# Patient Record
Sex: Female | Born: 1950 | Race: White | Hispanic: No | Marital: Married | Smoking: Never smoker
Health system: Southern US, Community
[De-identification: ages and names within clinical notes are randomized; demographics above are authoritative.]

## PROBLEM LIST (undated history)

## (undated) DIAGNOSIS — N200 Calculus of kidney: Secondary | ICD-10-CM

## (undated) DIAGNOSIS — J45909 Unspecified asthma, uncomplicated: Secondary | ICD-10-CM

## (undated) DIAGNOSIS — E039 Hypothyroidism, unspecified: Secondary | ICD-10-CM

## (undated) DIAGNOSIS — Z8709 Personal history of other diseases of the respiratory system: Secondary | ICD-10-CM

## (undated) DIAGNOSIS — J189 Pneumonia, unspecified organism: Secondary | ICD-10-CM

## (undated) DIAGNOSIS — Z8701 Personal history of pneumonia (recurrent): Secondary | ICD-10-CM

## (undated) DIAGNOSIS — F32A Depression, unspecified: Secondary | ICD-10-CM

## (undated) DIAGNOSIS — H409 Unspecified glaucoma: Secondary | ICD-10-CM

## (undated) DIAGNOSIS — E669 Obesity, unspecified: Secondary | ICD-10-CM

## (undated) DIAGNOSIS — K635 Polyp of colon: Secondary | ICD-10-CM

## (undated) DIAGNOSIS — M1990 Unspecified osteoarthritis, unspecified site: Secondary | ICD-10-CM

## (undated) DIAGNOSIS — Z8489 Family history of other specified conditions: Secondary | ICD-10-CM

## (undated) DIAGNOSIS — G2581 Restless legs syndrome: Secondary | ICD-10-CM

## (undated) DIAGNOSIS — R112 Nausea with vomiting, unspecified: Secondary | ICD-10-CM

## (undated) DIAGNOSIS — Z9989 Dependence on other enabling machines and devices: Secondary | ICD-10-CM

## (undated) DIAGNOSIS — F329 Major depressive disorder, single episode, unspecified: Secondary | ICD-10-CM

## (undated) DIAGNOSIS — E119 Type 2 diabetes mellitus without complications: Secondary | ICD-10-CM

## (undated) DIAGNOSIS — I10 Essential (primary) hypertension: Secondary | ICD-10-CM

## (undated) DIAGNOSIS — R51 Headache: Secondary | ICD-10-CM

## (undated) DIAGNOSIS — K760 Fatty (change of) liver, not elsewhere classified: Secondary | ICD-10-CM

## (undated) DIAGNOSIS — Z9889 Other specified postprocedural states: Secondary | ICD-10-CM

## (undated) DIAGNOSIS — M069 Rheumatoid arthritis, unspecified: Secondary | ICD-10-CM

## (undated) DIAGNOSIS — G4733 Obstructive sleep apnea (adult) (pediatric): Secondary | ICD-10-CM

## (undated) DIAGNOSIS — F419 Anxiety disorder, unspecified: Secondary | ICD-10-CM

## (undated) DIAGNOSIS — M722 Plantar fascial fibromatosis: Secondary | ICD-10-CM

## (undated) DIAGNOSIS — Z9109 Other allergy status, other than to drugs and biological substances: Secondary | ICD-10-CM

## (undated) DIAGNOSIS — Z87442 Personal history of urinary calculi: Secondary | ICD-10-CM

## (undated) DIAGNOSIS — G43909 Migraine, unspecified, not intractable, without status migrainosus: Secondary | ICD-10-CM

## (undated) HISTORY — DX: Unspecified osteoarthritis, unspecified site: M19.90

## (undated) HISTORY — DX: Headache: R51

## (undated) HISTORY — DX: Polyp of colon: K63.5

## (undated) HISTORY — DX: Dependence on other enabling machines and devices: Z99.89

## (undated) HISTORY — PX: BREAST BIOPSY: SHX20

## (undated) HISTORY — DX: Restless legs syndrome: G25.81

## (undated) HISTORY — DX: Unspecified glaucoma: H40.9

## (undated) HISTORY — PX: BUNIONECTOMY: SHX129

## (undated) HISTORY — PX: COLONOSCOPY W/ POLYPECTOMY: SHX1380

## (undated) HISTORY — DX: Plantar fascial fibromatosis: M72.2

## (undated) HISTORY — PX: SHOULDER SURGERY: SHX246

## (undated) HISTORY — PX: APPENDECTOMY: SHX54

## (undated) HISTORY — DX: Calculus of kidney: N20.0

## (undated) HISTORY — DX: Obstructive sleep apnea (adult) (pediatric): G47.33

## (undated) HISTORY — DX: Other allergy status, other than to drugs and biological substances: Z91.09

## (undated) HISTORY — PX: FINGER SURGERY: SHX640

## (undated) HISTORY — PX: LASIK: SHX215

## (undated) HISTORY — DX: Obesity, unspecified: E66.9

## (undated) HISTORY — PX: ABDOMINAL HYSTERECTOMY: SHX81

## (undated) HISTORY — PX: FOOT SURGERY: SHX648

## (undated) HISTORY — PX: KNEE ARTHROSCOPY W/ MENISCAL REPAIR: SHX1877

## (undated) HISTORY — PX: OTHER SURGICAL HISTORY: SHX169

## (undated) HISTORY — PX: TONSILLECTOMY: SUR1361

---

## 2005-01-07 ENCOUNTER — Ambulatory Visit: Payer: Self-pay | Admitting: General Practice

## 2005-02-27 ENCOUNTER — Ambulatory Visit: Payer: Self-pay | Admitting: General Surgery

## 2005-09-17 ENCOUNTER — Ambulatory Visit: Payer: Self-pay

## 2005-11-09 ENCOUNTER — Emergency Department: Payer: Self-pay | Admitting: Emergency Medicine

## 2006-01-14 ENCOUNTER — Ambulatory Visit: Payer: Self-pay | Admitting: Gastroenterology

## 2006-04-02 ENCOUNTER — Ambulatory Visit: Payer: Self-pay | Admitting: Internal Medicine

## 2007-04-06 ENCOUNTER — Ambulatory Visit: Payer: Self-pay | Admitting: General Surgery

## 2008-05-10 ENCOUNTER — Ambulatory Visit: Payer: Self-pay | Admitting: General Surgery

## 2008-05-29 ENCOUNTER — Ambulatory Visit: Payer: Self-pay | Admitting: General Surgery

## 2008-06-13 ENCOUNTER — Ambulatory Visit: Payer: Self-pay | Admitting: Specialist

## 2009-02-28 ENCOUNTER — Ambulatory Visit: Payer: Self-pay

## 2009-05-25 ENCOUNTER — Ambulatory Visit: Payer: Self-pay | Admitting: General Practice

## 2009-05-25 ENCOUNTER — Ambulatory Visit: Payer: Self-pay | Admitting: Cardiovascular Disease

## 2009-06-08 ENCOUNTER — Ambulatory Visit: Payer: Self-pay | Admitting: General Practice

## 2009-06-20 ENCOUNTER — Encounter: Payer: Self-pay | Admitting: General Practice

## 2009-07-03 ENCOUNTER — Encounter: Payer: Self-pay | Admitting: General Practice

## 2009-08-03 ENCOUNTER — Encounter: Payer: Self-pay | Admitting: General Practice

## 2009-08-27 ENCOUNTER — Ambulatory Visit: Payer: Self-pay | Admitting: Internal Medicine

## 2009-09-02 ENCOUNTER — Encounter: Payer: Self-pay | Admitting: General Practice

## 2009-10-17 ENCOUNTER — Ambulatory Visit: Payer: Self-pay | Admitting: General Surgery

## 2010-12-02 ENCOUNTER — Ambulatory Visit: Payer: Self-pay | Admitting: Internal Medicine

## 2010-12-04 ENCOUNTER — Ambulatory Visit: Payer: Self-pay | Admitting: General Surgery

## 2011-01-30 ENCOUNTER — Ambulatory Visit: Payer: Self-pay | Admitting: Internal Medicine

## 2011-02-13 ENCOUNTER — Ambulatory Visit: Payer: Self-pay | Admitting: General Practice

## 2011-02-20 ENCOUNTER — Ambulatory Visit: Payer: Self-pay | Admitting: General Practice

## 2011-02-26 ENCOUNTER — Ambulatory Visit: Payer: Self-pay | Admitting: Physician Assistant

## 2011-08-01 ENCOUNTER — Observation Stay: Payer: Self-pay | Admitting: Internal Medicine

## 2011-08-01 LAB — CBC
HGB: 14.6 g/dL (ref 12.0–16.0)
MCHC: 34 g/dL (ref 32.0–36.0)
Platelet: 175 10*3/uL (ref 150–440)
RBC: 4.67 10*6/uL (ref 3.80–5.20)
WBC: 7.4 10*3/uL (ref 3.6–11.0)

## 2011-08-01 LAB — CK TOTAL AND CKMB (NOT AT ARMC)
CK, Total: 58 U/L (ref 21–215)
CK-MB: 0.6 ng/mL (ref 0.5–3.6)

## 2011-08-01 LAB — COMPREHENSIVE METABOLIC PANEL
BUN: 9 mg/dL (ref 7–18)
Bilirubin,Total: 0.4 mg/dL (ref 0.2–1.0)
Chloride: 107 mmol/L (ref 98–107)
Creatinine: 0.61 mg/dL (ref 0.60–1.30)
EGFR (African American): >60
Glucose: 90 mg/dL (ref 65–99)
Osmolality: 285 (ref 275–301)
SGOT(AST): 47 U/L — ABNORMAL HIGH (ref 15–37)
SGPT (ALT): 69 U/L
Sodium: 144 mmol/L (ref 136–145)

## 2011-08-01 LAB — CK-MB: CK-MB: <0.5 ng/mL — ABNORMAL LOW (ref 0.5–3.6)

## 2011-08-02 LAB — LIPID PANEL
Cholesterol: 225 mg/dL — ABNORMAL HIGH (ref 0–200)
Ldl Cholesterol, Calc: 159 mg/dL — ABNORMAL HIGH (ref 0–100)
Triglycerides: 171 mg/dL (ref 0–200)

## 2011-08-02 LAB — TROPONIN I: Troponin-I: <0.02 ng/mL

## 2011-08-02 LAB — CK-MB: CK-MB: <0.5 ng/mL — ABNORMAL LOW (ref 0.5–3.6)

## 2011-11-18 ENCOUNTER — Ambulatory Visit: Payer: Self-pay | Admitting: General Practice

## 2011-12-09 ENCOUNTER — Ambulatory Visit: Payer: Self-pay | Admitting: General Surgery

## 2012-01-19 ENCOUNTER — Ambulatory Visit: Payer: Self-pay | Admitting: Gastroenterology

## 2012-03-18 ENCOUNTER — Ambulatory Visit: Payer: Self-pay | Admitting: Podiatry

## 2012-03-26 ENCOUNTER — Ambulatory Visit: Payer: Self-pay | Admitting: Gastroenterology

## 2012-08-10 ENCOUNTER — Telehealth: Payer: Self-pay | Admitting: *Deleted

## 2012-08-10 MED ORDER — PREDNISONE 5 MG PO TABS: ORAL_TABLET | ORAL | Status: DC

## 2012-08-10 MED ORDER — HYDROCODONE-IBUPROFEN 7.5-200 MG PO TABS: 1.0000 | ORAL_TABLET | Freq: Four times a day (QID) | ORAL | Status: DC | PRN

## 2012-08-10 NOTE — Telephone Encounter (Signed)
I called patient. The patient has had a headache for about 4 days. I'll give her a prednisone Dosepak, and a refill on her Vicoprofen.

## 2012-08-10 NOTE — Telephone Encounter (Signed)
Patient called stating she has a migraine that has lasted for three days and would like something called into her pharmacy.

## 2012-09-01 ENCOUNTER — Telehealth: Payer: Self-pay

## 2012-09-01 ENCOUNTER — Encounter: Payer: Self-pay | Admitting: Podiatry

## 2012-09-01 MED ORDER — ELETRIPTAN HYDROBROMIDE 40 MG PO TABS: 40.0000 mg | ORAL_TABLET | Freq: Two times a day (BID) | ORAL | Status: DC | PRN

## 2012-09-01 NOTE — Telephone Encounter (Signed)
The patient has been on Relpax in the past. I'll be happy to call in a prescription for her. The patient is being followed for migraine.

## 2012-09-01 NOTE — Telephone Encounter (Signed)
Patient wants to know if provider would like her to continue to take Relpax.  We last prescribed this medication in 2012.  If she should still take it, she would like rx sent to the pharmacy.  Thank you.

## 2012-09-02 ENCOUNTER — Encounter: Payer: Self-pay | Admitting: Podiatry

## 2012-10-03 ENCOUNTER — Encounter: Payer: Self-pay | Admitting: Podiatry

## 2012-10-12 ENCOUNTER — Other Ambulatory Visit: Payer: Self-pay | Admitting: Nurse Practitioner

## 2012-10-12 MED ORDER — BUTORPHANOL TARTRATE 10 MG/ML NA SOLN: 1.0000 | Freq: Four times a day (QID) | NASAL | Status: DC | PRN

## 2012-10-12 NOTE — Telephone Encounter (Signed)
Patient is calling to tell us she's ran out of her medication. She spells it Butorphanol tart rate.  Patient would like a call back asap.  [phone]

## 2012-10-13 ENCOUNTER — Ambulatory Visit: Payer: Self-pay | Admitting: Urology

## 2012-11-02 ENCOUNTER — Encounter: Payer: Self-pay | Admitting: Podiatry

## 2012-12-21 ENCOUNTER — Ambulatory Visit: Payer: Self-pay | Admitting: Internal Medicine

## 2012-12-24 ENCOUNTER — Ambulatory Visit: Payer: Self-pay | Admitting: Internal Medicine

## 2012-12-28 ENCOUNTER — Ambulatory Visit: Payer: Self-pay | Admitting: Internal Medicine

## 2013-02-15 ENCOUNTER — Telehealth: Payer: Self-pay | Admitting: Neurology

## 2013-02-15 ENCOUNTER — Encounter: Payer: Self-pay | Admitting: Nurse Practitioner

## 2013-02-15 MED ORDER — ELETRIPTAN HYDROBROMIDE 40 MG PO TABS: 40.0000 mg | ORAL_TABLET | Freq: Two times a day (BID) | ORAL | Status: DC | PRN

## 2013-02-15 NOTE — Telephone Encounter (Signed)
Rx Sent  

## 2013-02-18 ENCOUNTER — Encounter: Payer: Self-pay | Admitting: Nurse Practitioner

## 2013-02-18 ENCOUNTER — Ambulatory Visit (INDEPENDENT_AMBULATORY_CARE_PROVIDER_SITE_OTHER): Payer: PRIVATE HEALTH INSURANCE | Admitting: Nurse Practitioner

## 2013-02-18 ENCOUNTER — Encounter (INDEPENDENT_AMBULATORY_CARE_PROVIDER_SITE_OTHER): Payer: Self-pay

## 2013-02-18 VITALS — BP 150/88 | HR 74 | Ht 61.0 in | Wt 206.0 lb

## 2013-02-18 DIAGNOSIS — G43019 Migraine without aura, intractable, without status migrainosus: Secondary | ICD-10-CM | POA: Insufficient documentation

## 2013-02-18 MED ORDER — BUTORPHANOL TARTRATE 10 MG/ML NA SOLN: 1.0000 | Freq: Four times a day (QID) | NASAL | Status: DC | PRN

## 2013-02-18 NOTE — Patient Instructions (Signed)
Given informational migraine triggers Gabapentin 4 times daily to continue Stadol renewed, patient does not use Relpax is effective Followup in 6-8 months

## 2013-02-18 NOTE — Progress Notes (Signed)
I have read the note, and I agree with the clinical assessment and plan.  WILLIS,CHARLES KEITH   

## 2013-02-18 NOTE — Progress Notes (Signed)
[facility]  PATIENT: Jessica Cole DOB: 10/14/1950   REASON FOR VISIT:headaches   HISTORY OF PRESENT ILLNESS:Jessica Cole, 62 year old  right-handed white female with a history of obesity, diabetes, and migraine headaches returns for followup.  The patient indicates that she has less stress in her life since she has retired, and her headache frequency and severity has improved. The patient will have 2 or 3 headaches a month, and the headaches may last several hours, but she usually can improve the headache with hydrocodone and Stadol. The patient indicates that Relpax causes generalized achiness, and she generally does not use this medication. Imitrex also caused side effects. The patient indicates that she went off of her allergy medications, and her headaches worsened. She has never had allergy testing . The patient continues to have some issues with sleep, and she takes trazodone at night. The patient returns for an evaluation.  REVIEW OF SYSTEMS: Full 14 system review of systems performed and notable only for:  Constitutional: fatigue Cardiovascular: N/A  Ear/Nose/Throat: N/A  Skin: Easy bruising Eyes: N/A  Respiratory: N/A  Gastroitestinal: N/A  Hematology/Lymphatic: N/A  Endocrine: N/A Musculoskeletal:N/A  Allergy/Immunology: Allergies Neurological: Headache Psychiatric: Anxiety, decreased energy  ALLERGIES: Allergies  Allergen Reactions  . Seroquel [Quetiapine Fumarate]     HOME MEDICATIONS: Outpatient Prescriptions Prior to Visit  Medication Sig Dispense Refill  . aspirin 81 MG tablet Take 81 mg by mouth 2 (two) times daily.      . butorphanol (STADOL) 10 MG/ML nasal spray Place 1 spray into the nose every 6 (six) hours as needed (MUST LAST 28 DAYS).  5 mL  1  . calcium gluconate 500 MG tablet Take 500 mg by mouth daily.      . diclofenac sodium (VOLTAREN) 1 % GEL Apply topically 2 (two) times daily.      Marland Kitchen eletriptan (RELPAX) 40 MG tablet Take 1  tablet (40 mg total) by mouth 2 (two) times daily as needed for migraine.  8 tablet  0  . enalapril (VASOTEC) 5 MG tablet Take 5 mg by mouth daily.      Marland Kitchen estradiol (VIVELLE-DOT) 0.025 MG/24HR Place 1 patch onto the skin 2 (two) times a week.      . gabapentin (NEURONTIN) 300 MG capsule Take 300 mg by mouth 4 (four) times daily.      Marland Kitchen HYDROcodone-ibuprofen (VICOPROFEN) 7.5-200 MG per tablet Take 1 tablet by mouth every 6 (six) hours as needed for pain (Months last 28 days.).  40 tablet  3  . latanoprost (XALATAN) 0.005 % ophthalmic solution 1 drop at bedtime.      Marland Kitchen levothyroxine (SYNTHROID, LEVOTHROID) 125 MCG tablet Take 125 mcg by mouth daily before breakfast.      . Liraglutide (VICTOZA) 18 MG/3ML SOPN Inject 1.8 % into the skin daily.      . meloxicam (MOBIC) 15 MG tablet Take 15 mg by mouth as needed for pain.      Marland Kitchen nystatin (MYCOSTATIN/NYSTOP) 100000 UNIT/GM POWD Apply topically.      . Omega-3 Fatty Acids (FISH OIL ULTRA) 1000 MG CAPS Take 2 each by mouth daily.      . predniSONE (DELTASONE) 5 MG tablet Begin 6 tablets daily, taper by one tablet every day until off  21 tablet  0  . promethazine (PHENERGAN) 25 MG tablet Take 25 mg by mouth every 6 (six) hours as needed for nausea.      . Riboflavin 400 MG TABS 400 mg by  Intrauterine route daily.       . traZODone (DESYREL) 100 MG tablet Take 100 mg by mouth at bedtime.      . Triamcinolone Acetonide 55 MCG/ACT AERO Place 2 sprays into the nose.      . fexofenadine (ALLER-EASE) 180 MG tablet Take 180 mg by mouth at bedtime.      . Multiple Vitamin (MULTIVITAMIN) tablet Take 1 tablet by mouth daily.       No facility-administered medications prior to visit.    PAST MEDICAL HISTORY: Past Medical History  Diagnosis Date  . Headache(784.0)     PAST SURGICAL HISTORY: History reviewed. No pertinent past surgical history.  FAMILY HISTORY: Family History  Problem Relation Age of Onset  . Lung cancer Mother   . Congestive Heart  Failure Father   . Depression Sister   . Headache Maternal Grandfather   . Headache Maternal Uncle   . Diabetes    . Heart disease    . Hypertension      SOCIAL HISTORY: History   Social History  . Marital Status: Married    Spouse Name: N/A    Number of Children: 0  . Years of Education: 12   Occupational History  . Not on file.   Social History Main Topics  . Smoking status: Never Smoker   . Smokeless tobacco: Never Used  . Alcohol Use: No  . Drug Use: No  . Sexual Activity: Not on file   Other Topics Concern  . Not on file   Social History Narrative   Patient is married and lives with her husband.   Patient has a high school education.   Patient has no children.    Patient works for the [employer] Dept     PHYSICAL EXAM  Filed Vitals:   02/18/13 1402  Height: 5\' 1"  (1.549 m)  Weight: 206 lb (93.441 kg)   Body mass index is 38.94 kg/(m^2).  Generalized: Well developed, obese female in no acute distress  Head: normocephalic and atraumatic,. Oropharynx benign  Neck: Supple, no carotid bruits  Cardiac: Regular rate rhythm, no murmur  Neurological examination   Mentation: Alert oriented to time, place, history taking. Follows all commands speech and language fluent  Cranial nerve II-XII: Pupils were equal round reactive to light extraocular movements were full, visual field were full on confrontational test. Facial sensation and strength were normal. hearing was intact to finger rubbing bilaterally. Uvula tongue midline. head turning and shoulder shrug and were normal and symmetric.Tongue protrusion into cheek strength was normal. Motor: normal bulk and tone, full strength in the BUE, BLE, fine finger movements normal, no pronator drift. No focal weakness Coordination: finger-nose-finger, heel-to-shin bilaterally, no dysmetria Reflexes: Brachioradialis 2/2, biceps 2/2, triceps 2/2, patellar 2/2, Achilles 2/2, plantar responses were flexor bilaterally. Gait  and Station: Rising up from seated position without assistance, normal stance,  moderate stride, good arm swing, smooth turning, able to perform tiptoe, and heel walking without difficulty.   DIAGNOSTIC DATA (LABS, IMAGING, TESTING) -None to review   ASSESSMENT AND PLAN  62 y.o. year old female  has a past medical history of Headache(784.0). here for followup. She reports that Relpax was called in by Dr. Anne Hahn for her migraine however it was not effective and she needs a prescription for Stadol  Given information on migraine triggers Gabapentin 4 times daily to continue Stadol renewed, patient does not use Relpax not effective Followup in 6-8 months Nilda Riggs, [facility], [facility], APRN  [facility] [address] [place],  [address] [phone]

## 2013-02-21 ENCOUNTER — Other Ambulatory Visit: Payer: Self-pay | Admitting: Neurology

## 2013-02-21 MED ORDER — HYDROCODONE-IBUPROFEN 7.5-200 MG PO TABS: 1.0000 | ORAL_TABLET | Freq: Four times a day (QID) | ORAL | Status: DC | PRN

## 2013-02-22 ENCOUNTER — Other Ambulatory Visit: Payer: Self-pay

## 2013-02-22 MED ORDER — TRAZODONE HCL 100 MG PO TABS: 100.0000 mg | ORAL_TABLET | Freq: Every day | ORAL | Status: DC

## 2013-02-22 NOTE — Telephone Encounter (Signed)
Rx signed, called patient.  They would like it mailed.  Sent out today.

## 2013-03-23 ENCOUNTER — Ambulatory Visit (INDEPENDENT_AMBULATORY_CARE_PROVIDER_SITE_OTHER): Payer: PRIVATE HEALTH INSURANCE

## 2013-03-23 ENCOUNTER — Encounter: Payer: Self-pay | Admitting: Podiatry

## 2013-03-23 ENCOUNTER — Ambulatory Visit (INDEPENDENT_AMBULATORY_CARE_PROVIDER_SITE_OTHER): Payer: PRIVATE HEALTH INSURANCE | Admitting: Podiatry

## 2013-03-23 VITALS — BP 141/81 | HR 69 | Resp 16 | Ht 61.0 in | Wt 204.0 lb

## 2013-03-23 DIAGNOSIS — M79671 Pain in right foot: Secondary | ICD-10-CM

## 2013-03-23 DIAGNOSIS — M79609 Pain in unspecified limb: Secondary | ICD-10-CM

## 2013-03-23 DIAGNOSIS — M7660 Achilles tendinitis, unspecified leg: Secondary | ICD-10-CM

## 2013-03-23 NOTE — Progress Notes (Signed)
Ms. Milos presents today chief complaint of heel pain right. She states approximately a month ago we were jumping up and down on a board in the house 2 weeks later my foot began to her. She points to the posterior aspect of her right heel. Her right heel has been reconstructed with an Achilles tendon lysis so reviewed ago. She denies any other trauma to the foot.  Objective: Pulses are palpable right lower extremity. She has tenderness on palpation of the talar tendo Achilles at its insertion sites medially and laterally and just above the calcaneus. Graphic evaluation does demonstrate some calcification of a transferred flexor hallucis longus tendon and some thickening of the tendo Achilles. Soft tissue margins are inflamed.  Assessment: Achilles tendinitis right.  Plan: We discussed the etiology pathology conservative versus surgical therapies. At this point we are going to go ahead and per small amount of dexamethasone 2 mg into the anterior fat pad of the right Achilles area. I encouraged her to wear her Cam Walker and night splint to ice this and I will followup with her in 3-4 weeks.

## 2013-03-23 NOTE — Patient Instructions (Signed)
Plantar Fasciitis (Heel Spur Syndrome) with Rehab The plantar fascia is a fibrous, ligament-like, soft-tissue structure that spans the bottom of the foot. Plantar fasciitis is a condition that causes pain in the foot due to inflammation of the tissue. SYMPTOMS   Pain and tenderness on the underneath side of the foot.  Pain that worsens with standing or walking. CAUSES  Plantar fasciitis is caused by irritation and injury to the plantar fascia on the underneath side of the foot. Common mechanisms of injury include:  Direct trauma to bottom of the foot.  Damage to a small nerve that runs under the foot where the main fascia attaches to the heel bone.  Stress placed on the plantar fascia due to bone spurs. RISK INCREASES WITH:   Activities that place stress on the plantar fascia (running, jumping, pivoting, or cutting).  Poor strength and flexibility.  Improperly fitted shoes.  Tight calf muscles.  Flat feet.  Failure to warm-up properly before activity.  Obesity. PREVENTION  Warm up and stretch properly before activity.  Allow for adequate recovery between workouts.  Maintain physical fitness:  Strength, flexibility, and endurance.  Cardiovascular fitness.  Maintain a health body weight.  Avoid stress on the plantar fascia.  Wear properly fitted shoes, including arch supports for individuals who have flat feet. PROGNOSIS  If treated properly, then the symptoms of plantar fasciitis usually resolve without surgery. However, occasionally surgery is necessary. RELATED COMPLICATIONS   Recurrent symptoms that may result in a chronic condition.  Problems of the lower back that are caused by compensating for the injury, such as limping.  Pain or weakness of the foot during push-off following surgery.  Chronic inflammation, scarring, and partial or complete fascia tear, occurring more often from repeated injections. TREATMENT  Treatment initially involves the use of  ice and medication to help reduce pain and inflammation. The use of strengthening and stretching exercises may help reduce pain with activity, especially stretches of the Achilles tendon. These exercises may be performed at home or with a therapist. Your caregiver may recommend that you use heel cups of arch supports to help reduce stress on the plantar fascia. Occasionally, corticosteroid injections are given to reduce inflammation. If symptoms persist for greater than 6 months despite non-surgical (conservative), then surgery may be recommended.  MEDICATION   If pain medication is necessary, then nonsteroidal anti-inflammatory medications, such as aspirin and ibuprofen, or other minor pain relievers, such as acetaminophen, are often recommended.  Do not take pain medication within 7 days before surgery.  Prescription pain relievers may be given if deemed necessary by your caregiver. Use only as directed and only as much as you need.  Corticosteroid injections may be given by your caregiver. These injections should be reserved for the most serious cases, because they may only be given a certain number of times. HEAT AND COLD  Cold treatment (icing) relieves pain and reduces inflammation. Cold treatment should be applied for 10 to 15 minutes every 2 to 3 hours for inflammation and pain and immediately after any activity that aggravates your symptoms. Use ice packs or massage the area with a piece of ice (ice massage).  Heat treatment may be used prior to performing the stretching and strengthening activities prescribed by your caregiver, physical therapist, or athletic trainer. Use a heat pack or soak the injury in warm water. SEEK IMMEDIATE MEDICAL CARE IF:  Treatment seems to offer no benefit, or the condition worsens.  Any medications produce adverse side effects. EXERCISES RANGE   OF MOTION (ROM) AND STRETCHING EXERCISES - Plantar Fasciitis (Heel Spur Syndrome) These exercises may help you  when beginning to rehabilitate your injury. Your symptoms may resolve with or without further involvement from your physician, physical therapist or athletic trainer. While completing these exercises, remember:   Restoring tissue flexibility helps normal motion to return to the joints. This allows healthier, less painful movement and activity.  An effective stretch should be held for at least 30 seconds.  A stretch should never be painful. You should only feel a gentle lengthening or release in the stretched tissue. RANGE OF MOTION - Toe Extension, Flexion  Sit with your right / left leg crossed over your opposite knee.  Grasp your toes and gently pull them back toward the top of your foot. You should feel a stretch on the bottom of your toes and/or foot.  Hold this stretch for __________ seconds.  Now, gently pull your toes toward the bottom of your foot. You should feel a stretch on the top of your toes and or foot.  Hold this stretch for __________ seconds. Repeat __________ times. Complete this stretch __________ times per day.  RANGE OF MOTION - Ankle Dorsiflexion, Active Assisted  Remove shoes and sit on a chair that is preferably not on a carpeted surface.  Place right / left foot under knee. Extend your opposite leg for support.  Keeping your heel down, slide your right / left foot back toward the chair until you feel a stretch at your ankle or calf. If you do not feel a stretch, slide your bottom forward to the edge of the chair, while still keeping your heel down.  Hold this stretch for __________ seconds. Repeat __________ times. Complete this stretch __________ times per day.  STRETCH  Gastroc, Standing  Place hands on wall.  Extend right / left leg, keeping the front knee somewhat bent.  Slightly point your toes inward on your back foot.  Keeping your right / left heel on the floor and your knee straight, shift your weight toward the wall, not allowing your back to  arch.  You should feel a gentle stretch in the right / left calf. Hold this position for __________ seconds. Repeat __________ times. Complete this stretch __________ times per day. STRETCH  Soleus, Standing  Place hands on wall.  Extend right / left leg, keeping the other knee somewhat bent.  Slightly point your toes inward on your back foot.  Keep your right / left heel on the floor, bend your back knee, and slightly shift your weight over the back leg so that you feel a gentle stretch deep in your back calf.  Hold this position for __________ seconds. Repeat __________ times. Complete this stretch __________ times per day. STRETCH  Gastrocsoleus, Standing  Note: This exercise can place a lot of stress on your foot and ankle. Please complete this exercise only if specifically instructed by your caregiver.   Place the ball of your right / left foot on a step, keeping your other foot firmly on the same step.  Hold on to the wall or a rail for balance.  Slowly lift your other foot, allowing your body weight to press your heel down over the edge of the step.  You should feel a stretch in your right / left calf.  Hold this position for __________ seconds.  Repeat this exercise with a slight bend in your right / left knee. Repeat __________ times. Complete this stretch __________ times per day.    STRENGTHENING EXERCISES - Plantar Fasciitis (Heel Spur Syndrome)  These exercises may help you when beginning to rehabilitate your injury. They may resolve your symptoms with or without further involvement from your physician, physical therapist or athletic trainer. While completing these exercises, remember:   Muscles can gain both the endurance and the strength needed for everyday activities through controlled exercises.  Complete these exercises as instructed by your physician, physical therapist or athletic trainer. Progress the resistance and repetitions only as guided. STRENGTH - Towel  Curls  Sit in a chair positioned on a non-carpeted surface.  Place your foot on a towel, keeping your heel on the floor.  Pull the towel toward your heel by only curling your toes. Keep your heel on the floor.  If instructed by your physician, physical therapist or athletic trainer, add ____________________ at the end of the towel. Repeat __________ times. Complete this exercise __________ times per day. STRENGTH - Ankle Inversion  Secure one end of a rubber exercise band/tubing to a fixed object (table, pole). Loop the other end around your foot just before your toes.  Place your fists between your knees. This will focus your strengthening at your ankle.  Slowly, pull your big toe up and in, making sure the band/tubing is positioned to resist the entire motion.  Hold this position for __________ seconds.  Have your muscles resist the band/tubing as it slowly pulls your foot back to the starting position. Repeat __________ times. Complete this exercises __________ times per day.  Document Released: 04/21/2005 Document Revised: 07/14/2011 Document Reviewed: 08/03/2008 [employer] Patient Information 2014 [employer]. Plantar Fasciitis Plantar fasciitis is a common condition that causes foot pain. It is soreness (inflammation) of the band of tough fibrous tissue on the bottom of the foot that runs from the heel bone (calcaneus) to the ball of the foot. The cause of this soreness may be from excessive standing, poor fitting shoes, running on hard surfaces, being overweight, having an abnormal walk, or overuse (this is common in runners) of the painful foot or feet. It is also common in aerobic exercise dancers and ballet dancers. SYMPTOMS  Most people with plantar fasciitis complain of:  Severe pain in the morning on the bottom of their foot especially when taking the first steps out of bed. This pain recedes after a few minutes of walking.  Severe pain is experienced also during walking  following a long period of inactivity.  Pain is worse when walking barefoot or up stairs DIAGNOSIS   Your caregiver will diagnose this condition by examining and feeling your foot.  Special tests such as X-rays of your foot, are usually not needed. PREVENTION   Consult a sports medicine professional before beginning a new exercise program.  Walking programs offer a good workout. With walking there is a lower chance of overuse injuries common to runners. There is less impact and less jarring of the joints.  Begin all new exercise programs slowly. If problems or pain develop, decrease the amount of time or distance until you are at a comfortable level.  Wear good shoes and replace them regularly.  Stretch your foot and the heel cords at the back of the ankle (Achilles tendon) both before and after exercise.  Run or exercise on even surfaces that are not hard. For example, asphalt is better than pavement.  Do not run barefoot on hard surfaces.  If using a treadmill, vary the incline.  Do not continue to workout if you have foot or joint   problems. Seek professional help if they do not improve. HOME CARE INSTRUCTIONS   Avoid activities that cause you pain until you recover.  Use ice or cold packs on the problem or painful areas after working out.  Only take over-the-counter or prescription medicines for pain, discomfort, or fever as directed by your caregiver.  Soft shoe inserts or athletic shoes with air or gel sole cushions may be helpful.  If problems continue or become more severe, consult a sports medicine caregiver or your own health care provider. Cortisone is a potent anti-inflammatory medication that may be injected into the painful area. You can discuss this treatment with your caregiver. MAKE SURE YOU:   Understand these instructions.  Will watch your condition.  Will get help right away if you are not doing well or get worse. Document Released: 01/14/2001 Document  Revised: 07/14/2011 Document Reviewed: 03/15/2008 [employer] Patient Information 2014 [employer].  

## 2013-04-20 ENCOUNTER — Ambulatory Visit: Payer: Self-pay | Admitting: General Practice

## 2013-05-02 ENCOUNTER — Other Ambulatory Visit: Payer: Self-pay | Admitting: Neurology

## 2013-05-02 MED ORDER — HYDROCODONE-IBUPROFEN 7.5-200 MG PO TABS: 1.0000 | ORAL_TABLET | Freq: Four times a day (QID) | ORAL | Status: DC | PRN

## 2013-05-02 NOTE — Telephone Encounter (Signed)
Needs RX for Hydrocodone 

## 2013-05-02 NOTE — Telephone Encounter (Signed)
Patient requests Rx be mailed. Rx will be put in mail today.

## 2013-06-06 ENCOUNTER — Telehealth: Payer: Self-pay | Admitting: Neurology

## 2013-06-06 MED ORDER — PREDNISONE 5 MG PO TABS: ORAL_TABLET | ORAL | Status: DC

## 2013-06-06 NOTE — Telephone Encounter (Signed)
NEEDS RX CALLED IN FOR MIGRAINE--STADOL HELPS AS LONG AS SLEEPS BUT WHEN WAKES UP STILL HAS HEADACHE--[facility] [address] [place]

## 2013-06-06 NOTE — Telephone Encounter (Signed)
I tried to call the patient, was unable to contact her, could not leave a message. I will call back later.

## 2013-06-06 NOTE — Telephone Encounter (Signed)
I called patient. The patient has had cycles of headaches lasting 3 or 4 days for the last 2 weeks. I will call in a prednisone Dosepak for the patient to use if she gets into another severe cycle of headache.

## 2013-06-08 ENCOUNTER — Ambulatory Visit: Payer: Self-pay | Admitting: General Practice

## 2013-06-08 LAB — BASIC METABOLIC PANEL
ANION GAP: 3 — AB (ref 7–16)
BUN: 11 mg/dL (ref 7–18)
CALCIUM: 9.6 mg/dL (ref 8.5–10.1)
CO2: 29 mmol/L (ref 21–32)
CREATININE: 0.64 mg/dL (ref 0.60–1.30)
Chloride: 106 mmol/L (ref 98–107)
EGFR (African American): >60
EGFR (Non-African Amer.): >60
Glucose: 93 mg/dL (ref 65–99)
Osmolality: 275 (ref 275–301)
Potassium: 4.1 mmol/L (ref 3.5–5.1)
SODIUM: 138 mmol/L (ref 136–145)

## 2013-06-24 ENCOUNTER — Ambulatory Visit: Payer: Self-pay | Admitting: General Practice

## 2013-08-03 ENCOUNTER — Ambulatory Visit: Payer: Self-pay | Admitting: Internal Medicine

## 2013-08-15 ENCOUNTER — Other Ambulatory Visit: Payer: Self-pay

## 2013-08-15 MED ORDER — TRAZODONE HCL 100 MG PO TABS: 100.0000 mg | ORAL_TABLET | Freq: Every day | ORAL | Status: DC

## 2013-08-19 ENCOUNTER — Ambulatory Visit: Payer: PRIVATE HEALTH INSURANCE | Admitting: Nurse Practitioner

## 2013-08-29 DIAGNOSIS — N200 Calculus of kidney: Secondary | ICD-10-CM

## 2013-08-29 DIAGNOSIS — I10 Essential (primary) hypertension: Secondary | ICD-10-CM | POA: Insufficient documentation

## 2013-08-29 DIAGNOSIS — Z87442 Personal history of urinary calculi: Secondary | ICD-10-CM | POA: Insufficient documentation

## 2013-08-29 DIAGNOSIS — J309 Allergic rhinitis, unspecified: Secondary | ICD-10-CM | POA: Insufficient documentation

## 2013-08-29 DIAGNOSIS — G4733 Obstructive sleep apnea (adult) (pediatric): Secondary | ICD-10-CM | POA: Insufficient documentation

## 2013-08-29 DIAGNOSIS — G43909 Migraine, unspecified, not intractable, without status migrainosus: Secondary | ICD-10-CM | POA: Insufficient documentation

## 2013-08-29 DIAGNOSIS — R809 Proteinuria, unspecified: Secondary | ICD-10-CM | POA: Insufficient documentation

## 2013-08-29 DIAGNOSIS — G4730 Sleep apnea, unspecified: Secondary | ICD-10-CM | POA: Insufficient documentation

## 2013-08-29 DIAGNOSIS — E785 Hyperlipidemia, unspecified: Secondary | ICD-10-CM | POA: Insufficient documentation

## 2013-09-01 ENCOUNTER — Other Ambulatory Visit: Payer: Self-pay

## 2013-09-01 MED ORDER — BUTORPHANOL TARTRATE 10 MG/ML NA SOLN: 1.0000 | Freq: Four times a day (QID) | NASAL | Status: DC | PRN

## 2013-09-01 NOTE — Telephone Encounter (Signed)
Rx signed and faxed.

## 2013-09-29 ENCOUNTER — Ambulatory Visit (INDEPENDENT_AMBULATORY_CARE_PROVIDER_SITE_OTHER): Payer: PRIVATE HEALTH INSURANCE | Admitting: Nurse Practitioner

## 2013-09-29 ENCOUNTER — Encounter: Payer: Self-pay | Admitting: Nurse Practitioner

## 2013-09-29 ENCOUNTER — Encounter (INDEPENDENT_AMBULATORY_CARE_PROVIDER_SITE_OTHER): Payer: Self-pay

## 2013-09-29 VITALS — BP 151/72 | HR 70 | Ht 60.0 in | Wt 203.0 lb

## 2013-09-29 DIAGNOSIS — G4700 Insomnia, unspecified: Secondary | ICD-10-CM | POA: Insufficient documentation

## 2013-09-29 DIAGNOSIS — F32A Depression, unspecified: Secondary | ICD-10-CM | POA: Insufficient documentation

## 2013-09-29 DIAGNOSIS — G43019 Migraine without aura, intractable, without status migrainosus: Secondary | ICD-10-CM

## 2013-09-29 DIAGNOSIS — F329 Major depressive disorder, single episode, unspecified: Secondary | ICD-10-CM | POA: Insufficient documentation

## 2013-09-29 MED ORDER — TRAZODONE HCL 150 MG PO TABS: 150.0000 mg | ORAL_TABLET | Freq: Every day | ORAL | Status: DC
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
## 2013-09-29 NOTE — Patient Instructions (Signed)
Increase trazodone to 150 daily at hs Continue gabapentin, try 2 tabs twice daily Continue Relpax Followup in 6-8 months

## 2013-09-29 NOTE — Progress Notes (Signed)
I have read the note, and I agree with the clinical assessment and plan.  Charles K Willis   

## 2013-11-10 ENCOUNTER — Other Ambulatory Visit: Payer: Self-pay

## 2013-11-10 MED ORDER — BUTORPHANOL TARTRATE 10 MG/ML NA SOLN: 1.0000 | Freq: Four times a day (QID) | NASAL | Status: DC | PRN

## 2013-11-10 NOTE — Telephone Encounter (Signed)
Rx signed and faxed.

## 2013-11-15 DIAGNOSIS — M179 Osteoarthritis of knee, unspecified: Secondary | ICD-10-CM | POA: Insufficient documentation

## 2013-11-15 DIAGNOSIS — M1710 Unilateral primary osteoarthritis, unspecified knee: Secondary | ICD-10-CM | POA: Insufficient documentation

## 2013-11-27 ENCOUNTER — Telehealth: Payer: Self-pay

## 2013-11-27 NOTE — Telephone Encounter (Signed)
[employer] notified us they have approved our request for coverage on Stadol effective until 05/04/2014 Ref # [id number]

## 2014-01-03 ENCOUNTER — Ambulatory Visit: Payer: Self-pay | Admitting: Internal Medicine

## 2014-01-23 DIAGNOSIS — M1990 Unspecified osteoarthritis, unspecified site: Secondary | ICD-10-CM | POA: Insufficient documentation

## 2014-02-08 ENCOUNTER — Telehealth: Payer: Self-pay | Admitting: *Deleted

## 2014-02-08 NOTE — Telephone Encounter (Signed)
Calling patient to r/s appointment time on 04/03/14, patient was r/s to 11:30 am with NP LL.

## 2014-02-17 ENCOUNTER — Ambulatory Visit: Payer: Self-pay | Admitting: Internal Medicine

## 2014-03-06 ENCOUNTER — Other Ambulatory Visit: Payer: Self-pay | Admitting: Neurology

## 2014-03-06 MED ORDER — HYDROCODONE-IBUPROFEN 7.5-200 MG PO TABS: 1.0000 | ORAL_TABLET | Freq: Four times a day (QID) | ORAL | Status: DC | PRN

## 2014-03-06 NOTE — Telephone Encounter (Addendum)
I called the patient to let them know their Rx for Hydrocodone was ready for pickup. Patient was instructed to bring Photo ID.  Patient states that she will pick it up at her appointment on 04/03/14.

## 2014-03-06 NOTE — Telephone Encounter (Signed)
Patient requesting refill of hydrocodone script, please call when ready for pick up. Patient states that she usually has this script mailed to her since she lives far away, advised patient that we do not mail these types of prescriptions. Please call her back and let her know.

## 2014-03-06 NOTE — Telephone Encounter (Signed)
Request entered, forwarded to provider for review.  

## 2014-03-15 ENCOUNTER — Telehealth: Payer: Self-pay | Admitting: Neurology

## 2014-03-15 NOTE — Telephone Encounter (Signed)
Left message for patient regarding rescheduling 04/03/14 appointment per Larita FifeLynn leaving, scheduled patient for first available with Dr. Anne HahnWillis on 04/14/14.

## 2014-03-22 ENCOUNTER — Encounter: Payer: Self-pay | Admitting: Neurology

## 2014-03-28 ENCOUNTER — Encounter: Payer: Self-pay | Admitting: Neurology

## 2014-04-03 ENCOUNTER — Ambulatory Visit: Payer: Self-pay | Admitting: Nurse Practitioner

## 2014-04-14 ENCOUNTER — Encounter: Payer: Self-pay | Admitting: Neurology

## 2014-04-14 ENCOUNTER — Ambulatory Visit (INDEPENDENT_AMBULATORY_CARE_PROVIDER_SITE_OTHER): Payer: PRIVATE HEALTH INSURANCE | Admitting: Neurology

## 2014-04-14 VITALS — BP 133/80 | HR 64 | Ht 60.0 in | Wt 201.0 lb

## 2014-04-14 DIAGNOSIS — G43019 Migraine without aura, intractable, without status migrainosus: Secondary | ICD-10-CM

## 2014-04-14 MED ORDER — QUETIAPINE FUMARATE 100 MG PO TABS: 100.0000 mg | ORAL_TABLET | Freq: Every day | ORAL | Status: DC

## 2014-04-14 MED ORDER — HYDROCODONE-IBUPROFEN 7.5-200 MG PO TABS: 1.0000 | ORAL_TABLET | Freq: Four times a day (QID) | ORAL | Status: DC | PRN

## 2014-04-14 NOTE — Patient Instructions (Addendum)
With the gabapentin 300 mg, taper by one capsule every week until discontinued. Stop the trazodone and we will go on Seroquel at night for sleep and for headache. Call if the dose of the trazodone is not adequate.  Migraine Headache A migraine headache is an intense, throbbing pain on one or both sides of your head. A migraine can last for 30 minutes to several hours. CAUSES  The exact cause of a migraine headache is not always known. However, a migraine may be caused when nerves in the brain become irritated and release chemicals that cause inflammation. This causes pain. Certain things may also trigger migraines, such as:  Alcohol.  Smoking.  Stress.  Menstruation.  Aged cheeses.  Foods or drinks that contain nitrates, glutamate, aspartame, or tyramine.  Lack of sleep.  Chocolate.  Caffeine.  Hunger.  Physical exertion.  Fatigue.  Medicines used to treat chest pain (nitroglycerine), birth control pills, estrogen, and some blood pressure medicines. SIGNS AND SYMPTOMS  Pain on one or both sides of your head.  Pulsating or throbbing pain.  Severe pain that prevents daily activities.  Pain that is aggravated by any physical activity.  Nausea, vomiting, or both.  Dizziness.  Pain with exposure to bright lights, loud noises, or activity.  General sensitivity to bright lights, loud noises, or smells. Before you get a migraine, you may get warning signs that a migraine is coming (aura). An aura may include:  Seeing flashing lights.  Seeing bright spots, halos, or zigzag lines.  Having tunnel vision or blurred vision.  Having feelings of numbness or tingling.  Having trouble talking.  Having muscle weakness. DIAGNOSIS  A migraine headache is often diagnosed based on:  Symptoms.  Physical exam.  A CT scan or MRI of your head. These imaging tests cannot diagnose migraines, but they can help rule out other causes of headaches. TREATMENT Medicines may be  given for pain and nausea. Medicines can also be given to help prevent recurrent migraines.  HOME CARE INSTRUCTIONS  Only take over-the-counter or prescription medicines for pain or discomfort as directed by your health care provider. The use of long-term narcotics is not recommended.  Lie down in a dark, quiet room when you have a migraine.  Keep a journal to find out what may trigger your migraine headaches. For example, write down:  What you eat and drink.  How much sleep you get.  Any change to your diet or medicines.  Limit alcohol consumption.  Quit smoking if you smoke.  Get 7-9 hours of sleep, or as recommended by your health care provider.  Limit stress.  Keep lights dim if bright lights bother you and make your migraines worse. SEEK IMMEDIATE MEDICAL CARE IF:   Your migraine becomes severe.  You have a fever.  You have a stiff neck.  You have vision loss.  You have muscular weakness or loss of muscle control.  You start losing your balance or have trouble walking.  You feel faint or pass out.  You have severe symptoms that are different from your first symptoms. MAKE SURE YOU:   Understand these instructions.  Will watch your condition.  Will get help right away if you are not doing well or get worse. Document Released: 04/21/2005 Document Revised: 09/05/2013 Document Reviewed: 12/27/2012 [facility] Patient Information 2015 [place]. This information is not intended to replace advice given to you by your health care provider. Make sure you discuss any questions you have with your health care provider.

## 2014-04-14 NOTE — Progress Notes (Signed)
Reason for visit: Migraine headache  Jessica Cole is an 63 y.o. female  History of present illness:  Jessica Cole is a 63 year old right-handed white female with a history of migraine headaches. The patient is now retired, and she indicates that this has helped the intensity of her headaches, not the frequency. She may have 10 or 12 headaches a month, some of her headaches last up to 3 days. The patient is on gabapentin taking 600 mg twice daily without much benefit. She is on trazodone at night to help her sleep, but she still does not rest well. She has sleep apnea, and she is on CPAP. The patient has had increasing headaches when she went off of her allergy medications, but she is now back on her allergy drugs. She takes hydrocodone if needed when the pain is severe. She has prednisone to take if the headache becomes prolonged. She returns to this office for an evaluation.  Past Medical History  Diagnosis Date  . Headache(784.0)   . Obesity   . OSA on CPAP   . Environmental allergies   . Renal calculi   . Plantar fasciitis     right  . Glaucoma   . Renal calculi   . Colon polyps   . Degenerative arthritis     Past Surgical History  Procedure Laterality Date  . Abdominal hysterectomy    . Tonsillectomy    . Appendectomy    . Breast biopsy    . Arthroscopic surgery, knee Left     Family History  Problem Relation Age of Onset  . Lung cancer Mother   . Congestive Heart Failure Father   . Depression Sister   . Rheum arthritis Sister   . Headache Maternal Grandfather   . Migraines Maternal Grandfather   . Headache Maternal Uncle   . Diabetes    . Heart disease    . Hypertension      Social history:  reports that she has never smoked. She has never used smokeless tobacco. She reports that she does not drink alcohol or use illicit drugs.    Allergies  Allergen Reactions  . Dexamethasone   . Seroquel [Quetiapine Fumarate]     Medications:  Current Outpatient  Prescriptions on File Prior to Visit  Medication Sig Dispense Refill  . aspirin 81 MG tablet Take 81 mg by mouth 2 (two) times daily.    . Azelastine-Fluticasone (DYMISTA) 137-50 MCG/ACT SUSP Place into the nose 2 (two) times daily.    . B Complex Vitamins (VITAMIN B COMPLEX PO) Take by mouth daily.    . butorphanol (STADOL) 10 MG/ML nasal spray Place 1 spray into the nose every 6 (six) hours as needed (MUST LAST 28 DAYS). 5 mL 3  . calcium gluconate 500 MG tablet Take 500 mg by mouth daily.    . diclofenac sodium (VOLTAREN) 1 % GEL Apply topically 2 (two) times daily.    . DULoxetine (CYMBALTA) 30 MG capsule Take 30 mg by mouth daily.    . enalapril (VASOTEC) 5 MG tablet Take 5 mg by mouth daily.    Marland Kitchen. estradiol (VIVELLE-DOT) 0.025 MG/24HR Place 1 patch onto the skin 2 (two) times a week.    . gabapentin (NEURONTIN) 300 MG capsule Take 600 mg by mouth 2 (two) times daily.     Marland Kitchen. latanoprost (XALATAN) 0.005 % ophthalmic solution 1 drop at bedtime.    Marland Kitchen. levothyroxine (SYNTHROID, LEVOTHROID) 125 MCG tablet Take 125 mcg by mouth daily  before breakfast.    . nystatin (MYCOSTATIN/NYSTOP) 100000 UNIT/GM POWD Apply topically.    . Omega-3 Fatty Acids (FISH OIL ULTRA) 1000 MG CAPS Take 2 each by mouth daily.    . promethazine (PHENERGAN) 25 MG tablet Take 25 mg by mouth every 6 (six) hours as needed for nausea.    Marland Kitchen. eletriptan (RELPAX) 40 MG tablet Take 40 mg by mouth as needed for migraine or headache. One tablet by mouth at onset of headache. May repeat in 2 hours if headache persists or recurs.    . meloxicam (MOBIC) 15 MG tablet Take 15 mg by mouth as needed for pain.    . predniSONE (DELTASONE) 5 MG tablet Take 5 mg by mouth as needed. Began taking 6 tablets daily, taper by one tablet daily until off the medication.     No current facility-administered medications on file prior to visit.    ROS:  Out of a complete 14 system review of symptoms, the patient complains only of the following  symptoms, and all other reviewed systems are negative.  Ringing in the ears, drooling Eye itching, eye redness, light sensitivity Cough, shortness of breath Constipation Restless legs, apnea, snoring Environmental allergies Joint pain, joint swelling, back pain, neck stiffness Bruising easily Headache, numbness Anxiety  Blood pressure 133/80, pulse 64, height 5' (1.524 m), weight 201 lb (91.173 kg).  Physical Exam  General: The patient is alert and cooperative at the time of the examination. The patient is markedly obese.  Skin: No significant peripheral edema is noted.   Neurologic Exam  Mental status: The patient is oriented x 3.  Cranial nerves: Facial symmetry is present. Speech is normal, no aphasia or dysarthria is noted. Extraocular movements are full. Visual fields are full.  Motor: The patient has good strength in all 4 extremities.  Sensory examination: Soft touch sensation is symmetric on the face, arms, and legs.  Coordination: The patient has good finger-nose-finger and heel-to-shin bilaterally.  Gait and station: The patient has a normal gait. Tandem gait is normal. Romberg is negative. No drift is seen.  Reflexes: Deep tendon reflexes are symmetric.   Assessment/Plan:  1. Migraine headache  2. Sleep apnea on CPAP  The patient continues to have relatively frequent headaches. The patient be tapered down off of gabapentin by 300 mg a week until she is off the drug. The patient will come off of the trazodone, and go on Seroquel for her sleep and for her migraine. She was given a prescription for hydrocodone. She will follow-up in 4-5 months. She will contact me if the Seroquel dosing is not adequate.  Marlan Palau. Keith Jessica Heldman MD 04/14/2014 8:24 AM  [facility] [address] [place], [place] [id number]  Phone [phone] Fax [phone]

## 2014-05-30 ENCOUNTER — Telehealth: Payer: Self-pay | Admitting: *Deleted

## 2014-05-30 MED ORDER — QUETIAPINE FUMARATE 100 MG PO TABS: 150.0000 mg | ORAL_TABLET | Freq: Every day | ORAL | Status: DC

## 2014-05-30 MED ORDER — BUTORPHANOL TARTRATE 10 MG/ML NA SOLN: 1.0000 | Freq: Four times a day (QID) | NASAL | Status: DC | PRN

## 2014-05-30 NOTE — Telephone Encounter (Signed)
Patient requesting something called in for her headache and also would like to see if the Seroquel 100 mg can be taken up states it is not working for her, patient states that she has bad migraine and leg cramps. Patient would also like to know if she can have one more bottle of butorphanol (STADOL) 10 MG/ML nasal spray [[id number]

## 2014-05-30 NOTE — Telephone Encounter (Signed)
I called the patient. She is having a recent increase in the headache frequency and severity. She took a prednisone Dosepak with some benefit. She is having increasing problems with restless leg syndrome at night, she is taking clonazepam for this. I will go up on the Seroquel taking 150 mg at night. The patient will be given another prescription for the Stadol nasal spray.

## 2014-05-31 ENCOUNTER — Telehealth: Payer: Self-pay | Admitting: Neurology

## 2014-05-31 NOTE — Telephone Encounter (Signed)
Patient is calling because [facility] [place] states there is no Rx there for generic Stadol. Please resend. Thank you.

## 2014-05-31 NOTE — Telephone Encounter (Signed)
Patient called: Apparently Stadol was sent into pharmacy 05/30/14 but pharmacy never received.  I spoke with Pharmacy -- [facility] [place],  [phone]) --- she filled script for 2 bottles 04/29/14 prescribed by Arvilla Marketarol Martin.   On 05/22/14 filled script prescribed by Dr. Annamarie Majoroomey.  Pharmacy thinks 05/30/14  stadol did not send through as controlled substance and was never received   Given multiple practices prescribing and escalating use, I did not feel comfortable refilling.

## 2014-05-31 NOTE — Telephone Encounter (Signed)
Rx has been resent 

## 2014-06-06 ENCOUNTER — Other Ambulatory Visit: Payer: Self-pay | Admitting: *Deleted

## 2014-06-06 MED ORDER — HYDROCODONE-IBUPROFEN 7.5-200 MG PO TABS: 1.0000 | ORAL_TABLET | Freq: Four times a day (QID) | ORAL | Status: DC | PRN

## 2014-06-06 NOTE — Telephone Encounter (Signed)
Patient wants Hydrocodone 7.5-200 written out and mailed to her. Patient is still having migraine headaches please advise.

## 2014-06-07 ENCOUNTER — Telehealth: Payer: Self-pay

## 2014-06-07 NOTE — Telephone Encounter (Signed)
Called patient and informed Rx ready for pick up at front desk. Patient verbalized understanding.  

## 2014-06-13 DIAGNOSIS — M5416 Radiculopathy, lumbar region: Secondary | ICD-10-CM | POA: Insufficient documentation

## 2014-06-13 DIAGNOSIS — M5136 Other intervertebral disc degeneration, lumbar region: Secondary | ICD-10-CM | POA: Insufficient documentation

## 2014-06-13 DIAGNOSIS — M5116 Intervertebral disc disorders with radiculopathy, lumbar region: Secondary | ICD-10-CM | POA: Insufficient documentation

## 2014-06-20 ENCOUNTER — Ambulatory Visit: Payer: Self-pay | Admitting: Physical Medicine and Rehabilitation

## 2014-07-12 ENCOUNTER — Encounter
Admit: 2014-07-12 | Disposition: A | Discharge status: Home / Self Care | Payer: Self-pay | Attending: Physical Medicine and Rehabilitation | Admitting: Physical Medicine and Rehabilitation

## 2014-08-04 ENCOUNTER — Telehealth: Payer: Self-pay | Admitting: Neurology

## 2014-08-04 ENCOUNTER — Encounter
Admit: 2014-08-04 | Disposition: A | Discharge status: Home / Self Care | Payer: Self-pay | Attending: Physical Medicine and Rehabilitation | Admitting: Physical Medicine and Rehabilitation

## 2014-08-04 NOTE — Telephone Encounter (Signed)
Patient requesting refill for Rx butorphanol (STADOL) 10 MG/ML [facility] spray [facility]  Has had a migraine for the last 2 days.  Please call and advise.

## 2014-08-04 NOTE — Telephone Encounter (Signed)
I called the pharmacy.  Spoke with Marcelino DusterMichelle.  She said the patient has refills on file, and is likely looking at an old prescription bottle.  They will fill Rx today.  I called the patient back.  She is aware.

## 2014-08-14 ENCOUNTER — Ambulatory Visit: Payer: Medicare Other | Admitting: Podiatry

## 2014-08-14 ENCOUNTER — Other Ambulatory Visit: Payer: Self-pay | Admitting: Neurology

## 2014-08-14 MED ORDER — HYDROCODONE-IBUPROFEN 7.5-200 MG PO TABS: 1.0000 | ORAL_TABLET | Freq: Four times a day (QID) | ORAL | Status: DC | PRN

## 2014-08-14 NOTE — Telephone Encounter (Signed)
Patient requesting refill of the Vicoprofen. She is totally out of the medication. Her best call back is [phone].

## 2014-08-22 ENCOUNTER — Ambulatory Visit: Payer: PRIVATE HEALTH INSURANCE | Admitting: Neurology

## 2014-08-23 ENCOUNTER — Encounter: Payer: Self-pay | Admitting: Neurology

## 2014-08-23 ENCOUNTER — Ambulatory Visit (INDEPENDENT_AMBULATORY_CARE_PROVIDER_SITE_OTHER): Payer: Medicare Other | Admitting: Neurology

## 2014-08-23 VITALS — BP 150/81 | HR 74 | Ht 61.0 in | Wt 203.6 lb

## 2014-08-23 DIAGNOSIS — G2581 Restless legs syndrome: Secondary | ICD-10-CM | POA: Diagnosis not present

## 2014-08-23 DIAGNOSIS — G43019 Migraine without aura, intractable, without status migrainosus: Secondary | ICD-10-CM | POA: Diagnosis not present

## 2014-08-23 DIAGNOSIS — G4700 Insomnia, unspecified: Secondary | ICD-10-CM | POA: Diagnosis not present

## 2014-08-23 HISTORY — DX: Restless legs syndrome: G25.81

## 2014-08-23 MED ORDER — PRAMIPEXOLE DIHYDROCHLORIDE 0.25 MG PO TABS: 0.2500 mg | ORAL_TABLET | Freq: Every day | ORAL | Status: DC

## 2014-08-23 NOTE — Patient Instructions (Signed)
Restless Legs Syndrome Restless legs syndrome is a movement disorder. It may also be called a sensorimotor disorder.  CAUSES  No one knows what specifically causes restless legs syndrome, but it tends to run in families. It is also more common in people with low iron, in pregnancy, in people who need dialysis, and those with nerve damage (neuropathy).Some medications may make restless legs syndrome worse.Those medications include drugs to treat high blood pressure, some heart conditions, nausea, colds, allergies, and depression. SYMPTOMS Symptoms include uncomfortable sensations in the legs. These leg sensations are worse during periods of inactivity or rest. They are also worse while sitting or lying down. Individuals that have the disorder describe sensations in the legs that feel like:  Pulling.  Drawing.  Crawling.  Worming.  Boring.  Tingling.  Pins and needles.  Prickling.  Pain. The sensations are usually accompanied by an overwhelming urge to move the legs. Sudden muscle jerks may also occur. Movement provides temporary relief from the discomfort. In rare cases, the arms may also be affected. Symptoms may interfere with going to sleep (sleep onset insomnia). Restless legs syndrome may also be related to periodic limb movement disorder (PLMD). PLMD is another more common motor disorder. It also causes interrupted sleep. The symptoms from PLMD usually occur most often when you are awake. TREATMENT  Treatment for restless legs syndrome is symptomatic. This means that the symptoms are treated.   Massage and cold compresses may provide temporary relief.  Walk, stretch, or take a cold or hot bath.  Get regular exercise and a good night's sleep.  Avoid caffeine, alcohol, nicotine, and medications that can make it worse.  Do activities that provide mental stimulation like discussions, needlework, and video games. These may be helpful if you are not able to walk or stretch. Some  medications are effective in relieving the symptoms. However, many of these medications have side effects. Ask your caregiver about medications that may help your symptoms. Correcting iron deficiency may improve symptoms for some patients. Document Released: 04/11/2002 Document Revised: 09/05/2013 Document Reviewed: 07/18/2010 Exit[employer] Patient Information 2015 [employer], [employer]. This information is not intended to replace advice given to you by your health care provider. Make sure you discuss any questions you have with your health care provider.  

## 2014-08-23 NOTE — Progress Notes (Signed)
Reason for visit: Headache  Jessica Cole is an 64 y.o. female  History of present illness:  Jessica Cole is a 64 year old right-handed white female with a history of frequent headaches. The patient was having 10 or 12 headache days a month, but she indicates that she has been placed on Percocet 7.5/325 tablets taking 2 at night. The patient indicates that she sleeps well, and she does not have headache. The patient has developed some problems with restless leg syndrome, she has been placed back on her clonazepam taking 1 mg at night. The patient indicates this is not completely effective. She has been getting multiple opiate medications from multiple doctors. She has been getting the Percocet from her primary care physician, and she was getting Stadol prescriptions through this office and through Dr. Annamarie Major. The patient was also getting hydrocodone through this office. The patient indicates that her primary care doctor will be stopping her Percocet prescription in the near future. She returns this office for further evaluation.  Past Medical History  Diagnosis Date  . Headache(784.0)   . Obesity   . OSA on CPAP   . Environmental allergies   . Renal calculi   . Plantar fasciitis     right  . Glaucoma   . Renal calculi   . Colon polyps   . Degenerative arthritis   . RLS (restless legs syndrome) 08/23/2014    Past Surgical History  Procedure Laterality Date  . Abdominal hysterectomy    . Tonsillectomy    . Appendectomy    . Breast biopsy    . Arthroscopic surgery, knee Left     Family History  Problem Relation Age of Onset  . Lung cancer Mother   . Congestive Heart Failure Father   . Depression Sister   . Rheum arthritis Sister   . Headache Maternal Grandfather   . Migraines Maternal Grandfather   . Headache Maternal Uncle   . Diabetes    . Heart disease    . Hypertension      Social history:  reports that she has never smoked. She has never used smokeless  tobacco. She reports that she does not drink alcohol or use illicit drugs.    Allergies  Allergen Reactions  . Dexamethasone     Medications:  Prior to Admission medications   Medication Sig Start Date End Date Taking? Authorizing Provider  aspirin 81 MG tablet Take 81 mg by mouth 2 (two) times daily.   Yes Historical Provider, MD  Azelastine-Fluticasone (DYMISTA) 137-50 MCG/ACT SUSP Place into the nose 2 (two) times daily.   Yes Historical Provider, MD  B Complex Vitamins (VITAMIN B COMPLEX PO) Take by mouth daily.   Yes Historical Provider, MD  butorphanol (STADOL) 10 MG/ML nasal spray Place 1 spray into the nose every 6 (six) hours as needed (MUST LAST 28 DAYS). 05/30/14  Yes York Spaniel, MD  calcium gluconate 500 MG tablet Take 500 mg by mouth daily.   Yes Historical Provider, MD  clonazePAM (KLONOPIN) 1 MG tablet Take 1 mg by mouth at bedtime. 10/17/10  Yes Historical Provider, MD  diclofenac sodium (VOLTAREN) 1 % GEL Apply topically 2 (two) times daily.   Yes Historical Provider, MD  DULoxetine (CYMBALTA) 30 MG capsule Take 30 mg by mouth daily.   Yes Historical Provider, MD  eletriptan (RELPAX) 40 MG tablet Take 40 mg by mouth as needed for migraine or headache. One tablet by mouth at onset of headache. May repeat in  2 hours if headache persists or recurs.   Yes Historical Provider, MD  enalapril (VASOTEC) 5 MG tablet Take 5 mg by mouth daily.   Yes Historical Provider, MD  estradiol (VIVELLE-DOT) 0.025 MG/24HR Place 1 patch onto the skin 2 (two) times a week.   Yes Historical Provider, MD  etodolac (LODINE) 400 MG tablet Take 400 mg by mouth 2 (two) times daily. 02/21/14 02/21/15 Yes Historical Provider, MD  fluticasone Aleda Grana(FLONASE) 50 MCG/ACT nasal spray  04/12/14  Yes Historical Provider, MD  gabapentin (NEURONTIN) 300 MG capsule Take 1,200 mg by mouth at bedtime.    Yes Historical Provider, MD  HYDROcodone-ibuprofen (VICOPROFEN) 7.5-200 MG per tablet Take 1 tablet by mouth every 6  (six) hours as needed. 08/14/14  Yes York Spanielharles K Willis, MD  latanoprost (XALATAN) 0.005 % ophthalmic solution 1 drop at bedtime.   Yes Historical Provider, MD  levothyroxine (SYNTHROID, LEVOTHROID) 125 MCG tablet Take 125 mcg by mouth daily before breakfast.   Yes Historical Provider, MD  nystatin (MYCOSTATIN/NYSTOP) 100000 UNIT/GM POWD Apply topically.   Yes Historical Provider, MD  Omega-3 Fatty Acids (FISH OIL ULTRA) 1000 MG CAPS Take 2 each by mouth daily.   Yes Historical Provider, MD  oxyCODONE-acetaminophen (PERCOCET) 7.5-325 MG per tablet Take 2 tablets by mouth daily as needed for severe pain.   Yes Historical Provider, MD  promethazine (PHENERGAN) 25 MG tablet Take 25 mg by mouth every 6 (six) hours as needed for nausea.   Yes Historical Provider, MD  QUEtiapine (SEROQUEL) 100 MG tablet Take 1.5 tablets (150 mg total) by mouth at bedtime. 05/30/14  Yes York Spanielharles K Willis, MD    ROS:  Out of a complete 14 system review of symptoms, the patient complains only of the following symptoms, and all other reviewed systems are negative.  Runny nose Drooling Eye redness Cough Flushing Constipation Restless legs, insomnia, sleep apnea, snoring Joint pain, back pain, achy muscles, neck stiffness Moles, itching Bruising easily Headache, numbness  Blood pressure 150/81, pulse 74, height 5\' 1"  (1.549 m), weight 203 lb 9.6 oz (92.352 kg).  Physical Exam  General: The patient is alert and cooperative at the time of the examination. The patient is markedly obese.  Skin: No significant peripheral edema is noted.   Neurologic Exam  Mental status: The patient is alert and oriented x 3 at the time of the examination. The patient has apparent normal recent and remote memory, with an apparently normal attention span and concentration ability.   Cranial nerves: Facial symmetry is present. Speech is normal, no aphasia or dysarthria is noted. Extraocular movements are full. Visual fields are  full.  Motor: The patient has good strength in all 4 extremities.  Sensory examination: Soft touch sensation is symmetric on the face, arms, and legs.  Coordination: The patient has good finger-nose-finger and heel-to-shin bilaterally.  Gait and station: The patient has a normal gait. Tandem gait is normal. Romberg is negative. No drift is seen.  Reflexes: Deep tendon reflexes are symmetric.   Assessment/Plan:  1. History of headache  2. Restless leg syndrome  3. Obesity  The patient is getting multiple opiate medications from multiple doctors. At this time, we will stop prescribing Stadol and hydrocodone for her. The patient is getting clonazepam through her primary care physician. She continues to report problems with restless leg syndrome. We will check iron and ferritin levels, and place her on Mirapex at night taking 0.25 mg. The patient may have increased headaches coming off of the Percocet.  The patient has signed a narcotic agreement today. She will follow-up in 6 months, or sooner if needed. She will contact our office if the Mirapex dose needs to be adjusted.  Marlan Palau MD 08/23/2014 7:11 PM  [facility] [address] [place], [place] [address]  Phone [phone] Fax [phone]

## 2014-08-24 ENCOUNTER — Telehealth: Payer: Self-pay

## 2014-08-24 LAB — IRON: Iron: 106 ug/dL (ref 27–139)

## 2014-08-24 LAB — FERRITIN: FERRITIN: 263 ng/mL — AB (ref 15–150)

## 2014-08-24 NOTE — Telephone Encounter (Signed)
error 

## 2014-08-25 ENCOUNTER — Telehealth: Payer: Self-pay

## 2014-08-25 NOTE — Telephone Encounter (Signed)
-----   Message from York Spanielharles K Willis, MD sent at 08/24/2014 11:59 AM EDT -----  The blood work results are unremarkable. Please call the patient.  ----- Message -----    From: [facility] Lab Results In Interface    Sent: 08/24/2014   7:50 AM      To: York Spanielharles K Willis, MD

## 2014-08-25 NOTE — Telephone Encounter (Signed)
Spoke to patient. Relayed results. 

## 2014-08-26 NOTE — Op Note (Signed)
PATIENT NAME:  Jessica Cole, Jessica Cole MR#:  [id number] DATE OF BIRTH:  Oct 20, 1950  DATE OF PROCEDURE:  06/24/2013  PREOPERATIVE DIAGNOSIS: Internal derangement of the left knee.   POSTOPERATIVE DIAGNOSES:  1.  Tear of the medial meniscus, left knee.  2.  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   PROCEDURE PERFORMED: Left knee arthroscopy, partial medial meniscectomy and chondroplasty.   SURGEON: Illene Labrador. Hooten.   ANESTHESIA: General.   ESTIMATED BLOOD LOSS: Minimal.   FLUIDS REPLACED: 1000 mL of crystalloid.   DRAINS: None.   TOURNIQUET TIME: Not used.   INDICATIONS FOR SURGERY: The patient is a 64 year old female who has been seen for complaints of persistent left knee pain. She localized most of the pain along the medial aspect of the knee. MRI was consistent with meniscal pathology. After discussion of the risks and benefits of surgical intervention, the patient expressed understanding of the risks, benefits, and agreed with plans for surgical intervention.   PROCEDURE IN DETAIL: The patient was brought to the operating room and, after adequate general anesthesia was achieved, a tourniquet was placed on the patient's left thigh and leg was placed in a leg holder. All bony prominences were well padded. The patient's left knee and leg were cleaned and prepped with alcohol and DuraPrep draped in the usual sterile fashion. A "timeout" was performed as per usual protocol. The anticipated portal sites were injected with 0.25% Marcaine with epinephrine. An anterolateral portal was created and a cannula was inserted. The scope was inserted and the knee was distended with fluid using the Stryker pump. The scope was advanced down the medial gutter and into the medial compartment of the knee. Under visualization with the scope, an anteromedial portal was created and hook probe was inserted. Inspection of the medial compartment demonstrated a complex tear of the medial meniscus  primarily along the medial aspect of the meniscus. The tear was debrided using meniscal punches and a 4.5 mm shaver. Transition zone both along the anterior and posterior margins of the tear site were contoured using a combination of 4.5 mm shaver and the 50 degrees ArthroCare wand. The remaining rim of meniscus was visualized and probed and felt to be stable. Inspection of the articular surface of the medial compartment showed the articular surface to be in good condition. The scope was then advanced into the intracondylar region. The anterior cruciate ligament was visualized and probed and felt to be stable. The scope was removed from the anterolateral portal and reinserted via the anteromedial portal so as to better visualize the lateral compartment. The lateral meniscus was visualized and probed and felt to be stable. The articular surface of the lateral compartment was in good condition. Finally, the scope was positioned so as to visualize the patellofemoral compartment. Good patellar tracking was noted. There was an area of grade III chondromalacia involving the intercondylar groove and this was debrided and contoured using the ArthroCare wand.   The knee was irrigated with copious amounts of fluid and then suctioned dry. The anterolateral portal was reapproximated using 3-0 nylon. A combination of 0.25% Marcaine with epinephrine and 4 mg morphine was injected via the scope. The scope was removed and the anteromedial portal was reapproximated using 3-0 nylon. A sterile dressing was applied followed by application of an ice wrap. The patient tolerated the procedure well. She was transported to the recovery room in stable condition.    ____________________________ Illene Labrador. Angie Fava., MD jph:dp D: 06/25/2013 11:43:30 ET T: 06/25/2013 12:00:46  ET JOB#: [id number]  cc: Illene LabradorJames P. Angie FavaHooten Jr., MD, <Dictator> Illene LabradorJAMES P Angie FavaHOOTEN JR MD ELECTRONICALLY SIGNED 06/26/2013 21:59

## 2014-08-27 NOTE — Discharge Summary (Signed)
PATIENT NAME:  Jessica Cole, Jessica Cole MR#:  [id number] DATE OF BIRTH:  [birth year]  DATE OF ADMISSION:  08/01/2011 DATE OF DISCHARGE:  08/03/2011  DIAGNOSES AT TIME OF DISCHARGE: 1. Chest pain, noncardiac in origin.  2. Type II diabetes.  3. Hypothyroidism.  4. Migraine headaches.  5. Anxiety.  6. Hyperlipidemia.   CHIEF COMPLAINT: Chest pain.   HISTORY OF PRESENT ILLNESS: Steward RosGail Klees is a 64 year old female with a history of type II diabetes, hypertension, hyperlipidemia, and hypothyroidism who presented to the Emergency Room complaining of chest pain after she came back from the grocery store. She stated the pain was across her chest and also under the breast. She felt nauseous, did not have any shortness of breath. The patient reportedly had a stress test a few years back that was normal.   PAST MEDICAL HISTORY:  1. Hypertension. 2. Type II diabetes. 3. Hyperlipidemia. 4. Hypothyroidism. 5. Migraine headaches.   PHYSICAL EXAMINATION: She was afebrile. Temperature 97.4, pulse 76, respirations 20, blood pressure 168/94, repeat blood pressure 140/86, oxygen sat 98% on room air. She was not in distress. HEENT normocephalic, atraumatic. NECK no JVD. No carotid bruits. LUNGS were clear to auscultation. HEART S1, S2. ABDOMEN soft, nontender. EXTREMITIES no edema. NEUROLOGIC nonfocal.   LABORATORY, DIAGNOSTIC, AND RADIOLOGICAL DATA: Glucose 66. Total CK 58. MB 0.6. Sodium 144, potassium 3.7, chloride 107, bicarb 27, BUN 9, creatinine 0.61, glucose 90. LFTs with AST 47, ALT 69. WBC count 7.4. Troponin less than 0.02. EKG shows sinus rhythm. No significant ST-Cole changes.   HOSPITAL COURSE: The patient was admitted to the Obs Unit. She underwent a Myoview stress test that was essentially negative. She was also seen by cardiologist, Dr. Gwen PoundsKowalski. The patient did not have any further episodes of chest pain and was ambulated. She was started on low dose beta-blocker, i.e., metoprolol 25 mg p.o. b.i.d. She was  continued on her home meds, except for Verapamil and discharged in stable condition on the following medications.   DISCHARGE MEDICATIONS:  1. Relpax 40 mg as needed for migraine. 2. Levothyroxine 112 mcg a day.  3. Glipizide 10 mg once a day.  4. Gabapentin 300 mg 4 capsules at bedtime.  5. Enalapril 5 mg a day.  6. Clonazepam 1 mg at bedtime p.r.n.  7. Vitamin B complex.  8. Xanax 0.25 mg p.o. daily p.r.n.  9. Aspirin 81 mg a day. 10. Victoza 18 mg subcutaneous daily.  11. Metoprolol 25 mg p.o. b.i.d.   FOLLOW-UP: She will be followed up by her primary care doctor, Dr. Graciela HusbandsKlein, in 1 to 2 weeks' time. The patient has been advised to call us with any questions or concerns.   ____________________________ Barbette ReichmannVishwanath Husayn Reim, MD vh:drc D: 08/03/2011 12:25:57 ET Cole: 08/04/2011 14:42:45 ET JOB#: [id number]  cc: Barbette ReichmannVishwanath Casimira Sutphin, MD, <Dictator> Barbette ReichmannVISHWANATH Toula Miyasaki MD ELECTRONICALLY SIGNED 08/12/2011 13:10

## 2014-08-27 NOTE — H&P (Signed)
PATIENT NAME:  Jessica Cole, Jessica Cole MR#:  [id number] DATE OF BIRTH:  [birth year]  DATE OF ADMISSION:  08/01/2011  PRIMARY DOCTOR:  Daniel Nones, III, MD ER PHYSICIAN:  Dr Darnelle Catalan.    CHIEF COMPLAINT: Chest pain.   HISTORY OF PRESENT ILLNESS: This is a 64 year old female with hypertension, diabetes, hyperlipidemia, hypothyroidism, severe menopausal symptoms, came in because of chest pain started this afternoon after she came back from grocery store.  Chest pain mainly in the medial and also across the chest and also under the breast. The pain around 3 to 4/10 in severity.  She felt nauseous and diaphoretic but did not have any cough, no trouble breathing, and no aggravating or relieving factors. The patient came to the ER because of chest pain. The patient right now says that she does not have chest pain, given aspirin in the ER.  She had a stress test a few years ago, and according to the patient it was not normal; and she had a cardiac catheterization but medical management was advised. The patient saw Dr. Daniel Nones last Tuesday.  PAST MEDICAL HISTORY:  Significant for:  1. Hypertension.  2. Diabetes.  3. Hyperlipidemia.  4. Hypothyroidism.  5. History of migraines.  6. Severe menopausal symptoms.   ALLERGIES: No known allergies.   SOCIAL HISTORY: No smoking, no drinking, no drugs.   FAMILY HISTORY: Significant for hypertension and also coronary artery disease for father at the age of 4s and also died at 67 because of congestive heart failure. The patient has strong history of  heart disease on paternal side.   PAST SURGICAL HISTORY: Significant for bunion surgery and also right rotator cuff surgery.  Significant for partial hysterectomy and appendectomy.   MEDICATIONS: The patient takes:  1. WelChol 625 mg 2 tablets 3 times a day.   2. Voltaren 1% topical as needed.   3. Vivelle Dot patch 0.025 every 24, placed twice a week on Tuesday and Friday.   4. Relpax 40 mg with indomethacin as needed  for migraines.  5. Promethazine as needed.  6. Progesterone 2% cream between legs a.m. and p.m.  7. Patanol eye drops.  8. Nasal mist.  9. Lexapro 10 mg at bedtime.  10. Levothyroxine.  11. Glipizide 5 mg takes 2 tablets in the morning and 2 in the evening.   12. Patient also takes gabapentin 300 mg 2 tablets at night.  13. Fish oil 1 gram at night.   1 15. The patient is also on Klonopin 1 mg at night as needed.  16. Aspirin 81 mg daily.   17. B complex as needed.  18. Patient says that she is also on Victoza but does not remember the dose, and the husband went to get the medicine dosages.  We need to update it as soon as we get it.   REVIEW OF SYSTEMS:  CONSTITUTIONAL: Denies any fever or fatigue. EYES: No blurred vision. ENT: No tinnitus. No epistaxis. No difficulty swallowing. RESPIRATORY: Has no trouble breathing, no chronic obstructive pulmonary disease. CARDIOVASCULAR: Has chest pain today. No orthopnea. No PND. The patient has no palpitations. GASTROINTESTINAL: No nausea. No vomiting. No abdominal pain. GENITOURINARY: No dysuria. ENDOCRINE: Has diabetes and hypothyroidism, but denies any nocturia or heat or cold intolerance. INTEGUMENT: No skin rashes. MUSCULOSKELETAL: Has joint pains.  NEUROLOGIC: Has migraine history and takes medications.  PSYCHIATRIC: Has history of anxiety.   PHYSICAL EXAMINATION:  VITAL SIGNS: Temperature 97.4, pulse 76, respirations 20, blood pressure 168/94. Repeat blood pressure  is 140/86, saturations are 98% on room air.   GENERAL: Alert, awake, oriented female, not in distress, answering questions appropriately.   HEENT: Head atraumatic, normocephalic. Pupils are equally, reacting to light. Extraocular movements are intact. ENT: No tympanic membrane congestion. No turbinate hypertrophy. No oropharyngeal erythema.   NECK: Normal range of motion. No JVD. No carotid bruit. The patient has no lymphadenopathy.   RESPIRATORY: Clear to auscultation. No wheeze.  No rales.   ABDOMEN: Soft, nontender, nondistended. Bowel sounds present.   CARDIOVASCULAR: S1, S2 regular. No murmurs. PMI nondisplaced. Good pedal pulses and femoral pulses. No extremity edema.   MUSCULOSKELETAL: Power 5/5 in upper and lower extremities.   SKIN: No skin rashes.   LYMPH NODES: No lymphadenopathy in cervical or axillary region.   NEUROLOGIC: The patient is oriented to time, place, and person. No focal neurological deficits. Sensory and motor systems are intact.   PSYCHIATRIC: Oriented to time, place, person.  Judgment is good.   LABORATORY TESTS:  Blood glucose 66.  At home when she checked blood glucose it was 109. Chest x-ray showed no acute cardiopulmonary abnormality. CK total is 58, CPK-MB 0.6.  Electrolytes: Sodium is 144, potassium 3.7, chloride 107, bicarbonate 27, BUN is 9, creatinine 0.61, glucose 90. Liver functions: Slight elevation of AST to 47 and ALT 69. WBC 7.4, hemoglobin 14.6, hematocrit 42.9, platelets 175,000. Troponin less than 0.02. EKG showed normal sinus rhythm with no ST-T changes and 70 beats per minute.   ASSESSMENT AND PLAN:  681. A 64 year old female with chest pain with multiple risk factors of hypertension, diabetes, hyperlipidemia, hypothyroidism, and also the age and strong family history is that high risk for having acute coronary syndrome. The patient is going to be placed on observation. Continue to monitor CK and troponins 2 more times along with a stress test in the morning. The patient will be on aspirin, beta blockers, and also nitrates along with ACE inhibitors. The patient says that she cannot take statins, her LFTs we will go up, so we will leave that up to the primary doctor.  2. The patient has diabetes. She is on glipizide and Victoza.  Continue glipizide and get the dose of Victoza restarted.    3. Check sliding scale with coverage and also ADA diet.  4. Hot flashes. She is on Vivelle Dot patch.  Continue that.   5. Hypothyroidism.  Continue Synthroid.   6. Migraines. She is on Relpax as needed.    TOTAL TIME SPENT ON HISTORY AND PHYSICAL: About 60 minutes.  [facility].    ____________________________ Katha HammingSnehalatha Faythe Heitzenrater, MD sk:vtd D: 08/01/2011 16:39:14 ET T: 08/02/2011 07:54:31 ET JOB#: [id number]  cc: Katha HammingSnehalatha Kreed Kauffman, MD, <Dictator> Lynnea FerrierBert J. Klein III, MD Katha HammingSNEHALATHA Bowen Goyal MD ELECTRONICALLY SIGNED 08/03/2011 16:32

## 2014-08-28 ENCOUNTER — Ambulatory Visit (INDEPENDENT_AMBULATORY_CARE_PROVIDER_SITE_OTHER): Payer: Medicare Other | Admitting: Podiatry

## 2014-08-28 ENCOUNTER — Encounter: Payer: Self-pay | Admitting: Podiatry

## 2014-08-28 ENCOUNTER — Ambulatory Visit (INDEPENDENT_AMBULATORY_CARE_PROVIDER_SITE_OTHER): Payer: Medicare Other

## 2014-08-28 VITALS — BP 150/83 | HR 73 | Resp 16

## 2014-08-28 DIAGNOSIS — M722 Plantar fascial fibromatosis: Secondary | ICD-10-CM

## 2014-08-28 DIAGNOSIS — M7662 Achilles tendinitis, left leg: Secondary | ICD-10-CM | POA: Diagnosis not present

## 2014-08-28 DIAGNOSIS — M7661 Achilles tendinitis, right leg: Secondary | ICD-10-CM | POA: Diagnosis not present

## 2014-08-28 MED ORDER — DICLOFENAC SODIUM 1 % TD GEL: 4.0000 g | Freq: Two times a day (BID) | TRANSDERMAL | Status: DC

## 2014-08-28 NOTE — Patient Instructions (Signed)

## 2014-08-28 NOTE — Progress Notes (Signed)
She presents today for follow-up of her Achilles tendinitis and plantar fasciitis. She states that is hurting a little bit more on the medial side. She denies any trauma.  Objective: Vital signs are stable alert and oriented 3. Pulses are palpable bilateral. Neurologic sensorium is intact bilateral. She has minimal tenderness on palpation of the Achilles tendon reconstructed right heel. No pain on palpation medial calcaneal tubercle of the right heel. Left heel does demonstrate mild tenderness on palpation of the medial calcaneal tubercle.  Assessment: Well-healing Achilles tendinitis right plantar fasciitis left.  Plan: Suggested that she continue use of the night splint. Also suggested Voltaren gel.

## 2014-09-04 ENCOUNTER — Encounter: Payer: Medicare Other | Admitting: Physical Therapy

## 2014-09-13 ENCOUNTER — Encounter: Payer: Medicare Other | Admitting: Physical Therapy

## 2014-09-14 ENCOUNTER — Ambulatory Visit: Payer: Medicare Other | Attending: Physical Medicine and Rehabilitation | Admitting: Physical Therapy

## 2014-09-14 DIAGNOSIS — M25562 Pain in left knee: Secondary | ICD-10-CM

## 2014-09-14 DIAGNOSIS — M5416 Radiculopathy, lumbar region: Secondary | ICD-10-CM | POA: Diagnosis present

## 2014-09-14 DIAGNOSIS — M79661 Pain in right lower leg: Secondary | ICD-10-CM | POA: Insufficient documentation

## 2014-09-14 DIAGNOSIS — M79662 Pain in left lower leg: Secondary | ICD-10-CM | POA: Insufficient documentation

## 2014-09-14 DIAGNOSIS — M25561 Pain in right knee: Secondary | ICD-10-CM

## 2014-09-15 ENCOUNTER — Encounter: Payer: Self-pay | Admitting: Physical Therapy

## 2014-09-15 NOTE — Therapy (Signed)
[facility] [facility] [address] [place], [place], [address] Phone: [phone]   Fax:  [phone]  Physical Therapy Treatment  Patient Details  Name: Jessica HaggisGail Thacker Cole MRN: [id number] Date of Birth: [birth year] Referring Provider:  Lynnea FerrierKlein, Bert J III, MD  Encounter Date: 09/14/2014      PT End of Session - 09/14/14 1615    Visit Number 5   Number of Visits 16   Date for PT Re-Evaluation 09/28/14   Authorization Type 5   Authorization Time Period 10   PT Start Time 1530   PT Stop Time 1615   PT Time Calculation (min) 45 min   Activity Tolerance Patient tolerated treatment well   Behavior [facility] assessed/performed      Past Medical History  Diagnosis Date  . Headache(784.0)   . Obesity   . OSA on CPAP   . Environmental allergies   . Renal calculi   . Plantar fasciitis     right  . Glaucoma   . Renal calculi   . Colon polyps   . Degenerative arthritis   . RLS (restless legs syndrome) 08/23/2014    Past Surgical History  Procedure Laterality Date  . Abdominal hysterectomy    . Tonsillectomy    . Appendectomy    . Breast biopsy    . Arthroscopic surgery, knee Left     There were no vitals filed for this visit.  Visit Diagnosis:  Pain in joint, lower leg, left  Pain in joint involving right lower leg      Subjective Assessment - 09/14/14 1540    Subjective Patient reports sheis still having pain in lower legs in calves primarily and the symptoms seem to be improving at times. Her concern is that the tingling, restless feelings are now during the day and not only at night. She denies back pain. When she is moving around the sensation is better and worse with static positions such as sitting or lying down.   Limitations Sitting;Standing   Patient Stated Goals patient would lik to decreased leg pain  and be able to perform normal activity without leg symptoms bothering her   Currently in Pain? Yes   Pain Score 2    Pain Location Leg   Pain Orientation Other (Comment)  both LE's below knees   Pain Descriptors / Indicators Aching;Tingling;Restless;Spasm   Pain Type Chronic pain   Pain Onset More than a month ago   Pain Frequency Intermittent   Pain Relieving Factors unsure, uses TENS for symptom control   Effect of Pain on Daily Activities difficulty with all activities and sitting            [facility] PT Assessment - 09/15/14 0001    Assessment   Medical Diagnosis radiculopatht   Onset Date 05/05/14   Next MD Visit 09/28/2014   Observation/Other Assessments   Other Surveys  --  Modified Oswestry low back pain questionaire: 50% (severe)     Objective: Gait: ambulating without assistive device WNL's Strength: both LE's WFL's for all major muscle groups Palpation: no point tenderness noted in calf muscles  Treatment: Highvolt estim. Applied by therapist (4 electrodes) 2 to each LE calf region with patient in supine lying with both LE's supported by pillow and with moist heat applied to same during treatment with results of decreased sensations in both LE's and able to stand and walk with mild symptoms as compared to pre treatment.  Verbally  reviewed exercises that patient is performing at home with good results of patient knowing her exercise program        PT Education - 09/14/14 1600    Education provided Yes   Education Details insstructed in pain control, re assessed exercises given previously   Person(s) Educated Patient   Methods Explanation   Comprehension Verbalized understanding             PT Long Term Goals - 09/14/14 1620    PT LONG TERM GOAL #1   Title Patient will be independent with home program for self management exercises, posture awareness in 4 weeks. Ongoing (08/31/2014)   Time 4   Period Weeks   Status On-going   PT LONG TERM GOAL #2   Title Patient will demonstrate improved perceived disability on Modified Oswestry  to 30% or less in 4 weeks. deferred, ongoing  (08/31/2014)   Time 4   Period Weeks   Status On-going               Plan - 09/14/14 1615    Clinical Impression Statement Patient presents with continued pain and restless feeling in both LE's that is intermittent and is now worse during the days and not only at night. She responded well to pain control modalities and should continue to progress with decreasing pain and progressive exercises with additional physical therapy intervention to address pain and limited funciton with daily chores/tasks.    Pt will benefit from skilled therapeutic intervention in order to improve on the following deficits Pain;Increased muscle spasms   Rehab Potential Fair   Clinical Impairments Affecting Rehab Potential chronic condition with LE symptoms   PT Frequency 2x / week   PT Duration 4 weeks   PT Treatment/Interventions Manual techniques;Electrical Stimulation;Passive range of motion;Moist Heat;Patient/family education;Cryotherapy;Therapeutic exercise   PT Next Visit Plan Pain control, progressive exercise as tolerated   Consulted and Agree with Plan of Care Patient        Problem List Patient Active Problem List   Diagnosis Date Noted  . RLS (restless legs syndrome) 08/23/2014  . Depression 09/29/2013  . Insomnia 09/29/2013  . Intractable migraine without aura 02/18/2013   Beacher MayMarie Brooks, PT  09/15/2014, 1:20 PM  [place] [facility] [address] [place], [place], [address] Phone: [phone]   Fax:  [phone]

## 2014-09-19 ENCOUNTER — Telehealth: Payer: Self-pay | Admitting: Neurology

## 2014-09-19 ENCOUNTER — Encounter: Payer: Medicare Other | Admitting: Physical Therapy

## 2014-09-19 MED ORDER — PREDNISONE 5 MG PO TABS: ORAL_TABLET | ORAL | Status: DC

## 2014-09-19 NOTE — Telephone Encounter (Signed)
I called the patient. She has had a headache for the last 3 days. I will call in a prednisone dosepack for her.

## 2014-09-19 NOTE — Telephone Encounter (Signed)
Patient called wanting to know if Dr. Anne HahnWillis can Rx her something for her migraine. She has had a migraine for 3 days now. She also mentioned that the HYDROCODONE is not helping. [place] [facility] [place] [phone] Please call and advise. Patient can be reached @ [phone]

## 2014-09-22 ENCOUNTER — Encounter: Payer: Self-pay | Admitting: Physical Therapy

## 2014-09-22 ENCOUNTER — Ambulatory Visit: Payer: Medicare Other | Attending: Physical Medicine and Rehabilitation | Admitting: Physical Therapy

## 2014-09-22 DIAGNOSIS — M25561 Pain in right knee: Secondary | ICD-10-CM

## 2014-09-22 DIAGNOSIS — M79662 Pain in left lower leg: Secondary | ICD-10-CM | POA: Diagnosis not present

## 2014-09-22 DIAGNOSIS — M25562 Pain in left knee: Secondary | ICD-10-CM

## 2014-09-22 DIAGNOSIS — M5416 Radiculopathy, lumbar region: Secondary | ICD-10-CM | POA: Diagnosis present

## 2014-09-22 DIAGNOSIS — M79661 Pain in right lower leg: Secondary | ICD-10-CM | POA: Diagnosis not present

## 2014-09-22 NOTE — Therapy (Signed)
[facility] [address] , [place], [address] Phone: [phone]   Fax:  [phone]  Physical Therapy Treatment  Patient Details  Name: Jessica HaggisGail Thacker Apgar MRN: [id number] Date of Birth: May 07, 1950 Referring Provider:  Lynnea FerrierKlein, Bert J III, MD  Encounter Date: 09/22/2014      PT End of Session - 09/22/14 1130    Visit Number 6   Number of Visits 16   Date for PT Re-Evaluation 09/28/14   Authorization Type 6   Authorization Time Period 10   PT Start Time 0955   PT Stop Time 1050   PT Time Calculation (min) 55 min   Activity Tolerance Patient tolerated treatment well   Behavior During Therapy [facility] for tasks assessed/performed      Past Medical History  Diagnosis Date  . Headache(784.0)   . Obesity   . OSA on CPAP   . Environmental allergies   . Renal calculi   . Plantar fasciitis     right  . Glaucoma   . Renal calculi   . Colon polyps   . Degenerative arthritis   . RLS (restless legs syndrome) 08/23/2014    Past Surgical History  Procedure Laterality Date  . Abdominal hysterectomy    . Tonsillectomy    . Appendectomy    . Breast biopsy    . Arthroscopic surgery, knee Left     There were no vitals filed for this visit.  Visit Diagnosis:  Pain in joint, lower leg, left  Pain in joint involving right lower leg      Subjective Assessment - 09/22/14 0957    Subjective Patient reports sheis still having pain in lower legs in calves primarily and the symptoms seem to be improving at times. Her concern is that the tingling, restless feelings are now during the day and not only at night. She denies back pain. When she is moving around the sensation is better and worse with static positions such as sitting or lying down.   Limitations Sitting;Standing   Patient Stated Goals patient would lik to decreased leg pain  and be able to perform normal activity without leg symptoms bothering her   Currently in Pain? Yes   Pain Score 1    Pain Location Leg   Pain Orientation Other (Comment)  both LE's. lower leg, left leg with crawling feeling today   Pain Descriptors / Indicators Aching;Tingling;Restless;Spasm   Pain Type Chronic pain   Pain Onset More than a month ago   Pain Frequency Intermittent   Pain Relieving Factors still unsure, still using TENS as able, heat   Effect of Pain on Daily Activities Affects her most with being still, if bad it affects all activities throughout the day     Objective:     Treatment: Therapeutic exercise: sitting on treatment table: hip adduction with ball between knees, hip abduction with resistive band, roll ball under foot with 3# weight on ankle, resistive band knee flexion all 15 reps with verbal cues and assistance to perform with correct position and technique, hook lying with ball between knee: lower trunk rotation x 2 min., bridging with ball between knees x 10 reps  Highvolt estim. Applied by therapist (4 electrodes) 2 to each LE calf region with patient in supine lying with both LE's supported by pillow and with moist heat applied to same during treatment with results of decreased sensations in both LE's and able to stand and walk with mild symptoms  as compared to pre treatment. Patient response to treatment : Patient requires verbal cuing and assistance to perform exercises with correct technique, she verbalized understanding of home program. She reported increased pain/cramping in back of thighs with resistive knee flexion, no worse following exercises overall      PT Education - 09/22/14 1025    Education provided Yes   Education Details Instructed in home exercises as outlined in exercises  with written instructions given   Person(s) Educated Patient   Methods Explanation;Demonstration;Verbal cues   Comprehension Verbalized understanding;Returned demonstration;Verbal cues required             PT Long Term Goals - 09/14/14  1620    PT LONG TERM GOAL #1   Title Patient will be independent with home program for self management exercises, posture awareness in 4 weeks. Ongoing (08/31/2014)   Time 4   Period Weeks   Status On-going   PT LONG TERM GOAL #2   Title Patient will demonstrate improved perceived disability on Modified Oswestry to 30% or less in 4 weeks. deferred, ongoing  (08/31/2014)   Time 4   Period Weeks   Status On-going               Plan - 09/22/14 1133    Clinical Impression Statement Patient is imporving with decreasing pain in both LE's with current treatment. She will benfit from additional physical therapy intervention to progress exercises and for pain control in order to be able to sit still with less pain in lower legs.    Pt will benefit from skilled therapeutic intervention in order to improve on the following deficits Pain;Increased muscle spasms   Clinical Impairments Affecting Rehab Potential chronic condition with LE symptoms   PT Frequency 2x / week   PT Duration 4 weeks   PT Treatment/Interventions Manual techniques;Electrical Stimulation;Passive range of motion;Moist Heat;Patient/family education;Cryotherapy;Therapeutic exercise   PT Next Visit Plan Pain control, progressive exercise as tolerated        Problem List Patient Active Problem List   Diagnosis Date Noted  . RLS (restless legs syndrome) 08/23/2014  . Depression 09/29/2013  . Insomnia 09/29/2013  . Intractable migraine without aura 02/18/2013   Beacher MayMarie Imri Lor, PT  09/22/2014, 9:35 PM  [facility] [address] [place], [place], [address] Phone: [phone]   Fax:  [phone]

## 2014-09-25 ENCOUNTER — Ambulatory Visit: Payer: Medicare Other | Admitting: Physical Therapy

## 2014-09-25 ENCOUNTER — Encounter: Payer: Medicare Other | Admitting: Physical Therapy

## 2014-09-27 ENCOUNTER — Encounter: Payer: Medicare Other | Admitting: Physical Therapy

## 2014-09-29 ENCOUNTER — Ambulatory Visit: Payer: Medicare Other | Admitting: Physical Therapy

## 2014-09-29 ENCOUNTER — Encounter: Payer: Self-pay | Admitting: Physical Therapy

## 2014-09-29 DIAGNOSIS — M25561 Pain in right knee: Secondary | ICD-10-CM

## 2014-09-29 DIAGNOSIS — M5416 Radiculopathy, lumbar region: Secondary | ICD-10-CM | POA: Diagnosis not present

## 2014-09-29 DIAGNOSIS — M25562 Pain in left knee: Secondary | ICD-10-CM

## 2014-09-30 NOTE — Therapy (Signed)
[place] [facility] [address] [place], [place], [address] Phone: [phone]   Fax:  [phone]  Physical Therapy Treatment  Patient Details  Name: Jessica HaggisGail Thacker Som MRN: [id number] Date of Birth: Sep 26, 1950 Referring Provider:  Merri Rayhasnis, Benjamin, MD  Encounter Date: 09/29/2014      PT End of Session - 09/29/14 1110    Visit Number 7   Number of Visits 16   Date for PT Re-Evaluation 09/28/14   Authorization Type 7   Authorization Time Period 10   PT Start Time 1017   PT Stop Time 1100   PT Time Calculation (min) 43 min   Activity Tolerance Patient tolerated treatment well   Behavior During Therapy [facility] for tasks assessed/performed      Past Medical History  Diagnosis Date  . Headache(784.0)   . Obesity   . OSA on CPAP   . Environmental allergies   . Renal calculi   . Plantar fasciitis     right  . Glaucoma   . Renal calculi   . Colon polyps   . Degenerative arthritis   . RLS (restless legs syndrome) 08/23/2014    Past Surgical History  Procedure Laterality Date  . Abdominal hysterectomy    . Tonsillectomy    . Appendectomy    . Breast biopsy    . Arthroscopic surgery, knee Left     There were no vitals filed for this visit.  Visit Diagnosis:  Pain in joint, lower leg, left - Plan: PT plan of care cert/re-cert  Pain in joint involving right lower leg - Plan: PT plan of care cert/re-cert      Subjective Assessment - 09/29/14 1025    Subjective Patient reports she is still having pain in lower legs in calves primarily and the symptoms seem to be improving at times and are mild at the moment. She did do a lot in the yard yesterday with picking up limbs and tossing then and is feeling the effects today. She continues with symptoms in her LE's with sitting and lying down and is better with moving.  She feels therapy is still helping at this time and would like to continue.    Limitations  Sitting;Standing   Patient Stated Goals patient would like to decreased leg pain  and be able to perform normal activity without leg symptoms bothering her   Currently in Pain? Yes   Pain Score 3    Pain Location Leg   Pain Orientation Other (Comment)  both lower legs   Pain Descriptors / Indicators Aching;Tingling   Pain Onset More than a month ago   Pain Frequency Intermittent                     Objective:     Treatment: Therapeutic exercise: sitting on treatment table: hip adduction with ball between knees with glute sets x 10, hip abduction with resistive band 2 x 15 reps, with verbal cues and assistance to perform with correct position and technique, rhythmic stabilization for flexion/extension of trunk x 10 reps,  hook lying with ball between knee: lower trunk rotation x 2 min., bridging with ball between knees x 10 reps, standing side stepping along foam balance beam x 5 sets, instructed in rotation as a primary exercise to decrease stress/tone in muscles and may assist with decreased pain in LE's and stiffness overall, patient LE symptoms were monitored throughout session  Highvolt estim. Applied by therapist (4 electrodes)  2 to each LE calf region with patient in supine lying with both LE's supported by pillow and with moist heat applied to same x 20 min. during treatment with results of decreased sensations in both LE's and able to stand and walk with mild symptoms as compared to pre treatment.  Patient response to treatment : Patient requires verbal cuing and assistance to perform exercises with correct technique, she verbalized understanding of home program and adding in more rotation movements. No adverse reaction to heat noted and reported no increased symptoms in LE's with treatment.         PT Education - 09/29/14 1100    Education provided Yes   Education Details Reassessed home exercise program with specific instructions for relaxation with rotation to be a  primary component of her program in order to decreased tension in muscles   Person(s) Educated Patient   Methods Explanation;Demonstration;Verbal cues   Comprehension Verbalized understanding;Returned demonstration;Verbal cues required             PT Long Term Goals - 09/29/14 1115    PT LONG TERM GOAL #1   Title Patient will be independent with home program for self management exercises, posture awareness by 10/27/2014   Status Revised   PT LONG TERM GOAL #2   Title Patient will demonstrate improved perceived disability on Modified Oswestry to 30% or less by 10/27/2014   Status Revised   PT LONG TERM GOAL #3   Title Patient will reports being able to sit for 30 min. without increased symptoms into LE's consistently on a daily basis by 10/27/2014   Status New               Plan - 09/29/14 1105    Clinical Impression Statement Patient is improving with decreasing pain in both LE's with current treatment. She continues with pain and symptoms into both LE's below her knees and has limited knowledge of appropriate pain cotnrol strategies to decrease her symptoms and imrpove function with sitting and lying down. She is also under a lot of stress with family situation with sick parent and has not been able to atten physical therapy consistently over the past few weeks. This has had a significant influence on her lack of progress towards her goals.   Pt will benefit from skilled therapeutic intervention in order to improve on the following deficits Pain;Increased muscle spasms   Rehab Potential Fair   Clinical Impairments Affecting Rehab Potential chronic condition with LE symptoms   PT Frequency 2x / week   PT Duration 4 weeks   PT Treatment/Interventions Manual techniques;Electrical Stimulation;Passive range of motion;Moist Heat;Patient/family education;Cryotherapy;Therapeutic exercise   PT Next Visit Plan Pain control, progressive exercise as tolerated        Problem  List Patient Active Problem List   Diagnosis Date Noted  . RLS (restless legs syndrome) 08/23/2014  . Depression 09/29/2013  . Insomnia 09/29/2013  . Intractable migraine without aura 02/18/2013    Beacher May PT 09/30/2014, 12:13 PM  [place] [facility] PHYSICAL AND SPORTS MEDICINE [address] [place], [address] Phone: [phone]   Fax:  [phone]

## 2014-10-03 ENCOUNTER — Ambulatory Visit: Payer: Medicare Other | Admitting: Physical Therapy

## 2014-10-03 ENCOUNTER — Encounter: Payer: Self-pay | Admitting: Physical Therapy

## 2014-10-03 DIAGNOSIS — M5416 Radiculopathy, lumbar region: Secondary | ICD-10-CM | POA: Diagnosis not present

## 2014-10-03 DIAGNOSIS — M25561 Pain in right knee: Secondary | ICD-10-CM

## 2014-10-03 DIAGNOSIS — M25562 Pain in left knee: Secondary | ICD-10-CM

## 2014-10-03 NOTE — Therapy (Signed)
[place] [facility] [address] [place], [address] Phone: [phone]   Fax:  [phone]  Physical Therapy Treatment  Patient Details  Name: Jessica Cole MRN: [id number] Date of Birth: 09-Nov-1950 Referring Provider:  Merri Ray, MD  Encounter Date: 10/03/2014      PT End of Session - 10/03/14 0909    Visit Number 8   Number of Visits 16   Date for PT Re-Evaluation 10/27/14   Authorization Type 8   Authorization Time Period 10   PT Start Time 0803   PT Stop Time 0847   PT Time Calculation (min) 44 min   Activity Tolerance Patient tolerated treatment well;No increased pain   Behavior During Therapy [facility] for tasks assessed/performed      Past Medical History  Diagnosis Date  . Headache(784.0)   . Obesity   . OSA on CPAP   . Environmental allergies   . Renal calculi   . Plantar fasciitis     right  . Glaucoma   . Renal calculi   . Colon polyps   . Degenerative arthritis   . RLS (restless legs syndrome) 08/23/2014    Past Surgical History  Procedure Laterality Date  . Abdominal hysterectomy    . Tonsillectomy    . Appendectomy    . Breast biopsy    . Arthroscopic surgery, knee Left     There were no vitals filed for this visit.  Visit Diagnosis:  Pain in joint, lower leg, left  Pain in joint involving right lower leg      Subjective Assessment - 10/03/14 0804    Subjective Patient reports she is sore today al over. She is still having symptoms into both LE's at night and during the day. She has not been able to perform exercises since previous visit.    Limitations Sitting;Standing   Patient Stated Goals patient would lik to decreased leg pain  and be able to perform normal activity without leg symptoms bothering her   Currently in Pain? Yes   Pain Score 3    Pain Location Leg   Pain Orientation Other (Comment)  Both LE's   Pain Descriptors / Indicators  Aching;Throbbing;Tingling   Pain Type Chronic pain   Pain Onset More than a month ago   Pain Frequency Intermittent   Effect of Pain on Daily Activities Affects her mots with beign still, if bad it afects all activities throughtou the day         [facility] Adult PT Treatment/Exercise - 10/03/14 0808    Exercises   Exercises Other Exercises   Other Exercises: for strength and endurance/pain control sitting on treatment table: rhythmic stabilization for trunk flexion/extension x 10 reps, scapular adduction x 10 reps with tactile cuing, trunk rotation x 5 reps right/left with tactile cues, guidance, sitting scapular rows and lat pull downs with resistive band 2 x 15 each with verbal cues, knee flexion with resistive band x 15 reps each with verbal cuing for controlling eccentric component, standing side stepping along foam balance beam x 5 reps with UE support for balance, hook lying on treatmnet table, ball between knees 2 x 10 reps bridging and lower trunk rotation with assistance, verbal cuing for correct technique  Patient was educated in home exercise: added calf raises 3-5 x /day x 5-10 reps, and continue with previous exercises/instructions    Modalities   Modalities Electrical Stimulation;Moist Heat   Moist Heat Therapy   Number Minutes  Moist Heat 20 Minutes   Moist Heat Location Other (comment)  both LE's, calves, in conjunction with high volt estim.   Programme researcher, broadcasting/film/videolectrical Stimulation   Electrical Stimulation Location lower legs, calves with patient supine lying   Electrical Stimulation Action high volt estim. applied (2) electrodes to each calf   Electrical Stimulation Parameters for muscle spasms relief, pain control, continuous mode, intensity to tolerance   Electrical Stimulation Goals Pain;Other (comment)  reduction of muscle spasms     Patient response to treatment: Patient required verbal and tactile cues to perform all exercises with correct technique and posture. She reported fatigue and  soreness with most exercises and no increased pain in LE's. No adverse reaction to heat noted. Decreased tone/spasms in calf muscles noted with estim/heat.  She verbalized understanding of home exercises and the need to perform with more consistency.           PT Long Term Goals - 09/29/14 1115    PT LONG TERM GOAL #1   Title Patient will be independent with home program for self management exercises, posture awareness by 10/27/2014   Status Revised   PT LONG TERM GOAL #2   Title Patient will demonstrate improved perceived disability on Modified Oswestry to 30% or less by 10/27/2014   Status Revised   PT LONG TERM GOAL #3   Title Patient will reports being able to sit for 30 min. without increased symptoms into LE's consistently on a daily basis by 10/27/2014   Status New               Plan - 10/03/14 0911    Clinical Impression Statement Patient was able to tolerate treatment without increased symptoms into LE's. She requires constanct verbal cuing to perform exercises with good posture/technique and fatigued with exercises. 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 She will benefit from additional physical therapy intervention to progress exercises in order to be able to transition to independent home program.    Pt will benefit from skilled therapeutic intervention in order to improve on the following deficits Pain;Increased muscle spasms   Rehab Potential Fair   PT Frequency 2x / week   PT Duration 4 weeks   PT Treatment/Interventions Manual techniques;Electrical Stimulation;Passive range of motion;Moist Heat;Patient/family education;Cryotherapy;Therapeutic exercise   PT Next Visit Plan Pain control, progressive exercise as tolerated        Problem List Patient Active Problem List   Diagnosis Date Noted  . RLS (restless legs syndrome) 08/23/2014  . Depression 09/29/2013  .  Insomnia 09/29/2013  . Intractable migraine without aura 02/18/2013    Beacher MayBrooks, Marie PT 10/03/2014, 9:24 AM  [place] [facility] [address] [place], [place], [id number] Phone: [phone]   Fax:  [phone]

## 2014-10-05 ENCOUNTER — Telehealth: Payer: Self-pay | Admitting: Neurology

## 2014-10-05 ENCOUNTER — Other Ambulatory Visit: Payer: Self-pay

## 2014-10-05 MED ORDER — PRAMIPEXOLE DIHYDROCHLORIDE 0.25 MG PO TABS: 0.2500 mg | ORAL_TABLET | Freq: Two times a day (BID) | ORAL | Status: DC

## 2014-10-05 MED ORDER — QUETIAPINE FUMARATE 100 MG PO TABS: 150.0000 mg | ORAL_TABLET | Freq: Every day | ORAL | Status: DC

## 2014-10-05 NOTE — Telephone Encounter (Signed)
I called the patient. The restless leg syndrome has increased to the fall where she is now having symptoms beginning at midday. I'll increase the Mirapex taking 1 at noon, and one in the evening. The patient wants me to call in a prescription for hydrocodone, I indicated that I do not feel comfortable doing this as she was getting narcotic prescriptions from multiple doctors previously.

## 2014-10-05 NOTE — Telephone Encounter (Signed)
Patient called inquiring if scripts would be ready to for pick up tomorrow. I asked her if she had talked with nurse and she stated yes that there was some confusion regarding seeing another Dr. but that was taken care of. I told her it did not show it was ready and she could call back in the am to check. Please call and advise. Patient can be reached at [phone].

## 2014-10-05 NOTE — Telephone Encounter (Signed)
Per OV on 12/11

## 2014-10-05 NOTE — Telephone Encounter (Signed)
I called the patient. Per Dr. Anne HahnWillis' note, he would not prescribe anymore hydrocodone for the patient. She was receiving several prescriptions for narcotics from different doctors. She states she is no longer taking the percocet or seeing the doctor who prescribed the percocet and would like to know if Dr. Anne HahnWillis will continue writing the hydrocodone for her?

## 2014-10-05 NOTE — Telephone Encounter (Signed)
Patient called requesting refill for hydrocodone. She is not out yet but is under stress due to Aunt being in hospital.  She also states during the day her legs are very restless and she states she needs something during the day. She has taken hydrocodone for restless legs also. Please call and advise. Patient can be reached at [phone].

## 2014-10-06 ENCOUNTER — Encounter: Payer: Medicare Other | Admitting: Physical Therapy

## 2014-10-10 ENCOUNTER — Ambulatory Visit: Payer: Medicare Other | Admitting: Physical Therapy

## 2014-10-11 ENCOUNTER — Ambulatory Visit: Payer: Medicare Other | Admitting: Physical Therapy

## 2014-10-12 ENCOUNTER — Other Ambulatory Visit: Payer: Self-pay | Admitting: Internal Medicine

## 2014-10-12 DIAGNOSIS — R7989 Other specified abnormal findings of blood chemistry: Secondary | ICD-10-CM

## 2014-10-12 DIAGNOSIS — R945 Abnormal results of liver function studies: Principal | ICD-10-CM

## 2014-10-12 NOTE — Telephone Encounter (Signed)
Error

## 2014-10-16 ENCOUNTER — Telehealth: Payer: Self-pay | Admitting: Neurology

## 2014-10-16 ENCOUNTER — Ambulatory Visit
Admission: RE | Admit: 2014-10-16 | Discharge: 2014-10-16 | Disposition: A | Discharge status: Home / Self Care | Payer: Medicare Other | Source: Ambulatory Visit | Attending: Internal Medicine | Admitting: Internal Medicine

## 2014-10-16 DIAGNOSIS — R7989 Other specified abnormal findings of blood chemistry: Secondary | ICD-10-CM | POA: Insufficient documentation

## 2014-10-16 DIAGNOSIS — R945 Abnormal results of liver function studies: Secondary | ICD-10-CM

## 2014-10-16 MED ORDER — HYDROCODONE-ACETAMINOPHEN 5-325 MG PO TABS: 1.0000 | ORAL_TABLET | Freq: Four times a day (QID) | ORAL | Status: DC | PRN

## 2014-10-16 MED ORDER — PRAMIPEXOLE DIHYDROCHLORIDE 0.25 MG PO TABS: 0.7500 mg | ORAL_TABLET | Freq: Every day | ORAL | Status: DC

## 2014-10-16 NOTE — Telephone Encounter (Signed)
I called the patient. Restless leg syndrome is still a problem, she may go up to 0.75 mg of Mirapex at night. I will call in a prescription for this. The patient was also given a prescription for hydrocodone, she has signed a narcotic agreement. She will get 30 tablets a month. She indicates that she is not getting any other opiate medications from other doctors.

## 2014-10-16 NOTE — Telephone Encounter (Signed)
Patient called and stated that she is experiencing a lot of problems with RLS and migraines. She would like to know if Dr. Anne Hahn will write her a prescription to help with these issues. Please call and advise.

## 2014-10-17 ENCOUNTER — Ambulatory Visit: Payer: Medicare Other | Attending: Physical Medicine and Rehabilitation | Admitting: Physical Therapy

## 2014-10-17 ENCOUNTER — Telehealth: Payer: Self-pay

## 2014-10-17 ENCOUNTER — Encounter: Payer: Self-pay | Admitting: Physical Therapy

## 2014-10-17 DIAGNOSIS — R531 Weakness: Secondary | ICD-10-CM | POA: Insufficient documentation

## 2014-10-17 DIAGNOSIS — M25569 Pain in unspecified knee: Secondary | ICD-10-CM | POA: Diagnosis present

## 2014-10-17 DIAGNOSIS — M25562 Pain in left knee: Secondary | ICD-10-CM | POA: Diagnosis not present

## 2014-10-17 DIAGNOSIS — M25561 Pain in right knee: Secondary | ICD-10-CM | POA: Insufficient documentation

## 2014-10-17 NOTE — Therapy (Signed)
[place] [facility] [address] [place], [address] Phone: [phone]   Fax:  [phone]  Physical Therapy Treatment  Patient Details  Name: Jessica Cole MRN: [id number] Date of Birth: [birth year] Referring Provider:  Merri Ray, MD  Encounter Date: 10/17/2014      PT End of Session - 10/17/14 1658    Visit Number 9   Number of Visits 16   Date for PT Re-Evaluation 10/27/14   Authorization Type 9   Authorization Time Period 10   PT Start Time 1018   PT Stop Time 1052   PT Time Calculation (min) 34 min   Activity Tolerance Patient tolerated treatment well;No increased pain   Behavior During Therapy [facility] for tasks assessed/performed      Past Medical History  Diagnosis Date  . Headache(784.0)   . Obesity   . OSA on CPAP   . Environmental allergies   . Renal calculi   . Plantar fasciitis     right  . Glaucoma   . Renal calculi   . Colon polyps   . Degenerative arthritis   . RLS (restless legs syndrome) 08/23/2014    Past Surgical History  Procedure Laterality Date  . Abdominal hysterectomy    . Tonsillectomy    . Appendectomy    . Breast biopsy    . Arthroscopic surgery, knee Left     There were no vitals filed for this visit.  Visit Diagnosis:  Pain in joint, lower leg, left  Pain in joint involving right lower leg      Subjective Assessment - 10/17/14 1021    Subjective Patient reports she woke up with a headache today (allergies). Her legs are still bothering her however not as bad with added medication at night. She has not been able to perform exercises for home due to migraines last week.    Limitations Sitting;Standing   Patient Stated Goals patient would lik to decreased leg pain  and be able to perform normal activity without leg symptoms bothering her   Currently in Pain? Yes   Pain Score 4    Pain Location Leg   Pain Orientation --  both LE's, knee down   Pain  Descriptors / Indicators Aching;Throbbing   Pain Type Chronic pain   Pain Onset More than a month ago   Pain Frequency Intermittent   Multiple Pain Sites No           [facility] Adult PT Treatment/Exercise - 10/17/14 1025    Exercises   Exercises Other Exercises   Other Exercises  sitting on treatment table: hip adduction with ball between knees with glute sets x 10, hip abduction with resistive band 2 x 15 reps, with verbal cues and assistance to perform with correct position and technique, ankle DF/PF/inversion and eversion x 20 reps each with demonstration/verbal cuing,  rhythmic stabilization for trunk flexion/extension x 10 reps, scapula adduction with verbal and tactile cues x 10, scapular rows and lat pull downs with resistive band 2 x 15 reps with verbal cuing, knee flexion with resistive band 2 x 15 reps each with verbal cuing for controlling eccentric component, knee extension with 2# weights on ankles (alternated with knee flexion), standing side stepping along foam balance beam x 5 reps with UE support for balance, wide stance walking forward and backwards along either side of balance beam x 3 reps   Modalities   Modalities --   Moist Heat Therapy  Moist Heat Location --   Copy Goals --      Patient response to treatment: Patient required verbal cuing to perform most exercises and demonstrated fatigue and cramping sensation in back of LE's hamstring and calf muscles with walking activities and had to rest 30 seconds to recover before resuming exercises       PT Education - 10/17/14 1100    Education provided Yes   Education Details Instructed in home exercises for LE's ankles added: DF/PF/Eversion/Inversion in sitting   Person(s) Educated Patient   Methods Explanation;Demonstration;Verbal cues   Comprehension Verbalized understanding;Returned demonstration;Verbal cues required              PT Long Term Goals - 09/29/14 1115    PT LONG TERM GOAL #1   Title Patient will be independent with home program for self management exercises, posture awareness by 10/27/2014   Status Revised   PT LONG TERM GOAL #2   Title Patient will demonstrate improved perceived disability on Modified Oswestry to 30% or less by 10/27/2014   Status Revised   PT LONG TERM GOAL #3   Title Patient will reports being able to sit for 30 min. without increased symptoms into LE's consistently on a daily basis by 10/27/2014   Status New               Plan - 10/17/14 1100    Clinical Impression Statement Patient demonstrated improved technique with all exercises with verbal cues and demonstration. She continues with pain in LE's below the knees and is self managing pain at home with medication. She is aware of the need to exercise more to improve endurance and strength to see if this helps with the hypersensitivity in her LE's.    Rehab Potential Fair   Clinical Impairments Affecting Rehab Potential chronic condition with LE symptoms   PT Frequency 2x / week   PT Duration 4 weeks   PT Treatment/Interventions Manual techniques;Electrical Stimulation;Passive range of motion;Moist Heat;Patient/family education;Cryotherapy;Therapeutic exercise   PT Next Visit Plan Pain control, progressive exercise as tolerated        Problem List Patient Active Problem List   Diagnosis Date Noted  . RLS (restless legs syndrome) 08/23/2014  . Depression 09/29/2013  . Insomnia 09/29/2013  . Intractable migraine without aura 02/18/2013    Beacher May PT 10/17/2014, 5:02 PM  [place] [facility] [address], [place], [address] Phone: [phone]   Fax:  [phone]

## 2014-10-17 NOTE — Telephone Encounter (Signed)
Rx ready for pick up. 

## 2014-10-19 ENCOUNTER — Ambulatory Visit: Payer: Medicare Other | Admitting: Physical Therapy

## 2014-10-19 ENCOUNTER — Encounter: Payer: Self-pay | Admitting: Physical Therapy

## 2014-10-19 DIAGNOSIS — M25562 Pain in left knee: Secondary | ICD-10-CM

## 2014-10-19 DIAGNOSIS — M25561 Pain in right knee: Secondary | ICD-10-CM

## 2014-10-19 NOTE — Therapy (Signed)
[place] [facility]TS [facility] [address] [place], [address] Phone: [phone]   Fax:  [phone]  Physical Therapy Treatment  Patient Details  Name: Leatta Alewine MRN: [id number] Date of Birth: 1950/08/12 Referring Provider:  Merri Ray, MD  Encounter Date: 10/19/2014      PT End of Session - 10/19/14 0853    Visit Number 10   Number of Visits 16   Date for PT Re-Evaluation 10/27/14   Authorization Type 10   Authorization Time Period 10   PT Start Time 0800   PT Stop Time 0843   PT Time Calculation (min) 43 min   Activity Tolerance Patient tolerated treatment well;No increased pain   Behavior During Therapy [facility] for tasks assessed/performed      Past Medical History  Diagnosis Date  . Headache(784.0)   . Obesity   . OSA on CPAP   . Environmental allergies   . Renal calculi   . Plantar fasciitis     right  . Glaucoma   . Renal calculi   . Colon polyps   . Degenerative arthritis   . RLS (restless legs syndrome) 08/23/2014    Past Surgical History  Procedure Laterality Date  . Abdominal hysterectomy    . Tonsillectomy    . Appendectomy    . Breast biopsy    . Arthroscopic surgery, knee Left     There were no vitals filed for this visit.  Visit Diagnosis:  Pain in joint, lower leg, left  Pain in joint involving right lower leg      Subjective Assessment - 10/19/14 0804    Subjective Patient reports being a little sore after last session, no reproduction of symptoms in legs. Today she reports she is still having symptoms in lower legs but is not sure how much is from exercise vs normal pain. She feels that the change in medication to 3x/day that she began after seeing her MD is helping with leg symptoms.    Limitations Sitting;Standing   Patient Stated Goals patient would lik to decreased leg pain  and be able to perform normal activity without leg symptoms bothering her   Currently in  Pain? Yes   Pain Score 3    Pain Location Leg   Pain Orientation --  both lower legs   Pain Descriptors / Indicators Aching   Pain Type Chronic pain   Pain Onset More than a month ago     Objective: Modified Oswestry: 40%       [facility] Adult PT Treatment/Exercise - 10/19/14 0805    Exercises   Exercises Other Exercises   Other Exercises  sitting on treatment table: hip adduction with ball between knees with glute sets x 10, hip abduction with double resistive band 1 x 20 reps, with verbal cues and assistance to perform with correct position and technique, rhythmic stabilization for trunk flexion/extension x 15 reps, scapular rows and lat pull downs with resistive band 2 x 15 reps with verbal cuing,  knee flexion with resistive band 2 x 15 reps each with verbal cuing for controlling eccentric component, knee extension with 2# weights on ankles (alternated with knee flexion), standing side stepping along foam balance beam x 5 reps with UE support for balance,ankle DF/PF and inversion /eversion in sitting x 20 reps each (2# weights held on knees for PF exercise (NuStep x 10 min. at end of session level #4 workload: unbilled time)      Patient  response to treatment: Patient reported increased cramping/spasms in back of thighs/calfves with hamstring exercises and walking side stepping on foam beam, required minimal rest periods during exercise transitions and was able to complete all exercises with minimal cuing for correct technique and to control motions during exercises          PT Education - 10/29/14 0830    Education provided Yes   Education Details Home exercise instruction with written exercises given, verbal cuing required for all exercises and assistance with resistive band exercises   Person(s) Educated Patient   Methods Explanation;Verbal cues   Comprehension Verbalized understanding;Returned demonstration;Verbal cues required             PT Long Term Goals - 2014/10/29  0856    PT LONG TERM GOAL #1   Title Patient will be independent with home program for self management exercises, posture awareness by 10/27/2014   Status On-going   PT LONG TERM GOAL #2   Title Patient will demonstrate improved perceived disability on Modified Oswestry to 30% or less by 10/27/2014   Status On-going   PT LONG TERM GOAL #3   Title Patient will reports being able to sit for 30 min. without increased symptoms into LE's consistently on a daily basis by 10/27/2014   Status On-going               Plan - 10/29/14 0853    Clinical Impression Statement Patient is progressing well with exercises and improved understanding of the need to incorporate exercise into daily routine. She conitnues with intermittent symptoms of pain in both lower legs and this seems to be improving with change in medication per patient report. She requires guidance and verbal cuing to perform exercises correctly at this time and will benefit from additional sessions to be able to transition to independent home exercise program.    Pt will benefit from skilled therapeutic intervention in order to improve on the following deficits Pain;Increased muscle spasms   Rehab Potential Fair   PT Frequency 2x / week   PT Duration 4 weeks   PT Treatment/Interventions Manual techniques;Electrical Stimulation;Passive range of motion;Moist Heat;Patient/family education;Cryotherapy;Therapeutic exercise   PT Next Visit Plan Pain control, progressive exercise as tolerated          G-Codes - 10-29-2014 0857    Functional Assessment Tool Used pain scale, clinical judgment, modified oswestry   Functional Limitation Mobility: Walking and moving around   Mobility: Walking and Moving Around Current Status ([id number]) At least 40 percent but less than 60 percent impaired, limited or restricted   Mobility: Walking and Moving Around Goal Status [phone]) At least 20 percent but less than 40 percent impaired, limited or restricted       Problem List Patient Active Problem List   Diagnosis Date Noted  . RLS (restless legs syndrome) 08/23/2014  . Depression 09/29/2013  . Insomnia 09/29/2013  . Intractable migraine without aura 02/18/2013    Beacher May PT 29-Oct-2014, 9:05 AM  [place] [facility] SPORTS MEDICINE [address] [place], [address] Phone: [phone]   Fax:  [phone]

## 2014-10-24 ENCOUNTER — Encounter: Payer: Self-pay | Admitting: Physical Therapy

## 2014-10-24 ENCOUNTER — Ambulatory Visit: Payer: Medicare Other | Admitting: Physical Therapy

## 2014-10-24 DIAGNOSIS — M25561 Pain in right knee: Secondary | ICD-10-CM

## 2014-10-24 DIAGNOSIS — M25562 Pain in left knee: Secondary | ICD-10-CM

## 2014-10-24 NOTE — Therapy (Signed)
[place] [facility] PHYS[facility]TS MEDICINE [address] [place], [address] Phone: [phone]   Fax:  [phone]  Physical Therapy Treatment  Patient Details  Name: Jessica Cole MRN: [id number] Date of Birth: 05-20-1950 Referring Provider:  Merri Ray, MD  Encounter Date: 10/24/2014      PT End of Session - 10/24/14 0844    Visit Number 11   Number of Visits 16   Date for PT Re-Evaluation 10/27/14   Authorization Type 11   Authorization Time Period 20   PT Start Time 0800   PT Stop Time 0843   PT Time Calculation (min) 43 min   Activity Tolerance Patient tolerated treatment well   Behavior During Therapy [facility] for tasks assessed/performed      Past Medical History  Diagnosis Date  . Headache(784.0)   . Obesity   . OSA on CPAP   . Environmental allergies   . Renal calculi   . Plantar fasciitis     right  . Glaucoma   . Renal calculi   . Colon polyps   . Degenerative arthritis   . RLS (restless legs syndrome) 08/23/2014    Past Surgical History  Procedure Laterality Date  . Abdominal hysterectomy    . Tonsillectomy    . Appendectomy    . Breast biopsy    . Arthroscopic surgery, knee Left     There were no vitals filed for this visit.  Visit Diagnosis:  Pain in joint, lower leg, left  Pain in joint involving right lower leg      Subjective Assessment - 10/24/14 0808    Subjective Patient reports being sore following previous session and has intermittent exacerbations of LE symptoms with differing intensities.    Limitations Other (comment)  at night mostly now   Patient Stated Goals patient would lik to decreased leg pain  and be able to perform normal activity without leg symptoms bothering her, especially at night   Currently in Pain? Yes   Pain Score 1    Pain Location Leg   Pain Orientation Other (Comment)  both lower legs   Pain Descriptors / Indicators Aching   Pain Type Chronic pain   Pain Onset More than a month ago   Pain Frequency Intermittent   Multiple Pain Sites No           [facility] Adult PT Treatment/Exercise - 10/24/14 0811    Exercises   Exercises Other Exercises   Other Exercises  sitting on treatment table: hip adduction with ball between knees with glute sets x 10, hip abduction with resistive band 1 x 10 reps, with verbal cues and assistance to perform with correct position and technique, rhythmic stabilization for trunk flexion/extension x 15 reps, lumbar extension with resistive band in sitting x 10 reps, knee flexion with resistive band 1 x 15 reps each with verbal cuing for controlling eccentric component, knee extension with 3# weights on ankles (alternated with knee flexion), sit to stand x 10 reps with ball between knees, standing side stepping along foam balance beam x 5 reps with UE support for balance, supine hook lying hip abduction with resistive band x 15 reps, lower trunk rotation with ball under LE's, hip and knee flexion with ball x 15 reps, side lying clam with red resistive band x 10 reps each side.  Extended instruction in home exercise program and selected exercises to perform at home      Patient response to treatment: Patient  reported increased soreness in side of hips and muscles during exercises, no worse at end of session and not as sore as previous session. She required verbal cuing and constant guidance to perform exercises correctly          PT Education - 10/24/14 0844    Education provided yes   Education Details reinforced exercises to be done at home: using ball for hip exercises in sitting and supine lying   Person(s) Educated Patient   Methods Explanation;Demonstration;Verbal cues   Comprehension Verbalized understanding;Returned demonstration;Verbal cues required             PT Long Term Goals - 10/19/14 0856    PT LONG TERM GOAL #1   Title Patient will be independent with home program for self management  exercises, posture awareness by 10/27/2014   Status On-going   PT LONG TERM GOAL #2   Title Patient will demonstrate improved perceived disability on Modified Oswestry to 30% or less by 10/27/2014   Status On-going   PT LONG TERM GOAL #3   Title Patient will reports being able to sit for 30 min. without increased symptoms into LE's consistently on a daily basis by 10/27/2014   Status On-going               Plan - 10/24/14 0845    Clinical Impression Statement progressing with exercises slowly due to continued pain /symptoms in lower legs and patient not being compliant with home exercises. She verbalized good understanding of home program and the necessity of exercising at home in order to see results and monitor progress.    Rehab Potential Fair   PT Frequency 2x / week   PT Duration 4 weeks   PT Treatment/Interventions Manual techniques;Electrical Stimulation;Passive range of motion;Moist Heat;Patient/family education;Cryotherapy;Therapeutic exercise   PT Next Visit Plan Pain control, progressive exercise as tolerated        Problem List Patient Active Problem List   Diagnosis Date Noted  . RLS (restless legs syndrome) 08/23/2014  . Depression 09/29/2013  . Insomnia 09/29/2013  . Intractable migraine without aura 02/18/2013    Beacher May PT 10/24/2014, 8:47 AM  [place] [facility] PHYSICAL AND SPORTS MEDICINE [address] [place], [address] Phone: [phone]   Fax:  [phone]

## 2014-10-26 ENCOUNTER — Encounter: Payer: Self-pay | Admitting: Physical Therapy

## 2014-10-26 ENCOUNTER — Ambulatory Visit: Payer: Medicare Other | Admitting: Physical Therapy

## 2014-10-26 DIAGNOSIS — M25569 Pain in unspecified knee: Secondary | ICD-10-CM

## 2014-10-26 DIAGNOSIS — R531 Weakness: Secondary | ICD-10-CM

## 2014-10-26 DIAGNOSIS — M25562 Pain in left knee: Secondary | ICD-10-CM | POA: Diagnosis not present

## 2014-10-26 NOTE — Therapy (Signed)
[place] [facility] PHYS[facility]TS MEDICINE [address] [place], [address] Phone: [phone]   Fax:  [phone]  Physical Therapy Treatment/Discharge Summary  Patient Details  Name: Jessica Cole MRN: [id number] Date of Birth: [birth year] Referring Provider:  Merri Ray, MD  Encounter Date: 10/26/2014   Patient has attended physical therapy from 08/2014 until 10/27/2014 and attended 12 of 16 scheduled visits. She has achieved goals set. She is recommended for discharge from physical therapy at this time. See treatment note for last visit assessment and treatment details:      PT End of Session - 10/26/14 0845    Visit Number 12   Number of Visits 16   Date for PT Re-Evaluation 10/27/14   Authorization Type 12   Authorization Time Period 20   PT Start Time 0803   PT Stop Time 0840   PT Time Calculation (min) 37 min   Activity Tolerance Patient tolerated treatment well   Behavior During Therapy [facility] for tasks assessed/performed      Past Medical History  Diagnosis Date  . Headache(784.0)   . Obesity   . OSA on CPAP   . Environmental allergies   . Renal calculi   . Plantar fasciitis     right  . Glaucoma   . Renal calculi   . Colon polyps   . Degenerative arthritis   . RLS (restless legs syndrome) 08/23/2014    Past Surgical History  Procedure Laterality Date  . Abdominal hysterectomy    . Tonsillectomy    . Appendectomy    . Breast biopsy    . Arthroscopic surgery, knee Left     There were no vitals filed for this visit.  Visit Diagnosis:  Pain in joint, lower leg, unspecified laterality  Weakness generalized      Subjective Assessment - 10/26/14 0810    Subjective Patient reports she is better and able to do all activities with intermittent symptoms into LE's. Her back is fine and she is feeling stiff at times but is able to perform all activites at home. She agrees to discharge from physical therapy  at this time.    Currently in Pain? No/denies   Multiple Pain Sites No     Objective:   Outcome measures: LEFS: 76/80, modified oswestry 4% (no significant self perceived disability)       [facility] Adult PT Treatment/Exercise - 10/26/14 0859    Exercises   Exercises Other Exercises   Other Exercises   Reassessed home exercises with written review and performance with minimal cuing required: sitting on treatment table: hip adduction with ball between knees with glute sets x 10, hip abduction with resistive band 1 x 10 reps, with verbal cues and assistance to perform with correct position and technique, scapular rows and lat pull downs with resistive band 2 x 15 reps with verbal cuing,  knee flexion with resistive band 1 x 15 reps each, knee extension with 3# weights on ankles (alternated with knee lfexin), standing side stepping along counter, hip extension and abduction 2 x 5 reps each with stabilizing on opposite LE. ankle DF/PF and inversion /eversion in sitting x 20 reps each      Patient response to treatment: improved control, technique with minimal cuing and verbalized understanding of home exercises           PT Education - 10/26/14 0825    Education provided Yes   Education Details Reassessed home exercises for LE's and core  stability/control with verbal cues, demonstration   Person(s) Educated Patient   Methods Explanation;Demonstration;Verbal cues   Comprehension Verbalized understanding;Returned demonstration;Verbal cues required             PT Long Term Goals - 11/06/14 0906    PT LONG TERM GOAL #1   Title Patient will be independent with home program for self management exercises, posture awareness by 10/27/2014   Status Achieved   PT LONG TERM GOAL #2   Title Patient will demonstrate improved perceived disability on Modified Oswestry to 30% or less by 10/27/2014   Status Achieved   PT LONG TERM GOAL #3   Title Patient will reports being able to sit for 30 min.  without increased symptoms into LE's consistently on a daily basis by 10/27/2014   Status Achieved               Plan - 06-Nov-2014 0845    Clinical Impression Statement Patient has achieved independence with home exercise program and self management of symptoms. She has improved significantly and has less than 25% impairment based on LEFS, clinical judgement and modified oswestry scores. She continues with intermittent symptoms into LE's and is able to self manage these at home. She is ready for discharge from physical therapy at this time.    Rehab Potential Fair   Clinical Impairments Affecting Rehab Potential chronic condition with LE symptoms   PT Frequency 2x / week   PT Duration 4 weeks   PT Treatment/Interventions Manual techniques;Electrical Stimulation;Passive range of motion;Moist Heat;Patient/family education;Cryotherapy;Therapeutic exercise          G-Codes - 06-Nov-2014 0907    Functional Assessment Tool Used pain scale, clinical judgment, modified oswestry, LEFS   Functional Limitation Mobility: Walking and moving around   Mobility: Walking and Moving Around Current Status [phone]) At least 20 percent but less than 40 percent impaired, limited or restricted   Mobility: Walking and Moving Around Goal Status [phone]) At least 20 percent but less than 40 percent impaired, limited or restricted   Mobility: Walking and Moving Around Discharge Status [phone]) At least 20 percent but less than 40 percent impaired, limited or restricted      Problem List Patient Active Problem List   Diagnosis Date Noted  . RLS (restless legs syndrome) 08/23/2014  . Depression 09/29/2013  . Insomnia 09/29/2013  . Intractable migraine without aura 02/18/2013    Beacher May PT 2014-11-06, 9:11 AM  [place] [facility] PHYSICAL AND SPORTS MEDICINE [address], [place], [address] Phone: [phone]   Fax:  [phone]

## 2014-11-10 DIAGNOSIS — E119 Type 2 diabetes mellitus without complications: Secondary | ICD-10-CM | POA: Insufficient documentation

## 2014-11-20 ENCOUNTER — Other Ambulatory Visit: Payer: Self-pay | Admitting: Neurology

## 2014-11-20 ENCOUNTER — Telehealth: Payer: Self-pay

## 2014-11-20 MED ORDER — HYDROCODONE-ACETAMINOPHEN 5-325 MG PO TABS: 1.0000 | ORAL_TABLET | Freq: Four times a day (QID) | ORAL | Status: DC | PRN

## 2014-11-20 NOTE — Telephone Encounter (Signed)
Rx ready for pick up. 

## 2014-11-20 NOTE — Telephone Encounter (Signed)
Request entered, forwarded to provider for approval.  

## 2014-11-20 NOTE — Telephone Encounter (Signed)
Patient called and requested a written Rx for HYDROcodone-acetaminophen (NORCO/VICODIN) 5-325 MG per tablet.

## 2014-11-22 ENCOUNTER — Telehealth: Payer: Self-pay | Admitting: *Deleted

## 2014-11-22 NOTE — Telephone Encounter (Signed)
I called the patient. She wanted to let us know that her psychiatrist, Dr. Judith BlonderKupur, d/c her Seroquel and Mirapex. She started her on Temazepam 30 mg at bedtime and Requip 1 mg three times daily. I have updated the med list to reflect this.

## 2014-12-22 ENCOUNTER — Ambulatory Visit: Payer: Medicare Other | Attending: Physical Medicine and Rehabilitation | Admitting: Occupational Therapy

## 2014-12-22 DIAGNOSIS — M79641 Pain in right hand: Secondary | ICD-10-CM | POA: Insufficient documentation

## 2014-12-22 NOTE — Patient Instructions (Signed)
Contrast on palmar side of hand  Tendon gliding  Opposition AROM   MC block splint for 3rd ad 4th during gripping activities and sleeping And provided info on AE and joint protection principles

## 2014-12-22 NOTE — Therapy (Signed)
[place] [facility] PHYS[facility]ORTS MEDICINE [address] [place], [address] Phone: [phone]   Fax:  [phone]  Occupational Therapy Treatment  Patient Details  Name: Jessica Cole MRN: [id number] Date of Birth: [birth year] Referring Provider:  Kandyce Rud.,*  Encounter Date: 12/22/2014      OT End of Session - 12/22/14 1419    Visit Number 1   Number of Visits 3   Date for OT Re-Evaluation 01/12/15   OT Start Time 1101   OT Stop Time 1200   OT Time Calculation (min) 59 min   Activity Tolerance Patient tolerated treatment well   Behavior During Therapy [facility] for tasks assessed/performed      Past Medical History  Diagnosis Date  . Headache(784.0)   . Obesity   . OSA on CPAP   . Environmental allergies   . Renal calculi   . Plantar fasciitis     right  . Glaucoma   . Renal calculi   . Colon polyps   . Degenerative arthritis   . RLS (restless legs syndrome) 08/23/2014    Past Surgical History  Procedure Laterality Date  . Abdominal hysterectomy    . Tonsillectomy    . Appendectomy    . Breast biopsy    . Arthroscopic surgery, knee Left     There were no vitals filed for this visit.  Visit Diagnosis:  Pain of right hand - Plan: Ot plan of care cert/re-cert      Subjective Assessment - 12/22/14 1410    Subjective  Pain and triggering started about May but was getting bad 2 months before that   Patient Stated Goals My sister has RA and she started out like this - I don't want to follow the same path- want the pain and triggering to stop   Currently in Pain? Yes   Pain Score 2    Pain Location Hand   Pain Orientation Right   Pain Descriptors / Indicators Aching   Pain Type Chronic pain   Pain Onset More than a month ago   Pain Frequency Intermittent   Aggravating Factors  Pain increase at times to 9-10/10 ; and here and there 0/10            [facility] OT Assessment - 12/22/14 0001     Assessment   Diagnosis Trigger finger R 3rd and 4th , hand pain    Onset Date 07/04/14   Assessment Pt report having pain and triggering since March and was  getting worse - Dr Graciela Husbands put her on predisone but did not get better - seen Dr Gavin Potters 15th and got shot for 3rd digit - and splint - refer to hand therapy/OT    Home  Environment   Lives With Spouse   Prior Function   Level of Independence Independent   Leisure Likes to do baking cakes, sewing ( hand and machine ,crafts, reading and some gardening   Edema   Edema minimal over 2nd and 3rd MC's R hand   Right Hand AROM   R Index  MCP 0-90 80 Degrees   R Long  MCP 0-90 80 Degrees                  OT Treatments/Exercises (OP) - 12/22/14 0001    Splinting   Splinting MC block splint for 3rd and 4th digit to use at night time and during day time with composite gripping  OT Education - 30-Dec-2014 1419    Education provided Yes   Education Details HEP see pt instruction   Person(s) Educated Patient   Methods Explanation;Demonstration;Tactile cues;Handout   Comprehension Verbalized understanding;Returned demonstration;Verbal cues required          OT Short Term Goals - 12-30-14 1425    OT SHORT TERM GOAL #1   Title Pain on PRHWE improve by at least 10 points   Baseline PRWHE pain 32/50   Time 2   Period Weeks   Status New   OT SHORT TERM GOAL #2   Title Pt to be ind in use of splints and HEP to decrease pain    Baseline no knowledge of splint use    Time 1   Period Weeks   Status New           OT Long Term Goals - 2014-12-30 1427    OT LONG TERM GOAL #1   Title Pt to verbalize 3 joint protection and AE use to decrease pain and increase use of R dominant hand    Baseline no knowledge of joint protection or AE    Time 3   Period Weeks   Status New               Plan - 2014/12/30 1420    Clinical Impression Statement Pt present with history of trigger fingers of R 3rd and  4th - but report no triggering last few months , only pain - but after shot on 15th - no tenderness this date - did show some signs of CT on the R with positive Phalens - AROM appear WNL except 2nd and 3rd MC - decrease grip and prehension compare to L but WFL - Fabircated 2 MC block splints for 3rd and 4th to be wore during gripping and night time to decrease pain  on A1pullyes -  and education initiated on AE and joint protectoin prinicples    Pt will benefit from skilled therapeutic intervention in order to improve on the following deficits (Retired) Pain;Decreased strength;Decreased knowledge of use of DME;Impaired UE functional use   Rehab Potential Fair   OT Frequency 1x / week   OT Duration 4 weeks   OT Treatment/Interventions Self-care/ADL training;Contrast Bath;Ultrasound;Therapeutic exercise;Manual Therapy;Splinting;Patient/family education   Plan Assess splint use, HEP for ROM and joint protection /AE questions    OT Home Exercise Plan see pt instruction   Consulted and Agree with Plan of Care Patient          G-Codes - 30-Dec-2014 1428    Functional Assessment Tool Used PRWHE pain and function, ROM , grip and prehension and clinical judgement    Functional Limitation Self care   Self Care Current Status (U0454) At least 40 percent but less than 60 percent impaired, limited or restricted   Self Care Goal Status (U9811) At least 1 percent but less than 20 percent impaired, limited or restricted      Problem List Patient Active Problem List   Diagnosis Date Noted  . RLS (restless legs syndrome) 08/23/2014  . Depression 09/29/2013  . Insomnia 09/29/2013  . Intractable migraine without aura 02/18/2013    Oletta Cohn OTR/L,CLT 30-Dec-2014, 2:34 PM  [place] [facility] PHYSICAL [facility] SPORTS MEDICINE [address] [place], [address] Phone: [phone]   Fax:  [phone]

## 2014-12-25 ENCOUNTER — Ambulatory Visit: Payer: Medicare Other | Admitting: Occupational Therapy

## 2014-12-25 ENCOUNTER — Telehealth: Payer: Self-pay | Admitting: Neurology

## 2014-12-25 MED ORDER — TRAMADOL HCL 50 MG PO TABS: 50.0000 mg | ORAL_TABLET | Freq: Four times a day (QID) | ORAL | Status: DC | PRN

## 2014-12-25 NOTE — Telephone Encounter (Signed)
Pt called and would like to know if she can have something for migraines. States that they have increased and is under more stress right now. If so would the Rx be able to be sent to a different pharmacy. [facility] [place], [place] phone [phone]. Pt is out of town right now.   Please call and advise [phone] Thank you

## 2014-12-25 NOTE — Telephone Encounter (Signed)
I called patient. The patient is under stress taking care of her mother, plans to come back this next Sunday. I will call in a small prescription for Ultram, one time prescription. I called it into the [place] [facility].

## 2014-12-28 ENCOUNTER — Encounter: Payer: Medicare Other | Admitting: Occupational Therapy

## 2014-12-29 ENCOUNTER — Encounter: Payer: Medicare Other | Admitting: Occupational Therapy

## 2015-01-04 ENCOUNTER — Telehealth: Payer: Self-pay | Admitting: Neurology

## 2015-01-04 NOTE — Telephone Encounter (Signed)
Patient called to request earlier appointment with Dr. Anne Hahn. States she talked to Dr. Anne Hahn last week and he wanted to get her in sooner for an appointment since she was having more frequent headaches. Also having problems with Restless Leg Syndrome. Has aggravated nerve per Chiropractor.

## 2015-01-04 NOTE — Telephone Encounter (Signed)
I called the patient. Appointment scheduled 9/6.

## 2015-01-09 ENCOUNTER — Ambulatory Visit: Payer: Medicare Other | Attending: Physical Medicine and Rehabilitation | Admitting: Occupational Therapy

## 2015-01-09 ENCOUNTER — Ambulatory Visit (INDEPENDENT_AMBULATORY_CARE_PROVIDER_SITE_OTHER): Payer: Medicare Other | Admitting: Neurology

## 2015-01-09 ENCOUNTER — Encounter: Payer: Self-pay | Admitting: Neurology

## 2015-01-09 VITALS — BP 160/92 | HR 72 | Ht 60.0 in | Wt 197.0 lb

## 2015-01-09 DIAGNOSIS — R531 Weakness: Secondary | ICD-10-CM | POA: Diagnosis present

## 2015-01-09 DIAGNOSIS — G43019 Migraine without aura, intractable, without status migrainosus: Secondary | ICD-10-CM

## 2015-01-09 DIAGNOSIS — Z5181 Encounter for therapeutic drug level monitoring: Secondary | ICD-10-CM

## 2015-01-09 DIAGNOSIS — M79641 Pain in right hand: Secondary | ICD-10-CM | POA: Diagnosis not present

## 2015-01-09 DIAGNOSIS — G2581 Restless legs syndrome: Secondary | ICD-10-CM

## 2015-01-09 DIAGNOSIS — M5431 Sciatica, right side: Secondary | ICD-10-CM

## 2015-01-09 MED ORDER — BUTORPHANOL TARTRATE 10 MG/ML NA SOLN: 1.0000 | Freq: Four times a day (QID) | NASAL | Status: DC | PRN

## 2015-01-09 MED ORDER — METOPROLOL SUCCINATE ER 25 MG PO TB24: 25.0000 mg | ORAL_TABLET | Freq: Every day | ORAL | Status: DC

## 2015-01-09 MED ORDER — PREDNISONE 5 MG PO TABS: ORAL_TABLET | ORAL | Status: DC

## 2015-01-09 NOTE — Patient Instructions (Signed)
Tendon glides, MEd N glides and opposition   Joint protection principles  AE trng

## 2015-01-09 NOTE — Progress Notes (Signed)
Reason for visit: Headache  Jessica Cole is an 64 y.o. female  History of present illness:  Jessica Cole is a 64 year old right-handed white female with a history of migraine headaches and diabetes. The patient has had some increased stress during the month of August, she had 18 headache days a month. Normally, she may have 5 or 6 days a month with headache. She will often times do quite well throughout the month, and then she will have 5 days straight with the headache. She was placed on Ultram, this was not very helpful. She continues to have restless leg syndrome, she is on relatively low dose Requip taking 1 mg 3 times daily. The patient began having sciatica type pain on the right in June 2016. The patient indicates the pain begins in the right buttocks, and goes down the leg to the ankle. The patient denies any numbness or weakness in the leg. The pain is worse with sitting, but still is present with walking. It will be painful to stoop over. The patient returns for an evaluation. Today, she reports some left-sided chest pain, but she indicates that the pain is a sharp quality that comes on with taking a deep breath. She has not contacted her primary care physician concerning this.  Past Medical History  Diagnosis Date  . Headache(784.0)   . Obesity   . OSA on CPAP   . Environmental allergies   . Renal calculi   . Plantar fasciitis     right  . Glaucoma   . Renal calculi   . Colon polyps   . Degenerative arthritis   . RLS (restless legs syndrome) 08/23/2014    Past Surgical History  Procedure Laterality Date  . Abdominal hysterectomy    . Tonsillectomy    . Appendectomy    . Breast biopsy    . Arthroscopic surgery, knee Left     Family History  Problem Relation Age of Onset  . Lung cancer Mother   . Congestive Heart Failure Father   . Depression Sister   . Rheum arthritis Sister   . Headache Maternal Grandfather   . Migraines Maternal Grandfather   .  Headache Maternal Uncle   . Diabetes    . Heart disease    . Hypertension      Social history:  reports that she has never smoked. She has never used smokeless tobacco. She reports that she does not drink alcohol or use illicit drugs.    Allergies  Allergen Reactions  . Dexamethasone     Medications:  Prior to Admission medications   Medication Sig Start Date End Date Taking? Authorizing Provider  aspirin 81 MG tablet Take 81 mg by mouth 2 (two) times daily.   Yes Historical Provider, MD  Azelastine-Fluticasone (DYMISTA) 137-50 MCG/ACT SUSP Place into the nose 2 (two) times daily.   Yes Historical Provider, MD  B Complex Vitamins (VITAMIN B COMPLEX PO) Take by mouth daily.   Yes Historical Provider, MD  busPIRone (BUSPAR) 15 MG tablet Take 15 mg by mouth 2 (two) times daily.   Yes Historical Provider, MD  diclofenac sodium (VOLTAREN) 1 % GEL Apply 4 g topically 2 (two) times daily. 08/28/14  Yes Max T Hyatt, DPM  DULoxetine (CYMBALTA) 30 MG capsule Take 60 mg by mouth daily.    Yes Historical Provider, MD  enalapril (VASOTEC) 5 MG tablet Take 5 mg by mouth daily.   Yes Historical Provider, MD  estradiol (VIVELLE-DOT) 0.025 MG/24HR Place  1 patch onto the skin 2 (two) times a week.   Yes Historical Provider, MD  etodolac (LODINE) 400 MG tablet Take 400 mg by mouth 2 (two) times daily. 02/21/14 02/21/15 Yes Historical Provider, MD  fluticasone Aleda Grana) 50 MCG/ACT nasal spray  04/12/14  Yes Historical Provider, MD  gabapentin (NEURONTIN) 300 MG capsule Take 1,200 mg by mouth 4 (four) times daily.    Yes Historical Provider, MD  latanoprost (XALATAN) 0.005 % ophthalmic solution 1 drop at bedtime.   Yes Historical Provider, MD  levothyroxine (SYNTHROID, LEVOTHROID) 125 MCG tablet Take 125 mcg by mouth daily before breakfast.   Yes Historical Provider, MD  metFORMIN (GLUCOPHAGE) 500 MG tablet Take 500 mg by mouth 2 (two) times daily with a meal.   Yes Historical Provider, MD  nystatin  (MYCOSTATIN/NYSTOP) 100000 UNIT/GM POWD Apply topically.   Yes Historical Provider, MD  Omega-3 Fatty Acids (FISH OIL ULTRA) 1000 MG CAPS Take 2 each by mouth daily.   Yes Historical Provider, MD  rOPINIRole (REQUIP) 1 MG tablet Take 1 mg by mouth 3 (three) times daily.   Yes Historical Provider, MD  temazepam (RESTORIL) 30 MG capsule Take 30 mg by mouth at bedtime as needed for sleep.   Yes Historical Provider, MD    ROS:  Out of a complete 14 system review of symptoms, the patient complains only of the following symptoms, and all other reviewed systems are negative.  Drooling Eye itching, eye redness Cough, chest tightness Chest pain Constipation Restless legs, sleep apnea, frequent waking, daytime sleepiness, snoring Environmental allergies Joint pain, joint swelling, muscle cramps Bruising easily Headache, numbness Anxiety  Blood pressure 160/92, pulse 72, height 5' (1.524 m), weight 197 lb (89.359 kg).  Physical Exam  General: The patient is alert and cooperative at the time of the examination. The patient is moderately to markedly obese.  Neuromuscular: The patient does not have significant discomfort with internal or external rotation of the hips.  Skin: No significant peripheral edema is noted.   Neurologic Exam  Mental status: The patient is alert and oriented x 3 at the time of the examination. The patient has apparent normal recent and remote memory, with an apparently normal attention span and concentration ability.   Cranial nerves: Facial symmetry is present. Speech is normal, no aphasia or dysarthria is noted. Extraocular movements are full. Visual fields are full.  Motor: The patient has good strength in all 4 extremities.  Sensory examination: Soft touch sensation is symmetric on the face, arms, and legs.  Coordination: The patient has good finger-nose-finger and heel-to-shin bilaterally.  Gait and station: The patient has a normal gait. Tandem gait is  normal. Romberg is negative. No drift is seen.  Reflexes: Deep tendon reflexes are symmetric, but are depressed.   Assessment/Plan:  1. Intractable headache  2. Right-sided sciatica  3. History of diabetes  The patient is having a new symptom of right-sided leg pain. The patient will be given a brief prednisone Dosepak, 5 mg 6 day pack. She will be set up for nerve conduction studies on both legs, EMG on the right leg. The patient was given a prescription for Stadol nasal spray, she is not to get other opiate medications from other doctors. A urine drug screen will be done today. The patient is reporting some left-sided chest pain, this worsens with inspiration, and likely is a chest wall pain. I have asked her to contact her primary care physician, however, regarding this pain. If the right sided leg  pain continues, MRI evaluation of the lumbar spine may be done in the future. A prescription was given for Toprol 25 mg tablet, taking 1 daily for the headache.  Marlan Palau MD 01/09/2015 7:41 PM  [facility] [address] [place], [place] [address]  Phone [phone] Fax [phone]

## 2015-01-09 NOTE — Therapy (Signed)
[facility] [address] [place], [address] Phone: [phone]   Fax:  [phone]  Occupational Therapy Treatment and discharge  Patient Details  Name: Jessica Cole MRN: [id number] Date of Birth: 03/23/1951 Referring Provider:  Emmaline Kluver.,*  Encounter Date: 01/09/2015      OT End of Session - 01/09/15 1320    Visit Number 2   Number of Visits 2   Date for OT Re-Evaluation 01/09/15   OT Start Time 1250   OT Stop Time 1315   OT Time Calculation (min) 25 min   Activity Tolerance Patient tolerated treatment well   Behavior During Therapy [facility] for tasks assessed/performed      Past Medical History  Diagnosis Date  . Headache(784.0)   . Obesity   . OSA on CPAP   . Environmental allergies   . Renal calculi   . Plantar fasciitis     right  . Glaucoma   . Renal calculi   . Colon polyps   . Degenerative arthritis   . RLS (restless legs syndrome) 08/23/2014    Past Surgical History  Procedure Laterality Date  . Abdominal hysterectomy    . Tonsillectomy    . Appendectomy    . Breast biopsy    . Arthroscopic surgery, knee Left     There were no vitals filed for this visit.  Visit Diagnosis:  Pain of right hand  Weakness generalized      Subjective Assessment - 01/09/15 1314    Subjective  Pain at the worse the last 2 wks was about 1-2/10 - no triggering - did see DR Jefm Bryant and do not need to go back until in 6 wks - I forgot my exericises when I went to the beach  with my sister but did it when I come back - did  not like those little splint you made for my fingers - wearing the long one the MD gave me at night time  and my CT splint    Patient Stated Goals My sister has RA and she started out like this - I don't want to follow the same path- want the pain and triggering to stop   Currently in Pain? Yes   Pain Score 1    Pain Location Hand   Pain Orientation Right   Pain  Descriptors / Indicators Tender   Pain Type Chronic pain            [facility] OT Assessment - 01/09/15 0001    Strength   Right Hand Grip (lbs) 50   Right Hand Lateral Pinch 9 lbs   Right Hand 3 Point Pinch 9 lbs   Left Hand Grip (lbs) 48   Left Hand Lateral Pinch 12 lbs   Left Hand 3 Point Pinch 11 lbs   Right Hand AROM   R Index  MCP 0-90 85 Degrees   R Long  MCP 0-90 90 Degrees                  OT Treatments/Exercises (OP) - 01/09/15 0001    ADLs   ADL Comments Reviewed again with pt joint protection principles and AE - for  decreasing pain and increase ease - as well as prevent future flare ups - larger joints and avoid sustained tight grip - builtup handles    Hand Exercises   Other Hand Exercises Tendon glides , oppositon    Other Hand Exercises Med N  glides - pt needed with HEP reviewed min A - forgot about some and did not understand Nerve glide                OT Education - 18-Jan-2015 1319    Education provided Yes   Education Details HEP and joint protrection princiles    Person(s) Educated Patient   Methods Explanation;Demonstration;Tactile cues;Verbal cues   Comprehension Returned demonstration;Verbalized understanding;Verbal cues required          OT Short Term Goals - January 18, 2015 1324    OT SHORT TERM GOAL #1   Title Pain on PRHWE improve by at least 10 points   Baseline PRWHE  16/50   Status Achieved   OT SHORT TERM GOAL #2   Title Pt to be ind in use of splints and HEP to decrease pain    Status Achieved           OT Long Term Goals - 18-Jan-2015 1325    OT LONG TERM GOAL #1   Title Pt to verbalize 3 joint protection and AE use to decrease pain and increase use of R dominant hand    Baseline joint protection but not AE    Status Partially Met               Plan - 2015-01-18 1320    Clinical Impression Statement Pt report pain decrease - still little tender but not much - no triggering for while - AROM of R 2nd MC to 85 and  3rd to WNL - from 80's . Grip strength this date R 50 , L 48 - pt report still sleeping with CT splint and using MD  finger splint for trigger fingers to prevent  sleeping in fist.  Reviewed with pt again HEP for tendon glides , opposition and Med N glide -  as well as joint protrection principles to prevent flare up again - pt verbalize and demo understanding and discharge at this time with home program    Rehab Potential Fair   OT Treatment/Interventions Self-care/ADL training;Contrast Bath;Ultrasound;Therapeutic exercise;Manual Therapy;Splinting;Patient/family education   Plan discharge with home program    OT Home Exercise Plan see pt instruction   Consulted and Agree with Plan of Care Patient          G-Codes - 2015-01-18 1326    Functional Assessment Tool Used PRWHE pain and function, ROM , grip and prehension and clinical judgement    Functional Limitation Self care   Self Care Current Status (Q3009) At least 1 percent but less than 20 percent impaired, limited or restricted   Self Care Goal Status (Q3300) At least 1 percent but less than 20 percent impaired, limited or restricted   Self Care Discharge Status [phone]) At least 1 percent but less than 20 percent impaired, limited or restricted      Problem List Patient Active Problem List   Diagnosis Date Noted  . RLS (restless legs syndrome) 08/23/2014  . Depression 09/29/2013  . Insomnia 09/29/2013  . Intractable migraine without aura 02/18/2013    Rosalyn Gess OTR/L,CLT 2015/01/18, 1:28 PM  [facility] [address], [place], [address] Phone: [phone]   Fax:  [phone]

## 2015-01-09 NOTE — Patient Instructions (Addendum)
We will place you on a prednisone Dosepak over the next 6 days, said she will for EMG and nerve conduction study evaluation to evaluate the right leg pain. I have written a prescription for Stadol.  Sciatica Sciatica is pain, weakness, numbness, or tingling along the path of the sciatic nerve. The nerve starts in the lower back and runs down the back of each leg. The nerve controls the muscles in the lower leg and in the back of the knee, while also providing sensation to the back of the thigh, lower leg, and the sole of your foot. Sciatica is a symptom of another medical condition. For instance, nerve damage or certain conditions, such as a herniated disk or bone spur on the spine, pinch or put pressure on the sciatic nerve. This causes the pain, weakness, or other sensations normally associated with sciatica. Generally, sciatica only affects one side of the body. CAUSES   Herniated or slipped disc.  Degenerative disk disease.  A pain disorder involving the narrow muscle in the buttocks (piriformis syndrome).  Pelvic injury or fracture.  Pregnancy.  Tumor (rare). SYMPTOMS  Symptoms can vary from mild to very severe. The symptoms usually travel from the low back to the buttocks and down the back of the leg. Symptoms can include:  Mild tingling or dull aches in the lower back, leg, or hip.  Numbness in the back of the calf or sole of the foot.  Burning sensations in the lower back, leg, or hip.  Sharp pains in the lower back, leg, or hip.  Leg weakness.  Severe back pain inhibiting movement. These symptoms may get worse with coughing, sneezing, laughing, or prolonged sitting or standing. Also, being overweight may worsen symptoms. DIAGNOSIS  Your caregiver will perform a physical exam to look for common symptoms of sciatica. He or she may ask you to do certain movements or activities that would trigger sciatic nerve pain. Other tests may be performed to find the cause of the  sciatica. These may include:  Blood tests.  X-rays.  Imaging tests, such as an MRI or CT scan. TREATMENT  Treatment is directed at the cause of the sciatic pain. Sometimes, treatment is not necessary and the pain and discomfort goes away on its own. If treatment is needed, your caregiver may suggest:  Over-the-counter medicines to relieve pain.  Prescription medicines, such as anti-inflammatory medicine, muscle relaxants, or narcotics.  Applying heat or ice to the painful area.  Steroid injections to lessen pain, irritation, and inflammation around the nerve.  Reducing activity during periods of pain.  Exercising and stretching to strengthen your abdomen and improve flexibility of your spine. Your caregiver may suggest losing weight if the extra weight makes the back pain worse.  Physical therapy.  Surgery to eliminate what is pressing or pinching the nerve, such as a bone spur or part of a herniated disk. HOME CARE INSTRUCTIONS   Only take over-the-counter or prescription medicines for pain or discomfort as directed by your caregiver.  Apply ice to the affected area for 20 minutes, 3-4 times a day for the first 48-72 hours. Then try heat in the same way.  Exercise, stretch, or perform your usual activities if these do not aggravate your pain.  Attend physical therapy sessions as directed by your caregiver.  Keep all follow-up appointments as directed by your caregiver.  Do not wear high heels or shoes that do not provide proper support.  Check your mattress to see if it is  too soft. A firm mattress may lessen your pain and discomfort. SEEK IMMEDIATE MEDICAL CARE IF:   You lose control of your bowel or bladder (incontinence).  You have increasing weakness in the lower back, pelvis, buttocks, or legs.  You have redness or swelling of your back.  You have a burning sensation when you urinate.  You have pain that gets worse when you lie down or awakens you at  night.  Your pain is worse than you have experienced in the past.  Your pain is lasting longer than 4 weeks.  You are suddenly losing weight without reason. MAKE SURE YOU:  Understand these instructions.  Will watch your condition.  Will get help right away if you are not doing well or get worse. Document Released: 04/15/2001 Document Revised: 10/21/2011 Document Reviewed: 08/31/2011 [facility] Patient Information 2015 [place], [place]. This information is not intended to replace advice given to you by your health care provider. Make sure you discuss any questions you have with your health care provider.

## 2015-01-10 LAB — 733690 12+OXYCODONE+CRT-SCR
Amphetamine Screen, Ur: NEGATIVE ng/mL
BARBITURATE SCRN UR: NEGATIVE ng/mL
BENZODIAZEPINE SCREEN, URINE: NEGATIVE ng/mL
CANNABINOIDS UR QL SCN: NEGATIVE ng/mL
Cocaine(Metab.)Screen, Urine: NEGATIVE ng/mL
Creatinine(Crt), U: 91.3 mg/dL (ref 20.0–300.0)
Fentanyl, Urine: NEGATIVE pg/mL
Meperidine Screen, Urine: NEGATIVE ng/mL
Methadone Scn, Ur: NEGATIVE ng/mL
Opiate Scrn, Ur: NEGATIVE ng/mL
Oxycodone+Oxymorphone Ur Ql Scn: NEGATIVE ng/mL
PCP Scrn, Ur: NEGATIVE ng/mL
PH UR, DRUG SCRN: 7 (ref 4.5–8.9)
Propoxyphene, Screen: NEGATIVE ng/mL
Tramadol Ur Ql Scn: POSITIVE ng/mL

## 2015-01-11 ENCOUNTER — Encounter: Payer: Medicare Other | Admitting: Occupational Therapy

## 2015-01-18 DIAGNOSIS — I350 Nonrheumatic aortic (valve) stenosis: Secondary | ICD-10-CM | POA: Insufficient documentation

## 2015-01-22 ENCOUNTER — Telehealth: Payer: Self-pay | Admitting: Neurology

## 2015-01-22 MED ORDER — PREDNISONE 10 MG PO TABS: ORAL_TABLET | ORAL | Status: DC

## 2015-01-22 NOTE — Telephone Encounter (Signed)
I called the patient. She is having right leg pain. It is the same as the pain she had when she was last in the office. She is also out of her stadol and tramadol. I explained that she cannot get a refill for stadol until 10/4 and that Dr. Anne Hahn' office note states Tramadol did not work for her. He did not write for anymore Tramadol. She is not currently having a migraine. She would like to know what else she can have for pain. I explained that Dr. Anne Hahn is out of the office but would back tomorrow. She verbalized understanding.

## 2015-01-22 NOTE — Telephone Encounter (Signed)
I called patient. The patient is having ongoing headaches, her back and right leg pain continue. The prednisone Dosepak 5 mg 6 day pack helped for a couple days, I may repeat this as a 10 mg 12 day pack. The patient will be coming back in for EMG evaluation. This will be done on September 29.

## 2015-01-22 NOTE — Telephone Encounter (Signed)
Patient is calling and states she need something for pain for her sciatic nerve pain.  She states she is out  butorthanol and tremodol.  Please call.

## 2015-02-01 ENCOUNTER — Ambulatory Visit (INDEPENDENT_AMBULATORY_CARE_PROVIDER_SITE_OTHER): Payer: Medicare Other | Admitting: Neurology

## 2015-02-01 ENCOUNTER — Encounter: Payer: Self-pay | Admitting: Neurology

## 2015-02-01 ENCOUNTER — Ambulatory Visit (INDEPENDENT_AMBULATORY_CARE_PROVIDER_SITE_OTHER): Payer: Self-pay | Admitting: Neurology

## 2015-02-01 DIAGNOSIS — M5431 Sciatica, right side: Secondary | ICD-10-CM

## 2015-02-01 MED ORDER — TRAMADOL HCL 50 MG PO TABS: 50.0000 mg | ORAL_TABLET | Freq: Four times a day (QID) | ORAL | Status: DC | PRN

## 2015-02-01 NOTE — Progress Notes (Signed)
Jessica Cole comes in today for EMG nerve conduction study evaluation, she continues to have back and right leg pain. This was transiently responsive to prednisone.  EMG and nerve conduction study done today was unremarkable, no evidence of a lumbar radiculopathy is seen.  The patient will be given a small prescription for Ultram for pain, she will be sent for MRI of the lumbar spine. We will consider the possibility of an epidural steroid injection depending upon the results of the above study.

## 2015-02-01 NOTE — Procedures (Signed)
     HISTORY:  Jessica Cole is a 64 year old patient with a history of low back pain with right-sided sciatica. The pain has continued, transiently responsive to use of prednisone. The patient is being evaluated for a possible lumbar radiculopathy.  NERVE CONDUCTION STUDIES:  Nerve conduction studies were performed on both lower extremities. The distal motor latencies and motor amplitudes for the peroneal and posterior tibial nerves were within normal limits. The nerve conduction velocities for these nerves were also normal. The H reflex latencies were normal. The sensory latencies for the peroneal nerves were within normal limits.   EMG STUDIES:  EMG study was performed on the right lower extremity:  The tibialis anterior muscle reveals 2 to 4K motor units with full recruitment. No fibrillations or positive waves were seen. The peroneus tertius muscle reveals 2 to 4K motor units with full recruitment. No fibrillations or positive waves were seen. The medial gastrocnemius muscle reveals 1 to 3K motor units with full recruitment. No fibrillations or positive waves were seen. The vastus lateralis muscle reveals 2 to 4K motor units with full recruitment. No fibrillations or positive waves were seen. The iliopsoas muscle reveals 2 to 4K motor units with full recruitment. No fibrillations or positive waves were seen. The biceps femoris muscle (long head) reveals 2 to 4K motor units with full recruitment. No fibrillations or positive waves were seen. The lumbosacral paraspinal muscles were tested at 3 levels, and revealed no abnormalities of insertional activity at all 3 levels tested. There was good relaxation.   IMPRESSION:  Nerve conduction studies done on both lower extremities was unremarkable, no evidence of a peripheral neuropathy is seen. EMG evaluation of the right lower extremity was unremarkable, without evidence of an overlying lumbosacral radiculopathy.  Marlan Palau  MD 02/01/2015 1:22 PM  [facility] [address] [place], [place] [address]  Phone [phone] Fax [phone]

## 2015-02-01 NOTE — Progress Notes (Signed)
Please refer to EMG and nerve conduction study procedure note. 

## 2015-02-14 ENCOUNTER — Other Ambulatory Visit: Payer: Self-pay | Admitting: Internal Medicine

## 2015-02-14 DIAGNOSIS — Z1231 Encounter for screening mammogram for malignant neoplasm of breast: Secondary | ICD-10-CM

## 2015-02-16 ENCOUNTER — Ambulatory Visit
Admission: RE | Admit: 2015-02-16 | Discharge: 2015-02-16 | Disposition: A | Discharge status: Home / Self Care | Payer: Medicare Other | Source: Ambulatory Visit | Attending: Neurology | Admitting: Neurology

## 2015-02-16 DIAGNOSIS — M5431 Sciatica, right side: Secondary | ICD-10-CM

## 2015-02-18 ENCOUNTER — Telehealth: Payer: Self-pay | Admitting: Neurology

## 2015-02-18 DIAGNOSIS — M5441 Lumbago with sciatica, right side: Secondary | ICD-10-CM

## 2015-02-18 NOTE — Telephone Encounter (Signed)
  I called the patient. The MRI shows slight compression of the right L3 nerve root. We will get her set up for an epidural steroid injection.   MRI lumbar 02/16/15:  IMPRESSION: This is an abnormal MRI of the lumbar spine without contrast showing: 1. 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  2. Milder degenerative changes at other lumbar levels as described above does not lead to any nerve root impingement at other levels.

## 2015-02-20 ENCOUNTER — Other Ambulatory Visit: Payer: Self-pay | Admitting: Neurology

## 2015-02-20 DIAGNOSIS — M5441 Lumbago with sciatica, right side: Secondary | ICD-10-CM

## 2015-02-23 ENCOUNTER — Ambulatory Visit: Payer: Medicare Other | Admitting: Neurology

## 2015-02-27 ENCOUNTER — Telehealth: Payer: Self-pay | Admitting: Neurology

## 2015-02-27 ENCOUNTER — Other Ambulatory Visit: Payer: Self-pay | Admitting: Neurology

## 2015-02-27 ENCOUNTER — Ambulatory Visit
Admission: RE | Admit: 2015-02-27 | Discharge: 2015-02-27 | Disposition: A | Discharge status: Home / Self Care | Payer: Medicare Other | Source: Ambulatory Visit | Attending: Neurology | Admitting: Neurology

## 2015-02-27 DIAGNOSIS — M5441 Lumbago with sciatica, right side: Secondary | ICD-10-CM

## 2015-02-27 MED ORDER — IOHEXOL 180 MG/ML  SOLN
1.0000 mL | Freq: Once | INTRAMUSCULAR | Status: DC | PRN
  Administered 2015-02-27: 1 mL via EPIDURAL

## 2015-02-27 MED ORDER — OXYCODONE HCL 10 MG PO TABS: 10.0000 mg | ORAL_TABLET | Freq: Four times a day (QID) | ORAL | Status: DC | PRN

## 2015-02-27 MED ORDER — METHYLPREDNISOLONE ACETATE 40 MG/ML INJ SUSP (RADIOLOG
120.0000 mg | Freq: Once | INTRAMUSCULAR | Status: AC
  Administered 2015-02-27: 120 mg via EPIDURAL

## 2015-02-27 NOTE — Telephone Encounter (Signed)
Pt called returning Kelby's call

## 2015-02-27 NOTE — Telephone Encounter (Signed)
Patient is calling as she had shot of Cortizone is her back and needs something.  She states that tramadol does not work for her.  Please call.

## 2015-02-27 NOTE — Telephone Encounter (Signed)
The patient has had an epidural, complains of increased pain following this. Ultram offers no benefit. She does not have anymore Stadol. I will discontinue the Ultram and Stadol, we will use oxycodone, 10 mg tablets, 40 tablets a month. If the pain continues, a referral to a surgeon may be in order.

## 2015-02-27 NOTE — Telephone Encounter (Signed)
I called the patient and left a voicemail asking her to call me back.  

## 2015-02-27 NOTE — Telephone Encounter (Signed)
I called the patient. She had an epidural steroid injection this morning. She states that the nurse told her that her legs would probably hurt a little worse for about 4-5 days. She denies any numbness in her legs and is able to walk. She states they are just painful. She states the Tramadol does not work for her and would like for Dr. Anne HahnWillis to give her something for the pain.

## 2015-02-27 NOTE — Telephone Encounter (Signed)
Patient is returning your call.  Please call back [phone].  Thanks!

## 2015-02-27 NOTE — Discharge Instructions (Signed)

## 2015-02-28 ENCOUNTER — Telehealth: Payer: Self-pay

## 2015-02-28 NOTE — Telephone Encounter (Signed)
Rx ready for pick up. 

## 2015-03-01 ENCOUNTER — Ambulatory Visit: Payer: Medicare Other | Attending: Internal Medicine

## 2015-03-05 ENCOUNTER — Other Ambulatory Visit: Payer: Self-pay | Admitting: Neurology

## 2015-03-05 ENCOUNTER — Telehealth: Payer: Self-pay | Admitting: Neurology

## 2015-03-05 DIAGNOSIS — M5441 Lumbago with sciatica, right side: Secondary | ICD-10-CM

## 2015-03-05 NOTE — Telephone Encounter (Signed)
Pt called and states she had an injection last week and was told that she would hurt for 4-5 days but it has gone over that and she is still hurting. She would like to talk to someone about it. Walking , standing , laying in bed all hurt her. Please call and advise [phone]

## 2015-03-05 NOTE — Telephone Encounter (Signed)
I called the patient. She is still having pain. She states the pain is the same type of pain she had prior to the injection. It does not seem like the injection has helped. She states she has been taking the Oxycodone and it helps for a little while. She is only taking about 1 Oxycodone daily. I advised she can take it up to every 6 hours.

## 2015-03-05 NOTE — Telephone Encounter (Signed)
I called patient. The epidural steroid injection was done last week, the patient is not gaining any benefit from it, we will try another injection, if this is not helpful, we may consider a surgical referral.

## 2015-03-15 ENCOUNTER — Ambulatory Visit
Admission: RE | Admit: 2015-03-15 | Discharge: 2015-03-15 | Disposition: A | Discharge status: Home / Self Care | Payer: Medicare Other | Source: Ambulatory Visit | Attending: Neurology | Admitting: Neurology

## 2015-03-15 ENCOUNTER — Encounter: Payer: Self-pay | Admitting: Radiology

## 2015-03-15 DIAGNOSIS — M5441 Lumbago with sciatica, right side: Secondary | ICD-10-CM

## 2015-03-15 DIAGNOSIS — K760 Fatty (change of) liver, not elsewhere classified: Secondary | ICD-10-CM | POA: Insufficient documentation

## 2015-03-15 MED ORDER — METHYLPREDNISOLONE ACETATE 40 MG/ML INJ SUSP (RADIOLOG
120.0000 mg | Freq: Once | INTRAMUSCULAR | Status: AC
  Administered 2015-03-15: 120 mg via EPIDURAL

## 2015-03-15 MED ORDER — IOHEXOL 180 MG/ML  SOLN
1.0000 mL | Freq: Once | INTRAMUSCULAR | Status: DC | PRN
  Administered 2015-03-15: 1 mL via EPIDURAL

## 2015-03-15 NOTE — Discharge Instructions (Signed)

## 2015-03-21 NOTE — Addendum Note (Signed)
Addended by: Stephanie AcreWILLIS, CHARLES on: 03/21/2015 04:56 PM   Modules accepted: Orders

## 2015-03-21 NOTE — Telephone Encounter (Signed)
Patient also states that she is having pain Left lower back.

## 2015-03-21 NOTE — Telephone Encounter (Signed)
Patient called to advise that the last injection worked for 2 days then on end of 2nd day, twinges in back and each day since it's gotten a little worse, not as bad as it was but has a feeling it's going to get there.

## 2015-03-21 NOTE — Telephone Encounter (Signed)
I called the patient. The patient needs to have pain after the injection. I will send her for a surgical referral.

## 2015-04-03 ENCOUNTER — Telehealth: Payer: Self-pay | Admitting: Neurology

## 2015-04-03 NOTE — Telephone Encounter (Signed)
I will write another Rx for the oxycodone. She will be sent for physical therapy. The oral prednisone and the epidural injections did not help previously.

## 2015-04-03 NOTE — Telephone Encounter (Signed)
Patient is calling as she need a pain medication for her back, legs, buttocks.  Dr. Anne HahnWillis gave her Oxycodone HCI 10 mg before and she stated that it helped a lot but not completely, She saw Dr. Marikay Alaravid Jones yesterday and he told her her problem was not her disk but doesn't know as yet what the problem is. Please call.

## 2015-04-04 MED ORDER — OXYCODONE HCL 10 MG PO TABS: 10.0000 mg | ORAL_TABLET | Freq: Four times a day (QID) | ORAL | Status: DC | PRN

## 2015-04-10 ENCOUNTER — Encounter: Payer: Self-pay | Admitting: Physical Therapy

## 2015-04-10 ENCOUNTER — Ambulatory Visit: Payer: Medicare Other | Attending: Physical Medicine and Rehabilitation | Admitting: Physical Therapy

## 2015-04-10 DIAGNOSIS — M25551 Pain in right hip: Secondary | ICD-10-CM | POA: Diagnosis not present

## 2015-04-10 DIAGNOSIS — M25552 Pain in left hip: Secondary | ICD-10-CM | POA: Diagnosis present

## 2015-04-10 DIAGNOSIS — R531 Weakness: Secondary | ICD-10-CM | POA: Diagnosis present

## 2015-04-10 DIAGNOSIS — M62838 Other muscle spasm: Secondary | ICD-10-CM | POA: Insufficient documentation

## 2015-04-10 NOTE — Therapy (Signed)
[facility] PH[facility] MEDICINE [address] [place], [place], [address] Phone: [phone]   Fax:  [phone]  Physical Therapy Evaluation  Patient Details  Name: Jessica HaggisGail Thacker Noah MRN: [id number] Date of Birth: Jan 29, 1951 Referring Provider: Tia AlertJones, David S MD  Encounter Date: 04/10/2015      PT End of Session - 04/10/15 0900    Visit Number 1   Number of Visits 12   Date for PT Re-Evaluation 05/22/15   Authorization Type G code 1   Authorization Time Period 10   PT Start Time 0800   PT Stop Time 0900   PT Time Calculation (min) 60 min   Activity Tolerance Patient tolerated treatment well   Behavior During Therapy [facility] for tasks assessed/performed      Past Medical History  Diagnosis Date  . Headache(784.0)   . Obesity   . OSA on CPAP   . Environmental allergies   . Renal calculi   . Plantar fasciitis     right  . Glaucoma   . Renal calculi   . Colon polyps   . Degenerative arthritis   . RLS (restless legs syndrome) 08/23/2014    Past Surgical History  Procedure Laterality Date  . Abdominal hysterectomy    . Tonsillectomy    . Appendectomy    . Breast biopsy    . Arthroscopic surgery, knee Left     There were no vitals filed for this visit.  Visit Diagnosis:  Bilateral hip pain - Plan: PT plan of care cert/re-cert  Spasm of muscle - Plan: PT plan of care cert/re-cert      Subjective Assessment - 04/10/15 0806    Subjective Patient reports she is having symptoms in her lower back and right hip/buttock into right LE thigh, no pain at the moment in her left LE.    Pertinent History Patient reports she has had hip and leg pain for the past 7 months, insidious onset. She went to the chiropractor and did not resolve her pain. The pain began as pain in right buttock and then gradually began to radiate into right LE and then to left LE. She has had 2 injections in her lower back with minimal results. ( November  2016). Since  then her symptoms have continued worsen to prior level of pain.   Limitations Sitting;Standing;Walking;House hold activities;Other (comment)  personal care, putting on socks, pants   How long can you sit comfortably? 15 min.   How long can you stand comfortably? with shifitng weight 10 min.   How long can you walk comfortably? 30 min.    Diagnostic tests MRI and X rays   Patient Stated Goals patient would like to decreased leg pain  and be able to perform normal activity without leg symptoms bothering her, especially at night   Currently in Pain? Yes   Pain Score 6    Pain Location Back   Pain Orientation Right   Pain Descriptors / Indicators Aching;Nagging   Pain Type Chronic pain   Pain Onset More than a month ago   Pain Frequency Intermittent   Aggravating Factors  prolonged sitting, standing, walking   Pain Relieving Factors heat, ice, moving   Effect of Pain on Daily Activities Patient is limited in daily activities             [facility] PT Assessment - 04/10/15 0825    Assessment   Medical Diagnosis bilateral hip pain M25.551   Referring Provider Yetta BarreJones,  Kermit Balo MD   Onset Date/Surgical Date 05/05/14   Hand Dominance Right   Next MD Visit unknown   Prior Therapy yes, 09/2014 for leg pain   Precautions   Precautions None   Restrictions   Weight Bearing Restrictions No   Balance Screen   Has the patient fallen in the past 6 months Yes   How many times? 1  lost balance while stooping down in a store and fell over   Has the patient had a decrease in activity level because of a fear of falling?  No   Is the patient reluctant to leave their home because of a fear of falling?  No   Home [employer] information centre manager residence   Living Arrangements Spouse/significant other   Home Access Stairs to enter   Entrance Stairs-Number of Steps 6  back entrance   Entrance Stairs-Rails --  both   Home Layout One level   Prior Function   Level of  Independence Independent   Vocation Retired   Leisure likes to bake, sew, read, Print production planner Status Within Functional Limits for tasks assessed      Objective: Gait: ambulating without AD without loss of balance noted, good cadence and dynamic balance  AROM: lumbar spine limited forward flexion 50% with reaching and climbing back up LE's, extension 25% decrease without increased pain in back/hips reported, lateral flexion right increased right sided hip/back symptoms, left lateral flexion without increased symptoms, LE's hip ROM decreased ER on right 25% as compared to left with reported increased hip pain, SLR bilateral to 90 without reproduction of symptoms Strength: LE's grossly major muscle groups WNL's without pain reproduced Palpation: right hip/piriformis and gluteal muscles with spasms and reproduction of symptoms Special tests: negative SLR and crossed SLR, FABERS with increased pain in lateral hip right, no pain with left hip, piriformis + tenderness and pain with IR, + pain in right hip with hip flexed to 90 with resisted hip abduction   Treatment: Instructed in positioning, posture for sitting and lying with lumbar support/pillows, instructed in use of heat/ice for pain control, side lying clam x 10 reps 2x/day and standing back extension throughout the day to decreased back and hip pain Patient response to treatment: verbalized understanding of home instruction and demonstrated good technique with exercise following demonstration and with verbal cuing        PT Education - 04/10/15 0900    Education provided Yes   Education Details HEP for positioning for sitting/sleeping to decrease strain on hip/back   Person(s) Educated Patient   Methods Explanation;Demonstration;Verbal cues;Handout   Comprehension Verbalized understanding;Returned demonstration;Verbal cues required             PT Long Term Goals - 04/10/15 1220    PT LONG TERM GOAL  #1   Title Patient will be independent with home program without cuing for self management exercises and pain control, posture awareness by 05/22/2015   Baseline Patient has limited knowledge of appropriate pain control strategies and progressin of exercises to allow return to full function without difficulty/pain   Status New   PT LONG TERM GOAL #2   Title Patient will demonstrate improved self perceived disability on Modified Oswestry to 40% or less indicatiing improved function with daily activities and sleeping by 05/04/2015   Baseline Modified Oswestry score = 52% (severe self perceived disablity)   Status New   PT LONG TERM GOAL #3   Title Patient will  demonstrate improved self perceived disability on Modified Oswestry to 30% or less indicatiing improved function with daily activities and sleeping by 05/22/2015   Baseline Modified Oswestry score = 52%   Status New   PT LONG TERM GOAL #4   Title Patient will iimproved self perceived disability with LE's by improved function with daily activities with with less difficulty as demonstrated by LEFS score of 40/64 by 05/22/2015   Baseline LEFS = 12/64 (severe self perceived disability)   Status New               Plan - 05-10-2015 0900    Clinical Impression Statement Patient is a 64 year old female who presents with chroninc hip and low back pain that is worsening over the last 6 months. She has had limited results with previous interventions including injections for pain control. She has limited AROM in lumbar spine due to pain and bilateral hip pain/piriformis pain and spasms that limit her function with sitting, standing and walking activities. Her current impairment level is 55% based on LEFS and modified oswestry scores, clinical judgment, ROM deficits and pain scale. She has limited knoweldge of appropriate pain control strategies, progression of exercises in order to improve function with less difficulty and pain.    Pt will benefit  from skilled therapeutic intervention in order to improve on the following deficits Pain;Impaired flexibility;Difficulty walking;Increased muscle spasms;Decreased strength   Rehab Potential Fair   Clinical Impairments Affecting Rehab Potential (+) motivated (-) chronic condition of pain in hip/low back   PT Frequency 2x / week   PT Duration 6 weeks   PT Treatment/Interventions Manual techniques;Electrical Stimulation;Moist Heat;Patient/family education;Cryotherapy;Therapeutic exercise;Dry needling   PT Next Visit Plan Pain control, progressive exercise as tolerated   PT Home Exercise Plan positioning for sleep, sitting, exercises: clam in sidelying   Consulted and Agree with Plan of Care Patient          G-Codes - 2015-05-10 0900    Functional Assessment Tool Used pain scale, clinical judgment, modified oswestry, LEFS   Functional Limitation Mobility: Walking and moving around   Mobility: Walking and Moving Around Current Status (W0981) At least 40 percent but less than 60 percent impaired, limited or restricted   Mobility: Walking and Moving Around Goal Status [phone]) At least 1 percent but less than 20 percent impaired, limited or restricted       Problem List Patient Active Problem List   Diagnosis Date Noted  . Fatty infiltration of liver 03/15/2015  . Aortic valve stenosis, nonrheumatic 01/18/2015  . Sciatica of right side 01/09/2015  . Type 2 diabetes mellitus (HCC) 11/10/2014  . RLS (restless legs syndrome) 08/23/2014  . Neuritis or radiculitis due to rupture of lumbar intervertebral disc 06/13/2014  . Degeneration of intervertebral disc of lumbar region 06/13/2014  . Arthritis 01/23/2014  . Arthritis of knee, degenerative 11/15/2013  . Depression 09/29/2013  . Insomnia 09/29/2013  . Apnea, sleep 08/29/2013  . Calculus of kidney 08/29/2013  . Abnormal presence of protein in urine 08/29/2013  . Headache, migraine 08/29/2013  . BP (high blood pressure) 08/29/2013  . HLD  (hyperlipidemia) 08/29/2013  . Allergic rhinitis 08/29/2013  . Intractable migraine without aura 02/18/2013    Beacher May PT 05/10/2015, 12:42 PM  [facility] PHYSICAL AND SPORTS MEDICINE [address] [place], [address] Phone: [phone]   Fax:  [phone]  Name: Maryanna Stuber MRN: [id number] Date of Birth: 06/09/1950

## 2015-04-12 ENCOUNTER — Ambulatory Visit: Payer: Medicare Other | Admitting: Physical Therapy

## 2015-04-12 ENCOUNTER — Encounter: Payer: Self-pay | Admitting: Physical Therapy

## 2015-04-12 DIAGNOSIS — M62838 Other muscle spasm: Secondary | ICD-10-CM

## 2015-04-12 DIAGNOSIS — M25551 Pain in right hip: Secondary | ICD-10-CM

## 2015-04-12 DIAGNOSIS — M25552 Pain in left hip: Principal | ICD-10-CM

## 2015-04-12 NOTE — Therapy (Signed)
[place] [facility] [address] , [place], [address] Phone: [phone]   Fax:  [phone]  Physical Therapy Treatment  Patient Details  Name: Jessica Cole MRN: [id number] Date of Birth: 04/10/1951 Referring Provider: Tia AlertJones, David S MD  Encounter Date: 04/12/2015      PT End of Session - 04/12/15 0933    Visit Number 2   Number of Visits 12   Date for PT Re-Evaluation 05/22/15   Authorization Type G code 2   Authorization Time Period 10   PT Start Time 0845   PT Stop Time 0930   PT Time Calculation (min) 45 min   Activity Tolerance Patient tolerated treatment well   Behavior During Therapy [facility] for tasks assessed/performed      Past Medical History  Diagnosis Date  . Headache(784.0)   . Obesity   . OSA on CPAP   . Environmental allergies   . Renal calculi   . Plantar fasciitis     right  . Glaucoma   . Renal calculi   . Colon polyps   . Degenerative arthritis   . RLS (restless legs syndrome) 08/23/2014    Past Surgical History  Procedure Laterality Date  . Abdominal hysterectomy    . Tonsillectomy    . Appendectomy    . Breast biopsy    . Arthroscopic surgery, knee Left     There were no vitals filed for this visit.  Visit Diagnosis:  Bilateral hip pain  Spasm of muscle      Subjective Assessment - 04/12/15 0851    Subjective Patient reports she is doing exercise for hip in side lying. She reports she is having pain in having pain in right hip/lower back and into left side.   Limitations Sitting;Standing;Walking;House hold activities;Other (comment)   Patient Stated Goals patient would like to decreased leg pain  and be able to perform normal activity without leg symptoms bothering her, especially at night   Currently in Pain? Yes   Pain Score 6    Pain Location Other (Comment)  right buttock into right lower back   Pain Orientation Right   Pain Descriptors / Indicators Aching    Pain Type Chronic pain   Pain Onset More than a month ago   Pain Frequency Intermittent      Objective:  Posture standing guarded Sitting: unable to put full weight on right hip/buttock due to pain Palpation; + point tender over lateral aspect right hip and buttock even with slight pressure        [facility] Adult PT Treatment/Exercise - 04/12/15 0854    Exercises   Exercises Other Exercises   Other Exercises  Patient performed all exercises with guidance, instruction and verbal cuing of PT; bilateral hip bridging with ball between with controlled motion x 10 reps, hip abduction in supine hook lying with resistive band x 10 reps standing at counter for hip abduction and extension 2 sets of 5 reps with verbal cues and demonstration for correct alignment and technique, re assessed clam exercise in side lying   Modalities   Modalities Ultrasound   Ultrasound   Ultrasound Location right hip lateral asepc   Ultrasound Parameters 1MHz 50% pulsed @ 1.4 w/cm2 x 10 min. near greater trochanter region and medial border of sacrum   Ultrasound Goals Pain      patient response to treatment: decreased tenderness to mild/none to palpation following US treatment, continued with pain on weight bearing right  LE although decreased to soreness and not pain at end of session, Patient performed all exercises with guidance and intermittent pain in right hip lateral aspect near trochanter          PT Education - 04/12/15 0934    Education provided Yes   Education Details HEP added bridging with ball between knees, standing abduction and hip extension     Person(s) Educated Patient   Methods Explanation;Demonstration;Verbal cues;Handout   Comprehension Verbalized understanding;Returned demonstration;Verbal cues required             PT Long Term Goals - 04/10/15 1220    PT LONG TERM GOAL #1   Title Patient will be independent with home program without cuing for self management exercises and  pain control, posture awareness by 05/22/2015   Baseline Patient has limited knowledge of appropriate pain control strategies and progressin of exercises to allow return to full function without difficulty/pain   Status New   PT LONG TERM GOAL #2   Title Patient will demonstrate improved self perceived disability on Modified Oswestry to 40% or less indicatiing improved function with daily activities and sleeping by 05/04/2015   Baseline Modified Oswestry score = 52% (severe self perceived disablity)   Status New   PT LONG TERM GOAL #3   Title Patient will demonstrate improved self perceived disability on Modified Oswestry to 30% or less indicatiing improved function with daily activities and sleeping by 05/22/2015   Baseline Modified Oswestry score = 52%   Status New   PT LONG TERM GOAL #4   Title Patient will iimproved self perceived disability with LE's by improved function with daily activities with with less difficulty as demonstrated by LEFS score of 40/64 by 05/22/2015   Baseline LEFS = 12/64 (severe self perceived disability)   Status New               Plan - 04/12/15 0933    Clinical Impression Statement Patient demonstrated decreased tenderness and pain in right hip following session today. She continues with limitations with walking and sitting dur to right and left hip/buttock pain and will continue to benefit from physical therapy intervention to address pain and weakness in order to improve ablity to walk and sit with less difficulty    Pt will benefit from skilled therapeutic intervention in order to improve on the following deficits Pain;Impaired flexibility;Difficulty walking;Increased muscle spasms;Decreased strength   Rehab Potential Fair   PT Frequency 2x / week   PT Duration 6 weeks   PT Treatment/Interventions Manual techniques;Electrical Stimulation;Moist Heat;Patient/family education;Cryotherapy;Therapeutic exercise;Dry needling;Ultrasound   PT Next Visit Plan Pain  control, progressive exercise as tolerated   PT Home Exercise Plan added bridging, standing hip abduction and extension        Problem List Patient Active Problem List   Diagnosis Date Noted  . Fatty infiltration of liver 03/15/2015  . Aortic valve stenosis, nonrheumatic 01/18/2015  . Sciatica of right side 01/09/2015  . Type 2 diabetes mellitus (HCC) 11/10/2014  . RLS (restless legs syndrome) 08/23/2014  . Neuritis or radiculitis due to rupture of lumbar intervertebral disc 06/13/2014  . Degeneration of intervertebral disc of lumbar region 06/13/2014  . Arthritis 01/23/2014  . Arthritis of knee, degenerative 11/15/2013  . Depression 09/29/2013  . Insomnia 09/29/2013  . Apnea, sleep 08/29/2013  . Calculus of kidney 08/29/2013  . Abnormal presence of protein in urine 08/29/2013  . Headache, migraine 08/29/2013  . BP (high blood pressure) 08/29/2013  . HLD (hyperlipidemia) 08/29/2013  .  Allergic rhinitis 08/29/2013  . Intractable migraine without aura 02/18/2013    Beacher May PT 04/12/2015, 4:41 PM  [facility] PHYSICAL AND SPORTS MEDICINE [address] [place], [address] Phone: [phone]   Fax:  [phone]  Name: Jessica Cole MRN: [id number] Date of Birth: 10/23/1950

## 2015-04-16 ENCOUNTER — Ambulatory Visit: Payer: Medicare Other | Admitting: Physical Therapy

## 2015-04-17 ENCOUNTER — Ambulatory Visit: Payer: Medicare Other | Admitting: Physical Therapy

## 2015-04-18 ENCOUNTER — Encounter: Payer: Medicare Other | Admitting: Physical Therapy

## 2015-04-18 ENCOUNTER — Other Ambulatory Visit: Payer: Self-pay

## 2015-04-18 MED ORDER — METOPROLOL SUCCINATE ER 25 MG PO TB24: 25.0000 mg | ORAL_TABLET | Freq: Every day | ORAL | Status: DC

## 2015-04-19 ENCOUNTER — Encounter: Payer: Self-pay | Admitting: Physical Therapy

## 2015-04-19 ENCOUNTER — Ambulatory Visit: Payer: Medicare Other | Admitting: Physical Therapy

## 2015-04-19 DIAGNOSIS — M25551 Pain in right hip: Secondary | ICD-10-CM | POA: Diagnosis not present

## 2015-04-19 DIAGNOSIS — M62838 Other muscle spasm: Secondary | ICD-10-CM

## 2015-04-19 DIAGNOSIS — M25552 Pain in left hip: Principal | ICD-10-CM

## 2015-04-19 NOTE — Therapy (Signed)
[facility] PHYS[facility] AND SPORTS MEDICINE [address] [place], [place], [address] Phone: [phone]   Fax:  [phone]  Physical Therapy Treatment  Patient Details  Name: Phillis HaggisGail Thacker Wirtanen MRN: [id number] Date of Birth: 01/09/1951 Referring Provider: Tia AlertJones, David S MD  Encounter Date: 04/19/2015      PT End of Session - 04/19/15 1016    Visit Number 3   Number of Visits 12   Date for PT Re-Evaluation 05/22/15   Authorization Type 3   Authorization Time Period Gcode 10   PT Start Time [phone]   PT Stop Time 1014   PT Time Calculation (min) 40 min   Activity Tolerance Patient tolerated treatment well   Behavior During Therapy [facility] for tasks assessed/performed      Past Medical History  Diagnosis Date  . Headache(784.0)   . Obesity   . OSA on CPAP   . Environmental allergies   . Renal calculi   . Plantar fasciitis     right  . Glaucoma   . Renal calculi   . Colon polyps   . Degenerative arthritis   . RLS (restless legs syndrome) 08/23/2014    Past Surgical History  Procedure Laterality Date  . Abdominal hysterectomy    . Tonsillectomy    . Appendectomy    . Breast biopsy    . Arthroscopic surgery, knee Left     There were no vitals filed for this visit.  Visit Diagnosis:  Bilateral hip pain  Spasm of muscle      Subjective Assessment - 04/19/15 0935    Subjective Patient reports she has not been exercising as she should due to lots going on with the holiday coming and she has had migraines for the past few days.  She reports she is having pain in right hip/lower back and radiation of pain into left side.   Limitations Sitting;Standing;Walking;House hold activities;Other (comment)   Patient Stated Goals patient would like to decreased leg pain  and be able to perform normal activity without leg symptoms bothering her, especially at night   Currently in Pain? Yes   Pain Score 7    Pain Location Other (Comment)  right  side buttock and into right LE posterior thigh and some into anterior thigh   Pain Orientation Right   Pain Descriptors / Indicators Aching   Pain Type Chronic pain   Pain Onset More than a month ago   Aggravating Factors  prolonged sitting, standing and walking    Pain Relieving Factors medication and lying down to rest (supine)   Multiple Pain Sites No     Objective;  Sitting: unable to put full weight on right hip/buttock due to pain Palpation; + point tender over lateral aspect right hip and buttock with moderate pressure (improved from previous session)       [facility] Adult PT Treatment/Exercise - 04/19/15 0940    Exercises   Exercises Other Exercises   Other Exercises  Patient performed exercises with instruction, guidance, verbal and tactile suing of PT; demonstration of exercises: supine bridging bilateral and single leg, side lying hip and knee flexion/extension, clam and then performed with assistance of PT with therapist un weighting right LE for side lying hip flexion and extension x 10 reps through partial range as tolerated by patient   Modalities   Modalities Ultrasound with electrical stimulation combination:   Ultrasound   Ultrasound Goals With patient in left side lying with pillow between knees: PT performed  US/E stim. Combo. With1MHz pulsed [place] 50%, 1.4w/cm2 to lateral aspect of right hip and into gluteal region/piriformis x 10 min.   Pain     Patient response to treatment: decreased tenderness to point areas in right piriformis/gluteal region with [place], demonstrated good understanding of home exercises and performed with assistance of therapist due to intensity of pain if LE was not un weighted           PT Education - 04/19/15 1000    Education provided Yes   Education Details reassessed home program and exercises to continue   Person(s) Educated Patient   Methods Explanation;Verbal cues   Comprehension Verbalized understanding             PT Long  Term Goals - 04/10/15 1220    PT LONG TERM GOAL #1   Title Patient will be independent with home program without cuing for self management exercises and pain control, posture awareness by 05/22/2015   Baseline Patient has limited knowledge of appropriate pain control strategies and progressin of exercises to allow return to full function without difficulty/pain   Status New   PT LONG TERM GOAL #2   Title Patient will demonstrate improved self perceived disability on Modified Oswestry to 40% or less indicatiing improved function with daily activities and sleeping by 05/04/2015   Baseline Modified Oswestry score = 52% (severe self perceived disablity)   Status New   PT LONG TERM GOAL #3   Title Patient will demonstrate improved self perceived disability on Modified Oswestry to 30% or less indicatiing improved function with daily activities and sleeping by 05/22/2015   Baseline Modified Oswestry score = 52%   Status New   PT LONG TERM GOAL #4   Title Patient will iimproved self perceived disability with LE's by improved function with daily activities with with less difficulty as demonstrated by LEFS score of 40/64 by 05/22/2015   Baseline LEFS = 12/64 (severe self perceived disability)   Status New               Plan - 04/19/15 1014    Clinical Impression Statement Patient demosntrates decreasing tenderness in right hip with treatment and continues with pain limiting her ablity to sit and walk without difficulty. She will continue to require physical therapy intervention for pain control and progressive exercises to improve function with decreased pain in right hip.    Pt will benefit from skilled therapeutic intervention in order to improve on the following deficits Pain;Impaired flexibility;Difficulty walking;Increased muscle spasms;Decreased strength   Rehab Potential Fair   PT Frequency 2x / week   PT Duration 6 weeks   PT Treatment/Interventions Manual techniques;Electrical  Stimulation;Moist Heat;Patient/family education;Cryotherapy;Therapeutic exercise;Dry needling;Ultrasound   PT Next Visit Plan Pain control, progressive exercise as tolerated        Problem List Patient Active Problem List   Diagnosis Date Noted  . Fatty infiltration of liver 03/15/2015  . Aortic valve stenosis, nonrheumatic 01/18/2015  . Sciatica of right side 01/09/2015  . Type 2 diabetes mellitus (HCC) 11/10/2014  . RLS (restless legs syndrome) 08/23/2014  . Neuritis or radiculitis due to rupture of lumbar intervertebral disc 06/13/2014  . Degeneration of intervertebral disc of lumbar region 06/13/2014  . Arthritis 01/23/2014  . Arthritis of knee, degenerative 11/15/2013  . Depression 09/29/2013  . Insomnia 09/29/2013  . Apnea, sleep 08/29/2013  . Calculus of kidney 08/29/2013  . Abnormal presence of protein in urine 08/29/2013  . Headache, migraine 08/29/2013  . BP (high blood  pressure) 08/29/2013  . HLD (hyperlipidemia) 08/29/2013  . Allergic rhinitis 08/29/2013  . Intractable migraine without aura 02/18/2013    Beacher May PT 04/20/2015, 1:15 PM  [place] [facility] PHYSICAL AND SPORTS MEDICINE [address] [place], [address] Phone: [phone]   Fax:  [phone]  Name: Ketina Mars MRN: [id number] Date of Birth: November 05, 1950

## 2015-04-23 ENCOUNTER — Ambulatory Visit: Payer: Medicare Other | Admitting: Physical Therapy

## 2015-04-23 ENCOUNTER — Encounter: Payer: Self-pay | Admitting: Physical Therapy

## 2015-04-23 DIAGNOSIS — M25551 Pain in right hip: Secondary | ICD-10-CM | POA: Diagnosis not present

## 2015-04-23 DIAGNOSIS — M25552 Pain in left hip: Principal | ICD-10-CM

## 2015-04-23 DIAGNOSIS — M62838 Other muscle spasm: Secondary | ICD-10-CM

## 2015-04-24 ENCOUNTER — Encounter: Payer: Medicare Other | Admitting: Physical Therapy

## 2015-04-24 ENCOUNTER — Telehealth: Payer: Self-pay | Admitting: Neurology

## 2015-04-24 MED ORDER — BUTORPHANOL TARTRATE 10 MG/ML NA SOLN: 1.0000 | NASAL | Status: DC | PRN

## 2015-04-24 NOTE — Telephone Encounter (Signed)
I called the patient. She is not having pain at the moment. She states she has used the Butorphanol nasal spray in the past and it helps with her migraines. [place] [facility] [place] [facility] like she does the [place]. I advised that I would ask Dr. Anne HahnWillis and we would call her back.

## 2015-04-24 NOTE — Therapy (Signed)
[place]ICINE [address], [place], [address] Phone: [phone]   Fax:  [phone]  Physical Therapy Treatment  Patient Details  Name: Jessica Cole MRN: [id number] Date of Birth: 12/25/1950 Referring Provider: Tia Alert MD  Encounter Date: 04/23/2015      PT End of Session - 04/23/15 1534    Visit Number 4   Number of Visits 12   Date for PT Re-Evaluation 05/22/15   Authorization Type 4   Authorization Time Period Gcode 10   PT Start Time 1445   PT Stop Time 1530   PT Time Calculation (min) 45 min   Activity Tolerance Patient limited by pain   Behavior During Therapy [facility] for tasks assessed/performed      Past Medical History  Diagnosis Date  . Headache(784.0)   . Obesity   . OSA on CPAP   . Environmental allergies   . Renal calculi   . Plantar fasciitis     right  . Glaucoma   . Renal calculi   . Colon polyps   . Degenerative arthritis   . RLS (restless legs syndrome) 08/23/2014    Past Surgical History  Procedure Laterality Date  . Abdominal hysterectomy    . Tonsillectomy    . Appendectomy    . Breast biopsy    . Arthroscopic surgery, knee Left     There were no vitals filed for this visit.  Visit Diagnosis:  Bilateral hip pain  Spasm of muscle      Subjective Assessment - 04/23/15 1452    Subjective Patient reports she has been out and running errands and has been doing her exercises as instructed. She has increased pain today for no apparent reason.    Limitations Sitting;Standing;Walking;House hold activities;Other (comment)   Patient Stated Goals patient would like to decreased leg pain  and be able to perform normal activity without leg symptoms bothering her, especially at night   Currently in Pain? Yes   Pain Score 8    Pain Location --  right sided buttock pain into right knee anterior and posterior thigh   Pain Orientation Right   Pain Descriptors /  Indicators Aching   Pain Type Chronic pain   Pain Onset More than a month ago   Pain Frequency Intermittent       Objective: AROM re assessed lumbar spine: forward flexion <30 degrees with bilateral lower back/buttock pain, repeated no change in symptoms 7/10 pain level, lumbar extension limited 75% with pain reported across lower back right side>left, side bend to left hand to knee with mild to no pain on right, side bend to right with increased pain on right and ROM with hand to knee Palpation; very tender to all palpation (less than initial evaluation) right hip, gluteal and right side lower back/lumbar paraspinals        [facility] Adult PT Treatment/Exercise - 04/23/15 1454    Exercises   Exercises Other Exercises   Other Exercises  Reassessed home exercises for clam and standing hip abduction/extension as tolerated. Discussed progression of exercises and the importance of consistent exercises within pain free or mild pain ranges in order not to exacerbate symptoms.    Modalities   Modalities Electrical stimulation   Programme researcher, broadcasting/film/video Location right hip/LE    Electrical Stimulation Parameters high volt estim. for muscle spasms continuous mode: PT applied (4) electrodes to right hip and gluteal muscles/piriformis region and along lower  lumbar spine right side with patient in side lying left with pillow between knees x 15 min.   Electrical Stimulation Goals Pain  reduction of spasms                     Patient response to treatment: not able to tolerate side lying >15 min. Due to increased pain into right LE even with repositioning of LE and hip, patient verbalized understanding of exercises to be performed at home and to perform within ROM of least pain and reported some relief with electrical stimulation today          PT Education - 04/23/15 1523    Education provided Yes   Education Details HEP reassessed with verbal cues and verbal  understanding   Person(s) Educated Patient   Methods Explanation;Verbal cues   Comprehension Verbalized understanding             PT Long Term Goals - 04/10/15 1220    PT LONG TERM GOAL #1   Title Patient will be independent with home program without cuing for self management exercises and pain control, posture awareness by 05/22/2015   Baseline Patient has limited knowledge of appropriate pain control strategies and progressin of exercises to allow return to full function without difficulty/pain   Status New   PT LONG TERM GOAL #2   Title Patient will demonstrate improved self perceived disability on Modified Oswestry to 40% or less indicatiing improved function with daily activities and sleeping by 05/04/2015   Baseline Modified Oswestry score = 52% (severe self perceived disablity)   Status New   PT LONG TERM GOAL #3   Title Patient will demonstrate improved self perceived disability on Modified Oswestry to 30% or less indicatiing improved function with daily activities and sleeping by 05/22/2015   Baseline Modified Oswestry score = 52%   Status New   PT LONG TERM GOAL #4   Title Patient will iimproved self perceived disability with LE's by improved function with daily activities with with less difficulty as demonstrated by LEFS score of 40/64 by 05/22/2015   Baseline LEFS = 12/64 (severe self perceived disability)   Status New               Plan - 04/23/15 1535    Clinical Impression Statement Patient demonstrates decreased pain in right hip overall with treatment. She continues with pain as primary limiting factor to improving function wtih standing and walking activities. She will continue to require physical therapy intervention for pain control and progressive exercises to imrpove function with decreased pain in right hip.   Pt will benefit from skilled therapeutic intervention in order to improve on the following deficits Pain;Impaired flexibility;Difficulty  walking;Increased muscle spasms;Decreased strength   Rehab Potential Fair   PT Frequency 2x / week   PT Duration 6 weeks   PT Treatment/Interventions Manual techniques;Electrical Stimulation;Moist Heat;Patient/family education;Cryotherapy;Therapeutic exercise;Dry needling;Ultrasound   PT Next Visit Plan Pain control, progressive exercise as tolerated        Problem List Patient Active Problem List   Diagnosis Date Noted  . Fatty infiltration of liver 03/15/2015  . Aortic valve stenosis, nonrheumatic 01/18/2015  . Sciatica of right side 01/09/2015  . Type 2 diabetes mellitus (HCC) 11/10/2014  . RLS (restless legs syndrome) 08/23/2014  . Neuritis or radiculitis due to rupture of lumbar intervertebral disc 06/13/2014  . Degeneration of intervertebral disc of lumbar region 06/13/2014  . Arthritis 01/23/2014  . Arthritis of knee, degenerative 11/15/2013  .  Depression 09/29/2013  . Insomnia 09/29/2013  . Apnea, sleep 08/29/2013  . Calculus of kidney 08/29/2013  . Abnormal presence of protein in urine 08/29/2013  . Headache, migraine 08/29/2013  . BP (high blood pressure) 08/29/2013  . HLD (hyperlipidemia) 08/29/2013  . Allergic rhinitis 08/29/2013  . Intractable migraine without aura 02/18/2013    Beacher MayBrooks, Marie PT 04/24/2015, 2:28 PM  [place] [facility] PH[facility]ICINE [address] [place], [place], [address] Phone: [phone]   Fax:  [phone]  Name: Jessica Cole MRN: [id number] Date of Birth: Mar 04, 1951

## 2015-04-24 NOTE — Telephone Encounter (Signed)
Patient is calling as she needs pain medication for a bulging disk and migraine headaches.  She states she is currently taking Oxycodone 10 mg but wonders if Dr. Anne HahnWillis would prescribe Butorphanol nasal spray.  Please call.

## 2015-04-24 NOTE — Telephone Encounter (Signed)
I called the patient. She wishes to switch from the oxycodone to Stadol nasal spray. I am okay with this, I will discontinue the oxycodone.

## 2015-04-25 ENCOUNTER — Encounter: Payer: Self-pay | Admitting: Physical Therapy

## 2015-04-25 ENCOUNTER — Ambulatory Visit: Payer: Medicare Other | Admitting: Physical Therapy

## 2015-04-25 DIAGNOSIS — M62838 Other muscle spasm: Secondary | ICD-10-CM

## 2015-04-25 DIAGNOSIS — M25551 Pain in right hip: Secondary | ICD-10-CM

## 2015-04-25 DIAGNOSIS — M25552 Pain in left hip: Principal | ICD-10-CM

## 2015-04-26 NOTE — Therapy (Signed)
[facility] PHYS[facility]ORTS MEDICINE [address], [place], [address] Phone: [phone]   Fax:  [phone]  Physical Therapy Treatment  Patient Details  Name: Jessica Cole MRN: [id number] Date of Birth: 28-Jul-1950 Referring Provider: Tia Alert MD  Encounter Date: 04/25/2015      PT End of Session - 04/25/15 1134    Visit Number 5   Number of Visits 12   Date for PT Re-Evaluation 05/22/15   Authorization Type 5   Authorization Time Period Gcode 10   PT Start Time 1040   PT Stop Time 1131   PT Time Calculation (min) 51 min   Activity Tolerance Patient limited by pain;Patient tolerated treatment well   Behavior During Therapy [facility] for tasks assessed/performed      Past Medical History  Diagnosis Date  . Headache(784.0)   . Obesity   . OSA on CPAP   . Environmental allergies   . Renal calculi   . Plantar fasciitis     right  . Glaucoma   . Renal calculi   . Colon polyps   . Degenerative arthritis   . RLS (restless legs syndrome) 08/23/2014    Past Surgical History  Procedure Laterality Date  . Abdominal hysterectomy    . Tonsillectomy    . Appendectomy    . Breast biopsy    . Arthroscopic surgery, knee Left     There were no vitals filed for this visit.  Visit Diagnosis:  Bilateral hip pain  Spasm of muscle      Subjective Assessment - 04/25/15 1041    Subjective Patient reports she feels better than she did the other day. Today she is having lower back and right buttock pain and LE sympotms (slight sensation, not normal).   Limitations Sitting;Standing;Walking;House hold activities;Other (comment)   Patient Stated Goals patient would like to decreased leg pain  and be able to perform normal activity without leg symptoms bothering her, especially at night   Currently in Pain? Yes   Pain Score 3    Pain Location --  right lower back into buttock   Pain Orientation Right   Pain Descriptors /  Indicators Aching   Pain Type Chronic pain   Pain Onset More than a month ago   Pain Frequency Intermittent       Objective: Palpation: lumbar spine mobility decreased PA mobility with increased tenderness throughout         [facility] Adult PT Treatment/Exercise - 04/25/15 1049    Exercises   Exercises Other Exercises   Other Exercises  patient performed exercises with guidance, verbal and tactile cues and demonstration of PT: Standing walk along balance beam x 1 min.,  (increased pain to 4/10), walk against resistive band x 5 reps forward and backwards, diagonal wedding march x 2 reps 20 feet, sitting hip adduction with ball and glute sets x 15 reps, hip abduction with resistive band x 15 reps      Manual therapy Joint mobilization thoracic and lumbar spine with patient seated in massage chair, gentle PA mobilization PA glides grade 1-2 x 3-5 reps,    Electrical Stimulation   Electrical Stimulation Location Upper and lower back paraspinal muscles     Electrical Stimulation Goals High volt estim. Applied by PT (4) electrodes to either side of spine thoracic and lumbar paraspinal muslces with patient in massage chair x 15 min. Pain  reduction of spasms     Patient response to treatment:  continued with tenderness along spine with mobilization, demonstrated improved technique with exercises with verbal cuing and guidance, pain limited exercises, minimal decrease in tenderness and pain following estim. and exercise. Reported mild soreness at end of session           PT Education - 04/25/15 1100    Education provided Yes   Education Details HEP: diagonal wedding march, clam, hip adductin and abduction in sitting, resistive band walking   Person(s) Educated Patient   Methods Explanation;Demonstration;Verbal cues;Handout   Comprehension Verbalized understanding;Returned demonstration;Verbal cues required             PT Long Term Goals - 04/10/15 1220    PT LONG TERM GOAL #1    Title Patient will be independent with home program without cuing for self management exercises and pain control, posture awareness by 05/22/2015   Baseline Patient has limited knowledge of appropriate pain control strategies and progressin of exercises to allow return to full function without difficulty/pain   Status New   PT LONG TERM GOAL #2   Title Patient will demonstrate improved self perceived disability on Modified Oswestry to 40% or less indicatiing improved function with daily activities and sleeping by 05/04/2015   Baseline Modified Oswestry score = 52% (severe self perceived disablity)   Status New   PT LONG TERM GOAL #3   Title Patient will demonstrate improved self perceived disability on Modified Oswestry to 30% or less indicatiing improved function with daily activities and sleeping by 05/22/2015   Baseline Modified Oswestry score = 52%   Status New   PT LONG TERM GOAL #4   Title Patient will iimproved self perceived disability with LE's by improved function with daily activities with with less difficulty as demonstrated by LEFS score of 40/64 by 05/22/2015   Baseline LEFS = 12/64 (severe self perceived disability)   Status New               Plan - 04/25/15 1130    Clinical Impression Statement Patient required guidance and cuing to perform exercises with good technique and demonstrated increased soreness in right hip with exercises. Pain continues to be primary limiting factor to being able to progress with exercises.    Pt will benefit from skilled therapeutic intervention in order to improve on the following deficits Pain;Impaired flexibility;Difficulty walking;Increased muscle spasms;Decreased strength   Rehab Potential Fair   PT Frequency 2x / week   PT Duration 6 weeks   PT Treatment/Interventions Manual techniques;Electrical Stimulation;Moist Heat;Patient/family education;Cryotherapy;Therapeutic exercise;Dry needling;Ultrasound   PT Next Visit Plan Pain control,  progressive exercise as tolerated   PT Home Exercise Plan continue with home exercises: include diagonal wedding march and resistive band walking        Problem List Patient Active Problem List   Diagnosis Date Noted  . Fatty infiltration of liver 03/15/2015  . Aortic valve stenosis, nonrheumatic 01/18/2015  . Sciatica of right side 01/09/2015  . Type 2 diabetes mellitus (HCC) 11/10/2014  . RLS (restless legs syndrome) 08/23/2014  . Neuritis or radiculitis due to rupture of lumbar intervertebral disc 06/13/2014  . Degeneration of intervertebral disc of lumbar region 06/13/2014  . Arthritis 01/23/2014  . Arthritis of knee, degenerative 11/15/2013  . Depression 09/29/2013  . Insomnia 09/29/2013  . Apnea, sleep 08/29/2013  . Calculus of kidney 08/29/2013  . Abnormal presence of protein in urine 08/29/2013  . Headache, migraine 08/29/2013  . BP (high blood pressure) 08/29/2013  . HLD (hyperlipidemia) 08/29/2013  . Allergic rhinitis 08/29/2013  .  Intractable migraine without aura 02/18/2013    Beacher MayBrooks, Antero Derosia PT 04/26/2015, 9:20 AM  [facility]TS MEDICINE [address] [place], [place], [address] Phone: [phone]   Fax:  [phone]  Name: Phillis HaggisGail Thacker Tolson MRN: [id number] Date of Birth: [birth year]

## 2015-05-01 ENCOUNTER — Ambulatory Visit: Payer: Medicare Other | Admitting: Physical Therapy

## 2015-05-01 ENCOUNTER — Encounter: Payer: Self-pay | Admitting: Physical Therapy

## 2015-05-01 DIAGNOSIS — M25551 Pain in right hip: Secondary | ICD-10-CM

## 2015-05-01 DIAGNOSIS — M62838 Other muscle spasm: Secondary | ICD-10-CM

## 2015-05-01 DIAGNOSIS — R531 Weakness: Secondary | ICD-10-CM

## 2015-05-01 DIAGNOSIS — M25552 Pain in left hip: Principal | ICD-10-CM

## 2015-05-01 NOTE — Therapy (Signed)
[facility] [facility] PH[facility] MEDICINE [address] , [place], [address] Phone: [phone]   Fax:  [phone]  Physical Therapy Treatment  Patient Details  Name: Jessica Cole MRN: [id number] Date of Birth: 1951-03-03 Referring Provider: Tia AlertJones, David S MD  Encounter Date: 05/01/2015      PT End of Session - 05/01/15 1050    Visit Number 6   Number of Visits 12   Date for PT Re-Evaluation 05/22/15   Authorization Type 6   Authorization Time Period Gcode 10   PT Start Time 0903   PT Stop Time 0950   PT Time Calculation (min) 47 min   Activity Tolerance Patient tolerated treatment well;Patient limited by pain   Behavior During Therapy [facility] for tasks assessed/performed      Past Medical History  Diagnosis Date  . Headache(784.0)   . Obesity   . OSA on CPAP   . Environmental allergies   . Renal calculi   . Plantar fasciitis     right  . Glaucoma   . Renal calculi   . Colon polyps   . Degenerative arthritis   . RLS (restless legs syndrome) 08/23/2014    Past Surgical History  Procedure Laterality Date  . Abdominal hysterectomy    . Tonsillectomy    . Appendectomy    . Breast biopsy    . Arthroscopic surgery, knee Left     There were no vitals filed for this visit.  Visit Diagnosis:  Bilateral hip pain  Spasm of muscle  Weakness generalized      Subjective Assessment - 05/01/15 0905    Subjective Patient reports she has had good and bad days in right and left side of her lower back and upper gluteal regions. She has not had time to exercise with being busy over the holiday weekend. she did rest most of the day yesterday..   Limitations Sitting;Standing;Walking;House hold activities;Other (comment)   Patient Stated Goals patient would like to decreased leg pain  and be able to perform normal activity without leg symptoms bothering her, especially at night   Currently in Pain? Yes   Pain Score 3    Pain  Location Other (Comment)  right and lower back/buttocks region   Pain Orientation Right;Left   Pain Descriptors / Indicators Aching   Pain Type Chronic pain   Pain Onset More than a month ago   Pain Frequency Intermittent       Observation: Gait; slow, guarded posture on arrival to clinic with decreased trunk rotation Palpation; hypersensitive along thoracic to lower lumbar region over spinal processes and bilateral paraspinal muscles        [facility] Adult PT Treatment/Exercise - 05/01/15 0909    Exercises   Exercises Other Exercises   Other Exercises  patient performed exercises with guidance, verbal and tactile cues and demonstration of PT: Sitting stabilization: ball between knees for hip adduction with glute squeeze x 15 reps, hip abduction/ER with green resistive band x 15 reps, side step along foam balance beam x 5 reps, walk against resistive band with assistance x 5 reps forward and backwards with verbal cues, wedding march on diagonal with guided verbal cuing x 1 min.    Manual therapy   Joint mobilization Joint mobilization performed by PT:  thoracic and lumbar spine with patient seated in massage chair, gentle PA mobilization PA glides grade 1-2 x 3-5 reps    Electrical Stimulation   Electrical Stimulation Location  High volt  estim. Applied by PT (4) electrodes to either side of spine thoracic and lumbar paraspinal muslces with patient in massage chair x 15 min.   Electrical Stimulation Goals Pain  reduction of spasms        Patient response to treatment: continued with tenderness along spine with mobilization, demonstrated improved technique with exercises with verbal cuing and guidance, pain limited exercises, minimal decrease in tenderness and pain following estim. and exercise. Reported mild soreness and stiffness in both sides of hips at end of session         PT Education - 05/01/15 0932    Education provided Yes   Education Details reassessed home program with  verbal cuing/demonstration   Person(s) Educated Patient   Methods Explanation;Demonstration;Verbal cues   Comprehension Verbalized understanding;Returned demonstration;Verbal cues required             PT Long Term Goals - 04/10/15 1220    PT LONG TERM GOAL #1   Title Patient will be independent with home program without cuing for self management exercises and pain control, posture awareness by 05/22/2015   Baseline Patient has limited knowledge of appropriate pain control strategies and progressin of exercises to allow return to full function without difficulty/pain   Status New   PT LONG TERM GOAL #2   Title Patient will demonstrate improved self perceived disability on Modified Oswestry to 40% or less indicatiing improved function with daily activities and sleeping by 05/04/2015   Baseline Modified Oswestry score = 52% (severe self perceived disablity)   Status New   PT LONG TERM GOAL #3   Title Patient will demonstrate improved self perceived disability on Modified Oswestry to 30% or less indicatiing improved function with daily activities and sleeping by 05/22/2015   Baseline Modified Oswestry score = 52%   Status New   PT LONG TERM GOAL #4   Title Patient will iimproved self perceived disability with LE's by improved function with daily activities with with less difficulty as demonstrated by LEFS score of 40/64 by 05/22/2015   Baseline LEFS = 12/64 (severe self perceived disability)   Status New               Plan - 05/01/15 1050    Clinical Impression Statement Patient required guidance and verbal cues to perform exercises with appropriate intensity, ROM, technique for core stability/control during all exercises. She was limited by bilateral hip pain for all exercises. She demonstrated improved technqiue and understanding of home program following treatment session.    Pt will benefit from skilled therapeutic intervention in order to improve on the following deficits  Pain;Impaired flexibility;Difficulty walking;Increased muscle spasms;Decreased strength   Rehab Potential Fair   PT Frequency 2x / week   PT Duration 6 weeks   PT Treatment/Interventions Manual techniques;Electrical Stimulation;Moist Heat;Patient/family education;Cryotherapy;Therapeutic exercise;Dry needling;Ultrasound   PT Next Visit Plan Pain control, progressive exercise as tolerated        Problem List Patient Active Problem List   Diagnosis Date Noted  . Fatty infiltration of liver 03/15/2015  . Aortic valve stenosis, nonrheumatic 01/18/2015  . Sciatica of right side 01/09/2015  . Type 2 diabetes mellitus (HCC) 11/10/2014  . RLS (restless legs syndrome) 08/23/2014  . Neuritis or radiculitis due to rupture of lumbar intervertebral disc 06/13/2014  . Degeneration of intervertebral disc of lumbar region 06/13/2014  . Arthritis 01/23/2014  . Arthritis of knee, degenerative 11/15/2013  . Depression 09/29/2013  . Insomnia 09/29/2013  . Apnea, sleep 08/29/2013  . Calculus of kidney  08/29/2013  . Abnormal presence of protein in urine 08/29/2013  . Headache, migraine 08/29/2013  . BP (high blood pressure) 08/29/2013  . HLD (hyperlipidemia) 08/29/2013  . Allergic rhinitis 08/29/2013  . Intractable migraine without aura 02/18/2013    Beacher May PT 05/01/2015, 3:12 PM  [place] [facility] PHYSICAL AND SPORTS MEDICINE [address] [place], [address] Phone: [phone]   Fax:  [phone]  Name: Jessica Cole MRN: [id number] Date of Birth: Sep 05, 1950

## 2015-05-03 ENCOUNTER — Encounter: Payer: Self-pay | Admitting: Physical Therapy

## 2015-05-03 ENCOUNTER — Ambulatory Visit: Payer: Medicare Other | Admitting: Physical Therapy

## 2015-05-03 DIAGNOSIS — M25551 Pain in right hip: Secondary | ICD-10-CM

## 2015-05-03 DIAGNOSIS — M62838 Other muscle spasm: Secondary | ICD-10-CM

## 2015-05-03 DIAGNOSIS — R531 Weakness: Secondary | ICD-10-CM

## 2015-05-03 DIAGNOSIS — M25552 Pain in left hip: Principal | ICD-10-CM

## 2015-05-03 NOTE — Therapy (Signed)
[place] [facility] PH[facility]IC[facility] [address] [place], [place], [address] Phone: [phone]   Fax:  [phone]  Physical Therapy Treatment  Patient Details  Name: Jessica Cole MRN: [id number] Date of Birth: April 09, 1951 Referring Provider: Tia AlertJones, David S MD  Encounter Date: 05/03/2015      PT End of Session - 05/03/15 0945    Visit Number 7   Number of Visits 12   Date for PT Re-Evaluation 05/22/15   Authorization Type 7   Authorization Time Period Gcode 10   PT Start Time [phone]   PT Stop Time 0940   PT Time Calculation (min) 46 min   Activity Tolerance Patient tolerated treatment well;Patient limited by pain   Behavior During Therapy [facility] for tasks assessed/performed      Past Medical History  Diagnosis Date  . Headache(784.0)   . Obesity   . OSA on CPAP   . Environmental allergies   . Renal calculi   . Plantar fasciitis     right  . Glaucoma   . Renal calculi   . Colon polyps   . Degenerative arthritis   . RLS (restless legs syndrome) 08/23/2014    Past Surgical History  Procedure Laterality Date  . Abdominal hysterectomy    . Tonsillectomy    . Appendectomy    . Breast biopsy    . Arthroscopic surgery, knee Left     There were no vitals filed for this visit.  Visit Diagnosis:  Bilateral hip pain  Spasm of muscle  Weakness generalized      Subjective Assessment - 05/03/15 0856    Subjective Patient reports she is having more pain on right lower back with radiating symptoms into her right LE this morning. She reports she has a fitbit that monitors her sleep and last evening she was more restless during the night (18x). She reports temporary relief of pain with current treatment and is not exercising as consistently as she should but is walking quite a bit.    Limitations Sitting;Standing;Walking;House hold activities;Other (comment)   Patient Stated Goals patient would like to decrease leg pain  and  be able to perform normal activity without leg symptoms bothering her, especially at night   Currently in Pain? Yes   Pain Onset More than a month ago   Pain Frequency Intermittent       Objective; Gait: guarded, slow cadence, short step length Palpation:  Hypersensitive, spasms palpable along both sides of thoracic and lumbar spine       [facility] Adult PT Treatment/Exercise - 05/03/15 1452    Exercises   Exercises Other Exercises   Other Exercises   Re assessed home program to be done 2x/week and walk daily. She requests no exercise today due to having a lot of things to do at home this afternoon with taking decorations down and putting them away.       Manual therapy Soft tissue mobilization to back musculature from cervical spine to lumbar spine with concentration on superficial techniques to improve elasticity and decreased hyper sensitivity and reduce spasms with patient seated in massage chair   Electrical Stimulation   Electrical Stimulation Location With patient seated in massage chair PT applied (4) electrodes to patient lower thoracic and lumbar spine paraspinal muscles over areas of pain, spasms    Electrical Stimulation Goals Pain  reduction of spasms       Patient response to treatment: improved soft tissue mobility by 25% and decreased  tenderness which allowed increased ability to perform STM as compared to previous session, able to tolerate increased time for estim and STM, patient verbalized understanding of the need to move and exercise to see improvement in pain and mobility that lasts for longer periods of time, not just temporarily         PT Education - 05/03/15 0945    Education provided Yes   Education Details educated in the need to exercise and walk daily, do not sit or be n static position for >20 min. before moving, perform HEP 2x/week    Person(s) Educated Patient   Methods Explanation;Demonstration;Verbal cues   Comprehension Verbalized understanding              PT Long Term Goals - 04/10/15 1220    PT LONG TERM GOAL #1   Title Patient will be independent with home program without cuing for self management exercises and pain control, posture awareness by 05/22/2015   Baseline Patient has limited knowledge of appropriate pain control strategies and progressin of exercises to allow return to full function without difficulty/pain   Status New   PT LONG TERM GOAL #2   Title Patient will demonstrate improved self perceived disability on Modified Oswestry to 40% or less indicatiing improved function with daily activities and sleeping by 05/04/2015   Baseline Modified Oswestry score = 52% (severe self perceived disablity)   Status New   PT LONG TERM GOAL #3   Title Patient will demonstrate improved self perceived disability on Modified Oswestry to 30% or less indicatiing improved function with daily activities and sleeping by 05/22/2015   Baseline Modified Oswestry score = 52%   Status New   PT LONG TERM GOAL #4   Title Patient will iimproved self perceived disability with LE's by improved function with daily activities with with less difficulty as demonstrated by LEFS score of 40/64 by 05/22/2015   Baseline LEFS = 12/64 (severe self perceived disability)   Status New               Plan - 05/03/15 0845    Clinical Impression Statement Patient was able to tolerate more soft tissue mobilization today and verbalized good understanding of home program and to be exercising daily with walkng and at least 2x/week for srenthening/control exercises. No exercises performed today due to increased soreness from previous session and patient having a busy day with household chores later today.    Pt will benefit from skilled therapeutic intervention in order to improve on the following deficits Pain;Impaired flexibility;Difficulty walking;Increased muscle spasms;Decreased strength   Rehab Potential Fair   PT Frequency 2x / week   PT Duration 6  weeks   PT Treatment/Interventions Manual techniques;Electrical Stimulation;Moist Heat;Patient/family education;Cryotherapy;Therapeutic exercise;Dry needling;Ultrasound   PT Next Visit Plan Pain control, progressive exercise as tolerated, manual soft tissue mobilzation   PT Home Exercise Plan continue with home exercises: include diagonal wedding march and resistive band walking every 2 days        Problem List Patient Active Problem List   Diagnosis Date Noted  . Fatty infiltration of liver 03/15/2015  . Aortic valve stenosis, nonrheumatic 01/18/2015  . Sciatica of right side 01/09/2015  . Type 2 diabetes mellitus (HCC) 11/10/2014  . RLS (restless legs syndrome) 08/23/2014  . Neuritis or radiculitis due to rupture of lumbar intervertebral disc 06/13/2014  . Degeneration of intervertebral disc of lumbar region 06/13/2014  . Arthritis 01/23/2014  . Arthritis of knee, degenerative 11/15/2013  . Depression 09/29/2013  .  Insomnia 09/29/2013  . Apnea, sleep 08/29/2013  . Calculus of kidney 08/29/2013  . Abnormal presence of protein in urine 08/29/2013  . Headache, migraine 08/29/2013  . BP (high blood pressure) 08/29/2013  . HLD (hyperlipidemia) 08/29/2013  . Allergic rhinitis 08/29/2013  . Intractable migraine without aura 02/18/2013    Beacher May PT 05/03/2015, 3:08 PM  [place] [facility] PHYSICAL AND SPORTS MEDICINE [address], [place], [address] Phone: [phone]   Fax:  [phone]  Name: Jessica Cole MRN: [id number] Date of Birth: [birth year]

## 2015-05-08 ENCOUNTER — Encounter: Payer: Self-pay | Admitting: Neurology

## 2015-05-08 ENCOUNTER — Ambulatory Visit (INDEPENDENT_AMBULATORY_CARE_PROVIDER_SITE_OTHER): Payer: Medicare HMO | Admitting: Neurology

## 2015-05-08 ENCOUNTER — Encounter: Payer: Medicare Other | Admitting: Physical Therapy

## 2015-05-08 VITALS — BP 160/90 | HR 64 | Ht 60.0 in | Wt 199.5 lb

## 2015-05-08 DIAGNOSIS — M5431 Sciatica, right side: Secondary | ICD-10-CM

## 2015-05-08 DIAGNOSIS — G43919 Migraine, unspecified, intractable, without status migrainosus: Secondary | ICD-10-CM | POA: Diagnosis not present

## 2015-05-08 DIAGNOSIS — G2581 Restless legs syndrome: Secondary | ICD-10-CM | POA: Diagnosis not present

## 2015-05-08 MED ORDER — NORTRIPTYLINE HCL 25 MG PO CAPS: 50.0000 mg | ORAL_CAPSULE | Freq: Every day | ORAL | Status: DC

## 2015-05-08 MED ORDER — ROPINIROLE HCL 3 MG PO TABS: 3.0000 mg | ORAL_TABLET | Freq: Two times a day (BID) | ORAL | Status: DC

## 2015-05-08 NOTE — Patient Instructions (Signed)
We will stop the Trazodone and use nortriptyline at night. We will go up on the requip to 3 mg twice a day.   Sciatica Sciatica is pain, weakness, numbness, or tingling along the path of the sciatic nerve. The nerve starts in the lower back and runs down the back of each leg. The nerve controls the muscles in the lower leg and in the back of the knee, while also providing sensation to the back of the thigh, lower leg, and the sole of your foot. Sciatica is a symptom of another medical condition. For instance, nerve damage or certain conditions, such as a herniated disk or bone spur on the spine, pinch or put pressure on the sciatic nerve. This causes the pain, weakness, or other sensations normally associated with sciatica. Generally, sciatica only affects one side of the body. CAUSES   Herniated or slipped disc.  Degenerative disk disease.  A pain disorder involving the narrow muscle in the buttocks (piriformis syndrome).  Pelvic injury or fracture.  Pregnancy.  Tumor (rare). SYMPTOMS  Symptoms can vary from mild to very severe. The symptoms usually travel from the low back to the buttocks and down the back of the leg. Symptoms can include:  Mild tingling or dull aches in the lower back, leg, or hip.  Numbness in the back of the calf or sole of the foot.  Burning sensations in the lower back, leg, or hip.  Sharp pains in the lower back, leg, or hip.  Leg weakness.  Severe back pain inhibiting movement. These symptoms may get worse with coughing, sneezing, laughing, or prolonged sitting or standing. Also, being overweight may worsen symptoms. DIAGNOSIS  Your caregiver will perform a physical exam to look for common symptoms of sciatica. He or she may ask you to do certain movements or activities that would trigger sciatic nerve pain. Other tests may be performed to find the cause of the sciatica. These may include:  Blood tests.  X-rays.  Imaging tests, such as an MRI or CT  scan. TREATMENT  Treatment is directed at the cause of the sciatic pain. Sometimes, treatment is not necessary and the pain and discomfort goes away on its own. If treatment is needed, your caregiver may suggest:  Over-the-counter medicines to relieve pain.  Prescription medicines, such as anti-inflammatory medicine, muscle relaxants, or narcotics.  Applying heat or ice to the painful area.  Steroid injections to lessen pain, irritation, and inflammation around the nerve.  Reducing activity during periods of pain.  Exercising and stretching to strengthen your abdomen and improve flexibility of your spine. Your caregiver may suggest losing weight if the extra weight makes the back pain worse.  Physical therapy.  Surgery to eliminate what is pressing or pinching the nerve, such as a bone spur or part of a herniated disk. HOME CARE INSTRUCTIONS   Only take over-the-counter or prescription medicines for pain or discomfort as directed by your caregiver.  Apply ice to the affected area for 20 minutes, 3-4 times a day for the first 48-72 hours. Then try heat in the same way.  Exercise, stretch, or perform your usual activities if these do not aggravate your pain.  Attend physical therapy sessions as directed by your caregiver.  Keep all follow-up appointments as directed by your caregiver.  Do not wear high heels or shoes that do not provide proper support.  Check your mattress to see if it is too soft. A firm mattress may lessen your pain and discomfort. SEEK IMMEDIATE  MEDICAL CARE IF:   You lose control of your bowel or bladder (incontinence).  You have increasing weakness in the lower back, pelvis, buttocks, or legs.  You have redness or swelling of your back.  You have a burning sensation when you urinate.  You have pain that gets worse when you lie down or awakens you at night.  Your pain is worse than you have experienced in the past.  Your pain is lasting longer than 4  weeks.  You are suddenly losing weight without reason. MAKE SURE YOU:  Understand these instructions.  Will watch your condition.  Will get help right away if you are not doing well or get worse.   This information is not intended to replace advice given to you by your health care provider. Make sure you discuss any questions you have with your health care provider.   Document Released: 04/15/2001 Document Revised: 01/10/2015 Document Reviewed: 08/31/2011 Elsevier Interactive Patient Education [employer]

## 2015-05-08 NOTE — Progress Notes (Signed)
Reason for visit: Back pain  Jessica Cole is an 65 y.o. female  History of present illness:  Ms. Fulmore is a 65 year old right-handed white female with a history of obesity, low back pain with right-sided sciatica, and migraine headache. The patient also reports difficulty with restless leg syndrome. The patient has undergone MRI evaluation of the low back showing possible right L3 nerve root compression, the patient has been seen by neurosurgery. The patient has not been considered for surgery at this time, she was sent for physical therapy. The patient is engaged in physical therapy at this time. The patient reports some variability with her headaches, in October and November, she had 3 headaches each month, but she had 11 headaches in December. The patient indicates that weather may be a factor in her headache. The patient is on gabapentin and Cymbalta currently. She takes trazodone at night 200 mg for sleep. She has restless leg syndrome and she takes Requip taking 2 mg twice daily. The patient is having some issues with restless legs at this time. The patient continues to have back pain that wakes her up at night, she has pain in the low back radiating down the leg to the ankle. No weakness has been noted. The patient reports that the pain is now starting some on the left side.  Past Medical History  Diagnosis Date  . Headache(784.0)   . Obesity   . OSA on CPAP   . Environmental allergies   . Renal calculi   . Plantar fasciitis     right  . Glaucoma   . Renal calculi   . Colon polyps   . Degenerative arthritis   . RLS (restless legs syndrome) 08/23/2014    Past Surgical History  Procedure Laterality Date  . Abdominal hysterectomy    . Tonsillectomy    . Appendectomy    . Breast biopsy    . Arthroscopic surgery, knee Left     Family History  Problem Relation Age of Onset  . Lung cancer Mother   . Congestive Heart Failure Father   . Depression Sister   . Rheum  arthritis Sister   . Headache Maternal Grandfather   . Migraines Maternal Grandfather   . Headache Maternal Uncle   . Diabetes    . Heart disease    . Hypertension      Social history:  reports that she has never smoked. She has never used smokeless tobacco. She reports that she does not drink alcohol or use illicit drugs.    Allergies  Allergen Reactions  . Dexamethasone Other (See Comments)    During a tapered dose once.    Medications:  Prior to Admission medications   Medication Sig Start Date End Date Taking? Authorizing Provider  aspirin 81 MG tablet Take 81 mg by mouth 2 (two) times daily.   Yes Historical Provider, MD  Azelastine-Fluticasone (DYMISTA) 137-50 MCG/ACT SUSP Place into the nose 2 (two) times daily.   Yes Historical Provider, MD  B Complex Vitamins (VITAMIN B COMPLEX PO) Take by mouth daily.   Yes Historical Provider, MD  busPIRone (BUSPAR) 15 MG tablet Take 15 mg by mouth 2 (two) times daily.   Yes Historical Provider, MD  butorphanol (STADOL) 10 MG/ML nasal spray Place 1 spray into the nose every 4 (four) hours as needed for headache. 04/24/15  Yes York Spaniel, MD  diclofenac sodium (VOLTAREN) 1 % GEL Apply 4 g topically 2 (two) times daily. 08/28/14  Yes Max T Hyatt, DPM  DULoxetine (CYMBALTA) 30 MG capsule Take 60 mg by mouth daily.    Yes Historical Provider, MD  enalapril (VASOTEC) 5 MG tablet Take 5 mg by mouth daily.   Yes Historical Provider, MD  estradiol (VIVELLE-DOT) 0.025 MG/24HR Place 1 patch onto the skin 2 (two) times a week.   Yes Historical Provider, MD  fluticasone Aleda Grana(FLONASE) 50 MCG/ACT nasal spray  04/12/14  Yes Historical Provider, MD  gabapentin (NEURONTIN) 300 MG capsule Take 1,200 mg by mouth 4 (four) times daily.    Yes Historical Provider, MD  latanoprost (XALATAN) 0.005 % ophthalmic solution 1 drop at bedtime.   Yes Historical Provider, MD  levothyroxine (SYNTHROID, LEVOTHROID) 125 MCG tablet Take 125 mcg by mouth daily before  breakfast.   Yes Historical Provider, MD  metFORMIN (GLUCOPHAGE) 500 MG tablet Take 1,000 mg by mouth 2 (two) times daily with a meal.    Yes Historical Provider, MD  metoprolol succinate (TOPROL XL) 25 MG 24 hr tablet Take 1 tablet (25 mg total) by mouth daily. 04/18/15  Yes York Spanielharles K Willis, MD  nystatin (MYCOSTATIN/NYSTOP) 100000 UNIT/GM POWD Apply topically.   Yes Historical Provider, MD  Omega-3 Fatty Acids (FISH OIL ULTRA) 1000 MG CAPS Take 2 each by mouth daily.   Yes Historical Provider, MD  nortriptyline (PAMELOR) 25 MG capsule Take 2 capsules (50 mg total) by mouth at bedtime. 05/08/15   York Spanielharles K Willis, MD  rOPINIRole (REQUIP) 3 MG tablet Take 1 tablet (3 mg total) by mouth 2 (two) times daily. 05/08/15   York Spanielharles K Willis, MD    ROS:  Out of a complete 14 system review of symptoms, the patient complains only of the following symptoms, and all other reviewed systems are negative.  Decreased activity, fatigue Ringing in the ears, runny nose Light sensitivity Cough, chest tightness Chest pain Flushing Constipation, nausea Restless legs, sleep apnea, daytime sleepiness, snoring Environmental allergies Joint pain, back pain, neck stiffness Moles, itching Bruising easily Headache, numbness Depression, anxiety  Blood pressure 160/90, pulse 64, height 5' (1.524 m), weight 199 lb 8 oz (90.493 kg).  Physical Exam  General: The patient is alert and cooperative at the time of the examination. The patient is markedly obese.  Neuromuscular: Range of movement of the low back is relatively full, the patient reports discomfort with flexion. She indicates pain with light touch of the right arm and at the ankles.  Skin: No significant peripheral edema is noted.   Neurologic Exam  Mental status: The patient is alert and oriented x 3 at the time of the examination. The patient has apparent normal recent and remote memory, with an apparently normal attention span and concentration  ability.   Cranial nerves: Facial symmetry is present. Speech is normal, no aphasia or dysarthria is noted. Extraocular movements are full. Visual fields are full.  Motor: The patient has good strength in all 4 extremities.  Sensory examination: Soft touch sensation is symmetric on the face, arms, and legs.  Coordination: The patient has good finger-nose-finger and heel-to-shin bilaterally.  Gait and station: The patient has a normal gait. Tandem gait is normal. Romberg is negative. No drift is seen. The patient is able to walk on heels and the toes.  Reflexes: Deep tendon reflexes are symmetric.   Assessment/Plan:  1. Migraine headache  2. Restless leg syndrome  3. Diabetes  4. Back pain, right leg pain  The patient could have some L3 impingement on the right. The patient did  not respond adequately to epidural steroid injections on 2 occasions. The patient has been given Stadol, she has already run out of the medication that was given to her on December 20. The patient will be taken off of trazodone, switched to nortriptyline at 50 mg night to help the back pain and sleep. The Requip will be increased to 3 mg twice daily. She will follow-up in 3 months. She will continue the physical therapy. The patient has significant pain displays on clinical examination, it is possible she could be developing a fibromyalgia syndrome.  Marlan Palau MD 05/08/2015 8:41 AM  [facility] [address] [place], [place] [address]  Phone [phone] Fax [phone]

## 2015-05-09 ENCOUNTER — Encounter: Payer: Self-pay | Admitting: Physical Therapy

## 2015-05-09 ENCOUNTER — Ambulatory Visit: Payer: Medicare HMO | Attending: Physical Medicine and Rehabilitation | Admitting: Physical Therapy

## 2015-05-09 ENCOUNTER — Telehealth: Payer: Self-pay | Admitting: Neurology

## 2015-05-09 DIAGNOSIS — M25552 Pain in left hip: Secondary | ICD-10-CM | POA: Diagnosis present

## 2015-05-09 DIAGNOSIS — M62838 Other muscle spasm: Secondary | ICD-10-CM | POA: Insufficient documentation

## 2015-05-09 DIAGNOSIS — R531 Weakness: Secondary | ICD-10-CM

## 2015-05-09 DIAGNOSIS — M25551 Pain in right hip: Secondary | ICD-10-CM | POA: Diagnosis not present

## 2015-05-09 MED ORDER — OXYCODONE HCL 10 MG PO TABS: 10.0000 mg | ORAL_TABLET | Freq: Four times a day (QID) | ORAL | Status: DC | PRN

## 2015-05-09 NOTE — Telephone Encounter (Signed)
I called patient. The patient wants to switch back to the oxycodone that she was on. I indicated that this is okay, but she cannot take Stadol and oxycodone. I will discontinue the Stadol.

## 2015-05-09 NOTE — Telephone Encounter (Signed)
Pt called and says she is in sever back pain. She also says her restless legs was so bad last night that she did not sleep. She says the medication that was prescribed to her the last office visit is not helping at. Please call and advise [phone]

## 2015-05-09 NOTE — Therapy (Signed)
[facility] PHYSICAL AND SPORTS MEDICINE [address] [place], [place], [address] Phone: [phone]   Fax:  [phone]  Physical Therapy Treatment  Patient Details  Name: Jessica HaggisGail Jessica Cole Jessica Cole MRN: [id number] Date of Birth: [birth year] Referring Provider: Tia AlertJones, David S MD  Encounter Date: 05/09/2015      PT End of Session - 05/09/15 1115    Visit Number 8   Number of Visits 12   Date for PT Re-Evaluation 05/22/15   Authorization Type 8   Authorization Time Period Gcode 10   PT Start Time 1010   PT Stop Time 1115   PT Time Calculation (min) 65 min   Activity Tolerance Patient tolerated treatment well;Patient limited by pain   Behavior During Therapy [facility] for tasks assessed/performed      Past Medical History  Diagnosis Date  . Headache(784.0)   . Obesity   . OSA on CPAP   . Environmental allergies   . Renal calculi   . Plantar fasciitis     right  . Glaucoma   . Renal calculi   . Colon polyps   . Degenerative arthritis   . RLS (restless legs syndrome) 08/23/2014    Past Surgical History  Procedure Laterality Date  . Abdominal hysterectomy    . Tonsillectomy    . Appendectomy    . Breast biopsy    . Arthroscopic surgery, knee Left     There were no vitals filed for this visit.  Visit Diagnosis:  Bilateral hip pain  Weakness generalized      Subjective Assessment - 05/09/15 1012    Subjective Patient reports she is still only feeling temporary relief of symptoms with therapy and using different remedies at home. She reports she is feeling better today in general with less hip/back pain. She is still not sleeping well at night and has only gotten up to 4 hours of sleep in the past 2 days per fitbit. She has not been exercising regularly.    Limitations Sitting;Standing;Walking;House hold activities;Other (comment)   Patient Stated Goals patient would like to decreased leg pain  and be able to perform normal activity without  leg symptoms bothering her, especially at night   Currently in Pain? Yes   Pain Score 4    Pain Location Other (Comment)  right lower back and buttock   Pain Orientation Right   Pain Descriptors / Indicators Aching   Pain Type Chronic pain   Pain Onset More than a month ago   Pain Frequency Intermittent   Aggravating Factors  prolonged sitting, standing and walking   Pain Relieving Factors medication and lying down to rest (supine) or change her position   Effect of Pain on Daily Activities limited with daily activities involving standing such as washing dishes     Objective;  Palpation; able to tolerate deeper palpation along cervical spine to lumbar spine with tenderness less than previous session Gait; guarded, decreased trunk rotation       [facility] Adult PT Treatment/Exercise - 05/09/15 1020    Exercises   Exercises Other Exercises   Other Exercises  patient performed exercises with guidance, verbal and tactile cues and demonstration of PT: Sitting stabilization: ball between knees for hip adduction with glute squeeze x 15 reps, hip abduction/ER with green resistive band x 15 reps, side step  With resistive band and assistance of PT x 5 reps, walk against resistive band with assistance x 10 reps forward and backwards with verbal cues, wedding  march on diagonal with guided verbal cuing x 1 min. , step ups forward x 10 with each LE leading and lateral step up/overs x 10 reps with verbal cuing and following demonstration   Modalities   Modalities Electrical stimulation; moist heat   Electrical Stimulation   Electrical Stimulation Location With patient seated in massage chair PT applied (4) electrodes to patient lower thoracic and lumbar spine paraspinal muscles over areas of pain, spasms with moist heat pack applied to same x 15 min.   Electrical Stimulation Goals Pain  reduction of spasms      Soft tissue mobilization to back musculature from cervical spine to lumbar spine with  concentration on superficial techniques to improve elasticity and decreased hyper sensitivity and reduce spasms with patient seated in massage chair followed by  Estim. With moist heat   Patient response to treatment: improved soft tissue mobility by 25% and decreased tenderness, stiff with increased right lower back pain following 25 min. Of sitting for treatment, patient verbalized understanding of the need to move and exercise to see improvement in pain and mobility that lasts for longer periods of time, she was able to complete all exercises with some increased lower back pain/discomfort to 6/10        PT Education - 05/09/15 1115    Education provided Yes   Education Details HEP; perform walking exercises daily and continue to move and not stay in one position for any prolonged period   Person(s) Educated Patient   Methods Explanation;Demonstration;Verbal cues   Comprehension Verbalized understanding;Returned demonstration;Verbal cues required             PT Long Term Goals - 04/10/15 1220    PT LONG TERM GOAL #1   Title Patient will be independent with home program without cuing for self management exercises and pain control, posture awareness by 05/22/2015   Baseline Patient has limited knowledge of appropriate pain control strategies and progressin of exercises to allow return to full function without difficulty/pain   Status New   PT LONG TERM GOAL #2   Title Patient will demonstrate improved self perceived disability on Modified Oswestry to 40% or less indicatiing improved function with daily activities and sleeping by 05/04/2015   Baseline Modified Oswestry score = 52% (severe self perceived disablity)   Status New   PT LONG TERM GOAL #3   Title Patient will demonstrate improved self perceived disability on Modified Oswestry to 30% or less indicatiing improved function with daily activities and sleeping by 05/22/2015   Baseline Modified Oswestry score = 52%   Status New    PT LONG TERM GOAL #4   Title Patient will iimproved self perceived disability with LE's by improved function with daily activities with with less difficulty as demonstrated by LEFS score of 40/64 by 05/22/2015   Baseline LEFS = 12/64 (severe self perceived disability)   Status New               Plan - 05/09/15 1115    Clinical Impression Statement Patient demonstrated good technique with exercises with verbal cuing and was able to complete all exercises with mild to moderate increased pain in lower back and right hip region. she continues with incresaed right hip/lower back pain with static positions. She requires assistance and guidance to perform all exercises with guidance for appropriate intensity and technique.   Pt will benefit from skilled therapeutic intervention in order to improve on the following deficits Pain;Impaired flexibility;Difficulty walking;Increased muscle spasms;Decreased strength  Rehab Potential Fair   PT Frequency 2x / week   PT Duration 6 weeks   PT Treatment/Interventions Manual techniques;Electrical Stimulation;Moist Heat;Patient/family education;Cryotherapy;Therapeutic exercise;Dry needling;Ultrasound   PT Next Visit Plan Pain control, progressive exercise as tolerated, manual soft tissue mobilzation   PT Home Exercise Plan continue with home exercises: include diagonal wedding march and resistive band walking every 2 days        Problem List Patient Active Problem List   Diagnosis Date Noted  . Fatty infiltration of liver 03/15/2015  . Aortic valve stenosis, nonrheumatic 01/18/2015  . Sciatica of right side 01/09/2015  . Type 2 diabetes mellitus (HCC) 11/10/2014  . RLS (restless legs syndrome) 08/23/2014  . Neuritis or radiculitis due to rupture of lumbar intervertebral disc 06/13/2014  . Degeneration of intervertebral disc of lumbar region 06/13/2014  . Arthritis 01/23/2014  . Arthritis of knee, degenerative 11/15/2013  . Depression 09/29/2013   . Insomnia 09/29/2013  . Apnea, sleep 08/29/2013  . Calculus of kidney 08/29/2013  . Abnormal presence of protein in urine 08/29/2013  . Headache, migraine 08/29/2013  . BP (high blood pressure) 08/29/2013  . HLD (hyperlipidemia) 08/29/2013  . Allergic rhinitis 08/29/2013  . Intractable migraine without aura 02/18/2013    Carl Best 05/10/2015, 9:30 AM  [facility] PHYSICAL AND SPORTS MEDICINE [address] [place], [address] Phone: [phone]   Fax:  [phone]  Name: Jessica Jessica Cole MRN: [id number] Date of Birth: 13-May-1950

## 2015-05-09 NOTE — Telephone Encounter (Signed)
I called the patient. She complained that the nortriptyline is not helping at all. I advised that she only started it yesterday and it needs more time to work. She states that she is pretty sure she tried it in the past and it didn't help. She states she needs something strong and fast acting. I advised that I would let Dr. Anne HahnWillis know.

## 2015-05-10 ENCOUNTER — Telehealth: Payer: Self-pay | Admitting: *Deleted

## 2015-05-10 ENCOUNTER — Telehealth: Payer: Self-pay | Admitting: Neurology

## 2015-05-10 NOTE — Telephone Encounter (Signed)
No need to change the trazodone if she is not taking the nortriptyline.

## 2015-05-10 NOTE — Telephone Encounter (Signed)
Patient came to pick, up her Rx today. She wanted to make Dr. Anne HahnWillis aware that she is still taking trazodone 1 tablet only.  Please call her if this needs to change. Best call back [phone]

## 2015-05-10 NOTE — Telephone Encounter (Signed)
Spoke with patient and informed her Oxycodone script ready for pick up at front desk. She verbalized understanding, stated she would come today. She is aware of office hours today, tomorrow.

## 2015-05-11 ENCOUNTER — Ambulatory Visit: Payer: Medicare HMO | Admitting: Physical Therapy

## 2015-05-11 ENCOUNTER — Encounter: Payer: Self-pay | Admitting: Physical Therapy

## 2015-05-11 DIAGNOSIS — M62838 Other muscle spasm: Secondary | ICD-10-CM

## 2015-05-11 DIAGNOSIS — R531 Weakness: Secondary | ICD-10-CM

## 2015-05-11 DIAGNOSIS — M25551 Pain in right hip: Secondary | ICD-10-CM | POA: Diagnosis not present

## 2015-05-11 DIAGNOSIS — M25552 Pain in left hip: Principal | ICD-10-CM

## 2015-05-11 NOTE — Therapy (Signed)
[place] [facility] PHYSICAL AND SPORTS MEDICINE [address] [place], [address] Phone: [phone]   Fax:  [phone]  Physical Therapy Treatment  Patient Details  Name: Jessica Cole MRN: [id number] Date of Birth: 1951-01-30 Referring Shirell Struthers: Tia Alert MD  Encounter Date: 05/11/2015      PT End of Session - 05/11/15 1145    Visit Number 9   Number of Visits 12   Date for PT Re-Evaluation 05/22/15   Authorization Type 9   Authorization Time Period Gcode 10   PT Start Time 1052   PT Stop Time 1135   PT Time Calculation (min) 43 min   Activity Tolerance Patient tolerated treatment well;Patient limited by pain   Behavior During Therapy [facility] for tasks assessed/performed      Past Medical History  Diagnosis Date  . Headache(784.0)   . Obesity   . OSA on CPAP   . Environmental allergies   . Renal calculi   . Plantar fasciitis     right  . Glaucoma   . Renal calculi   . Colon polyps   . Degenerative arthritis   . RLS (restless legs syndrome) 08/23/2014    Past Surgical History  Procedure Laterality Date  . Abdominal hysterectomy    . Tonsillectomy    . Appendectomy    . Breast biopsy    . Arthroscopic surgery, knee Left     There were no vitals filed for this visit.  Visit Diagnosis:  Bilateral hip pain  Weakness generalized  Spasm of muscle      Subjective Assessment - 05/11/15 1057    Subjective Patient reports she has changed medication to help with sleep because she did not feel the other was working. She is now able to sleep better with medication change.    Limitations Sitting;Standing;Walking;House hold activities;Other (comment)   Patient Stated Goals patient would like to decrease leg pain  and be able to perform normal activity without leg symptoms bothering her, especially at night   Currently in Pain? Yes   Pain Score 5    Pain Location --  right lower back and buttock into LE   Pain  Orientation Right   Pain Descriptors / Indicators Aching   Pain Type Chronic pain   Pain Onset More than a month ago   Pain Frequency Intermittent      Objective: Gait: antalgic gait pattern, limp on right LE Palpation: point tender right hip gluteal medius and piriformis       [facility] Adult PT Treatment/Exercise - 05/11/15 1104    Exercises   Exercises Other Exercises   Other Exercises  Exercises: patient performed exercises with guidance, verbal and tactile cues and demonstration of PT: NuStep with guidance, verbal cues and monitoring pain constantly x 5 min., with repositioning back and hip to assist with reduction of spasms and pain in right hip region, side lying clam, stretch into ER right hip, palpation right hip/piriformis point tender and gluteus medius with point tenderness/spasms, hip flexion/extension in side lying 2 x 5 reps with assistance to un weight LE, piriformis stretch in side lying with IR and ER with assistance 3 x 10 seconds with instructions to perform at home                       Manual therapy: STM performed to lateral aspect right hip with patient in side lying left   Patient response to treatment: all exercises increased  pain slightly and no worse at end of session, sidelying exercises less than standing or sitting, point tenderness right hip with STM superficial and gentle pressure used to relax muscles        PT Long Term Goals - 04/10/15 1220    PT LONG TERM GOAL #1   Title Patient will be independent with home program without cuing for self management exercises and pain control, posture awareness by 05/22/2015   Baseline Patient has limited knowledge of appropriate pain control strategies and progressin of exercises to allow return to full function without difficulty/pain   Status New   PT LONG TERM GOAL #2   Title Patient will demonstrate improved self perceived disability on Modified Oswestry to 40% or less indicatiing improved function with  daily activities and sleeping by 05/04/2015   Baseline Modified Oswestry score = 52% (severe self perceived disablity)   Status New   PT LONG TERM GOAL #3   Title Patient will demonstrate improved self perceived disability on Modified Oswestry to 30% or less indicatiing improved function with daily activities and sleeping by 05/22/2015   Baseline Modified Oswestry score = 52%   Status New   PT LONG TERM GOAL #4   Title Patient will iimproved self perceived disability with LE's by improved function with daily activities with with less difficulty as demonstrated by LEFS score of 40/64 by 05/22/2015   Baseline LEFS = 12/64 (severe self perceived disability)   Status New               Plan - 05/11/15 1342    Clinical Impression Statement Modified exercises and patient was able to perform some through limited pain. She continues with pain in right hip and lower back that is responding slowly and with no consistent results. She is trying to exercise at home within paiin tolerance.    Pt will benefit from skilled therapeutic intervention in order to improve on the following deficits Pain;Impaired flexibility;Difficulty walking;Increased muscle spasms;Decreased strength   Rehab Potential Fair   PT Frequency 2x / week   PT Duration 6 weeks   PT Treatment/Interventions Manual techniques;Electrical Stimulation;Moist Heat;Patient/family education;Cryotherapy;Therapeutic exercise;Dry needling;Ultrasound   PT Next Visit Plan Pain control, progressive exercise as tolerated, manual soft tissue mobilzation   PT Home Exercise Plan continue with home exercises: include diagonal wedding march and resistive band walking every 2 days        Problem List Patient Active Problem List   Diagnosis Date Noted  . Fatty infiltration of liver 03/15/2015  . Aortic valve stenosis, nonrheumatic 01/18/2015  . Sciatica of right side 01/09/2015  . Type 2 diabetes mellitus (HCC) 11/10/2014  . RLS (restless legs  syndrome) 08/23/2014  . Neuritis or radiculitis due to rupture of lumbar intervertebral disc 06/13/2014  . Degeneration of intervertebral disc of lumbar region 06/13/2014  . Arthritis 01/23/2014  . Arthritis of knee, degenerative 11/15/2013  . Depression 09/29/2013  . Insomnia 09/29/2013  . Apnea, sleep 08/29/2013  . Calculus of kidney 08/29/2013  . Abnormal presence of protein in urine 08/29/2013  . Headache, migraine 08/29/2013  . BP (high blood pressure) 08/29/2013  . HLD (hyperlipidemia) 08/29/2013  . Allergic rhinitis 08/29/2013  . Intractable migraine without aura 02/18/2013    Beacher MayBrooks, Marie PT 05/11/2015, 7:07 PM  [place] [facility] [address] , [place], [address] Phone: [phone]   Fax:  [phone]  Name: Phillis HaggisGail Thacker Rothe MRN: [id number] Date of Birth: 01-14-1951

## 2015-05-15 ENCOUNTER — Ambulatory Visit: Payer: Medicare HMO | Admitting: Physical Therapy

## 2015-05-15 ENCOUNTER — Encounter: Payer: Self-pay | Admitting: Physical Therapy

## 2015-05-15 DIAGNOSIS — M62838 Other muscle spasm: Secondary | ICD-10-CM

## 2015-05-15 DIAGNOSIS — M25552 Pain in left hip: Principal | ICD-10-CM

## 2015-05-15 DIAGNOSIS — M25551 Pain in right hip: Secondary | ICD-10-CM | POA: Diagnosis not present

## 2015-05-15 DIAGNOSIS — R531 Weakness: Secondary | ICD-10-CM

## 2015-05-15 NOTE — Therapy (Signed)
[name] PH[facility]ICAL AND SPORTS MEDICINE [address] [place], [address] Phone: [phone]   Fax:  [phone]  Physical Therapy Treatment/Discharge Summary  Patient Details  Name: Jessica Cole MRN: [id number] Date of Birth: [birth year] Referring Provider: Eustace Moore MD  Encounter Date: 05/15/2015   Patient began physical therapy on 04/10/2015 and attended 10 session through 05/15/2015 with goals not met for improving pain/modified oswestry score. She is independent with home program. No significant changes have been noted with physical therapy intervention and recommend discharge at this time with re assessment by MD to address further reasons for continued pain.       PT End of Session - 05/15/15 1402    Visit Number 10   Number of Visits 12   Date for PT Re-Evaluation 05/22/15   Authorization Type 10   Authorization Time Period Gcode 10   PT Start Time 1355   PT Stop Time 1443   PT Time Calculation (min) 48 min   Activity Tolerance Patient limited by pain      Past Medical History  Diagnosis Date  . Headache(784.0)   . Obesity   . OSA on CPAP   . Environmental allergies   . Renal calculi   . Plantar fasciitis     right  . Glaucoma   . Renal calculi   . Colon polyps   . Degenerative arthritis   . RLS (restless legs syndrome) 08/23/2014    Past Surgical History  Procedure Laterality Date  . Abdominal hysterectomy    . Tonsillectomy    . Appendectomy    . Breast biopsy    . Arthroscopic surgery, knee Left     There were no vitals filed for this visit.  Visit Diagnosis:  Bilateral hip pain  Spasm of muscle  Weakness generalized      Subjective Assessment - 05/15/15 1356    Subjective Paitent reports she is "hurting" on right side lower back across into left side lower back and was hurting into both LE's last night. She continues to monitor sleep habits with fitbit and is getting 6-8 hours a night  with 2-3 wakeful moments and up to 18 restless moments. She arrived today without pain mediation to see how much pain she was in. She reports no real change in pain intensity or location and only temporary relief of symptoms with exercises and pain control strategies at home .    Limitations Sitting;Standing;Walking;House hold activities;Other (comment)   Patient Stated Goals patient would like to decrease leg pain  and be able to perform normal activity without leg symptoms bothering her, especially at night   Currently in Pain? Yes   Pain Score 8    Pain Location Other (Comment)  lower back both sides into right hip and into both legs (did not take pain pill this morning)   Pain Orientation Right;Left   Pain Descriptors / Indicators Spasm;Aching   Pain Type Chronic pain   Pain Onset More than a month ago   Pain Frequency Intermittent         Objective: Gait: ambulating without AD without loss of balance noted, slow cadence and guarded posture with decreased trunk rotation AROM: lumbar spine limited forward flexion 50% with reaching and climbing back up LE's, extension 25% decrease without increased pain in back/hips reported, lateral flexion right increased right sided hip/back symptoms, left lateral flexion without increased symptoms, LE's hip ROM decreased ER on right 25% as compared to left  with reported increased hip pain Strength: LE's grossly major muscle groups WNL's without pain with exception of hip flexion in sitting with  Pain limiting amount of resistance taken Palpation: right hip/piriformis and gluteal muscles with spasms and reproduction of symptoms, unable to palpate fully due to extreme tenderness Special tests: right hip;  piriformis + tenderness and pain with IR, + pain in right hip with hip flexed to 90 with resisted hip abduction Both hips increased pain today with all exercise and movements        [facility] Adult PT Treatment/Exercise - 05/15/15 1402    Exercises    Exercises Other Exercises   Other Exercises  Moist heat applied to back with patient seated while filling out Modified oswestry questionnaires (15 min.)   re assessed exercises, strength and ROM with guidance and assistance of PT and verbal /tactile cues as needed: standing lumbar flexion through partial range with increased lower back pain, extension through partial ROM with increased back pain, lateral flexion right with increased right back/hip pain, side lying clam with assistance for positioning and un weighting of LE performed x 10 reps with same pain in right hip each repetition, supine bridging x 5 reps same pain no change, supine TrA contraction/stabilization with marching each LE x 5 reps same pain, no change, verbally reviewed other exercises for home with patient verbalizing good understanding                        Manual therapy: STM gluteal and piriformis muscles with patient in side lying, superficial for relaxation/improve soft tissue mobility to allow improved ability to perform exercises   Patient response to treatment;  No change in pain today with any assessment, exercise, stretching. She demonstrated good understanding of exercises for home and the need to work with her pain level to not allow it to increase above 3-5/10, she did exercise with assistance of therapist through all exercises with continued reports of same pain in both hips/LE's and backMOist heat         PT Education - 05/15/15 1410    Education provided Yes   Education Details HEP re assessed   Person(s) Educated Patient   Methods Explanation   Comprehension Verbalized understanding             PT Long Term Goals - 05/15/15 1618    PT LONG TERM GOAL #1   Title Patient will be independent with home program without cuing for self management exercises and pain control, posture awareness by 05/22/2015   Baseline Patient has limited knowledge of appropriate pain control strategies and progressin of  exercises to allow return to full function without difficulty/pain   Status Achieved   PT LONG TERM GOAL #2   Title Patient will demonstrate improved self perceived disability on Modified Oswestry to 40% or less indicatiing improved function with daily activities and sleeping by 05/04/2015   Baseline Modified Oswestry score = 52% (severe self perceived disablity) (current: 54%)   Status Not Met   PT LONG TERM GOAL #4   Title Patient will iimproved self perceived disability with LE's by improved function with daily activities with with less difficulty as demonstrated by LEFS score of 40/64 by 05/22/2015   Baseline LEFS = 12/64 (severe self perceived disability)   Status Not Met               Plan - 05/15/15 1445    Clinical Impression Statement Patient continues with pain as  primary limitation to being able to perform exercises and has only achieved temporary relief of symptoms with any modality including [place], manual techniques, estim.., exercise and self treatment with different pain relieving creams at home. She has good understanding of exercises for flexiblity, core control/strength and walking activties and it is recommented to discharge from physical therapy at this time due to minimal improvement noted. Her current impairment level remains ~50% based on Modified Oswestry low back pain questionairre score of 54% (initially 52%), and clinical judgment and pain scale.    Pt will benefit from skilled therapeutic intervention in order to improve on the following deficits Pain;Impaired flexibility;Difficulty walking;Increased muscle spasms;Decreased strength   Rehab Potential Fair   PT Frequency 2x / week   PT Duration 6 weeks   PT Treatment/Interventions Manual techniques;Electrical Stimulation;Moist Heat;Patient/family education;Cryotherapy;Therapeutic exercise;Dry needling;Ultrasound   PT Next Visit Plan Discharge from physical therapy to self management of exercise/pain           G-Codes - 06-02-2015 1623    Functional Assessment Tool Used pain scale, clinical judgment, modified oswestry, LEFS   Functional Limitation Mobility: Walking and moving around   Mobility: Walking and Moving Around Goal Status [phone]) At least 1 percent but less than 20 percent impaired, limited or restricted   Mobility: Walking and Moving Around Discharge Status [phone]) At least 40 percent but less than 60 percent impaired, limited or restricted      Problem List Patient Active Problem List   Diagnosis Date Noted  . Fatty infiltration of liver 03/15/2015  . Aortic valve stenosis, nonrheumatic 01/18/2015  . Sciatica of right side 01/09/2015  . Type 2 diabetes mellitus ([place]) 11/10/2014  . RLS (restless legs syndrome) 08/23/2014  . Neuritis or radiculitis due to rupture of lumbar intervertebral disc 06/13/2014  . Degeneration of intervertebral disc of lumbar region 06/13/2014  . Arthritis 01/23/2014  . Arthritis of knee, degenerative 11/15/2013  . Depression 09/29/2013  . Insomnia 09/29/2013  . Apnea, sleep 08/29/2013  . Calculus of kidney 08/29/2013  . Abnormal presence of protein in urine 08/29/2013  . Headache, migraine 08/29/2013  . BP (high blood pressure) 08/29/2013  . HLD (hyperlipidemia) 08/29/2013  . Allergic rhinitis 08/29/2013  . Intractable migraine without aura 02/18/2013    Aldona Lento 05/16/2015, 8:48 AM  [place] PHYSICAL AND SPORTS MEDICINE [address] [place], [address] Phone: [phone]   Fax:  [phone]  Name: Jessica Cole MRN: [id number] Date of Birth: 1951/05/05

## 2015-05-16 DIAGNOSIS — I10 Essential (primary) hypertension: Secondary | ICD-10-CM | POA: Insufficient documentation

## 2015-05-16 DIAGNOSIS — D696 Thrombocytopenia, unspecified: Secondary | ICD-10-CM | POA: Insufficient documentation

## 2015-05-16 DIAGNOSIS — F3342 Major depressive disorder, recurrent, in full remission: Secondary | ICD-10-CM | POA: Insufficient documentation

## 2015-05-16 DIAGNOSIS — E119 Type 2 diabetes mellitus without complications: Secondary | ICD-10-CM | POA: Insufficient documentation

## 2015-05-17 ENCOUNTER — Ambulatory Visit: Payer: Medicare HMO | Admitting: Physical Therapy

## 2015-05-18 ENCOUNTER — Other Ambulatory Visit: Payer: Self-pay

## 2015-05-18 MED ORDER — METOPROLOL SUCCINATE ER 25 MG PO TB24: 25.0000 mg | ORAL_TABLET | Freq: Every day | ORAL | Status: DC

## 2015-05-18 NOTE — Telephone Encounter (Signed)
Originally written at [facility] on 09/06

## 2015-05-28 ENCOUNTER — Telehealth: Payer: Self-pay | Admitting: Neurology

## 2015-05-28 MED ORDER — OXYCODONE HCL 10 MG PO TABS: 10.0000 mg | ORAL_TABLET | Freq: Four times a day (QID) | ORAL | Status: DC | PRN

## 2015-05-28 NOTE — Telephone Encounter (Signed)
I will write a prescription, must last 21 days.

## 2015-05-28 NOTE — Telephone Encounter (Signed)
Pt called requesting refill for Oxycodone HCl 10 MG TABS the patient said the problem with her back has gotten worse and she had to increase medication. She is taking 1/2 tab during the day and 1 tab at night. If she has a real bad day she will take 1/2 tab around lunch and then 1/2 tab within 4-5 hrs and then at night she takes 1 tab. She has 4-1/2 tabs left.

## 2015-05-29 ENCOUNTER — Telehealth: Payer: Self-pay

## 2015-05-29 NOTE — Telephone Encounter (Signed)
Rx ready for pick up. 

## 2015-06-01 ENCOUNTER — Telehealth: Payer: Self-pay | Admitting: Neurology

## 2015-06-01 NOTE — Telephone Encounter (Signed)
I called patient. The patient indicates that the headache is actually improving at this point. She does have diabetes, but her hemoglobin A1c is relatively low, could use a short course of prednisone if needed. She will call our office back if the headache worsens.

## 2015-06-01 NOTE — Telephone Encounter (Signed)
Patient is calling. She has had a migraine for over 3 days. The patient has taken Oxycodone HCl 10 MG TABS but it has not helped. Please call to discuss. The [facility] uses [facility] [place].

## 2015-06-19 ENCOUNTER — Telehealth: Payer: Self-pay | Admitting: Neurology

## 2015-06-19 ENCOUNTER — Telehealth: Payer: Self-pay

## 2015-06-19 MED ORDER — OXYCODONE HCL 10 MG PO TABS: 10.0000 mg | ORAL_TABLET | Freq: Four times a day (QID) | ORAL | Status: DC | PRN

## 2015-06-19 NOTE — Telephone Encounter (Signed)
Pt called and is requesting a refill on Oxycodone HCl 10 MG TABS, just enough to last till her surgery consult, Feb 21. May call pt [phone]

## 2015-06-19 NOTE — Telephone Encounter (Signed)
Rx ready for pick up. 

## 2015-06-19 NOTE — Telephone Encounter (Signed)
The prescription is for 40 tablets, must last 21 days, okay to refill.

## 2015-06-27 ENCOUNTER — Other Ambulatory Visit: Payer: Self-pay | Admitting: Neurological Surgery

## 2015-06-27 DIAGNOSIS — M545 Low back pain: Principal | ICD-10-CM

## 2015-06-27 DIAGNOSIS — G8929 Other chronic pain: Secondary | ICD-10-CM

## 2015-07-05 ENCOUNTER — Ambulatory Visit
Admission: RE | Admit: 2015-07-05 | Discharge: 2015-07-05 | Disposition: A | Discharge status: Home / Self Care | Payer: Medicare HMO | Source: Ambulatory Visit | Attending: Neurological Surgery | Admitting: Neurological Surgery

## 2015-07-05 ENCOUNTER — Telehealth: Payer: Self-pay | Admitting: Diagnostic Radiology

## 2015-07-05 VITALS — BP 138/64 | HR 61

## 2015-07-05 DIAGNOSIS — M5136 Other intervertebral disc degeneration, lumbar region: Secondary | ICD-10-CM

## 2015-07-05 DIAGNOSIS — M5116 Intervertebral disc disorders with radiculopathy, lumbar region: Secondary | ICD-10-CM

## 2015-07-05 DIAGNOSIS — M5431 Sciatica, right side: Secondary | ICD-10-CM

## 2015-07-05 DIAGNOSIS — G8929 Other chronic pain: Secondary | ICD-10-CM

## 2015-07-05 DIAGNOSIS — M545 Low back pain, unspecified: Secondary | ICD-10-CM

## 2015-07-05 DIAGNOSIS — M51369 Other intervertebral disc degeneration, lumbar region without mention of lumbar back pain or lower extremity pain: Secondary | ICD-10-CM

## 2015-07-05 MED ORDER — IOHEXOL 180 MG/ML  SOLN
17.0000 mL | Freq: Once | INTRAMUSCULAR | Status: AC | PRN
Start: 2015-07-05 — End: 2015-07-05
  Administered 2015-07-05: 17 mL via INTRATHECAL

## 2015-07-05 MED ORDER — ONDANSETRON HCL 4 MG/2ML IJ SOLN
4.0000 mg | Freq: Once | INTRAMUSCULAR | Status: AC
  Administered 2015-07-05: 4 mg via INTRAMUSCULAR

## 2015-07-05 MED ORDER — DIAZEPAM 5 MG PO TABS
10.0000 mg | ORAL_TABLET | Freq: Once | ORAL | Status: AC
  Administered 2015-07-05: 5 mg via ORAL

## 2015-07-05 MED ORDER — MEPERIDINE HCL 100 MG/ML IJ SOLN
75.0000 mg | Freq: Once | INTRAMUSCULAR | Status: AC
  Administered 2015-07-05: 75 mg via INTRAMUSCULAR

## 2015-07-05 MED ORDER — OXYCODONE-ACETAMINOPHEN 5-325 MG PO TABS
2.0000 | ORAL_TABLET | Freq: Once | ORAL | Status: AC
  Administered 2015-07-05: 2 via ORAL

## 2015-07-05 NOTE — Progress Notes (Signed)
Patient states she has been off Buspar, Cymbalta and Trazodone for at least the past two days.   

## 2015-07-05 NOTE — Telephone Encounter (Signed)
S/p myelogram at [facility] radiology facility today. Called radiology dept req to speak to a MD. States she had worsening of patient' sbaseline pain after walking around her house tonight.  States that it is similar to her baseline pain (LBP radiating to feet without incontinence) but worse and that she took more than her standard dose of oxycontin without relief.  Pt advised to limit PO oxycodone to her standard rx, to take OTC ibuprofen or similar that she has and to try ice packs.  She was advised to call her PCP for pain management or go to ER if symptoms were beyond her management, and instructed to call the imaging facility in the a.m.  Pt confirmed plan.

## 2015-07-05 NOTE — Discharge Instructions (Signed)
Myelogram Discharge Instructions  1. Go home and rest quietly for the next 24 hours.  It is important to lie flat for the next 24 hours.  Get up only to go to the restroom.  You may lie in the bed or on a couch on your back, your stomach, your left side or your right side.  You may have one pillow under your head.  You may have pillows between your knees while you are on your side or under your knees while you are on your back.  2. DO NOT drive today.  Recline the seat as far back as it will go, while still wearing your seat belt, on the way home.  3. You may get up to go to the bathroom as needed.  You may sit up for 10 minutes to eat.  You may resume your normal diet and medications unless otherwise indicated.  Drink lots of extra fluids today and tomorrow.  4. The incidence of headache, nausea, or vomiting is about 5% (one in 20 patients).  If you develop a headache, lie flat and drink plenty of fluids until the headache goes away.  Caffeinated beverages may be helpful.  If you develop severe nausea and vomiting or a headache that does not go away with flat bed rest, call [phone].  5. You may resume normal activities after your 24 hours of bed rest is over; however, do not exert yourself strongly or do any heavy lifting tomorrow. If when you get up you have a headache when standing, go back to bed and force fluids for another 24 hours.  6. Call your physician for a follow-up appointment.  The results of your myelogram will be sent directly to your physician by the following day.  7. If you have any questions or if complications develop after you arrive home, please call [phone].  Discharge instructions have been explained to the patient.  The patient, or the person responsible for the patient, fully understands these instructions.       May resume Cymbalta, Buspar and Trazodone on July 06, 2015, after 8:00 am.

## 2015-07-18 DIAGNOSIS — I700 Atherosclerosis of aorta: Secondary | ICD-10-CM | POA: Insufficient documentation

## 2015-08-06 ENCOUNTER — Encounter: Payer: Self-pay | Admitting: Adult Health

## 2015-08-06 ENCOUNTER — Ambulatory Visit (INDEPENDENT_AMBULATORY_CARE_PROVIDER_SITE_OTHER): Payer: Medicare HMO | Admitting: Adult Health

## 2015-08-06 VITALS — BP 162/102 | HR 84 | Resp 20 | Ht 60.0 in | Wt 192.0 lb

## 2015-08-06 DIAGNOSIS — M5431 Sciatica, right side: Secondary | ICD-10-CM | POA: Diagnosis not present

## 2015-08-06 DIAGNOSIS — G2581 Restless legs syndrome: Secondary | ICD-10-CM

## 2015-08-06 NOTE — Progress Notes (Signed)
PATIENT: Jessica Cole DOB: 1950/10/16  REASON FOR VISIT: follow up HISTORY FROM: patient  HISTORY OF PRESENT ILLNESS: Jessica Cole is a 65 year old female with a history of obesity, low back pain with right-sided sciatica, restless legs and headaches. She returns today for follow-up. She reports that her primary complaint is her ongoing back pain. She states that she had a myelogram with Dr. Yetta Barre and has a follow-up appointment tomorrow. She is hoping that surgery will be an option for her. She reports that her back pain exacerbates her restless legs. She states that she feels that the Requip would work well for her if she did not have back pain as well. The patient is currently taking trazodone to help with sleep however she states that the back pain usually prohibits this. She continues to take oxycodone for her discomfort. She denies any new neurological symptoms. She returns today for an evaluation.  HISTORY 05/08/15 (WILLIS): Jessica Cole is a 65 year old right-handed white female with a history of obesity, low back pain with right-sided sciatica, and migraine headache. The patient also reports difficulty with restless leg syndrome. The patient has undergone MRI evaluation of the low back showing possible right L3 nerve root compression, the patient has been seen by neurosurgery. The patient has not been considered for surgery at this time, she was sent for physical therapy. The patient is engaged in physical therapy at this time. The patient reports some variability with her headaches, in October and November, she had 3 headaches each month, but she had 11 headaches in December. The patient indicates that weather may be a factor in her headache. The patient is on gabapentin and Cymbalta currently. She takes trazodone at night 200 mg for sleep. She has restless leg syndrome and she takes Requip taking 2 mg twice daily. The patient is having some issues with restless legs at this time. The  patient continues to have back pain that wakes her up at night, she has pain in the low back radiating down the leg to the ankle. No weakness has been noted. The patient reports that the pain is now starting some on the left side.  REVIEW OF SYSTEMS: Out of a complete 14 system review of symptoms, the patient complains only of the following symptoms, and all other reviewed systems are negative.  See history of present illness  ALLERGIES: Allergies  Allergen Reactions  . Dexamethasone Other (See Comments)    During a tapered dose, once.  Was shaky (side effect, not allergy).    HOME MEDICATIONS: Outpatient Prescriptions Prior to Visit  Medication Sig Dispense Refill  . aspirin 81 MG tablet Take 81 mg by mouth daily.     . Azelastine-Fluticasone (DYMISTA) 137-50 MCG/ACT SUSP Place into the nose 2 (two) times daily.    . B Complex Vitamins (VITAMIN B COMPLEX PO) Take by mouth daily.    . busPIRone (BUSPAR) 15 MG tablet Take 15 mg by mouth daily.     . diclofenac sodium (VOLTAREN) 1 % GEL Apply 4 g topically 2 (two) times daily. (Patient taking differently: Apply 4 g topically daily as needed. ) 100 g 1  . DULoxetine (CYMBALTA) 30 MG capsule Take 60 mg by mouth daily.     . enalapril (VASOTEC) 5 MG tablet Take 5 mg by mouth daily.    Marland Kitchen estradiol (VIVELLE-DOT) 0.025 MG/24HR Place 1 patch onto the skin 2 (two) times a week.    . fluticasone (FLONASE) 50 MCG/ACT nasal spray 2 (two)  times daily.     Marland Kitchen. gabapentin (NEURONTIN) 300 MG capsule 600 mg 2 (two) times daily.     Marland Kitchen. latanoprost (XALATAN) 0.005 % ophthalmic solution 1 drop at bedtime.    Marland Kitchen. levothyroxine (SYNTHROID, LEVOTHROID) 125 MCG tablet Take 125 mcg by mouth daily before breakfast.    . metFORMIN (GLUCOPHAGE) 500 MG tablet Take 500 mg by mouth 2 (two) times daily with a meal.     . metoprolol succinate (TOPROL XL) 25 MG 24 hr tablet Take 1 tablet (25 mg total) by mouth daily. 90 tablet 0  . nystatin (MYCOSTATIN/NYSTOP) 100000 UNIT/GM  POWD Apply topically daily as needed.     . Omega-3 Fatty Acids (FISH OIL ULTRA) 1000 MG CAPS Take 1 each by mouth 2 (two) times daily.     . Oxycodone HCl 10 MG TABS Take 1 tablet (10 mg total) by mouth every 6 (six) hours as needed. (Patient taking differently: Take 10 mg by mouth every 6 (six) hours as needed. 1-2 tablets every 6 hours prn) 40 tablet 0  . rOPINIRole (REQUIP) 3 MG tablet Take 1 tablet (3 mg total) by mouth 2 (two) times daily. 60 tablet 3  . traZODone (DESYREL) 100 MG tablet Take 200 mg by mouth at bedtime.     No facility-administered medications prior to visit.    PAST MEDICAL HISTORY: Past Medical History  Diagnosis Date  . Headache(784.0)   . Obesity   . OSA on CPAP   . Environmental allergies   . Renal calculi   . Plantar fasciitis     right  . Glaucoma   . Renal calculi   . Colon polyps   . Degenerative arthritis   . RLS (restless legs syndrome) 08/23/2014    PAST SURGICAL HISTORY: Past Surgical History  Procedure Laterality Date  . Abdominal hysterectomy    . Tonsillectomy    . Appendectomy    . Breast biopsy    . Arthroscopic surgery, knee Left     FAMILY HISTORY: Family History  Problem Relation Age of Onset  . Lung cancer Mother   . Congestive Heart Failure Father   . Depression Sister   . Rheum arthritis Sister   . Headache Maternal Grandfather   . Migraines Maternal Grandfather   . Headache Maternal Uncle   . Diabetes    . Heart disease    . Hypertension      SOCIAL HISTORY: Social History   Social History  . Marital Status: Married    Spouse Name: Milinda CaveDickey   . Number of Children: 0  . Years of Education: 12   Occupational History  . retired    Social History Main Topics  . Smoking status: Never Smoker   . Smokeless tobacco: Never Used  . Alcohol Use: No  . Drug Use: No  . Sexual Activity: Not on file   Other Topics Concern  . Not on file   Social History Narrative   Patient is married and lives with her husband.    Patient has a high school education.   Patient has no children.    Patient works for the [employer]   Patient is right handed.   Patient drinks occasionally drinks caffeine.      PHYSICAL EXAM  Filed Vitals:   08/06/15 0920  BP: 162/102  Pulse: 84  Resp: 20  Height: 5' (1.524 m)  Weight: 192 lb (87.091 kg)   Body mass index is 37.5 kg/(m^2).  Generalized: Well developed, in  no acute distress   Neurological examination  Mentation: Alert oriented to time, place, history taking. Follows all commands speech and language fluent Cranial nerve II-XII: Pupils were equal round reactive to light. Extraocular movements were full, visual field were full on confrontational test. Facial sensation and strength were normal. Uvula tongue midline. Head turning and shoulder shrug  were normal and symmetric. Motor: The motor testing reveals 5 over 5 strength of all 4 extremities. Good symmetric motor tone is noted throughout.  Sensory: Sensory testing is intact to soft touch on all 4 extremities. No evidence of extinction is noted.  Coordination: Cerebellar testing reveals good finger-nose-finger and heel-to-shin bilaterally.  Gait and station: Gait is normal. Tandem gait is normal. Romberg is negative. No drift is seen.  Reflexes: Deep tendon reflexes are symmetric and normal bilaterally.   DIAGNOSTIC DATA (LABS, IMAGING, TESTING) - I reviewed patient records, labs, notes, testing and imaging myself where available.      ASSESSMENT AND PLAN 65 y.o. year old female  has a past medical history of Headache(784.0); Obesity; OSA on CPAP; Environmental allergies; Renal calculi; Plantar fasciitis; Glaucoma; Renal calculi; Colon polyps; Degenerative arthritis; and RLS (restless legs syndrome) (08/23/2014). here with:  1. Chronic back pain   2. Restless legs  The patient continues to have significant back pain. She has a follow-up tomorrow with Dr. Yetta Barre. She will continue on the oxycodone.  She states that Dr. Yetta Barre office will prescribe this. We will not make any adjustments to her medication at this time. She will continue on Requip and trazodone. Patient advised that if her symptoms worsen or she develops any new symptoms she should  let us know. She will follow-up in 6 months or sooner if needed.   Butch Penny, MSN, NP-C 08/06/2015, 3:49 PM [facility] [address], [address] [place], [place] [address] [phone]

## 2015-08-06 NOTE — Progress Notes (Signed)
I have read the note, and I agree with the clinical assessment and plan.  Jessica Cole,Jessica Cole   

## 2015-08-06 NOTE — Patient Instructions (Signed)
Continue Trazodone, requip Follow-up with Dr. Yetta BarreJones If your symptoms worsen or you develop new symptoms please let us know.

## 2015-08-07 ENCOUNTER — Other Ambulatory Visit: Payer: Self-pay | Admitting: Neurology

## 2015-08-30 ENCOUNTER — Other Ambulatory Visit: Payer: Self-pay | Admitting: Neurology

## 2015-10-08 ENCOUNTER — Other Ambulatory Visit: Payer: Self-pay | Admitting: Neurology

## 2015-10-24 ENCOUNTER — Other Ambulatory Visit: Payer: Self-pay | Admitting: Physical Medicine and Rehabilitation

## 2015-10-24 DIAGNOSIS — M5136 Other intervertebral disc degeneration, lumbar region: Secondary | ICD-10-CM

## 2015-10-30 NOTE — Discharge Instructions (Signed)
Discogram Post Procedure Discharge Instructions ° °1. May resume a regular diet and any medications that you routinely take (including pain medications). °2. No driving day of procedure. °3. Upon discharge go home and rest for at least 4 hours.  May use an ice pack as needed to injection sites on back.  Ice to back 30 minutes on and 30 minutes off, all day. °4. May remove bandades later, today. °5. It is not unusual to be sore for several days after this procedure. ° ° ° °Please contact our office at [phone] for the following symptoms: ° °· Fever greater than 100 degrees °· Increased swelling, pain, or redness at injection site. ° ° °Thank you for visiting [facility]. ° ° °

## 2015-10-31 ENCOUNTER — Ambulatory Visit
Admission: RE | Admit: 2015-10-31 | Discharge: 2015-10-31 | Disposition: A | Discharge status: Home / Self Care | Payer: Medicare HMO | Source: Ambulatory Visit | Attending: Physical Medicine and Rehabilitation | Admitting: Physical Medicine and Rehabilitation

## 2015-10-31 ENCOUNTER — Other Ambulatory Visit: Payer: Self-pay | Admitting: Physical Medicine and Rehabilitation

## 2015-10-31 DIAGNOSIS — M5136 Other intervertebral disc degeneration, lumbar region: Secondary | ICD-10-CM

## 2015-10-31 MED ORDER — SODIUM CHLORIDE 0.9 % IV SOLN
Freq: Once | INTRAVENOUS | Status: AC
  Administered 2015-10-31: 08:00:00 via INTRAVENOUS

## 2015-10-31 MED ORDER — MIDAZOLAM HCL 2 MG/2ML IJ SOLN
1.0000 mg | INTRAMUSCULAR | Status: DC | PRN
  Administered 2015-10-31 (×3): 1 mg via INTRAVENOUS

## 2015-10-31 MED ORDER — ONDANSETRON HCL 4 MG/2ML IJ SOLN
4.0000 mg | Freq: Once | INTRAMUSCULAR | Status: AC
Start: 2015-10-31 — End: 2015-10-31
  Administered 2015-10-31: 4 mg via INTRAMUSCULAR

## 2015-10-31 MED ORDER — KETOROLAC TROMETHAMINE 30 MG/ML IJ SOLN
30.0000 mg | Freq: Once | INTRAMUSCULAR | Status: AC
  Administered 2015-10-31: 30 mg via INTRAVENOUS

## 2015-10-31 MED ORDER — MEPERIDINE HCL 100 MG/ML IJ SOLN
75.0000 mg | Freq: Once | INTRAMUSCULAR | Status: AC
  Administered 2015-10-31: 75 mg via INTRAMUSCULAR

## 2015-10-31 MED ORDER — CEFAZOLIN SODIUM-DEXTROSE 2-4 GM/100ML-% IV SOLN
2.0000 g | Freq: Once | INTRAVENOUS | Status: AC
  Administered 2015-10-31: 2 g via INTRAVENOUS

## 2015-10-31 MED ORDER — FENTANYL CITRATE (PF) 100 MCG/2ML IJ SOLN
25.0000 ug | INTRAMUSCULAR | Status: DC | PRN
  Administered 2015-10-31: 50 ug via INTRAVENOUS

## 2015-10-31 NOTE — Progress Notes (Signed)
Dr.Curnes in to visit and explain procedure to pt.

## 2015-11-01 ENCOUNTER — Telehealth: Payer: Self-pay | Admitting: Radiology

## 2015-11-01 NOTE — Telephone Encounter (Signed)
Pt had a discogram yesterday. Called to day c/o more pain than usual and wanting pain meds. Explained that any pain meds had to come from her attending physician and that this was not unusual for her to have more pain.

## 2015-11-26 ENCOUNTER — Other Ambulatory Visit: Payer: Self-pay | Admitting: Neurological Surgery

## 2015-11-27 ENCOUNTER — Telehealth: Payer: Self-pay | Admitting: Neurology

## 2015-11-27 NOTE — Telephone Encounter (Signed)
Events noted, I did not call the patient. 

## 2015-11-27 NOTE — Telephone Encounter (Signed)
Patient called to let Dr. Anne Hahn know that she is having lower back surgery on August 10th, Dr. Yetta Barre will be doing the surgery, Dr. Anne Hahn referred her to Dr. Yetta Barre.

## 2015-12-05 ENCOUNTER — Encounter (HOSPITAL_COMMUNITY)
Admission: RE | Admit: 2015-12-05 | Discharge: 2015-12-05 | Disposition: A | Discharge status: Home / Self Care | Payer: Medicare HMO | Source: Ambulatory Visit | Attending: Neurological Surgery | Admitting: Neurological Surgery

## 2015-12-05 ENCOUNTER — Encounter (HOSPITAL_COMMUNITY): Payer: Self-pay

## 2015-12-05 ENCOUNTER — Ambulatory Visit (HOSPITAL_COMMUNITY)
Admission: RE | Admit: 2015-12-05 | Discharge: 2015-12-05 | Disposition: A | Discharge status: Home / Self Care | Payer: Medicare HMO | Source: Ambulatory Visit | Attending: Neurological Surgery | Admitting: Neurological Surgery

## 2015-12-05 DIAGNOSIS — Z01818 Encounter for other preprocedural examination: Secondary | ICD-10-CM | POA: Insufficient documentation

## 2015-12-05 DIAGNOSIS — M5136 Other intervertebral disc degeneration, lumbar region: Secondary | ICD-10-CM

## 2015-12-05 DIAGNOSIS — Z01810 Encounter for preprocedural cardiovascular examination: Secondary | ICD-10-CM | POA: Insufficient documentation

## 2015-12-05 DIAGNOSIS — Z01812 Encounter for preprocedural laboratory examination: Secondary | ICD-10-CM | POA: Diagnosis not present

## 2015-12-05 HISTORY — DX: Type 2 diabetes mellitus without complications: E11.9

## 2015-12-05 HISTORY — DX: Essential (primary) hypertension: I10

## 2015-12-05 HISTORY — DX: Other specified postprocedural states: Z98.890

## 2015-12-05 HISTORY — DX: Hypothyroidism, unspecified: E03.9

## 2015-12-05 HISTORY — DX: Nausea with vomiting, unspecified: R11.2

## 2015-12-05 HISTORY — DX: Fatty (change of) liver, not elsewhere classified: K76.0

## 2015-12-05 LAB — CBC WITH DIFFERENTIAL/PLATELET
BASOS ABS: 0.1 10*3/uL (ref 0.0–0.1)
Basophils Relative: 1 %
EOS PCT: 4 %
Eosinophils Absolute: 0.3 10*3/uL (ref 0.0–0.7)
HEMATOCRIT: 43.4 % (ref 36.0–46.0)
Hemoglobin: 15 g/dL (ref 12.0–15.0)
LYMPHS PCT: 32 %
Lymphs Abs: 2.1 10*3/uL (ref 0.7–4.0)
MCH: 30.8 pg (ref 26.0–34.0)
MCHC: 34.6 g/dL (ref 30.0–36.0)
MCV: 89.1 fL (ref 78.0–100.0)
Monocytes Absolute: 0.7 10*3/uL (ref 0.1–1.0)
Monocytes Relative: 10 %
NEUTROS ABS: 3.6 10*3/uL (ref 1.7–7.7)
NEUTROS PCT: 53 %
Platelets: 171 10*3/uL (ref 150–400)
RBC: 4.87 MIL/uL (ref 3.87–5.11)
RDW: 12.9 % (ref 11.5–15.5)
WBC: 6.7 10*3/uL (ref 4.0–10.5)

## 2015-12-05 LAB — COMPREHENSIVE METABOLIC PANEL
ALT: 49 U/L (ref 14–54)
AST: 34 U/L (ref 15–41)
Albumin: 4.2 g/dL (ref 3.5–5.0)
Alkaline Phosphatase: 53 U/L (ref 38–126)
Anion gap: 8 (ref 5–15)
BILIRUBIN TOTAL: 0.4 mg/dL (ref 0.3–1.2)
BUN: 12 mg/dL (ref 6–20)
CHLORIDE: 105 mmol/L (ref 101–111)
CO2: 27 mmol/L (ref 22–32)
CREATININE: 0.65 mg/dL (ref 0.44–1.00)
Calcium: 10.1 mg/dL (ref 8.9–10.3)
Glucose, Bld: 162 mg/dL — ABNORMAL HIGH (ref 65–99)
Potassium: 3.8 mmol/L (ref 3.5–5.1)
Sodium: 140 mmol/L (ref 135–145)
TOTAL PROTEIN: 6.6 g/dL (ref 6.5–8.1)

## 2015-12-05 LAB — ABO/RH: ABO/RH(D): B POS

## 2015-12-05 LAB — TYPE AND SCREEN
ABO/RH(D): B POS
Antibody Screen: NEGATIVE

## 2015-12-05 LAB — HEMOGLOBIN A1C
HEMOGLOBIN A1C: 7 % — AB (ref 4.8–5.6)
MEAN PLASMA GLUCOSE: 154 mg/dL

## 2015-12-05 LAB — SURGICAL PCR SCREEN
MRSA, PCR: NEGATIVE
STAPHYLOCOCCUS AUREUS: NEGATIVE

## 2015-12-05 LAB — GLUCOSE, CAPILLARY: GLUCOSE-CAPILLARY: 195 mg/dL — AB (ref 65–99)

## 2015-12-05 LAB — PROTIME-INR
INR: 0.99
PROTHROMBIN TIME: 13.1 s (ref 11.4–15.2)

## 2015-12-05 NOTE — Pre-Procedure Instructions (Signed)
Jessica Cole  12/05/2015      [facility] [phone] [place], [place] - [address] [place] [address] Phone: [phone] Fax: [phone]  [facility] [phone] [place], [place] - [address] [place] [address] Phone: [phone] Fax: [phone]    Your procedure is scheduled on  Thursday  12/13/15  Report to [facility] Admitting at 730 A.M.  Call this number if you have problems the morning of surgery:  [phone]   Remember:  Do not eat food or drink liquids after midnight.  Take these medicines the morning of surgery with A SIP OF WATER   DULOXETINE, METOPROLOL/ TOPROL  , GABAPENTIN, LEVOTHYROXINE, OXYCODONE (STOP ASPIRIN, COQ10, DICLOFENAC/ VOLTAREN, FISH OIL,  RED YEAST RICE)    How to Manage Your Diabetes Before and After Surgery  Why is it important to control my blood sugar before and after surgery? . Improving blood sugar levels before and after surgery helps healing and can limit problems. . A way of improving blood sugar control is eating a healthy diet by: o  Eating less sugar and carbohydrates o  Increasing activity/exercise o  Talking with your doctor about reaching your blood sugar goals . High blood sugars (greater than 180 mg/dL) can raise your risk of infections and slow your recovery, so you will need to focus on controlling your diabetes during the weeks before surgery. . Make sure that the doctor who takes care of your diabetes knows about your planned surgery including the date and location.  How do I manage my blood sugar before surgery? . Check your blood sugar at least 4 times a day, starting 2 days before surgery, to make sure that the level is not too high or low. o Check your blood sugar the morning of your surgery when you wake up and every 2 hours until you get to the Short Stay unit. . If your blood sugar is less than 70 mg/dL, you will need to treat for low blood  sugar: o Do not take insulin. o Treat a low blood sugar (less than 70 mg/dL) with  cup of clear juice (cranberry or apple), 4 glucose tablets, OR glucose gel. o Recheck blood sugar in 15 minutes after treatment (to make sure it is greater than 70 mg/dL). If your blood sugar is not greater than 70 mg/dL on recheck, call [phone] for further instructions. . Report your blood sugar to the short stay nurse when you get to Short Stay.  . If you are admitted to the hospital after surgery: o Your blood sugar will be checked by the staff and you will probably be given insulin after surgery (instead of oral diabetes medicines) to make sure you have good blood sugar levels. o The goal for blood sugar control after surgery is 80-180 mg/dL.              WHAT DO I DO ABOUT MY DIABETES MEDICATION?   Marland Kitchen Do not take oral diabetes medicines (pills) the morning of surgery.       Other Instructions:          Patient Signature:  Date:   Nurse Signature:  Date:   Reviewed and Endorsed by [facility] Patient Education Committee, August 2015  Do not wear jewelry, make-up or nail polish.  Do not wear lotions, powders, or perfumes.  You may wear deoderant.  Do not shave 48 hours prior to  surgery.  Men may shave face and neck.  Do not bring valuables to the hospital.  [facility] is not responsible for any belongings or valuables.  Contacts, dentures or bridgework may not be worn into surgery.  Leave your suitcase in the car.  After surgery it may be brought to your room.  For patients admitted to the hospital, discharge time will be determined by your treatment team.  Patients discharged the day of surgery will not be allowed to drive home.   Name and phone number of your driver:   Special instructions:  [facility] - Preparing for Surgery  Before surgery, you can play an important role.  Because skin is not sterile, your skin needs to be as free of germs as possible.  You can  reduce the number of germs on you skin by washing with CHG (chlorahexidine gluconate) soap before surgery.  CHG is an antiseptic cleaner which kills germs and bonds with the skin to continue killing germs even after washing.  Please DO NOT use if you have an allergy to CHG or antibacterial soaps.  If your skin becomes reddened/irritated stop using the CHG and inform your nurse when you arrive at Short Stay.  Do not shave (including legs and underarms) for at least 48 hours prior to the first CHG shower.  You may shave your face.  Please follow these instructions carefully:   1.  Shower with CHG Soap the night before surgery and the                                morning of Surgery.  2.  If you choose to wash your hair, wash your hair first as usual with your       normal shampoo.  3.  After you shampoo, rinse your hair and body thoroughly to remove the                      Shampoo.  4.  Use CHG as you would any other liquid soap.  You can apply chg directly       to the skin and wash gently with scrungie or a clean washcloth.  5.  Apply the CHG Soap to your body ONLY FROM THE NECK DOWN.        Do not use on open wounds or open sores.  Avoid contact with your eyes,       ears, mouth and genitals (private parts).  Wash genitals (private parts)       with your normal soap.  6.  Wash thoroughly, paying special attention to the area where your surgery        will be performed.  7.  Thoroughly rinse your body with warm water from the neck down.  8.  DO NOT shower/wash with your normal soap after using and rinsing off       the CHG Soap.  9.  Pat yourself dry with a clean towel.            10.  Wear clean pajamas.            11.  Place clean sheets on your bed the night of your first shower and do not        sleep with pets.  Day of Surgery  Do not apply any lotions/deoderants the morning of surgery.  Please wear clean clothes to the hospital/surgery center.  Please read over the following  fact sheets that you were given. Pain Booklet, Blood Transfusion Information, MRSA Information and Surgical Site Infection Prevention

## 2015-12-05 NOTE — Progress Notes (Signed)
REQUESTED STRESS TEST, EKG, ? ECHO, OV FROM DR. CLINE  [facility] IN [place].

## 2015-12-10 NOTE — Telephone Encounter (Signed)
Close encounter 

## 2015-12-12 MED ORDER — CEFAZOLIN SODIUM-DEXTROSE 2-4 GM/100ML-% IV SOLN
2.0000 g | INTRAVENOUS | Status: AC
  Administered 2015-12-13: 2 g via INTRAVENOUS
  Filled 2015-12-12: qty 100

## 2015-12-13 ENCOUNTER — Inpatient Hospital Stay (HOSPITAL_COMMUNITY): Payer: Medicare HMO | Admitting: Critical Care Medicine

## 2015-12-13 ENCOUNTER — Inpatient Hospital Stay (HOSPITAL_COMMUNITY): Payer: Medicare HMO

## 2015-12-13 ENCOUNTER — Inpatient Hospital Stay (HOSPITAL_COMMUNITY)
Admission: RE | Admit: 2015-12-13 | Discharge: 2015-12-16 | DRG: 460 | Disposition: A | Discharge status: Home Health | Payer: Medicare HMO | Source: Ambulatory Visit | Attending: Neurological Surgery | Admitting: Neurological Surgery

## 2015-12-13 ENCOUNTER — Ambulatory Visit: Payer: Medicare HMO | Admitting: Adult Health

## 2015-12-13 ENCOUNTER — Encounter (HOSPITAL_COMMUNITY): Payer: Self-pay | Admitting: *Deleted

## 2015-12-13 ENCOUNTER — Encounter (HOSPITAL_COMMUNITY)
Admission: RE | Disposition: A | Discharge status: Home Health | Payer: Self-pay | Source: Ambulatory Visit | Attending: Neurological Surgery

## 2015-12-13 DIAGNOSIS — Z7982 Long term (current) use of aspirin: Secondary | ICD-10-CM

## 2015-12-13 DIAGNOSIS — E669 Obesity, unspecified: Secondary | ICD-10-CM | POA: Diagnosis present

## 2015-12-13 DIAGNOSIS — Z6837 Body mass index (BMI) 37.0-37.9, adult: Secondary | ICD-10-CM | POA: Diagnosis not present

## 2015-12-13 DIAGNOSIS — H409 Unspecified glaucoma: Secondary | ICD-10-CM | POA: Diagnosis present

## 2015-12-13 DIAGNOSIS — Z7951 Long term (current) use of inhaled steroids: Secondary | ICD-10-CM | POA: Diagnosis not present

## 2015-12-13 DIAGNOSIS — Z7984 Long term (current) use of oral hypoglycemic drugs: Secondary | ICD-10-CM

## 2015-12-13 DIAGNOSIS — M4800 Spinal stenosis, site unspecified: Secondary | ICD-10-CM | POA: Diagnosis present

## 2015-12-13 DIAGNOSIS — Z79899 Other long term (current) drug therapy: Secondary | ICD-10-CM

## 2015-12-13 DIAGNOSIS — Z419 Encounter for procedure for purposes other than remedying health state, unspecified: Secondary | ICD-10-CM

## 2015-12-13 DIAGNOSIS — G2581 Restless legs syndrome: Secondary | ICD-10-CM | POA: Diagnosis present

## 2015-12-13 DIAGNOSIS — E119 Type 2 diabetes mellitus without complications: Secondary | ICD-10-CM | POA: Diagnosis present

## 2015-12-13 DIAGNOSIS — M549 Dorsalgia, unspecified: Secondary | ICD-10-CM | POA: Diagnosis present

## 2015-12-13 DIAGNOSIS — I10 Essential (primary) hypertension: Secondary | ICD-10-CM | POA: Diagnosis present

## 2015-12-13 DIAGNOSIS — G4733 Obstructive sleep apnea (adult) (pediatric): Secondary | ICD-10-CM | POA: Diagnosis present

## 2015-12-13 DIAGNOSIS — M47816 Spondylosis without myelopathy or radiculopathy, lumbar region: Principal | ICD-10-CM | POA: Diagnosis present

## 2015-12-13 DIAGNOSIS — E039 Hypothyroidism, unspecified: Secondary | ICD-10-CM | POA: Diagnosis present

## 2015-12-13 DIAGNOSIS — Z981 Arthrodesis status: Secondary | ICD-10-CM

## 2015-12-13 DIAGNOSIS — M5136 Other intervertebral disc degeneration, lumbar region: Secondary | ICD-10-CM | POA: Diagnosis present

## 2015-12-13 HISTORY — PX: MAXIMUM ACCESS (MAS)POSTERIOR LUMBAR INTERBODY FUSION (PLIF) 2 LEVEL: SHX6369

## 2015-12-13 LAB — GLUCOSE, CAPILLARY
GLUCOSE-CAPILLARY: 166 mg/dL — AB (ref 65–99)
Glucose-Capillary: 189 mg/dL — ABNORMAL HIGH (ref 65–99)
Glucose-Capillary: 194 mg/dL — ABNORMAL HIGH (ref 65–99)

## 2015-12-13 SURGERY — FOR MAXIMUM ACCESS (MAS) POSTERIOR LUMBAR INTERBODY FUSION (PLIF) 2 LEVEL
Anesthesia: General | Site: Back

## 2015-12-13 MED ORDER — MENTHOL 3 MG MT LOZG: 1.0000 | LOZENGE | OROMUCOSAL | Status: DC | PRN

## 2015-12-13 MED ORDER — THROMBIN 20000 UNITS EX KIT: PACK | CUTANEOUS | Status: DC

## 2015-12-13 MED ORDER — PHENOL 1.4 % MT LIQD
1.0000 | OROMUCOSAL | Status: DC | PRN
  Administered 2015-12-16: 1 via OROMUCOSAL
  Filled 2015-12-13: qty 177

## 2015-12-13 MED ORDER — ONDANSETRON HCL 4 MG/2ML IJ SOLN
INTRAMUSCULAR | Status: DC | PRN
  Administered 2015-12-13: 4 mg via INTRAVENOUS

## 2015-12-13 MED ORDER — METFORMIN HCL 500 MG PO TABS
500.0000 mg | ORAL_TABLET | Freq: Two times a day (BID) | ORAL | Status: DC
  Administered 2015-12-14 – 2015-12-16 (×5): 500 mg via ORAL
  Filled 2015-12-13 (×5): qty 1

## 2015-12-13 MED ORDER — KETOROLAC TROMETHAMINE 30 MG/ML IJ SOLN: 30.0000 mg | Freq: Once | INTRAMUSCULAR | Status: DC

## 2015-12-13 MED ORDER — LACTATED RINGERS IV SOLN
INTRAVENOUS | Status: DC
  Administered 2015-12-13 (×2): via INTRAVENOUS

## 2015-12-13 MED ORDER — GABAPENTIN 300 MG PO CAPS
600.0000 mg | ORAL_CAPSULE | Freq: Two times a day (BID) | ORAL | Status: DC
  Administered 2015-12-13 – 2015-12-16 (×6): 600 mg via ORAL
  Filled 2015-12-13 (×6): qty 2

## 2015-12-13 MED ORDER — ONDANSETRON HCL 4 MG/2ML IJ SOLN
INTRAMUSCULAR | Status: AC
  Filled 2015-12-13: qty 2

## 2015-12-13 MED ORDER — PHENYLEPHRINE 40 MCG/ML (10ML) SYRINGE FOR IV PUSH (FOR BLOOD PRESSURE SUPPORT)
PREFILLED_SYRINGE | INTRAVENOUS | Status: AC
  Filled 2015-12-13: qty 10

## 2015-12-13 MED ORDER — HYDROMORPHONE HCL 1 MG/ML IJ SOLN
0.2500 mg | INTRAMUSCULAR | Status: DC | PRN
  Administered 2015-12-13 (×4): 0.5 mg via INTRAVENOUS

## 2015-12-13 MED ORDER — ESTRADIOL 0.05 MG/24HR TD PTWK: 0.0500 mg | MEDICATED_PATCH | TRANSDERMAL | Status: DC

## 2015-12-13 MED ORDER — DOCUSATE SODIUM 100 MG PO CAPS
100.0000 mg | ORAL_CAPSULE | Freq: Two times a day (BID) | ORAL | Status: DC
  Administered 2015-12-13 – 2015-12-16 (×6): 100 mg via ORAL
  Filled 2015-12-13 (×7): qty 1

## 2015-12-13 MED ORDER — SUCCINYLCHOLINE CHLORIDE 200 MG/10ML IV SOSY
PREFILLED_SYRINGE | INTRAVENOUS | Status: DC | PRN
  Administered 2015-12-13: 120 mg via INTRAVENOUS

## 2015-12-13 MED ORDER — FENTANYL CITRATE (PF) 250 MCG/5ML IJ SOLN
INTRAMUSCULAR | Status: AC
  Filled 2015-12-13: qty 5

## 2015-12-13 MED ORDER — ACETAMINOPHEN 10 MG/ML IV SOLN
INTRAVENOUS | Status: DC | PRN
  Administered 2015-12-13: 1000 mg via INTRAVENOUS

## 2015-12-13 MED ORDER — THROMBIN 20000 UNITS EX SOLR
CUTANEOUS | Status: DC | PRN
  Administered 2015-12-13: 20 mL via TOPICAL

## 2015-12-13 MED ORDER — ACETAMINOPHEN 10 MG/ML IV SOLN
INTRAVENOUS | Status: AC
  Filled 2015-12-13: qty 100

## 2015-12-13 MED ORDER — DEXAMETHASONE SODIUM PHOSPHATE 10 MG/ML IJ SOLN
INTRAMUSCULAR | Status: AC
  Filled 2015-12-13: qty 1

## 2015-12-13 MED ORDER — ACETAMINOPHEN 650 MG RE SUPP: 650.0000 mg | RECTAL | Status: DC | PRN

## 2015-12-13 MED ORDER — METHOCARBAMOL 1000 MG/10ML IJ SOLN
500.0000 mg | Freq: Four times a day (QID) | INTRAVENOUS | Status: DC | PRN
  Administered 2015-12-13: 500 mg via INTRAVENOUS
  Filled 2015-12-13 (×3): qty 5

## 2015-12-13 MED ORDER — MIDAZOLAM HCL 2 MG/2ML IJ SOLN
INTRAMUSCULAR | Status: AC
  Filled 2015-12-13: qty 2

## 2015-12-13 MED ORDER — THROMBIN 5000 UNITS EX SOLR
OROMUCOSAL | Status: DC | PRN
  Administered 2015-12-13: 5 mL via TOPICAL

## 2015-12-13 MED ORDER — LEVOTHYROXINE SODIUM 112 MCG PO TABS
112.0000 ug | ORAL_TABLET | Freq: Every day | ORAL | Status: DC
  Administered 2015-12-14 – 2015-12-16 (×3): 112 ug via ORAL
  Filled 2015-12-13 (×3): qty 1

## 2015-12-13 MED ORDER — MORPHINE SULFATE (PF) 2 MG/ML IV SOLN
1.0000 mg | INTRAVENOUS | Status: DC | PRN
  Administered 2015-12-13 – 2015-12-14 (×5): 2 mg via INTRAVENOUS
  Administered 2015-12-15: 4 mg via INTRAVENOUS
  Administered 2015-12-15: 2 mg via INTRAVENOUS
  Administered 2015-12-15: 4 mg via INTRAVENOUS
  Administered 2015-12-15: 2 mg via INTRAVENOUS
  Administered 2015-12-15: 4 mg via INTRAVENOUS
  Filled 2015-12-13: qty 1
  Filled 2015-12-13 (×2): qty 2
  Filled 2015-12-13 (×3): qty 1
  Filled 2015-12-13: qty 2
  Filled 2015-12-13 (×4): qty 1

## 2015-12-13 MED ORDER — LACTATED RINGERS IV SOLN: INTRAVENOUS | Status: DC

## 2015-12-13 MED ORDER — CELECOXIB 200 MG PO CAPS
200.0000 mg | ORAL_CAPSULE | Freq: Two times a day (BID) | ORAL | Status: DC
  Administered 2015-12-13 – 2015-12-16 (×6): 200 mg via ORAL
  Filled 2015-12-13 (×6): qty 1

## 2015-12-13 MED ORDER — LATANOPROST 0.005 % OP SOLN
1.0000 [drp] | Freq: Every day | OPHTHALMIC | Status: DC
  Administered 2015-12-13 – 2015-12-15 (×3): 1 [drp] via OPHTHALMIC
  Filled 2015-12-13: qty 2.5

## 2015-12-13 MED ORDER — SUCCINYLCHOLINE CHLORIDE 200 MG/10ML IV SOSY
PREFILLED_SYRINGE | INTRAVENOUS | Status: AC
  Filled 2015-12-13: qty 10

## 2015-12-13 MED ORDER — ACETAMINOPHEN 10 MG/ML IV SOLN
1000.0000 mg | Freq: Once | INTRAVENOUS | Status: AC
  Filled 2015-12-13: qty 100

## 2015-12-13 MED ORDER — METOPROLOL TARTRATE 12.5 MG HALF TABLET
ORAL_TABLET | ORAL | Status: AC
  Filled 2015-12-13: qty 2

## 2015-12-13 MED ORDER — PHENYLEPHRINE HCL 10 MG/ML IJ SOLN
INTRAMUSCULAR | Status: DC | PRN
  Administered 2015-12-13: 25 ug/min via INTRAVENOUS

## 2015-12-13 MED ORDER — BUPIVACAINE HCL (PF) 0.25 % IJ SOLN
INTRAMUSCULAR | Status: DC | PRN
  Administered 2015-12-13: 5 mL

## 2015-12-13 MED ORDER — SODIUM CHLORIDE 0.9% FLUSH: 3.0000 mL | INTRAVENOUS | Status: DC | PRN

## 2015-12-13 MED ORDER — PROPOFOL 10 MG/ML IV BOLUS
INTRAVENOUS | Status: AC
  Filled 2015-12-13: qty 20

## 2015-12-13 MED ORDER — POTASSIUM CHLORIDE IN NACL 20-0.9 MEQ/L-% IV SOLN
INTRAVENOUS | Status: DC
  Administered 2015-12-13 – 2015-12-14 (×2): via INTRAVENOUS
  Filled 2015-12-13 (×3): qty 1000

## 2015-12-13 MED ORDER — ACETAMINOPHEN 325 MG PO TABS
650.0000 mg | ORAL_TABLET | ORAL | Status: DC | PRN
  Administered 2015-12-14 – 2015-12-16 (×3): 650 mg via ORAL
  Filled 2015-12-13 (×4): qty 2

## 2015-12-13 MED ORDER — SCOPOLAMINE 1 MG/3DAYS TD PT72
MEDICATED_PATCH | TRANSDERMAL | Status: DC | PRN
  Administered 2015-12-13: 1 via TRANSDERMAL

## 2015-12-13 MED ORDER — BUSPIRONE HCL 10 MG PO TABS: 15.0000 mg | ORAL_TABLET | Freq: Two times a day (BID) | ORAL | Status: DC | PRN

## 2015-12-13 MED ORDER — KETOROLAC TROMETHAMINE 30 MG/ML IJ SOLN
INTRAMUSCULAR | Status: AC
  Administered 2015-12-13: 30 mg
  Filled 2015-12-13: qty 1

## 2015-12-13 MED ORDER — PROMETHAZINE HCL 25 MG/ML IJ SOLN: 6.2500 mg | INTRAMUSCULAR | Status: DC | PRN

## 2015-12-13 MED ORDER — METOPROLOL TARTRATE 25 MG PO TABS
25.0000 mg | ORAL_TABLET | Freq: Once | ORAL | Status: DC
Start: 2015-12-13 — End: 2015-12-13
  Filled 2015-12-13: qty 1

## 2015-12-13 MED ORDER — OXYCODONE HCL 5 MG PO TABS
ORAL_TABLET | ORAL | Status: AC
  Filled 2015-12-13: qty 2

## 2015-12-13 MED ORDER — ONDANSETRON HCL 4 MG/2ML IJ SOLN: 4.0000 mg | INTRAMUSCULAR | Status: DC | PRN

## 2015-12-13 MED ORDER — LIDOCAINE HCL (CARDIAC) 20 MG/ML IV SOLN
INTRAVENOUS | Status: DC | PRN
  Administered 2015-12-13: 80 mg via INTRAVENOUS

## 2015-12-13 MED ORDER — PROPOFOL 1000 MG/100ML IV EMUL
INTRAVENOUS | Status: AC
  Filled 2015-12-13: qty 200

## 2015-12-13 MED ORDER — PROPOFOL 10 MG/ML IV BOLUS
INTRAVENOUS | Status: DC | PRN
  Administered 2015-12-13: 120 mg via INTRAVENOUS
  Administered 2015-12-13 (×2): 40 mg via INTRAVENOUS

## 2015-12-13 MED ORDER — ROCURONIUM BROMIDE 10 MG/ML (PF) SYRINGE
PREFILLED_SYRINGE | INTRAVENOUS | Status: AC
  Filled 2015-12-13: qty 10

## 2015-12-13 MED ORDER — CHLORHEXIDINE GLUCONATE CLOTH 2 % EX PADS: 6.0000 | MEDICATED_PAD | Freq: Once | CUTANEOUS | Status: DC

## 2015-12-13 MED ORDER — ARTIFICIAL TEARS OP OINT
TOPICAL_OINTMENT | OPHTHALMIC | Status: DC | PRN
Start: 2015-12-13 — End: 2015-12-13
  Administered 2015-12-13: 1 via OPHTHALMIC

## 2015-12-13 MED ORDER — MEPERIDINE HCL 25 MG/ML IJ SOLN: 6.2500 mg | INTRAMUSCULAR | Status: DC | PRN

## 2015-12-13 MED ORDER — METHOCARBAMOL 500 MG PO TABS
500.0000 mg | ORAL_TABLET | Freq: Four times a day (QID) | ORAL | Status: DC | PRN
  Administered 2015-12-13 – 2015-12-16 (×7): 500 mg via ORAL
  Filled 2015-12-13 (×8): qty 1

## 2015-12-13 MED ORDER — GLYCOPYRROLATE 0.2 MG/ML IJ SOLN
INTRAMUSCULAR | Status: DC | PRN
  Administered 2015-12-13: .2 mg via INTRAVENOUS

## 2015-12-13 MED ORDER — SODIUM CHLORIDE 0.9% FLUSH
3.0000 mL | Freq: Two times a day (BID) | INTRAVENOUS | Status: DC
  Administered 2015-12-13 – 2015-12-14 (×2): 3 mL via INTRAVENOUS
  Administered 2015-12-15: 23:00:00 via INTRAVENOUS
  Administered 2015-12-15 – 2015-12-16 (×2): 3 mL via INTRAVENOUS

## 2015-12-13 MED ORDER — HYDROMORPHONE HCL 1 MG/ML IJ SOLN
INTRAMUSCULAR | Status: AC
  Filled 2015-12-13: qty 1

## 2015-12-13 MED ORDER — LIDOCAINE 2% (20 MG/ML) 5 ML SYRINGE
INTRAMUSCULAR | Status: AC
  Filled 2015-12-13: qty 5

## 2015-12-13 MED ORDER — VANCOMYCIN HCL 1000 MG IV SOLR
INTRAVENOUS | Status: AC
  Filled 2015-12-13: qty 1000

## 2015-12-13 MED ORDER — PHENYLEPHRINE HCL 10 MG/ML IJ SOLN
INTRAMUSCULAR | Status: DC | PRN
  Administered 2015-12-13: 120 ug via INTRAVENOUS
  Administered 2015-12-13: 80 ug via INTRAVENOUS
  Administered 2015-12-13: 40 ug via INTRAVENOUS
  Administered 2015-12-13: 80 ug via INTRAVENOUS

## 2015-12-13 MED ORDER — VANCOMYCIN HCL 1000 MG IV SOLR
INTRAVENOUS | Status: DC | PRN
  Administered 2015-12-13: 1000 mg via TOPICAL

## 2015-12-13 MED ORDER — SODIUM CHLORIDE 0.9 % IR SOLN
Status: DC | PRN
  Administered 2015-12-13: 500 mL

## 2015-12-13 MED ORDER — FENTANYL CITRATE (PF) 100 MCG/2ML IJ SOLN
INTRAMUSCULAR | Status: DC | PRN
  Administered 2015-12-13: 50 ug via INTRAVENOUS
  Administered 2015-12-13: 75 ug via INTRAVENOUS
  Administered 2015-12-13 (×2): 50 ug via INTRAVENOUS
  Administered 2015-12-13 (×2): 25 ug via INTRAVENOUS
  Administered 2015-12-13: 75 ug via INTRAVENOUS

## 2015-12-13 MED ORDER — CEFAZOLIN SODIUM-DEXTROSE 2-4 GM/100ML-% IV SOLN
2.0000 g | Freq: Three times a day (TID) | INTRAVENOUS | Status: AC
  Administered 2015-12-13 – 2015-12-14 (×2): 2 g via INTRAVENOUS
  Filled 2015-12-13 (×2): qty 100

## 2015-12-13 MED ORDER — MIDAZOLAM HCL 5 MG/5ML IJ SOLN
INTRAMUSCULAR | Status: DC | PRN
  Administered 2015-12-13: 1 mg via INTRAVENOUS

## 2015-12-13 MED ORDER — DULOXETINE HCL 60 MG PO CPEP
60.0000 mg | ORAL_CAPSULE | Freq: Every day | ORAL | Status: DC
  Administered 2015-12-14 – 2015-12-16 (×3): 60 mg via ORAL
  Filled 2015-12-13 (×3): qty 1

## 2015-12-13 MED ORDER — SODIUM CHLORIDE 0.9 % IV SOLN: 250.0000 mL | INTRAVENOUS | Status: DC

## 2015-12-13 MED ORDER — DEXAMETHASONE SODIUM PHOSPHATE 10 MG/ML IJ SOLN
INTRAMUSCULAR | Status: DC | PRN
Start: 2015-12-13 — End: 2015-12-13
  Administered 2015-12-13: 4 mg via INTRAVENOUS

## 2015-12-13 MED ORDER — ENALAPRIL MALEATE 10 MG PO TABS
10.0000 mg | ORAL_TABLET | Freq: Every day | ORAL | Status: DC
  Administered 2015-12-14 – 2015-12-16 (×3): 10 mg via ORAL
  Filled 2015-12-13 (×3): qty 1

## 2015-12-13 MED ORDER — METOPROLOL SUCCINATE ER 25 MG PO TB24
25.0000 mg | ORAL_TABLET | Freq: Every day | ORAL | Status: DC
  Administered 2015-12-13 – 2015-12-16 (×3): 25 mg via ORAL
  Filled 2015-12-13 (×4): qty 1

## 2015-12-13 MED ORDER — AZELASTINE HCL 0.1 % NA SOLN
2.0000 | Freq: Two times a day (BID) | NASAL | Status: DC
  Administered 2015-12-13 – 2015-12-16 (×6): 2 via NASAL
  Filled 2015-12-13: qty 30

## 2015-12-13 MED ORDER — ASPIRIN EC 81 MG PO TBEC
81.0000 mg | DELAYED_RELEASE_TABLET | ORAL | Status: DC
  Administered 2015-12-15: 81 mg via ORAL
  Filled 2015-12-13: qty 1

## 2015-12-13 MED ORDER — OXYCODONE HCL 5 MG PO TABS
10.0000 mg | ORAL_TABLET | ORAL | Status: DC | PRN
  Administered 2015-12-13 – 2015-12-16 (×13): 10 mg via ORAL
  Filled 2015-12-13 (×12): qty 2

## 2015-12-13 MED ORDER — 0.9 % SODIUM CHLORIDE (POUR BTL) OPTIME
TOPICAL | Status: DC | PRN
Start: 2015-12-13 — End: 2015-12-13
  Administered 2015-12-13: 1000 mL

## 2015-12-13 SURGICAL SUPPLY — 64 items
BAG DECANTER FOR FLEXI CONT (MISCELLANEOUS) ×3 IMPLANT
BENZOIN TINCTURE PRP APPL 2/3 (GAUZE/BANDAGES/DRESSINGS) ×3 IMPLANT
BIT DRILL PLIF MAS 5.0MM DISP (DRILL) ×1 IMPLANT
BLADE CLIPPER SURG (BLADE) IMPLANT
BONE MATRIX OSTEOCEL PRO MED (Bone Implant) ×3 IMPLANT
BUR MATCHSTICK NEURO 3.0 LAGG (BURR) ×3 IMPLANT
CAGE COROENT 9X9X23-4 (Cage) ×6 IMPLANT
CAGE COROENT MP 8X23 (Cage) ×6 IMPLANT
CANISTER SUCT 3000ML PPV (MISCELLANEOUS) ×3 IMPLANT
CAP RELINE MOD TULIP RMM (Cap) ×6 IMPLANT
CLIP NEUROVISION LG (CLIP) ×3 IMPLANT
CLOSURE WOUND 1/2 X4 (GAUZE/BANDAGES/DRESSINGS) ×2
CONT SPEC 4OZ CLIKSEAL STRL BL (MISCELLANEOUS) ×3 IMPLANT
COVER BACK TABLE 24X17X13 BIG (DRAPES) IMPLANT
COVER BACK TABLE 60X90IN (DRAPES) ×3 IMPLANT
DERMABOND ADVANCED (GAUZE/BANDAGES/DRESSINGS) ×2
DERMABOND ADVANCED .7 DNX12 (GAUZE/BANDAGES/DRESSINGS) ×1 IMPLANT
DRAPE C-ARM 42X72 X-RAY (DRAPES) ×3 IMPLANT
DRAPE C-ARMOR (DRAPES) ×3 IMPLANT
DRAPE LAPAROTOMY 100X72X124 (DRAPES) ×3 IMPLANT
DRAPE POUCH INSTRU U-SHP 10X18 (DRAPES) ×3 IMPLANT
DRAPE SURG 17X23 STRL (DRAPES) ×3 IMPLANT
DRILL PLIF MAS 5.0MM DISP (DRILL) ×3
DRSG OPSITE POSTOP 4X6 (GAUZE/BANDAGES/DRESSINGS) ×3 IMPLANT
DURAPREP 26ML APPLICATOR (WOUND CARE) ×3 IMPLANT
ELECT REM PT RETURN 9FT ADLT (ELECTROSURGICAL) ×3
ELECTRODE REM PT RTRN 9FT ADLT (ELECTROSURGICAL) ×1 IMPLANT
EVACUATOR 1/8 PVC DRAIN (DRAIN) ×3 IMPLANT
GAUZE SPONGE 4X4 16PLY XRAY LF (GAUZE/BANDAGES/DRESSINGS) IMPLANT
GLOVE BIO SURGEON STRL SZ8 (GLOVE) ×6 IMPLANT
GOWN STRL REUS W/ TWL LRG LVL3 (GOWN DISPOSABLE) IMPLANT
GOWN STRL REUS W/ TWL XL LVL3 (GOWN DISPOSABLE) ×2 IMPLANT
GOWN STRL REUS W/TWL 2XL LVL3 (GOWN DISPOSABLE) IMPLANT
GOWN STRL REUS W/TWL LRG LVL3 (GOWN DISPOSABLE)
GOWN STRL REUS W/TWL XL LVL3 (GOWN DISPOSABLE) ×4
HEMOSTAT POWDER KIT SURGIFOAM (HEMOSTASIS) IMPLANT
KIT BASIN OR (CUSTOM PROCEDURE TRAY) ×3 IMPLANT
KIT ROOM TURNOVER OR (KITS) ×3 IMPLANT
MILL MEDIUM DISP (BLADE) ×3 IMPLANT
MODULE NVM5 NEXT GEN EMG (NEEDLE) ×3 IMPLANT
NEEDLE HYPO 25X1 1.5 SAFETY (NEEDLE) ×3 IMPLANT
NS IRRIG 1000ML POUR BTL (IV SOLUTION) ×3 IMPLANT
PACK LAMINECTOMY NEURO (CUSTOM PROCEDURE TRAY) ×3 IMPLANT
PAD ARMBOARD 7.5X6 YLW CONV (MISCELLANEOUS) ×9 IMPLANT
PATTIES SURGICAL 1X1 (DISPOSABLE) ×3 IMPLANT
ROD RELINE COCR LORD 5.0X65 (Rod) ×6 IMPLANT
SCREW LOCK RSS 4.5/5.0MM (Screw) ×18 IMPLANT
SCREW POLY RMM 5.5X40 4S (Screw) ×6 IMPLANT
SCREW RELINE RMM 5.5X35 4S (Screw) ×4 IMPLANT
SCREW SHANK RELINE MOD 5.0X35 (Screw) ×6 IMPLANT
SPONGE LAP 4X18 X RAY DECT (DISPOSABLE) IMPLANT
SPONGE SURGIFOAM ABS GEL 100 (HEMOSTASIS) ×3 IMPLANT
STRIP CLOSURE SKIN 1/2X4 (GAUZE/BANDAGES/DRESSINGS) ×4 IMPLANT
SUT VIC AB 0 CT1 18XCR BRD8 (SUTURE) ×1 IMPLANT
SUT VIC AB 0 CT1 8-18 (SUTURE) ×2
SUT VIC AB 2-0 CP2 18 (SUTURE) ×3 IMPLANT
SUT VIC AB 3-0 SH 8-18 (SUTURE) ×6 IMPLANT
SYR 3ML LL SCALE MARK (SYRINGE) IMPLANT
TAPE STRIPS DRAPE STRL (GAUZE/BANDAGES/DRESSINGS) ×3 IMPLANT
TOWEL OR 17X24 6PK STRL BLUE (TOWEL DISPOSABLE) ×3 IMPLANT
TOWEL OR 17X26 10 PK STRL BLUE (TOWEL DISPOSABLE) ×3 IMPLANT
TRAP SPECIMEN MUCOUS 40CC (MISCELLANEOUS) ×3 IMPLANT
TRAY FOLEY W/METER SILVER 16FR (SET/KITS/TRAYS/PACK) ×3 IMPLANT
WATER STERILE IRR 1000ML POUR (IV SOLUTION) ×3 IMPLANT

## 2015-12-13 NOTE — H&P (Signed)
Subjective: Patient is a 65 y.o. female admitted for severe back pain. Onset of symptoms was several months ago, gradually worsening since that time.  The pain is rated intense, and is located at the across the lower back and radiates to her legs. The pain is described as aching and occurs all day. The symptoms have been progressive. Symptoms are exacerbated by exercise. MRI or CT showed degenerative disc disease L2-L3 for spondylosis   Past Medical History:  Diagnosis Date  . Colon polyps   . Degenerative arthritis   . Diabetes mellitus without complication (HCC)   . Environmental allergies   . Fatty liver   . Glaucoma   . Headache(784.0)    HX  MIGRAINES  . Hypertension   . Hypothyroidism   . Obesity   . OSA on CPAP   . Plantar fasciitis    right  . PONV (postoperative nausea and vomiting)   . Renal calculi   . Renal calculi   . RLS (restless legs syndrome) 08/23/2014    Past Surgical History:  Procedure Laterality Date  . ABDOMINAL HYSTERECTOMY    . APPENDECTOMY    . Arthroscopic surgery, knee Left   . BREAST BIOPSY    . BUNIONECTOMY     LEFT   . COLONOSCOPY W/ POLYPECTOMY    . FOOT SURGERY     RIGHT    . KNEE ARTHROSCOPY W/ MENISCAL REPAIR     RIGHT  . LASIK    . SHOULDER SURGERY     RIGHT   . TONSILLECTOMY      Prior to Admission medications   Medication Sig Start Date End Date Taking? Authorizing Provider  aspirin (ASPIRIN EC) 81 MG EC tablet Take 81 mg by mouth every other day. Swallow whole. In the evening   Yes Historical Provider, MD  azelastine (ASTELIN) 0.1 % nasal spray Place 2 sprays into both nostrils 2 (two) times daily.  11/23/15  Yes Historical Provider, MD  busPIRone (BUSPAR) 15 MG tablet Take 15 mg by mouth 2 (two) times daily as needed (for anxiety).    Yes Historical Provider, MD  Coenzyme Q10 (CO Q 10) 100 MG CAPS Take 100 mg by mouth daily.   Yes Historical Provider, MD  DULoxetine (CYMBALTA) 30 MG capsule Take 60 mg by mouth daily.    Yes  Historical Provider, MD  enalapril (VASOTEC) 10 MG tablet Take 10 mg by mouth daily. 11/23/15  Yes Historical Provider, MD  estradiol (VIVELLE-DOT) 0.05 MG/24HR patch Place 1 patch onto the skin 2 (two) times a week.  11/07/15  Yes Historical Provider, MD  fexofenadine (ALLER-EASE) 180 MG tablet Take 180 mg by mouth daily.   Yes Historical Provider, MD  fluticasone (FLONASE) 50 MCG/ACT nasal spray Place 2 sprays into both nostrils 2 (two) times daily.  04/12/14  Yes Historical Provider, MD  gabapentin (NEURONTIN) 300 MG capsule Take 600 mg by mouth 2 (two) times daily.  06/06/15  Yes Historical Provider, MD  Genistein (I-COOL FOR MENOPAUSE) 30 MG TABS Take 30 mg by mouth every morning.   Yes Historical Provider, MD  latanoprost (XALATAN) 0.005 % ophthalmic solution Place 1 drop into both eyes at bedtime.    Yes Historical Provider, MD  levothyroxine (SYNTHROID, LEVOTHROID) 112 MCG tablet Take 112 mcg by mouth daily before breakfast.  11/07/15  Yes Historical Provider, MD  metFORMIN (GLUCOPHAGE) 500 MG tablet Take 500 mg by mouth 2 (two) times daily with a meal.    Yes Historical Provider, MD  metoprolol succinate (TOPROL-XL) 25 MG 24 hr tablet TAKE ONE (1) TABLET BY MOUTH EVERY DAY Patient taking differently: TAKE ONE (1) TABLET (25 mg) BY MOUTH EVERY DAY 08/30/15  Yes York Spanielharles K Willis, MD  Omega-3 Fatty Acids (FISH OIL ULTRA) 1000 MG CAPS Take 1,000 mg by mouth 2 (two) times daily.    Yes Historical Provider, MD  Oxycodone HCl 10 MG TABS Take 1 tablet (10 mg total) by mouth every 6 (six) hours as needed. Patient taking differently: Take 10 mg by mouth every 4 (four) hours as needed (for pain).  06/19/15  Yes York Spanielharles K Willis, MD  Red Yeast Rice 600 MG CAPS Take 1,200 mg by mouth 2 (two) times daily.   Yes Historical Provider, MD  rOPINIRole (REQUIP) 3 MG tablet TAKE ONE (1) TABLET BY MOUTH TWO (2) TIMES DAILY Patient taking differently: TAKE ONE (1) TABLET (3 mg) BY MOUTH TWO (2) TIMES DAILY 10/09/15  Yes  York Spanielharles K Willis, MD  B Complex Vitamins (VITAMIN B COMPLEX PO) Take 1 tablet by mouth daily.     Historical Provider, MD  diclofenac sodium (VOLTAREN) 1 % GEL Apply 4 g topically 2 (two) times daily. Patient taking differently: Apply 4 g topically daily as needed (for pain).  08/28/14   Max T Hyatt, DPM  traZODone (DESYREL) 100 MG tablet TAKE ONE TO TWO TABLETS BY MOUTH AT BEDTIME FOR SLEEP Patient taking differently: TAKE ONE TO TWO (100 - 200 mg) TABLETS BY MOUTH AT BEDTIME as needed FOR SLEEP 08/07/15   York Spanielharles K Willis, MD   Allergies  Allergen Reactions  . Dexamethasone Other (See Comments)    During a tapered dose, once.  Was shaky (side effect, not allergy).    Social History  Substance Use Topics  . Smoking status: Never Smoker  . Smokeless tobacco: Never Used  . Alcohol use No    Family History  Problem Relation Age of Onset  . Lung cancer Mother   . Congestive Heart Failure Father   . Depression Sister   . Rheum arthritis Sister   . Headache Maternal Grandfather   . Migraines Maternal Grandfather   . Headache Maternal Uncle   . Diabetes    . Heart disease    . Hypertension       Review of Systems  Positive ROS: Negative  All other systems have been reviewed and were otherwise negative with the exception of those mentioned in the HPI and as above.  Objective: Vital signs in last 24 hours: Temp:  [98.2 F (36.8 C)] 98.2 F (36.8 C) (08/10 0810) Pulse Rate:  [63] 63 (08/10 0810) Resp:  [18] 18 (08/10 0810) BP: (130)/(68) 130/68 (08/10 0810) SpO2:  [94 %] 94 % (08/10 0810) Weight:  [87.1 kg (192 lb)] 87.1 kg (192 lb) (08/10 0810)  General Appearance: Alert, cooperative, no distress, appears stated age Head: Normocephalic, without obvious abnormality, atraumatic Eyes: PERRL, conjunctiva/corneas clear, EOM's intact    Neck: Supple, symmetrical, trachea midline Back: Symmetric, no curvature, ROM normal, no CVA tenderness Lungs:  respirations unlabored Heart:  Regular rate and rhythm Abdomen: Soft, non-tender Extremities: Extremities normal, atraumatic, no cyanosis or edema Pulses: 2+ and symmetric all extremities Skin: Skin color, texture, turgor normal, no rashes or lesions  NEUROLOGIC:   Mental status: Alert and oriented x4,  no aphasia, good attention span, fund of knowledge, and memory Motor Exam - grossly normal Sensory Exam - grossly normal Reflexes: 1+ Coordination - grossly normal Gait - grossly normal Balance -  grossly normal Cranial Nerves: I: smell Not tested  II: visual acuity  OS: nl    OD: nl  II: visual fields Full to confrontation  II: pupils Equal, round, reactive to light  III,VII: ptosis None  III,IV,VI: extraocular muscles  Full ROM  V: mastication Normal  V: facial light touch sensation  Normal  V,VII: corneal reflex  Present  VII: facial muscle function - upper  Normal  VII: facial muscle function - lower Normal  VIII: hearing Not tested  IX: soft palate elevation  Normal  IX,X: gag reflex Present  XI: trapezius strength  5/5  XI: sternocleidomastoid strength 5/5  XI: neck flexion strength  5/5  XII: tongue strength  Normal    Data Review Lab Results  Component Value Date   WBC 6.7 12/05/2015   HGB 15.0 12/05/2015   HCT 43.4 12/05/2015   MCV 89.1 12/05/2015   PLT 171 12/05/2015   Lab Results  Component Value Date   NA 140 12/05/2015   K 3.8 12/05/2015   CL 105 12/05/2015   CO2 27 12/05/2015   BUN 12 12/05/2015   CREATININE 0.65 12/05/2015   GLUCOSE 162 (H) 12/05/2015   Lab Results  Component Value Date   INR 0.99 12/05/2015    Assessment/Plan: Patient admitted for PLIF L2-3 L3-4. Patient has failed a reasonable attempt at conservative therapy.  I explained the condition and procedure to the patient and answered any questions.  Patient wishes to proceed with procedure as planned. Understands risks/ benefits and typical outcomes of procedure.   Jag Lenz S 12/13/2015 9:28 AM

## 2015-12-13 NOTE — Op Note (Signed)
12/13/2015  1:32 PM  PATIENT:  Jessica Cole  65 y.o. 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 Decompressive lumbar laminectomy L2-3 and L3-4 requiring more work than would be required for a simple exposure of the disk for PLIF in order to adequately decompress the neural elements and address the spinal stenosis 2. Posterior lumbar interbody fusion L2-3 and L3-4 using PEEK interbody cages packed with morcellized allograft and autograft 3. Posterior fixation L2-L4 inclusive using cortical pedicle screws.  4. Intertransverse arthrodesis L2-L4 using morcellized autograft and allograft.  SURGEON:  Marikay Alaravid Yvonne Stopher, MD  ASSISTANTS: Dr. Lovell SheehanJenkins  ANESTHESIA:  General  EBL: 150 ml  Total I/O In: 1600 [I.V.:1600] Out: 340 [Urine:190; Blood:150]  BLOOD ADMINISTERED:none  DRAINS: Hemovac   INDICATION FOR PROCEDURE: This patient presented with a 14 month history of severe progressive unrelenting back pain. She had spondylosis on MRI and had a positive discogram at L2-3 and L3-4. She tried medical management without relief. I recommended instrument effusion L23 L3-4. Patient understood the risks, benefits, and alternatives and potential outcomes and wished to proceed.  PROCEDURE DETAILS:  The patient was brought to the operating room. After induction of generalized endotracheal anesthesia the patient was rolled into the prone position on chest rolls and all pressure points were padded. The patient's lumbar region was cleaned and then prepped with DuraPrep and draped in the usual sterile fashion. Anesthesia was injected and then a dorsal midline incision was made and carried down to the lumbosacral fascia. The fascia was opened and the paraspinous musculature was taken down in a subperiosteal fashion to expose L2-3 and L3-4. A self-retaining retractor was placed.  Intraoperative fluoroscopy confirmed my level, and I started with placement of the L2 cortical pedicle screws. The pedicle screw entry zones were identified utilizing surface landmarks and  AP and lateral fluoroscopy. I scored the cortex with the high-speed drill and then used the hand drill and EMG monitoring to drill an upward and outward direction into the pedicle. I then tapped line to line, and the tap was also monitored. I then placed a 5-0 x 35 mm cortical pedicle screw into the pedicles of L2 bilaterally. I then turned my attention to the decompression and lumbar laminectomies, hemi- facetectomies, and foraminotomies were performed at L2-3 and L3-4. The patient had significant spinal stenosis and this required more work than would be required for a simple exposure of the disc for posterior lumbar interbody fusion. Much more generous decompression was undertaken in order to adequately decompress the neural elements and address the patient's leg pain. The yellow ligament was removed to expose the underlying dura and nerve roots, and generous foraminotomies were performed to adequately decompress the neural elements. Both the exiting and traversing nerve roots were decompressed on both sides until a coronary dilator passed easily along the nerve roots. The exact same decompression was performed at both levels. Once the decompression was complete, I turned my attention to the posterior lower lumbar interbody fusion. The epidural venous vasculature was coagulated and cut sharply. Disc space was incised at both levels and the initial discectomy was performed with pituitary rongeurs. The disc space was distracted with sequential distractors to a height of 9 mm. We then used a series of scrapers and shavers to prepare the endplates for fusion. The midline was prepared with Epstein curettes. Once the complete discectomy was finished, we  packed an appropriate sized peek interbody cage with local autograft and  morcellized allograft, gently retracted the nerve root, and tapped the cage into position at L2-3 and L3-4 bilaterally.  The midline between the cages was packed with morselized autograft and allograft. We then turned our attention to the placement of the lower pedicle screws. The pedicle screw entry zones were identified utilizing surface landmarks and fluoroscopy. I drilled into each pedicle utilizing the hand drill and EMG monitoring, and tapped each pedicle with the appropriate tap. We palpated with a ball probe to assure no break in the cortex. We then placed 5-0 x 35 mm cortical pedicle screw was into the pedicles bilaterally at L3 and L4 bilaterally. We then decorticated the transverse processes and laid a mixture of morcellized autograft and allograft out over these to perform intertransverse arthrodesis at L2-L4. We then placed lordotic rods into the multiaxial screw heads of the pedicle screws and locked these in position with the locking caps and anti-torque device. We then checked our construct with AP and lateral fluoroscopy. Irrigated with copious amounts of bacitracin-containing saline solution. Placed a medium Hemovac drain through separate stab incision. Inspected the nerve roots once again to assure adequate decompression, lined to the dura with Gelfoam, and closed the muscle and the fascia with 0 Vicryl. Closed the subcutaneous tissues with 2-0 Vicryl and subcuticular tissues with 3-0 Vicryl. The skin was closed with benzoin and Steri-Strips. Dressing was then applied, the patient was awakened from general anesthesia and transported to the recovery room in stable condition. At the end of the procedure all sponge, needle and instrument counts were correct.   PLAN OF CARE: Admit to inpatient   PATIENT DISPOSITION:  PACU - hemodynamically stable.   Delay start of Pharmacological VTE agent (>24hrs) due to surgical blood loss or risk of bleeding:  yes

## 2015-12-13 NOTE — Progress Notes (Signed)
Patient arrived on unit from PACU. Pt alert and oriented to unit and complaining of pain.

## 2015-12-13 NOTE — Anesthesia Preprocedure Evaluation (Signed)
Anesthesia Evaluation  Patient identified by MRN, date of birth, ID band Patient awake    Reviewed: Allergy & Precautions, NPO status , Patient's Chart, lab work & pertinent test results, reviewed documented beta blocker date and time   History of Anesthesia Complications (+) PONV and history of anesthetic complications  Airway Mallampati: II  TM Distance: >3 FB Neck ROM: Full    Dental  (+) Teeth Intact, Dental Advisory Given   Pulmonary sleep apnea and Continuous Positive Airway Pressure Ventilation ,    breath sounds clear to auscultation       Cardiovascular hypertension, Pt. on medications and Pt. on home beta blockers  Rhythm:Regular Rate:Normal     Neuro/Psych  Headaches, PSYCHIATRIC DISORDERS Depression  Neuromuscular disease    GI/Hepatic negative GI ROS, Neg liver ROS,   Endo/Other  diabetes, Type 2, Oral Hypoglycemic AgentsHypothyroidism   Renal/GU   negative genitourinary   Musculoskeletal  (+) Arthritis ,   Abdominal (+) + obese,   Peds negative pediatric ROS (+)  Hematology negative hematology ROS (+)   Anesthesia Other Findings   Reproductive/Obstetrics negative OB ROS                             Anesthesia Physical Anesthesia Plan  ASA: III  Anesthesia Plan: General   Post-op Pain Management:    Induction: Intravenous, Rapid sequence and Cricoid pressure planned  Airway Management Planned: Oral ETT  Additional Equipment:   Intra-op Plan:   Post-operative Plan: Extubation in OR  Informed Consent: I have reviewed the patients History and Physical, chart, labs and discussed the procedure including the risks, benefits and alternatives for the proposed anesthesia with the patient or authorized representative who has indicated his/her understanding and acceptance.     Plan Discussed with: CRNA  Anesthesia Plan Comments: (Tripped over speed bump in the parking  lot, denies dizziness, LOC and HA  Nauseated all night after eating.)        Anesthesia Quick Evaluation

## 2015-12-13 NOTE — Transfer of Care (Signed)
Immediate Anesthesia Transfer of Care Note  Patient: Nobie PutnamGail T Chambers  Procedure(s) Performed: Procedure(s): Lumbar two-three - Lumbar three-four MAXIMUM ACCESS (MAS) POSTERIOR LUMBAR INTERBODY FUSION (PLIF)   (N/A)  Patient Location: PACU  Anesthesia Type:General  Level of Consciousness: awake, alert  and oriented  Airway & Oxygen Therapy: Patient Spontanous Breathing and Patient connected to nasal cannula oxygen  Post-op Assessment: Report given to RN and Post -op Vital signs reviewed and stable  Post vital signs: Reviewed and stable  Last Vitals:  Vitals:   12/13/15 0810  BP: 130/68  Pulse: 63  Resp: 18  Temp: 36.8 C    Last Pain:  Vitals:   12/13/15 0810  TempSrc: Oral  PainSc:          Complications: No apparent anesthesia complications

## 2015-12-13 NOTE — Progress Notes (Signed)
Patient did not take beta blocker this morning.  Spoke with Dr Hart RochesterHollis about nausea patient is having this morning and asked about some nausea medicaion.  Dr Hart Rochesterhollis stated that he would take care of that in the OR and will give metoprolol iv when patient goes up for surgery

## 2015-12-13 NOTE — Progress Notes (Signed)
Patient was able to stand up next to her bed and take a few steps this evening. Dressing is clean, dry, intact. Not able to walk in hallway because pt does not have her brace at this time. Was told by previous RN it would arrive in the am.  Pain medications given. Continuing to monitor. Javoni Lucken, [place] ScrapeSarah E, RN

## 2015-12-13 NOTE — Anesthesia Postprocedure Evaluation (Signed)
Anesthesia Post Note  Patient: Jessica Cole  Procedure(s) Performed: Procedure(s) (LRB): Lumbar two-three - Lumbar three-four MAXIMUM ACCESS (MAS) POSTERIOR LUMBAR INTERBODY FUSION (PLIF)   (N/A)  Patient location during evaluation: PACU Anesthesia Type: General Level of consciousness: awake and alert Pain management: pain level controlled Vital Signs Assessment: post-procedure vital signs reviewed and stable Respiratory status: spontaneous breathing, nonlabored ventilation, respiratory function stable and patient connected to nasal cannula oxygen Cardiovascular status: blood pressure returned to baseline and stable Postop Assessment: no signs of nausea or vomiting Anesthetic complications: no    Last Vitals:  Vitals:   12/13/15 1420 12/13/15 1435  BP: (!) 145/80 130/82  Pulse: 64 61  Resp: (!) 32 (!) 22  Temp:      Last Pain:  Vitals:   12/13/15 1435  TempSrc:   PainSc: 10-Worst pain ever                 Shelton SilvasKevin D Hollis

## 2015-12-13 NOTE — Anesthesia Procedure Notes (Signed)
Procedure Name: Intubation Date/Time: 12/13/2015 9:40 AM Performed by: Glo HerringLEE, Armonie Staten B Pre-anesthesia Checklist: Patient identified, Emergency Drugs available, Suction available, Patient being monitored and Timeout performed Patient Re-evaluated:Patient Re-evaluated prior to inductionOxygen Delivery Method: Circle system utilized Preoxygenation: Pre-oxygenation with 100% oxygen Intubation Type: IV induction and Rapid sequence Laryngoscope Size: Mac and 3 Grade View: Grade I Tube type: Oral Tube size: 7.0 mm Number of attempts: 1 Airway Equipment and Method: Stylet Placement Confirmation: breath sounds checked- equal and bilateral,  CO2 detector,  positive ETCO2 and ETT inserted through vocal cords under direct vision Secured at: 21 cm Tube secured with: Tape Dental Injury: Teeth and Oropharynx as per pre-operative assessment

## 2015-12-14 ENCOUNTER — Encounter (HOSPITAL_COMMUNITY): Payer: Self-pay | Admitting: Neurological Surgery

## 2015-12-14 LAB — GLUCOSE, CAPILLARY
GLUCOSE-CAPILLARY: 165 mg/dL — AB (ref 65–99)
GLUCOSE-CAPILLARY: 130 mg/dL — AB (ref 65–99)
GLUCOSE-CAPILLARY: 178 mg/dL — AB (ref 65–99)
Glucose-Capillary: 132 mg/dL — ABNORMAL HIGH (ref 65–99)

## 2015-12-14 MED FILL — Heparin Sodium (Porcine) Inj 1000 Unit/ML: INTRAMUSCULAR | Qty: 30 | Status: AC

## 2015-12-14 MED FILL — Sodium Chloride IV Soln 0.9%: INTRAVENOUS | Qty: 1000 | Status: AC

## 2015-12-14 NOTE — Progress Notes (Signed)
Patient ID: Jessica Cole, female   DOB: 05-01-1951, 65 y.o.   MRN: [id number] .[id number]. Subjective: Patient reports mild back soreness, no leg pain or NTW  Objective: Vital signs in last 24 hours: Temp:  [97.8 F (36.6 C)-98.8 F (37.1 C)] 98.8 F (37.1 C) (08/11 1013) Pulse Rate:  [58-96] 58 (08/11 1013) Resp:  [16-40] 16 (08/11 1013) BP: (97-145)/(43-85) 108/60 (08/11 1013) SpO2:  [93 %-100 %] 95 % (08/11 1013)  Intake/Output from previous day: 08/10 0701 - 08/11 0700 In: 2783.8 [P.O.:60; I.V.:2658.8; IV Piggyback:55] Out: 1280 [Urine:1090; Drains:40; Blood:150] Intake/Output this shift: Total I/O In: 120 [P.O.:120] Out: -   Neurologic: Grossly normal  Lab Results: Lab Results  Component Value Date   WBC 6.7 12/05/2015   HGB 15.0 12/05/2015   HCT 43.4 12/05/2015   MCV 89.1 12/05/2015   PLT 171 12/05/2015   Lab Results  Component Value Date   INR 0.99 12/05/2015   BMET Lab Results  Component Value Date   NA 140 12/05/2015   K 3.8 12/05/2015   CL 105 12/05/2015   CO2 27 12/05/2015   GLUCOSE 162 (H) 12/05/2015   BUN 12 12/05/2015   CREATININE 0.65 12/05/2015   CALCIUM 10.1 12/05/2015    Studies/Results: Dg Lumbar Spine 2-3 Views  Result Date: 12/13/2015 CLINICAL DATA:  PLIF L2-L4 EXAM: DG C-ARM 61-120 MIN; LUMBAR SPINE - 2-3 VIEW COMPARISON:  10/31/2015 lumbar spine CT FINDINGS: Fluoroscopy time 1 minutes 49 seconds. Three spot fluoroscopic nondiagnostic intraoperative radiographs demonstrate postsurgical changes from bilateral posterior lumbar spine fusion from L2-L4. IMPRESSION: Intraoperative fluoroscopic guidance for PLIF L2-L4. Electronically Signed   By: Delbert PhenixJason A Poff M.D.   On: 12/13/2015 13:40   Dg C-arm 61-120 Min  Result Date: 12/13/2015 CLINICAL DATA:  PLIF L2-L4 EXAM: DG C-ARM 61-120 MIN; LUMBAR SPINE - 2-3 VIEW COMPARISON:  10/31/2015 lumbar spine CT FINDINGS: Fluoroscopy time 1 minutes 49 seconds. Three spot fluoroscopic nondiagnostic  intraoperative radiographs demonstrate postsurgical changes from bilateral posterior lumbar spine fusion from L2-L4. IMPRESSION: Intraoperative fluoroscopic guidance for PLIF L2-L4. Electronically Signed   By: Delbert PhenixJason A Poff M.D.   On: 12/13/2015 13:40    Assessment/Plan: Doing well, maybe home tomorrow, continue to mobilize   LOS: 1 day    Kamarrion Stfort S 12/14/2015, 11:42 AM

## 2015-12-14 NOTE — Progress Notes (Signed)
Occupational Therapy Evaluation Patient Details Name: Nobie PutnamGail T Semel MRN: [id number] DOB: Aug 31, 1950 Today's Date: 12/14/2015    History of Present Illness s/p L2-4 Max PLIF   Clinical Impression   PTA, pt independent with ADL and mobility. Began education regarding ADL and compensatory techniques and use of DME and AE. Pt anxious about moving and feels more comfortable with D/C tomorrow. Will need to complete education regarding compensatory techniques and use of AE and DME for ADL and functional mobility for ADL  With pt and husband. Would benefit from continued HHOT to facilitate return to PLOF. Pt is anxious about how much her husband will be able to assist her after D/C.     Follow Up Recommendations  Home health OT;Supervision/Assistance - 24 hour (initially)    Equipment Recommendations  3 in 1 bedside comode    Recommendations for Other Services       Precautions / Restrictions Precautions Precautions: Back Precaution Booklet Issued: Yes (comment) Restrictions Weight Bearing Restrictions: No      Mobility Bed Mobility Overal bed mobility: Needs Assistance Bed Mobility: Sidelying to Sit   Sidelying to sit: Supervision       General bed mobility comments: use of bed rails  Transfers Overall transfer level: Needs assistance Equipment used: Rolling walker (2 wheeled) Transfers: Sit to/from Stand Sit to Stand: Min guard         General transfer comment: vc for technique and hand placement on RW    Balance Overall balance assessment: Needs assistance   Sitting balance-Leahy Scale: Good       Standing balance-Leahy Scale: Fair                              ADL Overall ADL's : Needs assistance/impaired     Grooming: Set up;Supervision/safety;Standing   Upper Body Bathing: Set up;Supervision/ safety;Sitting   Lower Body Bathing: Moderate assistance;Sit to/from stand   Upper Body Dressing : Minimal assistance;Sitting   Lower Body  Dressing: Moderate assistance;Sit to/from stand   Toilet Transfer: Min guard;RW;Ambulation;Comfort height toilet   Toileting- ArchitectClothing Manipulation and Hygiene: Moderate assistance       Functional mobility during ADLs: Min guard;Supervision/safety;Rolling walker;Cueing for safety;Cueing for sequencing General ADL Comments: Pt asking about hygiene after toileting. Began educating pt on use of AE for LB ADL and hygiene. Educated on back precuaitons during ADL. Pt requries mod vc to follow. Appears anxious. Expressing concerns over how her husband will be able to help her.      Vision     Perception     Praxis      Pertinent Vitals/Pain Pain Assessment: 0-10 Pain Score: 4  Pain Location: back Pain Descriptors / Indicators: Aching Pain Intervention(s): Limited activity within patient's tolerance;Repositioned     Hand Dominance Right   Extremity/Trunk Assessment Upper Extremity Assessment Upper Extremity Assessment: Overall WFL for tasks assessed   Lower Extremity Assessment Lower Extremity Assessment: Generalized weakness (LLE weaker than R per pt)   Cervical / Trunk Assessment Cervical / Trunk Assessment: Other exceptions (back surgery)   Communication Communication Communication: No difficulties   Cognition Arousal/Alertness: Awake/alert Behavior During Therapy: Anxious Overall Cognitive Status: Within Functional Limits for tasks assessed                     General Comments       Exercises       Shoulder Instructions      Home Living Family/patient  expects to be discharged to:: Private residence Living Arrangements: Spouse/significant other Available Help at Discharge: Family;Available 24 hours/day Type of Home: House Home Access: Stairs to enter [employer] of Steps: 1 Entrance Stairs-Rails: Right;Left Home Layout: One level     Bathroom Shower/Tub: Tub/shower unit;Curtain Shower/tub characteristics: Engineer, building services:  Standard Bathroom Accessibility: Yes How Accessible: Accessible via walker Home Equipment: Cane - single point          Prior Functioning/Environment Level of Independence: Independent             OT Diagnosis: Generalized weakness;Acute pain   OT Problem List: Decreased strength;Decreased range of motion;Decreased activity tolerance;Impaired balance (sitting and/or standing);Decreased safety awareness;Decreased knowledge of use of DME or AE;Decreased knowledge of precautions;Obesity;Pain   OT Treatment/Interventions: Self-care/ADL training;DME and/or AE instruction;Therapeutic activities;Patient/family education    OT Goals(Current goals can be found in the care plan section) Acute Rehab OT Goals Patient Stated Goal: to stay tonight and go home tomorrow OT Goal Formulation: With patient Time For Goal Achievement: 12/21/15 Potential to Achieve Goals: Good ADL Goals Pt Will Perform Lower Body Bathing: with min assist;with caregiver independent in assisting;sit to/from stand;with min guard assist Pt Will Perform Lower Body Dressing: with min guard assist;with adaptive equipment;sit to/from stand;with caregiver independent in assisting Pt Will Transfer to Toilet: with modified independence;ambulating;bedside commode Pt Will Perform Toileting - Clothing Manipulation and hygiene: with caregiver independent in assisting;with adaptive equipment;sitting/lateral leans;with min guard assist Pt Will Perform Tub/Shower Transfer: with min assist;with caregiver independent in assisting;3 in 1;ambulating;rolling walker;Tub transfer (with care giver assisting) Additional ADL Goal #1: Pt will independently verbalize 3/3 back precautions  OT Frequency: Min 2X/week   Barriers to D/C:            Co-evaluation              End of Session Equipment Utilized During Treatment: Gait belt;Rolling walker Nurse Communication: Mobility status  Activity Tolerance: Patient tolerated treatment  well Patient left: in chair;with call bell/phone within reach   Time: 0900-0925 OT Time Calculation (min): 25 min Charges:  OT General Charges $OT Visit: 1 Procedure OT Evaluation $OT Eval Moderate Complexity: 1 Procedure OT Treatments $Self Care/Home Management : 8-22 mins G-Codes:    Jakub Debold,HILLARY 01/05/16, 10:33 AM  Luisa Dago, OTR/L  [phone] 05-Jan-2016

## 2015-12-14 NOTE — Evaluation (Signed)
Physical Therapy Evaluation Patient Details Name: Jessica Cole MRN: [id number] DOB: 28-Apr-1951 Today's Date: 12/14/2015   History of Present Illness  s/p L2-4 Max PLIF  Clinical Impression  Patient presents with decreased independence with mobility due to deficits listed in PT problem list below.  She will benefit from skilled PT in the acute setting to allow return home with family support.  Not recommending any follow up PT at this time.     Follow Up Recommendations No PT follow up    Equipment Recommendations  None recommended by PT    Recommendations for Other Services       Precautions / Restrictions Precautions Precautions: Back Precaution Booklet Issued:  (already issued by OT) Required Braces or Orthoses: Spinal Brace Spinal Brace: Lumbar corset;Applied in sitting position;Applied in standing position Restrictions Weight Bearing Restrictions: No      Mobility  Bed Mobility Overal bed mobility: Needs Assistance Bed Mobility: Sidelying to Sit;Sit to Sidelying   Sidelying to sit: Min guard     Sit to sidelying: Min assist General bed mobility comments: use of rail to sit with assist for technique, trunk due to pain; assist for legs to sidelying and for positioning with pillows  Transfers Overall transfer level: Needs assistance Equipment used: Rolling walker (2 wheeled) Transfers: Sit to/from Stand Sit to Stand: Min guard         General transfer comment: assist for safety with walker and for technique esp on/off toilet  Ambulation/Gait Ambulation/Gait assistance: Min guard;Supervision Ambulation Distance (Feet): 125 Feet Assistive device: Rolling walker (2 wheeled) Gait Pattern/deviations: Step-through pattern;Decreased stance time - left;Decreased stride length     General Gait Details: slow pace and antalgic at times due to pain bilateral anterior thighs  Stairs            Wheelchair Mobility    Modified Rankin (Stroke Patients Only)        Balance Overall balance assessment: Needs assistance Sitting-balance support: Single extremity supported Sitting balance-Leahy Scale: Good       Standing balance-Leahy Scale: Fair Standing balance comment: standing to put on brace with S/minguard for safety                             Pertinent Vitals/Pain Pain Assessment: 0-10 Pain Score: 7  Pain Location: back with ambulation Pain Descriptors / Indicators: Aching Pain Intervention(s): Limited activity within patient's tolerance;Monitored during session;Repositioned    Home Living Family/patient expects to be discharged to:: Private residence Living Arrangements: Spouse/significant other Available Help at Discharge: Family;Available 24 hours/day Type of Home: House Home Access: Stairs to enter Entrance Stairs-Rails: Doctor, general practiceight;Left Entrance Stairs-Number of Steps: 1 Home Layout: One level Home Equipment: Cane - single point (can borrow RW from brother [place])      Prior Function Level of Independence: Independent               Hand Dominance   Dominant Hand: Right    Extremity/Trunk Assessment   Upper Extremity Assessment: Defer to OT evaluation           Lower Extremity Assessment: Generalized weakness (L weaker than R per pt)      Cervical / Trunk Assessment: Other exceptions (back surgery)  Communication   Communication: No difficulties  Cognition Arousal/Alertness: Awake/alert Behavior During Therapy: WFL for tasks assessed/performed Overall Cognitive Status: Within Functional Limits for tasks assessed  General Comments General comments (skin integrity, edema, etc.): Educated in safety with car transfers and to request assist to walk in hallway twice more today.    Exercises        Assessment/Plan    PT Assessment Patient needs continued PT services  PT Diagnosis Acute pain;Generalized weakness   PT Problem List Decreased mobility;Decreased  knowledge of precautions;Pain;Decreased knowledge of use of DME;Decreased activity tolerance;Decreased strength  PT Treatment Interventions DME instruction;Gait training;Stair training;Functional mobility training;Balance training;Therapeutic exercise;Therapeutic activities;Patient/family education   PT Goals (Current goals can be found in the Care Plan section) Acute Rehab PT Goals Patient Stated Goal: To return home PT Goal Formulation: With patient Time For Goal Achievement: 12/17/15 Potential to Achieve Goals: Good    Frequency Min 5X/week   Barriers to discharge        Co-evaluation               End of Session Equipment Utilized During Treatment: Back brace Activity Tolerance: Patient limited by pain Patient left: in bed;with call bell/phone within reach;with family/visitor present;with bed alarm set           Time: 1610-9604 PT Time Calculation (min) (ACUTE ONLY): 24 min   Charges:   PT Evaluation $PT Eval Moderate Complexity: 1 Procedure PT Treatments $Gait Training: 8-22 mins   PT G CodesElray Mcgregor 2015-12-20, 11:38 AM  Sheran Lawless, PT [phone] 20-Dec-2015

## 2015-12-14 NOTE — Progress Notes (Signed)
RT set up patient CPAP. Patient needs not assistance in putting on mask. No O2 bleed in needed.

## 2015-12-14 NOTE — Care Management Note (Signed)
Case Management Note  Patient Details  Name: Jessica Cole MRN: [id number] Date of Birth: 05-23-1950  Subjective/Objective:                    Action/Plan: PT with no f/u [facility] with orders for 3 in 1 and walker. [facility]. He will deliver the DME to the room. CM following for further d/c needs.   Expected Discharge Date:                  Expected Discharge Plan:  Home/Self Care  In-House Referral:     Discharge planning Services  CM Consult  Post Acute Care Choice:  Durable Medical Equipment Choice offered to:  Patient  DME Arranged:  3-N-1, Walker rolling DME Agency:  [facility]  HH Arranged:    HH Agency:     Status of Service:  In process, will continue to follow  If discussed at Long Length of Stay Meetings, dates discussed:    Additional Comments:  Kermit BaloKelli F Labrandon Knoch, RN 12/14/2015, 2:17 PM

## 2015-12-15 LAB — GLUCOSE, CAPILLARY
GLUCOSE-CAPILLARY: 138 mg/dL — AB (ref 65–99)
GLUCOSE-CAPILLARY: 139 mg/dL — AB (ref 65–99)
GLUCOSE-CAPILLARY: 131 mg/dL — AB (ref 65–99)
Glucose-Capillary: 123 mg/dL — ABNORMAL HIGH (ref 65–99)

## 2015-12-15 NOTE — Progress Notes (Signed)
Pt. seen for h/s CPAP, was not ready @ this time, made aware by pt. that she is able to place on independantly, is not using oxygen currently, made aware to notify if help needed, plan to check on rounds.

## 2015-12-15 NOTE — Progress Notes (Signed)
Patient placed on CPAP herself with home nasal mask. Tolerates well and will call if any further assistance needed.

## 2015-12-15 NOTE — Progress Notes (Signed)
Patient ID: Jessica Cole, female   DOB: Oct 23, 1950, 65 y.o.   MRN: [id number] Vital signs are stable Motor function is intact Patient moving about slowly Drain still in place We'll remove dressing today as drainage has been decreasing Continue to encourage mobility

## 2015-12-15 NOTE — Progress Notes (Signed)
Occupational Therapy Treatment Patient Details Name: Jessica Cole MRN: [id number] DOB: Nov 19, 1950 Today's Date: 12/15/2015    History of present illness s/p L2-4 Max PLIF   OT comments  Pt making good progress toward OT goals this session but continues to c/o of significant pain. Educated pt on use of AE for increased independence with ADL; pt able to return demo use of AE. Pt able to perform toilet transfer, peri care, and grooming activities standing at the sink with supervision. Pt able to recall 3/3 back precautions, maintain throughout functional activities, and don/doff back brace with set up. [facility] to maximize independence and safety with ADL and functional mobility upon return home. Will continue to follow acutely.   Follow Up Recommendations  Home health OT;Supervision/Assistance - 24 hour    Equipment Recommendations  3 in 1 bedside comode    Recommendations for Other Services      Precautions / Restrictions Precautions Precautions: Back Precaution Comments: Pt able to verbally recall 3/3 back precautions. Required Braces or Orthoses: Spinal Brace Spinal Brace: Lumbar corset;Applied in sitting position;Applied in standing position Restrictions Weight Bearing Restrictions: No       Mobility Bed Mobility Overal bed mobility: Needs Assistance Bed Mobility: Rolling;Sit to Sidelying Rolling: Supervision       Sit to sidelying: Min guard General bed mobility comments: Pt able to lift LEs into bed; no physical assist required. HOB flat without use of bed rails.  Transfers Overall transfer level: Needs assistance Equipment used: Rolling walker (2 wheeled) Transfers: Sit to/from Stand Sit to Stand: Supervision         General transfer comment: Supervision for safety with sit to stand from chair x1, toilet x1, EOB x1.    Balance Overall balance assessment: Needs assistance Sitting-balance support: Feet  supported;No upper extremity supported Sitting balance-Leahy Scale: Good     Standing balance support: No upper extremity supported;During functional activity Standing balance-Leahy Scale: Good                     ADL Overall ADL's : Needs assistance/impaired     Grooming: Supervision/safety;Standing;Oral care;Wash/dry hands Grooming Details (indicate cue type and reason): Pt demonstrated use of cup for oral care       Lower Body Bathing Details (indicate cue type and reason): Educated pt on use of long handled sponge for increased independence with LB bathing. Upper Body Dressing : Set up;Sitting Upper Body Dressing Details (indicate cue type and reason): to don/doff brace Lower Body Dressing: Min guard;Sit to/from stand;With adaptive equipment Lower Body Dressing Details (indicate cue type and reason): Educated pt on use of long handled shoe horn, reacher, and sock aide; pt able to return demo use.  Toilet Transfer: Supervision/safety;Ambulation;BSC;RW Toilet Transfer Details (indicate cue type and reason): Educated on use of 3 in 1 over toilet. Toileting- Clothing Manipulation and Hygiene: Supervision/safety;Sit to/from stand Toileting - Clothing Manipulation Details (indicate cue type and reason): for peri care and clothing manipulation     Functional mobility during ADLs: Supervision/safety;Rolling walker General ADL Comments: Reviewed maintaining back precautions during functional activities, log roll technique for bed mobility, brace wear schedule. Educated on what pt can use for toilet aide.      Vision                     Perception     Praxis      Cognition   Behavior During Therapy:  WFL for tasks assessed/performed Overall Cognitive Status: Within Functional Limits for tasks assessed                       Extremity/Trunk Assessment               Exercises     Shoulder Instructions       General Comments      Pertinent  Vitals/ Pain       Pain Assessment: Faces Faces Pain Scale: Hurts even more Pain Location: back, head Pain Descriptors / Indicators: Aching;Grimacing;Guarding;Operative site guarding Pain Intervention(s): Monitored during session;Repositioned;Patient requesting pain meds-RN notified  Home Living                                          Prior Functioning/Environment              Frequency Min 2X/week     Progress Toward Goals  OT Goals(current goals can now be found in the care plan section)  Progress towards OT goals: Progressing toward goals  Acute Rehab OT Goals Patient Stated Goal: decrease pain OT Goal Formulation: With patient  Plan Discharge plan remains appropriate    Co-evaluation                 End of Session Equipment Utilized During Treatment: Rolling walker;Back brace   Activity Tolerance Patient tolerated treatment well   Patient Left in bed;with call bell/phone within reach;with family/visitor present   Nurse Communication Mobility status;Patient requests pain meds;Other (comment) (pt requesting ice pack)        Time: 1610-9604 OT Time Calculation (min): 27 min  Charges: OT General Charges $OT Visit: 1 Procedure OT Treatments $Self Care/Home Management : 23-37 mins  Gaye Alken M.S., OTR/L Pager: [phone]  12/15/2015, 10:53 AM

## 2015-12-15 NOTE — Progress Notes (Signed)
Physical Therapy Treatment Patient Details Name: Jessica Cole MRN: [id number] DOB: 08-05-1950 Today's Date: 12/15/2015    History of Present Illness s/p L2-4 Max PLIF    PT Comments    Pt performed increased gait training and progressed to stair training.  Pt remains focused on pain and required cues throughout to breathe during mobility.  Pt remains on track to d/c home.    Follow Up Recommendations  No PT follow up     Equipment Recommendations  None recommended by PT    Recommendations for Other Services       Precautions / Restrictions Precautions Precautions: Back Precaution Comments: Pt able to verbally recall 3/3 back precautions. Required Braces or Orthoses: Spinal Brace Spinal Brace: Lumbar corset;Applied in sitting position;Applied in standing position (educated patient on correct fit.  ) Restrictions Weight Bearing Restrictions: No    Mobility  Bed Mobility Overal bed mobility: Needs Assistance Bed Mobility: Rolling;Sidelying to Sit;Sit to Sidelying Rolling: Supervision Sidelying to sit: Supervision     Sit to sidelying: Supervision General bed mobility comments: Cues to avoid twisting when pushing from bed to elevate trunk into sitting.    Transfers Overall transfer level: Needs assistance Equipment used: Rolling walker (2 wheeled) Transfers: Sit to/from Stand Sit to Stand: Supervision         General transfer comment: Cues for safety and hand placement to and from seated surface.    Ambulation/Gait Ambulation/Gait assistance: Supervision Ambulation Distance (Feet): 180 Feet Assistive device: Rolling walker (2 wheeled) Gait Pattern/deviations: Step-through pattern;Decreased stride length   Gait velocity interpretation: Below normal speed for age/gender General Gait Details: Pt performed increased mobility, required cues for breathing as pt has a tendency to hold her breath.  Pt required cues for scapular retraction to improve posture.      Stairs Stairs: Yes   Stair Management: One rail Right;Sideways;Forwards Number of Stairs: 2 General stair comments: Cues for sequencing, body position and hand placement.  Pt required cues for safety to avoid twisting motion.    Wheelchair Mobility    Modified Rankin (Stroke Patients Only)       Balance Overall balance assessment: Needs assistance Sitting-balance support: Feet supported;No upper extremity supported Sitting balance-Leahy Scale: Good     Standing balance support: No upper extremity supported;During functional activity Standing balance-Leahy Scale: Fair                      Cognition Arousal/Alertness: Awake/alert Behavior During Therapy: WFL for tasks assessed/performed Overall Cognitive Status: Within Functional Limits for tasks assessed                      Exercises      General Comments        Pertinent Vitals/Pain Pain Assessment: 0-10 Pain Score: 7  Faces Pain Scale: Hurts even more Pain Location: Back and head.   Pain Descriptors / Indicators: Grimacing;Operative site guarding Pain Intervention(s): Monitored during session;Repositioned;Ice applied    Home Living                      Prior Function            PT Goals (current goals can now be found in the care plan section) Acute Rehab PT Goals Patient Stated Goal: decrease pain Potential to Achieve Goals: Good Progress towards PT goals: Progressing toward goals    Frequency  Min 5X/week    PT Plan Current plan remains appropriate  Co-evaluation             End of Session Equipment Utilized During Treatment: Back brace Activity Tolerance: Patient limited by pain Patient left: in bed;with call bell/phone within reach;with family/visitor present     Time: 1545-1601 PT Time Calculation (min) (ACUTE ONLY): 16 min  Charges:  $Gait Training: 8-22 mins                    G Codes:      Florestine Aversimee J Martrice Apt 12/15/2015, 2:09 PM  Joycelyn RuaAimee Jesslyn Viglione,  PTA pager [phone]

## 2015-12-16 LAB — GLUCOSE, CAPILLARY
GLUCOSE-CAPILLARY: 104 mg/dL — AB (ref 65–99)
GLUCOSE-CAPILLARY: 152 mg/dL — AB (ref 65–99)

## 2015-12-16 MED ORDER — METHOCARBAMOL 500 MG PO TABS: 500.0000 mg | ORAL_TABLET | Freq: Four times a day (QID) | ORAL | 0 refills | Status: DC | PRN

## 2015-12-16 MED ORDER — OXYCODONE HCL 10 MG PO TABS: 10.0000 mg | ORAL_TABLET | ORAL | 0 refills | Status: DC | PRN

## 2015-12-16 NOTE — Discharge Summary (Signed)
Physician Discharge Summary  Patient ID: Jessica Cole MRN: [id number] DOB/AGE: 10/04/1950 65 y.o.  Admit date: 12/13/2015 Discharge date: 12/16/2015  Admission Diagnoses: Lumbar spondylosis  Discharge Diagnoses: Same Active Problems:   S/P lumbar spinal fusion   Discharged Condition: Stable  Hospital Course:  Mrs. Jessica Cole is a 65 y.o. female electively admitted after uncomplicated lumbar fusion. She had an uneventful hospital course, with progressive ambulation. She was tolerating diet, voiding normally. Pain was controlled.  Treatments: Surgery - L2-4 PLIF  Discharge Exam: Blood pressure (!) 126/56, pulse 75, temperature 98.7 F (37.1 C), temperature source Oral, resp. rate 20, height 5' (1.524 m), weight 87.1 kg (192 lb), SpO2 98 %. Awake, alert, oriented Speech fluent, appropriate CN grossly intact 5/5 BUE/BLE Wound c/d/i  Disposition: Home  Discharge Instructions    Ambulatory referral to Home Health    Complete by:  As directed   [facility]  Disciplines requested: Physical Therapy and Occupational Therapy  Services to provide: Strengthening Exercises and Evaluate  Physician to follow patient's care (the person listed here will be responsible for signing ongoing orders): Other: Dr. Marikay Alaravid Jones, MD  Requested Start of Care Date: Tomorrow  I certify that this patient is under my care and that I, or a Nurse Practitioner or Physician's Assistant working with me, had a face-to-face encounter that meets the physician face-to-face requirements with patient on 12/16/15. The encounter with the patient was in whole, or in part for the following medical condition(s) which is the primary reason for home health care (List medical condition). Lumbar spondylosis/stenosis   Does the patient have Medicare or Medicaid?:  Yes   The encounter with the patient was in whole, or in part, for the following medical condition, which is  the primary reason for home health care:  lumbar stenosis, spondylosis   Reason for Medically Necessary Home Health Services:  Therapy- Therapeutic Exercises to Increase Strength and Endurance   My clinical findings support the need for the above services:  Pain interferes with ambulation/mobility   I certify that, based on my findings, the following services are medically necessary home health services:  Physical therapy   Further, I certify that my clinical findings support that this patient is homebound due to:  Pain interferes with ambulation/mobility       Medication List    TAKE these medications   ALLER-EASE 180 MG tablet Generic drug:  fexofenadine Take 180 mg by mouth daily.   aspirin EC 81 MG EC tablet Generic drug:  aspirin Take 81 mg by mouth every other day. Swallow whole. In the evening   azelastine 0.1 % nasal spray Commonly known as:  ASTELIN Place 2 sprays into both nostrils 2 (two) times daily.   busPIRone 15 MG tablet Commonly known as:  BUSPAR Take 15 mg by mouth 2 (two) times daily as needed (for anxiety).   Co Q 10 100 MG Caps Take 100 mg by mouth daily.   diclofenac sodium 1 % Gel Commonly known as:  VOLTAREN Apply 4 g topically 2 (two) times daily. What changed:  when to take this  reasons to take this   DULoxetine 30 MG capsule Commonly known as:  CYMBALTA Take 60 mg by mouth daily.   enalapril 10 MG tablet Commonly known as:  VASOTEC Take 10 mg by mouth daily.   estradiol 0.05 MG/24HR patch Commonly known as:  VIVELLE-DOT Place 1 patch onto the skin 2 (two) times  a week.   FISH OIL ULTRA 1000 MG Caps Take 1,000 mg by mouth 2 (two) times daily.   fluticasone 50 MCG/ACT nasal spray Commonly known as:  FLONASE Place 2 sprays into both nostrils 2 (two) times daily.   gabapentin 300 MG capsule Commonly known as:  NEURONTIN Take 600 mg by mouth 2 (two) times daily.   I-COOL FOR MENOPAUSE 30 MG Tabs Generic drug:  Genistein Take 30 mg  by mouth every morning.   latanoprost 0.005 % ophthalmic solution Commonly known as:  XALATAN Place 1 drop into both eyes at bedtime.   levothyroxine 112 MCG tablet Commonly known as:  SYNTHROID, LEVOTHROID Take 112 mcg by mouth daily before breakfast.   metFORMIN 500 MG tablet Commonly known as:  GLUCOPHAGE Take 500 mg by mouth 2 (two) times daily with a meal.   methocarbamol 500 MG tablet Commonly known as:  ROBAXIN Take 1 tablet (500 mg total) by mouth every 6 (six) hours as needed for muscle spasms.   metoprolol succinate 25 MG 24 hr tablet Commonly known as:  TOPROL-XL TAKE ONE (1) TABLET BY MOUTH EVERY DAY What changed:  See the new instructions.   Oxycodone HCl 10 MG Tabs Take 1 tablet (10 mg total) by mouth every 4 (four) hours as needed (for pain).   Red Yeast Rice 600 MG Caps Take 1,200 mg by mouth 2 (two) times daily.   rOPINIRole 3 MG tablet Commonly known as:  REQUIP TAKE ONE (1) TABLET BY MOUTH TWO (2) TIMES DAILY What changed:  See the new instructions.   traZODone 100 MG tablet Commonly known as:  DESYREL TAKE ONE TO TWO TABLETS BY MOUTH AT BEDTIME FOR SLEEP What changed:  See the new instructions.   VITAMIN B COMPLEX PO Take 1 tablet by mouth daily.      Follow-up Information    JONES,DAVID S, MD Follow up in 3 week(s).   Specialty:  Neurosurgery Contact information: [address] [place] [address] [phone]           Signed: Lisbeth Renshaw, Salena Saner 12/16/2015, 10:41 AM

## 2015-12-16 NOTE — Progress Notes (Signed)
Occupational Therapy Treatment Patient Details Name: Jessica Cole MRN: [id number] DOB: 04/26/1951 Today's Date: 12/16/2015    History of present illness s/p L2-4 Max PLIF   OT comments  Pt. Making gains with skilled OT and is clear for d/c from OT.  Able to complete bed mobility, simulated toilet, and tub transfer with s/min guard a.  Reports husband available to assist as needed.  No further questions.  Will alert OTR/l to sign off.    Follow Up Recommendations  Home health OT;Supervision/Assistance - 24 hour    Equipment Recommendations  3 in 1 bedside comode    Recommendations for Other Services      Precautions / Restrictions Precautions Precautions: Back Precaution Comments: Pt able to verbally recall 3/3 back precautions. Required Braces or Orthoses: Spinal Brace Spinal Brace: Lumbar corset;Applied in sitting position       Mobility Bed Mobility Overal bed mobility: Needs Assistance Bed Mobility: Rolling;Sidelying to Sit Rolling: Supervision Sidelying to sit: Supervision       General bed mobility comments: hob flat, no rails, will exit on L side at home-no physical assistance required  Transfers Overall transfer level: Needs assistance Equipment used: Rolling walker (2 wheeled) Transfers: Sit to/from [employer] Transfers Sit to Stand: Supervision Stand pivot transfers: Supervision            Balance                                   ADL Overall ADL's : Needs assistance/impaired               Lower Body Bathing Details (indicate cue type and reason): reports husaband available to assist as needed, also states he will purchase LH Sponge from gift shop prior to home Upper Body Dressing : Set up;Sitting Upper Body Dressing Details (indicate cue type and reason): to don/doff brace   Lower Body Dressing Details (indicate cue type and reason): pt. reports husband will assist as needed and that they already have some of the A/E at  home, only need to buy the [facility]  sponge Toilet Transfer: Supervision/safety;Ambulation;RW Toilet Transfer Details (indicate cue type and reason): simulated, reviewed all of the uses for 3n1 Toileting- Clothing Manipulation and Hygiene: Supervision/safety;Sit to/from stand Toileting - Clothing Manipulation Details (indicate cue type and reason): simulated Tub/ Shower Transfer: Tub transfer;Min guard;Ambulation;Rolling walker;3 in 1 Tub/Shower Transfer Details (indicate cue type and reason): educated on necessary hand placement for husband on rw during transfer in/out of tub to prevent RW from tipping sideways Functional mobility during ADLs: Supervision/safety;Rolling walker        Vision                     Perception     Praxis      Cognition   Behavior During Therapy: WFL for tasks assessed/performed Overall Cognitive Status: Within Functional Limits for tasks assessed                       Extremity/Trunk Assessment               Exercises     Shoulder Instructions       General Comments      Pertinent Vitals/ Pain       Pain Assessment:  (c/o spasms throughout session) Pain Location: B LES and lower back Pain Descriptors / Indicators: Spasm Pain Intervention(s): Limited activity  within patient's tolerance;Monitored during session;Repositioned;Heat applied  Home Living                                          Prior Functioning/Environment              Frequency Min 2X/week     Progress Toward Goals  OT Goals(current goals can now be found in the care plan section)  Progress towards OT goals: Goals met/education completed, patient discharged from [facility] Discharge plan remains appropriate    Co-evaluation                 End of Session Equipment Utilized During Treatment: Gait belt;Rolling walker;Back brace   Activity Tolerance Patient tolerated treatment well   Patient Left in chair;with call  bell/phone within reach   Nurse Communication          Time: 9584-4171 OT Time Calculation (min): 23 min  Charges: OT General Charges $OT Visit: 1 Procedure OT Treatments $Self Care/Home Management : 23-37 mins  Janice Coffin, COTA/L 12/16/2015, 9:35 AM

## 2015-12-16 NOTE — Progress Notes (Signed)
Physical Therapy Treatment Patient Details Name: Jessica Cole MRN: [id number] DOB: 1951-04-25 Today's Date: 12/16/2015    History of Present Illness s/p L2-4 Max PLIF    PT Comments    Pt performed increased mobility and will d/c this afternoon.  Pt feels comfortable with stair negotiation and able to verbalize technique.  Pt ready to d/c from a mobility stand point.    Follow Up Recommendations  No PT follow up     Equipment Recommendations  None recommended by PT    Recommendations for Other Services       Precautions / Restrictions Precautions Precautions: Back Precaution Comments: Pt able to verbally recall 3/3 back precautions. Required Braces or Orthoses: Spinal Brace Spinal Brace: Lumbar corset;Applied in sitting position Restrictions Weight Bearing Restrictions: No    Mobility  Bed Mobility Overal bed mobility: Needs Assistance Bed Mobility: Rolling;Sidelying to Sit Rolling: Supervision Sidelying to sit: Supervision       General bed mobility comments: hob flat, no rails, will exit on L side at home-no physical assistance required  Transfers Overall transfer level: Needs assistance Equipment used: Rolling walker (2 wheeled) Transfers: Sit to/from Stand Sit to Stand: Modified independent (Device/Increase time) Stand pivot transfers: Modified independent (Device/Increase time)       General transfer comment: Good technique.    Ambulation/Gait Ambulation/Gait assistance: Supervision Ambulation Distance (Feet): 350 Feet Assistive device: Rolling walker (2 wheeled) Gait Pattern/deviations: Step-through pattern Gait velocity: slower, guarded Gait velocity interpretation: Below normal speed for age/gender General Gait Details: Cues for safety, turns and backing.  Cues for pursed lip breathing and upper trunk control.     Stairs            Wheelchair Mobility    Modified Rankin (Stroke Patients Only)       Balance Overall balance  assessment: Needs assistance   Sitting balance-Leahy Scale: Good       Standing balance-Leahy Scale: Fair                      Cognition Arousal/Alertness: Awake/alert Behavior During Therapy: WFL for tasks assessed/performed Overall Cognitive Status: Within Functional Limits for tasks assessed                      Exercises      General Comments        Pertinent Vitals/Pain Pain Assessment: 0-10 Pain Score: 8  Pain Location: L low back.hip Pain Descriptors / Indicators: Spasm;Tightness Pain Intervention(s): Monitored during session    Home Living                      Prior Function            PT Goals (current goals can now be found in the care plan section) Acute Rehab PT Goals Patient Stated Goal: decrease pain Potential to Achieve Goals: Good Progress towards PT goals: Progressing toward goals    Frequency  Min 5X/week    PT Plan Current plan remains appropriate    Co-evaluation             End of Session Equipment Utilized During Treatment: Back brace;Gait belt Activity Tolerance: Patient limited by pain Patient left:  (up in room with husband packing belongings.  )     Time: 4098-1191 PT Time Calculation (min) (ACUTE ONLY): 12 min  Charges:  $Gait Training: 8-22 mins  G Codes:      Jessica Cole 12/16/2015, 10:59 AM Jessica Cole, PTA pager [phone]

## 2015-12-16 NOTE — Progress Notes (Signed)
No issues overnight. Cont to have appropriate back soreness, also c/o "restless legs."  EXAM:  BP (!) 126/56 (BP Location: Left Arm)   Pulse 75   Temp 98.7 F (37.1 C) (Oral)   Resp 20   Ht 5' (1.524 m)   Wt 87.1 kg (192 lb)   SpO2 98%   BMI 37.50 kg/m   Awake, alert, oriented  Speech fluent, appropriate  CN grossly intact  5/5 BUE/BLE  Wound c/d/i  IMPRESSION:  65 y.o. female POD# 3 s/p L2-4 PLIF, doing well.  PLAN: - Will d/c today with HHOT

## 2015-12-26 NOTE — Progress Notes (Signed)
12/26/2015-received phone call that [facility]. Pt discharged 12/16/2015 and no [facility] about [facility]. CM spoke to Mrs Casimiro NeedleMichael and she is interested in using [facility]. Lup[facility]Donna [facility] notified and accepted the [facility].

## 2016-01-18 ENCOUNTER — Telehealth: Payer: Self-pay | Admitting: Neurology

## 2016-01-18 NOTE — Telephone Encounter (Signed)
The patient has a three-day headache, she wanted something to break the headache, but she indicates that the headache is now getting better, she does not need any medication.

## 2016-01-18 NOTE — Telephone Encounter (Signed)
Pt called said she needs something to break the 3 day migraine. pls call [facility]. She said August she had back surgery and has been taking oxycodone(prn) and methocarbamol (prn) which has not helped with the HA.  Please call

## 2016-01-22 ENCOUNTER — Ambulatory Visit (INDEPENDENT_AMBULATORY_CARE_PROVIDER_SITE_OTHER): Payer: Medicare HMO | Admitting: Adult Health

## 2016-01-22 ENCOUNTER — Encounter: Payer: Self-pay | Admitting: Adult Health

## 2016-01-22 VITALS — BP 117/74 | HR 59 | Ht 60.0 in | Wt 198.4 lb

## 2016-01-22 DIAGNOSIS — G2581 Restless legs syndrome: Secondary | ICD-10-CM

## 2016-01-22 DIAGNOSIS — M545 Low back pain: Secondary | ICD-10-CM

## 2016-01-22 DIAGNOSIS — G43009 Migraine without aura, not intractable, without status migrainosus: Secondary | ICD-10-CM

## 2016-01-22 MED ORDER — GABAPENTIN 800 MG PO TABS: 800.0000 mg | ORAL_TABLET | Freq: Two times a day (BID) | ORAL | 5 refills | Status: DC

## 2016-01-22 NOTE — Patient Instructions (Addendum)
Increase gabapentin to 800 mg twice a day If your symptoms worsen or you develop new symptoms please let us know.

## 2016-01-22 NOTE — Progress Notes (Signed)
I have read the note, and I agree with the clinical assessment and plan.  Haydan Mansouri KEITH   

## 2016-01-22 NOTE — Progress Notes (Signed)
PATIENT: Jessica PutnamGail T Pinho DOB: [birth year]  REASON FOR VISIT: follow up- back pain, restless legs, headaches HISTORY FROM: patient  HISTORY OF PRESENT ILLNESS: Jessica Cole is a 65 year old female with a history of back pain with right-sided sciatica, restless legs and headaches. She returns today for follow-up. She reports in August she had back surgery with Dr. Yetta BarreJones. She states  recently her pain is slightly worse. She is currently taking hydrocodone. She also reports that her headache frequency in September has increased. Looking at her calendar she had 2 headaches in August. She reports that her headache location varies. She does have light and noise sensitivity. Does report mild nausea. She also reports that since surgery her restless legs has gotten slightly worse. She is currently on gabapentin and Requip. She returns today for an evaluation.  HISTORY 08/06/15: Jessica Cole is a 65 year old female with a history of obesity, low back pain with right-sided sciatica, restless legs and headaches. She returns today for follow-up. She reports that her primary complaint is her ongoing back pain. She states that she had a myelogram with Dr. Yetta BarreJones and has a follow-up appointment tomorrow. She is hoping that surgery will be an option for her. She reports that her back pain exacerbates her restless legs. She states that she feels that the Requip would work well for her if she did not have back pain as well. The patient is currently taking trazodone to help with sleep however she states that the back pain usually prohibits this. She continues to take oxycodone for her discomfort. She denies any new neurological symptoms. She returns today for an evaluation.  HISTORY 05/08/15 (WILLIS): Jessica Cole is a 65 year old right-handed white female with a history of obesity, low back pain with right-sided sciatica, and migraine headache. The patient also reports difficulty with restless leg syndrome. The patient has  undergone MRI evaluation of the low back showing possible right L3 nerve root compression, the patient has been seen by neurosurgery. The patient has not been considered for surgery at this time, she was sent for physical therapy. The patient is engaged in physical therapy at this time. The patient reports some variability with her headaches, in October and November, she had 3 headaches each month, but she had 11 headaches in December. The patient indicates that weather may be a factor in her headache. The patient is on gabapentin and Cymbalta currently. She takes trazodone at night 200 mg for sleep. She has restless leg syndrome and she takes Requip taking 2 mg twice daily. The patient is having some issues with restless legs at this time. The patient continues to have back pain that wakes her up at night, she has pain in the low back radiating down the leg to the ankle. No weakness has been noted. The patient reports that the pain is now starting some on the left side.   REVIEW OF SYSTEMS: Out of a complete 14 system review of symptoms, the patient complains only of the following symptoms, and all other reviewed systems are negative.  Nervous/anxious, headache, numbness, bruise/bleed easily, environmental allergies, frequency of urination, joint pain, back pain, muscle cramps, moles, itching, snoring, sleep talking, apnea, insomnia, restless leg, constipation, excessive eating, eye itching, cough, ringing in ears, runny nose, activity change  ALLERGIES: Allergies  Allergen Reactions  . Dexamethasone Other (See Comments)    During a tapered dose, once.  Was shaky (side effect, not allergy).    HOME MEDICATIONS: Outpatient Medications Prior to Visit  Medication  Sig Dispense Refill  . aspirin (ASPIRIN EC) 81 MG EC tablet Take 81 mg by mouth every other day. Swallow whole. In the evening    . azelastine (ASTELIN) 0.1 % nasal spray Place 2 sprays into both nostrils 2 (two) times daily.     . B  Complex Vitamins (VITAMIN B COMPLEX PO) Take 1 tablet by mouth daily.     . busPIRone (BUSPAR) 15 MG tablet Take 15 mg by mouth 2 (two) times daily as needed (for anxiety).     . Coenzyme Q10 (CO Q 10) 100 MG CAPS Take 100 mg by mouth daily.    . diclofenac sodium (VOLTAREN) 1 % GEL Apply 4 g topically 2 (two) times daily. (Patient taking differently: Apply 4 g topically daily as needed (for pain). ) 100 g 1  . DULoxetine (CYMBALTA) 30 MG capsule Take 60 mg by mouth daily.     . enalapril (VASOTEC) 10 MG tablet Take 10 mg by mouth daily.    Marland Kitchen estradiol (VIVELLE-DOT) 0.05 MG/24HR patch Place 1 patch onto the skin 2 (two) times a week.     . fexofenadine (ALLER-EASE) 180 MG tablet Take 180 mg by mouth daily.    . fluticasone (FLONASE) 50 MCG/ACT nasal spray Place 2 sprays into both nostrils 2 (two) times daily.     . Genistein (I-COOL FOR MENOPAUSE) 30 MG TABS Take 30 mg by mouth every morning.    . latanoprost (XALATAN) 0.005 % ophthalmic solution Place 1 drop into both eyes at bedtime.     Marland Kitchen levothyroxine (SYNTHROID, LEVOTHROID) 112 MCG tablet Take 112 mcg by mouth daily before breakfast.     . metFORMIN (GLUCOPHAGE) 500 MG tablet Take 500 mg by mouth 2 (two) times daily with a meal.     . methocarbamol (ROBAXIN) 500 MG tablet Take 1 tablet (500 mg total) by mouth every 6 (six) hours as needed for muscle spasms. 50 tablet 0  . metoprolol succinate (TOPROL-XL) 25 MG 24 hr tablet TAKE ONE (1) TABLET BY MOUTH EVERY DAY (Patient taking differently: TAKE ONE (1) TABLET (25 mg) BY MOUTH EVERY DAY) 90 tablet 3  . Omega-3 Fatty Acids (FISH OIL ULTRA) 1000 MG CAPS Take 1,000 mg by mouth 2 (two) times daily.     . Oxycodone HCl 10 MG TABS Take 1 tablet (10 mg total) by mouth every 4 (four) hours as needed (for pain). 60 tablet 0  . Red Yeast Rice 600 MG CAPS Take 1,200 mg by mouth 2 (two) times daily.    Marland Kitchen rOPINIRole (REQUIP) 3 MG tablet TAKE ONE (1) TABLET BY MOUTH TWO (2) TIMES DAILY (Patient taking  differently: TAKE ONE (1) TABLET (3 mg) BY MOUTH TWO (2) TIMES DAILY) 60 tablet 2  . traZODone (DESYREL) 100 MG tablet TAKE ONE TO TWO TABLETS BY MOUTH AT BEDTIME FOR SLEEP (Patient taking differently: TAKE ONE TO TWO (100 - 200 mg) TABLETS BY MOUTH AT BEDTIME as needed FOR SLEEP) 60 tablet 6  . gabapentin (NEURONTIN) 300 MG capsule Take 600 mg by mouth 2 (two) times daily.      No facility-administered medications prior to visit.     PAST MEDICAL HISTORY: Past Medical History:  Diagnosis Date  . Colon polyps   . Degenerative arthritis   . Diabetes mellitus without complication (HCC)   . Environmental allergies   . Fatty liver   . Glaucoma   . Headache(784.0)    HX  MIGRAINES  . Hypertension   . Hypothyroidism   .  Obesity   . OSA on CPAP   . Plantar fasciitis    right  . PONV (postoperative nausea and vomiting)   . Renal calculi   . Renal calculi   . RLS (restless legs syndrome) 08/23/2014    PAST SURGICAL HISTORY: Past Surgical History:  Procedure Laterality Date  . ABDOMINAL HYSTERECTOMY    . APPENDECTOMY    . Arthroscopic surgery, knee Left   . BREAST BIOPSY    . BUNIONECTOMY     LEFT   . COLONOSCOPY W/ POLYPECTOMY    . FOOT SURGERY     RIGHT    . KNEE ARTHROSCOPY W/ MENISCAL REPAIR     RIGHT  . LASIK    . MAXIMUM ACCESS (MAS)POSTERIOR LUMBAR INTERBODY FUSION (PLIF) 2 LEVEL N/A 12/13/2015   Procedure: Lumbar two-three - Lumbar three-four MAXIMUM ACCESS (MAS) POSTERIOR LUMBAR INTERBODY FUSION (PLIF)  ;  Surgeon: Tia Alert, MD;  Location: [facility]URO [facility]S;  Service: Neurosurgery;  Laterality: N/A;  . SHOULDER SURGERY     RIGHT   . TONSILLECTOMY      FAMILY HISTORY: Family History  Problem Relation Age of Onset  . Lung cancer Mother   . Congestive Heart Failure Father   . Depression Sister   . Rheum arthritis Sister   . Headache Maternal Grandfather   . Migraines Maternal Grandfather   . Headache Maternal Uncle   . Diabetes    . Heart disease    .  Hypertension      SOCIAL HISTORY: Social History   Social History  . Marital status: Married    Spouse name: Milinda Cave   . Number of children: 0  . Years of education: 12   Occupational History  . retired    Social History Main Topics  . Smoking status: Never Smoker  . Smokeless tobacco: Never Used  . Alcohol use No  . Drug use: No  . Sexual activity: Not on file   Other Topics Concern  . Not on file   Social History Narrative   Patient is married and lives with her husband.   Patient has a high school education.   Patient has no children.    Patient works for the [employer]   Patient is right handed.   Patient drinks occasionally drinks caffeine.      PHYSICAL EXAM  Vitals:   01/22/16 0921  BP: 117/74  Pulse: (!) 59  Weight: 198 lb 6.4 oz (90 kg)  Height: 5' (1.524 m)   Body mass index is 38.75 kg/m.  Generalized: Well developed, in no acute distress   Neurological examination  Mentation: Alert oriented to time, place, history taking. Follows all commands speech and language fluent Cranial nerve II-XII: Pupils were equal round reactive to light. Extraocular movements were full, visual field were full on confrontational test. Facial sensation and strength were normal. Uvula tongue midline. Head turning and shoulder shrug  were normal and symmetric. Motor: The motor testing reveals 5 over 5 strength of all 4 extremities. Good symmetric motor tone is noted throughout.  Sensory: Sensory testing is intact to soft touch on all 4 extremities. No evidence of extinction is noted.  Coordination: Cerebellar testing reveals good finger-nose-finger and heel-to-shin bilaterally.  Gait and station: Gait is normal. Tandem gait not attempted. Romberg is negative. No drift is seen.  Reflexes: Deep tendon reflexes are symmetric and normal bilaterally.   DIAGNOSTIC DATA (LABS, IMAGING, TESTING) - I reviewed patient records, labs, notes, testing and imaging  myself where  available.  Lab Results  Component Value Date   WBC 6.7 12/05/2015   HGB 15.0 12/05/2015   HCT 43.4 12/05/2015   MCV 89.1 12/05/2015   PLT 171 12/05/2015      Component Value Date/Time   NA 140 12/05/2015 0911   NA 138 06/08/2013 1558   K 3.8 12/05/2015 0911   K 4.1 06/08/2013 1558   CL 105 12/05/2015 0911   CL 106 06/08/2013 1558   CO2 27 12/05/2015 0911   CO2 29 06/08/2013 1558   GLUCOSE 162 (H) 12/05/2015 0911   GLUCOSE 93 06/08/2013 1558   BUN 12 12/05/2015 0911   BUN 11 06/08/2013 1558   CREATININE 0.65 12/05/2015 0911   CREATININE 0.64 06/08/2013 1558   CALCIUM 10.1 12/05/2015 0911   CALCIUM 9.6 06/08/2013 1558   PROT 6.6 12/05/2015 0911   PROT 7.5 08/01/2011 1307   ALBUMIN 4.2 12/05/2015 0911   ALBUMIN 4.3 08/01/2011 1307   AST 34 12/05/2015 0911   AST 47 (H) 08/01/2011 1307   ALT 49 12/05/2015 0911   ALT 69 08/01/2011 1307   ALKPHOS 53 12/05/2015 0911   ALKPHOS 69 08/01/2011 1307   BILITOT 0.4 12/05/2015 0911   BILITOT 0.4 08/01/2011 1307   GFRNONAA >60 12/05/2015 0911   GFRNONAA >60 06/08/2013 1558   GFRAA >60 12/05/2015 0911   GFRAA >60 06/08/2013 1558       ASSESSMENT AND PLAN 65 y.o. year old female  has a past medical history of Colon polyps; Degenerative arthritis; Diabetes mellitus without complication (HCC); Environmental allergies; Fatty liver; Glaucoma; Headache(784.0); Hypertension; Hypothyroidism; Obesity; OSA on CPAP; Plantar fasciitis; PONV (postoperative nausea and vomiting); Renal calculi; Renal calculi; and RLS (restless legs syndrome) (08/23/2014). here with:  1. Migraine headaches 2. Restless leg syndrome 3. Back pain  We will increase gabapentin to 800 mg twice a day to help with headaches and restless legs. I did explain that once her back pain is under better control this may also improve her restless leg symptoms as well. She voiced understanding. She has a follow-up with Dr. Yetta Barre next month. She is advised that if her symptoms  worsen or she develops any new symptoms she will let us know.   Butch Penny, MSN, NP-C 01/22/2016, 10:04 AM [facility] [address], [address] [place], [place] [address] [phone]

## 2016-01-28 ENCOUNTER — Encounter: Payer: Self-pay | Admitting: Physical Therapy

## 2016-01-28 ENCOUNTER — Ambulatory Visit: Payer: Medicare HMO | Attending: Neurological Surgery | Admitting: Physical Therapy

## 2016-01-28 DIAGNOSIS — M6281 Muscle weakness (generalized): Secondary | ICD-10-CM | POA: Insufficient documentation

## 2016-01-28 DIAGNOSIS — R262 Difficulty in walking, not elsewhere classified: Secondary | ICD-10-CM | POA: Diagnosis present

## 2016-01-28 DIAGNOSIS — M545 Low back pain, unspecified: Secondary | ICD-10-CM

## 2016-01-29 NOTE — Therapy (Signed)
[place] [facility] [address] [place], [address] Phone: [phone]   Fax:  [phone]  Physical Therapy Evaluation  Patient Details  Name: Jessica Cole MRN: [id number] Date of Birth: August 20, 1950 Referring Provider: Tia Alert MD  Encounter Date: 01/28/2016      PT End of Session - 01/28/16 0902    Visit Number 1   Number of Visits 12   Date for PT Re-Evaluation 03/10/16   Authorization Type 1   Authorization Time Period Gcode 10   PT Start Time [phone]   PT Stop Time 0945   PT Time Calculation (min) 59 min   Activity Tolerance Patient limited by pain;Patient tolerated treatment well   Behavior During Therapy [facility] for tasks assessed/performed      Past Medical History:  Diagnosis Date  . Colon polyps   . Degenerative arthritis   . Diabetes mellitus without complication (HCC)   . Environmental allergies   . Fatty liver   . Glaucoma   . Headache(784.0)    HX  MIGRAINES  . Hypertension   . Hypothyroidism   . Obesity   . OSA on CPAP   . Plantar fasciitis    right  . PONV (postoperative nausea and vomiting)   . Renal calculi   . Renal calculi   . RLS (restless legs syndrome) 08/23/2014    Past Surgical History:  Procedure Laterality Date  . ABDOMINAL HYSTERECTOMY    . APPENDECTOMY    . Arthroscopic surgery, knee Left   . BREAST BIOPSY    . BUNIONECTOMY     LEFT   . COLONOSCOPY W/ POLYPECTOMY    . FOOT SURGERY     RIGHT    . KNEE ARTHROSCOPY W/ MENISCAL REPAIR     RIGHT  . LASIK    . MAXIMUM ACCESS (MAS)POSTERIOR LUMBAR INTERBODY FUSION (PLIF) 2 LEVEL N/A 12/13/2015   Procedure: Lumbar two-three - Lumbar three-four MAXIMUM ACCESS (MAS) POSTERIOR LUMBAR INTERBODY FUSION (PLIF)  ;  Surgeon: Tia Alert, MD;  Location: [facility] NEURO ORS;  Service: Neurosurgery;  Laterality: N/A;  . SHOULDER SURGERY     RIGHT   . TONSILLECTOMY      There were no vitals filed for this visit.        Subjective Assessment - 01/28/16 0910    Subjective Patient reports pain in back and stiffness that is limiting her from performing a lot of household chores, activities. Pain is in back and into both hips/gluteal region   Pertinent History Patient reports progressive worsening back and leg pain over the past year and a half. She had surgery 12/13/2015 for fusion L2-3, L3-4 and has been walking for exercise since surgery. she is trying to be more active.    Limitations Sitting;Standing;Walking;House hold activities;Other (comment)   Patient Stated Goals decrease back pain and be able to perform daily tasks without pain/difficulty   Currently in Pain? 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 (s/p fusion 12/13/15)   Referring Provider Tia AlertJones, David S MD   Onset Date/Surgical Date 12/13/14   Hand Dominance Right   Next MD Visit unknown   Prior Therapy yes, 09/2014 for leg pain, back and leg pain 05/2015, none since surgery     Precautions   Precautions Back   Precaution Booklet Issued No   Precaution Comments s/p lumbar fusion   Required Braces or Orthoses Spinal Brace  wear as needed for support/pain     Restrictions   Weight Bearing Restrictions No     Balance Screen   Has the patient fallen in the past 6 months No   Has the patient had a decrease in activity level because of a fear of falling?  Yes  due to recent surgery   Is the patient reluctant to leave their home because of a fear of falling?  No     Home [employer] information [employer] managernvironment   Living Environment Private  residence   Living Arrangements Spouse/significant other   Home Access Stairs to enter   Entrance Stairs-Number of Steps 6  back entrance   Entrance Stairs-Rails --  both   Home Layout One level     Prior Function   Level of Independence Independent   Vocation Retired   Leisure likes to bake, sew, read, Biomedical engineergardening     Cognition   Overall Cognitive Status Within Functional Limits for tasks assessed     Objective: Observation:  Gait: antalgic, independent without AD, back brace in place, slow cadence, decreased trunk rotation, short step length Transfers; cautious, painful sit to side lying to supine Palpation; lower back with + spasms and tenderness along incision AROM; lumbar spine not tested due to recent surgery LE's hip flexion, knee flexion/extension WFL Sensation; grossly intact to light touch throughout both LE's Outcome measures: 10MW; 14.3 seconds (.8145m/s) in need of intervention Modified oswestry: 68% (severe self perceived disability)   Treatment: Reviewed precautions for lumbar spine fusion: limit bending, twisting, lifting and prolonged sitting Therapeutic exercises: patient performed exercises with guidance, verbal and tactile cues and demonstration of PT: Side ling clamshells with tactile, VC for correct alignment of hip, trunk x 10 Supine lying: hip fall outs with controlled motion through partial ROM x 5 reps each LE; SAQ Sitting: Hip adduction with ball and glute sets x 10 reps Hip abduction with resistive band x 15 reps Ankle pumps Quad setting  Patient response to treatment: demonstrated good understanding and technique with exercises following demonstration and with VC's for proper posture          PT Education - 01/28/16 1026    Education provided Yes   Education Details HEP: back precautions for sitting, lifting, squatting s/p lumbar fusion x 6 weeks post op; exercises: quad sets, ankle pumple, hip adduction with ball and glute sets and TrA  contractions, supine hook lying knee fall outs, SAQ supine lying   Person(s) Educated Patient   Methods Explanation;Demonstration;Verbal cues;Handout  [id number]   Comprehension Verbalized understanding;Returned demonstration;Verbal cues required             PT Long Term Goals - 01/28/16 0945      PT LONG TERM GOAL #1   Title patient will demonstrate improved function with decreased back pain with MODI score of 50% or less by 03/10/2016   Baseline MODI 68% (severe self perceived disability)   Status New     PT LONG TERM GOAL #2   Title Patient  will demonstrate good knowledge of precautions for back and be able to stand and walk > 500' by 02/17/2016 demonstrating improved endurance for community activities   Baseline unable to walk any distance    Status New     PT LONG TERM GOAL #3   Title iimprove to 10 seconds or less indicating improved functional community ambulation   Baseline = 14.3 seconds (.21m/s) decreased for functional community ambulation   Status New     PT LONG TERM GOAL #4   Title Patient will be independent with home exercises without cuing for core control, strengthening to allow self management once discharged from physical therapy 03/10/16   Baseline no knowledge and maximal VC required for guided exercises and progression   Status New               Plan - 02/27/2016 1028    Clinical Impression Statement Patient is a 65 year old right hand dominant female who presents s/p lumbar fusion 12/13/2015. She has significant pain and weakness that limit functional mobility for daily tasks including dressing, walking, sitting, and sleeping. She has 65% impairment baase on modified oswestry and scores, pain levels. She requires guidance for appropriate pain control strategies and exercises to allow her to progress towards improved function with personal care, household chores and community activities.    Rehab Potential Good   Clinical Impairments  Affecting Rehab Potential (+) motivated (-) chronic condition of pain in hip/low back   PT Frequency 2x / week   PT Duration 6 weeks   PT Treatment/Interventions Manual techniques;Electrical Stimulation;Moist Heat;Patient/family education;Cryotherapy;Therapeutic exercise;Dry needling;Ultrasound   PT Next Visit Plan pain control, progress exercises as tolerated   PT Home Exercise Plan home program for stabilization   Consulted and Agree with Plan of Care Patient      Patient will benefit from skilled therapeutic intervention in order to improve the following deficits and impairments:  Pain, Impaired flexibility, Difficulty walking, Increased muscle spasms, Decreased strength, Impaired perceived functional ability, Decreased activity tolerance, Decreased range of motion, Decreased knowledge of precautions  Visit Diagnosis: Bilateral low back pain without sciatica - Plan: PT plan of care cert/re-cert  Muscle weakness (generalized) - Plan: PT plan of care cert/re-cert  Difficulty in walking, not elsewhere classified - Plan: PT plan of care cert/re-cert      G-Codes - February 27, 2016 1000    Functional Assessment Tool Used pain scale, clinical judgment, modified oswestry, strength deficits, ROM   Functional Limitation Mobility: Walking and moving around   Mobility: Walking and Moving Around Current Status ([id number]) At least 60 percent but less than 80 percent impaired, limited or restricted   Mobility: Walking and Moving Around Goal Status [phone]) At least 20 percent but less than 40 percent impaired, limited or restricted       Problem List Patient Active Problem List   Diagnosis Date Noted  . S/P lumbar spinal fusion 12/13/2015  . Controlled type 2 diabetes mellitus without complication (HCC) 05/16/2015  . Thrombocytopenia (HCC) 05/16/2015  . Recurrent major depressive disorder, in full remission (HCC) 05/16/2015  . Essential (primary) hypertension 05/16/2015  . Fatty infiltration of liver  03/15/2015  . Aortic valve stenosis, nonrheumatic 01/18/2015  . Sciatica of right side 01/09/2015  . Type 2 diabetes mellitus (HCC) 11/10/2014  . RLS (restless legs syndrome) 08/23/2014  . Neuritis or radiculitis due to rupture of lumbar intervertebral disc 06/13/2014  . Degeneration of intervertebral disc of lumbar region 06/13/2014  . Arthritis 01/23/2014  .  Arthritis of knee, degenerative 11/15/2013  . Depression 09/29/2013  . Insomnia 09/29/2013  . Apnea, sleep 08/29/2013  . Calculus of kidney 08/29/2013  . Abnormal presence of protein in urine 08/29/2013  . Headache, migraine 08/29/2013  . BP (high blood pressure) 08/29/2013  . HLD (hyperlipidemia) 08/29/2013  . Allergic rhinitis 08/29/2013  . Intractable migraine without aura 02/18/2013    Beacher May PT 01/29/2016, 2:11 PM  [facility] PH[facility] SPORTS MEDICINE [address] [place], [address] Phone: [phone]   Fax:  [phone]  Name: EMIKA TIANO MRN: [id number] Date of Birth: 1951-03-23

## 2016-02-01 ENCOUNTER — Ambulatory Visit: Payer: Medicare HMO | Admitting: Physical Therapy

## 2016-02-04 ENCOUNTER — Ambulatory Visit: Payer: Medicare HMO | Attending: Neurological Surgery | Admitting: Physical Therapy

## 2016-02-04 ENCOUNTER — Encounter: Payer: Self-pay | Admitting: Physical Therapy

## 2016-02-04 DIAGNOSIS — M6281 Muscle weakness (generalized): Secondary | ICD-10-CM | POA: Insufficient documentation

## 2016-02-04 DIAGNOSIS — M545 Low back pain, unspecified: Secondary | ICD-10-CM

## 2016-02-04 DIAGNOSIS — R262 Difficulty in walking, not elsewhere classified: Secondary | ICD-10-CM | POA: Insufficient documentation

## 2016-02-04 NOTE — Therapy (Signed)
[place] [place] [facility] [address], [place], [address] Phone: [phone]   Fax:  [phone]  Physical Therapy Treatment  Patient Details  Name: Jessica Cole MRN: [id number] Date of Birth: 10/28/1950 Referring Provider: Tia Alert MD  Encounter Date: 02/04/2016      PT End of Session - 02/04/16 0957    Visit Number 2   Number of Visits 12   Date for PT Re-Evaluation 03/10/16   Authorization Type 2   Authorization Time Period Gcode 10   PT Start Time [phone]   PT Stop Time 1023   PT Time Calculation (min) 40 min   Activity Tolerance Patient limited by pain;Patient tolerated treatment well   Behavior During Therapy [facility] for tasks assessed/performed      Past Medical History:  Diagnosis Date  . Colon polyps   . Degenerative arthritis   . Diabetes mellitus without complication (HCC)   . Environmental allergies   . Fatty liver   . Glaucoma   . Headache(784.0)    HX  MIGRAINES  . Hypertension   . Hypothyroidism   . Obesity   . OSA on CPAP   . Plantar fasciitis    right  . PONV (postoperative nausea and vomiting)   . Renal calculi   . Renal calculi   . RLS (restless legs syndrome) 08/23/2014    Past Surgical History:  Procedure Laterality Date  . ABDOMINAL HYSTERECTOMY    . APPENDECTOMY    . Arthroscopic surgery, knee Left   . BREAST BIOPSY    . BUNIONECTOMY     LEFT   . COLONOSCOPY W/ POLYPECTOMY    . FOOT SURGERY     RIGHT    . KNEE ARTHROSCOPY W/ MENISCAL REPAIR     RIGHT  . LASIK    . MAXIMUM ACCESS (MAS)POSTERIOR LUMBAR INTERBODY FUSION (PLIF) 2 LEVEL N/A 12/13/2015   Procedure: Lumbar two-three - Lumbar three-four MAXIMUM ACCESS (MAS) POSTERIOR LUMBAR INTERBODY FUSION (PLIF)  ;  Surgeon: Tia Alert, MD;  Location: [facility] NEURO ORS;  Service: Neurosurgery;  Laterality: N/A;  . SHOULDER SURGERY     RIGHT   . TONSILLECTOMY      There were no vitals filed for this visit.       Subjective Assessment - 02/04/16 0944    Subjective patient reports having increased soreness the day after her last session. She is having a good day today. Her restless legs have gotten worse since the surgery and she is having to change her pad more often, having difficulty getting to the bathroom. She is doing a lot at home, stooping and bending too much.   Limitations Sitting;Standing;Walking;House hold activities;Other (comment)   Patient Stated Goals decrease back pain and be able to perform daily tasks without pain/difficulty   Currently in Pain? Yes   Pain Score 2    Pain Location Back   Pain Orientation Lower   Pain Descriptors / Indicators Aching   Pain Type Acute pain;Surgical pain   Pain Onset More than a month ago  12/13/15   Pain Frequency Constant      Objective Gait: guarded posture, wearing back brace on arrival  Treatment: Therapeutic exercise: patient performed exercises with guidance, verbal and tactile cues and demonstration of PT: Patient wearing back brace throughout session sitting exercises: Hip adduction with glute sets x 10 Hip abduction with manual resistance with 5 second holds mild resistance x 10 Standing exercises: On airex pad:  standing sway forward and back  and side to side x 1 min. Each resistive band rows and lat pull downs using red resistive tubing  2 x 15 reps each with demonstration and VC for correct alignment and technique  Patient response to treatment: Patient required repeated verbal and tactile cues for all exercises for core control, TrA contraction with improved technique with repetition. Patient reported soreness in back with most exercises standing and sitting          PT Education - 02/04/16 0948    Education provided Yes   Education Details HEP: re assessed exercises, sitting exercises, standing sway forward and back and side to side, supine marching, resistive band rows and lat pull downs, re addressed no bending and  twisting activities   Person(s) Educated Patient   Methods Explanation;Demonstration;Verbal cues;Handout   Comprehension Verbalized understanding;Returned demonstration;Verbal cues required             PT Long Term Goals - 01/28/16 0945      PT LONG TERM GOAL #1   Title patient will demonstrate improved function with decreased back pain with MODI score of 50% or less by 03/10/2016   Baseline MODI 68% (severe self perceived disability)   Status New     PT LONG TERM GOAL #2   Title Patient  will demonstrate good knowledge of precautions for back and be able to stand and walk > 500' by 02/17/2016 demonstrating improved endurance for community activities   Baseline unable to walk any distance    Status New     PT LONG TERM GOAL #3   Title iimprove to 10 seconds or less indicating improved functional community ambulation   Baseline = 14.3 seconds (.27m/s) decreased for functional community ambulation   Status New     PT LONG TERM GOAL #4   Title Patient will be independent with home exercises without cuing for core control, strengthening to allow self management once discharged from physical therapy 03/10/16   Baseline no knowledge and maximal VC required for guided exercises and progression   Status New               Plan - 02/04/16 2440    Clinical Impression Statement Patient progressing slowly due to pain.  She required repeated cuing for correction of posture and to perform TrA contraction during exercises. She should continue to progress with additional physical therapy intervention to address pain and weakness.   Rehab Potential Good   PT Frequency 2x / week   PT Duration 6 weeks   PT Treatment/Interventions Manual techniques;Electrical Stimulation;Moist Heat;Patient/family education;Cryotherapy;Therapeutic exercise;Dry needling;Ultrasound   PT Next Visit Plan pain control, progress exercises as tolerated, manual therapy techniques to control pain, spasms in  back   PT Home Exercise Plan home program for stabilization      Patient will benefit from skilled therapeutic intervention in order to improve the following deficits and impairments:  Pain, Impaired flexibility, Difficulty walking, Increased muscle spasms, Decreased strength, Impaired perceived functional ability, Decreased activity tolerance, Decreased range of motion, Decreased knowledge of precautions  Visit Diagnosis: Bilateral low back pain without sciatica, unspecified chronicity  Muscle weakness (generalized)  Difficulty in walking, not elsewhere classified     Problem List Patient Active Problem List   Diagnosis Date Noted  . S/P lumbar spinal fusion 12/13/2015  . Controlled type 2 diabetes mellitus without complication (HCC) 05/16/2015  . Thrombocytopenia (HCC) 05/16/2015  . Recurrent major depressive disorder, in full remission (HCC) 05/16/2015  .  Essential (primary) hypertension 05/16/2015  . Fatty infiltration of liver 03/15/2015  . Aortic valve stenosis, nonrheumatic 01/18/2015  . Sciatica of right side 01/09/2015  . Type 2 diabetes mellitus (HCC) 11/10/2014  . RLS (restless legs syndrome) 08/23/2014  . Neuritis or radiculitis due to rupture of lumbar intervertebral disc 06/13/2014  . Degeneration of intervertebral disc of lumbar region 06/13/2014  . Arthritis 01/23/2014  . Arthritis of knee, degenerative 11/15/2013  . Depression 09/29/2013  . Insomnia 09/29/2013  . Apnea, sleep 08/29/2013  . Calculus of kidney 08/29/2013  . Abnormal presence of protein in urine 08/29/2013  . Headache, migraine 08/29/2013  . BP (high blood pressure) 08/29/2013  . HLD (hyperlipidemia) 08/29/2013  . Allergic rhinitis 08/29/2013  . Intractable migraine without aura 02/18/2013    Beacher MayBrooks, Silva Aamodt PT 02/04/2016, 10:30 AM  [facility] [address] [place], [place], [address] Phone: [phone]   Fax:   [phone]  Name: Nobie PutnamGail T Howerton MRN: [id number] Date of Birth: 1950/06/10

## 2016-02-05 ENCOUNTER — Other Ambulatory Visit: Payer: Self-pay | Admitting: Adult Health

## 2016-02-05 NOTE — Telephone Encounter (Addendum)
After speaking with pt again.  She will renew for 800mg  po bid (will take 400mg  po am/ 800mg  pm). She has refills.   RLS not any better.   I told her to give it a month.  She will call back if intolerable.

## 2016-02-05 NOTE — Telephone Encounter (Signed)
To clarify- she is wanting to reduce her medication to 400 mg BID?

## 2016-02-05 NOTE — Telephone Encounter (Signed)
I called and spoke to Dr. Guillermina [place]'s office.  She was able to fax me last ofv note 02-04-16.  Pt relayed that she was taking gabapentin 400mg  po AM and 800mg  po PM.  (taking 800mg  po in am made her too sleepy).  The notes received form Dr. Guillermina [place]'s office, stated the pt reporting taking 400mg  po bid.  Not anything he recommended changing.  Placed note in your in box.   Did you want to change anything?

## 2016-02-05 NOTE — Telephone Encounter (Signed)
Spoke to pt and asked her about note on prescription refill request stating to decrease to 400mg  po bid.  She see's rheumatologist for her arthritis In her hand and Saverio DankerWallace Kernodle, MD recommended decreasing med?  Pt was not sure.  Offered to call them and see what I could find out.

## 2016-02-08 ENCOUNTER — Ambulatory Visit: Payer: Medicare HMO | Admitting: Physical Therapy

## 2016-02-08 ENCOUNTER — Encounter: Payer: Self-pay | Admitting: Physical Therapy

## 2016-02-08 DIAGNOSIS — M6281 Muscle weakness (generalized): Secondary | ICD-10-CM

## 2016-02-08 DIAGNOSIS — M545 Low back pain, unspecified: Secondary | ICD-10-CM

## 2016-02-08 DIAGNOSIS — R262 Difficulty in walking, not elsewhere classified: Secondary | ICD-10-CM

## 2016-02-08 NOTE — Therapy (Signed)
[place] [facility] [address] [place], [address] Phone: [phone]   Fax:  [phone]  Physical Therapy Treatment  Patient Details  Name: Jessica Cole MRN: [id number] Date of Birth: [birth year] Referring Provider: Tia Alert MD  Encounter Date: 02/08/2016      PT End of Session - 02/08/16 1110    Visit Number 3   Number of Visits 12   Date for PT Re-Evaluation 03/10/16   Authorization Type 3   Authorization Time Period Gcode 10   PT Start Time 1026   PT Stop Time 1100   PT Time Calculation (min) 34 min   Activity Tolerance Patient limited by pain;Patient tolerated treatment well   Behavior During Therapy [facility] for tasks assessed/performed      Past Medical History:  Diagnosis Date  . Colon polyps   . Degenerative arthritis   . Diabetes mellitus without complication (HCC)   . Environmental allergies   . Fatty liver   . Glaucoma   . Headache(784.0)    HX  MIGRAINES  . Hypertension   . Hypothyroidism   . Obesity   . OSA on CPAP   . Plantar fasciitis    right  . PONV (postoperative nausea and vomiting)   . Renal calculi   . Renal calculi   . RLS (restless legs syndrome) 08/23/2014    Past Surgical History:  Procedure Laterality Date  . ABDOMINAL HYSTERECTOMY    . APPENDECTOMY    . Arthroscopic surgery, knee Left   . BREAST BIOPSY    . BUNIONECTOMY     LEFT   . COLONOSCOPY W/ POLYPECTOMY    . FOOT SURGERY     RIGHT    . KNEE ARTHROSCOPY W/ MENISCAL REPAIR     RIGHT  . LASIK    . MAXIMUM ACCESS (MAS)POSTERIOR LUMBAR INTERBODY FUSION (PLIF) 2 LEVEL N/A 12/13/2015   Procedure: Lumbar two-three - Lumbar three-four MAXIMUM ACCESS (MAS) POSTERIOR LUMBAR INTERBODY FUSION (PLIF)  ;  Surgeon: Tia Alert, MD;  Location: [facility] NEURO ORS;  Service: Neurosurgery;  Laterality: N/A;  . SHOULDER SURGERY     RIGHT   . TONSILLECTOMY      There were no vitals filed for this visit.       Subjective Assessment - 02/08/16 1030    Subjective Patient reports pain in lower back, soreness and spasms. She reports she may be bending and sitting a little too much still and this may be aggravating her symptoms. She also reports having migraines more recently with increased frequency and has one on arrival to therapy today.   Limitations Sitting;Standing;Walking;House hold activities;Other (comment)   Patient Stated Goals decrease back pain and be able to perform daily tasks without pain/difficulty   Currently in Pain? Yes   Pain Score 3    Pain Location Back   Pain Orientation Lower   Pain Descriptors / Indicators Aching  intermittent sharp pains in back   Pain Type Acute pain;Surgical pain   Pain Onset More than a month ago  12/13/15   Pain Frequency Constant      Objective Gait:  Decreased trunk rotation, guarded posture Palpation: + spasms along right side lumbar paraspinals with tenderness  Treatment: Therapeutic exercise: patient performed exercises with guidance, verbal and tactile cues and demonstration of PT: sitting exercises: Hip adduction with ball and  glute sets x 10 Hip abduction with manual resistance with 5 second holds mild resistance x 10 Ankle  DF x 15 reps each Ankle PF x 15 reps each 2# weights on ankles roll ball under each foot for knee flexion and extension with core control 25 reps each LE resistive band rows and lat pull downs using green resistive band  2 x 15 reps each with demonstration and VC for correct alignment and technique Seated bilateral flexion with ball up overhead x 10 reps with effort and reported pulling in back (stretching feeling)  Modalities: moist heat applied to bilateral upper trapezius muscles and to back with patient seated in chair while performing LE exercises x 20 min., no adverse reaction noted goal: pain control  Patient response to treatment: Patient demonstrated good technique, posture with all exercises with VC and  guidance of therapist for proper positioning of UE's, LE's during exercises. Patient required repeated VC during all exercises to maintain core control.         PT Education - 02/08/16 1115    Education provided Yes   Education Details HEP: core control exercises   Person(s) Educated Patient   Methods Explanation;Demonstration;Verbal cues   Comprehension Verbalized understanding;Returned demonstration;Verbal cues required             PT Long Term Goals - 01/28/16 0945      PT LONG TERM GOAL #1   Title patient will demonstrate improved function with decreased back pain with MODI score of 50% or less by 03/10/2016   Baseline MODI 68% (severe self perceived disability)   Status New     PT LONG TERM GOAL #2   Title Patient  will demonstrate good knowledge of precautions for back and be able to stand and walk > 500' by 02/17/2016 demonstrating improved endurance for community activities   Baseline unable to walk any distance    Status New     PT LONG TERM GOAL #3   Title iimprove 10MW to 10 seconds or less indicating improved functional community ambulation   Baseline 10MW = 14.3 seconds (.3542m/s) decreased for functional community ambulation   Status New     PT LONG TERM GOAL #4   Title Patient will be independent with home exercises without cuing for core control, strengthening to allow self management once discharged from physical therapy 03/10/16   Baseline no knowledge and maximal VC required for guided exercises and progression   Status New               Plan - 02/08/16 1120    Clinical Impression Statement Patient with minimal carry over between sessions due to continued pain. Limited session today due to migraine pain. Patient requires cuing for core control and to maintain proper positioning, postures during exercises due to decreased strength and motor control     Rehab Potential Good   PT Frequency 2x / week   PT Duration 6 weeks   PT  Treatment/Interventions Manual techniques;Electrical Stimulation;Moist Heat;Patient/family education;Cryotherapy;Therapeutic exercise;Dry needling;Ultrasound   PT Next Visit Plan pain control, progress exercises as tolerated   PT Home Exercise Plan home program for stabilization      Patient will benefit from skilled therapeutic intervention in order to improve the following deficits and impairments:  Pain, Impaired flexibility, Difficulty walking, Increased muscle spasms, Decreased strength, Impaired perceived functional ability, Decreased activity tolerance, Decreased range of motion, Decreased knowledge of precautions  Visit Diagnosis: Bilateral low back pain without sciatica, unspecified chronicity  Muscle weakness (generalized)  Difficulty in walking, not elsewhere classified     Problem List Patient Active Problem List  Diagnosis Date Noted  . S/P lumbar spinal fusion 12/13/2015  . Controlled type 2 diabetes mellitus without complication (HCC) 05/16/2015  . Thrombocytopenia (HCC) 05/16/2015  . Recurrent major depressive disorder, in full remission (HCC) 05/16/2015  . Essential (primary) hypertension 05/16/2015  . Fatty infiltration of liver 03/15/2015  . Aortic valve stenosis, nonrheumatic 01/18/2015  . Sciatica of right side 01/09/2015  . Type 2 diabetes mellitus (HCC) 11/10/2014  . RLS (restless legs syndrome) 08/23/2014  . Neuritis or radiculitis due to rupture of lumbar intervertebral disc 06/13/2014  . Degeneration of intervertebral disc of lumbar region 06/13/2014  . Arthritis 01/23/2014  . Arthritis of knee, degenerative 11/15/2013  . Depression 09/29/2013  . Insomnia 09/29/2013  . Apnea, sleep 08/29/2013  . Calculus of kidney 08/29/2013  . Abnormal presence of protein in urine 08/29/2013  . Headache, migraine 08/29/2013  . BP (high blood pressure) 08/29/2013  . HLD (hyperlipidemia) 08/29/2013  . Allergic rhinitis 08/29/2013  . Intractable migraine without  aura 02/18/2013    Beacher May PT 02/08/2016, 11:14 PM  [place] [facility] PH[facility] SPORTS MEDICINE [address], [place], [address] Phone: [phone]   Fax:  [phone]  Name: Jessica Cole MRN: [id number] Date of Birth: 1951-03-15

## 2016-02-09 ENCOUNTER — Other Ambulatory Visit: Payer: Self-pay | Admitting: Neurology

## 2016-02-13 ENCOUNTER — Ambulatory Visit: Payer: Medicare HMO | Admitting: Physical Therapy

## 2016-02-13 ENCOUNTER — Encounter: Payer: Self-pay | Admitting: Physical Therapy

## 2016-02-13 DIAGNOSIS — M545 Low back pain, unspecified: Secondary | ICD-10-CM

## 2016-02-13 DIAGNOSIS — M6281 Muscle weakness (generalized): Secondary | ICD-10-CM

## 2016-02-13 DIAGNOSIS — R262 Difficulty in walking, not elsewhere classified: Secondary | ICD-10-CM

## 2016-02-14 ENCOUNTER — Telehealth: Payer: Self-pay | Admitting: Neurology

## 2016-02-14 NOTE — Telephone Encounter (Addendum)
Patient called to advise, since back surgery and off pain medication, "can he send something in for migraine and RLS"? Also adds that she is on a new medication for arthritis (Sulindac 200 MG takes twice a day).

## 2016-02-14 NOTE — Therapy (Signed)
[place] [facility] [address], [place], [address] Phone: [phone]   Fax:  [phone]  Physical Therapy Treatment  Patient Details  Name: Jessica Cole MRN: [id number] Date of Birth: May 11, 1950 Referring Provider: Tia Alert MD  Encounter Date: 02/13/2016      PT End of Session - 02/13/16 1907    Visit Number 4   Number of Visits 12   Date for PT Re-Evaluation 03/10/16   Authorization Type 4   Authorization Time Period Gcode 10   PT Start Time 1858   PT Stop Time 1932   PT Time Calculation (min) 34 min   Activity Tolerance Patient limited by pain;Patient tolerated treatment well   Behavior During Therapy [facility] for tasks assessed/performed      Past Medical History:  Diagnosis Date  . Colon polyps   . Degenerative arthritis   . Diabetes mellitus without complication (HCC)   . Environmental allergies   . Fatty liver   . Glaucoma   . Headache(784.0)    HX  MIGRAINES  . Hypertension   . Hypothyroidism   . Obesity   . OSA on CPAP   . Plantar fasciitis    right  . PONV (postoperative nausea and vomiting)   . Renal calculi   . Renal calculi   . RLS (restless legs syndrome) 08/23/2014    Past Surgical History:  Procedure Laterality Date  . ABDOMINAL HYSTERECTOMY    . APPENDECTOMY    . Arthroscopic surgery, knee Left   . BREAST BIOPSY    . BUNIONECTOMY     LEFT   . COLONOSCOPY W/ POLYPECTOMY    . FOOT SURGERY     RIGHT    . KNEE ARTHROSCOPY W/ MENISCAL REPAIR     RIGHT  . LASIK    . MAXIMUM ACCESS (MAS)POSTERIOR LUMBAR INTERBODY FUSION (PLIF) 2 LEVEL N/A 12/13/2015   Procedure: Lumbar two-three - Lumbar three-four MAXIMUM ACCESS (MAS) POSTERIOR LUMBAR INTERBODY FUSION (PLIF)  ;  Surgeon: Tia Alert, MD;  Location: [facility] NEURO ORS;  Service: Neurosurgery;  Laterality: N/A;  . SHOULDER SURGERY     RIGHT   . TONSILLECTOMY      There were no vitals filed for this visit.       Subjective Assessment - 02/13/16 1901    Subjective Patient reports she got a good report from MD from surgery and can now vacuum, sweep and lift up to 15#.    Limitations Sitting;Standing;Walking;House hold activities;Other (comment)   Patient Stated Goals decrease back pain and be able to perform daily tasks without pain/difficulty   Currently in Pain? No/denies        Objective Gait: improved gait pattern, increased trunk rotation  Treatment: Therapeutic exercise: patient performed exercises with guidance, verbal and tactile cues and demonstration of PT: sitting exercises: Hip adduction with ball and  glute sets x 10, required repeated instruction to decreased intensity to avoid increased lower back pain Hip abduction with manual resistance with 5 second holds mild resistance x 10 Ankle DF x 15 reps each Ankle PF x 15 reps each 2# weights on ankles roll ball under each foot for knee flexion and extension with core control 25 reps each LE At OMEGA: Seated in chair: bilateral scapular rows 10# x 15 reps Lat straight arm pull downs 10# x 15 reps Reverse chin ups 10# x 10 reps  Patient response to treatment: Patient performed exercises with verbal cues, demonstration with  good technique Patient requires guidance and VC with all exercises to maintain core control.        PT Education - 02/13/16 1905    Education provided Yes   Education Details HEP: work on activities to encourage muscle activation without a lot of intensity   Person(s) Educated Patient   Methods Explanation;Demonstration;Verbal cues   Comprehension Verbalized understanding;Returned demonstration;Verbal cues required             PT Long Term Goals - 01/28/16 0945      PT LONG TERM GOAL #1   Title patient will demonstrate improved function with decreased back pain with MODI score of 50% or less by 03/10/2016   Baseline MODI 68% (severe self perceived disability)   Status New     PT LONG TERM GOAL  #2   Title Patient  will demonstrate good knowledge of precautions for back and be able to stand and walk > 500' by 02/17/2016 demonstrating improved endurance for community activities   Baseline unable to walk any distance    Status New     PT LONG TERM GOAL #3   Title iimprove 10MW to 10 seconds or less indicating improved functional community ambulation   Baseline 10MW = 14.3 seconds (.4325m/s) decreased for functional community ambulation   Status New     PT LONG TERM GOAL #4   Title Patient will be independent with home exercises without cuing for core control, strengthening to allow self management once discharged from physical therapy 03/10/16   Baseline no knowledge and maximal VC required for guided exercises and progression   Status New               Plan - 02/13/16 1945    Clinical Impression Statement Patient able to perform advanced exercise for strengthening with mild pain and fatigue. she conitnues with weakness and back pain that limit full function with daily tasks and will require additional physical therapy intervention to achieve goals.   Rehab Potential Good   PT Frequency 2x / week   PT Duration 6 weeks   PT Treatment/Interventions Manual techniques;Electrical Stimulation;Moist Heat;Patient/family education;Cryotherapy;Therapeutic exercise;Dry needling;Ultrasound   PT Next Visit Plan pain control, progress exercises as tolerated   PT Home Exercise Plan home program for stabilization      Patient will benefit from skilled therapeutic intervention in order to improve the following deficits and impairments:  Pain, Impaired flexibility, Difficulty walking, Increased muscle spasms, Decreased strength, Impaired perceived functional ability, Decreased activity tolerance, Decreased range of motion, Decreased knowledge of precautions  Visit Diagnosis: Bilateral low back pain without sciatica, unspecified chronicity  Muscle weakness (generalized)  Difficulty in  walking, not elsewhere classified     Problem List Patient Active Problem List   Diagnosis Date Noted  . S/P lumbar spinal fusion 12/13/2015  . Controlled type 2 diabetes mellitus without complication (HCC) 05/16/2015  . Thrombocytopenia (HCC) 05/16/2015  . Recurrent major depressive disorder, in full remission (HCC) 05/16/2015  . Essential (primary) hypertension 05/16/2015  . Fatty infiltration of liver 03/15/2015  . Aortic valve stenosis, nonrheumatic 01/18/2015  . Sciatica of right side 01/09/2015  . Type 2 diabetes mellitus (HCC) 11/10/2014  . RLS (restless legs syndrome) 08/23/2014  . Neuritis or radiculitis due to rupture of lumbar intervertebral disc 06/13/2014  . Degeneration of intervertebral disc of lumbar region 06/13/2014  . Arthritis 01/23/2014  . Arthritis of knee, degenerative 11/15/2013  . Depression 09/29/2013  . Insomnia 09/29/2013  . Apnea, sleep 08/29/2013  . Calculus  of kidney 08/29/2013  . Abnormal presence of protein in urine 08/29/2013  . Headache, migraine 08/29/2013  . BP (high blood pressure) 08/29/2013  . HLD (hyperlipidemia) 08/29/2013  . Allergic rhinitis 08/29/2013  . Intractable migraine without aura 02/18/2013    Beacher May PT 02/14/2016, 10:52 PM  [place] [facility] PHYSICAL AND SPORTS MEDICINE [address] [place], [address] Phone: [phone]   Fax:  [phone]  Name: VARIE MACHAMER MRN: [id number] Date of Birth: 09-29-1950

## 2016-02-15 ENCOUNTER — Encounter: Payer: Self-pay | Admitting: Physical Therapy

## 2016-02-15 ENCOUNTER — Ambulatory Visit: Payer: Medicare HMO | Admitting: Physical Therapy

## 2016-02-15 DIAGNOSIS — M545 Low back pain, unspecified: Secondary | ICD-10-CM

## 2016-02-15 DIAGNOSIS — M6281 Muscle weakness (generalized): Secondary | ICD-10-CM

## 2016-02-15 MED ORDER — ROPINIROLE HCL 4 MG PO TABS: 4.0000 mg | ORAL_TABLET | Freq: Two times a day (BID) | ORAL | 3 refills | Status: DC

## 2016-02-15 NOTE — Therapy (Signed)
[place] [facility] [address] [place], [address] Phone: [phone]   Fax:  [phone]  Physical Therapy Treatment  Patient Details  Name: Jessica Cole MRN: [id number] Date of Birth: Jul 31, 1950 Referring Provider: Tia Alert MD  Encounter Date: 02/15/2016      PT End of Session - 02/15/16 1000    Visit Number 5   Number of Visits 12   Date for PT Re-Evaluation 03/10/16   Authorization Type 5   Authorization Time Period Gcode 10   PT Start Time [phone]   PT Stop Time 0930   PT Time Calculation (min) 35 min   Activity Tolerance Patient tolerated treatment well   Behavior During Therapy [facility] for tasks assessed/performed      Past Medical History:  Diagnosis Date  . Colon polyps   . Degenerative arthritis   . Diabetes mellitus without complication (HCC)   . Environmental allergies   . Fatty liver   . Glaucoma   . Headache(784.0)    HX  MIGRAINES  . Hypertension   . Hypothyroidism   . Obesity   . OSA on CPAP   . Plantar fasciitis    right  . PONV (postoperative nausea and vomiting)   . Renal calculi   . Renal calculi   . RLS (restless legs syndrome) 08/23/2014    Past Surgical History:  Procedure Laterality Date  . ABDOMINAL HYSTERECTOMY    . APPENDECTOMY    . Arthroscopic surgery, knee Left   . BREAST BIOPSY    . BUNIONECTOMY     LEFT   . COLONOSCOPY W/ POLYPECTOMY    . FOOT SURGERY     RIGHT    . KNEE ARTHROSCOPY W/ MENISCAL REPAIR     RIGHT  . LASIK    . MAXIMUM ACCESS (MAS)POSTERIOR LUMBAR INTERBODY FUSION (PLIF) 2 LEVEL N/A 12/13/2015   Procedure: Lumbar two-three - Lumbar three-four MAXIMUM ACCESS (MAS) POSTERIOR LUMBAR INTERBODY FUSION (PLIF)  ;  Surgeon: Tia Alert, MD;  Location: [facility] NEURO ORS;  Service: Neurosurgery;  Laterality: N/A;  . SHOULDER SURGERY     RIGHT   . TONSILLECTOMY      There were no vitals filed for this visit.      Subjective Assessment - 02/15/16  0856    Subjective sore, right side lower back like sciatica. Overall patient feels she is getting stronger and able to do more with less pain and dificulty   Limitations Sitting;Standing;Walking;House hold activities;Other (comment)   Patient Stated Goals decrease back pain and be able to perform daily tasks without pain/difficulty   Currently in Pain? No/denies      Objective Gait: improved trunk rotation, increased cadence  Treatment: Therapeutic exercise: patient performed exercises with guidance, verbal and tactile cues and demonstration of PT: sitting exercises: Hip adduction with ball and glute sets x 10 Hip abduction with manual resistance with 5 second holds mild resistance x 10 Sitting on stability ball with base for support: bilateral scapular rows with green resistive band x 15 reps Lat straight arm pull downs with green resistive band  x 15 reps Reverse chin ups with green resistive band x 15 reps Hip flexion with core control x 10 reps each LE Weight shifting with core control forward and back and side to side x 15 reps each  Patient response to treatment: Patient demonstrated improved technique with exercises with repetition and VC, improved ability to maintain core control with less VC  than previous session, mild increased pain reported with exercises, no worse at end of session         PT Long Term Goals - 01/28/16 0945      PT LONG TERM GOAL #1   Title patient will demonstrate improved function with decreased back pain with MODI score of 50% or less by 03/10/2016   Baseline MODI 68% (severe self perceived disability)   Status New     PT LONG TERM GOAL #2   Title Patient  will demonstrate good knowledge of precautions for back and be able to stand and walk > 500' by 02/17/2016 demonstrating improved endurance for community activities   Baseline unable to walk any distance    Status New     PT LONG TERM GOAL #3   Title iimprove to 10 seconds or less  indicating improved functional community ambulation   Baseline = 14.3 seconds (.34m/s) decreased for functional community ambulation   Status New     PT LONG TERM GOAL #4   Title Patient will be independent with home exercises without cuing for core control, strengthening to allow self management once discharged from physical therapy 03/10/16   Baseline no knowledge and maximal VC required for guided exercises and progression   Status New               Plan - 02/15/16 0946    Clinical Impression Statement Patient able to perform exercises with decreased back pain today. She is advancing exercises with guided instruction.    Rehab Potential Good   PT Frequency 2x / week   PT Duration 6 weeks   PT Treatment/Interventions Manual techniques;Electrical Stimulation;Moist Heat;Patient/family education;Cryotherapy;Therapeutic exercise;Dry needling;Ultrasound   PT Next Visit Plan pain control, progress exercises as tolerated   PT Home Exercise Plan home program for stabilization      Patient will benefit from skilled therapeutic intervention in order to improve the following deficits and impairments:  Pain, Impaired flexibility, Difficulty walking, Increased muscle spasms, Decreased strength, Impaired perceived functional ability, Decreased activity tolerance, Decreased range of motion, Decreased knowledge of precautions  Visit Diagnosis: Muscle weakness (generalized)  Bilateral low back pain without sciatica, unspecified chronicity     Problem List Patient Active Problem List   Diagnosis Date Noted  . S/P lumbar spinal fusion 12/13/2015  . Controlled type 2 diabetes mellitus without complication (HCC) 05/16/2015  . Thrombocytopenia (HCC) 05/16/2015  . Recurrent major depressive disorder, in full remission (HCC) 05/16/2015  . Essential (primary) hypertension 05/16/2015  . Fatty infiltration of liver 03/15/2015  . Aortic valve stenosis, nonrheumatic 01/18/2015  . Sciatica  of right side 01/09/2015  . Type 2 diabetes mellitus (HCC) 11/10/2014  . RLS (restless legs syndrome) 08/23/2014  . Neuritis or radiculitis due to rupture of lumbar intervertebral disc 06/13/2014  . Degeneration of intervertebral disc of lumbar region 06/13/2014  . Arthritis 01/23/2014  . Arthritis of knee, degenerative 11/15/2013  . Depression 09/29/2013  . Insomnia 09/29/2013  . Apnea, sleep 08/29/2013  . Calculus of kidney 08/29/2013  . Abnormal presence of protein in urine 08/29/2013  . Headache, migraine 08/29/2013  . BP (high blood pressure) 08/29/2013  . HLD (hyperlipidemia) 08/29/2013  . Allergic rhinitis 08/29/2013  . Intractable migraine without aura 02/18/2013    Beacher May PT 02/15/2016, 9:56 PM  [place] [facility] PHYS[facility] AND SPORTS MEDICINE [address] [place], [address] Phone: [phone]   Fax:  [phone]  Name: VILMA WILL MRN: [id number]  Date of Birth: 1951/02/02

## 2016-02-15 NOTE — Addendum Note (Signed)
Addended by: Stephanie AcreWILLIS, Khanh Cordner on: 02/15/2016 01:29 PM   Modules accepted: Orders

## 2016-02-15 NOTE — Telephone Encounter (Signed)
I called patient. She is having increased restless leg symptoms following her low back surgery. She is on gabapentin taking 800 mg twice daily, Requip 3 mg twice daily, and she also has oxycodone today. I will increase her Requip taking 4 mg twice daily, if the symptoms persist, we will consider getting her in for IV iron therapy.

## 2016-02-19 ENCOUNTER — Ambulatory Visit: Payer: Medicare HMO | Admitting: Physical Therapy

## 2016-02-19 ENCOUNTER — Other Ambulatory Visit
Admission: RE | Admit: 2016-02-19 | Discharge: 2016-02-19 | Disposition: A | Discharge status: Home / Self Care | Payer: Medicare HMO | Source: Ambulatory Visit | Attending: Internal Medicine | Admitting: Internal Medicine

## 2016-02-19 ENCOUNTER — Other Ambulatory Visit: Payer: Self-pay | Admitting: Internal Medicine

## 2016-02-19 ENCOUNTER — Encounter: Payer: Self-pay | Admitting: Physical Therapy

## 2016-02-19 DIAGNOSIS — M545 Low back pain, unspecified: Secondary | ICD-10-CM

## 2016-02-19 DIAGNOSIS — M6281 Muscle weakness (generalized): Secondary | ICD-10-CM

## 2016-02-19 DIAGNOSIS — R0600 Dyspnea, unspecified: Secondary | ICD-10-CM | POA: Diagnosis present

## 2016-02-19 DIAGNOSIS — N6310 Unspecified lump in the right breast, unspecified quadrant: Secondary | ICD-10-CM

## 2016-02-19 LAB — FIBRIN DERIVATIVES D-DIMER (ARMC ONLY): Fibrin derivatives D-dimer (ARMC): 786 — ABNORMAL HIGH (ref 0–499)

## 2016-02-19 NOTE — Therapy (Signed)
[place] [facility] [address] [place], [address] Phone: [phone]   Fax:  [phone]  Physical Therapy Treatment  Patient Details  Name: Jessica Cole MRN: [id number] Date of Birth: 1950/08/10 Referring Provider: Tia Alert MD  Encounter Date: 02/19/2016      PT End of Session - 02/19/16 1110    Visit Number 6   Number of Visits 12   Date for PT Re-Evaluation 03/10/16   Authorization Type 6   Authorization Time Period Gcode 10   PT Start Time 1030   PT Stop Time 1100   PT Time Calculation (min) 30 min   Activity Tolerance Patient tolerated treatment well;Patient limited by pain   Behavior During Therapy [facility] for tasks assessed/performed      Past Medical History:  Diagnosis Date  . Colon polyps   . Degenerative arthritis   . Diabetes mellitus without complication (HCC)   . Environmental allergies   . Fatty liver   . Glaucoma   . Headache(784.0)    HX  MIGRAINES  . Hypertension   . Hypothyroidism   . Obesity   . OSA on CPAP   . Plantar fasciitis    right  . PONV (postoperative nausea and vomiting)   . Renal calculi   . Renal calculi   . RLS (restless legs syndrome) 08/23/2014    Past Surgical History:  Procedure Laterality Date  . ABDOMINAL HYSTERECTOMY    . APPENDECTOMY    . Arthroscopic surgery, knee Left   . BREAST BIOPSY    . BUNIONECTOMY     LEFT   . COLONOSCOPY W/ POLYPECTOMY    . FOOT SURGERY     RIGHT    . KNEE ARTHROSCOPY W/ MENISCAL REPAIR     RIGHT  . LASIK    . MAXIMUM ACCESS (MAS)POSTERIOR LUMBAR INTERBODY FUSION (PLIF) 2 LEVEL N/A 12/13/2015   Procedure: Lumbar two-three - Lumbar three-four MAXIMUM ACCESS (MAS) POSTERIOR LUMBAR INTERBODY FUSION (PLIF)  ;  Surgeon: Tia Alert, MD;  Location: [facility] NEURO ORS;  Service: Neurosurgery;  Laterality: N/A;  . SHOULDER SURGERY     RIGHT   . TONSILLECTOMY      There were no vitals filed for this visit.       Subjective Assessment - 02/19/16 1041    Subjective Patient reports she has been dealing with restless leg syndrome exacerbation Sunday into Monday and had to call MD who changed her medication doses and added anti nausea medication for a couple of days as needed.  She has not bee able to exercise because of this exacerbation of symptoms. Today she is feeling a little better and feels weak (was going to call out sick but changed her mind)   Limitations Sitting;Standing;Walking;House hold activities;Other (comment)   Patient Stated Goals decrease back pain and be able to perform daily tasks without pain/difficulty   Currently in Pain? No/denies  No pain, a different sensation in right lower back into hip region and sore in both thighs      Objective Gait: slow cadence, decreased trunk rotation Observation: intermittent spasms in back with patient voicing catching in back symptoms  Treatment: Therapeutic exercise: patient performed exercises with guidance, verbal and tactile cues and demonstration of PT: sitting exercises: Hip adduction with ball and glute sets x 10 Sitting on stability ball with base for support: bilateral scapular rows with green resistive band x 15 reps Lat straight arm pull downs with green  resistive band  x 15 reps Reverse chin ups with green resistive band 2 x 15 reps Hip flexion with core control x 10 reps each LE Weight shifting with core control forward and back and side to side x 1 min. each  Patient response to treatment: Patient with intermittent spasms in back during session, limiting exercises performed. No worse at end of session. Patient demonstrated improved ability to stabilize on ball today as compared to previous session. Required VC and demonstration for all exercises.        PT Education - 02/19/16 1110    Education provided Yes   Education Details HEP: using physioball for exercises at home    Person(s) Educated Patient   Methods  Explanation;Demonstration   Comprehension Verbalized understanding;Returned demonstration;Verbal cues required             PT Long Term Goals - 01/28/16 0945      PT LONG TERM GOAL #1   Title patient will demonstrate improved function with decreased back pain with MODI score of 50% or less by 03/10/2016   Baseline MODI 68% (severe self perceived disability)   Status New     PT LONG TERM GOAL #2   Title Patient  will demonstrate good knowledge of precautions for back and be able to stand and walk > 500' by 02/17/2016 demonstrating improved endurance for community activities   Baseline unable to walk any distance    Status New     PT LONG TERM GOAL #3   Title iimprove 10MW to 10 seconds or less indicating improved functional community ambulation   Baseline 10MW = 14.3 seconds (.3133m/s) decreased for functional community ambulation   Status New     PT LONG TERM GOAL #4   Title Patient will be independent with home exercises without cuing for core control, strengthening to allow self management once discharged from physical therapy 03/10/16   Baseline no knowledge and maximal VC required for guided exercises and progression   Status New               Plan - 02/19/16 1101    Clinical Impression Statement Paitent able to perform exercises with improved control with all exercises. She has intermittent spasms in back which limited exercises/reps performed. She will benefit from additional physical therapy intervention to further strengthen and achieve goals.    Rehab Potential Good   PT Frequency 2x / week   PT Duration 6 weeks   PT Treatment/Interventions Manual techniques;Electrical Stimulation;Moist Heat;Patient/family education;Cryotherapy;Therapeutic exercise;Dry needling;Ultrasound   PT Next Visit Plan pain control, progress exercises as tolerated   PT Home Exercise Plan home program for stabilization      Patient will benefit from skilled therapeutic intervention in  order to improve the following deficits and impairments:  Pain, Impaired flexibility, Difficulty walking, Increased muscle spasms, Decreased strength, Impaired perceived functional ability, Decreased activity tolerance, Decreased range of motion, Decreased knowledge of precautions  Visit Diagnosis: Muscle weakness (generalized)  Bilateral low back pain without sciatica, unspecified chronicity     Problem List Patient Active Problem List   Diagnosis Date Noted  . S/P lumbar spinal fusion 12/13/2015  . Controlled type 2 diabetes mellitus without complication (HCC) 05/16/2015  . Thrombocytopenia (HCC) 05/16/2015  . Recurrent major depressive disorder, in full remission (HCC) 05/16/2015  . Essential (primary) hypertension 05/16/2015  . Fatty infiltration of liver 03/15/2015  . Aortic valve stenosis, nonrheumatic 01/18/2015  . Sciatica of right side 01/09/2015  . Type 2 diabetes mellitus (HCC)  11/10/2014  . RLS (restless legs syndrome) 08/23/2014  . Neuritis or radiculitis due to rupture of lumbar intervertebral disc 06/13/2014  . Degeneration of intervertebral disc of lumbar region 06/13/2014  . Arthritis 01/23/2014  . Arthritis of knee, degenerative 11/15/2013  . Depression 09/29/2013  . Insomnia 09/29/2013  . Apnea, sleep 08/29/2013  . Calculus of kidney 08/29/2013  . Abnormal presence of protein in urine 08/29/2013  . Headache, migraine 08/29/2013  . BP (high blood pressure) 08/29/2013  . HLD (hyperlipidemia) 08/29/2013  . Allergic rhinitis 08/29/2013  . Intractable migraine without aura 02/18/2013    Beacher May PT 02/19/2016, 11:04 PM  [place] [facility] PHYSICAL AND SPORTS MEDICINE [address], [place], [address] Phone: [phone]   Fax:  [phone]  Name: Jessica Cole MRN: [id number] Date of Birth: November 09, 1950

## 2016-02-20 ENCOUNTER — Ambulatory Visit
Admission: RE | Admit: 2016-02-20 | Discharge: 2016-02-20 | Disposition: A | Discharge status: Home / Self Care | Payer: Medicare HMO | Source: Ambulatory Visit | Attending: Internal Medicine | Admitting: Internal Medicine

## 2016-02-20 ENCOUNTER — Ambulatory Visit: Admission: RE | Admit: 2016-02-20 | Payer: Medicare HMO | Source: Ambulatory Visit

## 2016-02-20 ENCOUNTER — Other Ambulatory Visit: Payer: Self-pay | Admitting: Internal Medicine

## 2016-02-20 DIAGNOSIS — R0600 Dyspnea, unspecified: Secondary | ICD-10-CM

## 2016-02-20 DIAGNOSIS — J90 Pleural effusion, not elsewhere classified: Secondary | ICD-10-CM | POA: Insufficient documentation

## 2016-02-20 DIAGNOSIS — R7989 Other specified abnormal findings of blood chemistry: Secondary | ICD-10-CM | POA: Diagnosis not present

## 2016-02-20 DIAGNOSIS — I700 Atherosclerosis of aorta: Secondary | ICD-10-CM | POA: Insufficient documentation

## 2016-02-20 DIAGNOSIS — I517 Cardiomegaly: Secondary | ICD-10-CM | POA: Insufficient documentation

## 2016-02-20 MED ORDER — IOPAMIDOL (ISOVUE-370) INJECTION 76%
75.0000 mL | Freq: Once | INTRAVENOUS | Status: AC | PRN
  Administered 2016-02-20: 75 mL via INTRAVENOUS

## 2016-02-22 ENCOUNTER — Encounter: Payer: Self-pay | Admitting: Physical Therapy

## 2016-02-22 ENCOUNTER — Encounter: Payer: Medicare HMO | Admitting: Physical Therapy

## 2016-02-22 ENCOUNTER — Ambulatory Visit: Payer: Medicare HMO | Admitting: Physical Therapy

## 2016-02-22 DIAGNOSIS — R262 Difficulty in walking, not elsewhere classified: Secondary | ICD-10-CM

## 2016-02-22 DIAGNOSIS — M545 Low back pain, unspecified: Secondary | ICD-10-CM

## 2016-02-22 DIAGNOSIS — M6281 Muscle weakness (generalized): Secondary | ICD-10-CM

## 2016-02-22 NOTE — Therapy (Signed)
[place] [facility] [address], [place], [address] Phone: [phone]   Fax:  [phone]  Physical Therapy Treatment  Patient Details  Name: Jessica Cole MRN: [id number] Date of Birth: [birth year] Referring Provider: Tia Alert MD  Encounter Date: 02/22/2016      PT End of Session - 02/22/16 1155    Visit Number 7   Number of Visits 12   Date for PT Re-Evaluation 03/10/16   Authorization Type 7   Authorization Time Period Gcode 10   PT Start Time 1122   PT Stop Time 1150   PT Time Calculation (min) 28 min   Activity Tolerance Patient tolerated treatment well;Patient limited by pain   Behavior During Therapy [facility] for tasks assessed/performed      Past Medical History:  Diagnosis Date  . Colon polyps   . Degenerative arthritis   . Diabetes mellitus without complication (HCC)   . Environmental allergies   . Fatty liver   . Glaucoma   . Headache(784.0)    HX  MIGRAINES  . Hypertension   . Hypothyroidism   . Obesity   . OSA on CPAP   . Plantar fasciitis    right  . PONV (postoperative nausea and vomiting)   . Renal calculi   . Renal calculi   . RLS (restless legs syndrome) 08/23/2014    Past Surgical History:  Procedure Laterality Date  . ABDOMINAL HYSTERECTOMY    . APPENDECTOMY    . Arthroscopic surgery, knee Left   . BREAST BIOPSY    . BUNIONECTOMY     LEFT   . COLONOSCOPY W/ POLYPECTOMY    . FOOT SURGERY     RIGHT    . KNEE ARTHROSCOPY W/ MENISCAL REPAIR     RIGHT  . LASIK    . MAXIMUM ACCESS (MAS)POSTERIOR LUMBAR INTERBODY FUSION (PLIF) 2 LEVEL N/A 12/13/2015   Procedure: Lumbar two-three - Lumbar three-four MAXIMUM ACCESS (MAS) POSTERIOR LUMBAR INTERBODY FUSION (PLIF)  ;  Surgeon: Tia Alert, MD;  Location: [facility] NEURO ORS;  Service: Neurosurgery;  Laterality: N/A;  . SHOULDER SURGERY     RIGHT   . TONSILLECTOMY      There were no vitals filed for this visit.       Subjective Assessment - 02/22/16 1123    Subjective Patient reports she has sinus issues today and she is feeling sore today.    Limitations Sitting;Standing;Walking;House hold activities;Other (comment)   Patient Stated Goals decrease back pain and be able to perform daily tasks without pain/difficulty   Currently in Pain? No/denies  right hip sensation of feeling different, sore in both hips with exercisees      Objective Posture: more erect and able to correct with minimal cuing  Treatment: Therapeutic exercise: patient performed exercises with guidance, verbal and tactile cues and demonstration of PT: sitting exercises: Sitting on stability ball with base for support: At [facility] cable machine: bilateral scapular rows 10# x 15 reps Single arm rows with 7# each 2 x 12-15 reps Lat straight arm pull downs with bar 12# 2 x 15 reps Reverse chin ups with 12# 2 x 15reps Hip flexion with core control x 10 reps each LE Weight shifting with core control forward and back and side to side x 1 min. each  Patient response to treatment: Patient required minimal VC and demonstration to perform exercises with correct posture, technique. Improved motor control with repetition.  PT Education - 02/22/16 1155    Education provided Yes   Education Details HEP: re assessed exercises on physioball   Person(s) Educated Patient   Methods Explanation;Demonstration;Verbal cues   Comprehension Verbalized understanding;Returned demonstration;Verbal cues required             PT Long Term Goals - 01/28/16 0945      PT LONG TERM GOAL #1   Title patient will demonstrate improved function with decreased back pain with MODI score of 50% or less by 03/10/2016   Baseline MODI 68% (severe self perceived disability)   Status New     PT LONG TERM GOAL #2   Title Patient  will demonstrate good knowledge of precautions for back and be able to stand and walk > 500' by 02/17/2016 demonstrating improved  endurance for community activities   Baseline unable to walk any distance    Status New     PT LONG TERM GOAL #3   Title iimprove to 10 seconds or less indicating improved functional community ambulation   Baseline = 14.3 seconds (.78m/s) decreased for functional community ambulation   Status New     PT LONG TERM GOAL #4   Title Patient will be independent with home exercises without cuing for core control, strengthening to allow self management once discharged from physical therapy 03/10/16   Baseline no knowledge and maximal VC required for guided exercises and progression   Status New               Plan - 02/22/16 1155    Clinical Impression Statement Patient progressing steadily, slowly due to continued pain s/p surgery. She requires moderate cuing to perform exercises correctly with proper positioning, posture and technique.    Rehab Potential Good   PT Frequency 2x / week   PT Duration 6 weeks   PT Treatment/Interventions Manual techniques;Electrical Stimulation;Moist Heat;Patient/family education;Cryotherapy;Therapeutic exercise;Dry needling;Ultrasound   PT Next Visit Plan pain control, progress exercises as tolerated   PT Home Exercise Plan home program for stabilization      Patient will benefit from skilled therapeutic intervention in order to improve the following deficits and impairments:  Pain, Impaired flexibility, Difficulty walking, Increased muscle spasms, Decreased strength, Impaired perceived functional ability, Decreased activity tolerance, Decreased range of motion, Decreased knowledge of precautions  Visit Diagnosis: Muscle weakness (generalized)  Bilateral low back pain without sciatica, unspecified chronicity  Difficulty in walking, not elsewhere classified     Problem List Patient Active Problem List   Diagnosis Date Noted  . S/P lumbar spinal fusion 12/13/2015  . Controlled type 2 diabetes mellitus without complication (HCC)  05/16/2015  . Thrombocytopenia (HCC) 05/16/2015  . Recurrent major depressive disorder, in full remission (HCC) 05/16/2015  . Essential (primary) hypertension 05/16/2015  . Fatty infiltration of liver 03/15/2015  . Aortic valve stenosis, nonrheumatic 01/18/2015  . Sciatica of right side 01/09/2015  . Type 2 diabetes mellitus (HCC) 11/10/2014  . RLS (restless legs syndrome) 08/23/2014  . Neuritis or radiculitis due to rupture of lumbar intervertebral disc 06/13/2014  . Degeneration of intervertebral disc of lumbar region 06/13/2014  . Arthritis 01/23/2014  . Arthritis of knee, degenerative 11/15/2013  . Depression 09/29/2013  . Insomnia 09/29/2013  . Apnea, sleep 08/29/2013  . Calculus of kidney 08/29/2013  . Abnormal presence of protein in urine 08/29/2013  . Headache, migraine 08/29/2013  . BP (high blood pressure) 08/29/2013  . HLD (hyperlipidemia) 08/29/2013  . Allergic rhinitis 08/29/2013  . Intractable migraine without aura  02/18/2013    Beacher MayBrooks, Carmencita Cusic PT 02/23/2016, 4:36 PM  [place] [facility] [address] [place], [place], [address] Phone: [phone]   Fax:  [phone]  Name: Nobie PutnamGail T Aguillard MRN: [id number] Date of Birth: [birth year]

## 2016-02-26 ENCOUNTER — Ambulatory Visit: Payer: Medicare HMO | Admitting: Physical Therapy

## 2016-02-26 ENCOUNTER — Encounter: Payer: Self-pay | Admitting: Physical Therapy

## 2016-02-26 ENCOUNTER — Encounter: Payer: Medicare HMO | Admitting: Physical Therapy

## 2016-02-26 DIAGNOSIS — M545 Low back pain, unspecified: Secondary | ICD-10-CM

## 2016-02-26 DIAGNOSIS — M6281 Muscle weakness (generalized): Secondary | ICD-10-CM

## 2016-02-26 DIAGNOSIS — R262 Difficulty in walking, not elsewhere classified: Secondary | ICD-10-CM

## 2016-02-27 NOTE — Therapy (Signed)
[place] [facility]TS [facility]IC[facility] [address] [place], [address] Phone: [phone]   Fax:  [phone]  Physical Therapy Treatment  Patient Details  Name: Jessica Cole MRN: [id number] Date of Birth: 1951-03-17 Referring Provider: Tia Alert MD  Encounter Date: 02/26/2016      PT End of Session - 02/26/16 1029    Visit Number 8   Number of Visits 12   Date for PT Re-Evaluation 03/10/16   Authorization Type 8   Authorization Time Period Gcode 10   PT Start Time 1020   PT Stop Time 1100   PT Time Calculation (min) 40 min   Activity Tolerance Patient tolerated treatment well;Patient limited by pain   Behavior During Therapy [facility] for tasks assessed/performed      Past Medical History:  Diagnosis Date  . Colon polyps   . Degenerative arthritis   . Diabetes mellitus without complication (HCC)   . Environmental allergies   . Fatty liver   . Glaucoma   . Headache(784.0)    HX  MIGRAINES  . Hypertension   . Hypothyroidism   . Obesity   . OSA on CPAP   . Plantar fasciitis    right  . PONV (postoperative nausea and vomiting)   . Renal calculi   . Renal calculi   . RLS (restless legs syndrome) 08/23/2014    Past Surgical History:  Procedure Laterality Date  . ABDOMINAL HYSTERECTOMY    . APPENDECTOMY    . Arthroscopic surgery, knee Left   . BREAST BIOPSY    . BUNIONECTOMY     LEFT   . COLONOSCOPY W/ POLYPECTOMY    . FOOT SURGERY     RIGHT    . KNEE ARTHROSCOPY W/ MENISCAL REPAIR     RIGHT  . LASIK    . MAXIMUM ACCESS (MAS)POSTERIOR LUMBAR INTERBODY FUSION (PLIF) 2 LEVEL N/A 12/13/2015   Procedure: Lumbar two-three - Lumbar three-four MAXIMUM ACCESS (MAS) POSTERIOR LUMBAR INTERBODY FUSION (PLIF)  ;  Surgeon: Tia Alert, MD;  Location: [facility] NEURO ORS;  Service: Neurosurgery;  Laterality: N/A;  . SHOULDER SURGERY     RIGHT   . TONSILLECTOMY      There were no vitals filed for this visit.       Subjective Assessment - 02/26/16 1026    Subjective Patient with soreness in back, hips and legs today. She reports she did climbing over the weekend and did a little more with exercises and household activities (baking).    Limitations Sitting;Standing;Walking;House hold activities;Other (comment)   Patient Stated Goals decrease back pain and be able to perform daily tasks without pain/difficulty   Currently in Pain? Yes   Pain Score 3    Pain Location Back   Pain Orientation Lower   Pain Descriptors / Indicators Aching;Sore   Pain Type Acute pain;Surgical pain   Pain Onset More than a month ago  12/13/2015      Objective: Gait: decreased trunk rotation, guarded posture  Treatment: Therapeutic exercise: patient performed exercises with guidance, verbal and tactile cues and demonstration of PT: sitting exercises: Sitting in chair (moist heat applied x 10 min. To back while patient exercised: goal: pain control, no adverse reactions noted) Hip adduction with ball and glute sets x 15 Hip abduction with green resistive band x 20 reps Knee flexion x 20 reps with green resistive band Sitting on stability ball with base for support: At [facility] cable machine: bilateral scapular rows 10#  x 15 reps Single arm rows with 7# each 2 x 15 reps Lat straight arm pull downs with bar 15# 1 x 15 reps Reverse chin ups with 15# 2 x 15reps Hip flexion with core control x 10 reps each LE Weight shifting with core control forward and back and side to side x 1 min.each  Patient response to treatment: patient demonstrated improved technique with exercises with minimal VC for correct alignment. Patient with no increase in back pain, continued with soreness 3/10.  Improved motor control with repetition and cuing       PT Education - 02/26/16 1028    Education provided Yes   Education Details HEP: re assessed LE exercise sitting in chair and physioball exercises with resistive band   Person(s)  Educated Patient   Methods Explanation;Demonstration;Verbal cues   Comprehension Verbalized understanding;Returned demonstration;Verbal cues required             PT Long Term Goals - 01/28/16 0945      PT LONG TERM GOAL #1   Title patient will demonstrate improved function with decreased back pain with MODI score of 50% or less by 03/10/2016   Baseline MODI 68% (severe self perceived disability)   Status New     PT LONG TERM GOAL #2   Title Patient  will demonstrate good knowledge of precautions for back and be able to stand and walk > 500' by 02/17/2016 demonstrating improved endurance for community activities   Baseline unable to walk any distance    Status New     PT LONG TERM GOAL #3   Title iimprove 10MW to 10 seconds or less indicating improved functional community ambulation   Baseline 10MW = 14.3 seconds (.2647m/s) decreased for functional community ambulation   Status New     PT LONG TERM GOAL #4   Title Patient will be independent with home exercises without cuing for core control, strengthening to allow self management once discharged from physical therapy 03/10/16   Baseline no knowledge and maximal VC required for guided exercises and progression   Status New               Plan - 02/26/16 1102    Clinical Impression Statement Patient progressing slowly, steadily with strength and core control. She demonstrates good carry over between sessions and is able to perform functional activities at home with less pain, difficulty.  She continues with weakness and back soreness pain that requires physical therapy in order to return to maximal function.    Rehab Potential Good   PT Frequency 2x / week   PT Duration 6 weeks   PT Treatment/Interventions Manual techniques;Electrical Stimulation;Moist Heat;Patient/family education;Cryotherapy;Therapeutic exercise;Dry needling;Ultrasound   PT Next Visit Plan pain control, progress exercises as tolerated   PT Home Exercise  Plan home program for stabilization      Patient will benefit from skilled therapeutic intervention in order to improve the following deficits and impairments:  Pain, Impaired flexibility, Difficulty walking, Increased muscle spasms, Decreased strength, Impaired perceived functional ability, Decreased activity tolerance, Decreased range of motion, Decreased knowledge of precautions  Visit Diagnosis: Muscle weakness (generalized)  Bilateral low back pain without sciatica, unspecified chronicity  Difficulty in walking, not elsewhere classified     Problem List Patient Active Problem List   Diagnosis Date Noted  . S/P lumbar spinal fusion 12/13/2015  . Controlled type 2 diabetes mellitus without complication (HCC) 05/16/2015  . Thrombocytopenia (HCC) 05/16/2015  . Recurrent major depressive disorder, in full remission (HCC)  05/16/2015  . Essential (primary) hypertension 05/16/2015  . Fatty infiltration of liver 03/15/2015  . Aortic valve stenosis, nonrheumatic 01/18/2015  . Sciatica of right side 01/09/2015  . Type 2 diabetes mellitus (HCC) 11/10/2014  . RLS (restless legs syndrome) 08/23/2014  . Neuritis or radiculitis due to rupture of lumbar intervertebral disc 06/13/2014  . Degeneration of intervertebral disc of lumbar region 06/13/2014  . Arthritis 01/23/2014  . Arthritis of knee, degenerative 11/15/2013  . Depression 09/29/2013  . Insomnia 09/29/2013  . Apnea, sleep 08/29/2013  . Calculus of kidney 08/29/2013  . Abnormal presence of protein in urine 08/29/2013  . Headache, migraine 08/29/2013  . BP (high blood pressure) 08/29/2013  . HLD (hyperlipidemia) 08/29/2013  . Allergic rhinitis 08/29/2013  . Intractable migraine without aura 02/18/2013    Beacher May PT 02/27/2016, 2:47 PM  [place] [facility] PHYSICAL AND SPORTS MEDICINE [address], [place], [address] Phone: [phone]   Fax:  [phone]  Name: Jessica Cole MRN: [id number] Date of Birth: 01-21-1951

## 2016-02-29 ENCOUNTER — Encounter: Payer: Medicare HMO | Admitting: Physical Therapy

## 2016-02-29 ENCOUNTER — Ambulatory Visit: Payer: Medicare HMO | Admitting: Physical Therapy

## 2016-03-04 ENCOUNTER — Ambulatory Visit: Payer: Medicare HMO | Admitting: Physical Therapy

## 2016-03-04 ENCOUNTER — Encounter: Payer: Self-pay | Admitting: Physical Therapy

## 2016-03-04 DIAGNOSIS — R262 Difficulty in walking, not elsewhere classified: Secondary | ICD-10-CM

## 2016-03-04 DIAGNOSIS — M6281 Muscle weakness (generalized): Secondary | ICD-10-CM

## 2016-03-04 DIAGNOSIS — M545 Low back pain, unspecified: Secondary | ICD-10-CM

## 2016-03-04 NOTE — Therapy (Signed)
[place] [facility] PH[facility] AND SPORTS MEDICINE [address] [place], [place], [address] Phone: [phone]   Fax:  [phone]  Physical Therapy Treatment  Patient Details  Name: Jessica PutnamGail T Givler MRN: [id number] Date of Birth: 10-Feb-1951 Referring Provider: Tia AlertJones, David S MD  Encounter Date: 03/04/2016      PT End of Session - 03/04/16 0859    Visit Number 9   Number of Visits 12   Date for PT Re-Evaluation 03/10/16   Authorization Type 9   Authorization Time Period Gcode 10   PT Start Time 0848   PT Stop Time 0930   PT Time Calculation (min) 42 min   Activity Tolerance Patient tolerated treatment well;Patient limited by pain   Behavior [facility] tasks assessed/performed      Past Medical History:  Diagnosis Date  . Colon polyps   . Degenerative arthritis   . Diabetes mellitus without complication (HCC)   . Environmental allergies   . Fatty liver   . Glaucoma   . Headache(784.0)    HX  MIGRAINES  . Hypertension   . Hypothyroidism   . Obesity   . OSA on CPAP   . Plantar fasciitis    right  . PONV (postoperative nausea and vomiting)   . Renal calculi   . Renal calculi   . RLS (restless legs syndrome) 08/23/2014    Past Surgical History:  Procedure Laterality Date  . ABDOMINAL HYSTERECTOMY    . APPENDECTOMY    . Arthroscopic surgery, knee Left   . BREAST BIOPSY    . BUNIONECTOMY     LEFT   . COLONOSCOPY W/ POLYPECTOMY    . FOOT SURGERY     RIGHT    . KNEE ARTHROSCOPY W/ MENISCAL REPAIR     RIGHT  . LASIK    . MAXIMUM ACCESS (MAS)POSTERIOR LUMBAR INTERBODY FUSION (PLIF) 2 LEVEL N/A 12/13/2015   Procedure: Lumbar two-three - Lumbar three-four MAXIMUM ACCESS (MAS) POSTERIOR LUMBAR INTERBODY FUSION (PLIF)  ;  Surgeon: Tia Alertavid S Jones, MD;  [facility];  Service: Neurosurgery;  Laterality: N/A;  . SHOULDER SURGERY     RIGHT   . TONSILLECTOMY      There were no vitals filed for this visit.       Subjective Assessment - 03/04/16 0855    Subjective Patient reports both LE's with pain today. "sciatica". She has noticed her leg occasionally "giving way" but no real weakness and denies bowel or bladder problems.   Limitations Sitting;Standing;Walking;House hold activities;Other (comment)   Patient Stated Goals decrease back pain and be able to perform daily tasks without pain/difficulty   Currently in Pain? Yes   Pain Score 4    Pain Location Back   Pain Orientation Lower   Pain Descriptors / Indicators Sharp   Pain Type Acute pain;Surgical pain   Pain Radiating Towards both hips, to just below the knee   Pain Onset More than a month ago  12/13/15   Pain Frequency Intermittent      Objective: Gait: antalgic gait pattern Strength: grossly both LE's major muscle groups WFL, limited ability to assess due to hyper sensitivity to touch and any movement today  Treatment: Therapeutic exercise: patient performed exercises with guidance, verbal and tactile cues and demonstration of PT: sitting exercises: Sitting in chair (ice pack (unbilled) applied pre exercise with back supported x 10 min. goal: pain control, no adverse reactions noted) Hip adduction with ball and glute sets x  15 Sitting on stability ball with base for support: [facility]: bilateral scapular rows 10# x 15 reps Single arm rows with 5# each 1x 15 reps Lat straight arm pull downs with bar 15# 1 x 15 reps Reverse chin ups with 15#1 x 15reps Bilateral flexion with 3# weight overhead x 10 Weight shifting with core control forward and back and side to side x 1 min.each  Patient response to treatment: Patient required modification of exercises to decrease pain in back/LE's patient demonstrated improved technique with exercises with minimal VC for correct alignment. Patient with intermittent sharp pains reported into right hip and left hip/gluteal region, Improved motor control with repetition and cuing for  core control exercises        PT Education - 03/04/16 0950    Education provided Yes   Education Details HEP: continue with core control, walk as much as tolerated, sit with lumbar support   Person(s) Educated Patient   Methods Explanation;Demonstration   Comprehension Verbalized understanding;Returned demonstration             PT Long Term Goals - 01/28/16 0945      PT LONG TERM GOAL #1   Title patient will demonstrate improved function with decreased back pain with MODI score of 50% or less by 03/10/2016   Baseline MODI 68% (severe self perceived disability)   Status New     PT LONG TERM GOAL #2   Title Patient  will demonstrate good knowledge of precautions for back and be able to stand and walk > 500' by 02/17/2016 demonstrating improved endurance for community activities   Baseline unable to walk any distance    Status New     PT LONG TERM GOAL #3   Title iimprove 10MW to 10 seconds or less indicating improved functional community ambulation   Baseline 10MW = 14.3 seconds (.5931m/s) decreased for functional community ambulation   Status New     PT LONG TERM GOAL #4   Title Patient will be independent with home exercises without cuing for core control, strengthening to allow self management once discharged from physical therapy 03/10/16   Baseline no knowledge and maximal VC required for guided exercises and progression   Status New               Plan - 03/04/16 0946    Clinical Impression Statement Patient with increased pain into right hip/posterior gluteal region that did not improve with exercise or ice. She noticed the increase in symptoms las week following going up/down a lot of steps. Recommended that patient call surgeon and discuss new symptoms to see if she needs to be seen. Patient was able to perform all core control/strengthening with guidance and without increased pain in back and LE's.    Rehab Potential Good   PT Frequency 2x / week   PT  Duration 6 weeks   PT Treatment/Interventions Manual techniques;Electrical Stimulation;Moist Heat;Patient/family education;Cryotherapy;Therapeutic exercise;Dry needling;Ultrasound   PT Next Visit Plan pain control, progress exercises as tolerated   PT Home Exercise Plan home program for stabilization      Patient will benefit from skilled therapeutic intervention in order to improve the following deficits and impairments:  Pain, Impaired flexibility, Difficulty walking, Increased muscle spasms, Decreased strength, Impaired perceived functional ability, Decreased activity tolerance, Decreased range of motion, Decreased knowledge of precautions  Visit Diagnosis: Muscle weakness (generalized)  Bilateral low back pain without sciatica, unspecified chronicity  Difficulty in walking, not elsewhere classified  Problem List Patient Active Problem List   Diagnosis Date Noted  . S/P lumbar spinal fusion 12/13/2015  . Controlled type 2 diabetes mellitus without complication (HCC) 05/16/2015  . Thrombocytopenia (HCC) 05/16/2015  . Recurrent major depressive disorder, in full remission (HCC) 05/16/2015  . Essential (primary) hypertension 05/16/2015  . Fatty infiltration of liver 03/15/2015  . Aortic valve stenosis, nonrheumatic 01/18/2015  . Sciatica of right side 01/09/2015  . Type 2 diabetes mellitus (HCC) 11/10/2014  . RLS (restless legs syndrome) 08/23/2014  . Neuritis or radiculitis due to rupture of lumbar intervertebral disc 06/13/2014  . Degeneration of intervertebral disc of lumbar region 06/13/2014  . Arthritis 01/23/2014  . Arthritis of knee, degenerative 11/15/2013  . Depression 09/29/2013  . Insomnia 09/29/2013  . Apnea, sleep 08/29/2013  . Calculus of kidney 08/29/2013  . Abnormal presence of protein in urine 08/29/2013  . Headache, migraine 08/29/2013  . BP (high blood pressure) 08/29/2013  . HLD (hyperlipidemia) 08/29/2013  . Allergic rhinitis 08/29/2013  .  Intractable migraine without aura 02/18/2013    Beacher May PT 03/04/2016, 9:50 AM  [facility] PHYSICAL AND SPORTS MEDICINE [address] [place], [address] Phone: [phone]   Fax:  [phone]  Name: AHTZIRI JEFFRIES MRN: [id number] Date of Birth: 1950/11/25

## 2016-03-06 ENCOUNTER — Ambulatory Visit
Admission: RE | Admit: 2016-03-06 | Discharge: 2016-03-06 | Disposition: A | Discharge status: Home / Self Care | Payer: Medicare HMO | Source: Ambulatory Visit | Attending: Internal Medicine | Admitting: Internal Medicine

## 2016-03-06 DIAGNOSIS — N6310 Unspecified lump in the right breast, unspecified quadrant: Secondary | ICD-10-CM | POA: Diagnosis present

## 2016-03-06 DIAGNOSIS — N6001 Solitary cyst of right breast: Secondary | ICD-10-CM | POA: Diagnosis not present

## 2016-03-06 DIAGNOSIS — Z1231 Encounter for screening mammogram for malignant neoplasm of breast: Secondary | ICD-10-CM | POA: Diagnosis present

## 2016-03-06 DIAGNOSIS — N6314 Unspecified lump in the right breast, lower inner quadrant: Secondary | ICD-10-CM | POA: Diagnosis not present

## 2016-03-07 ENCOUNTER — Encounter: Payer: Self-pay | Admitting: Physical Therapy

## 2016-03-07 ENCOUNTER — Ambulatory Visit: Payer: Medicare HMO | Attending: Neurological Surgery | Admitting: Physical Therapy

## 2016-03-07 DIAGNOSIS — M6281 Muscle weakness (generalized): Secondary | ICD-10-CM | POA: Insufficient documentation

## 2016-03-07 DIAGNOSIS — M545 Low back pain, unspecified: Secondary | ICD-10-CM

## 2016-03-07 DIAGNOSIS — R262 Difficulty in walking, not elsewhere classified: Secondary | ICD-10-CM | POA: Diagnosis present

## 2016-03-07 NOTE — Therapy (Signed)
[place] [facility] [address] [place], [place], [address] Phone: [phone]   Fax:  [phone]  Physical Therapy Treatment  Patient Details  Name: Jessica Cole MRN: [id number] Date of Birth: [birth year] Referring Provider: Tia AlertJones, David S MD  Encounter Date: 03/07/2016      PT End of Session - 03/07/16 1000    Visit Number 10   Number of Visits 12   Date for PT Re-Evaluation 03/10/16   Authorization Type 10   Authorization Time Period Gcode 10   PT Start Time [id number]   PT Stop Time 0955   PT Time Calculation (min) 30 min   Activity Tolerance Patient tolerated treatment well;Patient limited by pain   Behavior [facility] assessed/performed      Past Medical History:  Diagnosis Date  . Colon polyps   . Degenerative arthritis   . Diabetes mellitus without complication (HCC)   . Environmental allergies   . Fatty liver   . Glaucoma   . Headache(784.0)    HX  MIGRAINES  . Hypertension   . Hypothyroidism   . Obesity   . OSA on CPAP   . Plantar fasciitis    right  . PONV (postoperative nausea and vomiting)   . Renal calculi   . Renal calculi   . RLS (restless legs syndrome) 08/23/2014    Past Surgical History:  Procedure Laterality Date  . ABDOMINAL HYSTERECTOMY    . APPENDECTOMY    . Arthroscopic surgery, knee Left   . BREAST BIOPSY Right   . BUNIONECTOMY     LEFT   . COLONOSCOPY W/ POLYPECTOMY    . FOOT SURGERY     RIGHT    . KNEE ARTHROSCOPY W/ MENISCAL REPAIR     RIGHT  . LASIK    . MAXIMUM ACCESS (MAS)POSTERIOR LUMBAR INTERBODY FUSION (PLIF) 2 LEVEL N/A 12/13/2015   Procedure: Lumbar two-three - Lumbar three-four MAXIMUM ACCESS (MAS) POSTERIOR LUMBAR INTERBODY FUSION (PLIF)  ;  Surgeon: Tia Alertavid S Jones, MD;  [facility];  Service: Neurosurgery;  Laterality: N/A;  . SHOULDER SURGERY     RIGHT   . TONSILLECTOMY      There were no vitals filed for this visit.       Subjective Assessment - 03/07/16 0927    Subjective Patient reports she is now taking anti inflammatory medication, a steroid, not sure of the name. She  began taking medication yesterday and it seems to bee helping.    Limitations Sitting;Standing;Walking;House hold activities;Other (comment)   Patient Stated Goals decrease back pain and be able to perform daily tasks without pain/difficulty   Currently in Pain? Yes   Pain Score 2    Pain Location Back   Pain Orientation Lower   Pain Descriptors / Indicators Aching;Dull   Pain Type Acute pain;Surgical pain   Pain Onset More than a month ago  12/13/2015   Pain Frequency Intermittent        Objective: Gait: antalgic gait pattern   Treatment: Therapeutic exercise: patient performed exercises with guidance, verbal and tactile cues and demonstration of PT: sitting exercises: Sitting on stability ball with base for support:  bilateral scapular rows green tubing 2 x 15 reps Single arm rows with green resistive tubing  each 1x 15 reps Lat straight arm pull downs with green tubing 2x 15 reps Bilateral flexion with ball overhead x 10 Weight shifting with core control forward and back and side  to side x 1 min.each Walking x 2 min. Following exercises to decreased LE/back pain  Patient response to treatment: Patient required modification today to avoid exacerbation of symptoms. She was able to complete exercises with moderate assistance and VC. Increased pain in lower back/LE's with most exercises, no worse at end of session.         PT Education - 03/07/16 0932    Education provided Yes   Education Details HEP: core exercises with ball and resistive bands   Person(s) Educated Patient   Methods Explanation;Demonstration;Verbal cues   Comprehension Verbalized understanding;Returned demonstration;Verbal cues required             PT Long Term Goals - 01/28/16 0945      PT LONG TERM GOAL #1   Title patient will  demonstrate improved function with decreased back pain with MODI score of 50% or less by 03/10/2016   Baseline MODI 68% (severe self perceived disability)   Status New     PT LONG TERM GOAL #2   Title Patient  will demonstrate good knowledge of precautions for back and be able to stand and walk > 500' by 02/17/2016 demonstrating improved endurance for community activities   Baseline unable to walk any distance    Status New     PT LONG TERM GOAL #3   Title iimprove to 10 seconds or less indicating improved functional community ambulation   Baseline = 14.3 seconds (.34m/s) decreased for functional community ambulation   Status New     PT LONG TERM GOAL #4   Title Patient will be independent with home exercises without cuing for core control, strengthening to allow self management once discharged from physical therapy 03/10/16   Baseline no knowledge and maximal VC required for guided exercises and progression   Status New               Plan - 03/07/16 1000    Clinical Impression Statement Patient arrived late to therapy and had incresaed pain which limited session of exercises today. She was able to perform exercises with modifications with noted increased back symptoms.  No worse at end of session.   Rehab Potential Good   PT Frequency 2x / week   PT Duration 6 weeks   PT Treatment/Interventions Manual techniques;Electrical Stimulation;Moist Heat;Patient/family education;Cryotherapy;Therapeutic exercise;Dry needling;Ultrasound   PT Next Visit Plan pain control, progress exercises as tolerated   PT Home Exercise Plan home program for stabilization      Patient will benefit from skilled therapeutic intervention in order to improve the following deficits and impairments:  Pain, Impaired flexibility, Difficulty walking, Increased muscle spasms, Decreased strength, Impaired perceived functional ability, Decreased activity tolerance, Decreased range of motion, Decreased  knowledge of precautions  Visit Diagnosis: Muscle weakness (generalized)  Bilateral low back pain without sciatica, unspecified chronicity  Difficulty in walking, not elsewhere classified     Problem List Patient Active Problem List   Diagnosis Date Noted  . S/P lumbar spinal fusion 12/13/2015  . Controlled type 2 diabetes mellitus without complication (HCC) 05/16/2015  . Thrombocytopenia (HCC) 05/16/2015  . Recurrent major depressive disorder, in full remission (HCC) 05/16/2015  . Essential (primary) hypertension 05/16/2015  . Fatty infiltration of liver 03/15/2015  . Aortic valve stenosis, nonrheumatic 01/18/2015  . Sciatica of right side 01/09/2015  . Type 2 diabetes mellitus (HCC) 11/10/2014  . RLS (restless legs syndrome) 08/23/2014  . Neuritis or radiculitis due to rupture of lumbar intervertebral disc 06/13/2014  . Degeneration  of intervertebral disc of lumbar region 06/13/2014  . Arthritis 01/23/2014  . Arthritis of knee, degenerative 11/15/2013  . Depression 09/29/2013  . Insomnia 09/29/2013  . Apnea, sleep 08/29/2013  . Calculus of kidney 08/29/2013  . Abnormal presence of protein in urine 08/29/2013  . Headache, migraine 08/29/2013  . BP (high blood pressure) 08/29/2013  . HLD (hyperlipidemia) 08/29/2013  . Allergic rhinitis 08/29/2013  . Intractable migraine without aura 02/18/2013    Beacher MayBrooks, Granville Whitefield PT 03/08/2016, 4:25 PM  [place] [facility] [address] [place], [place], [address] Phone: [phone]   Fax:  [phone]  Name: Jessica Cole MRN: [id number] Date of Birth: [birth year]

## 2016-03-10 ENCOUNTER — Other Ambulatory Visit: Payer: Self-pay | Admitting: Internal Medicine

## 2016-03-10 ENCOUNTER — Ambulatory Visit: Payer: Medicare HMO | Admitting: Physical Therapy

## 2016-03-10 DIAGNOSIS — M545 Low back pain, unspecified: Secondary | ICD-10-CM

## 2016-03-10 DIAGNOSIS — R262 Difficulty in walking, not elsewhere classified: Secondary | ICD-10-CM

## 2016-03-10 DIAGNOSIS — N6310 Unspecified lump in the right breast, unspecified quadrant: Secondary | ICD-10-CM

## 2016-03-10 DIAGNOSIS — M6281 Muscle weakness (generalized): Secondary | ICD-10-CM | POA: Diagnosis not present

## 2016-03-10 NOTE — Therapy (Signed)
[facility] [address] [place], [address] Phone: [phone]   Fax:  [phone]  Physical Therapy Treatment  Patient Details  Name: Jessica Cole MRN: [id number] Date of Birth: 1951-01-14 Referring Provider: Eustace Moore MD  Encounter Date: 03/10/2016      PT End of Session - 03/10/16 1005    Visit Number 11   Number of Visits 12   Date for PT Re-Evaluation 03/10/16   Authorization Type 11   Authorization Time Period G-code 20   PT Start Time 1001   PT Stop Time 1038   PT Time Calculation (min) 37 min   Activity Tolerance Patient tolerated treatment well;Patient limited by pain   Behavior During Therapy [facility] for tasks assessed/performed      Past Medical History:  Diagnosis Date  . Colon polyps   . Degenerative arthritis   . Diabetes mellitus without complication ([place])   . Environmental allergies   . Fatty liver   . Glaucoma   . Headache(784.0)    HX  MIGRAINES  . Hypertension   . Hypothyroidism   . Obesity   . OSA on CPAP   . Plantar fasciitis    right  . PONV (postoperative nausea and vomiting)   . Renal calculi   . Renal calculi   . RLS (restless legs syndrome) 08/23/2014    Past Surgical History:  Procedure Laterality Date  . ABDOMINAL HYSTERECTOMY    . APPENDECTOMY    . Arthroscopic surgery, knee Left   . BREAST BIOPSY Right   . BUNIONECTOMY     LEFT   . COLONOSCOPY W/ POLYPECTOMY    . FOOT SURGERY     RIGHT    . KNEE ARTHROSCOPY W/ MENISCAL REPAIR     RIGHT  . LASIK    . MAXIMUM ACCESS (MAS)POSTERIOR LUMBAR INTERBODY FUSION (PLIF) 2 LEVEL N/A 12/13/2015   Procedure: Lumbar two-three - Lumbar three-four MAXIMUM ACCESS (MAS) POSTERIOR LUMBAR INTERBODY FUSION (PLIF)  ;  Surgeon: Eustace Moore, MD;  [facility]: [facility] NEURO ORS;  Service: Neurosurgery;  Laterality: N/A;  . SHOULDER SURGERY     RIGHT   . TONSILLECTOMY      There were no vitals filed for this visit.       Subjective Assessment - 03/10/16 1000    Subjective Patient reports she continues with pain in lower back into left LE. She is still on anit inflammatory medication (will finish today)  and feels there is little to no imrpovement at this time. She has a call into the doctors office and is waiting for a reply.    Limitations Sitting;Standing;Walking;House hold activities;Other (comment)   Patient Stated Goals decrease back pain and be able to perform daily tasks without pain/difficulty   Currently in Pain? Yes   Pain Score 5    Pain Location Back   Pain Orientation Lower   Pain Descriptors / Indicators Aching;Sharp  sharp intermittent pains into left LE   Pain Type Acute pain;Surgical pain   Pain Radiating Towards both hips, to just below the knee left>right   Pain Onset More than a month ago  12/13/2015   Pain Frequency Constant      Objective: Gait: antalgic gait pattern: walking without AD, spasms reported in left LE and hip/gluteal region with sit to stand from chair and walking Strength: left LE at least 4/5 major muscle groups with increased pain with hip flexion, right LE 5/5 major muscle groups  without pain Reflexes: decreased left knee patellar tendon reflex as compared to right   Treatment: Therapeutic exercise: patient performed exercises with guidance, verbal and tactile cues and demonstration of PT: sitting exercises: Sitting on treatment table: bilateral scapular rows green tubing 2 x 15 reps Single arm rows with green resistive tubing  each 1x 15 reps Lat straight arm pull downs with green tubing 2x 15 reps Bilateral flexion with ball overhead x 10 On stability ball: Weight shifting with core control forward and back and side to side x 1 min.each Marching alternating LE's x 10 reps Walking x 2 min. Following exercises to decreased LE/back pain  Patient response to treatment: Patient unable to perform any exercise without exacerbation of left sided or right  sided back pain. Tried to modify position/posture without result.  She was able to complete exercises with moderate assistance and VC. Increased pain in lower back/LE's with most exercises, no worse at end of session. Continued with spasms in left gluteal region into left LE on standing and walking at end of session          PT Education - 03/10/16 1039    Education provided Yes   Education Details HEP: continue with exercises as able with resistive bands and exercise ball   Person(s) Educated Patient   Methods Explanation;Handout;Demonstration;Verbal cues   Comprehension Verbalized understanding;Returned demonstration;Verbal cues required             PT Long Term Goals - 03/10/16 1822      PT LONG TERM GOAL #1   Title patient will demonstrate improved function with decreased back pain with MODI score of 50% or less by 03/10/2016   Baseline MODI 68% (severe self perceived disability)   Status Not Met     PT LONG TERM GOAL #2   Title Patient  will demonstrate good knowledge of precautions for back and be able to stand and walk > 500' by 02/17/2016 demonstrating improved endurance for community activities   Baseline unable to walk any distance without increased back and left LE pain/spasms   Status Not Met     PT LONG TERM GOAL #3   Title iimprove to 10 seconds or less indicating improved functional community ambulation   Baseline = 14.3 seconds (.72m/s) decreased for functional community ambulation (deferred 03/10/16 due to pain level /spasms with walking any distance)   Status Deferred     PT LONG TERM GOAL #4   Title Patient will be independent with home exercises without cuing for core control, strengthening to allow self management once discharged from physical therapy 03/10/16   Baseline no knowledge and maximal VC required for guided exercises and progression (understands HEP as given for core/strengthening at level tolerated)   Status Partially Met                Plan - 03/10/16 1009    Clinical Impression Statement Pain is primary limiting factor to progressing with exercises. She has constant pain that is not relieved with medication or position preference. Plan to contact MD regarding further treatment or further evaluation to determine source of continued pain in back/LLE.    Rehab Potential Good   PT Frequency 2x / week   PT Duration 6 weeks   PT Treatment/Interventions Manual techniques;Electrical Stimulation;Moist Heat;Patient/family education;Cryotherapy;Therapeutic exercise;Dry needling;Ultrasound   PT Next Visit Plan pain control, progress exercises as tolerated   PT Home Exercise Plan home program for stabilization      Patient will benefit from  skilled therapeutic intervention in order to improve the following deficits and impairments:  Pain, Impaired flexibility, Difficulty walking, Increased muscle spasms, Decreased strength, Impaired perceived functional ability, Decreased activity tolerance, Decreased range of motion, Decreased knowledge of precautions  Visit Diagnosis: Muscle weakness (generalized)  Bilateral low back pain without sciatica, unspecified chronicity  Difficulty in walking, not elsewhere classified     Problem List Patient Active Problem List   Diagnosis Date Noted  . S/P lumbar spinal fusion 12/13/2015  . Controlled type 2 diabetes mellitus without complication ([place]) 59/74/1638  . Thrombocytopenia ([place]) 05/16/2015  . Recurrent major depressive disorder, in full remission ([place]) 05/16/2015  . Essential (primary) hypertension 05/16/2015  . Fatty infiltration of liver 03/15/2015  . Aortic valve stenosis, nonrheumatic 01/18/2015  . Sciatica of right side 01/09/2015  . Type 2 diabetes mellitus ([place]) 11/10/2014  . RLS (restless legs syndrome) 08/23/2014  . Neuritis or radiculitis due to rupture of lumbar intervertebral disc 06/13/2014  . Degeneration of intervertebral disc of lumbar region  06/13/2014  . Arthritis 01/23/2014  . Arthritis of knee, degenerative 11/15/2013  . Depression 09/29/2013  . Insomnia 09/29/2013  . Apnea, sleep 08/29/2013  . Calculus of kidney 08/29/2013  . Abnormal presence of protein in urine 08/29/2013  . Headache, migraine 08/29/2013  . BP (high blood pressure) 08/29/2013  . HLD (hyperlipidemia) 08/29/2013  . Allergic rhinitis 08/29/2013  . Intractable migraine without aura 02/18/2013    Jomarie Longs PT 03/10/2016, 6:24 PM  [facility] [address], [place], [address] Phone: [phone]   Fax:  [phone]  Name: Jessica Cole MRN: [id number] Date of Birth: Dec 04, 1950

## 2016-03-11 ENCOUNTER — Encounter: Payer: Medicare HMO | Admitting: Physical Therapy

## 2016-03-14 ENCOUNTER — Ambulatory Visit: Payer: Medicare HMO | Admitting: Physical Therapy

## 2016-03-18 ENCOUNTER — Encounter: Payer: Medicare HMO | Admitting: Physical Therapy

## 2016-03-19 ENCOUNTER — Other Ambulatory Visit: Payer: Self-pay | Admitting: Neurological Surgery

## 2016-03-19 DIAGNOSIS — M5416 Radiculopathy, lumbar region: Secondary | ICD-10-CM

## 2016-03-21 ENCOUNTER — Ambulatory Visit
Admission: RE | Admit: 2016-03-21 | Discharge: 2016-03-21 | Disposition: A | Discharge status: Home / Self Care | Payer: Medicare HMO | Source: Ambulatory Visit | Attending: Neurological Surgery | Admitting: Neurological Surgery

## 2016-03-21 DIAGNOSIS — M5416 Radiculopathy, lumbar region: Secondary | ICD-10-CM

## 2016-03-24 ENCOUNTER — Ambulatory Visit
Admission: RE | Admit: 2016-03-24 | Discharge: 2016-03-24 | Disposition: A | Discharge status: Home / Self Care | Payer: Medicare HMO | Source: Ambulatory Visit | Attending: Internal Medicine | Admitting: Internal Medicine

## 2016-03-24 ENCOUNTER — Other Ambulatory Visit: Payer: Self-pay | Admitting: Internal Medicine

## 2016-03-24 DIAGNOSIS — N6310 Unspecified lump in the right breast, unspecified quadrant: Secondary | ICD-10-CM

## 2016-03-25 ENCOUNTER — Encounter: Payer: Medicare HMO | Admitting: Physical Therapy

## 2016-03-25 LAB — SURGICAL PATHOLOGY

## 2016-04-01 ENCOUNTER — Other Ambulatory Visit: Payer: Self-pay | Admitting: Neurological Surgery

## 2016-04-01 ENCOUNTER — Encounter: Payer: Medicare HMO | Admitting: Physical Therapy

## 2016-04-11 ENCOUNTER — Other Ambulatory Visit: Payer: Self-pay | Admitting: Physician Assistant

## 2016-04-11 ENCOUNTER — Ambulatory Visit
Admission: RE | Admit: 2016-04-11 | Discharge: 2016-04-11 | Disposition: A | Discharge status: Home / Self Care | Payer: Medicare HMO | Source: Ambulatory Visit | Attending: Physician Assistant | Admitting: Physician Assistant

## 2016-04-11 DIAGNOSIS — R310 Gross hematuria: Secondary | ICD-10-CM | POA: Diagnosis not present

## 2016-04-11 DIAGNOSIS — R109 Unspecified abdominal pain: Secondary | ICD-10-CM | POA: Diagnosis present

## 2016-04-11 DIAGNOSIS — K5730 Diverticulosis of large intestine without perforation or abscess without bleeding: Secondary | ICD-10-CM | POA: Diagnosis not present

## 2016-04-11 DIAGNOSIS — I700 Atherosclerosis of aorta: Secondary | ICD-10-CM | POA: Diagnosis not present

## 2016-04-11 DIAGNOSIS — N132 Hydronephrosis with renal and ureteral calculous obstruction: Secondary | ICD-10-CM | POA: Insufficient documentation

## 2016-04-11 DIAGNOSIS — R932 Abnormal findings on diagnostic imaging of liver and biliary tract: Secondary | ICD-10-CM | POA: Insufficient documentation

## 2016-04-11 DIAGNOSIS — I708 Atherosclerosis of other arteries: Secondary | ICD-10-CM | POA: Diagnosis not present

## 2016-04-14 ENCOUNTER — Other Ambulatory Visit: Payer: Self-pay | Admitting: Physician Assistant

## 2016-04-14 DIAGNOSIS — N83202 Unspecified ovarian cyst, left side: Secondary | ICD-10-CM

## 2016-04-17 ENCOUNTER — Ambulatory Visit
Admission: RE | Admit: 2016-04-17 | Discharge: 2016-04-17 | Disposition: A | Discharge status: Home / Self Care | Payer: Medicare HMO | Source: Ambulatory Visit | Attending: Physician Assistant | Admitting: Physician Assistant

## 2016-04-17 ENCOUNTER — Encounter (HOSPITAL_COMMUNITY): Payer: Self-pay

## 2016-04-17 DIAGNOSIS — Z90710 Acquired absence of both cervix and uterus: Secondary | ICD-10-CM | POA: Diagnosis not present

## 2016-04-17 DIAGNOSIS — N83202 Unspecified ovarian cyst, left side: Secondary | ICD-10-CM | POA: Diagnosis present

## 2016-04-17 NOTE — Pre-Procedure Instructions (Signed)
Jessica PutnamGail T Cole  04/17/2016      [facility] [phone]#[id number] [place], [place] - [address] [place] [address] Phone: [phone] Fax: [phone]  [facility] [phone]#[id number] [place]- [place], [place] - [address] [place] [id number] Phone: [phone] Fax: [phone]    Your procedure is scheduled on Friday, December 22nd, 2017.  [facility] 8:45 A.M.   Call this number if you have problems the morning of Cole:  [phone]   Remember:  Do not eat food or drink liquids after midnight.   Take these medicines the morning of Cole with A SIP OF WATER: Duloxetine (Cymbalta), Fexofenadine (Aller-ease), Flonase, Gabapentin (Neurontin), Levothyroxine (Synthroid), Metoprolol Succinate (Toprol-XL), Oxycodone HCL if needed, Ropinirole (Requip) if needed, nasal spray and eye drops as needed.   Stop taking: Diclofenac Sodium (Voltaren) gel, Naproxen Sodium (Anaprox), Aleve, Sulindac (Clinoril), Aspirin, NSAIDS, Ibuprofen, Advil, Motrin, BC's, Goody's, Fish oil, all herbal medications, and all vitamins.    WHAT DO I DO ABOUT MY DIABETES MEDICATION?  Marland Kitchen. Do not take oral diabetes medicines (pills) the morning of Cole.  Do NOT take Metformin the morning of Cole.    How to Manage Your Diabetes Before and After Cole  Why is it important to control my blood sugar before and after Cole? . Improving blood sugar levels before and after Cole helps healing and can limit problems. . A way of improving blood sugar control is eating a healthy diet by: o  Eating less sugar and carbohydrates o  Increasing activity/exercise o  Talking with your doctor about reaching your blood sugar goals . High blood sugars (greater than 180 mg/dL) can raise your risk of infections and slow your recovery, so you will need to focus on controlling your diabetes during the weeks before Cole. . Make sure that the doctor who  takes care of your diabetes knows about your planned Cole including the date and location.  How do I manage my blood sugar before Cole? . Check your blood sugar at least 4 times a day, starting 2 days before Cole, to make sure that the level is not too high or low. o Check your blood sugar the morning of your Cole when you wake up and every 2 hours until you get to the Short Stay unit. . If your blood sugar is less than 70 mg/dL, you will need to treat for low blood sugar: o Do not take insulin. o Treat a low blood sugar (less than 70 mg/dL) with  cup of clear juice (cranberry or apple), 4 glucose tablets, OR glucose gel. o Recheck blood sugar in 15 minutes after treatment (to make sure it is greater than 70 mg/dL). If your blood sugar is not greater than 70 mg/dL on recheck, call [phone] for further instructions. . Report your blood sugar to the short stay nurse when you get to Short Stay.  . If you are admitted to the hospital after Cole: o Your blood sugar will be checked by the staff and you will probably be given insulin after Cole (instead of oral diabetes medicines) to make sure you have good blood sugar levels. o The goal for blood sugar control after Cole is 80-180 mg/dL.    Do not wear jewelry, make-up or nail polish.  Do not wear lotions, powders, or perfumes, or deoderant.  Do not shave 48 hours prior to Cole.    Do not bring  valuables to the hospital.  [facility] is [facility] any belongings or valuables.  Contacts, dentures or bridgework may not be worn into Cole.  Leave your suitcase in the car.  After Cole it may be brought to your room.  For patients admitted to the hospital, discharge time will be determined by your treatment team.  Patients discharged the day of Cole will not be allowed to Cole home.   Special instructions:  Preparing for Cole.   Jessica Cole  Before Cole, you can play an  important role. Because skin is not sterile, your skin needs to be as free of germs as possible. You can reduce the number of germs on your skin by washing with Jessica (chlorahexidine gluconate) Soap before Cole.  Jessica is an antiseptic cleaner which kills germs and bonds with the skin to continue killing germs even after washing.  Please do not use if you have an allergy to Jessica or antibacterial soaps. If your skin becomes reddened/irritated stop using the Jessica.  Do not shave (including legs and underarms) for at least 48 hours prior to first Jessica shower. It is OK to shave your face.  Please follow these instructions carefully.   1. Shower the NIGHT BEFORE Cole and the MORNING OF Cole with Jessica.   2. If you chose to wash your hair, wash your hair first as usual with your normal shampoo.  3. After you shampoo, rinse your hair and body thoroughly to remove the shampoo.  4. Use Jessica as you would any other liquid soap. You can apply Jessica directly to the skin and wash gently with a scrungie or a clean washcloth.   5. Apply the Jessica Soap to your body ONLY FROM THE NECK DOWN.  Do not use on open wounds or open sores. Avoid contact with your eyes, ears, mouth and genitals (private parts). Wash genitals (private parts) with your normal soap.  6. Wash thoroughly, paying special attention to the area where your Cole will be performed.  7. Thoroughly rinse your body with warm water from the neck down.  8. DO NOT shower/wash with your normal soap after using and rinsing off the Jessica Soap.  9. Pat yourself dry with a CLEAN TOWEL.   10. Wear CLEAN PAJAMAS   11. Place CLEAN SHEETS on your bed the night of your first shower and DO NOT SLEEP WITH PETS.  Day of Cole: Do not apply any deodorants/lotions. Please wear clean clothes to the hospital/Cole center.     Please read over the following fact sheets that you were given. MRSA Information

## 2016-04-18 ENCOUNTER — Encounter (HOSPITAL_COMMUNITY)
Admission: RE | Admit: 2016-04-18 | Discharge: 2016-04-18 | Disposition: A | Discharge status: Home / Self Care | Payer: Medicare HMO | Source: Ambulatory Visit | Attending: Neurological Surgery | Admitting: Neurological Surgery

## 2016-04-18 ENCOUNTER — Encounter (HOSPITAL_COMMUNITY): Payer: Self-pay

## 2016-04-18 DIAGNOSIS — Z01812 Encounter for preprocedural laboratory examination: Secondary | ICD-10-CM | POA: Insufficient documentation

## 2016-04-18 DIAGNOSIS — Z0183 Encounter for blood typing: Secondary | ICD-10-CM | POA: Insufficient documentation

## 2016-04-18 HISTORY — DX: Personal history of pneumonia (recurrent): Z87.01

## 2016-04-18 HISTORY — DX: Family history of other specified conditions: Z84.89

## 2016-04-18 HISTORY — DX: Depression, unspecified: F32.A

## 2016-04-18 HISTORY — DX: Personal history of other diseases of the respiratory system: Z87.09

## 2016-04-18 HISTORY — DX: Personal history of urinary calculi: Z87.442

## 2016-04-18 HISTORY — DX: Major depressive disorder, single episode, unspecified: F32.9

## 2016-04-18 HISTORY — DX: Anxiety disorder, unspecified: F41.9

## 2016-04-18 LAB — COMPREHENSIVE METABOLIC PANEL
ALT: 29 U/L (ref 14–54)
ANION GAP: 10 (ref 5–15)
AST: 26 U/L (ref 15–41)
Albumin: 3.9 g/dL (ref 3.5–5.0)
Alkaline Phosphatase: 56 U/L (ref 38–126)
BUN: 15 mg/dL (ref 6–20)
CALCIUM: 9.9 mg/dL (ref 8.9–10.3)
CHLORIDE: 106 mmol/L (ref 101–111)
CO2: 24 mmol/L (ref 22–32)
Creatinine, Ser: 0.78 mg/dL (ref 0.44–1.00)
Glucose, Bld: 159 mg/dL — ABNORMAL HIGH (ref 65–99)
Potassium: 3.8 mmol/L (ref 3.5–5.1)
SODIUM: 140 mmol/L (ref 135–145)
Total Bilirubin: 0.6 mg/dL (ref 0.3–1.2)
Total Protein: 6.5 g/dL (ref 6.5–8.1)

## 2016-04-18 LAB — CBC WITH DIFFERENTIAL/PLATELET
BASOS ABS: 0.1 10*3/uL (ref 0.0–0.1)
BASOS PCT: 1 %
EOS ABS: 0.3 10*3/uL (ref 0.0–0.7)
EOS PCT: 4 %
HCT: 40.5 % (ref 36.0–46.0)
Hemoglobin: 13.3 g/dL (ref 12.0–15.0)
LYMPHS ABS: 1.8 10*3/uL (ref 0.7–4.0)
LYMPHS PCT: 30 %
MCH: 29.4 pg (ref 26.0–34.0)
MCHC: 32.8 g/dL (ref 30.0–36.0)
MCV: 89.6 fL (ref 78.0–100.0)
Monocytes Absolute: 0.5 10*3/uL (ref 0.1–1.0)
Monocytes Relative: 8 %
NEUTROS PCT: 57 %
Neutro Abs: 3.3 10*3/uL (ref 1.7–7.7)
PLATELETS: 186 10*3/uL (ref 150–400)
RBC: 4.52 MIL/uL (ref 3.87–5.11)
RDW: 14.1 % (ref 11.5–15.5)
WBC: 5.9 10*3/uL (ref 4.0–10.5)

## 2016-04-18 LAB — SURGICAL PCR SCREEN
MRSA, PCR: NEGATIVE
Staphylococcus aureus: POSITIVE — AB

## 2016-04-18 LAB — TYPE AND SCREEN
ABO/RH(D): B POS
Antibody Screen: NEGATIVE

## 2016-04-18 LAB — PROTIME-INR
INR: 0.99
Prothrombin Time: 13.1 seconds (ref 11.4–15.2)

## 2016-04-18 LAB — GLUCOSE, CAPILLARY: Glucose-Capillary: 148 mg/dL — ABNORMAL HIGH (ref 65–99)

## 2016-04-18 NOTE — Progress Notes (Signed)
PCP - Dr. Daniel NonesBert Klein Cardiologist - denies  EKG - 12/05/15 EKG - 02/19/16 - Care everwyere CXR - 12/05/15  Echo- 02/27/16 - Care Everywhere Stress test- 01/2015 - care everywhere Cardiac cath - pt. States she had a cardiac cath over 10 years ago by Dr. Welton FlakesKhan but does not know where it was completed  Patient denies chest pain and shortness of breath at PAT appointment.    Patient informed nurse that she has not checked her blood sugar in over a month and does not know what her fasting glucose is.  Patient encouraged to check blood sugar 4 times a day, 2 days prior to surgery and instructed not to take Metformin the morning of surgery.

## 2016-04-18 NOTE — Progress Notes (Signed)
PCR negative MRSA, positive MSSA. Pt notified, prescription called to pharmacy

## 2016-04-19 LAB — HEMOGLOBIN A1C
Hgb A1c MFr Bld: 6.6 % — ABNORMAL HIGH (ref 4.8–5.6)
Mean Plasma Glucose: 143 mg/dL

## 2016-04-24 NOTE — Anesthesia Preprocedure Evaluation (Addendum)
Anesthesia Evaluation  Patient identified by MRN, date of birth, ID band Patient awake    Reviewed: Allergy & Precautions, NPO status , Patient's Chart, lab work & pertinent test results, reviewed documented beta blocker date and time   History of Anesthesia Complications (+) PONV and history of anesthetic complications  Airway Mallampati: III  TM Distance: >3 FB Neck ROM: Full    Dental  (+) Teeth Intact, Dental Advisory Given   Pulmonary sleep apnea and Continuous Positive Airway Pressure Ventilation ,    breath sounds clear to auscultation       Cardiovascular hypertension, Pt. on medications and Pt. on home beta blockers  Rhythm:Regular Rate:Normal     Neuro/Psych  Headaches, PSYCHIATRIC DISORDERS Depression  Neuromuscular disease    GI/Hepatic negative GI ROS, Neg liver ROS,   Endo/Other  diabetes, Type 2, Oral Hypoglycemic AgentsHypothyroidism   Renal/GU Renal disease  negative genitourinary   Musculoskeletal  (+) Arthritis ,   Abdominal (+) + obese,   Peds  Hematology negative hematology ROS (+)   Anesthesia Other Findings   Reproductive/Obstetrics negative OB ROS                            Anesthesia Physical  Anesthesia Plan  ASA: III  Anesthesia Plan: General   Post-op Pain Management:    Induction: Intravenous  Airway Management Planned: Oral ETT  Additional Equipment:   Intra-op Plan:   Post-operative Plan: Extubation in OR  Informed Consent: I have reviewed the patients History and Physical, chart, labs and discussed the procedure including the risks, benefits and alternatives for the proposed anesthesia with the patient or authorized representative who has indicated his/her understanding and acceptance.   Dental advisory given  Plan Discussed with: CRNA  Anesthesia Plan Comments:         Anesthesia Quick Evaluation

## 2016-04-25 ENCOUNTER — Inpatient Hospital Stay (HOSPITAL_COMMUNITY)
Admission: RE | Admit: 2016-04-25 | Discharge: 2016-04-27 | DRG: 460 | Disposition: A | Discharge status: Home / Self Care | Payer: Medicare HMO | Source: Ambulatory Visit | Attending: Neurological Surgery | Admitting: Neurological Surgery

## 2016-04-25 ENCOUNTER — Encounter (HOSPITAL_COMMUNITY)
Admission: RE | Disposition: A | Discharge status: Home / Self Care | Payer: Self-pay | Source: Ambulatory Visit | Attending: Neurological Surgery

## 2016-04-25 ENCOUNTER — Inpatient Hospital Stay (HOSPITAL_COMMUNITY): Payer: Medicare HMO | Admitting: Anesthesiology

## 2016-04-25 ENCOUNTER — Encounter (HOSPITAL_COMMUNITY): Payer: Self-pay | Admitting: Neurological Surgery

## 2016-04-25 ENCOUNTER — Inpatient Hospital Stay (HOSPITAL_COMMUNITY): Payer: Medicare HMO

## 2016-04-25 DIAGNOSIS — M48061 Spinal stenosis, lumbar region without neurogenic claudication: Principal | ICD-10-CM | POA: Diagnosis present

## 2016-04-25 DIAGNOSIS — K760 Fatty (change of) liver, not elsewhere classified: Secondary | ICD-10-CM | POA: Diagnosis present

## 2016-04-25 DIAGNOSIS — G4733 Obstructive sleep apnea (adult) (pediatric): Secondary | ICD-10-CM | POA: Diagnosis present

## 2016-04-25 DIAGNOSIS — E669 Obesity, unspecified: Secondary | ICD-10-CM | POA: Diagnosis present

## 2016-04-25 DIAGNOSIS — G2581 Restless legs syndrome: Secondary | ICD-10-CM | POA: Diagnosis present

## 2016-04-25 DIAGNOSIS — Z888 Allergy status to other drugs, medicaments and biological substances status: Secondary | ICD-10-CM

## 2016-04-25 DIAGNOSIS — R2689 Other abnormalities of gait and mobility: Secondary | ICD-10-CM

## 2016-04-25 DIAGNOSIS — Z7984 Long term (current) use of oral hypoglycemic drugs: Secondary | ICD-10-CM | POA: Diagnosis not present

## 2016-04-25 DIAGNOSIS — H409 Unspecified glaucoma: Secondary | ICD-10-CM | POA: Diagnosis present

## 2016-04-25 DIAGNOSIS — F329 Major depressive disorder, single episode, unspecified: Secondary | ICD-10-CM | POA: Diagnosis present

## 2016-04-25 DIAGNOSIS — F419 Anxiety disorder, unspecified: Secondary | ICD-10-CM | POA: Diagnosis present

## 2016-04-25 DIAGNOSIS — Z79891 Long term (current) use of opiate analgesic: Secondary | ICD-10-CM | POA: Diagnosis not present

## 2016-04-25 DIAGNOSIS — M1990 Unspecified osteoarthritis, unspecified site: Secondary | ICD-10-CM | POA: Diagnosis present

## 2016-04-25 DIAGNOSIS — I10 Essential (primary) hypertension: Secondary | ICD-10-CM | POA: Diagnosis present

## 2016-04-25 DIAGNOSIS — E039 Hypothyroidism, unspecified: Secondary | ICD-10-CM | POA: Diagnosis present

## 2016-04-25 DIAGNOSIS — Z419 Encounter for procedure for purposes other than remedying health state, unspecified: Secondary | ICD-10-CM

## 2016-04-25 DIAGNOSIS — E119 Type 2 diabetes mellitus without complications: Secondary | ICD-10-CM | POA: Diagnosis present

## 2016-04-25 DIAGNOSIS — Z79899 Other long term (current) drug therapy: Secondary | ICD-10-CM

## 2016-04-25 DIAGNOSIS — Z981 Arthrodesis status: Secondary | ICD-10-CM

## 2016-04-25 DIAGNOSIS — Z86010 Personal history of colonic polyps: Secondary | ICD-10-CM | POA: Diagnosis not present

## 2016-04-25 DIAGNOSIS — M5126 Other intervertebral disc displacement, lumbar region: Secondary | ICD-10-CM | POA: Diagnosis present

## 2016-04-25 HISTORY — PX: MAXIMUM ACCESS (MAS)POSTERIOR LUMBAR INTERBODY FUSION (PLIF) 1 LEVEL: SHX6368

## 2016-04-25 LAB — GLUCOSE, CAPILLARY
GLUCOSE-CAPILLARY: 131 mg/dL — AB (ref 65–99)
GLUCOSE-CAPILLARY: 135 mg/dL — AB (ref 65–99)
GLUCOSE-CAPILLARY: 236 mg/dL — AB (ref 65–99)

## 2016-04-25 SURGERY — FOR MAXIMUM ACCESS (MAS) POSTERIOR LUMBAR INTERBODY FUSION (PLIF) 1 LEVEL
Anesthesia: General | Site: Back

## 2016-04-25 MED ORDER — HYDROMORPHONE HCL 1 MG/ML IJ SOLN
INTRAMUSCULAR | Status: AC
  Filled 2016-04-25: qty 0.5

## 2016-04-25 MED ORDER — METOPROLOL SUCCINATE ER 25 MG PO TB24
25.0000 mg | ORAL_TABLET | Freq: Every day | ORAL | Status: DC
  Administered 2016-04-26 – 2016-04-27 (×2): 25 mg via ORAL
  Filled 2016-04-25 (×2): qty 1

## 2016-04-25 MED ORDER — CEFAZOLIN SODIUM-DEXTROSE 2-4 GM/100ML-% IV SOLN
2.0000 g | INTRAVENOUS | Status: AC
  Administered 2016-04-25: 2 g via INTRAVENOUS

## 2016-04-25 MED ORDER — THROMBIN 20000 UNITS EX SOLR
CUTANEOUS | Status: DC | PRN
  Administered 2016-04-25: 20 mL via TOPICAL

## 2016-04-25 MED ORDER — FENTANYL CITRATE (PF) 100 MCG/2ML IJ SOLN
INTRAMUSCULAR | Status: DC | PRN
  Administered 2016-04-25 (×2): 50 ug via INTRAVENOUS
  Administered 2016-04-25: 100 ug via INTRAVENOUS

## 2016-04-25 MED ORDER — LIDOCAINE 2% (20 MG/ML) 5 ML SYRINGE
INTRAMUSCULAR | Status: AC
  Filled 2016-04-25: qty 5

## 2016-04-25 MED ORDER — PHENYLEPHRINE HCL 10 MG/ML IJ SOLN
INTRAMUSCULAR | Status: DC | PRN
  Administered 2016-04-25 (×3): 120 ug via INTRAVENOUS

## 2016-04-25 MED ORDER — ACETAMINOPHEN 325 MG PO TABS: 650.0000 mg | ORAL_TABLET | ORAL | Status: DC | PRN

## 2016-04-25 MED ORDER — FENTANYL CITRATE (PF) 100 MCG/2ML IJ SOLN
INTRAMUSCULAR | Status: AC
  Filled 2016-04-25: qty 4

## 2016-04-25 MED ORDER — OXYCODONE HCL 5 MG PO TABS
10.0000 mg | ORAL_TABLET | ORAL | Status: DC | PRN
  Administered 2016-04-25 – 2016-04-27 (×7): 10 mg via ORAL
  Filled 2016-04-25 (×6): qty 2

## 2016-04-25 MED ORDER — CELECOXIB 200 MG PO CAPS
200.0000 mg | ORAL_CAPSULE | Freq: Two times a day (BID) | ORAL | Status: DC
  Administered 2016-04-26 – 2016-04-27 (×3): 200 mg via ORAL
  Filled 2016-04-25 (×4): qty 1

## 2016-04-25 MED ORDER — MENTHOL 3 MG MT LOZG: 1.0000 | LOZENGE | OROMUCOSAL | Status: DC | PRN

## 2016-04-25 MED ORDER — AZELASTINE HCL 0.1 % NA SOLN
1.0000 | Freq: Two times a day (BID) | NASAL | Status: DC
  Administered 2016-04-25 – 2016-04-27 (×3): 1 via NASAL
  Filled 2016-04-25: qty 30

## 2016-04-25 MED ORDER — PROMETHAZINE HCL 25 MG/ML IJ SOLN: 6.2500 mg | INTRAMUSCULAR | Status: DC | PRN

## 2016-04-25 MED ORDER — GLYCOPYRROLATE 0.2 MG/ML IJ SOLN
INTRAMUSCULAR | Status: DC | PRN
  Administered 2016-04-25: 0.4 mg via INTRAVENOUS
  Administered 2016-04-25: 0.2 mg via INTRAVENOUS

## 2016-04-25 MED ORDER — VANCOMYCIN HCL 1000 MG IV SOLR
INTRAVENOUS | Status: DC | PRN
  Administered 2016-04-25: 1000 mg via TOPICAL

## 2016-04-25 MED ORDER — GABAPENTIN 400 MG PO CAPS
800.0000 mg | ORAL_CAPSULE | Freq: Two times a day (BID) | ORAL | Status: DC
  Administered 2016-04-25 – 2016-04-27 (×4): 800 mg via ORAL
  Filled 2016-04-25 (×4): qty 2

## 2016-04-25 MED ORDER — HYDROMORPHONE HCL 1 MG/ML IJ SOLN
0.2500 mg | INTRAMUSCULAR | Status: DC | PRN
  Administered 2016-04-25 (×4): 0.5 mg via INTRAVENOUS

## 2016-04-25 MED ORDER — BUPIVACAINE HCL (PF) 0.25 % IJ SOLN
INTRAMUSCULAR | Status: AC
  Filled 2016-04-25: qty 30

## 2016-04-25 MED ORDER — BUPIVACAINE HCL (PF) 0.25 % IJ SOLN
INTRAMUSCULAR | Status: DC | PRN
  Administered 2016-04-25: 5 mL

## 2016-04-25 MED ORDER — NEOSTIGMINE METHYLSULFATE 10 MG/10ML IV SOLN
INTRAVENOUS | Status: DC | PRN
  Administered 2016-04-25: 3 mg via INTRAVENOUS

## 2016-04-25 MED ORDER — SODIUM CHLORIDE 0.9 % IR SOLN
Status: DC | PRN
  Administered 2016-04-25: 500 mL

## 2016-04-25 MED ORDER — GABAPENTIN 800 MG PO TABS
800.0000 mg | ORAL_TABLET | Freq: Two times a day (BID) | ORAL | Status: DC
  Filled 2016-04-25: qty 1

## 2016-04-25 MED ORDER — CHLORHEXIDINE GLUCONATE CLOTH 2 % EX PADS: 6.0000 | MEDICATED_PAD | Freq: Once | CUTANEOUS | Status: DC

## 2016-04-25 MED ORDER — THROMBIN 5000 UNITS EX SOLR
CUTANEOUS | Status: AC
  Filled 2016-04-25: qty 5000

## 2016-04-25 MED ORDER — PROPOFOL 10 MG/ML IV BOLUS
INTRAVENOUS | Status: AC
  Filled 2016-04-25: qty 20

## 2016-04-25 MED ORDER — METHOCARBAMOL 500 MG PO TABS
ORAL_TABLET | ORAL | Status: AC
  Filled 2016-04-25: qty 1

## 2016-04-25 MED ORDER — PHENYLEPHRINE HCL 10 MG/ML IJ SOLN
INTRAVENOUS | Status: DC | PRN
  Administered 2016-04-25: 40 ug/min via INTRAVENOUS

## 2016-04-25 MED ORDER — EPHEDRINE SULFATE 50 MG/ML IJ SOLN
INTRAMUSCULAR | Status: DC | PRN
  Administered 2016-04-25 (×2): 5 mg via INTRAVENOUS

## 2016-04-25 MED ORDER — MEPERIDINE HCL 25 MG/ML IJ SOLN: 6.2500 mg | INTRAMUSCULAR | Status: DC | PRN

## 2016-04-25 MED ORDER — THROMBIN 5000 UNITS EX SOLR
OROMUCOSAL | Status: DC | PRN
  Administered 2016-04-25: 5 mL via TOPICAL

## 2016-04-25 MED ORDER — LOSARTAN POTASSIUM 50 MG PO TABS
50.0000 mg | ORAL_TABLET | Freq: Every evening | ORAL | Status: DC
  Administered 2016-04-25 – 2016-04-26 (×2): 50 mg via ORAL
  Filled 2016-04-25 (×2): qty 1

## 2016-04-25 MED ORDER — MIDAZOLAM HCL 2 MG/2ML IJ SOLN
INTRAMUSCULAR | Status: AC
  Filled 2016-04-25: qty 2

## 2016-04-25 MED ORDER — SODIUM CHLORIDE 0.9% FLUSH
3.0000 mL | Freq: Two times a day (BID) | INTRAVENOUS | Status: DC
  Administered 2016-04-25: 23:00:00 via INTRAVENOUS
  Administered 2016-04-27: 3 mL via INTRAVENOUS

## 2016-04-25 MED ORDER — METHOCARBAMOL 1000 MG/10ML IJ SOLN
500.0000 mg | Freq: Four times a day (QID) | INTRAMUSCULAR | Status: DC | PRN
  Filled 2016-04-25: qty 5

## 2016-04-25 MED ORDER — THROMBIN 20000 UNITS EX SOLR
CUTANEOUS | Status: AC
  Filled 2016-04-25: qty 20000

## 2016-04-25 MED ORDER — SODIUM CHLORIDE 0.9% FLUSH: 3.0000 mL | INTRAVENOUS | Status: DC | PRN

## 2016-04-25 MED ORDER — SULINDAC 200 MG PO TABS
200.0000 mg | ORAL_TABLET | Freq: Two times a day (BID) | ORAL | Status: DC
  Filled 2016-04-25: qty 1

## 2016-04-25 MED ORDER — METHOCARBAMOL 500 MG PO TABS
500.0000 mg | ORAL_TABLET | Freq: Four times a day (QID) | ORAL | Status: DC | PRN
  Administered 2016-04-25 (×2): 500 mg via ORAL
  Filled 2016-04-25: qty 1

## 2016-04-25 MED ORDER — ROCURONIUM BROMIDE 100 MG/10ML IV SOLN
INTRAVENOUS | Status: DC | PRN
  Administered 2016-04-25: 20 mg via INTRAVENOUS
  Administered 2016-04-25: 50 mg via INTRAVENOUS

## 2016-04-25 MED ORDER — MORPHINE SULFATE (PF) 2 MG/ML IV SOLN
1.0000 mg | INTRAVENOUS | Status: DC | PRN
  Administered 2016-04-25 – 2016-04-26 (×2): 2 mg via INTRAVENOUS
  Administered 2016-04-26: 4 mg via INTRAVENOUS
  Filled 2016-04-25 (×2): qty 1
  Filled 2016-04-25 (×2): qty 2

## 2016-04-25 MED ORDER — ONDANSETRON HCL 4 MG/2ML IJ SOLN
4.0000 mg | INTRAMUSCULAR | Status: DC | PRN
  Administered 2016-04-26: 4 mg via INTRAVENOUS
  Filled 2016-04-25: qty 2

## 2016-04-25 MED ORDER — POTASSIUM CHLORIDE IN NACL 20-0.9 MEQ/L-% IV SOLN
INTRAVENOUS | Status: DC
  Administered 2016-04-25: 20:00:00 via INTRAVENOUS
  Filled 2016-04-25 (×2): qty 1000

## 2016-04-25 MED ORDER — EPHEDRINE 5 MG/ML INJ
INTRAVENOUS | Status: AC
  Filled 2016-04-25: qty 30

## 2016-04-25 MED ORDER — LIDOCAINE HCL (CARDIAC) 20 MG/ML IV SOLN
INTRAVENOUS | Status: DC | PRN
  Administered 2016-04-25: 100 mg via INTRAVENOUS

## 2016-04-25 MED ORDER — LEVOTHYROXINE SODIUM 112 MCG PO TABS
112.0000 ug | ORAL_TABLET | Freq: Every day | ORAL | Status: DC
  Administered 2016-04-26 – 2016-04-27 (×2): 112 ug via ORAL
  Filled 2016-04-25 (×2): qty 1

## 2016-04-25 MED ORDER — ONDANSETRON HCL 4 MG/2ML IJ SOLN
INTRAMUSCULAR | Status: DC | PRN
  Administered 2016-04-25: 4 mg via INTRAVENOUS

## 2016-04-25 MED ORDER — PHENYLEPHRINE 40 MCG/ML (10ML) SYRINGE FOR IV PUSH (FOR BLOOD PRESSURE SUPPORT)
PREFILLED_SYRINGE | INTRAVENOUS | Status: AC
  Filled 2016-04-25: qty 30

## 2016-04-25 MED ORDER — INSULIN ASPART 100 UNIT/ML ~~LOC~~ SOLN
0.0000 [IU] | Freq: Three times a day (TID) | SUBCUTANEOUS | Status: DC
  Administered 2016-04-26 (×3): 3 [IU] via SUBCUTANEOUS
  Administered 2016-04-27: 2 [IU] via SUBCUTANEOUS

## 2016-04-25 MED ORDER — LACTATED RINGERS IV SOLN
INTRAVENOUS | Status: DC
  Administered 2016-04-25: 09:00:00 via INTRAVENOUS

## 2016-04-25 MED ORDER — VANCOMYCIN HCL 1000 MG IV SOLR
INTRAVENOUS | Status: AC
  Filled 2016-04-25: qty 1000

## 2016-04-25 MED ORDER — HYDROMORPHONE HCL 1 MG/ML IJ SOLN
INTRAMUSCULAR | Status: AC
  Filled 2016-04-25: qty 1

## 2016-04-25 MED ORDER — METFORMIN HCL 500 MG PO TABS
500.0000 mg | ORAL_TABLET | Freq: Two times a day (BID) | ORAL | Status: DC
  Administered 2016-04-26 – 2016-04-27 (×3): 500 mg via ORAL
  Filled 2016-04-25 (×3): qty 1

## 2016-04-25 MED ORDER — SODIUM CHLORIDE 0.9 % IV SOLN: 250.0000 mL | INTRAVENOUS | Status: DC

## 2016-04-25 MED ORDER — CEFAZOLIN IN D5W 1 GM/50ML IV SOLN
1.0000 g | Freq: Three times a day (TID) | INTRAVENOUS | Status: AC
  Administered 2016-04-25 – 2016-04-26 (×2): 1 g via INTRAVENOUS
  Filled 2016-04-25 (×2): qty 50

## 2016-04-25 MED ORDER — DULOXETINE HCL 60 MG PO CPEP
90.0000 mg | ORAL_CAPSULE | Freq: Every day | ORAL | Status: DC
  Administered 2016-04-26 – 2016-04-27 (×2): 90 mg via ORAL
  Filled 2016-04-25 (×2): qty 1

## 2016-04-25 MED ORDER — LATANOPROST 0.005 % OP SOLN
1.0000 [drp] | Freq: Every day | OPHTHALMIC | Status: DC
  Administered 2016-04-25 – 2016-04-26 (×2): 1 [drp] via OPHTHALMIC
  Filled 2016-04-25: qty 2.5

## 2016-04-25 MED ORDER — ALBUMIN HUMAN 5 % IV SOLN
INTRAVENOUS | Status: DC | PRN
  Administered 2016-04-25: 11:00:00 via INTRAVENOUS

## 2016-04-25 MED ORDER — ROPINIROLE HCL 1 MG PO TABS
4.0000 mg | ORAL_TABLET | Freq: Two times a day (BID) | ORAL | Status: DC
  Administered 2016-04-25 – 2016-04-27 (×4): 4 mg via ORAL
  Filled 2016-04-25 (×4): qty 4

## 2016-04-25 MED ORDER — ACETAMINOPHEN 650 MG RE SUPP: 650.0000 mg | RECTAL | Status: DC | PRN

## 2016-04-25 MED ORDER — CEFAZOLIN SODIUM-DEXTROSE 2-4 GM/100ML-% IV SOLN
INTRAVENOUS | Status: AC
  Filled 2016-04-25: qty 100

## 2016-04-25 MED ORDER — OXYCODONE HCL 5 MG PO TABS
ORAL_TABLET | ORAL | Status: AC
  Filled 2016-04-25: qty 2

## 2016-04-25 MED ORDER — PHENOL 1.4 % MT LIQD: 1.0000 | OROMUCOSAL | Status: DC | PRN

## 2016-04-25 MED ORDER — PROPOFOL 10 MG/ML IV BOLUS
INTRAVENOUS | Status: DC | PRN
  Administered 2016-04-25: 160 mg via INTRAVENOUS

## 2016-04-25 SURGICAL SUPPLY — 64 items
BAG DECANTER FOR FLEXI CONT (MISCELLANEOUS) ×3 IMPLANT
BASKET BONE COLLECTION (BASKET) ×3 IMPLANT
BENZOIN TINCTURE PRP APPL 2/3 (GAUZE/BANDAGES/DRESSINGS) ×3 IMPLANT
BIT DRILL PLIF MAS DISP 5.5MM (DRILL) ×1 IMPLANT
BLADE CLIPPER SURG (BLADE) ×3 IMPLANT
BONE MATRIX OSTEOCEL PRO SM (Bone Implant) ×6 IMPLANT
BUR MATCHSTICK NEURO 3.0 LAGG (BURR) ×3 IMPLANT
CAGE MAS PLIF 9X9X23-8 LUMBAR (Cage) ×6 IMPLANT
CANISTER SUCT 3000ML PPV (MISCELLANEOUS) ×3 IMPLANT
CAP RELINE MOD TULIP RMM (Cap) ×6 IMPLANT
CARTRIDGE OIL MAESTRO DRILL (MISCELLANEOUS) ×1 IMPLANT
CLOSURE WOUND 1/2 X4 (GAUZE/BANDAGES/DRESSINGS) ×1
CONT SPEC 4OZ CLIKSEAL STRL BL (MISCELLANEOUS) ×3 IMPLANT
COVER BACK TABLE 24X17X13 BIG (DRAPES) IMPLANT
COVER BACK TABLE 60X90IN (DRAPES) ×3 IMPLANT
DIFFUSER DRILL AIR PNEUMATIC (MISCELLANEOUS) ×3 IMPLANT
DRAPE C-ARM 42X72 X-RAY (DRAPES) ×3 IMPLANT
DRAPE C-ARMOR (DRAPES) ×3 IMPLANT
DRAPE LAPAROTOMY 100X72X124 (DRAPES) ×3 IMPLANT
DRAPE POUCH INSTRU U-SHP 10X18 (DRAPES) ×3 IMPLANT
DRAPE SURG 17X23 STRL (DRAPES) ×3 IMPLANT
DRILL PLIF MAS DISP 5.5MM (DRILL) ×3
DRSG OPSITE POSTOP 4X8 (GAUZE/BANDAGES/DRESSINGS) ×3 IMPLANT
DURAPREP 26ML APPLICATOR (WOUND CARE) ×3 IMPLANT
ELECT REM PT RETURN 9FT ADLT (ELECTROSURGICAL) ×3
ELECTRODE REM PT RTRN 9FT ADLT (ELECTROSURGICAL) ×1 IMPLANT
EVACUATOR 1/8 PVC DRAIN (DRAIN) ×3 IMPLANT
GAUZE SPONGE 4X4 16PLY XRAY LF (GAUZE/BANDAGES/DRESSINGS) IMPLANT
GLOVE BIO SURGEON STRL SZ8 (GLOVE) ×12 IMPLANT
GLOVE BIOGEL PI IND STRL 6.5 (GLOVE) ×1 IMPLANT
GLOVE BIOGEL PI IND STRL 7.5 (GLOVE) ×3 IMPLANT
GLOVE BIOGEL PI INDICATOR 6.5 (GLOVE) ×2
GLOVE BIOGEL PI INDICATOR 7.5 (GLOVE) ×6
GLOVE INDICATOR 8.5 STRL (GLOVE) ×3 IMPLANT
GLOVE SURG SS PI 6.5 STRL IVOR (GLOVE) ×6 IMPLANT
GLOVE SURG SS PI 7.0 STRL IVOR (GLOVE) ×15 IMPLANT
GOWN STRL REUS W/ TWL LRG LVL3 (GOWN DISPOSABLE) ×3 IMPLANT
GOWN STRL REUS W/ TWL XL LVL3 (GOWN DISPOSABLE) ×2 IMPLANT
GOWN STRL REUS W/TWL 2XL LVL3 (GOWN DISPOSABLE) IMPLANT
GOWN STRL REUS W/TWL LRG LVL3 (GOWN DISPOSABLE) ×6
GOWN STRL REUS W/TWL XL LVL3 (GOWN DISPOSABLE) ×4
HEMOSTAT POWDER KIT SURGIFOAM (HEMOSTASIS) IMPLANT
HEMOSTAT POWDER SURGIFOAM 1G (HEMOSTASIS) ×3 IMPLANT
KIT BASIN OR (CUSTOM PROCEDURE TRAY) ×3 IMPLANT
KIT ROOM TURNOVER OR (KITS) ×3 IMPLANT
NEEDLE HYPO 25X1 1.5 SAFETY (NEEDLE) ×3 IMPLANT
NS IRRIG 1000ML POUR BTL (IV SOLUTION) ×3 IMPLANT
OIL CARTRIDGE MAESTRO DRILL (MISCELLANEOUS) ×3
PACK LAMINECTOMY NEURO (CUSTOM PROCEDURE TRAY) ×3 IMPLANT
PAD ARMBOARD 7.5X6 YLW CONV (MISCELLANEOUS) ×9 IMPLANT
ROD RELINE O COCR 5.0X90MM (Rod) ×6 IMPLANT
SCREW SHANK RELINE MOD 5.5X35 (Screw) ×4 IMPLANT
SPONGE LAP 4X18 X RAY DECT (DISPOSABLE) IMPLANT
SPONGE SURGIFOAM ABS GEL 100 (HEMOSTASIS) ×3 IMPLANT
STRIP CLOSURE SKIN 1/2X4 (GAUZE/BANDAGES/DRESSINGS) ×2 IMPLANT
SUT VIC AB 0 CT1 18XCR BRD8 (SUTURE) ×1 IMPLANT
SUT VIC AB 0 CT1 8-18 (SUTURE) ×2
SUT VIC AB 2-0 CP2 18 (SUTURE) ×3 IMPLANT
SUT VIC AB 3-0 SH 8-18 (SUTURE) ×6 IMPLANT
SWABSTICK BENZOIN STERILE (MISCELLANEOUS) ×3 IMPLANT
TOWEL OR 17X24 6PK STRL BLUE (TOWEL DISPOSABLE) ×3 IMPLANT
TOWEL OR 17X26 10 PK STRL BLUE (TOWEL DISPOSABLE) ×3 IMPLANT
TRAY FOLEY W/METER SILVER 16FR (SET/KITS/TRAYS/PACK) ×3 IMPLANT
WATER STERILE IRR 1000ML POUR (IV SOLUTION) ×3 IMPLANT

## 2016-04-25 NOTE — Anesthesia Postprocedure Evaluation (Signed)
Anesthesia Post Note  Patient: Jessica Cole  Procedure(s) Performed: Procedure(s) (LRB): LUMBAR FOUR-FIVE  MAXIMUM ACCESS (MAS) POSTERIOR LUMBAR INTERBODY FUSION (PLIF) with extension of instrumentation LUMBAR TWO-FIVE (N/A)  Patient location during evaluation: PACU Anesthesia Type: General Level of consciousness: sedated and patient cooperative Pain management: pain level controlled Vital Signs Assessment: post-procedure vital signs reviewed and stable Respiratory status: spontaneous breathing Cardiovascular status: stable Anesthetic complications: no       Last Vitals:  Vitals:   04/25/16 1515 04/25/16 1525  BP:  (!) 152/78  Pulse: (!) 57 (!) 58  Resp: 14 13  Temp:      Last Pain:  Vitals:   04/25/16 1555  TempSrc:   PainSc: Asleep                 Lewie LoronJohn Rosalina Dingwall

## 2016-04-25 NOTE — Anesthesia Procedure Notes (Signed)
Procedure Name: Intubation Date/Time: 04/25/2016 11:05 AM Performed by: Adonis HousekeeperNGELL, Karisma Meiser M Pre-anesthesia Checklist: Patient identified, Emergency Drugs available, Suction available and Patient being monitored Patient Re-evaluated:Patient Re-evaluated prior to inductionOxygen Delivery Method: Circle system utilized Preoxygenation: Pre-oxygenation with 100% oxygen Intubation Type: IV induction Ventilation: Mask ventilation without difficulty Laryngoscope Size: Mac and 3 Grade View: Grade III Tube type: Oral Tube size: 7.0 mm Number of attempts: 2 Airway Equipment and Method: Stylet Placement Confirmation: ETT inserted through vocal cords under direct vision,  positive ETCO2 and breath sounds checked- equal and bilateral Secured at: 23 cm Tube secured with: Tape Dental Injury: Teeth and Oropharynx as per pre-operative assessment

## 2016-04-25 NOTE — Op Note (Signed)
04/25/2016  1:49 PM  PATIENT:  Jessica PutnamGail T Salaam  65 y.o. female  PRE-OPERATIVE DIAGNOSIS:  Adjacent level disc protrusion L4-5 left with spinal stenosis and left leg pain  POST-OPERATIVE DIAGNOSIS:  Same  PROCEDURE:   1. Decompressive lumbar laminectomy L4-5 requiring more work than would be required for a simple exposure of the disk for PLIF in order to adequately decompress the neural elements and address the spinal stenosis 2. Posterior lumbar interbody fusion L4-5 using PEEK interbody cages packed with morcellized allograft and autograft 3. Posterior fixation L2-L5 inclusive using cortical pedicle screws.    SURGEON:  Marikay Alaravid Shekera Beavers, MD  ASSISTANTS: Dr. Wynetta Emeryram  ANESTHESIA:  General  EBL: 100 ml  Total I/O In: 1450 [I.V.:1200; IV Piggyback:250] Out: 275 [Urine:175; Blood:100]  BLOOD ADMINISTERED:none  DRAINS: None  INDICATION FOR PROCEDURE: This patient underwent a previous L2-L4 instrumented fusion. She presented with severe left leg pain in an L4 distribution. CT scan showed a large extra foraminal disc herniation at L4-5 the left compressing the left L4 nerve root. She tried medical management. Her pain was debilitating. I recommended decompression and instrumented fusion at L4-5 with extension of hardware from L2-L5. Patient understood the risks, benefits, and alternatives and potential outcomes and wished to proceed.  PROCEDURE DETAILS:  The patient was brought to the operating room. After induction of generalized endotracheal anesthesia the patient was rolled into the prone position on chest rolls and all pressure points were padded. The patient's lumbar region was cleaned and then prepped with DuraPrep and draped in the usual sterile fashion. Anesthesia was injected and then a dorsal midline incision was made and carried down to the lumbosacral fascia. The fascia was opened and the paraspinous musculature was taken down in a subperiosteal fashion to expose the previously placed  hardware from L2-L4. The locking caps were removed and the rods were removed. The screws had good purchase. A self-retaining retractor was placed. Intraoperative fluoroscopy confirmed my level after I exposed the L4-5 interspace.  I then turned my attention to the decompression and complete lumbar laminectomies, hemi- facetectomies, and foraminotomies were performed at L4-5 bilaterally. The patient had significant spinal stenosis and this required more work than would be required for a simple exposure of the disc for posterior lumbar interbody fusion. It was a large extra foraminal disc herniation at L4-5 on the left that I spent considerable time removing from underneath the L4 nerve root. Much more generous decompression was undertaken in order to adequately decompress the neural elements and address the patient's leg pain. The yellow ligament was removed to expose the underlying dura and nerve roots, and generous foraminotomies were performed to adequately decompress the neural elements. Both the exiting and traversing nerve roots were decompressed on both sides until a coronary dilator passed easily along the nerve roots. Once the decompression was complete, I turned my attention to the posterior lower lumbar interbody fusion. The epidural venous vasculature was coagulated and cut sharply. Disc space was incised and the initial discectomy was performed with pituitary rongeurs. The disc space was distracted with sequential distractors to a height of 10 mm. We then used a series of scrapers and shavers to prepare the endplates for fusion. The midline was prepared with Epstein curettes. Once the complete discectomy was finished, we packed an appropriate sized peek interbody cage with local autograft and morcellized allograft, gently retracted the nerve root, and tapped the cage into position at L4-5.  The midline between the cages was packed with morselized autograft and  allograft. We then turned our attention to  the placement of the lower pedicle screws. The pedicle screw entry zones were identified utilizing surface landmarks and fluoroscopy. I drilled into each pedicle utilizing the hand drill and EMG monitoring, and tapped each pedicle with the appropriate tap. We palpated with a ball probe to assure no break in the cortex. We then placed 5.5 x 35 mm cortical pedicle screws into the pedicles bilaterally at L5.  We then placed lordotic rods into the multiaxial screw heads of the pedicle screws from L2-L5 bilaterally and locked these in position with the locking caps and anti-torque device. We then checked our construct with AP and lateral fluoroscopy. Irrigated with copious amounts of bacitracin-containing saline solution.  Inspected the nerve roots once again to assure adequate decompression, lined to the dura with Gelfoam, and closed the muscle and the fascia with 0 Vicryl. Closed the subcutaneous tissues with 2-0 Vicryl and subcuticular tissues with 3-0 Vicryl. The skin was closed with benzoin and Steri-Strips. Dressing was then applied, the patient was awakened from general anesthesia and transported to the recovery room in stable condition. At the end of the procedure all sponge, needle and instrument counts were correct.   PLAN OF CARE: Admit to inpatient   PATIENT DISPOSITION:  PACU - hemodynamically stable.   Delay start of Pharmacological VTE agent (>24hrs) due to surgical blood loss or risk of bleeding:  yes

## 2016-04-25 NOTE — Progress Notes (Signed)
Pt received from PACU drowsy with no noted distress. She is on 2L of 02 via n/c. Denies shortness of breath or dyspnea.  Pt easily aroused  during assessment.  Able to follow commands and move all extremities. Pt oriented to room. Safety measures in place. Turned and repositioned for comfort. Family at bedside. Call bell within reach.

## 2016-04-25 NOTE — Transfer of Care (Signed)
Immediate Anesthesia Transfer of Care Note  Patient: Jessica Cole  Procedure(s) Performed: Procedure(s): LUMBAR FOUR-FIVE  MAXIMUM ACCESS (MAS) POSTERIOR LUMBAR INTERBODY FUSION (PLIF) with extension of instrumentation LUMBAR TWO-FIVE (N/A)  Patient Location: PACU  Anesthesia Type:General  Level of Consciousness: awake, alert , oriented and patient cooperative  Airway & Oxygen Therapy: Patient Spontanous Breathing and Patient connected to nasal cannula oxygen  Post-op Assessment: Report given to RN and Post -op Vital signs reviewed and stable  Post vital signs: Reviewed and stable  Last Vitals:  Vitals:   04/25/16 0830  BP: (!) 152/65  Pulse: 60  Resp: 18  Temp: 36.7 C    Last Pain:  Vitals:   04/25/16 0830  TempSrc: Oral  PainSc: 1       Patients Stated Pain Goal: 2 (04/25/16 0830)  Complications: No apparent anesthesia complications

## 2016-04-25 NOTE — H&P (Signed)
Subjective: Patient is a 65 y.o. female admitted for HNP. Onset of symptoms was several weeks ago, rapidly worsening since that time.  The pain is rated intense, unremitting, and is located at the across the lower back and radiates to LLE. The pain is described as aching and occurs all day. The symptoms have been progressive. Symptoms are exacerbated by exercise. 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 Anxiety   . Colon polyps   . Degenerative arthritis   . Depression   . Diabetes mellitus without complication (HCC)    Type II  . Environmental allergies   . Family history of adverse reaction to anesthesia    sister- PONV  . Fatty liver   . Glaucoma   . Headache(784.0)    HX  MIGRAINES  . History of bronchitis   . History of kidney stones   . History of pneumonia   . Hypertension   . Hypothyroidism   . Obesity   . OSA on CPAP   . Plantar fasciitis    right  . PONV (postoperative nausea and vomiting)   . Renal calculi   . Renal calculi   . RLS (restless legs syndrome) 08/23/2014    Past Surgical History:  Procedure Laterality Date  . ABDOMINAL HYSTERECTOMY     partial  . APPENDECTOMY    . Arthroscopic surgery, knee Left   . BREAST BIOPSY Right    several  . BUNIONECTOMY     LEFT   . COLONOSCOPY W/ POLYPECTOMY    . FOOT SURGERY     RIGHT    . KNEE ARTHROSCOPY W/ MENISCAL REPAIR Left   . LASIK    . MAXIMUM ACCESS (MAS)POSTERIOR LUMBAR INTERBODY FUSION (PLIF) 2 LEVEL N/A 12/13/2015   Procedure: Lumbar two-three - Lumbar three-four MAXIMUM ACCESS (MAS) POSTERIOR LUMBAR INTERBODY FUSION (PLIF)  ;  Surgeon: Tia Alertavid S Izza Bickle, MD;  [facility];  Service: Neurosurgery;  Laterality: N/A;  . SHOULDER SURGERY     RIGHT   . TONSILLECTOMY      Prior to Admission medications   Medication Sig Start Date End Date Taking? Authorizing Provider  azelastine (ASTELIN) 0.1 % nasal spray Place 1 spray into both nostrils 2 (two) times daily.  11/23/15   Yes Historical Provider, MD  B Complex Vitamins (VITAMIN B COMPLEX PO) Take 1 tablet by mouth daily.    Yes Historical Provider, MD  Coenzyme Q10 (CO Q 10) 100 MG CAPS Take 200 mg by mouth daily.    Yes Historical Provider, MD  diclofenac sodium (VOLTAREN) 1 % GEL Apply 4 g topically 2 (two) times daily. Patient taking differently: Apply 4 g topically daily as needed (for pain).  08/28/14  Yes Max T Hyatt, DPM  DULoxetine (CYMBALTA) 30 MG capsule Take 90 mg by mouth daily.    Yes Historical Provider, MD  estradiol (VIVELLE-DOT) 0.05 MG/24HR patch Place 1 patch onto the skin 2 (two) times a week.  11/07/15  Yes Historical Provider, MD  fexofenadine (ALLER-EASE) 180 MG tablet Take 180 mg by mouth daily.   Yes Historical Provider, MD  fluticasone (FLONASE) 50 MCG/ACT nasal spray Place 2 sprays into both nostrils 2 (two) times daily.  04/12/14  Yes Historical Provider, MD  gabapentin (NEURONTIN) 800 MG tablet Take 1 tablet (800 mg total) by mouth 2 (two) times daily. 01/22/16  Yes Butch PennyMegan Millikan, NP  Genistein (I-COOL FOR MENOPAUSE) 30 MG TABS Take 30 mg by mouth  every morning.   Yes Historical Provider, MD  latanoprost (XALATAN) 0.005 % ophthalmic solution Place 1 drop into both eyes at bedtime.    Yes Historical Provider, MD  levothyroxine (SYNTHROID, LEVOTHROID) 112 MCG tablet Take 112 mcg by mouth daily before breakfast.  11/07/15  Yes Historical Provider, MD  losartan (COZAAR) 50 MG tablet Take 50 mg by mouth every evening.    Yes Historical Provider, MD  metFORMIN (GLUCOPHAGE) 500 MG tablet Take 500 mg by mouth 2 (two) times daily with a meal.    Yes Historical Provider, MD  metoprolol succinate (TOPROL-XL) 25 MG 24 hr tablet TAKE ONE (1) TABLET BY MOUTH EVERY DAY Patient taking differently: TAKE ONE (1) TABLET (25 mg) BY MOUTH EVERY DAY 08/30/15  Yes York Spaniel, MD  naproxen sodium (ANAPROX) 220 MG tablet Take 440 mg by mouth daily as needed (pain).   Yes Historical Provider, MD  Omega-3 Fatty  Acids (FISH OIL ULTRA) 1000 MG CAPS Take 1,000 mg by mouth 2 (two) times daily.    Yes Historical Provider, MD  Oxycodone HCl 10 MG TABS Take 1 tablet (10 mg total) by mouth every 4 (four) hours as needed (for pain). Patient taking differently: Take 10-20 mg by mouth every 6 (six) hours as needed (for pain). Depends on pain level if patient takes 10-20 mg 12/16/15  Yes Lisbeth Renshaw, MD  Red Yeast Rice 600 MG CAPS Take 1,200 mg by mouth 2 (two) times daily.   Yes Historical Provider, MD  rOPINIRole (REQUIP) 4 MG tablet Take 1 tablet (4 mg total) by mouth 2 (two) times daily. 02/15/16  Yes York Spaniel, MD  sulindac (CLINORIL) 200 MG tablet Take 200 mg by mouth 2 (two) times daily. 02/04/16 02/03/17 Yes Historical Provider, MD  methocarbamol (ROBAXIN) 500 MG tablet Take 1 tablet (500 mg total) by mouth every 6 (six) hours as needed for muscle spasms. Patient not taking: Reported on 02/19/2016 12/16/15   Lisbeth Renshaw, MD  traZODone (DESYREL) 100 MG tablet TAKE ONE TO TWO TABLETS BY MOUTH AT BEDTIME FOR SLEEP Patient not taking: Reported on 04/15/2016 08/07/15   York Spaniel, MD   Allergies  Allergen Reactions  . Dexamethasone Other (See Comments)    During a tapered dose, once.  Was shaky (side effect, not allergy).    Social History  Substance Use Topics  . Smoking status: Never Smoker  . Smokeless tobacco: Never Used  . Alcohol use No    Family History  Problem Relation Age of Onset  . Lung cancer Mother   . Congestive Heart Failure Father   . Depression Sister   . Rheum arthritis Sister   . Headache Maternal Grandfather   . Migraines Maternal Grandfather   . Headache Maternal Uncle   . Diabetes    . Heart disease    . Hypertension    . Breast cancer Neg Hx      Review of Systems  Positive ROS: neg  All other systems have been reviewed and were otherwise negative with the exception of those mentioned in the HPI and as above.  Objective: Vital signs in last 24  hours:    General Appearance: Alert, cooperative, no distress, appears stated age Head: Normocephalic, without obvious abnormality, atraumatic Eyes: PERRL, conjunctiva/corneas clear, EOM's intact    Neck: Supple, symmetrical, trachea midline Back: Symmetric, no curvature, ROM normal, no CVA tenderness Lungs:  respirations unlabored Heart: Regular rate and rhythm Abdomen: Soft, non-tender Extremities: Extremities normal, atraumatic, no cyanosis  or edema Pulses: 2+ and symmetric all extremities Skin: Skin color, texture, turgor normal, no rashes or lesions  NEUROLOGIC:   Mental status: Alert and oriented x4,  no aphasia, good attention span, fund of knowledge, and memory Motor Exam - grossly normal Sensory Exam - grossly normal Reflexes: 1+ Coordination - grossly normal Gait - grossly normal Balance - grossly normal Cranial Nerves: I: smell Not tested  II: visual acuity  OS: nl    OD: nl  II: visual fields Full to confrontation  II: pupils Equal, round, reactive to light  III,VII: ptosis None  III,IV,VI: extraocular muscles  Full ROM  V: mastication Normal  V: facial light touch sensation  Normal  V,VII: corneal reflex  Present  VII: facial muscle function - upper  Normal  VII: facial muscle function - lower Normal  VIII: hearing Not tested  IX: soft palate elevation  Normal  IX,X: gag reflex Present  XI: trapezius strength  5/5  XI: sternocleidomastoid strength 5/5  XI: neck flexion strength  5/5  XII: tongue strength  Normal    Data Review Lab Results  Component Value Date   WBC 5.9 04/18/2016   HGB 13.3 04/18/2016   HCT 40.5 04/18/2016   MCV 89.6 04/18/2016   PLT 186 04/18/2016   Lab Results  Component Value Date   NA 140 04/18/2016   K 3.8 04/18/2016   CL 106 04/18/2016   CO2 24 04/18/2016   BUN 15 04/18/2016   CREATININE 0.78 04/18/2016   GLUCOSE 159 (H) 04/18/2016   Lab Results  Component Value Date   INR 0.99 04/18/2016     Assessment/Plan: Patient admitted for PLIF L4-5. Patient has failed a reasonable attempt at conservative therapy.  I explained the condition and procedure to the patient and answered any questions.  Patient wishes to proceed with procedure as planned. Understands risks/ benefits and typical outcomes of procedure.   Joas Motton S 04/25/2016 7:28 AM

## 2016-04-26 LAB — GLUCOSE, CAPILLARY
GLUCOSE-CAPILLARY: 166 mg/dL — AB (ref 65–99)
GLUCOSE-CAPILLARY: 182 mg/dL — AB (ref 65–99)
Glucose-Capillary: 152 mg/dL — ABNORMAL HIGH (ref 65–99)
Glucose-Capillary: 143 mg/dL — ABNORMAL HIGH (ref 65–99)

## 2016-04-26 NOTE — Evaluation (Signed)
Occupational Therapy Evaluation Patient Details Name: Jessica PutnamGail T Cole MRN: [id number] DOB: 1950/09/29 Today's Date: 04/26/2016    History of Present Illness 65 y.o. 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 She underwent PLIF 04-25-16. Pt underwent PLIF L2-07 December 2015.  Past Medical History:  Diagnosis Date  . Anxiety   . Colon polyps   . Degenerative arthritis   . Depression   . Diabetes mellitus without complication (HCC)    Type II  . Environmental allergies   . Family history of adverse reaction to anesthesia    sister- PONV  . Fatty liver   . Glaucoma   . Headache(784.0)    HX  MIGRAINES  . History of bronchitis   . History of kidney stones   . History of pneumonia   . Hypertension   . Hypothyroidism   . Obesity   . OSA on CPAP   . Plantar fasciitis    right  . PONV (postoperative nausea and vomiting)   . Renal calculi   . Renal calculi   . RLS (restless legs syndrome) 08/23/2014      Clinical Impression   Patient evaluated by Occupational Therapy with no further acute OT needs identified. All education has been completed and the patient has no further questions. See below for any follow-up Occupational Therapy or equipment needs. OT to sign off. Thank you for referral.      Follow Up Recommendations  No OT follow up    Equipment Recommendations  None recommended by OT    Recommendations for Other Services       Precautions / Restrictions Precautions Precautions: Back Precaution Comments: Reviewed 3/3 back precautions. Handout provided. Required Braces or Orthoses: Spinal Brace Spinal Brace: Lumbar corset;Applied in sitting position      Mobility Bed Mobility Overal bed mobility: Needs Assistance Bed Mobility: Rolling;Supine to Sit Rolling: Min guard Sidelying to sit: Min assist       General bed mobility comments: use of rail but correct sequence to exit bed  Transfers Overall transfer level: Needs assistance Equipment used: Rolling walker (2  wheeled) Transfers: Sit to/from Stand Sit to Stand: Min guard         General transfer comment: verbal cues for sequencing    Balance                                            ADL Overall ADL's : Needs assistance/impaired Eating/Feeding: Set up;Sitting   Grooming: Wash/dry hands;Min guard   Upper Body Bathing: Supervision/ safety;Sitting   Lower Body Bathing: Maximal assistance Lower Body Bathing Details (indicate cue type and reason): spouse helps at baseline adn declines AE education Upper Body Dressing : Supervision/safety Upper Body Dressing Details (indicate cue type and reason): don brace      Toilet Transfer: Supervision/safety   Toileting- Clothing Manipulation and Hygiene: Supervision/safety       Functional mobility during ADLs: Supervision/safety General ADL Comments: pt and spouse feel at adequate level for d/c   Back handout provided and reviewed adls in detail. Pt educated on: clothing between brace, never sleep in brace, set an alarm at night for medication, avoid sitting for long periods of time, correct bed positioning for sleeping, correct sequence for bed mobility, avoiding lifting more than 5 pounds and never wash directly over incision. All education is complete and patient indicates understanding.    Vision  Perception     Praxis      Pertinent Vitals/Pain Pain Assessment: Faces Pain Score: 8  Faces Pain Scale: Hurts little more Pain Location: back Pain Descriptors / Indicators: Aching;Sore Pain Intervention(s): Monitored during session;Premedicated before session;Repositioned     Hand Dominance Right   Extremity/Trunk Assessment Upper Extremity Assessment Upper Extremity Assessment: Overall WFL for tasks assessed   Lower Extremity Assessment Lower Extremity Assessment: Defer to PT evaluation   Cervical / Trunk Assessment Cervical / Trunk Assessment: Other exceptions (s/p surg)   Communication  Communication Communication: No difficulties   Cognition Arousal/Alertness: Awake/alert Behavior During Therapy: WFL for tasks assessed/performed Overall Cognitive Status: Within Functional Limits for tasks assessed                     General Comments       Exercises       Shoulder Instructions      Home Living Family/patient expects to be discharged to:: Private residence Living Arrangements: Spouse/significant other Available Help at Discharge: Family;Available 24 hours/day Type of Home: House Home Access: Stairs to enter Entergy CorporationEntrance Stairs-Number of Steps: 1 Entrance Stairs-Rails: Right;Left Home Layout: One level     Bathroom Shower/Tub: Tub/shower unit;Curtain Shower/tub characteristics: Engineer, building servicesCurtain Bathroom Toilet: Standard Bathroom Accessibility: Yes How Accessible: Accessible via walker Home Equipment: Cane - single point;Walker - 2 wheels;Bedside commode          Prior Functioning/Environment Level of Independence: Needs assistance    ADL's / Homemaking Assistance Needed: spouse dresses LB for patient and agrees to complete this admission.             OT Problem List:     OT Treatment/Interventions:      OT Goals(Current goals can be found in the care plan section) Acute Rehab OT Goals Patient Stated Goal: home, independent  OT Frequency:     Barriers to D/C:            Co-evaluation              End of Session Equipment Utilized During Treatment: Gait belt;Rolling walker;Back brace Nurse Communication: Mobility status;Precautions  Activity Tolerance: Patient tolerated treatment well Patient left: in bed;with call bell/phone within reach;with family/visitor present   Time: [id number] OT Time Calculation (min): 21 min Charges:  OT General Charges $OT Visit: 1 Procedure OT Evaluation $OT Eval Moderate Complexity: 1 Procedure G-Codes:    Jessica Cole, Jessica Cole 04/26/2016, 3:11 PM  Jessica Cole, Jessica Cole   OTR/L Pager: [phone] Office:  [phone] .

## 2016-04-26 NOTE — Progress Notes (Signed)
OT Cancellation Note  Patient Details Name: Jessica PutnamGail T Cole MRN: [id number] DOB: 1951-03-04   Cancelled Treatment:    Reason Eval/Treat Not Completed: Patient declined, no reason specified (fatigued ) Sleeping on second attempt after lunch arrival  Felecia ShellingJones, Selestino Nila B   Tommie Bohlken, [name]   OTR/L Pager: [phone] Office: [phone] .  04/26/2016, 1:23 PM

## 2016-04-26 NOTE — Evaluation (Signed)
Physical Therapy Evaluation Patient Details Name: Jessica Cole MRN: [id number] DOB: 1951-03-01 Today's Date: 04/26/2016   History of Present Illness  65 y.o. 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 She underwent PLIF 04-25-16. Pt underwent PLIF L2-07 December 2015.  Clinical Impression  Patient is s/p above surgery resulting in the deficits listed below (see PT Problem List). On eval, pt required min assist bed mobility, min guard assist transfers and min guard assist gait with RW 40. Mobility limited by pain and nausea. Patient will benefit from skilled PT to increase their independence and safety with mobility (while adhering to their precautions) to allow discharge to the venue listed below. Recommend pt return to OPPT when cleared by MD.     Follow Up Recommendations No PT follow up    Equipment Recommendations  None recommended by PT    Recommendations for Other Services       Precautions / Restrictions Precautions Precautions: Back Precaution Comments: Reviewed 3/3 back precautions. Handout provided. Required Braces or Orthoses: Spinal Brace Spinal Brace: Lumbar corset;Applied in sitting position      Mobility  Bed Mobility Overal bed mobility: Needs Assistance Bed Mobility: Rolling;Sidelying to Sit Rolling: Min guard Sidelying to sit: Min assist       General bed mobility comments: +rail, verbal cues for sequencing and precautions  Transfers Overall transfer level: Needs assistance Equipment used: Rolling walker (2 wheeled) Transfers: Sit to/from Stand Sit to Stand: Min guard         General transfer comment: verbal cues for sequencing  Ambulation/Gait Ambulation/Gait assistance: Min guard Ambulation Distance (Feet): 40 Feet Assistive device: Rolling walker (2 wheeled) Gait Pattern/deviations: Step-through pattern;Decreased stride length Gait velocity: decreased Gait velocity interpretation: Below normal speed for age/gender General Gait Details: Knee buckling x  2 but pt able to self correct with only min guard assist  Stairs            Wheelchair Mobility    Modified Rankin (Stroke Patients Only)       Balance                                             Pertinent Vitals/Pain Pain Assessment: 0-10 Pain Score: 8  Pain Location: back Pain Descriptors / Indicators: Aching;Sore Pain Intervention(s): Monitored during session;Repositioned    Home Living Family/patient expects to be discharged to:: Private residence Living Arrangements: Spouse/significant other Available Help at Discharge: Family;Available 24 hours/day Type of Home: House Home Access: Stairs to enter Entrance Stairs-Rails: Doctor, general practiceight;Left Entrance Stairs-Number of Steps: 1 Home Layout: One level Home Equipment: Cane - single point;Walker - 2 wheels;Bedside commode      Prior Function Level of Independence: Independent               Hand Dominance   Dominant Hand: Right    Extremity/Trunk Assessment                Communication   Communication: No difficulties  Cognition Arousal/Alertness: Awake/alert Behavior During Therapy: WFL for tasks assessed/performed Overall Cognitive Status: Within Functional Limits for tasks assessed                      General Comments      Exercises     Assessment/Plan    PT Assessment Patient needs continued PT services  PT Problem List Decreased strength;Decreased activity tolerance;Decreased balance;Decreased mobility;Pain  PT Treatment Interventions DME instruction;Gait training;Stair training;Functional mobility training;Balance training;Therapeutic activities;Patient/family education    PT Goals (Current goals can be found in the Care Plan section)  Acute Rehab PT Goals Patient Stated Goal: home, independent PT Goal Formulation: With patient Time For Goal Achievement: 05/03/16 Potential to Achieve Goals: Good    Frequency Min 5X/week   Barriers to discharge         Co-evaluation               End of Session Equipment Utilized During Treatment: Gait belt;Back brace Activity Tolerance: Treatment limited secondary to medical complications (Comment);Patient limited by pain (nausea) Patient left: in chair;with call bell/phone within reach;with family/visitor present Nurse Communication: Mobility status         Time: [id number] PT Time Calculation (min) (ACUTE ONLY): 18 min   Charges:   PT Evaluation $PT Eval Moderate Complexity: 1 Procedure     PT G Codes:        Ilda FoilGarrow, Channah Godeaux Rene 04/26/2016, 12:07 PM

## 2016-04-26 NOTE — Progress Notes (Signed)
Patient ID: Jessica PutnamGail T Schlachter, female   DOB: [birth year], 65 y.o.   MRN: [id number] Subjective: Patient reports back soreness, no leg pain or NTW  Objective: Vital signs in last 24 hours: Temp:  [97.6 F (36.4 C)-98.8 F (37.1 C)] 98.3 F (36.8 C) (12/23 0523) Pulse Rate:  [57-86] 80 (12/23 0523) Resp:  [12-22] 18 (12/23 0523) BP: (114-173)/(57-111) 125/57 (12/23 0523) SpO2:  [94 %-100 %] 94 % (12/23 0523)  Intake/Output from previous day: 12/22 0701 - 12/23 0700 In: 1450 [I.V.:1200; IV Piggyback:250] Out: 3175 [Urine:3075; Blood:100] Intake/Output this shift: No intake/output data recorded.  Neurologic: Grossly normal  Lab Results: Lab Results  Component Value Date   WBC 5.9 04/18/2016   HGB 13.3 04/18/2016   HCT 40.5 04/18/2016   MCV 89.6 04/18/2016   PLT 186 04/18/2016   Lab Results  Component Value Date   INR 0.99 04/18/2016   BMET Lab Results  Component Value Date   NA 140 04/18/2016   K 3.8 04/18/2016   CL 106 04/18/2016   CO2 24 04/18/2016   GLUCOSE 159 (H) 04/18/2016   BUN 15 04/18/2016   CREATININE 0.78 04/18/2016   CALCIUM 9.9 04/18/2016    Studies/Results: Dg Lumbar Spine 2-3 Views  Result Date: 04/25/2016 CLINICAL DATA:  Lumbar spine surgery.  Adjacent segment disease. EXAM: DG C-ARM 61-120 MIN; LUMBAR SPINE - 2-3 VIEW COMPARISON:  None. FINDINGS: Intraoperative spine radiographs demonstrate extension of L2 through L4 instrumented fusion caudally to involve the L4-5 interspace. Satisfactory position and alignment of the L4-5 cage, L5 pedicle screws and rods. IMPRESSION: As above. Electronically Signed   By: Elsie StainJohn T Curnes M.D.   On: 04/25/2016 13:56   Dg C-arm 1-60 Min  Result Date: 04/25/2016 CLINICAL DATA:  Lumbar spine surgery.  Adjacent segment disease. EXAM: DG C-ARM 61-120 MIN; LUMBAR SPINE - 2-3 VIEW COMPARISON:  None. FINDINGS: Intraoperative spine radiographs demonstrate extension of L2 through L4 instrumented fusion caudally to involve the  L4-5 interspace. Satisfactory position and alignment of the L4-5 cage, L5 pedicle screws and rods. IMPRESSION: As above. Electronically Signed   By: Elsie StainJohn T Curnes M.D.   On: 04/25/2016 13:56    Assessment/Plan: Pain control, mobilize   LOS: 1 day    Brantleigh Mifflin S 04/26/2016, 9:08 AM

## 2016-04-27 LAB — GLUCOSE, CAPILLARY: GLUCOSE-CAPILLARY: 135 mg/dL — AB (ref 65–99)

## 2016-04-27 MED ORDER — METHOCARBAMOL 500 MG PO TABS: 500.0000 mg | ORAL_TABLET | Freq: Four times a day (QID) | ORAL | 0 refills | Status: DC | PRN

## 2016-04-27 MED ORDER — OXYCODONE HCL 10 MG PO TABS: 10.0000 mg | ORAL_TABLET | ORAL | 0 refills | Status: DC | PRN

## 2016-04-27 NOTE — Discharge Summary (Signed)
Physician Discharge Summary  Patient ID: GRACEN RINGWALD MRN: [id number] DOB/AGE: 1951-02-26 65 y.o.  Admit date: 04/25/2016 Discharge date: 04/27/2016  Admission Diagnoses: adjacent level HNP    Discharge Diagnoses: same   Discharged Condition: good  Hospital Course: The patient was admitted on 04/25/2016 and taken to the operating room where the patient underwent PLIF L4-5. The patient tolerated the procedure well and was taken to the recovery room and then to the floor in stable condition. The hospital course was routine. There were no complications. The wound remained clean dry and intact. Pt had appropriate bac soreness. No complaints of leg pain or new N/T/W. The patient remained afebrile with stable vital signs, and tolerated a regular diet. The patient continued to increase activities, and pain was well controlled with oral pain medications.   Consults: None  Significant Diagnostic Studies:  Results for orders placed or performed during the hospital encounter of 04/25/16  Glucose, capillary  Result Value Ref Range   Glucose-Capillary 131 (H) 65 - 99 mg/dL  Glucose, capillary  Result Value Ref Range   Glucose-Capillary 135 (H) 65 - 99 mg/dL  Glucose, capillary  Result Value Ref Range   Glucose-Capillary 236 (H) 65 - 99 mg/dL  Glucose, capillary  Result Value Ref Range   Glucose-Capillary 166 (H) 65 - 99 mg/dL   Comment 1 Notify RN    Comment 2 Document in Chart   Glucose, capillary  Result Value Ref Range   Glucose-Capillary 182 (H) 65 - 99 mg/dL   Comment 1 Notify RN    Comment 2 Document in Chart   Glucose, capillary  Result Value Ref Range   Glucose-Capillary 152 (H) 65 - 99 mg/dL   Comment 1 Notify RN    Comment 2 Document in Chart   Glucose, capillary  Result Value Ref Range   Glucose-Capillary 143 (H) 65 - 99 mg/dL   Comment 1 Notify RN    Comment 2 Document in Chart   Glucose, capillary  Result Value Ref Range   Glucose-Capillary 135 (H) 65 - 99 mg/dL    Comment 1 Notify RN    Comment 2 Document in Chart     Dg Lumbar Spine 2-3 Views  Result Date: 04/25/2016 CLINICAL DATA:  Lumbar spine surgery.  Adjacent segment disease. EXAM: DG C-ARM 61-120 MIN; LUMBAR SPINE - 2-3 VIEW COMPARISON:  None. FINDINGS: Intraoperative spine radiographs demonstrate extension of L2 through L4 instrumented fusion caudally to involve the L4-5 interspace. Satisfactory position and alignment of the L4-5 cage, L5 pedicle screws and rods. IMPRESSION: As above. Electronically Signed   By: Elsie Stain M.D.   On: 04/25/2016 13:56   US Transvaginal Non-ob  Result Date: 04/17/2016 CLINICAL DATA:  Cyst on CT EXAM: TRANSABDOMINAL AND TRANSVAGINAL ULTRASOUND OF PELVIS TECHNIQUE: Both transabdominal and transvaginal ultrasound examinations of the pelvis were performed. Transabdominal technique was performed for global imaging of the pelvis including uterus, ovaries, adnexal regions, and pelvic cul-de-sac. It was necessary to proceed with endovaginal exam following the transabdominal exam to visualize the adnexal structures. COMPARISON:  CT 04/11/2016 FINDINGS: Uterus Surgically absent Endometrium Not visualized Right ovary Not visualized Left ovary Not visualized. Anechoic left adnexal cyst is visualized measuring 4.3 x 2.9 x 3.8 cm. No significant septations or mural nodule. Other findings No abnormal free fluid. IMPRESSION: 1. Patient is status post hysterectomy 2. The ovaries are nonvisualized 3. 4.3 cm left adnexal cyst. This is almost certainly benign, but follow up ultrasound is recommended in 1 year according  to the Society of Radiologists in Ultrasound2010 Consensus Conference Statement (D Lavonda JumboLevine et al. Management of Asymptomatic Ovarian and Other Adnexal Cysts Imaged at US: Society of Radiologists in Ultrasound Consensus Conference Statement 2010. Radiology 256 (Sept 2010): 943-954.). Electronically Signed   By: Jasmine PangKim  Fujinaga M.D.   On: 04/17/2016 15:30   [place] Pelvis  Complete  Result Date: 04/17/2016 CLINICAL DATA:  Cyst on CT EXAM: TRANSABDOMINAL AND TRANSVAGINAL ULTRASOUND OF PELVIS TECHNIQUE: Both transabdominal and transvaginal ultrasound examinations of the pelvis were performed. Transabdominal technique was performed for global imaging of the pelvis including uterus, ovaries, adnexal regions, and pelvic cul-de-sac. It was necessary to proceed with endovaginal exam following the transabdominal exam to visualize the adnexal structures. COMPARISON:  CT 04/11/2016 FINDINGS: Uterus Surgically absent Endometrium Not visualized Right ovary Not visualized Left ovary Not visualized. Anechoic left adnexal cyst is visualized measuring 4.3 x 2.9 x 3.8 cm. No significant septations or mural nodule. Other findings No abnormal free fluid. IMPRESSION: 1. Patient is status post hysterectomy 2. The ovaries are nonvisualized 3. 4.3 cm left adnexal cyst. This is almost certainly benign, but follow up ultrasound is recommended in 1 year according to the Society of Radiologists in Ultrasound2010 Consensus Conference Statement (D Lenis NoonLevine et al. Management of Asymptomatic Ovarian and Other Adnexal Cysts Imaged at US: Society of Radiologists in Ultrasound Consensus Conference Statement 2010. Radiology 256 (Sept 2010): 943-954.). Electronically Signed   By: Jasmine PangKim  Fujinaga M.D.   On: 04/17/2016 15:30   Dg C-arm 1-60 Min  Result Date: 04/25/2016 CLINICAL DATA:  Lumbar spine surgery.  Adjacent segment disease. EXAM: DG C-ARM 61-120 MIN; LUMBAR SPINE - 2-3 VIEW COMPARISON:  None. FINDINGS: Intraoperative spine radiographs demonstrate extension of L2 through L4 instrumented fusion caudally to involve the L4-5 interspace. Satisfactory position and alignment of the L4-5 cage, L5 pedicle screws and rods. IMPRESSION: As above. Electronically Signed   By: Elsie StainJohn T Curnes M.D.   On: 04/25/2016 13:56   Ct Renal Stone Study  Result Date: 04/11/2016 CLINICAL DATA:  Right flank pain and hematuria for 2  days. History of urinary tract stones. EXAM: CT ABDOMEN AND PELVIS WITHOUT CONTRAST TECHNIQUE: Multidetector CT imaging of the abdomen and pelvis was performed following the standard protocol without IV contrast. COMPARISON:  CT abdomen and pelvis 10/13/2012. FINDINGS: Lower chest: Mild dependent atelectasis in the lung bases is noted. Heart size is upper normal. No pleural or pericardial effusion. Hepatobiliary: There is some nodularity of the liver border best seen along the left hepatic lobe. No focal lesion is identified. The gallbladder and biliary tree appear normal. Pancreas: Negative. Spleen: Negative. Adrenals/Urinary Tract: Moderate right hydronephrosis and dilatation of the right ureter are identified due to a 0.3 cm stone at the right UVJ. A punctate nonobstructing stone is identified in the lower pole of the left kidney. There is no left hydronephrosis or left ureteral stone. The adrenal glands appear normal. Stomach/Bowel: Diverticulosis is seen with stranding about the colon the junction of the descending and sigmoid. No abscess or perforation. The colon is otherwise unremarkable. The appendix has been removed. The stomach and small bowel appear normal. Vascular/Lymphatic: Aortoiliac atherosclerosis without aneurysm is identified. No lymphadenopathy. Reproductive: The patient is status post hysterectomy. A cystic left ovarian lesion measures 3.1 cm AP x 3.0 cm transverse compared to 2.3 cm AP x 2.3 cm transverse on the prior examination. The lesion is 3.7 cm craniocaudal. The right ovary is unremarkable. Other: No hernia or fluid collection. Musculoskeletal: No acute abnormality. The patient  is status post L2-4 fusion. IMPRESSION: Moderate right hydronephrosis due to a 0.3 cm stone at the right UVJ. Punctate nonobstructing stone lower pole left kidney noted. Extensive diverticular disease with stranding about the colon at the junction of the descending and sigmoid worrisome for diverticulitis. No  abscess or perforation. **An incidental finding of potential clinical significance has been found. Cystic lesion in the left ovary is slightly enlarged since since the prior CT scan. Nonemergent ultrasound is recommended for further evaluation. This recommendation follows ACR consensus guidelines: White Paper of the ACR Incidental Findings Committee II on Adnexal Findings. J Am Coll Radiol [id number]:10:675-681.** Mildly nodular appearance of the left liver border suggestive of cirrhosis. The appearance is unchanged. Atherosclerosis. Electronically Signed   By: Drusilla Kannerhomas  Dalessio M.D.   On: 04/11/2016 12:51    Antibiotics:  Anti-infectives    Start     Dose/Rate Route Frequency Ordered Stop   04/25/16 1845  ceFAZolin (ANCEF) IVPB 1 g/50 mL premix     1 g 100 mL/hr over 30 Minutes Intravenous Every 8 hours 04/25/16 1833 04/26/16 0330   04/25/16 1144  bacitracin 50,000 Units in sodium chloride irrigation 0.9 % 500 mL irrigation  Status:  Discontinued       As needed 04/25/16 1144 04/25/16 1415   04/25/16 1144  vancomycin (VANCOCIN) powder  Status:  Discontinued       As needed 04/25/16 1144 04/25/16 1415   04/25/16 0808  ceFAZolin (ANCEF) 2-4 GM/100ML-% IVPB    Comments:  Ray ChurchBowman, Jennifer   : cabinet override      04/25/16 0808 04/25/16 1107   04/25/16 0804  ceFAZolin (ANCEF) IVPB 2g/100 mL premix     2 g 200 mL/hr over 30 Minutes Intravenous On call to O.R. 04/25/16 0804 04/25/16 1107      Discharge Exam: Blood pressure 138/90, pulse 79, temperature 98.3 F (36.8 C), temperature source Oral, resp. rate 18, SpO2 96 %. Neurologic: Grossly normal Dressing dry  Discharge Medications:   Allergies as of 04/27/2016      Reactions   Dexamethasone Other (See Comments)   During a tapered dose, once.  Was shaky (side effect, not allergy).      Medication List    TAKE these medications   ALLER-EASE 180 MG tablet Generic drug:  fexofenadine Take 180 mg by mouth daily.   azelastine 0.1 % nasal  spray Commonly known as:  ASTELIN Place 1 spray into both nostrils 2 (two) times daily.   Co Q 10 100 MG Caps Take 200 mg by mouth daily.   diclofenac sodium 1 % Gel Commonly known as:  VOLTAREN Apply 4 g topically 2 (two) times daily. What changed:  when to take this  reasons to take this   DULoxetine 30 MG capsule Commonly known as:  CYMBALTA Take 90 mg by mouth daily.   estradiol 0.05 MG/24HR patch Commonly known as:  VIVELLE-DOT Place 1 patch onto the skin 2 (two) times a week.   FISH OIL ULTRA 1000 MG Caps Take 1,000 mg by mouth 2 (two) times daily.   fluticasone 50 MCG/ACT nasal spray Commonly known as:  FLONASE Place 2 sprays into both nostrils 2 (two) times daily.   gabapentin 800 MG tablet Commonly known as:  NEURONTIN Take 1 tablet (800 mg total) by mouth 2 (two) times daily.   I-COOL FOR MENOPAUSE 30 MG Tabs Generic drug:  Genistein Take 30 mg by mouth every morning.   latanoprost 0.005 % ophthalmic solution Commonly known as:  XALATAN Place 1 drop into both eyes at bedtime.   levothyroxine 112 MCG tablet Commonly known as:  SYNTHROID, LEVOTHROID Take 112 mcg by mouth daily before breakfast.   losartan 50 MG tablet Commonly known as:  COZAAR Take 50 mg by mouth every evening.   metFORMIN 500 MG tablet Commonly known as:  GLUCOPHAGE Take 500 mg by mouth 2 (two) times daily with a meal.   methocarbamol 500 MG tablet Commonly known as:  ROBAXIN Take 1 tablet (500 mg total) by mouth every 6 (six) hours as needed for muscle spasms.   metoprolol succinate 25 MG 24 hr tablet Commonly known as:  TOPROL-XL TAKE ONE (1) TABLET BY MOUTH EVERY DAY What changed:  See the new instructions.   naproxen sodium 220 MG tablet Commonly known as:  ANAPROX Take 440 mg by mouth daily as needed (pain).   Oxycodone HCl 10 MG Tabs Take 1 tablet (10 mg total) by mouth every 4 (four) hours as needed (for pain). What changed:  how much to take  when to take  this  additional instructions   Red Yeast Rice 600 MG Caps Take 1,200 mg by mouth 2 (two) times daily.   rOPINIRole 4 MG tablet Commonly known as:  REQUIP Take 1 tablet (4 mg total) by mouth 2 (two) times daily.   sulindac 200 MG tablet Commonly known as:  CLINORIL Take 200 mg by mouth 2 (two) times daily.   traZODone 100 MG tablet Commonly known as:  DESYREL TAKE ONE TO TWO TABLETS BY MOUTH AT BEDTIME FOR SLEEP   VITAMIN B COMPLEX PO Take 1 tablet by mouth daily.            Durable Medical Equipment        Start     Ordered   04/25/16 1834  DME Walker rolling  Once    Question:  Patient needs a walker to treat with the following condition  Answer:  S/P lumbar spinal fusion   04/25/16 1833      Disposition: home   Final Dx: PLIF L4-5  Discharge Instructions     Remove dressing in 72 hours    Complete by:  As directed    Call MD for:  difficulty breathing, headache or visual disturbances    Complete by:  As directed    Call MD for:  persistant nausea and vomiting    Complete by:  As directed    Call MD for:  redness, tenderness, or signs of infection (pain, swelling, redness, odor or green/yellow discharge around incision site)    Complete by:  As directed    Call MD for:  severe uncontrolled pain    Complete by:  As directed    Call MD for:  temperature >100.4    Complete by:  As directed    Diet - low sodium heart healthy    Complete by:  As directed    Discharge instructions    Complete by:  As directed    No driving, no heavy lifting, may shower, no bending   Increase activity slowly    Complete by:  As directed          Signed: Cordale Manera S 04/27/2016, 6:46 AM

## 2016-04-27 NOTE — Progress Notes (Signed)
Discharge orders received.  Discharge instructions and follow-up appointments reviewed with the patient.  VSS upon discharge.  IV removed and education complete.  Transported out via wheelchair.   Mirayah Wren M, RN 

## 2016-04-27 NOTE — Progress Notes (Signed)
Physical Therapy Treatment Patient Details Name: Jessica Cole MRN: [id number] DOB: Sep 20, 1950 Today's Date: 04/27/2016    History of Present Illness 65 y.o. 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 She underwent PLIF 04-25-16. Pt underwent PLIF L2-07 December 2015.    PT Comments    Pt progressing well.  Performed gait training without RW and reviewed stair training in prep for d/c home.  PTA re-educated patient and spouse on spinal precautions and continued walking at d/c.  Pt is ready to d/c from a mobility standpoint.   Follow Up Recommendations  No PT follow up     Equipment Recommendations  None recommended by PT    Recommendations for Other Services       Precautions / Restrictions Precautions Precautions: Back Precaution Comments: Reviewed 3/3 back precautions. Handout provided. Required Braces or Orthoses: Spinal Brace Spinal Brace: Lumbar corset;Applied in sitting position Restrictions Weight Bearing Restrictions: No    Mobility  Bed Mobility               General bed mobility comments: Pt standing in room on arrival without brace.  Educated patient to have brace in place when sitting or OOB.    Transfers Overall transfer level: Needs assistance Equipment used: None Transfers: Sit to/from Stand Sit to Stand: Modified independent (Device/Increase time)         General transfer comment: Good technique to and from seated surface.    Ambulation/Gait Ambulation/Gait assistance: Supervision Ambulation Distance (Feet): 125 Feet Assistive device: None Gait Pattern/deviations: Step-through pattern;Decreased stride length Gait velocity: decreased   General Gait Details: No buckling observed with good posture and stride.  Cues to avoid twisting when turning.     Stairs Stairs: Yes   Stair Management: One rail Right Number of Stairs: 1 General stair comments: Cues for sequencing to negotiate stair.    Wheelchair Mobility    Modified Rankin (Stroke Patients  Only)       Balance                                    Cognition Arousal/Alertness: Awake/alert Behavior During Therapy: WFL for tasks assessed/performed Overall Cognitive Status: Within Functional Limits for tasks assessed                      Exercises      General Comments        Pertinent Vitals/Pain Pain Assessment: 0-10 Pain Score: 1  Pain Location: back Pain Descriptors / Indicators: Aching;Sore Pain Intervention(s): Monitored during session;Relaxation;Repositioned    Home Living Family/patient expects to be discharged to:: Private residence Living Arrangements: Spouse/significant other                  Prior Function            PT Goals (current goals can now be found in the care plan section) Acute Rehab PT Goals Patient Stated Goal: home, independent Potential to Achieve Goals: Good Progress towards PT goals: Progressing toward goals    Frequency    Min 5X/week      PT Plan Current plan remains appropriate    Co-evaluation             End of Session Equipment Utilized During Treatment: Back brace Activity Tolerance: Patient tolerated treatment well Patient left: in chair;with call bell/phone within reach;with family/visitor present     Time: [id number] PT Time Calculation (min) (ACUTE ONLY):  14 min  Charges:  $Gait Training: 8-22 mins                    G Codes:      Florestine Aversimee J Mataeo Ingwersen 04/27/2016, 9:58 AM  Joycelyn RuaAimee Zenita Kister, PTA pager [phone]

## 2016-04-29 ENCOUNTER — Encounter (HOSPITAL_COMMUNITY): Payer: Self-pay | Admitting: Neurological Surgery

## 2016-05-30 DIAGNOSIS — N83202 Unspecified ovarian cyst, left side: Secondary | ICD-10-CM | POA: Insufficient documentation

## 2016-06-18 ENCOUNTER — Telehealth: Payer: Self-pay | Admitting: Neurology

## 2016-06-18 MED ORDER — DIAZEPAM 5 MG PO TABS: 5.0000 mg | ORAL_TABLET | Freq: Every day | ORAL | 3 refills | Status: DC

## 2016-06-18 NOTE — Telephone Encounter (Signed)
I called the patient. The patient is on Requip 4 mg twice daily and 800 mg of gabapentin twice daily without benefit with her restless legs. Restless legs begins in the late afternoon and early evening. She is not sleeping well at night because of the sensations. The patient will be given a trial of diazepam 5 mg at night. If this is not effective, we may consider IV iron therapy.

## 2016-06-18 NOTE — Telephone Encounter (Signed)
Dr Willis- please advise 

## 2016-06-18 NOTE — Telephone Encounter (Signed)
Patient is calling in reference to leg pain.  Patient states the medication she is taking has not worked and she had to take a pain medication she had left over from surgery late last year.  Please call

## 2016-06-18 NOTE — Telephone Encounter (Signed)
Faxed printed/signed rx  diazepam by CW,MD to pt pharmacy. Fax: [phone]. Received confirmation.

## 2016-06-18 NOTE — Addendum Note (Signed)
Addended by: Stephanie AcreWILLIS, Zayden Hahne on: 06/18/2016 03:46 PM   Modules accepted: Orders

## 2016-06-23 ENCOUNTER — Other Ambulatory Visit: Payer: Self-pay | Admitting: Neurology

## 2016-06-25 ENCOUNTER — Ambulatory Visit: Payer: Medicare HMO | Attending: Neurological Surgery | Admitting: Physical Therapy

## 2016-06-25 ENCOUNTER — Encounter: Payer: Self-pay | Admitting: Physical Therapy

## 2016-06-25 DIAGNOSIS — M545 Low back pain, unspecified: Secondary | ICD-10-CM

## 2016-06-25 DIAGNOSIS — R262 Difficulty in walking, not elsewhere classified: Secondary | ICD-10-CM | POA: Insufficient documentation

## 2016-06-25 DIAGNOSIS — M6281 Muscle weakness (generalized): Secondary | ICD-10-CM | POA: Insufficient documentation

## 2016-06-26 NOTE — Therapy (Signed)
[place] [facility] [address], [place], [address] Phone: [phone]   Fax:  [phone]  Physical Therapy Evaluation  Patient Details  Name: Jessica Cole MRN: [id number] Date of Birth: June 10, 1950 Referring Provider: Tia Alert MD  Encounter Date: 06/25/2016      PT End of Session - 06/25/16 0926    Visit Number 1   Number of Visits 12   Date for PT Re-Evaluation 08/06/16   Authorization Type 1   Authorization Time Period 10 G code   PT Start Time 0901   PT Stop Time 1010   PT Time Calculation (min) 69 min   Activity Tolerance Patient tolerated treatment well;Patient limited by pain   Behavior During Therapy [facility] for tasks assessed/performed      Past Medical History:  Diagnosis Date  . Anxiety   . Colon polyps   . Degenerative arthritis   . Depression   . Diabetes mellitus without complication (HCC)    Type II  . Environmental allergies   . Family history of adverse reaction to anesthesia    sister- PONV  . Fatty liver   . Glaucoma   . Headache(784.0)    HX  MIGRAINES  . History of bronchitis   . History of kidney stones   . History of pneumonia   . Hypertension   . Hypothyroidism   . Obesity   . OSA on CPAP   . Plantar fasciitis    right  . PONV (postoperative nausea and vomiting)   . Renal calculi   . Renal calculi   . RLS (restless legs syndrome) 08/23/2014    Past Surgical History:  Procedure Laterality Date  . ABDOMINAL HYSTERECTOMY     partial  . APPENDECTOMY    . Arthroscopic surgery, knee Left   . BREAST BIOPSY Right    several  . BUNIONECTOMY     LEFT   . COLONOSCOPY W/ POLYPECTOMY    . FOOT SURGERY     RIGHT    . KNEE ARTHROSCOPY W/ MENISCAL REPAIR Left   . LASIK    . MAXIMUM ACCESS (MAS)POSTERIOR LUMBAR INTERBODY FUSION (PLIF) 1 LEVEL N/A 04/25/2016   Procedure: LUMBAR FOUR-FIVE  MAXIMUM ACCESS (MAS) POSTERIOR LUMBAR INTERBODY FUSION (PLIF) with extension of  instrumentation LUMBAR TWO-FIVE;  Surgeon: Tia Alert, MD;  [facility];  Service: Neurosurgery;  Laterality: N/A;  . MAXIMUM ACCESS (MAS)POSTERIOR LUMBAR INTERBODY FUSION (PLIF) 2 LEVEL N/A 12/13/2015   Procedure: Lumbar two-three - Lumbar three-four MAXIMUM ACCESS (MAS) POSTERIOR LUMBAR INTERBODY FUSION (PLIF)  ;  Surgeon: Tia Alert, MD;  Location: [facility] NEURO ORS;  Service: Neurosurgery;  Laterality: N/A;  . SHOULDER SURGERY     RIGHT   . TONSILLECTOMY      There were no vitals filed for this visit.       Subjective Assessment - 06/25/16 0929    Subjective Patient reports she is doing well s/p surgery for back 04/25/2016. She is now cleared for being able to do whatever she can that doesn't hurt. she currently reports her back and legs feel weak.    Pertinent History Patient reports progressive worsening back and leg pain over the past year and a half. She had surgery 12/13/2015 for fusion L2-3, L3-4 and then increased pain with radiculopathy in left LE with subsequent surgery PLIF L4-5 04/25/2016. Since surgery she has been gradually increasing activity.    Limitations Sitting;Standing;Walking;House hold activities   How  long can you sit comfortably? 30 min.   How long can you stand comfortably? 10 min.   How long can you walk comfortably? 30 min.   Patient Stated Goals to return to full activity without difficulty   Currently in Pain? No/denies            [facility] PT Assessment - 06/26/16 0001      Assessment   Medical Diagnosis Lumbar radiculopathy: surgery PLIF 1 level 04/25/2016   Referring Provider Tia Alert MD   Onset Date/Surgical Date 04/25/16   Hand Dominance Right   Next MD Visit unknown   Prior Therapy yes, 09/2014 for leg pain, back and leg pain 05/2015, s/p PLIF 12/2015, none since surgery     Precautions   Precautions Back;None   Precaution Booklet Issued    Precaution Comments    Required Braces or Orthoses      Restrictions   Weight Bearing  Restrictions No     Balance Screen   Has the patient fallen in the past 6 months No   Has the patient had a decrease in activity level because of a fear of falling?  No   Is the patient reluctant to leave their home because of a fear of falling?  No     Home [employer] information centre manager residence   Living Arrangements Spouse/significant other   Home Access Stairs to enter   Entrance Stairs-Number of Steps 6  back entrance   Entrance Stairs-Rails --  both   Home Layout One level     Prior Function   Level of Independence Independent   Vocation Retired   Leisure likes to bake, sew, read, gardening     Cognition   Overall Cognitive Status Within Functional Limits for tasks assessed      Objective: Observation:  Gait: mild antalgic gait pattern, short step length bilaterally, decreased trunk rotation Palpation; lumbar spine along incision point tender with spasms palpable AROM; Lumbar spine NT due to pain; (2) recent surgeries LE's hip flexion, knee flexion/extension WFL bilateral Sensation; grossly intact to light touch throughout both LE's Reflexes: right knee patella tendon hyper reflexive, left knee 1+ Strength:  Right LE: hip flexion 4/5, knee extension 4/5, knee flexion 4/5, ankle DF 4/5, hip abduction/ER 4-/5 Left LE: hip flexion 4-/5, knee extension 4-/5, knee flexion 4/5, ankle DF 4/5, hip abduction/ER 4-/5 Outcome measures: Modified oswestry: 34% (moderate self perceived disability)  LEFS 29/80 (80 = no self perceived disability) 6 min. Walk to be assessed next session  Treatment: Instructed in proper posture, positioning for sitting, transfers, body mechanics for daily activities to decreased strain on lower back Therapeutic exercises: patient performed exercises with guidance, verbal and tactile cues and demonstration of PT: Sitting: Hip adduction with ball and glute sets x 10 reps Hip abduction with resistive band x 15 reps Ankle pumps Quad  setting  Modalities: Electrical stimulation: high volt for muscle spasms: (2) electrodes applied to lower back lumbar paravertebral muscles with patient in sitting with back supported and LE's supported x 15 min.  Patient response to treatment: Patient demonstrated good technique with exercises following demonstration and with VC. She continued with stiffness and difficulty with sit to stand following end of session. No adverse reaction to estim. noted.         PT Education - 06/25/16 1010    Education provided Yes   Education Details HEP: stabilization in sitting with ball, hip adduction with glute sets, hip abduction with resistive  band, proper posture for sitting, standing   Person(s) Educated Patient   Methods Explanation;Demonstration;Tactile cues;Verbal cues;Handout   Comprehension Verbalized understanding;Returned demonstration;Verbal cues required;Need further instruction             PT Long Term Goals - 25-Jul-2016 1012      PT LONG TERM GOAL #1   Title patient will demonstrate improved function with decreased back pain with MODI score of 20% or less by 08/06/2016   Baseline MODI 34% (moderate self perceived disability)   Status New     PT LONG TERM GOAL #2   Title Patient  will demonstrate good knowledge of posture and body mechanics for back and be able to stand and walk > 1200' by 08/06/2016 demonstrating improved endurance for community activities   Baseline unable to walk even short distances without increased back and left LE pain/spasms   Status New     PT LONG TERM GOAL #3   Title Improve  LEFS score to 40/80 or better  indicating improved function with daily tasks, improved LE strength by 08/06/2016   Baseline LEFS 29/80   Status New     PT LONG TERM GOAL #4   Title Patient will be independent with home exercises without cuing for core control, strengthening to allow self management once discharged from physical therapy 08/06/16   Baseline limited knowledge and  maximal VC required for guided exercises and progression    Status New               Plan - 07/25/16 1000    Clinical Impression Statement Patient is a 66 year old female who presents s/p PLIF 1 level 04/25/2016 and previous PLIF 2 levels 12/2015.  She has limitations with dressing, walking, household chores, community ambulation due to back pain, weakness in LE's. She has limited knowledge  of appropriate exercises and progression in order to return to prior level of function. Her modified oswestry score is 34% and LEFS 29/80 indicating moderate self perceived disability with functional activities and  community activities. She has limited knowledge of appropriate pain control strategies and progression of exercises and will benefit from physical therapy intervention to achieve 20% or less impairment.   Rehab Potential Good   Clinical Impairments Affecting Rehab Potential (+) motivated (-) chronic condition of pain in hip/low back; multiple surgeries on spine within 6 months, multiple co morbidities: arthritis, depression, diabetes, obesity, HTN   PT Frequency 2x / week   PT Duration 6 weeks   PT Treatment/Interventions Manual techniques;Electrical Stimulation;Moist Heat;Patient/family education;Cryotherapy;Therapeutic exercise;Dry needling;Ultrasound   PT Next Visit Plan pain control, progress exercises as tolerated   PT Home Exercise Plan home program for stabilization      Patient will benefit from skilled therapeutic intervention in order to improve the following deficits and impairments:  Pain, Impaired flexibility, Difficulty walking, Increased muscle spasms, Decreased strength, Impaired perceived functional ability, Decreased activity tolerance, Decreased range of motion, Decreased endurance  Visit Diagnosis: Muscle weakness (generalized) - Plan: PT plan of care cert/re-cert  Bilateral low back pain without sciatica, unspecified chronicity - Plan: PT plan of care  cert/re-cert  Difficulty in walking, not elsewhere classified - Plan: PT plan of care cert/re-cert      G-Codes - 2016/07/25 1015    Functional Assessment Tool Used (Outpatient Only) pain scale, clinical judgment, modified oswestry, strength deficits, ROM, LEFS   Functional Limitation Mobility: Walking and moving around   Mobility: Walking and Moving Around Current Status ([id number]) At least 40 percent but  less than 60 percent impaired, limited or restricted   Mobility: Walking and Moving Around Goal Status [phone]([id number]) At least 20 percent but less than 40 percent impaired, limited or restricted       Problem List Patient Active Problem List   Diagnosis Date Noted  . S/P lumbar spinal fusion 12/13/2015  . Controlled type 2 diabetes mellitus without complication (HCC) 05/16/2015  . Thrombocytopenia (HCC) 05/16/2015  . Recurrent major depressive disorder, in full remission (HCC) 05/16/2015  . Essential (primary) hypertension 05/16/2015  . Fatty infiltration of liver 03/15/2015  . Aortic valve stenosis, nonrheumatic 01/18/2015  . Sciatica of right side 01/09/2015  . Type 2 diabetes mellitus (HCC) 11/10/2014  . RLS (restless legs syndrome) 08/23/2014  . Neuritis or radiculitis due to rupture of lumbar intervertebral disc 06/13/2014  . Degeneration of intervertebral disc of lumbar region 06/13/2014  . Arthritis 01/23/2014  . Arthritis of knee, degenerative 11/15/2013  . Depression 09/29/2013  . Insomnia 09/29/2013  . Apnea, sleep 08/29/2013  . Calculus of kidney 08/29/2013  . Abnormal presence of protein in urine 08/29/2013  . Headache, migraine 08/29/2013  . BP (high blood pressure) 08/29/2013  . HLD (hyperlipidemia) 08/29/2013  . Allergic rhinitis 08/29/2013  . Intractable migraine without aura 02/18/2013    Beacher MayBrooks, Annya Lizana PT 06/26/2016, 3:51 PM  [facility] [address] [place], [place], [address] Phone: [phone]    Fax:  [phone]  Name: Jessica Cole MRN: [id number] Date of Birth: 1950-12-25

## 2016-07-02 ENCOUNTER — Ambulatory Visit: Payer: Medicare HMO | Admitting: Physical Therapy

## 2016-07-08 ENCOUNTER — Encounter: Payer: Medicare HMO | Admitting: Physical Therapy

## 2016-07-09 ENCOUNTER — Ambulatory Visit: Payer: Medicare HMO | Attending: Neurological Surgery | Admitting: Physical Therapy

## 2016-07-09 DIAGNOSIS — R262 Difficulty in walking, not elsewhere classified: Secondary | ICD-10-CM | POA: Diagnosis present

## 2016-07-09 DIAGNOSIS — M545 Low back pain, unspecified: Secondary | ICD-10-CM

## 2016-07-09 DIAGNOSIS — M6281 Muscle weakness (generalized): Secondary | ICD-10-CM | POA: Insufficient documentation

## 2016-07-10 NOTE — Therapy (Signed)
[facility] [address] [place], [place], [address] Phone: [phone]   Fax:  [phone]  Physical Therapy Treatment  Patient Details  Name: Jessica Cole MRN: [id number] Date of Birth: Mar 07, 1951 Referring Provider: Tia AlertJones, David S MD  Encounter Date: 07/09/2016      PT End of Session - 07/09/16 0935    Visit Number 2   Number of Visits 12   Date for PT Re-Evaluation 08/06/16   Authorization Type 2   Authorization Time Period 10 G code   PT Start Time [phone] [id number]   PT Stop Time 0930   PT Time Calculation (min) 41 min   Activity Tolerance Patient tolerated treatment well;Patient limited by pain   Behavior [facility] Therapy [facility] for tasks assessed/performed      Past Medical History:  Diagnosis Date  . Anxiety   . Colon polyps   . Degenerative arthritis   . Depression   . Diabetes mellitus without complication (HCC)    Type II  . Environmental allergies   . Family history of adverse reaction to anesthesia    sister- PONV  . Fatty liver   . Glaucoma   . Headache(784.0)    HX  MIGRAINES  . History of bronchitis   . History of kidney stones   . History of pneumonia   . Hypertension   . Hypothyroidism   . Obesity   . OSA on CPAP   . Plantar fasciitis    right  . PONV (postoperative nausea and vomiting)   . Renal calculi   . Renal calculi   . RLS (restless legs syndrome) 08/23/2014    Past Surgical History:  Procedure Laterality Date  . ABDOMINAL HYSTERECTOMY     partial  . APPENDECTOMY    . Arthroscopic surgery, knee Left   . BREAST BIOPSY Right    several  . BUNIONECTOMY     LEFT   . COLONOSCOPY W/ POLYPECTOMY    . FOOT SURGERY     RIGHT    . KNEE ARTHROSCOPY W/ MENISCAL REPAIR Left   . LASIK    . MAXIMUM ACCESS (MAS)POSTERIOR LUMBAR INTERBODY FUSION (PLIF) 1 LEVEL N/A 04/25/2016   Procedure: LUMBAR FOUR-FIVE  MAXIMUM ACCESS (MAS) POSTERIOR LUMBAR INTERBODY FUSION (PLIF) with extension of  instrumentation LUMBAR TWO-FIVE;  Surgeon: Tia Alertavid S Jones, MD;  [facility];  Service: Neurosurgery;  Laterality: N/A;  . MAXIMUM ACCESS (MAS)POSTERIOR LUMBAR INTERBODY FUSION (PLIF) 2 LEVEL N/A 12/13/2015   Procedure: Lumbar two-three - Lumbar three-four MAXIMUM ACCESS (MAS) POSTERIOR LUMBAR INTERBODY FUSION (PLIF)  ;  Surgeon: Tia Alertavid S Jones, MD;  [facility];  Service: Neurosurgery;  Laterality: N/A;  . SHOULDER SURGERY     RIGHT   . TONSILLECTOMY      There were no vitals filed for this visit.      Subjective Assessment - 07/09/16 0851    Subjective Patient reports she continues with pain in back and neck and is working on exercising. She is not sleeping, resting well and is tired today.   Pertinent History Patient reports progressive worsening back and leg pain over the past year and a half. She had surgery 12/13/2015 for fusion L2-3, L3-4 and then increased pain with radiculopathy in left LE with subsequent surgery PLIF L4-5 04/25/2016. Since surgery she has been gradually increasing activity.    Limitations Sitting;Standing;Walking;House hold activities   How long can you sit comfortably? 30 min.   How long can  you stand comfortably? 10 min.   How long can you walk comfortably? 30 min.   Patient Stated Goals to return to full activity without difficulty   Currently in Pain? Yes   Pain Score 5    Pain Location Back   Pain Orientation Right;Left;Mid;Lower   Pain Descriptors / Indicators Aching   Pain Type Acute pain   Pain Onset More than a month ago     Objective: Observation: Gait; slow cadence, forward head posture, decreased step length bilaterally, decreased trunk rotation Palpation: hypersensitive to touch along both sides of incision; spasms in paraspinal muscles  Treatment:  Therapeutic exercises: patient performed exercises with guidance, verbal and tactile cues and demonstration of PT: in conjunction while receiving estim. To back Sitting: Hip  adduction with ball and glute sets x 10 reps Hip abduction with resistive band x 15 reps Knee flexion with resistive band x 15 reps each with assist of PT Rocker board for DF/PF x 5 min. Knee extension with tapping balance stones x 15 reps with VC  Modalities: Electrical stimulation: high volt for muscle spasms: (2) electrodes applied to lower back lumbar paravertebral muscles with patient in sitting with back supported and LE's supported x 25 min.goal: pain, spasms  Patient response to treatment: Patient demonstrated improved posture, alertness and gait pattern following treatment. She reported 30% decreased pain following estim. No adverse reactions to estim. Noted following treatment.          PT Education - 07/09/16 0932    Education provided Yes   Education Details HEP: stabilization in sitting with ball, hip adduction with glute sets, hip abduction with resistive band, knee flexion with resistive band   Person(s) Educated Patient   Methods Explanation;Demonstration;Verbal cues   Comprehension Verbalized understanding;Returned demonstration;Verbal cues required             PT Long Term Goals - 06/25/16 1012      PT LONG TERM GOAL #1   Title patient will demonstrate improved function with decreased back pain with MODI score of 20% or less by 08/06/2016   Baseline MODI 34% (moderate self perceived disability)   Status New     PT LONG TERM GOAL #2   Title Patient  will demonstrate good knowledge of posture and body mechanics for back and be able to stand and walk > 1200' by 08/06/2016 demonstrating improved endurance for community activities   Baseline unable to walk even short distances without increased back and left LE pain/spasms   Status New     PT LONG TERM GOAL #3   Title Improve  LEFS score to 40/80 or better  indicating improved function with daily tasks, improved LE strength by 08/06/2016   Baseline LEFS 29/80   Status New     PT LONG TERM GOAL #4   Title  Patient will be independent with home exercises without cuing for core control, strengthening to allow self management once discharged from physical therapy 08/06/16   Baseline limited knowledge and maximal VC required for guided exercises and progression    Status New               Plan - 07/09/16 0932    Clinical Impression Statement Patient demonstrated improved posture, increased cadence and alertness following treatment indicating good response with decreased pain, spasms.     Rehab Potential Good   Clinical Impairments Affecting Rehab Potential (+) motivated (-) chronic condition of pain in hip/low back; multiple surgeries on spine within 6 months  PT Frequency 2x / week   PT Duration 6 weeks   PT Treatment/Interventions Manual techniques;Electrical Stimulation;Moist Heat;Patient/family education;Cryotherapy;Therapeutic exercise;Dry needling;Ultrasound   PT Next Visit Plan pain control, progress exercises as tolerated   PT Home Exercise Plan home program for stabilization      Patient will benefit from skilled therapeutic intervention in order to improve the following deficits and impairments:  Pain, Impaired flexibility, Difficulty walking, Increased muscle spasms, Decreased strength, Impaired perceived functional ability, Decreased activity tolerance, Decreased range of motion, Decreased endurance  Visit Diagnosis: Muscle weakness (generalized)  Bilateral low back pain without sciatica, unspecified chronicity  Difficulty in walking, not elsewhere classified     Problem List Patient Active Problem List   Diagnosis Date Noted  . S/P lumbar spinal fusion 12/13/2015  . Controlled type 2 diabetes mellitus without complication (HCC) 05/16/2015  . Thrombocytopenia (HCC) 05/16/2015  . Recurrent major depressive disorder, in full remission (HCC) 05/16/2015  . Essential (primary) hypertension 05/16/2015  . Fatty infiltration of liver 03/15/2015  . Aortic valve stenosis,  nonrheumatic 01/18/2015  . Sciatica of right side 01/09/2015  . Type 2 diabetes mellitus (HCC) 11/10/2014  . RLS (restless legs syndrome) 08/23/2014  . Neuritis or radiculitis due to rupture of lumbar intervertebral disc 06/13/2014  . Degeneration of intervertebral disc of lumbar region 06/13/2014  . Arthritis 01/23/2014  . Arthritis of knee, degenerative 11/15/2013  . Depression 09/29/2013  . Insomnia 09/29/2013  . Apnea, sleep 08/29/2013  . Calculus of kidney 08/29/2013  . Abnormal presence of protein in urine 08/29/2013  . Headache, migraine 08/29/2013  . BP (high blood pressure) 08/29/2013  . HLD (hyperlipidemia) 08/29/2013  . Allergic rhinitis 08/29/2013  . Intractable migraine without aura 02/18/2013    Beacher May PT 07/10/2016, 3:49 PM  [place] [facility]TS [facility]IC[facility] [address], [place], [address] Phone: [phone]   Fax:  [phone]  Name: Jessica Cole MRN: [id number] Date of Birth: 04-Oct-1950

## 2016-07-14 ENCOUNTER — Encounter: Payer: Medicare HMO | Admitting: Physical Therapy

## 2016-07-17 ENCOUNTER — Encounter: Payer: Self-pay | Admitting: Physical Therapy

## 2016-07-17 ENCOUNTER — Ambulatory Visit: Payer: Medicare HMO | Admitting: Physical Therapy

## 2016-07-17 DIAGNOSIS — M545 Low back pain, unspecified: Secondary | ICD-10-CM

## 2016-07-17 DIAGNOSIS — M6281 Muscle weakness (generalized): Secondary | ICD-10-CM | POA: Diagnosis not present

## 2016-07-17 DIAGNOSIS — R262 Difficulty in walking, not elsewhere classified: Secondary | ICD-10-CM

## 2016-07-18 NOTE — Therapy (Signed)
[facility] PHYSICAL AND SPORTS MEDICINE [address] [place], [place], [address] Phone: [phone]   Fax:  [phone]  Physical Therapy Treatment  Patient Details  Name: Jessica Cole MRN: [id number] Date of Birth: 10-07-1950 Referring Provider: Tia AlertJones, David S MD  Encounter Date: 07/17/2016      PT End of Session - 07/17/16 0829    Visit Number 3   Number of Visits 12   Date for PT Re-Evaluation 08/06/16   Authorization Type 3   Authorization Time Period 10 G code   PT Start Time 0823   PT Stop Time 0911   PT Time Calculation (min) 48 min   Activity Tolerance Patient tolerated treatment well;Patient limited by pain   Behavior [facility] for tasks assessed/performed      Past Medical History:  Diagnosis Date  . Anxiety   . Colon polyps   . Degenerative arthritis   . Depression   . Diabetes mellitus without complication (HCC)    Type II  . Environmental allergies   . Family history of adverse reaction to anesthesia    sister- PONV  . Fatty liver   . Glaucoma   . Headache(784.0)    HX  MIGRAINES  . History of bronchitis   . History of kidney stones   . History of pneumonia   . Hypertension   . Hypothyroidism   . Obesity   . OSA on CPAP   . Plantar fasciitis    right  . PONV (postoperative nausea and vomiting)   . Renal calculi   . Renal calculi   . RLS (restless legs syndrome) 08/23/2014    Past Surgical History:  Procedure Laterality Date  . ABDOMINAL HYSTERECTOMY     partial  . APPENDECTOMY    . Arthroscopic surgery, knee Left   . BREAST BIOPSY Right    several  . BUNIONECTOMY     LEFT   . COLONOSCOPY W/ POLYPECTOMY    . FOOT SURGERY     RIGHT    . KNEE ARTHROSCOPY W/ MENISCAL REPAIR Left   . LASIK    . MAXIMUM ACCESS (MAS)POSTERIOR LUMBAR INTERBODY FUSION (PLIF) 1 LEVEL N/A 04/25/2016   Procedure: LUMBAR FOUR-FIVE  MAXIMUM ACCESS (MAS) POSTERIOR LUMBAR INTERBODY FUSION (PLIF) with extension of  instrumentation LUMBAR TWO-FIVE;  Surgeon: Tia Alertavid S Jones, MD;  [facility];  Service: Neurosurgery;  Laterality: N/A;  . MAXIMUM ACCESS (MAS)POSTERIOR LUMBAR INTERBODY FUSION (PLIF) 2 LEVEL N/A 12/13/2015   Procedure: Lumbar two-three - Lumbar three-four MAXIMUM ACCESS (MAS) POSTERIOR LUMBAR INTERBODY FUSION (PLIF)  ;  Surgeon: Tia Alertavid S Jones, MD;  [facility];  Service: Neurosurgery;  Laterality: N/A;  . SHOULDER SURGERY     RIGHT   . TONSILLECTOMY      There were no vitals filed for this visit.      Subjective Assessment - 07/17/16 0825    Subjective Patient reports she is still not resting as well as she would like. She is dealing with family issues and this is stressful.    Pertinent History Patient reports progressive worsening back and leg pain over the past year and a half. She had surgery 12/13/2015 for fusion L2-3, L3-4 and then increased pain with radiculopathy in left LE with subsequent surgery PLIF L4-5 04/25/2016. Since surgery she has been gradually increasing activity.    Limitations Sitting;Standing;Walking;House hold activities   How long can you sit comfortably? 30 min.   How long can you  stand comfortably? 10 min.   How long can you walk comfortably? 30 min.   Patient Stated Goals to return to full activity without difficulty   Currently in Pain? Yes   Pain Score 1    Pain Location Back   Pain Orientation Right;Lower   Pain Descriptors / Indicators Aching   Pain Onset More than a month ago   Pain Frequency Intermittent      Objective:  Posture: guarded  Treatment: Therapeutic exercise: patient performed with tactile, verbal cues and demonstration of therapist Sitting: Hip adduction with ball with glute sets x 15 reps Hip abduction with manual resistance x 10 reps Knee flexion with resistive band 2 x 15 reps Knee extension with 2# weights 2 x 15 reps Standing side stepping along airex balance beam x 1-2 min. Walking forward and backwards x 2 min.  With VC to separate feet to increase BOS for safety Step ups onto airex beam x 10 reps each LE Pallof press facing forward and sideways at cable 5# x 10 reps with VC and demonstration for correct alignment and technique   Patient response to treatment: Patient demonstrated improved technique with exercises with minimal VC for correct alignment. Patient reported soreness in back and LE's following exercises. No worse at end of session.            PT Education - 07/17/16 [phone]    Education provided Yes   Education Details HEP: re assessed exercises for stabilization; add walking forward and backward and side stepping and Pallof press with resistive bands   Person(s) Educated Patient   Methods Explanation;Demonstration;Verbal cues   Comprehension Verbalized understanding;Returned demonstration;Verbal cues required             PT Long Term Goals - 07/17/16 0831      PT LONG TERM GOAL #1   Title patient will demonstrate improved function with decreased back pain with MODI score of 20% or less by 08/06/2016   Baseline MODI 34% (moderate self perceived disability) MODI 45% impaired 07/17/2016   Status On-going     PT LONG TERM GOAL #2   Title Patient  will demonstrate good knowledge of posture and body mechanics for back and be able to stand and walk > 1200' by 08/06/2016 demonstrating improved endurance for community activities   Baseline unable to walk even short distances without increased back and left LE pain/spasms; improving 07/17/2016   Status On-going     PT LONG TERM GOAL #3   Title Improve  LEFS score to 40/80 or better  indicating improved function with daily tasks, improved LE strength by 08/06/2016   Baseline LEFS 29/80:  07/17/16 32/80   Status New     PT LONG TERM GOAL #4   Title Patient will be independent with home exercises without cuing for core control, strengthening to allow self management once discharged from physical therapy 08/06/16   Baseline limited knowledge  and maximal VC required for guided exercises and progression    Status On-going               Plan - 07/17/16 0925    Clinical Impression Statement Patient demonstrated improved pain level, decresaed spasms and more erect posture following treatment. She demonstrates steady progress towards all goals and should continue to progress with additional physical therpay intervention.    Rehab Potential Good   Clinical Impairments Affecting Rehab Potential (+) motivated (-) chronic condition of pain in hip/low back; multiple surgeries on spine within 6 months  PT Frequency 2x / week   PT Duration 6 weeks   PT Treatment/Interventions Manual techniques;Electrical Stimulation;Moist Heat;Patient/family education;Cryotherapy;Therapeutic exercise;Dry needling;Ultrasound   PT Next Visit Plan pain control, progress exercises as tolerated   PT Home Exercise Plan home program for stabilization      Patient will benefit from skilled therapeutic intervention in order to improve the following deficits and impairments:  Pain, Impaired flexibility, Difficulty walking, Increased muscle spasms, Decreased strength, Impaired perceived functional ability, Decreased activity tolerance, Decreased range of motion, Decreased endurance  Visit Diagnosis: Muscle weakness (generalized)  Bilateral low back pain without sciatica, unspecified chronicity  Difficulty in walking, not elsewhere classified     Problem List Patient Active Problem List   Diagnosis Date Noted  . S/P lumbar spinal fusion 12/13/2015  . Controlled type 2 diabetes mellitus without complication (HCC) 05/16/2015  . Thrombocytopenia (HCC) 05/16/2015  . Recurrent major depressive disorder, in full remission (HCC) 05/16/2015  . Essential (primary) hypertension 05/16/2015  . Fatty infiltration of liver 03/15/2015  . Aortic valve stenosis, nonrheumatic 01/18/2015  . Sciatica of right side 01/09/2015  . Type 2 diabetes mellitus (HCC)  11/10/2014  . RLS (restless legs syndrome) 08/23/2014  . Neuritis or radiculitis due to rupture of lumbar intervertebral disc 06/13/2014  . Degeneration of intervertebral disc of lumbar region 06/13/2014  . Arthritis 01/23/2014  . Arthritis of knee, degenerative 11/15/2013  . Depression 09/29/2013  . Insomnia 09/29/2013  . Apnea, sleep 08/29/2013  . Calculus of kidney 08/29/2013  . Abnormal presence of protein in urine 08/29/2013  . Headache, migraine 08/29/2013  . BP (high blood pressure) 08/29/2013  . HLD (hyperlipidemia) 08/29/2013  . Allergic rhinitis 08/29/2013  . Intractable migraine without aura 02/18/2013    Beacher May PT 07/18/2016, 9:29 AM  [facility] PHYSICAL AND SPORTS MEDICINE [address], [place], [address] Phone: [phone]   Fax:  [phone]  Name: Jessica Cole MRN: [id number] Date of Birth: 02-06-1951

## 2016-07-21 ENCOUNTER — Encounter: Payer: Self-pay | Admitting: Adult Health

## 2016-07-21 ENCOUNTER — Ambulatory Visit (INDEPENDENT_AMBULATORY_CARE_PROVIDER_SITE_OTHER): Payer: Medicare HMO | Admitting: Adult Health

## 2016-07-21 VITALS — BP 152/88 | HR 66 | Ht 60.0 in | Wt 200.2 lb

## 2016-07-21 DIAGNOSIS — R519 Headache, unspecified: Secondary | ICD-10-CM

## 2016-07-21 DIAGNOSIS — R51 Headache: Secondary | ICD-10-CM | POA: Diagnosis not present

## 2016-07-21 DIAGNOSIS — G8929 Other chronic pain: Secondary | ICD-10-CM | POA: Diagnosis not present

## 2016-07-21 DIAGNOSIS — G2581 Restless legs syndrome: Secondary | ICD-10-CM

## 2016-07-21 DIAGNOSIS — M5441 Lumbago with sciatica, right side: Secondary | ICD-10-CM | POA: Diagnosis not present

## 2016-07-21 NOTE — Progress Notes (Signed)
I have read the note, and I agree with the clinical assessment and plan.  Wei Poplaski KEITH   

## 2016-07-21 NOTE — Progress Notes (Signed)
PATIENT: Jessica Cole DOB: [birth year]  REASON FOR VISIT: follow up- back pain, restless leg, headache HISTORY FROM: patient  HISTORY OF PRESENT ILLNESS: Today 07/21/2016: [name] is a 66 year old female with a history of right-sided back pain with sciatica, restless legs and headaches. She returns today for follow-up. Diazepam was recently added for restless leg symptoms. She states that taking Requip, gabapentin and Valium have been beneficial for her restless leg symptoms. She states that this combination she is able to sleep. She states that her back pain has continued to improve. She believes that all of her additional symptoms including restless legs has also improved since her back pain has also improved. She states for the month of March she has been headache free. She returns today for an evaluation.   HISTORY 01/22/16: Jessica Cole is a 66 year old female with a history of back pain with right-sided sciatica, restless legs and headaches. She returns today for follow-up. She reports in August she had back surgery with Dr. Yetta Barre. She states  recently her pain is slightly worse. She is currently taking hydrocodone. She also reports that her headache frequency in September has increased. Looking at her calendar she had 2 headaches in August. She reports that her headache location varies. She does have light and noise sensitivity. Does report mild nausea. She also reports that since surgery her restless legs has gotten slightly worse. She is currently on gabapentin and Requip. She returns today for an evaluation.  HISTORY 08/06/15: Jessica Cole is a 66 year old female with a history of obesity, low back pain with right-sided sciatica, restless legs and headaches. She returns today for follow-up. She reports that her primary complaint is her ongoing back pain. She states that she had a myelogram with Dr. Yetta Barre and has a follow-up appointment tomorrow. She is hoping that surgery will be an option  for her. She reports that her back pain exacerbates her restless legs. She states that she feels that the Requip would work well for her if she did not have back pain as well. The patient is currently taking trazodone to help with sleep however she states that the back pain usually prohibits this. She continues to take oxycodone for her discomfort. She denies any new neurological symptoms. She returns today for an evaluation.  HISTORY 05/08/15 (WILLIS): Jessica Cole is a 66 year old right-handed white female with a history of obesity, low back pain with right-sided sciatica, and migraine headache. The patient also reports difficulty with restless leg syndrome. The patient has undergone MRI evaluation of the low back showing possible right L3 nerve root compression, the patient has been seen by neurosurgery. The patient has not been considered for surgery at this time, she was sent for physical therapy. The patient is engaged in physical therapy at this time. The patient reports some variability with her headaches, in October and November, she had 3 headaches each month, but she had 11 headaches in December. The patient indicates that weather may be a factor in her headache. The patient is on gabapentin and Cymbalta currently. She takes trazodone at night 200 mg for sleep. She has restless leg syndrome and she takes Requip taking 2 mg twice daily. The patient is having some issues with restless legs at this time. The patient continues to have back pain that wakes her up at night, she has pain in the low back radiating down the leg to the ankle. No weakness has been noted. The patient reports that the pain is now  starting some on the left side.    REVIEW OF SYSTEMS: Out of a complete 14 system review of symptoms, the patient complains only of the following symptoms, and all other reviewed systems are negative.  Cough, shortness of breath, constipation, nausea, restless leg, daytime sleepiness, snoring, moles,  itching, aching muscles, joint pain, joint swelling, frequency of urination, incontinence of bladder, environmental allergies, bruise/bleed easily, dizziness, headache, weakness, behavior problem, depression, nervous/hallucinations, appetite change, fatigue  ALLERGIES: Allergies  Allergen Reactions  . Dexamethasone Other (See Comments)    During a tapered dose, once.  Was shaky (side effect, not allergy).    HOME MEDICATIONS: Outpatient Medications Prior to Visit  Medication Sig Dispense Refill  . azelastine (ASTELIN) 0.1 % nasal spray Place 1 spray into both nostrils 2 (two) times daily.     . B Complex Vitamins (VITAMIN B COMPLEX PO) Take 1 tablet by mouth daily.     . Coenzyme Q10 (CO Q 10) 100 MG CAPS Take 200 mg by mouth daily.     . diazepam (VALIUM) 5 MG tablet Take 1 tablet (5 mg total) by mouth at bedtime. 30 tablet 3  . diclofenac sodium (VOLTAREN) 1 % GEL Apply 4 g topically 2 (two) times daily. (Patient taking differently: Apply 4 g topically daily as needed (for pain). ) 100 g 1  . enalapril (VASOTEC) 10 MG tablet Take 10 mg by mouth daily.    Marland Kitchen estradiol (VIVELLE-DOT) 0.05 MG/24HR patch Place 1 patch onto the skin 2 (two) times a week.     . fexofenadine (ALLER-EASE) 180 MG tablet Take 180 mg by mouth daily.    . fluticasone (FLONASE) 50 MCG/ACT nasal spray Place 2 sprays into both nostrils 2 (two) times daily.     Marland Kitchen gabapentin (NEURONTIN) 800 MG tablet Take 1 tablet (800 mg total) by mouth 2 (two) times daily. 60 tablet 5  . Genistein (I-COOL FOR MENOPAUSE) 30 MG TABS Take 30 mg by mouth every morning.    . latanoprost (XALATAN) 0.005 % ophthalmic solution Place 1 drop into both eyes at bedtime.     Marland Kitchen levothyroxine (SYNTHROID, LEVOTHROID) 112 MCG tablet Take 112 mcg by mouth daily before breakfast.     . losartan (COZAAR) 50 MG tablet Take 50 mg by mouth every evening.     . metFORMIN (GLUCOPHAGE) 500 MG tablet Take 500 mg by mouth 2 (two) times daily with a meal.     .  methocarbamol (ROBAXIN) 500 MG tablet Take 1 tablet (500 mg total) by mouth every 6 (six) hours as needed for muscle spasms. 50 tablet 0  . nabumetone (RELAFEN) 500 MG tablet Take 500 mg by mouth 2 (two) times daily.    . naproxen sodium (ANAPROX) 220 MG tablet Take 440 mg by mouth daily as needed (pain).    . Omega-3 Fatty Acids (FISH OIL ULTRA) 1000 MG CAPS Take 1,000 mg by mouth 2 (two) times daily.     . Red Yeast Rice 600 MG CAPS Take 1,200 mg by mouth 2 (two) times daily.    Marland Kitchen rOPINIRole (REQUIP) 4 MG tablet TAKE ONE (1) TABLET BY MOUTH TWO (2) TIMES DAILY 60 tablet 5  . traZODone (DESYREL) 100 MG tablet TAKE ONE TO TWO TABLETS BY MOUTH AT BEDTIME FOR SLEEP 60 tablet 6  . DULoxetine (CYMBALTA) 30 MG capsule Take 90 mg by mouth daily.     . metoprolol succinate (TOPROL-XL) 25 MG 24 hr tablet TAKE ONE (1) TABLET BY MOUTH EVERY DAY (  Patient not taking: Reported on 07/21/2016) 90 tablet 3  . Oxycodone HCl 10 MG TABS Take 1 tablet (10 mg total) by mouth every 4 (four) hours as needed (for pain). (Patient not taking: Reported on 07/21/2016) 60 tablet 0  . sulindac (CLINORIL) 200 MG tablet Take 200 mg by mouth 2 (two) times daily.     No facility-administered medications prior to visit.     PAST MEDICAL HISTORY: Past Medical History:  Diagnosis Date  . Anxiety   . Colon polyps   . Degenerative arthritis   . Depression   . Diabetes mellitus without complication (HCC)    Type II  . Environmental allergies   . Family history of adverse reaction to anesthesia    sister- PONV  . Fatty liver   . Glaucoma   . Headache(784.0)    HX  MIGRAINES  . History of bronchitis   . History of kidney stones   . History of pneumonia   . Hypertension   . Hypothyroidism   . Obesity   . OSA on CPAP   . Plantar fasciitis    right  . PONV (postoperative nausea and vomiting)   . Renal calculi   . Renal calculi   . RLS (restless legs syndrome) 08/23/2014    PAST SURGICAL HISTORY: Past Surgical  History:  Procedure Laterality Date  . ABDOMINAL HYSTERECTOMY     partial  . APPENDECTOMY    . Arthroscopic surgery, knee Left   . BREAST BIOPSY Right    several  . BUNIONECTOMY     LEFT   . COLONOSCOPY W/ POLYPECTOMY    . FOOT SURGERY     RIGHT    . KNEE ARTHROSCOPY W/ MENISCAL REPAIR Left   . LASIK    . MAXIMUM ACCESS (MAS)POSTERIOR LUMBAR INTERBODY FUSION (PLIF) 1 LEVEL N/A 04/25/2016   Procedure: LUMBAR FOUR-FIVE  MAXIMUM ACCESS (MAS) POSTERIOR LUMBAR INTERBODY FUSION (PLIF) with extension of instrumentation LUMBAR TWO-FIVE;  Surgeon: Tia Alert, MD;  Location: [facility];  Service: Neurosurgery;  Laterality: N/A;  . MAXIMUM ACCESS (MAS)POSTERIOR LUMBAR INTERBODY FUSION (PLIF) 2 LEVEL N/A 12/13/2015   Procedure: Lumbar two-three - Lumbar three-four MAXIMUM ACCESS (MAS) POSTERIOR LUMBAR INTERBODY FUSION (PLIF)  ;  Surgeon: Tia Alert, MD;  [facility]UR[facility];  Service: Neurosurgery;  Laterality: N/A;  . SHOULDER SURGERY     RIGHT   . TONSILLECTOMY      FAMILY HISTORY: Family History  Problem Relation Age of Onset  . Lung cancer Mother   . Congestive Heart Failure Father   . Depression Sister   . Rheum arthritis Sister   . Headache Maternal Grandfather   . Migraines Maternal Grandfather   . Headache Maternal Uncle   . Diabetes    . Heart disease    . Hypertension    . Breast cancer Neg Hx     SOCIAL HISTORY: Social History   Social History  . Marital status: Married    Spouse name: Milinda Cave   . Number of children: 0  . Years of education: 12   Occupational History  . retired    Social History Main Topics  . Smoking status: Never Smoker  . Smokeless tobacco: Never Used  . Alcohol use No  . Drug use: No  . Sexual activity: Not on file   Other Topics Concern  . Not on file   Social History Narrative   Patient is married and lives with her husband.   Patient has a  high school education.   Patient has no children.    Patient works for the [employer]  Dept   Patient is right handed.   Patient drinks occasionally drinks caffeine.      PHYSICAL EXAM  Vitals:   07/21/16 0909  BP: (!) 152/88  Pulse: 66  Weight: 200 lb 3.2 oz (90.8 kg)  Height: 5' (1.524 m)   Body mass index is 39.1 kg/m.  Generalized: Well developed, in no acute distress   Neurological examination  Mentation: Alert oriented to time, place, history taking. Follows all commands speech and language fluent Cranial nerve II-XII: Pupils were equal round reactive to light. Extraocular movements were full, visual field were full on confrontational test. Facial sensation and strength were normal. Uvula tongue midline. Head turning and shoulder shrug  were normal and symmetric. Motor: The motor testing reveals 5 over 5 strength of all 4 extremities. Good symmetric motor tone is noted throughout.  Sensory: Sensory testing is intact to soft touch on all 4 extremities. No evidence of extinction is noted.  Coordination: Cerebellar testing reveals good finger-nose-finger and heel-to-shin bilaterally.  Gait and station: Gait is normal. Tandem gait is Slightly unsteady. Romberg is negative. No drift is seen.  Reflexes: Deep tendon reflexes are symmetric and normal bilaterally.   DIAGNOSTIC DATA (LABS, IMAGING, TESTING) - I reviewed patient records, labs, notes, testing and imaging myself where available.  Lab Results  Component Value Date   WBC 5.9 04/18/2016   HGB 13.3 04/18/2016   HCT 40.5 04/18/2016   MCV 89.6 04/18/2016   PLT 186 04/18/2016      Component Value Date/Time   NA 140 04/18/2016 1008   NA 138 06/08/2013 1558   K 3.8 04/18/2016 1008   K 4.1 06/08/2013 1558   CL 106 04/18/2016 1008   CL 106 06/08/2013 1558   CO2 24 04/18/2016 1008   CO2 29 06/08/2013 1558   GLUCOSE 159 (H) 04/18/2016 1008   GLUCOSE 93 06/08/2013 1558   BUN 15 04/18/2016 1008   BUN 11 06/08/2013 1558   CREATININE 0.78 04/18/2016 1008   CREATININE 0.64 06/08/2013 1558   CALCIUM 9.9  04/18/2016 1008   CALCIUM 9.6 06/08/2013 1558   PROT 6.5 04/18/2016 1008   PROT 7.5 08/01/2011 1307   ALBUMIN 3.9 04/18/2016 1008   ALBUMIN 4.3 08/01/2011 1307   AST 26 04/18/2016 1008   AST 47 (H) 08/01/2011 1307   ALT 29 04/18/2016 1008   ALT 69 08/01/2011 1307   ALKPHOS 56 04/18/2016 1008   ALKPHOS 69 08/01/2011 1307   BILITOT 0.6 04/18/2016 1008   BILITOT 0.4 08/01/2011 1307   GFRNONAA >60 04/18/2016 1008   GFRNONAA >60 06/08/2013 1558   GFRAA >60 04/18/2016 1008   GFRAA >60 06/08/2013 1558   Lab Results  Component Value Date   CHOL 225 (H) 08/02/2011   HDL 32 (L) 08/02/2011   LDLCALC 159 (H) 08/02/2011   TRIG 171 08/02/2011   Lab Results  Component Value Date   HGBA1C 6.6 (H) 04/18/2016   No results found for: VITAMINB12 No results found for: TSH    ASSESSMENT AND PLAN 66 y.o. year old female  has a past medical history of Anxiety; Colon polyps; Degenerative arthritis; Depression; Diabetes mellitus without complication (HCC); Environmental allergies; Family history of adverse reaction to anesthesia; Fatty liver; Glaucoma; Headache(784.0); History of bronchitis; History of kidney stones; History of pneumonia; Hypertension; Hypothyroidism; Obesity; OSA on CPAP; Plantar fasciitis; PONV (postoperative nausea and vomiting); Renal calculi; Renal  calculi; and RLS (restless legs syndrome) (08/23/2014). here with:  1. Back pain 2. Restless leg syndrome 3. Headaches  Overall the patient has remained stable. She will continue on Requip, gabapentin and diazepam. Advised the patient in the future we may be able to wean her off one of these medications. Fortunately the patient has not had any headaches this month. We will continue to monitor. Advised that if her symptoms worsen or she develops new symptoms she should let us know. She will follow-up in 6 months or sooner if needed.     Butch PennyMegan Cammi Consalvo, MSN, NP-C 07/21/2016, 9:38 AM [facility] [address],  [address] [place], [place] [address] [phone]

## 2016-07-21 NOTE — Patient Instructions (Signed)
Continue requip and Gabapentin Continue Valium If your symptoms worsen or you develop new symptoms please let us know.

## 2016-07-22 ENCOUNTER — Ambulatory Visit: Payer: Medicare HMO | Admitting: Physical Therapy

## 2016-07-22 ENCOUNTER — Encounter: Payer: Self-pay | Admitting: Physical Therapy

## 2016-07-22 DIAGNOSIS — M6281 Muscle weakness (generalized): Secondary | ICD-10-CM | POA: Diagnosis not present

## 2016-07-22 DIAGNOSIS — R262 Difficulty in walking, not elsewhere classified: Secondary | ICD-10-CM

## 2016-07-22 DIAGNOSIS — M545 Low back pain, unspecified: Secondary | ICD-10-CM

## 2016-07-22 NOTE — Therapy (Signed)
[place] [facility]ICINE [address] [place], [place], [address] Phone: [phone]   Fax:  [phone]  Physical Therapy Treatment  Patient Details  Name: Jessica Cole MRN: [id number] Date of Birth: [birth year] Referring Provider: Tia AlertJones, David S MD  Encounter Date: 07/22/2016      PT End of Session - 07/22/16 0916    Visit Number 4   Number of Visits 12   Date for PT Re-Evaluation 08/06/16   Authorization Type 4   Authorization Time Period 10 G code   PT Start Time 0910   PT Stop Time 0944   PT Time Calculation (min) 34 min   Activity Tolerance Patient tolerated treatment well;Patient limited by pain   Behavior During Therapy [facility] for tasks assessed/performed      Past Medical History:  Diagnosis Date  . Anxiety   . Colon polyps   . Degenerative arthritis   . Depression   . Diabetes mellitus without complication (HCC)    Type II  . Environmental allergies   . Family history of adverse reaction to anesthesia    sister- PONV  . Fatty liver   . Glaucoma   . Headache(784.0)    HX  MIGRAINES  . History of bronchitis   . History of kidney stones   . History of pneumonia   . Hypertension   . Hypothyroidism   . Obesity   . OSA on CPAP   . Plantar fasciitis    right  . PONV (postoperative nausea and vomiting)   . Renal calculi   . Renal calculi   . RLS (restless legs syndrome) 08/23/2014    Past Surgical History:  Procedure Laterality Date  . ABDOMINAL HYSTERECTOMY     partial  . APPENDECTOMY    . Arthroscopic surgery, knee Left   . BREAST BIOPSY Right    several  . BUNIONECTOMY     LEFT   . COLONOSCOPY W/ POLYPECTOMY    . FOOT SURGERY     RIGHT    . KNEE ARTHROSCOPY W/ MENISCAL REPAIR Left   . LASIK    . MAXIMUM ACCESS (MAS)POSTERIOR LUMBAR INTERBODY FUSION (PLIF) 1 LEVEL N/A 04/25/2016   Procedure: LUMBAR FOUR-FIVE  MAXIMUM ACCESS (MAS) POSTERIOR LUMBAR INTERBODY FUSION (PLIF) with extension of  instrumentation LUMBAR TWO-FIVE;  Surgeon: Tia Alertavid S Jones, MD;  [facility];  Service: Neurosurgery;  Laterality: N/A;  . MAXIMUM ACCESS (MAS)POSTERIOR LUMBAR INTERBODY FUSION (PLIF) 2 LEVEL N/A 12/13/2015   Procedure: Lumbar two-three - Lumbar three-four MAXIMUM ACCESS (MAS) POSTERIOR LUMBAR INTERBODY FUSION (PLIF)  ;  Surgeon: Tia Alertavid S Jones, MD;  [facility];  Service: Neurosurgery;  Laterality: N/A;  . SHOULDER SURGERY     RIGHT   . TONSILLECTOMY      There were no vitals filed for this visit.      Subjective Assessment - 07/22/16 0912    Subjective Patient reports she woke up late and is feeling stressed on arrival. 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    Pertinent History Patient reports progressive worsening back and leg pain over the past year and a half. She had surgery 12/13/2015 for fusion L2-3, L3-4 and then increased pain with radiculopathy in left LE with subsequent surgery PLIF L4-5 04/25/2016. Since surgery she has been gradually increasing activity.    Limitations Sitting;Standing;Walking;House hold activities   How long can you sit comfortably? 30 min.   How long can  you stand comfortably? 10 min.   How long can you walk comfortably? 30 min.   Patient Stated Goals to return to full activity without difficulty   Currently in Pain? Yes   Pain Score 2    Pain Location Back   Pain Orientation Right;Lower   Pain Descriptors / Indicators Aching   Pain Type Acute pain   Pain Onset More than a month ago   Pain Frequency Intermittent      Objective:  Gait and Posture: independent without AD with guarded posture, slow cadence  Treatment: Therapeutic exercise: patient performed with tactile, verbal cues and demonstration of therapist Sitting: Hip adduction with ball with glute sets x 15 reps Rhythmic stabilization sitting with manual resistance forward and back and side/side x 10 reps each 3# weight overhead bilateral flexion x 10  reps Standing side stepping along airex balance beam x 1-2 min. Walking forward and backwards with UE on counter for safety and close supervision x 2 min. With VC to separate feet to increase BOS for safety Step ups onto airex beam x 10 reps each LE, using UE for support/balance Pallof press facing forward, performed in sitting position, with resistive band (green) 2 x 15 reps Diagonal walk with 2# weights in hands x 1 min.with close supervision for safety; patient demonstrated occasional additional step to right or left with good recovery  Patient response to treatment: Patient demonstrated improved technique and motor control with VC and repetition. She reported soreness in back throughout exercises and required close supervision for all standing and walking exercises for safety. No worse at end of session.            PT Education - 07/22/16 0915    Education provided Yes   Education Details HEP: re assessed sitting stabilization   Person(s) Educated Patient   Methods Explanation;Verbal cues   Comprehension Verbalized understanding;Verbal cues required             PT Long Term Goals - 07/17/16 0831      PT LONG TERM GOAL #1   Title patient will demonstrate improved function with decreased back pain with MODI score of 20% or less by 08/06/2016   Baseline MODI 34% (moderate self perceived disability) MODI 45% impaired 07/17/2016   Status On-going     PT LONG TERM GOAL #2   Title Patient  will demonstrate good knowledge of posture and body mechanics for back and be able to stand and walk > 1200' by 08/06/2016 demonstrating improved endurance for community activities   Baseline unable to walk even short distances without increased back and left LE pain/spasms; improving 07/17/2016   Status On-going     PT LONG TERM GOAL #3   Title Improve  LEFS score to 40/80 or better  indicating improved function with daily tasks, improved LE strength by 08/06/2016   Baseline LEFS 29/80:  07/17/16  32/80   Status New     PT LONG TERM GOAL #4   Title Patient will be independent with home exercises without cuing for core control, strengthening to allow self management once discharged from physical therapy 08/06/16   Baseline limited knowledge and maximal VC required for guided exercises and progression    Status On-going               Plan - 07/22/16 0916    Clinical Impression Statement Patient demonstrated decreased spasms, improved posture and ability to correct. She is compliant with standing, walking exercise. She has primary limiting factor  of pain and decreased endurance and strength in core/LE's and will benefit from continued physical therapy intervention to achieve goals.    Rehab Potential Good   Clinical Impairments Affecting Rehab Potential (+) motivated (-) chronic condition of pain in hip/low back; multiple surgeries on spine within 6 months   PT Frequency 2x / week   PT Duration 6 weeks   PT Treatment/Interventions Manual techniques;Electrical Stimulation;Moist Heat;Patient/family education;Cryotherapy;Therapeutic exercise;Dry needling;Ultrasound   PT Next Visit Plan pain control, progress exercises as tolerated; stabilization exercises   PT Home Exercise Plan home program for stabilization and walking exercises      Patient will benefit from skilled therapeutic intervention in order to improve the following deficits and impairments:  Pain, Impaired flexibility, Difficulty walking, Increased muscle spasms, Decreased strength, Impaired perceived functional ability, Decreased activity tolerance, Decreased range of motion, Decreased endurance  Visit Diagnosis: Muscle weakness (generalized)  Bilateral low back pain without sciatica, unspecified chronicity  Difficulty in walking, not elsewhere classified     Problem List Patient Active Problem List   Diagnosis Date Noted  . S/P lumbar spinal fusion 12/13/2015  . Controlled type 2 diabetes mellitus without  complication (HCC) 05/16/2015  . Thrombocytopenia (HCC) 05/16/2015  . Recurrent major depressive disorder, in full remission (HCC) 05/16/2015  . Essential (primary) hypertension 05/16/2015  . Fatty infiltration of liver 03/15/2015  . Aortic valve stenosis, nonrheumatic 01/18/2015  . Sciatica of right side 01/09/2015  . Type 2 diabetes mellitus (HCC) 11/10/2014  . RLS (restless legs syndrome) 08/23/2014  . Neuritis or radiculitis due to rupture of lumbar intervertebral disc 06/13/2014  . Degeneration of intervertebral disc of lumbar region 06/13/2014  . Arthritis 01/23/2014  . Arthritis of knee, degenerative 11/15/2013  . Depression 09/29/2013  . Insomnia 09/29/2013  . Apnea, sleep 08/29/2013  . Calculus of kidney 08/29/2013  . Abnormal presence of protein in urine 08/29/2013  . Headache, migraine 08/29/2013  . BP (high blood pressure) 08/29/2013  . HLD (hyperlipidemia) 08/29/2013  . Allergic rhinitis 08/29/2013  . Intractable migraine without aura 02/18/2013    Beacher May PT 07/22/2016, 9:50 AM  [place] [facility] PHYSICAL AND SPORTS MEDICINE [address], [place], [address] Phone: [phone]   Fax:  [phone]  Name: Jessica Cole MRN: [id number] Date of Birth: [birth year]

## 2016-07-24 ENCOUNTER — Encounter: Payer: Self-pay | Admitting: Physical Therapy

## 2016-07-24 ENCOUNTER — Ambulatory Visit: Payer: Medicare HMO | Admitting: Physical Therapy

## 2016-07-24 DIAGNOSIS — M545 Low back pain, unspecified: Secondary | ICD-10-CM

## 2016-07-24 DIAGNOSIS — M6281 Muscle weakness (generalized): Secondary | ICD-10-CM

## 2016-07-24 DIAGNOSIS — R262 Difficulty in walking, not elsewhere classified: Secondary | ICD-10-CM

## 2016-07-24 NOTE — Therapy (Signed)
[place] [facility] [address] [place], [address] Phone: [phone]   Fax:  [phone]  Physical Therapy Treatment  Patient Details  Name: Jessica Cole MRN: [id number] Date of Birth: 07-13-1950 Referring Provider: Tia Alert MD  Encounter Date: 07/24/2016      PT End of Session - 07/24/16 0950    Visit Number 5   Number of Visits 12   Date for PT Re-Evaluation 08/06/16   Authorization Type 5   Authorization Time Period 10 G code   PT Start Time 0905   PT Stop Time 0942   PT Time Calculation (min) 37 min   Activity Tolerance Patient tolerated treatment well;Patient limited by pain   Behavior During Therapy [facility] for tasks assessed/performed      Past Medical History:  Diagnosis Date  . Anxiety   . Colon polyps   . Degenerative arthritis   . Depression   . Diabetes mellitus without complication (HCC)    Type II  . Environmental allergies   . Family history of adverse reaction to anesthesia    sister- PONV  . Fatty liver   . Glaucoma   . Headache(784.0)    HX  MIGRAINES  . History of bronchitis   . History of kidney stones   . History of pneumonia   . Hypertension   . Hypothyroidism   . Obesity   . OSA on CPAP   . Plantar fasciitis    right  . PONV (postoperative nausea and vomiting)   . Renal calculi   . Renal calculi   . RLS (restless legs syndrome) 08/23/2014    Past Surgical History:  Procedure Laterality Date  . ABDOMINAL HYSTERECTOMY     partial  . APPENDECTOMY    . Arthroscopic surgery, knee Left   . BREAST BIOPSY Right    several  . BUNIONECTOMY     LEFT   . COLONOSCOPY W/ POLYPECTOMY    . FOOT SURGERY     RIGHT    . KNEE ARTHROSCOPY W/ MENISCAL REPAIR Left   . LASIK    . MAXIMUM ACCESS (MAS)POSTERIOR LUMBAR INTERBODY FUSION (PLIF) 1 LEVEL N/A 04/25/2016   Procedure: LUMBAR FOUR-FIVE  MAXIMUM ACCESS (MAS) POSTERIOR LUMBAR INTERBODY FUSION (PLIF) with extension of  instrumentation LUMBAR TWO-FIVE;  Surgeon: Tia Alert, MD;  Location: [facility] OR;  Service: Neurosurgery;  Laterality: N/A;  . MAXIMUM ACCESS (MAS)POSTERIOR LUMBAR INTERBODY FUSION (PLIF) 2 LEVEL N/A 12/13/2015   Procedure: Lumbar two-three - Lumbar three-four MAXIMUM ACCESS (MAS) POSTERIOR LUMBAR INTERBODY FUSION (PLIF)  ;  Surgeon: Tia Alert, MD;  Location: [facility] NEURO ORS;  Service: Neurosurgery;  Laterality: N/A;  . SHOULDER SURGERY     RIGHT   . TONSILLECTOMY      There were no vitals filed for this visit.      Subjective Assessment - 07/24/16 0905    Subjective Patient rpeorts sheis really sore today from exercising.    Pertinent History Patient reports progressive worsening back and leg pain over the past year and a half. She had surgery 12/13/2015 for fusion L2-3, L3-4 and then increased pain with radiculopathy in left LE with subsequent surgery PLIF L4-5 04/25/2016. Since surgery she has been gradually increasing activity.    Limitations Sitting;Standing;Walking;House hold activities   How long can you sit comfortably? 30 min.   How long can you stand comfortably? 10 min.   How long can you walk comfortably? 30 min.  Patient Stated Goals to return to full activity without difficulty   Currently in Pain? Yes   Pain Score 4    Pain Location Back   Pain Orientation Right;Lower   Pain Descriptors / Indicators Aching;Sore   Pain Type Acute pain;Chronic pain   Pain Onset More than a month ago   Pain Frequency Intermittent      Objective:  Gait and Posture: independent without AD with guarded posture, slow cadence  Treatment: Therapeutic exercise: patient performed with tactile, verbal cues and demonstration of therapist Sitting: (moist heat applied to bilateral hips/lower back during sitting LE exercises x 15 min. Unbilled time for heat: goal: pain; no adverse reactions noted) Hip adduction with ball with glute sets x 15 reps Rhythmic stabilization sitting with manual  resistance forward and back and side/side x 10 reps each Seated knee extension 2 x 15 with 3# ankle weights Knee flexion with red resistive band with assist of therapist 2 x 15 reps Hip abduction with resistive band and assist of therapist x 15 reps 3# weight overhead bilateral flexion x 10 reps Side to side weight shift in sitting with feet on balance stones 10 reps to each side Pallof press facing forward, performed in sitting position, with resistive band (green) 2 x 15 reps  Standing:  Standing side stepping along airex balance beam x 1-2 min. Step ups onto balance stones x 15 reps each LE, using UE for support/balance   Patient response to treatment: Patient demonstrated improved technique and motor control with VC and repetition. She reported soreness in back throughout exercises and required close supervision for all standing and walking exercises for safety. No worse at end of session.           PT Education - 07/24/16 0945    Education provided Yes   Education Details HEP: re assessed sitting and standing exercises for stabilization   Person(s) Educated Patient   Methods Explanation;Demonstration;Verbal cues   Comprehension Verbalized understanding;Returned demonstration;Verbal cues required             PT Long Term Goals - 07/17/16 0831      PT LONG TERM GOAL #1   Title patient will demonstrate improved function with decreased back pain with MODI score of 20% or less by 08/06/2016   Baseline MODI 34% (moderate self perceived disability) MODI 45% impaired 07/17/2016   Status On-going     PT LONG TERM GOAL #2   Title Patient  will demonstrate good knowledge of posture and body mechanics for back and be able to stand and walk > 1200' by 08/06/2016 demonstrating improved endurance for community activities   Baseline unable to walk even short distances without increased back and left LE pain/spasms; improving 07/17/2016   Status On-going     PT LONG TERM GOAL #3    Title Improve  LEFS score to 40/80 or better  indicating improved function with daily tasks, improved LE strength by 08/06/2016   Baseline LEFS 29/80:  07/17/16 32/80   Status New     PT LONG TERM GOAL #4   Title Patient will be independent with home exercises without cuing for core control, strengthening to allow self management once discharged from physical therapy 08/06/16   Baseline limited knowledge and maximal VC required for guided exercises and progression    Status On-going               Plan - 07/24/16 0946    Clinical Impression Statement Patient is limited in abiltiy  to perform exercise due to pain with all movement and fatigue. She is compliant with home program and is motivated to continue with therapy.    Rehab Potential Good   Clinical Impairments Affecting Rehab Potential (+) motivated (-) chronic condition of pain in hip/low back; multiple surgeries on spine within 6 months   PT Frequency 2x / week   PT Duration 6 weeks   PT Treatment/Interventions Manual techniques;Electrical Stimulation;Moist Heat;Patient/family education;Cryotherapy;Therapeutic exercise;Dry needling;Ultrasound   PT Next Visit Plan pain control, progress exercises as tolerated; stabilization exercises   PT Home Exercise Plan home program for stabilization and walking exercises      Patient will benefit from skilled therapeutic intervention in order to improve the following deficits and impairments:  Pain, Impaired flexibility, Difficulty walking, Increased muscle spasms, Decreased strength, Impaired perceived functional ability, Decreased activity tolerance, Decreased range of motion, Decreased endurance  Visit Diagnosis: Muscle weakness (generalized)  Bilateral low back pain without sciatica, unspecified chronicity  Difficulty in walking, not elsewhere classified     Problem List Patient Active Problem List   Diagnosis Date Noted  . S/P lumbar spinal fusion 12/13/2015  . Controlled type 2  diabetes mellitus without complication (HCC) 05/16/2015  . Thrombocytopenia (HCC) 05/16/2015  . Recurrent major depressive disorder, in full remission (HCC) 05/16/2015  . Essential (primary) hypertension 05/16/2015  . Fatty infiltration of liver 03/15/2015  . Aortic valve stenosis, nonrheumatic 01/18/2015  . Sciatica of right side 01/09/2015  . Type 2 diabetes mellitus (HCC) 11/10/2014  . RLS (restless legs syndrome) 08/23/2014  . Neuritis or radiculitis due to rupture of lumbar intervertebral disc 06/13/2014  . Degeneration of intervertebral disc of lumbar region 06/13/2014  . Arthritis 01/23/2014  . Arthritis of knee, degenerative 11/15/2013  . Depression 09/29/2013  . Insomnia 09/29/2013  . Apnea, sleep 08/29/2013  . Calculus of kidney 08/29/2013  . Abnormal presence of protein in urine 08/29/2013  . Headache, migraine 08/29/2013  . BP (high blood pressure) 08/29/2013  . HLD (hyperlipidemia) 08/29/2013  . Allergic rhinitis 08/29/2013  . Intractable migraine without aura 02/18/2013    Beacher MayBrooks, Marie PT 07/24/2016, 10:36 PM  [place] [facility] [address] [place], [place], [address] Phone: [phone]   Fax:  [phone]  Name: Nobie PutnamGail T Shiley MRN: [id number] Date of Birth: 1950/11/19

## 2016-07-29 ENCOUNTER — Ambulatory Visit: Payer: Medicare HMO | Admitting: Physical Therapy

## 2016-07-29 ENCOUNTER — Encounter: Payer: Self-pay | Admitting: Physical Therapy

## 2016-07-29 DIAGNOSIS — R262 Difficulty in walking, not elsewhere classified: Secondary | ICD-10-CM

## 2016-07-29 DIAGNOSIS — M6281 Muscle weakness (generalized): Secondary | ICD-10-CM

## 2016-07-29 DIAGNOSIS — M545 Low back pain, unspecified: Secondary | ICD-10-CM

## 2016-07-29 NOTE — Therapy (Signed)
[facility] [address] [place], [address] Phone: [phone]   Fax:  [phone]  Physical Therapy Treatment  Patient Details  Name: NYOMIE EHRLICH MRN: [id number] Date of Birth: Jan 30, 1951 Referring Provider: Tia Alert MD  Encounter Date: 07/29/2016      PT End of Session - 07/29/16 1129    Visit Number 6   Number of Visits 12   Date for PT Re-Evaluation 08/06/16   Authorization Type 6   Authorization Time Period 10 G code   PT Start Time 1122   PT Stop Time 1210   PT Time Calculation (min) 48 min   Activity Tolerance Patient tolerated treatment well;Patient limited by pain   Behavior During Therapy [facility] for tasks assessed/performed      Past Medical History:  Diagnosis Date  . Anxiety   . Colon polyps   . Degenerative arthritis   . Depression   . Diabetes mellitus without complication (HCC)    Type II  . Environmental allergies   . Family history of adverse reaction to anesthesia    sister- PONV  . Fatty liver   . Glaucoma   . Headache(784.0)    HX  MIGRAINES  . History of bronchitis   . History of kidney stones   . History of pneumonia   . Hypertension   . Hypothyroidism   . Obesity   . OSA on CPAP   . Plantar fasciitis    right  . PONV (postoperative nausea and vomiting)   . Renal calculi   . Renal calculi   . RLS (restless legs syndrome) 08/23/2014    Past Surgical History:  Procedure Laterality Date  . ABDOMINAL HYSTERECTOMY     partial  . APPENDECTOMY    . Arthroscopic surgery, knee Left   . BREAST BIOPSY Right    several  . BUNIONECTOMY     LEFT   . COLONOSCOPY W/ POLYPECTOMY    . FOOT SURGERY     RIGHT    . KNEE ARTHROSCOPY W/ MENISCAL REPAIR Left   . LASIK    . MAXIMUM ACCESS (MAS)POSTERIOR LUMBAR INTERBODY FUSION (PLIF) 1 LEVEL N/A 04/25/2016   Procedure: LUMBAR FOUR-FIVE  MAXIMUM ACCESS (MAS) POSTERIOR LUMBAR INTERBODY FUSION (PLIF) with extension of  instrumentation LUMBAR TWO-FIVE;  Surgeon: Tia Alert, MD;  Location: [facility] OR;  Service: Neurosurgery;  Laterality: N/A;  . MAXIMUM ACCESS (MAS)POSTERIOR LUMBAR INTERBODY FUSION (PLIF) 2 LEVEL N/A 12/13/2015   Procedure: Lumbar two-three - Lumbar three-four MAXIMUM ACCESS (MAS) POSTERIOR LUMBAR INTERBODY FUSION (PLIF)  ;  Surgeon: Tia Alert, MD;  Location: [facility] NEURO ORS;  Service: Neurosurgery;  Laterality: N/A;  . SHOULDER SURGERY     RIGHT   . TONSILLECTOMY      There were no vitals filed for this visit.      Subjective Assessment - 07/29/16 1127    Subjective Patient reports she is "hurting all over" today with cold rany weather. She reports she is seeing improvement with strength and endurance with standing and walking.    Pertinent History Patient reports progressive worsening back and leg pain over the past year and a half. She had surgery 12/13/2015 for fusion L2-3, L3-4 and then increased pain with radiculopathy in left LE with subsequent surgery PLIF L4-5 04/25/2016. Since surgery she has been gradually increasing activity.    Limitations Sitting;Standing;Walking;House hold activities   How long can you sit comfortably? 30 min.   How  long can you stand comfortably? 10 min.   How long can you walk comfortably? 30 min.   Patient Stated Goals to return to full activity without difficulty   Currently in Pain? Yes   Pain Score 6    Pain Location Back   Pain Orientation Mid;Lower   Pain Descriptors / Indicators Aching;Sore   Pain Type Chronic pain   Pain Onset More than a month ago   Pain Frequency Intermittent      Objective:  Gait and Posture: Independent without AD, improved cadence from previous session Vital signs: BP 138/80; heart rate 63 bpm  Treatment: Therapeutic exercise: patient performed with tactile, verbal cues and demonstration of therapist Sitting:  Hip adduction with ball with glute sets x 15 reps Seated knee extension 2 x 15 with 3# ankle weights Knee  flexion with red resistive band with assist of therapist 2 x 25 reps 3# weight overhead bilateral flexion x 10 reps Pallof press facing forward, performed in sitting position, with resistive band (red) 2 x 15 reps Scapular rows bilateral with stabilization in siting x 15 reps Straight arm pull downs with red resistive band  X 15 reps  Standing:  Standing side stepping along airex balance beam x 1-2 min. Step ups onto balance beam x 15 reps each LE, using UE for support/balance   Moist heat applied to upper back following exercise to decrease pain: 10 min. With patient seated in chair with UE's supported; no adverse reactions noted  Patient response to treatment: patient demonstrated improved technique with exercises with minimal VC for correct alignment and performance. No increased pain reported with exercises. She required close supervision with all standing and walking exercises for safety, no loss of balance noted. Mild to moderate fatigue at end of session.              PT Education - 07/29/16 1215    Education provided Yes   Education Details HEP: continue with exercises, Pallof press forward and lumbar extension with resistive band in sitting   Person(s) Educated Patient   Methods Explanation;Demonstration;Verbal cues   Comprehension Verbalized understanding;Returned demonstration;Verbal cues required             PT Long Term Goals - 07/17/16 0831      PT LONG TERM GOAL #1   Title patient will demonstrate improved function with decreased back pain with MODI score of 20% or less by 08/06/2016   Baseline MODI 34% (moderate self perceived disability) MODI 45% impaired 07/17/2016   Status On-going     PT LONG TERM GOAL #2   Title Patient  will demonstrate good knowledge of posture and body mechanics for back and be able to stand and walk > 1200' by 08/06/2016 demonstrating improved endurance for community activities   Baseline unable to walk even short distances without  increased back and left LE pain/spasms; improving 07/17/2016   Status On-going     PT LONG TERM GOAL #3   Title Improve  LEFS score to 40/80 or better  indicating improved function with daily tasks, improved LE strength by 08/06/2016   Baseline LEFS 29/80:  07/17/16 32/80   Status New     PT LONG TERM GOAL #4   Title Patient will be independent with home exercises without cuing for core control, strengthening to allow self management once discharged from physical therapy 08/06/16   Baseline limited knowledge and maximal VC required for guided exercises and progression    Status On-going  Plan - 07/29/16 1130    Clinical Impression Statement Patient demonstrates reported increased stress which is bothering her and limits her ability to work at home. She is bending and carrying things more than she should, per her report. She is progressing slowly and steadily with strength and endurance as demonstrated with exercises.    Rehab Potential Good   Clinical Impairments Affecting Rehab Potential (+) motivated (-) chronic condition of pain in hip/low back; multiple surgeries on spine within 6 months   PT Frequency 2x / week   PT Duration 6 weeks   PT Treatment/Interventions Manual techniques;Electrical Stimulation;Moist Heat;Patient/family education;Cryotherapy;Therapeutic exercise;Dry needling;Ultrasound   PT Next Visit Plan pain control, progress exercises as tolerated; stabilization exercises   PT Home Exercise Plan home program for stabilization and walking exercises      Patient will benefit from skilled therapeutic intervention in order to improve the following deficits and impairments:  Pain, Impaired flexibility, Difficulty walking, Increased muscle spasms, Decreased strength, Impaired perceived functional ability, Decreased activity tolerance, Decreased range of motion, Decreased endurance  Visit Diagnosis: Muscle weakness (generalized)  Bilateral low back pain without  sciatica, unspecified chronicity  Difficulty in walking, not elsewhere classified     Problem List Patient Active Problem List   Diagnosis Date Noted  . S/P lumbar spinal fusion 12/13/2015  . Controlled type 2 diabetes mellitus without complication (HCC) 05/16/2015  . Thrombocytopenia (HCC) 05/16/2015  . Recurrent major depressive disorder, in full remission (HCC) 05/16/2015  . Essential (primary) hypertension 05/16/2015  . Fatty infiltration of liver 03/15/2015  . Aortic valve stenosis, nonrheumatic 01/18/2015  . Sciatica of right side 01/09/2015  . Type 2 diabetes mellitus (HCC) 11/10/2014  . RLS (restless legs syndrome) 08/23/2014  . Neuritis or radiculitis due to rupture of lumbar intervertebral disc 06/13/2014  . Degeneration of intervertebral disc of lumbar region 06/13/2014  . Arthritis 01/23/2014  . Arthritis of knee, degenerative 11/15/2013  . Depression 09/29/2013  . Insomnia 09/29/2013  . Apnea, sleep 08/29/2013  . Calculus of kidney 08/29/2013  . Abnormal presence of protein in urine 08/29/2013  . Headache, migraine 08/29/2013  . BP (high blood pressure) 08/29/2013  . HLD (hyperlipidemia) 08/29/2013  . Allergic rhinitis 08/29/2013  . Intractable migraine without aura 02/18/2013    Beacher MayBrooks, Marie PT 07/30/2016, 4:33 PM  [place] [facility] [address] [place], [place], [address] Phone: [phone]   Fax:  [phone]  Name: Nobie PutnamGail T Swoveland MRN: [id number] Date of Birth: 1950-08-19

## 2016-07-31 ENCOUNTER — Encounter: Payer: Self-pay | Admitting: Physical Therapy

## 2016-07-31 ENCOUNTER — Ambulatory Visit: Payer: Medicare HMO | Admitting: Physical Therapy

## 2016-07-31 DIAGNOSIS — M545 Low back pain, unspecified: Secondary | ICD-10-CM

## 2016-07-31 DIAGNOSIS — M6281 Muscle weakness (generalized): Secondary | ICD-10-CM | POA: Diagnosis not present

## 2016-07-31 DIAGNOSIS — R262 Difficulty in walking, not elsewhere classified: Secondary | ICD-10-CM

## 2016-07-31 NOTE — Therapy (Signed)
[facility] [address], [place], [address] Phone: [phone]   Fax:  [phone]  Physical Therapy Treatment  Patient Details  Name: Jessica Cole MRN: [id number] Date of Birth: 03-27-1951 Referring Provider: Tia Alert MD  Encounter Date: 07/31/2016      PT End of Session - 07/31/16 0906    Visit Number 7   Number of Visits 12   Date for PT Re-Evaluation 08/06/16   Authorization Type 7   Authorization Time Period 10 G code   PT Start Time 0902   PT Stop Time 0945   PT Time Calculation (min) 43 min   Activity Tolerance Patient tolerated treatment well;Patient limited by pain   Behavior During Therapy [facility] for tasks assessed/performed      Past Medical History:  Diagnosis Date  . Anxiety   . Colon polyps   . Degenerative arthritis   . Depression   . Diabetes mellitus without complication (HCC)    Type II  . Environmental allergies   . Family history of adverse reaction to anesthesia    sister- PONV  . Fatty liver   . Glaucoma   . Headache(784.0)    HX  MIGRAINES  . History of bronchitis   . History of kidney stones   . History of pneumonia   . Hypertension   . Hypothyroidism   . Obesity   . OSA on CPAP   . Plantar fasciitis    right  . PONV (postoperative nausea and vomiting)   . Renal calculi   . Renal calculi   . RLS (restless legs syndrome) 08/23/2014    Past Surgical History:  Procedure Laterality Date  . ABDOMINAL HYSTERECTOMY     partial  . APPENDECTOMY    . Arthroscopic surgery, knee Left   . BREAST BIOPSY Right    several  . BUNIONECTOMY     LEFT   . COLONOSCOPY W/ POLYPECTOMY    . FOOT SURGERY     RIGHT    . KNEE ARTHROSCOPY W/ MENISCAL REPAIR Left   . LASIK    . MAXIMUM ACCESS (MAS)POSTERIOR LUMBAR INTERBODY FUSION (PLIF) 1 LEVEL N/A 04/25/2016   Procedure: LUMBAR FOUR-FIVE  MAXIMUM ACCESS (MAS) POSTERIOR LUMBAR INTERBODY FUSION (PLIF) with extension of  instrumentation LUMBAR TWO-FIVE;  Surgeon: Tia Alert, MD;  Location: [facility] OR;  Service: Neurosurgery;  Laterality: N/A;  . MAXIMUM ACCESS (MAS)POSTERIOR LUMBAR INTERBODY FUSION (PLIF) 2 LEVEL N/A 12/13/2015   Procedure: Lumbar two-three - Lumbar three-four MAXIMUM ACCESS (MAS) POSTERIOR LUMBAR INTERBODY FUSION (PLIF)  ;  Surgeon: Tia Alert, MD;  Location: [facility] NEURO ORS;  Service: Neurosurgery;  Laterality: N/A;  . SHOULDER SURGERY     RIGHT   . TONSILLECTOMY      There were no vitals filed for this visit.      Subjective Assessment - 07/31/16 0903    Subjective Patient reports she is very sore today. She has been baking cakes for the past 2 days.    Pertinent History Patient reports progressive worsening back and leg pain over the past year and a half. She had surgery 12/13/2015 for fusion L2-3, L3-4 and then increased pain with radiculopathy in left LE with subsequent surgery PLIF L4-5 04/25/2016. Since surgery she has been gradually increasing activity.    Limitations Sitting;Standing;Walking;House hold activities   How long can you sit comfortably? 30 min.   How long can you stand comfortably? 10 min.  How long can you walk comfortably? 30 min.   Patient Stated Goals to return to full activity without difficulty   Currently in Pain? Yes   Pain Score 6    Pain Location Back   Pain Orientation Mid;Lower   Pain Descriptors / Indicators Aching;Sore   Pain Type Chronic pain   Pain Onset More than a month ago   Pain Frequency Intermittent     Objective: Vitals:  BP 125/75 left arm sitting following sitting exercise treatment  Treatment: Therapeutic exercise: patient performed with tactile, verbal cues and demonstration of therapist Sitting:  Hip adduction with ball with glute sets x 15 reps Seated knee extension 2 x 15 with 3# ankle weights Knee flexion with red resistive band with assist of therapist 2 x 25 reps 3# weight overhead bilateral flexion x 10 reps Pallof press  facing forward, performed in sitting position, with resistive band (red) 2 x 15 reps Scapular rows bilateral with stabilization in siting x 15 reps Straight arm pull downs with red resistive band  X 15 reps  Standing:  Standing side stepping along airex balance beam x 1-2 min. Step ups onto balance beamx 15reps each LE, using UE for support/balance   Patient response to treatment: patient demonstrated improved technique with exercises with minimal VC for correct alignment and performance. Mild pain/discomfort reported with standing and walking exercises and sitting core exercises. She required close supervision with all standing and walking exercises for safety, no loss of balance noted. Mild to moderate fatigue at end of session. Reported 4/10 pain level at end of session.            PT Education - 07/31/16 0905    Education provided Yes   Education Details HEP; continue with exercises, re assessed exercises for core and LE's   Person(s) Educated Patient   Methods Explanation;Demonstration;Verbal cues   Comprehension Verbalized understanding;Returned demonstration;Verbal cues required             PT Long Term Goals - 07/17/16 0831      PT LONG TERM GOAL #1   Title patient will demonstrate improved function with decreased back pain with MODI score of 20% or less by 08/06/2016   Baseline MODI 34% (moderate self perceived disability) MODI 45% impaired 07/17/2016   Status On-going     PT LONG TERM GOAL #2   Title Patient  will demonstrate good knowledge of posture and body mechanics for back and be able to stand and walk > 1200' by 08/06/2016 demonstrating improved endurance for community activities   Baseline unable to walk even short distances without increased back and left LE pain/spasms; improving 07/17/2016   Status On-going     PT LONG TERM GOAL #3   Title Improve  LEFS score to 40/80 or better  indicating improved function with daily tasks, improved LE strength by  08/06/2016   Baseline LEFS 29/80:  07/17/16 32/80   Status New     PT LONG TERM GOAL #4   Title Patient will be independent with home exercises without cuing for core control, strengthening to allow self management once discharged from physical therapy 08/06/16   Baseline limited knowledge and maximal VC required for guided exercises and progression    Status On-going               Plan - 07/31/16 0906    Clinical Impression Statement Patient demonstrates improvement with strength and endurance and continues with pain as primary limiting factor and is described as  soreness. She is able to perform exercise with less difficulty and improving motor control and demonstrates good carry over between sessions.    Rehab Potential Good   Clinical Impairments Affecting Rehab Potential (+) motivated (-) chronic condition of pain in hip/low back; multiple surgeries on spine within 6 months   PT Frequency 2x / week   PT Duration 6 weeks   PT Treatment/Interventions Manual techniques;Electrical Stimulation;Moist Heat;Patient/family education;Cryotherapy;Therapeutic exercise;Dry needling;Ultrasound   PT Next Visit Plan pain control, progress exercises as tolerated; stabilization exercises   PT Home Exercise Plan home program for stabilization and walking exercises      Patient will benefit from skilled therapeutic intervention in order to improve the following deficits and impairments:  Pain, Impaired flexibility, Difficulty walking, Increased muscle spasms, Decreased strength, Impaired perceived functional ability, Decreased activity tolerance, Decreased range of motion, Decreased endurance  Visit Diagnosis: Muscle weakness (generalized)  Bilateral low back pain without sciatica, unspecified chronicity  Difficulty in walking, not elsewhere classified     Problem List Patient Active Problem List   Diagnosis Date Noted  . S/P lumbar spinal fusion 12/13/2015  . Controlled type 2 diabetes  mellitus without complication (HCC) 05/16/2015  . Thrombocytopenia (HCC) 05/16/2015  . Recurrent major depressive disorder, in full remission (HCC) 05/16/2015  . Essential (primary) hypertension 05/16/2015  . Fatty infiltration of liver 03/15/2015  . Aortic valve stenosis, nonrheumatic 01/18/2015  . Sciatica of right side 01/09/2015  . Type 2 diabetes mellitus (HCC) 11/10/2014  . RLS (restless legs syndrome) 08/23/2014  . Neuritis or radiculitis due to rupture of lumbar intervertebral disc 06/13/2014  . Degeneration of intervertebral disc of lumbar region 06/13/2014  . Arthritis 01/23/2014  . Arthritis of knee, degenerative 11/15/2013  . Depression 09/29/2013  . Insomnia 09/29/2013  . Apnea, sleep 08/29/2013  . Calculus of kidney 08/29/2013  . Abnormal presence of protein in urine 08/29/2013  . Headache, migraine 08/29/2013  . BP (high blood pressure) 08/29/2013  . HLD (hyperlipidemia) 08/29/2013  . Allergic rhinitis 08/29/2013  . Intractable migraine without aura 02/18/2013    Beacher MayBrooks, Izayah Miner PT 08/01/2016, 1:30 PM  [place] [facility] [address] [place], [place], [address] Phone: [phone]   Fax:  [phone]  Name: Jessica Cole MRN: [id number] Date of Birth: 26-Aug-1950

## 2016-08-05 ENCOUNTER — Encounter: Payer: Self-pay | Admitting: Physical Therapy

## 2016-08-05 ENCOUNTER — Ambulatory Visit: Payer: Medicare HMO | Attending: Neurological Surgery | Admitting: Physical Therapy

## 2016-08-05 DIAGNOSIS — R262 Difficulty in walking, not elsewhere classified: Secondary | ICD-10-CM | POA: Diagnosis present

## 2016-08-05 DIAGNOSIS — M545 Low back pain, unspecified: Secondary | ICD-10-CM

## 2016-08-05 DIAGNOSIS — M6281 Muscle weakness (generalized): Secondary | ICD-10-CM | POA: Diagnosis present

## 2016-08-05 NOTE — Therapy (Signed)
[facility] PHYSICAL AND SPORTS MEDICINE [address], [place], [address] Phone: [phone]   Fax:  [phone]  Physical Therapy Treatment  Patient Details  Name: Jessica Cole MRN: [id number] Date of Birth: 08/13/1950 Referring Provider: Tia Alert MD  Encounter Date: 08/05/2016      PT End of Session - 08/05/16 0911    Visit Number 8   Number of Visits 12   Date for PT Re-Evaluation 08/06/16   Authorization Type 8   Authorization Time Period 10 G code   PT Start Time 0906   PT Stop Time 0946   PT Time Calculation (min) 40 min   Activity Tolerance Patient tolerated treatment well;Patient limited by pain   Behavior During Therapy [facility] for tasks assessed/performed      Past Medical History:  Diagnosis Date  . Anxiety   . Colon polyps   . Degenerative arthritis   . Depression   . Diabetes mellitus without complication (HCC)    Type II  . Environmental allergies   . Family history of adverse reaction to anesthesia    sister- PONV  . Fatty liver   . Glaucoma   . Headache(784.0)    HX  MIGRAINES  . History of bronchitis   . History of kidney stones   . History of pneumonia   . Hypertension   . Hypothyroidism   . Obesity   . OSA on CPAP   . Plantar fasciitis    right  . PONV (postoperative nausea and vomiting)   . Renal calculi   . Renal calculi   . RLS (restless legs syndrome) 08/23/2014    Past Surgical History:  Procedure Laterality Date  . ABDOMINAL HYSTERECTOMY     partial  . APPENDECTOMY    . Arthroscopic surgery, knee Left   . BREAST BIOPSY Right    several  . BUNIONECTOMY     LEFT   . COLONOSCOPY W/ POLYPECTOMY    . FOOT SURGERY     RIGHT    . KNEE ARTHROSCOPY W/ MENISCAL REPAIR Left   . LASIK    . MAXIMUM ACCESS (MAS)POSTERIOR LUMBAR INTERBODY FUSION (PLIF) 1 LEVEL N/A 04/25/2016   Procedure: LUMBAR FOUR-FIVE  MAXIMUM ACCESS (MAS) POSTERIOR LUMBAR INTERBODY FUSION (PLIF) with extension of  instrumentation LUMBAR TWO-FIVE;  Surgeon: Tia Alert, MD;  Location: [facility] OR;  Service: Neurosurgery;  Laterality: N/A;  . MAXIMUM ACCESS (MAS)POSTERIOR LUMBAR INTERBODY FUSION (PLIF) 2 LEVEL N/A 12/13/2015   Procedure: Lumbar two-three - Lumbar three-four MAXIMUM ACCESS (MAS) POSTERIOR LUMBAR INTERBODY FUSION (PLIF)  ;  Surgeon: Tia Alert, MD;  Location: [facility] NEURO ORS;  Service: Neurosurgery;  Laterality: N/A;  . SHOULDER SURGERY     RIGHT   . TONSILLECTOMY      There were no vitals filed for this visit.      Subjective Assessment - 08/05/16 0907    Subjective Patient reports she is sore today and this may be due to cleaning and doing laundry yesterday. She is sore "all over" today and her left hand is hurting today.   Pertinent History Patient reports progressive worsening back and leg pain over the past year and a half. She had surgery 12/13/2015 for fusion L2-3, L3-4 and then increased pain with radiculopathy in left LE with subsequent surgery PLIF L4-5 04/25/2016. Since surgery she has been gradually increasing activity.    Limitations Sitting;Standing;Walking;House hold activities   How long can you sit comfortably? 30 min.  How long can you stand comfortably? 10 min.   How long can you walk comfortably? 30 min.   Patient Stated Goals to return to full activity without difficulty   Currently in Pain? Yes   Pain Score 6    Pain Location Back   Pain Orientation Mid;Lower   Pain Descriptors / Indicators Aching;Sore   Pain Type Chronic pain   Pain Onset More than a month ago   Pain Frequency Intermittent      Objective: Vitals:  BP 130/90  left arm sitting pre treatment;  HR 73bpm  Treatment: Therapeutic exercise: patient performed with tactile, verbal cues and demonstration of therapist Walk in gym x 6 min.: 950 feet with reported increased soreness in lower back Sitting:  Hip adduction with ball with glute sets x 15 reps Seated knee extension 2 x 15 with 3# ankle  weights Knee flexion with red resistive band with assist of therapist 2 x 25 reps Pallof press facing forward, performed in sitting position, with resistive band (red) 2 x 15 reps Scapular rows bilateral with stabilization in siting x 15 reps Straight arm pull downs with red resistive band X 15 reps  Standing:  Standing side stepping along airex balance beam x 2 min. Step ups onto balance beamx 15reps each LE, using UE for support/balance Standing step up march, tap balance stone and return to starting position x 5 reps each LE with demonstration and repetition to perform correctly  Patient response to treatment: Patient demonstrated improved technique with exercises with minimal VC and demonstration for correct technique. Mild pain in lower back with exercises, no worse at end of session. Required close supervision and support of UE's for standing and walking exercise for safety. Mild fatigue noted at end of session.        PT Education - 08/05/16 0909    Education provided Yes   Education Details HEP: continue with exercises as able and modify as needed   Person(s) Educated Patient   Methods Explanation   Comprehension Verbalized understanding             PT Long Term Goals - 07/17/16 0831      PT LONG TERM GOAL #1   Title patient will demonstrate improved function with decreased back pain with MODI score of 20% or less by 08/06/2016   Baseline MODI 34% (moderate self perceived disability) MODI 45% impaired 07/17/2016   Status On-going     PT LONG TERM GOAL #2   Title Patient  will demonstrate good knowledge of posture and body mechanics for back and be able to stand and walk > 1200' by 08/06/2016 demonstrating improved endurance for community activities   Baseline unable to walk even short distances without increased back and left LE pain/spasms; improving 07/17/2016   Status On-going     PT LONG TERM GOAL #3   Title Improve  LEFS score to 40/80 or better  indicating  improved function with daily tasks, improved LE strength by 08/06/2016   Baseline LEFS 29/80:  07/17/16 32/80   Status New     PT LONG TERM GOAL #4   Title Patient will be independent with home exercises without cuing for core control, strengthening to allow self management once discharged from physical therapy 08/06/16   Baseline limited knowledge and maximal VC required for guided exercises and progression    Status On-going               Plan - 08/05/16 1610  Clinical Impression Statement Patient demonstrates improvement in ability to perform exercises with less difficulty and pain today. She requires cuing and guidance to perform with good technique and will benefit form conitnued physical therapy intervention to achieve goals.    Rehab Potential Good   Clinical Impairments Affecting Rehab Potential (+) motivated (-) chronic condition of pain in hip/low back; multiple surgeries on spine within 6 months   PT Frequency 2x / week   PT Duration 6 weeks   PT Treatment/Interventions Manual techniques;Electrical Stimulation;Moist Heat;Patient/family education;Cryotherapy;Therapeutic exercise;Dry needling;Ultrasound   PT Next Visit Plan pain control, progress exercises as tolerated; stabilization exercises; re assess MODI, walking, recertification   PT Home Exercise Plan home program for stabilization and walking exercises      Patient will benefit from skilled therapeutic intervention in order to improve the following deficits and impairments:  Pain, Impaired flexibility, Difficulty walking, Increased muscle spasms, Decreased strength, Impaired perceived functional ability, Decreased activity tolerance, Decreased range of motion, Decreased endurance  Visit Diagnosis: Muscle weakness (generalized)  Bilateral low back pain without sciatica, unspecified chronicity  Difficulty in walking, not elsewhere classified     Problem List Patient Active Problem List   Diagnosis Date Noted   . S/P lumbar spinal fusion 12/13/2015  . Controlled type 2 diabetes mellitus without complication (HCC) 05/16/2015  . Thrombocytopenia (HCC) 05/16/2015  . Recurrent major depressive disorder, in full remission (HCC) 05/16/2015  . Essential (primary) hypertension 05/16/2015  . Fatty infiltration of liver 03/15/2015  . Aortic valve stenosis, nonrheumatic 01/18/2015  . Sciatica of right side 01/09/2015  . Type 2 diabetes mellitus (HCC) 11/10/2014  . RLS (restless legs syndrome) 08/23/2014  . Neuritis or radiculitis due to rupture of lumbar intervertebral disc 06/13/2014  . Degeneration of intervertebral disc of lumbar region 06/13/2014  . Arthritis 01/23/2014  . Arthritis of knee, degenerative 11/15/2013  . Depression 09/29/2013  . Insomnia 09/29/2013  . Apnea, sleep 08/29/2013  . Calculus of kidney 08/29/2013  . Abnormal presence of protein in urine 08/29/2013  . Headache, migraine 08/29/2013  . BP (high blood pressure) 08/29/2013  . HLD (hyperlipidemia) 08/29/2013  . Allergic rhinitis 08/29/2013  . Intractable migraine without aura 02/18/2013    Beacher May PT 08/06/2016, 9:06 AM  [facility] PHYSICAL AND SPORTS MEDICINE [address], [place], [address] Phone: [phone]   Fax:  [phone]  Name: Jessica Cole MRN: [id number] Date of Birth: 1950/06/23

## 2016-08-07 ENCOUNTER — Encounter: Payer: Medicare HMO | Admitting: Physical Therapy

## 2016-08-08 ENCOUNTER — Ambulatory Visit: Payer: Medicare HMO | Admitting: Physical Therapy

## 2016-08-08 ENCOUNTER — Encounter: Payer: Self-pay | Admitting: Physical Therapy

## 2016-08-08 DIAGNOSIS — M6281 Muscle weakness (generalized): Secondary | ICD-10-CM

## 2016-08-08 DIAGNOSIS — M545 Low back pain, unspecified: Secondary | ICD-10-CM

## 2016-08-08 DIAGNOSIS — R262 Difficulty in walking, not elsewhere classified: Secondary | ICD-10-CM

## 2016-08-09 NOTE — Therapy (Signed)
[place] [facility] PHYSICAL [facility] SPORTS MEDICINE [address], [place], [address] Phone: [phone]   Fax:  [phone]  Physical Therapy Treatment  Patient Details  Name: Jessica Cole MRN: [id number] Date of Birth: August 03, 1950 Referring Provider: Tia Alert MD  Encounter Date: 08/08/2016      PT End of Session - 08/08/16 1100    Visit Number 9   Number of Visits 20   Date for PT Re-Evaluation 09/05/16   Authorization Type 9   Authorization Time Period 10 G code   PT Start Time 1055   PT Stop Time 1140   PT Time Calculation (min) 45 min   Activity Tolerance Patient tolerated treatment well;Patient limited by pain   Behavior During Therapy [facility] for tasks assessed/performed      Past Medical History:  Diagnosis Date  . Anxiety   . Colon polyps   . Degenerative arthritis   . Depression   . Diabetes mellitus without complication (HCC)    Type II  . Environmental allergies   . Family history of adverse reaction to anesthesia    sister- PONV  . Fatty liver   . Glaucoma   . Headache(784.0)    HX  MIGRAINES  . History of bronchitis   . History of kidney stones   . History of pneumonia   . Hypertension   . Hypothyroidism   . Obesity   . OSA on CPAP   . Plantar fasciitis    right  . PONV (postoperative nausea and vomiting)   . Renal calculi   . Renal calculi   . RLS (restless legs syndrome) 08/23/2014    Past Surgical History:  Procedure Laterality Date  . ABDOMINAL HYSTERECTOMY     partial  . APPENDECTOMY    . Arthroscopic surgery, knee Left   . BREAST BIOPSY Right    several  . BUNIONECTOMY     LEFT   . COLONOSCOPY W/ POLYPECTOMY    . FOOT SURGERY     RIGHT    . KNEE ARTHROSCOPY W/ MENISCAL REPAIR Left   . LASIK    . MAXIMUM ACCESS (MAS)POSTERIOR LUMBAR INTERBODY FUSION (PLIF) 1 LEVEL N/A 04/25/2016   Procedure: LUMBAR FOUR-FIVE  MAXIMUM ACCESS (MAS) POSTERIOR LUMBAR INTERBODY FUSION (PLIF) with extension of  instrumentation LUMBAR TWO-FIVE;  Surgeon: Tia Alert, MD;  Location: [facility] OR;  Service: Neurosurgery;  Laterality: N/A;  . MAXIMUM ACCESS (MAS)POSTERIOR LUMBAR INTERBODY FUSION (PLIF) 2 LEVEL N/A 12/13/2015   Procedure: Lumbar two-three - Lumbar three-four MAXIMUM ACCESS (MAS) POSTERIOR LUMBAR INTERBODY FUSION (PLIF)  ;  Surgeon: Tia Alert, MD;  Location: [facility] NEURO ORS;  Service: Neurosurgery;  Laterality: N/A;  . SHOULDER SURGERY     RIGHT   . TONSILLECTOMY      There were no vitals filed for this visit.      Subjective Assessment - 08/08/16 1058    Subjective Patient reports she has a new pillow for sleeping and is noticing a difference in her neck and upper back pain which is now decreased. She reports she feels she needs to continue with physical therapy for further guidance, instruction in exercises due to her not feeling confident to continue on own.    Pertinent History Patient reports progressive worsening back and leg pain over the past year and a half. She had surgery 12/13/2015 for fusion L2-3, L3-4 and then increased pain with radiculopathy in left LE with subsequent surgery PLIF L4-5 04/25/2016. Since surgery  she has been gradually increasing activity.    Limitations Sitting;Standing;Walking;House hold activities   How long can you sit comfortably? 30 min.   How long can you stand comfortably? 10 min.   How long can you walk comfortably? 30 min.   Patient Stated Goals to return to full activity without difficulty   Currently in Pain? Other (Comment)  less than a 1/10 today      Objective: Outcome measures; LEFS; 30.5/80 (80 = no self perceived disability) MODI: 38% (0 = no self perceived disability) 6 min. Walk test: 08/05/16: 950 feet with reported increased soreness in lower back  Strength: bilateral LE's with decreased hip flexion 4-/5, extension 4/5, abduction 4-/5, knee extension 4/5, flexion 4/5  Treatment:  Therapeutic exercise: patient performed with tactile,  verbal cues and demonstration of therapist Sitting:  hip adduction with ball with glute sets x 15 reps Seated knee extension 1 x 15 with 3# ankle weights Knee flexion with red resistive band with assist of therapist 1 x 15 reps At [employer]ME[employer] cable machine: Pallof press facing forward, performed in standing position, 10# x 15 reps Scapular rows bilateral with stabilization in siting 10# 2 x 15 reps Reverse chin ups 15# x 15 reps Standing Straight arm pull downs with 15# X 15 reps Standing:  Standing side stepping along airex balance beam x 2 min. Step ups onto balance beamx 15reps each LE, using UE for support/balance Standing step up march, tap balance stone and return to starting position x 5 reps each LE with demonstration and repetition to perform correctly  Patient response to treatment: Patient demonstrated improved technique with exercises with minimal VC and demonstration for correct technique. patient reported mild pain in lower back with exercises. Patient required close supervision and support of UE's for standing, walking exercises for safety. Mild fatigue noted at end of session.         PT Education - 08/08/16 1100    Education provided Yes   Education Details HEP; concentrated on standing and walking exercises today   Person(s) Educated Patient   Methods Explanation   Comprehension Verbalized understanding             PT Long Term Goals - 08/08/16 1200      PT LONG TERM GOAL #1   Title patient will demonstrate improved function with decreased back pain with MODI score of 20% or less by 09/05/2016   Baseline MODI 34% (moderate self perceived disability) MODI 45% impaired 07/17/2016; 38% 08/08/2016   Status Revised     PT LONG TERM GOAL #2   Title Patient  will demonstrate good knowledge of posture and body mechanics for back and be able to stand and walk > 1200' with 6 min. walk test by 09/05/2016 demonstrating improved endurance for community activities   Baseline  08/05/2016 950' with back pain    Status Revised     PT LONG TERM GOAL #3   Title Improve  LEFS score to 50/80 or better  indicating improved function with daily tasks, improved LE strength by 09/05/2016   Baseline LEFs 30.5/80 08/08/2016   Status Revised     PT LONG TERM GOAL #4   Title Patient will be independent with home exercises without cuing for core control, strengthening to allow self management once discharged from physical therapy 09/05/16   Baseline limited knowledge and moderate VC required for guided exercises and progression    Status Revised  Plan - 08/08/16 1102    Clinical Impression Statement Patient demonstrated improvement with endurance and strength with current treatment. She continues with pain in back and decreased strength and will benefit from additional physical therapy intervention to achieve goals. Her current impairment level is 35% based on MODI , LEFS 30.5/80. she requires guidance to perform exercises and for appropriate progression in order to achieve maximal function with decreased back symptoms and be able to transition to home program.    Rehab Potential Good   Clinical Impairments Affecting Rehab Potential (+) motivated (-) chronic condition of pain in hip/low back; multiple surgeries on spine within 6 months   PT Frequency 2x / week   PT Duration 6 weeks   PT Treatment/Interventions Manual techniques;Electrical Stimulation;Moist Heat;Patient/family education;Cryotherapy;Therapeutic exercise;Dry needling;Ultrasound   PT Next Visit Plan pain control, progress exercises as tolerated; stabilization exercises   PT Home Exercise Plan home program for stabilization and walking exercises   Consulted and Agree with Plan of Care Patient      Patient will benefit from skilled therapeutic intervention in order to improve the following deficits and impairments:  Pain, Impaired flexibility, Difficulty walking, Increased muscle spasms, Decreased  strength, Impaired perceived functional ability, Decreased activity tolerance, Decreased range of motion, Decreased endurance  Visit Diagnosis: Muscle weakness (generalized) - Plan: PT plan of care cert/re-cert  Bilateral low back pain without sciatica, unspecified chronicity - Plan: PT plan of care cert/re-cert  Difficulty in walking, not elsewhere classified - Plan: PT plan of care cert/re-cert     Problem List Patient Active Problem List   Diagnosis Date Noted  . S/P lumbar spinal fusion 12/13/2015  . Controlled type 2 diabetes mellitus without complication (HCC) 05/16/2015  . Thrombocytopenia (HCC) 05/16/2015  . Recurrent major depressive disorder, in full remission (HCC) 05/16/2015  . Essential (primary) hypertension 05/16/2015  . Fatty infiltration of liver 03/15/2015  . Aortic valve stenosis, nonrheumatic 01/18/2015  . Sciatica of right side 01/09/2015  . Type 2 diabetes mellitus (HCC) 11/10/2014  . RLS (restless legs syndrome) 08/23/2014  . Neuritis or radiculitis due to rupture of lumbar intervertebral disc 06/13/2014  . Degeneration of intervertebral disc of lumbar region 06/13/2014  . Arthritis 01/23/2014  . Arthritis of knee, degenerative 11/15/2013  . Depression 09/29/2013  . Insomnia 09/29/2013  . Apnea, sleep 08/29/2013  . Calculus of kidney 08/29/2013  . Abnormal presence of protein in urine 08/29/2013  . Headache, migraine 08/29/2013  . BP (high blood pressure) 08/29/2013  . HLD (hyperlipidemia) 08/29/2013  . Allergic rhinitis 08/29/2013  . Intractable migraine without aura 02/18/2013    Beacher May PT 08/09/2016, 2:51 PM  [place] [facility] PHYSICAL AND SPORTS MEDICINE [address], [place], [address] Phone: [phone]   Fax:  [phone]  Name: Jessica Cole MRN: [id number] Date of Birth: 11/28/1950

## 2016-08-12 ENCOUNTER — Encounter: Payer: Self-pay | Admitting: Physical Therapy

## 2016-08-12 ENCOUNTER — Ambulatory Visit: Payer: Medicare HMO | Admitting: Physical Therapy

## 2016-08-12 DIAGNOSIS — M6281 Muscle weakness (generalized): Secondary | ICD-10-CM

## 2016-08-12 DIAGNOSIS — R262 Difficulty in walking, not elsewhere classified: Secondary | ICD-10-CM

## 2016-08-12 DIAGNOSIS — M545 Low back pain, unspecified: Secondary | ICD-10-CM

## 2016-08-12 NOTE — Therapy (Signed)
[place] [facility] [address], [place], [address] Phone: [phone]   Fax:  [phone]  Physical Therapy Treatment/Progress report  Patient Details  Name: Jessica Cole MRN: [id number] Date of Birth: Sep 21, 1950 Referring Provider: Tia Alert MD  Encounter Date: 08/12/2016      PT End of Session - 08/12/16 1039    Visit Number 10   Number of Visits 12   Date for PT Re-Evaluation 09/05/16   Authorization Type 10   Authorization Time Period 10 G code   PT Start Time 1031   PT Stop Time 1115   PT Time Calculation (min) 44 min   Activity Tolerance Patient tolerated treatment well;Patient limited by pain   Behavior During Therapy [facility] for tasks assessed/performed      Past Medical History:  Diagnosis Date  . Anxiety   . Colon polyps   . Degenerative arthritis   . Depression   . Diabetes mellitus without complication (HCC)    Type II  . Environmental allergies   . Family history of adverse reaction to anesthesia    sister- PONV  . Fatty liver   . Glaucoma   . Headache(784.0)    HX  MIGRAINES  . History of bronchitis   . History of kidney stones   . History of pneumonia   . Hypertension   . Hypothyroidism   . Obesity   . OSA on CPAP   . Plantar fasciitis    right  . PONV (postoperative nausea and vomiting)   . Renal calculi   . Renal calculi   . RLS (restless legs syndrome) 08/23/2014    Past Surgical History:  Procedure Laterality Date  . ABDOMINAL HYSTERECTOMY     partial  . APPENDECTOMY    . Arthroscopic surgery, knee Left   . BREAST BIOPSY Right    several  . BUNIONECTOMY     LEFT   . COLONOSCOPY W/ POLYPECTOMY    . FOOT SURGERY     RIGHT    . KNEE ARTHROSCOPY W/ MENISCAL REPAIR Left   . LASIK    . MAXIMUM ACCESS (MAS)POSTERIOR LUMBAR INTERBODY FUSION (PLIF) 1 LEVEL N/A 04/25/2016   Procedure: LUMBAR FOUR-FIVE  MAXIMUM ACCESS (MAS) POSTERIOR LUMBAR INTERBODY FUSION (PLIF)  with extension of instrumentation LUMBAR TWO-FIVE;  Surgeon: Tia Alert, MD;  Location: [facility] OR;  Service: Neurosurgery;  Laterality: N/A;  . MAXIMUM ACCESS (MAS)POSTERIOR LUMBAR INTERBODY FUSION (PLIF) 2 LEVEL N/A 12/13/2015   Procedure: Lumbar two-three - Lumbar three-four MAXIMUM ACCESS (MAS) POSTERIOR LUMBAR INTERBODY FUSION (PLIF)  ;  Surgeon: Tia Alert, MD;  Location: [facility] NEURO ORS;  Service: Neurosurgery;  Laterality: N/A;  . SHOULDER SURGERY     RIGHT   . TONSILLECTOMY      There were no vitals filed for this visit.      Subjective Assessment - 08/12/16 1035    Subjective Patient reports she is still doing well with new pillow for sleeping. Today she is reporting increased stressors in personal life. She is feeling stiffness in her back following prolonged sitting.    Pertinent History Patient reports progressive worsening back and leg pain over the past year and a half. She had surgery 12/13/2015 for fusion L2-3, L3-4 and then increased pain with radiculopathy in left LE with subsequent surgery PLIF L4-5 04/25/2016. Since surgery she has been gradually increasing activity.    Limitations Sitting;Standing;Walking;House hold activities   How long can you sit  comfortably? 30 min.   How long can you stand comfortably? 10 min.   How long can you walk comfortably? 30 min.   Patient Stated Goals to return to full activity without difficulty   Currently in Pain? Other (Comment)  1/10 and described as stiffness     Objective: Outcome measures; taken on 08/08/2016 LEFS; 30.5/80 (80 = no self perceived disability) MODI: 38% (0 = no self perceived disability) 6 min. Walk test: 08/05/16: 950 feet with reported increased soreness in lower back  Strength: bilateral LE's with decreased hip flexion 4-/5, extension 4/5, abduction 4-/5, knee extension 4/5, flexion 4/5  Treatment:  Therapeutic exercise: patient performed with tactile, verbal cues and demonstration of therapist Sitting:  hip  adduction with ball with glute sets x 15 reps Seated knee extension 1 x 15 with 4# ankle weights Knee flexion with red resistive band with assist of therapist 1 x 15 reps At OMEGA cable machine: Pallof press facing forward, performed in standing position, 10# x 15 reps Scapular rows bilateral with stabilization in siting 10# 2 x 15 reps Reverse chin ups 20#  2 x 15 reps Standing Straight arm pull downs with 15# X 15 reps Standing:  Standing side stepping along airex balance beam x 2 min. Step ups onto balance beamx 15reps each LE, using UE for support/balance Standing step up march, tap balance stone and return to starting position x 5 reps each LE with demonstration and repetition to perform correctly  Patient response to treatment: Patient improved technique and alignment for core exercises with minimal VC and demonstration of therapist. Mild pain reported in lower back with exercises. Patient required close supervision and used support of UE's for standing and walking exercises for safety. Mild fatigue noted at end of session.          PT Education - 08/12/16 1038    Education provided Yes   Education Details HEP: re assessed standing, walking and strengthening for core, discussed energy conservation for performing activities at home   Person(s) Educated Patient   Methods Explanation   Comprehension Verbalized understanding             PT Long Term Goals - 08/08/16 1200      PT LONG TERM GOAL #1   Title patient will demonstrate improved function with decreased back pain with MODI score of 20% or less by 09/05/2016   Baseline MODI 34% (moderate self perceived disability) MODI 45% impaired 07/17/2016; 38% 08/08/2016   Status Revised     PT LONG TERM GOAL #2   Title Patient  will demonstrate good knowledge of posture and body mechanics for back and be able to stand and walk > 1200' with 6 min. walk test by 09/05/2016 demonstrating improved endurance for community activities    Baseline 08/05/2016 950' with back pain    Status Revised     PT LONG TERM GOAL #3   Title Improve  LEFS score to 50/80 or better  indicating improved function with daily tasks, improved LE strength by 09/05/2016   Baseline LEFs 30.5/80 08/08/2016   Status Revised     PT LONG TERM GOAL #4   Title Patient will be independent with home exercises without cuing for core control, strengthening to allow self management once discharged from physical therapy 09/05/16   Baseline limited knowledge and moderate VC required for guided exercises and progression    Status Revised               Plan -  Aug 14, 2016 1040    Clinical Impression Statement Patient demonstrates improvment with strength and endurance. She demonstrates steady progression with goals and is improving endurance as demonstrated by 6 min. walk test and abiltiy to perform exercises with minimal rest periods. She should continue to progress towards goals with continued physical therapy intervention.    Rehab Potential Good   Clinical Impairments Affecting Rehab Potential (+) motivated (-) chronic condition of pain in hip/low back; multiple surgeries on spine within 6 months   PT Frequency 2x / week   PT Duration 6 weeks   PT Treatment/Interventions Manual techniques;Electrical Stimulation;Moist Heat;Patient/family education;Cryotherapy;Therapeutic exercise;Dry needling;Ultrasound   PT Next Visit Plan pain control, progress exercises as tolerated; stabilization exercises   PT Home Exercise Plan home program for stabilization and walking exercises      Patient will benefit from skilled therapeutic intervention in order to improve the following deficits and impairments:  Pain, Impaired flexibility, Difficulty walking, Increased muscle spasms, Decreased strength, Impaired perceived functional ability, Decreased activity tolerance, Decreased range of motion, Decreased endurance  Visit Diagnosis: Muscle weakness (generalized)  Bilateral low  back pain without sciatica, unspecified chronicity  Difficulty in walking, not elsewhere classified       G-Codes - 14-Aug-2016 1100    Functional Assessment Tool Used (Outpatient Only) pain scale, clinical judgment, modified oswestry, strength deficits, ROM, LEFS   Functional Limitation Mobility: Walking and moving around   Mobility: Walking and Moving Around Current Status ([id number]) At least 40 percent but less than 60 percent impaired, limited or restricted   Mobility: Walking and Moving Around Goal Status [phone]) At least 20 percent but less than 40 percent impaired, limited or restricted      Problem List Patient Active Problem List   Diagnosis Date Noted  . S/P lumbar spinal fusion 12/13/2015  . Controlled type 2 diabetes mellitus without complication (HCC) 05/16/2015  . Thrombocytopenia (HCC) 05/16/2015  . Recurrent major depressive disorder, in full remission (HCC) 05/16/2015  . Essential (primary) hypertension 05/16/2015  . Fatty infiltration of liver 03/15/2015  . Aortic valve stenosis, nonrheumatic 01/18/2015  . Sciatica of right side 01/09/2015  . Type 2 diabetes mellitus (HCC) 11/10/2014  . RLS (restless legs syndrome) 08/23/2014  . Neuritis or radiculitis due to rupture of lumbar intervertebral disc 06/13/2014  . Degeneration of intervertebral disc of lumbar region 06/13/2014  . Arthritis 01/23/2014  . Arthritis of knee, degenerative 11/15/2013  . Depression 09/29/2013  . Insomnia 09/29/2013  . Apnea, sleep 08/29/2013  . Calculus of kidney 08/29/2013  . Abnormal presence of protein in urine 08/29/2013  . Headache, migraine 08/29/2013  . BP (high blood pressure) 08/29/2013  . HLD (hyperlipidemia) 08/29/2013  . Allergic rhinitis 08/29/2013  . Intractable migraine without aura 02/18/2013    Beacher May PT 08/13/2016, 10:52 AM  [place] [facility] PH[facility] SPORTS MEDICINE [address] [place], [address] Phone: [phone]    Fax:  [phone]  Name: Jessica Cole MRN: [id number] Date of Birth: [birth year]

## 2016-08-14 ENCOUNTER — Encounter: Payer: Self-pay | Admitting: Physical Therapy

## 2016-08-14 ENCOUNTER — Ambulatory Visit: Payer: Medicare HMO | Admitting: Physical Therapy

## 2016-08-14 DIAGNOSIS — R262 Difficulty in walking, not elsewhere classified: Secondary | ICD-10-CM

## 2016-08-14 DIAGNOSIS — M6281 Muscle weakness (generalized): Secondary | ICD-10-CM

## 2016-08-14 DIAGNOSIS — M545 Low back pain, unspecified: Secondary | ICD-10-CM

## 2016-08-14 NOTE — Therapy (Signed)
[place] [facility] [address], [place], [address] Phone: [phone]   Fax:  [phone]  Physical Therapy Treatment  Patient Details  Name: Jessica Cole MRN: [id number] Date of Birth: May 13, 1950 Referring Provider: Tia Alert MD  Encounter Date: 08/14/2016      PT End of Session - 08/14/16 0957    Visit Number 11   Number of Visits 20   Date for PT Re-Evaluation 09/05/16   Authorization Type 11   Authorization Time Period 20 G code   PT Start Time [phone]   PT Stop Time 1026   PT Time Calculation (min) 39 min   Activity Tolerance Patient tolerated treatment well;Patient limited by pain   Behavior During Therapy [facility] for tasks assessed/performed      Past Medical History:  Diagnosis Date  . Anxiety   . Colon polyps   . Degenerative arthritis   . Depression   . Diabetes mellitus without complication (HCC)    Type II  . Environmental allergies   . Family history of adverse reaction to anesthesia    sister- PONV  . Fatty liver   . Glaucoma   . Headache(784.0)    HX  MIGRAINES  . History of bronchitis   . History of kidney stones   . History of pneumonia   . Hypertension   . Hypothyroidism   . Obesity   . OSA on CPAP   . Plantar fasciitis    right  . PONV (postoperative nausea and vomiting)   . Renal calculi   . Renal calculi   . RLS (restless legs syndrome) 08/23/2014    Past Surgical History:  Procedure Laterality Date  . ABDOMINAL HYSTERECTOMY     partial  . APPENDECTOMY    . Arthroscopic surgery, knee Left   . BREAST BIOPSY Right    several  . BUNIONECTOMY     LEFT   . COLONOSCOPY W/ POLYPECTOMY    . FOOT SURGERY     RIGHT    . KNEE ARTHROSCOPY W/ MENISCAL REPAIR Left   . LASIK    . MAXIMUM ACCESS (MAS)POSTERIOR LUMBAR INTERBODY FUSION (PLIF) 1 LEVEL N/A 04/25/2016   Procedure: LUMBAR FOUR-FIVE  MAXIMUM ACCESS (MAS) POSTERIOR LUMBAR INTERBODY FUSION (PLIF) with extension of  instrumentation LUMBAR TWO-FIVE;  Surgeon: Tia Alert, MD;  Location: [facility] OR;  Service: Neurosurgery;  Laterality: N/A;  . MAXIMUM ACCESS (MAS)POSTERIOR LUMBAR INTERBODY FUSION (PLIF) 2 LEVEL N/A 12/13/2015   Procedure: Lumbar two-three - Lumbar three-four MAXIMUM ACCESS (MAS) POSTERIOR LUMBAR INTERBODY FUSION (PLIF)  ;  Surgeon: Tia Alert, MD;  Location: [facility] NEURO ORS;  Service: Neurosurgery;  Laterality: N/A;  . SHOULDER SURGERY     RIGHT   . TONSILLECTOMY      There were no vitals filed for this visit.      Subjective Assessment - 08/14/16 0953    Subjective Patient reports she is stressed with personal life and spending time with sick friends. She reports she is feeling increased stiffness in her back and is not sleeping well for the past couple of nights.    Pertinent History Patient reports progressive worsening back and leg pain over the past year and a half. She had surgery 12/13/2015 for fusion L2-3, L3-4 and then increased pain with radiculopathy in left LE with subsequent surgery PLIF L4-5 04/25/2016. Since surgery she has been gradually increasing activity.    Limitations Sitting;Standing;Walking;House hold activities   How long  can you sit comfortably? 30 min.   How long can you stand comfortably? 10 min.   How long can you walk comfortably? 30 min.   Patient Stated Goals to return to full activity without difficulty   Currently in Pain? Yes   Pain Score 3    Pain Location Back   Pain Orientation Mid;Lower   Pain Descriptors / Indicators Tightness   Pain Type Chronic pain   Pain Onset More than a month ago   Pain Frequency Intermittent       Objective Gait: guarded posture, short step length, decrease hip/knee flexion  Treatment:  Therapeutic exercise: patient performed with tactile, verbal cues and demonstration of therapist Sitting: (with moist heat (unbilled) applied to back during sitting exercises; no adverse reaction noted) Seated knee extension 1x 15 with  3# ankle weights Knee flexion with red resistive band with assist of therapist 1x 20 reps At [employer]ME[employer] cable machine: with close supervision and spotting of weights throughout Pallof press facing forward, performed in standingposition, 10# x 15 reps Scapular rows bilateral with stabilization in siting 10# x 15 reps Reverse chin ups 20#  2 x 15 reps Standing Straight arm pull downs with 15#X 15 reps Standing:  Standing side stepping along airex balance beam x 2 min. Walk along balance stones (6) x 5 sets with using bilateral UE support for safety and balance Standing step up march, tap balance stone and return to starting position x 15 reps each LE with demonstration and repetition to perform correctly  Patient response to treatment: Patient demonstrated improved technique and alignment with all exercises with minimal verbal cuing/demonstration and required close supervision for safety with all standing exercises due to patient intermittently reporting back and LE pain. no worse at end of session.         PT Education - 08/14/16 0956    Education provided Yes   Education Details HEP: continue with exercises and modifiying activities to decrease strain on back/LE's as able   Person(s) Educated Patient   Methods Explanation   Comprehension Verbalized understanding             PT Long Term Goals - 08/08/16 1200      PT LONG TERM GOAL #1   Title patient will demonstrate improved function with decreased back pain with MODI score of 20% or less by 09/05/2016   Baseline MODI 34% (moderate self perceived disability) MODI 45% impaired 07/17/2016; 38% 08/08/2016   Status Revised     PT LONG TERM GOAL #2   Title Patient  will demonstrate good knowledge of posture and body mechanics for back and be able to stand and walk > 1200' with 6 min. walk test by 09/05/2016 demonstrating improved endurance for community activities   Baseline 08/05/2016 950' with back pain    Status Revised     PT  LONG TERM GOAL #3   Title Improve  LEFS score to 50/80 or better  indicating improved function with daily tasks, improved LE strength by 09/05/2016   Baseline LEFs 30.5/80 08/08/2016   Status Revised     PT LONG TERM GOAL #4   Title Patient will be independent with home exercises without cuing for core control, strengthening to allow self management once discharged from physical therapy 09/05/16   Baseline limited knowledge and moderate VC required for guided exercises and progression    Status Revised               Plan - 08/14/16 1610  Clinical Impression Statement Patient demonstrates increased stiffness and pain in back today therefore focused on pain control and monitored exercises closely with close supervision and assistance as needed. She continues with weakness and pain that requires additional physical therapy intervention to improve function and achieve goals.    Rehab Potential Good   Clinical Impairments Affecting Rehab Potential (+) motivated (-) chronic condition of pain in hip/low back; multiple surgeries on spine within 6 months   PT Frequency 2x / week   PT Duration 6 weeks   PT Treatment/Interventions Manual techniques;Electrical Stimulation;Moist Heat;Patient/family education;Cryotherapy;Therapeutic exercise;Dry needling;Ultrasound   PT Next Visit Plan pain control, progress exercises as tolerated; stabilization exercises   PT Home Exercise Plan home program for stabilization and walking exercises      Patient will benefit from skilled therapeutic intervention in order to improve the following deficits and impairments:  Pain, Impaired flexibility, Difficulty walking, Increased muscle spasms, Decreased strength, Impaired perceived functional ability, Decreased activity tolerance, Decreased range of motion, Decreased endurance  Visit Diagnosis: Muscle weakness (generalized)  Bilateral low back pain without sciatica, unspecified chronicity  Difficulty in walking,  not elsewhere classified     Problem List Patient Active Problem List   Diagnosis Date Noted  . S/P lumbar spinal fusion 12/13/2015  . Controlled type 2 diabetes mellitus without complication (HCC) 05/16/2015  . Thrombocytopenia (HCC) 05/16/2015  . Recurrent major depressive disorder, in full remission (HCC) 05/16/2015  . Essential (primary) hypertension 05/16/2015  . Fatty infiltration of liver 03/15/2015  . Aortic valve stenosis, nonrheumatic 01/18/2015  . Sciatica of right side 01/09/2015  . Type 2 diabetes mellitus (HCC) 11/10/2014  . RLS (restless legs syndrome) 08/23/2014  . Neuritis or radiculitis due to rupture of lumbar intervertebral disc 06/13/2014  . Degeneration of intervertebral disc of lumbar region 06/13/2014  . Arthritis 01/23/2014  . Arthritis of knee, degenerative 11/15/2013  . Depression 09/29/2013  . Insomnia 09/29/2013  . Apnea, sleep 08/29/2013  . Calculus of kidney 08/29/2013  . Abnormal presence of protein in urine 08/29/2013  . Headache, migraine 08/29/2013  . BP (high blood pressure) 08/29/2013  . HLD (hyperlipidemia) 08/29/2013  . Allergic rhinitis 08/29/2013  . Intractable migraine without aura 02/18/2013    Beacher May PT 08/15/2016, 3:05 PM  [place] [facility] PHYS[facility] AND SPORTS MEDICINE [address], [place], [address] Phone: [phone]   Fax:  [phone]  Name: Jessica Cole MRN: [id number] Date of Birth: January 22, 1951

## 2016-08-18 ENCOUNTER — Encounter: Payer: Self-pay | Admitting: Physical Therapy

## 2016-08-18 ENCOUNTER — Ambulatory Visit: Payer: Medicare HMO | Admitting: Physical Therapy

## 2016-08-18 ENCOUNTER — Emergency Department: Payer: Medicare HMO

## 2016-08-18 ENCOUNTER — Emergency Department
Admission: EM | Admit: 2016-08-18 | Discharge: 2016-08-18 | Disposition: A | Discharge status: Home / Self Care | Payer: Medicare HMO | Attending: Emergency Medicine | Admitting: Emergency Medicine

## 2016-08-18 ENCOUNTER — Encounter: Payer: Self-pay | Admitting: *Deleted

## 2016-08-18 DIAGNOSIS — M6281 Muscle weakness (generalized): Secondary | ICD-10-CM

## 2016-08-18 DIAGNOSIS — I10 Essential (primary) hypertension: Secondary | ICD-10-CM | POA: Diagnosis not present

## 2016-08-18 DIAGNOSIS — Z7984 Long term (current) use of oral hypoglycemic drugs: Secondary | ICD-10-CM | POA: Insufficient documentation

## 2016-08-18 DIAGNOSIS — R0789 Other chest pain: Secondary | ICD-10-CM | POA: Insufficient documentation

## 2016-08-18 DIAGNOSIS — E119 Type 2 diabetes mellitus without complications: Secondary | ICD-10-CM | POA: Insufficient documentation

## 2016-08-18 DIAGNOSIS — Z79899 Other long term (current) drug therapy: Secondary | ICD-10-CM | POA: Insufficient documentation

## 2016-08-18 DIAGNOSIS — M545 Low back pain, unspecified: Secondary | ICD-10-CM

## 2016-08-18 DIAGNOSIS — R262 Difficulty in walking, not elsewhere classified: Secondary | ICD-10-CM

## 2016-08-18 DIAGNOSIS — E039 Hypothyroidism, unspecified: Secondary | ICD-10-CM | POA: Insufficient documentation

## 2016-08-18 LAB — CBC
HCT: 42.4 % (ref 35.0–47.0)
HEMOGLOBIN: 14.2 g/dL (ref 12.0–16.0)
MCH: 30 pg (ref 26.0–34.0)
MCHC: 33.6 g/dL (ref 32.0–36.0)
MCV: 89.4 fL (ref 80.0–100.0)
PLATELETS: 151 10*3/uL (ref 150–440)
RBC: 4.74 MIL/uL (ref 3.80–5.20)
RDW: 14 % (ref 11.5–14.5)
WBC: 4.6 10*3/uL (ref 3.6–11.0)

## 2016-08-18 LAB — TROPONIN I: Troponin I: <0.03 ng/mL (ref <0.03)

## 2016-08-18 LAB — BASIC METABOLIC PANEL
ANION GAP: 8 (ref 5–15)
BUN: 16 mg/dL (ref 6–20)
CHLORIDE: 105 mmol/L (ref 101–111)
CO2: 26 mmol/L (ref 22–32)
CREATININE: 0.58 mg/dL (ref 0.44–1.00)
Calcium: 9.7 mg/dL (ref 8.9–10.3)
GFR calc non Af Amer: >60 mL/min (ref >60)
Glucose, Bld: 149 mg/dL — ABNORMAL HIGH (ref 65–99)
Potassium: 3.9 mmol/L (ref 3.5–5.1)
SODIUM: 139 mmol/L (ref 135–145)

## 2016-08-18 MED ORDER — IOPAMIDOL (ISOVUE-370) INJECTION 76%
75.0000 mL | Freq: Once | INTRAVENOUS | Status: AC | PRN
  Administered 2016-08-18: 75 mL via INTRAVENOUS

## 2016-08-18 MED ORDER — ASPIRIN 81 MG PO CHEW
324.0000 mg | CHEWABLE_TABLET | Freq: Once | ORAL | Status: AC
  Administered 2016-08-18: 324 mg via ORAL
  Filled 2016-08-18: qty 4

## 2016-08-18 MED ORDER — NITROGLYCERIN 0.4 MG SL SUBL
0.4000 mg | SUBLINGUAL_TABLET | SUBLINGUAL | Status: DC | PRN
  Administered 2016-08-18: 0.4 mg via SUBLINGUAL
  Filled 2016-08-18: qty 1

## 2016-08-18 NOTE — ED Notes (Signed)
Pt c/o HA from nitro, given ice pack and pillow. Lights dimmed for comfort.

## 2016-08-18 NOTE — ED Triage Notes (Signed)
States chest pain, upper right chest that began Saturday morning, states tightness, also states a "red spot" on her right abd that she believes is MRSA, husband states he had trouble waking her up this AM, states he had to move her around to get her awake, pt states "I was reaching out saying help me but I really wasn't"

## 2016-08-18 NOTE — ED Notes (Signed)
Pt taken to CT via stretcher.

## 2016-08-18 NOTE — ED Provider Notes (Signed)
[facility] Emergency Department Provider Note  ____________________________________________  Time seen: Approximately 4:00 PM  I have reviewed the triage vital signs and the nursing notes.   HISTORY  Chief Complaint Chest Pain    HPI Jessica Cole is a 66 y.o. female who complains of intermittent chest pain that started 2 days ago. Last for a few minutes at a time. No aggravating or alleviating factors. Not exertional, not pleuritic. May be slightly improved by walking and moving around. Not associated with shortness of breath diaphoresis vomiting. Nonradiating. She describes it as a vague pain, denies pressure or heaviness. No fevers chills or cough.  She comes to the ED today because this morning around 9:00 AM, her husband was trying to wake her up and found that she was difficult to arouse. She describes the episode as both being aware of where her husband was saying and that he looked afraid, but feeling unable to interact with him. He reports that this is never happened before.  She does report that she's been under a lot of stress recently with multiple sick family members and friends, and this may be related to her chest pain.  She's had 2 back surgeries in the last 4 months.     Past Medical History:  Diagnosis Date  . Anxiety   . Colon polyps   . Degenerative arthritis   . Depression   . Diabetes mellitus without complication (HCC)    Type II  . Environmental allergies   . Family history of adverse reaction to anesthesia    sister- PONV  . Fatty liver   . Glaucoma   . Headache(784.0)    HX  MIGRAINES  . History of bronchitis   . History of kidney stones   . History of pneumonia   . Hypertension   . Hypothyroidism   . Obesity   . OSA on CPAP   . Plantar fasciitis    right  . PONV (postoperative nausea and vomiting)   . Renal calculi   . Renal calculi   . RLS (restless legs syndrome) 08/23/2014     Patient Active Problem List    Diagnosis Date Noted  . S/P lumbar spinal fusion 12/13/2015  . Controlled type 2 diabetes mellitus without complication (HCC) 05/16/2015  . Thrombocytopenia (HCC) 05/16/2015  . Recurrent major depressive disorder, in full remission (HCC) 05/16/2015  . Essential (primary) hypertension 05/16/2015  . Fatty infiltration of liver 03/15/2015  . Aortic valve stenosis, nonrheumatic 01/18/2015  . Sciatica of right side 01/09/2015  . Type 2 diabetes mellitus (HCC) 11/10/2014  . RLS (restless legs syndrome) 08/23/2014  . Neuritis or radiculitis due to rupture of lumbar intervertebral disc 06/13/2014  . Degeneration of intervertebral disc of lumbar region 06/13/2014  . Arthritis 01/23/2014  . Arthritis of knee, degenerative 11/15/2013  . Depression 09/29/2013  . Insomnia 09/29/2013  . Apnea, sleep 08/29/2013  . Calculus of kidney 08/29/2013  . Abnormal presence of protein in urine 08/29/2013  . Headache, migraine 08/29/2013  . BP (high blood pressure) 08/29/2013  . HLD (hyperlipidemia) 08/29/2013  . Allergic rhinitis 08/29/2013  . Intractable migraine without aura 02/18/2013     Past Surgical History:  Procedure Laterality Date  . ABDOMINAL HYSTERECTOMY     partial  . APPENDECTOMY    . Arthroscopic surgery, knee Left   . BREAST BIOPSY Right    several  . BUNIONECTOMY     LEFT   . COLONOSCOPY W/ POLYPECTOMY    .  FOOT SURGERY     RIGHT    . KNEE ARTHROSCOPY W/ MENISCAL REPAIR Left   . LASIK    . MAXIMUM ACCESS (MAS)POSTERIOR LUMBAR INTERBODY FUSION (PLIF) 1 LEVEL N/A 04/25/2016   Procedure: LUMBAR FOUR-FIVE  MAXIMUM ACCESS (MAS) POSTERIOR LUMBAR INTERBODY FUSION (PLIF) with extension of instrumentation LUMBAR TWO-FIVE;  Surgeon: Tia Alert, MD;  Location: [facility] OR;  Service: Neurosurgery;  Laterality: N/A;  . MAXIMUM ACCESS (MAS)POSTERIOR LUMBAR INTERBODY FUSION (PLIF) 2 LEVEL N/A 12/13/2015   Procedure: Lumbar two-three - Lumbar three-four MAXIMUM ACCESS (MAS) POSTERIOR LUMBAR  INTERBODY FUSION (PLIF)  ;  Surgeon: Tia Alert, MD;  Location: [facility] NEURO ORS;  Service: Neurosurgery;  Laterality: N/A;  . SHOULDER SURGERY     RIGHT   . TONSILLECTOMY       Prior to Admission medications   Medication Sig Start Date End Date Taking? Authorizing Provider  azelastine (ASTELIN) 0.1 % nasal spray Place 1 spray into both nostrils 2 (two) times daily.  11/23/15   Historical Provider, MD  B Complex Vitamins (VITAMIN B COMPLEX PO) Take 1 tablet by mouth daily.     Historical Provider, MD  Coenzyme Q10 (CO Q 10) 100 MG CAPS Take 200 mg by mouth daily.     Historical Provider, MD  diazepam (VALIUM) 5 MG tablet Take 1 tablet (5 mg total) by mouth at bedtime. 06/18/16   York Spaniel, MD  diclofenac sodium (VOLTAREN) 1 % GEL Apply 4 g topically 2 (two) times daily. Patient taking differently: Apply 4 g topically daily as needed (for pain).  08/28/14   Max T Hyatt, DPM  DULoxetine (CYMBALTA) 30 MG capsule Take 90 mg by mouth daily.     Historical Provider, MD  enalapril (VASOTEC) 10 MG tablet Take 10 mg by mouth daily.    Historical Provider, MD  estradiol (VIVELLE-DOT) 0.05 MG/24HR patch Place 1 patch onto the skin 2 (two) times a week.  11/07/15   Historical Provider, MD  fexofenadine (ALLER-EASE) 180 MG tablet Take 180 mg by mouth daily.    Historical Provider, MD  fluticasone (FLONASE) 50 MCG/ACT nasal spray Place 2 sprays into both nostrils 2 (two) times daily.  04/12/14   Historical Provider, MD  gabapentin (NEURONTIN) 800 MG tablet Take 1 tablet (800 mg total) by mouth 2 (two) times daily. 01/22/16   Butch Penny, NP  Genistein (I-COOL FOR MENOPAUSE) 30 MG TABS Take 30 mg by mouth every morning.    Historical Provider, MD  latanoprost (XALATAN) 0.005 % ophthalmic solution Place 1 drop into both eyes at bedtime.     Historical Provider, MD  levothyroxine (SYNTHROID, LEVOTHROID) 112 MCG tablet Take 112 mcg by mouth daily before breakfast.  11/07/15   Historical Provider, MD  losartan  (COZAAR) 50 MG tablet Take 50 mg by mouth every evening.     Historical Provider, MD  metFORMIN (GLUCOPHAGE) 500 MG tablet Take 500 mg by mouth 2 (two) times daily with a meal.     Historical Provider, MD  methocarbamol (ROBAXIN) 500 MG tablet Take 1 tablet (500 mg total) by mouth every 6 (six) hours as needed for muscle spasms. 04/27/16   Tia Alert, MD  metoprolol succinate (TOPROL-XL) 25 MG 24 hr tablet TAKE ONE (1) TABLET BY MOUTH EVERY DAY Patient not taking: Reported on 07/21/2016 08/30/15   York Spaniel, MD  nabumetone (RELAFEN) 500 MG tablet Take 500 mg by mouth 2 (two) times daily.    Historical Provider, MD  nabumetone (RELAFEN) 500 MG tablet Take 500 mg by mouth 2 (two) times daily.    Historical Provider, MD  Omega-3 Fatty Acids (FISH OIL ULTRA) 1000 MG CAPS Take 1,000 mg by mouth 2 (two) times daily.     Historical Provider, MD  oxybutynin (DITROPAN) 5 MG tablet Take 5 mg by mouth 2 (two) times daily.    Historical Provider, MD  Oxycodone HCl 10 MG TABS Take 1 tablet (10 mg total) by mouth every 4 (four) hours as needed (for pain). Patient not taking: Reported on 07/21/2016 04/27/16   Tia Alert, MD  Red Yeast Rice 600 MG CAPS Take 1,200 mg by mouth 2 (two) times daily.    Historical Provider, MD  rOPINIRole (REQUIP) 4 MG tablet TAKE ONE (1) TABLET BY MOUTH TWO (2) TIMES DAILY 06/23/16   York Spaniel, MD  sulindac (CLINORIL) 200 MG tablet Take 200 mg by mouth 2 (two) times daily. 02/04/16 02/03/17  Historical Provider, MD  traZODone (DESYREL) 100 MG tablet TAKE ONE TO TWO TABLETS BY MOUTH AT BEDTIME FOR SLEEP 08/07/15   York Spaniel, MD     Allergies Dexamethasone   Family History  Problem Relation Age of Onset  . Lung cancer Mother   . Congestive Heart Failure Father   . Depression Sister   . Rheum arthritis Sister   . Headache Maternal Grandfather   . Migraines Maternal Grandfather   . Headache Maternal Uncle   . Diabetes    . Heart disease    . Hypertension     . Breast cancer Neg Hx     Social History Social History  Substance Use Topics  . Smoking status: Never Smoker  . Smokeless tobacco: Never Used  . Alcohol use No    Review of Systems  Constitutional:   No fever or chills.  ENT:   No sore throat. No rhinorrhea. Cardiovascular:   Positive as above chest pain. Respiratory:   No dyspnea or cough. Gastrointestinal:   Negative for abdominal pain, vomiting and diarrhea.  Genitourinary:   Negative for dysuria or difficulty urinating. Musculoskeletal:   Negative for focal pain or swelling Neurological:   Negative for headaches Skin: Patient reports a small lesion on her right lateral chest that was oozing earlier. 10-point ROS otherwise negative.  ____________________________________________   PHYSICAL EXAM:  VITAL SIGNS: ED Triage Vitals  Enc Vitals Group     BP 08/18/16 1224 (!) 162/85     Pulse Rate 08/18/16 1224 65     Resp 08/18/16 1224 18     Temp 08/18/16 1224 98.2 F (36.8 C)     Temp Source 08/18/16 1224 Oral     SpO2 08/18/16 1224 98 %     Weight 08/18/16 1224 200 lb (90.7 kg)     Height 08/18/16 1224  (1.549 m)     Head Circumference --      Peak Flow --      Pain Score 08/18/16 1223 2     Pain Loc --      Pain Edu? --      Excl. in GC? --     Vital signs reviewed, nursing assessments reviewed.   Constitutional:   Alert and oriented. Well appearing and in no distress. Eyes:   No scleral icterus. No conjunctival pallor. PERRL. EOMI.  No nystagmus. ENT   Head:   Normocephalic and atraumatic.   Nose:   No congestion/rhinnorhea. No septal hematoma   Mouth/Throat:   MMM, no  pharyngeal erythema. No peritonsillar mass.    Neck:   No stridor. No SubQ emphysema. No meningismus. Hematological/Lymphatic/Immunilogical:   No cervical lymphadenopathy. Cardiovascular:   RRR. Symmetric bilateral radial and DP pulses.  No murmurs.  Respiratory:   Normal respiratory effort without tachypnea nor  retractions. Breath sounds are clear and equal bilaterally. No wheezes/rales/rhonchi. Chest wall nontender Gastrointestinal:   Soft and nontender. Non distended. There is no CVA tenderness.  No rebound, rigidity, or guarding. Genitourinary:   deferred Musculoskeletal:   Normal range of motion in all extremities. No joint effusions.  No lower extremity tenderness.  No edema. Neurologic:   Normal speech and language.  CN 2-10 normal. Motor grossly intact. No gross focal neurologic deficits are appreciated.  Skin:    Skin is warm, dry . There is a 1 cm linear superficial narrow abrasion over the right lateral chest in the indicated area of concern for her. There are no inflammatory changes. No warmth induration or drainage. No fluctuance. Appears to be a superficial scratching or rubbing abrasion or possibly from a small pustule that ruptured.. No rash noted.  No petechiae, purpura, or bullae.  ____________________________________________    LABS (pertinent positives/negatives) (all labs ordered are listed, but only abnormal results are displayed) Labs Reviewed  BASIC METABOLIC PANEL - Abnormal; Notable for the following:       Result Value   Glucose, Bld 149 (*)    All other components within normal limits  CBC  TROPONIN I  TROPONIN I   ____________________________________________   EKG  Interpreted by me  Date: 08/18/2016  Rate: 66  Rhythm: normal sinus rhythm  QRS Axis: normal  Intervals: normal  ST/T Wave abnormalities: normal  Conduction Disutrbances: none  Narrative Interpretation: unremarkable      ____________________________________________    RADIOLOGY  Dg Chest 2 View  Result Date: 08/18/2016 CLINICAL DATA:  Chest pain. EXAM: CHEST  2 VIEW COMPARISON:  CT 02/20/2016.  Chest x-ray report 02/19/2016. FINDINGS: Mediastinum and hilar structures are normal. Mild cardiomegaly. No evidence of overt congestive heart failure. Low lung volumes. No pleural effusion or  pneumothorax. Prior lumbar spine fusion IMPRESSION: 1. Mild cardiomegaly.  No CHF. 2. Low lung volumes. Electronically Signed   By: Maisie Fus  Register   On: 08/18/2016 13:14   Ct Angio Chest Pe W And/or Wo Contrast  Result Date: 08/18/2016 CLINICAL DATA:  Chest pain, shortness of breath. EXAM: CT ANGIOGRAPHY CHEST WITH CONTRAST TECHNIQUE: Multidetector CT imaging of the chest was performed using the standard protocol during bolus administration of intravenous contrast. Multiplanar CT image reconstructions and MIPs were obtained to evaluate the vascular anatomy. CONTRAST:  75 mL of Isovue 370 intravenously. COMPARISON:  CT scan of February 20, 2016. FINDINGS: Cardiovascular: Satisfactory opacification of the pulmonary arteries to the segmental level. No evidence of pulmonary embolism. Normal heart size. No pericardial effusion. Atherosclerosis of thoracic aorta is noted without aneurysm or dissection. Mediastinum/Nodes: No enlarged mediastinal, hilar, or axillary lymph nodes. Thyroid gland, trachea, and esophagus demonstrate no significant findings. Lungs/Pleura: No pneumothorax or pleural effusion is noted. Stable 5 mm subpleural nodule is seen in superior segment of left lower lobe best seen on image number 44 series 6. Stable 5 mm nodule seen in right middle lobe best seen on image number 49 of series 6. Upper Abdomen: No acute abnormality. Musculoskeletal: No chest wall abnormality. No acute or significant osseous findings. Review of the MIP images confirms the above findings. IMPRESSION: No definite evidence of pulmonary embolus. Aortic atherosclerosis. Stable  5 mm pulmonary nodules are noted bilaterally. No follow-up needed if patient is low-risk (and has no known or suspected primary neoplasm). Non-contrast chest CT can be considered in 12 months if patient is high-risk. This recommendation follows the consensus statement: Guidelines for Management of Incidental Pulmonary Nodules Detected on CT Images: From  the Fleischner Society 2017; Radiology 2017; 284:228-243. Electronically Signed   By: Lupita Raider, M.D.   On: 08/18/2016 16:29    ____________________________________________   PROCEDURES Procedures  ____________________________________________   INITIAL IMPRESSION / ASSESSMENT AND PLAN / ED COURSE  Pertinent labs & imaging results that were available during my care of the patient were reviewed by me and considered in my medical decision making (see chart for details).       Clinical Course as of Aug 18 1724  Mon Aug 18, 2016  1551 Pt presents with atypical chest pain which is not anginal and not cardiac. Has cardiac risk factors, so will check second trop. 2 recent surgeries raises risk of PE so we will get CTA chest. Asa, nitro. If workup negative, pt is suitable for outpt f/u with cardiology.  [PS]  1655 CTA neg. Will f/u repeat trop  [PS]    Clinical Course User Index [PS] Sharman Cheek, MD      ----------------------------------------- 5:26 PM on 08/18/2016 -----------------------------------------  Second troponin negative. Pain resolved. Vital signs stable. We'll discharge home. Patient has seen Dr. Alger Memos for cardiology, but she wishes to see a different person. Recommended she follow up with the cardiologist of her choice in one or 2 days. Her family member sees Dr. Mariah Milling.  Return precautions given.Considering the patient's symptoms, medical history, and physical examination today, I have low suspicion for ACS, PE, TAD, pneumothorax, carditis, mediastinitis, pneumonia, CHF, or sepsis.   ____________________________________________   FINAL CLINICAL IMPRESSION(S) / ED DIAGNOSES  Final diagnoses:  Atypical chest pain      New Prescriptions   No medications on file     Portions of this note were generated with dragon dictation software. Dictation errors may occur despite best attempts at proofreading.    Sharman Cheek, MD 08/18/16  [phone]

## 2016-08-18 NOTE — Discharge Instructions (Signed)
Your EKG, lab tests, and CT scan of the chest today were all unremarkable.  Please follow up with cardiology as soon as possible for further evaluation of your symptoms.

## 2016-08-18 NOTE — ED Notes (Signed)
Pt given [place] sandwich tray and ginger ale, ok per Dr. Scotty Court

## 2016-08-18 NOTE — ED Notes (Signed)
E-signature page would not work on computer so page was printed out and pt signed on paper.

## 2016-08-18 NOTE — ED Notes (Signed)
Called pt relations to see if husband is in lobby.

## 2016-08-18 NOTE — ED Notes (Signed)
Dr. Stafford at bedside.  

## 2016-08-18 NOTE — Therapy (Signed)
[facility] PHYSICAL AND SPORTS MEDICINE [address] [place], [address] Phone: [phone]   Fax:  [phone]  Physical Therapy Treatment  Patient Details  Name: Jessica Cole MRN: [id number] Date of Birth: [birth year] Referring Provider: Tia Alert MD  Encounter Date: 08/18/2016      PT End of Session - 08/18/16 1037    Visit Number 12   Number of Visits 20   Date for PT Re-Evaluation 09/05/16   Authorization Type 12   Authorization Time Period 20 G code   PT Start Time 1032   PT Stop Time 1114   PT Time Calculation (min) 42 min   Activity Tolerance Patient tolerated treatment well;Patient limited by pain   Behavior During Therapy [facility] for tasks assessed/performed      Past Medical History:  Diagnosis Date  . Anxiety   . Colon polyps   . Degenerative arthritis   . Depression   . Diabetes mellitus without complication (HCC)    Type II  . Environmental allergies   . Family history of adverse reaction to anesthesia    sister- PONV  . Fatty liver   . Glaucoma   . Headache(784.0)    HX  MIGRAINES  . History of bronchitis   . History of kidney stones   . History of pneumonia   . Hypertension   . Hypothyroidism   . Obesity   . OSA on CPAP   . Plantar fasciitis    right  . PONV (postoperative nausea and vomiting)   . Renal calculi   . Renal calculi   . RLS (restless legs syndrome) 08/23/2014    Past Surgical History:  Procedure Laterality Date  . ABDOMINAL HYSTERECTOMY     partial  . APPENDECTOMY    . Arthroscopic surgery, knee Left   . BREAST BIOPSY Right    several  . BUNIONECTOMY     LEFT   . COLONOSCOPY W/ POLYPECTOMY    . FOOT SURGERY     RIGHT    . KNEE ARTHROSCOPY W/ MENISCAL REPAIR Left   . LASIK    . MAXIMUM ACCESS (MAS)POSTERIOR LUMBAR INTERBODY FUSION (PLIF) 1 LEVEL N/A 04/25/2016   Procedure: LUMBAR FOUR-FIVE  MAXIMUM ACCESS (MAS) POSTERIOR LUMBAR INTERBODY FUSION (PLIF) with extension of  instrumentation LUMBAR TWO-FIVE;  Surgeon: Tia Alert, MD;  Location: [facility] OR;  Service: Neurosurgery;  Laterality: N/A;  . MAXIMUM ACCESS (MAS)POSTERIOR LUMBAR INTERBODY FUSION (PLIF) 2 LEVEL N/A 12/13/2015   Procedure: Lumbar two-three - Lumbar three-four MAXIMUM ACCESS (MAS) POSTERIOR LUMBAR INTERBODY FUSION (PLIF)  ;  Surgeon: Tia Alert, MD;  Location: [facility] NEURO ORS;  Service: Neurosurgery;  Laterality: N/A;  . SHOULDER SURGERY     RIGHT   . TONSILLECTOMY      There were no vitals filed for this visit.      Subjective Assessment - 08/18/16 1033    Subjective Patient reports she has been having strange feelings in chest, sharp intermittent pains that resolved on own (currently denies chest pain) and then had an occurrence of being unable to wake up clearly this morning and she reports she took (2) pills to assist with sleeping and these may have contributed to this strange feeling . She is also noticing having sores under her bra  and is going to the physician following therapy today to be checked out. She continues to report increased stress in personal life including visiting a sick friend.   Pertinent History  Patient reports progressive worsening back and leg pain over the past year and a half. She had surgery 12/13/2015 for fusion L2-3, L3-4 and then increased pain with radiculopathy in left LE with subsequent surgery PLIF L4-5 04/25/2016. Since surgery she has been gradually increasing activity.    Limitations Sitting;Standing;Walking;House hold activities   How long can you sit comfortably? 30 min.   How long can you stand comfortably? 10 min.   How long can you walk comfortably? 30 min.   Patient Stated Goals to return to full activity without difficulty   Currently in Pain? Yes   Pain Score 2    Pain Location Back   Pain Orientation Mid;Lower   Pain Descriptors / Indicators Sore;Tightness   Pain Type Chronic pain   Pain Onset More than a month ago   Pain Frequency Intermittent       Objective Vitals; BP pre treatment: 117/80 HR 60bpm Gait: guarded posture, short step length, decrease hip/knee flexion  Treatment:  Therapeutic exercise:patient performed with tactile, verbal cues and demonstration of therapist Seated knee extension 1x 15 with 3# ankle weights Knee flexion with red resistive band with assist of therapist 1x 20 reps Hip flexion 2 x 10 reps each LE Hip abduction with red resistive band x 10 reps with guided ROM  Standing:  Standing side stepping along airex balance beam x 2 min Standing step ups onto airex balance beam 10 reps each LE  Patient response to treatment: Modified exercises to include only LE exercise. Patient without reports of increased back pain during session. She was able to perform all exercises with good technique with minimal VC.        PT Education - 08/18/16 1035    Education provided Yes   Education Details HEP; Continue as tolerated   Person(s) Educated Patient   Methods Explanation   Comprehension Verbalized understanding             PT Long Term Goals - 08/08/16 1200      PT LONG TERM GOAL #1   Title patient will demonstrate improved function with decreased back pain with MODI score of 20% or less by 09/05/2016   Baseline MODI 34% (moderate self perceived disability) MODI 45% impaired 07/17/2016; 38% 08/08/2016   Status Revised     PT LONG TERM GOAL #2   Title Patient  will demonstrate good knowledge of posture and body mechanics for back and be able to stand and walk > 1200' with 6 min. walk test by 09/05/2016 demonstrating improved endurance for community activities   Baseline 08/05/2016 950' with back pain    Status Revised     PT LONG TERM GOAL #3   Title Improve  LEFS score to 50/80 or better  indicating improved function with daily tasks, improved LE strength by 09/05/2016   Baseline LEFs 30.5/80 08/08/2016   Status Revised     PT LONG TERM GOAL #4   Title Patient will be independent with home  exercises without cuing for core control, strengthening to allow self management once discharged from physical therapy 09/05/16   Baseline limited knowledge and moderate VC required for guided exercises and progression    Status Revised               Plan - 08/18/16 1037    Clinical Impression Statement Patient demonstrates increased stiffness in back, no worse during treatment session. She was able to perform all exercises with minimal VC and with good technique. She continues with weakness  and back pain and will benefit from additional physical therapy intervention to achieve goals and be able to transition to independent home program.   Rehab Potential Good   Clinical Impairments Affecting Rehab Potential (+) motivated (-) chronic condition of pain in hip/low back; multiple surgeries on spine within 6 months   PT Frequency 2x / week   PT Duration 6 weeks   PT Treatment/Interventions Manual techniques;Electrical Stimulation;Moist Heat;Patient/family education;Cryotherapy;Therapeutic exercise;Dry needling;Ultrasound   PT Next Visit Plan pain control, progress exercises as tolerated; stabilization exercises   PT Home Exercise Plan home program for stabilization and walking exercises      Patient will benefit from skilled therapeutic intervention in order to improve the following deficits and impairments:  Pain, Impaired flexibility, Difficulty walking, Increased muscle spasms, Decreased strength, Impaired perceived functional ability, Decreased activity tolerance, Decreased range of motion, Decreased endurance  Visit Diagnosis: Muscle weakness (generalized)  Bilateral low back pain without sciatica, unspecified chronicity  Difficulty in walking, not elsewhere classified     Problem List Patient Active Problem List   Diagnosis Date Noted  . S/P lumbar spinal fusion 12/13/2015  . Controlled type 2 diabetes mellitus without complication (HCC) 05/16/2015  . Thrombocytopenia (HCC)  05/16/2015  . Recurrent major depressive disorder, in full remission (HCC) 05/16/2015  . Essential (primary) hypertension 05/16/2015  . Fatty infiltration of liver 03/15/2015  . Aortic valve stenosis, nonrheumatic 01/18/2015  . Sciatica of right side 01/09/2015  . Type 2 diabetes mellitus (HCC) 11/10/2014  . RLS (restless legs syndrome) 08/23/2014  . Neuritis or radiculitis due to rupture of lumbar intervertebral disc 06/13/2014  . Degeneration of intervertebral disc of lumbar region 06/13/2014  . Arthritis 01/23/2014  . Arthritis of knee, degenerative 11/15/2013  . Depression 09/29/2013  . Insomnia 09/29/2013  . Apnea, sleep 08/29/2013  . Calculus of kidney 08/29/2013  . Abnormal presence of protein in urine 08/29/2013  . Headache, migraine 08/29/2013  . BP (high blood pressure) 08/29/2013  . HLD (hyperlipidemia) 08/29/2013  . Allergic rhinitis 08/29/2013  . Intractable migraine without aura 02/18/2013    Beacher May PT 08/19/2016, 11:46 AM  [place] [facility] PHYSICAL AND SPORTS MEDICINE [address], [place], [address] Phone: [phone]   Fax:  [phone]  Name: JONI COLEGROVE MRN: [id number] Date of Birth: October 09, 1950

## 2016-08-20 ENCOUNTER — Ambulatory Visit: Payer: Medicare HMO | Admitting: Physical Therapy

## 2016-08-25 ENCOUNTER — Encounter: Payer: Medicare HMO | Admitting: Physical Therapy

## 2016-08-27 ENCOUNTER — Ambulatory Visit: Payer: Medicare HMO | Admitting: Physical Therapy

## 2016-08-28 ENCOUNTER — Ambulatory Visit: Payer: Medicare HMO | Admitting: Cardiology

## 2016-08-28 ENCOUNTER — Ambulatory Visit: Payer: Medicare HMO | Admitting: Physical Therapy

## 2016-08-29 ENCOUNTER — Encounter: Payer: Self-pay | Admitting: Physical Therapy

## 2016-08-29 ENCOUNTER — Ambulatory Visit: Payer: Medicare HMO | Admitting: Physical Therapy

## 2016-08-29 DIAGNOSIS — R262 Difficulty in walking, not elsewhere classified: Secondary | ICD-10-CM

## 2016-08-29 DIAGNOSIS — M545 Low back pain, unspecified: Secondary | ICD-10-CM

## 2016-08-29 DIAGNOSIS — M6281 Muscle weakness (generalized): Secondary | ICD-10-CM

## 2016-08-30 NOTE — Therapy (Signed)
[place] [facility] [address], [place], [address] Phone: [phone]   Fax:  [phone]  Physical Therapy Treatment  Patient Details  Name: Jessica Cole MRN: [id number] Date of Birth: Sep 15, 1950 Referring Provider: Tia Alert MD  Encounter Date: 08/29/2016      PT End of Session - 08/29/16 0910    Visit Number 13   Number of Visits 20   Date for PT Re-Evaluation 09/05/16   Authorization Type 13   Authorization Time Period 20 G code   PT Start Time 0903   PT Stop Time 0942   PT Time Calculation (min) 39 min   Activity Tolerance Patient tolerated treatment well;Patient limited by pain   Behavior During Therapy [facility] for tasks assessed/performed      Past Medical History:  Diagnosis Date  . Anxiety   . Colon polyps   . Degenerative arthritis   . Depression   . Diabetes mellitus without complication (HCC)    Type II  . Environmental allergies   . Family history of adverse reaction to anesthesia    sister- PONV  . Fatty liver   . Glaucoma   . Headache(784.0)    HX  MIGRAINES  . History of bronchitis   . History of kidney stones   . History of pneumonia   . Hypertension   . Hypothyroidism   . Obesity   . OSA on CPAP   . Plantar fasciitis    right  . PONV (postoperative nausea and vomiting)   . Renal calculi   . Renal calculi   . RLS (restless legs syndrome) 08/23/2014    Past Surgical History:  Procedure Laterality Date  . ABDOMINAL HYSTERECTOMY     partial  . APPENDECTOMY    . Arthroscopic surgery, knee Left   . BREAST BIOPSY Right    several  . BUNIONECTOMY     LEFT   . COLONOSCOPY W/ POLYPECTOMY    . FOOT SURGERY     RIGHT    . KNEE ARTHROSCOPY W/ MENISCAL REPAIR Left   . LASIK    . MAXIMUM ACCESS (MAS)POSTERIOR LUMBAR INTERBODY FUSION (PLIF) 1 LEVEL N/A 04/25/2016   Procedure: LUMBAR FOUR-FIVE  MAXIMUM ACCESS (MAS) POSTERIOR LUMBAR INTERBODY FUSION (PLIF) with extension of  instrumentation LUMBAR TWO-FIVE;  Surgeon: Tia Alert, MD;  Location: [facility] OR;  Service: Neurosurgery;  Laterality: N/A;  . MAXIMUM ACCESS (MAS)POSTERIOR LUMBAR INTERBODY FUSION (PLIF) 2 LEVEL N/A 12/13/2015   Procedure: Lumbar two-three - Lumbar three-four MAXIMUM ACCESS (MAS) POSTERIOR LUMBAR INTERBODY FUSION (PLIF)  ;  Surgeon: Tia Alert, MD;  Location: [facility] NEURO ORS;  Service: Neurosurgery;  Laterality: N/A;  . SHOULDER SURGERY     RIGHT   . TONSILLECTOMY      There were no vitals filed for this visit.      Subjective Assessment - 08/29/16 0903    Subjective Patienit reports she is having increased spasms in back and missed her last appointment due to taking muscle relaxants and sleeping through the day. She does not know for sure what she did and thinks it may be related to reaching overhead standing    Pertinent History Patient reports progressive worsening back and leg pain over the past year and a half. She had surgery 12/13/2015 for fusion L2-3, L3-4 and then increased pain with radiculopathy in left LE with subsequent surgery PLIF L4-5 04/25/2016. Since surgery she has been gradually increasing activity.  Limitations Sitting;Standing;Walking;House hold activities   How long can you sit comfortably? 30 min.   How long can you stand comfortably? 10 min.   How long can you walk comfortably? 30 min.   Patient Stated Goals to return to full activity without difficulty   Currently in Pain? Yes   Pain Score 1    Pain Location Back   Pain Orientation Lower   Pain Descriptors / Indicators Aching;Tightness   Pain Type Chronic pain   Pain Onset More than a month ago   Pain Frequency Constant  since 08/27/16 and improving       Objective Gait: antalgic, decreased trunk rotation, guarded Palpation: increased tenderness and spasms along bilateral lumbar paraspinal muscles  Treatment:  Therapeutic exercise:patient performed with tactile, verbal cues and demonstration of  therapist Seated knee extension 1x 15 with 3# ankle weights Knee flexion with red resistive band with assist of therapist 1x 20reps Hip flexion 2 x 10 reps each LE Hip abduction with red resistive band x 10 reps with guided ROM  Standing:  Standing side stepping along airex balance beam x 2 min Walk along balance stones x 2 min. For balance, strengthening  Modalities: Electrical stimulation: High volt estim.clincial program for muscle spasms  (4) electrodes applied to lumbar spine paraspinal muscles intensity to tolerance with patient seated (in conjunction with exercises) goal: pain, spasms  Patient response to treatment: Patient demonstrated improved technique with exercises with minimal VC for correct alignment. Patient with decreased pain/spasms to mild following estim. Improved motor control with repetition and cuing, following estim.          PT Education - 08/29/16 0916    Education provided Yes   Education Details exercise instruction and home exercises with balance stones   Person(s) Educated Patient   Methods Explanation;Demonstration;Verbal cues   Comprehension Verbalized understanding;Returned demonstration;Verbal cues required             PT Long Term Goals - 08/08/16 1200      PT LONG TERM GOAL #1   Title patient will demonstrate improved function with decreased back pain with MODI score of 20% or less by 09/05/2016   Baseline MODI 34% (moderate self perceived disability) MODI 45% impaired 07/17/2016; 38% 08/08/2016   Status Revised     PT LONG TERM GOAL #2   Title Patient  will demonstrate good knowledge of posture and body mechanics for back and be able to stand and walk > 1200' with 6 min. walk test by 09/05/2016 demonstrating improved endurance for community activities   Baseline 08/05/2016 950' with back pain    Status Revised     PT LONG TERM GOAL #3   Title Improve  LEFS score to 50/80 or better  indicating improved function with daily tasks, improved  LE strength by 09/05/2016   Baseline LEFs 30.5/80 08/08/2016   Status Revised     PT LONG TERM GOAL #4   Title Patient will be independent with home exercises without cuing for core control, strengthening to allow self management once discharged from physical therapy 09/05/16   Baseline limited knowledge and moderate VC required for guided exercises and progression    Status Revised               Plan - 08/29/16 0912    Clinical Impression Statement Patient with increased spasms and able to perform standing exercises following modalities for pain control. She is progressing with exercises as tolerated due to continued pain.    Rehab Potential  Good   Clinical Impairments Affecting Rehab Potential (+) motivated (-) chronic condition of pain in hip/low back; multiple surgeries on spine within 6 months   PT Frequency 2x / week   PT Duration 6 weeks   PT Treatment/Interventions Manual techniques;Electrical Stimulation;Moist Heat;Patient/family education;Cryotherapy;Therapeutic exercise;Dry needling;Ultrasound   PT Next Visit Plan pain control, progress exercises as tolerated; stabilization exercises   PT Home Exercise Plan home program for stabilization and walking exercises      Patient will benefit from skilled therapeutic intervention in order to improve the following deficits and impairments:  Pain, Impaired flexibility, Difficulty walking, Increased muscle spasms, Decreased strength, Impaired perceived functional ability, Decreased activity tolerance, Decreased range of motion, Decreased endurance  Visit Diagnosis: Muscle weakness (generalized)  Bilateral low back pain without sciatica, unspecified chronicity  Difficulty in walking, not elsewhere classified     Problem List Patient Active Problem List   Diagnosis Date Noted  . S/P lumbar spinal fusion 12/13/2015  . Controlled type 2 diabetes mellitus without complication (HCC) 05/16/2015  . Thrombocytopenia (HCC) 05/16/2015   . Recurrent major depressive disorder, in full remission (HCC) 05/16/2015  . Essential (primary) hypertension 05/16/2015  . Fatty infiltration of liver 03/15/2015  . Aortic valve stenosis, nonrheumatic 01/18/2015  . Sciatica of right side 01/09/2015  . Type 2 diabetes mellitus (HCC) 11/10/2014  . RLS (restless legs syndrome) 08/23/2014  . Neuritis or radiculitis due to rupture of lumbar intervertebral disc 06/13/2014  . Degeneration of intervertebral disc of lumbar region 06/13/2014  . Arthritis 01/23/2014  . Arthritis of knee, degenerative 11/15/2013  . Depression 09/29/2013  . Insomnia 09/29/2013  . Apnea, sleep 08/29/2013  . Calculus of kidney 08/29/2013  . Abnormal presence of protein in urine 08/29/2013  . Headache, migraine 08/29/2013  . BP (high blood pressure) 08/29/2013  . HLD (hyperlipidemia) 08/29/2013  . Allergic rhinitis 08/29/2013  . Intractable migraine without aura 02/18/2013    Beacher May PT 08/30/2016, 8:32 AM  [place] [facility] PHYSICAL AND SPORTS MEDICINE [address], [place], [address] Phone: [phone]   Fax:  [phone]  Name: ARIELIS LEONHART MRN: [id number] Date of Birth: 01/05/1951

## 2016-09-03 ENCOUNTER — Encounter: Payer: Self-pay | Admitting: Podiatry

## 2016-09-03 ENCOUNTER — Other Ambulatory Visit: Payer: Self-pay | Admitting: Adult Health

## 2016-09-03 ENCOUNTER — Ambulatory Visit (INDEPENDENT_AMBULATORY_CARE_PROVIDER_SITE_OTHER): Payer: Medicare HMO

## 2016-09-03 ENCOUNTER — Ambulatory Visit (INDEPENDENT_AMBULATORY_CARE_PROVIDER_SITE_OTHER): Payer: Medicare HMO | Admitting: Podiatry

## 2016-09-03 DIAGNOSIS — S93692A Other sprain of left foot, initial encounter: Secondary | ICD-10-CM

## 2016-09-03 DIAGNOSIS — M722 Plantar fascial fibromatosis: Secondary | ICD-10-CM

## 2016-09-04 DIAGNOSIS — M722 Plantar fascial fibromatosis: Secondary | ICD-10-CM

## 2016-09-04 NOTE — Progress Notes (Signed)
She presents today and states that she was just walking on Monday and noticed pain in the anterior shin she relates no injury she noticed significant bruising in the arch and the forefoot. In the lateral ankle swelling. She states that the bruising of the foot does not appear to be normal.  Objective: Vital signs are stable she is alert and oriented 3 I have reviewed her past medical history medications allergies surgery social history and review. Pulses are strongly palpable. Neurologic sensorium is intact. She does have considerable ecchymosis of the plantar aspect of the foot all muscles and tendons appear to be intact and full range of motion. That she does have severe pain on palpation of the plantar fascia just distal to its insertion on the calcaneus. Regular saline today demonstrate no osseous abnormalities. Soft tissue edema is noted on the plantar aspect of the foot.  Assessment: Appears to be a rupture of the plantar fascia with a patient who has a history of plantar fasciitis.  Plan: I placed her in a Cam Walker and I will follow-up with her in about a month.

## 2016-09-05 ENCOUNTER — Encounter: Payer: Self-pay | Admitting: Physical Therapy

## 2016-09-05 ENCOUNTER — Ambulatory Visit: Payer: Medicare HMO | Attending: Neurological Surgery | Admitting: Physical Therapy

## 2016-09-05 DIAGNOSIS — M545 Low back pain, unspecified: Secondary | ICD-10-CM

## 2016-09-05 DIAGNOSIS — R262 Difficulty in walking, not elsewhere classified: Secondary | ICD-10-CM | POA: Insufficient documentation

## 2016-09-05 DIAGNOSIS — M6281 Muscle weakness (generalized): Secondary | ICD-10-CM | POA: Insufficient documentation

## 2016-09-06 NOTE — Therapy (Signed)
[facility] [address] [place], [address] Phone: [phone]   Fax:  [phone]  Physical Therapy Treatment  Patient Details  Name: Jessica Cole MRN: [id number] Date of Birth: 12-08-1950 Referring Provider: Eustace Moore MD  Encounter Date: 09/05/2016      PT End of Session - 09/05/16 0836    Visit Number 14   Number of Visits 20   Date for PT Re-Evaluation 09/05/16   Authorization Type 14   Authorization Time Period 20 G code   PT Start Time 0833   PT Stop Time 0910   PT Time Calculation (min) 37 min   Activity Tolerance Patient tolerated treatment well;Patient limited by pain   Behavior During Therapy [facility] for tasks assessed/performed      Past Medical History:  Diagnosis Date  . Anxiety   . Colon polyps   . Degenerative arthritis   . Depression   . Diabetes mellitus without complication ([place])    Type II  . Environmental allergies   . Family history of adverse reaction to anesthesia    sister- PONV  . Fatty liver   . Glaucoma   . Headache(784.0)    HX  MIGRAINES  . History of bronchitis   . History of kidney stones   . History of pneumonia   . Hypertension   . Hypothyroidism   . Obesity   . OSA on CPAP   . Plantar fasciitis    right  . PONV (postoperative nausea and vomiting)   . Renal calculi   . Renal calculi   . RLS (restless legs syndrome) 08/23/2014    Past Surgical History:  Procedure Laterality Date  . ABDOMINAL HYSTERECTOMY     partial  . APPENDECTOMY    . Arthroscopic surgery, knee Left   . BREAST BIOPSY Right    several  . BUNIONECTOMY     LEFT   . COLONOSCOPY W/ POLYPECTOMY    . FOOT SURGERY     RIGHT    . KNEE ARTHROSCOPY W/ MENISCAL REPAIR Left   . LASIK    . MAXIMUM ACCESS (MAS)POSTERIOR LUMBAR INTERBODY FUSION (PLIF) 1 LEVEL N/A 04/25/2016   Procedure: LUMBAR FOUR-FIVE  MAXIMUM ACCESS (MAS) POSTERIOR LUMBAR INTERBODY FUSION (PLIF) with extension of  instrumentation LUMBAR TWO-FIVE;  Surgeon: Eustace Moore, MD;  Location: [place];  Service: Neurosurgery;  Laterality: N/A;  . MAXIMUM ACCESS (MAS)POSTERIOR LUMBAR INTERBODY FUSION (PLIF) 2 LEVEL N/A 12/13/2015   Procedure: Lumbar two-three - Lumbar three-four MAXIMUM ACCESS (MAS) POSTERIOR LUMBAR INTERBODY FUSION (PLIF)  ;  Surgeon: Eustace Moore, MD;  Location: [facility] NEURO [facility]S;  Service: Neurosurgery;  Laterality: N/A;  . SHOULDER SURGERY     RIGHT   . TONSILLECTOMY      There were no vitals filed for this visit.      Subjective Assessment - 09/05/16 0835    Subjective Patient reports injuring left foot this week and has a tear in her plantar fascia. She is now wearing a  boot. She reports she is having pain and spasms in mid to lower back today intermittently. She states she is returning to surgeon on Monday 5/7 and will discuss further need for therapy at that time.    Pertinent History Patient reports progressive worsening back and leg pain over the past year and a half. She had surgery 12/13/2015 for fusion L2-3, L3-4 and then increased pain with radiculopathy in left LE with subsequent surgery  PLIF L4-5 04/25/2016. Since surgery she has been gradually increasing activity.    Limitations Sitting;Standing;Walking;House hold activities   How long can you sit comfortably? 30 min.   How long can you stand comfortably? 10 min.   How long can you walk comfortably? 30 min.   Patient Stated Goals to return to full activity without difficulty   Currently in Pain? Yes 7/10   Pain Location Back   Pain Orientation Lower   Pain Type Chronic pain   Pain Onset More than a month ago   Pain Frequency Constant      Objective Gait: antalgic, Cam walker on left LE  Palpation: increased tenderness and spasms along bilateral lumbar paraspinal muscles Outcome measures not re assessed; deferred due to patient having new injury to left foot  Treatment:  Therapeutic exercise:patient performed with tactile,  verbal cues and demonstration of therapist Sitting: Core strengthening with UE's: bilateral forward flexion overhead with 4# weight x 15 reps Bilateral scapular retraction with green resistive tubing x 15 reps with assist of therapist and VC for correct technique and alignment Straight arm pull down with green resistive band with assist of therapist x 15 reps  Modalities: Electrical stimulation: High volt estim.clincial program for muscle spasms  (4) electrodes applied to lumbar spine paraspinal muscles intensity to tolerance with patient seated (in conjunction with exercises) goal: pain, spasms  Patient response to treatment: patient demonstrated improved technique with exercises with minimal VC for correct alignment. Patient with decreased pain from  7/10 to 4/10. She continued with intermittent spasms in back throughout exercises.          PT Education - 09/05/16 0900    Education provided Yes   Education Details pain control, exercises to perform while healing from left foot injury   Person(s) Educated Patient   Methods Explanation;Demonstration   Comprehension Verbalized understanding             PT Long Term Goals - 09/05/16 0910      PT LONG TERM GOAL #1   Title patient will demonstrate improved function with decreased back pain with MODI score of 20% or less by 09/05/2016   Baseline MODI 34% (moderate self perceived disability) MODI 45% impaired 07/17/2016; 38% 08/08/2016   Status Not Met     PT LONG TERM GOAL #2   Title Patient  will demonstrate good knowledge of posture and body mechanics for back and be able to stand and walk > 1200' with 6 min. walk test by 09/05/2016 demonstrating improved endurance for community activities   Baseline 08/05/2016 950' with back pain : new injury to left foot prevents re assessment   Status Deferred     PT LONG TERM GOAL #3   Title Improve  LEFS score to 50/80 or better  indicating improved function with daily tasks, improved LE  strength by 09/05/2016   Baseline LEFs 30.5/80 08/08/2016; new injury to left foot prevents re accurate re assessment   Status Not Met     PT LONG TERM GOAL #4   Title Patient will be independent with home exercises without cuing for core control, strengthening to allow self management once discharged from physical therapy 09/05/16   Baseline limited knowledge and moderate VC required for guided exercises and progression    Status Partially Met               Plan - 09/05/16 0920    Clinical Impression Statement Patient demonstrated good technique with exercises with minimal cuing. she  arrived with additional injury to left foot and is to see surgeon next week. She is not sure if she will require additional physical therapy intervention. She reports she is able to perform exercises as instruced. Plan: hold physical therapy until patient is seen by surgeon.    Rehab Potential Good   Clinical Impairments Affecting Rehab Potential (+) motivated (-) chronic condition of pain in hip/low back; multiple surgeries on spine within 6 months   PT Frequency 2x / week   PT Duration 6 weeks   PT Treatment/Interventions Manual techniques;Electrical Stimulation;Moist Heat;Patient/family education;Cryotherapy;Therapeutic exercise;Dry needling;Ultrasound   PT Next Visit Plan pain control, progress exercises as tolerated; stabilization exercises   PT Home Exercise Plan home program for stabilization and walking exercises      Patient will benefit from skilled therapeutic intervention in order to improve the following deficits and impairments:  Pain, Impaired flexibility, Difficulty walking, Increased muscle spasms, Decreased strength, Impaired perceived functional ability, Decreased activity tolerance, Decreased range of motion, Decreased endurance  Visit Diagnosis: Bilateral low back pain without sciatica, unspecified chronicity  Difficulty in walking, not elsewhere classified  Muscle weakness  (generalized)     Problem List Patient Active Problem List   Diagnosis Date Noted  . S/P lumbar spinal fusion 12/13/2015  . Controlled type 2 diabetes mellitus without complication ([place]) 37/54/3606  . Thrombocytopenia ([place]) 05/16/2015  . Recurrent major depressive disorder, in full remission ([place]) 05/16/2015  . Essential (primary) hypertension 05/16/2015  . Fatty infiltration of liver 03/15/2015  . Aortic valve stenosis, nonrheumatic 01/18/2015  . Sciatica of right side 01/09/2015  . Type 2 diabetes mellitus ([place]) 11/10/2014  . RLS (restless legs syndrome) 08/23/2014  . Neuritis or radiculitis due to rupture of lumbar intervertebral disc 06/13/2014  . Degeneration of intervertebral disc of lumbar region 06/13/2014  . Arthritis 01/23/2014  . Arthritis of knee, degenerative 11/15/2013  . Depression 09/29/2013  . Insomnia 09/29/2013  . Apnea, sleep 08/29/2013  . Calculus of kidney 08/29/2013  . Abnormal presence of protein in urine 08/29/2013  . Headache, migraine 08/29/2013  . BP (high blood pressure) 08/29/2013  . HLD (hyperlipidemia) 08/29/2013  . Allergic rhinitis 08/29/2013  . Intractable migraine without aura 02/18/2013    Jomarie Longs PT 09/06/2016, 3:13 PM  [facility] [address] [place], [address] Phone: [phone]   Fax:  [phone]  Name: Jessica Cole MRN: [id number] Date of Birth: February 19, 1951

## 2016-09-09 ENCOUNTER — Other Ambulatory Visit: Payer: Self-pay | Admitting: Neurology

## 2016-09-19 ENCOUNTER — Ambulatory Visit: Payer: Medicare HMO | Admitting: Physical Therapy

## 2016-09-22 ENCOUNTER — Ambulatory Visit: Payer: Medicare HMO | Admitting: Physical Therapy

## 2016-09-22 ENCOUNTER — Encounter: Payer: Self-pay | Admitting: Physical Therapy

## 2016-09-22 DIAGNOSIS — M6281 Muscle weakness (generalized): Secondary | ICD-10-CM

## 2016-09-22 DIAGNOSIS — R262 Difficulty in walking, not elsewhere classified: Secondary | ICD-10-CM

## 2016-09-22 DIAGNOSIS — M545 Low back pain, unspecified: Secondary | ICD-10-CM

## 2016-09-23 NOTE — Therapy (Signed)
[facility] [address], [place], [address] Phone: [phone]   Fax:  [phone]  Physical Therapy Treatment  Patient Details  Name: Jessica Cole MRN: [id number] Date of Birth: 1951/01/15 Referring Provider: Tia Alert MD  Encounter Date: 09/22/2016      PT End of Session - 09/22/16 1350    Visit Number 14   Number of Visits 20   Date for PT Re-Evaluation 11/03/16   Authorization Type 15   Authorization Time Period 20 G code   PT Start Time 1345   PT Stop Time 1425   PT Time Calculation (min) 40 min   Activity Tolerance Patient tolerated treatment well;Patient limited by pain   Behavior During Therapy [facility] for tasks assessed/performed      Past Medical History:  Diagnosis Date  . Anxiety   . Colon polyps   . Degenerative arthritis   . Depression   . Diabetes mellitus without complication ([facility])    Type II  . Environmental allergies   . Family history of adverse reaction to anesthesia    sister- PONV  . Fatty liver   . Glaucoma   . Headache(784.0)    HX  MIGRAINES  . History of bronchitis   . History of kidney stones   . History of pneumonia   . Hypertension   . Hypothyroidism   . Obesity   . OSA on CPAP   . Plantar fasciitis    right  . PONV (postoperative nausea and vomiting)   . Renal calculi   . Renal calculi   . RLS (restless legs syndrome) 08/23/2014    Past Surgical History:  Procedure Laterality Date  . ABDOMINAL HYSTERECTOMY     partial  . APPENDECTOMY    . Arthroscopic surgery, knee Left   . BREAST BIOPSY Right    several  . BUNIONECTOMY     LEFT   . COLONOSCOPY W/ POLYPECTOMY    . FOOT SURGERY     RIGHT    . KNEE ARTHROSCOPY W/ MENISCAL REPAIR Left   . LASIK    . MAXIMUM ACCESS (MAS)POSTERIOR LUMBAR INTERBODY FUSION (PLIF) 1 LEVEL N/A 04/25/2016   Procedure: LUMBAR FOUR-FIVE  MAXIMUM ACCESS (MAS) POSTERIOR LUMBAR INTERBODY FUSION (PLIF) with extension of  instrumentation LUMBAR TWO-FIVE;  Surgeon: Tia Alert, MD;  Location: [facility];  Service: Neurosurgery;  Laterality: N/A;  . MAXIMUM ACCESS (MAS)POSTERIOR LUMBAR INTERBODY FUSION (PLIF) 2 LEVEL N/A 12/13/2015   Procedure: Lumbar two-three - Lumbar three-four MAXIMUM ACCESS (MAS) POSTERIOR LUMBAR INTERBODY FUSION (PLIF)  ;  Surgeon: Tia Alert, MD;  Location: [facility] NEURO [facility]S;  Service: Neurosurgery;  Laterality: N/A;  . SHOULDER SURGERY     RIGHT   . TONSILLECTOMY      There were no vitals filed for this visit.      Subjective Assessment - 09/22/16 1347    Subjective patient reports "hurting all over" and did a lot of walking for 4 days.    Pertinent History Patient reports progressive worsening back and leg pain over the past year and a half. She had surgery 12/13/2015 for fusion L2-3, L3-4 and then increased pain with radiculopathy in left LE with subsequent surgery PLIF L4-5 04/25/2016. Since surgery she has been gradually increasing activity.    Limitations Sitting;Standing;Walking;House hold activities   How long can you sit comfortably? 30 min.   How long can you stand comfortably? 10 min.   How long can  you walk comfortably? 30 min.   Patient Stated Goals to return to full activity without difficulty   Currently in Pain? Yes   Pain Score 8    Pain Location Back   Pain Orientation Lower   Pain Descriptors / Indicators Aching;Tiring   Pain Type Chronic pain   Pain Onset More than a month ago   Pain Frequency Intermittent  with resting in bed no pain until she moves      Objective Gait: antalgic, Cam walker on left LE  Outcome measures MODI 30% impairment, LEFS and 6 min. Walk tests deferred due to patient having recent injury to left foot  Treatment:  Modalities: Moist heat applied to lower back during sitting exercises for pain control; no adverse reaction noted Therapeutic exercise:patient performed with tactile, verbal cues and demonstration of  therapist Sitting: scapular retraction with green resistive tubing single arm x 15 reps with assist of therapist and VC for correct technique and alignment Straight arm pull down with green resistive band with assist of therapist x 15 reps Hip adduction with ball and glute sets x 15 reps Hip abduction with green resistive band x 15 reps Hip flexion with green resistive band x 15 reps Knee extension to balance stone each LE x 15 reps Knee flexion with green resistive band 2 x 15 reps each LE  Patient response to treatment: patient demonstrated improved technique with exercises with minimal VC for correct alignment. Patient with decreased pain from  8/10 to 4/10. She continued with intermittent mild spasms in back throughout exercises.          PT Education - 09/22/16 1420    Education provided Yes   Education Details HEP re assesssed for core, LE's   Person(s) Educated Patient   Methods Explanation;Demonstration;Verbal cues   Comprehension Verbalized understanding;Returned demonstration;Verbal cues required             PT Long Term Goals - 09/22/16 1430      PT LONG TERM GOAL #1   Title patient will demonstrate improved function with decreased back pain with MODI score of 20% or less by 11/03/2016   Baseline MODI 34% (moderate self perceived disability) MODI 45% impaired 07/17/2016; 38% 08/08/2016; 30% 09/22/2016    Status Revised     PT LONG TERM GOAL #2   Title Patient  will demonstrate good knowledge of posture and body mechanics for back and be able to stand and walk > 1200' with 6 min. walk test by 11/03/2016 demonstrating improved endurance for community activities   Baseline 08/05/2016 950' with back pain : new injury to left foot prevents re assessment   Status Revised     PT LONG TERM GOAL #3   Title Improve  LEFS score to 50/80 or better  indicating improved function with daily tasks, improved LE strength by 11/03/2016   Baseline LEFs 30.5/80 08/08/2016; new injury to left  foot prevents re accurate re assessment   Status Revised     PT LONG TERM GOAL #4   Title Patient will be independent with home exercises without cuing for core control, strengthening to allow self management once discharged from physical therapy 11/03/16   Baseline limited knowledge and moderate VC required for guided exercises and progression    Status Revised               Plan - 09/22/16 1351    Clinical Impression Statement Patient is progressing steadily with strength and endurance with decreasing spasms and pain in  back. She recently had injury to left foot and is walking with CAM boot which is limiting standing and walking activties and contributing to lower back pain. She had MODI of 30% impairment and she will benefit from continued physical therapy to address strength deficits and pain in order to improve function with daily tasks and be able to transition to independent self managemnt.    Rehab Potential Good   Clinical Impairments Affecting Rehab Potential (+) motivated (-) chronic condition of pain in hip/low back; multiple surgeries on spine within 6 months   PT Frequency 2x / week   PT Duration 6 weeks   PT Treatment/Interventions Manual techniques;Electrical Stimulation;Moist Heat;Patient/family education;Cryotherapy;Therapeutic exercise;Dry needling;Ultrasound   PT Next Visit Plan pain control, progress exercises as tolerated; stabilization exercises   PT Home Exercise Plan home program for stabilization and strengthening exercises    Consulted and Agree with Plan of Care Patient      Patient will benefit from skilled therapeutic intervention in order to improve the following deficits and impairments:  Pain, Impaired flexibility, Difficulty walking, Increased muscle spasms, Decreased strength, Impaired perceived functional ability, Decreased activity tolerance, Decreased range of motion  Visit Diagnosis: Bilateral low back pain without sciatica, unspecified chronicity  - Plan: PT plan of care cert/re-cert  Muscle weakness (generalized) - Plan: PT plan of care cert/re-cert  Difficulty in walking, not elsewhere classified - Plan: PT plan of care cert/re-cert     Problem List Patient Active Problem List   Diagnosis Date Noted  . S/P lumbar spinal fusion 12/13/2015  . Controlled type 2 diabetes mellitus without complication ([facility]) 05/16/2015  . Thrombocytopenia ([facility]) 05/16/2015  . Recurrent major depressive disorder, in full remission ([facility]) 05/16/2015  . Essential (primary) hypertension 05/16/2015  . Fatty infiltration of liver 03/15/2015  . Aortic valve stenosis, nonrheumatic 01/18/2015  . Sciatica of right side 01/09/2015  . Type 2 diabetes mellitus ([facility]) 11/10/2014  . RLS (restless legs syndrome) 08/23/2014  . Neuritis or radiculitis due to rupture of lumbar intervertebral disc 06/13/2014  . Degeneration of intervertebral disc of lumbar region 06/13/2014  . Arthritis 01/23/2014  . Arthritis of knee, degenerative 11/15/2013  . Depression 09/29/2013  . Insomnia 09/29/2013  . Apnea, sleep 08/29/2013  . Calculus of kidney 08/29/2013  . Abnormal presence of protein in urine 08/29/2013  . Headache, migraine 08/29/2013  . BP (high blood pressure) 08/29/2013  . HLD (hyperlipidemia) 08/29/2013  . Allergic rhinitis 08/29/2013  . Intractable migraine without aura 02/18/2013    Beacher MayBrooks, Lang Zingg PT 09/23/2016, 12:06 PM  [facility] [address] [place], [place], [id number] Phone: [phone]   Fax:  [phone]  Name: Jessica Cole MRN: [id number] Date of Birth: [birth year]

## 2016-09-25 ENCOUNTER — Ambulatory Visit: Payer: Medicare HMO | Admitting: Physical Therapy

## 2016-09-25 ENCOUNTER — Encounter: Payer: Self-pay | Admitting: Physical Therapy

## 2016-09-25 DIAGNOSIS — M545 Low back pain, unspecified: Secondary | ICD-10-CM

## 2016-09-25 DIAGNOSIS — R262 Difficulty in walking, not elsewhere classified: Secondary | ICD-10-CM

## 2016-09-25 DIAGNOSIS — M6281 Muscle weakness (generalized): Secondary | ICD-10-CM

## 2016-09-25 NOTE — Therapy (Signed)
[place] [facility] [address] [place], [address] Phone: [phone]   Fax:  [phone]  Physical Therapy Treatment  Patient Details  Name: Jessica Cole MRN: [id number] Date of Birth: 1951-01-27 Referring Provider: Tia Alert MD  Encounter Date: 09/25/2016      PT End of Session - 09/25/16 1310    Visit Number 16   Number of Visits 20   Date for PT Re-Evaluation 11/03/16   Authorization Type 16   Authorization Time Period 20 G code   PT Start Time 1301   PT Stop Time 1336   PT Time Calculation (min) 35 min   Activity Tolerance Patient tolerated treatment well;Patient limited by pain   Behavior During Therapy [facility] for tasks assessed/performed      Past Medical History:  Diagnosis Date  . Anxiety   . Colon polyps   . Degenerative arthritis   . Depression   . Diabetes mellitus without complication (HCC)    Type II  . Environmental allergies   . Family history of adverse reaction to anesthesia    sister- PONV  . Fatty liver   . Glaucoma   . Headache(784.0)    HX  MIGRAINES  . History of bronchitis   . History of kidney stones   . History of pneumonia   . Hypertension   . Hypothyroidism   . Obesity   . OSA on CPAP   . Plantar fasciitis    right  . PONV (postoperative nausea and vomiting)   . Renal calculi   . Renal calculi   . RLS (restless legs syndrome) 08/23/2014    Past Surgical History:  Procedure Laterality Date  . ABDOMINAL HYSTERECTOMY     partial  . APPENDECTOMY    . Arthroscopic surgery, knee Left   . BREAST BIOPSY Right    several  . BUNIONECTOMY     LEFT   . COLONOSCOPY W/ POLYPECTOMY    . FOOT SURGERY     RIGHT    . KNEE ARTHROSCOPY W/ MENISCAL REPAIR Left   . LASIK    . MAXIMUM ACCESS (MAS)POSTERIOR LUMBAR INTERBODY FUSION (PLIF) 1 LEVEL N/A 04/25/2016   Procedure: LUMBAR FOUR-FIVE  MAXIMUM ACCESS (MAS) POSTERIOR LUMBAR INTERBODY FUSION (PLIF) with extension of  instrumentation LUMBAR TWO-FIVE;  Surgeon: Tia Alert, MD;  Location: [facility] OR;  Service: Neurosurgery;  Laterality: N/A;  . MAXIMUM ACCESS (MAS)POSTERIOR LUMBAR INTERBODY FUSION (PLIF) 2 LEVEL N/A 12/13/2015   Procedure: Lumbar two-three - Lumbar three-four MAXIMUM ACCESS (MAS) POSTERIOR LUMBAR INTERBODY FUSION (PLIF)  ;  Surgeon: Tia Alert, MD;  Location: [facility] NEURO ORS;  Service: Neurosurgery;  Laterality: N/A;  . SHOULDER SURGERY     RIGHT   . TONSILLECTOMY      There were no vitals filed for this visit.      Subjective Assessment - 09/25/16 1306    Subjective Patient reports being busy for the past few days with home activities and baking. Today 1-2/10 in back with stiffness and she is tired.    Pertinent History Patient reports progressive worsening back and leg pain over the past year and a half. She had surgery 12/13/2015 for fusion L2-3, L3-4 and then increased pain with radiculopathy in left LE with subsequent surgery PLIF L4-5 04/25/2016. Since surgery she has been gradually increasing activity.    Limitations Sitting;Standing;Walking;House hold activities   How long can you sit comfortably? 30 min.   How long can  you stand comfortably? 10 min.   How long can you walk comfortably? 30 min.   Patient Stated Goals to return to full activity without difficulty   Currently in Pain? Yes   Pain Score 2    Pain Location Back   Pain Orientation Lower   Pain Descriptors / Indicators Aching;Sore   Pain Type Chronic pain   Pain Onset More than a month ago   Pain Frequency Intermittent      Objective Gait: antalgic, Cam walker on left LE   Treatment:  Modalities: Moist heat applied to lower back during sitting exercises for pain control; no adverse reaction noted Therapeutic exercise:patient performed with tactile, verbal cues and demonstration of therapist Sitting: scapular retraction with green resistive tubing single arm x 15 reps with assist of therapist and VC for correct  technique and alignment Straight arm pull down with green resistive band with assist of therapist x 15 reps Palloff press with green resistive band x 15 reps Hip adduction with ball and glute sets x 15 reps Hip abduction with green resistive band x 15 reps Hip flexion with green resistive band x 15 reps Knee extension right LE x 15 reps Knee flexion with green resistive band  x 15 reps each LE  Patient response to treatment: Patient with increased pain and intermittent spasms in back with UE exercises, no worse at end of session. She was able to complete exercises with guidance and VC.          PT Education - 09/25/16 1340    Education provided Yes   Education Details conitnue with home program as instructed; discussed safety awareness with walking ith CAM boot   Person(s) Educated Patient   Methods Explanation   Comprehension Verbalized understanding             PT Long Term Goals - 09/22/16 1430      PT LONG TERM GOAL #1   Title patient will demonstrate improved function with decreased back pain with MODI score of 20% or less by 11/03/2016   Baseline MODI 34% (moderate self perceived disability) MODI 45% impaired 07/17/2016; 38% 08/08/2016; 30% 09/22/2016    Status Revised     PT LONG TERM GOAL #2   Title Patient  will demonstrate good knowledge of posture and body mechanics for back and be able to stand and walk > 1200' with 6 min. walk test by 11/03/2016 demonstrating improved endurance for community activities   Baseline 08/05/2016 950' with back pain : new injury to left foot prevents re assessment   Status Revised     PT LONG TERM GOAL #3   Title Improve  LEFS score to 50/80 or better  indicating improved function with daily tasks, improved LE strength by 11/03/2016   Baseline LEFs 30.5/80 08/08/2016; new injury to left foot prevents re accurate re assessment   Status Revised     PT LONG TERM GOAL #4   Title Patient will be independent with home exercises without cuing for  core control, strengthening to allow self management once discharged from physical therapy 11/03/16   Baseline limited knowledge and moderate VC required for guided exercises and progression    Status Revised               Plan - 09/25/16 1310    Clinical Impression Statement Patient continues to improve with strength however she is off balance with walking with CAM boot and needs to be more aware of safety with standing and walking.  Rehab Potential Good   Clinical Impairments Affecting Rehab Potential (+) motivated (-) chronic condition of pain in hip/low back; multiple surgeries on spine within 6 months   PT Frequency 2x / week   PT Duration 6 weeks   PT Treatment/Interventions Manual techniques;Electrical Stimulation;Moist Heat;Patient/family education;Cryotherapy;Therapeutic exercise;Dry needling;Ultrasound   PT Next Visit Plan pain control, progress exercises as tolerated; stabilization exercises   PT Home Exercise Plan home program for stabilization and strengthening exercises       Patient will benefit from skilled therapeutic intervention in order to improve the following deficits and impairments:  Pain, Impaired flexibility, Difficulty walking, Increased muscle spasms, Decreased strength, Impaired perceived functional ability, Decreased activity tolerance, Decreased range of motion  Visit Diagnosis: Bilateral low back pain without sciatica, unspecified chronicity  Muscle weakness (generalized)  Difficulty in walking, not elsewhere classified     Problem List Patient Active Problem List   Diagnosis Date Noted  . S/P lumbar spinal fusion 12/13/2015  . Controlled type 2 diabetes mellitus without complication (HCC) 05/16/2015  . Thrombocytopenia (HCC) 05/16/2015  . Recurrent major depressive disorder, in full remission (HCC) 05/16/2015  . Essential (primary) hypertension 05/16/2015  . Fatty infiltration of liver 03/15/2015  . Aortic valve stenosis, nonrheumatic  01/18/2015  . Sciatica of right side 01/09/2015  . Type 2 diabetes mellitus (HCC) 11/10/2014  . RLS (restless legs syndrome) 08/23/2014  . Neuritis or radiculitis due to rupture of lumbar intervertebral disc 06/13/2014  . Degeneration of intervertebral disc of lumbar region 06/13/2014  . Arthritis 01/23/2014  . Arthritis of knee, degenerative 11/15/2013  . Depression 09/29/2013  . Insomnia 09/29/2013  . Apnea, sleep 08/29/2013  . Calculus of kidney 08/29/2013  . Abnormal presence of protein in urine 08/29/2013  . Headache, migraine 08/29/2013  . BP (high blood pressure) 08/29/2013  . HLD (hyperlipidemia) 08/29/2013  . Allergic rhinitis 08/29/2013  . Intractable migraine without aura 02/18/2013    Beacher MayBrooks, Gerritt Galentine PT 09/26/2016, 7:08 PM  [place] [facility] [address] [place], [place], [address] Phone: [phone]   Fax:  [phone]  Name: Nobie PutnamGail T Arntson MRN: [id number] Date of Birth: [birth year]

## 2016-09-30 ENCOUNTER — Encounter: Payer: Self-pay | Admitting: Physical Therapy

## 2016-09-30 ENCOUNTER — Ambulatory Visit: Payer: Medicare HMO | Admitting: Physical Therapy

## 2016-09-30 DIAGNOSIS — M6281 Muscle weakness (generalized): Secondary | ICD-10-CM

## 2016-09-30 DIAGNOSIS — R262 Difficulty in walking, not elsewhere classified: Secondary | ICD-10-CM

## 2016-09-30 DIAGNOSIS — M545 Low back pain, unspecified: Secondary | ICD-10-CM

## 2016-09-30 NOTE — Therapy (Signed)
[place] [facility] [address] [place], [place], [address] Phone: [phone]   Fax:  [phone]  Physical Therapy Treatment  Patient Details  Name: Jessica PutnamGail T Cole MRN: [id number] Date of Birth: 1950/07/13 Referring Provider: Tia AlertJones, David S MD  Encounter Date: 09/30/2016      PT End of Session - 09/30/16 1355    Visit Number 17   Number of Visits 20   Date for PT Re-Evaluation 11/03/16   Authorization Type 17   Authorization Time Period 20 G code   PT Start Time 1345   PT Stop Time 1415   PT Time Calculation (min) 30 min   Activity Tolerance Patient tolerated treatment well;Patient limited by pain   Behavior [facility] Therapy [facility] for tasks assessed/performed      Past Medical History:  Diagnosis Date  . Anxiety   . Colon polyps   . Degenerative arthritis   . Depression   . Diabetes mellitus without complication (HCC)    Type II  . Environmental allergies   . Family history of adverse reaction to anesthesia    sister- PONV  . Fatty liver   . Glaucoma   . Headache(784.0)    HX  MIGRAINES  . History of bronchitis   . History of kidney stones   . History of pneumonia   . Hypertension   . Hypothyroidism   . Obesity   . OSA on CPAP   . Plantar fasciitis    right  . PONV (postoperative nausea and vomiting)   . Renal calculi   . Renal calculi   . RLS (restless legs syndrome) 08/23/2014    Past Surgical History:  Procedure Laterality Date  . ABDOMINAL HYSTERECTOMY     partial  . APPENDECTOMY    . Arthroscopic surgery, knee Left   . BREAST BIOPSY Right    several  . BUNIONECTOMY     LEFT   . COLONOSCOPY W/ POLYPECTOMY    . FOOT SURGERY     RIGHT    . KNEE ARTHROSCOPY W/ MENISCAL REPAIR Left   . LASIK    . MAXIMUM ACCESS (MAS)POSTERIOR LUMBAR INTERBODY FUSION (PLIF) 1 LEVEL N/A 04/25/2016   Procedure: LUMBAR FOUR-FIVE  MAXIMUM ACCESS (MAS) POSTERIOR LUMBAR INTERBODY FUSION (PLIF) with extension of  instrumentation LUMBAR TWO-FIVE;  Surgeon: Tia Alertavid S Jones, MD;  [facility];  Service: Neurosurgery;  Laterality: N/A;  . MAXIMUM ACCESS (MAS)POSTERIOR LUMBAR INTERBODY FUSION (PLIF) 2 LEVEL N/A 12/13/2015   Procedure: Lumbar two-three - Lumbar three-four MAXIMUM ACCESS (MAS) POSTERIOR LUMBAR INTERBODY FUSION (PLIF)  ;  Surgeon: Tia Alertavid S Jones, MD;  [facility];  Service: Neurosurgery;  Laterality: N/A;  . SHOULDER SURGERY     RIGHT   . TONSILLECTOMY      There were no vitals filed for this visit.      Subjective Assessment - 09/30/16 1346    Subjective Patient reports getting a new mattress and is feeling increased pain into bilateral hips, buttocks today with soreness noted today.    Pertinent History Patient reports progressive worsening back and leg pain over the past year and a half. She had surgery 12/13/2015 for fusion L2-3, L3-4 and then increased pain with radiculopathy in left LE with subsequent surgery PLIF L4-5 04/25/2016. Since surgery she has been gradually increasing activity.    Limitations Sitting;Standing;Walking;House hold activities   How long can you sit comfortably? 30 min.   How long can you stand comfortably? 10  min.   How long can you walk comfortably? 30 min.   Patient Stated Goals to return to full activity without difficulty   Currently in Pain? Yes   Pain Score 3    Pain Location Back   Pain Orientation Lower   Pain Descriptors / Indicators Aching;Sore   Pain Type Chronic pain  with more acute soreness with change in mattresses   Pain Onset More than a month ago   Pain Frequency Intermittent      Objective Gait: antalgic, Cam walker on left LE  Treatment:  Modalities: Moist heat applied to lower back during sitting exercises for pain control; no adverse reaction noted Therapeutic exercise:patient performed with tactile, verbal cues and demonstration of therapist Sitting: scapular retraction with green resistive tubing single armx 15  reps with assist of therapist and VC for correct technique and alignment Straight arm pull down with green resistive band with assist of therapist x 15 reps Palloff press with green resistive band x 15 reps Hip adduction with ball and glute sets x 15 reps Hip abduction with green resistive band x 15 reps, then single leg x 15 reps each Hip flexion with green resistive band x 15 reps Knee flexion with green resistive band  x 15 reps each LE  Patient response to treatment: Patient able to complete all exercises with minimal VC for correct technique and no increased pain reported during exercises.         PT Education - 09/30/16 1348    Education provided Yes   Education Details posture, mattress change and effect on soreness in lower back   Person(s) Educated Patient   Methods Explanation   Comprehension Verbalized understanding             PT Long Term Goals - 09/22/16 1430      PT LONG TERM GOAL #1   Title patient will demonstrate improved function with decreased back pain with MODI score of 20% or less by 11/03/2016   Baseline MODI 34% (moderate self perceived disability) MODI 45% impaired 07/17/2016; 38% 08/08/2016; 30% 09/22/2016    Status Revised     PT LONG TERM GOAL #2   Title Patient  will demonstrate good knowledge of posture and body mechanics for back and be able to stand and walk > 1200' with 6 min. walk test by 11/03/2016 demonstrating improved endurance for community activities   Baseline 08/05/2016 950' with back pain : new injury to left foot prevents re assessment   Status Revised     PT LONG TERM GOAL #3   Title Improve  LEFS score to 50/80 or better  indicating improved function with daily tasks, improved LE strength by 11/03/2016   Baseline LEFs 30.5/80 08/08/2016; new injury to left foot prevents re accurate re assessment   Status Revised     PT LONG TERM GOAL #4   Title Patient will be independent with home exercises without cuing for core control,  strengthening to allow self management once discharged from physical therapy 11/03/16   Baseline limited knowledge and moderate VC required for guided exercises and progression    Status Revised               Plan - 09/30/16 1415    Clinical Impression Statement Patient with decreased strength and endurance, improved spasms and pain level with exercises and should continue with additional physiccal therapy intervention.    Rehab Potential Good   Clinical Impairments Affecting Rehab Potential (+) motivated (-) chronic condition  of pain in hip/low back; multiple surgeries on spine within 6 months   PT Frequency 2x / week   PT Duration 6 weeks   PT Treatment/Interventions Manual techniques;Electrical Stimulation;Moist Heat;Patient/family education;Cryotherapy;Therapeutic exercise;Dry needling;Ultrasound   PT Next Visit Plan pain control, progress exercises as tolerated; stabilization exercises   PT Home Exercise Plan home program for stabilization and strengthening exercises       Patient will benefit from skilled therapeutic intervention in order to improve the following deficits and impairments:  Pain, Impaired flexibility, Difficulty walking, Increased muscle spasms, Decreased strength, Impaired perceived functional ability, Decreased activity tolerance, Decreased range of motion  Visit Diagnosis: Bilateral low back pain without sciatica, unspecified chronicity  Muscle weakness (generalized)  Difficulty in walking, not elsewhere classified     Problem List Patient Active Problem List   Diagnosis Date Noted  . S/P lumbar spinal fusion 12/13/2015  . Controlled type 2 diabetes mellitus without complication (HCC) 05/16/2015  . Thrombocytopenia (HCC) 05/16/2015  . Recurrent major depressive disorder, in full remission (HCC) 05/16/2015  . Essential (primary) hypertension 05/16/2015  . Fatty infiltration of liver 03/15/2015  . Aortic valve stenosis, nonrheumatic 01/18/2015  .  Sciatica of right side 01/09/2015  . Type 2 diabetes mellitus (HCC) 11/10/2014  . RLS (restless legs syndrome) 08/23/2014  . Neuritis or radiculitis due to rupture of lumbar intervertebral disc 06/13/2014  . Degeneration of intervertebral disc of lumbar region 06/13/2014  . Arthritis 01/23/2014  . Arthritis of knee, degenerative 11/15/2013  . Depression 09/29/2013  . Insomnia 09/29/2013  . Apnea, sleep 08/29/2013  . Calculus of kidney 08/29/2013  . Abnormal presence of protein in urine 08/29/2013  . Headache, migraine 08/29/2013  . BP (high blood pressure) 08/29/2013  . HLD (hyperlipidemia) 08/29/2013  . Allergic rhinitis 08/29/2013  . Intractable migraine without aura 02/18/2013    Beacher May PT 09/30/2016, 7:17 PM  [place] [facility] PHYSICAL AND SPORTS MEDICINE [address], [place], [address] Phone: [phone]   Fax:  [phone]  Name: Jessica Cole MRN: [id number] Date of Birth: [birth year]

## 2016-10-02 ENCOUNTER — Encounter: Payer: Self-pay | Admitting: Physical Therapy

## 2016-10-02 ENCOUNTER — Ambulatory Visit: Payer: Medicare HMO | Admitting: Physical Therapy

## 2016-10-02 DIAGNOSIS — M545 Low back pain, unspecified: Secondary | ICD-10-CM

## 2016-10-02 DIAGNOSIS — R262 Difficulty in walking, not elsewhere classified: Secondary | ICD-10-CM

## 2016-10-02 DIAGNOSIS — M6281 Muscle weakness (generalized): Secondary | ICD-10-CM

## 2016-10-02 NOTE — Therapy (Signed)
[facility] PHYSICAL AND SPORTS MEDICINE [address] [place], [address] Phone: [phone]   Fax:  [phone]  Physical Therapy Treatment  Patient Details  Name: Jessica Cole MRN: [id number] Date of Birth: 12/07/1950 Referring Provider: Tia Alert MD  Encounter Date: 10/02/2016      PT End of Session - 10/02/16 1352    Visit Number 18   Number of Visits 20   Date for PT Re-Evaluation 11/03/16   Authorization Type 18   Authorization Time Period 20 G code   PT Start Time 1348   PT Stop Time 1422   PT Time Calculation (min) 34 min   Activity Tolerance Patient tolerated treatment well;Patient limited by pain   Behavior During Therapy [facility] for tasks assessed/performed      Past Medical History:  Diagnosis Date  . Anxiety   . Colon polyps   . Degenerative arthritis   . Depression   . Diabetes mellitus without complication (HCC)    Type II  . Environmental allergies   . Family history of adverse reaction to anesthesia    sister- PONV  . Fatty liver   . Glaucoma   . Headache(784.0)    HX  MIGRAINES  . History of bronchitis   . History of kidney stones   . History of pneumonia   . Hypertension   . Hypothyroidism   . Obesity   . OSA on CPAP   . Plantar fasciitis    right  . PONV (postoperative nausea and vomiting)   . Renal calculi   . Renal calculi   . RLS (restless legs syndrome) 08/23/2014    Past Surgical History:  Procedure Laterality Date  . ABDOMINAL HYSTERECTOMY     partial  . APPENDECTOMY    . Arthroscopic surgery, knee Left   . BREAST BIOPSY Right    several  . BUNIONECTOMY     LEFT   . COLONOSCOPY W/ POLYPECTOMY    . FOOT SURGERY     RIGHT    . KNEE ARTHROSCOPY W/ MENISCAL REPAIR Left   . LASIK    . MAXIMUM ACCESS (MAS)POSTERIOR LUMBAR INTERBODY FUSION (PLIF) 1 LEVEL N/A 04/25/2016   Procedure: LUMBAR FOUR-FIVE  MAXIMUM ACCESS (MAS) POSTERIOR LUMBAR INTERBODY FUSION (PLIF) with extension of  instrumentation LUMBAR TWO-FIVE;  Surgeon: Tia Alert, MD;  Location: [facility] OR;  Service: Neurosurgery;  Laterality: N/A;  . MAXIMUM ACCESS (MAS)POSTERIOR LUMBAR INTERBODY FUSION (PLIF) 2 LEVEL N/A 12/13/2015   Procedure: Lumbar two-three - Lumbar three-four MAXIMUM ACCESS (MAS) POSTERIOR LUMBAR INTERBODY FUSION (PLIF)  ;  Surgeon: Tia Alert, MD;  Location: [facility] NEURO ORS;  Service: Neurosurgery;  Laterality: N/A;  . SHOULDER SURGERY     RIGHT   . TONSILLECTOMY      There were no vitals filed for this visit.      Subjective Assessment - 10/02/16 1349    Subjective Patient reports she has been busy at home doing household tasks putting things in attic etc. and she is sore all over.    Pertinent History Patient reports progressive worsening back and leg pain over the past year and a half. She had surgery 12/13/2015 for fusion L2-3, L3-4 and then increased pain with radiculopathy in left LE with subsequent surgery PLIF L4-5 04/25/2016. Since surgery she has been gradually increasing activity.    Limitations Sitting;Standing;Walking;House hold activities   How long can you sit comfortably? 30 min.   How long can you stand  comfortably? 10 min.   How long can you walk comfortably? 30 min.   Patient Stated Goals to return to full activity without difficulty   Currently in Pain? Yes   Pain Score 1    Pain Location Back   Pain Orientation Lower   Pain Descriptors / Indicators Aching;Sore   Pain Type Chronic pain   Pain Onset More than a month ago   Pain Frequency Intermittent      Objective Gait: antalgic, Cam walker on left LE  Treatment:  Therapeutic exercise:patient performed with tactile, verbal cues and demonstration of therapist Sitting: Bilateral scapular retraction with blue resistive band x 20 reps with assistance of therapist scapular retraction with doubled blue resistive tubing single armx 15 reps with assist of therapist and VC for correct technique and  alignment Straight arm pull down with blue resistive band with assist of therapist x 20 reps Palloff press with blue resistive band x 25 reps Ball under right LE with 3# weight on ankle, roll back and forth x 30 Knee extension x 15 reps right LE only Hip adduction with ball and glute sets x 15 reps Hip abduction with red resistive band x 20 reps, then single leg x 20 reps each Hip flexion with red resistive band x 20 reps Knee flexion with red resistive band 2 x 15 reps right LE  Patient response to treatment: Patient able to increase repetitions and resistance to blue tubing with most exercises. She required VC to perform exercises with good alignment and technique. No increased pain reported during session.             PT Education - 10/02/16 1351    Education provided Yes   Education Details safety with walking with CAM boot and using cane for balance   Person(s) Educated Patient   Methods Explanation   Comprehension Verbalized understanding             PT Long Term Goals - 09/22/16 1430      PT LONG TERM GOAL #1   Title patient will demonstrate improved function with decreased back pain with MODI score of 20% or less by 11/03/2016   Baseline MODI 34% (moderate self perceived disability) MODI 45% impaired 07/17/2016; 38% 08/08/2016; 30% 09/22/2016    Status Revised     PT LONG TERM GOAL #2   Title Patient  will demonstrate good knowledge of posture and body mechanics for back and be able to stand and walk > 1200' with 6 min. walk test by 11/03/2016 demonstrating improved endurance for community activities   Baseline 08/05/2016 950' with back pain : new injury to left foot prevents re assessment   Status Revised     PT LONG TERM GOAL #3   Title Improve  LEFS score to 50/80 or better  indicating improved function with daily tasks, improved LE strength by 11/03/2016   Baseline LEFs 30.5/80 08/08/2016; new injury to left foot prevents re accurate re assessment   Status Revised      PT LONG TERM GOAL #4   Title Patient will be independent with home exercises without cuing for core control, strengthening to allow self management once discharged from physical therapy 11/03/16   Baseline limited knowledge and moderate VC required for guided exercises and progression    Status Revised               Plan - 10/02/16 1352    Clinical Impression Statement Patient with decreased strength and endurance, improved spasms and  pain level with exercises and should continue to improve with additional physical therapy intervention.    Rehab Potential Good   Clinical Impairments Affecting Rehab Potential (+) motivated (-) chronic condition of pain in hip/low back; multiple surgeries on spine within 6 months   PT Frequency 2x / week   PT Duration 6 weeks   PT Treatment/Interventions Manual techniques;Electrical Stimulation;Moist Heat;Patient/family education;Cryotherapy;Therapeutic exercise;Dry needling;Ultrasound   PT Next Visit Plan pain control, progress exercises as tolerated; stabilization exercises   PT Home Exercise Plan home program for stabilization and strengthening exercises       Patient will benefit from skilled therapeutic intervention in order to improve the following deficits and impairments:  Pain, Impaired flexibility, Difficulty walking, Increased muscle spasms, Decreased strength, Impaired perceived functional ability, Decreased activity tolerance, Decreased range of motion  Visit Diagnosis: Bilateral low back pain without sciatica, unspecified chronicity  Muscle weakness (generalized)  Difficulty in walking, not elsewhere classified     Problem List Patient Active Problem List   Diagnosis Date Noted  . S/P lumbar spinal fusion 12/13/2015  . Controlled type 2 diabetes mellitus without complication (HCC) 05/16/2015  . Thrombocytopenia (HCC) 05/16/2015  . Recurrent major depressive disorder, in full remission (HCC) 05/16/2015  . Essential (primary)  hypertension 05/16/2015  . Fatty infiltration of liver 03/15/2015  . Aortic valve stenosis, nonrheumatic 01/18/2015  . Sciatica of right side 01/09/2015  . Type 2 diabetes mellitus (HCC) 11/10/2014  . RLS (restless legs syndrome) 08/23/2014  . Neuritis or radiculitis due to rupture of lumbar intervertebral disc 06/13/2014  . Degeneration of intervertebral disc of lumbar region 06/13/2014  . Arthritis 01/23/2014  . Arthritis of knee, degenerative 11/15/2013  . Depression 09/29/2013  . Insomnia 09/29/2013  . Apnea, sleep 08/29/2013  . Calculus of kidney 08/29/2013  . Abnormal presence of protein in urine 08/29/2013  . Headache, migraine 08/29/2013  . BP (high blood pressure) 08/29/2013  . HLD (hyperlipidemia) 08/29/2013  . Allergic rhinitis 08/29/2013  . Intractable migraine without aura 02/18/2013    Beacher May PT 10/02/2016, 10:45 PM  [facility] PHYSICAL AND SPORTS MEDICINE [address], [place], [address] Phone: [phone]   Fax:  [phone]  Name: Jessica Cole MRN: [id number] Date of Birth: 22-Sep-1950

## 2016-10-06 ENCOUNTER — Encounter: Payer: Self-pay | Admitting: Physical Therapy

## 2016-10-06 ENCOUNTER — Ambulatory Visit: Payer: Medicare HMO | Attending: Neurological Surgery | Admitting: Physical Therapy

## 2016-10-06 DIAGNOSIS — R262 Difficulty in walking, not elsewhere classified: Secondary | ICD-10-CM | POA: Diagnosis present

## 2016-10-06 DIAGNOSIS — M545 Low back pain, unspecified: Secondary | ICD-10-CM

## 2016-10-06 DIAGNOSIS — M6281 Muscle weakness (generalized): Secondary | ICD-10-CM | POA: Insufficient documentation

## 2016-10-06 NOTE — Therapy (Signed)
[facility]ORTS [facility]ICINE [address] [place], [address] Phone: [phone]   Fax:  [phone]  Physical Therapy Treatment  Patient Details  Name: ELLSIE VIOLETTE MRN: [id number] Date of Birth: 1950-09-08 Referring Provider: Tia Alert MD  Encounter Date: 10/06/2016      PT End of Session - 10/06/16 1313    Visit Number 19   Number of Visits 20   Date for PT Re-Evaluation 11/03/16   Authorization Type 19   Authorization Time Period 20 G code   PT Start Time 1307   PT Stop Time 1340   PT Time Calculation (min) 33 min   Activity Tolerance Patient tolerated treatment well;Patient limited by pain   Behavior During Therapy [facility] for tasks assessed/performed      Past Medical History:  Diagnosis Date  . Anxiety   . Colon polyps   . Degenerative arthritis   . Depression   . Diabetes mellitus without complication (HCC)    Type II  . Environmental allergies   . Family history of adverse reaction to anesthesia    sister- PONV  . Fatty liver   . Glaucoma   . Headache(784.0)    HX  MIGRAINES  . History of bronchitis   . History of kidney stones   . History of pneumonia   . Hypertension   . Hypothyroidism   . Obesity   . OSA on CPAP   . Plantar fasciitis    right  . PONV (postoperative nausea and vomiting)   . Renal calculi   . Renal calculi   . RLS (restless legs syndrome) 08/23/2014    Past Surgical History:  Procedure Laterality Date  . ABDOMINAL HYSTERECTOMY     partial  . APPENDECTOMY    . Arthroscopic surgery, knee Left   . BREAST BIOPSY Right    several  . BUNIONECTOMY     LEFT   . COLONOSCOPY W/ POLYPECTOMY    . FOOT SURGERY     RIGHT    . KNEE ARTHROSCOPY W/ MENISCAL REPAIR Left   . LASIK    . MAXIMUM ACCESS (MAS)POSTERIOR LUMBAR INTERBODY FUSION (PLIF) 1 LEVEL N/A 04/25/2016   Procedure: LUMBAR FOUR-FIVE  MAXIMUM ACCESS (MAS) POSTERIOR LUMBAR INTERBODY FUSION (PLIF) with extension of  instrumentation LUMBAR TWO-FIVE;  Surgeon: Tia Alert, MD;  Location: [facility] OR;  Service: Neurosurgery;  Laterality: N/A;  . MAXIMUM ACCESS (MAS)POSTERIOR LUMBAR INTERBODY FUSION (PLIF) 2 LEVEL N/A 12/13/2015   Procedure: Lumbar two-three - Lumbar three-four MAXIMUM ACCESS (MAS) POSTERIOR LUMBAR INTERBODY FUSION (PLIF)  ;  Surgeon: Tia Alert, MD;  Location: [facility] NEURO ORS;  Service: Neurosurgery;  Laterality: N/A;  . SHOULDER SURGERY     RIGHT   . TONSILLECTOMY      There were no vitals filed for this visit.      Subjective Assessment - 10/06/16 1311    Subjective Patient reports she has been arranging her shed work space and is feeling soreness in her back today, mild.   Pertinent History Patient reports progressive worsening back and leg pain over the past year and a half. She had surgery 12/13/2015 for fusion L2-3, L3-4 and then increased pain with radiculopathy in left LE with subsequent surgery PLIF L4-5 04/25/2016. Since surgery she has been gradually increasing activity.    Limitations Sitting;Standing;Walking;House hold activities   How long can you sit comfortably? 30 min.   How long can you stand comfortably? 10 min.  How long can you walk comfortably? 30 min.   Patient Stated Goals to return to full activity without difficulty   Currently in Pain? Yes   Pain Score 1    Pain Location Back   Pain Orientation Lower   Pain Descriptors / Indicators Aching;Sore   Pain Type Chronic pain   Pain Onset More than a month ago   Pain Frequency Intermittent      Objective Gait: WNL using Cam walker on left LE  Treatment:  Therapeutic exercise:patient performed with tactile, verbal cues and demonstration of therapist Sitting: Bilateral scapular retraction with blue resistive band x 20 reps with assistance of therapist scapular retraction with doubled blue resistive tubing single armx 15 reps with assist of therapist and VC for correct technique and alignment Straight arm pull  down with blue resistive band with assist of therapist 2 x 20 reps Palloff press with blue resistive band  2 x 20 reps right LE with 3# weight on ankle, tap balance stone in front with heel tap x 25 reps Hip abduction with blue resistive band x 20 reps Hip flexion with blue resistive band 2 x  reps Knee flexion with red resistive band 2 x 15 reps right LE, 1 x 15 reps left LE  Patient response to treatment: Patient response to treatment: patient demonstrated improved technique with exercises with minimal VC for correct alignment. Improved motor control with repetition and cuing. Mild fatigue reported with each exercise near end of repetitions, no increased pain reported throughout session.          PT Education - 10/06/16 1355    Education provided Yes   Education Details using blue resistive band for most exercises; correct technique and alignment of trunk, UE's and LE's during exercises   Person(s) Educated Patient   Methods Explanation;Demonstration;Verbal cues   Comprehension Verbalized understanding;Returned demonstration;Verbal cues required             PT Long Term Goals - 09/22/16 1430      PT LONG TERM GOAL #1   Title patient will demonstrate improved function with decreased back pain with MODI score of 20% or less by 11/03/2016   Baseline MODI 34% (moderate self perceived disability) MODI 45% impaired 07/17/2016; 38% 08/08/2016; 30% 09/22/2016    Status Revised     PT LONG TERM GOAL #2   Title Patient  will demonstrate good knowledge of posture and body mechanics for back and be able to stand and walk > 1200' with 6 min. walk test by 11/03/2016 demonstrating improved endurance for community activities   Baseline 08/05/2016 950' with back pain : new injury to left foot prevents re assessment   Status Revised     PT LONG TERM GOAL #3   Title Improve  LEFS score to 50/80 or better  indicating improved function with daily tasks, improved LE strength by 11/03/2016   Baseline  LEFs 30.5/80 08/08/2016; new injury to left foot prevents re accurate re assessment   Status Revised     PT LONG TERM GOAL #4   Title Patient will be independent with home exercises without cuing for core control, strengthening to allow self management once discharged from physical therapy 11/03/16   Baseline limited knowledge and moderate VC required for guided exercises and progression    Status Revised               Plan - 10/06/16 1314    Clinical Impression Statement Patient demonstrated improvement with increased activity at home  with mild increase in lower back soreness which indicates improvement with strength and endurance with good carry over. She continues to require verbal cues and demonstration to perform exercises with correct technique and will require additional physical therapy interveniotn to achieve goals. Slow progress due to new, recent injury to left foot which limits standing exercises.   Rehab Potential Good   Clinical Impairments Affecting Rehab Potential (+) motivated (-) chronic condition of pain in hip/low back; multiple surgeries on spine within 6 months   PT Frequency 2x / week   PT Duration 6 weeks   PT Treatment/Interventions Manual techniques;Electrical Stimulation;Moist Heat;Patient/family education;Cryotherapy;Therapeutic exercise;Dry needling;Ultrasound   PT Next Visit Plan pain control, progress exercises as tolerated; stabilization exercises   PT Home Exercise Plan home program for stabilization and strengthening exercises       Patient will benefit from skilled therapeutic intervention in order to improve the following deficits and impairments:  Pain, Impaired flexibility, Difficulty walking, Increased muscle spasms, Decreased strength, Impaired perceived functional ability, Decreased activity tolerance, Decreased range of motion  Visit Diagnosis: Bilateral low back pain without sciatica, unspecified chronicity  Muscle weakness  (generalized)     Problem List Patient Active Problem List   Diagnosis Date Noted  . S/P lumbar spinal fusion 12/13/2015  . Controlled type 2 diabetes mellitus without complication (HCC) 05/16/2015  . Thrombocytopenia (HCC) 05/16/2015  . Recurrent major depressive disorder, in full remission (HCC) 05/16/2015  . Essential (primary) hypertension 05/16/2015  . Fatty infiltration of liver 03/15/2015  . Aortic valve stenosis, nonrheumatic 01/18/2015  . Sciatica of right side 01/09/2015  . Type 2 diabetes mellitus (HCC) 11/10/2014  . RLS (restless legs syndrome) 08/23/2014  . Neuritis or radiculitis due to rupture of lumbar intervertebral disc 06/13/2014  . Degeneration of intervertebral disc of lumbar region 06/13/2014  . Arthritis 01/23/2014  . Arthritis of knee, degenerative 11/15/2013  . Depression 09/29/2013  . Insomnia 09/29/2013  . Apnea, sleep 08/29/2013  . Calculus of kidney 08/29/2013  . Abnormal presence of protein in urine 08/29/2013  . Headache, migraine 08/29/2013  . BP (high blood pressure) 08/29/2013  . HLD (hyperlipidemia) 08/29/2013  . Allergic rhinitis 08/29/2013  . Intractable migraine without aura 02/18/2013    Beacher May PT 10/06/2016, 1:56 PM  [facility] [place] PHYSICAL AND SPORTS MEDICINE [address], [place], [address] Phone: [phone]   Fax:  [phone]  Name: HETAL PROANO MRN: [id number] Date of Birth: 05-10-1950

## 2016-10-09 ENCOUNTER — Encounter: Payer: Self-pay | Admitting: Physical Therapy

## 2016-10-09 ENCOUNTER — Telehealth: Payer: Self-pay | Admitting: Neurology

## 2016-10-09 ENCOUNTER — Ambulatory Visit: Payer: Medicare HMO | Admitting: Physical Therapy

## 2016-10-09 DIAGNOSIS — M545 Low back pain, unspecified: Secondary | ICD-10-CM

## 2016-10-09 DIAGNOSIS — M6281 Muscle weakness (generalized): Secondary | ICD-10-CM

## 2016-10-09 DIAGNOSIS — R262 Difficulty in walking, not elsewhere classified: Secondary | ICD-10-CM

## 2016-10-09 NOTE — Telephone Encounter (Signed)
Per MM,NP since she does not have a current headache we will hold off on calling anything in. She can call back if she has a headache that comes back and last greater than 24 hr. At that time, we can call in prednisone dosepak for her. She has option to call after hours if over the weekend.

## 2016-10-09 NOTE — Telephone Encounter (Signed)
Called patient back. Relayed message per MM,NP. She verbalized understanding and will call back if she has further questions or concerns.

## 2016-10-09 NOTE — Telephone Encounter (Signed)
Called patient back. She has tried taking excedrin migraine. 2 tablets at time. It has been ineffective. She stated oxycodone she took for back surgery. But had hallucinations. Does not want this again.  She would like something a little bit stronger than OTC medications for her headaches.    She does not have pain currently. Went to PT and they massaged her head and neck. She if concerned it will come back tomorrow and she will have nothing to help with HA.  She has tried Rizatriptan, sumatriptan, (triptans) in the past but does not want these. She stated they were ineffective.   Advised I will speak with MM,NP to advise. We will call her back to advise

## 2016-10-09 NOTE — Telephone Encounter (Signed)
Pt is requesting pain medication for HA's. She said she was given pain medication for back surgery and when she was weaned off the HA's returned within a month. Said she's had a mild HA all this week. She doesn't remember the name of the medication. Said she needs something stronger than OTC. Please call

## 2016-10-10 MED ORDER — TRAZODONE HCL 100 MG PO TABS: ORAL_TABLET | ORAL | 5 refills | Status: DC

## 2016-10-10 MED ORDER — PREDNISONE 5 MG PO TABS: ORAL_TABLET | ORAL | 0 refills | Status: DC

## 2016-10-10 NOTE — Telephone Encounter (Signed)
Pt said she thinks prednisone will be ok as long as it is not dexamethasone. So she is ok with RX being sent to [facility]. She is wanting to know if trazodone will be called in? I advised if there are any questions reg that medication NP will call otherwise it should be at the pharmacy.

## 2016-10-10 NOTE — Telephone Encounter (Signed)
Pt called scheduled appt for 6/28 with NP. Pt is requesting refill for traZODone (DESYREL) 100 MG tablet and call a dose pack sent to [facility]. Please call

## 2016-10-10 NOTE — Therapy (Signed)
[place] [facility] PHYS[facility] AND SPORTS MEDICINE [address] [place], [address] Phone: [phone]   Fax:  [phone]  Physical Therapy Treatment  Patient Details  Name: Jessica Cole MRN: [id number] Date of Birth: May 13, 1950 Referring Provider: Tia Alert MD  Encounter Date: 10/09/2016      PT End of Session - 10/09/16 1526    Visit Number 20   Number of Visits 27   Date for PT Re-Evaluation 11/03/16   Authorization Type 20   Authorization Time Period 20 G code   PT Start Time 1520   PT Stop Time 1550   PT Time Calculation (min) 30 min   Activity Tolerance Patient tolerated treatment well;Patient limited by pain   Behavior During Therapy [facility] for tasks assessed/performed      Past Medical History:  Diagnosis Date  . Anxiety   . Colon polyps   . Degenerative arthritis   . Depression   . Diabetes mellitus without complication (HCC)    Type II  . Environmental allergies   . Family history of adverse reaction to anesthesia    sister- PONV  . Fatty liver   . Glaucoma   . Headache(784.0)    HX  MIGRAINES  . History of bronchitis   . History of kidney stones   . History of pneumonia   . Hypertension   . Hypothyroidism   . Obesity   . OSA on CPAP   . Plantar fasciitis    right  . PONV (postoperative nausea and vomiting)   . Renal calculi   . Renal calculi   . RLS (restless legs syndrome) 08/23/2014    Past Surgical History:  Procedure Laterality Date  . ABDOMINAL HYSTERECTOMY     partial  . APPENDECTOMY    . Arthroscopic surgery, knee Left   . BREAST BIOPSY Right    several  . BUNIONECTOMY     LEFT   . COLONOSCOPY W/ POLYPECTOMY    . FOOT SURGERY     RIGHT    . KNEE ARTHROSCOPY W/ MENISCAL REPAIR Left   . LASIK    . MAXIMUM ACCESS (MAS)POSTERIOR LUMBAR INTERBODY FUSION (PLIF) 1 LEVEL N/A 04/25/2016   Procedure: LUMBAR FOUR-FIVE  MAXIMUM ACCESS (MAS) POSTERIOR LUMBAR INTERBODY FUSION (PLIF) with extension of  instrumentation LUMBAR TWO-FIVE;  Surgeon: Tia Alert, MD;  Location: [facility] OR;  Service: Neurosurgery;  Laterality: N/A;  . MAXIMUM ACCESS (MAS)POSTERIOR LUMBAR INTERBODY FUSION (PLIF) 2 LEVEL N/A 12/13/2015   Procedure: Lumbar two-three - Lumbar three-four MAXIMUM ACCESS (MAS) POSTERIOR LUMBAR INTERBODY FUSION (PLIF)  ;  Surgeon: Tia Alert, MD;  Location: [facility] NEURO ORS;  Service: Neurosurgery;  Laterality: N/A;  . SHOULDER SURGERY     RIGHT   . TONSILLECTOMY      There were no vitals filed for this visit.      Subjective Assessment - 10/09/16 1523    Subjective Patient reports she is sore in right side lower back and is having more migraine symptoms and has been baking for the past few days.    Pertinent History Patient reports progressive worsening back and leg pain over the past year and a half. She had surgery 12/13/2015 for fusion L2-3, L3-4 and then increased pain with radiculopathy in left LE with subsequent surgery PLIF L4-5 04/25/2016. Since surgery she has been gradually increasing activity.    Limitations Sitting;Standing;Walking;House hold activities   How long can you sit comfortably? 30 min.   How long  can you stand comfortably? 10 min.   How long can you walk comfortably? 30 min.   Patient Stated Goals to return to full activity without difficulty   Currently in Pain? Yes   Pain Score 2    Pain Location Back   Pain Orientation Lower   Pain Descriptors / Indicators Aching;Sore   Pain Type Chronic pain   Pain Onset More than a month ago   Pain Frequency Intermittent      Objective Gait: slow cadence, antalgic gait pattern with left LE Cam walker in place.   Treatment:  Therapeutic exercise:patient performed with tactile, verbal cues and demonstration of therapist; goal: improve strength, independent with home program Sitting: Bilateral scapular retraction with blue resistive band x 20 reps with assistance of therapist scapular retraction with doubled blue  resistive tubing single armx 15 reps with assist of therapist and VC for correct technique and alignment Straight arm pull down with blue resistive band with assist of therapist x 15 reps Palloff press with blue resistive band x 15 reps Ball under right LE with 3# weight on ankle, roll back and forth x 25 Hip adduction with ball and glute sets x 15 reps Hip abduction with blue resistive band x 20 reps Hip flexion with green resistive band x 10  reps right LE, no resistance left LE Knee flexion with green resistive band x 15 reps each LE  Patient response to treatment: patient required minimal VC for good technique and proper alignment of each LE for most exercises. Limited exercises due to patient not feeling as well today and having headache. No increased pain reported throughout session.        PT Education - 10/09/16 1525    Education provided Yes   Education Details continue with home program 1-2x/week outside of therapy   Person(s) Educated Patient   Methods Explanation   Comprehension Verbalized understanding             PT Long Term Goals - 09/22/16 1430      PT LONG TERM GOAL #1   Title patient will demonstrate improved function with decreased back pain with MODI score of 20% or less by 11/03/2016   Baseline MODI 34% (moderate self perceived disability) MODI 45% impaired 07/17/2016; 38% 08/08/2016; 30% 09/22/2016    Status Revised     PT LONG TERM GOAL #2   Title Patient  will demonstrate good knowledge of posture and body mechanics for back and be able to stand and walk > 1200' with 6 min. walk test by 11/03/2016 demonstrating improved endurance for community activities   Baseline 08/05/2016 950' with back pain : new injury to left foot prevents re assessment   Status Revised     PT LONG TERM GOAL #3   Title Improve  LEFS score to 50/80 or better  indicating improved function with daily tasks, improved LE strength by 11/03/2016   Baseline LEFs 30.5/80 08/08/2016; new injury  to left foot prevents re accurate re assessment   Status Revised     PT LONG TERM GOAL #4   Title Patient will be independent with home exercises without cuing for core control, strengthening to allow self management once discharged from physical therapy 11/03/16   Baseline limited knowledge and moderate VC required for guided exercises and progression    Status Revised               Plan - 10/09/16 1527    Clinical Impression Statement Patient is progressing slowly  and continues with decreased strength and endurance. She will benefit from additional physical therapy intervention to achieve goals and improve function with daily tasks as she heals from left foot injury and back surgery.     Rehab Potential Good   Clinical Impairments Affecting Rehab Potential (+) motivated (-) chronic condition of pain in hip/low back; multiple surgeries on spine within 6 months   PT Frequency 2x / week   PT Duration 6 weeks   PT Treatment/Interventions Manual techniques;Electrical Stimulation;Moist Heat;Patient/family education;Cryotherapy;Therapeutic exercise;Dry needling;Ultrasound   PT Next Visit Plan pain control, progress exercises as tolerated; stabilization exercises   PT Home Exercise Plan home program for stabilization and strengthening exercises       Patient will benefit from skilled therapeutic intervention in order to improve the following deficits and impairments:  Pain, Impaired flexibility, Difficulty walking, Increased muscle spasms, Decreased strength, Impaired perceived functional ability, Decreased activity tolerance, Decreased range of motion  Visit Diagnosis: Bilateral low back pain without sciatica, unspecified chronicity  Muscle weakness (generalized)  Difficulty in walking, not elsewhere classified       G-Codes - 10-18-16 1640    Functional Assessment Tool Used (Outpatient Only) pain scale, clinical judgment, modified oswestry, strength deficits, ROM, LEFS    Functional Limitation Mobility: Walking and moving around   Mobility: Walking and Moving Around Current Status ([id number]) At least 40 percent but less than 60 percent impaired, limited or restricted   Mobility: Walking and Moving Around Goal Status [phone]) At least 20 percent but less than 40 percent impaired, limited or restricted      Problem List Patient Active Problem List   Diagnosis Date Noted  . S/P lumbar spinal fusion 12/13/2015  . Controlled type 2 diabetes mellitus without complication (HCC) 05/16/2015  . Thrombocytopenia (HCC) 05/16/2015  . Recurrent major depressive disorder, in full remission (HCC) 05/16/2015  . Essential (primary) hypertension 05/16/2015  . Fatty infiltration of liver 03/15/2015  . Aortic valve stenosis, nonrheumatic 01/18/2015  . Sciatica of right side 01/09/2015  . Type 2 diabetes mellitus (HCC) 11/10/2014  . RLS (restless legs syndrome) 08/23/2014  . Neuritis or radiculitis due to rupture of lumbar intervertebral disc 06/13/2014  . Degeneration of intervertebral disc of lumbar region 06/13/2014  . Arthritis 01/23/2014  . Arthritis of knee, degenerative 11/15/2013  . Depression 09/29/2013  . Insomnia 09/29/2013  . Apnea, sleep 08/29/2013  . Calculus of kidney 08/29/2013  . Abnormal presence of protein in urine 08/29/2013  . Headache, migraine 08/29/2013  . BP (high blood pressure) 08/29/2013  . HLD (hyperlipidemia) 08/29/2013  . Allergic rhinitis 08/29/2013  . Intractable migraine without aura 02/18/2013    Beacher May PT 10/10/2016, 4:42 PM  [place] [facility] PHYSICAL AND SPORTS MEDICINE [address], [place], [address] Phone: [phone]   Fax:  [phone]  Name: Jessica Cole MRN: [id number] Date of Birth: 18-Nov-1950

## 2016-10-10 NOTE — Telephone Encounter (Signed)
I called the patient. No answer. I'm amendable to prescribing prednisone Dosepak however and in herallergies its listed that she allergic to dexamethasone. I wanted to ensure that the patient is able to tolerate prednisone. Please ask the patient is if she calls back. If she is able to tolerate prednisone I will send in a prescription.

## 2016-10-10 NOTE — Telephone Encounter (Signed)
Prednisone dosepak sent in. (patient has had this before). Trazodone refilled.

## 2016-10-10 NOTE — Addendum Note (Signed)
Addended by: Butch PennyMILLIKAN, Eily Louvier on: 10/10/2016 11:04 AM   Modules accepted: Orders

## 2016-10-13 ENCOUNTER — Ambulatory Visit: Payer: Medicare HMO | Admitting: Physical Therapy

## 2016-10-13 ENCOUNTER — Encounter: Payer: Self-pay | Admitting: Physical Therapy

## 2016-10-13 DIAGNOSIS — M545 Low back pain, unspecified: Secondary | ICD-10-CM

## 2016-10-13 DIAGNOSIS — M6281 Muscle weakness (generalized): Secondary | ICD-10-CM

## 2016-10-13 NOTE — Therapy (Signed)
[place] [facility] PHYSICAL [facility] SPORTS MEDICINE [address] , [place], [id number] Phone: [phone]   Fax:  [phone]  Physical Therapy Treatment  Patient Details  Name: Jessica Cole MRN: [id number] Date of Birth: [birth year] Referring Provider: Tia AlertJones, David S MD  Encounter Date: 10/13/2016      PT End of Session - 10/13/16 1347    Visit Number 21   Number of Visits 27   Date for PT Re-Evaluation 11/03/16   Authorization Type 21   Authorization Time Period 20 G code   PT Start Time 1300   PT Stop Time 1343   PT Time Calculation (min) 43 min   Activity Tolerance Patient tolerated treatment well;Patient limited by pain   [facility] for tasks assessed/performed      Past Medical History:  Diagnosis Date  . Anxiety   . Colon polyps   . Degenerative arthritis   . Depression   . Diabetes mellitus without complication (HCC)    Type II  . Environmental allergies   . Family history of adverse reaction to anesthesia    sister- PONV  . Fatty liver   . Glaucoma   . Headache(784.0)    HX  MIGRAINES  . History of bronchitis   . History of kidney stones   . History of pneumonia   . Hypertension   . Hypothyroidism   . Obesity   . OSA on CPAP   . Plantar fasciitis    right  . PONV (postoperative nausea and vomiting)   . Renal calculi   . Renal calculi   . RLS (restless legs syndrome) 08/23/2014    Past Surgical History:  Procedure Laterality Date  . ABDOMINAL HYSTERECTOMY     partial  . APPENDECTOMY    . Arthroscopic surgery, knee Left   . BREAST BIOPSY Right    several  . BUNIONECTOMY     LEFT   . COLONOSCOPY W/ POLYPECTOMY    . FOOT SURGERY     RIGHT    . KNEE ARTHROSCOPY W/ MENISCAL REPAIR Left   . LASIK    . MAXIMUM ACCESS (MAS)POSTERIOR LUMBAR INTERBODY FUSION (PLIF) 1 LEVEL N/A 04/25/2016   Procedure: LUMBAR FOUR-FIVE  MAXIMUM ACCESS (MAS) POSTERIOR LUMBAR INTERBODY FUSION (PLIF) with extension of  instrumentation LUMBAR TWO-FIVE;  Surgeon: Tia Alertavid S Jones, MD;  [facility];  Service: Neurosurgery;  Laterality: N/A;  . MAXIMUM ACCESS (MAS)POSTERIOR LUMBAR INTERBODY FUSION (PLIF) 2 LEVEL N/A 12/13/2015   Procedure: Lumbar two-three - Lumbar three-four MAXIMUM ACCESS (MAS) POSTERIOR LUMBAR INTERBODY FUSION (PLIF)  ;  Surgeon: Tia Alertavid S Jones, MD;  [facility];  Service: Neurosurgery;  Laterality: N/A;  . SHOULDER SURGERY     RIGHT   . TONSILLECTOMY      There were no vitals filed for this visit.      Subjective Assessment - 10/13/16 1304    Subjective Patient reports she is sore in right side lower back today and is still walking in Cam boot left LE.    Pertinent History Patient reports progressive worsening back and leg pain over the past year and a half. She had surgery 12/13/2015 for fusion L2-3, L3-4 and then increased pain with radiculopathy in left LE with subsequent surgery PLIF L4-5 04/25/2016. Since surgery she has been gradually increasing activity.    Limitations Sitting;Standing;Walking;House hold activities   How long can you sit comfortably? 30 min.   How long can you stand comfortably? 10  min.   How long can you walk comfortably? 30 min.   Patient Stated Goals to return to full activity without difficulty   Currently in Pain? Yes   Pain Score < 1    Pain Location Back   Pain Orientation Right;Lower   Pain Descriptors / Indicators Aching;Sore   Pain Type Chronic pain   Pain Onset More than a month ago   Pain Frequency Intermittent      Objective Gait: slow cadence, antalgic gait pattern with left LE Cam walker in place.   Treatment:  Therapeutic exercise:patient performed with tactile, verbal cues and demonstration of therapist; goal: improve strength, independent with home program Sitting: Bilateral scapular retraction with blue resistive band x 20 reps with assistance of therapist scapular retraction with doubled blueresistive tubing single armx  15 reps with assist of therapist and VC for correct technique and alignment Straight arm pull down with blueresistive band with assist of therapist x 15reps Palloff press with blue and greenresistive band x 20 reps Ball under right LE with 3# weight on ankle, roll back and forth x 25 Knee extension 3# cuff weight on ankle right LE 2 x 15 Hip adduction with ball and glute sets x 20 reps Hip abduction with blueresistive band x 20reps Hip flexion with greenresistive band x 15 reps right LE, no resistance left LE Knee flexion with greenresistive band 2 x 15 reps rightLE  Patient response to treatment: patient demonstrated improved technique and improved endurance with exercises with minimal VC for correct alignment and no increased pain in back or LE's throughout session.          PT Education - 10/13/16 1346    Education provided Yes   Education Details posture and body mechanics for standing activities to avoid increased back strain. height of work table    [employer]) Educated Patient   Methods Explanation;Demonstration   Comprehension Verbalized understanding             PT Long Term Goals - 09/22/16 1430      PT LONG TERM GOAL #1   Title patient will demonstrate improved function with decreased back pain with MODI score of 20% or less by 11/03/2016   Baseline MODI 34% (moderate self perceived disability) MODI 45% impaired 07/17/2016; 38% 08/08/2016; 30% 09/22/2016    Status Revised     PT LONG TERM GOAL #2   Title Patient  will demonstrate good knowledge of posture and body mechanics for back and be able to stand and walk > 1200' with 6 min. walk test by 11/03/2016 demonstrating improved endurance for community activities   Baseline 08/05/2016 950' with back pain : new injury to left foot prevents re assessment   Status Revised     PT LONG TERM GOAL #3   Title Improve  LEFS score to 50/80 or better  indicating improved function with daily tasks, improved LE strength by  11/03/2016   Baseline LEFs 30.5/80 08/08/2016; new injury to left foot prevents re accurate re assessment   Status Revised     PT LONG TERM GOAL #4   Title Patient will be independent with home exercises without cuing for core control, strengthening to allow self management once discharged from physical therapy 11/03/16   Baseline limited knowledge and moderate VC required for guided exercises and progression    Status Revised               Plan - 10/13/16 1347    Clinical Impression Statement Patient demonstrates  improving strength and endurance with exercises as indicated by increased repetitions without increased pain reported and mild fatigue noted. Walking in Cam walker on left LE contributes to abnormal gait pattern and may be contributing to continued right lower back symptoms. She will continue to benefit from physical therapy intervention as she heals from surgery and to improve strength and endurance in order to achieve goals.    Rehab Potential Good   Clinical Impairments Affecting Rehab Potential (+) motivated (-) chronic condition of pain in hip/low back; multiple surgeries on spine within 6 months   PT Frequency 2x / week   PT Duration 6 weeks   PT Treatment/Interventions Manual techniques;Electrical Stimulation;Moist Heat;Patient/family education;Cryotherapy;Therapeutic exercise;Dry needling;Ultrasound   PT Next Visit Plan pain control, progress exercises as tolerated; stabilization exercises; cable weighted exercises next session   PT Home Exercise Plan home program for stabilization and strengthening exercises       Patient will benefit from skilled therapeutic intervention in order to improve the following deficits and impairments:  Pain, Impaired flexibility, Difficulty walking, Increased muscle spasms, Decreased strength, Impaired perceived functional ability, Decreased activity tolerance, Decreased range of motion  Visit Diagnosis: Bilateral low back pain without  sciatica, unspecified chronicity  Muscle weakness (generalized)     Problem List Patient Active Problem List   Diagnosis Date Noted  . S/P lumbar spinal fusion 12/13/2015  . Controlled type 2 diabetes mellitus without complication (HCC) 05/16/2015  . Thrombocytopenia (HCC) 05/16/2015  . Recurrent major depressive disorder, in full remission (HCC) 05/16/2015  . Essential (primary) hypertension 05/16/2015  . Fatty infiltration of liver 03/15/2015  . Aortic valve stenosis, nonrheumatic 01/18/2015  . Sciatica of right side 01/09/2015  . Type 2 diabetes mellitus (HCC) 11/10/2014  . RLS (restless legs syndrome) 08/23/2014  . Neuritis or radiculitis due to rupture of lumbar intervertebral disc 06/13/2014  . Degeneration of intervertebral disc of lumbar region 06/13/2014  . Arthritis 01/23/2014  . Arthritis of knee, degenerative 11/15/2013  . Depression 09/29/2013  . Insomnia 09/29/2013  . Apnea, sleep 08/29/2013  . Calculus of kidney 08/29/2013  . Abnormal presence of protein in urine 08/29/2013  . Headache, migraine 08/29/2013  . BP (high blood pressure) 08/29/2013  . HLD (hyperlipidemia) 08/29/2013  . Allergic rhinitis 08/29/2013  . Intractable migraine without aura 02/18/2013    Beacher May PT 10/13/2016, 1:50 PM  [facility] PHYSICAL AND SPORTS MEDICINE [address] [place], [address] Phone: [phone]   Fax:  [phone]  Name: Jessica Cole MRN: [id number] Date of Birth: 1951/04/26

## 2016-10-15 ENCOUNTER — Encounter: Payer: Self-pay | Admitting: Podiatry

## 2016-10-15 ENCOUNTER — Ambulatory Visit: Payer: Medicare HMO | Admitting: Physical Therapy

## 2016-10-15 ENCOUNTER — Encounter: Payer: Self-pay | Admitting: Physical Therapy

## 2016-10-15 ENCOUNTER — Ambulatory Visit (INDEPENDENT_AMBULATORY_CARE_PROVIDER_SITE_OTHER): Payer: Medicare HMO | Admitting: Podiatry

## 2016-10-15 DIAGNOSIS — S93692A Other sprain of left foot, initial encounter: Secondary | ICD-10-CM

## 2016-10-15 DIAGNOSIS — M545 Low back pain, unspecified: Secondary | ICD-10-CM

## 2016-10-15 DIAGNOSIS — S93692D Other sprain of left foot, subsequent encounter: Secondary | ICD-10-CM

## 2016-10-15 DIAGNOSIS — M6281 Muscle weakness (generalized): Secondary | ICD-10-CM

## 2016-10-15 DIAGNOSIS — R262 Difficulty in walking, not elsewhere classified: Secondary | ICD-10-CM

## 2016-10-15 NOTE — Progress Notes (Signed)
She presents for follow-up of her plantar fascia tear to her left foot. She states this proximally 80% well.  Objective: Vital signs that was alert and oriented 3 minimal pain on palpation at the tear site of the plantar fascia medial band proximal portion left foot.  Assessment: Well-healing plantar fascia tear left foot.  Plan: Follow up with me as needed.

## 2016-10-15 NOTE — Therapy (Signed)
[place] [facility] PHYSICAL AND SPORTS MEDICINE [address] [place], [place], [address] Phone: [phone]   Fax:  [phone]  Physical Therapy Treatment  Patient Details  Name: Jessica PutnamGail T Cole MRN: [id number] Date of Birth: [birth year] Referring Provider: Tia AlertJones, David S MD  Encounter Date: 10/15/2016      PT End of Session - 10/15/16 1523    Visit Number 22   Number of Visits 27   Date for PT Re-Evaluation 11/03/16   Authorization Type 22   Authorization Time Period 20 G code   PT Start Time 1444   PT Stop Time 1516   PT Time Calculation (min) 32 min   Activity Tolerance Patient tolerated treatment well;Patient limited by fatigue   Behavior [facility] Therapy [facility] for tasks assessed/performed      Past Medical History:  Diagnosis Date  . Anxiety   . Colon polyps   . Degenerative arthritis   . Depression   . Diabetes mellitus without complication (HCC)    Type II  . Environmental allergies   . Family history of adverse reaction to anesthesia    sister- PONV  . Fatty liver   . Glaucoma   . Headache(784.0)    HX  MIGRAINES  . History of bronchitis   . History of kidney stones   . History of pneumonia   . Hypertension   . Hypothyroidism   . Obesity   . OSA on CPAP   . Plantar fasciitis    right  . PONV (postoperative nausea and vomiting)   . Renal calculi   . Renal calculi   . RLS (restless legs syndrome) 08/23/2014    Past Surgical History:  Procedure Laterality Date  . ABDOMINAL HYSTERECTOMY     partial  . APPENDECTOMY    . Arthroscopic surgery, knee Left   . BREAST BIOPSY Right    several  . BUNIONECTOMY     LEFT   . COLONOSCOPY W/ POLYPECTOMY    . FOOT SURGERY     RIGHT    . KNEE ARTHROSCOPY W/ MENISCAL REPAIR Left   . LASIK    . MAXIMUM ACCESS (MAS)POSTERIOR LUMBAR INTERBODY FUSION (PLIF) 1 LEVEL N/A 04/25/2016   Procedure: LUMBAR FOUR-FIVE  MAXIMUM ACCESS (MAS) POSTERIOR LUMBAR INTERBODY FUSION (PLIF) with extension  of instrumentation LUMBAR TWO-FIVE;  Surgeon: Tia Alertavid S Jones, MD;  [facility];  Service: Neurosurgery;  Laterality: N/A;  . MAXIMUM ACCESS (MAS)POSTERIOR LUMBAR INTERBODY FUSION (PLIF) 2 LEVEL N/A 12/13/2015   Procedure: Lumbar two-three - Lumbar three-four MAXIMUM ACCESS (MAS) POSTERIOR LUMBAR INTERBODY FUSION (PLIF)  ;  Surgeon: Tia Alertavid S Jones, MD;  [facility];  Service: Neurosurgery;  Laterality: N/A;  . SHOULDER SURGERY     RIGHT   . TONSILLECTOMY      There were no vitals filed for this visit.      Subjective Assessment - 10/15/16 1445    Subjective Patient reports she is now not having to wear Cam walker unless she feels she needs to. She reports twinges in back intermittently. She is arriving late to therapy    Pertinent History Patient reports progressive worsening back and leg pain over the past year and a half. She had surgery 12/13/2015 for fusion L2-3, L3-4 and then increased pain with radiculopathy in left LE with subsequent surgery PLIF L4-5 04/25/2016. Since surgery she has been gradually increasing activity.    Limitations Sitting;Standing;Walking;House hold activities   How long can you sit comfortably? 30 min.  How long can you stand comfortably? 10 min.   How long can you walk comfortably? 30 min.   Patient Stated Goals to return to full activity without difficulty   Currently in Pain? Yes   Pain Score 1    Pain Location Back   Pain Orientation Right;Lower   Pain Descriptors / Indicators Aching   Pain Type Chronic pain   Pain Onset More than a month ago   Pain Frequency Intermittent           Objective:      Gait: ambulating independently without Cam walker  Treatment:  Therapeutic exercise:patient performed with tactile, verbal cues and demonstration of therapist; goal: improve strength, independent with home program Standing: On balance pad: weight shift side to side and forward and back with VC and controlled motion x 2 min. Each direction On  airex balance beam:  Side step to right 3-4 steps and to the left x 3-4 reps x 2 min. Lateral step ups with tapping heel and then back to floor x 10 reps leading with each LE Forward step ups with increased speed x 10 reps leading with each LE  Patient response to treatment: Patient demonstrated mild fatigue with exercises and was able to complete exercises with moderate VC and demonstration.       PT Education - 10/15/16 1520    Education provided Yes   Education Details instruction for exercises on balance beam and balance pad   Person(s) Educated Patient   Methods Explanation;Demonstration;Verbal cues   Comprehension Verbalized understanding;Returned demonstration;Verbal cues required             PT Long Term Goals - 09/22/16 1430      PT LONG TERM GOAL #1   Title patient will demonstrate improved function with decreased back pain with MODI score of 20% or less by 11/03/2016   Baseline MODI 34% (moderate self perceived disability) MODI 45% impaired 07/17/2016; 38% 08/08/2016; 30% 09/22/2016    Status Revised     PT LONG TERM GOAL #2   Title Patient  will demonstrate good knowledge of posture and body mechanics for back and be able to stand and walk > 1200' with 6 min. walk test by 11/03/2016 demonstrating improved endurance for community activities   Baseline 08/05/2016 950' with back pain : new injury to left foot prevents re assessment   Status Revised     PT LONG TERM GOAL #3   Title Improve  LEFS score to 50/80 or better  indicating improved function with daily tasks, improved LE strength by 11/03/2016   Baseline LEFs 30.5/80 08/08/2016; new injury to left foot prevents re accurate re assessment   Status Revised     PT LONG TERM GOAL #4   Title Patient will be independent with home exercises without cuing for core control, strengthening to allow self management once discharged from physical therapy 11/03/16   Baseline limited knowledge and moderate VC required for guided exercises  and progression    Status Revised               Plan - 10/15/16 1523    Clinical Impression Statement Patient is now able to progress with standing, balancing exercises following discontinuing brace on left ankle. She demonstrates decreased endurance with walking and balancing exercises and should continue to progress with additional physical therapy intervention.    Rehab Potential Good   Clinical Impairments Affecting Rehab Potential (+) motivated (-) chronic condition of pain in hip/low back; multiple surgeries on  spine within 6 months   PT Frequency 2x / week   PT Duration 6 weeks   PT Treatment/Interventions Manual techniques;Electrical Stimulation;Moist Heat;Patient/family education;Cryotherapy;Therapeutic exercise;Dry needling;Ultrasound   PT Next Visit Plan pain control, progress exercises as tolerated; stabilization exercises   PT Home Exercise Plan home program for stabilization and strengthening exercises       Patient will benefit from skilled therapeutic intervention in order to improve the following deficits and impairments:  Pain, Impaired flexibility, Difficulty walking, Increased muscle spasms, Decreased strength, Impaired perceived functional ability, Decreased activity tolerance, Decreased range of motion  Visit Diagnosis: Bilateral low back pain without sciatica, unspecified chronicity  Muscle weakness (generalized)  Difficulty in walking, not elsewhere classified     Problem List Patient Active Problem List   Diagnosis Date Noted  . S/P lumbar spinal fusion 12/13/2015  . Controlled type 2 diabetes mellitus without complication (HCC) 05/16/2015  . Thrombocytopenia (HCC) 05/16/2015  . Recurrent major depressive disorder, in full remission (HCC) 05/16/2015  . Essential (primary) hypertension 05/16/2015  . Fatty infiltration of liver 03/15/2015  . Aortic valve stenosis, nonrheumatic 01/18/2015  . Sciatica of right side 01/09/2015  . Type 2 diabetes  mellitus (HCC) 11/10/2014  . RLS (restless legs syndrome) 08/23/2014  . Neuritis or radiculitis due to rupture of lumbar intervertebral disc 06/13/2014  . Degeneration of intervertebral disc of lumbar region 06/13/2014  . Arthritis 01/23/2014  . Arthritis of knee, degenerative 11/15/2013  . Depression 09/29/2013  . Insomnia 09/29/2013  . Apnea, sleep 08/29/2013  . Calculus of kidney 08/29/2013  . Abnormal presence of protein in urine 08/29/2013  . Headache, migraine 08/29/2013  . BP (high blood pressure) 08/29/2013  . HLD (hyperlipidemia) 08/29/2013  . Allergic rhinitis 08/29/2013  . Intractable migraine without aura 02/18/2013    Beacher May PT 10/15/2016, 3:26 PM  [facility] PHYSICAL AND SPORTS MEDICINE [address], [place], [address] Phone: [phone]   Fax:  [phone]  Name: JOSAPHINE SHIMAMOTO MRN: [id number] Date of Birth: [birth year]

## 2016-10-20 ENCOUNTER — Encounter: Payer: Self-pay | Admitting: Physical Therapy

## 2016-10-20 ENCOUNTER — Ambulatory Visit: Payer: Medicare HMO | Admitting: Physical Therapy

## 2016-10-20 DIAGNOSIS — M545 Low back pain, unspecified: Secondary | ICD-10-CM

## 2016-10-20 DIAGNOSIS — M6281 Muscle weakness (generalized): Secondary | ICD-10-CM

## 2016-10-20 NOTE — Therapy (Signed)
[place] [facility]ORTS MEDICINE [address] [place], [address] Phone: [phone]   Fax:  [phone]  Physical Therapy Treatment  Patient Details  Name: Jessica Cole MRN: [id number] Date of Birth: February 08, 1951 Referring Provider: Tia Alert MD  Encounter Date: 10/20/2016      PT End of Session - 10/20/16 1359    Visit Number 23   Number of Visits 27   Date for PT Re-Evaluation 11/03/16   Authorization Type 23   Authorization Time Period 20 G code   PT Start Time 1258   PT Stop Time 1345   PT Time Calculation (min) 47 min   Activity Tolerance Patient tolerated treatment well;Patient limited by pain   Behavior During Therapy [facility] for tasks assessed/performed      Past Medical History:  Diagnosis Date  . Anxiety   . Colon polyps   . Degenerative arthritis   . Depression   . Diabetes mellitus without complication (HCC)    Type II  . Environmental allergies   . Family history of adverse reaction to anesthesia    sister- PONV  . Fatty liver   . Glaucoma   . Headache(784.0)    HX  MIGRAINES  . History of bronchitis   . History of kidney stones   . History of pneumonia   . Hypertension   . Hypothyroidism   . Obesity   . OSA on CPAP   . Plantar fasciitis    right  . PONV (postoperative nausea and vomiting)   . Renal calculi   . Renal calculi   . RLS (restless legs syndrome) 08/23/2014    Past Surgical History:  Procedure Laterality Date  . ABDOMINAL HYSTERECTOMY     partial  . APPENDECTOMY    . Arthroscopic surgery, knee Left   . BREAST BIOPSY Right    several  . BUNIONECTOMY     LEFT   . COLONOSCOPY W/ POLYPECTOMY    . FOOT SURGERY     RIGHT    . KNEE ARTHROSCOPY W/ MENISCAL REPAIR Left   . LASIK    . MAXIMUM ACCESS (MAS)POSTERIOR LUMBAR INTERBODY FUSION (PLIF) 1 LEVEL N/A 04/25/2016   Procedure: LUMBAR FOUR-FIVE  MAXIMUM ACCESS (MAS) POSTERIOR LUMBAR INTERBODY FUSION (PLIF) with extension of  instrumentation LUMBAR TWO-FIVE;  Surgeon: Tia Alert, MD;  Location: [facility] OR;  Service: Neurosurgery;  Laterality: N/A;  . MAXIMUM ACCESS (MAS)POSTERIOR LUMBAR INTERBODY FUSION (PLIF) 2 LEVEL N/A 12/13/2015   Procedure: Lumbar two-three - Lumbar three-four MAXIMUM ACCESS (MAS) POSTERIOR LUMBAR INTERBODY FUSION (PLIF)  ;  Surgeon: Tia Alert, MD;  Location: [facility] NEURO ORS;  Service: Neurosurgery;  Laterality: N/A;  . SHOULDER SURGERY     RIGHT   . TONSILLECTOMY      There were no vitals filed for this visit.      Subjective Assessment - 10/20/16 1300    Subjective Patient reports having increased stress and is having headaches and anxiety symptoms today. she has her brace off of her left LE and is doing well with mobility and strength. She reports she is seeing people again when tired and getting ready to sleep, not while awake during the day, and will discuss this with her MD.  Currently reports mild tightness across chest, improved from yesterday and denies pain, nausea, headache and shortness of breath. .    Pertinent History Patient reports progressive worsening back and leg pain over the past year and a half.  She had surgery 12/13/2015 for fusion L2-3, L3-4 and then increased pain with radiculopathy in left LE with subsequent surgery PLIF L4-5 04/25/2016. Since surgery she has been gradually increasing activity.    Limitations Sitting;Standing;Walking;House hold activities   How long can you sit comfortably? 30 min.   How long can you stand comfortably? 10 min.   How long can you walk comfortably? 30 min.   Patient Stated Goals to return to full activity without difficulty   Currently in Pain? Other (Comment)  no real pain, mostly sorness in back with intermittent spasms           Objective:      Gait: ambulating independently with no obvious deviations, good cadence and weight shifting   Treatment:  Therapeutic exercise:patient performed with tactile, verbal cues and  demonstration of therapist; goal: improve strength, independent with home program Standing: BP prior to exercise: 162/78 (taken in seated position left UE) HR 73 On airex balance beam:  Side step to right 3-4 steps and to the left x 3-4 reps x 2 min. Lateral step ups with tapping heel and then back to floor x 5 reps leading with each LE Forward step ups with increased speed x 10 reps leading with each LE BP decreased to 138/80 following standing exercises(taken in seated position left UE)  Seated: Cable exercises at [employer]; sitting on stability ball with base in place for balance, safety 15# paloff press x 15 15# bilateral scapular retraction x 20 15# straight arm pull downs with guided ROM, short arc x 15 reps 20# reverse pull ups x 15 reps BP following UE exercises: 158/80 (taken in seated position left UE)  Standing running man x 5 reps each side  Forward and backward walk along counter x 2 min. BP; 158/80 (taken in seated position left UE)  Patient response to treatment: Patient demonstrated decreased BP and tightness in chest following initial exercises, BP remained stable throughout exercise session. Mild fatigue noted with exercises. Minimal VC and demonstration required to complete exercises with good technique and alignment.          PT Education - 10/20/16 1305    Education provided Yes   Education Details re assessed HEP   Person(s) Educated Patient   Methods Explanation   Comprehension Verbalized understanding             PT Long Term Goals - 09/22/16 1430      PT LONG TERM GOAL #1   Title patient will demonstrate improved function with decreased back pain with MODI score of 20% or less by 11/03/2016   Baseline MODI 34% (moderate self perceived disability) MODI 45% impaired 07/17/2016; 38% 08/08/2016; 30% 09/22/2016    Status Revised     PT LONG TERM GOAL #2   Title Patient  will demonstrate good knowledge of posture and body mechanics for back and be able to  stand and walk > 1200' with 6 min. walk test by 11/03/2016 demonstrating improved endurance for community activities   Baseline 08/05/2016 950' with back pain : new injury to left foot prevents re assessment   Status Revised     PT LONG TERM GOAL #3   Title Improve  LEFS score to 50/80 or better  indicating improved function with daily tasks, improved LE strength by 11/03/2016   Baseline LEFs 30.5/80 08/08/2016; new injury to left foot prevents re accurate re assessment   Status Revised     PT LONG TERM GOAL #4   Title Patient  will be independent with home exercises without cuing for core control, strengthening to allow self management once discharged from physical therapy 11/03/16   Baseline limited knowledge and moderate VC required for guided exercises and progression    Status Revised               Plan - 10/20/16 1322    Clinical Impression Statement Patient is progressing steadily with improved technique and endurance. She is limited in abiltiy to exercise due to reports of stress and feeling tightness across chest, decreased symptoms following exercises.    Rehab Potential Good   Clinical Impairments Affecting Rehab Potential (+) motivated (-) chronic condition of pain in hip/low back; multiple surgeries on spine within 6 months   PT Frequency 2x / week   PT Duration 6 weeks   PT Treatment/Interventions Manual techniques;Electrical Stimulation;Moist Heat;Patient/family education;Cryotherapy;Therapeutic exercise;Dry needling;Ultrasound   PT Next Visit Plan pain control, progress exercises as tolerated; stabilization exercises   PT Home Exercise Plan home program for stabilization and strengthening exercises       Patient will benefit from skilled therapeutic intervention in order to improve the following deficits and impairments:  Pain, Impaired flexibility, Difficulty walking, Increased muscle spasms, Decreased strength, Impaired perceived functional ability, Decreased activity  tolerance, Decreased range of motion  Visit Diagnosis: Bilateral low back pain without sciatica, unspecified chronicity  Muscle weakness (generalized)     Problem List Patient Active Problem List   Diagnosis Date Noted  . S/P lumbar spinal fusion 12/13/2015  . Controlled type 2 diabetes mellitus without complication (HCC) 05/16/2015  . Thrombocytopenia (HCC) 05/16/2015  . Recurrent major depressive disorder, in full remission (HCC) 05/16/2015  . Essential (primary) hypertension 05/16/2015  . Fatty infiltration of liver 03/15/2015  . Aortic valve stenosis, nonrheumatic 01/18/2015  . Sciatica of right side 01/09/2015  . Type 2 diabetes mellitus (HCC) 11/10/2014  . RLS (restless legs syndrome) 08/23/2014  . Neuritis or radiculitis due to rupture of lumbar intervertebral disc 06/13/2014  . Degeneration of intervertebral disc of lumbar region 06/13/2014  . Arthritis 01/23/2014  . Arthritis of knee, degenerative 11/15/2013  . Depression 09/29/2013  . Insomnia 09/29/2013  . Apnea, sleep 08/29/2013  . Calculus of kidney 08/29/2013  . Abnormal presence of protein in urine 08/29/2013  . Headache, migraine 08/29/2013  . BP (high blood pressure) 08/29/2013  . HLD (hyperlipidemia) 08/29/2013  . Allergic rhinitis 08/29/2013  . Intractable migraine without aura 02/18/2013    Beacher May PT 10/20/2016, 2:02 PM  [place] [facility] PHYSICAL AND SPORTS MEDICINE [address] [place], [address] Phone: [phone]   Fax:  [phone]  Name: Jessica Cole MRN: [id number] Date of Birth: 1950/07/05

## 2016-10-23 ENCOUNTER — Encounter: Payer: Self-pay | Admitting: Physical Therapy

## 2016-10-23 ENCOUNTER — Ambulatory Visit: Payer: Medicare HMO | Admitting: Physical Therapy

## 2016-10-23 DIAGNOSIS — M545 Low back pain, unspecified: Secondary | ICD-10-CM

## 2016-10-23 DIAGNOSIS — M6281 Muscle weakness (generalized): Secondary | ICD-10-CM

## 2016-10-23 NOTE — Therapy (Signed)
[facility] [address] [place], [address] Phone: [phone]   Fax:  [phone]  Physical Therapy Treatment  Patient Details  Name: Jessica Cole MRN: [id number] Date of Birth: 16-Nov-1950 Referring Provider: Tia Alert MD  Encounter Date: 10/23/2016      PT End of Session - 10/23/16 1803    Visit Number 24   Number of Visits 27   Date for PT Re-Evaluation 11/03/16   Authorization Type 24   Authorization Time Period 20 G code   PT Start Time 1258   PT Stop Time 1330   PT Time Calculation (min) 32 min   Activity Tolerance Patient tolerated treatment well;Patient limited by pain   Behavior During Therapy [facility] for tasks assessed/performed      Past Medical History:  Diagnosis Date  . Anxiety   . Colon polyps   . Degenerative arthritis   . Depression   . Diabetes mellitus without complication (HCC)    Type II  . Environmental allergies   . Family history of adverse reaction to anesthesia    sister- PONV  . Fatty liver   . Glaucoma   . Headache(784.0)    HX  MIGRAINES  . History of bronchitis   . History of kidney stones   . History of pneumonia   . Hypertension   . Hypothyroidism   . Obesity   . OSA on CPAP   . Plantar fasciitis    right  . PONV (postoperative nausea and vomiting)   . Renal calculi   . Renal calculi   . RLS (restless legs syndrome) 08/23/2014    Past Surgical History:  Procedure Laterality Date  . ABDOMINAL HYSTERECTOMY     partial  . APPENDECTOMY    . Arthroscopic surgery, knee Left   . BREAST BIOPSY Right    several  . BUNIONECTOMY     LEFT   . COLONOSCOPY W/ POLYPECTOMY    . FOOT SURGERY     RIGHT    . KNEE ARTHROSCOPY W/ MENISCAL REPAIR Left   . LASIK    . MAXIMUM ACCESS (MAS)POSTERIOR LUMBAR INTERBODY FUSION (PLIF) 1 LEVEL N/A 04/25/2016   Procedure: LUMBAR FOUR-FIVE  MAXIMUM ACCESS (MAS) POSTERIOR LUMBAR INTERBODY FUSION (PLIF) with extension of  instrumentation LUMBAR TWO-FIVE;  Surgeon: Tia Alert, MD;  Location: [facility] OR;  Service: Neurosurgery;  Laterality: N/A;  . MAXIMUM ACCESS (MAS)POSTERIOR LUMBAR INTERBODY FUSION (PLIF) 2 LEVEL N/A 12/13/2015   Procedure: Lumbar two-three - Lumbar three-four MAXIMUM ACCESS (MAS) POSTERIOR LUMBAR INTERBODY FUSION (PLIF)  ;  Surgeon: Tia Alert, MD;  Location: [facility] NEURO ORS;  Service: Neurosurgery;  Laterality: N/A;  . SHOULDER SURGERY     RIGHT   . TONSILLECTOMY      There were no vitals filed for this visit.      Subjective Assessment - 10/23/16 1301    Subjective Patient reports she is feeling stressed today due to working at home with husband. She reports having right and left hip catching 3/10 intermittently today.   Pertinent History Patient reports progressive worsening back and leg pain over the past year and a half. She had surgery 12/13/2015 for fusion L2-3, L3-4 and then increased pain with radiculopathy in left LE with subsequent surgery PLIF L4-5 04/25/2016. Since surgery she has been gradually increasing activity.    Limitations Sitting;Standing;Walking;House hold activities   How long can you sit comfortably? 30 min.   How long can  you stand comfortably? 10 min.   How long can you walk comfortably? 30 min.   Patient Stated Goals to return to full activity without difficulty   Currently in Pain? Other (Comment)  soreness in lower back with intermittent spasms in either hip          Objective: Gait: independent, guarded posture, decreased trunk rotation  Treatment:  Therapeutic exercise:patient performed with tactile, verbal cues and demonstration of therapist; goal: improve strength, independent with home program Standing: On airex balance beam:  Side step to right 3-4 steps and to the left x 3-4 reps x 2 min. Step ups onto balance stones leading with each LE x 10 Lateral step up, tap stone and then back down 5x each LE with close supervision and UE support as  needed for balance Seated: Rocker board 1 min. Forward and back and 1 min. Side to side 2# weights for knee extension 2 x 15 Knee flexion with green resistive band 2 x 15-20 reps Rhythmic stabilization with manual resistance given flexion/extension of trunk with patient seated on elevated treatment table 10 reps Cable exercises at [facility]; sitting on stability ball with base in place for balance, safety 15# paloff press x 15 15# bilateral scapular retraction x 20 15# straight arm pull downs with guided ROM, short arc x 15 reps 20# reverse pull ups x 15 reps  Patient response to treatment: Patient demonstrated improved motor control with standing and balance stone exercises with moderate VC and close supervision. Patient with mild fatigue with all exercises. Back pain/hip catching decreased throughout session          PT Education - 10/23/16 1322    Education provided Yes   Education Details exercise technique and proper alignment and posture during exercises   Person(s) Educated Patient   Methods Explanation;Demonstration;Verbal cues   Comprehension Verbalized understanding;Returned demonstration;Verbal cues required             PT Long Term Goals - 09/22/16 1430      PT LONG TERM GOAL #1   Title patient will demonstrate improved function with decreased back pain with MODI score of 20% or less by 11/03/2016   Baseline MODI 34% (moderate self perceived disability) MODI 45% impaired 07/17/2016; 38% 08/08/2016; 30% 09/22/2016    Status Revised     PT LONG TERM GOAL #2   Title Patient  will demonstrate good knowledge of posture and body mechanics for back and be able to stand and walk > 1200' with 6 min. walk test by 11/03/2016 demonstrating improved endurance for community activities   Baseline 08/05/2016 950' with back pain : new injury to left foot prevents re assessment   Status Revised     PT LONG TERM GOAL #3   Title Improve  LEFS score to 50/80 or better  indicating improved  function with daily tasks, improved LE strength by 11/03/2016   Baseline LEFs 30.5/80 08/08/2016; new injury to left foot prevents re accurate re assessment   Status Revised     PT LONG TERM GOAL #4   Title Patient will be independent with home exercises without cuing for core control, strengthening to allow self management once discharged from physical therapy 11/03/16   Baseline limited knowledge and moderate VC required for guided exercises and progression    Status Revised               Plan - 10/23/16 1804    Clinical Impression Statement Patient is progressing slowly and steadily towards goals  due to chronic back pain and continued reports of feeling stressed with personal life. She should continue to progress towards independent home program with additional physical therapy intervention.     Rehab Potential Good   Clinical Impairments Affecting Rehab Potential (+) motivated (-) chronic condition of pain in hip/low back; multiple surgeries on spine within 6 months   PT Frequency 2x / week   PT Duration 6 weeks   PT Treatment/Interventions Manual techniques;Electrical Stimulation;Moist Heat;Patient/family education;Cryotherapy;Therapeutic exercise;Dry needling;Ultrasound   PT Next Visit Plan pain control, progress exercises as tolerated; stabilization exercises   PT Home Exercise Plan home program for stabilization and strengthening exercises       Patient will benefit from skilled therapeutic intervention in order to improve the following deficits and impairments:  Pain, Impaired flexibility, Difficulty walking, Increased muscle spasms, Decreased strength, Impaired perceived functional ability, Decreased activity tolerance, Decreased range of motion  Visit Diagnosis: Muscle weakness (generalized)  Bilateral low back pain without sciatica, unspecified chronicity     Problem List Patient Active Problem List   Diagnosis Date Noted  . S/P lumbar spinal fusion 12/13/2015  .  Controlled type 2 diabetes mellitus without complication (HCC) 05/16/2015  . Thrombocytopenia (HCC) 05/16/2015  . Recurrent major depressive disorder, in full remission (HCC) 05/16/2015  . Essential (primary) hypertension 05/16/2015  . Fatty infiltration of liver 03/15/2015  . Aortic valve stenosis, nonrheumatic 01/18/2015  . Sciatica of right side 01/09/2015  . Type 2 diabetes mellitus (HCC) 11/10/2014  . RLS (restless legs syndrome) 08/23/2014  . Neuritis or radiculitis due to rupture of lumbar intervertebral disc 06/13/2014  . Degeneration of intervertebral disc of lumbar region 06/13/2014  . Arthritis 01/23/2014  . Arthritis of knee, degenerative 11/15/2013  . Depression 09/29/2013  . Insomnia 09/29/2013  . Apnea, sleep 08/29/2013  . Calculus of kidney 08/29/2013  . Abnormal presence of protein in urine 08/29/2013  . Headache, migraine 08/29/2013  . BP (high blood pressure) 08/29/2013  . HLD (hyperlipidemia) 08/29/2013  . Allergic rhinitis 08/29/2013  . Intractable migraine without aura 02/18/2013    Beacher MayBrooks, Aldea Avis PT 10/23/2016, 6:09 PM   [facility] [address] [place], [place], [id number] Phone: [phone]   Fax:  [phone]  Name: Jessica Cole MRN: [id number] Date of Birth: [birth year]

## 2016-10-27 ENCOUNTER — Ambulatory Visit: Payer: Medicare HMO | Admitting: Physical Therapy

## 2016-10-27 DIAGNOSIS — M545 Low back pain, unspecified: Secondary | ICD-10-CM

## 2016-10-27 DIAGNOSIS — M6281 Muscle weakness (generalized): Secondary | ICD-10-CM

## 2016-10-27 NOTE — Therapy (Signed)
[facility]ICINE [address] [place], [address] Phone: [phone]   Fax:  [phone]  Physical Therapy Treatment  Patient Details  Name: Jessica Cole MRN: [id number] Date of Birth: [birth year] Referring Provider: Tia Alert MD  Encounter Date: 10/27/2016      PT End of Session - 10/27/16 1302    Visit Number 25   Number of Visits 27   Date for PT Re-Evaluation 11/03/16   Authorization Type 25   Authorization Time Period 20 G code   PT Start Time 1259   PT Stop Time 1345   PT Time Calculation (min) 46 min   Activity Tolerance Patient tolerated treatment well;Patient limited by pain   Behavior During Therapy [facility] for tasks assessed/performed      Past Medical History:  Diagnosis Date  . Anxiety   . Colon polyps   . Degenerative arthritis   . Depression   . Diabetes mellitus without complication (HCC)    Type II  . Environmental allergies   . Family history of adverse reaction to anesthesia    sister- PONV  . Fatty liver   . Glaucoma   . Headache(784.0)    HX  MIGRAINES  . History of bronchitis   . History of kidney stones   . History of pneumonia   . Hypertension   . Hypothyroidism   . Obesity   . OSA on CPAP   . Plantar fasciitis    right  . PONV (postoperative nausea and vomiting)   . Renal calculi   . Renal calculi   . RLS (restless legs syndrome) 08/23/2014    Past Surgical History:  Procedure Laterality Date  . ABDOMINAL HYSTERECTOMY     partial  . APPENDECTOMY    . Arthroscopic surgery, knee Left   . BREAST BIOPSY Right    several  . BUNIONECTOMY     LEFT   . COLONOSCOPY W/ POLYPECTOMY    . FOOT SURGERY     RIGHT    . KNEE ARTHROSCOPY W/ MENISCAL REPAIR Left   . LASIK    . MAXIMUM ACCESS (MAS)POSTERIOR LUMBAR INTERBODY FUSION (PLIF) 1 LEVEL N/A 04/25/2016   Procedure: LUMBAR FOUR-FIVE  MAXIMUM ACCESS (MAS) POSTERIOR LUMBAR INTERBODY FUSION (PLIF) with extension of  instrumentation LUMBAR TWO-FIVE;  Surgeon: Tia Alert, MD;  Location: [facility] OR;  Service: Neurosurgery;  Laterality: N/A;  . MAXIMUM ACCESS (MAS)POSTERIOR LUMBAR INTERBODY FUSION (PLIF) 2 LEVEL N/A 12/13/2015   Procedure: Lumbar two-three - Lumbar three-four MAXIMUM ACCESS (MAS) POSTERIOR LUMBAR INTERBODY FUSION (PLIF)  ;  Surgeon: Tia Alert, MD;  Location: [facility] NEURO ORS;  Service: Neurosurgery;  Laterality: N/A;  . SHOULDER SURGERY     RIGHT   . TONSILLECTOMY      There were no vitals filed for this visit.      Subjective Assessment - 10/27/16 1300    Subjective Patient reports she is tired today and having allergy problems with sinus congestion and not as stressed today.    Pertinent History Patient reports progressive worsening back and leg pain over the past year and a half. She had surgery 12/13/2015 for fusion L2-3, L3-4 and then increased pain with radiculopathy in left LE with subsequent surgery PLIF L4-5 04/25/2016. Since surgery she has been gradually increasing activity.    Limitations Sitting;Standing;Walking;House hold activities   How long can you sit comfortably? 30 min.   How long can you stand comfortably? 10 min.  How long can you walk comfortably? 30 min.   Patient Stated Goals to return to full activity without difficulty   Currently in Pain? Other (Comment)  discomfort in lower back            Objective: Gait: guarded posture, decreased trunk rotation, intermittent spasms in lower back reported  Treatment:  Therapeutic exercise:patient performed with tactile, verbal cues and demonstration of therapist; goal: improve strength, independent with home program Standing: On airex balance beam:  Side step to right 3-4 steps and to the left x 3-4 reps x 2 min. Step ups onto balance stones leading with each LE x 10 Lateral step up, tap balance beam and then back down 8x each LE with close supervision and UE support as needed for balance Seated: Rocker board 1  min. Forward and back and 1 min. Side to side 3# weights for knee extension 2 x 15 Knee flexion with green resistive band 2 x 15-20 reps Rhythmic stabilization with manual resistance given flexion/extension of trunk with patient seated on stability ball 10 reps Cable exercises at [employer]; sitting on stability ball with base in place for balance, safety 20# paloff press x 15 20# bilateral scapular retraction x 20 20# straight arm pull downs with guided ROM, short arc x 15 reps 25# reverse pull ups x 15 reps  Patient response to treatment: Patient demonstrated catching pain in back and right hip region intermittently throughout session. She required minimal VC and assistance to complete all exercises. Improved strength with increased intensity on Cable exercises by 5# from previous session.         PT Education - 10/27/16 1301    Education provided Yes   Education Details exercise instruction/proper alignment, technique    Person(s) Educated Patient   Methods Explanation;Demonstration;Verbal cues   Comprehension Verbalized understanding;Returned demonstration;Verbal cues required             PT Long Term Goals - 09/22/16 1430      PT LONG TERM GOAL #1   Title patient will demonstrate improved function with decreased back pain with MODI score of 20% or less by 11/03/2016   Baseline MODI 34% (moderate self perceived disability) MODI 45% impaired 07/17/2016; 38% 08/08/2016; 30% 09/22/2016    Status Revised     PT LONG TERM GOAL #2   Title Patient  will demonstrate good knowledge of posture and body mechanics for back and be able to stand and walk > 1200' with 6 min. walk test by 11/03/2016 demonstrating improved endurance for community activities   Baseline 08/05/2016 950' with back pain : new injury to left foot prevents re assessment   Status Revised     PT LONG TERM GOAL #3   Title Improve  LEFS score to 50/80 or better  indicating improved function with daily tasks, improved LE  strength by 11/03/2016   Baseline LEFs 30.5/80 08/08/2016; new injury to left foot prevents re accurate re assessment   Status Revised     PT LONG TERM GOAL #4   Title Patient will be independent with home exercises without cuing for core control, strengthening to allow self management once discharged from physical therapy 11/03/16   Baseline limited knowledge and moderate VC required for guided exercises and progression    Status Revised               Plan - 10/27/16 1302    Clinical Impression Statement Patient is progressing with exercises and continues with intermittent pain in lower back  and right hip region. She demonstrates good carry over between sessions as indicated by ability to increase intensity of exercises by 5# on cable today. She should continue to improve with additional physical therapy intervention.    Rehab Potential Good   Clinical Impairments Affecting Rehab Potential (+) motivated (-) chronic condition of pain in hip/low back; multiple surgeries on spine within 6 months   PT Frequency 2x / week   PT Duration 6 weeks   PT Treatment/Interventions Manual techniques;Electrical Stimulation;Moist Heat;Patient/family education;Cryotherapy;Therapeutic exercise;Dry needling;Ultrasound   PT Next Visit Plan pain control, progress exercises as tolerated; stabilization exercises   PT Home Exercise Plan home program for stabilization and strengthening exercises       Patient will benefit from skilled therapeutic intervention in order to improve the following deficits and impairments:  Pain, Impaired flexibility, Difficulty walking, Increased muscle spasms, Decreased strength, Impaired perceived functional ability, Decreased activity tolerance, Decreased range of motion  Visit Diagnosis: Muscle weakness (generalized)  Bilateral low back pain without sciatica, unspecified chronicity     Problem List Patient Active Problem List   Diagnosis Date Noted  . S/P lumbar spinal  fusion 12/13/2015  . Controlled type 2 diabetes mellitus without complication (HCC) 05/16/2015  . Thrombocytopenia (HCC) 05/16/2015  . Recurrent major depressive disorder, in full remission (HCC) 05/16/2015  . Essential (primary) hypertension 05/16/2015  . Fatty infiltration of liver 03/15/2015  . Aortic valve stenosis, nonrheumatic 01/18/2015  . Sciatica of right side 01/09/2015  . Type 2 diabetes mellitus (HCC) 11/10/2014  . RLS (restless legs syndrome) 08/23/2014  . Neuritis or radiculitis due to rupture of lumbar intervertebral disc 06/13/2014  . Degeneration of intervertebral disc of lumbar region 06/13/2014  . Arthritis 01/23/2014  . Arthritis of knee, degenerative 11/15/2013  . Depression 09/29/2013  . Insomnia 09/29/2013  . Apnea, sleep 08/29/2013  . Calculus of kidney 08/29/2013  . Abnormal presence of protein in urine 08/29/2013  . Headache, migraine 08/29/2013  . BP (high blood pressure) 08/29/2013  . HLD (hyperlipidemia) 08/29/2013  . Allergic rhinitis 08/29/2013  . Intractable migraine without aura 02/18/2013    Beacher May PT 10/27/2016, 5:39 PM  [facility] PHYSICAL AND SPORTS MEDICINE [address] [place], [address] Phone: [phone]   Fax:  [phone]  Name: Jessica Cole MRN: [id number] Date of Birth: Sep 29, 1950

## 2016-10-29 ENCOUNTER — Ambulatory Visit: Payer: Medicare HMO | Admitting: Physical Therapy

## 2016-10-30 ENCOUNTER — Ambulatory Visit: Payer: Medicare HMO | Admitting: Adult Health

## 2016-10-31 ENCOUNTER — Encounter: Payer: Self-pay | Admitting: Adult Health

## 2016-11-03 ENCOUNTER — Ambulatory Visit: Payer: Medicare HMO | Admitting: Physical Therapy

## 2016-11-04 ENCOUNTER — Ambulatory Visit: Payer: Medicare HMO | Admitting: Physical Therapy

## 2016-11-06 ENCOUNTER — Ambulatory Visit: Payer: Medicare HMO | Attending: Neurological Surgery | Admitting: Physical Therapy

## 2016-11-06 DIAGNOSIS — M6281 Muscle weakness (generalized): Secondary | ICD-10-CM | POA: Insufficient documentation

## 2016-11-06 NOTE — Therapy (Signed)
[facility] PHYSICAL AND SPORTS MEDICINE [address] [place], [place], [address] Phone: [phone]   Fax:  [phone]  Physical Therapy Discharge Summary  Patient Details  Name: Jessica Cole MRN: [id number] Date of Birth: 09-16-1950 Referring Provider: Tia AlertJones, David S MD  Encounter Date: 11/06/2016   Patient began physical therapy treatment 06/25/2016 and has attended 25 sessions of therapy through 11/03/2016. She reports feeling at least 80% improvement since beginning physical therapy and is independent with self management of symptoms and home exercise program. Goals have been achieved and patient is ready for discharge from physical therapy at this time.     Past Medical History:  Diagnosis Date  . Anxiety   . Colon polyps   . Degenerative arthritis   . Depression   . Diabetes mellitus without complication (HCC)    Type II  . Environmental allergies   . Family history of adverse reaction to anesthesia    sister- PONV  . Fatty liver   . Glaucoma   . Headache(784.0)    HX  MIGRAINES  . History of bronchitis   . History of kidney stones   . History of pneumonia   . Hypertension   . Hypothyroidism   . Obesity   . OSA on CPAP   . Plantar fasciitis    right  . PONV (postoperative nausea and vomiting)   . Renal calculi   . Renal calculi   . RLS (restless legs syndrome) 08/23/2014    Past Surgical History:  Procedure Laterality Date  . ABDOMINAL HYSTERECTOMY     partial  . APPENDECTOMY    . Arthroscopic surgery, knee Left   . BREAST BIOPSY Right    several  . BUNIONECTOMY     LEFT   . COLONOSCOPY W/ POLYPECTOMY    . FOOT SURGERY     RIGHT    . KNEE ARTHROSCOPY W/ MENISCAL REPAIR Left   . LASIK    . MAXIMUM ACCESS (MAS)POSTERIOR LUMBAR INTERBODY FUSION (PLIF) 1 LEVEL N/A 04/25/2016   Procedure: LUMBAR FOUR-FIVE  MAXIMUM ACCESS (MAS) POSTERIOR LUMBAR INTERBODY FUSION (PLIF) with extension of instrumentation LUMBAR TWO-FIVE;   Surgeon: Tia Alertavid S Jones, MD;  [facility];  Service: Neurosurgery;  Laterality: N/A;  . MAXIMUM ACCESS (MAS)POSTERIOR LUMBAR INTERBODY FUSION (PLIF) 2 LEVEL N/A 12/13/2015   Procedure: Lumbar two-three - Lumbar three-four MAXIMUM ACCESS (MAS) POSTERIOR LUMBAR INTERBODY FUSION (PLIF)  ;  Surgeon: Tia Alertavid S Jones, MD;  [facility];  Service: Neurosurgery;  Laterality: N/A;  . SHOULDER SURGERY     RIGHT   . TONSILLECTOMY      There were no vitals filed for this visit.      Subjective Assessment - 11/06/16 1040    Subjective Patient reports she was out of town and had to climb a lot of stairs and is still sore in right side lower back. Overall she reports feeling at least 80% improvement in symptoms and pain since beginning physical therapy.    Pertinent History Patient reports progressive worsening back and leg pain over the past year and a half. She had surgery 12/13/2015 for fusion L2-3, L3-4 and then increased pain with radiculopathy in left LE with subsequent surgery PLIF L4-5 04/25/2016. Since surgery she has been gradually increasing activity.    Limitations Sitting;Standing;Walking;House hold activities   How long can you sit comfortably? 30 min.   How long can you stand comfortably? 10 min.   How long can you walk  comfortably? 30 min.   Patient Stated Goals to return to full activity without difficulty   Currently in Pain? Other (Comment)  discomfort in lower back, right side>left            PT Education - 11/06/16 1042    Education provided Yes   Education Details re assessed home exercises verbally   Person(s) Educated Patient   Methods Explanation   Comprehension Verbalized understanding             PT Long Term Goals - 11/06/16 10:45     PT LONG TERM GOAL #1   Title patient will demonstrate improved function with decreased back pain with MODI score of 20% or less by 11/03/2016   Baseline MODI 34% (moderate self perceived disability) MODI 45% impaired  07/17/2016; 38% 08/08/2016; 30% 09/22/2016    Status Achieved     PT LONG TERM GOAL #2   Title Patient  will demonstrate good knowledge of posture and body mechanics for back and be able to stand and walk > 1200' with 6 min. walk test by 11/03/2016 demonstrating improved endurance for community activities   Baseline 08/05/2016 950' with back pain : new injury to left foot prevents re assessment   Status Not re assessed     PT LONG TERM GOAL #3   Title Improve  LEFS score to 50/80 or better  indicating improved function with daily tasks, improved LE strength by 11/03/2016   Baseline LEFs 30.5/80 08/08/2016; new injury to left foot prevents re accurate re assessment   Status Achieved     PT LONG TERM GOAL #4   Title Patient will be independent with home exercises without cuing for core control, strengthening to allow self management once discharged from physical therapy 11/03/16   Baseline limited knowledge and moderate VC required for guided exercises and progression    Status Achieved               Plan - 11/06/16 1043    Clinical Impression Statement Patient demonstrates good understanding of home program and has achieve goals for improved function, decreased pain and independent with home program. She is ready for discharge from physical therapy at this time.    Rehab Potential Good   Clinical Impairments Affecting Rehab Potential (+) motivated (-) chronic condition of pain in hip/low back; multiple surgeries on spine within 6 months   PT Frequency 2x / week   PT Duration 6 weeks   PT Treatment/Interventions Manual techniques;Electrical Stimulation;Moist Heat;Patient/family education;Cryotherapy;Therapeutic exercise;Dry needling;Ultrasound   PT Next Visit Plan pain control, progress exercises as tolerated; stabilization exercises   PT Home Exercise Plan home program for stabilization and strengthening exercises       Patient will benefit from skilled therapeutic intervention in order to  improve the following deficits and impairments:  Pain, Impaired flexibility, Difficulty walking, Increased muscle spasms, Decreased strength, Impaired perceived functional ability, Decreased activity tolerance, Decreased range of motion  Visit Diagnosis: Muscle weakness (generalized)     Problem List Patient Active Problem List   Diagnosis Date Noted  . S/P lumbar spinal fusion 12/13/2015  . Controlled type 2 diabetes mellitus without complication (HCC) 05/16/2015  . Thrombocytopenia (HCC) 05/16/2015  . Recurrent major depressive disorder, in full remission (HCC) 05/16/2015  . Essential (primary) hypertension 05/16/2015  . Fatty infiltration of liver 03/15/2015  . Aortic valve stenosis, nonrheumatic 01/18/2015  . Sciatica of right side 01/09/2015  . Type 2 diabetes mellitus (HCC) 11/10/2014  . RLS (restless legs syndrome)  08/23/2014  . Neuritis or radiculitis due to rupture of lumbar intervertebral disc 06/13/2014  . Degeneration of intervertebral disc of lumbar region 06/13/2014  . Arthritis 01/23/2014  . Arthritis of knee, degenerative 11/15/2013  . Depression 09/29/2013  . Insomnia 09/29/2013  . Apnea, sleep 08/29/2013  . Calculus of kidney 08/29/2013  . Abnormal presence of protein in urine 08/29/2013  . Headache, migraine 08/29/2013  . BP (high blood pressure) 08/29/2013  . HLD (hyperlipidemia) 08/29/2013  . Allergic rhinitis 08/29/2013  . Intractable migraine without aura 02/18/2013    Beacher May PT 11/06/2016, 10:47 AM  [place] [facility] PHYSICAL AND SPORTS MEDICINE [address] [place], [address] Phone: [phone]   Fax:  [phone]  Name: Jessica Cole MRN: [id number] Date of Birth: 11/03/1950

## 2016-11-13 ENCOUNTER — Other Ambulatory Visit: Payer: Self-pay | Admitting: Internal Medicine

## 2016-11-13 DIAGNOSIS — H539 Unspecified visual disturbance: Secondary | ICD-10-CM

## 2016-11-19 DIAGNOSIS — R05 Cough: Secondary | ICD-10-CM | POA: Insufficient documentation

## 2016-11-19 DIAGNOSIS — R053 Chronic cough: Secondary | ICD-10-CM | POA: Insufficient documentation

## 2016-11-24 ENCOUNTER — Ambulatory Visit
Admission: RE | Admit: 2016-11-24 | Discharge: 2016-11-24 | Disposition: A | Discharge status: Home / Self Care | Payer: Medicare HMO | Source: Ambulatory Visit | Attending: Internal Medicine | Admitting: Internal Medicine

## 2016-11-24 DIAGNOSIS — H539 Unspecified visual disturbance: Secondary | ICD-10-CM

## 2016-12-18 ENCOUNTER — Encounter: Payer: Self-pay | Admitting: Adult Health

## 2016-12-18 ENCOUNTER — Ambulatory Visit (INDEPENDENT_AMBULATORY_CARE_PROVIDER_SITE_OTHER): Payer: Medicare HMO | Admitting: Adult Health

## 2016-12-18 ENCOUNTER — Encounter (INDEPENDENT_AMBULATORY_CARE_PROVIDER_SITE_OTHER): Payer: Self-pay

## 2016-12-18 VITALS — BP 175/84 | HR 56 | Ht 61.0 in | Wt 195.6 lb

## 2016-12-18 DIAGNOSIS — G43009 Migraine without aura, not intractable, without status migrainosus: Secondary | ICD-10-CM | POA: Diagnosis not present

## 2016-12-18 DIAGNOSIS — R443 Hallucinations, unspecified: Secondary | ICD-10-CM | POA: Diagnosis not present

## 2016-12-18 DIAGNOSIS — G2581 Restless legs syndrome: Secondary | ICD-10-CM

## 2016-12-18 NOTE — Patient Instructions (Addendum)
Your Plan:  Continue Requip and Gabapentin for Restless legs Tizanidine can also help with headaches.  Consider pychiatrist   Thank you for coming to see [facility] at [facility]. I hope we have been able to provide you high quality care today.  You may receive a patient satisfaction survey over the next few weeks. We would appreciate your feedback and comments so that we may continue to improve ourselves and the health of our patients.

## 2016-12-18 NOTE — Progress Notes (Signed)
I have read the note, and I agree with the clinical assessment and plan.  WILLIS,CHARLES KEITH   

## 2016-12-18 NOTE — Progress Notes (Signed)
PATIENT: Jessica PutnamGail T Cole DOB: [birth year]  REASON FOR VISIT: follow up- restless legs, headache, back pain HISTORY FROM: patient  HISTORY OF PRESENT ILLNESS: Today 12/18/16 [name] is a 66 year old female with a history of right-sided back pain with sciatica, restless legs and headaches. She returns today for follow-up. She states that she is currently taking gabapentin and Requip for restless legs. She reports that this is controlling her symptoms. She states that her headache frequency has increased in the last several months. According to her headache journal she had 3 headaches in April, 2 headaches in May, 9 headaches in June and 6 headaches in July. The patient is on CPAP therapy however no one is managing this. She reports that there is been no compliance download to check for adequate treatment of her sleep apnea. The patient was recently placed on tizanidine for muscle spasms. The patient states that her most concerning symptom is hallucinations. She reports that she has discussed this with her primary care provider. Reports that the hallucinations started February. They always occur at bedtime. She states that she will see people standing by her bed. She reports that some of the hallucinations are concerning but not fearful. She reports that she has stopped trazodone as her pharmacist reported that he can cause hallucinations. She told the nurse that she stopped Valium because it was causing hallucinations. However she reports to me that the hallucinations have continued despite stopping these medications. The patient also states that she is having marital problems that is adding to her stress level. She also states that she's been getting messages on her phone from a psychic that "knows things about her." She states that she has tried to stop the messages but they keep coming. She reports that she is also concerned about this. She returns today for an evaluation.  HISTORY 07/21/2016: Ms.  Casimiro NeedleMichael is a 66 year old female with a history of right-sided back pain with sciatica, restless legs and headaches. She returns today for follow-up. Diazepam was recently added for restless leg symptoms. She states that taking Requip, gabapentin and Valium have been beneficial for her restless leg symptoms. She states that this combination she is able to sleep. She states that her back pain has continued to improve. She believes that all of her additional symptoms including restless legs has also improved since her back pain has also improved. She states for the month of March she has been headache free. She returns today for an evaluation.   HISTORY 01/22/16: Ms. Theodis AguasMichaelis a 66 year old female with a history of back pain with right-sided sciatica, restless legs and headaches. She returns today for follow-up. She reports in August she had back surgery with Dr. Yetta BarreJones. She states recently her pain is slightly worse. She is currently taking hydrocodone. She also reports that her headache frequency in September has increased. Looking at her calendar she had 2 headaches in August. She reports that her headache location varies. She does have light and noise sensitivity. Does report mild nausea. She also reports that since surgery her restless legs has gotten slightly worse. She is currently on gabapentin and Requip. She returns today for an evaluation.  HISTORY 08/06/15: [name] is a 66 year old female with a history of obesity, low back pain with right-sided sciatica, restless legs and headaches. She returns today for follow-up. She reports that her primary complaint is her ongoing back pain. She states that she had a myelogram with Dr. Yetta BarreJones and has a follow-up appointment tomorrow. She is  hoping that surgery will be an option for her. She reports that her back pain exacerbates her restless legs. She states that she feels that the Requip would work well for her if she did not have back pain as well. The patient  is currently taking trazodone to help with sleep however she states that the back pain usually prohibits this. She continues to take oxycodone for her discomfort. She denies any new neurological symptoms. She returns today for an evaluation.  HISTORY 05/08/15 (WILLIS): Ms. Barrasso is a 66 year old right-handed white female with a history of obesity, low back pain with right-sided sciatica, and migraine headache. The patient also reports difficulty with restless leg syndrome. The patient has undergone MRI evaluation of the low back showing possible right L3 nerve root compression, the patient has been seen by neurosurgery. The patient has not been considered for surgery at this time, she was sent for physical therapy. The patient is engaged in physical therapy at this time. The patient reports some variability with her headaches, in October and November, she had 3 headaches each month, but she had 11 headaches in December. The patient indicates that weather may be a factor in her headache. The patient is on gabapentin and Cymbalta currently. She takes trazodone at night 200 mg for sleep. She has restless leg syndrome and she takes Requip taking 2 mg twice daily. The patient is having some issues with restless legs at this time. The patient continues to have back pain that wakes her up at night, she has pain in the low back radiating down the leg to the ankle. No weakness has been noted. The patient reports that the pain is now starting some on the left side.   REVIEW OF SYSTEMS: Out of a complete 14 system review of symptoms, the patient complains only of the following symptoms, and all other reviewed systems are negative.  Activity change, appetite change, fatigue, excessive sweating, ringing in ears, runny nose, eye discharge, eye itching, eye redness, light sensitivity, cough, shortness of breath, chest tightness, chest pain, restless leg, insomnia, apnea, snoring, constipation, diarrhea, nausea, cold  intolerance, heat intolerance, excessive thirst, excessive eating, flushing, environmental allergies, frequency of urination, joint pain, joint swelling, back pain, muscle cramps, walking difficulty, neck pain, neck stiffness, moles, depression, nervous/anxious, hallucinations, headache, numbness, bruise and bleed easily.  ALLERGIES: Allergies  Allergen Reactions  . Diazepam     hallucinations  . Dexamethasone Other (See Comments)    During a tapered dose, once.  Was shaky (side effect, not allergy).    HOME MEDICATIONS: Outpatient Medications Prior to Visit  Medication Sig Dispense Refill  . azelastine (ASTELIN) 0.1 % nasal spray Place 1 spray into both nostrils 2 (two) times daily.     . B Complex Vitamins (VITAMIN B COMPLEX PO) Take 1 tablet by mouth daily.     . Coenzyme Q10 (CO Q 10) 100 MG CAPS Take 200 mg by mouth daily.     . diazepam (VALIUM) 5 MG tablet Take 1 tablet (5 mg total) by mouth at bedtime. 30 tablet 3  . diclofenac sodium (VOLTAREN) 1 % GEL Apply 4 g topically 2 (two) times daily. (Patient taking differently: Apply 4 g topically daily as needed (for pain). ) 100 g 1  . DULoxetine (CYMBALTA) 30 MG capsule Take 90 mg by mouth daily.     . enalapril (VASOTEC) 10 MG tablet Take 10 mg by mouth daily.    Marland Kitchen estradiol (VIVELLE-DOT) 0.05 MG/24HR patch Place 1 patch  onto the skin 2 (two) times a week.     . fexofenadine (ALLER-EASE) 180 MG tablet Take 180 mg by mouth daily.    . fluticasone (FLONASE) 50 MCG/ACT nasal spray Place 2 sprays into both nostrils 2 (two) times daily.     Marland Kitchen gabapentin (NEURONTIN) 800 MG tablet TAKE ONE (1) TABLET BY MOUTH TWO (2) TIMES DAILY 180 tablet 1  . Genistein (I-COOL FOR MENOPAUSE) 30 MG TABS Take 30 mg by mouth every morning.    . latanoprost (XALATAN) 0.005 % ophthalmic solution Place 1 drop into both eyes at bedtime.     Marland Kitchen levothyroxine (SYNTHROID, LEVOTHROID) 112 MCG tablet Take 112 mcg by mouth daily before breakfast.     . losartan  (COZAAR) 50 MG tablet Take 50 mg by mouth every evening.     . metFORMIN (GLUCOPHAGE) 500 MG tablet Take 500 mg by mouth 2 (two) times daily with a meal.     . methocarbamol (ROBAXIN) 500 MG tablet Take 1 tablet (500 mg total) by mouth every 6 (six) hours as needed for muscle spasms. 50 tablet 0  . metoprolol succinate (TOPROL-XL) 25 MG 24 hr tablet TAKE ONE TABLET BY MOUTH EVERY DAY 90 tablet 1  . nabumetone (RELAFEN) 500 MG tablet Take 500 mg by mouth 2 (two) times daily.    . nabumetone (RELAFEN) 500 MG tablet Take 500 mg by mouth 2 (two) times daily.    . Omega-3 Fatty Acids (FISH OIL ULTRA) 1000 MG CAPS Take 1,000 mg by mouth 2 (two) times daily.     Marland Kitchen oxybutynin (DITROPAN) 5 MG tablet Take 5 mg by mouth 2 (two) times daily.    . Oxycodone HCl 10 MG TABS Take 1 tablet (10 mg total) by mouth every 4 (four) hours as needed (for pain). (Patient not taking: Reported on 07/21/2016) 60 tablet 0  . predniSONE (DELTASONE) 5 MG tablet Begin taking 6 tablets daily, taper by one tablet daily until off the medication. 21 tablet 0  . Red Yeast Rice 600 MG CAPS Take 1,200 mg by mouth 2 (two) times daily.    Marland Kitchen rOPINIRole (REQUIP) 4 MG tablet TAKE ONE (1) TABLET BY MOUTH TWO (2) TIMES DAILY 60 tablet 5  . sulindac (CLINORIL) 200 MG tablet Take 200 mg by mouth 2 (two) times daily.    . traZODone (DESYREL) 100 MG tablet TAKE ONE TO TWO TABLETS BY MOUTH AT BEDTIME FOR SLEEP 60 tablet 5   No facility-administered medications prior to visit.     PAST MEDICAL HISTORY: Past Medical History:  Diagnosis Date  . Anxiety   . Colon polyps   . Degenerative arthritis   . Depression   . Diabetes mellitus without complication (HCC)    Type II  . Environmental allergies   . Family history of adverse reaction to anesthesia    sister- PONV  . Fatty liver   . Glaucoma   . Headache(784.0)    HX  MIGRAINES  . History of bronchitis   . History of kidney stones   . History of pneumonia   . Hypertension   .  Hypothyroidism   . Obesity   . OSA on CPAP   . Plantar fasciitis    right  . PONV (postoperative nausea and vomiting)   . Renal calculi   . Renal calculi   . RLS (restless legs syndrome) 08/23/2014    PAST SURGICAL HISTORY: Past Surgical History:  Procedure Laterality Date  . ABDOMINAL HYSTERECTOMY  partial  . APPENDECTOMY    . Arthroscopic surgery, knee Left   . BREAST BIOPSY Right    several  . BUNIONECTOMY     LEFT   . COLONOSCOPY W/ POLYPECTOMY    . FOOT SURGERY     RIGHT    . KNEE ARTHROSCOPY W/ MENISCAL REPAIR Left   . LASIK    . MAXIMUM ACCESS (MAS)POSTERIOR LUMBAR INTERBODY FUSION (PLIF) 1 LEVEL N/A 04/25/2016   Procedure: LUMBAR FOUR-FIVE  MAXIMUM ACCESS (MAS) POSTERIOR LUMBAR INTERBODY FUSION (PLIF) with extension of instrumentation LUMBAR TWO-FIVE;  Surgeon: Tia Alert, MD;  Location: [facility] OR;  Service: Neurosurgery;  Laterality: N/A;  . MAXIMUM ACCESS (MAS)POSTERIOR LUMBAR INTERBODY FUSION (PLIF) 2 LEVEL N/A 12/13/2015   Procedure: Lumbar two-three - Lumbar three-four MAXIMUM ACCESS (MAS) POSTERIOR LUMBAR INTERBODY FUSION (PLIF)  ;  Surgeon: Tia Alert, MD;  Location[facility]S;  Service: Neurosurgery;  Laterality: N/A;  . SHOULDER SURGERY     RIGHT   . TONSILLECTOMY      FAMILY HISTORY: Family History  Problem Relation Age of Onset  . Lung cancer Mother   . Congestive Heart Failure Father   . Depression Sister   . Rheum arthritis Sister   . Headache Maternal Grandfather   . Migraines Maternal Grandfather   . Headache Maternal Uncle   . Diabetes Unknown   . Heart disease Unknown   . Hypertension Unknown   . Breast cancer Neg Hx     SOCIAL HISTORY: Social History   Social History  . Marital status: Married    Spouse name: Milinda Cave   . Number of children: 0  . Years of education: 12   Occupational History  . retired    Social History Main Topics  . Smoking status: Never Smoker  . Smokeless tobacco: Never Used  . Alcohol use No  .  Drug use: No  . Sexual activity: Not on file   Other Topics Concern  . Not on file   Social History Narrative   Patient is married and lives with her husband.   Patient has a high school education.   Patient has no children.    Patient works for the [employer]   Patient is right handed.   Patient drinks occasionally drinks caffeine.      PHYSICAL EXAM  Vitals:   12/18/16 0846  BP: (!) 175/84  Pulse: (!) 56  Weight: 195 lb 9.6 oz (88.7 kg)  Height: 5\' 1"  (1.549 m)   Body mass index is 36.96 kg/m.  Generalized: Well developed, in no acute distress   Neurological examination  Mentation: Alert oriented to time, place, history taking. Follows all commands speech and language fluent Cranial nerve II-XII: Pupils were equal round reactive to light. Extraocular movements were full, visual field were full on confrontational test. Facial sensation and strength were normal. Uvula tongue midline. Head turning and shoulder shrug  were normal and symmetric. Motor: The motor testing reveals 5 over 5 strength of all 4 extremities. Good symmetric motor tone is noted throughout.  Sensory: Sensory testing is intact to soft touch on all 4 extremities. No evidence of extinction is noted.  Coordination: Cerebellar testing reveals good finger-nose-finger and heel-to-shin bilaterally.  Gait and station: Gait is normal.  Reflexes: Deep tendon reflexes are symmetric and normal bilaterally.   DIAGNOSTIC DATA (LABS, IMAGING, TESTING) - I reviewed patient records, labs, notes, testing and imaging myself where available.  Lab Results  Component Value Date  WBC 4.6 08/18/2016   HGB 14.2 08/18/2016   HCT 42.4 08/18/2016   MCV 89.4 08/18/2016   PLT 151 08/18/2016      Component Value Date/Time   NA 139 08/18/2016 1223   NA 138 06/08/2013 1558   K 3.9 08/18/2016 1223   K 4.1 06/08/2013 1558   CL 105 08/18/2016 1223   CL 106 06/08/2013 1558   CO2 26 08/18/2016 1223   CO2 29  06/08/2013 1558   GLUCOSE 149 (H) 08/18/2016 1223   GLUCOSE 93 06/08/2013 1558   BUN 16 08/18/2016 1223   BUN 11 06/08/2013 1558   CREATININE 0.58 08/18/2016 1223   CREATININE 0.64 06/08/2013 1558   CALCIUM 9.7 08/18/2016 1223   CALCIUM 9.6 06/08/2013 1558   PROT 6.5 04/18/2016 1008   PROT 7.5 08/01/2011 1307   ALBUMIN 3.9 04/18/2016 1008   ALBUMIN 4.3 08/01/2011 1307   AST 26 04/18/2016 1008   AST 47 (H) 08/01/2011 1307   ALT 29 04/18/2016 1008   ALT 69 08/01/2011 1307   ALKPHOS 56 04/18/2016 1008   ALKPHOS 69 08/01/2011 1307   BILITOT 0.6 04/18/2016 1008   BILITOT 0.4 08/01/2011 1307   GFRNONAA >60 08/18/2016 1223   GFRNONAA >60 06/08/2013 1558   GFRAA >60 08/18/2016 1223   GFRAA >60 06/08/2013 1558   Lab Results  Component Value Date   CHOL 225 (H) 08/02/2011   HDL 32 (L) 08/02/2011   LDLCALC 159 (H) 08/02/2011   TRIG 171 08/02/2011   Lab Results  Component Value Date   HGBA1C 6.6 (H) 04/18/2016      ASSESSMENT AND PLAN 66 y.o. year old female  has a past medical history of Anxiety; Colon polyps; Degenerative arthritis; Depression; Diabetes mellitus without complication (HCC); Environmental allergies; Family history of adverse reaction to anesthesia; Fatty liver; Glaucoma; Headache(784.0); History of bronchitis; History of kidney stones; History of pneumonia; Hypertension; Hypothyroidism; Obesity; OSA on CPAP; Plantar fasciitis; PONV (postoperative nausea and vomiting); Renal calculi; Renal calculi; and RLS (restless legs syndrome) (08/23/2014). here with:  1. Restless leg syndrome 2. Headaches 3. Chronic back pain with sciatica 4. Hallucinations  The patient will continue on gabapentin and Requip for restless legs. The patient's headache frequency has increased slightly. I advised the patient that tizanidine which was recently added can also be beneficial for her headaches. The patient is having hallucinations and possible delusional thinking. I did look at the  messages on the patient's phone from the "psychic." This is simply an email subscription that sends her vague generic messages and predictions. None of the messages give any explicit detail about her or her life. I helped her unsubscribe from these emails as well as deleted them from her phone. I recommended that she possibly see a psychiatrist for delusional thinking and ongoing hallucinations. However she deferred at this time. Advised that if her symptoms worsen or she develops new symptoms she should let us know. She will follow-up in 3 months or sooner if needed.     Butch Penny, MSN, NP-C 12/18/2016, 8:55 AM [facility] [address], [address] [place], [place] [address] [phone]

## 2017-01-08 ENCOUNTER — Telehealth: Payer: Self-pay | Admitting: Adult Health

## 2017-01-08 NOTE — Telephone Encounter (Addendum)
Spoke with patient who stated she sometimes but not often will wake up with headache, but she stated they are  "getting worse". She stated last weekend she had a headache for two days, denies headache today. She is taking  Tizanidine regularly for back pain. She cannot remember what headache medications she has taken as needed in the past, and she stated she does not want to take a medication regularly. She takes [facility] OTC Migraine Relief with fair relief. This RN discussed with NP who stated if her headaches are worsening she may come in for FU with NP or Dr Anne HahnWillis. Patient stated she has FU in Nov. This RN placed her on wait list as there were no sooner openings. Advised her that if she develops a headache/migraine that lasts several days without relief, to call this office. Patient verbalized understanding, appreciation of call.

## 2017-01-08 NOTE — Telephone Encounter (Signed)
Pt calling to inform that her headaches have gradually worsen and are like 3 day migraines.  Pt is asking that something be called that does not have the side effect of hallucinations. Pt is asking for a call back

## 2017-01-14 ENCOUNTER — Telehealth: Payer: Self-pay | Admitting: *Deleted

## 2017-01-14 MED ORDER — TOPIRAMATE 25 MG PO TABS
ORAL_TABLET | ORAL | 3 refills | Status: DC
Start: 2017-01-14 — End: 2017-03-24

## 2017-01-14 NOTE — Addendum Note (Signed)
Addended by: York SpanielWILLIS, CHARLES K on: 01/14/2017 05:39 PM   Modules accepted: Orders

## 2017-01-14 NOTE — Telephone Encounter (Signed)
Called pt for clarification on fax from pcp regarding pain. Patient stated it is in regards to her headaches which have increased in frequency but not daily. She stated she doesn't want to take a medication daily but have one on hand to take when her headaches are bad. She stated Stadol worked very well, and she would like to have it if NP agreed plus another headache medicine to take when headaches are not "really really bad". She stated she would only take Stadol for "really really bad headaches". This RN advise would discuss with NP and call her later this afternoon. Patient verbalized understanding, appreciation.

## 2017-01-14 NOTE — Telephone Encounter (Signed)
I called patient. The patient is having frequent headaches, she is having more than 50% of the days with at least a mild headache. I would recommend starting on Topamax, she has been on this in the past.  I will give Ultram if needed for headache. The patient has been on Stadol previously, but she was using the medication rapidly and the medication was not lasting her one month.

## 2017-01-15 MED ORDER — TRAMADOL HCL 50 MG PO TABS: 50.0000 mg | ORAL_TABLET | Freq: Four times a day (QID) | ORAL | 1 refills | Status: DC | PRN

## 2017-01-15 NOTE — Telephone Encounter (Signed)
Faxed printed/signed rx tramadol to [facility] pharmacy at [phone]. Received confirmation.

## 2017-01-15 NOTE — Addendum Note (Signed)
Addended by: Garlan Drewes K on: 01/15/2017 07:53 AM   Modules accepted: Orders  

## 2017-01-21 ENCOUNTER — Ambulatory Visit: Payer: Medicare HMO | Admitting: Adult Health

## 2017-01-21 ENCOUNTER — Telehealth: Payer: Self-pay | Admitting: Neurology

## 2017-01-21 MED ORDER — PREDNISONE 5 MG PO TABS: ORAL_TABLET | ORAL | 0 refills | Status: DC

## 2017-01-21 NOTE — Telephone Encounter (Signed)
Patient has been having headaches for 2 weeks and has been taking traMADol (ULTRAM) 50 MG tablet but since that is for pain she is not sure she should take every day. Is there another medication she can take? Patient would like to discuss with Dr. Anne Hahn. She uses [facility] on [address] in [place].

## 2017-01-21 NOTE — Addendum Note (Signed)
Addended by: York Spaniel on: 01/21/2017 10:59 AM   Modules accepted: Orders

## 2017-01-21 NOTE — Telephone Encounter (Signed)
I called patient. The patient is having daily headaches, she will be getting on Topamax, she takes Ultram if needed. We will try a 5 mg 6 day pack of prednisone, she has had troubles with dexamethasone in the past with dizziness, she will stop the medication if the prednisone causes dizziness.

## 2017-01-27 ENCOUNTER — Other Ambulatory Visit: Payer: Self-pay | Admitting: Neurology

## 2017-02-03 ENCOUNTER — Encounter: Payer: Self-pay | Admitting: Physical Therapy

## 2017-02-03 ENCOUNTER — Ambulatory Visit: Payer: Medicare HMO | Attending: Neurological Surgery | Admitting: Physical Therapy

## 2017-02-03 DIAGNOSIS — M25612 Stiffness of left shoulder, not elsewhere classified: Secondary | ICD-10-CM | POA: Insufficient documentation

## 2017-02-03 DIAGNOSIS — M6281 Muscle weakness (generalized): Secondary | ICD-10-CM | POA: Insufficient documentation

## 2017-02-03 DIAGNOSIS — M25512 Pain in left shoulder: Secondary | ICD-10-CM | POA: Insufficient documentation

## 2017-02-03 DIAGNOSIS — M62838 Other muscle spasm: Secondary | ICD-10-CM | POA: Insufficient documentation

## 2017-02-04 NOTE — Therapy (Signed)
[place] [facility] PH[facility] MEDICINE [address] [place], [address] Phone: [phone]   Fax:  [phone]  Physical Therapy Evaluation  Patient Details  Name: Jessica Cole MRN: [id number] Date of Birth: 10/04/1950 Referring Provider: Jena Gauss., MD  Encounter Date: 02/03/2017      PT End of Session - 02/03/17 1340    Visit Number 1   Number of Visits 12   Date for PT Re-Evaluation 03/17/17   Authorization Type 1   Authorization Time Period 10 G code   PT Start Time 1300   PT Stop Time 1420   PT Time Calculation (min) 80 min   Activity Tolerance Patient tolerated treatment well;Patient limited by pain   Behavior During Therapy [facility] for tasks assessed/performed      Past Medical History:  Diagnosis Date  . Anxiety   . Colon polyps   . Degenerative arthritis   . Depression   . Diabetes mellitus without complication (HCC)    Type II  . Environmental allergies   . Family history of adverse reaction to anesthesia    sister- PONV  . Fatty liver   . Glaucoma   . Headache(784.0)    HX  MIGRAINES  . History of bronchitis   . History of kidney stones   . History of pneumonia   . Hypertension   . Hypothyroidism   . Obesity   . OSA on CPAP   . Plantar fasciitis    right  . PONV (postoperative nausea and vomiting)   . Renal calculi   . Renal calculi   . RLS (restless legs syndrome) 08/23/2014    Past Surgical History:  Procedure Laterality Date  . ABDOMINAL HYSTERECTOMY     partial  . APPENDECTOMY    . Arthroscopic surgery, knee Left   . BREAST BIOPSY Right    several  . BUNIONECTOMY     LEFT   . COLONOSCOPY W/ POLYPECTOMY    . FOOT SURGERY     RIGHT    . KNEE ARTHROSCOPY W/ MENISCAL REPAIR Left   . LASIK    . MAXIMUM ACCESS (MAS)POSTERIOR LUMBAR INTERBODY FUSION (PLIF) 1 LEVEL N/A 04/25/2016   Procedure: LUMBAR FOUR-FIVE  MAXIMUM ACCESS (MAS) POSTERIOR LUMBAR INTERBODY FUSION (PLIF) with extension  of instrumentation LUMBAR TWO-FIVE;  Surgeon: Tia Alert, MD;  Location: [facility] OR;  Service: Neurosurgery;  Laterality: N/A;  . MAXIMUM ACCESS (MAS)POSTERIOR LUMBAR INTERBODY FUSION (PLIF) 2 LEVEL N/A 12/13/2015   Procedure: Lumbar two-three - Lumbar three-four MAXIMUM ACCESS (MAS) POSTERIOR LUMBAR INTERBODY FUSION (PLIF)  ;  Surgeon: Tia Alert, MD;  Location: [facility] NEURO ORS;  Service: Neurosurgery;  Laterality: N/A;  . SHOULDER SURGERY     RIGHT   . TONSILLECTOMY      There were no vitals filed for this visit.       Subjective Assessment - 02/03/17 1400    Subjective Patient reports left shoulder pain and stiffness   Pertinent History insidious onset of pain over the past couple of months that is limiting her movement and personal care with raising her arm above shoulder level   Limitations Lifting;House hold activities;Other (comment)  raising arm above shoulder   Patient Stated Goals to be able to use left arm for daily tasks without stiffness or catching feeling   Currently in Pain? Other (Comment)  current pain 0/10, worst 5/10            [facility] PT Assessment -  02/03/17 1348      Assessment   Medical Diagnosis rotator cuff tendinitis, left   Referring Provider Jena Gauss., MD   Onset Date/Surgical Date 12/03/16   Hand Dominance Right   Next MD Visit unknown   Prior Therapy none for left shoulder     Precautions   Precautions --   Required Braces or Orthoses --     Restrictions   Weight Bearing Restrictions No     Balance Screen   Has the patient fallen in the past 6 months No     Home Environment   Living Environment Private residence   Living Arrangements Spouse/significant other   Home Access Stairs to enter   Entrance Stairs-Number of Steps 6  back entrance   Entrance Stairs-Rails --  both   Home Layout One level     Prior Function   Level of Independence Independent   Vocation Retired   Leisure likes to bake, sew, read, gardening      Cognition   Overall Cognitive Status Within Functional Limits for tasks assessed     Observation/Other Assessments   Quick DASH  30%     Posture/Postural Control   Posture Comments mild forward head with rounded shoulders     ROM / Strength   AROM / PROM / Strength AROM;Strength     AROM   Overall AROM Comments bilateral shoulders WFL with stiffness and pain left shoulder with elevation above shoulder level     Strength   Overall Strength Comments major muscle groups bilateral shoulders grossly WNL without reproduction of pain in left shoulder; decresaed periscpular control bilaterally     Palpation   Palpation comment mild tenderness anterior aspect left shoulder as compared to right     Neer Impingement test    Comments mildly positive left as compared to right     Hawkins-Kennedy test   Comments mildy positive left as compared to right     Empty Can test   Comment negative bilaterally     Full Can test   Comment negative bilaterally      Objective measurements completed on examination: See above findings.          PT Education - 02/03/17 1434    Education provided Yes   Education Details POC, scapular retraction, posture awareness,    Person(s) Educated Patient   Methods Explanation   Comprehension Verbalized understanding             PT Long Term Goals - 02/03/17 1434      PT LONG TERM GOAL #1   Title Patient will improve function wiht left shoulder as indicated by QuickDash score of 20% or less, demonstratrating improved personal care and abiltiy to perform activties above shoulder level   Baseline quickDash 30%   Status New   Target Date 03/17/17     PT LONG TERM GOAL #2   Title  Patient will be independent with home program for pain control, exercises for flexibility and strength to allow transition to self management once discharged from physical therapy   Baseline limited knowledge of appropriate pain control, exercises and progression    Status New   Target Date 03/17/17                Plan - 02/03/17 1341    Clinical Impression Statement Patient is a 66 year old right hand dominant female who presents with left shoulder pain that began for no apparent reason. She is limited in  using left UE for daily tasks including dressing and personal care and lifting activities. Her QuickDash score of 30% indicates moderate self perceived disability. She has limited knowledge of appropriate pain control strategies, exercises or progression and will benefit from physical therapy intervention to improve function.    History and Personal Factors relevant to plan of care: diabetes, progressive pain over the past 2 months with symptoms increased and now having radiation of symtoms to hand. rotator cuff surgery right shoulder within the past 10 years.    Clinical Presentation Evolving   Clinical Presentation due to: worseing and radiating symptoms   Clinical Decision Making Moderate   Rehab Potential Good   Clinical Impairments Affecting Rehab Potential (+) motivated, acute condition (-) carpal tunnel symtpoms bilaterally, diabetes   PT Frequency 2x / week   PT Duration 6 weeks   PT Treatment/Interventions Manual techniques;Electrical Stimulation;Moist Heat;Patient/family education;Cryotherapy;Therapeutic exercise;Dry needling;Ultrasound   PT Next Visit Plan pain control, therapeutic exercise   PT Home Exercise Plan posture awareness, scapular control exercises, ROM   Consulted and Agree with Plan of Care Patient      Patient will benefit from skilled therapeutic intervention in order to improve the following deficits and impairments:  Pain, Impaired flexibility, Difficulty walking, Increased muscle spasms, Decreased strength, Impaired perceived functional ability, Decreased activity tolerance, Decreased range of motion  Visit Diagnosis: Muscle weakness (generalized) - Plan: PT plan of care cert/re-cert  Left shoulder pain,  unspecified chronicity - Plan: PT plan of care cert/re-cert  Stiffness of left shoulder, not elsewhere classified - Plan: PT plan of care cert/re-cert      G-Codes - 03/03/2017 1433    Functional Assessment Tool Used (Outpatient Only) QuickDash, pain, strength, ROM clinical judgment   Functional Limitation Carrying, moving and handling objects   Mobility: Walking and Moving Around Current Status ([id number]) At least 20 percent but less than 40 percent impaired, limited or restricted   Mobility: Walking and Moving Around Goal Status [phone]) At least 1 percent but less than 20 percent impaired, limited or restricted       Problem List Patient Active Problem List   Diagnosis Date Noted  . S/P lumbar spinal fusion 12/13/2015  . Controlled type 2 diabetes mellitus without complication (HCC) 05/16/2015  . Thrombocytopenia (HCC) 05/16/2015  . Recurrent major depressive disorder, in full remission (HCC) 05/16/2015  . Essential (primary) hypertension 05/16/2015  . Fatty infiltration of liver 03/15/2015  . Aortic valve stenosis, nonrheumatic 01/18/2015  . Sciatica of right side 01/09/2015  . Type 2 diabetes mellitus (HCC) 11/10/2014  . RLS (restless legs syndrome) 08/23/2014  . Neuritis or radiculitis due to rupture of lumbar intervertebral disc 06/13/2014  . Degeneration of intervertebral disc of lumbar region 06/13/2014  . Arthritis 01/23/2014  . Arthritis of knee, degenerative 11/15/2013  . Depression 09/29/2013  . Insomnia 09/29/2013  . Apnea, sleep 08/29/2013  . Calculus of kidney 08/29/2013  . Abnormal presence of protein in urine 08/29/2013  . Headache, migraine 08/29/2013  . BP (high blood pressure) 08/29/2013  . HLD (hyperlipidemia) 08/29/2013  . Allergic rhinitis 08/29/2013  . Intractable migraine without aura 02/18/2013    Beacher May PT 02/04/2017, 9:01 PM  [place] [facility] PHYS[facility] AND SPORTS MEDICINE [address], [place],  [address] Phone: [phone]   Fax:  [phone]  Name: Jessica Cole MRN: [id number] Date of Birth: [birth year]

## 2017-02-05 ENCOUNTER — Ambulatory Visit: Payer: Medicare HMO | Admitting: Physical Therapy

## 2017-02-05 DIAGNOSIS — M6281 Muscle weakness (generalized): Secondary | ICD-10-CM | POA: Diagnosis not present

## 2017-02-05 DIAGNOSIS — M25512 Pain in left shoulder: Secondary | ICD-10-CM

## 2017-02-05 DIAGNOSIS — M25612 Stiffness of left shoulder, not elsewhere classified: Secondary | ICD-10-CM

## 2017-02-05 NOTE — Therapy (Signed)
[place] [facility]TS [facility]IC[facility] [address] [place], [address] Phone: [phone]   Fax:  [phone]  Physical Therapy Treatment  Patient Details  Name: Jessica Cole MRN: [id number] Date of Birth: 1951-02-26 Referring Provider: Jena Gauss., MD  Encounter Date: 02/05/2017      PT End of Session - 02/05/17 1313    Visit Number 2   Number of Visits 12   Date for PT Re-Evaluation 03/17/17   Authorization Type 2   Authorization Time Period 10 G code   PT Start Time 1303   PT Stop Time 1348   PT Time Calculation (min) 45 min   Activity Tolerance Patient tolerated treatment well;Patient limited by pain   Behavior During Therapy [facility] for tasks assessed/performed      Past Medical History:  Diagnosis Date  . Anxiety   . Colon polyps   . Degenerative arthritis   . Depression   . Diabetes mellitus without complication (HCC)    Type II  . Environmental allergies   . Family history of adverse reaction to anesthesia    sister- PONV  . Fatty liver   . Glaucoma   . Headache(784.0)    HX  MIGRAINES  . History of bronchitis   . History of kidney stones   . History of pneumonia   . Hypertension   . Hypothyroidism   . Obesity   . OSA on CPAP   . Plantar fasciitis    right  . PONV (postoperative nausea and vomiting)   . Renal calculi   . Renal calculi   . RLS (restless legs syndrome) 08/23/2014    Past Surgical History:  Procedure Laterality Date  . ABDOMINAL HYSTERECTOMY     partial  . APPENDECTOMY    . Arthroscopic surgery, knee Left   . BREAST BIOPSY Right    several  . BUNIONECTOMY     LEFT   . COLONOSCOPY W/ POLYPECTOMY    . FOOT SURGERY     RIGHT    . KNEE ARTHROSCOPY W/ MENISCAL REPAIR Left   . LASIK    . MAXIMUM ACCESS (MAS)POSTERIOR LUMBAR INTERBODY FUSION (PLIF) 1 LEVEL N/A 04/25/2016   Procedure: LUMBAR FOUR-FIVE  MAXIMUM ACCESS (MAS) POSTERIOR LUMBAR INTERBODY FUSION (PLIF) with extension  of instrumentation LUMBAR TWO-FIVE;  Surgeon: Tia Alert, MD;  Location: [facility] OR;  Service: Neurosurgery;  Laterality: N/A;  . MAXIMUM ACCESS (MAS)POSTERIOR LUMBAR INTERBODY FUSION (PLIF) 2 LEVEL N/A 12/13/2015   Procedure: Lumbar two-three - Lumbar three-four MAXIMUM ACCESS (MAS) POSTERIOR LUMBAR INTERBODY FUSION (PLIF)  ;  Surgeon: Tia Alert, MD;  Location: [facility] NEURO ORS;  Service: Neurosurgery;  Laterality: N/A;  . SHOULDER SURGERY     RIGHT   . TONSILLECTOMY      There were no vitals filed for this visit.      Subjective Assessment - 02/05/17 1311    Subjective Patient reports her left shoulder is feeling better    Pertinent History insidious onset of pain over the past couple of months that is limiting her movement and personal care with raising her arm above shoulder level   Limitations Lifting;House hold activities;Other (comment)  raising arm above shoulder   Patient Stated Goals to be able to use left arm for daily tasks without stiffness or catching feeling   Currently in Pain? No/denies     Objective: Observation: ecchymosis with differing colors, for different stages of healing, (patient reports this was worse  the day following exam at MD office 01/13/2017) along left upper arm 4-41/2 " height and 7" horizontal around arm  Treatment:  Therapeutic exercise: patient performed with demonstration, verbal and tactile cues of therapist: Supine lying:  scapular retraction with manual resistance and cuing for full ROM, 10 reps  Side lying right:  Scapular control exercises elevation, depression 2 x 10 Scapular retraction/protraction 2 x 10  Standing at wall: Scapular control retraction x 1, with ER of shoulders Shoulder ER x 10  Manual therapy: 10 min.: goal: improve soft tissue elasticity, ROM STM performed to cervical spine, upper trapezius muscles with patient supine lying, upper trapezius strength 3 x 20 seconds each   Patient response to treatment: patient  demonstrated improved technique with exercises with minimal VC and tactile cuing for correct alignment and technique. Improved motor control with repetition and cuing          PT Education - 02/05/17 1313    Education provided Yes   Education Details exercise instruction   Person(s) Educated Patient   Methods Explanation;Demonstration;Verbal cues   Comprehension Verbalized understanding;Returned demonstration;Verbal cues required             PT Long Term Goals - 02/03/17 1434      PT LONG TERM GOAL #1   Title Patient will improve function wiht left shoulder as indicated by QuickDash score of 20% or less, demonstratrating improved personal care and abiltiy to perform activties above shoulder level   Baseline quickDash 30%   Status New   Target Date 03/17/17     PT LONG TERM GOAL #2   Title  Patient will be independent with home program for pain control, exercises for flexibility and strength to allow transition to self management once discharged from physical therapy   Baseline limited knowledge of appropriate pain control, exercises and progression   Status New   Target Date 03/17/17               Plan - 02/05/17 1400    Clinical Impression Statement Patient demonstrates improvement with less pain and stiffness in left shoulder and is improving posture, motor control of periscapular muscles which will allow her to improve functional use left shoulder/UE with less difficulty.    Rehab Potential Good   Clinical Impairments Affecting Rehab Potential (+) motivated, acute condition (-) carpal tunnel symtpoms bilaterally, diabetes   PT Frequency 2x / week   PT Duration 6 weeks   PT Treatment/Interventions Manual techniques;Electrical Stimulation;Moist Heat;Patient/family education;Cryotherapy;Therapeutic exercise;Dry needling;Ultrasound   PT Next Visit Plan pain control, therapeutic exercise   PT Home Exercise Plan posture awareness, scapular control exercises, ROM       Patient will benefit from skilled therapeutic intervention in order to improve the following deficits and impairments:  Pain, Impaired flexibility, Difficulty walking, Increased muscle spasms, Decreased strength, Impaired perceived functional ability, Decreased activity tolerance, Decreased range of motion  Visit Diagnosis: Left shoulder pain, unspecified chronicity  Muscle weakness (generalized)  Stiffness of left shoulder, not elsewhere classified     Problem List Patient Active Problem List   Diagnosis Date Noted  . S/P lumbar spinal fusion 12/13/2015  . Controlled type 2 diabetes mellitus without complication (HCC) 05/16/2015  . Thrombocytopenia (HCC) 05/16/2015  . Recurrent major depressive disorder, in full remission (HCC) 05/16/2015  . Essential (primary) hypertension 05/16/2015  . Fatty infiltration of liver 03/15/2015  . Aortic valve stenosis, nonrheumatic 01/18/2015  . Sciatica of right side 01/09/2015  . Type 2 diabetes mellitus (HCC) 11/10/2014  .  RLS (restless legs syndrome) 08/23/2014  . Neuritis or radiculitis due to rupture of lumbar intervertebral disc 06/13/2014  . Degeneration of intervertebral disc of lumbar region 06/13/2014  . Arthritis 01/23/2014  . Arthritis of knee, degenerative 11/15/2013  . Depression 09/29/2013  . Insomnia 09/29/2013  . Apnea, sleep 08/29/2013  . Calculus of kidney 08/29/2013  . Abnormal presence of protein in urine 08/29/2013  . Headache, migraine 08/29/2013  . BP (high blood pressure) 08/29/2013  . HLD (hyperlipidemia) 08/29/2013  . Allergic rhinitis 08/29/2013  . Intractable migraine without aura 02/18/2013    Beacher May PT 02/06/2017, 5:55 PM  [place] [facility] PHYSICAL AND SPORTS MEDICINE [address] [place], [address] Phone: [phone]   Fax:  [phone]  Name: Jessica Cole MRN: [id number] Date of Birth: 06/03/1950

## 2017-02-09 ENCOUNTER — Encounter: Payer: Self-pay | Admitting: Physical Therapy

## 2017-02-09 ENCOUNTER — Ambulatory Visit: Payer: Medicare HMO | Admitting: Physical Therapy

## 2017-02-09 DIAGNOSIS — M25512 Pain in left shoulder: Secondary | ICD-10-CM

## 2017-02-09 DIAGNOSIS — M6281 Muscle weakness (generalized): Secondary | ICD-10-CM

## 2017-02-09 NOTE — Therapy (Signed)
[place] [facility] PH[facility] MEDICINE [address] [place], [address] Phone: [phone]   Fax:  [phone]  Physical Therapy Treatment  Patient Details  Name: Jessica Cole MRN: [id number] Date of Birth: 1951-03-17 Referring Provider: Jena Gauss., MD  Encounter Date: 02/09/2017      PT End of Session - 02/09/17 1200    Visit Number 3   Number of Visits 12   Date for PT Re-Evaluation 03/17/17   Authorization Type 3   Authorization Time Period 10 G code   PT Start Time 1155   PT Stop Time 1230   PT Time Calculation (min) 35 min   Activity Tolerance Patient tolerated treatment well;Patient limited by pain   Behavior During Therapy [facility] for tasks assessed/performed      Past Medical History:  Diagnosis Date  . Anxiety   . Colon polyps   . Degenerative arthritis   . Depression   . Diabetes mellitus without complication (HCC)    Type II  . Environmental allergies   . Family history of adverse reaction to anesthesia    sister- PONV  . Fatty liver   . Glaucoma   . Headache(784.0)    HX  MIGRAINES  . History of bronchitis   . History of kidney stones   . History of pneumonia   . Hypertension   . Hypothyroidism   . Obesity   . OSA on CPAP   . Plantar fasciitis    right  . PONV (postoperative nausea and vomiting)   . Renal calculi   . Renal calculi   . RLS (restless legs syndrome) 08/23/2014    Past Surgical History:  Procedure Laterality Date  . ABDOMINAL HYSTERECTOMY     partial  . APPENDECTOMY    . Arthroscopic surgery, knee Left   . BREAST BIOPSY Right    several  . BUNIONECTOMY     LEFT   . COLONOSCOPY W/ POLYPECTOMY    . FOOT SURGERY     RIGHT    . KNEE ARTHROSCOPY W/ MENISCAL REPAIR Left   . LASIK    . MAXIMUM ACCESS (MAS)POSTERIOR LUMBAR INTERBODY FUSION (PLIF) 1 LEVEL N/A 04/25/2016   Procedure: LUMBAR FOUR-FIVE  MAXIMUM ACCESS (MAS) POSTERIOR LUMBAR INTERBODY FUSION (PLIF) with extension  of instrumentation LUMBAR TWO-FIVE;  Surgeon: Tia Alert, MD;  Location: [facility] OR;  Service: Neurosurgery;  Laterality: N/A;  . MAXIMUM ACCESS (MAS)POSTERIOR LUMBAR INTERBODY FUSION (PLIF) 2 LEVEL N/A 12/13/2015   Procedure: Lumbar two-three - Lumbar three-four MAXIMUM ACCESS (MAS) POSTERIOR LUMBAR INTERBODY FUSION (PLIF)  ;  Surgeon: Tia Alert, MD;  Location: [facility] NEURO ORS;  Service: Neurosurgery;  Laterality: N/A;  . SHOULDER SURGERY     RIGHT   . TONSILLECTOMY      There were no vitals filed for this visit.      Subjective Assessment - 02/09/17 1158    Subjective Patient reports her left shoulder is feeling better and her bruising is improving along upper arm. She is exercising as instructed and feels stiffness in upper back and neck today.    Pertinent History insidious onset of pain over the past couple of months that is limiting her movement and personal care with raising her arm above shoulder level   Limitations Lifting;House hold activities;Other (comment)  raising arm above shoulder   Patient Stated Goals to be able to use left arm for daily tasks without stiffness or catching feeling   Currently in  Pain? No/denies         Objective: Observation: ecchymosis with differing colors, for different stages of healing, (patient reports this was worse the day following exam at MD office 01/13/2017) along left upper arm 4-41/2 " height and 7" horizontal around arm, decreased intensity by 50% from previous visit. Palpation: + spasms bilateral upper trapezius and cervical spine paraspinal muscles  AROM: bilateral shoulders WNL with mild stiffness reported end range elevation left UE Posture; forward head with forward rounded shoulders, left > right  Treatment:  Modalities:  Electrical stimulation:15 min. Russian stim. 10/10 cycle applied (2) electrodes to each lower trapezius and rhomboids with patient seated with UE's supported on pillows.goal muscle re education   Manual  therapy: 10 min.: goal: improve soft tissue elasticity, ROM STM performed to cervical spine, upper trapezius muscles with patient seated in chair followed by russian stim  Patient response to treatment: Patient demonstrated improved motor control with peri scapular muscles following estim., decreased spasm and tenderness by >50% following STM            PT Education - 02/09/17 1233    Education provided Yes   Education Details re assessed home exercises   Person(s) Educated Patient   Methods Explanation;Verbal cues   Comprehension Verbalized understanding;Verbal cues required             PT Long Term Goals - 02/03/17 1434      PT LONG TERM GOAL #1   Title Patient will improve function wiht left shoulder as indicated by QuickDash score of 20% or less, demonstratrating improved personal care and abiltiy to perform activties above shoulder level   Baseline quickDash 30%   Status New   Target Date 03/17/17     PT LONG TERM GOAL #2   Title  Patient will be independent with home program for pain control, exercises for flexibility and strength to allow transition to self management once discharged from physical therapy   Baseline limited knowledge of appropriate pain control, exercises and progression   Status New   Target Date 03/17/17               Plan - 02/09/17 1201    Clinical Impression Statement Patient demonstrated improved motor control following electrical stimulation. She is improving with scapular control and functional use left UE. She requires guidance and cuing to improve posture and scapular control and will benefit from continued physical therapy intervention to achieve goals.   Rehab Potential Good   Clinical Impairments Affecting Rehab Potential (+) motivated, acute condition (-) carpal tunnel symtpoms bilaterally, diabetes   PT Frequency 2x / week   PT Duration 6 weeks   PT Treatment/Interventions Manual techniques;Electrical Stimulation;Moist  Heat;Patient/family education;Cryotherapy;Therapeutic exercise;Dry needling;Ultrasound   PT Next Visit Plan pain control, therapeutic exercise   PT Home Exercise Plan posture awareness, scapular control exercises, ROM      Patient will benefit from skilled therapeutic intervention in order to improve the following deficits and impairments:  Pain, Impaired flexibility, Difficulty walking, Increased muscle spasms, Decreased strength, Impaired perceived functional ability, Decreased activity tolerance, Decreased range of motion  Visit Diagnosis: Left shoulder pain, unspecified chronicity  Muscle weakness (generalized)     Problem List Patient Active Problem List   Diagnosis Date Noted  . S/P lumbar spinal fusion 12/13/2015  . Controlled type 2 diabetes mellitus without complication (HCC) 05/16/2015  . Thrombocytopenia (HCC) 05/16/2015  . Recurrent major depressive disorder, in full remission (HCC) 05/16/2015  . Essential (primary) hypertension 05/16/2015  .  Fatty infiltration of liver 03/15/2015  . Aortic valve stenosis, nonrheumatic 01/18/2015  . Sciatica of right side 01/09/2015  . Type 2 diabetes mellitus (HCC) 11/10/2014  . RLS (restless legs syndrome) 08/23/2014  . Neuritis or radiculitis due to rupture of lumbar intervertebral disc 06/13/2014  . Degeneration of intervertebral disc of lumbar region 06/13/2014  . Arthritis 01/23/2014  . Arthritis of knee, degenerative 11/15/2013  . Depression 09/29/2013  . Insomnia 09/29/2013  . Apnea, sleep 08/29/2013  . Calculus of kidney 08/29/2013  . Abnormal presence of protein in urine 08/29/2013  . Headache, migraine 08/29/2013  . BP (high blood pressure) 08/29/2013  . HLD (hyperlipidemia) 08/29/2013  . Allergic rhinitis 08/29/2013  . Intractable migraine without aura 02/18/2013    Beacher May PT 02/10/2017, 12:58 PM  [place] [facility] PHYSICAL AND SPORTS MEDICINE [address]  [place], [address] Phone: [phone]   Fax:  [phone]  Name: Jessica Cole MRN: [id number] Date of Birth: 02/28/1951

## 2017-02-12 ENCOUNTER — Encounter: Payer: Self-pay | Admitting: Physical Therapy

## 2017-02-12 ENCOUNTER — Ambulatory Visit: Payer: Medicare HMO | Admitting: Physical Therapy

## 2017-02-12 DIAGNOSIS — M25612 Stiffness of left shoulder, not elsewhere classified: Secondary | ICD-10-CM

## 2017-02-12 DIAGNOSIS — M25512 Pain in left shoulder: Secondary | ICD-10-CM

## 2017-02-12 DIAGNOSIS — M6281 Muscle weakness (generalized): Secondary | ICD-10-CM

## 2017-02-12 NOTE — Therapy (Signed)
[place] [facility]ICINE [address] [place], [address] Phone: [phone]   Fax:  [phone]  Physical Therapy Treatment  Patient Details  Name: Jessica Cole MRN: [id number] Date of Birth: 06/04/1950 Referring Provider: Jena Gauss., MD  Encounter Date: 02/12/2017      PT End of Session - 02/12/17 1352    Visit Number 4   Number of Visits 12   Date for PT Re-Evaluation 03/17/17   Authorization Type 4   Authorization Time Period 10 G code   PT Start Time 1345   PT Stop Time 1430   PT Time Calculation (min) 45 min   Activity Tolerance Patient tolerated treatment well;Patient limited by pain   Behavior During Therapy [facility] for tasks assessed/performed      Past Medical History:  Diagnosis Date  . Anxiety   . Colon polyps   . Degenerative arthritis   . Depression   . Diabetes mellitus without complication (HCC)    Type II  . Environmental allergies   . Family history of adverse reaction to anesthesia    sister- PONV  . Fatty liver   . Glaucoma   . Headache(784.0)    HX  MIGRAINES  . History of bronchitis   . History of kidney stones   . History of pneumonia   . Hypertension   . Hypothyroidism   . Obesity   . OSA on CPAP   . Plantar fasciitis    right  . PONV (postoperative nausea and vomiting)   . Renal calculi   . Renal calculi   . RLS (restless legs syndrome) 08/23/2014    Past Surgical History:  Procedure Laterality Date  . ABDOMINAL HYSTERECTOMY     partial  . APPENDECTOMY    . Arthroscopic surgery, knee Left   . BREAST BIOPSY Right    several  . BUNIONECTOMY     LEFT   . COLONOSCOPY W/ POLYPECTOMY    . FOOT SURGERY     RIGHT    . KNEE ARTHROSCOPY W/ MENISCAL REPAIR Left   . LASIK    . MAXIMUM ACCESS (MAS)POSTERIOR LUMBAR INTERBODY FUSION (PLIF) 1 LEVEL N/A 04/25/2016   Procedure: LUMBAR FOUR-FIVE  MAXIMUM ACCESS (MAS) POSTERIOR LUMBAR INTERBODY FUSION (PLIF) with extension  of instrumentation LUMBAR TWO-FIVE;  Surgeon: Tia Alert, MD;  Location: [facility] OR;  Service: Neurosurgery;  Laterality: N/A;  . MAXIMUM ACCESS (MAS)POSTERIOR LUMBAR INTERBODY FUSION (PLIF) 2 LEVEL N/A 12/13/2015   Procedure: Lumbar two-three - Lumbar three-four MAXIMUM ACCESS (MAS) POSTERIOR LUMBAR INTERBODY FUSION (PLIF)  ;  Surgeon: Tia Alert, MD;  Location: [facility] NEURO ORS;  Service: Neurosurgery;  Laterality: N/A;  . SHOULDER SURGERY     RIGHT   . TONSILLECTOMY      There were no vitals filed for this visit.      Subjective Assessment - 02/12/17 1348    Subjective Patient reports her left shoulder is feeling better and her bruising continues to improve.    Pertinent History insidious onset of pain over the past couple of months that is limiting her movement and personal care with raising her arm above shoulder level   Limitations Lifting;House hold activities;Other (comment)  raising arm above shoulder   Patient Stated Goals to be able to use left arm for daily tasks without stiffness or catching feeling   Currently in Pain? No/denies  lower back right side only        Objective:  Observation: ecchymosis with differing colors, for different stages of healing,  along left upper arm 41/2 " height and 41/2" horizontal around arm, decreased intensity by 50% from previous visit. Palpation: + spasms bilateral upper trapezius and cervical spine paraspinal muscles; tender bilateral AC joints both shoulders   Treatment:  Modalities:  Electrical stimulation:15 min. Russian stim. 10/10 cycle applied (2) electrodes to each lower trapezius and rhomboids with patient seated with UE's supported on pillows.goal muscle re education  Manual therapy: 10 min.: goal: improve soft tissue elasticity, ROM STM performed to cervical spine, upper trapezius muscles and suboccipital release with patient supine lying followed by exercise and russian stim  Therapeutic exercise; patient performed  exercises with instruction, verbal and tactile cues of therapist; goal; independent with home program, improve quickDash  Supine lying;  Rhythmic stabilization with each UE at 90 degrees flexion and neutral rotations 10 reps 2 sets each Side lying: Scapular stabilization/motor control exercises elevation/depresison and protraction/retraction x 10 reps each  Patient response to treatment: Patient demonstrated improved motor control and technique with tactile cues and minimal VC. Improved soft tissue elasticity and decreased spasms and tenderness by 50% following STM and with electrical stimulation.       PT Education - 02/12/17 1349    Education provided Yes   Education Details re assessed exercises   Person(s) Educated Patient   Methods Explanation;Verbal cues   Comprehension Verbalized understanding;Verbal cues required             PT Long Term Goals - 02/03/17 1434      PT LONG TERM GOAL #1   Title Patient will improve function wiht left shoulder as indicated by QuickDash score of 20% or less, demonstratrating improved personal care and abiltiy to perform activties above shoulder level   Baseline quickDash 30%   Status New   Target Date 03/17/17     PT LONG TERM GOAL #2   Title  Patient will be independent with home program for pain control, exercises for flexibility and strength to allow transition to self management once discharged from physical therapy   Baseline limited knowledge of appropriate pain control, exercises and progression   Status New   Target Date 03/17/17               Plan - 02/12/17 1353    Clinical Impression Statement Patient demonstrates improving motor control following electrical stimulation. She continues with spasms and decreased strength in upper back/periscapularmuscles and will benefit from continued physical therapy intervention to achieve goals .   Rehab Potential Good   Clinical Impairments Affecting Rehab Potential (+) motivated,  acute condition (-) carpal tunnel symtpoms bilaterally, diabetes   PT Frequency 2x / week   PT Duration 6 weeks   PT Treatment/Interventions Manual techniques;Electrical Stimulation;Moist Heat;Patient/family education;Cryotherapy;Therapeutic exercise;Dry needling;Ultrasound   PT Next Visit Plan pain control, therapeutic exercise   PT Home Exercise Plan posture awareness, scapular control exercises, ROM      Patient will benefit from skilled therapeutic intervention in order to improve the following deficits and impairments:  Pain, Impaired flexibility, Difficulty walking, Increased muscle spasms, Decreased strength, Impaired perceived functional ability, Decreased activity tolerance, Decreased range of motion  Visit Diagnosis: Left shoulder pain, unspecified chronicity  Muscle weakness (generalized)  Stiffness of left shoulder, not elsewhere classified     Problem List Patient Active Problem List   Diagnosis Date Noted  . S/P lumbar spinal fusion 12/13/2015  . Controlled type 2 diabetes mellitus without complication (HCC) 05/16/2015  . Thrombocytopenia (  HCC) 05/16/2015  . Recurrent major depressive disorder, in full remission (HCC) 05/16/2015  . Essential (primary) hypertension 05/16/2015  . Fatty infiltration of liver 03/15/2015  . Aortic valve stenosis, nonrheumatic 01/18/2015  . Sciatica of right side 01/09/2015  . Type 2 diabetes mellitus (HCC) 11/10/2014  . RLS (restless legs syndrome) 08/23/2014  . Neuritis or radiculitis due to rupture of lumbar intervertebral disc 06/13/2014  . Degeneration of intervertebral disc of lumbar region 06/13/2014  . Arthritis 01/23/2014  . Arthritis of knee, degenerative 11/15/2013  . Depression 09/29/2013  . Insomnia 09/29/2013  . Apnea, sleep 08/29/2013  . Calculus of kidney 08/29/2013  . Abnormal presence of protein in urine 08/29/2013  . Headache, migraine 08/29/2013  . BP (high blood pressure) 08/29/2013  . HLD (hyperlipidemia)  08/29/2013  . Allergic rhinitis 08/29/2013  . Intractable migraine without aura 02/18/2013    Beacher May PT 02/13/2017, 12:15 PM  [place] [facility] PHYSICAL AND SPORTS MEDICINE [address] [place], [address] Phone: [phone]   Fax:  [phone]  Name: CHANEKA TREFZ MRN: [id number] Date of Birth: 1950/09/30

## 2017-02-16 ENCOUNTER — Other Ambulatory Visit: Payer: Self-pay | Admitting: Internal Medicine

## 2017-02-16 ENCOUNTER — Encounter: Payer: Self-pay | Admitting: Physical Therapy

## 2017-02-16 ENCOUNTER — Ambulatory Visit: Payer: Medicare HMO | Admitting: Physical Therapy

## 2017-02-16 DIAGNOSIS — Z1231 Encounter for screening mammogram for malignant neoplasm of breast: Secondary | ICD-10-CM

## 2017-02-16 DIAGNOSIS — M62838 Other muscle spasm: Secondary | ICD-10-CM

## 2017-02-16 DIAGNOSIS — M6281 Muscle weakness (generalized): Secondary | ICD-10-CM | POA: Diagnosis not present

## 2017-02-16 DIAGNOSIS — M25512 Pain in left shoulder: Secondary | ICD-10-CM

## 2017-02-16 NOTE — Therapy (Signed)
[facility] PHYSICAL AND SPORTS MEDICINE [address], [place], [address] Phone: [phone]   Fax:  [phone]  Physical Therapy Treatment  Patient Details  Name: Jessica Cole MRN: [id number] Date of Birth: 1950-09-13 Referring Provider: Jena Gauss., MD  Encounter Date: 02/16/2017      PT End of Session - 02/16/17 1356    Visit Number 5   Number of Visits 12   Date for PT Re-Evaluation 03/17/17   Authorization Type 5   Authorization Time Period 10 G code   PT Start Time 1352   PT Stop Time 1432   PT Time Calculation (min) 40 min   Activity Tolerance Patient tolerated treatment well;Patient limited by pain   Behavior During Therapy [facility] for tasks assessed/performed      Past Medical History:  Diagnosis Date  . Anxiety   . Colon polyps   . Degenerative arthritis   . Depression   . Diabetes mellitus without complication (HCC)    Type II  . Environmental allergies   . Family history of adverse reaction to anesthesia    sister- PONV  . Fatty liver   . Glaucoma   . Headache(784.0)    HX  MIGRAINES  . History of bronchitis   . History of kidney stones   . History of pneumonia   . Hypertension   . Hypothyroidism   . Obesity   . OSA on CPAP   . Plantar fasciitis    right  . PONV (postoperative nausea and vomiting)   . Renal calculi   . Renal calculi   . RLS (restless legs syndrome) 08/23/2014    Past Surgical History:  Procedure Laterality Date  . ABDOMINAL HYSTERECTOMY     partial  . APPENDECTOMY    . Arthroscopic surgery, knee Left   . BREAST BIOPSY Right    several  . BUNIONECTOMY     LEFT   . COLONOSCOPY W/ POLYPECTOMY    . FOOT SURGERY     RIGHT    . KNEE ARTHROSCOPY W/ MENISCAL REPAIR Left   . LASIK    . MAXIMUM ACCESS (MAS)POSTERIOR LUMBAR INTERBODY FUSION (PLIF) 1 LEVEL N/A 04/25/2016   Procedure: LUMBAR FOUR-FIVE  MAXIMUM ACCESS (MAS) POSTERIOR LUMBAR INTERBODY FUSION (PLIF) with extension  of instrumentation LUMBAR TWO-FIVE;  Surgeon: Tia Alert, MD;  Location: [facility] OR;  Service: Neurosurgery;  Laterality: N/A;  . MAXIMUM ACCESS (MAS)POSTERIOR LUMBAR INTERBODY FUSION (PLIF) 2 LEVEL N/A 12/13/2015   Procedure: Lumbar two-three - Lumbar three-four MAXIMUM ACCESS (MAS) POSTERIOR LUMBAR INTERBODY FUSION (PLIF)  ;  Surgeon: Tia Alert, MD;  Location: [facility] NEURO ORS;  Service: Neurosurgery;  Laterality: N/A;  . SHOULDER SURGERY     RIGHT   . TONSILLECTOMY      There were no vitals filed for this visit.      Subjective Assessment - 02/16/17 1353    Subjective Patient reports she has been having migraine headaches and therfore has not done exercises. She is still having some residual eye issues today.   Pertinent History insidious onset of pain over the past couple of months that is limiting her movement and personal care with raising her arm above shoulder level   Limitations Lifting;House hold activities;Other (comment)  raising arm above shoulder   Patient Stated Goals to be able to use left arm for daily tasks without stiffness or catching feeling   Currently in Pain? Other (Comment)  not in left shoulder,  she is having more lower back and right sided pain      Objective: Observation: ecchymosis along left upper arm, improving Palpation: + spasms bilateral upper trapezius and cervical spine paraspinal muscles  Treatment:  Modalities: Electrical stimulation:15 min.Russian stim. 10/10 cycle applied (2) electrodes to each lower trapezius and rhomboids with patient seated with UE's supported on pillows.goal muscle re education; mild contraction with estim. Moist heat applied to back during electrical stimulation: goal pain, spasms; no adverse reactions noted; unbilled time  Manual therapy: 8 min.: goal: improve soft tissue elasticity, ROM STM performed to cervical spine, upper trapezius muscles and suboccipital release with patient supine lying followed by exercise and  russian stim  Therapeutic exercise; patient performed exercises with instruction, verbal and tactile cues of therapist; goal; independent with home program, improve quickDash  Supine lying;  Rhythmic stabilization with left UE at 90 degrees flexion and neutral rotations 10 reps 2 sets each Side lying right: Scapular stabilization/motor control exercises left shoulder with manual cues and resistance given for protraction/retraction x 10 reps each  Patient response to treatment: Patient demonstrated improved scapular control with tactile and VC and improved soft tissue elasticity with decreased palpable spasms to mild following STM.         PT Education - 02/16/17 1355    Education provided Yes   Education Details re assessed home exercises for stretching, scapular retraction   Person(s) Educated Patient   Methods Explanation   Comprehension Verbalized understanding;Returned demonstration;Tactile cues required             PT Long Term Goals - 02/03/17 1434      PT LONG TERM GOAL #1   Title Patient will improve function wiht left shoulder as indicated by QuickDash score of 20% or less, demonstratrating improved personal care and abiltiy to perform activties above shoulder level   Baseline quickDash 30%   Status New   Target Date 03/17/17     PT LONG TERM GOAL #2   Title  Patient will be independent with home program for pain control, exercises for flexibility and strength to allow transition to self management once discharged from physical therapy   Baseline limited knowledge of appropriate pain control, exercises and progression   Status New   Target Date 03/17/17               Plan - 02/16/17 1641    Clinical Impression Statement Patient demonstrates improvement with using left UE for functional tasks and is responding well to electreical stimulation and exercises. She is limited in ability to perform exercises today due to headache.    Rehab Potential Good    Clinical Impairments Affecting Rehab Potential (+) motivated, acute condition (-) carpal tunnel symtpoms bilaterally, diabetes   PT Frequency 2x / week   PT Duration 6 weeks   PT Treatment/Interventions Manual techniques;Electrical Stimulation;Moist Heat;Patient/family education;Cryotherapy;Therapeutic exercise;Dry needling;Ultrasound   PT Next Visit Plan pain control, therapeutic exercise; electrical stimulation for muscle re education   PT Home Exercise Plan posture awareness, scapular control exercises, ROM      Patient will benefit from skilled therapeutic intervention in order to improve the following deficits and impairments:  Pain, Impaired flexibility, Difficulty walking, Increased muscle spasms, Decreased strength, Impaired perceived functional ability, Decreased activity tolerance, Decreased range of motion  Visit Diagnosis: Left shoulder pain, unspecified chronicity  Muscle weakness (generalized)  Other muscle spasm     Problem List Patient Active Problem List   Diagnosis Date Noted  . S/P lumbar spinal  fusion 12/13/2015  . Controlled type 2 diabetes mellitus without complication (HCC) 05/16/2015  . Thrombocytopenia (HCC) 05/16/2015  . Recurrent major depressive disorder, in full remission (HCC) 05/16/2015  . Essential (primary) hypertension 05/16/2015  . Fatty infiltration of liver 03/15/2015  . Aortic valve stenosis, nonrheumatic 01/18/2015  . Sciatica of right side 01/09/2015  . Type 2 diabetes mellitus (HCC) 11/10/2014  . RLS (restless legs syndrome) 08/23/2014  . Neuritis or radiculitis due to rupture of lumbar intervertebral disc 06/13/2014  . Degeneration of intervertebral disc of lumbar region 06/13/2014  . Arthritis 01/23/2014  . Arthritis of knee, degenerative 11/15/2013  . Depression 09/29/2013  . Insomnia 09/29/2013  . Apnea, sleep 08/29/2013  . Calculus of kidney 08/29/2013  . Abnormal presence of protein in urine 08/29/2013  . Headache, migraine  08/29/2013  . BP (high blood pressure) 08/29/2013  . HLD (hyperlipidemia) 08/29/2013  . Allergic rhinitis 08/29/2013  . Intractable migraine without aura 02/18/2013    Beacher May PT 02/16/2017, 4:44 PM  [facility] PHYSICAL AND SPORTS MEDICINE [address], [place], [address] Phone: [phone]   Fax:  [phone]  Name: Jessica Cole MRN: [id number] Date of Birth: 1950/08/23

## 2017-02-19 ENCOUNTER — Ambulatory Visit: Payer: Medicare HMO | Admitting: Physical Therapy

## 2017-02-19 ENCOUNTER — Encounter: Payer: Self-pay | Admitting: Physical Therapy

## 2017-02-19 DIAGNOSIS — M25612 Stiffness of left shoulder, not elsewhere classified: Secondary | ICD-10-CM

## 2017-02-19 DIAGNOSIS — M6281 Muscle weakness (generalized): Secondary | ICD-10-CM

## 2017-02-19 DIAGNOSIS — M62838 Other muscle spasm: Secondary | ICD-10-CM

## 2017-02-19 DIAGNOSIS — M25512 Pain in left shoulder: Secondary | ICD-10-CM

## 2017-02-19 NOTE — Therapy (Signed)
[facility] PHYSICAL AND SPORTS MEDICINE [address] [place], [place], [address] Phone: [phone]   Fax:  [phone]  Physical Therapy Treatment  Patient Details  Name: Nobie PutnamGail T Elmore MRN: [id number] Date of Birth: [birth year] Referring Provider: Jena GaussHooten, James P Jr., MD  Encounter Date: 02/19/2017      PT End of Session - 02/19/17 1306    Visit Number 6   Number of Visits 12   Date for PT Re-Evaluation 03/17/17   Authorization Type 6   Authorization Time Period 10 G code   PT Start Time 1303   PT Stop Time 1345   PT Time Calculation (min) 42 min   Activity Tolerance Patient tolerated treatment well;Patient limited by pain   Behavior [facility] for tasks assessed/performed      Past Medical History:  Diagnosis Date  . Anxiety   . Colon polyps   . Degenerative arthritis   . Depression   . Diabetes mellitus without complication (HCC)    Type II  . Environmental allergies   . Family history of adverse reaction to anesthesia    sister- PONV  . Fatty liver   . Glaucoma   . Headache(784.0)    HX  MIGRAINES  . History of bronchitis   . History of kidney stones   . History of pneumonia   . Hypertension   . Hypothyroidism   . Obesity   . OSA on CPAP   . Plantar fasciitis    right  . PONV (postoperative nausea and vomiting)   . Renal calculi   . Renal calculi   . RLS (restless legs syndrome) 08/23/2014    Past Surgical History:  Procedure Laterality Date  . ABDOMINAL HYSTERECTOMY     partial  . APPENDECTOMY    . Arthroscopic surgery, knee Left   . BREAST BIOPSY Right    several  . BUNIONECTOMY     LEFT   . COLONOSCOPY W/ POLYPECTOMY    . FOOT SURGERY     RIGHT    . KNEE ARTHROSCOPY W/ MENISCAL REPAIR Left   . LASIK    . MAXIMUM ACCESS (MAS)POSTERIOR LUMBAR INTERBODY FUSION (PLIF) 1 LEVEL N/A 04/25/2016   Procedure: LUMBAR FOUR-FIVE  MAXIMUM ACCESS (MAS) POSTERIOR LUMBAR INTERBODY FUSION (PLIF) with extension  of instrumentation LUMBAR TWO-FIVE;  Surgeon: Tia Alertavid S Jones, MD;  [facility];  Service: Neurosurgery;  Laterality: N/A;  . MAXIMUM ACCESS (MAS)POSTERIOR LUMBAR INTERBODY FUSION (PLIF) 2 LEVEL N/A 12/13/2015   Procedure: Lumbar two-three - Lumbar three-four MAXIMUM ACCESS (MAS) POSTERIOR LUMBAR INTERBODY FUSION (PLIF)  ;  Surgeon: Tia Alertavid S Jones, MD;  [facility];  Service: Neurosurgery;  Laterality: N/A;  . SHOULDER SURGERY     RIGHT   . TONSILLECTOMY      There were no vitals filed for this visit.      Subjective Assessment - 02/19/17 1304    Subjective Patient reports she is tired and has had a death in the family this week. She is having decreased intensity of headache and stiffness in her neck. Left shoulder is feeling stiff today which may be due to the onset of cold weather.    Pertinent History insidious onset of pain over the past couple of months that is limiting her movement and personal care with raising her arm above shoulder level   Limitations Lifting;House hold activities;Other (comment)  raising arm above shoulder   Patient Stated Goals to be able to use left  arm for daily tasks without stiffness or catching feeling        Objective: Observation: mild ecchymosis along left upper arm, improving Palpation: + spasms bilateral upper trapezius and cervical spine paraspinal muscles  Treatment:  Modalities: Electrical stimulation:15 min.Russian stim. 10/10 cycle applied (2) electrodes to each lower trapezius and rhomboids with patient seated with UE's supported on pillows.goal muscle re education; mild contraction with estim. Moist heat applied to back during electrical stimulation: goal pain, spasms; no adverse reactions noted; unbilled time  Manual therapy: 10 min.: goal: improve soft tissue elasticity, ROM STM performed to cervical spine, upper trapezius muscles and suboccipital releasewith patient supine lyingfollowed by exercise andrussian  stim  Therapeutic exercise;patient performed exercises with instruction, verbal and tactile cues of therapist; goal; independent with home program, improve quickDash  Supine lying;  Rhythmic stabilization with left UE at 90 degrees flexion and neutral rotations 10 reps 2 sets each Side lying right: Scapular stabilization/motor control exercises left shoulder with manual cues and resistance given for protraction/retraction x 10 reps each  Patient response to treatment: Patient demonstrated improved motor control left shoulder with exercises with minimal cuing and assistance for correct alignment. improved activation of scapular retraction with electrical stimulation. improved soft tissue elasticity by 50% following STM.           PT Education - 02/19/17 1340    Education provided Yes   Education Details exercise instruction   Person(s) Educated Patient   Methods Explanation;Verbal cues   Comprehension Verbalized understanding;Verbal cues required             PT Long Term Goals - 02/03/17 1434      PT LONG TERM GOAL #1   Title Patient will improve function wiht left shoulder as indicated by QuickDash score of 20% or less, demonstratrating improved personal care and abiltiy to perform activties above shoulder level   Baseline quickDash 30%   Status New   Target Date 03/17/17     PT LONG TERM GOAL #2   Title  Patient will be independent with home program for pain control, exercises for flexibility and strength to allow transition to self management once discharged from physical therapy   Baseline limited knowledge of appropriate pain control, exercises and progression   Status New   Target Date 03/17/17               Plan - 02/19/17 1337    Clinical Impression Statement Patient is progressing with improvement noted in strength, ROM and is responding well to electrical stimulation and exercises. She is progressing slowly due to having migraines, stress in family  and now a death in her family. she is motivated and should continue to benefit from physical therapy intervention to maximize funstion with minimal to no left shoulder pain/stiffness.   Rehab Potential Good   Clinical Impairments Affecting Rehab Potential (+) motivated, acute condition (-) carpal tunnel symtpoms bilaterally, diabetes   PT Frequency 2x / week   PT Duration 6 weeks   PT Treatment/Interventions Manual techniques;Electrical Stimulation;Moist Heat;Patient/family education;Cryotherapy;Therapeutic exercise;Dry needling;Ultrasound   PT Next Visit Plan pain control, therapeutic exercise; electrical stimulation for muscle re education   PT Home Exercise Plan posture awareness, scapular control exercises, ROM      Patient will benefit from skilled therapeutic intervention in order to improve the following deficits and impairments:  Pain, Impaired flexibility, Difficulty walking, Increased muscle spasms, Decreased strength, Impaired perceived functional ability, Decreased activity tolerance, Decreased range of motion  Visit Diagnosis: Left shoulder  pain, unspecified chronicity  Muscle weakness (generalized)  Other muscle spasm  Stiffness of left shoulder, not elsewhere classified     Problem List Patient Active Problem List   Diagnosis Date Noted  . S/P lumbar spinal fusion 12/13/2015  . Controlled type 2 diabetes mellitus without complication (HCC) 05/16/2015  . Thrombocytopenia (HCC) 05/16/2015  . Recurrent major depressive disorder, in full remission (HCC) 05/16/2015  . Essential (primary) hypertension 05/16/2015  . Fatty infiltration of liver 03/15/2015  . Aortic valve stenosis, nonrheumatic 01/18/2015  . Sciatica of right side 01/09/2015  . Type 2 diabetes mellitus (HCC) 11/10/2014  . RLS (restless legs syndrome) 08/23/2014  . Neuritis or radiculitis due to rupture of lumbar intervertebral disc 06/13/2014  . Degeneration of intervertebral disc of lumbar region  06/13/2014  . Arthritis 01/23/2014  . Arthritis of knee, degenerative 11/15/2013  . Depression 09/29/2013  . Insomnia 09/29/2013  . Apnea, sleep 08/29/2013  . Calculus of kidney 08/29/2013  . Abnormal presence of protein in urine 08/29/2013  . Headache, migraine 08/29/2013  . BP (high blood pressure) 08/29/2013  . HLD (hyperlipidemia) 08/29/2013  . Allergic rhinitis 08/29/2013  . Intractable migraine without aura 02/18/2013    Beacher May PT 02/20/2017, 12:13 PM  [place] [facility] PHYSICAL AND SPORTS MEDICINE [address] [place], [address] Phone: [phone]   Fax:  [phone]  Name: ULLA MCKIERNAN MRN: [id number] Date of Birth: [birth year]

## 2017-02-23 ENCOUNTER — Encounter: Payer: Self-pay | Admitting: Physical Therapy

## 2017-02-23 ENCOUNTER — Ambulatory Visit: Payer: Medicare HMO | Admitting: Physical Therapy

## 2017-02-23 DIAGNOSIS — M6281 Muscle weakness (generalized): Secondary | ICD-10-CM

## 2017-02-23 DIAGNOSIS — M62838 Other muscle spasm: Secondary | ICD-10-CM

## 2017-02-23 DIAGNOSIS — M25612 Stiffness of left shoulder, not elsewhere classified: Secondary | ICD-10-CM

## 2017-02-23 DIAGNOSIS — M25512 Pain in left shoulder: Secondary | ICD-10-CM

## 2017-02-23 NOTE — Therapy (Signed)
[place] [facility] PHYSICAL AND SPORTS MEDICINE [address] [place], [place], [address] Phone: [phone]   Fax:  [phone]  Physical Therapy Treatment  Patient Details  Name: Jessica Cole MRN: [id number] Date of Birth: January 24, 1951 Referring Provider: Jena GaussHooten, James P Jr., MD  Encounter Date: 02/23/2017      PT End of Session - 02/23/17 1413    Visit Number 7   Number of Visits 12   Date for PT Re-Evaluation 03/17/17   Authorization Type 7   Authorization Time Period 10 G code   PT Start Time 1405   PT Stop Time 1444   PT Time Calculation (min) 39 min   Activity Tolerance Patient tolerated treatment well;Patient limited by pain   Behavior During Therapy [facility] for tasks assessed/performed      Past Medical History:  Diagnosis Date  . Anxiety   . Colon polyps   . Degenerative arthritis   . Depression   . Diabetes mellitus without complication (HCC)    Type II  . Environmental allergies   . Family history of adverse reaction to anesthesia    sister- PONV  . Fatty liver   . Glaucoma   . Headache(784.0)    HX  MIGRAINES  . History of bronchitis   . History of kidney stones   . History of pneumonia   . Hypertension   . Hypothyroidism   . Obesity   . OSA on CPAP   . Plantar fasciitis    right  . PONV (postoperative nausea and vomiting)   . Renal calculi   . Renal calculi   . RLS (restless legs syndrome) 08/23/2014    Past Surgical History:  Procedure Laterality Date  . ABDOMINAL HYSTERECTOMY     partial  . APPENDECTOMY    . Arthroscopic surgery, knee Left   . BREAST BIOPSY Right    several  . BUNIONECTOMY     LEFT   . COLONOSCOPY W/ POLYPECTOMY    . FOOT SURGERY     RIGHT    . KNEE ARTHROSCOPY W/ MENISCAL REPAIR Left   . LASIK    . MAXIMUM ACCESS (MAS)POSTERIOR LUMBAR INTERBODY FUSION (PLIF) 1 LEVEL N/A 04/25/2016   Procedure: LUMBAR FOUR-FIVE  MAXIMUM ACCESS (MAS) POSTERIOR LUMBAR INTERBODY FUSION (PLIF) with extension  of instrumentation LUMBAR TWO-FIVE;  Surgeon: Tia Alertavid S Jones, MD;  [facility];  Service: Neurosurgery;  Laterality: N/A;  . MAXIMUM ACCESS (MAS)POSTERIOR LUMBAR INTERBODY FUSION (PLIF) 2 LEVEL N/A 12/13/2015   Procedure: Lumbar two-three - Lumbar three-four MAXIMUM ACCESS (MAS) POSTERIOR LUMBAR INTERBODY FUSION (PLIF)  ;  Surgeon: Tia Alertavid S Jones, MD;  [facility];  Service: Neurosurgery;  Laterality: N/A;  . SHOULDER SURGERY     RIGHT   . TONSILLECTOMY      There were no vitals filed for this visit.      Subjective Assessment - 02/23/17 1410    Subjective Patient reports she has been dealing with sickness and funeral for family member since last session.   Pertinent History insidious onset of pain over the past couple of months that is limiting her movement and personal care with raising her arm above shoulder level   Limitations Lifting;House hold activities;Other (comment)  raising arm above shoulder   Patient Stated Goals to be able to use left arm for daily tasks without stiffness or catching feeling   Currently in Pain? Other (Comment)  general achiness and soreness "all over"  Objective: Observation: improving ecchymosis to very mild and localized left upper arm close to  Palpation: + spasms bilateral upper trapezius and cervical spine paraspinal muscles  Treatment:  Modalities: Electrical stimulation:15 min.Russian stim. 10/10 cycle applied (2) electrodes to each lower trapezius and rhomboids with patient seated with UE's supported on pillows.goal muscle re education; mild contraction with estim. Moist heat applied to back during electrical stimulation: goal pain, spasms; no adverse reactions noted; unbilled time  Manual therapy: .: goal: improve soft tissue elasticity, ROM STM performed to cervical spine, upper trapezius muscles and suboccipital releasewith patient supine lyingfollowed by exercise andrussian stim  Therapeutic  exercise;patient performed exercises with instruction, verbal and tactile cues of therapist; goal; independent with home program, improve quickDash  Supine lying;  Rhythmic stabilization with leftUE at 90 degrees flexion and neutral rotations 10 reps 2 sets each Side lying right: Scapular stabilization/motor control exercises left shoulder with manual cues and resistance given for protraction/retraction x 10 reps each  Patient response to treatment: Patient demonstrated improved motor control left shoulder with exercises with minimal cuing and assistance for correct alignment. improved activation of scapular retraction with electrical stimulation. improved soft tissue elasticity by 50% following STM; patient with hypersensitivity to touch/palpation throughout session.                 PT Education - 02/23/17 1445    Education provided Yes   Education Details exercise instruction   Person(s) Educated Patient   Methods Explanation;Verbal cues   Comprehension Verbalized understanding;Verbal cues required             PT Long Term Goals - 02/03/17 1434      PT LONG TERM GOAL #1   Title Patient will improve function wiht left shoulder as indicated by QuickDash score of 20% or less, demonstratrating improved personal care and abiltiy to perform activties above shoulder level   Baseline quickDash 30%   Status New   Target Date 03/17/17     PT LONG TERM GOAL #2   Title  Patient will be independent with home program for pain control, exercises for flexibility and strength to allow transition to self management once discharged from physical therapy   Baseline limited knowledge of appropriate pain control, exercises and progression   Status New   Target Date 03/17/17               Plan - 02/23/17 1452    Clinical Impression Statement Patient is progressing with decreased pain in left shoulder, improvement noted in abiltiy to contract periscapular muscles with  electrical stimulation.    Rehab Potential Good   Clinical Impairments Affecting Rehab Potential (+) motivated, acute condition (-) carpal tunnel symtpoms bilaterally, diabetes   PT Frequency 2x / week   PT Duration 6 weeks   PT Treatment/Interventions Manual techniques;Electrical Stimulation;Moist Heat;Patient/family education;Cryotherapy;Therapeutic exercise;Dry needling;Ultrasound   PT Next Visit Plan pain control, therapeutic exercise; electrical stimulation for muscle re education   PT Home Exercise Plan posture awareness, scapular control exercises, ROM      Patient will benefit from skilled therapeutic intervention in order to improve the following deficits and impairments:  Pain, Impaired flexibility, Difficulty walking, Increased muscle spasms, Decreased strength, Impaired perceived functional ability, Decreased activity tolerance, Decreased range of motion  Visit Diagnosis: Left shoulder pain, unspecified chronicity  Muscle weakness (generalized)  Other muscle spasm  Stiffness of left shoulder, not elsewhere classified     Problem List Patient Active Problem List   Diagnosis Date Noted  .  S/P lumbar spinal fusion 12/13/2015  . Controlled type 2 diabetes mellitus without complication (HCC) 05/16/2015  . Thrombocytopenia (HCC) 05/16/2015  . Recurrent major depressive disorder, in full remission (HCC) 05/16/2015  . Essential (primary) hypertension 05/16/2015  . Fatty infiltration of liver 03/15/2015  . Aortic valve stenosis, nonrheumatic 01/18/2015  . Sciatica of right side 01/09/2015  . Type 2 diabetes mellitus (HCC) 11/10/2014  . RLS (restless legs syndrome) 08/23/2014  . Neuritis or radiculitis due to rupture of lumbar intervertebral disc 06/13/2014  . Degeneration of intervertebral disc of lumbar region 06/13/2014  . Arthritis 01/23/2014  . Arthritis of knee, degenerative 11/15/2013  . Depression 09/29/2013  . Insomnia 09/29/2013  . Apnea, sleep 08/29/2013  .  Calculus of kidney 08/29/2013  . Abnormal presence of protein in urine 08/29/2013  . Headache, migraine 08/29/2013  . BP (high blood pressure) 08/29/2013  . HLD (hyperlipidemia) 08/29/2013  . Allergic rhinitis 08/29/2013  . Intractable migraine without aura 02/18/2013    Beacher May PT 02/24/2017, 2:36 PM  [facility] PHYSICAL AND SPORTS MEDICINE [address] [place], [address] Phone: [phone]   Fax:  [phone]  Name: Jessica Cole MRN: [id number] Date of Birth: 12/02/1950

## 2017-02-26 ENCOUNTER — Ambulatory Visit: Payer: Medicare HMO | Admitting: Physical Therapy

## 2017-02-26 ENCOUNTER — Encounter: Payer: Self-pay | Admitting: Physical Therapy

## 2017-02-26 DIAGNOSIS — M25512 Pain in left shoulder: Secondary | ICD-10-CM

## 2017-02-26 DIAGNOSIS — M6281 Muscle weakness (generalized): Secondary | ICD-10-CM | POA: Diagnosis not present

## 2017-02-26 NOTE — Therapy (Signed)
[place] [facility] PHYSICAL AND SPORTS MEDICINE [address] [place], [place], [address] Phone: [phone]   Fax:  [phone]  Physical Therapy Treatment  Patient Details  Name: Jessica Cole MRN: [id number] Date of Birth: 1951-04-07 Referring Provider: Jena GaussHooten, James P Jr., MD  Encounter Date: 02/26/2017      PT End of Session - 02/26/17 1357    Visit Number 8   Number of Visits 12   Date for PT Re-Evaluation 03/17/17   Authorization Type 8   Authorization Time Period 10 G code   PT Start Time 1351   PT Stop Time 1430   PT Time Calculation (min) 39 min   Activity Tolerance Patient tolerated treatment well;Patient limited by pain   Behavior [facility] tasks assessed/performed      Past Medical History:  Diagnosis Date  . Anxiety   . Colon polyps   . Degenerative arthritis   . Depression   . Diabetes mellitus without complication (HCC)    Type II  . Environmental allergies   . Family history of adverse reaction to anesthesia    sister- PONV  . Fatty liver   . Glaucoma   . Headache(784.0)    HX  MIGRAINES  . History of bronchitis   . History of kidney stones   . History of pneumonia   . Hypertension   . Hypothyroidism   . Obesity   . OSA on CPAP   . Plantar fasciitis    right  . PONV (postoperative nausea and vomiting)   . Renal calculi   . Renal calculi   . RLS (restless legs syndrome) 08/23/2014    Past Surgical History:  Procedure Laterality Date  . ABDOMINAL HYSTERECTOMY     partial  . APPENDECTOMY    . Arthroscopic surgery, knee Left   . BREAST BIOPSY Right    several  . BUNIONECTOMY     LEFT   . COLONOSCOPY W/ POLYPECTOMY    . FOOT SURGERY     RIGHT    . KNEE ARTHROSCOPY W/ MENISCAL REPAIR Left   . LASIK    . MAXIMUM ACCESS (MAS)POSTERIOR LUMBAR INTERBODY FUSION (PLIF) 1 LEVEL N/A 04/25/2016   Procedure: LUMBAR FOUR-FIVE  MAXIMUM ACCESS (MAS) POSTERIOR LUMBAR INTERBODY FUSION (PLIF) with extension  of instrumentation LUMBAR TWO-FIVE;  Surgeon: Tia Alertavid S Jones, MD;  [facility];  Service: Neurosurgery;  Laterality: N/A;  . MAXIMUM ACCESS (MAS)POSTERIOR LUMBAR INTERBODY FUSION (PLIF) 2 LEVEL N/A 12/13/2015   Procedure: Lumbar two-three - Lumbar three-four MAXIMUM ACCESS (MAS) POSTERIOR LUMBAR INTERBODY FUSION (PLIF)  ;  Surgeon: Tia Alertavid S Jones, MD;  [facility];  Service: Neurosurgery;  Laterality: N/A;  . SHOULDER SURGERY     RIGHT   . TONSILLECTOMY      There were no vitals filed for this visit.      Subjective Assessment - 02/26/17 1355    Subjective Patient reports soreness and feels stiff due to arthritis.   Pertinent History insidious onset of pain over the past couple of months that is limiting her movement and personal care with raising her arm above shoulder level   Limitations Lifting;House hold activities;Other (comment)  raising arm above shoulder   Patient Stated Goals to be able to use left arm for daily tasks without stiffness or catching feeling   Currently in Pain? Other (Comment)  general soreness       Objective: Observation: improving ecchymosis to very mild and localized left  upper arm   Treatment:  Modalities: Electrical stimulation:15 min.Russian stim. 10/10 cycle applied (2) electrodes to each lower trapezius and rhomboids with patient seated with UE's supported on pillows.goal muscle re education; mild contraction with estim. Moist heat applied to back during electrical stimulation: goal pain, spasms; no adverse reactions noted; unbilled time  Therapeutic exercise;patient performed exercises with instruction, verbal and tactile cues of therapist; goal; independent with home program, improve quickDash  Supine lying;  Rhythmic stabilization with leftUE at 90 degrees flexion and neutral rotations 10 reps 2 sets each Side lying right: Scapular stabilization/motor control exercises left shoulder with manual cues and resistance given for  protraction/retraction x 10 reps each Left shoulder ER with 3# weight x 15 reps Standing scaption at wall x 10 Standing ER at wall with supination x 10 reps  Patient response to treatment: Patient demonstrated improved technique and motor control with minimal cuing. Improved activation of scapular retraction with electrical stimulation.       PT Education - 02/26/17 1356    Education provided Yes   Education Details exercise instruction   Person(s) Educated Patient   Methods Explanation   Comprehension Verbalized understanding             PT Long Term Goals - 02/03/17 1434      PT LONG TERM GOAL #1   Title Patient will improve function wiht left shoulder as indicated by QuickDash score of 20% or less, demonstratrating improved personal care and abiltiy to perform activties above shoulder level   Baseline quickDash 30%   Status New   Target Date 03/17/17     PT LONG TERM GOAL #2   Title  Patient will be independent with home program for pain control, exercises for flexibility and strength to allow transition to self management once discharged from physical therapy   Baseline limited knowledge of appropriate pain control, exercises and progression   Status New   Target Date 03/17/17               Plan - 02/26/17 1428    Clinical Impression Statement Patient is progressing with strength and motor control left shoulder. She is limited in her function due to arthritis, back pain and has been sick over the past week.    Rehab Potential Good   Clinical Impairments Affecting Rehab Potential (+) motivated, acute condition (-) carpal tunnel symtpoms bilaterally, diabetes   PT Frequency 2x / week   PT Duration 6 weeks   PT Treatment/Interventions Manual techniques;Electrical Stimulation;Moist Heat;Patient/family education;Cryotherapy;Therapeutic exercise;Dry needling;Ultrasound   PT Next Visit Plan pain control, therapeutic exercise; electrical stimulation for muscle re  education   PT Home Exercise Plan posture awareness, scapular control exercises, ROM      Patient will benefit from skilled therapeutic intervention in order to improve the following deficits and impairments:  Pain, Impaired flexibility, Difficulty walking, Increased muscle spasms, Decreased strength, Impaired perceived functional ability, Decreased activity tolerance, Decreased range of motion  Visit Diagnosis: Left shoulder pain, unspecified chronicity  Muscle weakness (generalized)     Problem List Patient Active Problem List   Diagnosis Date Noted  . S/P lumbar spinal fusion 12/13/2015  . Controlled type 2 diabetes mellitus without complication (HCC) 05/16/2015  . Thrombocytopenia (HCC) 05/16/2015  . Recurrent major depressive disorder, in full remission (HCC) 05/16/2015  . Essential (primary) hypertension 05/16/2015  . Fatty infiltration of liver 03/15/2015  . Aortic valve stenosis, nonrheumatic 01/18/2015  . Sciatica of right side 01/09/2015  . Type 2 diabetes mellitus (  HCC) 11/10/2014  . RLS (restless legs syndrome) 08/23/2014  . Neuritis or radiculitis due to rupture of lumbar intervertebral disc 06/13/2014  . Degeneration of intervertebral disc of lumbar region 06/13/2014  . Arthritis 01/23/2014  . Arthritis of knee, degenerative 11/15/2013  . Depression 09/29/2013  . Insomnia 09/29/2013  . Apnea, sleep 08/29/2013  . Calculus of kidney 08/29/2013  . Abnormal presence of protein in urine 08/29/2013  . Headache, migraine 08/29/2013  . BP (high blood pressure) 08/29/2013  . HLD (hyperlipidemia) 08/29/2013  . Allergic rhinitis 08/29/2013  . Intractable migraine without aura 02/18/2013    Beacher May PT 02/27/2017, 6:55 PM  [place] [facility] PHYSICAL AND SPORTS MEDICINE [address] [place], [address] Phone: [phone]   Fax:  [phone]  Name: Jessica Cole MRN: [id number] Date of Birth: 1951/03/13

## 2017-03-02 ENCOUNTER — Ambulatory Visit: Payer: Medicare HMO | Admitting: Physical Therapy

## 2017-03-02 ENCOUNTER — Encounter: Payer: Self-pay | Admitting: Physical Therapy

## 2017-03-02 DIAGNOSIS — M25612 Stiffness of left shoulder, not elsewhere classified: Secondary | ICD-10-CM

## 2017-03-02 DIAGNOSIS — M6281 Muscle weakness (generalized): Secondary | ICD-10-CM | POA: Diagnosis not present

## 2017-03-02 DIAGNOSIS — M25512 Pain in left shoulder: Secondary | ICD-10-CM

## 2017-03-02 DIAGNOSIS — M62838 Other muscle spasm: Secondary | ICD-10-CM

## 2017-03-02 NOTE — Therapy (Signed)
[facility] [address] [place], [address] Phone: [phone]   Fax:  [phone]  Physical Therapy Treatment  Patient Details  Name: Jessica Cole MRN: [id number] Date of Birth: Feb 25, 1951 Referring Provider: Jena Gauss., MD  Encounter Date: 03/02/2017      PT End of Session - 03/02/17 1359    Visit Number 9   Number of Visits 12   Date for PT Re-Evaluation 03/17/17   Authorization Type 9   Authorization Time Period 10 G code   PT Start Time 1355   PT Stop Time 1438   PT Time Calculation (min) 43 min   Activity Tolerance Patient tolerated treatment well;Patient limited by pain   Behavior During Therapy [facility] for tasks assessed/performed      Past Medical History:  Diagnosis Date  . Anxiety   . Colon polyps   . Degenerative arthritis   . Depression   . Diabetes mellitus without complication (HCC)    Type II  . Environmental allergies   . Family history of adverse reaction to anesthesia    sister- PONV  . Fatty liver   . Glaucoma   . Headache(784.0)    HX  MIGRAINES  . History of bronchitis   . History of kidney stones   . History of pneumonia   . Hypertension   . Hypothyroidism   . Obesity   . OSA on CPAP   . Plantar fasciitis    right  . PONV (postoperative nausea and vomiting)   . Renal calculi   . Renal calculi   . RLS (restless legs syndrome) 08/23/2014    Past Surgical History:  Procedure Laterality Date  . ABDOMINAL HYSTERECTOMY     partial  . APPENDECTOMY    . Arthroscopic surgery, knee Left   . BREAST BIOPSY Right    several  . BUNIONECTOMY     LEFT   . COLONOSCOPY W/ POLYPECTOMY    . FOOT SURGERY     RIGHT    . KNEE ARTHROSCOPY W/ MENISCAL REPAIR Left   . LASIK    . MAXIMUM ACCESS (MAS)POSTERIOR LUMBAR INTERBODY FUSION (PLIF) 1 LEVEL N/A 04/25/2016   Procedure: LUMBAR FOUR-FIVE  MAXIMUM ACCESS (MAS) POSTERIOR LUMBAR INTERBODY FUSION (PLIF) with extension  of instrumentation LUMBAR TWO-FIVE;  Surgeon: Tia Alert, MD;  Location: [facility] OR;  Service: Neurosurgery;  Laterality: N/A;  . MAXIMUM ACCESS (MAS)POSTERIOR LUMBAR INTERBODY FUSION (PLIF) 2 LEVEL N/A 12/13/2015   Procedure: Lumbar two-three - Lumbar three-four MAXIMUM ACCESS (MAS) POSTERIOR LUMBAR INTERBODY FUSION (PLIF)  ;  Surgeon: Tia Alert, MD;  Location: [facility] NEURO ORS;  Service: Neurosurgery;  Laterality: N/A;  . SHOULDER SURGERY     RIGHT   . TONSILLECTOMY      There were no vitals filed for this visit.      Subjective Assessment - 03/02/17 1356    Subjective Patient reports she helped with [place] stew over the weekend and had to peel potatoes and wash large pans and felt increased pain/soreness in her left UE. She also had migraines over the weekend as well. Today she reports soreness in both shoulders, headache today.   Pertinent History insidious onset of pain over the past couple of months that is limiting her movement and personal care with raising her arm above shoulder level   Limitations Lifting;House hold activities;Other (comment)  raising arm above shoulder   Patient Stated Goals to be able to use  left arm for daily tasks without stiffness or catching feeling   Currently in Pain? Other (Comment)  bilateral shoulder soreness      Objective: Posture: increased forward head with rounded shoulders Palpation; point tender over left shoulder anterior aspect and pectoral region  Treatment:  Modalities: Electrical stimulation:15 min.high volt; applied (2) electrodes to each upper trapezius and rhomboids with patient seated with UE's supported on pillows.goal: spasms, pain Moist heat applied to back during electrical stimulation: goal pain, spasms; no adverse reactions noted; unbilled time for moist heat  Manual therapy: .: goal: improve soft tissue elasticity, ROM STM performed to left shoulder anterior aspect, posterior aspectwith patient supine  lyingfollowed by exercise and electrical stimulation  Therapeutic exercise;patient performed exercises with instruction, verbal and tactile cues of therapist; goal; independent with home program, improve quickDash  Supine lying;  Rhythmic stabilization with leftUE at 90 degrees flexion and neutral rotations 10 reps 2 sets each IR with red resistive band x 10 ER bilaterally with supination of forearms x 10  ER bilaterally with neutral forearm position x 10 Bilateral forward elevation with red resistive band stretched shoulder width apart x 10 reps Side lying right: Left shoulder ER with 3# weight x 15 reps  Patient response to treatment: Patient demonstrated improved technique and motor control with exercises with soreness reported with all exercises. Improved soreness to mild at end of session following estim./moist heat.            PT Education - 03/02/17 1359    Education provided Yes   Education Details exercise instruction   Person(s) Educated Patient   Methods Explanation   Comprehension Verbalized understanding             PT Long Term Goals - 02/03/17 1434      PT LONG TERM GOAL #1   Title Patient will improve function wiht left shoulder as indicated by QuickDash score of 20% or less, demonstratrating improved personal care and abiltiy to perform activties above shoulder level   Baseline quickDash 30%   Status New   Target Date 03/17/17     PT LONG TERM GOAL #2   Title  Patient will be independent with home program for pain control, exercises for flexibility and strength to allow transition to self management once discharged from physical therapy   Baseline limited knowledge of appropriate pain control, exercises and progression   Status New   Target Date 03/17/17               Plan - 03/02/17 1400    Clinical Impression Statement Patient demonstrated  improvement with ROM, decreased pain and improved motor control with treatment. Patient  has progressed slowly due to sickness, death in family and having chronic condition.  She is progressing with physical therapy intervention and will benefit from additional physical therapy intervention to progress towards independent self management.    Rehab Potential Good   Clinical Impairments Affecting Rehab Potential (+) motivated, acute condition (-) carpal tunnel symtpoms bilaterally, diabetes   PT Frequency 2x / week   PT Duration 6 weeks   PT Treatment/Interventions Manual techniques;Electrical Stimulation;Moist Heat;Patient/family education;Cryotherapy;Therapeutic exercise;Dry needling;Ultrasound   PT Next Visit Plan pain control, therapeutic exercise; electrical stimulation for muscle re education   PT Home Exercise Plan posture awareness, scapular control exercises, ROM      Patient will benefit from skilled therapeutic intervention in order to improve the following deficits and impairments:  Pain, Impaired flexibility, Difficulty walking, Increased muscle spasms, Decreased strength, Impaired  perceived functional ability, Decreased activity tolerance, Decreased range of motion  Visit Diagnosis: Left shoulder pain, unspecified chronicity  Muscle weakness (generalized)  Other muscle spasm  Stiffness of left shoulder, not elsewhere classified     Problem List Patient Active Problem List   Diagnosis Date Noted  . S/P lumbar spinal fusion 12/13/2015  . Controlled type 2 diabetes mellitus without complication (HCC) 05/16/2015  . Thrombocytopenia (HCC) 05/16/2015  . Recurrent major depressive disorder, in full remission (HCC) 05/16/2015  . Essential (primary) hypertension 05/16/2015  . Fatty infiltration of liver 03/15/2015  . Aortic valve stenosis, nonrheumatic 01/18/2015  . Sciatica of right side 01/09/2015  . Type 2 diabetes mellitus (HCC) 11/10/2014  . RLS (restless legs syndrome) 08/23/2014  . Neuritis or radiculitis due to rupture of lumbar intervertebral disc  06/13/2014  . Degeneration of intervertebral disc of lumbar region 06/13/2014  . Arthritis 01/23/2014  . Arthritis of knee, degenerative 11/15/2013  . Depression 09/29/2013  . Insomnia 09/29/2013  . Apnea, sleep 08/29/2013  . Calculus of kidney 08/29/2013  . Abnormal presence of protein in urine 08/29/2013  . Headache, migraine 08/29/2013  . BP (high blood pressure) 08/29/2013  . HLD (hyperlipidemia) 08/29/2013  . Allergic rhinitis 08/29/2013  . Intractable migraine without aura 02/18/2013    Beacher MayBrooks, Rhya Shan PT 03/02/2017, 4:31 PM  [facility] [address] [place], [place], [address] Phone: [phone]   Fax:  [phone]  Name: Jessica Cole MRN: [id number] Date of Birth: 11/11/1950

## 2017-03-05 ENCOUNTER — Ambulatory Visit: Payer: Medicare HMO | Attending: Neurological Surgery | Admitting: Physical Therapy

## 2017-03-05 ENCOUNTER — Encounter: Payer: Self-pay | Admitting: Physical Therapy

## 2017-03-05 DIAGNOSIS — M62838 Other muscle spasm: Secondary | ICD-10-CM | POA: Diagnosis present

## 2017-03-05 DIAGNOSIS — M25612 Stiffness of left shoulder, not elsewhere classified: Secondary | ICD-10-CM | POA: Diagnosis present

## 2017-03-05 DIAGNOSIS — M6281 Muscle weakness (generalized): Secondary | ICD-10-CM | POA: Insufficient documentation

## 2017-03-05 DIAGNOSIS — M25512 Pain in left shoulder: Secondary | ICD-10-CM | POA: Diagnosis present

## 2017-03-05 NOTE — Therapy (Signed)
[facility] [address] [place], [place], [address] Phone: [phone]   Fax:  [phone]  Physical Therapy Treatment  Patient Details  Name: Jessica Cole MRN: [id number] Date of Birth: 1950-08-18 Referring Provider: Jena GaussHooten, James P Jr., MD  Encounter Date: 03/05/2017      PT End of Session - 03/05/17 1528    Visit Number 10   Number of Visits 12   Date for PT Re-Evaluation 03/17/17   Authorization Type 10   Authorization Time Period 10 G code   PT Start Time 1515   PT Stop Time 1614   PT Time Calculation (min) 59 min   Activity Tolerance Patient tolerated treatment well;Patient limited by pain   Behavior [facility] Therapy [facility] for tasks assessed/performed      Past Medical History:  Diagnosis Date  . Anxiety   . Colon polyps   . Degenerative arthritis   . Depression   . Diabetes mellitus without complication (HCC)    Type II  . Environmental allergies   . Family history of adverse reaction to anesthesia    sister- PONV  . Fatty liver   . Glaucoma   . Headache(784.0)    HX  MIGRAINES  . History of bronchitis   . History of kidney stones   . History of pneumonia   . Hypertension   . Hypothyroidism   . Obesity   . OSA on CPAP   . Plantar fasciitis    right  . PONV (postoperative nausea and vomiting)   . Renal calculi   . Renal calculi   . RLS (restless legs syndrome) 08/23/2014    Past Surgical History:  Procedure Laterality Date  . ABDOMINAL HYSTERECTOMY     partial  . APPENDECTOMY    . Arthroscopic surgery, knee Left   . BREAST BIOPSY Right    several  . BUNIONECTOMY     LEFT   . COLONOSCOPY W/ POLYPECTOMY    . FOOT SURGERY     RIGHT    . KNEE ARTHROSCOPY W/ MENISCAL REPAIR Left   . LASIK    . MAXIMUM ACCESS (MAS)POSTERIOR LUMBAR INTERBODY FUSION (PLIF) 1 LEVEL N/A 04/25/2016   Procedure: LUMBAR FOUR-FIVE  MAXIMUM ACCESS (MAS) POSTERIOR LUMBAR INTERBODY FUSION (PLIF) with  extension of instrumentation LUMBAR TWO-FIVE;  Surgeon: Tia Alertavid S Jones, MD;  [facility];  Service: Neurosurgery;  Laterality: N/A;  . MAXIMUM ACCESS (MAS)POSTERIOR LUMBAR INTERBODY FUSION (PLIF) 2 LEVEL N/A 12/13/2015   Procedure: Lumbar two-three - Lumbar three-four MAXIMUM ACCESS (MAS) POSTERIOR LUMBAR INTERBODY FUSION (PLIF)  ;  Surgeon: Tia Alertavid S Jones, MD;  [facility];  Service: Neurosurgery;  Laterality: N/A;  . SHOULDER SURGERY     RIGHT   . TONSILLECTOMY      There were no vitals filed for this visit.      Subjective Assessment - 03/05/17 1523    Subjective Patient reports her left shoulder is improving with current treatment. She would like to continue with therapy to advance exercises and be guided so she does appropriate exercises.    Pertinent History insidious onset of pain over the past couple of months that is limiting her movement and personal care with raising her arm above shoulder level   Limitations Lifting;House hold activities;Other (comment)  raising arm above shoulder   Patient Stated Goals to be able to use left arm for daily tasks without stiffness or catching feeling   Currently in Pain? No/denies  Objective: Posture: increased forward head with rounded shoulders Palpation; + spasms in upper trapezius, cervical spine paraspinal muscles QuickDash: 30%  Treatment:  Modalities: Electrical stimulation:20 min.[place] Stimulation 10/10 cycle; applied (2) electrodes to each lower trapezius and rhomboids with patient seated with UE's supported on chair: Goal: muscle re education;  no adverse reactions noted  Manual therapy: .: goal: improve soft tissue elasticity, ROM STM performed to left shoulder anterior aspect, posterior aspectwith patient supine lyingfollowed by exercise and electrical stimulation  Therapeutic exercise;patient performed exercises with instruction, verbal and tactile cues of therapist; goal; independent  with home program, improve quickDash  Supine lying;  Rhythmic stabilization with leftUE at 90 degrees flexion and neutral rotations 10 reps 2 sets each IR with doubled red resistive band x 15  ER bilaterally with neutral forearm position single resistive band  x 15 Bilateral forward elevation with red resistive band stretched shoulder width apart x 10 reps Seated single arm row with doubled resistive red band x 15 reps  Patient response to treatment: Patient demonstrated improved motor control and technique with repeated demonstration and VC. Improved activation of periscapular muscles with electrical stimulation.          PT Education - 03/22/2017 1527    Education provided Yes   Education Details exercise instruction   Person(s) Educated Patient   Comprehension Verbalized understanding             PT Long Term Goals - Mar 22, 2017 1625      PT LONG TERM GOAL #1   Title Patient will improve function wiht left shoulder as indicated by QuickDash score of 20% or less, demonstratrating improved personal care and abiltiy to perform activties above shoulder level   Baseline quickDash 30%   Status On-going   Target Date 03/17/17     PT LONG TERM GOAL #2   Title  Patient will be independent with home program for pain control, exercises for flexibility and strength to allow transition to self management once discharged from physical therapy   Baseline limited knowledge of appropriate pain control, exercises and progression   Status On-going   Target Date 03/17/17               Plan - Mar 22, 2017 1528    Clinical Impression Statement Patient demonstrates improvement with decreasing pain and able to exercise with improved  motor control with treatment. She continues to require moderate cuing for performing exercises with good technique and proper alignment.    Rehab Potential Good   Clinical Impairments Affecting Rehab Potential (+) motivated, acute condition (-) carpal tunnel  symtpoms bilaterally, diabetes   PT Frequency 2x / week   PT Duration 6 weeks   PT Treatment/Interventions Manual techniques;Electrical Stimulation;Moist Heat;Patient/family education;Cryotherapy;Therapeutic exercise;Dry needling;Ultrasound   PT Next Visit Plan pain control, therapeutic exercise; electrical stimulation for muscle re education   PT Home Exercise Plan posture awareness, scapular control exercises, ROM      Patient will benefit from skilled therapeutic intervention in order to improve the following deficits and impairments:  Pain, Impaired flexibility, Difficulty walking, Increased muscle spasms, Decreased strength, Impaired perceived functional ability, Decreased activity tolerance, Decreased range of motion  Visit Diagnosis: Left shoulder pain, unspecified chronicity  Muscle weakness (generalized)  Other muscle spasm       G-Codes - 03/22/2017 1530    Functional Assessment Tool Used (Outpatient Only) QuickDash, pain, strength, ROM clinical judgment   Functional Limitation Carrying, moving and handling objects   Carrying, Moving and Handling Objects Current Status (  Z6109) At least 20 percent but less than 40 percent impaired, limited or restricted   Carrying, Moving and Handling Objects Goal Status (U0454) At least 1 percent but less than 20 percent impaired, limited or restricted      Problem List Patient Active Problem List   Diagnosis Date Noted  . S/P lumbar spinal fusion 12/13/2015  . Controlled type 2 diabetes mellitus without complication (HCC) 05/16/2015  . Thrombocytopenia (HCC) 05/16/2015  . Recurrent major depressive disorder, in full remission (HCC) 05/16/2015  . Essential (primary) hypertension 05/16/2015  . Fatty infiltration of liver 03/15/2015  . Aortic valve stenosis, nonrheumatic 01/18/2015  . Sciatica of right side 01/09/2015  . Type 2 diabetes mellitus (HCC) 11/10/2014  . RLS (restless legs syndrome) 08/23/2014  . Neuritis or radiculitis due  to rupture of lumbar intervertebral disc 06/13/2014  . Degeneration of intervertebral disc of lumbar region 06/13/2014  . Arthritis 01/23/2014  . Arthritis of knee, degenerative 11/15/2013  . Depression 09/29/2013  . Insomnia 09/29/2013  . Apnea, sleep 08/29/2013  . Calculus of kidney 08/29/2013  . Abnormal presence of protein in urine 08/29/2013  . Headache, migraine 08/29/2013  . BP (high blood pressure) 08/29/2013  . HLD (hyperlipidemia) 08/29/2013  . Allergic rhinitis 08/29/2013  . Intractable migraine without aura 02/18/2013    Beacher May PT 03/05/2017, 4:28 PM  [facility] PH[facility] AND SPORTS MEDICINE [address], [place], [address] Phone: [phone]   Fax:  [phone]  Name: Jessica Cole MRN: [id number] Date of Birth: 07-18-1950

## 2017-03-10 ENCOUNTER — Ambulatory Visit: Payer: Medicare HMO | Admitting: Physical Therapy

## 2017-03-11 ENCOUNTER — Ambulatory Visit
Admission: RE | Admit: 2017-03-11 | Discharge: 2017-03-11 | Disposition: A | Discharge status: Home / Self Care | Payer: Medicare HMO | Source: Ambulatory Visit | Attending: Internal Medicine | Admitting: Internal Medicine

## 2017-03-11 DIAGNOSIS — Z1231 Encounter for screening mammogram for malignant neoplasm of breast: Secondary | ICD-10-CM

## 2017-03-11 HISTORY — PX: BREAST BIOPSY: SHX20

## 2017-03-12 ENCOUNTER — Ambulatory Visit: Payer: Medicare HMO | Admitting: Physical Therapy

## 2017-03-12 ENCOUNTER — Encounter: Payer: Self-pay | Admitting: Physical Therapy

## 2017-03-12 DIAGNOSIS — M62838 Other muscle spasm: Secondary | ICD-10-CM

## 2017-03-12 DIAGNOSIS — M25512 Pain in left shoulder: Secondary | ICD-10-CM

## 2017-03-12 DIAGNOSIS — M6281 Muscle weakness (generalized): Secondary | ICD-10-CM

## 2017-03-12 NOTE — Therapy (Signed)
[facility] [address] [place], [place], [address] Phone: [phone]   Fax:  [phone]  Physical Therapy Treatment  Patient Details  Name: Jessica Cole MRN: [id number] Date of Birth: [birth year] Referring Provider: Jena GaussHooten, James P Jr., MD   Encounter Date: 03/12/2017  PT End of Session - 03/12/17 1630    Visit Number  11    Number of Visits  12    Date for PT Re-Evaluation  03/17/17    Authorization Type  11    Authorization Time Period  20 G code    PT Start Time  1505    PT Stop Time  1546    PT Time Calculation (min)  41 min    Activity Tolerance  Patient tolerated treatment well;Patient limited by pain    Behavior During Therapy  [facility] for tasks assessed/performed       Past Medical History:  Diagnosis Date  . Anxiety   . Colon polyps   . Degenerative arthritis   . Depression   . Diabetes mellitus without complication (HCC)    Type II  . Environmental allergies   . Family history of adverse reaction to anesthesia    sister- PONV  . Fatty liver   . Glaucoma   . Headache(784.0)    HX  MIGRAINES  . History of bronchitis   . History of kidney stones   . History of pneumonia   . Hypertension   . Hypothyroidism   . Obesity   . OSA on CPAP   . Plantar fasciitis    right  . PONV (postoperative nausea and vomiting)   . Renal calculi   . Renal calculi   . RLS (restless legs syndrome) 08/23/2014    Past Surgical History:  Procedure Laterality Date  . ABDOMINAL HYSTERECTOMY     partial  . APPENDECTOMY    . Arthroscopic surgery, knee Left   . BREAST BIOPSY Right    several  . BREAST BIOPSY Right 03/11/2017   u/s bx neg  . BUNIONECTOMY     LEFT   . COLONOSCOPY W/ POLYPECTOMY    . FOOT SURGERY     RIGHT    . KNEE ARTHROSCOPY W/ MENISCAL REPAIR Left   . LASIK    . SHOULDER SURGERY     RIGHT   . TONSILLECTOMY      There were no vitals filed for this visit.  Subjective Assessment -  03/12/17 1509    Subjective  Patient reports she is sore in her lower back and shoulder today. She has been moving things in her building at home.    Pertinent History  insidious onset of pain over the past couple of months that is limiting her movement and personal care with raising her arm above shoulder level    Limitations  Lifting;House hold activities;Other (comment) raising arm above shoulder    Patient Stated Goals  to be able to use left arm for daily tasks without stiffness or catching feeling    Currently in Pain?  Other (Comment) sorenes left shoulder          Objective: Posture: increased forward head with rounded shoulders Palpation; + spasms in upper trapezius, cervical spine paraspinal muscles   Treatment:  Modalities: Electrical stimulation:15 min.[place] Stimulation 10/10 cycle; applied (2) electrodes to each lower trapezius and rhomboids with patient seated with UE's supported on chair: Goal: muscle re education;  no adverse reactions noted  Manual therapy: 8min.:  goal: improve soft tissue elasticity, ROM STM performed to left shoulder anterior aspect, posterior aspectwith patient supine and right side lying  Therapeutic exercise;patient performed exercises with instruction, verbal and tactile cues of therapist; goal; independent with home program, improve quickDash  Supine lying;  Rhythmic stabilization with leftUE at 90 degrees flexion and neutral rotations 10 reps 2 sets each Side lying right : performed with left UE/shoulder  Elevation/depression x 10 Protraction and retraction x 10 Upper trapezius stretch 3 x 10 seconds  Patient response to treatment: Improved motor control and strength with repetition and tactile and moderate VC.  Improved activation of periscapular muscles with electrical stimulation.        PT Education - 03/12/17 1600    Education provided  Yes    Education Details  exercise instruction    Person(s) Educated  Patient     Methods  Explanation;Demonstration;Verbal cues    Comprehension  Verbalized understanding;Returned demonstration;Verbal cues required          PT Long Term Goals - 03/05/17 1625      PT LONG TERM GOAL #1   Title  Patient will improve function wiht left shoulder as indicated by QuickDash score of 20% or less, demonstratrating improved personal care and abiltiy to perform activties above shoulder level    Baseline  quickDash 30%    Status  On-going    Target Date  03/17/17      PT LONG TERM GOAL #2   Title   Patient will be independent with home program for pain control, exercises for flexibility and strength to allow transition to self management once discharged from physical therapy    Baseline  limited knowledge of appropriate pain control, exercises and progression    Status  On-going    Target Date  03/17/17            Plan - 03/12/17 1547    Clinical Impression Statement  Patient demonstrates steady progress with current treatment with improving strength and ability ot perform exercises with less assistance and cuing. She continues with intermittent exacerbation of pain in left shoulder with increased activity.     Rehab Potential  Good    Clinical Impairments Affecting Rehab Potential  (+) motivated, acute condition (-) carpal tunnel symtpoms bilaterally, diabetes    PT Frequency  2x / week    PT Duration  6 weeks    PT Treatment/Interventions  Manual techniques;Electrical Stimulation;Moist Heat;Patient/family education;Cryotherapy;Therapeutic exercise;Dry needling;Ultrasound    PT Next Visit Plan  pain control, therapeutic exercise; electrical stimulation for muscle re education    PT Home Exercise Plan  posture awareness, scapular control exercises, ROM       Patient will benefit from skilled therapeutic intervention in order to improve the following deficits and impairments:  Pain, Impaired flexibility, Difficulty walking, Increased muscle spasms, Decreased  strength, Impaired perceived functional ability, Decreased activity tolerance, Decreased range of motion  Visit Diagnosis: Left shoulder pain, unspecified chronicity  Muscle weakness (generalized)  Other muscle spasm     Problem List Patient Active Problem List   Diagnosis Date Noted  . S/P lumbar spinal fusion 12/13/2015  . Controlled type 2 diabetes mellitus without complication (HCC) 05/16/2015  . Thrombocytopenia (HCC) 05/16/2015  . Recurrent major depressive disorder, in full remission (HCC) 05/16/2015  . Essential (primary) hypertension 05/16/2015  . Fatty infiltration of liver 03/15/2015  . Aortic valve stenosis, nonrheumatic 01/18/2015  . Sciatica of right side 01/09/2015  . Type 2 diabetes mellitus (HCC) 11/10/2014  .  RLS (restless legs syndrome) 08/23/2014  . Neuritis or radiculitis due to rupture of lumbar intervertebral disc 06/13/2014  . Degeneration of intervertebral disc of lumbar region 06/13/2014  . Arthritis 01/23/2014  . Arthritis of knee, degenerative 11/15/2013  . Depression 09/29/2013  . Insomnia 09/29/2013  . Apnea, sleep 08/29/2013  . Calculus of kidney 08/29/2013  . Abnormal presence of protein in urine 08/29/2013  . Headache, migraine 08/29/2013  . BP (high blood pressure) 08/29/2013  . HLD (hyperlipidemia) 08/29/2013  . Allergic rhinitis 08/29/2013  . Intractable migraine without aura 02/18/2013    Beacher MayBrooks, Oluwatobi Ruppe PT 03/13/2017, 11:46 AM  [place] [facility] [address] [place], [place], [address] Phone: [phone]   Fax:  [phone]  Name: Jessica Cole MRN: [id number] Date of Birth: 03-May-1951

## 2017-03-17 ENCOUNTER — Encounter: Payer: Self-pay | Admitting: Physical Therapy

## 2017-03-17 ENCOUNTER — Other Ambulatory Visit: Payer: Self-pay

## 2017-03-17 ENCOUNTER — Ambulatory Visit: Payer: Medicare HMO | Admitting: Physical Therapy

## 2017-03-17 DIAGNOSIS — M25512 Pain in left shoulder: Secondary | ICD-10-CM

## 2017-03-17 DIAGNOSIS — M62838 Other muscle spasm: Secondary | ICD-10-CM

## 2017-03-17 DIAGNOSIS — M25612 Stiffness of left shoulder, not elsewhere classified: Secondary | ICD-10-CM

## 2017-03-17 DIAGNOSIS — M6281 Muscle weakness (generalized): Secondary | ICD-10-CM

## 2017-03-18 NOTE — Therapy (Signed)
[place] [facility] [address] , [place], [address] Phone: [phone]   Fax:  [phone]  Physical Therapy Treatment/Discharge Summary  Patient Details  Name: Jessica Cole MRN: [id number] Date of Birth: Dec 26, 1950 Referring Provider: Jena GaussHooten, James P Jr., MD   Encounter Date: 03/17/2017   Patient began physical therapy on 02/03/2017 and has attended 12 sessions through 03/17/2017. She has achieved all goals and is independent in home program for continued self management of pain/symptoms and exercises as instructed. Plan discharge from physical therapy at this time.    PT End of Session - 03/17/17 1309    Visit Number  12    Number of Visits  12    Date for PT Re-Evaluation  03/17/17    Authorization Type  12    Authorization Time Period  20 G Cole    PT Start Time  1304    PT Stop Time  1400    PT Time Calculation (min)  56 min    Activity Tolerance  Patient tolerated treatment well;Patient limited by pain    Behavior During Therapy  WFL for tasks assessed/performed       Past Medical History:  Diagnosis Date  . Anxiety   . Colon polyps   . Degenerative arthritis   . Depression   . Diabetes mellitus without complication (HCC)    Type II  . Environmental allergies   . Family history of adverse reaction to anesthesia    sister- PONV  . Fatty liver   . Glaucoma   . Headache(784.0)    HX  MIGRAINES  . History of bronchitis   . History of kidney stones   . History of pneumonia   . Hypertension   . Hypothyroidism   . Obesity   . OSA on CPAP   . Plantar fasciitis    right  . PONV (postoperative nausea and vomiting)   . Renal calculi   . Renal calculi   . RLS (restless legs syndrome) 08/23/2014    Past Surgical History:  Procedure Laterality Date  . ABDOMINAL HYSTERECTOMY     partial  . APPENDECTOMY    . Arthroscopic surgery, knee Left   . BREAST BIOPSY Right    several  . BREAST BIOPSY  Right 03/11/2017   u/s bx neg  . BUNIONECTOMY     LEFT   . COLONOSCOPY W/ POLYPECTOMY    . FOOT SURGERY     RIGHT    . KNEE ARTHROSCOPY W/ MENISCAL REPAIR Left   . LASIK    . SHOULDER SURGERY     RIGHT   . TONSILLECTOMY      There were no vitals filed for this visit.  Subjective Assessment - 03/17/17 1307    Subjective  Patient reports having a few stressful days with headaches and increased soreness all over. Her left shoulder is doing well and she is able to exercise independently and is compliant with home exercises. patient agrees to discharge from physical therapy at this time.    Pertinent History  insidious onset of pain over the past couple of months that is limiting her movement and personal care with raising her arm above shoulder level    Limitations  Lifting;House hold activities;Other (comment) raising arm above shoulder    Patient Stated Goals  to be able to use left arm for daily tasks without stiffness or catching feeling    Currently in Pain?  Other (Comment) "soreness all over"  Objective: Posture: forward head with rounded shoulders Palpation;+ spasms in upper trapezius, cervical spine paraspinal muscles AROM: left shoulder WNL all planes Strength: left UE at least 4+/5 all major muscle groups Outcome measure: QuickDash 18% (initially was 30%)   Treatment:  Modalities: Electrical stimulation:73min.[place] Stimulation 10/10 cycle; applied (2) electrodes to eachlower trapeziusand rhomboids with patient seated with UE's supportedon chair:Goal: muscle re education;no adverse reactions noted  Manual therapy:49min.: goal: improve soft tissue elasticity, ROM STM performed to left shoulder anterior aspect, posterior aspectwith patient supine and right side lying  Therapeutic exercise;patient performed exercises with instruction, verbal and tactile cues of therapist; goal; independent with home program, improve quickDash  Supine lying;   Rhythmic stabilization with leftUE at 90 degrees flexion and neutral rotations 10 reps 2 sets each Side lying right : performed with left UE/shoulder  Protraction and retraction x 10 Upper trapezius stretch 3 x 10 seconds Re assessed home program to include scapular stabilization, strength with resistive bands, ER and posture correction  Patient response to treatment:Improved motor control and strength with repetition and minimal VC.  Improved activation of periscapular muscles with electrical stimulation.      PT Education - 03/17/17 1308    Education provided  Yes    Education Details  exercise instruction for HEP to continue for strengthening and motor control     Person(s) Educated  Patient    Methods  Explanation;Demonstration;Verbal cues    Comprehension  Verbalized understanding;Verbal cues required;Returned demonstration          PT Long Term Goals - 03/17/17 1400      PT LONG TERM GOAL #1   Title  Patient will improve function wiht left shoulder as indicated by QuickDash score of 20% or less, demonstratrating improved personal care and abiltiy to perform activties above shoulder level    Baseline  quickDash 30%; 03/17/17 18%    Status  Achieved      PT LONG TERM GOAL #2   Title   Patient will be independent with home program for pain control, exercises for flexibility and strength to allow transition to self management once discharged from physical therapy    Baseline  limited knowledge of appropriate pain control, exercises and progression    Status  Achieved            Plan - 03/17/17 1310    Clinical Impression Statement  Patient demonstrates good understanding of exercises to continue at home and should continue to improve strength and functional use of left UE without exacerbation of symptoms. She has achieved all goals and is ready for discharge from physical therapy at this time.     Rehab Potential  Good    Clinical Impairments Affecting Rehab  Potential  (+) motivated, acute condition (-) carpal tunnel symtpoms bilaterally, diabetes    PT Frequency  2x / week    PT Duration  6 weeks    PT Treatment/Interventions  Manual techniques;Electrical Stimulation;Moist Heat;Patient/family education;Cryotherapy;Therapeutic exercise;Dry needling;Ultrasound    PT Next Visit Plan  dicaharge     PT Home Exercise Plan  posture awareness, scapular control exercises, ROM    Consulted and Agree with Plan of Care  Patient       Patient will benefit from skilled therapeutic intervention in order to improve the following deficits and impairments:  Pain, Impaired flexibility, Difficulty walking, Increased muscle spasms, Decreased strength, Impaired perceived functional ability, Decreased activity tolerance, Decreased range of motion  Visit Diagnosis: Left shoulder pain, unspecified chronicity  Muscle weakness (  generalized)  Other muscle spasm  Stiffness of left shoulder, not elsewhere classified   G-Codes - 03/17/17 1458    Functional Assessment Tool Used (Outpatient Only)  QuickDash, pain, strength, ROM clinical judgment    Functional Limitation  Carrying, moving and handling objects    Carrying, Moving and Handling Objects Goal Status ([id number])  At least 1 percent but less than 20 percent impaired, limited or restricted    Carrying, Moving and Handling Objects Discharge Status [phone]([id number])  At least 1 percent but less than 20 percent impaired, limited or restricted       Problem List Patient Active Problem List   Diagnosis Date Noted  . S/P lumbar spinal fusion 12/13/2015  . Controlled type 2 diabetes mellitus without complication (HCC) 05/16/2015  . Thrombocytopenia (HCC) 05/16/2015  . Recurrent major depressive disorder, in full remission (HCC) 05/16/2015  . Essential (primary) hypertension 05/16/2015  . Fatty infiltration of liver 03/15/2015  . Aortic valve stenosis, nonrheumatic 01/18/2015  . Sciatica of right side 01/09/2015  . Type 2  diabetes mellitus (HCC) 11/10/2014  . RLS (restless legs syndrome) 08/23/2014  . Neuritis or radiculitis due to rupture of lumbar intervertebral disc 06/13/2014  . Degeneration of intervertebral disc of lumbar region 06/13/2014  . Arthritis 01/23/2014  . Arthritis of knee, degenerative 11/15/2013  . Depression 09/29/2013  . Insomnia 09/29/2013  . Apnea, sleep 08/29/2013  . Calculus of kidney 08/29/2013  . Abnormal presence of protein in urine 08/29/2013  . Headache, migraine 08/29/2013  . BP (high blood pressure) 08/29/2013  . HLD (hyperlipidemia) 08/29/2013  . Allergic rhinitis 08/29/2013  . Intractable migraine without aura 02/18/2013    Beacher MayBrooks, Marie PT 03/18/2017, 5:00 PM  [place] [facility] [address] [place], [place], [address] Phone: [phone]   Fax:  [phone]  Name: Jessica Cole MRN: [id number] Date of Birth: 1950/07/27

## 2017-03-19 ENCOUNTER — Ambulatory Visit: Payer: Medicare HMO | Admitting: Physical Therapy

## 2017-03-23 ENCOUNTER — Ambulatory Visit: Payer: Medicare HMO | Admitting: Physical Therapy

## 2017-03-24 ENCOUNTER — Ambulatory Visit: Payer: Medicare HMO | Admitting: Adult Health

## 2017-03-24 ENCOUNTER — Encounter: Payer: Self-pay | Admitting: Adult Health

## 2017-03-24 VITALS — BP 147/83 | HR 70 | Ht 61.0 in | Wt 196.6 lb

## 2017-03-24 DIAGNOSIS — G2581 Restless legs syndrome: Secondary | ICD-10-CM | POA: Diagnosis not present

## 2017-03-24 DIAGNOSIS — G43019 Migraine without aura, intractable, without status migrainosus: Secondary | ICD-10-CM

## 2017-03-24 MED ORDER — ROPINIROLE HCL 4 MG PO TABS: 4.0000 mg | ORAL_TABLET | Freq: Two times a day (BID) | ORAL | 5 refills | Status: DC

## 2017-03-24 MED ORDER — TOPIRAMATE 100 MG PO TABS: 100.0000 mg | ORAL_TABLET | Freq: Every day | ORAL | 5 refills | Status: DC

## 2017-03-24 MED ORDER — GABAPENTIN 800 MG PO TABS: ORAL_TABLET | ORAL | 3 refills | Status: DC

## 2017-03-24 NOTE — Progress Notes (Signed)
I have read the note, and I agree with the clinical assessment and plan.  Charles K Willis   

## 2017-03-24 NOTE — Progress Notes (Signed)
PATIENT: Jessica Cole DOB: 08-16-1950  REASON FOR VISIT: follow up- HISTORY FROM: patient  HISTORY OF PRESENT ILLNESS: Jessica Cole is a 66 year old female with a history of restless leg syndrome and migraine headaches.  She returns today for follow-up.  She reports that if she takes her gabapentin and Requip this typically controls her restless leg symptoms.  She has noted that if she forgets a dose her discomfort returns.  She states that her headache frequency has also increased.  Occasionally she will have a more severe headache she remains on Topamax 75 mg at bedtime.  She states the location of her headache varies.  She does report photophobia, phonophobia.  Reports nausea but no vomiting.  She states that the hallucinations she was experiencing has improved.  She states on occasion she may see as shadow in her peripheral vision.  She has never followed with a psychiatrist she returns today for an evaluation.  REVIEW OF SYSTEMS: Out of a complete 14 system review of symptoms, the patient complains only of the following symptoms, and all other reviewed systems are negative.  Activity change, appetite change, fatigue, eye itching, eye redness, blurred vision, cough, wheezing, chest pain, constipation, diarrhea, restless leg, apnea, snoring, moles, itching, neck stiffness, aching muscles, joint swelling, joint pain, headache, bruise/bleed easily  ALLERGIES: Allergies  Allergen Reactions  . Diazepam     hallucinations  . Dexamethasone Other (See Comments)    During a tapered dose, once.  Was shaky (side effect, not allergy).    HOME MEDICATIONS: Outpatient Medications Prior to Visit  Medication Sig Dispense Refill  . azelastine (ASTELIN) 0.1 % nasal spray Place 1 spray into both nostrils 2 (two) times daily.     Marland Kitchen BREO ELLIPTA 200-25 MCG/INH AEPB Take 1 puff daily by mouth.    . busPIRone (BUSPAR) 7.5 MG tablet Take 7.5 mg 2 (two) times daily as needed by mouth (chest pain).    .  Coenzyme Q10 (CO Q 10) 100 MG CAPS Take 200 mg by mouth daily.     . diclofenac sodium (VOLTAREN) 1 % GEL Apply 4 g topically 2 (two) times daily. (Patient taking differently: Apply 4 g topically daily as needed (for pain). ) 100 g 1  . enalapril (VASOTEC) 10 MG tablet Take 10 mg by mouth daily.    Marland Kitchen estradiol (VIVELLE-DOT) 0.05 MG/24HR patch Place 1 patch onto the skin 2 (two) times a week.     . fexofenadine (ALLER-EASE) 180 MG tablet Take 180 mg by mouth daily.    . fluticasone (FLONASE) 50 MCG/ACT nasal spray Place 2 sprays into both nostrils 2 (two) times daily.     . furosemide (LASIX) 20 MG tablet Take 20 mg by mouth daily as needed.    . gabapentin (NEURONTIN) 800 MG tablet TAKE ONE (1) TABLET BY MOUTH TWO (2) TIMES DAILY 180 tablet 1  . HYDROcodone-homatropine (HYCODAN) 5-1.5 MG/5ML syrup Take 5 mLs every 6 (six) hours as needed by mouth for cough.    . latanoprost (XALATAN) 0.005 % ophthalmic solution Place 1 drop into both eyes at bedtime.     Marland Kitchen levothyroxine (SYNTHROID, LEVOTHROID) 112 MCG tablet Take 112 mcg by mouth daily before breakfast.     . losartan (COZAAR) 50 MG tablet Take 50 mg by mouth every evening.     . metFORMIN (GLUCOPHAGE) 500 MG tablet Take 500 mg by mouth 2 (two) times daily with a meal.     . metoprolol succinate (TOPROL-XL) 25 MG  24 hr tablet TAKE ONE TABLET BY MOUTH EVERY DAY 90 tablet 1  . montelukast (SINGULAIR) 10 MG tablet Take 10 mg at bedtime by mouth.    . Multiple Vitamins-Minerals (MULTIVITAMIN ADULT PO) Take by mouth daily.    . Omega-3 Fatty Acids (FISH OIL ULTRA) 1000 MG CAPS Take 1,000 mg by mouth 2 (two) times daily.     Marland Kitchen. oxybutynin (DITROPAN) 5 MG tablet Take 5 mg by mouth 2 (two) times daily.    . potassium chloride (K-DUR,KLOR-CON) 10 MEQ tablet Take 10 mEq by mouth daily as needed.    . predniSONE (DELTASONE) 5 MG tablet Begin taking 6 tablets daily, taper by one tablet daily until off the medication. 21 tablet 0  . Red Yeast Rice 600 MG CAPS  Take 1,200 mg by mouth 2 (two) times daily.    Marland Kitchen. rOPINIRole (REQUIP) 4 MG tablet TAKE ONE TABLET TWICE DAILY 60 tablet 5  . tiZANidine (ZANAFLEX) 4 MG tablet Take 4 mg by mouth 3 (three) times daily as needed for muscle spasms.    Marland Kitchen. topiramate (TOPAMAX) 25 MG tablet Take one tablet at night for one week, then take 2 tablets at night for one week, then take 3 tablets at night. 90 tablet 3  . traMADol (ULTRAM) 50 MG tablet Take 1 tablet (50 mg total) by mouth every 6 (six) hours as needed. Must last 28 days 50 tablet 1  . DULoxetine (CYMBALTA) 30 MG capsule Take 90 mg by mouth daily.      No facility-administered medications prior to visit.     PAST MEDICAL HISTORY: Past Medical History:  Diagnosis Date  . Anxiety   . Colon polyps   . Degenerative arthritis   . Depression   . Diabetes mellitus without complication (HCC)    Type II  . Environmental allergies   . Family history of adverse reaction to anesthesia    sister- PONV  . Fatty liver   . Glaucoma   . Headache(784.0)    HX  MIGRAINES  . History of bronchitis   . History of kidney stones   . History of pneumonia   . Hypertension   . Hypothyroidism   . Obesity   . OSA on CPAP   . Plantar fasciitis    right  . PONV (postoperative nausea and vomiting)   . Renal calculi   . Renal calculi   . RLS (restless legs syndrome) 08/23/2014    PAST SURGICAL HISTORY: Past Surgical History:  Procedure Laterality Date  . ABDOMINAL HYSTERECTOMY     partial  . APPENDECTOMY    . Arthroscopic surgery, knee Left   . BREAST BIOPSY Right    several  . BREAST BIOPSY Right 03/11/2017   u/Cole bx neg  . BUNIONECTOMY     LEFT   . COLONOSCOPY W/ POLYPECTOMY    . FOOT SURGERY     RIGHT    . KNEE ARTHROSCOPY W/ MENISCAL REPAIR Left   . LASIK    . LUMBAR FOUR-FIVE  MAXIMUM ACCESS (MAS) POSTERIOR LUMBAR INTERBODY FUSION (PLIF) with extension of instrumentation LUMBAR TWO-FIVE N/A 04/25/2016   [facility]  .  Lumbar two-three - Lumbar three-four MAXIMUM ACCESS (MAS) POSTERIOR LUMBAR INTERBODY FUSION (PLIF)   N/A 12/13/2015   Per[facility] by Jessica AlertJones, David S[facility]  . SHOULDER SURGERY     RIGHT   . TONSILLECTOMY      FAMILY HISTORY: Family History  Problem Relation Age of Onset  . Lung cancer Mother   . Congestive Heart Failure Father   . Depression Sister   . Rheum arthritis Sister   . Headache Maternal Grandfather   . Migraines Maternal Grandfather   . Headache Maternal Uncle   . Diabetes Unknown   . Heart disease Unknown   . Hypertension Unknown   . Breast cancer Neg Hx     SOCIAL HISTORY: Social History   Socioeconomic History  . Marital status: Married    Spouse name: Milinda CaveDickey   . Number of children: 0  . Years of education: 1712  . Highest education level: Not on file  Social Needs  . Financial resource strain: Not on file  . Food insecurity - worry: Not on file  . Food insecurity - inability: Not on file  . Transportation needs - medical: Not on file  . Transportation needs - non-medical: Not on file  Occupational History  . Occupation: retired  Tobacco Use  . Smoking status: Never Smoker  . Smokeless tobacco: Never Used  Substance and Sexual Activity  . Alcohol use: No  . Drug use: No  . Sexual activity: Not on file  Other Topics Concern  . Not on file  Social History Narrative   Patient is married and lives with her husband.   Patient has a high school education.   Patient has no children.    Patient works for the [employer]   Patient is right handed.   Patient drinks occasionally drinks caffeine.      PHYSICAL EXAM  Vitals:   03/24/17 0741  BP: (!) 147/83  Pulse: 70  Weight: 196 lb 9.6 oz (89.2 kg)  Height: 5\' 1"  (1.549 m)   Body mass index is 37.15 kg/m.  Generalized: Well developed, in no acute distress   Neurological examination  Mentation: Alert oriented to time, place, history taking. Follows all commands speech and  language fluent Cranial nerve II-XII: Pupils were equal round reactive to light. Extraocular movements were full, visual field were full on confrontational test. Facial sensation and strength were normal. Uvula tongue midline. Head turning and shoulder shrug  were normal and symmetric. Motor: The motor testing reveals 5 over 5 strength of all 4 extremities. Good symmetric motor tone is noted throughout.  Sensory: Sensory testing is intact to soft touch on all 4 extremities. No evidence of extinction is noted.  Coordination: Cerebellar testing reveals good finger-nose-finger and heel-to-shin bilaterally.  Gait and station: Gait is normal.  Reflexes: Deep tendon reflexes are symmetric and normal bilaterally.   DIAGNOSTIC DATA (LABS, IMAGING, TESTING) - I reviewed patient records, labs, notes, testing and imaging myself where available.  Lab Results  Component Value Date   WBC 4.6 08/18/2016   HGB 14.2 08/18/2016   HCT 42.4 08/18/2016   MCV 89.4 08/18/2016   PLT 151 08/18/2016      Component Value Date/Time   NA 139 08/18/2016 1223   NA 138 06/08/2013 1558   K 3.9 08/18/2016 1223   K 4.1 06/08/2013 1558   CL 105 08/18/2016 1223   CL 106 06/08/2013 1558   CO2 26 08/18/2016 1223   CO2 29 06/08/2013 1558   GLUCOSE 149 (H) 08/18/2016 1223   GLUCOSE 93 06/08/2013 1558   BUN 16 08/18/2016 1223   BUN 11 06/08/2013 1558   CREATININE 0.58 08/18/2016 1223   CREATININE 0.64 06/08/2013 1558   CALCIUM 9.7 08/18/2016 1223   CALCIUM 9.6 06/08/2013 1558  PROT 6.5 04/18/2016 1008   PROT 7.5 08/01/2011 1307   ALBUMIN 3.9 04/18/2016 1008   ALBUMIN 4.3 08/01/2011 1307   AST 26 04/18/2016 1008   AST 47 (H) 08/01/2011 1307   ALT 29 04/18/2016 1008   ALT 69 08/01/2011 1307   ALKPHOS 56 04/18/2016 1008   ALKPHOS 69 08/01/2011 1307   BILITOT 0.6 04/18/2016 1008   BILITOT 0.4 08/01/2011 1307   GFRNONAA >60 08/18/2016 1223   GFRNONAA >60 06/08/2013 1558   GFRAA >60 08/18/2016 1223   GFRAA >60  06/08/2013 1558    Lab Results  Component Value Date   HGBA1C 6.6 (H) 04/18/2016      ASSESSMENT AND PLAN 66 y.o. year old female  has a past medical history of Anxiety, Colon polyps, Degenerative arthritis, Depression, Diabetes mellitus without complication (HCC), Environmental allergies, Family history of adverse reaction to anesthesia, Fatty liver, Glaucoma, Headache(784.0), History of bronchitis, History of kidney stones, History of pneumonia, Hypertension, Hypothyroidism, Obesity, OSA on CPAP, Plantar fasciitis, PONV (postoperative nausea and vomiting), Renal calculi, Renal calculi, and RLS (restless legs syndrome) (08/23/2014). here with:  1.  Restless leg syndrome 2.  Migraine headache  The patient will continue on gabapentin and Requip for restless leg.  The patient'Cole headaches have increased in frequency.  I will increase Topamax to 100 mg at bedtime.  If this is not beneficial she should let us know.  She will follow-up in 6 months or sooner if needed   Butch Penny, MSN, NP-C 03/24/2017, 8:04 AM [facility] [address] [place], [place] [address] [phone]

## 2017-03-24 NOTE — Patient Instructions (Signed)
Your Plan:  Continue gabapentin and requip Increase Topamax to 100 mg at bedtime for headaches If your symptoms worsen or you develop new symptoms please let us know.       Thank you for coming to see us at [facility]. I hope we have been able to provide you high quality care today.  You may receive a patient satisfaction survey over the next few weeks. We would appreciate your feedback and comments so that we may continue to improve ourselves and the health of our patients.

## 2017-03-25 ENCOUNTER — Encounter: Payer: Medicare HMO | Admitting: Physical Therapy

## 2017-03-31 ENCOUNTER — Encounter: Payer: Medicare HMO | Admitting: Physical Therapy

## 2017-04-02 ENCOUNTER — Encounter: Payer: Medicare HMO | Admitting: Physical Therapy

## 2017-05-01 ENCOUNTER — Encounter: Payer: Self-pay | Admitting: Urology

## 2017-05-01 ENCOUNTER — Ambulatory Visit
Admission: RE | Admit: 2017-05-01 | Discharge: 2017-05-01 | Disposition: A | Discharge status: Home / Self Care | Payer: Medicare HMO | Source: Ambulatory Visit | Attending: Urology | Admitting: Urology

## 2017-05-01 ENCOUNTER — Ambulatory Visit: Payer: Medicare HMO | Admitting: Urology

## 2017-05-01 VITALS — BP 152/84 | HR 73 | Ht 61.0 in | Wt 198.3 lb

## 2017-05-01 DIAGNOSIS — R109 Unspecified abdominal pain: Secondary | ICD-10-CM

## 2017-05-01 DIAGNOSIS — Z09 Encounter for follow-up examination after completed treatment for conditions other than malignant neoplasm: Secondary | ICD-10-CM | POA: Diagnosis not present

## 2017-05-01 DIAGNOSIS — N200 Calculus of kidney: Secondary | ICD-10-CM

## 2017-05-01 DIAGNOSIS — Z87442 Personal history of urinary calculi: Secondary | ICD-10-CM | POA: Insufficient documentation

## 2017-05-01 LAB — URINALYSIS, COMPLETE
Bilirubin, UA: NEGATIVE
Glucose, UA: NEGATIVE
Ketones, UA: NEGATIVE
NITRITE UA: NEGATIVE
PH UA: 6.5 (ref 5.0–7.5)
Protein, UA: NEGATIVE
Specific Gravity, UA: 1.02 (ref 1.005–1.030)
UUROB: 0.2 mg/dL (ref 0.2–1.0)

## 2017-05-01 LAB — MICROSCOPIC EXAMINATION

## 2017-05-01 NOTE — Progress Notes (Signed)
05/01/2017 11:57 AM   Jessica Cole 05-07-1950 [id number]  Referring provider: Lynnea Ferrier, MD [address] [facility] [place], [place] [address]  Chief Complaint  Patient presents with  . Nephrolithiasis    HPI: The patient is a 66 year old female with a past medical history of nephrolithiasis presents today with chief complaint of a kidney stone.  The patient states for approximately 1 week she has had bilateral flank pain that feels like previous obstructive stones.  She does endorse that it is in fact in both sides and does often switch from one side to the other.  She also has nausea and vomiting.  She states this is how she typically feels when she has nephrolithiasis.  She has had over 20 stone episodes in her lifetime.  She did have a metabolic workup many years ago which per patient she thinks was negative.  She has required lithotripsy in the past.  She has been seen by Dr. Evelene Croon in the past for this.  Her last stone composition report from December 2017 revealed 95% calcium oxalate and 5% calcium phosphate.    Her last CT was in December 2017 which showed a 3 mm stone in the right UVJ and punctate nonobstructing lower pole stones on the left side. She did pass the 3 mm stone.   PMH: Past Medical History:  Diagnosis Date  . Anxiety   . Colon polyps   . Degenerative arthritis   . Depression   . Diabetes mellitus without complication (HCC)    Type II  . Environmental allergies   . Family history of adverse reaction to anesthesia    sister- PONV  . Fatty liver   . Glaucoma   . Headache(784.0)    HX  MIGRAINES  . History of bronchitis   . History of kidney stones   . History of pneumonia   . Hypertension   . Hypothyroidism   . Obesity   . OSA on CPAP   . Plantar fasciitis    right  . PONV (postoperative nausea and vomiting)   . Renal calculi   . Renal calculi   . RLS (restless legs syndrome) 08/23/2014    Surgical History: Past  Surgical History:  Procedure Laterality Date  . ABDOMINAL HYSTERECTOMY     partial  . APPENDECTOMY    . Arthroscopic surgery, knee Left   . BREAST BIOPSY Right    several  . BREAST BIOPSY Right 03/11/2017   u/s bx neg  . BUNIONECTOMY     LEFT   . COLONOSCOPY W/ POLYPECTOMY    . FOOT SURGERY     RIGHT    . KNEE ARTHROSCOPY W/ MENISCAL REPAIR Left   . LASIK    . MAXIMUM ACCESS (MAS)POSTERIOR LUMBAR INTERBODY FUSION (PLIF) 1 LEVEL N/A 04/25/2016   Procedure: LUMBAR FOUR-FIVE  MAXIMUM ACCESS (MAS) POSTERIOR LUMBAR INTERBODY FUSION (PLIF) with extension of instrumentation LUMBAR TWO-FIVE;  Surgeon: Tia Alert, MD;  Location: [facility] OR;  Service: Neurosurgery;  Laterality: N/A;  . MAXIMUM ACCESS (MAS)POSTERIOR LUMBAR INTERBODY FUSION (PLIF) 2 LEVEL N/A 12/13/2015   Procedure: Lumbar two-three - Lumbar three-four MAXIMUM ACCESS (MAS) POSTERIOR LUMBAR INTERBODY FUSION (PLIF)  ;  Surgeon: Tia Alert, MD;  Location: [facility] NEURO ORS;  Service: Neurosurgery;  Laterality: N/A;  . SHOULDER SURGERY     RIGHT   . TONSILLECTOMY      Home Medications:  Allergies as of 05/01/2017      Reactions  Diazepam    hallucinations   Dexamethasone Other (See Comments)   During a tapered dose, once.  Was shaky (side effect, not allergy).      Medication List        Accurate as of 05/01/17 11:57 AM. Always use your most recent med list.          ALLER-EASE 180 MG tablet Generic drug:  fexofenadine Take 180 mg by mouth daily.   azelastine 0.1 % nasal spray Commonly known as:  ASTELIN Place 1 spray into both nostrils 2 (two) times daily.   BREO ELLIPTA 200-25 MCG/INH Aepb Generic drug:  fluticasone furoate-vilanterol Take 1 puff daily by mouth.   busPIRone 7.5 MG tablet Commonly known as:  BUSPAR Take 7.5 mg 2 (two) times daily as needed by mouth (chest pain).   Co Q 10 100 MG Caps Take 200 mg by mouth daily.   diclofenac sodium 1 % Gel Commonly known as:  VOLTAREN Apply 4 g topically 2  (two) times daily.   enalapril 10 MG tablet Commonly known as:  VASOTEC Take 10 mg by mouth daily.   estradiol 0.05 MG/24HR patch Commonly known as:  VIVELLE-DOT Place 1 patch onto the skin 2 (two) times a week.   FISH OIL ULTRA 1000 MG Caps Take 1,000 mg by mouth 2 (two) times daily.   fluticasone 50 MCG/ACT nasal spray Commonly known as:  FLONASE Place 2 sprays into both nostrils 2 (two) times daily.   furosemide 20 MG tablet Commonly known as:  LASIX Take 20 mg by mouth daily as needed.   gabapentin 800 MG tablet Commonly known as:  NEURONTIN TAKE ONE (1) TABLET BY MOUTH TWO (2) TIMES DAILY   HYDROcodone-homatropine 5-1.5 MG/5ML syrup Commonly known as:  HYCODAN Take 5 mLs every 6 (six) hours as needed by mouth for cough.   latanoprost 0.005 % ophthalmic solution Commonly known as:  XALATAN Place 1 drop into both eyes at bedtime.   levothyroxine 112 MCG tablet Commonly known as:  SYNTHROID, LEVOTHROID Take 112 mcg by mouth daily before breakfast.   losartan 50 MG tablet Commonly known as:  COZAAR Take 50 mg by mouth every evening.   metFORMIN 500 MG tablet Commonly known as:  GLUCOPHAGE Take 500 mg by mouth 2 (two) times daily with a meal.   metoprolol succinate 25 MG 24 hr tablet Commonly known as:  TOPROL-XL TAKE ONE TABLET BY MOUTH EVERY DAY   montelukast 10 MG tablet Commonly known as:  SINGULAIR Take 10 mg at bedtime by mouth.   MULTIVITAMIN ADULT PO Take by mouth daily.   oxybutynin 5 MG tablet Commonly known as:  DITROPAN Take 5 mg by mouth 2 (two) times daily.   potassium chloride 10 MEQ tablet Commonly known as:  K-DUR,KLOR-CON Take 10 mEq by mouth daily as needed.   Red Yeast Rice 600 MG Caps Take 1,200 mg by mouth 2 (two) times daily.   rOPINIRole 4 MG tablet Commonly known as:  REQUIP Take 1 tablet (4 mg total) 2 (two) times daily by mouth.   tiZANidine 4 MG tablet Commonly known as:  ZANAFLEX Take 4 mg by mouth 3 (three) times  daily as needed for muscle spasms.   topiramate 100 MG tablet Commonly known as:  TOPAMAX Take 1 tablet (100 mg total) at bedtime by mouth.   traMADol 50 MG tablet Commonly known as:  ULTRAM Take 1 tablet (50 mg total) by mouth every 6 (six) hours as needed. Must last 28  days       Allergies:  Allergies  Allergen Reactions  . Diazepam     hallucinations  . Dexamethasone Other (See Comments)    During a tapered dose, once.  Was shaky (side effect, not allergy).    Family History: Family History  Problem Relation Age of Onset  . Lung cancer Mother   . Congestive Heart Failure Father   . Depression Sister   . Rheum arthritis Sister   . Headache Maternal Grandfather   . Migraines Maternal Grandfather   . Headache Maternal Uncle   . Diabetes Unknown   . Heart disease Unknown   . Hypertension Unknown   . Breast cancer Neg Hx   . Bladder Cancer Neg Hx   . Kidney cancer Neg Hx     Social History:  reports that  has never smoked. she has never used smokeless tobacco. She reports that she does not drink alcohol or use drugs.  ROS: UROLOGY Frequent Urination?: Yes Hard to postpone urination?: No Burning/pain with urination?: No Get up at night to urinate?: Yes Leakage of urine?: Yes Urine stream starts and stops?: No Trouble starting stream?: No Do you have to strain to urinate?: No Blood in urine?: Yes Urinary tract infection?: No Sexually transmitted disease?: No Injury to kidneys or bladder?: No Painful intercourse?: No Weak stream?: No Currently pregnant?: No Vaginal bleeding?: No Last menstrual period?: n  Gastrointestinal Nausea?: Yes Vomiting?: No Indigestion/heartburn?: No Diarrhea?: No Constipation?: Yes  Constitutional Fever: No Night sweats?: No Weight loss?: No Fatigue?: Yes  Skin Skin rash/lesions?: No Itching?: No  Eyes Blurred vision?: No Double vision?: No  Ears/Nose/Throat Sore throat?: No Sinus problems?:  No  Hematologic/Lymphatic Swollen glands?: No Easy bruising?: Yes  Cardiovascular Leg swelling?: No Chest pain?: No  Respiratory Cough?: Yes Shortness of breath?: No  Endocrine Excessive thirst?: No  Musculoskeletal Back pain?: Yes Joint pain?: Yes  Neurological Headaches?: Yes Dizziness?: No  Psychologic Depression?: No Anxiety?: No  Physical Exam: BP (!) 152/84 (BP Location: Right Arm, Patient Position: Sitting, Cuff Size: Large)   Pulse 73   Ht 5\' 1"  (1.549 m)   Wt 198 lb 4.8 oz (89.9 kg)   BMI 37.47 kg/m   Constitutional:  Alert and oriented, No acute distress. HEENT: [place] AT, moist mucus membranes.  Trachea midline, no masses. Cardiovascular: No clubbing, cyanosis, or edema. Respiratory: Normal respiratory effort, no increased work of breathing. GI: Abdomen is soft, nontender, nondistended, no abdominal masses GU: Mild bilateral CVA tenderness.  Skin: No rashes, bruises or suspicious lesions. Lymph: No cervical or inguinal adenopathy. Neurologic: Grossly intact, no focal deficits, moving all 4 extremities. Psychiatric: Normal mood and affect.  Laboratory Data: Lab Results  Component Value Date   WBC 4.6 08/18/2016   HGB 14.2 08/18/2016   HCT 42.4 08/18/2016   MCV 89.4 08/18/2016   PLT 151 08/18/2016    Lab Results  Component Value Date   CREATININE 0.58 08/18/2016    No results found for: PSA  No results found for: TESTOSTERONE  Lab Results  Component Value Date   HGBA1C 6.6 (H) 04/18/2016    Urinalysis    Component Value Date/Time   APPEARANCEUR Clear 05/01/2017 1038   GLUCOSEU Negative 05/01/2017 1038   BILIRUBINUR Negative 05/01/2017 1038   PROTEINUR Negative 05/01/2017 1038   NITRITE Negative 05/01/2017 1038   LEUKOCYTESUR 1+ (A) 05/01/2017 1038    Pertinent Imaging: KUB reviewed from today.  No convincing ureteral or renal calculi.  This is complicated  however by phleboliths which were also visible on a previous scout image  from 1 year ago.  Significant constipation noted.  Assessment & Plan:   1. Bilateral flank pain Due to the fact the patient has been bilateral flank pain that alternates sides as well as the fact she is still making urine makes me less suspicious that nephrolithiasis is the source of her discomfort.  Her KUB does not show an obvious stone today.  Will obtain a CT stone protocol to ensure no radial lucent stones are present.  Patient will follow-up for results.  2. Recurrent nephrolithiasis -Patient may benefit from repeat 24-hour urine study due to the significant frequency of her stone formation.  3.  Constipation I advised the patient to start a stool softening regimen with medications such as MiraLAX due to her significant constipation.  This may be also part of an explanation for her symptoms.  Return for after CT.  Hildred LaserBrian James Kobey Sides, MD  [facility] [address], [address] [place], [place] [id number] [phone]

## 2017-05-08 ENCOUNTER — Telehealth: Payer: Self-pay

## 2017-05-08 NOTE — Telephone Encounter (Signed)
Pt called requesting pain medication for kidney stone. Please advise.

## 2017-05-09 ENCOUNTER — Emergency Department: Payer: Medicare HMO

## 2017-05-09 ENCOUNTER — Emergency Department
Admission: EM | Admit: 2017-05-09 | Discharge: 2017-05-09 | Disposition: A | Discharge status: Home / Self Care | Payer: Medicare HMO | Attending: Emergency Medicine | Admitting: Emergency Medicine

## 2017-05-09 ENCOUNTER — Encounter: Payer: Self-pay | Admitting: Emergency Medicine

## 2017-05-09 DIAGNOSIS — N201 Calculus of ureter: Secondary | ICD-10-CM | POA: Insufficient documentation

## 2017-05-09 DIAGNOSIS — Z79899 Other long term (current) drug therapy: Secondary | ICD-10-CM | POA: Diagnosis not present

## 2017-05-09 DIAGNOSIS — E119 Type 2 diabetes mellitus without complications: Secondary | ICD-10-CM | POA: Insufficient documentation

## 2017-05-09 DIAGNOSIS — Z7984 Long term (current) use of oral hypoglycemic drugs: Secondary | ICD-10-CM | POA: Insufficient documentation

## 2017-05-09 DIAGNOSIS — E039 Hypothyroidism, unspecified: Secondary | ICD-10-CM | POA: Diagnosis not present

## 2017-05-09 DIAGNOSIS — R109 Unspecified abdominal pain: Secondary | ICD-10-CM | POA: Diagnosis present

## 2017-05-09 DIAGNOSIS — I10 Essential (primary) hypertension: Secondary | ICD-10-CM | POA: Diagnosis not present

## 2017-05-09 LAB — COMPREHENSIVE METABOLIC PANEL
ALBUMIN: 4.4 g/dL (ref 3.5–5.0)
ALK PHOS: 55 U/L (ref 38–126)
ALT: 35 U/L (ref 14–54)
AST: 31 U/L (ref 15–41)
Anion gap: 6 (ref 5–15)
BILIRUBIN TOTAL: 0.6 mg/dL (ref 0.3–1.2)
BUN: 15 mg/dL (ref 6–20)
CALCIUM: 9.5 mg/dL (ref 8.9–10.3)
CO2: 23 mmol/L (ref 22–32)
Chloride: 108 mmol/L (ref 101–111)
Creatinine, Ser: 1.14 mg/dL — ABNORMAL HIGH (ref 0.44–1.00)
GFR calc Af Amer: 56 mL/min — ABNORMAL LOW (ref >60)
GFR calc non Af Amer: 48 mL/min — ABNORMAL LOW (ref >60)
GLUCOSE: 199 mg/dL — AB (ref 65–99)
Potassium: 4 mmol/L (ref 3.5–5.1)
SODIUM: 137 mmol/L (ref 135–145)
TOTAL PROTEIN: 7.4 g/dL (ref 6.5–8.1)

## 2017-05-09 LAB — CBC
HCT: 41.2 % (ref 35.0–47.0)
Hemoglobin: 14 g/dL (ref 12.0–16.0)
MCH: 31.3 pg (ref 26.0–34.0)
MCHC: 34 g/dL (ref 32.0–36.0)
MCV: 92.1 fL (ref 80.0–100.0)
PLATELETS: 176 10*3/uL (ref 150–440)
RBC: 4.48 MIL/uL (ref 3.80–5.20)
RDW: 13.4 % (ref 11.5–14.5)
WBC: 9.6 10*3/uL (ref 3.6–11.0)

## 2017-05-09 LAB — URINALYSIS, COMPLETE (UACMP) WITH MICROSCOPIC
Bacteria, UA: NONE SEEN
Bilirubin Urine: NEGATIVE
GLUCOSE, UA: NEGATIVE mg/dL
KETONES UR: NEGATIVE mg/dL
Nitrite: NEGATIVE
PH: 5 (ref 5.0–8.0)
Protein, ur: 30 mg/dL — AB
SPECIFIC GRAVITY, URINE: 1.02 (ref 1.005–1.030)

## 2017-05-09 LAB — GLUCOSE, CAPILLARY: GLUCOSE-CAPILLARY: 80 mg/dL (ref 65–99)

## 2017-05-09 LAB — LIPASE, BLOOD: Lipase: 35 U/L (ref 11–51)

## 2017-05-09 MED ORDER — ACETAMINOPHEN 325 MG PO TABS
ORAL_TABLET | ORAL | Status: AC
  Filled 2017-05-09: qty 2

## 2017-05-09 MED ORDER — ONDANSETRON 4 MG PO TBDP: 4.0000 mg | ORAL_TABLET | Freq: Three times a day (TID) | ORAL | 0 refills | Status: DC | PRN

## 2017-05-09 MED ORDER — KETOROLAC TROMETHAMINE 30 MG/ML IJ SOLN
INTRAMUSCULAR | Status: AC
  Filled 2017-05-09: qty 1

## 2017-05-09 MED ORDER — ACETAMINOPHEN 325 MG PO TABS
650.0000 mg | ORAL_TABLET | Freq: Once | ORAL | Status: AC
  Administered 2017-05-09: 650 mg via ORAL

## 2017-05-09 MED ORDER — KETOROLAC TROMETHAMINE 30 MG/ML IJ SOLN
30.0000 mg | Freq: Once | INTRAMUSCULAR | Status: AC
Start: 2017-05-09 — End: 2017-05-09
  Administered 2017-05-09: 30 mg via INTRAMUSCULAR

## 2017-05-09 MED ORDER — HYDROCODONE-ACETAMINOPHEN 5-325 MG PO TABS: 1.0000 | ORAL_TABLET | Freq: Four times a day (QID) | ORAL | 0 refills | Status: DC | PRN

## 2017-05-09 MED ORDER — CEPHALEXIN 500 MG PO CAPS: 500.0000 mg | ORAL_CAPSULE | Freq: Two times a day (BID) | ORAL | 0 refills | Status: DC

## 2017-05-09 NOTE — ED Provider Notes (Signed)
[facility] Emergency Department Provider Note   ____________________________________________    I have reviewed the triage vital signs and the nursing notes.   HISTORY  Chief Complaint Flank Pain     HPI Jessica Cole is a 67 y.o. female with a history of diabetes and a long history of kidney stones who presents with left lower flank pain.  She reports it is been intermittent pain over the last month but has become acutely worse the last several days.  She reports severe cramping pain in her left flank which feels like kidney stone pain to her.  Denies fevers or chills.  No dysuria.  No frequency but has had hematuria.  Saw urologist last week who scheduled CT scan for the 14th but pain has become too severe.  He has not taken anything for this today, tramadol last night   Past Medical History:  Diagnosis Date  . Anxiety   . Colon polyps   . Degenerative arthritis   . Depression   . Diabetes mellitus without complication (HCC)    Type II  . Environmental allergies   . Family history of adverse reaction to anesthesia    sister- PONV  . Fatty liver   . Glaucoma   . Headache(784.0)    HX  MIGRAINES  . History of bronchitis   . History of kidney stones   . History of pneumonia   . Hypertension   . Hypothyroidism   . Obesity   . OSA on CPAP   . Plantar fasciitis    right  . PONV (postoperative nausea and vomiting)   . Renal calculi   . Renal calculi   . RLS (restless legs syndrome) 08/23/2014    Patient Active Problem List   Diagnosis Date Noted  . S/P lumbar spinal fusion 12/13/2015  . Controlled type 2 diabetes mellitus without complication (HCC) 05/16/2015  . Thrombocytopenia (HCC) 05/16/2015  . Recurrent major depressive disorder, in full remission (HCC) 05/16/2015  . Essential (primary) hypertension 05/16/2015  . Fatty infiltration of liver 03/15/2015  . Aortic valve stenosis, nonrheumatic 01/18/2015  . Sciatica of right side  01/09/2015  . Type 2 diabetes mellitus (HCC) 11/10/2014  . RLS (restless legs syndrome) 08/23/2014  . Neuritis or radiculitis due to rupture of lumbar intervertebral disc 06/13/2014  . Degeneration of intervertebral disc of lumbar region 06/13/2014  . Arthritis 01/23/2014  . Arthritis of knee, degenerative 11/15/2013  . Depression 09/29/2013  . Insomnia 09/29/2013  . Apnea, sleep 08/29/2013  . Calculus of kidney 08/29/2013  . Abnormal presence of protein in urine 08/29/2013  . Headache, migraine 08/29/2013  . BP (high blood pressure) 08/29/2013  . HLD (hyperlipidemia) 08/29/2013  . Allergic rhinitis 08/29/2013  . Intractable migraine without aura 02/18/2013    Past Surgical History:  Procedure Laterality Date  . ABDOMINAL HYSTERECTOMY     partial  . APPENDECTOMY    . Arthroscopic surgery, knee Left   . BREAST BIOPSY Right    several  . BREAST BIOPSY Right 03/11/2017   u/s bx neg  . BUNIONECTOMY     LEFT   . COLONOSCOPY W/ POLYPECTOMY    . FOOT SURGERY     RIGHT    . KNEE ARTHROSCOPY W/ MENISCAL REPAIR Left   . LASIK    . MAXIMUM ACCESS (MAS)POSTERIOR LUMBAR INTERBODY FUSION (PLIF) 1 LEVEL N/A 04/25/2016   Procedure: LUMBAR FOUR-FIVE  MAXIMUM ACCESS (MAS) POSTERIOR LUMBAR INTERBODY FUSION (PLIF) with extension of instrumentation LUMBAR  TWO-FIVE;  Surgeon: Tia Alert, MD;  Location: [facility] OR;  Service: Neurosurgery;  Laterality: N/A;  . MAXIMUM ACCESS (MAS)POSTERIOR LUMBAR INTERBODY FUSION (PLIF) 2 LEVEL N/A 12/13/2015   Procedure: Lumbar two-three - Lumbar three-four MAXIMUM ACCESS (MAS) POSTERIOR LUMBAR INTERBODY FUSION (PLIF)  ;  Surgeon: Tia Alert, MD;  Location: [facility] NEURO ORS;  Service: Neurosurgery;  Laterality: N/A;  . SHOULDER SURGERY     RIGHT   . TONSILLECTOMY      Prior to Admission medications   Medication Sig Start Date End Date Taking? Authorizing Provider  azelastine (ASTELIN) 0.1 % nasal spray Place 1 spray into both nostrils 2 (two) times daily.   11/23/15   [provider]  BREO ELLIPTA 200-25 MCG/INH AEPB Take 1 puff daily by mouth. 03/05/17   [provider]  busPIRone (BUSPAR) 7.5 MG tablet Take 7.5 mg 2 (two) times daily as needed by mouth (chest pain).    [provider]  cephALEXin (KEFLEX) 500 MG capsule Take 1 capsule (500 mg total) by mouth 2 (two) times daily. 05/09/17   Jene Every, MD  Coenzyme Q10 (CO Q 10) 100 MG CAPS Take 200 mg by mouth daily.     [provider]  diclofenac sodium (VOLTAREN) 1 % GEL Apply 4 g topically 2 (two) times daily. Patient taking differently: Apply 4 g topically daily as needed (for pain).  08/28/14   Hyatt, Max T, DPM  enalapril (VASOTEC) 10 MG tablet Take 10 mg by mouth daily.    [provider]  estradiol (VIVELLE-DOT) 0.05 MG/24HR patch Place 1 patch onto the skin 2 (two) times a week.  11/07/15   [provider]  fexofenadine (ALLER-EASE) 180 MG tablet Take 180 mg by mouth daily.    [provider]  fluticasone (FLONASE) 50 MCG/ACT nasal spray Place 2 sprays into both nostrils 2 (two) times daily.  04/12/14   [provider]  furosemide (LASIX) 20 MG tablet Take 20 mg by mouth daily as needed.    [provider]  gabapentin (NEURONTIN) 800 MG tablet TAKE ONE (1) TABLET BY MOUTH TWO (2) TIMES DAILY 03/24/17   Butch Penny, NP  HYDROcodone-acetaminophen (NORCO/VICODIN) 5-325 MG tablet Take 1 tablet by mouth every 6 (six) hours as needed for moderate pain. 05/09/17   Jene Every, MD  HYDROcodone-homatropine [facility]) 5-1.5 MG/5ML syrup Take 5 mLs every 6 (six) hours as needed by mouth for cough.    [provider]  latanoprost (XALATAN) 0.005 % ophthalmic solution Place 1 drop into both eyes at bedtime.     [provider]  levothyroxine (SYNTHROID, LEVOTHROID) 112 MCG tablet Take 112 mcg by mouth daily before breakfast.  11/07/15   [provider]  losartan (COZAAR) 50 MG tablet Take 50 mg by  mouth every evening.     [provider]  metFORMIN (GLUCOPHAGE) 500 MG tablet Take 500 mg by mouth 2 (two) times daily with a meal.     [provider]  metoprolol succinate (TOPROL-XL) 25 MG 24 hr tablet TAKE ONE TABLET BY MOUTH EVERY DAY 09/10/16   York Spaniel, MD  montelukast (SINGULAIR) 10 MG tablet Take 10 mg at bedtime by mouth. 01/15/17 01/15/18  [provider]  Multiple Vitamins-Minerals (MULTIVITAMIN ADULT PO) Take by mouth daily.    [provider]  Omega-3 Fatty Acids (FISH OIL ULTRA) 1000 MG CAPS Take 1,000 mg by mouth 2 (two) times daily.     [provider]  ondansetron (  ZOFRAN ODT) 4 MG disintegrating tablet Take 1 tablet (4 mg total) by mouth every 8 (eight) hours as needed for nausea or vomiting. 05/09/17   Jene Every, MD  oxybutynin (DITROPAN) 5 MG tablet Take 5 mg by mouth 2 (two) times daily.    [provider]  potassium chloride (K-DUR,KLOR-CON) 10 MEQ tablet Take 10 mEq by mouth daily as needed.    [provider]  Red Yeast Rice 600 MG CAPS Take 1,200 mg by mouth 2 (two) times daily.    [provider]  rOPINIRole (REQUIP) 4 MG tablet Take 1 tablet (4 mg total) 2 (two) times daily by mouth. 03/24/17   Butch Penny, NP  tiZANidine (ZANAFLEX) 4 MG tablet Take 4 mg by mouth 3 (three) times daily as needed for muscle spasms.    [provider]  topiramate (TOPAMAX) 100 MG tablet Take 1 tablet (100 mg total) at bedtime by mouth. 03/24/17   Butch Penny, NP  traMADol (ULTRAM) 50 MG tablet Take 1 tablet (50 mg total) by mouth every 6 (six) hours as needed. Must last 28 days 01/15/17   York Spaniel, MD     Allergies Diazepam and Dexamethasone  Family History  Problem Relation Age of Onset  . Lung cancer Mother   . Congestive Heart Failure Father   . Depression Sister   . Rheum arthritis Sister   . Headache Maternal Grandfather   . Migraines Maternal Grandfather   . Headache  Maternal Uncle   . Diabetes Unknown   . Heart disease Unknown   . Hypertension Unknown   . Breast cancer Neg Hx   . Bladder Cancer Neg Hx   . Kidney cancer Neg Hx     Social History Social History   Tobacco Use  . Smoking status: Never Smoker  . Smokeless tobacco: Never Used  Substance Use Topics  . Alcohol use: No  . Drug use: No    Review of Systems  Constitutional: No fever/chills Eyes: No visual changes.  ENT: No sore throat. Cardiovascular: Denies chest pain. Respiratory: Denies shortness of breath. Gastrointestinal: As above Genitourinary: Negative for dysuria.  Hematuria as above Musculoskeletal: Negative for back pain. Skin: Negative for rash. Neurological: Negative for headaches    ____________________________________________   PHYSICAL EXAM:  VITAL SIGNS: ED Triage Vitals  Enc Vitals Group     BP 05/09/17 1146 133/67     Pulse Rate 05/09/17 1146 83     Resp 05/09/17 1146 16     Temp 05/09/17 1146 98.3 F (36.8 C)     Temp Source 05/09/17 1146 Oral     SpO2 05/09/17 1146 96 %     Weight 05/09/17 1147 88 kg (194 lb)     Height 05/09/17 1147 1.549 m (5\' 1" )     Head Circumference --      Peak Flow --      Pain Score 05/09/17 1149 8     Pain Loc --      Pain Edu? --      Excl. in GC? --     Constitutional: Alert and oriented. No acute distress.  Eyes: Conjunctivae are normal.   Nose: No congestion/rhinnorhea. Mouth/Throat: Mucous membranes are moist.   Neck:  Painless ROM Cardiovascular: Normal rate, regular rhythm.  Good peripheral circulation. Respiratory: Normal respiratory effort.  No retractions. Gastrointestinal: Soft and nontender. No distention.  No CVA tenderness. Genitourinary: deferred Musculoskeletal:   Warm and well perfused Neurologic:  Normal speech and language. No  gross focal neurologic deficits are appreciated.  Skin:  Skin is warm, dry and intact. No rash noted. Psychiatric: Mood and affect are normal. Speech and  behavior are normal.  ____________________________________________   LABS (all labs ordered are listed, but only abnormal results are displayed)  Labs Reviewed  URINALYSIS, COMPLETE (UACMP) WITH MICROSCOPIC - Abnormal; Notable for the following components:      Result Value   Color, Urine YELLOW (*)    APPearance HAZY (*)    Hgb urine dipstick LARGE (*)    Protein, ur 30 (*)    Leukocytes, UA TRACE (*)    Squamous Epithelial / LPF 0-5 (*)    All other components within normal limits  COMPREHENSIVE METABOLIC PANEL - Abnormal; Notable for the following components:   Glucose, Bld 199 (*)    Creatinine, Ser 1.14 (*)    GFR calc non Af Amer 48 (*)    GFR calc Af Amer 56 (*)    All other components within normal limits  LIPASE, BLOOD  CBC  GLUCOSE, CAPILLARY   ____________________________________________  EKG  None ____________________________________________  RADIOLOGY  CT demonstrates 6 mm UVJ stone ____________________________________________   PROCEDURES  Procedure(s) performed: No  Procedures   Critical Care performed: No ____________________________________________   INITIAL IMPRESSION / ASSESSMENT AND PLAN / ED COURSE  Pertinent labs & imaging results that were available during my care of the patient were reviewed by me and considered in my medical decision making (see chart for details).  Patient presents with left-sided flank pain, long history of ureterolithiasis.  Given her hematuria suspect kidney stone.  Will treat with IM Toradol at her request (not IV) and obtain CT today.  Some leukocytes in urine  CT demonstrates 6 mm UVJ stone, discussed with Dr. Apolinar Junes of urology.  Will start the patient on Cipro, analgesics, Dr. Apolinar Junes will follow up early this week for likely lithotripsy  Patient approves of this plan.  She will return if any fevers or chills or worsening pain      ____________________________________________   FINAL CLINICAL  IMPRESSION(S) / ED DIAGNOSES  Final diagnoses:  Ureterolithiasis        Note:  This document was prepared using Dragon voice recognition software and may include unintentional dictation errors.    Jene Every, MD 05/09/17 [phone]

## 2017-05-09 NOTE — ED Triage Notes (Signed)
Pt reports left flank pain since 04/22/17, reports increase in pain since last night. Hx of kidney stones.

## 2017-05-09 NOTE — ED Triage Notes (Signed)
FIRST NURSE NOTE-no obvious distress.  Ambulatory.

## 2017-05-11 ENCOUNTER — Telehealth: Payer: Self-pay | Admitting: Urology

## 2017-05-11 ENCOUNTER — Other Ambulatory Visit: Payer: Self-pay | Admitting: Neurology

## 2017-05-11 NOTE — Progress Notes (Signed)
05/12/2017 3:43 PM   Marlan Palau 1951/04/19 [id number]  Referring provider: Adin Hector, MD [facility] [place], [place] [address]  Chief Complaint  Patient presents with  . Follow-up    CT results    HPI: 67 yo WF with a left ureteral stone who presents today to discuss definitive treatment for her stone.  On 05/01/2017, she presented to the office with chief complaint of a kidney stone.  The patient stated for approximately 1 week she has had bilateral flank pain that feels like previous obstructive stones.  She endorsed that it is in fact in both sides and does often switch from one side to the other.  She also had nausea and vomiting.  She stated this is how she typically feels when she has nephrolithiasis.  She has had over 20 stone episodes in her lifetime.  She did have a metabolic workup many years ago which per patient she thinks was negative.  She has required lithotripsy in the past.  She has been seen by Dr. Yves Dill in the past for this.  Her last stone composition report from December 2017 revealed 95% calcium oxalate and 5% calcium phosphate.   Her last CT was in December 2017 which showed a 3 mm stone in the right UVJ and punctate nonobstructing lower pole stones on the left side. She did pass the 3 mm stone.  She was scheduled for a CT Renal stone study, but she was having intense flank pain and was seen in the ED.  In the ED on 05/09/2017, she was found to have a 6 mm distal left ureteral calculus at the UVJ. Associated mild left hydroureteronephrosis.  Stable 3.2 cm left ovarian cystic lesion. In a postmenopausal patient, consider follow-up pelvic ultrasound in 1 year.  She was given Tylenol, Vicodin and Keflex.  Her UA was positive for TNTC RBC and 6-30 WBC, serum creatinine was 1.14 (previous creatinine was 0.58) and WBC count was 9.6.    Today, she is having 10/10 left flank pain that started after she gave [place] an urine sample.  She has  Vicodin on hand and that has been controlling her pain at home.  She has been having frequency and nocturia.  She is also having nausea and constipation.  She has not had fevers, chills or vomiting.    PMH: Past Medical History:  Diagnosis Date  . Anxiety   . Colon polyps   . Degenerative arthritis   . Depression   . Diabetes mellitus without complication ([place])    Type II  . Environmental allergies   . Family history of adverse reaction to anesthesia    sister- PONV  . Fatty liver   . Glaucoma   . Headache(784.0)    HX  MIGRAINES  . History of bronchitis   . History of kidney stones   . History of pneumonia   . Hypertension   . Hypothyroidism   . Obesity   . OSA on CPAP   . Plantar fasciitis    right  . PONV (postoperative nausea and vomiting)   . Renal calculi   . Renal calculi   . RLS (restless legs syndrome) 08/23/2014    Surgical History: Past Surgical History:  Procedure Laterality Date  . ABDOMINAL HYSTERECTOMY     partial  . APPENDECTOMY    . Arthroscopic surgery, knee Left   . BREAST BIOPSY Right    several  . BREAST BIOPSY Right 03/11/2017  u/s bx neg  . BUNIONECTOMY     LEFT   . COLONOSCOPY W/ POLYPECTOMY    . FOOT SURGERY     RIGHT    . KNEE ARTHROSCOPY W/ MENISCAL REPAIR Left   . LASIK    . MAXIMUM ACCESS (MAS)POSTERIOR LUMBAR INTERBODY FUSION (PLIF) 1 LEVEL N/A 04/25/2016   Procedure: LUMBAR FOUR-FIVE  MAXIMUM ACCESS (MAS) POSTERIOR LUMBAR INTERBODY FUSION (PLIF) with extension of instrumentation LUMBAR TWO-FIVE;  Surgeon: Eustace Moore, MD;  Location: [facility];  Service: Neurosurgery;  Laterality: N/A;  . MAXIMUM ACCESS (MAS)POSTERIOR LUMBAR INTERBODY FUSION (PLIF) 2 LEVEL N/A 12/13/2015   Procedure: Lumbar two-three - Lumbar three-four MAXIMUM ACCESS (MAS) POSTERIOR LUMBAR INTERBODY FUSION (PLIF)  ;  Surgeon: Eustace Moore, MD;  Location: [facility]URO [facility]S;  Service: Neurosurgery;  Laterality: N/A;  . SHOULDER SURGERY     RIGHT   . TONSILLECTOMY       Home Medications:  Allergies as of 05/12/2017      Reactions   Diazepam    hallucinations   Dexamethasone Other (See Comments)   During a tapered dose, once.  Was shaky (side effect, not allergy).      Medication List        Accurate as of 05/12/17  3:43 PM. Always use your most recent med list.          ALLER-EASE 180 MG tablet Generic drug:  fexofenadine Take 180 mg by mouth daily.   azelastine 0.1 % nasal spray Commonly known as:  ASTELIN Place 1 spray into both nostrils 2 (two) times daily.   BREO ELLIPTA 200-25 MCG/INH Aepb Generic drug:  fluticasone furoate-vilanterol Take 1 puff daily by mouth.   busPIRone 7.5 MG tablet Commonly known as:  BUSPAR Take 7.5 mg 2 (two) times daily as needed by mouth (chest pain).   cephALEXin 500 MG capsule Commonly known as:  KEFLEX Take 1 capsule (500 mg total) by mouth 2 (two) times daily.   Co Q 10 100 MG Caps Take 200 mg by mouth daily.   diclofenac sodium 1 % Gel Commonly known as:  VOLTAREN Apply 4 g topically 2 (two) times daily.   enalapril 10 MG tablet Commonly known as:  VASOTEC Take 10 mg by mouth daily.   estradiol 0.05 MG/24HR patch Commonly known as:  VIVELLE-DOT Place 1 patch onto the skin 2 (two) times a week.   FISH OIL ULTRA 1000 MG Caps Take 1,000 mg by mouth 2 (two) times daily.   fluticasone 50 MCG/ACT nasal spray Commonly known as:  FLONASE Place 2 sprays into both nostrils 2 (two) times daily.   furosemide 20 MG tablet Commonly known as:  LASIX Take 20 mg by mouth daily as needed.   gabapentin 800 MG tablet Commonly known as:  NEURONTIN TAKE ONE (1) TABLET BY MOUTH TWO (2) TIMES DAILY   HYDROcodone-acetaminophen 5-325 MG tablet Commonly known as:  NORCO/VICODIN Take 1 tablet by mouth every 6 (six) hours as needed for moderate pain.   HYDROcodone-homatropine 5-1.5 MG/5ML syrup Commonly known as:  HYCODAN Take 5 mLs every 6 (six) hours as needed by mouth for cough.   latanoprost  0.005 % ophthalmic solution Commonly known as:  XALATAN Place 1 drop into both eyes at bedtime.   levothyroxine 112 MCG tablet Commonly known as:  SYNTHROID, LEVOTHROID Take 112 mcg by mouth daily before breakfast.   losartan 50 MG tablet Commonly known as:  COZAAR Take 50 mg by mouth every evening.  metFORMIN 500 MG tablet Commonly known as:  GLUCOPHAGE Take 500 mg by mouth 2 (two) times daily with a meal.   metoprolol succinate 25 MG 24 hr tablet Commonly known as:  TOPROL-XL TAKE ONE TABLET BY MOUTH EVERY DAY   montelukast 10 MG tablet Commonly known as:  SINGULAIR Take 10 mg at bedtime by mouth.   MULTIVITAMIN ADULT PO Take by mouth daily.   ondansetron 4 MG disintegrating tablet Commonly known as:  ZOFRAN ODT Take 1 tablet (4 mg total) by mouth every 8 (eight) hours as needed for nausea or vomiting.   oxybutynin 5 MG tablet Commonly known as:  DITROPAN Take 5 mg by mouth 2 (two) times daily.   potassium chloride 10 MEQ tablet Commonly known as:  K-DUR,KLOR-CON Take 10 mEq by mouth daily as needed.   Red Yeast Rice 600 MG Caps Take 1,200 mg by mouth 2 (two) times daily.   rOPINIRole 4 MG tablet Commonly known as:  REQUIP Take 1 tablet (4 mg total) 2 (two) times daily by mouth.   tiZANidine 4 MG tablet Commonly known as:  ZANAFLEX Take 4 mg by mouth 3 (three) times daily as needed for muscle spasms.   topiramate 100 MG tablet Commonly known as:  TOPAMAX Take 1 tablet (100 mg total) at bedtime by mouth.   traMADol 50 MG tablet Commonly known as:  ULTRAM Take 1 tablet (50 mg total) by mouth every 6 (six) hours as needed. Must last 28 days       Allergies:  Allergies  Allergen Reactions  . Diazepam     hallucinations  . Dexamethasone Other (See Comments)    During a tapered dose, once.  Was shaky (side effect, not allergy).    Family History: Family History  Problem Relation Age of Onset  . Lung cancer Mother   . Congestive Heart Failure  Father   . Depression Sister   . Rheum arthritis Sister   . Headache Maternal Grandfather   . Migraines Maternal Grandfather   . Headache Maternal Uncle   . Diabetes Unknown   . Heart disease Unknown   . Hypertension Unknown   . Breast cancer Neg Hx   . Bladder Cancer Neg Hx   . Kidney cancer Neg Hx     Social History:  reports that  has never smoked. she has never used smokeless tobacco. She reports that she does not drink alcohol or use drugs.  ROS: UROLOGY Frequent Urination?: Yes Hard to postpone urination?: No Burning/pain with urination?: No Get up at night to urinate?: Yes Leakage of urine?: No Urine stream starts and stops?: No Trouble starting stream?: No Do you have to strain to urinate?: No Blood in urine?: No Urinary tract infection?: No Sexually transmitted disease?: No Injury to kidneys or bladder?: No Painful intercourse?: No Weak stream?: No Currently pregnant?: No Vaginal bleeding?: No Last menstrual period?: n  Gastrointestinal Nausea?: Yes Vomiting?: No Indigestion/heartburn?: No Diarrhea?: No Constipation?: Yes  Constitutional Fever: No Night sweats?: No Weight loss?: No Fatigue?: No  Skin Skin rash/lesions?: No Itching?: No  Eyes Blurred vision?: No Double vision?: No  Ears/Nose/Throat Sore throat?: No Sinus problems?: No  Hematologic/Lymphatic Swollen glands?: No Easy bruising?: Yes  Cardiovascular Leg swelling?: No Chest pain?: No  Respiratory Cough?: No Shortness of breath?: No  Endocrine Excessive thirst?: No  Musculoskeletal Back pain?: No Joint pain?: Yes  Neurological Headaches?: Yes Dizziness?: No  Psychologic Depression?: No Anxiety?: No  Physical Exam: BP (!) 147/79 (BP Location:  Right Arm, Patient Position: Sitting, Cuff Size: Large)   Pulse 72   Ht '5\' 1"'$  (1.549 m)   Wt 199 lb 9.6 oz (90.5 kg)   BMI 37.71 kg/m   Constitutional: Well nourished. Alert and oriented, Acute distress.  Pacing in  the room.  HEENT: [place] AT, moist mucus membranes. Trachea midline, no masses. Cardiovascular: No clubbing, cyanosis, or edema. Respiratory: Normal respiratory effort, no increased work of breathing. GI: Abdomen is soft, non tender, non distended, no abdominal masses. Liver and spleen not palpable.  No hernias appreciated.  Stool sample for occult testing is not indicated.   GU: No CVA tenderness.  No bladder fullness or masses.   Skin: No rashes, bruises or suspicious lesions. Lymph: No cervical or inguinal adenopathy. Neurologic: Grossly intact, no focal deficits, moving all 4 extremities. Psychiatric: Normal mood and affect.  Laboratory Data: Lab Results  Component Value Date   WBC 9.6 05/09/2017   HGB 14.0 05/09/2017   HCT 41.2 05/09/2017   MCV 92.1 05/09/2017   PLT 176 05/09/2017    Lab Results  Component Value Date   CREATININE 1.14 (H) 05/09/2017   I have reviewed labs.    Pertinent Imaging: CLINICAL DATA:  Left flank pain, history of kidney stones  EXAM: CT ABDOMEN AND PELVIS WITHOUT CONTRAST  TECHNIQUE: Multidetector CT imaging of the abdomen and pelvis was performed following the standard protocol without IV contrast.  COMPARISON:  04/11/2016  FINDINGS: Lower chest: Lung bases are essentially clear.  Hepatobiliary: Liver is notable for a mildly nodular hepatic contour but is otherwise within normal limits.  Gallbladder is unremarkable. No intrahepatic or extrahepatic duct dilatation.  Pancreas: Within normal limits.  Spleen: Within normal limits.  Adrenals/Urinary Tract: Adrenal glands are within normal limits.  Right kidney is within normal limits.  Left kidney is notable for nonspecific perinephric stranding and mild left hydroureteronephrosis. Associated 6 mm distal left ureteral calculus at the UVJ (coronal image 104).  Bladder is within normal limits.  Stomach/Bowel: Stomach is within normal limits.  No evidence of bowel  obstruction.  Appendix is not discretely visualized.  Left colonic diverticulosis, without evidence of diverticulitis.  Vascular/Lymphatic: No evidence of abdominal aortic aneurysm.  Atherosclerotic calcifications of the abdominal aorta and branch vessels.  No suspicious abdominopelvic lymphadenopathy.  Reproductive: Status post hysterectomy.  Right ovary is within normal limits.  3.2 cm left ovarian cystic lesion (series 2/image 66), previously 3.1 cm.  Other: No abdominopelvic ascites.  Musculoskeletal: Mild degenerative changes of the visualized thoracolumbar spine.  Status post PLIF at L2-5.  IMPRESSION: 6 mm distal left ureteral calculus at the UVJ. Associated mild left hydroureteronephrosis.  Stable 3.2 cm left ovarian cystic lesion. In a postmenopausal patient, consider follow-up pelvic ultrasound in 1 year.   Electronically Signed   By: Julian Hy M.D.   On: 05/09/2017 16:31  I have independently reviewed the films.    Assessment & Plan:   1. Left ureteral stone  - explained to the patient that AUA Guidelines for patients with uncomplicated ureteral stones ?10 mm should be offered observation, and those with distal stones of similar size should be offered MET with ?-blockers  - if after 4 to 6 weeks observation with or without MET is not successful we would pursue URS or ESWL, if the patient/clinician decide to intervene sooner based on a shared decision making approach, the clinicians should offer definitive stone treatment  -URS would be the first lined therapy if stone(s) do not pass, but ESWL  is the procedure with the least morbidity and lowest complication rate, but URS has a greater stone-free rate in a single procedure  - she would like to have ESWL as she has had it in the past and has the earliest availability  - she will be schedule for left ESWL  - I explained that ESWL is a means of pulverizing urinary stones without surgery  using shockwave therapy  - I discussed the risks involved with ESWL consist of bruising to the skin and kidney region as a result of the shockwave, possibility of long-term kidney damage, development of high blood pressure and damage to the bowel or long, hematuria, urinary bleeding serious enough to require transfusion or surgical repair or removal of the kidney is rare, rare chance of hematoma formation in the kidney and injuries to the spleen, liver or pancreas.  There is also the risk of urinary tract infection or any infection of the blood system or tissue.   There is also a possibility of resultant damage to female organs.  - I explained that sometimes the stone fragments can stack up in the ureter like coins, a phenomenon described as "Steinstrasse", that would result in a stent placement and/or URS for further treatment  - I informed the patient that IV sedation is typically used on the truck, but it some rare instances we need to use general anesthesia - the risks being infection, irregular heart beat, irregular BP, stroke, MI, CVA, paralysis, coma and/or death.  - Advised to contact our office or seek treatment in the ED if becomes febrile or pain/ vomiting are difficult control in order to arrange for emergent/urgent intervention   2. Left hydronephrosis  - obtain RUS to ensure the hydronephrosis has resolved once she has recovered from ESWL  3. Microscopic hematuria  - continue to monitor the patient's UA after the treatment/passage of the stone to ensure the hematuria has resolved  - if hematuria persists, we will pursue a hematuria workup with CT Urogram and cystoscopy if appropriate.  4. History of nephrolithiasis  - will offer repeat metabolic workup once she has recovered from ESWL  5. Left ovarian lesion  - given copy of report to patient and advised her to follow up with PCP - advised that the lesion may represent an ovarian cancer and she needs to have follow up imaging in one  year   Return for left ESWL.  Zara Council, [facility] [address], [place], [place] [address] [phone]

## 2017-05-11 NOTE — H&P (View-Only) (Signed)
05/12/2017 3:43 PM   Jessica Cole 06/02/1950 [id number]  Referring provider: Adin Hector, MD [facility] [place], [place] [address]  Chief Complaint  Patient presents with  . Follow-up    CT results    HPI: 67 yo WF with a left ureteral stone who presents today to discuss definitive treatment for her stone.  On 05/01/2017, she presented to the office with chief complaint of a kidney stone.  The patient stated for approximately 1 week she has had bilateral flank pain that feels like previous obstructive stones.  She endorsed that it is in fact in both sides and does often switch from one side to the other.  She also had nausea and vomiting.  She stated this is how she typically feels when she has nephrolithiasis.  She has had over 20 stone episodes in her lifetime.  She did have a metabolic workup many years ago which per patient she thinks was negative.  She has required lithotripsy in the past.  She has been seen by Dr. Yves Dill in the past for this.  Her last stone composition report from December 2017 revealed 95% calcium oxalate and 5% calcium phosphate.   Her last CT was in December 2017 which showed a 3 mm stone in the right UVJ and punctate nonobstructing lower pole stones on the left side. She did pass the 3 mm stone.  She was scheduled for a CT Renal stone study, but she was having intense flank pain and was seen in the ED.  In the ED on 05/09/2017, she was found to have a 6 mm distal left ureteral calculus at the UVJ. Associated mild left hydroureteronephrosis.  Stable 3.2 cm left ovarian cystic lesion. In a postmenopausal patient, consider follow-up pelvic ultrasound in 1 year.  She was given Tylenol, Vicodin and Keflex.  Her UA was positive for TNTC RBC and 6-30 WBC, serum creatinine was 1.14 (previous creatinine was 0.58) and WBC count was 9.6.    Today, she is having 10/10 left flank pain that started after she gave [place] an urine sample.  She has  Vicodin on hand and that has been controlling her pain at home.  She has been having frequency and nocturia.  She is also having nausea and constipation.  She has not had fevers, chills or vomiting.    PMH: Past Medical History:  Diagnosis Date  . Anxiety   . Colon polyps   . Degenerative arthritis   . Depression   . Diabetes mellitus without complication ([name])    Type II  . Environmental allergies   . Family history of adverse reaction to anesthesia    sister- PONV  . Fatty liver   . Glaucoma   . Headache(784.0)    HX  MIGRAINES  . History of bronchitis   . History of kidney stones   . History of pneumonia   . Hypertension   . Hypothyroidism   . Obesity   . OSA on CPAP   . Plantar fasciitis    right  . PONV (postoperative nausea and vomiting)   . Renal calculi   . Renal calculi   . RLS (restless legs syndrome) 08/23/2014    Surgical History: Past Surgical History:  Procedure Laterality Date  . ABDOMINAL HYSTERECTOMY     partial  . APPENDECTOMY    . Arthroscopic surgery, knee Left   . BREAST BIOPSY Right    several  . BREAST BIOPSY Right 03/11/2017  u/s bx neg  . BUNIONECTOMY     LEFT   . COLONOSCOPY W/ POLYPECTOMY    . FOOT SURGERY     RIGHT    . KNEE ARTHROSCOPY W/ MENISCAL REPAIR Left   . LASIK    . MAXIMUM ACCESS (MAS)POSTERIOR LUMBAR INTERBODY FUSION (PLIF) 1 LEVEL N/A 04/25/2016   Procedure: LUMBAR FOUR-FIVE  MAXIMUM ACCESS (MAS) POSTERIOR LUMBAR INTERBODY FUSION (PLIF) with extension of instrumentation LUMBAR TWO-FIVE;  Surgeon: Eustace Moore, MD;  Location: [facility];  Service: Neurosurgery;  Laterality: N/A;  . MAXIMUM ACCESS (MAS)POSTERIOR LUMBAR INTERBODY FUSION (PLIF) 2 LEVEL N/A 12/13/2015   Procedure: Lumbar two-three - Lumbar three-four MAXIMUM ACCESS (MAS) POSTERIOR LUMBAR INTERBODY FUSION (PLIF)  ;  Surgeon: Eustace Moore, MD;  Location[facility];  Service: Neurosurgery;  Laterality: N/A;  . SHOULDER SURGERY     RIGHT   . TONSILLECTOMY       Home Medications:  Allergies as of 05/12/2017      Reactions   Diazepam    hallucinations   Dexamethasone Other (See Comments)   During a tapered dose, once.  Was shaky (side effect, not allergy).      Medication List        Accurate as of 05/12/17  3:43 PM. Always use your most recent med list.          ALLER-EASE 180 MG tablet Generic drug:  fexofenadine Take 180 mg by mouth daily.   azelastine 0.1 % nasal spray Commonly known as:  ASTELIN Place 1 spray into both nostrils 2 (two) times daily.   BREO ELLIPTA 200-25 MCG/INH Aepb Generic drug:  fluticasone furoate-vilanterol Take 1 puff daily by mouth.   busPIRone 7.5 MG tablet Commonly known as:  BUSPAR Take 7.5 mg 2 (two) times daily as needed by mouth (chest pain).   cephALEXin 500 MG capsule Commonly known as:  KEFLEX Take 1 capsule (500 mg total) by mouth 2 (two) times daily.   Co Q 10 100 MG Caps Take 200 mg by mouth daily.   diclofenac sodium 1 % Gel Commonly known as:  VOLTAREN Apply 4 g topically 2 (two) times daily.   enalapril 10 MG tablet Commonly known as:  VASOTEC Take 10 mg by mouth daily.   estradiol 0.05 MG/24HR patch Commonly known as:  VIVELLE-DOT Place 1 patch onto the skin 2 (two) times a week.   FISH OIL ULTRA 1000 MG Caps Take 1,000 mg by mouth 2 (two) times daily.   fluticasone 50 MCG/ACT nasal spray Commonly known as:  FLONASE Place 2 sprays into both nostrils 2 (two) times daily.   furosemide 20 MG tablet Commonly known as:  LASIX Take 20 mg by mouth daily as needed.   gabapentin 800 MG tablet Commonly known as:  NEURONTIN TAKE ONE (1) TABLET BY MOUTH TWO (2) TIMES DAILY   HYDROcodone-acetaminophen 5-325 MG tablet Commonly known as:  NORCO/VICODIN Take 1 tablet by mouth every 6 (six) hours as needed for moderate pain.   HYDROcodone-homatropine 5-1.5 MG/5ML syrup Commonly known as:  HYCODAN Take 5 mLs every 6 (six) hours as needed by mouth for cough.   latanoprost  0.005 % ophthalmic solution Commonly known as:  XALATAN Place 1 drop into both eyes at bedtime.   levothyroxine 112 MCG tablet Commonly known as:  SYNTHROID, LEVOTHROID Take 112 mcg by mouth daily before breakfast.   losartan 50 MG tablet Commonly known as:  COZAAR Take 50 mg by mouth every evening.  metFORMIN 500 MG tablet Commonly known as:  GLUCOPHAGE Take 500 mg by mouth 2 (two) times daily with a meal.   metoprolol succinate 25 MG 24 hr tablet Commonly known as:  TOPROL-XL TAKE ONE TABLET BY MOUTH EVERY DAY   montelukast 10 MG tablet Commonly known as:  SINGULAIR Take 10 mg at bedtime by mouth.   MULTIVITAMIN ADULT PO Take by mouth daily.   ondansetron 4 MG disintegrating tablet Commonly known as:  ZOFRAN ODT Take 1 tablet (4 mg total) by mouth every 8 (eight) hours as needed for nausea or vomiting.   oxybutynin 5 MG tablet Commonly known as:  DITROPAN Take 5 mg by mouth 2 (two) times daily.   potassium chloride 10 MEQ tablet Commonly known as:  K-DUR,KLOR-CON Take 10 mEq by mouth daily as needed.   Red Yeast Rice 600 MG Caps Take 1,200 mg by mouth 2 (two) times daily.   rOPINIRole 4 MG tablet Commonly known as:  REQUIP Take 1 tablet (4 mg total) 2 (two) times daily by mouth.   tiZANidine 4 MG tablet Commonly known as:  ZANAFLEX Take 4 mg by mouth 3 (three) times daily as needed for muscle spasms.   topiramate 100 MG tablet Commonly known as:  TOPAMAX Take 1 tablet (100 mg total) at bedtime by mouth.   traMADol 50 MG tablet Commonly known as:  ULTRAM Take 1 tablet (50 mg total) by mouth every 6 (six) hours as needed. Must last 28 days       Allergies:  Allergies  Allergen Reactions  . Diazepam     hallucinations  . Dexamethasone Other (See Comments)    During a tapered dose, once.  Was shaky (side effect, not allergy).    Family History: Family History  Problem Relation Age of Onset  . Lung cancer Mother   . Congestive Heart Failure  Father   . Depression Sister   . Rheum arthritis Sister   . Headache Maternal Grandfather   . Migraines Maternal Grandfather   . Headache Maternal Uncle   . Diabetes Unknown   . Heart disease Unknown   . Hypertension Unknown   . Breast cancer Neg Hx   . Bladder Cancer Neg Hx   . Kidney cancer Neg Hx     Social History:  reports that  has never smoked. she has never used smokeless tobacco. She reports that she does not drink alcohol or use drugs.  ROS: UROLOGY Frequent Urination?: Yes Hard to postpone urination?: No Burning/pain with urination?: No Get up at night to urinate?: Yes Leakage of urine?: No Urine stream starts and stops?: No Trouble starting stream?: No Do you have to strain to urinate?: No Blood in urine?: No Urinary tract infection?: No Sexually transmitted disease?: No Injury to kidneys or bladder?: No Painful intercourse?: No Weak stream?: No Currently pregnant?: No Vaginal bleeding?: No Last menstrual period?: n  Gastrointestinal Nausea?: Yes Vomiting?: No Indigestion/heartburn?: No Diarrhea?: No Constipation?: Yes  Constitutional Fever: No Night sweats?: No Weight loss?: No Fatigue?: No  Skin Skin rash/lesions?: No Itching?: No  Eyes Blurred vision?: No Double vision?: No  Ears/Nose/Throat Sore throat?: No Sinus problems?: No  Hematologic/Lymphatic Swollen glands?: No Easy bruising?: Yes  Cardiovascular Leg swelling?: No Chest pain?: No  Respiratory Cough?: No Shortness of breath?: No  Endocrine Excessive thirst?: No  Musculoskeletal Back pain?: No Joint pain?: Yes  Neurological Headaches?: Yes Dizziness?: No  Psychologic Depression?: No Anxiety?: No  Physical Exam: BP (!) 147/79 (BP Location:  Right Arm, Patient Position: Sitting, Cuff Size: Large)   Pulse 72   Ht '5\' 1"'$  (1.549 m)   Wt 199 lb 9.6 oz (90.5 kg)   BMI 37.71 kg/m   Constitutional: Well nourished. Alert and oriented, Acute distress.  Pacing in  the room.  HEENT: [place] AT, moist mucus membranes. Trachea midline, no masses. Cardiovascular: No clubbing, cyanosis, or edema. Respiratory: Normal respiratory effort, no increased work of breathing. GI: Abdomen is soft, non tender, non distended, no abdominal masses. Liver and spleen not palpable.  No hernias appreciated.  Stool sample for occult testing is not indicated.   GU: No CVA tenderness.  No bladder fullness or masses.   Skin: No rashes, bruises or suspicious lesions. Lymph: No cervical or inguinal adenopathy. Neurologic: Grossly intact, no focal deficits, moving all 4 extremities. Psychiatric: Normal mood and affect.  Laboratory Data: Lab Results  Component Value Date   WBC 9.6 05/09/2017   HGB 14.0 05/09/2017   HCT 41.2 05/09/2017   MCV 92.1 05/09/2017   PLT 176 05/09/2017    Lab Results  Component Value Date   CREATININE 1.14 (H) 05/09/2017   I have reviewed labs.    Pertinent Imaging: CLINICAL DATA:  Left flank pain, history of kidney stones  EXAM: CT ABDOMEN AND PELVIS WITHOUT CONTRAST  TECHNIQUE: Multidetector CT imaging of the abdomen and pelvis was performed following the standard protocol without IV contrast.  COMPARISON:  04/11/2016  FINDINGS: Lower chest: Lung bases are essentially clear.  Hepatobiliary: Liver is notable for a mildly nodular hepatic contour but is otherwise within normal limits.  Gallbladder is unremarkable. No intrahepatic or extrahepatic duct dilatation.  Pancreas: Within normal limits.  Spleen: Within normal limits.  Adrenals/Urinary Tract: Adrenal glands are within normal limits.  Right kidney is within normal limits.  Left kidney is notable for nonspecific perinephric stranding and mild left hydroureteronephrosis. Associated 6 mm distal left ureteral calculus at the UVJ (coronal image 104).  Bladder is within normal limits.  Stomach/Bowel: Stomach is within normal limits.  No evidence of bowel  obstruction.  Appendix is not discretely visualized.  Left colonic diverticulosis, without evidence of diverticulitis.  Vascular/Lymphatic: No evidence of abdominal aortic aneurysm.  Atherosclerotic calcifications of the abdominal aorta and branch vessels.  No suspicious abdominopelvic lymphadenopathy.  Reproductive: Status post hysterectomy.  Right ovary is within normal limits.  3.2 cm left ovarian cystic lesion (series 2/image 66), previously 3.1 cm.  Other: No abdominopelvic ascites.  Musculoskeletal: Mild degenerative changes of the visualized thoracolumbar spine.  Status post PLIF at L2-5.  IMPRESSION: 6 mm distal left ureteral calculus at the UVJ. Associated mild left hydroureteronephrosis.  Stable 3.2 cm left ovarian cystic lesion. In a postmenopausal patient, consider follow-up pelvic ultrasound in 1 year.   Electronically Signed   By: Julian Hy M.D.   On: 05/09/2017 16:31  I have independently reviewed the films.    Assessment & Plan:   1. Left ureteral stone  - explained to the patient that AUA Guidelines for patients with uncomplicated ureteral stones ?10 mm should be offered observation, and those with distal stones of similar size should be offered MET with ?-blockers  - if after 4 to 6 weeks observation with or without MET is not successful we would pursue URS or ESWL, if the patient/clinician decide to intervene sooner based on a shared decision making approach, the clinicians should offer definitive stone treatment  -URS would be the first lined therapy if stone(s) do not pass, but ESWL  is the procedure with the least morbidity and lowest complication rate, but URS has a greater stone-free rate in a single procedure  - she would like to have ESWL as she has had it in the past and has the earliest availability  - she will be schedule for left ESWL  - I explained that ESWL is a means of pulverizing urinary stones without surgery  using shockwave therapy  - I discussed the risks involved with ESWL consist of bruising to the skin and kidney region as a result of the shockwave, possibility of long-term kidney damage, development of high blood pressure and damage to the bowel or long, hematuria, urinary bleeding serious enough to require transfusion or surgical repair or removal of the kidney is rare, rare chance of hematoma formation in the kidney and injuries to the spleen, liver or pancreas.  There is also the risk of urinary tract infection or any infection of the blood system or tissue.   There is also a possibility of resultant damage to female organs.  - I explained that sometimes the stone fragments can stack up in the ureter like coins, a phenomenon described as "Steinstrasse", that would result in a stent placement and/or URS for further treatment  - I informed the patient that IV sedation is typically used on the truck, but it some rare instances we need to use general anesthesia - the risks being infection, irregular heart beat, irregular BP, stroke, MI, CVA, paralysis, coma and/or death.  - Advised to contact our office or seek treatment in the ED if becomes febrile or pain/ vomiting are difficult control in order to arrange for emergent/urgent intervention   2. Left hydronephrosis  - obtain RUS to ensure the hydronephrosis has resolved once she has recovered from ESWL  3. Microscopic hematuria  - continue to monitor the patient's UA after the treatment/passage of the stone to ensure the hematuria has resolved  - if hematuria persists, we will pursue a hematuria workup with CT Urogram and cystoscopy if appropriate.  4. History of nephrolithiasis  - will offer repeat metabolic workup once she has recovered from ESWL  5. Left ovarian lesion  - given copy of report to patient and advised her to follow up with PCP - advised that the lesion may represent an ovarian cancer and she needs to have follow up imaging in one  year   Return for left ESWL.  Zara Council, [facility] [address], [address], [place] [address] [phone]

## 2017-05-11 NOTE — Telephone Encounter (Signed)
-----   Message from Vanna ScotlandAshley Brandon, MD sent at 05/10/2017  3:58 PM EST ----- Regarding: Please arrange f/u early this week with any provider to schedule surgery   ----- Message ----- From: Jene EveryKinner, Robert, MD Sent: 05/09/2017   4:42 PM To: Vanna ScotlandAshley Brandon, MD  6 mm L UVJ stone x ~1 month pain intermittent

## 2017-05-11 NOTE — Telephone Encounter (Signed)
App scheduled ° °Jessica Cole °

## 2017-05-12 ENCOUNTER — Other Ambulatory Visit: Payer: Self-pay | Admitting: Radiology

## 2017-05-12 ENCOUNTER — Ambulatory Visit: Payer: Medicare HMO | Admitting: Urology

## 2017-05-12 ENCOUNTER — Encounter: Payer: Self-pay | Admitting: Urology

## 2017-05-12 VITALS — BP 147/79 | HR 72 | Ht 61.0 in | Wt 199.6 lb

## 2017-05-12 DIAGNOSIS — N839 Noninflammatory disorder of ovary, fallopian tube and broad ligament, unspecified: Secondary | ICD-10-CM

## 2017-05-12 DIAGNOSIS — N132 Hydronephrosis with renal and ureteral calculous obstruction: Secondary | ICD-10-CM | POA: Diagnosis not present

## 2017-05-12 DIAGNOSIS — N201 Calculus of ureter: Secondary | ICD-10-CM | POA: Diagnosis not present

## 2017-05-12 DIAGNOSIS — Z87442 Personal history of urinary calculi: Secondary | ICD-10-CM

## 2017-05-12 DIAGNOSIS — R3129 Other microscopic hematuria: Secondary | ICD-10-CM

## 2017-05-12 DIAGNOSIS — N838 Other noninflammatory disorders of ovary, fallopian tube and broad ligament: Secondary | ICD-10-CM

## 2017-05-13 ENCOUNTER — Other Ambulatory Visit: Payer: Self-pay | Admitting: Radiology

## 2017-05-13 DIAGNOSIS — N201 Calculus of ureter: Secondary | ICD-10-CM

## 2017-05-13 MED ORDER — CIPROFLOXACIN HCL 500 MG PO TABS
500.0000 mg | ORAL_TABLET | ORAL | Status: AC
  Administered 2017-05-14: 500 mg via ORAL

## 2017-05-14 ENCOUNTER — Ambulatory Visit
Admission: RE | Admit: 2017-05-14 | Discharge: 2017-05-14 | Disposition: A | Discharge status: Home / Self Care | Payer: Medicare HMO | Source: Ambulatory Visit | Attending: Urology | Admitting: Urology

## 2017-05-14 ENCOUNTER — Ambulatory Visit: Payer: Medicare HMO

## 2017-05-14 ENCOUNTER — Encounter
Admission: RE | Disposition: A | Discharge status: Home / Self Care | Payer: Self-pay | Source: Ambulatory Visit | Attending: Urology

## 2017-05-14 DIAGNOSIS — N132 Hydronephrosis with renal and ureteral calculous obstruction: Secondary | ICD-10-CM | POA: Diagnosis not present

## 2017-05-14 DIAGNOSIS — G4733 Obstructive sleep apnea (adult) (pediatric): Secondary | ICD-10-CM | POA: Diagnosis not present

## 2017-05-14 DIAGNOSIS — E119 Type 2 diabetes mellitus without complications: Secondary | ICD-10-CM | POA: Diagnosis not present

## 2017-05-14 DIAGNOSIS — F329 Major depressive disorder, single episode, unspecified: Secondary | ICD-10-CM | POA: Diagnosis not present

## 2017-05-14 DIAGNOSIS — N201 Calculus of ureter: Secondary | ICD-10-CM

## 2017-05-14 DIAGNOSIS — Z6837 Body mass index (BMI) 37.0-37.9, adult: Secondary | ICD-10-CM | POA: Insufficient documentation

## 2017-05-14 DIAGNOSIS — M1990 Unspecified osteoarthritis, unspecified site: Secondary | ICD-10-CM | POA: Insufficient documentation

## 2017-05-14 DIAGNOSIS — K760 Fatty (change of) liver, not elsewhere classified: Secondary | ICD-10-CM | POA: Diagnosis not present

## 2017-05-14 DIAGNOSIS — I10 Essential (primary) hypertension: Secondary | ICD-10-CM | POA: Insufficient documentation

## 2017-05-14 DIAGNOSIS — Z9989 Dependence on other enabling machines and devices: Secondary | ICD-10-CM | POA: Diagnosis not present

## 2017-05-14 DIAGNOSIS — G2581 Restless legs syndrome: Secondary | ICD-10-CM | POA: Diagnosis not present

## 2017-05-14 DIAGNOSIS — Z79899 Other long term (current) drug therapy: Secondary | ICD-10-CM | POA: Diagnosis not present

## 2017-05-14 DIAGNOSIS — Z87442 Personal history of urinary calculi: Secondary | ICD-10-CM | POA: Insufficient documentation

## 2017-05-14 DIAGNOSIS — E039 Hypothyroidism, unspecified: Secondary | ICD-10-CM | POA: Diagnosis not present

## 2017-05-14 DIAGNOSIS — E669 Obesity, unspecified: Secondary | ICD-10-CM | POA: Diagnosis not present

## 2017-05-14 DIAGNOSIS — F419 Anxiety disorder, unspecified: Secondary | ICD-10-CM | POA: Insufficient documentation

## 2017-05-14 DIAGNOSIS — N83202 Unspecified ovarian cyst, left side: Secondary | ICD-10-CM | POA: Diagnosis not present

## 2017-05-14 DIAGNOSIS — Z7984 Long term (current) use of oral hypoglycemic drugs: Secondary | ICD-10-CM | POA: Insufficient documentation

## 2017-05-14 DIAGNOSIS — Z888 Allergy status to other drugs, medicaments and biological substances status: Secondary | ICD-10-CM | POA: Insufficient documentation

## 2017-05-14 HISTORY — PX: EXTRACORPOREAL SHOCK WAVE LITHOTRIPSY: SHX1557

## 2017-05-14 LAB — GLUCOSE, CAPILLARY: GLUCOSE-CAPILLARY: 129 mg/dL — AB (ref 65–99)

## 2017-05-14 SURGERY — LITHOTRIPSY, ESWL
Anesthesia: Moderate Sedation | Laterality: Left

## 2017-05-14 MED ORDER — ONDANSETRON HCL 4 MG/2ML IJ SOLN
INTRAMUSCULAR | Status: AC
  Administered 2017-05-14: 4 mg via INTRAVENOUS
  Filled 2017-05-14: qty 2

## 2017-05-14 MED ORDER — DIPHENHYDRAMINE HCL 25 MG PO CAPS
ORAL_CAPSULE | ORAL | Status: AC
  Administered 2017-05-14: 25 mg via ORAL
  Filled 2017-05-14: qty 1

## 2017-05-14 MED ORDER — DIPHENHYDRAMINE HCL 25 MG PO CAPS
25.0000 mg | ORAL_CAPSULE | ORAL | Status: AC
  Administered 2017-05-14: 25 mg via ORAL

## 2017-05-14 MED ORDER — CIPROFLOXACIN HCL 500 MG PO TABS
ORAL_TABLET | ORAL | Status: AC
  Administered 2017-05-14: 500 mg via ORAL
  Filled 2017-05-14: qty 1

## 2017-05-14 MED ORDER — ONDANSETRON HCL 4 MG/2ML IJ SOLN
4.0000 mg | Freq: Once | INTRAMUSCULAR | Status: AC
  Administered 2017-05-14: 4 mg via INTRAVENOUS

## 2017-05-14 MED ORDER — SODIUM CHLORIDE 0.9 % IV SOLN
INTRAVENOUS | Status: DC
  Administered 2017-05-14 (×2): via INTRAVENOUS

## 2017-05-14 MED ORDER — TAMSULOSIN HCL 0.4 MG PO CAPS: 0.4000 mg | ORAL_CAPSULE | Freq: Every day | ORAL | 0 refills | Status: DC

## 2017-05-14 NOTE — Interval H&P Note (Signed)
History and Physical Interval Note:  05/14/2017 8:14 AM  Jessica Cole  has presented today for surgery, with the diagnosis of Kidney stone  The various methods of treatment have been discussed with the patient and family. After consideration of risks, benefits and other options for treatment, the patient has consented to  Procedure(s): EXTRACORPOREAL SHOCK WAVE LITHOTRIPSY (ESWL) (Left) as a surgical intervention .  The patient's history has been reviewed, patient examined, no change in status, stable for surgery.  I have reviewed the patient's chart and labs.  Questions were answered to the patient's satisfaction.     Scott C Stoioff

## 2017-05-14 NOTE — Discharge Instructions (Signed)
Follow [facility] Discharge Instruction Sheet as reviewed.  AMBULATORY SURGERY  DISCHARGE INSTRUCTIONS   1) The drugs that you were given will stay in your system until tomorrow so for the next 24 hours you should not:  A) Drive an automobile B) Make any legal decisions C) Drink any alcoholic beverage   2) You may resume regular meals tomorrow.  Today it is better to start with liquids and gradually work up to solid foods.  You may eat anything you prefer, but it is better to start with liquids, then soup and crackers, and gradually work up to solid foods.   3) Please notify your doctor immediately if you have any unusual bleeding, trouble breathing, redness and pain at the surgery site, drainage, fever, or pain not relieved by medication.    4) Additional Instructions:        Please contact your physician with any problems or Same Day Surgery at [phone], Monday through Friday 6 am to 4 pm, or [place] at [facility] Main number at [phone].

## 2017-05-15 ENCOUNTER — Encounter: Payer: Self-pay | Admitting: Urology

## 2017-05-15 LAB — CULTURE, URINE COMPREHENSIVE

## 2017-05-18 ENCOUNTER — Ambulatory Visit: Payer: Medicare HMO

## 2017-05-22 ENCOUNTER — Telehealth: Payer: Self-pay | Admitting: Radiology

## 2017-05-22 ENCOUNTER — Ambulatory Visit: Payer: Medicare HMO

## 2017-05-22 NOTE — Telephone Encounter (Signed)
Pt states she hasn't passed any stone fragments s/p ESWL on 05/14/2017 & is having intermittent pain requiring pain medication. Per Dr Lonna CobbStoioff, moved pt's f/u appt to 05/26/2017 with [facility] prior. Pt voices understanding.

## 2017-05-26 ENCOUNTER — Encounter: Payer: Self-pay | Admitting: Urology

## 2017-05-26 ENCOUNTER — Ambulatory Visit
Admission: RE | Admit: 2017-05-26 | Discharge: 2017-05-26 | Disposition: A | Discharge status: Home / Self Care | Payer: Medicare HMO | Source: Ambulatory Visit | Attending: Urology | Admitting: Urology

## 2017-05-26 ENCOUNTER — Ambulatory Visit (INDEPENDENT_AMBULATORY_CARE_PROVIDER_SITE_OTHER): Payer: Medicare HMO | Admitting: Urology

## 2017-05-26 VITALS — BP 144/84 | HR 71 | Ht 61.0 in | Wt 193.0 lb

## 2017-05-26 DIAGNOSIS — N201 Calculus of ureter: Secondary | ICD-10-CM | POA: Diagnosis not present

## 2017-05-26 DIAGNOSIS — R1032 Left lower quadrant pain: Secondary | ICD-10-CM

## 2017-05-26 LAB — MICROSCOPIC EXAMINATION: RBC, UA: NONE SEEN /hpf (ref 0–?)

## 2017-05-26 LAB — URINALYSIS, COMPLETE
BILIRUBIN UA: NEGATIVE
Glucose, UA: NEGATIVE
Ketones, UA: NEGATIVE
Leukocytes, UA: NEGATIVE
Nitrite, UA: NEGATIVE
PH UA: 5.5 (ref 5.0–7.5)
RBC UA: NEGATIVE
Specific Gravity, UA: 1.02 (ref 1.005–1.030)
UUROB: 0.2 mg/dL (ref 0.2–1.0)

## 2017-05-26 NOTE — Progress Notes (Signed)
05/26/2017 1:42 PM   Jessica PutnamGail T Cole 12-15-1950 [id number]  Referring provider: Lynnea FerrierKlein, Bert J III, MD [address] [facility] [place], [place] [id number]  Chief Complaint  Patient presents with  . Follow-up    KUB results    HPI: 67 year old female status post shockwave lithotripsy for a 6 mm left distal ureteral calculus on 05/14/2017.  She passed one small fragment and continues to have intermittent left lower quadrant abdominal pain.  She has no voiding complaints.  A KUB performed today was reviewed and the previously seen calculus is not identified.   PMH: Past Medical History:  Diagnosis Date  . Anxiety   . Colon polyps   . Degenerative arthritis   . Depression   . Diabetes mellitus without complication (HCC)    Type II  . Environmental allergies   . Family history of adverse reaction to anesthesia    sister- PONV  . Fatty liver   . Glaucoma   . Headache(784.0)    HX  MIGRAINES  . History of bronchitis   . History of kidney stones   . History of pneumonia   . Hypertension   . Hypothyroidism   . Obesity   . OSA on CPAP   . Plantar fasciitis    right  . PONV (postoperative nausea and vomiting)   . Renal calculi   . Renal calculi   . RLS (restless legs syndrome) 08/23/2014    Surgical History: Past Surgical History:  Procedure Laterality Date  . ABDOMINAL HYSTERECTOMY     partial  . APPENDECTOMY    . Arthroscopic surgery, knee Left   . BREAST BIOPSY Right    several  . BREAST BIOPSY Right 03/11/2017   u/s bx neg  . BUNIONECTOMY     LEFT   . COLONOSCOPY W/ POLYPECTOMY    . EXTRACORPOREAL SHOCK WAVE LITHOTRIPSY Left 05/14/2017   Procedure: EXTRACORPOREAL SHOCK WAVE LITHOTRIPSY (ESWL);  Surgeon: Riki AltesStoioff, Deliyah Muckle C, MD;  Location: [facility] ORS;  Service: Urology;  Laterality: Left;  . FOOT SURGERY     RIGHT    . KNEE ARTHROSCOPY W/ MENISCAL REPAIR Left   . LASIK    . MAXIMUM ACCESS (MAS)POSTERIOR LUMBAR INTERBODY FUSION (PLIF) 1 LEVEL N/A  04/25/2016   Procedure: LUMBAR FOUR-FIVE  MAXIMUM ACCESS (MAS) POSTERIOR LUMBAR INTERBODY FUSION (PLIF) with extension of instrumentation LUMBAR TWO-FIVE;  Surgeon: Tia Alertavid S Jones, MD;  [facility];  Service: Neurosurgery;  Laterality: N/A;  . MAXIMUM ACCESS (MAS)POSTERIOR LUMBAR INTERBODY FUSION (PLIF) 2 LEVEL N/A 12/13/2015   Procedure: Lumbar two-three - Lumbar three-four MAXIMUM ACCESS (MAS) POSTERIOR LUMBAR INTERBODY FUSION (PLIF)  ;  Surgeon: Tia Alertavid S Jones, MD;  [facility];  Service: Neurosurgery;  Laterality: N/A;  . SHOULDER SURGERY     RIGHT   . TONSILLECTOMY      Home Medications:  Allergies as of 05/26/2017      Reactions   Diazepam    hallucinations   Dexamethasone Other (See Comments)   During a tapered dose, once.  Was shaky (side effect, not allergy).      Medication List        Accurate as of 05/26/17  1:42 PM. Always use your most recent med list.          ALLER-EASE 180 MG tablet Generic drug:  fexofenadine Take 180 mg by mouth daily.   azelastine 0.1 % nasal spray Commonly known as:  ASTELIN Place 1 spray into both nostrils 2 (two)  times daily.   BREO ELLIPTA 200-25 MCG/INH Aepb Generic drug:  fluticasone furoate-vilanterol Take 1 puff daily by mouth.   busPIRone 7.5 MG tablet Commonly known as:  BUSPAR Take 7.5 mg 2 (two) times daily as needed by mouth (chest pain).   Co Q 10 100 MG Caps Take 200 mg by mouth daily.   diclofenac sodium 1 % Gel Commonly known as:  VOLTAREN Apply 4 g topically 2 (two) times daily.   enalapril 10 MG tablet Commonly known as:  VASOTEC Take 10 mg by mouth daily.   estradiol 0.05 MG/24HR patch Commonly known as:  VIVELLE-DOT Place 1 patch onto the skin 2 (two) times a week.   FISH OIL ULTRA 1000 MG Caps Take 1,000 mg by mouth 2 (two) times daily.   fluticasone 50 MCG/ACT nasal spray Commonly known as:  FLONASE Place 2 sprays into both nostrils 2 (two) times daily.   furosemide 20 MG  tablet Commonly known as:  LASIX Take 20 mg by mouth daily as needed.   gabapentin 800 MG tablet Commonly known as:  NEURONTIN TAKE ONE (1) TABLET BY MOUTH TWO (2) TIMES DAILY   HYDROcodone-acetaminophen 5-325 MG tablet Commonly known as:  NORCO/VICODIN Take 1 tablet by mouth every 6 (six) hours as needed for moderate pain.   latanoprost 0.005 % ophthalmic solution Commonly known as:  XALATAN Place 1 drop into both eyes at bedtime.   levothyroxine 112 MCG tablet Commonly known as:  SYNTHROID, LEVOTHROID Take 112 mcg by mouth daily before breakfast.   losartan 50 MG tablet Commonly known as:  COZAAR Take 50 mg by mouth every evening.   metFORMIN 500 MG tablet Commonly known as:  GLUCOPHAGE Take 500 mg by mouth 2 (two) times daily with a meal.   metoprolol succinate 25 MG 24 hr tablet Commonly known as:  TOPROL-XL TAKE ONE TABLET BY MOUTH EVERY DAY   montelukast 10 MG tablet Commonly known as:  SINGULAIR Take 10 mg at bedtime by mouth.   MULTIVITAMIN ADULT PO Take by mouth daily.   ondansetron 4 MG disintegrating tablet Commonly known as:  ZOFRAN ODT Take 1 tablet (4 mg total) by mouth every 8 (eight) hours as needed for nausea or vomiting.   oxybutynin 5 MG tablet Commonly known as:  DITROPAN Take 5 mg by mouth 2 (two) times daily.   potassium chloride 10 MEQ tablet Commonly known as:  K-DUR,KLOR-CON Take 10 mEq by mouth daily as needed.   Red Yeast Rice 600 MG Caps Take 1,200 mg by mouth 2 (two) times daily.   rOPINIRole 4 MG tablet Commonly known as:  REQUIP Take 1 tablet (4 mg total) 2 (two) times daily by mouth.   tamsulosin 0.4 MG Caps capsule Commonly known as:  FLOMAX Take 1 capsule (0.4 mg total) by mouth daily after breakfast.   tiZANidine 4 MG tablet Commonly known as:  ZANAFLEX Take 4 mg by mouth 3 (three) times daily as needed for muscle spasms.   topiramate 100 MG tablet Commonly known as:  TOPAMAX Take 1 tablet (100 mg total) at bedtime  by mouth.   traMADol 50 MG tablet Commonly known as:  ULTRAM Take 1 tablet (50 mg total) by mouth every 6 (six) hours as needed. Must last 28 days       Allergies:  Allergies  Allergen Reactions  . Diazepam     hallucinations  . Dexamethasone Other (See Comments)    During a tapered dose, once.  Was shaky (side effect,  not allergy).    Family History: Family History  Problem Relation Age of Onset  . Lung cancer Mother   . Congestive Heart Failure Father   . Depression Sister   . Rheum arthritis Sister   . Headache Maternal Grandfather   . Migraines Maternal Grandfather   . Headache Maternal Uncle   . Diabetes Unknown   . Heart disease Unknown   . Hypertension Unknown   . Breast cancer Neg Hx   . Bladder Cancer Neg Hx   . Kidney cancer Neg Hx     Social History:  reports that  has never smoked. she has never used smokeless tobacco. She reports that she does not drink alcohol or use drugs.  ROS: UROLOGY Frequent Urination?: No Hard to postpone urination?: No Burning/pain with urination?: No Get up at night to urinate?: No Leakage of urine?: No Urine stream starts and stops?: No Trouble starting stream?: No Do you have to strain to urinate?: No Blood in urine?: No Urinary tract infection?: No Sexually transmitted disease?: No Injury to kidneys or bladder?: No Painful intercourse?: No Weak stream?: No Currently pregnant?: No Vaginal bleeding?: No Last menstrual period?: n  Gastrointestinal Nausea?: No Vomiting?: No Indigestion/heartburn?: No Diarrhea?: No Constipation?: Yes  Constitutional Fever: No Night sweats?: No Weight loss?: No Fatigue?: No  Skin Skin rash/lesions?: No Itching?: No  Eyes Blurred vision?: No Double vision?: No  Ears/Nose/Throat Sore throat?: No Sinus problems?: No  Hematologic/Lymphatic Swollen glands?: No Easy bruising?: Yes  Cardiovascular Leg swelling?: No Chest pain?: Yes  Respiratory Cough?:  Yes Shortness of breath?: No  Endocrine Excessive thirst?: No  Musculoskeletal Back pain?: No Joint pain?: Yes  Neurological Headaches?: Yes Dizziness?: No  Psychologic Depression?: No Anxiety?: Yes  Physical Exam: BP (!) 144/84 (BP Location: Left Arm, Patient Position: Sitting, Cuff Size: Normal)   Pulse 71   Ht 5\' 1"  (1.549 m)   Wt 193 lb (87.5 kg)   BMI 36.47 kg/m   Constitutional:  Alert and oriented, No acute distress. HEENT: [place] AT, moist mucus membranes.  Trachea midline, no masses. Cardiovascular: No clubbing, cyanosis, or edema. Respiratory: Normal respiratory effort, no increased work of breathing. GI: Abdomen is soft, nontender, nondistended, no abdominal masses GU: No CVA tenderness. Skin: No rashes, bruises or suspicious lesions. Lymph: No cervical or inguinal adenopathy. Neurologic: Grossly intact, no focal deficits, moving all 4 extremities. Psychiatric: Normal mood and affect.  Laboratory Data: Lab Results  Component Value Date   WBC 9.6 05/09/2017   HGB 14.0 05/09/2017   HCT 41.2 05/09/2017   MCV 92.1 05/09/2017   PLT 176 05/09/2017    Lab Results  Component Value Date   CREATININE 1.14 (H) 05/09/2017    Lab Results  Component Value Date   HGBA1C 6.6 (H) 04/18/2016    Urinalysis Lab Results  Component Value Date   SPECGRAV 1.020 05/26/2017   PHUR 5.5 05/26/2017   COLORU Yellow 05/26/2017   APPEARANCEUR Clear 05/26/2017   LEUKOCYTESUR Negative 05/26/2017   PROTEINUR Trace (A) 05/26/2017   GLUCOSEU Negative 05/26/2017   KETONESU Negative 05/26/2017   RBCU Negative 05/26/2017   BILIRUBINUR Negative 05/26/2017   UUROB 0.2 05/26/2017   NITRITE Negative 05/26/2017    Lab Results  Component Value Date   LABMICR See below: 05/26/2017   WBCUA 0-5 05/26/2017   RBCUA None seen 05/26/2017   LABEPIT 0-10 05/26/2017   MUCUS Present (A) 05/26/2017   BACTERIA Few (A) 05/26/2017    Pertinent Imaging:  Results for  orders placed during  the hospital encounter of 05/26/17  Abdomen 1 view (KUB)   Narrative CLINICAL DATA:  Lithotripsy on the left, no stones have passed according to the patient, still some left flank pain  EXAM: ABDOMEN - 1 VIEW  COMPARISON:  KUB of 05/14/2017 and CT abdomen pelvis of 05/09/2016  FINDINGS: By plain film, no definite renal or ureteral calculi are seen. The calculus questioned previously in the low left pelvis is no longer seen. The bowel gas pattern is nonspecific. Hardware for fusion of the lower lumbar spine is noted.  IMPRESSION: No definite renal or ureteral calculi are identified.   Electronically Signed   By: Dwyane Dee M.D.   On: 05/26/2017 11:58      Assessment & Plan:   Status post shockwave lithotripsy with persistent pain.  The calculus is not definitely seen on KUB today.  Recommend scheduling a follow-up CT for further evaluation.  1. Left ureteral stone   - Urinalysis, Complete  2. Left lower quadrant pain   - CT RENAL STONE STUDY; Future     Riki Altes, MD  [facility] [address], [address] [place], [place] [address] [phone]

## 2017-05-27 ENCOUNTER — Encounter: Payer: Self-pay | Admitting: Urology

## 2017-05-29 ENCOUNTER — Ambulatory Visit: Payer: Medicare HMO

## 2017-05-29 ENCOUNTER — Ambulatory Visit: Payer: Medicare HMO | Admitting: Urology

## 2017-05-29 ENCOUNTER — Telehealth: Payer: Self-pay | Admitting: Adult Health

## 2017-05-29 MED ORDER — ERENUMAB-AOOE 70 MG/ML ~~LOC~~ SOAJ: 140.0000 mg | SUBCUTANEOUS | 5 refills | Status: DC

## 2017-05-29 NOTE — Telephone Encounter (Signed)
Pt is wanting to Dr. Anne HahnWillis preferably about her medications  traMADol (ULTRAM) 50 MG tablet and  topiramate (TOPAMAX) 100 MG tablet. Pt said they can cause kidney stones and she is just getting over passing one. Please call to discuss

## 2017-05-29 NOTE — Telephone Encounter (Signed)
I called the patient.  The patient has had recurring renal calculi for several decades.  She just recently had lithotripsy for a kidney stone.  It is probably unlikely that the Topamax is the source of her kidney stones, but it could potentially be exacerbating this.  We will taper her down off the Topamax taking 50 mg at night for 2 weeks and then get off the medication.  Patient continues to have headaches virtually daily.  We will start Aimovig to see if this helps her.

## 2017-06-02 ENCOUNTER — Telehealth: Payer: Self-pay | Admitting: *Deleted

## 2017-06-02 NOTE — Telephone Encounter (Signed)
Per cover my meds, Aimovig approved.

## 2017-06-02 NOTE — Telephone Encounter (Signed)
[facility] [place], re: [place] Aimovig. Aimovig PA started on CMM.

## 2017-06-02 NOTE — Telephone Encounter (Addendum)
[facility], [place], pharmacist and advised her Aimovig was approved. She ran med through and stated it went through. Patient will have $60 co pay. She stated she would get it ready for patient.

## 2017-06-05 ENCOUNTER — Ambulatory Visit
Admission: RE | Admit: 2017-06-05 | Discharge: 2017-06-05 | Disposition: A | Discharge status: Home / Self Care | Payer: Medicare HMO | Source: Ambulatory Visit | Attending: Urology | Admitting: Urology

## 2017-06-05 ENCOUNTER — Telehealth: Payer: Self-pay

## 2017-06-05 DIAGNOSIS — R1032 Left lower quadrant pain: Secondary | ICD-10-CM | POA: Insufficient documentation

## 2017-06-05 DIAGNOSIS — Z87442 Personal history of urinary calculi: Secondary | ICD-10-CM | POA: Insufficient documentation

## 2017-06-05 NOTE — Telephone Encounter (Signed)
Spoke with pt in reference to CT results and pain. Pt voiced understanding.

## 2017-06-05 NOTE — Telephone Encounter (Signed)
-----   Message from Riki AltesScott C Stoioff, MD sent at 06/05/2017  1:11 PM EST ----- CT scan showed no residual stone fragments or evidence of kidney blockage.  If she is still having pain would recommend she see her primary provider for evaluation of other organ systems.

## 2017-06-10 ENCOUNTER — Other Ambulatory Visit: Payer: Self-pay | Admitting: Urology

## 2017-06-12 ENCOUNTER — Telehealth: Payer: Self-pay | Admitting: Adult Health

## 2017-06-12 NOTE — Telephone Encounter (Signed)
I called the patient.  The patient apparently was taking the Topamax as needed for the headache, indicated that this is a daily medication to be taken as a preventative.  It is okay to take the Topamax while on Aimovig, no need to taper off of the Topamax.  Apparently the way she was taking the Topamax she would be getting no benefit from the medication.

## 2017-06-12 NOTE — Telephone Encounter (Signed)
Pt is asking for a call back,she states she can not recall if Dr Anne HahnWillis advised her to wait a few days after finishing topiramate (TOPAMAX) 100 MG tablet before she is to start Amovig, please call

## 2017-06-15 ENCOUNTER — Telehealth: Payer: Self-pay

## 2017-06-15 NOTE — Telephone Encounter (Signed)
Patient notified and [facility] order was faxed, instructions discussed and reminder mailed

## 2017-06-15 NOTE — Telephone Encounter (Signed)
-----   Message from Riki AltesScott C Stoioff, MD sent at 06/12/2017  1:19 PM EST ----- Jessica BrasStone analysis was predominantly calcium oxalate monohydrate.  If she has not had a prior metabolic evaluation would recommend proceeding with one through [facility]

## 2017-06-24 ENCOUNTER — Other Ambulatory Visit: Payer: Medicare HMO

## 2017-06-26 IMAGING — RF DG LUMBAR SPINE 2-3V
1 series · 3 of 3 positions shown · non-contrast
Comparison: 10/31/2015 lumbar spine CT

CLINICAL DATA: PLIF L2-L4

EXAM:
DG C-ARM 61-120 MIN; LUMBAR SPINE - 2-3 VIEW

[Series 1: run · 3 of 3 slices shown]
[im 1/3]
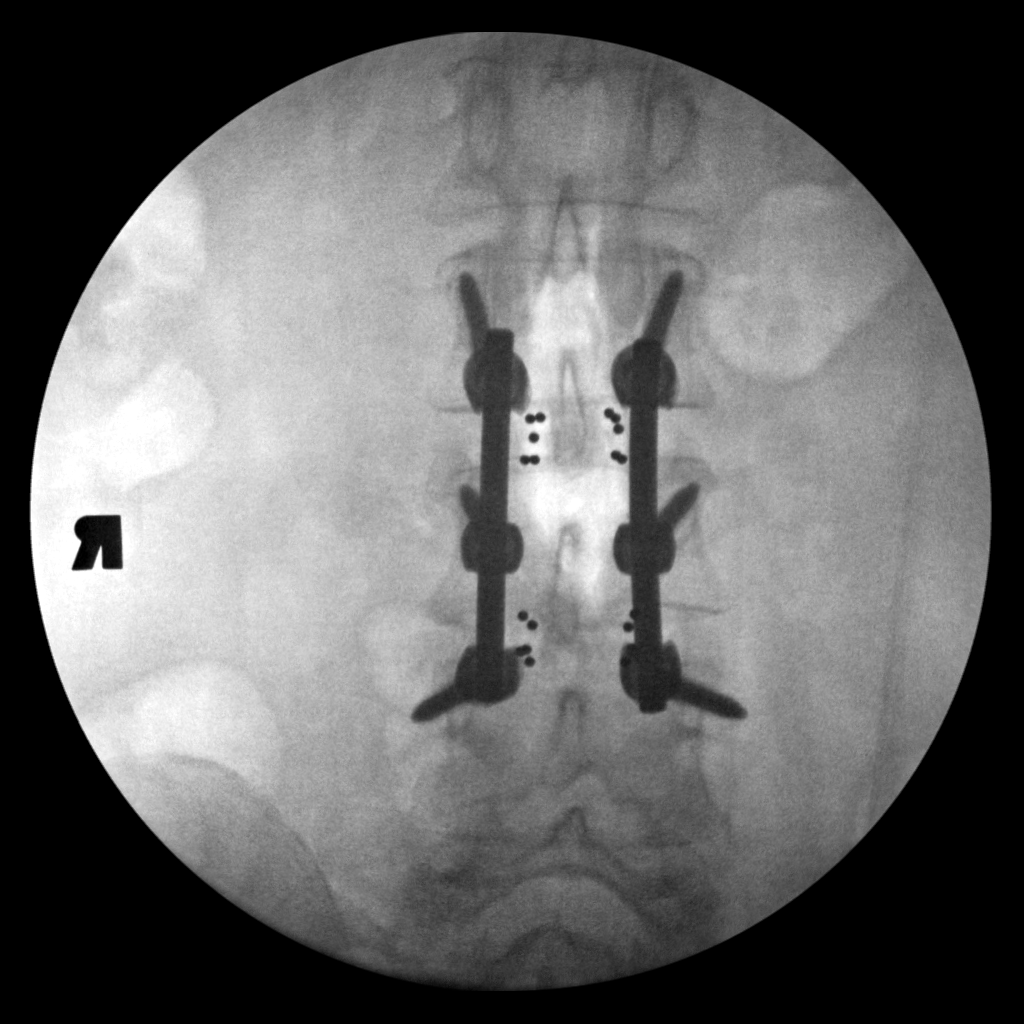
[im 2/3]
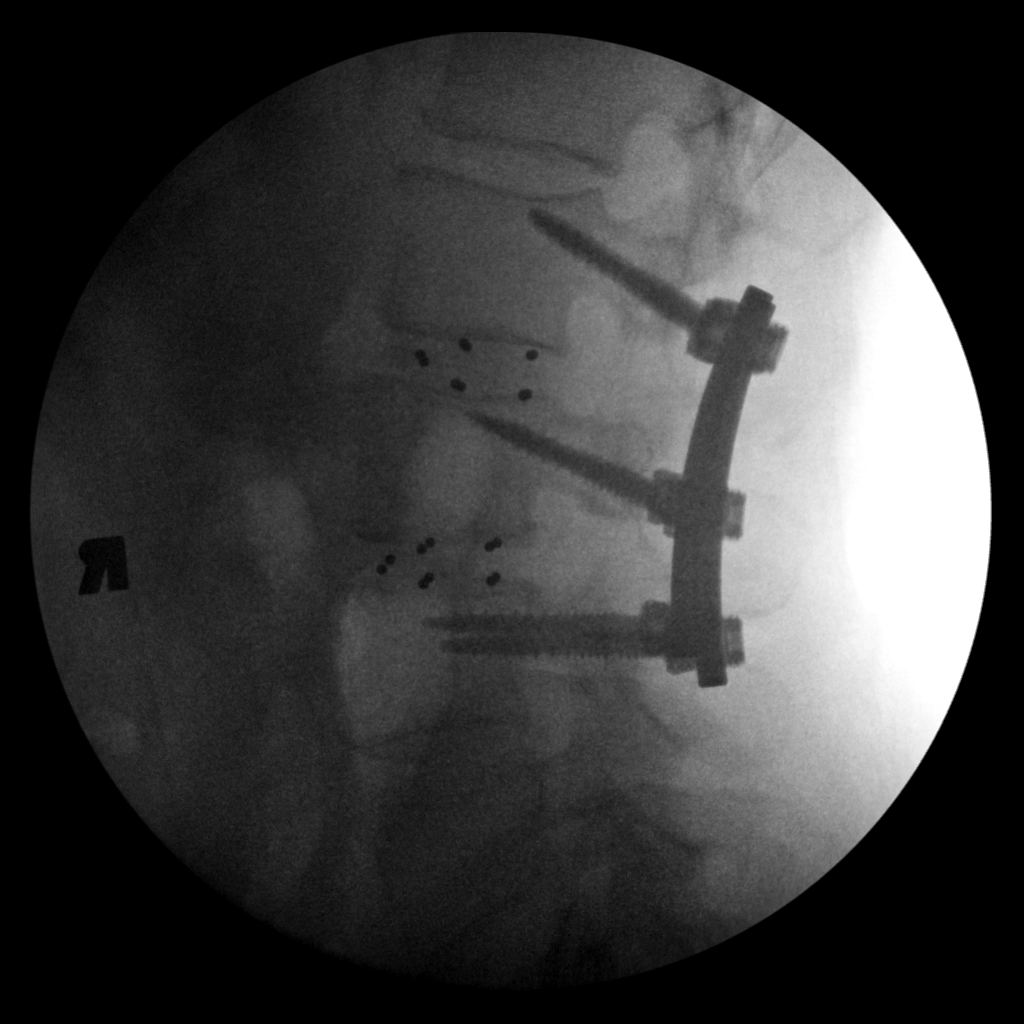
[im 3/3]
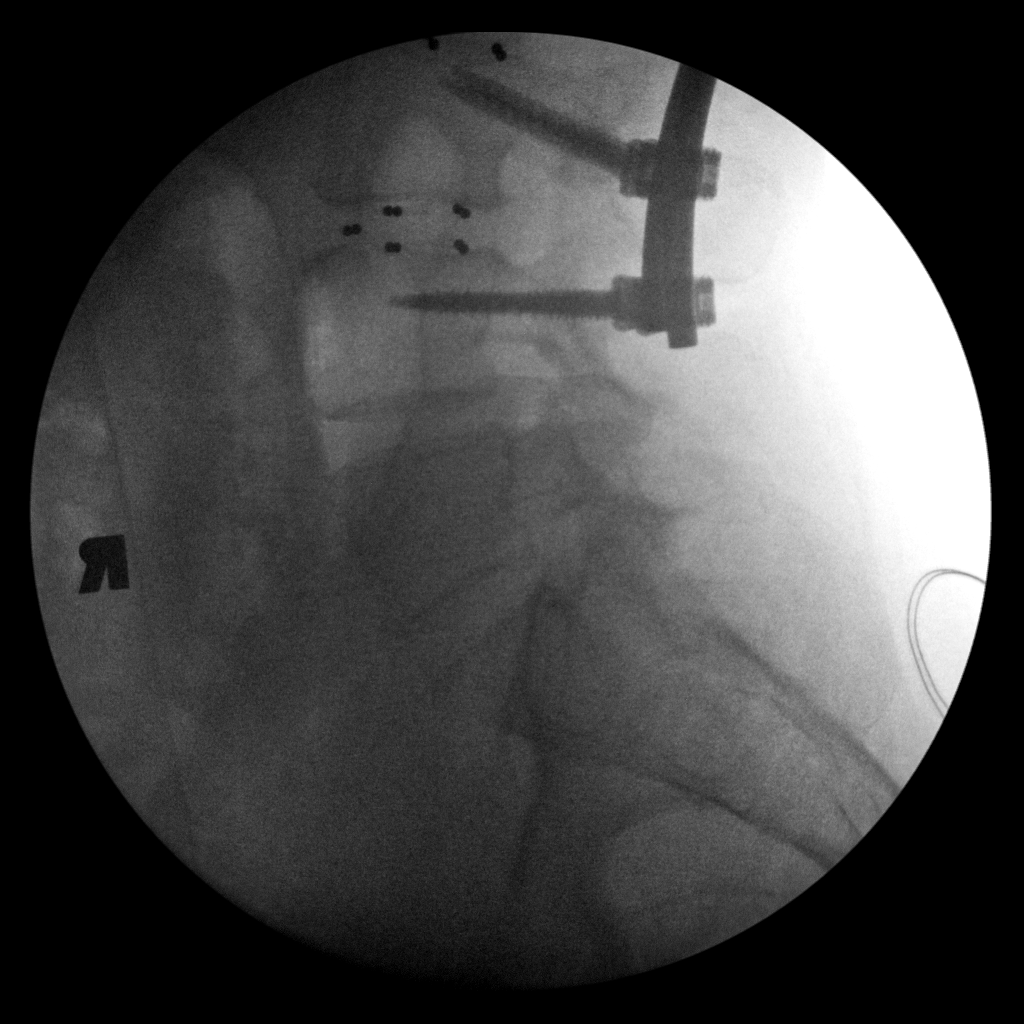

[3 of 3 positions shown; findings below may reference images not displayed]

FINDINGS: Fluoroscopy time 1 minutes 49 seconds. Three spot fluoroscopic
nondiagnostic intraoperative radiographs demonstrate postsurgical
changes from bilateral posterior lumbar spine fusion from L2-L4.
IMPRESSION: Intraoperative fluoroscopic guidance for PLIF L2-L4.

## 2017-06-30 ENCOUNTER — Telehealth: Payer: Self-pay | Admitting: Adult Health

## 2017-06-30 MED ORDER — TOPIRAMATE 100 MG PO TABS: 100.0000 mg | ORAL_TABLET | Freq: Every day | ORAL | 3 refills | Status: DC

## 2017-06-30 MED ORDER — ROPINIROLE HCL 4 MG PO TABS: 4.0000 mg | ORAL_TABLET | Freq: Two times a day (BID) | ORAL | 3 refills | Status: DC

## 2017-06-30 NOTE — Telephone Encounter (Signed)
Refill requip and topamax for 90 day supply and the Aimovig (she has refills already on this drug).

## 2017-06-30 NOTE — Telephone Encounter (Signed)
Patient requesting new Rx's for rOPINIRole (REQUIP) 4 MG tablet #90 and topiramate (TOPAMAX) 100 MG tablet #90 called to [facility] [address] [place]. Patient has changed pharmacies.

## 2017-06-30 NOTE — Telephone Encounter (Signed)
Pt said Aimovig is working well. She would like RX sent to [facility]

## 2017-06-30 NOTE — Addendum Note (Signed)
Addended by: Guy BeginYOUNG, SANDRA S on: 06/30/2017 03:13 PM   Modules accepted: Orders

## 2017-07-08 ENCOUNTER — Other Ambulatory Visit: Payer: Self-pay | Admitting: Neurology

## 2017-07-09 NOTE — Telephone Encounter (Signed)
Spoke to pt and relayed that she can use once injection monthly.  She was happy with this and will let us know how she does.

## 2017-07-09 NOTE — Telephone Encounter (Signed)
Pt called stating that she thought  her 1st month would be two injections of Aimovig and then every month after she would have 1 injection, pt is a bit confused when she received two more injections please call to discuss.

## 2017-07-09 NOTE — Telephone Encounter (Signed)
Ok to do 1 injection

## 2017-07-09 NOTE — Telephone Encounter (Signed)
Spoke with pt and she cannot do 2 injections each month.  She stated her husband gave her the injection in her thigh, and she said is hurst really bad.  I told her that using her stomach in the fatty tissue may be a better sight, but she still cannot do 2 injections.   She would like to do just one right now.  It seemed to help her migraines.  Please advise for ok for one injection a month.

## 2017-07-29 ENCOUNTER — Other Ambulatory Visit: Payer: Self-pay | Admitting: Urology

## 2017-07-29 ENCOUNTER — Encounter: Payer: Self-pay | Admitting: Urology

## 2017-07-29 ENCOUNTER — Ambulatory Visit: Payer: Medicare HMO | Admitting: Urology

## 2017-07-29 VITALS — BP 130/75 | HR 64 | Resp 16 | Ht 61.0 in | Wt 192.8 lb

## 2017-07-29 DIAGNOSIS — Z87442 Personal history of urinary calculi: Secondary | ICD-10-CM

## 2017-07-29 NOTE — Progress Notes (Signed)
07/29/2017 8:34 AM   Jessica PutnamGail T Cole 16-Apr-1951 [id number]  Referring provider: Lynnea FerrierKlein, Bert J III, MD [address] [facility] [place], [place] [id number]  Chief complaint: Follow-up  HPI: 67 year old female who underwent shockwave lithotripsy of a left distal ureteral calculus in January 2019.  Her CT showed no other calculi.  She has a recurrent stone former and presents today for review of her 24-hour urine results.  Her urine volume was suboptimal at 1.77 L.  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  Her medication list does include potassium chloride however she does not know if she is taking this medication.  She has no complaints today.  Denies flank, abdominal or pelvic pain.   PMH: Past Medical History:  Diagnosis Date  . Anxiety   . Colon polyps   . Degenerative arthritis   . Depression   . Diabetes mellitus without complication (HCC)    Type II  . Environmental allergies   . Family history of adverse reaction to anesthesia    sister- PONV  . Fatty liver   . Glaucoma   . Headache(784.0)    HX  MIGRAINES  . History of bronchitis   . History of kidney stones   . History of pneumonia   . Hypertension   . Hypothyroidism   . Obesity   . OSA on CPAP   . Plantar fasciitis    right  . PONV (postoperative nausea and vomiting)   . Renal calculi   . Renal calculi   . RLS (restless legs syndrome) 08/23/2014    Surgical History: Past Surgical History:  Procedure Laterality Date  . ABDOMINAL HYSTERECTOMY     partial  . APPENDECTOMY    . Arthroscopic surgery, knee Left   . BREAST BIOPSY Right    several  . BREAST BIOPSY Right 03/11/2017   u/s bx neg  . BUNIONECTOMY     LEFT   . COLONOSCOPY W/ POLYPECTOMY    . EXTRACORPOREAL SHOCK WAVE LITHOTRIPSY Left 05/14/2017   Procedure: EXTRACORPOREAL SHOCK WAVE LITHOTRIPSY (ESWL);  Surgeon: Riki AltesStoioff, Scott C, MD;  Location: [facility] ORS;  Service:  Urology;  Laterality: Left;  . FOOT SURGERY     RIGHT    . KNEE ARTHROSCOPY W/ MENISCAL REPAIR Left   . LASIK    . MAXIMUM ACCESS (MAS)POSTERIOR LUMBAR INTERBODY FUSION (PLIF) 1 LEVEL N/A 04/25/2016   Procedure: LUMBAR FOUR-FIVE  MAXIMUM ACCESS (MAS) POSTERIOR LUMBAR INTERBODY FUSION (PLIF) with extension of instrumentation LUMBAR TWO-FIVE;  Surgeon: Tia Alertavid S Jones, MD;  [facility];  Service: Neurosurgery;  Laterality: N/A;  . MAXIMUM ACCESS (MAS)POSTERIOR LUMBAR INTERBODY FUSION (PLIF) 2 LEVEL N/A 12/13/2015   Procedure: Lumbar two-three - Lumbar three-four MAXIMUM ACCESS (MAS) POSTERIOR LUMBAR INTERBODY FUSION (PLIF)  ;  Surgeon: Tia Alertavid S Jones, MD;  [facility];  Service: Neurosurgery;  Laterality: N/A;  . SHOULDER SURGERY     RIGHT   . TONSILLECTOMY      Home Medications:  Allergies as of 07/29/2017      Reactions   Diazepam    hallucinations   Dexamethasone Other (See Comments)   During a tapered dose, once.  Was shaky (side effect, not allergy).      Medication List        Accurate as of 07/29/17  8:34 AM. Always use your most recent med list.  AIMOVIG 140 DOSE 70 MG/ML Soaj Generic drug:  Erenumab-aooe INJECT 140 MG INTO THE SKIN EVERY 30 DAYS   ALLER-EASE 180 MG tablet Generic drug:  fexofenadine Take 180 mg by mouth daily.   azelastine 0.1 % nasal spray Commonly known as:  ASTELIN Place 1 spray into both nostrils 2 (two) times daily.   BREO ELLIPTA 200-25 MCG/INH Aepb Generic drug:  fluticasone furoate-vilanterol Take 1 puff daily by mouth.   busPIRone 7.5 MG tablet Commonly known as:  BUSPAR Take 7.5 mg 2 (two) times daily as needed by mouth (chest pain).   Co Q 10 100 MG Caps Take 200 mg by mouth daily.   diclofenac sodium 1 % Gel Commonly known as:  VOLTAREN Apply 4 g topically 2 (two) times daily.   enalapril 10 MG tablet Commonly known as:  VASOTEC Take 10 mg by mouth daily.   estradiol 0.05 MG/24HR patch Commonly known  as:  VIVELLE-DOT Place 1 patch onto the skin 2 (two) times a week.   FISH OIL ULTRA 1000 MG Caps Take 1,000 mg by mouth 2 (two) times daily.   fluticasone 50 MCG/ACT nasal spray Commonly known as:  FLONASE Place 2 sprays into both nostrils 2 (two) times daily.   furosemide 20 MG tablet Commonly known as:  LASIX Take 20 mg by mouth daily as needed.   gabapentin 800 MG tablet Commonly known as:  NEURONTIN TAKE ONE (1) TABLET BY MOUTH TWO (2) TIMES DAILY   latanoprost 0.005 % ophthalmic solution Commonly known as:  XALATAN Place 1 drop into both eyes at bedtime.   levothyroxine 112 MCG tablet Commonly known as:  SYNTHROID, LEVOTHROID Take 112 mcg by mouth daily before breakfast.   losartan 50 MG tablet Commonly known as:  COZAAR Take 50 mg by mouth every evening.   metFORMIN 500 MG tablet Commonly known as:  GLUCOPHAGE Take 500 mg by mouth 2 (two) times daily with a meal.   metoprolol succinate 25 MG 24 hr tablet Commonly known as:  TOPROL-XL TAKE ONE TABLET BY MOUTH EVERY DAY   montelukast 10 MG tablet Commonly known as:  SINGULAIR Take 10 mg at bedtime by mouth.   MULTIVITAMIN ADULT PO Take by mouth daily.   oxybutynin 5 MG tablet Commonly known as:  DITROPAN Take 5 mg by mouth 2 (two) times daily.   potassium chloride 10 MEQ tablet Commonly known as:  K-DUR,KLOR-CON Take 10 mEq by mouth daily as needed.   Red Yeast Rice 600 MG Caps Take 1,200 mg by mouth 2 (two) times daily.   rOPINIRole 4 MG tablet Commonly known as:  REQUIP Take 1 tablet (4 mg total) by mouth 2 (two) times daily.   tiZANidine 4 MG tablet Commonly known as:  ZANAFLEX Take 4 mg by mouth 3 (three) times daily as needed for muscle spasms.   topiramate 100 MG tablet Commonly known as:  TOPAMAX Take 1 tablet (100 mg total) by mouth at bedtime.   traMADol 50 MG tablet Commonly known as:  ULTRAM Take 1 tablet (50 mg total) by mouth every 6 (six) hours as needed. Must last 28 days         Allergies:  Allergies  Allergen Reactions  . Diazepam     hallucinations  . Dexamethasone Other (See Comments)    During a tapered dose, once.  Was shaky (side effect, not allergy).    Family History: Family History  Problem Relation Age of Onset  . Lung cancer Mother   .  Congestive Heart Failure Father   . Depression Sister   . Rheum arthritis Sister   . Headache Maternal Grandfather   . Migraines Maternal Grandfather   . Headache Maternal Uncle   . Diabetes Unknown   . Heart disease Unknown   . Hypertension Unknown   . Breast cancer Neg Hx   . Bladder Cancer Neg Hx   . Kidney cancer Neg Hx     Social History:  reports that she has never smoked. She has never used smokeless tobacco. She reports that she does not drink alcohol or use drugs.  ROS: UROLOGY Frequent Urination?: No Hard to postpone urination?: No Burning/pain with urination?: No Get up at night to urinate?: No Leakage of urine?: No Urine stream starts and stops?: No Trouble starting stream?: No Do you have to strain to urinate?: No Blood in urine?: No Urinary tract infection?: No Sexually transmitted disease?: No Injury to kidneys or bladder?: No Painful intercourse?: No Weak stream?: No Currently pregnant?: No Vaginal bleeding?: No Last menstrual period?: n  Gastrointestinal Nausea?: No Vomiting?: No Indigestion/heartburn?: No Diarrhea?: No Constipation?: No  Constitutional Fever: No Night sweats?: No Weight loss?: No Fatigue?: No  Skin Skin rash/lesions?: No Itching?: No  Eyes Blurred vision?: No Double vision?: No  Ears/Nose/Throat Sore throat?: No Sinus problems?: No  Hematologic/Lymphatic Swollen glands?: No Easy bruising?: No  Cardiovascular Leg swelling?: No Chest pain?: No  Respiratory Cough?: No Shortness of breath?: No  Endocrine Excessive thirst?: No  Musculoskeletal Back pain?: No Joint pain?: No  Neurological Headaches?: No Dizziness?:  No  Psychologic Depression?: No Anxiety?: No  Physical Exam: BP 130/75   Pulse 64   Resp 16   Ht 5\' 1"  (1.549 m)   Wt 192 lb 12.8 oz (87.5 kg)   SpO2 98%   BMI 36.43 kg/m   Constitutional:  Alert and oriented, No acute distress. HEENT: [place] AT, moist mucus membranes.  Trachea midline, no masses. Cardiovascular: No clubbing, cyanosis, or edema. Respiratory: Normal respiratory effort, no increased work of breathing. GU: No CVA tenderness Skin: No rashes, bruises or suspicious lesions. Neurologic: Grossly intact, no focal deficits, moving all 4 extremities. Psychiatric: Normal mood and affect.   Assessment & Plan:   Recurrent stone former with low urine volume, hypercalciuria and hypocitraturia.  I discussed the use of thiazide diuretics for hypercalciuria.  She does not desire to add additional medications at this time.  She is on antihypertensive medication and states she has a follow-up with Dr. Graciela Husbands next month.  Discussed possibility of switching or adding a thiazide diuretic.  I also discussed potassium citrate for her hypocitraturia however will need to make sure she is not taking potassium chloride and if so this could be substituted.  I also discussed use of lemonade to raise urinary citrate levels.  Recommend a repeat 24 urine study in July/August.  Follow-up office visit with KUB at that time.  Greater than 50% of this 15-minute visit was spent counseling patient    Riki Altes, MD  [facility] [address], [address] [place], [place] [address] [phone]

## 2017-08-03 ENCOUNTER — Encounter: Payer: Self-pay | Admitting: Urology

## 2017-08-13 ENCOUNTER — Encounter: Payer: Self-pay | Admitting: *Deleted

## 2017-08-14 ENCOUNTER — Ambulatory Visit: Payer: Medicare HMO | Admitting: Anesthesiology

## 2017-08-14 ENCOUNTER — Ambulatory Visit
Admission: RE | Admit: 2017-08-14 | Discharge: 2017-08-14 | Disposition: A | Discharge status: Home / Self Care | Payer: Medicare HMO | Source: Ambulatory Visit | Attending: Gastroenterology | Admitting: Gastroenterology

## 2017-08-14 ENCOUNTER — Encounter: Payer: Self-pay | Admitting: *Deleted

## 2017-08-14 ENCOUNTER — Encounter
Admission: RE | Disposition: A | Discharge status: Home / Self Care | Payer: Self-pay | Source: Ambulatory Visit | Attending: Gastroenterology

## 2017-08-14 DIAGNOSIS — E669 Obesity, unspecified: Secondary | ICD-10-CM | POA: Insufficient documentation

## 2017-08-14 DIAGNOSIS — H409 Unspecified glaucoma: Secondary | ICD-10-CM | POA: Diagnosis not present

## 2017-08-14 DIAGNOSIS — Z6836 Body mass index (BMI) 36.0-36.9, adult: Secondary | ICD-10-CM | POA: Diagnosis not present

## 2017-08-14 DIAGNOSIS — K640 First degree hemorrhoids: Secondary | ICD-10-CM | POA: Diagnosis not present

## 2017-08-14 DIAGNOSIS — Z7951 Long term (current) use of inhaled steroids: Secondary | ICD-10-CM | POA: Diagnosis not present

## 2017-08-14 DIAGNOSIS — Z79890 Hormone replacement therapy (postmenopausal): Secondary | ICD-10-CM | POA: Diagnosis not present

## 2017-08-14 DIAGNOSIS — K635 Polyp of colon: Secondary | ICD-10-CM | POA: Diagnosis not present

## 2017-08-14 DIAGNOSIS — Z79899 Other long term (current) drug therapy: Secondary | ICD-10-CM | POA: Diagnosis not present

## 2017-08-14 DIAGNOSIS — Z86010 Personal history of colonic polyps: Secondary | ICD-10-CM | POA: Insufficient documentation

## 2017-08-14 DIAGNOSIS — G2581 Restless legs syndrome: Secondary | ICD-10-CM | POA: Insufficient documentation

## 2017-08-14 DIAGNOSIS — K5730 Diverticulosis of large intestine without perforation or abscess without bleeding: Secondary | ICD-10-CM | POA: Insufficient documentation

## 2017-08-14 DIAGNOSIS — F329 Major depressive disorder, single episode, unspecified: Secondary | ICD-10-CM | POA: Diagnosis not present

## 2017-08-14 DIAGNOSIS — I10 Essential (primary) hypertension: Secondary | ICD-10-CM | POA: Diagnosis not present

## 2017-08-14 DIAGNOSIS — G4733 Obstructive sleep apnea (adult) (pediatric): Secondary | ICD-10-CM | POA: Diagnosis not present

## 2017-08-14 DIAGNOSIS — E119 Type 2 diabetes mellitus without complications: Secondary | ICD-10-CM | POA: Diagnosis not present

## 2017-08-14 DIAGNOSIS — K621 Rectal polyp: Secondary | ICD-10-CM | POA: Insufficient documentation

## 2017-08-14 DIAGNOSIS — E039 Hypothyroidism, unspecified: Secondary | ICD-10-CM | POA: Insufficient documentation

## 2017-08-14 DIAGNOSIS — Z9989 Dependence on other enabling machines and devices: Secondary | ICD-10-CM | POA: Insufficient documentation

## 2017-08-14 DIAGNOSIS — G43909 Migraine, unspecified, not intractable, without status migrainosus: Secondary | ICD-10-CM | POA: Diagnosis not present

## 2017-08-14 DIAGNOSIS — Z1211 Encounter for screening for malignant neoplasm of colon: Secondary | ICD-10-CM | POA: Diagnosis not present

## 2017-08-14 DIAGNOSIS — Z7984 Long term (current) use of oral hypoglycemic drugs: Secondary | ICD-10-CM | POA: Diagnosis not present

## 2017-08-14 HISTORY — PX: COLONOSCOPY WITH PROPOFOL: SHX5780

## 2017-08-14 LAB — GLUCOSE, CAPILLARY: Glucose-Capillary: 114 mg/dL — ABNORMAL HIGH (ref 65–99)

## 2017-08-14 SURGERY — COLONOSCOPY WITH PROPOFOL
Anesthesia: General

## 2017-08-14 MED ORDER — LIDOCAINE HCL (PF) 1 % IJ SOLN
INTRAMUSCULAR | Status: AC
  Administered 2017-08-14: 1 mL
  Filled 2017-08-14: qty 2

## 2017-08-14 MED ORDER — PROPOFOL 10 MG/ML IV BOLUS
INTRAVENOUS | Status: DC | PRN
  Administered 2017-08-14: 100 mg via INTRAVENOUS

## 2017-08-14 MED ORDER — PROPOFOL 500 MG/50ML IV EMUL
INTRAVENOUS | Status: DC | PRN
  Administered 2017-08-14: 130 ug/kg/min via INTRAVENOUS

## 2017-08-14 MED ORDER — LIDOCAINE HCL (CARDIAC) 20 MG/ML IV SOLN
INTRAVENOUS | Status: DC | PRN
  Administered 2017-08-14: 50 mg via INTRAVENOUS

## 2017-08-14 MED ORDER — SODIUM CHLORIDE 0.9 % IV SOLN
INTRAVENOUS | Status: DC
  Administered 2017-08-14: 1000 mL via INTRAVENOUS

## 2017-08-14 MED ORDER — PROPOFOL 500 MG/50ML IV EMUL
INTRAVENOUS | Status: AC
Start: 2017-08-14 — End: ?
  Filled 2017-08-14: qty 50

## 2017-08-14 MED ORDER — LIDOCAINE HCL (PF) 2 % IJ SOLN
INTRAMUSCULAR | Status: AC
  Filled 2017-08-14: qty 10

## 2017-08-14 NOTE — Op Note (Signed)
[facility] Gastroenter[facility] Patient Name: Jessica RosGail Dragovich Procedure Date: 08/14/2017 8:58 AM MRN: [id number] Account #: [id number] Date of Birth: November 19, 1950 Admit Type: Outpatient Age: [age] [facility] 3 Gender: Female Note Status: Finalized Procedure:            Colonoscopy Indications:          Personal history of colonic polyps Providers:            Christena DeemMartin U. Skulskie, MD Referring MD:         Daniel NonesBert Klein, MD (Referring MD) Medicines:            Monitored Anesthesia Care Complications:        No immediate complications. Procedure:            Pre-Anesthesia Assessment:                       - ASA Grade Assessment: III - A patient with severe                        systemic disease.                       After obtaining informed consent, the colonoscope was                        passed under direct vision. Throughout the procedure,                        the patient's blood pressure, pulse, and oxygen                        saturations were monitored continuously. The                        Colonoscope was introduced through the anus and                        advanced to the the cecum, identified by appendiceal                        orifice and ileocecal valve. The colonoscopy was                        performed without difficulty. The patient tolerated the                        procedure well. The quality of the bowel preparation                        was fair. Findings:      Multiple medium-mouthed diverticula were found in the sigmoid colon and       descending colon.      A 3 mm polyp was found in the proximal ascending colon. The polyp was       sessile. The polyp was removed with a cold biopsy forceps. Resection and       retrieval were complete.      Two sessile polyps were found in the rectum. The polyps were less than 1       mm in size. These polyps were removed with a cold biopsy forceps.  Resection and retrieval were complete.  The digital rectal exam was normal.      Non-bleeding internal hemorrhoids were found during anoscopy. The       hemorrhoids were small and Grade I (internal hemorrhoids that do not       prolapse). Impression:           - Preparation of the colon was fair.                       - Diverticulosis in the sigmoid colon and in the                        descending colon.                       - One 3 mm polyp in the proximal ascending colon,                        removed with a cold biopsy forceps. Resected and                        retrieved.                       - Two less than 1 mm polyps in the rectum, removed with                        a cold biopsy forceps. Resected and retrieved.                       - Non-bleeding internal hemorrhoids. Recommendation:       - Telephone GI clinic for pathology results in 1 week. Procedure Code(s):    --- Professional ---                       [phone], Colonoscopy, flexible; with biopsy, single or                        multiple Diagnosis Code(s):    --- Professional ---                       K64.0, First degree hemorrhoids                       D12.2, Benign neoplasm of ascending colon                       K62.1, Rectal polyp                       Z86.010, Personal history of colonic polyps                       K57.30, Diverticulosis of large intestine without                        perforation or abscess without bleeding CPT copyright 2017 American Medical Association. All rights reserved. The codes documented in this report are preliminary and upon coder review may  be revised to meet current compliance requirements. Christena Deem, MD 08/14/2017 9:42:31 AM This report has been signed electronically. Number of Addenda: 0 Note Initiated On: 08/14/2017  8:58 AM Scope Withdrawal Time: 0 hours 11 minutes 31 seconds  Total Procedure Duration: 0 hours 23 minutes 19 seconds       [facility]

## 2017-08-14 NOTE — Anesthesia Postprocedure Evaluation (Signed)
Anesthesia Post Note  Patient: Jessica Cole  Procedure(s) Performed: COLONOSCOPY WITH PROPOFOL (N/A )  Patient location during evaluation: Endoscopy Anesthesia Type: General Level of consciousness: awake and alert Pain management: pain level controlled Vital Signs Assessment: post-procedure vital signs reviewed and stable Respiratory status: spontaneous breathing, nonlabored ventilation, respiratory function stable and patient connected to nasal cannula oxygen Cardiovascular status: blood pressure returned to baseline and stable Postop Assessment: no apparent nausea or vomiting Anesthetic complications: no     Last Vitals:  Vitals:   08/14/17 0950 08/14/17 1000  BP: 109/73 121/65  Pulse: 62 (!) 56  Resp: 12 (!) 26  Temp:    SpO2: 99% 98%    Last Pain:  Vitals:   08/14/17 0940  TempSrc: Tympanic  PainSc:                  [name]

## 2017-08-14 NOTE — Anesthesia Post-op Follow-up Note (Signed)
Anesthesia QCDR form completed.        

## 2017-08-14 NOTE — Transfer of Care (Signed)
Immediate Anesthesia Transfer of Care Note  Patient: Jessica PutnamGail T Mahoney  Procedure(s) Performed: COLONOSCOPY WITH PROPOFOL (N/A )  Patient Location: PACU and Endoscopy Unit  Anesthesia Type:General  Level of Consciousness: drowsy  Airway & Oxygen Therapy: Patient Spontanous Breathing and Patient connected to nasal cannula oxygen  Post-op Assessment: Report given to RN and Post -op Vital signs reviewed and stable  Post vital signs: Reviewed and stable  Last Vitals:  Vitals Value Taken Time  BP 91/59 08/14/2017  9:44 AM  Temp    Pulse 64 08/14/2017  9:44 AM  Resp 10 08/14/2017  9:44 AM  SpO2 99 % 08/14/2017  9:44 AM  Vitals shown include unvalidated device data.  Last Pain:  Vitals:   08/14/17 0804  TempSrc: Tympanic  PainSc: 0-No pain         Complications: No apparent anesthesia complications

## 2017-08-14 NOTE — Anesthesia Preprocedure Evaluation (Signed)
Anesthesia Evaluation  Patient identified by MRN, date of birth, ID band Patient awake    Reviewed: Allergy & Precautions, H&P , NPO status , Patient's Chart, lab work & pertinent test results  History of Anesthesia Complications (+) PONV, DIFFICULT IV STICK / SPECIAL LINE, Family history of anesthesia reaction and history of anesthetic complications  Airway Mallampati: III  TM Distance: <3 FB Neck ROM: limited    Dental  (+) Chipped   Pulmonary neg shortness of breath, sleep apnea and Continuous Positive Airway Pressure Ventilation ,           Cardiovascular Exercise Tolerance: Good hypertension, (-) angina(-) Past MI and (-) DOE      Neuro/Psych  Headaches, PSYCHIATRIC DISORDERS Anxiety Depression  Neuromuscular disease negative neurological ROS     GI/Hepatic negative GI ROS, Neg liver ROS,   Endo/Other  diabetes, Type 2Hypothyroidism   Renal/GU Renal disease  negative genitourinary   Musculoskeletal  (+) Arthritis ,   Abdominal   Peds  Hematology negative hematology ROS (+)   Anesthesia Other Findings Past Medical History: No date: Anxiety No date: Colon polyps No date: Degenerative arthritis No date: Depression No date: Diabetes mellitus without complication (HCC)     Comment:  Type II No date: Environmental allergies No date: Family history of adverse reaction to anesthesia     Comment:  sister- PONV No date: Fatty liver No date: Glaucoma No date: Headache(784.0)     Comment:  HX  MIGRAINES No date: History of bronchitis No date: History of kidney stones No date: History of pneumonia No date: Hypertension No date: Hypothyroidism No date: Obesity No date: OSA on CPAP No date: Plantar fasciitis     Comment:  right No date: PONV (postoperative nausea and vomiting) No date: Renal calculi No date: Renal calculi 08/23/2014: RLS (restless legs syndrome)  Past Surgical History: No date: ABDOMINAL  HYSTERECTOMY     Comment:  partial No date: APPENDECTOMY No date: Arthroscopic surgery, knee; Left No date: BREAST BIOPSY; Right     Comment:  several 03/11/2017: BREAST BIOPSY; Right     Comment:  u/s bx neg No date: BUNIONECTOMY     Comment:  LEFT  No date: COLONOSCOPY W/ POLYPECTOMY 05/14/2017: EXTRACORPOREAL SHOCK WAVE LITHOTRIPSY; Left     Comment:  Procedure: EXTRACORPOREAL SHOCK WAVE LITHOTRIPSY (ESWL);              Surgeon: Riki AltesStoioff, Scott C, MD;  Location: [facility] ORS;                Service: Urology;  Laterality: Left; No date: FOOT SURGERY     Comment:  RIGHT   No date: KNEE ARTHROSCOPY W/ MENISCAL REPAIR; Left No date: LASIK 04/25/2016: MAXIMUM ACCESS (MAS)POSTERIOR LUMBAR INTERBODY FUSION  (PLIF) 1 LEVEL; N/A     Comment:  Procedure: LUMBAR FOUR-FIVE  MAXIMUM ACCESS (MAS)               POSTERIOR LUMBAR INTERBODY FUSION (PLIF) with extension               of instrumentation LUMBAR TWO-FIVE;  Surgeon: Tia Alertavid S               Jones, MD;  [facility];  Service: Neurosurgery;                Laterality: N/A; 12/13/2015: MAXIMUM ACCESS (MAS)POSTERIOR LUMBAR INTERBODY FUSION  (PLIF) 2 LEVEL; N/A     Comment:  Procedure: Lumbar two-three - Lumbar three-four MAXIMUM  ACCESS (MAS) POSTERIOR LUMBAR INTERBODY FUSION (PLIF)  ;               Surgeon: Tia Alert, MD;  Location: [facility];                Service: Neurosurgery;  Laterality: N/A; No date: SHOULDER SURGERY     Comment:  RIGHT  No date: TONSILLECTOMY  BMI    Body Mass Index:  36.09 kg/m      Reproductive/Obstetrics negative OB ROS                             Anesthesia Physical Anesthesia Plan  ASA: III  Anesthesia Plan: General   Post-op Pain Management:    Induction: Intravenous  PONV Risk Score and Plan: Propofol infusion and TIVA  Airway Management Planned: Natural Airway and Nasal Cannula  Additional Equipment:   Intra-op Plan:   Post-operative Plan:    Informed Consent: I have reviewed the patients History and Physical, chart, labs and discussed the procedure including the risks, benefits and alternatives for the proposed anesthesia with the patient or authorized representative who has indicated his/her understanding and acceptance.   Dental Advisory Given  Plan Discussed with: Anesthesiologist, CRNA and Surgeon  Anesthesia Plan Comments: (Patient consented for risks of anesthesia including but not limited to:  - adverse reactions to medications - risk of intubation if required - damage to teeth, lips or other oral mucosa - sore throat or hoarseness - Damage to heart, brain, lungs or loss of life  Patient voiced understanding.)        Anesthesia Quick Evaluation

## 2017-08-14 NOTE — H&P (Signed)
Outpatient short stay form Pre-procedure 08/14/2017 9:02 AM Jessica Deem MD  Primary Physician: Dr. Daniel Nones  Reason for visit: Colonoscopy  History of present illness: Patient is a 67 year old female presenting today as above.  She has personal history of adenomatous colon polyps with her last procedure being done about 5 years ago.  She tolerated prep well.  She takes no aspirin or blood thinning agent.  She will occasionally take a 81 mg aspirin perhaps 3 times a week.    Current Facility-Administered Medications:  .  0.9 %  sodium chloride infusion, , Intravenous, Continuous, Jessica Deem, MD, Last Rate: 20 mL/hr at 08/14/17 0841, 1,000 mL at 08/14/17 0841  Medications Prior to Admission  Medication Sig Dispense Refill Last Dose  . levothyroxine (SYNTHROID, LEVOTHROID) 112 MCG tablet Take 112 mcg by mouth daily before breakfast.    08/14/2017 at Unknown time  . losartan (COZAAR) 50 MG tablet Take 50 mg by mouth every evening.    08/13/2017 at Unknown time  . metoprolol succinate (TOPROL-XL) 25 MG 24 hr tablet TAKE ONE TABLET BY MOUTH EVERY DAY 90 tablet 1 08/14/2017 at Unknown time  . AIMOVIG 140 DOSE 70 MG/ML SOAJ INJECT 140 MG INTO THE SKIN EVERY 30 DAYS 2 pen 3   . azelastine (ASTELIN) 0.1 % nasal spray Place 1 spray into both nostrils 2 (two) times daily.    Taking  . BREO ELLIPTA 200-25 MCG/INH AEPB Take 1 puff daily by mouth.   Taking  . busPIRone (BUSPAR) 7.5 MG tablet Take 7.5 mg 2 (two) times daily as needed by mouth (chest pain).   Taking  . diclofenac sodium (VOLTAREN) 1 % GEL Apply 4 g topically 2 (two) times daily. (Patient taking differently: Apply 4 g topically daily as needed (for pain). ) 100 g 1 Taking  . enalapril (VASOTEC) 10 MG tablet Take 10 mg by mouth daily.   Taking  . estradiol (VIVELLE-DOT) 0.05 MG/24HR patch Place 1 patch onto the skin 2 (two) times a week.    Taking  . fexofenadine (ALLER-EASE) 180 MG tablet Take 180 mg by mouth daily.   Taking  .  fluticasone (FLONASE) 50 MCG/ACT nasal spray Place 2 sprays into both nostrils 2 (two) times daily.    Taking  . furosemide (LASIX) 20 MG tablet Take 20 mg by mouth daily as needed.   Taking  . gabapentin (NEURONTIN) 800 MG tablet TAKE ONE (1) TABLET BY MOUTH TWO (2) TIMES DAILY 180 tablet 3 Taking  . latanoprost (XALATAN) 0.005 % ophthalmic solution Place 1 drop into both eyes at bedtime.    Taking  . metFORMIN (GLUCOPHAGE) 500 MG tablet Take 500 mg by mouth 2 (two) times daily with a meal.    Taking  . montelukast (SINGULAIR) 10 MG tablet Take 10 mg at bedtime by mouth.   Taking  . Multiple Vitamins-Minerals (MULTIVITAMIN ADULT PO) Take by mouth daily.   Taking  . oxybutynin (DITROPAN) 5 MG tablet Take 5 mg by mouth 2 (two) times daily.   Taking  . potassium chloride (K-DUR,KLOR-CON) 10 MEQ tablet Take 10 mEq by mouth daily as needed.   Taking  . Red Yeast Rice 600 MG CAPS Take 1,200 mg by mouth 2 (two) times daily.   Taking  . rOPINIRole (REQUIP) 4 MG tablet Take 1 tablet (4 mg total) by mouth 2 (two) times daily. 180 tablet 3   . tiZANidine (ZANAFLEX) 4 MG tablet Take 4 mg by mouth 3 (three) times daily  as needed for muscle spasms.   Taking  . topiramate (TOPAMAX) 100 MG tablet Take 1 tablet (100 mg total) by mouth at bedtime. 90 tablet 3   . traMADol (ULTRAM) 50 MG tablet Take 1 tablet (50 mg total) by mouth every 6 (six) hours as needed. Must last 28 days 50 tablet 1 Taking     Allergies  Allergen Reactions  . Diazepam     hallucinations  . Dexamethasone Other (See Comments)    During a tapered dose, once.  Was shaky (side effect, not allergy).     Past Medical History:  Diagnosis Date  . Anxiety   . Colon polyps   . Degenerative arthritis   . Depression   . Diabetes mellitus without complication (HCC)    Type II  . Environmental allergies   . Family history of adverse reaction to anesthesia    sister- PONV  . Fatty liver   . Glaucoma   . Headache(784.0)    HX  MIGRAINES   . History of bronchitis   . History of kidney stones   . History of pneumonia   . Hypertension   . Hypothyroidism   . Obesity   . OSA on CPAP   . Plantar fasciitis    right  . PONV (postoperative nausea and vomiting)   . Renal calculi   . Renal calculi   . RLS (restless legs syndrome) 08/23/2014    Review of systems:      Physical Exam    Heart and lungs: Regular rate and rhythm without rub or gallop, lungs are bilaterally clear.    HEENT: Normocephalic atraumatic eyes are anicteric    Other:    Pertinant exam for procedure: Soft nontender nondistended bowel sounds positive normoactive.    Planned proceedures: Colonoscopy and indicated procedures. I have discussed the risks benefits and complications of procedures to include not limited to bleeding, infection, perforation and the risk of sedation and the patient wishes to proceed.    Jessica DeemMartin U Sorrel Cassetta, MD Gastroenterology 08/14/2017  9:02 AM

## 2017-08-17 LAB — SURGICAL PATHOLOGY

## 2017-09-23 ENCOUNTER — Encounter: Payer: Self-pay | Admitting: Neurology

## 2017-09-23 ENCOUNTER — Ambulatory Visit: Payer: Medicare HMO | Admitting: Neurology

## 2017-09-23 VITALS — BP 180/89 | HR 62 | Ht 61.0 in | Wt 193.0 lb

## 2017-09-23 DIAGNOSIS — G2581 Restless legs syndrome: Secondary | ICD-10-CM

## 2017-09-23 DIAGNOSIS — G43019 Migraine without aura, intractable, without status migrainosus: Secondary | ICD-10-CM

## 2017-09-23 MED ORDER — METOPROLOL SUCCINATE ER 25 MG PO TB24: 25.0000 mg | ORAL_TABLET | Freq: Every day | ORAL | 3 refills | Status: DC

## 2017-09-23 MED ORDER — TRAMADOL HCL 50 MG PO TABS: 50.0000 mg | ORAL_TABLET | Freq: Four times a day (QID) | ORAL | 1 refills | Status: DC | PRN

## 2017-09-23 NOTE — Progress Notes (Signed)
Reason for visit: Migraine headache, restless leg syndrome  Jessica Cole is an 67 y.o. female  History of present illness:  Jessica Cole is a 67 year old right-handed white female with a history of diabetes, obesity, migraine headaches, and restless leg syndrome.  The patient indicates that she is now on Aimovig, this has been very helpful in reducing her headaches.  She is not sure whether or not she is taking Topamax currently.  Her headache diary indicates that she is only having 1 or 2 headaches a month now, and headaches are very mild usually.  The patient is not having significant issues with a restless leg syndrome, she is usually sleeping fairly well.  She has developed some pain in the buttocks, right greater than left, she will be seeing Dr. Yetta Barre from neurosurgery in the near future.  She has had an episode of what sounds like a panic attack recently, she has not had any recurrence.  She is on BuSpar.  She gets metoprolol, Aimovig, gabapentin, Requip, Topamax, and Ultram through this office.  Past Medical History:  Diagnosis Date  . Anxiety   . Colon polyps   . Degenerative arthritis   . Depression   . Diabetes mellitus without complication (HCC)    Type II  . Environmental allergies   . Family history of adverse reaction to anesthesia    sister- PONV  . Fatty liver   . Glaucoma   . Headache(784.0)    HX  MIGRAINES  . History of bronchitis   . History of kidney stones   . History of pneumonia   . Hypertension   . Hypothyroidism   . Obesity   . OSA on CPAP   . Plantar fasciitis    right  . PONV (postoperative nausea and vomiting)   . Renal calculi   . Renal calculi   . RLS (restless legs syndrome) 08/23/2014    Past Surgical History:  Procedure Laterality Date  . ABDOMINAL HYSTERECTOMY     partial  . APPENDECTOMY    . Arthroscopic surgery, knee Left   . BREAST BIOPSY Right    several  . BREAST BIOPSY Right 03/11/2017   u/s bx neg  . BUNIONECTOMY     LEFT   . COLONOSCOPY W/ POLYPECTOMY    . COLONOSCOPY WITH PROPOFOL N/A 08/14/2017   Procedure: COLONOSCOPY WITH PROPOFOL;  Surgeon: Christena Deem, MD;  Location: [facility] ENDOSCOPY;  Service: Endoscopy;  Laterality: N/A;  . EXTRACORPOREAL SHOCK WAVE LITHOTRIPSY Left 05/14/2017   Procedure: EXTRACORPOREAL SHOCK WAVE LITHOTRIPSY (ESWL);  Surgeon: Riki Altes, MD;  Location: [facility] ORS;  Service: Urology;  Laterality: Left;  . FOOT SURGERY     RIGHT    . KNEE ARTHROSCOPY W/ MENISCAL REPAIR Left   . LASIK    . MAXIMUM ACCESS (MAS)POSTERIOR LUMBAR INTERBODY FUSION (PLIF) 1 LEVEL N/A 04/25/2016   Procedure: LUMBAR FOUR-FIVE  MAXIMUM ACCESS (MAS) POSTERIOR LUMBAR INTERBODY FUSION (PLIF) with extension of instrumentation LUMBAR TWO-FIVE;  Surgeon: Tia Alert, MD;  Location: [facility] OR;  Service: Neurosurgery;  Laterality: N/A;  . MAXIMUM ACCESS (MAS)POSTERIOR LUMBAR INTERBODY FUSION (PLIF) 2 LEVEL N/A 12/13/2015   Procedure: Lumbar two-three - Lumbar three-four MAXIMUM ACCESS (MAS) POSTERIOR LUMBAR INTERBODY FUSION (PLIF)  ;  Surgeon: Tia Alert, MD;  Location: [facility] NEURO ORS;  Service: Neurosurgery;  Laterality: N/A;  . SHOULDER SURGERY     RIGHT   . TONSILLECTOMY      Family History  Problem Relation Age  of Onset  . Lung cancer Mother   . Congestive Heart Failure Father   . Depression Sister   . Rheum arthritis Sister   . Headache Maternal Grandfather   . Migraines Maternal Grandfather   . Headache Maternal Uncle   . Diabetes Unknown   . Heart disease Unknown   . Hypertension Unknown   . Breast cancer Neg Hx   . Bladder Cancer Neg Hx   . Kidney cancer Neg Hx     Social history:  reports that she has never smoked. She has never used smokeless tobacco. She reports that she does not drink alcohol or use drugs.    Allergies  Allergen Reactions  . Diazepam     hallucinations  . Dexamethasone Other (See Comments)    During a tapered dose, once.  Was shaky (side effect, not allergy).      Medications:  Prior to Admission medications   Medication Sig Start Date End Date Taking? Authorizing Provider  AIMOVIG 140 DOSE 70 MG/ML SOAJ INJECT 140 MG INTO THE SKIN EVERY 30 DAYS 07/08/17  Yes Butch Penny, NP  azelastine (ASTELIN) 0.1 % nasal spray Place 1 spray into both nostrils 2 (two) times daily.  11/23/15  Yes [provider]  BREO ELLIPTA 200-25 MCG/INH AEPB Take 1 puff daily by mouth. 03/05/17  Yes [provider]  busPIRone (BUSPAR) 7.5 MG tablet Take 7.5 mg 2 (two) times daily as needed by mouth (chest pain).   Yes [provider]  diclofenac sodium (VOLTAREN) 1 % GEL Apply 4 g topically 2 (two) times daily. Patient taking differently: Apply 4 g topically daily as needed (for pain).  08/28/14  Yes Hyatt, Max T, DPM  enalapril (VASOTEC) 10 MG tablet Take 10 mg by mouth daily.   Yes [provider]  fexofenadine (ALLER-EASE) 180 MG tablet Take 180 mg by mouth daily.   Yes [provider]  fluticasone (FLONASE) 50 MCG/ACT nasal spray Place 2 sprays into both nostrils 2 (two) times daily.  04/12/14  Yes [provider]  furosemide (LASIX) 20 MG tablet Take 20 mg by mouth daily as needed.   Yes [provider]  gabapentin (NEURONTIN) 800 MG tablet TAKE ONE (1) TABLET BY MOUTH TWO (2) TIMES DAILY 03/24/17  Yes Millikan, Megan, NP  latanoprost (XALATAN) 0.005 % ophthalmic solution Place 1 drop into both eyes at bedtime.    Yes [provider]  levothyroxine (SYNTHROID, LEVOTHROID) 112 MCG tablet Take 112 mcg by mouth daily before breakfast.  11/07/15  Yes [provider]  losartan (COZAAR) 50 MG tablet Take 50 mg by mouth every evening.    Yes [provider]  metFORMIN (GLUCOPHAGE) 500 MG tablet Take 500 mg by mouth 2 (two) times daily with a meal.    Yes [provider]  metoprolol succinate (TOPROL-XL) 25 MG 24 hr tablet TAKE ONE TABLET BY MOUTH EVERY DAY 05/12/17  Yes York Spaniel,  MD  montelukast (SINGULAIR) 10 MG tablet Take 10 mg at bedtime by mouth. 01/15/17 01/15/18 Yes [provider]  Multiple Vitamins-Minerals (MULTIVITAMIN ADULT PO) Take by mouth daily.   Yes [provider]  oxybutynin (DITROPAN) 5 MG tablet Take 5 mg by mouth 2 (two) times daily.   Yes [provider]  potassium chloride (K-DUR,KLOR-CON) 10 MEQ tablet Take 10 mEq by mouth daily as needed.   Yes [provider]  Red Yeast Rice 600 MG CAPS Take 1,200 mg by mouth 2 (two) times daily.  Yes [provider]  rOPINIRole (REQUIP) 4 MG tablet Take 1 tablet (4 mg total) by mouth 2 (two) times daily. 06/30/17  Yes Butch Penny, NP  traMADol (ULTRAM) 50 MG tablet Take 1 tablet (50 mg total) by mouth every 6 (six) hours as needed. Must last 28 days 01/15/17  Yes York Spaniel, MD  topiramate (TOPAMAX) 100 MG tablet Take 1 tablet (100 mg total) by mouth at bedtime. Patient not taking: Reported on 09/23/2017 06/30/17   Butch Penny, NP    ROS:  Out of a complete 14 system review of symptoms, the patient complains only of the following symptoms, and all other reviewed systems are negative.  Headache Anxiety  Blood pressure (!) 180/89, pulse 62, height  (1.549 m), weight 193 lb (87.5 kg).  Physical Exam  General: The patient is alert and cooperative at the time of the examination.  The patient is markedly obese.  Skin: No significant peripheral edema is noted.   Neurologic Exam  Mental status: The patient is alert and oriented x 3 at the time of the examination. The patient has apparent normal recent and remote memory, with an apparently normal attention span and concentration ability.   Cranial nerves: Facial symmetry is present. Speech is normal, no aphasia or dysarthria is noted. Extraocular movements are full. Visual fields are full.  Motor: The patient has good strength in all 4 extremities.  Sensory examination: Soft touch sensation is  symmetric on the face, arms, and legs.  Coordination: The patient has good finger-nose-finger and heel-to-shin bilaterally.  Gait and station: The patient has a normal gait. Tandem gait is normal. Romberg is negative. No drift is seen.  Reflexes: Deep tendon reflexes are symmetric.   Assessment/Plan:  1.  Migraine headache  2.  Restless leg syndrome  The patient is doing well with her migraine, she will check whether or not she is on Topamax, if she is, we will try to reduce the dose to 50 mg at night.  The patient is concerned that she may not be able to afford the Aimovig when she gets into the "donut hole".  We will see if we can get some financial assistance for her.  Otherwise, she will follow-up in 6 months.  Marlan Palau MD 09/23/2017 7:42 AM  [facility] [address] [place], [place] [address]  Phone [phone] Fax [phone]

## 2017-10-06 ENCOUNTER — Telehealth: Payer: Self-pay | Admitting: Neurology

## 2017-10-06 NOTE — Telephone Encounter (Signed)
Pt is not interested in the Aimovig after reading the information. She said there are somethings she is not willing to go along with so she is wanting to "take this off the table". She is not willing to give investment and savings information. She is wanting to continue to taking pills for her migraines. Please call to advise.

## 2017-10-06 NOTE — Telephone Encounter (Signed)
I called the patient.  I left a message, I will call back later.  The patient was trying to get financial assistance for her Aimovig, it is standard for the pharmaceutical company to ask about her financial situation to determine whether she is eligible for financial assistance.

## 2017-10-06 NOTE — Telephone Encounter (Signed)
I called the patient again, left a message again.  The patient has not been abusing Aimovig she may need to go back up on the original dose of Topamax taking 100 mg at bedtime.  She will call me if she has any further questions or problems.

## 2017-10-07 NOTE — Telephone Encounter (Signed)
Pt called she doesn't know what happened with her phone yesterday and apologizes for missing the calls. She does understand the directions. FYI

## 2017-12-28 ENCOUNTER — Other Ambulatory Visit: Payer: Medicare HMO

## 2018-01-18 ENCOUNTER — Ambulatory Visit (INDEPENDENT_AMBULATORY_CARE_PROVIDER_SITE_OTHER): Payer: Medicare HMO

## 2018-01-18 ENCOUNTER — Ambulatory Visit: Payer: Medicare HMO | Admitting: Podiatry

## 2018-01-18 DIAGNOSIS — E119 Type 2 diabetes mellitus without complications: Secondary | ICD-10-CM

## 2018-01-18 NOTE — Progress Notes (Signed)
She presents today slightly concerned about some early heel pain she states is been going on for the last few days she also notices some leathery feeling beneath her metatarsophalangeal joints.  She states her blood sugars and shape at this point and has even lost weight.  Objective: Vital signs are stable she is alert and oriented x3.  Pulses are palpable.  No loss of neurologic sensorium per Semmes Weinstein monofilament sharp incurvated nail margins tibiofibular borders of the right foot more so than the left.  No open lesions or wounds no paronychia is no erythema cellulitis drainage or odor.  She has mild tenderness on palpation of the bilateral heels they are not warm to the touch and minimally tender.  Radiographs taken today do not demonstrate any type of major osseous abnormalities.  Assessment: Diabetes mellitus feet appear to be in good shape at this point no open lesions or wounds mild early plantar fasciitis.  Possible early neuropathy.  Plan: Discussed etiology pathology conservative therapies at this point time we will continue to watch her on a yearly basis.  We discussed appropriate shoe gear stretching exercises ice therapy and shoe gear modifications.  Follow-up with her in 1 year or sooner if necessary.

## 2018-01-25 ENCOUNTER — Ambulatory Visit
Admission: RE | Admit: 2018-01-25 | Discharge: 2018-01-25 | Disposition: A | Discharge status: Home / Self Care | Payer: Medicare HMO | Source: Ambulatory Visit | Attending: Urology | Admitting: Urology

## 2018-01-25 ENCOUNTER — Ambulatory Visit: Payer: Medicare HMO | Admitting: Urology

## 2018-01-25 ENCOUNTER — Other Ambulatory Visit: Payer: Self-pay

## 2018-01-25 ENCOUNTER — Encounter: Payer: Self-pay | Admitting: Urology

## 2018-01-25 VITALS — BP 136/78 | HR 60 | Ht 61.0 in | Wt 192.4 lb

## 2018-01-25 DIAGNOSIS — Z87442 Personal history of urinary calculi: Secondary | ICD-10-CM | POA: Insufficient documentation

## 2018-01-25 DIAGNOSIS — R935 Abnormal findings on diagnostic imaging of other abdominal regions, including retroperitoneum: Secondary | ICD-10-CM | POA: Insufficient documentation

## 2018-01-25 NOTE — Progress Notes (Signed)
01/25/2018 9:36 AM   Jessica Cole 1950/07/08 [id number]  Referring provider: Lynnea Ferrier, MD [address] [place] [place], [place] [address]  Chief Complaint  Patient presents with  . Nephrolithiasis    HPI: 67 year old female presents for follow-up of nephrolithiasis.  She underwent shockwave lithotripsy of a left distal ureteral calculus in January 2019.  She had no other calculi on CT.  She was having persistent flank pain postop however a follow-up CT showed no residual hydronephrosis or calculus/fragments.  24-hour urine study was remarkable for low urine volume, hypercalciuria and hypocitraturia.  She denies recurrent flank pain.  She states she has increased her hydration and citrus intake.  KUB performed today was reviewed.  There are multiple pelvic phleboliths present.  No calcifications suspicious for urinary tract stones are identified.   PMH: Past Medical History:  Diagnosis Date  . Anxiety   . Colon polyps   . Degenerative arthritis   . Depression   . Diabetes mellitus without complication (HCC)    Type II  . Environmental allergies   . Family history of adverse reaction to anesthesia    sister- PONV  . Fatty liver   . Glaucoma   . Headache(784.0)    HX  MIGRAINES  . History of bronchitis   . History of kidney stones   . History of pneumonia   . Hypertension   . Hypothyroidism   . Obesity   . OSA on CPAP   . Plantar fasciitis    right  . PONV (postoperative nausea and vomiting)   . Renal calculi   . Renal calculi   . RLS (restless legs syndrome) 08/23/2014    Surgical History: Past Surgical History:  Procedure Laterality Date  . ABDOMINAL HYSTERECTOMY     partial  . APPENDECTOMY    . Arthroscopic surgery, knee Left   . BREAST BIOPSY Right    several  . BREAST BIOPSY Right 03/11/2017   u/s bx neg  . BUNIONECTOMY     LEFT   . COLONOSCOPY W/ POLYPECTOMY    . COLONOSCOPY WITH PROPOFOL N/A 08/14/2017   Procedure:  COLONOSCOPY WITH PROPOFOL;  Surgeon: Christena Deem, MD;  Location: [facility] ENDOSCOPY;  Service: Endoscopy;  Laterality: N/A;  . EXTRACORPOREAL SHOCK WAVE LITHOTRIPSY Left 05/14/2017   Procedure: EXTRACORPOREAL SHOCK WAVE LITHOTRIPSY (ESWL);  Surgeon: Riki Altes, MD;  Location: [facility] ORS;  Service: Urology;  Laterality: Left;  . FOOT SURGERY     RIGHT    . KNEE ARTHROSCOPY W/ MENISCAL REPAIR Left   . LASIK    . MAXIMUM ACCESS (MAS)POSTERIOR LUMBAR INTERBODY FUSION (PLIF) 1 LEVEL N/A 04/25/2016   Procedure: LUMBAR FOUR-FIVE  MAXIMUM ACCESS (MAS) POSTERIOR LUMBAR INTERBODY FUSION (PLIF) with extension of instrumentation LUMBAR TWO-FIVE;  Surgeon: Tia Alert, MD;  Location: [facility] OR;  Service: Neurosurgery;  Laterality: N/A;  . MAXIMUM ACCESS (MAS)POSTERIOR LUMBAR INTERBODY FUSION (PLIF) 2 LEVEL N/A 12/13/2015   Procedure: Lumbar two-three - Lumbar three-four MAXIMUM ACCESS (MAS) POSTERIOR LUMBAR INTERBODY FUSION (PLIF)  ;  Surgeon: Tia Alert, MD;  Location: [facility] NEURO ORS;  Service: Neurosurgery;  Laterality: N/A;  . SHOULDER SURGERY     RIGHT   . TONSILLECTOMY      Home Medications:  Allergies as of 01/25/2018      Reactions   Diazepam    hallucinations   Dexamethasone Other (See Comments)   During a tapered dose, once.  Was shaky (side effect, not allergy).  Medication List        Accurate as of 01/25/18  9:36 AM. Always use your most recent med list.          AIMOVIG (140 MG DOSE) 70 MG/ML Soaj Generic drug:  Erenumab-aooe INJECT 140 MG INTO THE SKIN EVERY 30 DAYS   albuterol 108 (90 Base) MCG/ACT inhaler Commonly known as:  PROVENTIL HFA;VENTOLIN HFA Inhale into the lungs.   ALLER-EASE 180 MG tablet Generic drug:  fexofenadine Take 180 mg by mouth daily.   azelastine 0.1 % nasal spray Commonly known as:  ASTELIN Place 1 spray into both nostrils 2 (two) times daily.   BREO ELLIPTA 200-25 MCG/INH Aepb Generic drug:  fluticasone furoate-vilanterol Take 1 puff  daily by mouth.   busPIRone 7.5 MG tablet Commonly known as:  BUSPAR Take 7.5 mg 2 (two) times daily as needed by mouth (chest pain).   diclofenac sodium 1 % Gel Commonly known as:  VOLTAREN Apply 4 g topically 2 (two) times daily.   enalapril 10 MG tablet Commonly known as:  VASOTEC Take 10 mg by mouth daily.   fluticasone 50 MCG/ACT nasal spray Commonly known as:  FLONASE Place 2 sprays into both nostrils 2 (two) times daily.   furosemide 20 MG tablet Commonly known as:  LASIX Take 20 mg by mouth daily as needed.   gabapentin 800 MG tablet Commonly known as:  NEURONTIN TAKE ONE (1) TABLET BY MOUTH TWO (2) TIMES DAILY   ketoconazole 2 % cream Commonly known as:  NIZORAL APPLY TO AFFECTED AREA EVERY DAY   latanoprost 0.005 % ophthalmic solution Commonly known as:  XALATAN Place 1 drop into both eyes at bedtime.   levothyroxine 112 MCG tablet Commonly known as:  SYNTHROID, LEVOTHROID Take 112 mcg by mouth daily before breakfast.   losartan 50 MG tablet Commonly known as:  COZAAR Take 50 mg by mouth every evening.   metFORMIN 500 MG tablet Commonly known as:  GLUCOPHAGE Take 500 mg by mouth 2 (two) times daily with a meal.   metoprolol succinate 25 MG 24 hr tablet Commonly known as:  TOPROL-XL Take 1 tablet (25 mg total) by mouth daily.   montelukast 10 MG tablet Commonly known as:  SINGULAIR Take 10 mg at bedtime by mouth.   MULTIVITAMIN ADULT PO Take by mouth daily.   ONE TOUCH ULTRA TEST test strip Generic drug:  glucose blood USE 2 (TWO) TIMES DAILY USE AS INSTRUCTED.   oxybutynin 5 MG tablet Commonly known as:  DITROPAN Take 5 mg by mouth 2 (two) times daily.   potassium chloride 10 MEQ tablet Commonly known as:  K-DUR,KLOR-CON Take 10 mEq by mouth daily as needed.   predniSONE 5 MG tablet Commonly known as:  DELTASONE TAKE 6 TABLETS BY MOUTH ON DAY 1, THEN DECREASE BY 1 TABLET DAILY UNTIL GONE (6,5,4,3,2,1)   Red Yeast Rice 600 MG  Caps Take 1,200 mg by mouth 2 (two) times daily.   rOPINIRole 4 MG tablet Commonly known as:  REQUIP Take 1 tablet (4 mg total) by mouth 2 (two) times daily.   tiZANidine 4 MG tablet Commonly known as:  ZANAFLEX TAKE 1/2 TO 1 TABLET BY MOUTH TWICE A DAY AS NEEDED   topiramate 100 MG tablet Commonly known as:  TOPAMAX Take 1 tablet (100 mg total) by mouth at bedtime.   traMADol 50 MG tablet Commonly known as:  ULTRAM Take 1 tablet (50 mg total) by mouth every 6 (six) hours as needed. Must last  28 days       Allergies:  Allergies  Allergen Reactions  . Diazepam     hallucinations  . Dexamethasone Other (See Comments)    During a tapered dose, once.  Was shaky (side effect, not allergy).    Family History: Family History  Problem Relation Age of Onset  . Lung cancer Mother   . Congestive Heart Failure Father   . Depression Sister   . Rheum arthritis Sister   . Headache Maternal Grandfather   . Migraines Maternal Grandfather   . Headache Maternal Uncle   . Diabetes Unknown   . Heart disease Unknown   . Hypertension Unknown   . Breast cancer Neg Hx   . Bladder Cancer Neg Hx   . Kidney cancer Neg Hx     Social History:  reports that she has never smoked. She has never used smokeless tobacco. She reports that she does not drink alcohol or use drugs.  ROS: UROLOGY Frequent Urination?: No Hard to postpone urination?: No Burning/pain with urination?: No Get up at night to urinate?: No Leakage of urine?: No Urine stream starts and stops?: No Trouble starting stream?: No Do you have to strain to urinate?: No Blood in urine?: No Urinary tract infection?: No Sexually transmitted disease?: No Injury to kidneys or bladder?: No Painful intercourse?: No Weak stream?: No Currently pregnant?: No Vaginal bleeding?: No Last menstrual period?: Hysterectomy  Gastrointestinal Nausea?: No Vomiting?: No Indigestion/heartburn?: No Diarrhea?: No Constipation?:  No  Constitutional Fever: No Night sweats?: No Weight loss?: No Fatigue?: No  Skin Skin rash/lesions?: No Itching?: No  Eyes Blurred vision?: No Double vision?: No  Ears/Nose/Throat Sore throat?: No Sinus problems?: Yes  Hematologic/Lymphatic Swollen glands?: No Easy bruising?: Yes  Cardiovascular Leg swelling?: No Chest pain?: No  Respiratory Cough?: Yes Shortness of breath?: No  Endocrine Excessive thirst?: No  Musculoskeletal Back pain?: No Joint pain?: Yes  Neurological Headaches?: Yes Dizziness?: No  Psychologic Depression?: No Anxiety?: Yes  Physical Exam: BP 136/78 (BP Location: Left Arm, Patient Position: Sitting, Cuff Size: Large)   Pulse 60   Ht 5\' 1"  (1.549 m)   Wt 192 lb 6.4 oz (87.3 kg)   BMI 36.35 kg/m   Constitutional:  Alert and oriented, No acute distress. HEENT: [place] AT, moist mucus membranes.  Trachea midline, no masses. Cardiovascular: No clubbing, cyanosis, or edema. Respiratory: Normal respiratory effort, no increased work of breathing. GI: Abdomen is soft, nontender, nondistended, no abdominal masses GU: No CVA tenderness Lymph: No cervical or inguinal lymphadenopathy. Skin: No rashes, bruises or suspicious lesions. Neurologic: Grossly intact, no focal deficits, moving all 4 extremities. Psychiatric: Normal mood and affect.    Pertinent Imaging: Personally reviewed Results for orders placed during the hospital encounter of 01/25/18  Abdomen 1 view (KUB)   Narrative CLINICAL DATA:  Pt denies any pain or urinary symptoms at this time. This is a f/u for 6mm stone. Pt states she thinks she has passed it  EXAM: ABDOMEN - 1 VIEW  COMPARISON:  CT of the abdomen and pelvis on 06/05/2017  FINDINGS: Bowel gas pattern is nonobstructed. No intrarenal calculi are identified. Multiple calcifications in the pelvis are rounded and consistent with phleboliths. Distal ureteral stones would be difficult to exclude. Status post  lumbar fusion.  IMPRESSION: No plain film evidence for nephrolithiasis. Multiple pelvic calcifications are favored to represent phleboliths.   Electronically Signed   By: Norva PavlovElizabeth  Brown M.D.   On: 01/25/2018 08:48     Assessment &  Plan:   Status post shockwave lithotripsy of a solitary distal ureteral calculus.  No evidence of recurrent calculi on today's KUB.  Follow-up 1 year and she was instructed to call earlier for recurrent renal colic.   Return in about 1 year (around 01/26/2019) for Recheck, KUB.  Riki Altes, MD  [facility] [address] [place], [place] [address] [phone]

## 2018-01-26 LAB — MICROSCOPIC EXAMINATION
Bacteria, UA: NONE SEEN
RBC MICROSCOPIC, UA: NONE SEEN /HPF (ref 0–2)

## 2018-01-26 LAB — URINALYSIS, COMPLETE
Bilirubin, UA: NEGATIVE
GLUCOSE, UA: NEGATIVE
KETONES UA: NEGATIVE
Leukocytes, UA: NEGATIVE
NITRITE UA: NEGATIVE
Protein, UA: NEGATIVE
RBC, UA: NEGATIVE
SPEC GRAV UA: 1.01 (ref 1.005–1.030)
UUROB: 0.2 mg/dL (ref 0.2–1.0)
pH, UA: 5.5 (ref 5.0–7.5)

## 2018-02-08 ENCOUNTER — Other Ambulatory Visit: Payer: Self-pay | Admitting: Internal Medicine

## 2018-02-08 DIAGNOSIS — Z1231 Encounter for screening mammogram for malignant neoplasm of breast: Secondary | ICD-10-CM

## 2018-02-09 ENCOUNTER — Other Ambulatory Visit: Payer: Self-pay

## 2018-02-09 ENCOUNTER — Ambulatory Visit: Payer: Medicare HMO | Attending: Physical Medicine and Rehabilitation

## 2018-02-09 ENCOUNTER — Ambulatory Visit: Payer: Medicare HMO

## 2018-02-09 DIAGNOSIS — M545 Low back pain, unspecified: Secondary | ICD-10-CM

## 2018-02-09 DIAGNOSIS — M25551 Pain in right hip: Secondary | ICD-10-CM | POA: Insufficient documentation

## 2018-02-09 DIAGNOSIS — M6281 Muscle weakness (generalized): Secondary | ICD-10-CM | POA: Insufficient documentation

## 2018-02-09 NOTE — Patient Instructions (Signed)
Medbridge Access Code: [email or web address]    Seated Transversus Abdominis Bracing   10x3 with 5 second holds

## 2018-02-09 NOTE — Therapy (Signed)
[facility] [address], [place], [address] Phone: [phone]   Fax:  [phone]  Physical Therapy Evaluation  Patient Details  Name: Jessica Cole MRN: [id number] Date of Birth: 69-Feb-1952 Referring Provider (PT): Merri Ray, DO   Encounter Date: 02/09/2018  PT End of Session - 02/09/18 1438    Visit Number  1    Number of Visits  13    Date for PT Re-Evaluation  03/25/18    Authorization Type  1    Authorization Time Period  of 10 progress report    PT Start Time  1438    PT Stop Time  1558    PT Time Calculation (min)  80 min    Activity Tolerance  Patient tolerated treatment well    Behavior During Therapy  [facility] for tasks assessed/performed       Past Medical History:  Diagnosis Date  . Anxiety   . Colon polyps   . Degenerative arthritis   . Depression   . Diabetes mellitus without complication (HCC)    Type II  . Environmental allergies   . Family history of adverse reaction to anesthesia    sister- PONV  . Fatty liver   . Glaucoma   . Headache(784.0)    HX  MIGRAINES  . History of bronchitis   . History of kidney stones   . History of pneumonia   . Hypertension   . Hypothyroidism   . Obesity   . OSA on CPAP   . Plantar fasciitis    right  . PONV (postoperative nausea and vomiting)   . Renal calculi   . Renal calculi   . RLS (restless legs syndrome) 08/23/2014    Past Surgical History:  Procedure Laterality Date  . ABDOMINAL HYSTERECTOMY     partial  . APPENDECTOMY    . Arthroscopic surgery, knee Left   . BREAST BIOPSY Right    several  . BREAST BIOPSY Right 03/11/2017   u/s bx neg  . BUNIONECTOMY     LEFT   . COLONOSCOPY W/ POLYPECTOMY    . COLONOSCOPY WITH PROPOFOL N/A 08/14/2017   Procedure: COLONOSCOPY WITH PROPOFOL;  Surgeon: Christena Deem, MD;  Location: [facility] ENDOSCOPY;  Service: Endoscopy;  Laterality: N/A;  . EXTRACORPOREAL SHOCK WAVE LITHOTRIPSY  Left 05/14/2017   Procedure: EXTRACORPOREAL SHOCK WAVE LITHOTRIPSY (ESWL);  Surgeon: Riki Altes, MD;  Location: [facility] ORS;  Service: Urology;  Laterality: Left;  . FOOT SURGERY     RIGHT    . KNEE ARTHROSCOPY W/ MENISCAL REPAIR Left   . LASIK    . MAXIMUM ACCESS (MAS)POSTERIOR LUMBAR INTERBODY FUSION (PLIF) 1 LEVEL N/A 04/25/2016   Procedure: LUMBAR FOUR-FIVE  MAXIMUM ACCESS (MAS) POSTERIOR LUMBAR INTERBODY FUSION (PLIF) with extension of instrumentation LUMBAR TWO-FIVE;  Surgeon: Tia Alert, MD;  Location: [facility] OR;  Service: Neurosurgery;  Laterality: N/A;  . MAXIMUM ACCESS (MAS)POSTERIOR LUMBAR INTERBODY FUSION (PLIF) 2 LEVEL N/A 12/13/2015   Procedure: Lumbar two-three - Lumbar three-four MAXIMUM ACCESS (MAS) POSTERIOR LUMBAR INTERBODY FUSION (PLIF)  ;  Surgeon: Tia Alert, MD;  Location: [facility] NEURO ORS;  Service: Neurosurgery;  Laterality: N/A;  . SHOULDER SURGERY     RIGHT   . TONSILLECTOMY      There were no vitals filed for this visit.   Subjective Assessment - 02/09/18 1455    Subjective  R Low back/R hip: 1/10 currently (pt sitting), 7/10 at most for  the past 3 months.     Pertinent History  Low back pain R > L. Pt initally had bursitis R posterior hip at ischial tuberosity area.  A shot made it go away. However pain returned about a week later. Had another shot R posterior hip which did not help.  R hip bursitis pain began around June 2019.  Pt then started having a tingly sensation R leg from knee to ankle all over.  Felt wierd.  Worsened as time progressed. Was given a muscle relaxer by Dr. Yves Dill.  Pt could not walk and had to hang onto furniture to move around.  Both legs got wobbly.  Took her tramadol for pain and both her legs gave way and fell right after she swollowed the pill this past Semptember 2019.   Was fine the next day.  The insurance will not let her have and MRI until she does PT.       Patient Stated Goals  Improve flexibility    Currently in Pain?  Yes     Pain Score  1     Pain Location  Back   R hip   Pain Orientation  Right    Pain Descriptors / Indicators  Dull;Aching;Restless    Pain Type  Chronic pain    Pain Onset  More than a month ago    Pain Frequency  Occasional    Aggravating Factors   Sitting, standing and walking    Pain Relieving Factors  ice, heat to R posterior hip, pain medication, shifting to her L when sitting to take pressure off.          [facility] PT Assessment - 02/09/18 1514      Assessment   Medical Diagnosis  Lumbar DDD, lumbar radiculitis    Referring Provider (PT)  Merri Ray, DO    Onset Date/Surgical Date  01/15/18   date PT referral signed; pain bagan months ago   Hand Dominance  Right    Next MD Visit  around 02/19/2018      Precautions   Precaution Comments  possible fall risk      Restrictions   Other Position/Activity Restrictions  no known weight bearing restrictions      Balance Screen   Has the patient fallen in the past 6 months  Yes    How many times?  1   when legs gave way September 2019   Has the patient had a decrease in activity level because of a fear of falling?   No    Is the patient reluctant to leave their home because of a fear of falling?   No      Home Public house manager residence    Living Arrangements  Spouse/significant other    Home Access  Stairs to enter    Entrance Stairs-Number of Steps  6-8   back entrance   Home Layout  One level      Prior Function   Level of Independence  Independent    Vocation  Retired    Leisure  likes to bake, sew, read, gardening      Observation/Other Assessments   Observations  (-) calf squeeze bilaterally    Focus on Therapeutic Outcomes (FOTO)   lumbar spine FOTO 53      Posture/Postural Control   Posture Comments  protracted neck, bilaterally protracted shoulders, kyphosis, R foot pronation      AROM   Lumbar Flexion  WFL with R  posterior hip symptoms when returning to neutral.     Lumbar  Extension  WFL with reproduction or R posterior hip symptoms.     Lumbar - Right Side [facility] with reproduction of symptoms (R L5 dermatome)    Lumbar - Left Side [facility] with L L 5 and R L 5 dermatome symptoms.     Lumbar - Right Rotation  WFL with thoracolumbar discomfort    Lumbar - Left Rotation  WFL with thoracolumbar discomfort.       Strength   Right Hip Flexion  4-/5    Right Hip Extension  4-/5   seated manually resisted   Right Hip ABduction  4-/5   seated clamshell isometrics   Left Hip Flexion  4/5   with R low back side bend and twinge   Left Hip Extension  4/5   seated manually resisted   Left Hip ABduction  4-/5   seated clamshell isometrics   Right Knee Flexion  4+/5    Right Knee Extension  5/5    Left Knee Flexion  4/5    Left Knee Extension  5/5      Palpation   Palpation comment  muscle tension bilateral lumbar paraspinal muscle. Muscle tension R quadratus lumborum.  TTP R > L piriformis muscle area.       Ambulation/Gait   Gait Comments  slight decreased stance phase L LE, bilateral pelvic drop    decrease femoral control + increased pelvic drop with stairs               Objective measurements completed on examination: See above findings.    Blood pressure L arm sitting, mechanically taken, normal cuff 156/73, HR 56    Sitting with feet propped on 5 inch step. Slight decreased in R hip pain but increase in B lateral leg pain   R upper trap tension  Next MD appt around 02/19/2018  Medbridge Access Code: [email or web address]    Therapeutic exercise   Seated hip adductor pillow squeeze 10x5 seconds   Seated naval ins 10x2 with 5 second holds  Decreased back muscle tension palpated   Reviewed and given as part of her HEP. Pt demonstrated and verbalized understanding.     try seated bilateral scapular retraction next visit if appropriate   Improved exercise technique, movement at target joints, use of target muscles after mod verbal,  visual, tactile cues.    Patient is a 67 year old female who came to physical therapy secondary to R posterior hip and R LE pain. She also presents with reproduction of symptoms with lumbar AROM, TTP,  bilateral hip weakness, altered gait pattern and posture, decreased pelvic and femoral control, and difficulty performing functional tasks such as walking and tolerating positions such as prolonged sitting and standing. Patient will benefit from skilled physical therapy services to address the aforementioned deficits.             PT Education - 02/09/18 1943    Education provided  Yes    Education Details  ther-ex, HEP, plan of care    Person(s) Educated  Patient    Methods  Explanation;Demonstration;Tactile cues;Verbal cues;Handout    Comprehension  Returned demonstration;Verbalized understanding       PT Short Term Goals - 02/09/18 1635      PT SHORT TERM GOAL #1   Title  Patient will be independent with her HEP to promote ability to perform functional tasks more comfortably for her back and  R LE.     Baseline  Pt has started her HEP. (02/09/2018)    Time  3    Period  Weeks    Status  New    Target Date  02/11/18        PT Long Term Goals - 02/09/18 1628      PT LONG TERM GOAL #1   Title  Patient will have a decrease in R low back/ hip pain to 4/10 or less at worst to improve ability to perform standing tasks, improve ability to ambulate longer distances, tolerate sitting for longer periods.     Baseline  7/10 back/R hip pain at worst for the past 3 months (02/09/2018)    Time  6    Period  Weeks    Status  New    Target Date  03/25/18      PT LONG TERM GOAL #2   Title  Pt will improve her lumbar spine FOTO score by at least 10 points as a demonstration of improved function.     Baseline  Lumbar spine FOTO 53 (02/09/2018)    Time  6    Period  Weeks    Status  New    Target Date  03/25/18      PT LONG TERM GOAL #3   Title  Patient will improve bilateral hip  abduction and extension strength by at least 1/2 MMT to promote ability to perform standing tasks with less back and R LE pain.     Time  6    Period  Weeks    Status  New    Target Date  03/25/18             Plan - 02/09/18 1622    Clinical Impression Statement  Patient is a 67 year old female who came to physical therapy secondary to R posterior hip and R LE pain. She also presents with reproduction of symptoms with lumbar AROM, TTP,  bilateral hip weakness, altered gait pattern and posture, decreased pelvic and femoral control, and difficulty performing functional tasks such as walking and tolerating positions such as prolonged sitting and standing. Patient will benefit from skilled physical therapy services to address the aforementioned deficits.     History and Personal Factors relevant to plan of care:  back pain, hx of back surgeries, poor posture, multiple comorbidities, age    Clinical Presentation  Evolving    Clinical Presentation due to:  Symptoms seem to be worse overall based on pt subjective reports.     Clinical Decision Making  Moderate    Rehab Potential  Fair    Clinical Impairments Affecting Rehab Potential  (-) comorbidities, hip weakness, age, back and R LE pain and symptoms; (+) motivated    PT Frequency  2x / week    PT Duration  6 weeks    PT Treatment/Interventions  Therapeutic activities;Therapeutic exercise;Neuromuscular re-education;Patient/family education;Manual techniques;Dry needling;Aquatic Therapy;Electrical Stimulation;Iontophoresis 4mg /ml Dexamethasone;Gait training;Stair training    PT Next Visit Plan  scapular, trunk, glute strengthening, thoracic extension, manual techniques, modalities PRN    Consulted and Agree with Plan of Care  Patient       Patient will benefit from skilled therapeutic intervention in order to improve the following deficits and impairments:  Pain, Postural dysfunction, Improper body mechanics, Difficulty walking, Decreased  strength  Visit Diagnosis: Low back pain, unspecified back pain laterality, unspecified chronicity, unspecified whether sciatica present - Plan: PT plan of care cert/re-cert  Muscle weakness (generalized) -  Plan: PT plan of care cert/re-cert  Pain in right hip - Plan: PT plan of care cert/re-cert     Problem List Patient Active Problem List   Diagnosis Date Noted  . Chronic cough 11/19/2016  . Morbid obesity (HCC) 09/26/2016  . Left ovarian cyst 05/30/2016  . S/P lumbar spinal fusion 12/13/2015  . Aortic calcification (HCC) 07/18/2015  . Controlled type 2 diabetes mellitus without complication (HCC) 05/16/2015  . Thrombocytopenia (HCC) 05/16/2015  . Recurrent major depressive disorder, in full remission (HCC) 05/16/2015  . Essential (primary) hypertension 05/16/2015  . Fatty infiltration of liver 03/15/2015  . Aortic valve stenosis, nonrheumatic 01/18/2015  . Sciatica of right side 01/09/2015  . Type 2 diabetes mellitus (HCC) 11/10/2014  . RLS (restless legs syndrome) 08/23/2014  . Neuritis or radiculitis due to rupture of lumbar intervertebral disc 06/13/2014  . Degeneration of intervertebral disc of lumbar region 06/13/2014  . Lumbar radiculitis 06/13/2014  . Arthritis 01/23/2014  . Arthritis of knee, degenerative 11/15/2013  . Depression 09/29/2013  . Insomnia 09/29/2013  . Apnea, sleep 08/29/2013  . Calculus of kidney 08/29/2013  . Abnormal presence of protein in urine 08/29/2013  . Headache, migraine 08/29/2013  . BP (high blood pressure) 08/29/2013  . HLD (hyperlipidemia) 08/29/2013  . Allergic rhinitis 08/29/2013  . Intractable migraine without aura 02/18/2013    Loralyn Freshwater PT, DPT   02/09/2018, 7:50 PM  [facility]IC[facility] [address], [place], [address] Phone: [phone]   Fax:  [phone]  Name: IVANELL DESHOTEL MRN: [id number] Date of Birth: 1950/06/09

## 2018-02-11 ENCOUNTER — Ambulatory Visit: Payer: Medicare HMO

## 2018-02-11 DIAGNOSIS — M25551 Pain in right hip: Secondary | ICD-10-CM

## 2018-02-11 DIAGNOSIS — M6281 Muscle weakness (generalized): Secondary | ICD-10-CM

## 2018-02-11 DIAGNOSIS — M545 Low back pain, unspecified: Secondary | ICD-10-CM

## 2018-02-11 NOTE — Therapy (Signed)
[facility] [address], [place], [address] Phone: [phone]   Fax:  [phone]  Physical Therapy Treatment  Patient Details  Name: Jessica Cole MRN: [id number] Date of Birth: [birth year] Referring Provider (PT): Merri Ray, DO   Encounter Date: 02/11/2018  PT End of Session - 02/11/18 0816    Visit Number  2    Number of Visits  13    Date for PT Re-Evaluation  03/25/18    Authorization Type  2    Authorization Time Period  of 10 progress report    PT Start Time  0817    PT Stop Time  0903    PT Time Calculation (min)  46 min    Activity Tolerance  Patient tolerated treatment well    Behavior During Therapy  [facility] for tasks assessed/performed       Past Medical History:  Diagnosis Date  . Anxiety   . Colon polyps   . Degenerative arthritis   . Depression   . Diabetes mellitus without complication (HCC)    Type II  . Environmental allergies   . Family history of adverse reaction to anesthesia    sister- PONV  . Fatty liver   . Glaucoma   . Headache(784.0)    HX  MIGRAINES  . History of bronchitis   . History of kidney stones   . History of pneumonia   . Hypertension   . Hypothyroidism   . Obesity   . OSA on CPAP   . Plantar fasciitis    right  . PONV (postoperative nausea and vomiting)   . Renal calculi   . Renal calculi   . RLS (restless legs syndrome) 08/23/2014    Past Surgical History:  Procedure Laterality Date  . ABDOMINAL HYSTERECTOMY     partial  . APPENDECTOMY    . Arthroscopic surgery, knee Left   . BREAST BIOPSY Right    several  . BREAST BIOPSY Right 03/11/2017   u/s bx neg  . BUNIONECTOMY     LEFT   . COLONOSCOPY W/ POLYPECTOMY    . COLONOSCOPY WITH PROPOFOL N/A 08/14/2017   Procedure: COLONOSCOPY WITH PROPOFOL;  Surgeon: Christena Deem, MD;  Location: [facility] ENDOSCOPY;  Service: Endoscopy;  Laterality: N/A;  . EXTRACORPOREAL SHOCK WAVE LITHOTRIPSY  Left 05/14/2017   Procedure: EXTRACORPOREAL SHOCK WAVE LITHOTRIPSY (ESWL);  Surgeon: Riki Altes, MD;  Location: [facility] ORS;  Service: Urology;  Laterality: Left;  . FOOT SURGERY     RIGHT    . KNEE ARTHROSCOPY W/ MENISCAL REPAIR Left   . LASIK    . MAXIMUM ACCESS (MAS)POSTERIOR LUMBAR INTERBODY FUSION (PLIF) 1 LEVEL N/A 04/25/2016   Procedure: LUMBAR FOUR-FIVE  MAXIMUM ACCESS (MAS) POSTERIOR LUMBAR INTERBODY FUSION (PLIF) with extension of instrumentation LUMBAR TWO-FIVE;  Surgeon: Tia Alert, MD;  Location: [facility];  Service: Neurosurgery;  Laterality: N/A;  . MAXIMUM ACCESS (MAS)POSTERIOR LUMBAR INTERBODY FUSION (PLIF) 2 LEVEL N/A 12/13/2015   Procedure: Lumbar two-three - Lumbar three-four MAXIMUM ACCESS (MAS) POSTERIOR LUMBAR INTERBODY FUSION (PLIF)  ;  Surgeon: Tia Alert, MD;  Location: [facility] NEURO ORS;  Service: Neurosurgery;  Laterality: N/A;  . SHOULDER SURGERY     RIGHT   . TONSILLECTOMY      There were no vitals filed for this visit.  Subjective Assessment - 02/11/18 0818    Subjective  I'm hurting some. Hurt all night real bad after eval on Tuesday night.  Had to take a pain pill early Wednesday morning. Took a while to ease down. Back still hurts some.  2/10 currently. Last pain pill was very early Wednesday morning.   2/10 when up and walking as well.  Has to shift to the L to take pressure off R low back/hip when sitting.  Also had the sensation B LE Tuesday night.     Pertinent History  Low back pain R > L. Pt initally had bursitis R posterior hip at ischial tuberosity area.  A shot made it go away. However pain returned about a week later. Had another shot R posterior hip which did not help.  R hip bursitis pain began around June 2019.  Pt then started having a tingly sensation R leg from knee to ankle all over.  Felt wierd.  Worsened as time progressed. Was given a muscle relaxer by Dr. Yves Dill.  Pt could not walk and had to hang onto furniture to move around.  Both legs got  wobbly.  Took her tramadol for pain and both her legs gave way and fell right after she swollowed the pill this past Semptember 2019.   Was fine the next day.  The insurance will not let her have and MRI until she does PT.       Patient Stated Goals  Improve flexibility    Currently in Pain?  Yes    Pain Score  2     Pain Onset  More than a month ago                               PT Education - 02/11/18 0959    Education provided  Yes    Education Details  ther-ex, HEP    Person(s) Educated  Patient    Methods  Explanation;Demonstration;Tactile cues;Verbal cues    Comprehension  Returned demonstration;Verbalized understanding      Objective     R upper trap tension  Next MD appt around 02/19/2018  Medbridge Access Code: [email or web address]   Manual therapy  Seated STM R quadratus lumborum.   Seated STM B lumbar paraspoinal muscles   Decreased low back pain but feels funny feeling B legs from knees to ankles    Therapeutic exercise  Seated position: feet propped on Air Ex pad  Seated B scapular retraction 10x, then 10x5 seconds. Thoracic spine discomfort with the holds, then 10x to promote gentle thoracic extension.   Seated glute max squeeze 10x5 seconds for 3 sets  Decreased funny feeling in legs  Seated naval ins (transversus abdominis contraction) 10x3 for 5 seconds   Seated bilateral shoulder extension isometrics, hands on thighs 10x5 seconds  Seated hip adduction ball squeeze with glute max squeeze 10x3  Decreased funny feeling in B legs  Try seated manually resisted trunk flexion with PT resistance next visit if appropriate  Pt was recommended to bring her previous HEP to clinic   Improved exercise technique, movement at target joints, use of target muscles after mod verbal, visual, tactile cues.   Decreased B leg symptoms with exercises to promote glute max muscle use. Decreased low back pain with treatment to decrease lumbar  paraspinal muscle tension. Pt will benefit from continued skilled physical therapy services to decrease back and LE pain and improve function.          PT Short Term Goals - 02/09/18 1635      PT SHORT TERM GOAL #1  Title  Patient will be independent with her HEP to promote ability to perform functional tasks more comfortably for her back and R LE.     Baseline  Pt has started her HEP. (02/09/2018)    Time  3    Period  Weeks    Status  New    Target Date  02/11/18        PT Long Term Goals - 02/09/18 1628      PT LONG TERM GOAL #1   Title  Patient will have a decrease in R low back/ hip pain to 4/10 or less at worst to improve ability to perform standing tasks, improve ability to ambulate longer distances, tolerate sitting for longer periods.     Baseline  7/10 back/R hip pain at worst for the past 3 months (02/09/2018)    Time  6    Period  Weeks    Status  New    Target Date  03/25/18      PT LONG TERM GOAL #2   Title  Pt will improve her lumbar spine FOTO score by at least 10 points as a demonstration of improved function.     Baseline  Lumbar spine FOTO 53 (02/09/2018)    Time  6    Period  Weeks    Status  New    Target Date  03/25/18      PT LONG TERM GOAL #3   Title  Patient will improve bilateral hip abduction and extension strength by at least 1/2 MMT to promote ability to perform standing tasks with less back and R LE pain.     Time  6    Period  Weeks    Status  New    Target Date  03/25/18            Plan - 02/11/18 0817    Clinical Impression Statement  Decreased B leg symptoms with exercises to promote glute max muscle use. Decreased low back pain with treatment to decrease lumbar paraspinal muscle tension. Pt will benefit from continued skilled physical therapy services to decrease back and LE pain and improve function.     Rehab Potential  Fair    Clinical Impairments Affecting Rehab Potential  (-) comorbidities, hip weakness, age, back and R LE  pain and symptoms; (+) motivated    PT Frequency  2x / week    PT Duration  6 weeks    PT Treatment/Interventions  Therapeutic activities;Therapeutic exercise;Neuromuscular re-education;Patient/family education;Manual techniques;Dry needling;Aquatic Therapy;Electrical Stimulation;Iontophoresis 4mg /ml Dexamethasone;Gait training;Stair training    PT Next Visit Plan  scapular, trunk, glute strengthening, thoracic extension, manual techniques, modalities PRN    Consulted and Agree with Plan of Care  Patient       Patient will benefit from skilled therapeutic intervention in order to improve the following deficits and impairments:  Pain, Postural dysfunction, Improper body mechanics, Difficulty walking, Decreased strength  Visit Diagnosis: Low back pain, unspecified back pain laterality, unspecified chronicity, unspecified whether sciatica present  Muscle weakness (generalized)  Pain in right hip     Problem List Patient Active Problem List   Diagnosis Date Noted  . Chronic cough 11/19/2016  . Morbid obesity (HCC) 09/26/2016  . Left ovarian cyst 05/30/2016  . S/P lumbar spinal fusion 12/13/2015  . Aortic calcification (HCC) 07/18/2015  . Controlled type 2 diabetes mellitus without complication (HCC) 05/16/2015  . Thrombocytopenia (HCC) 05/16/2015  . Recurrent major depressive disorder, in full remission (HCC) 05/16/2015  . Essential (primary)  hypertension 05/16/2015  . Fatty infiltration of liver 03/15/2015  . Aortic valve stenosis, nonrheumatic 01/18/2015  . Sciatica of right side 01/09/2015  . Type 2 diabetes mellitus (HCC) 11/10/2014  . RLS (restless legs syndrome) 08/23/2014  . Neuritis or radiculitis due to rupture of lumbar intervertebral disc 06/13/2014  . Degeneration of intervertebral disc of lumbar region 06/13/2014  . Lumbar radiculitis 06/13/2014  . Arthritis 01/23/2014  . Arthritis of knee, degenerative 11/15/2013  . Depression 09/29/2013  . Insomnia 09/29/2013   . Apnea, sleep 08/29/2013  . Calculus of kidney 08/29/2013  . Abnormal presence of protein in urine 08/29/2013  . Headache, migraine 08/29/2013  . BP (high blood pressure) 08/29/2013  . HLD (hyperlipidemia) 08/29/2013  . Allergic rhinitis 08/29/2013  . Intractable migraine without aura 02/18/2013    Loralyn Freshwater PT, DPT   02/11/2018, 10:05 AM  [facility] SPORTS MEDICINE [address], [place], [address] Phone: [phone]   Fax:  [phone]  Name: Jessica Cole MRN: [id number] Date of Birth: [birth year]

## 2018-02-11 NOTE — Patient Instructions (Addendum)
Seated glute max squeeze  Sitting on a chair  Squeeze your rear end muscles together.    Hold for 5 seconds   Repeat 10 times   Perform at least 3 sets daily.         Adduction: Hip - Knees Together (Sitting)   Sit with ball between knees (not shown). Squeeze your rear end muscles. Push knees together. Hold for _0 to 5__ seconds. Repeat _10__ times. Do __3_ times a day.  Copyright  VHI. All rights reserved.

## 2018-02-16 ENCOUNTER — Ambulatory Visit: Payer: Medicare HMO

## 2018-02-16 DIAGNOSIS — M545 Low back pain, unspecified: Secondary | ICD-10-CM

## 2018-02-16 DIAGNOSIS — M25551 Pain in right hip: Secondary | ICD-10-CM

## 2018-02-16 DIAGNOSIS — M6281 Muscle weakness (generalized): Secondary | ICD-10-CM

## 2018-02-16 NOTE — Therapy (Signed)
[facility]ICINE [address] [place], [address] Phone: [phone]   Fax:  [phone]  Physical Therapy Treatment  Patient Details  Name: Jessica Cole MRN: [id number] Date of Birth: 01-Oct-1950 Referring Provider (PT): Merri Ray, DO   Encounter Date: 02/16/2018  PT End of Session - 02/16/18 1340    Visit Number  3    Number of Visits  13    Date for PT Re-Evaluation  03/25/18    Authorization Type  3    Authorization Time Period  of 10 progress report    PT Start Time  1340    PT Stop Time  1422    PT Time Calculation (min)  42 min    Activity Tolerance  Patient tolerated treatment well    Behavior During Therapy  [facility] for tasks assessed/performed       Past Medical History:  Diagnosis Date  . Anxiety   . Colon polyps   . Degenerative arthritis   . Depression   . Diabetes mellitus without complication (HCC)    Type II  . Environmental allergies   . Family history of adverse reaction to anesthesia    sister- PONV  . Fatty liver   . Glaucoma   . Headache(784.0)    HX  MIGRAINES  . History of bronchitis   . History of kidney stones   . History of pneumonia   . Hypertension   . Hypothyroidism   . Obesity   . OSA on CPAP   . Plantar fasciitis    right  . PONV (postoperative nausea and vomiting)   . Renal calculi   . Renal calculi   . RLS (restless legs syndrome) 08/23/2014    Past Surgical History:  Procedure Laterality Date  . ABDOMINAL HYSTERECTOMY     partial  . APPENDECTOMY    . Arthroscopic surgery, knee Left   . BREAST BIOPSY Right    several  . BREAST BIOPSY Right 03/11/2017   u/s bx neg  . BUNIONECTOMY     LEFT   . COLONOSCOPY W/ POLYPECTOMY    . COLONOSCOPY WITH PROPOFOL N/A 08/14/2017   Procedure: COLONOSCOPY WITH PROPOFOL;  Surgeon: Christena Deem, MD;  Location: [facility] ENDOSCOPY;  Service: Endoscopy;  Laterality: N/A;  . EXTRACORPOREAL SHOCK WAVE LITHOTRIPSY  Left 05/14/2017   Procedure: EXTRACORPOREAL SHOCK WAVE LITHOTRIPSY (ESWL);  Surgeon: Riki Altes, MD;  Location: [facility] ORS;  Service: Urology;  Laterality: Left;  . FOOT SURGERY     RIGHT    . KNEE ARTHROSCOPY W/ MENISCAL REPAIR Left   . LASIK    . MAXIMUM ACCESS (MAS)POSTERIOR LUMBAR INTERBODY FUSION (PLIF) 1 LEVEL N/A 04/25/2016   Procedure: LUMBAR FOUR-FIVE  MAXIMUM ACCESS (MAS) POSTERIOR LUMBAR INTERBODY FUSION (PLIF) with extension of instrumentation LUMBAR TWO-FIVE;  Surgeon: Tia Alert, MD;  Location: [facility] OR;  Service: Neurosurgery;  Laterality: N/A;  . MAXIMUM ACCESS (MAS)POSTERIOR LUMBAR INTERBODY FUSION (PLIF) 2 LEVEL N/A 12/13/2015   Procedure: Lumbar two-three - Lumbar three-four MAXIMUM ACCESS (MAS) POSTERIOR LUMBAR INTERBODY FUSION (PLIF)  ;  Surgeon: Tia Alert, MD;  Location: [facility] NEURO ORS;  Service: Neurosurgery;  Laterality: N/A;  . SHOULDER SURGERY     RIGHT   . TONSILLECTOMY      There were no vitals filed for this visit.  Subjective Assessment - 02/16/18 1341    Subjective  Back is not bad. Was doing pretty good until she kept bending over  looking for her watch. Also picked up something that was not too light. 1/10 currently.  A little better after last session.  Feels restless legs which might be getting worse for about a week or two.  Had restless legs before her back surgery.     Pertinent History  Low back pain R > L. Pt initally had bursitis R posterior hip at ischial tuberosity area.  A shot made it go away. However pain returned about a week later. Had another shot R posterior hip which did not help.  R hip bursitis pain began around June 2019.  Pt then started having a tingly sensation R leg from knee to ankle all over.  Felt wierd.  Worsened as time progressed. Was given a muscle relaxer by Dr. Yves Dill.  Pt could not walk and had to hang onto furniture to move around.  Both legs got wobbly.  Took her tramadol for pain and both her legs gave way and fell right  after she swollowed the pill this past Semptember 2019.   Was fine the next day.  The insurance will not let her have and MRI until she does PT.       Patient Stated Goals  Improve flexibility    Currently in Pain?  Yes    Pain Score  1     Pain Onset  More than a month ago                               PT Education - 02/16/18 1404    Education provided  Yes    Education Details  ther-ex    [employer]) Educated  Patient    Methods  Explanation;Demonstration;Tactile cues;Verbal cues    Comprehension  Returned demonstration;Verbalized understanding        Objective    R upper trap tension  Next MD appt around 02/19/2018  MedbridgeAccess Code: [email or web address]  Manual therapy    Seated STM B lumbar paraspoinal muscles         discomfort B LE   Therapeutic exercise  Seated position: feet propped on Air Ex pad   seated manually resisted trunk flexion with PT resistance, Pt holding PVC rod 10x3 with 5 seconds. R low back discomfort, tolerable  Seated manually resisted trunk extension isometrics with PT resistance, Pt holding onto PVC rod 10x5 seconds for 3 sets. No increase in back pain. Possible decrease in pain  Seated bilateral shoulder extension with scapular retraction resisting yellow band 10x. Increased R low back symptoms.   Seated glute max squeeze 10x10 seconds for 2 sets  Seated manually resisted trunk rotation isometrics in neutral with PT 10x5 seconds each side.     Seated hip adduction ball squeeze with glute max squeeze 10x2           R low back discomfort  Seated naval ins 10x5 seconds for 3 sets to promote activation of transversus abdominis muscles.     Improved exercise technique, movement at target joints, use of target muscles after mod verbal, visual, tactile cues.    Increased low back discomfort R > L with prolonged sitting. Decreased low back pain in sitting with activation of transversus abdominis muscles.          PT Short Term Goals - 02/09/18 1635      PT SHORT TERM GOAL #1   Title  Patient will be independent with her HEP to promote ability to perform functional tasks  more comfortably for her back and R LE.     Baseline  Pt has started her HEP. (02/09/2018)    Time  3    Period  Weeks    Status  New    Target Date  02/11/18        PT Long Term Goals - 02/09/18 1628      PT LONG TERM GOAL #1   Title  Patient will have a decrease in R low back/ hip pain to 4/10 or less at worst to improve ability to perform standing tasks, improve ability to ambulate longer distances, tolerate sitting for longer periods.     Baseline  7/10 back/R hip pain at worst for the past 3 months (02/09/2018)    Time  6    Period  Weeks    Status  New    Target Date  03/25/18      PT LONG TERM GOAL #2   Title  Pt will improve her lumbar spine FOTO score by at least 10 points as a demonstration of improved function.     Baseline  Lumbar spine FOTO 53 (02/09/2018)    Time  6    Period  Weeks    Status  New    Target Date  03/25/18      PT LONG TERM GOAL #3   Title  Patient will improve bilateral hip abduction and extension strength by at least 1/2 MMT to promote ability to perform standing tasks with less back and R LE pain.     Time  6    Period  Weeks    Status  New    Target Date  03/25/18            Plan - 02/16/18 1340    Clinical Impression Statement  Increased low back discomfort R > L with prolonged sitting. Decreased low back pain in sitting with activation of transversus abdominis muscles.     Rehab Potential  Fair    Clinical Impairments Affecting Rehab Potential  (-) comorbidities, hip weakness, age, back and R LE pain and symptoms; (+) motivated    PT Frequency  2x / week    PT Duration  6 weeks    PT Treatment/Interventions  Therapeutic activities;Therapeutic exercise;Neuromuscular re-education;Patient/family education;Manual techniques;Dry needling;Aquatic Therapy;Electrical  Stimulation;Iontophoresis 4mg /ml Dexamethasone;Gait training;Stair training    PT Next Visit Plan  scapular, trunk, glute strengthening, thoracic extension, manual techniques, modalities PRN    Consulted and Agree with Plan of Care  Patient       Patient will benefit from skilled therapeutic intervention in order to improve the following deficits and impairments:  Pain, Postural dysfunction, Improper body mechanics, Difficulty walking, Decreased strength  Visit Diagnosis: Low back pain, unspecified back pain laterality, unspecified chronicity, unspecified whether sciatica present  Muscle weakness (generalized)  Pain in right hip     Problem List Patient Active Problem List   Diagnosis Date Noted  . Chronic cough 11/19/2016  . Morbid obesity (HCC) 09/26/2016  . Left ovarian cyst 05/30/2016  . S/P lumbar spinal fusion 12/13/2015  . Aortic calcification (HCC) 07/18/2015  . Controlled type 2 diabetes mellitus without complication (HCC) 05/16/2015  . Thrombocytopenia (HCC) 05/16/2015  . Recurrent major depressive disorder, in full remission (HCC) 05/16/2015  . Essential (primary) hypertension 05/16/2015  . Fatty infiltration of liver 03/15/2015  . Aortic valve stenosis, nonrheumatic 01/18/2015  . Sciatica of right side 01/09/2015  . Type 2 diabetes mellitus (HCC) 11/10/2014  . RLS (restless legs  syndrome) 08/23/2014  . Neuritis or radiculitis due to rupture of lumbar intervertebral disc 06/13/2014  . Degeneration of intervertebral disc of lumbar region 06/13/2014  . Lumbar radiculitis 06/13/2014  . Arthritis 01/23/2014  . Arthritis of knee, degenerative 11/15/2013  . Depression 09/29/2013  . Insomnia 09/29/2013  . Apnea, sleep 08/29/2013  . Calculus of kidney 08/29/2013  . Abnormal presence of protein in urine 08/29/2013  . Headache, migraine 08/29/2013  . BP (high blood pressure) 08/29/2013  . HLD (hyperlipidemia) 08/29/2013  . Allergic rhinitis 08/29/2013  . Intractable  migraine without aura 02/18/2013    Loralyn Freshwater PT, DPT   02/16/2018, 6:46 PM  [facility] PHYSICAL AND SPORTS MEDICINE [address], [place], [address] Phone: [phone]   Fax:  [phone]  Name: Jessica Cole MRN: [id number] Date of Birth: 1951/01/07

## 2018-02-18 ENCOUNTER — Ambulatory Visit: Payer: Medicare HMO

## 2018-02-18 DIAGNOSIS — M25551 Pain in right hip: Secondary | ICD-10-CM

## 2018-02-18 DIAGNOSIS — M545 Low back pain, unspecified: Secondary | ICD-10-CM

## 2018-02-18 DIAGNOSIS — M6281 Muscle weakness (generalized): Secondary | ICD-10-CM

## 2018-02-18 NOTE — Therapy (Signed)
[place] [facility] [address] [place], [address] Phone: [phone]   Fax:  [phone]  Physical Therapy Treatment  Patient Details  Name: Jessica Cole MRN: [id number] Date of Birth: 1950-05-19 Referring Provider (PT): Merri Ray, DO   Encounter Date: 02/18/2018  PT End of Session - 02/18/18 1348    Visit Number  4    Number of Visits  13    Date for PT Re-Evaluation  03/25/18    Authorization Type  4    Authorization Time Period  of 10 progress report    PT Start Time  1348    PT Stop Time  1432    PT Time Calculation (min)  44 min    Activity Tolerance  Patient tolerated treatment well    Behavior During Therapy  [facility] for tasks assessed/performed       Past Medical History:  Diagnosis Date  . Anxiety   . Colon polyps   . Degenerative arthritis   . Depression   . Diabetes mellitus without complication (HCC)    Type II  . Environmental allergies   . Family history of adverse reaction to anesthesia    sister- PONV  . Fatty liver   . Glaucoma   . Headache(784.0)    HX  MIGRAINES  . History of bronchitis   . History of kidney stones   . History of pneumonia   . Hypertension   . Hypothyroidism   . Obesity   . OSA on CPAP   . Plantar fasciitis    right  . PONV (postoperative nausea and vomiting)   . Renal calculi   . Renal calculi   . RLS (restless legs syndrome) 08/23/2014    Past Surgical History:  Procedure Laterality Date  . ABDOMINAL HYSTERECTOMY     partial  . APPENDECTOMY    . Arthroscopic surgery, knee Left   . BREAST BIOPSY Right    several  . BREAST BIOPSY Right 03/11/2017   u/s bx neg  . BUNIONECTOMY     LEFT   . COLONOSCOPY W/ POLYPECTOMY    . COLONOSCOPY WITH PROPOFOL N/A 08/14/2017   Procedure: COLONOSCOPY WITH PROPOFOL;  Surgeon: Christena Deem, MD;  Location: [facility] ENDOSCOPY;  Service: Endoscopy;  Laterality: N/A;  . EXTRACORPOREAL SHOCK WAVE LITHOTRIPSY  Left 05/14/2017   Procedure: EXTRACORPOREAL SHOCK WAVE LITHOTRIPSY (ESWL);  Surgeon: Riki Altes, MD;  Location: [facility] ORS;  Service: Urology;  Laterality: Left;  . FOOT SURGERY     RIGHT    . KNEE ARTHROSCOPY W/ MENISCAL REPAIR Left   . LASIK    . MAXIMUM ACCESS (MAS)POSTERIOR LUMBAR INTERBODY FUSION (PLIF) 1 LEVEL N/A 04/25/2016   Procedure: LUMBAR FOUR-FIVE  MAXIMUM ACCESS (MAS) POSTERIOR LUMBAR INTERBODY FUSION (PLIF) with extension of instrumentation LUMBAR TWO-FIVE;  Surgeon: Tia Alert, MD;  Location[facility] OR;  Service: Neurosurgery;  Laterality: N/A;  . MAXIMUM ACCESS (MAS)POSTERIOR LUMBAR INTERBODY FUSION (PLIF) 2 LEVEL N/A 12/13/2015   Procedure: Lumbar two-three - Lumbar three-four MAXIMUM ACCESS (MAS) POSTERIOR LUMBAR INTERBODY FUSION (PLIF)  ;  Surgeon: Tia Alert, MD;  Location: [facility] NEURO ORS;  Service: Neurosurgery;  Laterality: N/A;  . SHOULDER SURGERY     RIGHT   . TONSILLECTOMY      There were no vitals filed for this visit.  Subjective Assessment - 02/18/18 1349    Subjective  Has been plagued with migraines since last time she was here. Has had  migrains since she can remember. Back is hurting just a tinsy bit. Might have to stop walking up her back steps. Back is in pain before going to bed.  Has a hard time turning over in bed due to pain.  2/10 currently.     Pertinent History  Low back pain R > L. Pt initally had bursitis R posterior hip at ischial tuberosity area.  A shot made it go away. However pain returned about a week later. Had another shot R posterior hip which did not help.  R hip bursitis pain began around June 2019.  Pt then started having a tingly sensation R leg from knee to ankle all over.  Felt wierd.  Worsened as time progressed. Was given a muscle relaxer by Dr. Yves Dill.  Pt could not walk and had to hang onto furniture to move around.  Both legs got wobbly.  Took her tramadol for pain and both her legs gave way and fell right after she swollowed the  pill this past Semptember 2019.   Was fine the next day.  The insurance will not let her have and MRI until she does PT.       Patient Stated Goals  Improve flexibility    Currently in Pain?  Yes    Pain Score  2     Pain Onset  More than a month ago                               PT Education - 02/18/18 1402    Education provided  Yes    Education Details  ther-ex    [employer]) Educated  Patient    Methods  Explanation;Demonstration;Tactile cues;Verbal cues    Comprehension  Returned demonstration;Verbalized understanding         Objective    R upper trap tension    MedbridgeAccess Code: [email or web address]   Gait: L pelvic drop with R low back side bend during R LE stance phase.   Therapeutic exercise   Reclined position: uncomfortable per pt  BP L arm sitting, mechanically taken: 122/67, HR 59 Seated position: feet propped on Air Ex pad   Transversus abdominis contraction 10x5 seconds for 2 sets   Side stepping 5 ft to the L and 5 ft to the R with bilateral UE assist 5x. Glute med muscle use felt.   Forward wedding march 40 ft x 2. Glute med muscle use felt.   Standing bilateral shoulder low rows yellow 10x (easy)  Red 10x3  Pallof press resisting double yellow band in standing 5x5 seconds for 3 sets to promote trunk muscle strengthening.    Standing R hip abduction with B UE assist 5x3 each LE.   Seated glute max contraction 10x 5-10 second holds, then 5x10 second holds   Improved exercise technique, movement at target joints, use of target muscles after mod verbal, visual, tactile cues.   Continued working on gentle trunk and glute strengthening to promote lumbopelvic control with activities to help decrease pressure to low back. Decreased back pain to 0.5/10 in sitting rest after exercises. Patient will benefit from continued skilled physical therapy services to decrease pain, improve strength and function.        PT Short  Term Goals - 02/09/18 1635      PT SHORT TERM GOAL #1   Title  Patient will be independent with her HEP to promote ability to perform functional tasks more comfortably for  her back and R LE.     Baseline  Pt has started her HEP. (02/09/2018)    Time  3    Period  Weeks    Status  New    Target Date  02/11/18        PT Long Term Goals - 02/09/18 1628      PT LONG TERM GOAL #1   Title  Patient will have a decrease in R low back/ hip pain to 4/10 or less at worst to improve ability to perform standing tasks, improve ability to ambulate longer distances, tolerate sitting for longer periods.     Baseline  7/10 back/R hip pain at worst for the past 3 months (02/09/2018)    Time  6    Period  Weeks    Status  New    Target Date  03/25/18      PT LONG TERM GOAL #2   Title  Pt will improve her lumbar spine FOTO score by at least 10 points as a demonstration of improved function.     Baseline  Lumbar spine FOTO 53 (02/09/2018)    Time  6    Period  Weeks    Status  New    Target Date  03/25/18      PT LONG TERM GOAL #3   Title  Patient will improve bilateral hip abduction and extension strength by at least 1/2 MMT to promote ability to perform standing tasks with less back and R LE pain.     Time  6    Period  Weeks    Status  New    Target Date  03/25/18            Plan - 02/18/18 1402    Clinical Impression Statement  Continued working on gentle trunk and glute strengthening to promote lumbopelvic control with activities to help decrease pressure to low back. Decreased back pain to 0.5/10 in sitting rest after exercises. Patient will benefit from continued skilled physical therapy services to decrease pain, improve strength and function.    Rehab Potential  Fair    Clinical Impairments Affecting Rehab Potential  (-) comorbidities, hip weakness, age, back and R LE pain and symptoms; (+) motivated    PT Frequency  2x / week    PT Duration  6 weeks    PT Treatment/Interventions   Therapeutic activities;Therapeutic exercise;Neuromuscular re-education;Patient/family education;Manual techniques;Dry needling;Aquatic Therapy;Electrical Stimulation;Iontophoresis 4mg /ml Dexamethasone;Gait training;Stair training    PT Next Visit Plan  scapular, trunk, glute strengthening, thoracic extension, manual techniques, modalities PRN    Consulted and Agree with Plan of Care  Patient       Patient will benefit from skilled therapeutic intervention in order to improve the following deficits and impairments:  Pain, Postural dysfunction, Improper body mechanics, Difficulty walking, Decreased strength  Visit Diagnosis: Low back pain, unspecified back pain laterality, unspecified chronicity, unspecified whether sciatica present  Muscle weakness (generalized)  Pain in right hip     Problem List Patient Active Problem List   Diagnosis Date Noted  . Chronic cough 11/19/2016  . Morbid obesity (HCC) 09/26/2016  . Left ovarian cyst 05/30/2016  . S/P lumbar spinal fusion 12/13/2015  . Aortic calcification (HCC) 07/18/2015  . Controlled type 2 diabetes mellitus without complication (HCC) 05/16/2015  . Thrombocytopenia (HCC) 05/16/2015  . Recurrent major depressive disorder, in full remission (HCC) 05/16/2015  . Essential (primary) hypertension 05/16/2015  . Fatty infiltration of liver 03/15/2015  . Aortic valve stenosis, nonrheumatic  01/18/2015  . Sciatica of right side 01/09/2015  . Type 2 diabetes mellitus (HCC) 11/10/2014  . RLS (restless legs syndrome) 08/23/2014  . Neuritis or radiculitis due to rupture of lumbar intervertebral disc 06/13/2014  . Degeneration of intervertebral disc of lumbar region 06/13/2014  . Lumbar radiculitis 06/13/2014  . Arthritis 01/23/2014  . Arthritis of knee, degenerative 11/15/2013  . Depression 09/29/2013  . Insomnia 09/29/2013  . Apnea, sleep 08/29/2013  . Calculus of kidney 08/29/2013  . Abnormal presence of protein in urine 08/29/2013  .  Headache, migraine 08/29/2013  . BP (high blood pressure) 08/29/2013  . HLD (hyperlipidemia) 08/29/2013  . Allergic rhinitis 08/29/2013  . Intractable migraine without aura 02/18/2013    Loralyn Freshwater PT, DPT   02/18/2018, 2:39 PM  [place] [facility] PH[facility] SPORTS MEDICINE [address] [place], [address] Phone: [phone]   Fax:  [phone]  Name: NIKAELA COYNE MRN: [id number] Date of Birth: 01/23/1951

## 2018-02-19 ENCOUNTER — Other Ambulatory Visit: Payer: Self-pay | Admitting: Physical Medicine and Rehabilitation

## 2018-02-19 DIAGNOSIS — M5416 Radiculopathy, lumbar region: Secondary | ICD-10-CM

## 2018-02-23 ENCOUNTER — Ambulatory Visit: Payer: Medicare HMO

## 2018-02-23 ENCOUNTER — Other Ambulatory Visit: Payer: Self-pay | Admitting: Adult Health

## 2018-02-23 DIAGNOSIS — M6281 Muscle weakness (generalized): Secondary | ICD-10-CM

## 2018-02-23 DIAGNOSIS — M545 Low back pain, unspecified: Secondary | ICD-10-CM

## 2018-02-23 DIAGNOSIS — M25551 Pain in right hip: Secondary | ICD-10-CM

## 2018-02-23 NOTE — Therapy (Signed)
[facility]ORTS MEDICINE [address] [place], [address] Phone: [phone]   Fax:  [phone]  Physical Therapy Treatment  Patient Details  Name: KAMEELA LEIPOLD MRN: [id number] Date of Birth: 1950/11/08 Referring Provider (PT): Merri Ray, DO   Encounter Date: 02/23/2018  PT End of Session - 02/23/18 0947    Visit Number  5    Number of Visits  13    Date for PT Re-Evaluation  03/25/18    Authorization Type  5    Authorization Time Period  of 10 progress report    PT Start Time  0948    PT Stop Time  1031    PT Time Calculation (min)  43 min    Activity Tolerance  Patient tolerated treatment well    Behavior During Therapy  [facility] for tasks assessed/performed       Past Medical History:  Diagnosis Date  . Anxiety   . Colon polyps   . Degenerative arthritis   . Depression   . Diabetes mellitus without complication (HCC)    Type II  . Environmental allergies   . Family history of adverse reaction to anesthesia    sister- PONV  . Fatty liver   . Glaucoma   . Headache(784.0)    HX  MIGRAINES  . History of bronchitis   . History of kidney stones   . History of pneumonia   . Hypertension   . Hypothyroidism   . Obesity   . OSA on CPAP   . Plantar fasciitis    right  . PONV (postoperative nausea and vomiting)   . Renal calculi   . Renal calculi   . RLS (restless legs syndrome) 08/23/2014    Past Surgical History:  Procedure Laterality Date  . ABDOMINAL HYSTERECTOMY     partial  . APPENDECTOMY    . Arthroscopic surgery, knee Left   . BREAST BIOPSY Right    several  . BREAST BIOPSY Right 03/11/2017   u/s bx neg  . BUNIONECTOMY     LEFT   . COLONOSCOPY W/ POLYPECTOMY    . COLONOSCOPY WITH PROPOFOL N/A 08/14/2017   Procedure: COLONOSCOPY WITH PROPOFOL;  Surgeon: Christena Deem, MD;  Location: [facility] ENDOSCOPY;  Service: Endoscopy;  Laterality: N/A;  . EXTRACORPOREAL SHOCK WAVE LITHOTRIPSY  Left 05/14/2017   Procedure: EXTRACORPOREAL SHOCK WAVE LITHOTRIPSY (ESWL);  Surgeon: Riki Altes, MD;  Location: [facility] ORS;  Service: Urology;  Laterality: Left;  . FOOT SURGERY     RIGHT    . KNEE ARTHROSCOPY W/ MENISCAL REPAIR Left   . LASIK    . MAXIMUM ACCESS (MAS)POSTERIOR LUMBAR INTERBODY FUSION (PLIF) 1 LEVEL N/A 04/25/2016   Procedure: LUMBAR FOUR-FIVE  MAXIMUM ACCESS (MAS) POSTERIOR LUMBAR INTERBODY FUSION (PLIF) with extension of instrumentation LUMBAR TWO-FIVE;  Surgeon: Tia Alert, MD;  Location: [facility] OR;  Service: Neurosurgery;  Laterality: N/A;  . MAXIMUM ACCESS (MAS)POSTERIOR LUMBAR INTERBODY FUSION (PLIF) 2 LEVEL N/A 12/13/2015   Procedure: Lumbar two-three - Lumbar three-four MAXIMUM ACCESS (MAS) POSTERIOR LUMBAR INTERBODY FUSION (PLIF)  ;  Surgeon: Tia Alert, MD;  Location: [facility] NEURO ORS;  Service: Neurosurgery;  Laterality: N/A;  . SHOULDER SURGERY     RIGHT   . TONSILLECTOMY      There were no vitals filed for this visit.  Subjective Assessment - 02/23/18 0950    Subjective  Back is not good. 9/10 currently. The restless legs are increasing. Has symptoms  in L leg. Noticed odd feeling and restless legs at 5 am this morning. Started this morning. Feeling sick to her stomach. Brought a towel just in case she does not make it to the [place] to throw up.  Was pretty good after last session until earlier this morning.  Pt states that her driving was a little odd this morning like she wanted to go to sleep. Got about 5-6 hours of sleep. Took gabapentin at night. Has not thrown up yet. Pt states having a headache. Getting and MRI for her back 03/12/2018       Pertinent History  Low back pain R > L. Pt initally had bursitis R posterior hip at ischial tuberosity area.  A shot made it go away. However pain returned about a week later. Had another shot R posterior hip which did not help.  R hip bursitis pain began around June 2019.  Pt then started having a tingly sensation R leg  from knee to ankle all over.  Felt wierd.  Worsened as time progressed. Was given a muscle relaxer by Dr. Yves Dill.  Pt could not walk and had to hang onto furniture to move around.  Both legs got wobbly.  Took her tramadol for pain and both her legs gave way and fell right after she swollowed the pill this past Semptember 2019.   Was fine the next day.  The insurance will not let her have and MRI until she does PT.       Patient Stated Goals  Improve flexibility    Currently in Pain?  Yes    Pain Score  9     Pain Onset  More than a month ago                               PT Education - 02/23/18 1018    Education provided  Yes    Education Details  ther-ex    [employer]) Educated  Patient    Methods  Explanation;Demonstration;Tactile cues;Verbal cues    Comprehension  Returned demonstration;Verbalized understanding      Objective      MedbridgeAccess Code: [email or web address]   Gait: L pelvic drop with R low back side bend during R LE stance phase.   Therapeutic exercise  Blood pressure mechanically taken, L arm sitting, normal cuff. 151/72, HR 58 Temperature, mechanically taken. 98.0  Sitting with upright posture  Gentle manual perturbation from PT 30 seconds x 3  Gentle trunk flexion isometrics 10x5 seconds   Side stepping 5 ft to the L and 5 ft to the R with bilateral UE assist 5x. Glute med muscle use felt.   Standing glute max squeeze 5x. Increased discomfort.   Ice towards end of session in sitting, x 15 minutes. Pt states ice helps her back feel better.    During ice   Seated gentle glute max squeeze 5x3    Seated gentle L trunk side bend position1x   Seated gentle trunk flexion position 1x   No change in symptoms.    Improved exercise technique, movement at target joints, use of target muscles after min to mod verbal, visual, tactile cues.    Performed gentle exercises today secondary to irritability to back pain and R LE symptoms.  Vitals within acceptable range for PT. Utilized ice towards end of session for pain control. Decreased back pain to 5/10 at end of session.       PT Short  Term Goals - 02/09/18 1635      PT SHORT TERM GOAL #1   Title  Patient will be independent with her HEP to promote ability to perform functional tasks more comfortably for her back and R LE.     Baseline  Pt has started her HEP. (02/09/2018)    Time  3    Period  Weeks    Status  New    Target Date  02/11/18        PT Long Term Goals - 02/09/18 1628      PT LONG TERM GOAL #1   Title  Patient will have a decrease in R low back/ hip pain to 4/10 or less at worst to improve ability to perform standing tasks, improve ability to ambulate longer distances, tolerate sitting for longer periods.     Baseline  7/10 back/R hip pain at worst for the past 3 months (02/09/2018)    Time  6    Period  Weeks    Status  New    Target Date  03/25/18      PT LONG TERM GOAL #2   Title  Pt will improve her lumbar spine FOTO score by at least 10 points as a demonstration of improved function.     Baseline  Lumbar spine FOTO 53 (02/09/2018)    Time  6    Period  Weeks    Status  New    Target Date  03/25/18      PT LONG TERM GOAL #3   Title  Patient will improve bilateral hip abduction and extension strength by at least 1/2 MMT to promote ability to perform standing tasks with less back and R LE pain.     Time  6    Period  Weeks    Status  New    Target Date  03/25/18            Plan - 02/23/18 1019    Clinical Impression Statement  Performed gentle exercises today secondary to irritability to back pain and R LE symptoms. Vitals within acceptable range for PT. Utilized ice towards end of session for pain control. Decreased back pain to 5/10 at end of session.     Rehab Potential  Fair    Clinical Impairments Affecting Rehab Potential  (-) comorbidities, hip weakness, age, back and R LE pain and symptoms; (+) motivated    PT Frequency   2x / week    PT Duration  6 weeks    PT Treatment/Interventions  Therapeutic activities;Therapeutic exercise;Neuromuscular re-education;Patient/family education;Manual techniques;Dry needling;Aquatic Therapy;Electrical Stimulation;Iontophoresis 4mg /ml Dexamethasone;Gait training;Stair training    PT Next Visit Plan  scapular, trunk, glute strengthening, thoracic extension, manual techniques, modalities PRN    Consulted and Agree with Plan of Care  Patient       Patient will benefit from skilled therapeutic intervention in order to improve the following deficits and impairments:  Pain, Postural dysfunction, Improper body mechanics, Difficulty walking, Decreased strength  Visit Diagnosis: Low back pain, unspecified back pain laterality, unspecified chronicity, unspecified whether sciatica present  Muscle weakness (generalized)  Pain in right hip     Problem List Patient Active Problem List   Diagnosis Date Noted  . Chronic cough 11/19/2016  . Morbid obesity (HCC) 09/26/2016  . Left ovarian cyst 05/30/2016  . S/P lumbar spinal fusion 12/13/2015  . Aortic calcification (HCC) 07/18/2015  . Controlled type 2 diabetes mellitus without complication (HCC) 05/16/2015  . Thrombocytopenia (HCC) 05/16/2015  .  Recurrent major depressive disorder, in full remission (HCC) 05/16/2015  . Essential (primary) hypertension 05/16/2015  . Fatty infiltration of liver 03/15/2015  . Aortic valve stenosis, nonrheumatic 01/18/2015  . Sciatica of right side 01/09/2015  . Type 2 diabetes mellitus (HCC) 11/10/2014  . RLS (restless legs syndrome) 08/23/2014  . Neuritis or radiculitis due to rupture of lumbar intervertebral disc 06/13/2014  . Degeneration of intervertebral disc of lumbar region 06/13/2014  . Lumbar radiculitis 06/13/2014  . Arthritis 01/23/2014  . Arthritis of knee, degenerative 11/15/2013  . Depression 09/29/2013  . Insomnia 09/29/2013  . Apnea, sleep 08/29/2013  . Calculus of kidney  08/29/2013  . Abnormal presence of protein in urine 08/29/2013  . Headache, migraine 08/29/2013  . BP (high blood pressure) 08/29/2013  . HLD (hyperlipidemia) 08/29/2013  . Allergic rhinitis 08/29/2013  . Intractable migraine without aura 02/18/2013    Loralyn Freshwater PT, DPT   02/23/2018, 12:19 PM  [facility] PHYSICAL AND SPORTS MEDICINE [address], [place], [address] Phone: [phone]   Fax:  [phone]  Name: CRESTINA STRIKE MRN: [id number] Date of Birth: 1951-02-27

## 2018-03-03 ENCOUNTER — Ambulatory Visit: Payer: Medicare HMO

## 2018-03-03 DIAGNOSIS — M6281 Muscle weakness (generalized): Secondary | ICD-10-CM

## 2018-03-03 DIAGNOSIS — M25551 Pain in right hip: Secondary | ICD-10-CM

## 2018-03-03 DIAGNOSIS — M545 Low back pain, unspecified: Secondary | ICD-10-CM

## 2018-03-03 NOTE — Patient Instructions (Addendum)
Standing glute max squeeze  Standing and holding onto something sturdy for support   Even distribution of weight on feet   Gently tighten your belly button and squeeze your rear end muscles   (pain free level of effort)   Hold for 5 seconds   Repeat 10 times   Perform at least 3 sets daily.     Standing alternating toe taps  Standing in front of your kitchen counter,    Tighten your belly button   Gently tap your foot about 3 inches above the floor   Emphasis on keeping your pelvis level   Soft landing with your foot   Repeat 5 times   Perform 3 sets daily.

## 2018-03-03 NOTE — Therapy (Signed)
[place] [facility] PHYSICAL AND SPORTS MEDICINE [address], [place], [address] Phone: [phone]   Fax:  [phone]  Physical Therapy Treatment  Patient Details  Name: Jessica Cole MRN: [id number] Date of Birth: December 20, 1950 Referring Provider (PT): Merri Ray, DO   Encounter Date: 03/03/2018  PT End of Session - 03/03/18 0948    Visit Number  6    Number of Visits  13    Date for PT Re-Evaluation  03/25/18    Authorization Type  6    Authorization Time Period  of 10 progress report    PT Start Time  0948    PT Stop Time  1031    PT Time Calculation (min)  43 min    Activity Tolerance  Patient tolerated treatment well    Behavior During Therapy  [facility] for tasks assessed/performed       Past Medical History:  Diagnosis Date  . Anxiety   . Colon polyps   . Degenerative arthritis   . Depression   . Diabetes mellitus without complication (HCC)    Type II  . Environmental allergies   . Family history of adverse reaction to anesthesia    sister- PONV  . Fatty liver   . Glaucoma   . Headache(784.0)    HX  MIGRAINES  . History of bronchitis   . History of kidney stones   . History of pneumonia   . Hypertension   . Hypothyroidism   . Obesity   . OSA on CPAP   . Plantar fasciitis    right  . PONV (postoperative nausea and vomiting)   . Renal calculi   . Renal calculi   . RLS (restless legs syndrome) 08/23/2014    Past Surgical History:  Procedure Laterality Date  . ABDOMINAL HYSTERECTOMY     partial  . APPENDECTOMY    . Arthroscopic surgery, knee Left   . BREAST BIOPSY Right    several  . BREAST BIOPSY Right 03/11/2017   u/s bx neg  . BUNIONECTOMY     LEFT   . COLONOSCOPY W/ POLYPECTOMY    . COLONOSCOPY WITH PROPOFOL N/A 08/14/2017   Procedure: COLONOSCOPY WITH PROPOFOL;  Surgeon: Christena Deem, MD;  Location: [facility] ENDOSCOPY;  Service: Endoscopy;  Laterality: N/A;  . EXTRACORPOREAL SHOCK WAVE LITHOTRIPSY  Left 05/14/2017   Procedure: EXTRACORPOREAL SHOCK WAVE LITHOTRIPSY (ESWL);  Surgeon: Riki Altes, MD;  Location: [facility] ORS;  Service: Urology;  Laterality: Left;  . FOOT SURGERY     RIGHT    . KNEE ARTHROSCOPY W/ MENISCAL REPAIR Left   . LASIK    . MAXIMUM ACCESS (MAS)POSTERIOR LUMBAR INTERBODY FUSION (PLIF) 1 LEVEL N/A 04/25/2016   Procedure: LUMBAR FOUR-FIVE  MAXIMUM ACCESS (MAS) POSTERIOR LUMBAR INTERBODY FUSION (PLIF) with extension of instrumentation LUMBAR TWO-FIVE;  Surgeon: Tia Alert, MD;  Location: [facility] OR;  Service: Neurosurgery;  Laterality: N/A;  . MAXIMUM ACCESS (MAS)POSTERIOR LUMBAR INTERBODY FUSION (PLIF) 2 LEVEL N/A 12/13/2015   Procedure: Lumbar two-three - Lumbar three-four MAXIMUM ACCESS (MAS) POSTERIOR LUMBAR INTERBODY FUSION (PLIF)  ;  Surgeon: Tia Alert, MD;  Location: [facility] NEURO ORS;  Service: Neurosurgery;  Laterality: N/A;  . SHOULDER SURGERY     RIGHT   . TONSILLECTOMY      There were no vitals filed for this visit.  Subjective Assessment - 03/03/18 0950    Subjective  Back pain is off and on. Right now its not too bad. Bothered  her the other day (around Monday). Does not know what increased her pain. 2/10 currently.  Back was better after last session.  Did a cake for her niece, walked up and down her back steps (7 steps, the top step is steep, has bilateral rails), walking the incline in her yard tends to bother her.   The front door just has a little step to enter the house.  Stepping up the step into the house at the front bothers her back. Has to climb up an inline to get to the front door which bothers her back.  It's not a steep incline, its about 42 ft)    Pertinent History  Low back pain R > L. Pt initally had bursitis R posterior hip at ischial tuberosity area.  A shot made it go away. However pain returned about a week later. Had another shot R posterior hip which did not help.  R hip bursitis pain began around June 2019.  Pt then started having a tingly  sensation R leg from knee to ankle all over.  Felt wierd.  Worsened as time progressed. Was given a muscle relaxer by Dr. Yves Dill.  Pt could not walk and had to hang onto furniture to move around.  Both legs got wobbly.  Took her tramadol for pain and both her legs gave way and fell right after she swollowed the pill this past Semptember 2019.   Was fine the next day.  The insurance will not let her have and MRI until she does PT.       Patient Stated Goals  Improve flexibility    Currently in Pain?  Yes    Pain Score  2     Pain Onset  More than a month ago                               PT Education - 03/03/18 1241    Education provided  Yes    Education Details  ther-ex, HEP    Person(s) Educated  Patient    Methods  Explanation;Demonstration;Tactile cues;Verbal cues;Handout    Comprehension  Returned demonstration;Verbalized understanding      Objective    R upper trap tension     MedbridgeAccess Code: [email or web address]  Gait: L pelvic drop with R low back side bend during R LE stance phase.   Incline to get to front door is roughly about 42 ft, going up the incline bothers her more than going down. Does not bother as much compared to going the back steps  Per pt sujective, going up and down steps tend to increase pain   Therapeutic exercise Pt was recommended to [place] the front entrance instead of the back to enter and exit her home since it does not bother as much compared to using the back entrance with the 7 steps and the steep top step. Pt verbalized understanding.   Seated position: feet propped on Air Ex pad  Standing with B UE assist  Hip abduction, pain free range  R 10x2  L 10x2   Standing glute max squeeze 10x5 seconds for 2 sets   Standing alternating heel raise with emphasis on level pelvis 10x pain free level of movement   Standing glute max squeeze with even LE weight distribution with transversus abdominis contraction 10x5  seconds x 2. Eased off symptoms.   Standing alternating toe taps onto treadmill platform 5x3 with B UE Assist, emphasis on  level pelvis.   Reviewed HEP. Pt demonstrated and verbalized understanding.    Improved exercise technique, movement at target joints, use of target muscles after mod verbal, visual, tactile cues.    Stairs and inclines tend to reaggravate symptoms at home based on subjective reports following PT. Worked on trunk, glute strengthening as well as lumbopelvic control when picking up her feet to promote ability to go up and down inclines and stairs with less pelvic drop and hopefully decrease back pain at home. Pt will benefit from continued skilled physical therapy services to decrease back pain, improve strength and function.          PT Short Term Goals - 02/09/18 1635      PT SHORT TERM GOAL #1   Title  Patient will be independent with her HEP to promote ability to perform functional tasks more comfortably for her back and R LE.     Baseline  Pt has started her HEP. (02/09/2018)    Time  3    Period  Weeks    Status  New    Target Date  02/11/18        PT Long Term Goals - 02/09/18 1628      PT LONG TERM GOAL #1   Title  Patient will have a decrease in R low back/ hip pain to 4/10 or less at worst to improve ability to perform standing tasks, improve ability to ambulate longer distances, tolerate sitting for longer periods.     Baseline  7/10 back/R hip pain at worst for the past 3 months (02/09/2018)    Time  6    Period  Weeks    Status  New    Target Date  03/25/18      PT LONG TERM GOAL #2   Title  Pt will improve her lumbar spine FOTO score by at least 10 points as a demonstration of improved function.     Baseline  Lumbar spine FOTO 53 (02/09/2018)    Time  6    Period  Weeks    Status  New    Target Date  03/25/18      PT LONG TERM GOAL #3   Title  Patient will improve bilateral hip abduction and extension strength by at least 1/2 MMT to  promote ability to perform standing tasks with less back and R LE pain.     Time  6    Period  Weeks    Status  New    Target Date  03/25/18            Plan - 03/03/18 1610    Clinical Impression Statement  Stairs and inclines tend to reaggravate symptoms at home based on subjective reports following PT. Worked on trunk, glute strengthening as well as lumbopelvic control when picking up her feet to promote ability to go up and down inclines and stairs with less pelvic drop and hopefully decrease back pain at home. Pt will benefit from continued skilled physical therapy services to decrease back pain, improve strength and function.     Rehab Potential  Fair    Clinical Impairments Affecting Rehab Potential  (-) comorbidities, hip weakness, age, back and R LE pain and symptoms; (+) motivated    PT Frequency  2x / week    PT Duration  6 weeks    PT Treatment/Interventions  Therapeutic activities;Therapeutic exercise;Neuromuscular re-education;Patient/family education;Manual techniques;Dry needling;Aquatic Therapy;Electrical Stimulation;Iontophoresis 4mg /ml Dexamethasone;Gait training;Stair training    PT Next Visit  Plan  scapular, trunk, glute strengthening, thoracic extension, manual techniques, modalities PRN    Consulted and Agree with Plan of Care  Patient       Patient will benefit from skilled therapeutic intervention in order to improve the following deficits and impairments:  Pain, Postural dysfunction, Improper body mechanics, Difficulty walking, Decreased strength  Visit Diagnosis: Low back pain, unspecified back pain laterality, unspecified chronicity, unspecified whether sciatica present  Muscle weakness (generalized)  Pain in right hip     Problem List Patient Active Problem List   Diagnosis Date Noted  . Chronic cough 11/19/2016  . Morbid obesity (HCC) 09/26/2016  . Left ovarian cyst 05/30/2016  . S/P lumbar spinal fusion 12/13/2015  . Aortic calcification (HCC)  07/18/2015  . Controlled type 2 diabetes mellitus without complication (HCC) 05/16/2015  . Thrombocytopenia (HCC) 05/16/2015  . Recurrent major depressive disorder, in full remission (HCC) 05/16/2015  . Essential (primary) hypertension 05/16/2015  . Fatty infiltration of liver 03/15/2015  . Aortic valve stenosis, nonrheumatic 01/18/2015  . Sciatica of right side 01/09/2015  . Type 2 diabetes mellitus (HCC) 11/10/2014  . RLS (restless legs syndrome) 08/23/2014  . Neuritis or radiculitis due to rupture of lumbar intervertebral disc 06/13/2014  . Degeneration of intervertebral disc of lumbar region 06/13/2014  . Lumbar radiculitis 06/13/2014  . Arthritis 01/23/2014  . Arthritis of knee, degenerative 11/15/2013  . Depression 09/29/2013  . Insomnia 09/29/2013  . Apnea, sleep 08/29/2013  . Calculus of kidney 08/29/2013  . Abnormal presence of protein in urine 08/29/2013  . Headache, migraine 08/29/2013  . BP (high blood pressure) 08/29/2013  . HLD (hyperlipidemia) 08/29/2013  . Allergic rhinitis 08/29/2013  . Intractable migraine without aura 02/18/2013    Loralyn Freshwater PT, DPT   03/03/2018, 12:52 PM  [place] [facility] PHYSICAL AND SPORTS MEDICINE [address], [place], [address] Phone: [phone]   Fax:  [phone]  Name: AVIE CHECO MRN: [id number] Date of Birth: June 01, 1950

## 2018-03-09 ENCOUNTER — Ambulatory Visit: Payer: Medicare HMO | Attending: Physical Medicine and Rehabilitation

## 2018-03-09 DIAGNOSIS — M6281 Muscle weakness (generalized): Secondary | ICD-10-CM

## 2018-03-09 DIAGNOSIS — M545 Low back pain, unspecified: Secondary | ICD-10-CM

## 2018-03-09 DIAGNOSIS — M25551 Pain in right hip: Secondary | ICD-10-CM

## 2018-03-09 NOTE — Therapy (Signed)
[facility] PHYS[facility] SPORTS MEDICINE [address], [place], [address] Phone: [phone]   Fax:  [phone]  Physical Therapy Treatment  Patient Details  Name: Jessica Cole MRN: [id number] Date of Birth: 05-12-1950 Referring Provider (PT): Merri Ray, DO   Encounter Date: 03/09/2018  PT End of Session - 03/09/18 1352    Visit Number  7    Number of Visits  13    Date for PT Re-Evaluation  03/25/18    Authorization Type  7    Authorization Time Period  of 10 progress report    PT Start Time  1352    PT Stop Time  1436    PT Time Calculation (min)  44 min    Activity Tolerance  Patient tolerated treatment well    Behavior During Therapy  [facility] for tasks assessed/performed       Past Medical History:  Diagnosis Date  . Anxiety   . Colon polyps   . Degenerative arthritis   . Depression   . Diabetes mellitus without complication (HCC)    Type II  . Environmental allergies   . Family history of adverse reaction to anesthesia    sister- PONV  . Fatty liver   . Glaucoma   . Headache(784.0)    HX  MIGRAINES  . History of bronchitis   . History of kidney stones   . History of pneumonia   . Hypertension   . Hypothyroidism   . Obesity   . OSA on CPAP   . Plantar fasciitis    right  . PONV (postoperative nausea and vomiting)   . Renal calculi   . Renal calculi   . RLS (restless legs syndrome) 08/23/2014    Past Surgical History:  Procedure Laterality Date  . ABDOMINAL HYSTERECTOMY     partial  . APPENDECTOMY    . Arthroscopic surgery, knee Left   . BREAST BIOPSY Right    several  . BREAST BIOPSY Right 03/11/2017   u/s bx neg  . BUNIONECTOMY     LEFT   . COLONOSCOPY W/ POLYPECTOMY    . COLONOSCOPY WITH PROPOFOL N/A 08/14/2017   Procedure: COLONOSCOPY WITH PROPOFOL;  Surgeon: Christena Deem, MD;  Location: [facility] ENDOSCOPY;  Service: Endoscopy;  Laterality: N/A;  . EXTRACORPOREAL SHOCK WAVE LITHOTRIPSY  Left 05/14/2017   Procedure: EXTRACORPOREAL SHOCK WAVE LITHOTRIPSY (ESWL);  Surgeon: Riki Altes, MD;  Location: [facility] ORS;  Service: Urology;  Laterality: Left;  . FOOT SURGERY     RIGHT    . KNEE ARTHROSCOPY W/ MENISCAL REPAIR Left   . LASIK    . MAXIMUM ACCESS (MAS)POSTERIOR LUMBAR INTERBODY FUSION (PLIF) 1 LEVEL N/A 04/25/2016   Procedure: LUMBAR FOUR-FIVE  MAXIMUM ACCESS (MAS) POSTERIOR LUMBAR INTERBODY FUSION (PLIF) with extension of instrumentation LUMBAR TWO-FIVE;  Surgeon: Tia Alert, MD;  Location: [facility] OR;  Service: Neurosurgery;  Laterality: N/A;  . MAXIMUM ACCESS (MAS)POSTERIOR LUMBAR INTERBODY FUSION (PLIF) 2 LEVEL N/A 12/13/2015   Procedure: Lumbar two-three - Lumbar three-four MAXIMUM ACCESS (MAS) POSTERIOR LUMBAR INTERBODY FUSION (PLIF)  ;  Surgeon: Tia Alert, MD;  Location: [facility] NEURO ORS;  Service: Neurosurgery;  Laterality: N/A;  . SHOULDER SURGERY     RIGHT   . TONSILLECTOMY      There were no vitals filed for this visit.  Subjective Assessment - 03/09/18 1354    Subjective  Back is not bothering her today. Just her R lateral trunk and groin  area and R lateral thigh (L5 dermatome area). Felt funny sensation from her B knees to her ankles this morning (B L5 dermatome area).     9/10 R lateral trunk pain currently.  Has not had a pain pill since Saturday (aout 3 days ago).  Tries not to take pain pill every day.  Not too bad last session and the day after it.     Pertinent History  Low back pain R > L. Pt initally had bursitis R posterior hip at ischial tuberosity area.  A shot made it go away. However pain returned about a week later. Had another shot R posterior hip which did not help.  R hip bursitis pain began around June 2019.  Pt then started having a tingly sensation R leg from knee to ankle all over.  Felt wierd.  Worsened as time progressed. Was given a muscle relaxer by Dr. Yves Dill.  Pt could not walk and had to hang onto furniture to move around.  Both legs got  wobbly.  Took her tramadol for pain and both her legs gave way and fell right after she swollowed the pill this past Semptember 2019.   Was fine the next day.  The insurance will not let her have and MRI until she does PT.       Patient Stated Goals  Improve flexibility    Currently in Pain?  Yes    Pain Score  9    R lateral trunk   Pain Onset  More than a month ago                               PT Education - 03/09/18 1407    Education provided  Yes    Education Details  ther-ex    [employer]) Educated  Patient    Methods  Explanation;Demonstration;Tactile cues;Verbal cues    Comprehension  Returned demonstration;Verbalized understanding      Objective    R upper trap tension     MedbridgeAccess Code: [email or web address]  Gait: L pelvic drop with R low back side bend during R LE stance phase.   Therapeutic exercise    Seated position: feet propped on Air Ex pad or 4 inch step   Seated gentle L trunk side bend 5x5 seconds for 2 sets. No change in pain afterwards  Sitting with upright posture             Gentle manual perturbation from PT 30 seconds x 3   Transversus abdominis activation 10x5 seconds              Gentle trunk flexion isometrics 10x5 seconds for 2 sets   Pain returns when pt stops activating trunk muscles.     B scapular retraction 10x3   Standing straight pallof press resisting double yellow band 10x3    Standing with B UE assist             Hip abduction, pain free range             R 10x             L 10x  Standing regular pallof press resisting double yellow band   Resistance on L side 5x6. Good tolerance for back   Resistance on R side 5x. Discomfort  Standing glute max squeeze 10x5 seconds for 2 sets    Standing alternating heel raise with emphasis on level pelvis 10x pain free level  of movement  Slight increase in discomfort.   Standing regular pallof press resisting double yellow band (again)    resistance on L side 6x5.   No R low back pain/lateral trunk pain, about 0.5/10 L low back discomfort after session.   Give as part of HEP next visit if appropriate  Improved exercise technique, movement at target joints, use of target muscles after min to mod verbal, visual, tactile cues.    Back pain seems to decrease with gentle trunk muscle activation as well as with gentle exercise resisting R trunk rotation (pallof press with resistance on L side). No R low back/lateral trunk pain and about 0.5/10 L low back pain/discomfort after session. Pt states back feels better after treatment. Pt observed to ambulate with more ease. Pt will benefit from continued skilled physical therapy to improve trunk strength, lumbopelvic control, decrease pain, and improve function.            PT Short Term Goals - 02/09/18 1635      PT SHORT TERM GOAL #1   Title  Patient will be independent with her HEP to promote ability to perform functional tasks more comfortably for her back and R LE.     Baseline  Pt has started her HEP. (02/09/2018)    Time  3    Period  Weeks    Status  New    Target Date  02/11/18        PT Long Term Goals - 02/09/18 1628      PT LONG TERM GOAL #1   Title  Patient will have a decrease in R low back/ hip pain to 4/10 or less at worst to improve ability to perform standing tasks, improve ability to ambulate longer distances, tolerate sitting for longer periods.     Baseline  7/10 back/R hip pain at worst for the past 3 months (02/09/2018)    Time  6    Period  Weeks    Status  New    Target Date  03/25/18      PT LONG TERM GOAL #2   Title  Pt will improve her lumbar spine FOTO score by at least 10 points as a demonstration of improved function.     Baseline  Lumbar spine FOTO 53 (02/09/2018)    Time  6    Period  Weeks    Status  New    Target Date  03/25/18      PT LONG TERM GOAL #3   Title  Patient will improve bilateral hip abduction and extension strength by  at least 1/2 MMT to promote ability to perform standing tasks with less back and R LE pain.     Time  6    Period  Weeks    Status  New    Target Date  03/25/18            Plan - 03/09/18 1406    Clinical Impression Statement  Back pain seems to decrease with gentle trunk muscle activation as well as with gentle exercise resisting R trunk rotation (pallof press with resistance on L side). No R low back/lateral trunk pain and about 0.5/10 L low back pain/discomfort after session. Pt states back feels better after treatment. Pt observed to ambulate with more ease. Pt will benefit from continued skilled physical therapy to improve trunk strength, lumbopelvic control, decrease pain, and improve function.      Rehab Potential  Fair    Clinical Impairments Affecting Rehab  Potential  (-) comorbidities, hip weakness, age, back and R LE pain and symptoms; (+) motivated    PT Frequency  2x / week    PT Duration  6 weeks    PT Treatment/Interventions  Therapeutic activities;Therapeutic exercise;Neuromuscular re-education;Patient/family education;Manual techniques;Dry needling;Aquatic Therapy;Electrical Stimulation;Iontophoresis 4mg /ml Dexamethasone;Gait training;Stair training    PT Next Visit Plan  scapular, trunk, glute strengthening, thoracic extension, manual techniques, modalities PRN    Consulted and Agree with Plan of Care  Patient       Patient will benefit from skilled therapeutic intervention in order to improve the following deficits and impairments:  Pain, Postural dysfunction, Improper body mechanics, Difficulty walking, Decreased strength  Visit Diagnosis: Low back pain, unspecified back pain laterality, unspecified chronicity, unspecified whether sciatica present  Muscle weakness (generalized)  Pain in right hip     Problem List Patient Active Problem List   Diagnosis Date Noted  . Chronic cough 11/19/2016  . Morbid obesity (HCC) 09/26/2016  . Left ovarian cyst  05/30/2016  . S/P lumbar spinal fusion 12/13/2015  . Aortic calcification (HCC) 07/18/2015  . Controlled type 2 diabetes mellitus without complication (HCC) 05/16/2015  . Thrombocytopenia (HCC) 05/16/2015  . Recurrent major depressive disorder, in full remission (HCC) 05/16/2015  . Essential (primary) hypertension 05/16/2015  . Fatty infiltration of liver 03/15/2015  . Aortic valve stenosis, nonrheumatic 01/18/2015  . Sciatica of right side 01/09/2015  . Type 2 diabetes mellitus (HCC) 11/10/2014  . RLS (restless legs syndrome) 08/23/2014  . Neuritis or radiculitis due to rupture of lumbar intervertebral disc 06/13/2014  . Degeneration of intervertebral disc of lumbar region 06/13/2014  . Lumbar radiculitis 06/13/2014  . Arthritis 01/23/2014  . Arthritis of knee, degenerative 11/15/2013  . Depression 09/29/2013  . Insomnia 09/29/2013  . Apnea, sleep 08/29/2013  . Calculus of kidney 08/29/2013  . Abnormal presence of protein in urine 08/29/2013  . Headache, migraine 08/29/2013  . BP (high blood pressure) 08/29/2013  . HLD (hyperlipidemia) 08/29/2013  . Allergic rhinitis 08/29/2013  . Intractable migraine without aura 02/18/2013    Loralyn Freshwater PT, DPT   03/09/2018, 2:46 PM  [place] [facility] PHYSICAL AND SPORTS MEDICINE [address], [place], [address] Phone: [phone]   Fax:  [phone]  Name: Jessica Cole MRN: [id number] Date of Birth: 16-Apr-1951

## 2018-03-11 ENCOUNTER — Ambulatory Visit: Payer: Medicare HMO

## 2018-03-11 DIAGNOSIS — M545 Low back pain, unspecified: Secondary | ICD-10-CM

## 2018-03-11 DIAGNOSIS — M6281 Muscle weakness (generalized): Secondary | ICD-10-CM

## 2018-03-11 DIAGNOSIS — M25551 Pain in right hip: Secondary | ICD-10-CM

## 2018-03-11 NOTE — Therapy (Signed)
[place] [facility] [address] [place], [address] Phone: [phone]   Fax:  [phone]  Physical Therapy Treatment  Patient Details  Name: Jessica Cole MRN: [id number] Date of Birth: 1950-09-26 Referring Provider (PT): Merri Ray, DO   Encounter Date: 03/11/2018  PT End of Session - 03/11/18 0817    Visit Number  8    Number of Visits  13    Date for PT Re-Evaluation  03/25/18    Authorization Type  8    Authorization Time Period  of 10 progress report    PT Start Time  0817    PT Stop Time  0901    PT Time Calculation (min)  44 min    Activity Tolerance  Patient tolerated treatment well    Behavior During Therapy  [facility] for tasks assessed/performed       Past Medical History:  Diagnosis Date  . Anxiety   . Colon polyps   . Degenerative arthritis   . Depression   . Diabetes mellitus without complication (HCC)    Type II  . Environmental allergies   . Family history of adverse reaction to anesthesia    sister- PONV  . Fatty liver   . Glaucoma   . Headache(784.0)    HX  MIGRAINES  . History of bronchitis   . History of kidney stones   . History of pneumonia   . Hypertension   . Hypothyroidism   . Obesity   . OSA on CPAP   . Plantar fasciitis    right  . PONV (postoperative nausea and vomiting)   . Renal calculi   . Renal calculi   . RLS (restless legs syndrome) 08/23/2014    Past Surgical History:  Procedure Laterality Date  . ABDOMINAL HYSTERECTOMY     partial  . APPENDECTOMY    . Arthroscopic surgery, knee Left   . BREAST BIOPSY Right    several  . BREAST BIOPSY Right 03/11/2017   u/s bx neg  . BUNIONECTOMY     LEFT   . COLONOSCOPY W/ POLYPECTOMY    . COLONOSCOPY WITH PROPOFOL N/A 08/14/2017   Procedure: COLONOSCOPY WITH PROPOFOL;  Surgeon: Christena Deem, MD;  Location[facility] ENDOSCOPY;  Service: Endoscopy;  Laterality: N/A;  . EXTRACORPOREAL SHOCK WAVE LITHOTRIPSY  Left 05/14/2017   Procedure: EXTRACORPOREAL SHOCK WAVE LITHOTRIPSY (ESWL);  Surgeon: Riki Altes, MD;  Location: [facility] ORS;  Service: Urology;  Laterality: Left;  . FOOT SURGERY     RIGHT    . KNEE ARTHROSCOPY W/ MENISCAL REPAIR Left   . LASIK    . MAXIMUM ACCESS (MAS)POSTERIOR LUMBAR INTERBODY FUSION (PLIF) 1 LEVEL N/A 04/25/2016   Procedure: LUMBAR FOUR-FIVE  MAXIMUM ACCESS (MAS) POSTERIOR LUMBAR INTERBODY FUSION (PLIF) with extension of instrumentation LUMBAR TWO-FIVE;  Surgeon: Tia Alert, MD;  Location: [facility] OR;  Service: Neurosurgery;  Laterality: N/A;  . MAXIMUM ACCESS (MAS)POSTERIOR LUMBAR INTERBODY FUSION (PLIF) 2 LEVEL N/A 12/13/2015   Procedure: Lumbar two-three - Lumbar three-four MAXIMUM ACCESS (MAS) POSTERIOR LUMBAR INTERBODY FUSION (PLIF)  ;  Surgeon: Tia Alert, MD;  Location: [facility] NEURO ORS;  Service: Neurosurgery;  Laterality: N/A;  . SHOULDER SURGERY     RIGHT   . TONSILLECTOMY      There were no vitals filed for this visit.  Subjective Assessment - 03/11/18 0819    Subjective  Back is ok. R side is a little bit of pain, not much.  More sore than anything else. Her whole body is aching today because she forgot to take her mobic. Also the cold weather plays a factor.  Also did a lot of bending over yesterday to help her husband build a ramp for the lawnmower.  1/10 back pain currently (3/10 soreness). Last night was 5-6/10 soreness in low back and legs.  Had more restless legs yesterday.  7.5/10 back pain at most for the past 7 days.     Pertinent History  Low back pain R > L. Pt initally had bursitis R posterior hip at ischial tuberosity area.  A shot made it go away. However pain returned about a week later. Had another shot R posterior hip which did not help.  R hip bursitis pain began around June 2019.  Pt then started having a tingly sensation R leg from knee to ankle all over.  Felt wierd.  Worsened as time progressed. Was given a muscle relaxer by Dr. Yves Dill.  Pt could  not walk and had to hang onto furniture to move around.  Both legs got wobbly.  Took her tramadol for pain and both her legs gave way and fell right after she swollowed the pill this past Semptember 2019.   Was fine the next day.  The insurance will not let her have and MRI until she does PT.       Patient Stated Goals  Improve flexibility    Currently in Pain?  Yes    Pain Score  3     Pain Location  Back    Pain Descriptors / Indicators  Sore    Pain Onset  More than a month ago                               PT Education - 03/11/18 0925    Education provided  Yes    Education Details  ther-ex, HEP    Person(s) Educated  Patient    Methods  Explanation;Demonstration;Tactile cues;Verbal cues;Handout    Comprehension  Returned demonstration;Verbalized understanding       Objective    R upper trap tension     MedbridgeAccess Code: [email or web address]  Gait: L pelvic drop with R low back side bend during R LE stance phase.  R lumbar paraspinal muscle tension palpated.  Therapeutic exercise    Seated position: feet propped on Air Ex pad or 4 inch step   Sitting with upright posture              Transversus abdominis activation 10x10 seconds  Gentle manual perturbation from PT 30 seconds x 4  Gentle trunk flexion isometrics 10x5 seconds for 3 sets              B scapular retraction 10x3 with 5 second holds    Standing with B UE assist Hip abduction, pain free range R 10x2 L 10x2   Standing straight pallof press resisting double yellow band 10x3 with 5 second holds  Reviewed and given as part of her HEP. Pt demonstrated and verbalized understanding. Handout provided.     Standing regular pallof press resisting double yellow band              Resistance on L side 10x3.    Improved exercise technique, movement at target joints, use of target muscles after min to  mod verbal, visual, tactile cues.    Better able to tolerate seated gentle trunk perturbation exercise  today with less R low back pain compared to previous session. Continued working on improving trunk muscle activation and strength to help control/decrease movement and stress to her low back area. Also worked on scapular retractions to help promote thoracic extension and help decrease extension pressure to low back. Worked on glute med strengthening to help decrease pelvic drop and stress to low back when walking. Pt states back feeling a little better after session. Provided pallof press exercises to her home program to promote trunk strength and decrease R lumbar paraspinal muscle tension.       PT Short Term Goals - 02/09/18 1635      PT SHORT TERM GOAL #1   Title  Patient will be independent with her HEP to promote ability to perform functional tasks more comfortably for her back and R LE.     Baseline  Pt has started her HEP. (02/09/2018)    Time  3    Period  Weeks    Status  New    Target Date  02/11/18        PT Long Term Goals - 02/09/18 1628      PT LONG TERM GOAL #1   Title  Patient will have a decrease in R low back/ hip pain to 4/10 or less at worst to improve ability to perform standing tasks, improve ability to ambulate longer distances, tolerate sitting for longer periods.     Baseline  7/10 back/R hip pain at worst for the past 3 months (02/09/2018)    Time  6    Period  Weeks    Status  New    Target Date  03/25/18      PT LONG TERM GOAL #2   Title  Pt will improve her lumbar spine FOTO score by at least 10 points as a demonstration of improved function.     Baseline  Lumbar spine FOTO 53 (02/09/2018)    Time  6    Period  Weeks    Status  New    Target Date  03/25/18      PT LONG TERM GOAL #3   Title  Patient will improve bilateral hip abduction and extension strength by at least 1/2 MMT to promote ability to perform standing tasks with less back and R LE  pain.     Time  6    Period  Weeks    Status  New    Target Date  03/25/18            Plan - 03/11/18 0817    Clinical Impression Statement  Better able to tolerate seated gentle trunk perturbation exercise today with less R low back pain compared to previous session. Continued working on improving trunk muscle activation and strength to help control/decrease movement and stress to her low back area. Also worked on scapular retractions to help promote thoracic extension and help decrease extension pressure to low back. Worked on glute med strengthening to help decrease pelvic drop and stress to low back when walking. Pt states back feeling a little better after session. Provided pallof press exercises to her home program to promote trunk strength and decrease R lumbar paraspinal muscle tension.     Rehab Potential  Fair    Clinical Impairments Affecting Rehab Potential  (-) comorbidities, hip weakness, age, back and R LE pain and symptoms; (+) motivated    PT Frequency  2x / week    PT Duration  6 weeks  PT Treatment/Interventions  Therapeutic activities;Therapeutic exercise;Neuromuscular re-education;Patient/family education;Manual techniques;Dry needling;Aquatic Therapy;Electrical Stimulation;Iontophoresis 4mg /ml Dexamethasone;Gait training;Stair training    PT Next Visit Plan  scapular, trunk, glute strengthening, thoracic extension, manual techniques, modalities PRN    Consulted and Agree with Plan of Care  Patient       Patient will benefit from skilled therapeutic intervention in order to improve the following deficits and impairments:  Pain, Postural dysfunction, Improper body mechanics, Difficulty walking, Decreased strength  Visit Diagnosis: Low back pain, unspecified back pain laterality, unspecified chronicity, unspecified whether sciatica present  Muscle weakness (generalized)  Pain in right hip     Problem List Patient Active Problem List   Diagnosis Date Noted   . Chronic cough 11/19/2016  . Morbid obesity (HCC) 09/26/2016  . Left ovarian cyst 05/30/2016  . S/P lumbar spinal fusion 12/13/2015  . Aortic calcification (HCC) 07/18/2015  . Controlled type 2 diabetes mellitus without complication (HCC) 05/16/2015  . Thrombocytopenia (HCC) 05/16/2015  . Recurrent major depressive disorder, in full remission (HCC) 05/16/2015  . Essential (primary) hypertension 05/16/2015  . Fatty infiltration of liver 03/15/2015  . Aortic valve stenosis, nonrheumatic 01/18/2015  . Sciatica of right side 01/09/2015  . Type 2 diabetes mellitus (HCC) 11/10/2014  . RLS (restless legs syndrome) 08/23/2014  . Neuritis or radiculitis due to rupture of lumbar intervertebral disc 06/13/2014  . Degeneration of intervertebral disc of lumbar region 06/13/2014  . Lumbar radiculitis 06/13/2014  . Arthritis 01/23/2014  . Arthritis of knee, degenerative 11/15/2013  . Depression 09/29/2013  . Insomnia 09/29/2013  . Apnea, sleep 08/29/2013  . Calculus of kidney 08/29/2013  . Abnormal presence of protein in urine 08/29/2013  . Headache, migraine 08/29/2013  . BP (high blood pressure) 08/29/2013  . HLD (hyperlipidemia) 08/29/2013  . Allergic rhinitis 08/29/2013  . Intractable migraine without aura 02/18/2013    Loralyn Freshwater PT, DPT  03/11/2018, 9:30 AM  [place] [facility] PHYSICAL AND SPORTS MEDICINE [address], [place], [address] Phone: [phone]   Fax:  [phone]  Name: Jessica Cole MRN: [id number] Date of Birth: May 09, 1950

## 2018-03-11 NOTE — Patient Instructions (Signed)
  Standing straight pallof press resisting double yellow band 10x3 with 5 second holds  Reviewed and given as part of her HEP. Pt demonstrated and verbalized understanding. Handout provided.    MedbridgeAccess Code: [email or web address]   Standing Anti-Rotation Press with Anchored Resistance  Resistance on L side, yellow band 10x3

## 2018-03-12 ENCOUNTER — Ambulatory Visit
Admission: RE | Admit: 2018-03-12 | Discharge: 2018-03-12 | Disposition: A | Discharge status: Home / Self Care | Payer: Medicare HMO | Source: Ambulatory Visit | Attending: Internal Medicine | Admitting: Internal Medicine

## 2018-03-12 ENCOUNTER — Ambulatory Visit
Admission: RE | Admit: 2018-03-12 | Discharge: 2018-03-12 | Disposition: A | Discharge status: Home / Self Care | Payer: Medicare HMO | Source: Ambulatory Visit | Attending: Physical Medicine and Rehabilitation | Admitting: Physical Medicine and Rehabilitation

## 2018-03-12 DIAGNOSIS — M5416 Radiculopathy, lumbar region: Secondary | ICD-10-CM | POA: Insufficient documentation

## 2018-03-12 DIAGNOSIS — Z1231 Encounter for screening mammogram for malignant neoplasm of breast: Secondary | ICD-10-CM | POA: Insufficient documentation

## 2018-03-15 ENCOUNTER — Ambulatory Visit: Payer: Medicare HMO

## 2018-03-15 DIAGNOSIS — M545 Low back pain, unspecified: Secondary | ICD-10-CM

## 2018-03-15 DIAGNOSIS — M6281 Muscle weakness (generalized): Secondary | ICD-10-CM

## 2018-03-15 DIAGNOSIS — M25551 Pain in right hip: Secondary | ICD-10-CM

## 2018-03-15 NOTE — Therapy (Signed)
[place] [facility] PHYS[facility] SPORTS MEDICINE [address], [place], [address] Phone: [phone]   Fax:  [phone]  Physical Therapy Treatment  Patient Details  Name: Jessica Cole MRN: [id number] Date of Birth: [birth year] Referring Provider (PT): Merri Ray, DO   Encounter Date: 03/15/2018  PT End of Session - 03/15/18 0951    Visit Number  9    Number of Visits  13    Date for PT Re-Evaluation  03/25/18    Authorization Type  9    Authorization Time Period  of 10 progress report    PT Start Time  0952    PT Stop Time  1033    PT Time Calculation (min)  41 min    Activity Tolerance  Patient tolerated treatment well    Behavior During Therapy  [facility] for tasks assessed/performed       Past Medical History:  Diagnosis Date  . Anxiety   . Colon polyps   . Degenerative arthritis   . Depression   . Diabetes mellitus without complication (HCC)    Type II  . Environmental allergies   . Family history of adverse reaction to anesthesia    sister- PONV  . Fatty liver   . Glaucoma   . Headache(784.0)    HX  MIGRAINES  . History of bronchitis   . History of kidney stones   . History of pneumonia   . Hypertension   . Hypothyroidism   . Obesity   . OSA on CPAP   . Plantar fasciitis    right  . PONV (postoperative nausea and vomiting)   . Renal calculi   . Renal calculi   . RLS (restless legs syndrome) 08/23/2014    Past Surgical History:  Procedure Laterality Date  . ABDOMINAL HYSTERECTOMY     partial  . APPENDECTOMY    . Arthroscopic surgery, knee Left   . BREAST BIOPSY Right    several  . BREAST BIOPSY Right 03/11/2017   u/s bx neg  . BUNIONECTOMY     LEFT   . COLONOSCOPY W/ POLYPECTOMY    . COLONOSCOPY WITH PROPOFOL N/A 08/14/2017   Procedure: COLONOSCOPY WITH PROPOFOL;  Surgeon: Christena Deem, MD;  Location[facility] ENDOSCOPY;  Service: Endoscopy;  Laterality: N/A;  . EXTRACORPOREAL SHOCK WAVE LITHOTRIPSY  Left 05/14/2017   Procedure: EXTRACORPOREAL SHOCK WAVE LITHOTRIPSY (ESWL);  Surgeon: Riki Altes, MD;  Location: [facility] ORS;  Service: Urology;  Laterality: Left;  . FOOT SURGERY     RIGHT    . KNEE ARTHROSCOPY W/ MENISCAL REPAIR Left   . LASIK    . MAXIMUM ACCESS (MAS)POSTERIOR LUMBAR INTERBODY FUSION (PLIF) 1 LEVEL N/A 04/25/2016   Procedure: LUMBAR FOUR-FIVE  MAXIMUM ACCESS (MAS) POSTERIOR LUMBAR INTERBODY FUSION (PLIF) with extension of instrumentation LUMBAR TWO-FIVE;  Surgeon: Tia Alert, MD;  Location[facility] OR;  Service: Neurosurgery;  Laterality: N/A;  . MAXIMUM ACCESS (MAS)POSTERIOR LUMBAR INTERBODY FUSION (PLIF) 2 LEVEL N/A 12/13/2015   Procedure: Lumbar two-three - Lumbar three-four MAXIMUM ACCESS (MAS) POSTERIOR LUMBAR INTERBODY FUSION (PLIF)  ;  Surgeon: Tia Alert, MD;  Location: [facility] NEURO ORS;  Service: Neurosurgery;  Laterality: N/A;  . SHOULDER SURGERY     RIGHT   . TONSILLECTOMY      There were no vitals filed for this visit.  Subjective Assessment - 03/15/18 0953    Subjective  Pt states that she has a bulging disc in the bottom her her  back which seems to be the problem. Has an appointment this afternoon to talk to the PA.  Was unable to do her HEP.  L arm feels like it locks up.  Restless legs have gotten worse. The cream for her restless legs is not working.  Husband did not take the news of her back well.    8/10 low back and R hip pain at most for the past 7 days.     Pertinent History  Low back pain R > L. Pt initally had bursitis R posterior hip at ischial tuberosity area.  A shot made it go away. However pain returned about a week later. Had another shot R posterior hip which did not help.  R hip bursitis pain began around June 2019.  Pt then started having a tingly sensation R leg from knee to ankle all over.  Felt wierd.  Worsened as time progressed. Was given a muscle relaxer by Dr. Yves Dill.  Pt could not walk and had to hang onto furniture to move around.  Both  legs got wobbly.  Took her tramadol for pain and both her legs gave way and fell right after she swollowed the pill this past Semptember 2019.   Was fine the next day.  The insurance will not let her have and MRI until she does PT.       Patient Stated Goals  Improve flexibility    Currently in Pain?  No/denies   mainly a sensation B Lateral hips for restless legs   Pain Onset  More than a month ago         [facility] PT Assessment - 03/15/18 0001      Strength   Right Hip Extension  4+/5   seated manually resisted   Right Hip ABduction  4+/5   seated clamshell isometrics   Left Hip Flexion  4+/5   with R low back side bend and twinge   Left Hip Extension  4+/5   seated manually resisted   Left Hip ABduction  --   seated clamshell isometrics                          PT Education - 03/15/18 1005    Education provided  Yes    Education Details  ther-ex    [employer]) Educated  Patient    Methods  Explanation;Demonstration;Tactile cues;Verbal cues    Comprehension  Returned demonstration;Verbalized understanding      Objective    R upper trap tension     MedbridgeAccess Code: [email or web address]  Gait: L pelvic drop with R low back side bend during R LE stance phase.  R lumbar paraspinal muscle tension palpated.  Therapeutic exercise   increased time to listen to patient concerns   Seated position: feet propped on Air Ex pador 4 inch step  Sitting with lumbar towel roll 1 min. Slight decrease in R LE symptoms, slight increase in L LE symptoms.    Then with towel roll more to the R x 1 min   Very slight decrease in L LE symptoms. Unclear for R LE symptoms.   Standing straight pallof press resisting double yellow band 10x5 seconds for 2 sets  Seated manually resisted hip extension and clamshell 1-2x each way for each LE  Seated manually resisted clamshell isometrics, hips less than 90 degrees flexion 10x2 with 5 second holds   Sitting  with upright posture  Gentle manual perturbation from PT 30 seconds x  4  Gentle trunk flexion isometrics 10x5 secondsfor 2 sets  Time taken to answer pt questions and concerns.   Improved exercise technique, movement at target joints, use of target muscles after mod verbal, visual, tactile cues.    Pt demonstrates overall improved B hip strength. Continued working on trunk and glute strengthening to help decrease pressure to low back with standing tasks. Pt states back feeling a little better after session.  .      PT Short Term Goals - 02/09/18 1635      PT SHORT TERM GOAL #1   Title  Patient will be independent with her HEP to promote ability to perform functional tasks more comfortably for her back and R LE.     Baseline  Pt has started her HEP. (02/09/2018)    Time  3    Period  Weeks    Status  New    Target Date  02/11/18        PT Long Term Goals - 02/09/18 1628      PT LONG TERM GOAL #1   Title  Patient will have a decrease in R low back/ hip pain to 4/10 or less at worst to improve ability to perform standing tasks, improve ability to ambulate longer distances, tolerate sitting for longer periods.     Baseline  7/10 back/R hip pain at worst for the past 3 months (02/09/2018)    Time  6    Period  Weeks    Status  New    Target Date  03/25/18      PT LONG TERM GOAL #2   Title  Pt will improve her lumbar spine FOTO score by at least 10 points as a demonstration of improved function.     Baseline  Lumbar spine FOTO 53 (02/09/2018)    Time  6    Period  Weeks    Status  New    Target Date  03/25/18      PT LONG TERM GOAL #3   Title  Patient will improve bilateral hip abduction and extension strength by at least 1/2 MMT to promote ability to perform standing tasks with less back and R LE pain.     Time  6    Period  Weeks    Status  New    Target Date  03/25/18            Plan - 03/15/18 1006    Clinical Impression Statement   Pt demonstrates overall improved B hip strength. Continued working on trunk and glute strengthening to help decrease pressure to low back with standing tasks. Pt states back feeling a little better after session.     Rehab Potential  Fair    Clinical Impairments Affecting Rehab Potential  (-) comorbidities, hip weakness, age, back and R LE pain and symptoms; (+) motivated    PT Frequency  2x / week    PT Duration  6 weeks    PT Treatment/Interventions  Therapeutic activities;Therapeutic exercise;Neuromuscular re-education;Patient/family education;Manual techniques;Dry needling;Aquatic Therapy;Electrical Stimulation;Iontophoresis 4mg /ml Dexamethasone;Gait training;Stair training    PT Next Visit Plan  scapular, trunk, glute strengthening, thoracic extension, manual techniques, modalities PRN    Consulted and Agree with Plan of Care  Patient       Patient will benefit from skilled therapeutic intervention in order to improve the following deficits and impairments:  Pain, Postural dysfunction, Improper body mechanics, Difficulty walking, Decreased strength  Visit Diagnosis: Low back pain, unspecified back pain laterality,  unspecified chronicity, unspecified whether sciatica present  Muscle weakness (generalized)  Pain in right hip     Problem List Patient Active Problem List   Diagnosis Date Noted  . Chronic cough 11/19/2016  . Morbid obesity (HCC) 09/26/2016  . Left ovarian cyst 05/30/2016  . S/P lumbar spinal fusion 12/13/2015  . Aortic calcification (HCC) 07/18/2015  . Controlled type 2 diabetes mellitus without complication (HCC) 05/16/2015  . Thrombocytopenia (HCC) 05/16/2015  . Recurrent major depressive disorder, in full remission (HCC) 05/16/2015  . Essential (primary) hypertension 05/16/2015  . Fatty infiltration of liver 03/15/2015  . Aortic valve stenosis, nonrheumatic 01/18/2015  . Sciatica of right side 01/09/2015  . Type 2 diabetes mellitus (HCC) 11/10/2014  . RLS  (restless legs syndrome) 08/23/2014  . Neuritis or radiculitis due to rupture of lumbar intervertebral disc 06/13/2014  . Degeneration of intervertebral disc of lumbar region 06/13/2014  . Lumbar radiculitis 06/13/2014  . Arthritis 01/23/2014  . Arthritis of knee, degenerative 11/15/2013  . Depression 09/29/2013  . Insomnia 09/29/2013  . Apnea, sleep 08/29/2013  . Calculus of kidney 08/29/2013  . Abnormal presence of protein in urine 08/29/2013  . Headache, migraine 08/29/2013  . BP (high blood pressure) 08/29/2013  . HLD (hyperlipidemia) 08/29/2013  . Allergic rhinitis 08/29/2013  . Intractable migraine without aura 02/18/2013   Loralyn Freshwater PT, DPT   03/15/2018, 1:01 PM  [place] [facility] PHYSICAL AND SPORTS MEDICINE [address], [place], [address] Phone: [phone]   Fax:  [phone]  Name: Jessica Cole MRN: [id number] Date of Birth: 14-Dec-1950

## 2018-03-15 NOTE — Patient Instructions (Addendum)
  At the pool, you can activate your abdominal muscles and:   Walk forward,     Walk side ways

## 2018-03-17 ENCOUNTER — Ambulatory Visit: Payer: Medicare HMO

## 2018-03-17 DIAGNOSIS — M545 Low back pain, unspecified: Secondary | ICD-10-CM

## 2018-03-17 DIAGNOSIS — M25551 Pain in right hip: Secondary | ICD-10-CM

## 2018-03-17 DIAGNOSIS — M6281 Muscle weakness (generalized): Secondary | ICD-10-CM

## 2018-03-17 NOTE — Therapy (Signed)
[facility]ICINE [address], [place], [address] Phone: [phone]   Fax:  [phone]  Physical Therapy Treatment And Progress Report (02/09/2018 to 03/17/2018)  Patient Details  Name: Jessica Cole MRN: [id number] Date of Birth: May 22, 1950 Referring Provider (PT): Merri Ray, DO   Encounter Date: 03/17/2018  PT End of Session - 03/17/18 1032    Visit Number  10    Number of Visits  23    Date for PT Re-Evaluation  04/30/18    Authorization Type  10    Authorization Time Period  of 10 progress report    PT Start Time  1032    PT Stop Time  1120    PT Time Calculation (min)  48 min    Activity Tolerance  Patient tolerated treatment well    Behavior During Therapy  [facility] for tasks assessed/performed       Past Medical History:  Diagnosis Date  . Anxiety   . Colon polyps   . Degenerative arthritis   . Depression   . Diabetes mellitus without complication (HCC)    Type II  . Environmental allergies   . Family history of adverse reaction to anesthesia    sister- PONV  . Fatty liver   . Glaucoma   . Headache(784.0)    HX  MIGRAINES  . History of bronchitis   . History of kidney stones   . History of pneumonia   . Hypertension   . Hypothyroidism   . Obesity   . OSA on CPAP   . Plantar fasciitis    right  . PONV (postoperative nausea and vomiting)   . Renal calculi   . Renal calculi   . RLS (restless legs syndrome) 08/23/2014    Past Surgical History:  Procedure Laterality Date  . ABDOMINAL HYSTERECTOMY     partial  . APPENDECTOMY    . Arthroscopic surgery, knee Left   . BREAST BIOPSY Right    several  . BREAST BIOPSY Right 03/11/2017   u/s bx neg  . BUNIONECTOMY     LEFT   . COLONOSCOPY W/ POLYPECTOMY    . COLONOSCOPY WITH PROPOFOL N/A 08/14/2017   Procedure: COLONOSCOPY WITH PROPOFOL;  Surgeon: Christena Deem, MD;  Location: [facility] ENDOSCOPY;  Service: Endoscopy;   Laterality: N/A;  . EXTRACORPOREAL SHOCK WAVE LITHOTRIPSY Left 05/14/2017   Procedure: EXTRACORPOREAL SHOCK WAVE LITHOTRIPSY (ESWL);  Surgeon: Riki Altes, MD;  Location: [facility] ORS;  Service: Urology;  Laterality: Left;  . FOOT SURGERY     RIGHT    . KNEE ARTHROSCOPY W/ MENISCAL REPAIR Left   . LASIK    . MAXIMUM ACCESS (MAS)POSTERIOR LUMBAR INTERBODY FUSION (PLIF) 1 LEVEL N/A 04/25/2016   Procedure: LUMBAR FOUR-FIVE  MAXIMUM ACCESS (MAS) POSTERIOR LUMBAR INTERBODY FUSION (PLIF) with extension of instrumentation LUMBAR TWO-FIVE;  Surgeon: Tia Alert, MD;  Location: [facility] OR;  Service: Neurosurgery;  Laterality: N/A;  . MAXIMUM ACCESS (MAS)POSTERIOR LUMBAR INTERBODY FUSION (PLIF) 2 LEVEL N/A 12/13/2015   Procedure: Lumbar two-three - Lumbar three-four MAXIMUM ACCESS (MAS) POSTERIOR LUMBAR INTERBODY FUSION (PLIF)  ;  Surgeon: Tia Alert, MD;  Location: [facility] NEURO ORS;  Service: Neurosurgery;  Laterality: N/A;  . SHOULDER SURGERY     RIGHT   . TONSILLECTOMY      There were no vitals filed for this visit.  Subjective Assessment - 03/17/18 1034    Subjective  Hurting today. 8/10 currently. Was a little  better (about 5-6/10) after exercises last session. Did not do her exercise due to her state of mind from the news of her back and her husband's reaction to it.  L thumb is also bothering her. Cannot find her L thumb brace.  Does not have to have surgery for her back.  The PA wants her to get a shot for her back.  Pt states that she wants to continue PT so long as her insurance pays for it.     Pertinent History  Low back pain R > L. Pt initally had bursitis R posterior hip at ischial tuberosity area.  A shot made it go away. However pain returned about a week later. Had another shot R posterior hip which did not help.  R hip bursitis pain began around June 2019.  Pt then started having a tingly sensation R leg from knee to ankle all over.  Felt wierd.  Worsened as time progressed. Was given a muscle  relaxer by Dr. Yves Dill.  Pt could not walk and had to hang onto furniture to move around.  Both legs got wobbly.  Took her tramadol for pain and both her legs gave way and fell right after she swollowed the pill this past Semptember 2019.   Was fine the next day.  The insurance will not let her have and MRI until she does PT.       Patient Stated Goals  Improve flexibility    Currently in Pain?  Yes    Pain Score  8     Pain Onset  More than a month ago         [facility] PT Assessment - 03/17/18 0001      Observation/Other Assessments   Focus on Therapeutic Outcomes (FOTO)   lumbar spine FOTO 50                           PT Education - 03/17/18 1057    Education provided  Yes    Education Details  ther-ex    [employer]) Educated  Patient    Methods  Explanation;Demonstration;Tactile cues;Verbal cues    Comprehension  Returned demonstration;Verbalized understanding      Objective     MedbridgeAccess Code: [email or web address]  Gait: L pelvic drop with R low back side bend during R LE stance phase.  R lumbar paraspinal muscle tension palpated.  Therapeutic exercise    Standing B shoulder extension resisting yellow band 10x3  Standing pallof press with resistance on L side 5x5 seconds   L anterior shoulder discomfort  Standing gentle manual resistance to R trunk rotation isometric 5x6 with 5 second holds   Straight pallof press in standing resisting double yellow band 10x5 seconds   Sitting with feet propped on Air Ex pad   Gentle manually resisted trunk flexion isometrics 10x5 seconds for 3 sets  Seated manually resisted L trunk rotation isometrics 10x5 seconds (slight decrease in R lumbar paraspinal muscle tension palapted)    Standing with R foot propped on treadmill platform with B UE assist 5x5 seconds. Discomfort  Standing glute max squeeze 5x5 seconds  Standing scapular retraction 10x3   Decreased R low back pain to 1/10   Reviewed plan  of care: 2x/week for 6 weeks.   Improved exercise technique, movement at target joints, use of target muscles after mod verbal, visual, tactile cues.    Decreased back pain following gentle exercises to promote thoracic extension and trunk muscle activation.  1/10 back pain after session. Pt also demonstrates improved bilateral hip extension and abduction strength since initial evaluation. No decrease in overall pain level at worst and function seems to be similar to initial evaluation but pt also reports not being able to perform her HEP recently due to personal factors. Pt states wanting to continue PT. Pt will benefit from continued skilled physical therapy services to decrease back pain, improve trunk and LE strength and improve ability to perform functional tasks.          PT Short Term Goals - 03/17/18 1229      PT SHORT TERM GOAL #1   Title  Patient will be independent with her HEP to promote ability to perform functional tasks more comfortably for her back and R LE.     Baseline  Pt has started her HEP. (02/09/2018); Pt has not been performing her HEP recently (03/17/2018)    Time  3    Period  Weeks    Status  On-going    Target Date  04/29/18        PT Long Term Goals - 03/17/18 1229      PT LONG TERM GOAL #1   Title  Patient will have a decrease in R low back/ hip pain to 4/10 or less at worst to improve ability to perform standing tasks, improve ability to ambulate longer distances, tolerate sitting for longer periods.     Baseline  7/10 back/R hip pain at worst for the past 3 months (02/09/2018); 8/10 at most (03/17/2018)    Time  6    Period  Weeks    Status  On-going    Target Date  04/29/18      PT LONG TERM GOAL #2   Title  Pt will improve her lumbar spine FOTO score by at least 10 points as a demonstration of improved function.     Baseline  Lumbar spine FOTO 53 (02/09/2018); 50 (03/17/2018)    Time  6    Period  Weeks    Status  On-going    Target Date   04/29/18      PT LONG TERM GOAL #3   Title  Patient will improve bilateral hip abduction and extension strength by at least 1/2 MMT to promote ability to perform standing tasks with less back and R LE pain.     Time  6    Period  Weeks    Status  Achieved    Target Date  03/25/18            Plan - 03/17/18 1028    Clinical Impression Statement  Decreased back pain following gentle exercises to promote thoracic extension and trunk muscle activation. 1/10 back pain after session. Pt also demonstrates improved bilateral hip extension and abduction strength since initial evaluation. No decrease in overall pain level at worst and function seems to be similar to initial evaluation but pt also reports not being able to perform her HEP recently due to personal factors. Pt states wanting to continue PT. Pt will benefit from continued skilled physical therapy services to decrease back pain, improve trunk and LE strength and improve ability to perform functional tasks.     History and Personal Factors relevant to plan of care:  back pain, hx of back surgeries, poor posture, multiple comorbidities, age    Clinical Presentation  Stable    Clinical Presentation due to:  improved hip strength, similar pain level and function since evaluation  Clinical Decision Making  Low    Rehab Potential  Fair    Clinical Impairments Affecting Rehab Potential  (-) comorbidities, hip weakness, age, back and R LE pain and symptoms; (+) motivated    PT Frequency  2x / week    PT Duration  6 weeks    PT Treatment/Interventions  Therapeutic activities;Therapeutic exercise;Neuromuscular re-education;Patient/family education;Manual techniques;Dry needling;Aquatic Therapy;Electrical Stimulation;Iontophoresis 4mg /ml Dexamethasone;Gait training;Stair training    PT Next Visit Plan  scapular, trunk, glute strengthening, thoracic extension, manual techniques, modalities PRN    Consulted and Agree with Plan of Care  Patient        Patient will benefit from skilled therapeutic intervention in order to improve the following deficits and impairments:  Pain, Postural dysfunction, Improper body mechanics, Difficulty walking, Decreased strength  Visit Diagnosis: Low back pain, unspecified back pain laterality, unspecified chronicity, unspecified whether sciatica present - Plan: PT plan of care cert/re-cert  Muscle weakness (generalized) - Plan: PT plan of care cert/re-cert  Pain in right hip - Plan: PT plan of care cert/re-cert     Problem List Patient Active Problem List   Diagnosis Date Noted  . Chronic cough 11/19/2016  . Morbid obesity (HCC) 09/26/2016  . Left ovarian cyst 05/30/2016  . S/P lumbar spinal fusion 12/13/2015  . Aortic calcification (HCC) 07/18/2015  . Controlled type 2 diabetes mellitus without complication (HCC) 05/16/2015  . Thrombocytopenia (HCC) 05/16/2015  . Recurrent major depressive disorder, in full remission (HCC) 05/16/2015  . Essential (primary) hypertension 05/16/2015  . Fatty infiltration of liver 03/15/2015  . Aortic valve stenosis, nonrheumatic 01/18/2015  . Sciatica of right side 01/09/2015  . Type 2 diabetes mellitus (HCC) 11/10/2014  . RLS (restless legs syndrome) 08/23/2014  . Neuritis or radiculitis due to rupture of lumbar intervertebral disc 06/13/2014  . Degeneration of intervertebral disc of lumbar region 06/13/2014  . Lumbar radiculitis 06/13/2014  . Arthritis 01/23/2014  . Arthritis of knee, degenerative 11/15/2013  . Depression 09/29/2013  . Insomnia 09/29/2013  . Apnea, sleep 08/29/2013  . Calculus of kidney 08/29/2013  . Abnormal presence of protein in urine 08/29/2013  . Headache, migraine 08/29/2013  . BP (high blood pressure) 08/29/2013  . HLD (hyperlipidemia) 08/29/2013  . Allergic rhinitis 08/29/2013  . Intractable migraine without aura 02/18/2013   Thank you for your referral.  Loralyn Freshwater PT, DPT   03/17/2018, 12:56 PM  [facility] PHYSICAL AND SPORTS MEDICINE [address], [place], [address] Phone: [phone]   Fax:  [phone]  Name: TAWNIE EHRESMAN MRN: [id number] Date of Birth: 22-Apr-1951

## 2018-03-18 ENCOUNTER — Telehealth: Payer: Self-pay | Admitting: Neurology

## 2018-03-18 NOTE — Telephone Encounter (Signed)
Pt requesting a call to discuss medication traMADol (ULTRAM) 50 MG tablet, pt did not wish to discuss further

## 2018-03-18 NOTE — Telephone Encounter (Signed)
Spoke with pt.  She sees Dr. Anne HahnWillis for migraines, and Dr. Anne HahnWillis rx's prn Tramadol, which she only occasionally takes. She sees Dr[facility]al[facility]jamen [facility]. Sts. Dr. Yves Dillhasnis has offered Tramadol, but she is aware she can only get this from one provider.  Wants to know if Dr. Anne HahnWillis will treat her back pain, increase her Tramadol, or does Dr. Anne HahnWillis prefer that she get all Tramadol from Dr. Yves Dillhasnis?

## 2018-03-18 NOTE — Telephone Encounter (Signed)
I called the patient.  She is taking Ultram if needed for headache and if needed for her low back pain.  I have no problem with this, she will call when she needs a refill.

## 2018-03-22 ENCOUNTER — Ambulatory Visit: Payer: Medicare HMO

## 2018-03-22 DIAGNOSIS — M545 Low back pain, unspecified: Secondary | ICD-10-CM

## 2018-03-22 DIAGNOSIS — M6281 Muscle weakness (generalized): Secondary | ICD-10-CM

## 2018-03-22 DIAGNOSIS — M25551 Pain in right hip: Secondary | ICD-10-CM

## 2018-03-22 NOTE — Therapy (Signed)
[facility]IC[facility] [address], [place], [address] Phone: [phone]   Fax:  [phone]  Physical Therapy Treatment  Patient Details  Name: Jessica Cole MRN: [id number] Date of Birth: Apr 08, 1951 Referring Provider (PT): Merri Ray, DO   Encounter Date: 03/22/2018  PT End of Session - 03/22/18 1037    Visit Number  11    Number of Visits  23    Date for PT Re-Evaluation  04/30/18    Authorization Type  1    Authorization Time Period  of 10 progress report    PT Start Time  1038    PT Stop Time  1120    PT Time Calculation (min)  42 min    Activity Tolerance  Patient tolerated treatment well    Behavior During Therapy  [facility] for tasks assessed/performed       Past Medical History:  Diagnosis Date  . Anxiety   . Colon polyps   . Degenerative arthritis   . Depression   . Diabetes mellitus without complication (HCC)    Type II  . Environmental allergies   . Family history of adverse reaction to anesthesia    sister- PONV  . Fatty liver   . Glaucoma   . Headache(784.0)    HX  MIGRAINES  . History of bronchitis   . History of kidney stones   . History of pneumonia   . Hypertension   . Hypothyroidism   . Obesity   . OSA on CPAP   . Plantar fasciitis    right  . PONV (postoperative nausea and vomiting)   . Renal calculi   . Renal calculi   . RLS (restless legs syndrome) 08/23/2014    Past Surgical History:  Procedure Laterality Date  . ABDOMINAL HYSTERECTOMY     partial  . APPENDECTOMY    . Arthroscopic surgery, knee Left   . BREAST BIOPSY Right    several  . BREAST BIOPSY Right 03/11/2017   u/s bx neg  . BUNIONECTOMY     LEFT   . COLONOSCOPY W/ POLYPECTOMY    . COLONOSCOPY WITH PROPOFOL N/A 08/14/2017   Procedure: COLONOSCOPY WITH PROPOFOL;  Surgeon: Christena Deem, MD;  Location: [facility] ENDOSCOPY;  Service: Endoscopy;  Laterality: N/A;  . EXTRACORPOREAL SHOCK WAVE LITHOTRIPSY  Left 05/14/2017   Procedure: EXTRACORPOREAL SHOCK WAVE LITHOTRIPSY (ESWL);  Surgeon: Riki Altes, MD;  Location: [facility] ORS;  Service: Urology;  Laterality: Left;  . FOOT SURGERY     RIGHT    . KNEE ARTHROSCOPY W/ MENISCAL REPAIR Left   . LASIK    . MAXIMUM ACCESS (MAS)POSTERIOR LUMBAR INTERBODY FUSION (PLIF) 1 LEVEL N/A 04/25/2016   Procedure: LUMBAR FOUR-FIVE  MAXIMUM ACCESS (MAS) POSTERIOR LUMBAR INTERBODY FUSION (PLIF) with extension of instrumentation LUMBAR TWO-FIVE;  Surgeon: Tia Alert, MD;  Location: [facility] OR;  Service: Neurosurgery;  Laterality: N/A;  . MAXIMUM ACCESS (MAS)POSTERIOR LUMBAR INTERBODY FUSION (PLIF) 2 LEVEL N/A 12/13/2015   Procedure: Lumbar two-three - Lumbar three-four MAXIMUM ACCESS (MAS) POSTERIOR LUMBAR INTERBODY FUSION (PLIF)  ;  Surgeon: Tia Alert, MD;  Location: [facility] NEURO ORS;  Service: Neurosurgery;  Laterality: N/A;  . SHOULDER SURGERY     RIGHT   . TONSILLECTOMY      There were no vitals filed for this visit.  Subjective Assessment - 03/22/18 1039    Subjective  Back is good. Pt states she is off pain medicine. Has had migaines  for the past few days. Normally gets migrains.  1/10 back pain currently, 1.5/10 when walking.     Pertinent History  Low back pain R > L. Pt initally had bursitis R posterior hip at ischial tuberosity area.  A shot made it go away. However pain returned about a week later. Had another shot R posterior hip which did not help.  R hip bursitis pain began around June 2019.  Pt then started having a tingly sensation R leg from knee to ankle all over.  Felt wierd.  Worsened as time progressed. Was given a muscle relaxer by Dr. Yves Dill.  Pt could not walk and had to hang onto furniture to move around.  Both legs got wobbly.  Took her tramadol for pain and both her legs gave way and fell right after she swollowed the pill this past Semptember 2019.   Was fine the next day.  The insurance will not let her have and MRI until she does PT.        Patient Stated Goals  Improve flexibility    Currently in Pain?  Yes    Pain Score  2    1.5/10   Pain Onset  More than a month ago                               PT Education - 03/22/18 1042    Education provided  Yes    Education Details  ther-ex    [employer]) Educated  Patient    Methods  Explanation;Demonstration;Tactile cues;Verbal cues    Comprehension  Returned demonstration;Verbalized understanding       Objective     MedbridgeAccess Code: [email or web address]  Gait: L pelvic drop with R low back side bend during R LE stance phase.  R lumbar paraspinal muscle tension palpated.  Therapeutic exercise   Blood pressure L arm sitting, mechanically taken, normal cuff: 137/67, HR 57  Standing hip abduction with B UE assist 10x each LE  Standing scapular retraction 10x3 to promote thoracic extension   Standing glute max squeeze 10x5 seconds for 3 sets  Side stepping 20 ft to the R and 20 ft to the L 2x  Standing straight pallof press resisting double yellow band 10x5 seconds for 3 sets  Standing LE leg press resisting double blue band 10x3 each LE with B UE assist  Standing B shoulder extension resisting yellow band 10x3  Static mini lunge onto Air Ex pad with one UE assist 10x2 each LE      Seated manually resisted L trunk rotation isometrics 10x5 seconds for 3 sets    Improved exercise technique, movement at target joints, use of target muscles after min to mod verbal, visual, tactile cues.     Pt demonstrates decreased back pain based on subjective reports. Continued working on thoracic extension, trunk and glute strengthening to help continue progress. Pt states back feels better after session.       PT Short Term Goals - 03/17/18 1229      PT SHORT TERM GOAL #1   Title  Patient will be independent with her HEP to promote ability to perform functional tasks more comfortably for her back and R LE.     Baseline  Pt has  started her HEP. (02/09/2018); Pt has not been performing her HEP recently (03/17/2018)    Time  3    Period  Weeks    Status  On-going  Target Date  04/29/18        PT Long Term Goals - 03/17/18 1229      PT LONG TERM GOAL #1   Title  Patient will have a decrease in R low back/ hip pain to 4/10 or less at worst to improve ability to perform standing tasks, improve ability to ambulate longer distances, tolerate sitting for longer periods.     Baseline  7/10 back/R hip pain at worst for the past 3 months (02/09/2018); 8/10 at most (03/17/2018)    Time  6    Period  Weeks    Status  On-going    Target Date  04/29/18      PT LONG TERM GOAL #2   Title  Pt will improve her lumbar spine FOTO score by at least 10 points as a demonstration of improved function.     Baseline  Lumbar spine FOTO 53 (02/09/2018); 50 (03/17/2018)    Time  6    Period  Weeks    Status  On-going    Target Date  04/29/18      PT LONG TERM GOAL #3   Title  Patient will improve bilateral hip abduction and extension strength by at least 1/2 MMT to promote ability to perform standing tasks with less back and R LE pain.     Time  6    Period  Weeks    Status  Achieved    Target Date  03/25/18            Plan - 03/22/18 1043    Clinical Impression Statement  Pt demonstrates decreased back pain based on subjective reports. Continued working on thoracic extension, trunk and glute strengthening to help continue progress. Pt states back feels better after session.     Rehab Potential  Fair    Clinical Impairments Affecting Rehab Potential  (-) comorbidities, hip weakness, age, back and R LE pain and symptoms; (+) motivated    PT Frequency  2x / week    PT Duration  6 weeks    PT Treatment/Interventions  Therapeutic activities;Therapeutic exercise;Neuromuscular re-education;Patient/family education;Manual techniques;Dry needling;Aquatic Therapy;Electrical Stimulation;Iontophoresis 4mg /ml Dexamethasone;Gait  training;Stair training    PT Next Visit Plan  scapular, trunk, glute strengthening, thoracic extension, manual techniques, modalities PRN    Consulted and Agree with Plan of Care  Patient       Patient will benefit from skilled therapeutic intervention in order to improve the following deficits and impairments:  Pain, Postural dysfunction, Improper body mechanics, Difficulty walking, Decreased strength  Visit Diagnosis: Low back pain, unspecified back pain laterality, unspecified chronicity, unspecified whether sciatica present  Muscle weakness (generalized)  Pain in right hip     Problem List Patient Active Problem List   Diagnosis Date Noted  . Chronic cough 11/19/2016  . Morbid obesity (HCC) 09/26/2016  . Left ovarian cyst 05/30/2016  . S/P lumbar spinal fusion 12/13/2015  . Aortic calcification (HCC) 07/18/2015  . Controlled type 2 diabetes mellitus without complication (HCC) 05/16/2015  . Thrombocytopenia (HCC) 05/16/2015  . Recurrent major depressive disorder, in full remission (HCC) 05/16/2015  . Essential (primary) hypertension 05/16/2015  . Fatty infiltration of liver 03/15/2015  . Aortic valve stenosis, nonrheumatic 01/18/2015  . Sciatica of right side 01/09/2015  . Type 2 diabetes mellitus (HCC) 11/10/2014  . RLS (restless legs syndrome) 08/23/2014  . Neuritis or radiculitis due to rupture of lumbar intervertebral disc 06/13/2014  . Degeneration of intervertebral disc of lumbar region 06/13/2014  . Lumbar  radiculitis 06/13/2014  . Arthritis 01/23/2014  . Arthritis of knee, degenerative 11/15/2013  . Depression 09/29/2013  . Insomnia 09/29/2013  . Apnea, sleep 08/29/2013  . Calculus of kidney 08/29/2013  . Abnormal presence of protein in urine 08/29/2013  . Headache, migraine 08/29/2013  . BP (high blood pressure) 08/29/2013  . HLD (hyperlipidemia) 08/29/2013  . Allergic rhinitis 08/29/2013  . Intractable migraine without aura 02/18/2013    Loralyn FreshwaterMiguel Annaclaire Walsworth  PT, DPT   03/22/2018, 12:25 PM   [facility] [address] [place], [place], [address] Phone: [phone]   Fax:  [phone]  Name: Jessica Cole MRN: [id number] Date of Birth: 22-Apr-1951

## 2018-03-25 ENCOUNTER — Other Ambulatory Visit: Payer: Self-pay | Admitting: Physical Medicine and Rehabilitation

## 2018-03-25 ENCOUNTER — Ambulatory Visit: Payer: Medicare HMO

## 2018-03-25 DIAGNOSIS — M6281 Muscle weakness (generalized): Secondary | ICD-10-CM

## 2018-03-25 DIAGNOSIS — M545 Low back pain, unspecified: Secondary | ICD-10-CM

## 2018-03-25 DIAGNOSIS — M25551 Pain in right hip: Secondary | ICD-10-CM

## 2018-03-25 DIAGNOSIS — M5416 Radiculopathy, lumbar region: Secondary | ICD-10-CM

## 2018-03-25 NOTE — Therapy (Signed)
[facility] PHYSICAL AND SPORTS MEDICINE [address] [place], [place], [address] Phone: [phone]   Fax:  [phone]  Physical Therapy Treatment  Patient Details  Name: Jessica Cole MRN: [id number] Date of Birth: 09/16/1950 Referring Provider (PT): Merri RayBenjamin Chasnis, DO   Encounter Date: 03/25/2018  PT End of Session - 03/25/18 1039    Visit Number  12    Number of Visits  23    Date for PT Re-Evaluation  04/30/18    Authorization Type  2    Authorization Time Period  of 10 progress report    PT Start Time  1039    PT Stop Time  1122    PT Time Calculation (min)  43 min    Activity Tolerance  Patient tolerated treatment well    Behavior During Therapy  [facility] for tasks assessed/performed       Past Medical History:  Diagnosis Date  . Anxiety   . Colon polyps   . Degenerative arthritis   . Depression   . Diabetes mellitus without complication (HCC)    Type II  . Environmental allergies   . Family history of adverse reaction to anesthesia    sister- PONV  . Fatty liver   . Glaucoma   . Headache(784.0)    HX  MIGRAINES  . History of bronchitis   . History of kidney stones   . History of pneumonia   . Hypertension   . Hypothyroidism   . Obesity   . OSA on CPAP   . Plantar fasciitis    right  . PONV (postoperative nausea and vomiting)   . Renal calculi   . Renal calculi   . RLS (restless legs syndrome) 08/23/2014    Past Surgical History:  Procedure Laterality Date  . ABDOMINAL HYSTERECTOMY     partial  . APPENDECTOMY    . Arthroscopic surgery, knee Left   . BREAST BIOPSY Right    several  . BREAST BIOPSY Right 03/11/2017   u/s bx neg  . BUNIONECTOMY     LEFT   . COLONOSCOPY W/ POLYPECTOMY    . COLONOSCOPY WITH PROPOFOL N/A 08/14/2017   Procedure: COLONOSCOPY WITH PROPOFOL;  Surgeon: Christena DeemSkulskie, Martin U, MD;  [facility];  Service: Endoscopy;  Laterality: N/A;  . EXTRACORPOREAL SHOCK WAVE LITHOTRIPSY  Left 05/14/2017   Procedure: EXTRACORPOREAL SHOCK WAVE LITHOTRIPSY (ESWL);  Surgeon: Riki AltesStoioff, Scott C, MD;  Location: [facility] ORS;  Service: Urology;  Laterality: Left;  . FOOT SURGERY     RIGHT    . KNEE ARTHROSCOPY W/ MENISCAL REPAIR Left   . LASIK    . MAXIMUM ACCESS (MAS)POSTERIOR LUMBAR INTERBODY FUSION (PLIF) 1 LEVEL N/A 04/25/2016   Procedure: LUMBAR FOUR-FIVE  MAXIMUM ACCESS (MAS) POSTERIOR LUMBAR INTERBODY FUSION (PLIF) with extension of instrumentation LUMBAR TWO-FIVE;  Surgeon: Tia Alertavid S Jones, MD;  [facility];  Service: Neurosurgery;  Laterality: N/A;  . MAXIMUM ACCESS (MAS)POSTERIOR LUMBAR INTERBODY FUSION (PLIF) 2 LEVEL N/A 12/13/2015   Procedure: Lumbar two-three - Lumbar three-four MAXIMUM ACCESS (MAS) POSTERIOR LUMBAR INTERBODY FUSION (PLIF)  ;  Surgeon: Tia Alertavid S Jones, MD;  [facility];  Service: Neurosurgery;  Laterality: N/A;  . SHOULDER SURGERY     RIGHT   . TONSILLECTOMY      There were no vitals filed for this visit.  Subjective Assessment - 03/25/18 1041    Subjective  Back is not too bad. Thinks she did a little too much of  something. A little tender, nothing crazy. Might have bent over the wrong way too much.  1/10 currently sitting at rest (2.5/10 when standing and walking).     Pertinent History  Low back pain R > L. Pt initally had bursitis R posterior hip at ischial tuberosity area.  A shot made it go away. However pain returned about a week later. Had another shot R posterior hip which did not help.  R hip bursitis pain began around June 2019.  Pt then started having a tingly sensation R leg from knee to ankle all over.  Felt wierd.  Worsened as time progressed. Was given a muscle relaxer by Dr. Yves Dill.  Pt could not walk and had to hang onto furniture to move around.  Both legs got wobbly.  Took her tramadol for pain and both her legs gave way and fell right after she swollowed the pill this past Semptember 2019.   Was fine the next day.  The insurance will  not let her have and MRI until she does PT.       Patient Stated Goals  Improve flexibility    Currently in Pain?  Yes    Pain Score  3    2.5/10 when standing and walking   Pain Onset  More than a month ago                               PT Education - 03/25/18 1043    Education provided  Yes    Education Details  ther-ex    [employer]) Educated  Patient    Methods  Explanation;Demonstration;Tactile cues;Verbal cues    Comprehension  Returned demonstration;Verbalized understanding      Objective     MedbridgeAccess Code: [email or web address]  Gait: L pelvic drop with R low back side bend during R LE stance phase.  R lumbar paraspinal muscle tension palpated.  Therapeutic exercise  Seated manually resisted L trunk rotation isometrics 10x5 seconds for 3 sets   Standing hip abduction with B UE assist 10x each LE  Then with 2 lbs 10x2 each LE   Standing scapular retraction red band 10x3 to promote thoracic extension   Standing LE leg press resisting double blue band 10x3 each LE with B UE assist  Standing straight pallof press resisting double yellow band 10x10 seconds for 3 sets  Side stepping 20 ft to the R and 20 ft to the L 3x  Forward step up onto 4 inch step with one UE assist   R 10x2  L 10x2  No complain of increased pain. Reviewed and given as part of her HEP secondary to pt having a step at home. Pt demonstrated and verbalized understanding.    Static mini lunge onto Air Ex pad with one UE assist 10x2 each LE  Plan of care: possible graduation to HEP next week secondary to progress and insurance.     Improved exercise technique, movement at target joints, use of target muscles after min to mod verbal, visual, tactile cues.    Pt seems to be doing well with pt reporting about 1/10 back pain at rest during the start of the past 2 sessions including today's. Continued working on trunk and glute strengthening to help promote  lumbopelvic control with standing activities and decrease stress to her low back.         PT Short Term Goals - 03/17/18 1229      PT  SHORT TERM GOAL #1   Title  Patient will be independent with her HEP to promote ability to perform functional tasks more comfortably for her back and R LE.     Baseline  Pt has started her HEP. (02/09/2018); Pt has not been performing her HEP recently (03/17/2018)    Time  3    Period  Weeks    Status  On-going    Target Date  04/29/18        PT Long Term Goals - 03/17/18 1229      PT LONG TERM GOAL #1   Title  Patient will have a decrease in R low back/ hip pain to 4/10 or less at worst to improve ability to perform standing tasks, improve ability to ambulate longer distances, tolerate sitting for longer periods.     Baseline  7/10 back/R hip pain at worst for the past 3 months (02/09/2018); 8/10 at most (03/17/2018)    Time  6    Period  Weeks    Status  On-going    Target Date  04/29/18      PT LONG TERM GOAL #2   Title  Pt will improve her lumbar spine FOTO score by at least 10 points as a demonstration of improved function.     Baseline  Lumbar spine FOTO 53 (02/09/2018); 50 (03/17/2018)    Time  6    Period  Weeks    Status  On-going    Target Date  04/29/18      PT LONG TERM GOAL #3   Title  Patient will improve bilateral hip abduction and extension strength by at least 1/2 MMT to promote ability to perform standing tasks with less back and R LE pain.     Time  6    Period  Weeks    Status  Achieved    Target Date  03/25/18            Plan - 03/25/18 1050    Clinical Impression Statement  Pt seems to be doing well with pt reporting about 1/10 back pain at rest during the start of the past 2 sessions including today's. Continued working on trunk and glute strengthening to help promote lumbopelvic control with standing activities and decrease stress to her low back.     Rehab Potential  Fair    Clinical Impairments Affecting  Rehab Potential  (-) comorbidities, hip weakness, age, back and R LE pain and symptoms; (+) motivated    PT Frequency  2x / week    PT Duration  6 weeks    PT Treatment/Interventions  Therapeutic activities;Therapeutic exercise;Neuromuscular re-education;Patient/family education;Manual techniques;Dry needling;Aquatic Therapy;Electrical Stimulation;Iontophoresis 4mg /ml Dexamethasone;Gait training;Stair training    PT Next Visit Plan  scapular, trunk, glute strengthening, thoracic extension, manual techniques, modalities PRN    Consulted and Agree with Plan of Care  Patient       Patient will benefit from skilled therapeutic intervention in order to improve the following deficits and impairments:  Pain, Postural dysfunction, Improper body mechanics, Difficulty walking, Decreased strength  Visit Diagnosis: Low back pain, unspecified back pain laterality, unspecified chronicity, unspecified whether sciatica present  Muscle weakness (generalized)  Pain in right hip     Problem List Patient Active Problem List   Diagnosis Date Noted  . Chronic cough 11/19/2016  . Morbid obesity (HCC) 09/26/2016  . Left ovarian cyst 05/30/2016  . S/P lumbar spinal fusion 12/13/2015  . Aortic calcification (HCC) 07/18/2015  . Controlled type 2  diabetes mellitus without complication (HCC) 05/16/2015  . Thrombocytopenia (HCC) 05/16/2015  . Recurrent major depressive disorder, in full remission (HCC) 05/16/2015  . Essential (primary) hypertension 05/16/2015  . Fatty infiltration of liver 03/15/2015  . Aortic valve stenosis, nonrheumatic 01/18/2015  . Sciatica of right side 01/09/2015  . Type 2 diabetes mellitus (HCC) 11/10/2014  . RLS (restless legs syndrome) 08/23/2014  . Neuritis or radiculitis due to rupture of lumbar intervertebral disc 06/13/2014  . Degeneration of intervertebral disc of lumbar region 06/13/2014  . Lumbar radiculitis 06/13/2014  . Arthritis 01/23/2014  . Arthritis of knee,  degenerative 11/15/2013  . Depression 09/29/2013  . Insomnia 09/29/2013  . Apnea, sleep 08/29/2013  . Calculus of kidney 08/29/2013  . Abnormal presence of protein in urine 08/29/2013  . Headache, migraine 08/29/2013  . BP (high blood pressure) 08/29/2013  . HLD (hyperlipidemia) 08/29/2013  . Allergic rhinitis 08/29/2013  . Intractable migraine without aura 02/18/2013    Loralyn Freshwater PT, DPT   03/25/2018, 11:38 AM  [employer] [facility] PHYSICAL AND SPORTS MEDICINE [address] [place], [address] Phone: [phone]   Fax:  [phone]  Name: Jessica Cole MRN: [id number] Date of Birth: 12-18-1950

## 2018-03-25 NOTE — Patient Instructions (Addendum)
Forward step up onto 4 inch step with one UE assist   R 10x2  L 10x2  No complain of increased pain. Reviewed and given as part of her HEP secondary to pt having a step at home. Pt demonstrated and verbalized understanding.     Standing straight pallof press resisting double yellow band 10x10 seconds for 3 sets

## 2018-03-29 ENCOUNTER — Ambulatory Visit: Payer: Medicare HMO

## 2018-03-31 ENCOUNTER — Ambulatory Visit: Payer: Medicare HMO

## 2018-03-31 DIAGNOSIS — M545 Low back pain, unspecified: Secondary | ICD-10-CM

## 2018-03-31 DIAGNOSIS — M25551 Pain in right hip: Secondary | ICD-10-CM

## 2018-03-31 DIAGNOSIS — M6281 Muscle weakness (generalized): Secondary | ICD-10-CM

## 2018-03-31 NOTE — Therapy (Signed)
[place] PHYSICAL AND SPORTS MEDICINE [address] [place], [address] Phone: [phone]   Fax:  [phone]  Physical Therapy Treatment And Discharge Summary   Patient Details  Name: Jessica Cole MRN: [id number] Date of Birth: Sep 01, 1950 Referring Provider (PT): Sharlet Salina, DO   Encounter Date: 03/31/2018  PT End of Session - 03/31/18 1038    Visit Number  13    Number of Visits  23    Date for PT Re-Evaluation  04/30/18    Authorization Type  3    Authorization Time Period  of 10 progress report    PT Start Time  1038    PT Stop Time  1120    PT Time Calculation (min)  42 min    Activity Tolerance  Patient tolerated treatment well    Behavior During Therapy  [facility] for tasks assessed/performed       Past Medical History:  Diagnosis Date  . Anxiety   . Colon polyps   . Degenerative arthritis   . Depression   . Diabetes mellitus without complication ([place])    Type II  . Environmental allergies   . Family history of adverse reaction to anesthesia    sister- PONV  . Fatty liver   . Glaucoma   . Headache(784.0)    HX  MIGRAINES  . History of bronchitis   . History of kidney stones   . History of pneumonia   . Hypertension   . Hypothyroidism   . Obesity   . OSA on CPAP   . Plantar fasciitis    right  . PONV (postoperative nausea and vomiting)   . Renal calculi   . Renal calculi   . RLS (restless legs syndrome) 08/23/2014    Past Surgical History:  Procedure Laterality Date  . ABDOMINAL HYSTERECTOMY     partial  . APPENDECTOMY    . Arthroscopic surgery, knee Left   . BREAST BIOPSY Right    several  . BREAST BIOPSY Right 03/11/2017   u/s bx neg  . BUNIONECTOMY     LEFT   . COLONOSCOPY W/ POLYPECTOMY    . COLONOSCOPY WITH PROPOFOL N/A 08/14/2017   Procedure: COLONOSCOPY WITH PROPOFOL;  Surgeon: Lollie Sails, MD;  [facility] END[facility]COPY;  Service: Endoscopy;  Laterality: N/A;  .  EXTRACORPOREAL SHOCK WAVE LITHOTRIPSY Left 05/14/2017   Procedure: EXTRACORPOREAL SHOCK WAVE LITHOTRIPSY (ESWL);  Surgeon: Abbie Sons, MD;  Location: [facility] ORS;  Service: Urology;  Laterality: Left;  . FOOT SURGERY     RIGHT    . KNEE ARTHROSCOPY W/ MENISCAL REPAIR Left   . LASIK    . MAXIMUM ACCESS (MAS)POSTERIOR LUMBAR INTERBODY FUSION (PLIF) 1 LEVEL N/A 04/25/2016   Procedure: LUMBAR FOUR-FIVE  MAXIMUM ACCESS (MAS) POSTERIOR LUMBAR INTERBODY FUSION (PLIF) with extension of instrumentation LUMBAR TWO-FIVE;  Surgeon: Eustace Moore, MD;  Location: [place];  Service: Neurosurgery;  Laterality: N/A;  . MAXIMUM ACCESS (MAS)POSTERIOR LUMBAR INTERBODY FUSION (PLIF) 2 LEVEL N/A 12/13/2015   Procedure: Lumbar two-three - Lumbar three-four MAXIMUM ACCESS (MAS) POSTERIOR LUMBAR INTERBODY FUSION (PLIF)  ;  Surgeon: Eustace Moore, MD;  [facility] NEURO ORS;  Service: Neurosurgery;  Laterality: N/A;  . SHOULDER SURGERY     RIGHT   . TONSILLECTOMY      There were no vitals filed for this visit.  Subjective Assessment - 03/31/18 1039    Subjective  Back has been better. 2/10 currently. Has been doing  some lifting. 3/10 back pain at most for the past 7 days. Doing her exercises at home some.     Pertinent History  Low back pain R > L. Pt initally had bursitis R posterior hip at ischial tuberosity area.  A shot made it go away. However pain returned about a week later. Had another shot R posterior hip which did not help.  R hip bursitis pain began around June 2019.  Pt then started having a tingly sensation R leg from knee to ankle all over.  Felt wierd.  Worsened as time progressed. Was given a muscle relaxer by Dr. Sharlet Salina.  Pt could not walk and had to hang onto furniture to move around.  Both legs got wobbly.  Took her tramadol for pain and both her legs gave way and fell right after she swollowed the pill this past Semptember 2019.   Was fine the next day.  The insurance will not let her have and MRI  until she does PT.       Patient Stated Goals  Improve flexibility    Currently in Pain?  Yes    Pain Score  2     Pain Onset  More than a month ago         [facility] PT Assessment - 03/31/18 1234      Observation/Other Assessments   Focus on Therapeutic Outcomes (FOTO)   lumbar spine FOTO: 41                           PT Education - 03/31/18 1046    Education provided  Yes    Education Details  ther-ex    [employer]) Educated  Patient    Methods  Explanation;Demonstration;Tactile cues;Verbal cues    Comprehension  Returned demonstration;Verbalized understanding         Objective     MedbridgeAccess Code: [email or web address]   Therapeutic exercise  Seated manually resisted trunk flexion isometrics 10x5 secondsfor 3 sets  Standing scapular retraction red band 10x3to promote thoracic extension  Standing LE leg press resisting double blue band 10x2, then 10x5 seconds each LE with B UE assist  Side stepping 20 ft to the R and 20 ft to the L 3x to promote glute med muscle strengthening  Standing straight pallof press resisting double yellow band 10x10 seconds for 3 sets  Standing hip abduction with B UE assist 2 lbs 10x3 each LE   Forward step up onto 4 inch step with one UE assist              R 10x3             L 10x3   Improved exercise technique, movement at target joints, use of target muscles after min to mod verbal, visual, tactile cues.   Pt demonstrates overall decreased back and R posterior hip pain, improved bilateral glute med and max strength, and improved function since initial evaluation. Pt able to perform all exercises today without complain of pain.  Pt has made good progress with PT towards goals. Skilled physical therapy services discharged with patient continueing progress with her exercises at home.       PT Short Term Goals - 03/31/18 1049      PT SHORT TERM GOAL #1   Title  Patient will be independent with her  HEP to promote ability to perform functional tasks more comfortably for her back and R LE.     Baseline  Pt has started her HEP. (02/09/2018); Pt has not been performing her HEP recently (03/17/2018); Pt has been performing her HEP some (03/31/2018)    Time  3    Period  Weeks    Status  Partially Met    Target Date  04/29/18        PT Long Term Goals - 03/31/18 1050      PT LONG TERM GOAL #1   Title  Patient will have a decrease in R low back/ hip pain to 4/10 or less at worst to improve ability to perform standing tasks, improve ability to ambulate longer distances, tolerate sitting for longer periods.     Baseline  7/10 back/R hip pain at worst for the past 3 months (02/09/2018); 8/10 at most (03/17/2018); 3/10 back pain at most for the past 7 days (03/31/2018)    Time  6    Period  Weeks    Status  Achieved    Target Date  04/29/18      PT LONG TERM GOAL #2   Title  Pt will improve her lumbar spine FOTO score by at least 10 points as a demonstration of improved function.     Baseline  Lumbar spine FOTO 53 (02/09/2018); 50 (03/17/2018); 56 (03/31/2018)    Time  6    Period  Weeks    Status  Partially Met    Target Date  04/29/18      PT LONG TERM GOAL #3   Title  Patient will improve bilateral hip abduction and extension strength by at least 1/2 MMT to promote ability to perform standing tasks with less back and R LE pain.     Time  6    Period  Weeks    Status  Achieved    Target Date  03/25/18            Plan - 03/31/18 1049    Clinical Impression Statement  Pt demonstrates overall decreased back and R posterior hip pain, improved bilateral glute med and max strength, and improved function since initial evaluation. Pt able to perform all exercises today without complain of pain.  Pt has made good progress with PT towards goals. Skilled physical therapy services discharged with patient continueing progress with her exercises at home.     History and Personal Factors  relevant to plan of care:  Back pain, hx of back surgeries, poor posture, multiple comorbidities, age    Clinical Presentation  Stable    Clinical Presentation due to:  Pt has made good progress with PT towards goals.     Clinical Decision Making  Low    Rehab Potential  Fair    Clinical Impairments Affecting Rehab Potential  (-) comorbidities, hip weakness, age, back and R LE pain and symptoms; (+) motivated    PT Frequency  --    PT Duration  --    PT Treatment/Interventions  Therapeutic activities;Therapeutic exercise;Neuromuscular re-education;Patient/family education;Manual techniques    PT Next Visit Plan  continue progress with her HEP    Consulted and Agree with Plan of Care  Patient       Patient will benefit from skilled therapeutic intervention in order to improve the following deficits and impairments:  Pain, Postural dysfunction, Improper body mechanics, Difficulty walking, Decreased strength  Visit Diagnosis: Low back pain, unspecified back pain laterality, unspecified chronicity, unspecified whether sciatica present  Muscle weakness (generalized)  Pain in right hip     Problem List Patient Active Problem List  Diagnosis Date Noted  . Chronic cough 11/19/2016  . Morbid obesity ([place]) 09/26/2016  . Left ovarian cyst 05/30/2016  . S/P lumbar spinal fusion 12/13/2015  . Aortic calcification ([place]) 07/18/2015  . Controlled type 2 diabetes mellitus without complication ([place]) 65/79/0383  . Thrombocytopenia ([place]) 05/16/2015  . Recurrent major depressive disorder, in full remission ([place]) 05/16/2015  . Essential (primary) hypertension 05/16/2015  . Fatty infiltration of liver 03/15/2015  . Aortic valve stenosis, nonrheumatic 01/18/2015  . Sciatica of right side 01/09/2015  . Type 2 diabetes mellitus ([place]) 11/10/2014  . RLS (restless legs syndrome) 08/23/2014  . Neuritis or radiculitis due to rupture of lumbar intervertebral disc 06/13/2014  . Degeneration of  intervertebral disc of lumbar region 06/13/2014  . Lumbar radiculitis 06/13/2014  . Arthritis 01/23/2014  . Arthritis of knee, degenerative 11/15/2013  . Depression 09/29/2013  . Insomnia 09/29/2013  . Apnea, sleep 08/29/2013  . Calculus of kidney 08/29/2013  . Abnormal presence of protein in urine 08/29/2013  . Headache, migraine 08/29/2013  . BP (high blood pressure) 08/29/2013  . HLD (hyperlipidemia) 08/29/2013  . Allergic rhinitis 08/29/2013  . Intractable migraine without aura 02/18/2013   Thank you for your referral.  Joneen Boers PT, DPT   03/31/2018, 12:48 PM  [place] PHYSICAL AND SPORTS MEDICINE [address] [place], [address] Phone: [phone]   Fax:  [phone]  Name: Jessica Cole MRN: [id number] Date of Birth: 1950-05-25

## 2018-04-07 ENCOUNTER — Encounter: Payer: Self-pay | Admitting: Adult Health

## 2018-04-07 ENCOUNTER — Ambulatory Visit: Payer: Medicare HMO | Admitting: Adult Health

## 2018-04-07 VITALS — BP 132/79 | HR 57 | Ht 61.0 in | Wt 194.0 lb

## 2018-04-07 DIAGNOSIS — G43009 Migraine without aura, not intractable, without status migrainosus: Secondary | ICD-10-CM

## 2018-04-07 DIAGNOSIS — G2581 Restless legs syndrome: Secondary | ICD-10-CM | POA: Diagnosis not present

## 2018-04-07 NOTE — Patient Instructions (Signed)
Your Plan:  Continue Topamax, metoprolol and Gabapentin Continue Requip If your symptoms worsen or you develop new symptoms please let us know.   [facility]. I hope we have been able to provide you high quality care today.  You may receive a patient satisfaction survey over the next few weeks. We would appreciate your feedback and comments so that we may continue to improve ourselves and the health of our patients.

## 2018-04-07 NOTE — Progress Notes (Signed)
PATIENT: Jessica Cole DOB: 1950-06-23  REASON FOR VISIT: follow up HISTORY FROM: patient  HISTORY OF PRESENT ILLNESS: Today 04/07/18: Jessica Cole is a 67 year old female with a history of migraine headaches, restless leg syndrome.  She returns today for follow-up.  She states that her insurance will no longer cover Aimovig.  She was not willing to disclose her financial information in order to see if she qualify for financial assistance.  Therefore she is now on Topamax, gabapentin and metoprolol.  She states that her headache frequency increased slightly but that has improved over time.  She states that her restless leg is a little worse but she also has a bulging disc in his receiving injections from a pain specialist.  She is currently on Requip.  She returns today for evaluation.   HISTORY (copied from Dr. Clarisa Kindred note) Jessica Cole is a 67 year old right-handed white female with a history of diabetes, obesity, migraine headaches, and restless leg syndrome.  The patient indicates that she is now on Aimovig, this has been very helpful in reducing her headaches.  She is not sure whether or not she is taking Topamax currently.  Her headache diary indicates that she is only having 1 or 2 headaches a month now, and headaches are very mild usually.  The patient is not having significant issues with a restless leg syndrome, she is usually sleeping fairly well.  She has developed some pain in the buttocks, right greater than left, she will be seeing Dr. Yetta Barre from neurosurgery in the near future.  She has had an episode of what sounds like a panic attack recently, she has not had any recurrence.  She is on BuSpar.  She gets metoprolol, Aimovig, gabapentin, Requip, Topamax, and Ultram through this office.   REVIEW OF SYSTEMS: Out of a complete 14 system review of symptoms, the patient complains only of the following symptoms, and all other reviewed systems are negative.  Frequency of urination,  bruise/bleed easily, headache, joint pain, joint swelling, back pain, neck stiffness, moles, itching, restless leg, apnea, frequent waking, daytime sleepiness, snoring, sleep talking, constipation  ALLERGIES: Allergies  Allergen Reactions  . Diazepam     hallucinations  . Dexamethasone Other (See Comments)    During a tapered dose, once.  Was shaky (side effect, not allergy).    HOME MEDICATIONS: Outpatient Medications Prior to Visit  Medication Sig Dispense Refill  . AIMOVIG 140 DOSE 70 MG/ML SOAJ INJECT 140 MG INTO THE SKIN EVERY 30 DAYS (Patient not taking: Reported on 02/09/2018) 2 pen 3  . albuterol (PROVENTIL HFA;VENTOLIN HFA) 108 (90 Base) MCG/ACT inhaler Inhale into the lungs.    . ASPIRIN 81 PO Take by mouth.    Marland Kitchen azelastine (ASTELIN) 0.1 % nasal spray Place 1 spray into both nostrils 2 (two) times daily.     Marland Kitchen BREO ELLIPTA 200-25 MCG/INH AEPB Take 1 puff daily by mouth.    . busPIRone (BUSPAR) 7.5 MG tablet Take 7.5 mg 2 (two) times daily as needed by mouth (chest pain).    Marland Kitchen diclofenac sodium (VOLTAREN) 1 % GEL Apply 4 g topically 2 (two) times daily. (Patient taking differently: Apply 4 g topically daily as needed (for pain). ) 100 g 1  . enalapril (VASOTEC) 10 MG tablet Take 10 mg by mouth daily.    . fexofenadine (ALLER-EASE) 180 MG tablet Take 180 mg by mouth daily.    . fluticasone (FLONASE) 50 MCG/ACT nasal spray Place 2 sprays into both nostrils 2 (  two) times daily.     . furosemide (LASIX) 20 MG tablet Take 20 mg by mouth daily as needed.    . gabapentin (NEURONTIN) 800 MG tablet TAKE ONE (1) TABLET BY MOUTH TWO (2) TIMES DAILY 180 tablet 3  . ketoconazole (NIZORAL) 2 % cream APPLY TO AFFECTED AREA EVERY DAY  0  . latanoprost (XALATAN) 0.005 % ophthalmic solution Place 1 drop into both eyes at bedtime.     Marland Kitchen levothyroxine (SYNTHROID, LEVOTHROID) 112 MCG tablet Take 112 mcg by mouth daily before breakfast.     . losartan (COZAAR) 50 MG tablet Take 50 mg by mouth every  evening.     . meloxicam (MOBIC) 7.5 MG tablet Take 7.5 mg by mouth daily.    . metFORMIN (GLUCOPHAGE) 500 MG tablet Take 500 mg by mouth 2 (two) times daily with a meal.     . metoprolol succinate (TOPROL-XL) 25 MG 24 hr tablet Take 1 tablet (25 mg total) by mouth daily. 90 tablet 3  . montelukast (SINGULAIR) 10 MG tablet Take 10 mg at bedtime by mouth.    . Multiple Vitamins-Minerals (MULTIVITAMIN ADULT PO) Take by mouth daily.    . ONE TOUCH ULTRA TEST test strip USE 2 (TWO) TIMES DAILY USE AS INSTRUCTED.  3  . oxybutynin (DITROPAN) 5 MG tablet Take 5 mg by mouth 2 (two) times daily.    . potassium chloride (K-DUR,KLOR-CON) 10 MEQ tablet Take 10 mEq by mouth daily as needed.    . predniSONE (DELTASONE) 5 MG tablet TAKE 6 TABLETS BY MOUTH ON DAY 1, THEN DECREASE BY 1 TABLET DAILY UNTIL GONE (6,5,4,3,2,1)  0  . Red Yeast Rice 600 MG CAPS Take 1,200 mg by mouth 2 (two) times daily.    Marland Kitchen rOPINIRole (REQUIP) 4 MG tablet Take 1 tablet (4 mg total) by mouth 2 (two) times daily. 180 tablet 3  . tiZANidine (ZANAFLEX) 4 MG tablet TAKE 1/2 TO 1 TABLET BY MOUTH TWICE A DAY AS NEEDED  5  . topiramate (TOPAMAX) 100 MG tablet TAKE ONE TABLET BY MOUTH EVERY DAY AT BEDTIME 30 tablet 3  . traMADol (ULTRAM) 50 MG tablet Take 1 tablet (50 mg total) by mouth every 6 (six) hours as needed. Must last 28 days 50 tablet 1   No facility-administered medications prior to visit.     PAST MEDICAL HISTORY: Past Medical History:  Diagnosis Date  . Anxiety   . Colon polyps   . Degenerative arthritis   . Depression   . Diabetes mellitus without complication (HCC)    Type II  . Environmental allergies   . Family history of adverse reaction to anesthesia    sister- PONV  . Fatty liver   . Glaucoma   . Headache(784.0)    HX  MIGRAINES  . History of bronchitis   . History of kidney stones   . History of pneumonia   . Hypertension   . Hypothyroidism   . Obesity   . OSA on CPAP   . Plantar fasciitis    right    . PONV (postoperative nausea and vomiting)   . Renal calculi   . Renal calculi   . RLS (restless legs syndrome) 08/23/2014    PAST SURGICAL HISTORY: Past Surgical History:  Procedure Laterality Date  . ABDOMINAL HYSTERECTOMY     partial  . APPENDECTOMY    . Arthroscopic surgery, knee Left   . BREAST BIOPSY Right    several  . BREAST BIOPSY Right 03/11/2017  u/s bx neg  . BUNIONECTOMY     LEFT   . COLONOSCOPY W/ POLYPECTOMY    . COLONOSCOPY WITH PROPOFOL N/A 08/14/2017   Procedure: COLONOSCOPY WITH PROPOFOL;  Surgeon: Christena Deem, MD;  Location: [facility] ENDOSCOPY;  Service: Endoscopy;  Laterality: N/A;  . EXTRACORPOREAL SHOCK WAVE LITHOTRIPSY Left 05/14/2017   Procedure: EXTRACORPOREAL SHOCK WAVE LITHOTRIPSY (ESWL);  Surgeon: Riki Altes, MD;  Location: [facility] ORS;  Service: Urology;  Laterality: Left;  . FOOT SURGERY     RIGHT    . KNEE ARTHROSCOPY W/ MENISCAL REPAIR Left   . LASIK    . MAXIMUM ACCESS (MAS)POSTERIOR LUMBAR INTERBODY FUSION (PLIF) 1 LEVEL N/A 04/25/2016   Procedure: LUMBAR FOUR-FIVE  MAXIMUM ACCESS (MAS) POSTERIOR LUMBAR INTERBODY FUSION (PLIF) with extension of instrumentation LUMBAR TWO-FIVE;  Surgeon: Tia Alert, MD;  Location: [facility];  Service: Neurosurgery;  Laterality: N/A;  . MAXIMUM ACCESS (MAS)POSTERIOR LUMBAR INTERBODY FUSION (PLIF) 2 LEVEL N/A 12/13/2015   Procedure: Lumbar two-three - Lumbar three-four MAXIMUM ACCESS (MAS) POSTERIOR LUMBAR INTERBODY FUSION (PLIF)  ;  Surgeon: Tia Alert, MD;  Location: [facility] NEURO ORS;  Service: Neurosurgery;  Laterality: N/A;  . SHOULDER SURGERY     RIGHT   . TONSILLECTOMY      FAMILY HISTORY: Family History  Problem Relation Age of Onset  . Lung cancer Mother   . Congestive Heart Failure Father   . Depression Sister   . Rheum arthritis Sister   . Headache Maternal Grandfather   . Migraines Maternal Grandfather   . Headache Maternal Uncle   . Diabetes Unknown   . Heart disease Unknown   .  Hypertension Unknown   . Breast cancer Neg Hx   . Bladder Cancer Neg Hx   . Kidney cancer Neg Hx     SOCIAL HISTORY: Social History   Socioeconomic History  . Marital status: Married    Spouse name: Milinda Cave   . Number of children: 0  . Years of education: 42  . Highest education level: Not on file  Occupational History  . Occupation: retired  Engineer, production  . Financial resource strain: Not on file  . Food insecurity:    Worry: Not on file    Inability: Not on file  . Transportation needs:    Medical: Not on file    Non-medical: Not on file  Tobacco Use  . Smoking status: Never Smoker  . Smokeless tobacco: Never Used  Substance and Sexual Activity  . Alcohol use: No  . Drug use: No  . Sexual activity: Not on file  Lifestyle  . Physical activity:    Days per week: Not on file    Minutes per session: Not on file  . Stress: Not on file  Relationships  . Social connections:    Talks on phone: Not on file    Gets together: Not on file    Attends religious service: Not on file    Active member of club or organization: Not on file    Attends meetings of clubs or organizations: Not on file    Relationship status: Not on file  . Intimate partner violence:    Fear of current or ex partner: Not on file    Emotionally abused: Not on file    Physically abused: Not on file    Forced sexual activity: Not on file  Other Topics Concern  . Not on file  Social History Narrative   Patient is married and lives  with her husband.   Patient has a high school education.   Patient has no children.    Patient works for the [employer]   Patient is right handed.   Patient drinks occasionally drinks caffeine.      PHYSICAL EXAM  Vitals:   04/07/18 0746  BP: 132/79  Pulse: (!) 57  Weight: 194 lb (88 kg)  Height: 5\' 1"  (1.549 m)   Body mass index is 36.66 kg/m.  Generalized: Well developed, in no acute distress   Neurological examination  Mentation: Alert oriented  to time, place, history taking. Follows all commands speech and language fluent Cranial nerve II-XII: Pupils were equal round reactive to light. Extraocular movements were full, visual field were full on confrontational test. Facial sensation and strength were normal. Uvula tongue midline. Head turning and shoulder shrug  were normal and symmetric. Motor: The motor testing reveals 5 over 5 strength of all 4 extremities. Good symmetric motor tone is noted throughout.  Sensory: Sensory testing is intact to soft touch on all 4 extremities. No evidence of extinction is noted.  Coordination: Cerebellar testing reveals good finger-nose-finger and heel-to-shin bilaterally.  Gait and station: Gait is normal.  Reflexes: Deep tendon reflexes are symmetric and normal bilaterally.   DIAGNOSTIC DATA (LABS, IMAGING, TESTING) - I reviewed patient records, labs, notes, testing and imaging myself where available.  Lab Results  Component Value Date   WBC 9.6 05/09/2017   HGB 14.0 05/09/2017   HCT 41.2 05/09/2017   MCV 92.1 05/09/2017   PLT 176 05/09/2017      Component Value Date/Time   NA 137 05/09/2017 1147   NA 138 06/08/2013 1558   K 4.0 05/09/2017 1147   K 4.1 06/08/2013 1558   CL 108 05/09/2017 1147   CL 106 06/08/2013 1558   CO2 23 05/09/2017 1147   CO2 29 06/08/2013 1558   GLUCOSE 199 (H) 05/09/2017 1147   GLUCOSE 93 06/08/2013 1558   BUN 15 05/09/2017 1147   BUN 11 06/08/2013 1558   CREATININE 1.14 (H) 05/09/2017 1147   CREATININE 0.64 06/08/2013 1558   CALCIUM 9.5 05/09/2017 1147   CALCIUM 9.6 06/08/2013 1558   PROT 7.4 05/09/2017 1147   PROT 7.5 08/01/2011 1307   ALBUMIN 4.4 05/09/2017 1147   ALBUMIN 4.3 08/01/2011 1307   AST 31 05/09/2017 1147   AST 47 (H) 08/01/2011 1307   ALT 35 05/09/2017 1147   ALT 69 08/01/2011 1307   ALKPHOS 55 05/09/2017 1147   ALKPHOS 69 08/01/2011 1307   BILITOT 0.6 05/09/2017 1147   BILITOT 0.4 08/01/2011 1307   GFRNONAA 48 (L) 05/09/2017 1147    GFRNONAA >60 06/08/2013 1558   GFRAA 56 (L) 05/09/2017 1147   GFRAA >60 06/08/2013 1558   Lab Results  Component Value Date   CHOL 225 (H) 08/02/2011   HDL 32 (L) 08/02/2011   LDLCALC 159 (H) 08/02/2011   TRIG 171 08/02/2011   Lab Results  Component Value Date   HGBA1C 6.6 (H) 04/18/2016   No results found for: VITAMINB12 No results found for: TSH    ASSESSMENT AND PLAN 67 y.o. year old female  has a past medical history of Anxiety, Colon polyps, Degenerative arthritis, Depression, Diabetes mellitus without complication (HCC), Environmental allergies, Family history of adverse reaction to anesthesia, Fatty liver, Glaucoma, Headache(784.0), History of bronchitis, History of kidney stones, History of pneumonia, Hypertension, Hypothyroidism, Obesity, OSA on CPAP, Plantar fasciitis, PONV (postoperative nausea and vomiting), Renal calculi, Renal calculi, and  RLS (restless legs syndrome) (08/23/2014). here with:  1.  Migraine headaches 2.  Restless leg syndrome  The patient will continue on Topamax, gabapentin and metoprolol for her migraines.  She is not interested in filling out financial assistance forms for Aimovig.  We will continue to monitor her headaches.  She will continue on Requip for restless leg syndrome.  She is advised that if her symptoms worsen or she develops new symptoms she should let us know.  She will follow-up in 6 months or sooner if needed.     Butch PennyMegan Susan Bleich, MSN, NP-C 04/07/2018, 7:38 AM [facility] [address], [address] [place], [place] [address] [phone]

## 2018-04-19 ENCOUNTER — Other Ambulatory Visit: Payer: Self-pay | Admitting: Physical Medicine and Rehabilitation

## 2018-04-19 ENCOUNTER — Ambulatory Visit
Admission: RE | Admit: 2018-04-19 | Discharge: 2018-04-19 | Disposition: A | Discharge status: Home / Self Care | Payer: Medicare HMO | Source: Ambulatory Visit | Attending: Physical Medicine and Rehabilitation | Admitting: Physical Medicine and Rehabilitation

## 2018-04-19 DIAGNOSIS — M5416 Radiculopathy, lumbar region: Secondary | ICD-10-CM

## 2018-04-19 MED ORDER — IOPAMIDOL (ISOVUE-M 200) INJECTION 41%
1.0000 mL | Freq: Once | INTRAMUSCULAR | Status: AC
  Administered 2018-04-19: 1 mL via EPIDURAL

## 2018-04-19 MED ORDER — METHYLPREDNISOLONE ACETATE 40 MG/ML INJ SUSP (RADIOLOG
120.0000 mg | Freq: Once | INTRAMUSCULAR | Status: AC
  Administered 2018-04-19: 120 mg via EPIDURAL

## 2018-04-19 NOTE — Discharge Instructions (Signed)

## 2018-04-26 ENCOUNTER — Other Ambulatory Visit: Payer: Self-pay | Admitting: Adult Health

## 2018-04-27 ENCOUNTER — Other Ambulatory Visit: Payer: Self-pay | Admitting: Adult Health

## 2018-06-24 ENCOUNTER — Other Ambulatory Visit: Payer: Self-pay | Admitting: Physical Medicine and Rehabilitation

## 2018-06-24 DIAGNOSIS — M5416 Radiculopathy, lumbar region: Secondary | ICD-10-CM

## 2018-06-28 ENCOUNTER — Other Ambulatory Visit: Payer: Self-pay | Admitting: Adult Health

## 2018-07-12 ENCOUNTER — Ambulatory Visit
Admission: RE | Admit: 2018-07-12 | Discharge: 2018-07-12 | Disposition: A | Discharge status: Home / Self Care | Payer: Medicare HMO | Source: Ambulatory Visit | Attending: Physical Medicine and Rehabilitation | Admitting: Physical Medicine and Rehabilitation

## 2018-07-12 ENCOUNTER — Other Ambulatory Visit: Payer: Self-pay | Admitting: Physical Medicine and Rehabilitation

## 2018-07-12 DIAGNOSIS — M5416 Radiculopathy, lumbar region: Secondary | ICD-10-CM

## 2018-07-12 MED ORDER — METHYLPREDNISOLONE ACETATE 40 MG/ML INJ SUSP (RADIOLOG
120.0000 mg | Freq: Once | INTRAMUSCULAR | Status: AC
  Administered 2018-07-12: 120 mg via EPIDURAL

## 2018-07-12 MED ORDER — IOPAMIDOL (ISOVUE-M 200) INJECTION 41%
1.0000 mL | Freq: Once | INTRAMUSCULAR | Status: AC
  Administered 2018-07-12: 1 mL via EPIDURAL

## 2018-07-12 NOTE — Discharge Instructions (Signed)

## 2018-09-08 ENCOUNTER — Other Ambulatory Visit: Payer: Self-pay | Admitting: Adult Health

## 2018-10-25 ENCOUNTER — Encounter: Payer: Self-pay | Admitting: Neurology

## 2018-10-25 ENCOUNTER — Ambulatory Visit: Payer: Medicare HMO | Admitting: Neurology

## 2018-10-25 ENCOUNTER — Telehealth: Payer: Self-pay | Admitting: Neurology

## 2018-10-25 ENCOUNTER — Other Ambulatory Visit: Payer: Self-pay

## 2018-10-25 VITALS — BP 163/82 | HR 86 | Temp 97.7°F | Ht 61.0 in | Wt 197.0 lb

## 2018-10-25 DIAGNOSIS — G2581 Restless legs syndrome: Secondary | ICD-10-CM

## 2018-10-25 DIAGNOSIS — G43019 Migraine without aura, intractable, without status migrainosus: Secondary | ICD-10-CM

## 2018-10-25 MED ORDER — TRAMADOL HCL 50 MG PO TABS: 50.0000 mg | ORAL_TABLET | Freq: Four times a day (QID) | ORAL | 1 refills | Status: DC | PRN

## 2018-10-25 MED ORDER — METOPROLOL SUCCINATE ER 50 MG PO TB24: 50.0000 mg | ORAL_TABLET | Freq: Every day | ORAL | 3 refills | Status: DC

## 2018-10-25 NOTE — Patient Instructions (Signed)
We will increase the Toprol to 50 mg a day.

## 2018-10-25 NOTE — Telephone Encounter (Signed)
Noted, updates placed in the pt's chart.

## 2018-10-25 NOTE — Telephone Encounter (Signed)
Patient called and stated that Dr. Jannifer Franklin wanted her to call when she got home to update her med list. She is no longer on Rx Montelukast Sodium 10 MG she is also off of the potassium but instead she is taking Telmifartan . Please call if you have any questions.

## 2018-10-25 NOTE — Progress Notes (Signed)
Reason for visit: Migraine headache, restless leg syndrome  Jessica Cole is an 68 y.o. female  History of present illness:  Jessica Cole is a 68 year old right-handed white female with a history of diabetes, obesity, migraine headache, degenerative arthritis, and restless leg syndrome.  The patient had been on Topamax, she is not clear that she is on the medication currently.  She indicates that her severity of the migraine as lessened, but her frequency is increased, she may have 3-4 headaches a month, but the headaches may last all day long.  She takes Ultram as needed for the headache.  She takes ropinirole for her restless leg syndrome, she still does not sleep well because she is under stress she indicates.  She has a topical ointment that she has purchased online that also helps her restless leg syndrome.  She returns to this office for an evaluation.  Past Medical History:  Diagnosis Date  . Anxiety   . Colon polyps   . Degenerative arthritis   . Depression   . Diabetes mellitus without complication ([place])    Type II  . Environmental allergies   . Family history of adverse reaction to anesthesia    sister- PONV  . Fatty liver   . Glaucoma   . Headache(784.0)    HX  MIGRAINES  . History of bronchitis   . History of kidney stones   . History of pneumonia   . Hypertension   . Hypothyroidism   . Obesity   . OSA on CPAP   . Plantar fasciitis    right  . PONV (postoperative nausea and vomiting)   . Renal calculi   . Renal calculi   . RLS (restless legs syndrome) 08/23/2014    Past Surgical History:  Procedure Laterality Date  . ABDOMINAL HYSTERECTOMY     partial  . APPENDECTOMY    . Arthroscopic surgery, knee Left   . BREAST BIOPSY Right    several  . BREAST BIOPSY Right 03/11/2017   u/s bx neg  . BUNIONECTOMY     LEFT   . COLONOSCOPY W/ POLYPECTOMY    . COLONOSCOPY WITH PROPOFOL N/A 08/14/2017   Procedure: COLONOSCOPY WITH PROPOFOL;  Surgeon: Lollie Sails, MD;  Location[facility]COPY;  Service: Endoscopy;  Laterality: N/A;  . EXTRACORPOREAL SHOCK WAVE LITHOTRIPSY Left 05/14/2017   Procedure: EXTRACORPOREAL SHOCK WAVE LITHOTRIPSY (ESWL);  Surgeon: Abbie Sons, MD;  Location: [facility] ORS;  Service: Urology;  Laterality: Left;  . FOOT SURGERY     RIGHT    . KNEE ARTHROSCOPY W/ MENISCAL REPAIR Left   . LASIK    . MAXIMUM ACCESS (MAS)POSTERIOR LUMBAR INTERBODY FUSION (PLIF) 1 LEVEL N/A 04/25/2016   Procedure: LUMBAR FOUR-FIVE  MAXIMUM ACCESS (MAS) POSTERIOR LUMBAR INTERBODY FUSION (PLIF) with extension of instrumentation LUMBAR TWO-FIVE;  Surgeon: Eustace Moore, MD;  Location: [place];  Service: Neurosurgery;  Laterality: N/A;  . MAXIMUM ACCESS (MAS)POSTERIOR LUMBAR INTERBODY FUSION (PLIF) 2 LEVEL N/A 12/13/2015   Procedure: Lumbar two-three - Lumbar three-four MAXIMUM ACCESS (MAS) POSTERIOR LUMBAR INTERBODY FUSION (PLIF)  ;  Surgeon: Eustace Moore, MD;  Location: [facility] NEURO ORS;  Service: Neurosurgery;  Laterality: N/A;  . SHOULDER SURGERY     RIGHT   . TONSILLECTOMY      Family History  Problem Relation Age of Onset  . Lung cancer Mother   . Congestive Heart Failure Father   . Depression Sister   . Rheum arthritis Sister   .  Headache Maternal Grandfather   . Migraines Maternal Grandfather   . Headache Maternal Uncle   . Diabetes Unknown   . Heart disease Unknown   . Hypertension Unknown   . Breast cancer Neg Hx   . Bladder Cancer Neg Hx   . Kidney cancer Neg Hx     Social history:  reports that she has never smoked. She has never used smokeless tobacco. She reports that she does not drink alcohol or use drugs.    Allergies  Allergen Reactions  . Diazepam Other (See Comments)    hallucinations  . Dexamethasone Other (See Comments)    During a tapered dose, once.  Was shaky (side effect, not allergy).    Medications:  Prior to Admission medications   Medication Sig Start Date End Date Taking? Authorizing Provider  albuterol  (PROVENTIL HFA;VENTOLIN HFA) 108 (90 Base) MCG/ACT inhaler Inhale into the lungs.   Yes [provider]  ASPIRIN 81 PO Take by mouth.   Yes [provider]  azelastine (ASTELIN) 0.1 % nasal spray Place 1 spray into both nostrils 2 (two) times daily.  11/23/15  Yes [provider]  BREO ELLIPTA 200-25 MCG/INH AEPB Take 1 puff daily by mouth. 03/05/17  Yes [provider]  Continuous Blood Gluc Receiver (FREESTYLE LIBRE READER) DEVI Use 1 kit as directed 04/09/18  Yes [provider]  Continuous Blood Gluc Sensor (FREESTYLE LIBRE 14 DAY SENSOR) MISC Use 1 kit as directed 04/09/18  Yes [provider]  diclofenac sodium (VOLTAREN) 1 % GEL Apply 4 g topically 2 (two) times daily. Patient taking differently: Apply 4 g topically daily as needed (for pain).  08/28/14  Yes Hyatt, Max T, DPM  fexofenadine (ALLER-EASE) 180 MG tablet Take 180 mg by mouth daily.   Yes [provider]  fluticasone (FLONASE) 50 MCG/ACT nasal spray Place 2 sprays into both nostrils 2 (two) times daily.  04/12/14  Yes [provider]  gabapentin (NEURONTIN) 800 MG tablet TAKE 1 TABLET BY MOUTH TWICE A DAY 04/26/18  Yes Millikan, Megan, NP  latanoprost (XALATAN) 0.005 % ophthalmic solution Place 1 drop into both eyes at bedtime.    Yes [provider]  levothyroxine (SYNTHROID, LEVOTHROID) 112 MCG tablet Take 112 mcg by mouth daily before breakfast.  11/07/15  Yes [provider]  losartan (COZAAR) 50 MG tablet Take 50 mg by mouth every evening.    Yes [provider]  meloxicam (MOBIC) 7.5 MG tablet Take 7.5 mg by mouth daily.   Yes [provider]  metFORMIN (GLUCOPHAGE) 500 MG tablet Take 500 mg by mouth 2 (two) times daily with a meal.    Yes [provider]  metoprolol succinate (TOPROL-XL) 25 MG 24 hr tablet Take 1 tablet (25 mg total) by mouth daily. 09/23/17  Yes Kathrynn Ducking, MD  ONE TOUCH ULTRA TEST test strip  USE 2 (TWO) TIMES DAILY USE AS INSTRUCTED. 11/13/17  Yes [provider]  oxybutynin (DITROPAN) 5 MG tablet Take 5 mg by mouth 2 (two) times daily.   Yes [provider]  rOPINIRole (REQUIP) 4 MG tablet TAKE 1 TABLET BY MOUTH TWICE A DAY 09/08/18  Yes Kathrynn Ducking, MD  topiramate (TOPAMAX) 100 MG tablet TAKE ONE TABLET BY MOUTH EVERY DAY AT BEDTIME 06/28/18  Yes Ward Givens, NP  traMADol (ULTRAM) 50 MG tablet Take 1 tablet (50 mg total) by mouth every 6 (six) hours as needed. Must last 28 days 09/23/17  Yes Jannifer Franklin,  Elon Alas, MD    ROS:  Out of a complete 14 system review of symptoms, the patient complains only of the following symptoms, and all other reviewed systems are negative.  Headache Insomnia Degenerative arthritis  Blood pressure (!) 163/82, pulse 86, temperature 97.7 F (36.5 C), temperature source Oral, height '5\' 1"'$  (1.549 m), weight 197 lb (89.4 kg).  Physical Exam  General: The patient is alert and cooperative at the time of the examination.  The patient is markedly obese.  Skin: 1+ edema below the knees is seen bilaterally.   Neurologic Exam  Mental status: The patient is alert and oriented x 3 at the time of the examination. The patient has apparent normal recent and remote memory, with an apparently normal attention span and concentration ability.   Cranial nerves: Facial symmetry is present. Speech is normal, no aphasia or dysarthria is noted. Extraocular movements are full. Visual fields are full.  Motor: The patient has good strength in all 4 extremities.  Sensory examination: Soft touch sensation is symmetric on the face, arms, and legs.  Coordination: The patient has good finger-nose-finger and heel-to-shin bilaterally.  Gait and station: The patient has a normal gait. Tandem gait is normal. Romberg is negative. No drift is seen.  Reflexes: Deep tendon reflexes are symmetric, but are depressed.   Assessment/Plan:  1.  Intractable  migraine  2.  Restless leg syndrome  3.  Degenerative arthritis, left knee arthritis  The patient has significant pain in the left leg associated arthritis of the knee.  She is still having frequent headaches, we will go up on the Toprol dose to help treat the headache as well as her blood pressure.  The patient will take 50 mg of Toprol daily.  In the past, Aimovig was effective in controlling her headaches but she cannot afford the medication.  A prescription was given for Ultram.  She will follow-up in 6 months.  She will remain on her ropinirole and gabapentin.  Jill Alexanders MD 10/25/2018 8:19 AM  [facility] [address] [place], [place] [address]  Phone [phone] Fax [phone]

## 2018-10-26 ENCOUNTER — Other Ambulatory Visit: Payer: Self-pay | Admitting: Physical Medicine and Rehabilitation

## 2018-10-26 DIAGNOSIS — M5416 Radiculopathy, lumbar region: Secondary | ICD-10-CM

## 2018-11-09 ENCOUNTER — Other Ambulatory Visit: Payer: Self-pay

## 2018-11-09 ENCOUNTER — Ambulatory Visit
Admission: RE | Admit: 2018-11-09 | Discharge: 2018-11-09 | Disposition: A | Discharge status: Home / Self Care | Payer: Medicare HMO | Source: Ambulatory Visit | Attending: Physical Medicine and Rehabilitation | Admitting: Physical Medicine and Rehabilitation

## 2018-11-09 DIAGNOSIS — M5416 Radiculopathy, lumbar region: Secondary | ICD-10-CM

## 2018-11-09 MED ORDER — METHYLPREDNISOLONE ACETATE 40 MG/ML INJ SUSP (RADIOLOG
120.0000 mg | Freq: Once | INTRAMUSCULAR | Status: AC
  Administered 2018-11-09: 08:00:00 120 mg via EPIDURAL

## 2018-11-09 MED ORDER — IOPAMIDOL (ISOVUE-M 200) INJECTION 41%
1.0000 mL | Freq: Once | INTRAMUSCULAR | Status: AC
  Administered 2018-11-09: 08:00:00 1 mL via EPIDURAL

## 2018-11-09 NOTE — Discharge Instructions (Signed)

## 2018-11-22 ENCOUNTER — Other Ambulatory Visit: Payer: Self-pay | Admitting: Neurology

## 2018-12-01 ENCOUNTER — Other Ambulatory Visit: Payer: Self-pay | Admitting: Neurology

## 2018-12-15 ENCOUNTER — Other Ambulatory Visit: Payer: Self-pay | Admitting: Adult Health

## 2019-01-03 ENCOUNTER — Other Ambulatory Visit: Payer: Self-pay | Admitting: Urology

## 2019-01-03 DIAGNOSIS — E113293 Type 2 diabetes mellitus with mild nonproliferative diabetic retinopathy without macular edema, bilateral: Secondary | ICD-10-CM | POA: Insufficient documentation

## 2019-01-07 ENCOUNTER — Other Ambulatory Visit: Payer: Self-pay | Admitting: Adult Health

## 2019-01-11 ENCOUNTER — Other Ambulatory Visit: Payer: Self-pay | Admitting: Adult Health

## 2019-01-12 ENCOUNTER — Encounter: Payer: Self-pay | Admitting: Podiatry

## 2019-01-12 ENCOUNTER — Ambulatory Visit (INDEPENDENT_AMBULATORY_CARE_PROVIDER_SITE_OTHER): Payer: Medicare HMO | Admitting: Podiatry

## 2019-01-12 ENCOUNTER — Other Ambulatory Visit: Payer: Self-pay

## 2019-01-12 DIAGNOSIS — E119 Type 2 diabetes mellitus without complications: Secondary | ICD-10-CM | POA: Diagnosis not present

## 2019-01-12 NOTE — Progress Notes (Signed)
She presents today for her annual diabetic evaluation states that her latest A1c is at 6.6.  She denies any changes in her past medical history medications allergies surgery social history and review of systems.  Denies any changes in her feet.  Objective: Vital signs are stable she is alert and oriented x3 pulses are strongly palpable.  Neurologic sensorium is intact per Semmes Weinstein monofilament deep tendon reflexes are intact bilateral.  Muscle strength +5/5 dorsiflexors plantar flexors inverters everters all intrinsic musculature is intact.  Orthopedic evaluation demonstrates all joints distal to the ankle full range of motion but crepitation.  Cutaneous evaluation of straits supple well-hydrated cutis no erythema edema cellulitis drainage or odor no open lesions or wounds.  Assessment: Non-complicated diabetes bilateral.  Plan: Normal foot exam.  Follow-up with me as needed

## 2019-01-17 ENCOUNTER — Ambulatory Visit: Payer: Medicare HMO | Admitting: Podiatry

## 2019-01-19 ENCOUNTER — Other Ambulatory Visit: Payer: Self-pay | Admitting: Urology

## 2019-01-19 DIAGNOSIS — Z87442 Personal history of urinary calculi: Secondary | ICD-10-CM

## 2019-01-23 NOTE — Progress Notes (Signed)
Patient notified and voiced understanding  Stone analysis was mixed calcium oxalate

## 2019-01-24 ENCOUNTER — Ambulatory Visit
Admission: RE | Admit: 2019-01-24 | Discharge: 2019-01-24 | Disposition: A | Discharge status: Home / Self Care | Payer: Medicare HMO | Source: Ambulatory Visit | Attending: Urology | Admitting: Urology

## 2019-01-24 ENCOUNTER — Ambulatory Visit
Admission: RE | Admit: 2019-01-24 | Discharge: 2019-01-24 | Disposition: A | Discharge status: Home / Self Care | Payer: Medicare HMO | Attending: Urology | Admitting: Urology

## 2019-01-24 ENCOUNTER — Other Ambulatory Visit: Payer: Self-pay

## 2019-01-24 ENCOUNTER — Encounter: Payer: Self-pay | Admitting: Urology

## 2019-01-24 ENCOUNTER — Ambulatory Visit: Payer: Medicare HMO | Admitting: Urology

## 2019-01-24 VITALS — BP 174/108 | HR 61 | Ht 59.0 in | Wt 193.0 lb

## 2019-01-24 DIAGNOSIS — Z87442 Personal history of urinary calculi: Secondary | ICD-10-CM | POA: Diagnosis present

## 2019-01-25 NOTE — Progress Notes (Signed)
01/24/2019 6:45 AM   Jessica Cole [birth year] [id number]  Referring provider: Adin Hector, MD [facility] [place],  [place] [address]  Chief Complaint  Patient presents with  . Nephrolithiasis    Urologic history: 1.  Personal history urinary tract stones  -SWL left distal ureteral calculus 05/2017  -24 hour urine study low urine volume, hypercalciuria, hypocitraturia  -Stone analysis mixed calcium oxalate  HPI: Ms. Jessica Cole presents for annual follow-up.  Since her visit last year she states she has been doing well.  Denies flank, abdominal or pelvic pain.  No dysuria or gross hematuria.  She does have OAB symptoms and has been prescribed oxybutynin by Dr. Caryl Comes.   PMH: Past Medical History:  Diagnosis Date  . Anxiety   . Colon polyps   . Degenerative arthritis   . Depression   . Diabetes mellitus without complication ([place])    Type II  . Environmental allergies   . Family history of adverse reaction to anesthesia    sister- PONV  . Fatty liver   . Glaucoma   . Headache(784.0)    HX  MIGRAINES  . History of bronchitis   . History of kidney stones   . History of pneumonia   . Hypertension   . Hypothyroidism   . Obesity   . OSA on CPAP   . Plantar fasciitis    right  . PONV (postoperative nausea and vomiting)   . Renal calculi   . Renal calculi   . RLS (restless legs syndrome) 08/23/2014    Surgical History: Past Surgical History:  Procedure Laterality Date  . ABDOMINAL HYSTERECTOMY     partial  . APPENDECTOMY    . Arthroscopic surgery, knee Left   . BREAST BIOPSY Right    several  . BREAST BIOPSY Right 03/11/2017   u/s bx neg  . BUNIONECTOMY     LEFT   . COLONOSCOPY W/ POLYPECTOMY    . COLONOSCOPY WITH PROPOFOL N/A 08/14/2017   Procedure: COLONOSCOPY WITH PROPOFOL;  Surgeon: Lollie Sails, MD;  Location: [facility] ENDOSCOPY;  Service: Endoscopy;  Laterality: N/A;  . EXTRACORPOREAL SHOCK WAVE LITHOTRIPSY Left  05/14/2017   Procedure: EXTRACORPOREAL SHOCK WAVE LITHOTRIPSY (ESWL);  Surgeon: Abbie Sons, MD;  Location: [facility] ORS;  Service: Urology;  Laterality: Left;  . FOOT SURGERY     RIGHT    . KNEE ARTHROSCOPY W/ MENISCAL REPAIR Left   . LASIK    . MAXIMUM ACCESS (MAS)POSTERIOR LUMBAR INTERBODY FUSION (PLIF) 1 LEVEL N/A 04/25/2016   Procedure: LUMBAR FOUR-FIVE  MAXIMUM ACCESS (MAS) POSTERIOR LUMBAR INTERBODY FUSION (PLIF) with extension of instrumentation LUMBAR TWO-FIVE;  Surgeon: Eustace Moore, MD;  Location: [facility];  Service: Neurosurgery;  Laterality: N/A;  . MAXIMUM ACCESS (MAS)POSTERIOR LUMBAR INTERBODY FUSION (PLIF) 2 LEVEL N/A 12/13/2015   Procedure: Lumbar two-three - Lumbar three-four MAXIMUM ACCESS (MAS) POSTERIOR LUMBAR INTERBODY FUSION (PLIF)  ;  Surgeon: Eustace Moore, MD;  Location: [facility] NEURO ORS;  Service: Neurosurgery;  Laterality: N/A;  . SHOULDER SURGERY     RIGHT   . TONSILLECTOMY      Home Medications:  Allergies as of 01/24/2019      Reactions   Diazepam Other (See Comments)   hallucinations   Dexamethasone Other (See Comments)   During a tapered dose, once.  Was shaky (side effect, not allergy).      Medication List       Accurate as of January 24, 2019 11:59 PM. If you have any questions, ask your nurse or doctor.        STOP taking these medications   FreeStyle Libre 14 Day Sensor Misc Stopped by: Riki Altes, MD   FreeStyle Libre Reader Hardie Pulley Stopped by: Riki Altes, MD     TAKE these medications   albuterol 108 (90 Base) MCG/ACT inhaler Commonly known as: VENTOLIN HFA Inhale into the lungs.   Aller-Ease 180 MG tablet Generic drug: fexofenadine Take 180 mg by mouth daily.   ASPIRIN 81 PO Take by mouth.   azelastine 0.1 % nasal spray Commonly known as: ASTELIN Place 1 spray into both nostrils 2 (two) times daily.   Breo Ellipta 200-25 MCG/INH Aepb Generic drug: fluticasone furoate-vilanterol Take 1 puff daily by mouth.    diclofenac sodium 1 % Gel Commonly known as: VOLTAREN Apply 4 g topically 2 (two) times daily. What changed:   when to take this  reasons to take this   fluticasone 50 MCG/ACT nasal spray Commonly known as: FLONASE Place 2 sprays into both nostrils 2 (two) times daily.   gabapentin 800 MG tablet Commonly known as: NEURONTIN TAKE 1 TABLET BY MOUTH TWICE A DAY   latanoprost 0.005 % ophthalmic solution Commonly known as: XALATAN Place 1 drop into both eyes at bedtime.   levothyroxine 112 MCG tablet Commonly known as: SYNTHROID Take 112 mcg by mouth daily before breakfast.   losartan 50 MG tablet Commonly known as: COZAAR Take 50 mg by mouth every evening.   meloxicam 7.5 MG tablet Commonly known as: MOBIC Take 7.5 mg by mouth daily.   metFORMIN 500 MG tablet Commonly known as: GLUCOPHAGE Take 500 mg by mouth 2 (two) times daily with a meal.   metoprolol succinate 50 MG 24 hr tablet Commonly known as: Toprol XL Take 1 tablet (50 mg total) by mouth daily. Take with or immediately following a meal.   montelukast 10 MG tablet Commonly known as: SINGULAIR   ONE TOUCH ULTRA TEST test strip Generic drug: glucose blood USE 2 (TWO) TIMES DAILY USE AS INSTRUCTED.   oxybutynin 5 MG tablet Commonly known as: DITROPAN Take 5 mg by mouth 2 (two) times daily.   rOPINIRole 4 MG tablet Commonly known as: REQUIP TAKE 1 TABLET BY MOUTH TWICE A DAY   telmisartan 40 MG tablet Commonly known as: MICARDIS Take 40 mg by mouth daily.   traMADol 50 MG tablet Commonly known as: ULTRAM Take 1 tablet (50 mg total) by mouth every 6 (six) hours as needed.       Allergies:  Allergies  Allergen Reactions  . Diazepam Other (See Comments)    hallucinations  . Dexamethasone Other (See Comments)    During a tapered dose, once.  Was shaky (side effect, not allergy).    Family History: Family History  Problem Relation Age of Onset  . Lung cancer Mother   . Congestive Heart  Failure Father   . Depression Sister   . Rheum arthritis Sister   . Headache Maternal Grandfather   . Migraines Maternal Grandfather   . Headache Maternal Uncle   . Diabetes Other   . Heart disease Other   . Hypertension Other   . Breast cancer Neg Hx   . Bladder Cancer Neg Hx   . Kidney cancer Neg Hx     Social History:  reports that she has never smoked. She has never used smokeless tobacco. She reports that she does not drink alcohol or use  drugs.  ROS: UROLOGY Frequent Urination?: No Hard to postpone urination?: No Burning/pain with urination?: No Get up at night to urinate?: Yes Leakage of urine?: Yes Urine stream starts and stops?: No Trouble starting stream?: No Do you have to strain to urinate?: No Blood in urine?: No Urinary tract infection?: No Sexually transmitted disease?: No Injury to kidneys or bladder?: No Painful intercourse?: No Weak stream?: No Currently pregnant?: No Vaginal bleeding?: No Last menstrual period?: N/A  Gastrointestinal Nausea?: No Vomiting?: No Indigestion/heartburn?: No Diarrhea?: No Constipation?: Yes  Constitutional Fever: No Night sweats?: No Weight loss?: No Fatigue?: Yes  Skin Skin rash/lesions?: No Itching?: No  Eyes Blurred vision?: No Double vision?: No  Ears/Nose/Throat Sore throat?: No Sinus problems?: Yes  Hematologic/Lymphatic Swollen glands?: No Easy bruising?: Yes  Cardiovascular Leg swelling?: No Chest pain?: No  Respiratory Cough?: Yes Shortness of breath?: No  Endocrine Excessive thirst?: No  Musculoskeletal Back pain?: No Joint pain?: Yes  Neurological Headaches?: Yes Dizziness?: No  Psychologic Depression?: No Anxiety?: No  Physical Exam: BP (!) 174/108 (BP Location: Left Arm, Patient Position: Sitting, Cuff Size: Normal)   Pulse 61   Ht 4\' 11"  (1.499 m)   Wt 193 lb (87.5 kg)   BMI 38.98 kg/m   Constitutional:  Alert and oriented, No acute distress. HEENT: [place] AT, moist  mucus membranes.  Trachea midline, no masses. Cardiovascular: No clubbing, cyanosis, or edema. Respiratory: Normal respiratory effort, no increased work of breathing. Skin: No rashes, bruises or suspicious lesions. Neurologic: Grossly intact, no focal deficits, moving all 4 extremities. Psychiatric: Normal mood and affect.   Assessment & Plan:    - Personal history urinary calculi She did not have time to get a KUB today and will do in the next 4 weeks.  She will be notified with results.  If no evidence of recurrent calculi follow-up in 1 year.  Repeat 24-hour urine study on follow-up.  Return in about 1 year (around 01/24/2020) for Recheck, KUB.   Riki Altes, MD  [facility] [address] [place], [place] [address] [phone]

## 2019-01-26 ENCOUNTER — Telehealth: Payer: Self-pay | Admitting: Family Medicine

## 2019-01-26 NOTE — Telephone Encounter (Signed)
Patient notified and voiced understanding.

## 2019-01-26 NOTE — Telephone Encounter (Signed)
-----   Message from Abbie Sons, MD sent at 01/25/2019  5:56 PM EDT ----- KUB showed no calcifications suspicious for recurrent stones

## 2019-01-30 ENCOUNTER — Encounter: Payer: Self-pay | Admitting: Urology

## 2019-02-02 ENCOUNTER — Other Ambulatory Visit: Payer: Self-pay | Admitting: Internal Medicine

## 2019-02-02 DIAGNOSIS — Z1231 Encounter for screening mammogram for malignant neoplasm of breast: Secondary | ICD-10-CM

## 2019-03-09 ENCOUNTER — Other Ambulatory Visit: Payer: Self-pay | Admitting: Student

## 2019-03-09 DIAGNOSIS — M5416 Radiculopathy, lumbar region: Secondary | ICD-10-CM

## 2019-03-17 ENCOUNTER — Ambulatory Visit
Admission: RE | Admit: 2019-03-17 | Discharge: 2019-03-17 | Disposition: A | Discharge status: Home / Self Care | Payer: Medicare HMO | Source: Ambulatory Visit | Attending: Internal Medicine | Admitting: Internal Medicine

## 2019-03-17 DIAGNOSIS — Z1231 Encounter for screening mammogram for malignant neoplasm of breast: Secondary | ICD-10-CM | POA: Diagnosis present

## 2019-04-03 ENCOUNTER — Ambulatory Visit
Admission: RE | Admit: 2019-04-03 | Discharge: 2019-04-03 | Disposition: A | Discharge status: Home / Self Care | Payer: Medicare HMO | Source: Ambulatory Visit | Attending: Student | Admitting: Student

## 2019-04-03 ENCOUNTER — Other Ambulatory Visit: Payer: Self-pay

## 2019-04-03 DIAGNOSIS — M5416 Radiculopathy, lumbar region: Secondary | ICD-10-CM

## 2019-04-03 MED ORDER — GADOBENATE DIMEGLUMINE 529 MG/ML IV SOLN
18.0000 mL | Freq: Once | INTRAVENOUS | Status: AC | PRN
  Administered 2019-04-03: 18 mL via INTRAVENOUS

## 2019-04-22 ENCOUNTER — Other Ambulatory Visit: Payer: Self-pay | Admitting: Neurology

## 2019-05-08 NOTE — Progress Notes (Signed)
PATIENT: Jessica Cole DOB: 1951-02-17  REASON FOR VISIT: follow up HISTORY FROM: patient  HISTORY OF PRESENT ILLNESS: Today 05/09/19  Jessica Cole is a 69 year old female with history of diabetes, obesity, migraine headache, degenerative arthritis, and restless leg syndrome.  After her last visit, her Toprol dose was increased.  Aimovig has been effective, but was too expensive.  She is prescribed Ultram, Requip, and gabapentin from this office.  She reports leg pain, worse in her left leg, says she had a nerve conduction study last week with Dr. Murray Hodgkins.  She is pending the results.  She remains on Ultram, that she takes for leg pain, and headaches.  She says she tries to only take half a tablet, and usually takes near daily.  She has history of sciatic nerve issues, but reports this is eased down.  She continues to complain of headache, but they are less intense, and less frequent.  She says on average she may have 3-4 headaches a month.  Over the last month with her leg pain, she has had a few more headaches.  She denies any numbness, bowel or urinary incontinence.  She is no longer taking Topamax.  She has not had any falls, but reports her legs may feel weak and she may have a near fall.  She presents today for evaluation unaccompanied.  HISTORY 10/25/2018 Dr. Anne Hahn: Jessica Cole is a 69 year old right-handed white female with a history of diabetes, obesity, migraine headache, degenerative arthritis, and restless leg syndrome.  The patient had been on Topamax, she is not clear that she is on the medication currently.  She indicates that her severity of the migraine as lessened, but her frequency is increased, she may have 3-4 headaches a month, but the headaches may last all day long.  She takes Ultram as needed for the headache.  She takes ropinirole for her restless leg syndrome, she still does not sleep well because she is under stress she indicates.  She has a topical ointment that she has  purchased online that also helps her restless leg syndrome.  She returns to this office for an evaluation   REVIEW OF SYSTEMS: Out of a complete 14 system review of symptoms, the patient complains only of the following symptoms, and all other reviewed systems are negative.  Headaches, restless legs, leg pain   ALLERGIES: Allergies  Allergen Reactions  . Diazepam Other (See Comments)    hallucinations  . Dexamethasone Other (See Comments)    During a tapered dose, once.  Was shaky (side effect, not allergy).    HOME MEDICATIONS: Outpatient Medications Prior to Visit  Medication Sig Dispense Refill  . albuterol (PROVENTIL HFA;VENTOLIN HFA) 108 (90 Base) MCG/ACT inhaler Inhale into the lungs.    . ASPIRIN 81 PO Take by mouth.    Marland Kitchen azelastine (ASTELIN) 0.1 % nasal spray Place 1 spray into both nostrils 2 (two) times daily.     Marland Kitchen BREO ELLIPTA 200-25 MCG/INH AEPB Take 1 puff daily by mouth.    . diclofenac sodium (VOLTAREN) 1 % GEL Apply 4 g topically 2 (two) times daily. (Patient taking differently: Apply 4 g topically daily as needed (for pain). ) 100 g 1  . fluticasone (FLONASE) 50 MCG/ACT nasal spray Place 2 sprays into both nostrils 2 (two) times daily.     Marland Kitchen gabapentin (NEURONTIN) 800 MG tablet TAKE 1 TABLET BY MOUTH TWICE A DAY 180 tablet 2  . latanoprost (XALATAN) 0.005 % ophthalmic solution Place 1 drop  into both eyes at bedtime.     Marland Kitchen levothyroxine (SYNTHROID, LEVOTHROID) 112 MCG tablet Take 112 mcg by mouth daily before breakfast.     . losartan (COZAAR) 50 MG tablet Take 50 mg by mouth every evening.     . meloxicam (MOBIC) 7.5 MG tablet Take 7.5 mg by mouth daily.    . metFORMIN (GLUCOPHAGE) 500 MG tablet Take 500 mg by mouth 2 (two) times daily with a meal.     . metoprolol succinate (TOPROL XL) 50 MG 24 hr tablet Take 1 tablet (50 mg total) by mouth daily. Take with or immediately following a meal. 90 tablet 3  . ONE TOUCH ULTRA TEST test strip USE 2 (TWO) TIMES DAILY USE AS  INSTRUCTED.  3  . oxybutynin (DITROPAN) 5 MG tablet Take 5 mg by mouth 2 (two) times daily.    Marland Kitchen rOPINIRole (REQUIP) 4 MG tablet TAKE 1 TABLET BY MOUTH TWICE A DAY 180 tablet 0  . telmisartan (MICARDIS) 40 MG tablet Take 40 mg by mouth daily.    . traMADol (ULTRAM) 50 MG tablet Take 1 tablet (50 mg total) by mouth every 6 (six) hours as needed. 50 tablet 1  . UNABLE TO FIND 1 tablet 3 (three) times daily. Med Name: Neuriva-Plus. OTC    . UNABLE TO FIND 3 tablets. Med Name: [place]ie. Takes 2 in the morning and 1 at night    . fexofenadine (ALLER-EASE) 180 MG tablet Take 180 mg by mouth daily.    . montelukast (SINGULAIR) 10 MG tablet      No facility-administered medications prior to visit.    PAST MEDICAL HISTORY: Past Medical History:  Diagnosis Date  . Anxiety   . Colon polyps   . Degenerative arthritis   . Depression   . Diabetes mellitus without complication ([facility])    Type II  . Environmental allergies   . Family history of adverse reaction to anesthesia    sister- PONV  . Fatty liver   . Glaucoma   . Headache(784.0)    HX  MIGRAINES  . History of bronchitis   . History of kidney stones   . History of pneumonia   . Hypertension   . Hypothyroidism   . Obesity   . OSA on CPAP   . Plantar fasciitis    right  . PONV (postoperative nausea and vomiting)   . Renal calculi   . Renal calculi   . RLS (restless legs syndrome) 08/23/2014    PAST SURGICAL HISTORY: Past Surgical History:  Procedure Laterality Date  . ABDOMINAL HYSTERECTOMY     partial  . APPENDECTOMY    . Arthroscopic surgery, knee Left   . BREAST BIOPSY Right    several  . BREAST BIOPSY Right 03/11/2017   u/s bx neg  . BUNIONECTOMY     LEFT   . COLONOSCOPY W/ POLYPECTOMY    . COLONOSCOPY WITH PROPOFOL N/A 08/14/2017   Procedure: COLONOSCOPY WITH PROPOFOL;  Surgeon: Christena Deem, MD;  Location: [facility] ENDOSCOPY;  Service: Endoscopy;  Laterality: N/A;  . EXTRACORPOREAL SHOCK WAVE LITHOTRIPSY Left  05/14/2017   Procedure: EXTRACORPOREAL SHOCK WAVE LITHOTRIPSY (ESWL);  Surgeon: Riki Altes, MD;  Location: [facility] ORS;  Service: Urology;  Laterality: Left;  . FOOT SURGERY     RIGHT    . KNEE ARTHROSCOPY W/ MENISCAL REPAIR Left   . LASIK    . MAXIMUM ACCESS (MAS)POSTERIOR LUMBAR INTERBODY FUSION (PLIF) 1 LEVEL N/A 04/25/2016   Procedure:  LUMBAR FOUR-FIVE  MAXIMUM ACCESS (MAS) POSTERIOR LUMBAR INTERBODY FUSION (PLIF) with extension of instrumentation LUMBAR TWO-FIVE;  Surgeon: Eustace Moore, MD;  Location: [facility];  Service: Neurosurgery;  Laterality: N/A;  . MAXIMUM ACCESS (MAS)POSTERIOR LUMBAR INTERBODY FUSION (PLIF) 2 LEVEL N/A 12/13/2015   Procedure: Lumbar two-three - Lumbar three-four MAXIMUM ACCESS (MAS) POSTERIOR LUMBAR INTERBODY FUSION (PLIF)  ;  Surgeon: Eustace Moore, MD;  Location[facility] NEURO ORS;  Service: Neurosurgery;  Laterality: N/A;  . SHOULDER SURGERY     RIGHT   . TONSILLECTOMY      FAMILY HISTORY: Family History  Problem Relation Age of Onset  . Lung cancer Mother   . Congestive Heart Failure Father   . Depression Sister   . Rheum arthritis Sister   . Headache Maternal Grandfather   . Migraines Maternal Grandfather   . Headache Maternal Uncle   . Diabetes Other   . Heart disease Other   . Hypertension Other   . Breast cancer Neg Hx   . Bladder Cancer Neg Hx   . Kidney cancer Neg Hx     SOCIAL HISTORY: Social History   Socioeconomic History  . Marital status: Married    Spouse name: Georgina Quint   . Number of children: 0  . Years of education: 48  . Highest education level: Not on file  Occupational History  . Occupation: retired  Tobacco Use  . Smoking status: Never Smoker  . Smokeless tobacco: Never Used  Substance and Sexual Activity  . Alcohol use: No  . Drug use: No  . Sexual activity: Yes    Birth control/protection: None  Other Topics Concern  . Not on file  Social History Narrative   Patient is married and lives with her husband.   Patient  has a high school education.   Patient has no children.    Patient works for the [employer]   Patient is right handed.   Patient drinks occasionally drinks caffeine.   Social Determinants of Health   Financial Resource Strain:   . Difficulty of Paying Living Expenses: Not on file  Food Insecurity:   . Worried About Charity fundraiser in the Last Year: Not on file  . Ran Out of Food in the Last Year: Not on file  Transportation Needs:   . Lack of Transportation (Medical): Not on file  . Lack of Transportation (Non-Medical): Not on file  Physical Activity:   . Days of Exercise per Week: Not on file  . Minutes of Exercise per Session: Not on file  Stress:   . Feeling of Stress : Not on file  Social Connections:   . Frequency of Communication with Friends and Family: Not on file  . Frequency of Social Gatherings with Friends and Family: Not on file  . Attends Religious Services: Not on file  . Active Member of Clubs or Organizations: Not on file  . Attends Archivist Meetings: Not on file  . Marital Status: Not on file  Intimate Partner Violence:   . Fear of Current or Ex-Partner: Not on file  . Emotionally Abused: Not on file  . Physically Abused: Not on file  . Sexually Abused: Not on file   PHYSICAL EXAM  Vitals:   05/09/19 0828  BP: (!) 140/100  Pulse: 64  Temp: (!) 96.9 F (36.1 C)  Weight: 190 lb 9.6 oz (86.5 kg)  Height: 4\' 11"  (1.499 m)   Body mass index is 38.5 kg/m.  Generalized: Well developed, in no acute distress   Neurological examination  Mentation: Alert oriented to time, place, history taking. Follows all commands speech and language fluent Cranial nerve II-XII: Pupils were equal round reactive to light. Extraocular movements were full, visual field were full on confrontational test. Facial sensation and strength were normal. Head turning and shoulder shrug  were normal and symmetric. Motor: The motor testing reveals 5 over 5  strength of all 4 extremities. Good symmetric motor tone is noted throughout.  Sensory: Sensory testing is intact to soft touch on all 4 extremities. No evidence of extinction is noted.  Coordination: Cerebellar testing reveals good finger-nose-finger and heel-to-shin bilaterally.  Gait and station: Slow to rise from seated position, takes a few minutes to get going, limping type gait on the left/antalgic, tandem gait is unsteady.  Romberg is negative. No drift is seen.  Reflexes: Deep tendon reflexes are symmetric   DIAGNOSTIC DATA (LABS, IMAGING, TESTING) - I reviewed patient records, labs, notes, testing and imaging myself where available.  Lab Results  Component Value Date   WBC 9.6 05/09/2017   HGB 14.0 05/09/2017   HCT 41.2 05/09/2017   MCV 92.1 05/09/2017   PLT 176 05/09/2017      Component Value Date/Time   NA 137 05/09/2017 1147   NA 138 06/08/2013 1558   K 4.0 05/09/2017 1147   K 4.1 06/08/2013 1558   CL 108 05/09/2017 1147   CL 106 06/08/2013 1558   CO2 23 05/09/2017 1147   CO2 29 06/08/2013 1558   GLUCOSE 199 (H) 05/09/2017 1147   GLUCOSE 93 06/08/2013 1558   BUN 15 05/09/2017 1147   BUN 11 06/08/2013 1558   CREATININE 1.14 (H) 05/09/2017 1147   CREATININE 0.64 06/08/2013 1558   CALCIUM 9.5 05/09/2017 1147   CALCIUM 9.6 06/08/2013 1558   PROT 7.4 05/09/2017 1147   PROT 7.5 08/01/2011 1307   ALBUMIN 4.4 05/09/2017 1147   ALBUMIN 4.3 08/01/2011 1307   AST 31 05/09/2017 1147   AST 47 (H) 08/01/2011 1307   ALT 35 05/09/2017 1147   ALT 69 08/01/2011 1307   ALKPHOS 55 05/09/2017 1147   ALKPHOS 69 08/01/2011 1307   BILITOT 0.6 05/09/2017 1147   BILITOT 0.4 08/01/2011 1307   GFRNONAA 48 (L) 05/09/2017 1147   GFRNONAA >60 06/08/2013 1558   GFRAA 56 (L) 05/09/2017 1147   GFRAA >60 06/08/2013 1558   Lab Results  Component Value Date   CHOL 225 (H) 08/02/2011   HDL 32 (L) 08/02/2011   LDLCALC 159 (H) 08/02/2011   TRIG 171 08/02/2011   Lab Results  Component  Value Date   HGBA1C 6.6 (H) 04/18/2016   No results found for: VITAMINB12 No results found for: TSH  ASSESSMENT AND PLAN 69 y.o. year old female  has a past medical history of Anxiety, Colon polyps, Degenerative arthritis, Depression, Diabetes mellitus without complication (H[facility]), Environmental allergies, Family history of adverse reaction to anesthesia, Fatty liver, Glaucoma, Headache(784.0), History of bronchitis, History of kidney stones, History of pneumonia, Hypertension, Hypothyroidism, Obesity, OSA on CPAP, Plantar fasciitis, PONV (postoperative nausea and vomiting), Renal calculi, Renal calculi, and RLS (restless legs syndrome) (08/23/2014). here with:  1.  Intractable migraine 2.  Restless leg syndrome 3.  Degenerative arthritis, left knee arthritis  She has undergone recent nerve conduction study with Dr. Murray HodgkinsBartko for leg pain, and is pending results.  She had recent MRI of her lumbar spine in November 2020, that was unchanged from prior MRI, negative  for central canal or foraminal stenosis, with mild degenerative disease, status post L2-5 fusion without evidence of complication.  She will remain on Toprol, Requip, and gabapentin.  She has asked that we continue to fill her Ultram for headache and leg/back pain (which we have done in the past, she has current bottle from the Dr. Yves Dill).  For now, her headaches are stable, she was unable to afford Aimovig previously.  She will remain on current medications.  She will follow-up in 6 months or sooner if needed. Refills were sent for Requip, none needed on gabapentin or Ultram at this time.  I spent 15 minutes with the patient. 50% of this time was spent discussing her plan of care.   Margie Ege, AGNP-C, DNP 05/09/2019, 8:42 AM [facility] [address] [place], [place] [address] [phone]

## 2019-05-09 ENCOUNTER — Other Ambulatory Visit: Payer: Self-pay

## 2019-05-09 ENCOUNTER — Encounter: Payer: Self-pay | Admitting: Neurology

## 2019-05-09 ENCOUNTER — Ambulatory Visit: Payer: Medicare HMO | Admitting: Neurology

## 2019-05-09 VITALS — BP 140/100 | HR 64 | Temp 96.9°F | Ht 59.0 in | Wt 190.6 lb

## 2019-05-09 DIAGNOSIS — G2581 Restless legs syndrome: Secondary | ICD-10-CM

## 2019-05-09 DIAGNOSIS — G43919 Migraine, unspecified, intractable, without status migrainosus: Secondary | ICD-10-CM | POA: Diagnosis not present

## 2019-05-09 MED ORDER — ROPINIROLE HCL 4 MG PO TABS: 4.0000 mg | ORAL_TABLET | Freq: Two times a day (BID) | ORAL | 1 refills | Status: DC

## 2019-05-09 NOTE — Patient Instructions (Signed)
Continue current medications, I have sent in a refill for Requip. We will see you in 6 months or sooner if needed.

## 2019-05-10 NOTE — Progress Notes (Signed)
I have read the note, and I agree with the clinical assessment and plan.  Kimothy Kishimoto K Leeah Politano   

## 2019-05-18 ENCOUNTER — Other Ambulatory Visit: Payer: Self-pay | Admitting: Neurology

## 2019-06-11 ENCOUNTER — Ambulatory Visit: Payer: Medicare HMO | Attending: Internal Medicine

## 2019-06-11 DIAGNOSIS — Z23 Encounter for immunization: Secondary | ICD-10-CM | POA: Insufficient documentation

## 2019-06-11 NOTE — Progress Notes (Signed)
   Covid-19 Vaccination Clinic  Name:  Jessica Cole    MRN: [id number] DOB: [birth year]  06/11/2019  [name] was observed post Covid-19 immunization for 15 minutes without incidence. She was provided with Vaccine Information Sheet and instruction to access the V-Safe system.   [name] was instructed to call 911 with any severe reactions post vaccine: Marland Kitchen Difficulty breathing  . Swelling of your face and throat  . A fast heartbeat  . A bad rash all over your body  . Dizziness and weakness    Immunizations Administered    Name Date Dose VIS Date Route   Pfizer COVID-19 Vaccine 06/11/2019 12:15 PM 0.3 mL 04/15/2019 Intramuscular   Manufacturer: [employer], [employer]   Lot: [id number]   NDC: [address]-4

## 2019-07-06 ENCOUNTER — Ambulatory Visit: Payer: Medicare HMO | Attending: Internal Medicine

## 2019-07-06 DIAGNOSIS — Z23 Encounter for immunization: Secondary | ICD-10-CM

## 2019-07-06 NOTE — Progress Notes (Signed)
   Covid-19 Vaccination Clinic  Name:  Jessica Cole    MRN: [id number] DOB: 24-Apr-1951  07/06/2019  Jessica Cole was observed post Covid-19 immunization for 15 minutes without incident. She was provided with Vaccine Information Sheet and instruction to access the V-Safe system.   Jessica Cole was instructed to call 911 with any severe reactions post vaccine: Marland Kitchen Difficulty breathing  . Swelling of face and throat  . A fast heartbeat  . A bad rash all over body  . Dizziness and weakness   Immunizations Administered    Name Date Dose VIS Date Route   Pfizer COVID-19 Vaccine 07/06/2019  8:37 AM 0.3 mL 04/15/2019 Intramuscular   Manufacturer: [employer], [employer]   Lot: [id number]   NDC: [address]-6

## 2019-07-14 ENCOUNTER — Other Ambulatory Visit: Payer: Self-pay | Admitting: Student

## 2019-07-14 ENCOUNTER — Telehealth: Payer: Self-pay | Admitting: Nurse Practitioner

## 2019-07-14 DIAGNOSIS — M25551 Pain in right hip: Secondary | ICD-10-CM

## 2019-07-14 NOTE — Telephone Encounter (Signed)
Phone call to patient to verify medication list and allergies for myelogram procedure. Pt instructed to hold tramadol and duloxetine for 48hrs prior to myelogram appointment time. Pt verbalized understanding. Pre and post procedure instructions reviewed with pt.

## 2019-07-19 ENCOUNTER — Other Ambulatory Visit: Payer: Self-pay

## 2019-07-19 ENCOUNTER — Ambulatory Visit
Admission: RE | Admit: 2019-07-19 | Discharge: 2019-07-19 | Disposition: A | Discharge status: Home / Self Care | Payer: Medicare HMO | Source: Ambulatory Visit | Attending: Student | Admitting: Student

## 2019-07-19 DIAGNOSIS — M25552 Pain in left hip: Secondary | ICD-10-CM

## 2019-07-19 DIAGNOSIS — M25551 Pain in right hip: Secondary | ICD-10-CM

## 2019-07-19 MED ORDER — IOPAMIDOL (ISOVUE-M 200) INJECTION 41%
18.0000 mL | Freq: Once | INTRAMUSCULAR | Status: AC
  Administered 2019-07-19: 18 mL via INTRATHECAL

## 2019-07-19 NOTE — Discharge Instructions (Signed)
Myelogram Discharge Instructions  1. Go home and rest quietly for the next 24 hours.  It is important to lie flat for the next 24 hours.  Get up only to go to the restroom.  You may lie in the bed or on a couch on your back, your stomach, your left side or your right side.  You may have one pillow under your head.  You may have pillows between your knees while you are on your side or under your knees while you are on your back.  2. DO NOT drive today.  Recline the seat as far back as it will go, while still wearing your seat belt, on the way home.  3. You may get up to go to the bathroom as needed.  You may sit up for 10 minutes to eat.  You may resume your normal diet and medications unless otherwise indicated.  Drink lots of extra fluids today and tomorrow.  4. The incidence of headache, nausea, or vomiting is about 5% (one in 20 patients).  If you develop a headache, lie flat and drink plenty of fluids until the headache goes away.  Caffeinated beverages may be helpful.  If you develop severe nausea and vomiting or a headache that does not go away with flat bed rest, call [phone].  5. You may resume normal activities after your 24 hours of bed rest is over; however, do not exert yourself strongly or do any heavy lifting tomorrow. If when you get up you have a headache when standing, go back to bed and force fluids for another 24 hours.  6. Call your physician for a follow-up appointment.  The results of your myelogram will be sent directly to your physician by the following day.  7. If you have any questions or if complications develop after you arrive home, please call [phone].  Discharge instructions have been explained to the patient.  The patient, or the person responsible for the patient, fully understands these instructions.  YOU MAY RESTART YOUR TRAMADOL AND DULOXETINE TOMORROW 07/20/19 AT 1:00PM.

## 2019-09-07 ENCOUNTER — Encounter: Payer: Medicare HMO | Admitting: Physical Therapy

## 2019-09-13 ENCOUNTER — Ambulatory Visit: Payer: Medicare HMO

## 2019-09-15 ENCOUNTER — Ambulatory Visit: Payer: Medicare HMO

## 2019-09-28 ENCOUNTER — Other Ambulatory Visit: Payer: Self-pay | Admitting: Adult Health

## 2019-10-21 ENCOUNTER — Other Ambulatory Visit: Payer: Self-pay | Admitting: Neurology

## 2019-11-09 ENCOUNTER — Ambulatory Visit: Payer: Medicare HMO | Admitting: Neurology

## 2019-11-16 ENCOUNTER — Telehealth: Payer: Self-pay | Admitting: Neurology

## 2019-11-16 ENCOUNTER — Ambulatory Visit: Payer: Medicare HMO | Admitting: Neurology

## 2019-11-16 NOTE — Telephone Encounter (Signed)
Noted  

## 2019-11-16 NOTE — Progress Notes (Deleted)
PATIENT: Jessica Cole DOB: [birth year]  REASON FOR VISIT: follow up HISTORY FROM: patient  HISTORY OF PRESENT ILLNESS: Today 11/16/19  Jessica Cole is a 69 year old female with history of diabetes, obesity, migraine headache, degenerative arthritis, and restless leg syndrome.  She is on Toprol, Aimovig was effective, but was too expensive, is on Ultram, Requip, and gabapentin from this office.  HISTORY 05/09/2019 SS: Jessica Cole is a 68 year old female with history of diabetes, obesity, migraine headache, degenerative arthritis, and restless leg syndrome.  After her last visit, her Toprol dose was increased.  Aimovig has been effective, but was too expensive.  She is prescribed Ultram, Requip, and gabapentin from this office.  She reports leg pain, worse in her left leg, says she had a nerve conduction study last week with Dr. Murray Hodgkins.  She is pending the results.  She remains on Ultram, that she takes for leg pain, and headaches.  She says she tries to only take half a tablet, and usually takes near daily.  She has history of sciatic nerve issues, but reports this is eased down.  She continues to complain of headache, but they are less intense, and less frequent.  She says on average she may have 3-4 headaches a month.  Over the last month with her leg pain, she has had a few more headaches.  She denies any numbness, bowel or urinary incontinence.  She is no longer taking Topamax.  She has not had any falls, but reports her legs may feel weak and she may have a near fall.  She presents today for evaluation unaccompanied.   REVIEW OF SYSTEMS: Out of a complete 14 system review of symptoms, the patient complains only of the following symptoms, and all other reviewed systems are negative.  ALLERGIES: Allergies  Allergen Reactions  . Diazepam Other (See Comments)    hallucinations  . Dexamethasone Other (See Comments)    During a tapered dose, once.  Was shaky (side effect, not allergy).     HOME MEDICATIONS: Outpatient Medications Prior to Visit  Medication Sig Dispense Refill  . albuterol (PROVENTIL HFA;VENTOLIN HFA) 108 (90 Base) MCG/ACT inhaler Inhale into the lungs.    Marland Kitchen Apoaequorin (PREVAGEN PO) Take by mouth.    . ASPIRIN 81 PO Take by mouth.    Marland Kitchen azelastine (ASTELIN) 0.1 % nasal spray Place 1 spray into both nostrils 2 (two) times daily.     Marland Kitchen BREO ELLIPTA 200-25 MCG/INH AEPB Take 1 puff daily by mouth.    . diclofenac sodium (VOLTAREN) 1 % GEL Apply 4 g topically 2 (two) times daily. (Patient taking differently: Apply 4 g topically daily as needed (for pain). ) 100 g 1  . DULoxetine (CYMBALTA) 60 MG capsule Take 30 mg by mouth daily.    . fexofenadine (ALLER-EASE) 180 MG tablet Take 180 mg by mouth daily.    . fluticasone (FLONASE) 50 MCG/ACT nasal spray Place 2 sprays into both nostrils 2 (two) times daily.     Marland Kitchen gabapentin (NEURONTIN) 800 MG tablet TAKE 1 TABLET BY MOUTH TWICE A DAY 180 tablet 2  . latanoprost (XALATAN) 0.005 % ophthalmic solution Place 1 drop into both eyes at bedtime.     Marland Kitchen levothyroxine (SYNTHROID, LEVOTHROID) 112 MCG tablet Take 112 mcg by mouth daily before breakfast.     . losartan (COZAAR) 50 MG tablet Take 50 mg by mouth every evening.     . meloxicam (MOBIC) 7.5 MG tablet Take 7.5 mg by mouth daily.    Marland Kitchen  metFORMIN (GLUCOPHAGE) 500 MG tablet Take 500 mg by mouth 2 (two) times daily with a meal.     . metoprolol succinate (TOPROL-XL) 50 MG 24 hr tablet TAKE 1 TABLET (50 MG TOTAL) BY MOUTH DAILY. TAKE WITH OR IMMEDIATELY FOLLOWING A MEAL. 90 tablet 0  . montelukast (SINGULAIR) 10 MG tablet     . ONE TOUCH ULTRA TEST test strip USE 2 (TWO) TIMES DAILY USE AS INSTRUCTED.  3  . oxybutynin (DITROPAN) 5 MG tablet Take 5 mg by mouth 2 (two) times daily.    . pravastatin (PRAVACHOL) 10 MG tablet Take 10 mg by mouth daily.    Marland Kitchen rOPINIRole (REQUIP) 4 MG tablet Take 1 tablet (4 mg total) by mouth 2 (two) times daily. 180 tablet 1  . telmisartan  (MICARDIS) 40 MG tablet Take 40 mg by mouth daily.    Marland Kitchen topiramate (TOPAMAX) 100 MG tablet Take 100 mg by mouth 2 (two) times daily.    . traMADol (ULTRAM) 50 MG tablet TAKE 1/2-1 TABLET BY MOUTH 2 TIMES A DAY AS NEEDED 60 tablet 1  . UNABLE TO FIND 1 tablet 3 (three) times daily. Med Name: Neuriva-Plus. OTC    . UNABLE TO FIND 3 tablets. Med Name: Hair la Vie. Takes 2 in the morning and 1 at night     No facility-administered medications prior to visit.    PAST MEDICAL HISTORY: Past Medical History:  Diagnosis Date  . Anxiety   . Colon polyps   . Degenerative arthritis   . Depression   . Diabetes mellitus without complication (HCC)    Type II  . Environmental allergies   . Family history of adverse reaction to anesthesia    sister- PONV  . Fatty liver   . Glaucoma   . Headache(784.0)    HX  MIGRAINES  . History of bronchitis   . History of kidney stones   . History of pneumonia   . Hypertension   . Hypothyroidism   . Obesity   . OSA on CPAP   . Plantar fasciitis    right  . PONV (postoperative nausea and vomiting)   . Renal calculi   . Renal calculi   . RLS (restless legs syndrome) 08/23/2014    PAST SURGICAL HISTORY: Past Surgical History:  Procedure Laterality Date  . ABDOMINAL HYSTERECTOMY     partial  . APPENDECTOMY    . Arthroscopic surgery, knee Left   . BREAST BIOPSY Right    several  . BREAST BIOPSY Right 03/11/2017   u/s bx neg  . BUNIONECTOMY     LEFT   . COLONOSCOPY W/ POLYPECTOMY    . COLONOSCOPY WITH PROPOFOL N/A 08/14/2017   Procedure: COLONOSCOPY WITH PROPOFOL;  Surgeon: Christena Deem, MD;  Location: [facility] ENDOSCOPY;  Service: Endoscopy;  Laterality: N/A;  . EXTRACORPOREAL SHOCK WAVE LITHOTRIPSY Left 05/14/2017   Procedure: EXTRACORPOREAL SHOCK WAVE LITHOTRIPSY (ESWL);  Surgeon: Riki Altes, MD;  Location: [facility] ORS;  Service: Urology;  Laterality: Left;  . FOOT SURGERY     RIGHT    . KNEE ARTHROSCOPY W/ MENISCAL REPAIR Left   . LASIK     . MAXIMUM ACCESS (MAS)POSTERIOR LUMBAR INTERBODY FUSION (PLIF) 1 LEVEL N/A 04/25/2016   Procedure: LUMBAR FOUR-FIVE  MAXIMUM ACCESS (MAS) POSTERIOR LUMBAR INTERBODY FUSION (PLIF) with extension of instrumentation LUMBAR TWO-FIVE;  Surgeon: Tia Alert, MD;  [facility] OR;  Service: Neurosurgery;  Laterality: N/A;  . MAXIMUM ACCESS (MAS)POSTERIOR LUMBAR INTERBODY FUSION (PLIF) 2  LEVEL N/A 12/13/2015   Procedure: Lumbar two-three - Lumbar three-four MAXIMUM ACCESS (MAS) POSTERIOR LUMBAR INTERBODY FUSION (PLIF)  ;  Surgeon: Tia Alert, MD;  Location: [facility] NEURO ORS;  Service: Neurosurgery;  Laterality: N/A;  . SHOULDER SURGERY     RIGHT   . TONSILLECTOMY      FAMILY HISTORY: Family History  Problem Relation Age of Onset  . Lung cancer Mother   . Congestive Heart Failure Father   . Depression Sister   . Rheum arthritis Sister   . Headache Maternal Grandfather   . Migraines Maternal Grandfather   . Headache Maternal Uncle   . Diabetes Other   . Heart disease Other   . Hypertension Other   . Breast cancer Neg Hx   . Bladder Cancer Neg Hx   . Kidney cancer Neg Hx     SOCIAL HISTORY: Social History   Socioeconomic History  . Marital status: Married    Spouse name: Milinda Cave   . Number of children: 0  . Years of education: 7  . Highest education level: Not on file  Occupational History  . Occupation: retired  Tobacco Use  . Smoking status: Never Smoker  . Smokeless tobacco: Never Used  Vaping Use  . Vaping Use: Never used  Substance and Sexual Activity  . Alcohol use: No  . Drug use: No  . Sexual activity: Yes    Birth control/protection: None  Other Topics Concern  . Not on file  Social History Narrative   Patient is married and lives with her husband.   Patient has a high school education.   Patient has no children.    Patient works for the [employer]   Patient is right handed.   Patient drinks occasionally drinks caffeine.   Social Determinants of  Health   Financial Resource Strain:   . Difficulty of Paying Living Expenses:   Food Insecurity:   . Worried About Programme researcher, broadcasting/film/video in the Last Year:   . Barista in the Last Year:   Transportation Needs:   . Freight forwarder (Medical):   Marland Kitchen Lack of Transportation (Non-Medical):   Physical Activity:   . Days of Exercise per Week:   . Minutes of Exercise per Session:   Stress:   . Feeling of Stress :   Social Connections:   . Frequency of Communication with Friends and Family:   . Frequency of Social Gatherings with Friends and Family:   . Attends Religious Services:   . Active Member of Clubs or Organizations:   . Attends Banker Meetings:   Marland Kitchen Marital Status:   Intimate Partner Violence:   . Fear of Current or Ex-Partner:   . Emotionally Abused:   Marland Kitchen Physically Abused:   . Sexually Abused:       PHYSICAL EXAM  There were no vitals filed for this visit. There is no height or weight on file to calculate BMI.  Generalized: Well developed, in no acute distress   Neurological examination  Mentation: Alert oriented to time, place, history taking. Follows all commands speech and language fluent Cranial nerve II-XII: Pupils were equal round reactive to light. Extraocular movements were full, visual field were full on confrontational test. Facial sensation and strength were normal. Uvula tongue midline. Head turning and shoulder shrug  were normal and symmetric. Motor: The motor testing reveals 5 over 5 strength of all 4 extremities. Good symmetric motor tone is noted throughout.  Sensory:  Sensory testing is intact to soft touch on all 4 extremities. No evidence of extinction is noted.  Coordination: Cerebellar testing reveals good finger-nose-finger and heel-to-shin bilaterally.  Gait and station: Gait is normal. Tandem gait is normal. Romberg is negative. No drift is seen.  Reflexes: Deep tendon reflexes are symmetric and normal bilaterally.    DIAGNOSTIC DATA (LABS, IMAGING, TESTING) - I reviewed patient records, labs, notes, testing and imaging myself where available.  Lab Results  Component Value Date   WBC 9.6 05/09/2017   HGB 14.0 05/09/2017   HCT 41.2 05/09/2017   MCV 92.1 05/09/2017   PLT 176 05/09/2017      Component Value Date/Time   NA 137 05/09/2017 1147   NA 138 06/08/2013 1558   K 4.0 05/09/2017 1147   K 4.1 06/08/2013 1558   CL 108 05/09/2017 1147   CL 106 06/08/2013 1558   CO2 23 05/09/2017 1147   CO2 29 06/08/2013 1558   GLUCOSE 199 (H) 05/09/2017 1147   GLUCOSE 93 06/08/2013 1558   BUN 15 05/09/2017 1147   BUN 11 06/08/2013 1558   CREATININE 1.14 (H) 05/09/2017 1147   CREATININE 0.64 06/08/2013 1558   CALCIUM 9.5 05/09/2017 1147   CALCIUM 9.6 06/08/2013 1558   PROT 7.4 05/09/2017 1147   PROT 7.5 08/01/2011 1307   ALBUMIN 4.4 05/09/2017 1147   ALBUMIN 4.3 08/01/2011 1307   AST 31 05/09/2017 1147   AST 47 (H) 08/01/2011 1307   ALT 35 05/09/2017 1147   ALT 69 08/01/2011 1307   ALKPHOS 55 05/09/2017 1147   ALKPHOS 69 08/01/2011 1307   BILITOT 0.6 05/09/2017 1147   BILITOT 0.4 08/01/2011 1307   GFRNONAA 48 (L) 05/09/2017 1147   GFRNONAA >60 06/08/2013 1558   GFRAA 56 (L) 05/09/2017 1147   GFRAA >60 06/08/2013 1558   Lab Results  Component Value Date   CHOL 225 (H) 08/02/2011   HDL 32 (L) 08/02/2011   LDLCALC 159 (H) 08/02/2011   TRIG 171 08/02/2011   Lab Results  Component Value Date   HGBA1C 6.6 (H) 04/18/2016   No results found for: VITAMINB12 No results found for: TSH    ASSESSMENT AND PLAN 69 y.o. year old female  has a past medical history of Anxiety, Colon polyps, Degenerative arthritis, Depression, Diabetes mellitus without complication (HCC), Environmental allergies, Family history of adverse reaction to anesthesia, Fatty liver, Glaucoma, Headache(784.0), History of bronchitis, History of kidney stones, History of pneumonia, Hypertension, Hypothyroidism, Obesity, OSA on  CPAP, Plantar fasciitis, PONV (postoperative nausea and vomiting), Renal calculi, Renal calculi, and RLS (restless legs syndrome) (08/23/2014). here with ***   I spent 15 minutes with the patient. 50% of this time was spent   Margie EgeSarah Michaelia Beilfuss, [place] Park[place]NP-C, DNP 11/16/2019, 5:29 AM [facility] [address], [address] [place], [place] [address] [phone]

## 2019-11-16 NOTE — Telephone Encounter (Signed)
Patient had an appointment today with Sarah at 8:45. Patient called stating she is feeling very sick and will not make it. She states she will call back to reschedule. FYI

## 2019-11-21 ENCOUNTER — Ambulatory Visit: Payer: Medicare HMO

## 2019-11-24 ENCOUNTER — Ambulatory Visit: Payer: Medicare HMO

## 2019-12-12 ENCOUNTER — Other Ambulatory Visit: Payer: Self-pay | Admitting: Surgery

## 2019-12-12 DIAGNOSIS — M7582 Other shoulder lesions, left shoulder: Secondary | ICD-10-CM

## 2019-12-12 DIAGNOSIS — M19012 Primary osteoarthritis, left shoulder: Secondary | ICD-10-CM

## 2019-12-23 ENCOUNTER — Other Ambulatory Visit: Payer: Self-pay

## 2019-12-23 ENCOUNTER — Ambulatory Visit
Admission: RE | Admit: 2019-12-23 | Discharge: 2019-12-23 | Disposition: A | Discharge status: Home / Self Care | Payer: Medicare HMO | Source: Ambulatory Visit | Attending: Surgery | Admitting: Surgery

## 2019-12-23 DIAGNOSIS — M19012 Primary osteoarthritis, left shoulder: Secondary | ICD-10-CM | POA: Insufficient documentation

## 2019-12-23 DIAGNOSIS — M7582 Other shoulder lesions, left shoulder: Secondary | ICD-10-CM

## 2020-01-11 ENCOUNTER — Ambulatory Visit: Payer: Medicare HMO | Admitting: Podiatry

## 2020-01-11 ENCOUNTER — Other Ambulatory Visit: Payer: Self-pay

## 2020-01-11 ENCOUNTER — Encounter: Payer: Self-pay | Admitting: Podiatry

## 2020-01-11 DIAGNOSIS — M722 Plantar fascial fibromatosis: Secondary | ICD-10-CM | POA: Diagnosis not present

## 2020-01-11 DIAGNOSIS — E119 Type 2 diabetes mellitus without complications: Secondary | ICD-10-CM

## 2020-01-11 NOTE — Progress Notes (Signed)
She presents today for follow-up of her diabetes and for diabetic foot care she is also complaining of bilateral heel pain.  Objective: Vital signs are stable she is alert and oriented x3 pulses are palpable.  Neurologic sensorium is intact but slightly diminished per Semmes Weinstein monofilament.  Deep tendon reflexes are normal as well as muscle strength.  Toenails are on normal.  No open lesions or wounds.  She has pain on palpation medial calcaneal tubercles bilateral.  Assessment: Pain in limb secondary to plantar fasciitis.  Otherwise no significant abnormalities due to diabetic foot.  Plan: Diabetic foot exam today.  I also injected the bilateral heels 20 mg Kenalog 5 mg Marcaine point maximal tenderness bilateral.  Tolerated procedure well follow-up with her as needed.

## 2020-01-13 ENCOUNTER — Telehealth: Payer: Self-pay

## 2020-01-13 NOTE — Telephone Encounter (Signed)
Pt called and has a question about her boot. Please advise

## 2020-01-13 NOTE — Telephone Encounter (Signed)
Spoke with patient, she wanted to know if she could wear a short boot that she had at home instead of the tall boot.  I instructed her that that was fine, but don't drive with boot on.  She verbalized instructions.

## 2020-01-18 ENCOUNTER — Other Ambulatory Visit: Payer: Self-pay | Admitting: Neurology

## 2020-01-23 ENCOUNTER — Other Ambulatory Visit: Payer: Self-pay | Admitting: Radiology

## 2020-01-23 DIAGNOSIS — N200 Calculus of kidney: Secondary | ICD-10-CM

## 2020-01-25 NOTE — Progress Notes (Signed)
01/26/2020 10:12 AM   Jessica Cole 06-Oct-1950 [id number]  Referring provider: Lynnea Ferrier, MD [address] [facility] [place],  [place] [address] Chief Complaint  Patient presents with  . Nephrolithiasis    1year follow up   Urologic history: 1.  Personal history urinary tract stones             -SWL left distal ureteral calculus 05/2017             -24 hour urine study low urine volume, hypercalciuria, hypocitraturia             -Stone analysis mixed calcium oxalate  2.  Overactive bladder   HPI: Jessica Cole is a 69 y.o. female with a personal history of urinary calculi who returns for a 1 year follow up.   -She reports no problems since last year. -No recent flank, abdominal or pelvic pain -Patient reports two stone episodes since last visit. She brought her last stone to the   -Denies dysuria, gross hematuria --She remains on oxybutynin per with stable frequency, urgency, urge incontinence  PMH: Past Medical History:  Diagnosis Date  . Anxiety   . Colon polyps   . Degenerative arthritis   . Depression   . Diabetes mellitus without complication (HCC)    Type II  . Environmental allergies   . Family history of adverse reaction to anesthesia    sister- PONV  . Fatty liver   . Glaucoma   . Headache(784.0)    HX  MIGRAINES  . History of bronchitis   . History of kidney stones   . History of pneumonia   . Hypertension   . Hypothyroidism   . Obesity   . OSA on CPAP   . Plantar fasciitis    right  . PONV (postoperative nausea and vomiting)   . Renal calculi   . Renal calculi   . RLS (restless legs syndrome) 08/23/2014    Surgical History: Past Surgical History:  Procedure Laterality Date  . ABDOMINAL HYSTERECTOMY     partial  . APPENDECTOMY    . Arthroscopic surgery, knee Left   . BREAST BIOPSY Right    several  . BREAST BIOPSY Right 03/11/2017   u/s bx neg  . BUNIONECTOMY     LEFT   . COLONOSCOPY W/ POLYPECTOMY    .  COLONOSCOPY WITH PROPOFOL N/A 08/14/2017   Procedure: COLONOSCOPY WITH PROPOFOL;  Surgeon: Christena Deem, MD;  Location: [facility]COPY;  Service: Endoscopy;  Laterality: N/A;  . EXTRACORPOREAL SHOCK WAVE LITHOTRIPSY Left 05/14/2017   Procedure: EXTRACORPOREAL SHOCK WAVE LITHOTRIPSY (ESWL);  Surgeon: Riki Altes, MD;  Location: [facility] ORS;  Service: Urology;  Laterality: Left;  . FOOT SURGERY     RIGHT    . KNEE ARTHROSCOPY W/ MENISCAL REPAIR Left   . LASIK    . MAXIMUM ACCESS (MAS)POSTERIOR LUMBAR INTERBODY FUSION (PLIF) 1 LEVEL N/A 04/25/2016   Procedure: LUMBAR FOUR-FIVE  MAXIMUM ACCESS (MAS) POSTERIOR LUMBAR INTERBODY FUSION (PLIF) with extension of instrumentation LUMBAR TWO-FIVE;  Surgeon: Tia Alert, MD;  Location: [facility] OR;  Service: Neurosurgery;  Laterality: N/A;  . MAXIMUM ACCESS (MAS)POSTERIOR LUMBAR INTERBODY FUSION (PLIF) 2 LEVEL N/A 12/13/2015   Procedure: Lumbar two-three - Lumbar three-four MAXIMUM ACCESS (MAS) POSTERIOR LUMBAR INTERBODY FUSION (PLIF)  ;  Surgeon: Tia Alert, MD;  Location: [facility] NEURO ORS;  Service: Neurosurgery;  Laterality: N/A;  . SHOULDER SURGERY     RIGHT   .  TONSILLECTOMY      Home Medications:  Allergies as of 01/26/2020      Reactions   Diazepam Other (See Comments)   hallucinations   Dexamethasone Other (See Comments)   During a tapered dose, once.  Was shaky (side effect, not allergy).      Medication List       Accurate as of January 26, 2020 10:12 AM. If you have any questions, ask your nurse or doctor.        albuterol 108 (90 Base) MCG/ACT inhaler Commonly known as: VENTOLIN HFA Inhale into the lungs.   Aller-Ease 180 MG tablet Generic drug: fexofenadine Take 180 mg by mouth daily.   ASPIRIN 81 PO Take by mouth.   azelastine 0.1 % nasal spray Commonly known as: ASTELIN Place 1 spray into both nostrils 2 (two) times daily.   Breo Ellipta 200-25 MCG/INH Aepb Generic drug: fluticasone furoate-vilanterol Take 1 puff  daily by mouth.   diclofenac sodium 1 % Gel Commonly known as: VOLTAREN Apply 4 g topically 2 (two) times daily. What changed:   when to take this  reasons to take this   DULoxetine 60 MG capsule Commonly known as: CYMBALTA Take 30 mg by mouth daily.   fluticasone 50 MCG/ACT nasal spray Commonly known as: FLONASE Place 2 sprays into both nostrils 2 (two) times daily.   gabapentin 800 MG tablet Commonly known as: NEURONTIN TAKE 1 TABLET BY MOUTH TWICE A DAY   latanoprost 0.005 % ophthalmic solution Commonly known as: XALATAN Place 1 drop into both eyes at bedtime.   levothyroxine 112 MCG tablet Commonly known as: SYNTHROID Take 112 mcg by mouth daily before breakfast.   losartan 50 MG tablet Commonly known as: COZAAR Take 50 mg by mouth every evening.   meloxicam 7.5 MG tablet Commonly known as: MOBIC Take 7.5 mg by mouth daily.   metFORMIN 500 MG tablet Commonly known as: GLUCOPHAGE Take 500 mg by mouth 2 (two) times daily with a meal.   metoprolol succinate 50 MG 24 hr tablet Commonly known as: TOPROL-XL TAKE 1 TABLET (50 MG TOTAL) BY MOUTH DAILY. TAKE WITH OR IMMEDIATELY FOLLOWING A MEAL.   montelukast 10 MG tablet Commonly known as: SINGULAIR   ONE TOUCH ULTRA TEST test strip Generic drug: glucose blood USE 2 (TWO) TIMES DAILY USE AS INSTRUCTED.   oxybutynin 5 MG tablet Commonly known as: DITROPAN Take 5 mg by mouth 2 (two) times daily.   pravastatin 10 MG tablet Commonly known as: PRAVACHOL Take 10 mg by mouth daily.   PREVAGEN PO Take by mouth.   rOPINIRole 4 MG tablet Commonly known as: REQUIP Take 1 tablet (4 mg total) by mouth 2 (two) times daily.   telmisartan 40 MG tablet Commonly known as: MICARDIS Take 40 mg by mouth daily.   topiramate 100 MG tablet Commonly known as: TOPAMAX Take 100 mg by mouth 2 (two) times daily.   traMADol 50 MG tablet Commonly known as: ULTRAM TAKE 1/2-1 TABLET BY MOUTH 2 TIMES A DAY AS NEEDED     UNABLE TO FIND 1 tablet 3 (three) times daily. Med Name: Neuriva-Plus. OTC   UNABLE TO FIND 3 tablets. Med Name: Hair la Vie. Takes 2 in the morning and 1 at night       Allergies:  Allergies  Allergen Reactions  . Diazepam Other (See Comments)    hallucinations  . Dexamethasone Other (See Comments)    During a tapered dose, once.  Was shaky (side effect,  not allergy).    Family History: Family History  Problem Relation Age of Onset  . Lung cancer Mother   . Congestive Heart Failure Father   . Depression Sister   . Rheum arthritis Sister   . Headache Maternal Grandfather   . Migraines Maternal Grandfather   . Headache Maternal Uncle   . Diabetes Other   . Heart disease Other   . Hypertension Other   . Breast cancer Neg Hx   . Bladder Cancer Neg Hx   . Kidney cancer Neg Hx     Social History:  reports that she has never smoked. She has never used smokeless tobacco. She reports that she does not drink alcohol and does not use drugs.   Physical Exam: BP (!) 167/81   Pulse 69   Ht 4\' 11"  (1.499 m)   Wt 189 lb (85.7 kg)   BMI 38.17 kg/m   Constitutional:  Alert and oriented, No acute distress. HEENT: [place] AT, moist mucus membranes.  Trachea midline, no masses. Cardiovascular: No clubbing, cyanosis, or edema. Respiratory: Normal respiratory effort, no increased work of breathing. Skin: No rashes, bruises or suspicious lesions. Neurologic: Grossly intact, no focal deficits, moving all 4 extremities. Psychiatric: Normal mood and affect.   Assessment & Plan:    1. Personal history of urinary calculi   KUB today reviewed and there is a faint calcification overlying the superior portion of the left renal outline  RTC in 1 year for KUB or earlier for development of flank/abdominal pain  2. OAB  Stable voiding symptoms  [facility] [address], [address] [place], [place] [phone]  I, [phone], am acting as a  scribe for Dr. Theador Hawthorne C. Maxmilian Trostel,  I have reviewed the above documentation for accuracy and completeness, and I agree with the above.    Lorin Picket, MD

## 2020-01-26 ENCOUNTER — Ambulatory Visit
Admission: RE | Admit: 2020-01-26 | Discharge: 2020-01-26 | Disposition: A | Discharge status: Home / Self Care | Payer: Medicare HMO | Source: Ambulatory Visit | Attending: Urology | Admitting: Urology

## 2020-01-26 ENCOUNTER — Encounter: Payer: Self-pay | Admitting: Urology

## 2020-01-26 ENCOUNTER — Other Ambulatory Visit: Payer: Self-pay

## 2020-01-26 ENCOUNTER — Ambulatory Visit: Payer: Medicare HMO | Admitting: Urology

## 2020-01-26 ENCOUNTER — Ambulatory Visit
Admission: RE | Admit: 2020-01-26 | Discharge: 2020-01-26 | Disposition: A | Discharge status: Home / Self Care | Payer: Medicare HMO | Attending: Urology | Admitting: Urology

## 2020-01-26 VITALS — BP 167/81 | HR 69 | Ht 59.0 in | Wt 189.0 lb

## 2020-01-26 DIAGNOSIS — N200 Calculus of kidney: Secondary | ICD-10-CM | POA: Insufficient documentation

## 2020-01-27 ENCOUNTER — Encounter: Payer: Self-pay | Admitting: Urology

## 2020-01-29 ENCOUNTER — Other Ambulatory Visit: Payer: Self-pay | Admitting: Neurology

## 2020-02-02 ENCOUNTER — Other Ambulatory Visit: Payer: Self-pay | Admitting: Neurology

## 2020-02-14 ENCOUNTER — Ambulatory Visit: Payer: Medicare HMO | Attending: Family Medicine

## 2020-02-14 ENCOUNTER — Other Ambulatory Visit: Payer: Self-pay

## 2020-02-14 DIAGNOSIS — G8929 Other chronic pain: Secondary | ICD-10-CM

## 2020-02-14 DIAGNOSIS — M5416 Radiculopathy, lumbar region: Secondary | ICD-10-CM | POA: Insufficient documentation

## 2020-02-14 DIAGNOSIS — M25551 Pain in right hip: Secondary | ICD-10-CM | POA: Diagnosis present

## 2020-02-14 DIAGNOSIS — M6281 Muscle weakness (generalized): Secondary | ICD-10-CM | POA: Insufficient documentation

## 2020-02-14 DIAGNOSIS — M545 Low back pain, unspecified: Secondary | ICD-10-CM | POA: Insufficient documentation

## 2020-02-14 NOTE — Patient Instructions (Signed)
  Sitting on a chair    GENTLE   Press your hands on your thighs to feel your abdominal muscles contract.   Hold for 5 seconds comfortably.   Repeat 10 times.   Perform 3 sets daily.     Perform 3 times per day.

## 2020-02-14 NOTE — Therapy (Signed)
[facility] [address], [place], [address] Phone: [phone]   Fax:  [phone]  Physical Therapy Evaluation  Patient Details  Name: Jessica Cole MRN: [id number] Date of Birth: 1952-09-71 Referring Provider (PT): Burman Freestone, NP   Encounter Date: 02/14/2020   PT End of Session - 02/14/20 1033    Visit Number 1    Number of Visits 17    Date for PT Re-Evaluation 04/12/20    Authorization Type 1    Authorization Time Period of 10 progress report    PT Start Time 1034    PT Stop Time 1143    PT Time Calculation (min) 69 min    Activity Tolerance Patient tolerated treatment well    Behavior During Therapy [facility] for tasks assessed/performed           Past Medical History:  Diagnosis Date  . Anxiety   . Colon polyps   . Degenerative arthritis   . Depression   . Diabetes mellitus without complication (HCC)    Type II  . Environmental allergies   . Family history of adverse reaction to anesthesia    sister- PONV  . Fatty liver   . Glaucoma   . Headache(784.0)    HX  MIGRAINES  . History of bronchitis   . History of kidney stones   . History of pneumonia   . Hypertension   . Hypothyroidism   . Obesity   . OSA on CPAP   . Plantar fasciitis    right  . PONV (postoperative nausea and vomiting)   . Renal calculi   . Renal calculi   . RLS (restless legs syndrome) 08/23/2014    Past Surgical History:  Procedure Laterality Date  . ABDOMINAL HYSTERECTOMY     partial  . APPENDECTOMY    . Arthroscopic surgery, knee Left   . BREAST BIOPSY Right    several  . BREAST BIOPSY Right 03/11/2017   u/s bx neg  . BUNIONECTOMY     LEFT   . COLONOSCOPY W/ POLYPECTOMY    . COLONOSCOPY WITH PROPOFOL N/A 08/14/2017   Procedure: COLONOSCOPY WITH PROPOFOL;  Surgeon: Christena Deem, MD;  Location: [facility] ENDOSCOPY;  Service: Endoscopy;  Laterality: N/A;  . EXTRACORPOREAL SHOCK WAVE LITHOTRIPSY Left  05/14/2017   Procedure: EXTRACORPOREAL SHOCK WAVE LITHOTRIPSY (ESWL);  Surgeon: Riki Altes, MD;  Location: [facility] ORS;  Service: Urology;  Laterality: Left;  . FOOT SURGERY     RIGHT    . KNEE ARTHROSCOPY W/ MENISCAL REPAIR Left   . LASIK    . MAXIMUM ACCESS (MAS)POSTERIOR LUMBAR INTERBODY FUSION (PLIF) 1 LEVEL N/A 04/25/2016   Procedure: LUMBAR FOUR-FIVE  MAXIMUM ACCESS (MAS) POSTERIOR LUMBAR INTERBODY FUSION (PLIF) with extension of instrumentation LUMBAR TWO-FIVE;  Surgeon: Tia Alert, MD;  Location: [facility] OR;  Service: Neurosurgery;  Laterality: N/A;  . MAXIMUM ACCESS (MAS)POSTERIOR LUMBAR INTERBODY FUSION (PLIF) 2 LEVEL N/A 12/13/2015   Procedure: Lumbar two-three - Lumbar three-four MAXIMUM ACCESS (MAS) POSTERIOR LUMBAR INTERBODY FUSION (PLIF)  ;  Surgeon: Tia Alert, MD;  Location: [facility] NEURO ORS;  Service: Neurosurgery;  Laterality: N/A;  . SHOULDER SURGERY     RIGHT   . TONSILLECTOMY      There were no vitals filed for this visit.    Subjective Assessment - 02/14/20 1042    Subjective Low back pain: 0/10 currently (pt sitting on chair), 9/10 at most for the past month. B  LE pain: 0/10 currently, and at most for the past month. Also states legs bother her some due to restless legs.    Pertinent History Low back pain with B sciatica. Pt also states having a migraine Sunday and Monday. Started getting worse yesteray. Feels better today. Migraine sometimes starts at the back of her head. No mirgraine currently.  Back has been bothering since June 2021. Had nerve problems B LE (sciatic) L > R. Felt like her legs were going to give way. Had 2 injections for her back which helped for a short period. Decided not to go for a 3rd shot. Had to PT for her back for 2x/week for 1-2 months at [facility] which helped a little bit. Was given a HEP which helped. Not currently doing her HEP. Has a little incontinence which her doctors know about it. Denies LE paresthesia.  Has tingling B plantar  sufrace of feet from toes to forefoot. 1 fall in the past 6 months. Pt fell onto the floor in the kitchen while getting a snack in the middle of the night. Pt was sleepy and might have fallen asleep. Does not have a fear of falling.  LE radiating symptoms stopped around September 2021.    Patient Stated Goals Be able to bend over in general such as when cleaning the house and go up and down steps more comfortably.    Currently in Pain? No/denies    Pain Location Back    Pain Orientation Lower;Posterior;Left;Right    Pain Type Chronic pain    Pain Onset More than a month ago    Pain Frequency Occasional    Aggravating Factors  Back: Sitting (for about an hour), bending over sometimes (such as picking up her cat),    Pain Relieving Factors Heating pad, ice, rest, laying down flat on her back              [facility] PT Assessment - 02/14/20 1103      Assessment   Medical Diagnosis Lumbar DDD, lumbar radiculitis,  back muscle spasm, bilateral low back pain with bilateral sciatica    Referring Provider (PT) Burman Freestone, NP    Onset Date/Surgical Date 01/03/20   Date PT referral signed   Prior Therapy Chiropractic and PT with temporary relief       Precautions   Precaution Comments No known precautions      Restrictions   Other Position/Activity Restrictions No known restrictions      Home Environment   Living Environment Private residence    Living Arrangements Spouse/significant other    Home Access Stairs to enter    Entrance Stairs-Number of Steps 6-8   back entrance   Home Layout One level      Prior Function   Level of Independence Independent    Vocation Retired    Leisure likes to bake, sew, read, gardening      Observation/Other Assessments   Focus on Therapeutic Outcomes (FOTO)  Lumbar FOTO: 49      Posture/Postural Control   Posture Comments R cervical side bend, upper thoracic kyphosis, B scapular protraction L > R, R lateral shift/side bend, L greater trochanter and  knee slightly higher      AROM   Lumbar Flexion full with R posterior hip pain     Lumbar Extension WFL with slght R rotation and B L4 dermatome pull R > L    Lumbar - Right Side Bend WFL with B L2 and 3 symptoms (to mid  thighs)    Lumbar - Left Side [facility] with R and L lateral hip pain (seems like along L2 proximally)    Lumbar - Right Rotation WFL with R low back pain    Lumbar - Left Rotation WFL with R low back and R L1 pain at hip      Strength   Overall Strength Comments Uncomfortable in S/L position    Right Hip Flexion 4-/5    Right Hip Extension 4/5   seated manually resisted   Right Hip ABduction 4/5    Left Hip Flexion 4-/5    Left Hip Extension 3+/5   seated manually resisted   Left Hip ABduction 4/5    Right Knee Flexion 5/5    Right Knee Extension 5/5    Left Knee Flexion 5/5    Left Knee Extension 5/5   with L L1 pain     Ambulation/Gait   Gait Comments Slight decreased stance R LE. B pelvic drop during stance phase                      Objective measurements completed on examination: See above findings.   No latex band allergies   Blood pressure, L arm, mechanically taken, regular cuff: 164/78, HR 67  Uncomfortable in S/L position and turning in the mat table both sides Difficulty with sit <> S/L, S/L <> supine secondary to back pain.    Therapeutic exercise Seated transversus abdominis contractoin 10x5 seconds Seated manually resisted L lateral shift isometrics 10x5 seconds. Low back discomfort, eases with rest   Seated trunk flexion.  Seated B shoulder extension isometrics, hands on thighs to promote trunk muscle strength. 10x5 seconds for 3 sets  decreasd back discomfort.   Reviewed and given as part of her HEP. Pt demonstrated and verbalized understanding. Handout provided.     Improved exercise technique, movement at target joints, use of target muscles after mod verbal, visual, tactile cues.     Response to  treatment Decreased back discomfort with gentle core muscle activation   Clinical impression  Pt is a 69 year old female who came to physical therapy secondary to low back pain. She also presents with altered gait pattern and posture, decreased trunk control, B LE radiating symptoms along the L2-4 dermatomes as well as B sciatic symptoms, bilateral hip weakness, B thoracolumbar paraspinal muscle tension, reproduction of symptoms with lumbar AROM, irritable symptoms, and difficulty performing tasks which involves bending over as well as with bed mobility secondary to back and LE pain. Pt will benefit from skilled physical therapy services to address the aforementioned deficits.             PT Education - 02/14/20 1451    Education provided Yes    Education Details ther-ex, HEP, plan of care    Person(s) Educated Patient    Methods Explanation;Demonstration;Tactile cues;Verbal cues;Handout    Comprehension Verbalized understanding;Returned demonstration            PT Short Term Goals - 02/14/20 1341      PT SHORT TERM GOAL #1   Title Pt will be independent with her initial HEP to decrease pain, improve strength, and ability to perform bed mobility more comfortably.    Baseline Pt has started her HEP (02/14/2020)    Time 3    Period Weeks    Status New    Target Date 03/08/20             PT Long  Term Goals - 02/14/20 1342      PT LONG TERM GOAL #1   Title Patient will have a decrease in low back pain to 4/10 or less at most to promote ability to perform tasks which involve bending over as well as to be able to negotiate stairs more comfortably.    Baseline 9/10 back pain at most for the past month (02/14/2020)    Time 8    Period Weeks    Status New    Target Date 04/12/20      PT LONG TERM GOAL #2   Title Patient will improve bilateral hip extension and abduction strength by at least 1/2 MMT grade to promote ability to perform standing tasks such as chores as well  as negotiate stairs more comfortably.    Baseline hip extension: 4/5 R 3+/5 L, hip abduction 4/5 R and L (02/14/2020)    Time 8    Period Weeks    Status New    Target Date 04/12/20      PT LONG TERM GOAL #3   Title Patient will improve her FOTO score by at least 10 points as a demonstration of improved function.    Baseline Lumbar FOTO 49 (02/14/2020)    Time 8    Period Weeks    Status New    Target Date 04/12/20                  Plan - 02/14/20 1335    Clinical Impression Statement Pt is a 69 year old female who came to physical therapy secondary to low back pain. She also presents with altered gait pattern and posture, decreased trunk control, B LE radiating symptoms along the L2-4 dermatomes as well as B sciatic symptoms, bilateral hip weakness, B thoracolumbar paraspinal muscle tension, reproduction of symptoms with lumbar AROM, irritable symptoms, and difficulty performing tasks which involves bending over as well as with bed mobility secondary to back and LE pain. Pt will benefit from skilled physical therapy services to address the aforementioned deficits.    Personal Factors and Comorbidities Comorbidity 3+;Age;Fitness;Past/Current Experience;Time since onset of injury/illness/exacerbation    Comorbidities Anxiety, DM, depression, hx of kidney stones, HTN, hx of lumbar fusion    Examination-Activity Limitations Sit;Transfers;Sleep;Bed Mobility;Bend;Lift;Squat;Caring for Others;Carry    Stability/Clinical Decision Making Stable/Uncomplicated    Clinical Decision Making Low    Clinical Presentation due to: improved LE symptoms per pt    Rehab Potential Fair    Clinical Impairments Affecting Rehab Potential Chronicity of condition, home dynamics, pt not performing her HEP    PT Frequency 2x / week    PT Duration 8 weeks    PT Treatment/Interventions Aquatic Therapy;Electrical Stimulation;Iontophoresis 4mg /ml Dexamethasone;Functional mobility training;Therapeutic  activities;Therapeutic exercise;Neuromuscular re-education;Patient/family education;Manual techniques;Dry needling    PT Next Visit Plan trunk, hip strengthening, thoracic extension, manual techniques, posture, modalities PRN    Consulted and Agree with Plan of Care Patient           Patient will benefit from skilled therapeutic intervention in order to improve the following deficits and impairments:  Pain, Postural dysfunction, Improper body mechanics, Decreased strength, Decreased activity tolerance  Visit Diagnosis: Chronic bilateral low back pain, unspecified whether sciatica present - Plan: PT plan of care cert/re-cert  Radiculopathy, lumbar region - Plan: PT plan of care cert/re-cert  Muscle weakness (generalized) - Plan: PT plan of care cert/re-cert     Problem List Patient Active Problem List   Diagnosis Date Noted  . 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) 01/03/2019  . Chronic cough 11/19/2016  . Morbid obesity (HCC) 09/26/2016  . Left ovarian cyst 05/30/2016  . S/P lumbar spinal fusion 12/13/2015  . Aortic calcification (HCC) 07/18/2015  . Controlled type 2 diabetes mellitus without complication (HCC) 05/16/2015  . Thrombocytopenia (HCC) 05/16/2015  . Recurrent major depressive disorder, in full remission (HCC) 05/16/2015  . Essential (primary) hypertension 05/16/2015  . Fatty infiltration of liver 03/15/2015  . Aortic valve stenosis, nonrheumatic 01/18/2015  . Sciatica of right side 01/09/2015  . Type 2 diabetes mellitus (HCC) 11/10/2014  . RLS (restless legs syndrome) 08/23/2014  . Neuritis or radiculitis due to rupture of lumbar intervertebral disc 06/13/2014  . Degeneration of intervertebral disc of lumbar region 06/13/2014  . Lumbar radiculitis 06/13/2014  . Arthritis 01/23/2014  . Arthritis of knee, degenerative 11/15/2013  . Depression 09/29/2013  . Insomnia 09/29/2013    . Apnea, sleep 08/29/2013  . History of nephrolithiasis 08/29/2013  . Abnormal presence of protein in urine 08/29/2013  . Headache, migraine 08/29/2013  . BP (high blood pressure) 08/29/2013  . HLD (hyperlipidemia) 08/29/2013  . Allergic rhinitis 08/29/2013  . Intractable migraine without aura 02/18/2013    Loralyn FreshwaterMiguel Parker Wherley PT, DPT   02/14/2020, 3:02 PM  [facility] [place] [facility] [address] [place], [place], [address] Phone: [phone]   Fax:  [phone]  Name: Nobie PutnamGail T Rugg MRN: [id number] Date of Birth: February 09, 1951

## 2020-02-16 ENCOUNTER — Other Ambulatory Visit: Payer: Self-pay

## 2020-02-16 ENCOUNTER — Ambulatory Visit: Payer: Medicare HMO

## 2020-02-16 DIAGNOSIS — M5416 Radiculopathy, lumbar region: Secondary | ICD-10-CM

## 2020-02-16 DIAGNOSIS — M545 Low back pain, unspecified: Secondary | ICD-10-CM | POA: Diagnosis not present

## 2020-02-16 DIAGNOSIS — G8929 Other chronic pain: Secondary | ICD-10-CM

## 2020-02-16 DIAGNOSIS — M6281 Muscle weakness (generalized): Secondary | ICD-10-CM

## 2020-02-16 NOTE — Patient Instructions (Addendum)
Sitting on a chair                       GENTLE              Press your hands on your thighs to feel your abdominal muscles contract.              Hold for 5 seconds comfortably.              Repeat 10 times.              Perform 3 sets daily.                Perform 3 times per day.     Seated glute max squeeze   Sitting on a chair, squeeze your rear end muscles together.     Hold for 5 seconds   Repeat 10 times   Perform 3 sets    Perform at least 3 times a day if able

## 2020-02-16 NOTE — Therapy (Signed)
[place] [facility] PHYSICAL AND SPORTS MEDICINE [address] [place], [address] Phone: [phone]   Fax:  [phone]  Physical Therapy Treatment  Patient Details  Name: Jessica Cole MRN: [id number] Date of Birth: [birth year] Referring Provider (PT): Burman Freestone, NP   Encounter Date: 02/16/2020   PT End of Session - 02/16/20 1120    Visit Number 2    Number of Visits 17    Date for PT Re-Evaluation 04/12/20    Authorization Type 2    Authorization Time Period of 10 progress report    PT Start Time 1120    PT Stop Time 1203    PT Time Calculation (min) 43 min    Activity Tolerance Patient tolerated treatment well    Behavior During Therapy [facility] for tasks assessed/performed           Past Medical History:  Diagnosis Date  . Anxiety   . Colon polyps   . Degenerative arthritis   . Depression   . Diabetes mellitus without complication (HCC)    Type II  . Environmental allergies   . Family history of adverse reaction to anesthesia    sister- PONV  . Fatty liver   . Glaucoma   . Headache(784.0)    HX  MIGRAINES  . History of bronchitis   . History of kidney stones   . History of pneumonia   . Hypertension   . Hypothyroidism   . Obesity   . OSA on CPAP   . Plantar fasciitis    right  . PONV (postoperative nausea and vomiting)   . Renal calculi   . Renal calculi   . RLS (restless legs syndrome) 08/23/2014    Past Surgical History:  Procedure Laterality Date  . ABDOMINAL HYSTERECTOMY     partial  . APPENDECTOMY    . Arthroscopic surgery, knee Left   . BREAST BIOPSY Right    several  . BREAST BIOPSY Right 03/11/2017   u/s bx neg  . BUNIONECTOMY     LEFT   . COLONOSCOPY W/ POLYPECTOMY    . COLONOSCOPY WITH PROPOFOL N/A 08/14/2017   Procedure: COLONOSCOPY WITH PROPOFOL;  Surgeon: Christena Deem, MD;  Location: [name] ENDOSCOPY;  Service: Endoscopy;  Laterality: N/A;  . EXTRACORPOREAL SHOCK WAVE LITHOTRIPSY Left  05/14/2017   Procedure: EXTRACORPOREAL SHOCK WAVE LITHOTRIPSY (ESWL);  Surgeon: Riki Altes, MD;  Location: [facility] ORS;  Service: Urology;  Laterality: Left;  . FOOT SURGERY     RIGHT    . KNEE ARTHROSCOPY W/ MENISCAL REPAIR Left   . LASIK    . MAXIMUM ACCESS (MAS)POSTERIOR LUMBAR INTERBODY FUSION (PLIF) 1 LEVEL N/A 04/25/2016   Procedure: LUMBAR FOUR-FIVE  MAXIMUM ACCESS (MAS) POSTERIOR LUMBAR INTERBODY FUSION (PLIF) with extension of instrumentation LUMBAR TWO-FIVE;  Surgeon: Tia Alert, MD;  Location: [facility] OR;  Service: Neurosurgery;  Laterality: N/A;  . MAXIMUM ACCESS (MAS)POSTERIOR LUMBAR INTERBODY FUSION (PLIF) 2 LEVEL N/A 12/13/2015   Procedure: Lumbar two-three - Lumbar three-four MAXIMUM ACCESS (MAS) POSTERIOR LUMBAR INTERBODY FUSION (PLIF)  ;  Surgeon: Tia Alert, MD;  Location: [facility] NEURO ORS;  Service: Neurosurgery;  Laterality: N/A;  . SHOULDER SURGERY     RIGHT   . TONSILLECTOMY      There were no vitals filed for this visit.   Subjective Assessment - 02/16/20 1122    Subjective Back is a little bit sore currently. 5/10 currently.    Pertinent History Low back pain with  B sciatica. Pt also states having a migraine Sunday and Monday. Started getting worse yesteray. Feels better today. Migraine sometimes starts at the back of her head. No mirgraine currently.  Back has been bothering since June 2021. Had nerve problems B LE (sciatic) L > R. Felt like her legs were going to give way. Had 2 injections for her back which helped for a short period. Decided not to go for a 3rd shot. Had to PT for her back for 2x/week for 1-2 months at [facility] clinic which helped a little bit. Was given a HEP which helped. Not currently doing her HEP. Has a little incontinence which her doctors know about it. Denies LE paresthesia.  Has tingling B plantar sufrace of feet from toes to forefoot. 1 fall in the past 6 months. Pt fell onto the floor in the kitchen while getting a snack in the middle of the  night. Pt was sleepy and might have fallen asleep. Does not have a fear of falling.  LE radiating symptoms stopped around September 2021.    Patient Stated Goals Be able to bend over in general such as when cleaning the house and go up and down steps more comfortably.    Currently in Pain? Yes    Pain Score 5     Pain Onset More than a month ago                                     PT Education - 02/16/20 1317    Education provided Yes    Education Details ther-ex, HEP    Person(s) Educated Patient    Methods Explanation;Demonstration;Tactile cues;Verbal cues;Handout    Comprehension Returned demonstration;Verbalized understanding           Objective  No latex band allergies   Blood pressure, L arm, mechanically taken, regular cuff: 164/78, HR 67 Uncomfortable in S/L position and turning in the mat table both sides Difficulty with sit <> S/L, S/L <> supine secondary to back pain.    Having L shoulder surgery 03/01/2020 (L shoulder replacement)   Manual therapy In sitting,   STM R > L  lumbar paraspinal muscles to decrease tension and fascial restrictions    Gentle STM B upper traps to decrease tension.   Therapeutic exercise  Seated B scapular retraction 10x3 to promote thoracic extension   Seated glute max squeeze 10x5 seconds for 3 sets    Seated B shoulder extension isometrics, hands on thighs to promote trunk muscle strength. 10x5 seconds for 3 sets  Seated B scapular retraction again 5 seconds 2x 10 to promote scapular strength and thoracic extension  Seated naval ins gentle 10x5 seconds    Improved exercise technique, movement at target joints, use of target muscles after mod verbal, visual, tactile cues.     Response to treatment Decreased low back pain to 1/10 after session.    Clinical impression Pt demonstrates irritable symptoms with discomfort at times during exercises. Pain to her low back however improved to  1/10 at end of session. Worked on improving core and glute muscle activation as well as gentle thoracic extension to help decrease stress to her low back. Pt will benefit from continued skilled physical therapy services to decrease pain, improve strength and function.     PT Short Term Goals - 02/14/20 1341      PT SHORT TERM GOAL #1   Title Pt will  be independent with her initial HEP to decrease pain, improve strength, and ability to perform bed mobility more comfortably.    Baseline Pt has started her HEP (02/14/2020)    Time 3    Period Weeks    Status New    Target Date 03/08/20             PT Long Term Goals - 02/14/20 1342      PT LONG TERM GOAL #1   Title Patient will have a decrease in low back pain to 4/10 or less at most to promote ability to perform tasks which involve bending over as well as to be able to negotiate stairs more comfortably.    Baseline 9/10 back pain at most for the past month (02/14/2020)    Time 8    Period Weeks    Status New    Target Date 04/12/20      PT LONG TERM GOAL #2   Title Patient will improve bilateral hip extension and abduction strength by at least 1/2 MMT grade to promote ability to perform standing tasks such as chores as well as negotiate stairs more comfortably.    Baseline hip extension: 4/5 R 3+/5 L, hip abduction 4/5 R and L (02/14/2020)    Time 8    Period Weeks    Status New    Target Date 04/12/20      PT LONG TERM GOAL #3   Title Patient will improve her FOTO score by at least 10 points as a demonstration of improved function.    Baseline Lumbar FOTO 49 (02/14/2020)    Time 8    Period Weeks    Status New    Target Date 04/12/20                 Plan - 02/16/20 1318    Clinical Impression Statement Pt demonstrates irritable symptoms with discomfort at times during exercises. Pain to her low back however improved to 1/10 at end of session. Worked on improving core and glute muscle activation as well as gentle  thoracic extension to help decrease stress to her low back. Pt will benefit from continued skilled physical therapy services to decrease pain, improve strength and function.    Personal Factors and Comorbidities Comorbidity 3+;Age;Fitness;Past/Current Experience;Time since onset of injury/illness/exacerbation    Comorbidities Anxiety, DM, depression, hx of kidney stones, HTN, hx of lumbar fusion    Examination-Activity Limitations Sit;Transfers;Sleep;Bed Mobility;Bend;Lift;Squat;Caring for Others;Carry    Stability/Clinical Decision Making Stable/Uncomplicated    Rehab Potential Fair    Clinical Impairments Affecting Rehab Potential Chronicity of condition, home dynamics, pt not performing her HEP    PT Frequency 2x / week    PT Duration 8 weeks    PT Treatment/Interventions Aquatic Therapy;Electrical Stimulation;Iontophoresis 4mg /ml Dexamethasone;Functional mobility training;Therapeutic activities;Therapeutic exercise;Neuromuscular re-education;Patient/family education;Manual techniques;Dry needling    PT Next Visit Plan trunk, hip strengthening, thoracic extension, manual techniques, posture, modalities PRN    Consulted and Agree with Plan of Care Patient           Patient will benefit from skilled therapeutic intervention in order to improve the following deficits and impairments:  Pain, Postural dysfunction, Improper body mechanics, Decreased strength, Decreased activity tolerance  Visit Diagnosis: Chronic bilateral low back pain, unspecified whether sciatica present  Radiculopathy, lumbar region  Muscle weakness (generalized)     Problem List Patient Active Problem List   Diagnosis Date Noted  . 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 edema associated  with type 2 diabetes mellitus (HCC) 01/03/2019  . Chronic cough 11/19/2016  . Morbid obesity (HCC) 09/26/2016  . Left ovarian cyst 05/30/2016  . S/P lumbar spinal fusion 12/13/2015  . Aortic  calcification (HCC) 07/18/2015  . Controlled type 2 diabetes mellitus without complication (HCC) 05/16/2015  . Thrombocytopenia (HCC) 05/16/2015  . Recurrent major depressive disorder, in full remission (HCC) 05/16/2015  . Essential (primary) hypertension 05/16/2015  . Fatty infiltration of liver 03/15/2015  . Aortic valve stenosis, nonrheumatic 01/18/2015  . Sciatica of right side 01/09/2015  . Type 2 diabetes mellitus (HCC) 11/10/2014  . RLS (restless legs syndrome) 08/23/2014  . Neuritis or radiculitis due to rupture of lumbar intervertebral disc 06/13/2014  . Degeneration of intervertebral disc of lumbar region 06/13/2014  . Lumbar radiculitis 06/13/2014  . Arthritis 01/23/2014  . Arthritis of knee, degenerative 11/15/2013  . Depression 09/29/2013  . Insomnia 09/29/2013  . Apnea, sleep 08/29/2013  . History of nephrolithiasis 08/29/2013  . Abnormal presence of protein in urine 08/29/2013  . Headache, migraine 08/29/2013  . BP (high blood pressure) 08/29/2013  . HLD (hyperlipidemia) 08/29/2013  . Allergic rhinitis 08/29/2013  . Intractable migraine without aura 02/18/2013    Loralyn FreshwaterMiguel Lorene Klimas PT, DPT   02/16/2020, 1:24 PM  [place] [facility] [address] , [place], [address] Phone: [phone]   Fax:  [phone]  Name: Jessica Cole MRN: [id number] Date of Birth: 1950/11/20

## 2020-02-20 ENCOUNTER — Ambulatory Visit: Payer: Medicare HMO

## 2020-02-20 ENCOUNTER — Other Ambulatory Visit: Payer: Self-pay

## 2020-02-20 DIAGNOSIS — M5416 Radiculopathy, lumbar region: Secondary | ICD-10-CM

## 2020-02-20 DIAGNOSIS — G8929 Other chronic pain: Secondary | ICD-10-CM

## 2020-02-20 DIAGNOSIS — M545 Low back pain, unspecified: Secondary | ICD-10-CM

## 2020-02-20 DIAGNOSIS — M25551 Pain in right hip: Secondary | ICD-10-CM

## 2020-02-20 DIAGNOSIS — M6281 Muscle weakness (generalized): Secondary | ICD-10-CM

## 2020-02-20 NOTE — Therapy (Signed)
[place] [facility] [address] [place], [place], [address] Phone: [phone]   Fax:  [phone]  Physical Therapy Treatment  Patient Details  Name: Jessica Cole MRN: [id number] Date of Birth: 20-Nov-1950 Referring Provider (PT): Burman FreestoneWhitney Meeler, NP   Encounter Date: 02/20/2020   PT End of Session - 02/20/20 1600    Visit Number 3    Number of Visits 17    Date for PT Re-Evaluation 04/12/20    Authorization Type 3    Authorization Time Period of 10 progress report    PT Start Time 1600    PT Stop Time 1640    PT Time Calculation (min) 40 min    Activity Tolerance Patient tolerated treatment well    Behavior During Therapy [facility] for tasks assessed/performed           Past Medical History:  Diagnosis Date  . Anxiety   . Colon polyps   . Degenerative arthritis   . Depression   . Diabetes mellitus without complication (HCC)    Type II  . Environmental allergies   . Family history of adverse reaction to anesthesia    sister- PONV  . Fatty liver   . Glaucoma   . Headache(784.0)    HX  MIGRAINES  . History of bronchitis   . History of kidney stones   . History of pneumonia   . Hypertension   . Hypothyroidism   . Obesity   . OSA on CPAP   . Plantar fasciitis    right  . PONV (postoperative nausea and vomiting)   . Renal calculi   . Renal calculi   . RLS (restless legs syndrome) 08/23/2014    Past Surgical History:  Procedure Laterality Date  . ABDOMINAL HYSTERECTOMY     partial  . APPENDECTOMY    . Arthroscopic surgery, knee Left   . BREAST BIOPSY Right    several  . BREAST BIOPSY Right 03/11/2017   u/s bx neg  . BUNIONECTOMY     LEFT   . COLONOSCOPY W/ POLYPECTOMY    . COLONOSCOPY WITH PROPOFOL N/A 08/14/2017   Procedure: COLONOSCOPY WITH PROPOFOL;  Surgeon: Christena DeemSkulskie, Martin U, MD;  [facility];  Service: Endoscopy;  Laterality: N/A;  . EXTRACORPOREAL SHOCK WAVE LITHOTRIPSY Left  05/14/2017   Procedure: EXTRACORPOREAL SHOCK WAVE LITHOTRIPSY (ESWL);  Surgeon: Riki AltesStoioff, Scott C, MD;  Location: [facility] ORS;  Service: Urology;  Laterality: Left;  . FOOT SURGERY     RIGHT    . KNEE ARTHROSCOPY W/ MENISCAL REPAIR Left   . LASIK    . MAXIMUM ACCESS (Cole)POSTERIOR LUMBAR INTERBODY FUSION (PLIF) 1 LEVEL N/A 04/25/2016   Procedure: LUMBAR FOUR-FIVE  MAXIMUM ACCESS (Cole) POSTERIOR LUMBAR INTERBODY FUSION (PLIF) with extension of instrumentation LUMBAR TWO-FIVE;  Surgeon: Tia Alertavid S Jones, MD;  [facility];  Service: Neurosurgery;  Laterality: N/A;  . MAXIMUM ACCESS (Cole)POSTERIOR LUMBAR INTERBODY FUSION (PLIF) 2 LEVEL N/A 12/13/2015   Procedure: Lumbar two-three - Lumbar three-four MAXIMUM ACCESS (Cole) POSTERIOR LUMBAR INTERBODY FUSION (PLIF)  ;  Surgeon: Tia Alertavid S Jones, MD;  [facility];  Service: Neurosurgery;  Laterality: N/A;  . SHOULDER SURGERY     RIGHT   . TONSILLECTOMY      There were no vitals filed for this visit.   Subjective Assessment - 02/20/20 1602    Subjective Having L total shoulder replacement next week on 03/01/2020.  Also has a kidney stone which makes her  want to throw up.  4/10 currently in sitting, 5.5/10 when walking from waiting room to gym.    Pertinent History Low back pain with B sciatica. Pt also states having a migraine Sunday and Monday. Started getting worse yesteray. Feels better today. Migraine sometimes starts at the back of her head. No mirgraine currently.  Back has been bothering since June 2021. Had nerve problems B LE (sciatic) L > R. Felt like her legs were going to give way. Had 2 injections for her back which helped for a short period. Decided not to go for a 3rd shot. Had to PT for her back for 2x/week for 1-2 months at [facility] clinic which helped a little bit. Was given a HEP which helped. Not currently doing her HEP. Has a little incontinence which her doctors know about it. Denies LE paresthesia.  Has tingling B plantar sufrace of  feet from toes to forefoot. 1 fall in the past 6 months. Pt fell onto the floor in the kitchen while getting a snack in the middle of the night. Pt was sleepy and might have fallen asleep. Does not have a fear of falling.  LE radiating symptoms stopped around September 2021.    Patient Stated Goals Be able to bend over in general such as when cleaning the house and go up and down steps more comfortably.    Currently in Pain? Yes    Pain Score 6    5.5/10   Pain Onset More than a month ago                                     PT Education - 02/20/20 1616    Education provided Yes    Education Details ther-ex    [employer]) Educated Patient    Methods Explanation;Demonstration;Tactile cues;Verbal cues    Comprehension Returned demonstration;Verbalized understanding          Objective  No latex band allergies  Blood pressure, L arm, mechanically taken, regular cuff: 164/78, HR 67 Uncomfortable in S/L position and turning in the mat table both sides Difficulty with sit <>S/L, S/L <>supine secondary to back pain.    Having L shoulder surgery 03/01/2020 (L shoulder replacement)  Medbridge Access Code [email or web address]  Manual therapy In sitting,              STM R > L  lumbar paraspinal muscles to decrease tension and fascial restrictions     Therapeutic exercise   Seated glute max squeeze 10x5 seconds for 3 sets   Seated B ankle DF/PF 10x3 to promote LE neural flossing/mobility   Seated B scapular retraction 10x3 to promote thoracic extension   Seated L and R weight shifting 10x3 each side   Seated gentle anterior and posterior pelvic tilt 10x3 each side  Seated naval ins gentle 10x5 seconds for 3 sets        Improved exercise technique, movement at target joints, use of target muscles after mod verbal, visual, tactile cues.    Response to treatment Pt tolerated session well without aggravation of symptoms.     Clinical  impression Worked on improving gentle low back movement and neural mobility, gentle core muscle activatoin, thoracic extension to decrease stress to low back. Fair tolerance to today's session. 4/10 back pain in standing at end of session. Pt will benefit from continued skilled physical therapy services to decrease pain, improve strength and function.  PT Short Term Goals - 02/14/20 1341      PT SHORT TERM GOAL #1   Title Pt will be independent with her initial HEP to decrease pain, improve strength, and ability to perform bed mobility more comfortably.    Baseline Pt has started her HEP (02/14/2020)    Time 3    Period Weeks    Status New    Target Date 03/08/20             PT Long Term Goals - 02/14/20 1342      PT LONG TERM GOAL #1   Title Patient will have a decrease in low back pain to 4/10 or less at most to promote ability to perform tasks which involve bending over as well as to be able to negotiate stairs more comfortably.    Baseline 9/10 back pain at most for the past month (02/14/2020)    Time 8    Period Weeks    Status New    Target Date 04/12/20      PT LONG TERM GOAL #2   Title Patient will improve bilateral hip extension and abduction strength by at least 1/2 MMT grade to promote ability to perform standing tasks such as chores as well as negotiate stairs more comfortably.    Baseline hip extension: 4/5 R 3+/5 L, hip abduction 4/5 R and L (02/14/2020)    Time 8    Period Weeks    Status New    Target Date 04/12/20      PT LONG TERM GOAL #3   Title Patient will improve her FOTO score by at least 10 points as a demonstration of improved function.    Baseline Lumbar FOTO 49 (02/14/2020)    Time 8    Period Weeks    Status New    Target Date 04/12/20                 Plan - 02/20/20 1617    Clinical Impression Statement Worked on improving gentle low back movement and neural mobility, gentle core muscle activatoin, thoracic extension to  decrease stress to low back. Fair tolerance to today's session. 4/10 back pain in standing at end of session. Pt will benefit from continued skilled physical therapy services to decrease pain, improve strength and function.    Personal Factors and Comorbidities Comorbidity 3+;Age;Fitness;Past/Current Experience;Time since onset of injury/illness/exacerbation    Comorbidities Anxiety, DM, depression, hx of kidney stones, HTN, hx of lumbar fusion    Examination-Activity Limitations Sit;Transfers;Sleep;Bed Mobility;Bend;Lift;Squat;Caring for Others;Carry    Stability/Clinical Decision Making Stable/Uncomplicated    Clinical Decision Making Low    Rehab Potential Fair    Clinical Impairments Affecting Rehab Potential Chronicity of condition, home dynamics, pt not performing her HEP    PT Frequency 2x / week    PT Duration 8 weeks    PT Treatment/Interventions Aquatic Therapy;Electrical Stimulation;Iontophoresis 4mg /ml Dexamethasone;Functional mobility training;Therapeutic activities;Therapeutic exercise;Neuromuscular re-education;Patient/family education;Manual techniques;Dry needling    PT Next Visit Plan trunk, hip strengthening, thoracic extension, manual techniques, posture, modalities PRN    Consulted and Agree with Plan of Care Patient           Patient will benefit from skilled therapeutic intervention in order to improve the following deficits and impairments:  Pain, Postural dysfunction, Improper body mechanics, Decreased strength, Decreased activity tolerance  Visit Diagnosis: Chronic bilateral low back pain, unspecified whether sciatica present  Radiculopathy, lumbar region  Muscle weakness (generalized)  Low back pain, unspecified back pain laterality,  unspecified chronicity, unspecified whether sciatica present  Pain in right hip     Problem List Patient Active Problem List   Diagnosis Date Noted  . 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  edema associated with type 2 diabetes mellitus (HCC) 01/03/2019  . Chronic cough 11/19/2016  . Morbid obesity (HCC) 09/26/2016  . Left ovarian cyst 05/30/2016  . S/P lumbar spinal fusion 12/13/2015  . Aortic calcification (HCC) 07/18/2015  . Controlled type 2 diabetes mellitus without complication (HCC) 05/16/2015  . Thrombocytopenia (HCC) 05/16/2015  . Recurrent major depressive disorder, in full remission (HCC) 05/16/2015  . Essential (primary) hypertension 05/16/2015  . Fatty infiltration of liver 03/15/2015  . Aortic valve stenosis, nonrheumatic 01/18/2015  . Sciatica of right side 01/09/2015  . Type 2 diabetes mellitus (HCC) 11/10/2014  . RLS (restless legs syndrome) 08/23/2014  . Neuritis or radiculitis due to rupture of lumbar intervertebral disc 06/13/2014  . Degeneration of intervertebral disc of lumbar region 06/13/2014  . Lumbar radiculitis 06/13/2014  . Arthritis 01/23/2014  . Arthritis of knee, degenerative 11/15/2013  . Depression 09/29/2013  . Insomnia 09/29/2013  . Apnea, sleep 08/29/2013  . History of nephrolithiasis 08/29/2013  . Abnormal presence of protein in urine 08/29/2013  . Headache, migraine 08/29/2013  . BP (high blood pressure) 08/29/2013  . HLD (hyperlipidemia) 08/29/2013  . Allergic rhinitis 08/29/2013  . Intractable migraine without aura 02/18/2013    Loralyn Freshwater PT, DPT   02/20/2020, 5:54 PM  [place] [facility] PHYSICAL AND SPORTS MEDICINE [address], [place], [address] Phone: [phone]   Fax:  [phone]  Name: Jessica Cole MRN: [id number] Date of Birth: 05/02/1951

## 2020-02-20 NOTE — Patient Instructions (Signed)
Access Code: [email or web address] URL: [email or web address] Date: 02/20/2020 Prepared by: Loralyn Freshwater  Exercises Seated Pelvic Tilt - 1 x daily - 7 x weekly - 3 sets - 10 reps

## 2020-02-21 ENCOUNTER — Other Ambulatory Visit: Payer: Self-pay | Admitting: Surgery

## 2020-02-22 ENCOUNTER — Ambulatory Visit: Payer: Medicare HMO

## 2020-02-22 ENCOUNTER — Other Ambulatory Visit: Payer: Self-pay

## 2020-02-22 ENCOUNTER — Encounter
Admission: RE | Admit: 2020-02-22 | Discharge: 2020-02-22 | Disposition: A | Discharge status: Home / Self Care | Payer: Medicare HMO | Source: Ambulatory Visit | Attending: Surgery | Admitting: Surgery

## 2020-02-22 DIAGNOSIS — Z01818 Encounter for other preprocedural examination: Secondary | ICD-10-CM | POA: Diagnosis not present

## 2020-02-22 HISTORY — DX: Unspecified asthma, uncomplicated: J45.909

## 2020-02-22 LAB — TYPE AND SCREEN
ABO/RH(D): B POS
Antibody Screen: NEGATIVE

## 2020-02-22 LAB — CBC WITH DIFFERENTIAL/PLATELET
Abs Immature Granulocytes: 0.02 10*3/uL (ref 0.00–0.07)
Basophils Absolute: 0.1 10*3/uL (ref 0.0–0.1)
Basophils Relative: 2 %
Eosinophils Absolute: 0.1 10*3/uL (ref 0.0–0.5)
Eosinophils Relative: 2 %
HCT: 39.8 % (ref 36.0–46.0)
Hemoglobin: 13.7 g/dL (ref 12.0–15.0)
Immature Granulocytes: 0 %
Lymphocytes Relative: 30 %
Lymphs Abs: 1.6 10*3/uL (ref 0.7–4.0)
MCH: 31.3 pg (ref 26.0–34.0)
MCHC: 34.4 g/dL (ref 30.0–36.0)
MCV: 90.9 fL (ref 80.0–100.0)
Monocytes Absolute: 0.6 10*3/uL (ref 0.1–1.0)
Monocytes Relative: 11 %
Neutro Abs: 3 10*3/uL (ref 1.7–7.7)
Neutrophils Relative %: 55 %
Platelets: 161 10*3/uL (ref 150–400)
RBC: 4.38 MIL/uL (ref 3.87–5.11)
RDW: 12.7 % (ref 11.5–15.5)
WBC: 5.4 10*3/uL (ref 4.0–10.5)
nRBC: 0 % (ref 0.0–0.2)

## 2020-02-22 LAB — COMPREHENSIVE METABOLIC PANEL
ALT: 28 U/L (ref 0–44)
AST: 20 U/L (ref 15–41)
Albumin: 4.4 g/dL (ref 3.5–5.0)
Alkaline Phosphatase: 50 U/L (ref 38–126)
Anion gap: 10 (ref 5–15)
BUN: 16 mg/dL (ref 8–23)
CO2: 26 mmol/L (ref 22–32)
Calcium: 9.4 mg/dL (ref 8.9–10.3)
Chloride: 105 mmol/L (ref 98–111)
Creatinine, Ser: 0.68 mg/dL (ref 0.44–1.00)
GFR, Estimated: >60 mL/min (ref >60)
Glucose, Bld: 99 mg/dL (ref 70–99)
Potassium: 3.6 mmol/L (ref 3.5–5.1)
Sodium: 141 mmol/L (ref 135–145)
Total Bilirubin: 0.6 mg/dL (ref 0.3–1.2)
Total Protein: 7.1 g/dL (ref 6.5–8.1)

## 2020-02-22 LAB — URINALYSIS, ROUTINE W REFLEX MICROSCOPIC
Bilirubin Urine: NEGATIVE
Glucose, UA: NEGATIVE mg/dL
Hgb urine dipstick: NEGATIVE
Ketones, ur: NEGATIVE mg/dL
Nitrite: POSITIVE — AB
Protein, ur: NEGATIVE mg/dL
Specific Gravity, Urine: 1.023 (ref 1.005–1.030)
pH: 5 (ref 5.0–8.0)

## 2020-02-22 NOTE — Patient Instructions (Addendum)
Your procedure is scheduled on: 03/01/20- Thursday Report to Day Surgery on the 2nd floor of the [facility]. To find out your arrival time, please call [phone] between 1PM - 3PM on: 02/29/20- Wednesday  REMEMBER: Instructions that are not followed completely may result in serious medical risk, up to and including death; or upon the discretion of your surgeon and anesthesiologist your surgery may need to be rescheduled.  Do not eat food after midnight the night before surgery.  No gum chewing, lozengers or hard candies.  You may however, drink CLEAR liquids up to 2 hours before you are scheduled to arrive for your surgery. Do not drink anything within 2 hours of your scheduled arrival time.  Type 1 and Type 2 diabetics should only drink water.  In addition, your doctor has ordered for you to drink the provided  Gatorade G2 Drinking this carbohydrate drink up to two hours before surgery helps to reduce insulin resistance and improve patient outcomes. Please complete drinking 2 hours prior to scheduled arrival time.  TAKE THESE MEDICATIONS THE MORNING OF SURGERY WITH A SIP OF WATER:  - azelastine (ASTELIN) 0.1 % nasal spray - DULoxetine (CYMBALTA) 60 MG capsule  - fluticasone (FLONASE) 50 MCG/ACT nasal spray - gabapentin (NEURONTIN) 800 MG tablet - levothyroxine (SYNTHROID, LEVOTHROID) 112 MCG tablet - metoprolol succinate (TOPROL-XL) 50 MG 24 hr tablet - rOPINIRole (REQUIP) 4 MG tablet  -Stop Metformin  2 days prior to surgery. Do not take 10/26 , 10/27, or the morning of surgery. -Do not take the morning of surgery- Telmisartan 80 mg or Aspirin 81 mg.  One week prior to surgery: Stop Anti-inflammatories (NSAIDS) such as Meloxicam , Advil, Aleve, Ibuprofen, Motrin, Naproxen, Naprosyn and Aspirin based products such as Excedrin, Goodys Powder, BC Powder. Stop 0n 02/23/20.  Stop ANY OVER THE COUNTER supplements until after surgery. Stop Hair [place] on 02/23/20. (You may  continue taking Tylenol, Vitamin D, Vitamin B, and multivitamin.)  No Alcohol for 24 hours before or after surgery.  No Smoking including e-cigarettes for 24 hours prior to surgery.  No chewable tobacco products for at least 6 hours prior to surgery.  No nicotine patches on the day of surgery.  Do not use any "recreational" drugs for at least a week prior to your surgery.  Please be advised that the combination of cocaine and anesthesia may have negative outcomes, up to and including death. If you test positive for cocaine, your surgery will be cancelled.  On the morning of surgery brush your teeth with toothpaste and water, you may rinse your mouth with mouthwash if you wish. Do not swallow any toothpaste or mouthwash.  Do not wear jewelry, make-up, hairpins, clips or nail polish.  Do not wear lotions, powders, or perfumes.   Do not shave 48 hours prior to surgery.   Contact lenses, hearing aids and dentures may not be worn into surgery.  Do not bring valuables to the hospital. [facility] is not responsible for any missing/lost belongings or valuables.   Use CHG Soap or wipes as directed on instruction sheet.  Total Shoulder Arthroplasty:  use Benzolyl Peroxide 5% Gel as directed on instruction sheet.  - Bring your CPAP machine with you to the hospital.  Notify your doctor if there is any change in your medical condition (cold, fever, infection).  Wear comfortable clothing (specific to your surgery type) to the hospital.  Plan for stool softeners for home use; pain medications have a tendency to cause constipation.  You can also help prevent constipation by eating foods high in fiber such as fruits and vegetables and drinking plenty of fluids as your diet allows.  After surgery, you can help prevent lung complications by doing breathing exercises.  Take deep breaths and cough every 1-2 hours. Your doctor may order a device called an Incentive Spirometer to help you take deep  breaths. When coughing or sneezing, hold a pillow firmly against your incision with both hands. This is called "splinting." Doing this helps protect your incision. It also decreases belly discomfort.  If you are being admitted to the hospital overnight, leave your suitcase in the car. After surgery it may be brought to your room.  If you are being discharged the day of surgery, you will not be allowed to drive home. You will need a responsible adult (18 years or older) to drive you home and stay with you that night.   If you are taking public transportation, you will need to have a responsible adult (18 years or older) with you. Please confirm with your physician that it is acceptable to use public transportation.   Please call the Pre-admissions Testing Dept. at [phone] if you have any questions about these instructions.  Visitation Policy:  Patients undergoing a surgery or procedure may have one family member or support person with them as long as that person is not COVID-19 positive or experiencing its symptoms.  That person may remain in the waiting area during the procedure.  Inpatient Visitation Update:   In an effort to ensure the safety of our team members and our patients, we are implementing a change to our visitation policy:  Effective Monday, Aug. 9, at 7 a.m., inpatients will be allowed one support person.  o The support person may change daily.  o The support person must pass our screening, gel in and out, and wear a mask at all times, including in the patient's room.  o Patients must also wear a mask when staff or their support person are in the room.  o Masking is required regardless of vaccination status.  Systemwide, no visitors 17 or younger.

## 2020-02-22 NOTE — Progress Notes (Signed)
  [facility] Perioperative Services: Pre-Admission/Anesthesia Testing  Abnormal Lab Notification   Date: 02/22/20  Name: [name] MRN:   [id number]  Re: Abnormal labs noted during PAT appointment   Provider(s) Notified: Poggi, Excell Seltzer, MD Notification mode: Routed and/or faxed via [facility]   ABNORMAL LAB VALUE(S): Lab Results  Component Value Date   COLORURINE YELLOW (A) 02/22/2020   APPEARANCEUR HAZY (A) 02/22/2020   LABSPEC 1.023 02/22/2020   PHURINE 5.0 02/22/2020   GLUCOSEU NEGATIVE 02/22/2020   HGBUR NEGATIVE 02/22/2020   BILIRUBINUR NEGATIVE 02/22/2020   KETONESUR NEGATIVE 02/22/2020   PROTEINUR NEGATIVE 02/22/2020   NITRITE POSITIVE (A) 02/22/2020   LEUKOCYTESUR TRACE (A) 02/22/2020   EPIU 0-5 02/22/2020   WBCU 6-10 02/22/2020   RBCU 0-5 02/22/2020   BACTERIA MANY (A) 02/22/2020   Notes:  UA performed in PAT consistent with/concerning for infection. Patient scheduled for REVERSE SHOULDER ARTHROPLASTY (Left Shoulder) on 03/01/2020.    Marland Kitchen No leukocytosis noted on CBC.  Marland Kitchen Renal function normal; Estimated Creatinine Clearance: 61.6 mL/min (by C-G formula based on SCr of 0.68 mg/dL).  . Urine C&S added to assess for pathogenically significant growth.  Will forward UA, and subsequent C&S results, to attending surgeon for review and Tx as deemed appropriate. This is a Personal assistant; no formal response is required.  Jessica Mulling, MSN, APRN, FNP-C, CEN [facility]  Peri-operative Services Nurse Practitioner Phone: [phone] 02/22/20 2:58 PM

## 2020-02-24 LAB — URINE CULTURE: Culture: >=100000 — AB

## 2020-02-27 ENCOUNTER — Other Ambulatory Visit: Payer: Self-pay

## 2020-02-27 ENCOUNTER — Ambulatory Visit: Payer: Medicare HMO

## 2020-02-27 DIAGNOSIS — M25551 Pain in right hip: Secondary | ICD-10-CM

## 2020-02-27 DIAGNOSIS — G8929 Other chronic pain: Secondary | ICD-10-CM

## 2020-02-27 DIAGNOSIS — M545 Low back pain, unspecified: Secondary | ICD-10-CM | POA: Diagnosis not present

## 2020-02-27 DIAGNOSIS — M6281 Muscle weakness (generalized): Secondary | ICD-10-CM

## 2020-02-27 DIAGNOSIS — M5416 Radiculopathy, lumbar region: Secondary | ICD-10-CM

## 2020-02-27 NOTE — Therapy (Signed)
[facility] [address], [place], [address] Phone: [phone]   Fax:  [phone]  Physical Therapy Treatment  Patient Details  Name: Jessica Cole MRN: [id number] Date of Birth: 1951/02/19 Referring Provider (PT): Burman Freestone, NP   Encounter Date: 02/27/2020   PT End of Session - 02/27/20 0853    Visit Number 4    Number of Visits 17    Date for PT Re-Evaluation 04/12/20    Authorization Type 4    Authorization Time Period of 10 progress report    PT Start Time 0850    PT Stop Time 0930    PT Time Calculation (min) 40 min    Activity Tolerance Patient tolerated treatment well    Behavior During Therapy [facility] for tasks assessed/performed           Past Medical History:  Diagnosis Date  . Anxiety   . Asthma    seasonal with allergies  . Colon polyps   . Degenerative arthritis   . Depression   . Diabetes mellitus without complication (HCC)    Type II  . Environmental allergies   . Family history of adverse reaction to anesthesia    sister- PONV  . Fatty liver   . Glaucoma   . Headache(784.0)    HX  MIGRAINES  . History of bronchitis   . History of kidney stones   . History of pneumonia   . Hypertension   . Hypothyroidism   . Obesity   . OSA on CPAP   . Plantar fasciitis    right  . PONV (postoperative nausea and vomiting)   . Renal calculi   . Renal calculi   . RLS (restless legs syndrome) 08/23/2014    Past Surgical History:  Procedure Laterality Date  . ABDOMINAL HYSTERECTOMY     partial  . APPENDECTOMY    . Arthroscopic surgery, knee Left   . BREAST BIOPSY Right    several  . BREAST BIOPSY Right 03/11/2017   u/s bx neg  . BUNIONECTOMY     LEFT   . COLONOSCOPY W/ POLYPECTOMY    . COLONOSCOPY WITH PROPOFOL N/A 08/14/2017   Procedure: COLONOSCOPY WITH PROPOFOL;  Surgeon: Christena Deem, MD;  Location: [facility] ENDOSCOPY;  Service: Endoscopy;  Laterality: N/A;  .  EXTRACORPOREAL SHOCK WAVE LITHOTRIPSY Left 05/14/2017   Procedure: EXTRACORPOREAL SHOCK WAVE LITHOTRIPSY (ESWL);  Surgeon: Riki Altes, MD;  Location: [facility] ORS;  Service: Urology;  Laterality: Left;  . FOOT SURGERY     RIGHT    . KNEE ARTHROSCOPY W/ MENISCAL REPAIR Left   . LASIK    . MAXIMUM ACCESS (MAS)POSTERIOR LUMBAR INTERBODY FUSION (PLIF) 1 LEVEL N/A 04/25/2016   Procedure: LUMBAR FOUR-FIVE  MAXIMUM ACCESS (MAS) POSTERIOR LUMBAR INTERBODY FUSION (PLIF) with extension of instrumentation LUMBAR TWO-FIVE;  Surgeon: Tia Alert, MD;  Location: [facility] OR;  Service: Neurosurgery;  Laterality: N/A;  . MAXIMUM ACCESS (MAS)POSTERIOR LUMBAR INTERBODY FUSION (PLIF) 2 LEVEL N/A 12/13/2015   Procedure: Lumbar two-three - Lumbar three-four MAXIMUM ACCESS (MAS) POSTERIOR LUMBAR INTERBODY FUSION (PLIF)  ;  Surgeon: Tia Alert, MD;  Location: [facility] NEURO ORS;  Service: Neurosurgery;  Laterality: N/A;  . SHOULDER SURGERY     RIGHT   . TONSILLECTOMY      There were no vitals filed for this visit.   Subjective Assessment - 02/27/20 0850    Subjective Back is doing good. Was hurting yesterday. Does not  know if she still has the kidney stone. Used a heating pad yesterday which helped.Still hurts on the R side. 2/10 currently R low back. 10+/10 yesteray. Currently having a living room remodel and only had to use her R shoulder due to L shouldre pain.    Pertinent History Low back pain with B sciatica. Pt also states having a migraine Sunday and Monday. Started getting worse yesteray. Feels better today. Migraine sometimes starts at the back of her head. No mirgraine currently.  Back has been bothering since June 2021. Had nerve problems B LE (sciatic) L > R. Felt like her legs were going to give way. Had 2 injections for her back which helped for a short period. Decided not to go for a 3rd shot. Had to PT for her back for 2x/week for 1-2 months at [facility] which helped a little bit. Was given a HEP which  helped. Not currently doing her HEP. Has a little incontinence which her doctors know about it. Denies LE paresthesia.  Has tingling B plantar sufrace of feet from toes to forefoot. 1 fall in the past 6 months. Pt fell onto the floor in the kitchen while getting a snack in the middle of the night. Pt was sleepy and might have fallen asleep. Does not have a fear of falling.  LE radiating symptoms stopped around September 2021.    Patient Stated Goals Be able to bend over in general such as when cleaning the house and go up and down steps more comfortably.    Currently in Pain? Yes    Pain Score 2     Pain Onset More than a month ago                                     PT Education - 02/27/20 1046    Education provided Yes    Education Details ther-ex    [employer]) Educated Patient    Methods Explanation;Demonstration;Tactile cues;Verbal cues    Comprehension Returned demonstration;Verbalized understanding             Objective  No latex band allergies  Blood pressure, L arm, mechanically taken, regular cuff: 164/78, HR 67 Uncomfortable in S/L position and turning in the mat table both sides Difficulty with sit <>S/L, S/L <>supine secondary to back pain.    Having L shoulder surgery 03/01/2020 (L shoulder replacement)  Medbridge Access Code [email or web address]  Manual therapy In sitting,  STM R >L lumbar paraspinal muscles to decrease tension and fascial restrictions     Therapeutic exercise  Seated B scapular retraction 10x3 to promote thoracic extension    Seated glute max squeeze 10x5 seconds for 3 sets  Seated L hip extension isometrics with PT manual resistance 10x5 seconds for 3 sets to promote R posterior pelvic tilt and decrease stress to R low back   Seated B ankle DF/PF 10x3 to promote LE neural flossing/mobility   Seated manually resisted L trunk lateral shift in neutral isometrics to promote neutral posture 10x3  with 5 second holds    Seated gentle anterior and posterior pelvic tilt 10x3 each side  Seated naval ins gentle 10x5 secondsfor 3 sets   standing glute max squeeze 10x 5 seconds    Improved exercise technique, movement at target joints, use of target muscles after mod verbal, visual, tactile cues.    Response to treatment Fair tolerance to today's session.  Clinical impression Fair tolerance to today's session. Continued working on improving posture, thoracic extension, glute and trunk strength to decrease stress to low back. Pt will benefit from continued skilled physical therapy services to decrease pain, improve strength and function.        PT Short Term Goals - 02/14/20 1341      PT SHORT TERM GOAL #1   Title Pt will be independent with her initial HEP to decrease pain, improve strength, and ability to perform bed mobility more comfortably.    Baseline Pt has started her HEP (02/14/2020)    Time 3    Period Weeks    Status New    Target Date 03/08/20             PT Long Term Goals - 02/14/20 1342      PT LONG TERM GOAL #1   Title Patient will have a decrease in low back pain to 4/10 or less at most to promote ability to perform tasks which involve bending over as well as to be able to negotiate stairs more comfortably.    Baseline 9/10 back pain at most for the past month (02/14/2020)    Time 8    Period Weeks    Status New    Target Date 04/12/20      PT LONG TERM GOAL #2   Title Patient will improve bilateral hip extension and abduction strength by at least 1/2 MMT grade to promote ability to perform standing tasks such as chores as well as negotiate stairs more comfortably.    Baseline hip extension: 4/5 R 3+/5 L, hip abduction 4/5 R and L (02/14/2020)    Time 8    Period Weeks    Status New    Target Date 04/12/20      PT LONG TERM GOAL #3   Title Patient will improve her FOTO score by at least 10 points as a demonstration of improved  function.    Baseline Lumbar FOTO 49 (02/14/2020)    Time 8    Period Weeks    Status New    Target Date 04/12/20                 Plan - 02/27/20 1048    Clinical Impression Statement Fair tolerance to today's session. Continued working on improving posture, thoracic extension, glute and trunk strength to decrease stress to low back. Pt will benefit from continued skilled physical therapy services to decrease pain, improve strength and function.    Personal Factors and Comorbidities Comorbidity 3+;Age;Fitness;Past/Current Experience;Time since onset of injury/illness/exacerbation    Comorbidities Anxiety, DM, depression, hx of kidney stones, HTN, hx of lumbar fusion    Examination-Activity Limitations Sit;Transfers;Sleep;Bed Mobility;Bend;Lift;Squat;Caring for Others;Carry    Stability/Clinical Decision Making Stable/Uncomplicated    Clinical Decision Making Low    Rehab Potential Fair    Clinical Impairments Affecting Rehab Potential Chronicity of condition, home dynamics, pt not performing her HEP    PT Frequency 2x / week    PT Duration 8 weeks    PT Treatment/Interventions Aquatic Therapy;Electrical Stimulation;Iontophoresis 4mg /ml Dexamethasone;Functional mobility training;Therapeutic activities;Therapeutic exercise;Neuromuscular re-education;Patient/family education;Manual techniques;Dry needling    PT Next Visit Plan trunk, hip strengthening, thoracic extension, manual techniques, posture, modalities PRN    Consulted and Agree with Plan of Care Patient           Patient will benefit from skilled therapeutic intervention in order to improve the following deficits and impairments:  Pain, Postural dysfunction, Improper body mechanics,  Decreased strength, Decreased activity tolerance  Visit Diagnosis: Chronic bilateral low back pain, unspecified whether sciatica present  Radiculopathy, lumbar region  Muscle weakness (generalized)  Low back pain, unspecified back pain  laterality, unspecified chronicity, unspecified whether sciatica present  Pain in right hip     Problem List Patient Active Problem List   Diagnosis Date Noted  . 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) 01/03/2019  . Chronic cough 11/19/2016  . Morbid obesity (HCC) 09/26/2016  . Left ovarian cyst 05/30/2016  . S/P lumbar spinal fusion 12/13/2015  . Aortic calcification (HCC) 07/18/2015  . Controlled type 2 diabetes mellitus without complication (HCC) 05/16/2015  . Thrombocytopenia (HCC) 05/16/2015  . Recurrent major depressive disorder, in full remission (HCC) 05/16/2015  . Essential (primary) hypertension 05/16/2015  . Fatty infiltration of liver 03/15/2015  . Aortic valve stenosis, nonrheumatic 01/18/2015  . Sciatica of right side 01/09/2015  . Type 2 diabetes mellitus (HCC) 11/10/2014  . RLS (restless legs syndrome) 08/23/2014  . Neuritis or radiculitis due to rupture of lumbar intervertebral disc 06/13/2014  . Degeneration of intervertebral disc of lumbar region 06/13/2014  . Lumbar radiculitis 06/13/2014  . Arthritis 01/23/2014  . Arthritis of knee, degenerative 11/15/2013  . Depression 09/29/2013  . Insomnia 09/29/2013  . Apnea, sleep 08/29/2013  . History of nephrolithiasis 08/29/2013  . Abnormal presence of protein in urine 08/29/2013  . Headache, migraine 08/29/2013  . BP (high blood pressure) 08/29/2013  . HLD (hyperlipidemia) 08/29/2013  . Allergic rhinitis 08/29/2013  . Intractable migraine without aura 02/18/2013    Loralyn FreshwaterMiguel Heylee Tant PT, DPT   02/27/2020, 10:56 AM  [facility] [address] [place], [place], [address] Phone: [phone]   Fax:  [phone]  Name: Nobie PutnamGail T Arwood MRN: [id number] Date of Birth: 1950/07/29

## 2020-02-28 ENCOUNTER — Ambulatory Visit: Payer: Medicare HMO | Attending: Internal Medicine

## 2020-02-28 ENCOUNTER — Other Ambulatory Visit: Payer: Self-pay | Admitting: Neurology

## 2020-02-28 ENCOUNTER — Other Ambulatory Visit: Payer: Self-pay | Admitting: Internal Medicine

## 2020-02-28 ENCOUNTER — Other Ambulatory Visit
Admission: RE | Admit: 2020-02-28 | Discharge: 2020-02-28 | Disposition: A | Discharge status: Home / Self Care | Payer: Medicare HMO | Source: Ambulatory Visit | Attending: Surgery | Admitting: Surgery

## 2020-02-28 DIAGNOSIS — Z01812 Encounter for preprocedural laboratory examination: Secondary | ICD-10-CM | POA: Insufficient documentation

## 2020-02-28 DIAGNOSIS — Z20822 Contact with and (suspected) exposure to covid-19: Secondary | ICD-10-CM | POA: Diagnosis not present

## 2020-02-28 DIAGNOSIS — Z23 Encounter for immunization: Secondary | ICD-10-CM

## 2020-02-28 LAB — SARS CORONAVIRUS 2 (TAT 6-24 HRS): SARS Coronavirus 2: NEGATIVE

## 2020-02-28 NOTE — Progress Notes (Signed)
   Covid-19 Vaccination Clinic  Name:  LADONYA JERKINS    MRN: [id number] DOB: 04/12/1951  02/28/2020  Ms. Ackerley was observed post Covid-19 immunization for 15 minutes without incident. She was provided with Vaccine Information Sheet and instruction to access the V-Safe system.   Ms. Atayde was instructed to call 911 with any severe reactions post vaccine: Marland Kitchen Difficulty breathing  . Swelling of face and throat  . A fast heartbeat  . A bad rash all over body  . Dizziness and weakness

## 2020-02-29 ENCOUNTER — Ambulatory Visit: Payer: Medicare HMO

## 2020-03-01 ENCOUNTER — Inpatient Hospital Stay: Payer: Medicare HMO

## 2020-03-01 ENCOUNTER — Encounter
Admission: RE | Disposition: A | Discharge status: Home / Self Care | Payer: Self-pay | Source: Home / Self Care | Attending: Surgery

## 2020-03-01 ENCOUNTER — Other Ambulatory Visit: Payer: Self-pay

## 2020-03-01 ENCOUNTER — Inpatient Hospital Stay: Payer: Medicare HMO | Admitting: Urgent Care

## 2020-03-01 ENCOUNTER — Ambulatory Visit
Admission: RE | Admit: 2020-03-01 | Discharge: 2020-03-01 | Disposition: A | Discharge status: Home / Self Care | Payer: Medicare HMO | Attending: Surgery | Admitting: Surgery

## 2020-03-01 ENCOUNTER — Encounter: Payer: Self-pay | Admitting: Surgery

## 2020-03-01 DIAGNOSIS — Z419 Encounter for procedure for purposes other than remedying health state, unspecified: Secondary | ICD-10-CM

## 2020-03-01 DIAGNOSIS — E119 Type 2 diabetes mellitus without complications: Secondary | ICD-10-CM | POA: Insufficient documentation

## 2020-03-01 DIAGNOSIS — M19012 Primary osteoarthritis, left shoulder: Secondary | ICD-10-CM | POA: Insufficient documentation

## 2020-03-01 DIAGNOSIS — Z96612 Presence of left artificial shoulder joint: Secondary | ICD-10-CM

## 2020-03-01 DIAGNOSIS — M75102 Unspecified rotator cuff tear or rupture of left shoulder, not specified as traumatic: Secondary | ICD-10-CM | POA: Insufficient documentation

## 2020-03-01 HISTORY — PX: REVERSE SHOULDER ARTHROPLASTY: SHX5054

## 2020-03-01 LAB — GLUCOSE, CAPILLARY
Glucose-Capillary: 127 mg/dL — ABNORMAL HIGH (ref 70–99)
Glucose-Capillary: 135 mg/dL — ABNORMAL HIGH (ref 70–99)

## 2020-03-01 SURGERY — ARTHROPLASTY, SHOULDER, TOTAL, REVERSE
Anesthesia: General | Site: Shoulder | Laterality: Left

## 2020-03-01 MED ORDER — SODIUM CHLORIDE FLUSH 0.9 % IV SOLN
INTRAVENOUS | Status: AC
  Filled 2020-03-01: qty 40

## 2020-03-01 MED ORDER — CHLORHEXIDINE GLUCONATE 0.12 % MT SOLN: 15.0000 mL | Freq: Once | OROMUCOSAL | Status: AC

## 2020-03-01 MED ORDER — EPINEPHRINE PF 1 MG/ML IJ SOLN
INTRAMUSCULAR | Status: AC
  Filled 2020-03-01: qty 1

## 2020-03-01 MED ORDER — LIDOCAINE HCL (PF) 2 % IJ SOLN
INTRAMUSCULAR | Status: AC
  Filled 2020-03-01: qty 10

## 2020-03-01 MED ORDER — FAMOTIDINE 20 MG PO TABS
ORAL_TABLET | ORAL | Status: AC
  Administered 2020-03-01: 20 mg via ORAL
  Filled 2020-03-01: qty 1

## 2020-03-01 MED ORDER — OXYCODONE HCL 5 MG PO TABS: 5.0000 mg | ORAL_TABLET | ORAL | Status: DC | PRN

## 2020-03-01 MED ORDER — PROPOFOL 10 MG/ML IV BOLUS
INTRAVENOUS | Status: DC | PRN
  Administered 2020-03-01: 80 mg via INTRAVENOUS

## 2020-03-01 MED ORDER — ORAL CARE MOUTH RINSE: 15.0000 mL | Freq: Once | OROMUCOSAL | Status: AC

## 2020-03-01 MED ORDER — TRANEXAMIC ACID 1000 MG/10ML IV SOLN
INTRAVENOUS | Status: AC
  Filled 2020-03-01: qty 10

## 2020-03-01 MED ORDER — BUPIVACAINE LIPOSOME 1.3 % IJ SUSP
INTRAMUSCULAR | Status: AC
  Filled 2020-03-01: qty 20

## 2020-03-01 MED ORDER — METOCLOPRAMIDE HCL 10 MG PO TABS: 5.0000 mg | ORAL_TABLET | Freq: Three times a day (TID) | ORAL | Status: DC | PRN

## 2020-03-01 MED ORDER — BUPIVACAINE HCL (PF) 0.5 % IJ SOLN
INTRAMUSCULAR | Status: AC
  Filled 2020-03-01: qty 10

## 2020-03-01 MED ORDER — CEFAZOLIN SODIUM-DEXTROSE 2-4 GM/100ML-% IV SOLN
INTRAVENOUS | Status: AC
  Filled 2020-03-01: qty 100

## 2020-03-01 MED ORDER — METOCLOPRAMIDE HCL 5 MG/ML IJ SOLN: 5.0000 mg | Freq: Three times a day (TID) | INTRAMUSCULAR | Status: DC | PRN

## 2020-03-01 MED ORDER — FENTANYL CITRATE (PF) 100 MCG/2ML IJ SOLN
INTRAMUSCULAR | Status: AC
  Administered 2020-03-01: 25 ug
  Filled 2020-03-01: qty 2

## 2020-03-01 MED ORDER — CHLORHEXIDINE GLUCONATE 0.12 % MT SOLN
OROMUCOSAL | Status: AC
  Administered 2020-03-01: 15 mL via OROMUCOSAL
  Filled 2020-03-01: qty 15

## 2020-03-01 MED ORDER — ACETAMINOPHEN 500 MG PO TABS
ORAL_TABLET | ORAL | Status: AC
  Filled 2020-03-01: qty 2

## 2020-03-01 MED ORDER — ACETAMINOPHEN 500 MG PO TABS
1000.0000 mg | ORAL_TABLET | Freq: Four times a day (QID) | ORAL | Status: DC
  Administered 2020-03-01: 1000 mg via ORAL

## 2020-03-01 MED ORDER — GUAIFENESIN-CODEINE 100-10 MG/5ML PO SYRP: 5.0000 mL | ORAL_SOLUTION | Freq: Three times a day (TID) | ORAL | 0 refills | Status: DC | PRN

## 2020-03-01 MED ORDER — ONDANSETRON HCL 4 MG/2ML IJ SOLN: 4.0000 mg | Freq: Once | INTRAMUSCULAR | Status: DC | PRN

## 2020-03-01 MED ORDER — HYDROCODONE-ACETAMINOPHEN 7.5-325 MG PO TABS: 1.0000 | ORAL_TABLET | Freq: Once | ORAL | Status: DC | PRN

## 2020-03-01 MED ORDER — FENTANYL CITRATE (PF) 100 MCG/2ML IJ SOLN
INTRAMUSCULAR | Status: AC
  Filled 2020-03-01: qty 2

## 2020-03-01 MED ORDER — SODIUM CHLORIDE 0.9 % IV SOLN
INTRAVENOUS | Status: DC | PRN
  Administered 2020-03-01: 45 ug via INTRAVENOUS

## 2020-03-01 MED ORDER — CEFAZOLIN SODIUM-DEXTROSE 2-4 GM/100ML-% IV SOLN
2.0000 g | INTRAVENOUS | Status: AC
  Administered 2020-03-01: 2 g via INTRAVENOUS

## 2020-03-01 MED ORDER — ONDANSETRON HCL 4 MG/2ML IJ SOLN: 4.0000 mg | Freq: Four times a day (QID) | INTRAMUSCULAR | Status: DC | PRN

## 2020-03-01 MED ORDER — FAMOTIDINE 20 MG PO TABS: 20.0000 mg | ORAL_TABLET | Freq: Once | ORAL | Status: AC

## 2020-03-01 MED ORDER — ONDANSETRON HCL 4 MG/2ML IJ SOLN
INTRAMUSCULAR | Status: DC | PRN
  Administered 2020-03-01: 4 mg via INTRAVENOUS

## 2020-03-01 MED ORDER — PROPOFOL 10 MG/ML IV BOLUS
INTRAVENOUS | Status: AC
  Filled 2020-03-01: qty 20

## 2020-03-01 MED ORDER — KETOROLAC TROMETHAMINE 15 MG/ML IJ SOLN: 7.5000 mg | Freq: Four times a day (QID) | INTRAMUSCULAR | Status: DC

## 2020-03-01 MED ORDER — KETOROLAC TROMETHAMINE 15 MG/ML IJ SOLN
INTRAMUSCULAR | Status: AC
  Administered 2020-03-01: 15 mg via INTRAVENOUS
  Filled 2020-03-01: qty 1

## 2020-03-01 MED ORDER — BUPIVACAINE LIPOSOME 1.3 % IJ SUSP
INTRAMUSCULAR | Status: DC | PRN
  Administered 2020-03-01: 15 mL via PERINEURAL

## 2020-03-01 MED ORDER — ACETAMINOPHEN 325 MG PO TABS: 325.0000 mg | ORAL_TABLET | ORAL | Status: DC | PRN

## 2020-03-01 MED ORDER — GLYCOPYRROLATE 0.2 MG/ML IJ SOLN
INTRAMUSCULAR | Status: DC | PRN
  Administered 2020-03-01: .2 mg via INTRAVENOUS

## 2020-03-01 MED ORDER — FENTANYL CITRATE (PF) 100 MCG/2ML IJ SOLN
INTRAMUSCULAR | Status: DC | PRN
  Administered 2020-03-01: 50 ug via INTRAVENOUS

## 2020-03-01 MED ORDER — LIDOCAINE HCL (CARDIAC) PF 100 MG/5ML IV SOSY
PREFILLED_SYRINGE | INTRAVENOUS | Status: DC | PRN
  Administered 2020-03-01: 100 mg via INTRAVENOUS

## 2020-03-01 MED ORDER — PHENYLEPHRINE HCL (PRESSORS) 10 MG/ML IV SOLN
INTRAVENOUS | Status: DC | PRN
  Administered 2020-03-01 (×5): 200 ug via INTRAVENOUS

## 2020-03-01 MED ORDER — DROPERIDOL 2.5 MG/ML IJ SOLN
0.6250 mg | Freq: Once | INTRAMUSCULAR | Status: DC | PRN
  Filled 2020-03-01: qty 2

## 2020-03-01 MED ORDER — CEFAZOLIN SODIUM-DEXTROSE 2-4 GM/100ML-% IV SOLN: 2.0000 g | Freq: Four times a day (QID) | INTRAVENOUS | Status: DC

## 2020-03-01 MED ORDER — BUPIVACAINE HCL (PF) 0.5 % IJ SOLN
INTRAMUSCULAR | Status: DC | PRN
  Administered 2020-03-01: 5 mL via PERINEURAL

## 2020-03-01 MED ORDER — SODIUM CHLORIDE 0.9 % IV SOLN: INTRAVENOUS | Status: DC

## 2020-03-01 MED ORDER — OXYCODONE HCL 5 MG PO TABS
5.0000 mg | ORAL_TABLET | ORAL | 0 refills | Status: DC | PRN
Start: 2020-03-01 — End: 2020-11-04

## 2020-03-01 MED ORDER — ROCURONIUM BROMIDE 100 MG/10ML IV SOLN
INTRAVENOUS | Status: DC | PRN
  Administered 2020-03-01: 40 mg via INTRAVENOUS

## 2020-03-01 MED ORDER — BUPIVACAINE HCL (PF) 0.5 % IJ SOLN
INTRAMUSCULAR | Status: AC
  Filled 2020-03-01: qty 30

## 2020-03-01 MED ORDER — SUCCINYLCHOLINE CHLORIDE 20 MG/ML IJ SOLN
INTRAMUSCULAR | Status: DC | PRN
  Administered 2020-03-01: 100 mg via INTRAVENOUS

## 2020-03-01 MED ORDER — KETOROLAC TROMETHAMINE 15 MG/ML IJ SOLN: 15.0000 mg | Freq: Once | INTRAMUSCULAR | Status: AC

## 2020-03-01 MED ORDER — ACETAMINOPHEN 160 MG/5ML PO SOLN
325.0000 mg | ORAL | Status: DC | PRN
  Filled 2020-03-01: qty 20.3

## 2020-03-01 MED ORDER — CEFAZOLIN SODIUM-DEXTROSE 2-4 GM/100ML-% IV SOLN
INTRAVENOUS | Status: AC
  Administered 2020-03-01: 2000 mg
  Filled 2020-03-01: qty 100

## 2020-03-01 MED ORDER — ONDANSETRON HCL 4 MG PO TABS: 4.0000 mg | ORAL_TABLET | Freq: Four times a day (QID) | ORAL | Status: DC | PRN

## 2020-03-01 MED ORDER — ROCURONIUM BROMIDE 10 MG/ML (PF) SYRINGE
PREFILLED_SYRINGE | INTRAVENOUS | Status: AC
  Filled 2020-03-01: qty 10

## 2020-03-01 SURGICAL SUPPLY — 74 items
APL PRP STRL LF DISP 70% ISPRP (MISCELLANEOUS) ×2
BASEPLATE BOSS DRILL (MISCELLANEOUS) ×3 IMPLANT
BIT DRILL 2.5 (BIT) ×2
BIT DRILL 2.5X4.5XSCR (BIT) ×1 IMPLANT
BIT DRILL BASEPLATE CENTRAL S (BIT) ×3 IMPLANT
BIT DRL 2.5X4.5XSCR (BIT) ×1
BLADE SAW SAG 25X90X1.19 (BLADE) ×3 IMPLANT
BSPLAT GLND THK2 22.8X27.3 (Plate) ×1 IMPLANT
Baseplate central drill ×3 IMPLANT
CANISTER SUCT 1200ML W/VALVE (MISCELLANEOUS) ×3 IMPLANT
CANISTER SUCT 3000ML PPV (MISCELLANEOUS) ×6 IMPLANT
CHLORAPREP W/TINT 26 (MISCELLANEOUS) ×6 IMPLANT
COOLER POLAR GLACIER W/PUMP (MISCELLANEOUS) ×3 IMPLANT
COVER BACK TABLE REUSABLE LG (DRAPES) ×3 IMPLANT
COVER WAND RF STERILE (DRAPES) ×3 IMPLANT
DRAPE 3/4 80X56 (DRAPES) ×6 IMPLANT
DRAPE IMP U-DRAPE 54X76 (DRAPES) ×6 IMPLANT
DRAPE INCISE IOBAN 66X45 STRL (DRAPES) ×6 IMPLANT
DRILL 2.5MM (BIT) ×1
DRSG OPSITE POSTOP 4X8 (GAUZE/BANDAGES/DRESSINGS) ×3 IMPLANT
ELECT BLADE 6.5 EXT (BLADE) IMPLANT
ELECT CAUTERY BLADE 6.4 (BLADE) ×3 IMPLANT
GLENOSPHERE RSS 2 CONCENTRIC (Shoulder) ×3 IMPLANT
GLOVE BIO SURGEON STRL SZ7.5 (GLOVE) ×12 IMPLANT
GLOVE BIO SURGEON STRL SZ8 (GLOVE) ×15 IMPLANT
GLOVE BIOGEL PI IND STRL 8 (GLOVE) ×1 IMPLANT
GLOVE BIOGEL PI INDICATOR 8 (GLOVE) ×2
GLOVE INDICATOR 8.0 STRL GRN (GLOVE) ×3 IMPLANT
GOWN STRL REUS W/ TWL LRG LVL3 (GOWN DISPOSABLE) ×1 IMPLANT
GOWN STRL REUS W/ TWL XL LVL3 (GOWN DISPOSABLE) ×1 IMPLANT
GOWN STRL REUS W/TWL LRG LVL3 (GOWN DISPOSABLE) ×3
GOWN STRL REUS W/TWL XL LVL3 (GOWN DISPOSABLE) ×3
GUIDE PIN 2.0 S150MM (PIN) ×3 IMPLANT
HOOD PEEL AWAY FLYTE STAYCOOL (MISCELLANEOUS) ×9 IMPLANT
ILLUMINATOR WAVEGUIDE N/F (MISCELLANEOUS) IMPLANT
KIT STABILIZATION SHOULDER (MISCELLANEOUS) ×3 IMPLANT
KIT TURNOVER KIT A (KITS) ×3 IMPLANT
LINER STD +0S RSS HXL (Liner) ×3 IMPLANT
MASK FACE SPIDER DISP (MASK) ×3 IMPLANT
MAT ABSORB  FLUID 56X50 GRAY (MISCELLANEOUS) ×2
MAT ABSORB FLUID 56X50 GRAY (MISCELLANEOUS) ×1 IMPLANT
NDL SAFETY ECLIPSE 18X1.5 (NEEDLE) ×1 IMPLANT
NEEDLE HYPO 18GX1.5 SHARP (NEEDLE) ×3
NEEDLE HYPO 22GX1.5 SAFETY (NEEDLE) ×3 IMPLANT
NEEDLE SPNL 20GX3.5 QUINCKE YW (NEEDLE) ×3 IMPLANT
NS IRRIG 500ML POUR BTL (IV SOLUTION) ×3 IMPLANT
PACK ARTHROSCOPY SHOULDER (MISCELLANEOUS) ×3 IMPLANT
PAD ARMBOARD 7.5X6 YLW CONV (MISCELLANEOUS) ×3 IMPLANT
PAD WRAPON POLAR SHDR UNIV (MISCELLANEOUS) ×1 IMPLANT
PENCIL SMOKE EVACUATOR (MISCELLANEOUS) ×3 IMPLANT
PLATE BASE REVERSE RSS S (Plate) ×3 IMPLANT
PULSAVAC PLUS IRRIG FAN TIP (DISPOSABLE) ×3
SCREW 4.5X15 RSS W CAP (Screw) ×3 IMPLANT
SCREW 4.5X20 RSS W CAP (Screw) ×3 IMPLANT
SCREW 4.5X25 RSS W CAP (Screw) ×3 IMPLANT
SCREW 4.5X30 RSS W CAP (Screw) ×3 IMPLANT
SCREW BODY REVERSE SMALL TITAN (Screw) ×3 IMPLANT
SLING ULTRA II M (MISCELLANEOUS) ×3 IMPLANT
SOL .9 NS 3000ML IRR  AL (IV SOLUTION) ×2
SOL .9 NS 3000ML IRR AL (IV SOLUTION) ×1
SOL .9 NS 3000ML IRR UROMATIC (IV SOLUTION) ×1 IMPLANT
SPONGE LAP 18X18 RF (DISPOSABLE) ×3 IMPLANT
STAPLER SKIN PROX 35W (STAPLE) ×3 IMPLANT
STEM HUM 11 (Stem) ×3 IMPLANT
SUT ETHIBOND 0 MO6 C/R (SUTURE) ×3 IMPLANT
SUT FIBERWIRE #2 38 BLUE 1/2 (SUTURE) ×12
SUT VIC AB 0 CT1 36 (SUTURE) ×3 IMPLANT
SUT VIC AB 2-0 CT1 27 (SUTURE) ×6
SUT VIC AB 2-0 CT1 TAPERPNT 27 (SUTURE) ×2 IMPLANT
SUTURE FIBERWR #2 38 BLUE 1/2 (SUTURE) ×4 IMPLANT
SYR 10ML LL (SYRINGE) ×3 IMPLANT
SYR 30ML LL (SYRINGE) IMPLANT
TIP FAN IRRIG PULSAVAC PLUS (DISPOSABLE) ×1 IMPLANT
WRAPON POLAR PAD SHDR UNIV (MISCELLANEOUS) ×3

## 2020-03-01 NOTE — Op Note (Signed)
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  Post-Op Diagnosis:   Same.  Procedure:   Reverse left total shoulder arthroplasty.  Surgeon:   Maryagnes Amos, MD  Assistant:   Horris Latino, PA-C  Anesthesia:   General endotracheal with an interscalene block using Exparel placed preoperatively by the anesthesiologist.  Findings:   As above.  Complications:   None  EBL:   225 cc  Fluids:   800 cc crystalloid  UOP:   None  TT:   None  Drains:   None  Closure:   Staples  Implants:   All press-fit Integra system with a 11 mm stem, a small metaphyseal body, a +0 mm humeral platform, a mini baseplate, and a 38 mm concentric +2 mm laterally offset glenosphere.  Brief Clinical Note:   The patient is a 69 year old female with a long history of progressively worsening left shoulder pain. Her symptoms have progressed despite medications, activity modification, etc. Her history and examination are consistent with advanced degenerative joint disease. An MRI scan preoperatively demonstrated moderate to severe tendinopathy. The patient presents at this time for a reverse left total shoulder arthroplasty.  Procedure:   The patient underwent placement of an interscalene block using Exparel by the anesthesiologist in the preoperative holding area before being brought into the operating room and lain in the supine position. The patient then underwent general endotracheal intubation and anesthesia before the patient was repositioned in the beach chair position using the beach chair positioner. The left shoulder and upper extremity were prepped with ChloraPrep solution before being draped sterilely. Preoperative antibiotics were administered.   A timeout was performed to verify the appropriate surgical site before a standard anterior approach to the shoulder was made through an approximately  4-5 inch incision. The incision was carried down through the subcutaneous tissues to expose the deltopectoral fascia. The interval between the deltoid and pectoralis muscles was identified and this plane developed, retracting the cephalic vein laterally with the deltoid muscle. The conjoined tendon was identified. Its lateral margin was dissected and the Kolbel self-retraining retractor inserted. The "three sisters" were identified and cauterized. Bursal tissues were removed to improve visualization. The subscapularis tendon was released from its attachment to the lesser tuberosity 1 cm proximal to its insertion and several tagging sutures placed. The inferior capsule was released with care after identifying and protecting the axillary nerve. The proximal humeral cut was made at approximately 20 of retroversion using the extra-medullary guide.   Attention was redirected to the glenoid. The labrum was debrided circumferentially before the center of the glenoid was identified. The guidewire was drilled into the glenoid neck using the appropriate guide. After verifying its position, it was overreamed with the mini-baseplate reamer to create a flat surface before the stem reamer was utilized. The superior and inferior peg sites were reamed using the appropriate guide to complete the glenoid preparation. The permanent mini-baseplate was impacted into place. It was stabilized with a 25 x 4.5 mm central screw and four peripheral screws. Locking caps were placed over the superior and inferior screws. The permanent 38 mm concentric glenosphere with +2 mm of lateral offset was then impacted into place and its Morse taper locking mechanism verified using manual distraction.  Attention was directed to the humeral side. The humeral canal was prepared utilizing the tapered stem reamers sequentially beginning with the 7 mm stem and progressing  to a 11 mm stem. This demonstrated a good tight fit. The metaphyseal region was  then prepared using the appropriate planar device. The trial stem and small metaphyseal body were put together on the back table and a trial reduction performed using the +0 mm insert. With the +0 mm insert, the arm demonstrated excellent range of motion as the hand could be brought across the chest to the opposite shoulder and brought to the top of the patient's head and to the patient's ear. The shoulder appeared stable throughout this range of motion. The joint was dislocated and the trial components removed. The permanent 11 mm stem with the small body was impacted into place with care taken to maintain the appropriate version. A repeat trial reduction with the +0 mm insert again demonstrated excellent stability with the findings as described above. Therefore, the shoulder was re-dislocated and, after inserting the locking screw to secure the body to the stem, the permanent +0 mm insert impacted into place. After verifying its locking mechanism, the shoulder was relocated using two finger pressure and again placed through a range of motion with the findings as described above.  The wound was copiously irrigated with sterile saline solution using the jet lavage system before a total of 30 cc of 0.5% Sensorcaine with epinephrine was injected into the pericapsular and peri-incisional tissues to help with postoperative analgesia. The subscapularis tendon was reapproximated using #2 FiberWire interrupted sutures. The deltopectoral interval was closed using #0 Vicryl interrupted sutures before the subcutaneous tissues were closed using 2-0 Vicryl interrupted sutures. The skin was closed using staples. Prior to closing the skin, 1 g of transexemic acid in 10 cc of normal saline was injected intra-articularly to help with postoperative bleeding. A sterile occlusive dressing was applied to the wound before the arm was placed into a shoulder immobilizer with an abduction pillow. A Polar Care system also was applied to  the shoulder. The patient was then transferred back to a hospital bed before being awakened, extubated, and returned to the recovery room in satisfactory condition after tolerating the procedure well.

## 2020-03-01 NOTE — OR Nursing (Signed)
OT in for evaluation @ 1520.

## 2020-03-01 NOTE — Evaluation (Signed)
Physical Therapy Evaluation Patient Details Name: Jessica Cole MRN: [id number] DOB: [birth year] Today's Date: 03/01/2020   History of Present Illness  69 y/o female s/p L total shoulder replacement 10/28.  Clinical Impression  Pt showed good effort and willingness to participate with all tasks, but generally did not do very well with balance during ambulation/mobility.  She does not typically need an AD and initially we tried to walk w/o one and this was clearly unsafe 2/2 unsteadiness.  She agreed that she needed some AD and PT got a cane, she had increased safety with SPC but ultimately had many stagger steps on multiple bouts of ambulation (40, 100, 10 ft) needing direct PT assist on many of these to maintain standing.  She reports that she feels it is likely residual from surgical/anesthesia meds, BP and other vitals WNL and stable.  Discussed with nurse the need to see her walk with better safety before she is safe to go home, husband is not typically home 24/7 but is planning on being around full time when she first goes home.  Encouraged pt to use AD and to insure someone is with her with all mobility at least initially at home.    Follow Up Recommendations Follow surgeon's recommendation for DC plan and follow-up therapies, assist while up    Equipment Recommendations  None recommended by PT (has SPC. QC, BSC)    Recommendations for Other Services       Precautions / Restrictions Precautions Precautions: Fall Restrictions Weight Bearing Restrictions: Yes LUE Weight Bearing: Non weight bearing      Mobility  Bed Mobility               General bed mobility comments: in recliner on arrival    Transfers Overall transfer level: Modified independent Equipment used: Straight cane             General transfer comment: Pt was able to rise to standing on at least 5 seperate occasions and did not need direct phyiscal assist to attain upright, however with most attempts  she showed at least some unsteadiness and generally did not display a lot of safety or confidence even with cane  Ambulation/Gait Ambulation/Gait assistance: Min assist Gait Distance (Feet): 100 Feet Assistive device: Straight cane       General Gait Details: took an initial few steps w/o AD and pt was unsteady and we returned to sitting.  Proceeded with 40, 100 and 10 ft walks all with varying degrees of unsteadiness and need for intermittent assist.  She, had numerous stagger steps, many of which did require some phyiscal assist to maintain standing.  Pt reports that this is likely residual from surgery and that this is not normal for her. She does not use AD at home and clearly was reliant on it this session.  She showed good effort and would be able to go a few feet with consistent and steady cadence but consistently had stagger steps and unsteadiness that we unsafe.    Stairs Stairs: Yes Stairs assistance: Min assist Stair Management: No rails;Forwards;With cane Number of Stairs: 2 General stair comments: Pt needing min assist to keep balance on steps, wanting to reach for rail (does not have rail at home) despite cues to use cane appropriately.   Wheelchair Mobility    Modified Rankin (Stroke Patients Only)       Balance Overall balance assessment: Needs assistance Sitting-balance support: No upper extremity supported Sitting balance-Leahy Scale: Good Sitting balance - Comments:  no issues with sitting balance   Standing balance support: Single extremity supported Standing balance-Leahy Scale: Poor Standing balance comment: Statically pt had some mild unsteadiness, but during ambulation she consistently had small to medicum LOB that required PT intervention to maintain standing.  Overall she showed good determination but even with cane had poor safety with dynamic standing tasks.                             Pertinent Vitals/Pain Pain Assessment: No/denies pain  (minimal chronic back pain)    Home Living Family/patient expects to be discharged to:: Private residence Living Arrangements: Spouse/significant other Available Help at Discharge: Family;Available 24 hours/day Type of Home: House Home Access: Stairs to enter Entrance Stairs-Rails: None (posts at landing) Secretary/administrator of Steps: 1 Home Layout: One level Home Equipment: Environmental consultant - 2 wheels;Cane - quad;Cane - single point;Bedside commode      Prior Function Level of Independence: Independent         Comments: Per pt report she was able to ambulate w/o AD, drive, be out of the home and generally stay active     Hand Dominance        Extremity/Trunk Assessment   Upper Extremity Assessment Upper Extremity Assessment: Overall WFL for tasks assessed (L in immob, not tested, does have some hand/finger movement)    Lower Extremity Assessment Lower Extremity Assessment: Overall WFL for tasks assessed       Communication   Communication: No difficulties  Cognition Arousal/Alertness: Awake/alert Behavior During Therapy: WFL for tasks assessed/performed Overall Cognitive Status: Within Functional Limits for tasks assessed                                        General Comments      Exercises Other Exercises Other Exercises: issued HEP and reviewed hand/wrist/elbow/posture HEP, discussed progression of PT post sx   Assessment/Plan    PT Assessment Patient needs continued PT services  PT Problem List Decreased strength;Decreased range of motion;Decreased activity tolerance;Decreased balance;Decreased mobility;Decreased coordination;Decreased knowledge of use of DME;Decreased safety awareness;Decreased knowledge of precautions       PT Treatment Interventions Gait training;Stair training;DME instruction;Functional mobility training;Therapeutic activities;Therapeutic exercise;Balance training;Patient/family education    PT Goals (Current goals can  be found in the Care Plan section)  Acute Rehab PT Goals Patient Stated Goal: go home today PT Goal Formulation: With patient/family Time For Goal Achievement: 03/15/20 Potential to Achieve Goals: Good    Frequency BID   Barriers to discharge        Co-evaluation               AM-PAC PT "6 Clicks" Mobility  Outcome Measure Help needed turning from your back to your side while in a flat bed without using bedrails?: A Little Help needed moving from lying on your back to sitting on the side of a flat bed without using bedrails?: A Little Help needed moving to and from a bed to a chair (including a wheelchair)?: A Little Help needed standing up from a chair using your arms (e.g., wheelchair or bedside chair)?: A Little Help needed to walk in hospital room?: A Lot Help needed climbing 3-5 steps with a railing? : A Lot 6 Click Score: 16    End of Session Equipment Utilized During Treatment: Gait belt Activity Tolerance: Patient tolerated treatment well  Patient left: in chair;with call bell/phone within reach;with nursing/sitter in room;with family/visitor present Nurse Communication: Mobility status (need to insure steadiness) PT Visit Diagnosis: Unsteadiness on feet (R26.81);Difficulty in walking, not elsewhere classified (R26.2)    Time: 6720-9470 PT Time Calculation (min) (ACUTE ONLY): 40 min   Charges:   PT Evaluation $PT Eval Low Complexity: 1 Low PT Treatments $Gait Training: 8-22 mins $Therapeutic Exercise: 8-22 mins        Malachi Pro, DPT 03/01/2020, 3:59 PM

## 2020-03-01 NOTE — Evaluation (Signed)
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 Now s/p reverse L total shoulder replacement 10/28.   Clinical Impression   Patient was seen for an OT evaluation this date. Pt lives with spouse in [facility] with 1 STE. Prior to surgery, pt was active and independent. Pt has orders for L UE to be immobilized and will be NWBing per MD. Patient presents with impaired strength/ROM, pain, and sensation to L UE with block not completely resolved yet. These impairments result in a decreased ability to perform self care tasks requiring MOD assist for UB/LB dressing and bathing and MAX assist for application of polar care, compression stockings, and sling/immobilizer. Pt and spouse instructed in polar care mgt, compression stockings mgt, sling/immobilizer mgt, ROM exercises for L UE elbow/wrist/hand (with instructions to wait until full sensation has returned), L UE precautions, adaptive strategies for bathing/dressing/toileting/grooming, positioning and considerations for sleep, and home/routines modifications to maximize falls prevention, safety, and independence. Handout provided. OT adjusted sling/immobilizer and polar care to improve comfort, optimize positioning, and to maximize skin integrity/safety. Pt verbalized understanding of all education/training provided. Pt will benefit from skilled OT services to address these limitations and improve independence in daily tasks. Recommend HHOT services to continue therapy to maximize return to PLOF, address home/routines modifications and safety, minimize falls risk, and minimize caregiver burden.      Follow Up Recommendations  Home health OT;Follow surgeon's recommendation for DC plan and follow-up therapies    Equipment Recommendations  Tub/shower  seat    Recommendations for Other Services       Precautions / Restrictions Precautions Precautions: Fall Required Braces or Orthoses: Sling (with abdominal pillow) Restrictions Weight Bearing Restrictions: Yes LUE Weight Bearing: Non weight bearing      Mobility Bed Mobility               General bed mobility comments: in recliner on arrival    Transfers Overall transfer level: Needs assistance Equipment used: Straight cane Transfers: Sit to/from Stand Sit to Stand: Min guard         General transfer comment: CGA provided initially as pt is drowsy and somewhat unsteady, subsequent trials completed with SUPV only, but pt continues to demo some slight unsteadiness.    Balance Overall balance assessment: Needs assistance Sitting-balance support: No upper extremity supported Sitting balance-Leahy Scale: Good Sitting balance - Comments: no issues with sitting balance   Standing balance support: Single extremity supported Standing balance-Leahy Scale: Poor Standing balance comment: pt with mild sway in static stand even with CGA-hand held assist or use of her personal staight cane.                           ADL either performed or assessed with clinical judgement   ADL Overall ADL's : Needs assistance/impaired                                       General ADL Comments: MOD A with UB/LB dressing, MAX A for polar care/sling mgt. CGA for safety with sit<>stand d/t some drowsiness.     Vision Baseline Vision/History: Wears glasses Wears Glasses: At all times Patient Visual Report: No change from baseline  Perception     Praxis      Pertinent Vitals/Pain Pain Assessment: No/denies pain (vauge c/o chronic back pain, no current pain in op site, reporting she is still with some numbness/decreased sensation.)     Hand Dominance Right   Extremity/Trunk Assessment Upper Extremity Assessment Upper Extremity Assessment: RUE  deficits/detail;LUE deficits/detail RUE Deficits / Details: WFL, h/o bone spur removal in shoulder. grip MMT grossly 4/5 LUE Deficits / Details: pt reporting she is still experiencing some numbness/decreased sensation post op'ly, but reports that her fingers are getting sensation and mobility back, still waiting on thumb/elbow/upper arm. LUE: Unable to fully assess due to immobilization LUE Sensation: decreased light touch   Lower Extremity Assessment Lower Extremity Assessment: Defer to PT evaluation;Overall WFL for tasks assessed       Communication Communication Communication: No difficulties   Cognition Arousal/Alertness: Awake/alert Behavior During Therapy: WFL for tasks assessed/performed Overall Cognitive Status: Within Functional Limits for tasks assessed                                 General Comments: Pt somewhat drowsy throughout session, but demos good attention when cued. She is appropriate conversationally and with commands. Spouse is present for education retention as pt is still recovering from anesthesia.   General Comments       Exercises Exercises: Other exercises Other Exercises Other Exercises: OT facilitates ed re: post op considerations for L UE dressing/bathing, sling and polar care mgt, compression stocking mgt, safety, sleep positioning considerations. Pt and spouse with good uncerstanding. Handout provided to ensure carryover.   Shoulder Instructions      Home Living Family/patient expects to be discharged to:: Private residence Living Arrangements: Spouse/significant other Available Help at Discharge: Family;Available 24 hours/day Type of Home: House Home Access: Stairs to enter [employer] of Steps: 1 Entrance Stairs-Rails: None Home Layout: One level     Bathroom Shower/Tub: Chief Strategy Officer: Standard     Home Equipment: Environmental consultant - 2 wheels;Cane - quad;Cane - single point;Bedside commode           Prior Functioning/Environment Level of Independence: Independent        Comments: Per pt report she was able to ambulate w/o AD, drive, be out of the home and generally stay active        OT Problem List: Decreased strength;Decreased range of motion;Decreased coordination;Impaired sensation;Impaired UE functional use      OT Treatment/Interventions: Self-care/ADL training;Therapeutic activities;Therapeutic exercise;Patient/family education    OT Goals(Current goals can be found in the care plan section) Acute Rehab OT Goals Patient Stated Goal: go home today OT Goal Formulation: All assessment and education complete, DC therapy  OT Frequency:     Barriers to D/C:            Co-evaluation              AM-PAC OT "6 Clicks" Daily Activity     Outcome Measure Help from another person eating meals?: None Help from another person taking care of personal grooming?: A Little Help from another person toileting, which includes using toliet, bedpan, or urinal?: A Little Help from another person bathing (including washing, rinsing, drying)?: A Lot Help from another person to put on and taking off regular upper body clothing?: A Lot Help from another person to put on and taking off regular lower body clothing?: A Lot 6 Click Score: 16   End  of Session Equipment Utilized During Treatment: Other (comment) (sling and cane) Nurse Communication: Other (comment) (notified that pt still somewhat drowsy and unsteady in standing.)  Activity Tolerance: Patient tolerated treatment well Patient left: in chair;with family/visitor present (nurse presenting to d/c pt)  OT Visit Diagnosis: Muscle weakness (generalized) (M62.81)                Time: 3007-6226 OT Time Calculation (min): 48 min Charges:  OT General Charges $OT Visit: 1 Visit OT Evaluation $OT Eval Moderate Complexity: 1 Mod OT Treatments $Self Care/Home Management : 23-37 mins  Rejeana Brock, MS, OTR/L ascom  [phone] 03/01/20, 4:38 PM

## 2020-03-01 NOTE — H&P (Signed)
History of Present Illness:  Jessica Cole is a 69 y.o. 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 Since her last visit, the patient continues to experience moderate pain in her shoulder, especially with any activities at or above shoulder level. She has been applying Voltaren gel as necessary with limited benefit. Overall, she feels her symptoms are gradually worsened since her last visit 2 months ago. She is having difficulty sleeping at night and even performing simple daily activities such as getting dressed or performing personal hygiene. Since her last visit, she has undergone an MRI scan and presents today to review these results.  Current Outpatient Medications: . albuterol 90 mcg/actuation inhaler Inhale 2 inhalations into the lungs every 6 (six) hours as needed for Wheezing  . aspirin 81 MG EC tablet Take 81 mg by mouth 3 (three) times a week  . azelastine (ASTELIN) 137 mcg nasal spray Place 1 spray into both nostrils 2 (two) times daily Uses at night  . blood glucose diagnostic test strip Use 2 (two) times daily Use as instructed. 200 each 3  . diclofenac (VOLTAREN) 1 % topical gel Apply 2 g topically 2 (two) times daily as needed.  . DULoxetine (CYMBALTA) 60 MG DR capsule Take 1 capsule (60 mg total) by mouth once daily 90 capsule 3  . fluticasone propionate (FLONASE) 50 mcg/actuation nasal spray USE ONE SPRAY IN EACH NOSTRIL EVERY DAY 16 g 5  . gabapentin (NEURONTIN) 800 MG tablet Take 800 mg by mouth once daily  . lancets Use 1 each 2 (two) times daily Use as instructed. 200 each 3  . latanoprost (XALATAN) 0.005 % ophthalmic solution (Patient taking differently: Place 1 drop into both eyes nightly.)  . levothyroxine (SYNTHROID) 112 MCG tablet TAKE 1 TABLET BY MOUTH ON EMPTY STOMACH AT LEAST 30-60 MINS BEFORE BREAKFAST WITH A GLASS OF WATER 90 tablet 1  . meloxicam (MOBIC) 7.5 MG tablet TAKE 1 TABLET  BY MOUTH EVERY DAY 30 tablet 3  . metFORMIN (GLUCOPHAGE) 1000 MG tablet Take 1,000 mg by mouth 2 (two) times daily with meals  . metoprolol succinate (TOPROL-XL) 50 MG XL tablet TAKE 1 TABLET (50 MG TOTAL) BY MOUTH DAILY. TAKE WITH OR IMMEDIATELY FOLLOWING A MEAL.  Marland Kitchen oxybutynin (DITROPAN) 5 mg tablet TAKE 1 TABLET BY MOUTH THREE TIMES A DAY 270 tablet 1  . pravastatin (PRAVACHOL) 10 MG tablet Take 1 tablet (10 mg total) by mouth once a week 12 tablet 3  . rOPINIRole (REQUIP) 4 MG tablet Take 0.5 tablets (2 mg total) by mouth nightly 1 tab at night 90 tablet 1  . telmisartan (MICARDIS) 80 MG tablet Take 1 tablet (80 mg total) by mouth once daily 30 tablet 11  . traMADoL (ULTRAM) 50 mg tablet TAKE 1/2-1 TABLET BY MOUTH 2 TIMES A DAY AS NEEDED 60 tablet 1  . UNABLE TO FIND Hair skin and nail vitamin Take by mouth daily   No current Epic-ordered facility-administered medications on file.   Allergies:  . Dexamethasone Other (See Comments)  Weakness, dropped bp, "felt out of it"  . Diazepam Hallucination   Past Medical History:  . Allergic state  . Anxiety  . Arthritis  . Depression  Anxiety  . Diabetes mellitus type 2, uncomplicated (CMS-HCC)  . Fatty liver  by U/S 2013; hep B/C negative  . Hyperlipidemia  . Hypertension  . Hypothyroid, unspecified  . Migraine  maxalt, imitrex ineffective; evaluated by  Neurology  . Neuropathy  . Nonrheumatic aortic valve stenosis 01/18/2015  Borderline by echo 9/16; periodic monitoring recommended  . Pulmonary nodules  . Renal stones  followed by Dr. Evelene Croon  . Sleep apnea  on CPAP; followed by Pulmonology  . Thrombocytopenia (CMS-HCC) 05/16/2015   Past Surgical History:  . APPENDECTOMY  . COLONOSCOPY 03/26/2012  adenomatous, hyperplastic polyps  . COLONOSCOPY 08/14/2017  2 hyperplastic polyps,, diverticulosis,internal hemorrhoids/PHx CP/Repeat in 5 years/ [facility]  . Foot & Bunion Surgery 04/29/2012  Dr. Arbutus Ped  . History of breast biopsy  09/22/1998, 12/28/2012  Rt Breast; Dr. Evette Cristal  . HYSTERECTOMY 1981  . KNEE ARTHROSCOPY Left 06/24/2013  LEFT KNEE ARTHROSCOPY, PARTIAL MEDIAL MENISECTOMY AND CHONDROPLASTY  . Laser eye surgery Left  . LEFT FOOT BUNIONECTOMY 2004  Dr. Erin Sons  . LITHOTRIPSY  . MUSCLE BIOPSY  . PERCUTANEOUS SPINAL FUSION 12/2015  . Right breast cystectomy 01/04/2004  Dr. Evette Cristal  . RIGHT SUBACROMIAL DECOMPRESSION AND ROTATOR CUFF REPAIR 06/08/2009  Dr. Francesco Sor  . SPINE SURGERY  . TONSILLECTOMY   Family History:  . High blood pressure (Hypertension) Mother  . Lung cancer Mother  . Cancer Mother  . Coronary Artery Disease (Blocked arteries around heart) Father  . High blood pressure (Hypertension) Father   Social History:   Socioeconomic History:  Marland Kitchen Marital status: Married  Spouse name: Particia Nearing "Dickie" Nedra Hai  . Number of children: 0  . Years of education: 33  . Highest education level: Not on file  Occupational History  . Occupation: Retired  Tobacco Use  . Smoking status: Never Smoker  . Smokeless tobacco: Never Used  Vaping Use  . Vaping Use: Never used  Substance and Sexual Activity  . Alcohol use: No  Alcohol/week: 0.0 standard drinks  . Drug use: No  . Sexual activity: Yes  Partners: Male  Other Topics Concern  . Not on file  Social History Narrative  . Not on file   Social Determinants of Health:   Financial Resource Strain:  . Difficulty of Paying Living Expenses:  Food Insecurity:  . Worried About Programme researcher, broadcasting/film/video in the Last Year:  . Barista in the Last Year:  Transportation Needs:  . Freight forwarder (Medical):  Marland Kitchen Lack of Transportation (Non-Medical):   Review of Systems:  A comprehensive 14 point ROS was performed, reviewed, and the pertinent orthopaedic findings are documented in the HPI.  Physical Exam: Vitals:  02/17/20 0839  BP: (!) 140/90  Weight: 86.6 kg (191 lb)  Height: 149.9 cm (4\' 11" )  PainSc: 1  PainLoc: Shoulder    General/Constitutional: Pleasant overweight elderly female in no acute distress. Neuro/Psych: Normal mood and affect, oriented to person, place and time. Eyes: Non-icteric. Pupils are equal, round, and reactive to light, and exhibit synchronous movement. ENT: Unremarkable. Lymphatic: No palpable adenopathy. Respiratory: Lungs clear to auscultation, Normal chest excursion, No wheezes and Non-labored breathing Cardiovascular: Regular rate and rhythm. No murmurs. and No edema, swelling or tenderness, except as noted in detailed exam. Integumentary: No impressive skin lesions present, except as noted in detailed exam. Musculoskeletal: Unremarkable, except as noted in detailed exam.  Left shoulder exam: SKIN: Normal SWELLING: None WARMTH: None LYMPH NODES: No adenopathy palpable CREPITUS: None TENDERNESS: Minimally tender over anterolateral shoulder ROM (active):  Forward flexion: 80 degrees Abduction: 60 degrees Internal rotation: Left buttock ROM (passive):  Forward flexion: 120 degrees Abduction: 105 degrees  ER/IR at 90 abd: 60 degrees/30 degrees  She describes moderate pain  with all motions.  STRENGTH: Forward flexion: 4/5 Abduction: 4/5 External rotation: 3+-4/5 Internal rotation: 4-4+/5 Pain with RC testing: Moderate pain with resisted external rotation, and mild pain with resisted forward flexion and abduction  STABILITY: Normal  SPECIAL TESTS: Juanetta Gosling' test: Moderately positive Speed's test: Not evaluated Capsulitis - pain w/ passive ER: No Crossed arm test: Mild-moderately positive Crank: Not evaluated Anterior apprehension: Negative Posterior apprehension: Not evaluated  She remains neurovascularly intact to the left upper extremity.  X-rays/MRI/Lab data:  A recent MRI scan of the left shoulder is available for review. By report, the scan demonstrates evidence of advanced generative joint disease of the glenohumeral joint with "marked thickening and  heterogeneously increased T2 signal in the rotator cuff tendons consistent with tendinopathy. There is some fissuring of the distal supraspinatus and infraspinatus." Both the films and report were reviewed by myself and discussed with the patient.  Assessment: Primary osteoarthritis of left shoulder.   Plan: The treatment options were discussed with the patient. In addition, patient educational materials were provided regarding the diagnosis and treatment options. The patient is quite frustrated by her symptoms and functional limitations, and is ready to consider more aggressive treatment options. Therefore, I have recommended a surgical procedure, specifically a reverse left total shoulder arthroplasty. The procedure was discussed with the patient, as were the potential risks (including bleeding, infection, nerve and/or blood Cole injury, persistent or recurrent pain, loosening and/or failure of the components, dislocation, need for further surgery, blood clots, strokes, heart attacks and/or arhythmias, pneumonia, etc.) and benefits. The patient states his/her understanding and wishes to proceed. All of the patient's questions and concerns were answered. She can call any time with further concerns. She will follow up post-surgery, routine. The patient also has been fitted with a Slingshot shoulder immobilizer in preparation for her postoperative recuperation.   H&P reviewed and patient re-examined. No changes.

## 2020-03-01 NOTE — Transfer of Care (Signed)
Immediate Anesthesia Transfer of Care Note  Patient: Jessica Cole  Procedure(s) Performed: REVERSE SHOULDER ARTHROPLASTY (Left Shoulder)  Patient Location: PACU  Anesthesia Type:General  Level of Consciousness: awake and alert   Airway & Oxygen Therapy: Patient Spontanous Breathing and Patient connected to nasal cannula oxygen  Post-op Assessment: Report given to RN and Post -op Vital signs reviewed and stable  Post vital signs: Reviewed and stable  Last Vitals:  Vitals Value Taken Time  BP 95/52 03/01/20 1030  Temp    Pulse 77 03/01/20 1033  Resp 16 03/01/20 1033  SpO2 100 % 03/01/20 1033  Vitals shown include unvalidated device data.  Last Pain:  Vitals:   03/01/20 1030  TempSrc:   PainSc: (P) Asleep         Complications: No complications documented.

## 2020-03-01 NOTE — Discharge Instructions (Addendum)
Orthopedic discharge instructions: May shower with intact op-site dressing. Apply ice frequently to shoulder or use Polar Care device. Take ibuprofen 600-800 mg TID with meals for 7-10 days, then as necessary.   TID = 3 times per day (every 8 hours) Take oxycodone as prescribed when needed.  May supplement with ES Tylenol if necessary. Keep shoulder immobilizer on at all times except may remove for bathing purposes. Follow-up in 10-14 days or as scheduled.      Interscalene Nerve Block with Exparel  1.  For your surgery you have received an Interscalene Nerve Block with Exparel. 2. Nerve Blocks affect many types of nerves, including nerves that control movement, pain and normal sensation.  You may experience feelings such as numbness, tingling, heaviness, weakness or the inability to move your arm or the feeling or sensation that your arm has "fallen asleep". 3. A nerve block with Exparel can last up to 5 days.  Usually the weakness wears off first.  The tingling and heaviness usually wear off next.  Finally you may start to notice pain.  Keep in mind that this may occur in any order.  Once a nerve block starts to wear off it is usually completely gone within 60 minutes. 4. ISNB may cause mild shortness of breath, a hoarse voice, blurry vision, unequal pupils, or drooping of the face on the same side as the nerve block.  These symptoms will usually resolve with the numbness.  Very rarely the procedure itself can cause mild seizures. 5. If needed, your surgeon will give you a prescription for pain medication.  It will take about 60 minutes for the oral pain medication to become fully effective.  So, it is recommended that you start taking this medication before the nerve block first begins to wear off, or when you first begin to feel discomfort. 6. Take your pain medication only as prescribed.  Pain medication can cause sedation and decrease your breathing if you take more than you need for the level  of pain that you have. 7. Nausea is a common side effect of many pain medications.  You may want to eat something before taking your pain medicine to prevent nausea. 8. After an Interscalene nerve block, you cannot feel pain, pressure or extremes in temperature in the effected arm.  Because your arm is numb it is at an increased risk for injury.  To decrease the possibility of injury, please practice the following:  a. While you are awake change the position of your arm frequently to prevent too much pressure on any one area for prolonged periods of time. b.  If you have a cast or tight dressing, check the color or your fingers every couple of hours.  Call your surgeon with the appearance of any discoloration (white or blue). c. If you are given a sling to wear before you go home, please wear it  at all times until the block has completely worn off.  Do not get up at night without your sling. d. Please contact [facility] or your surgeon if you do not begin to regain sensation after 7 days from the surgery.  Anesthesia may be contacted by calling the Same Day Surgery Department, Mon. through Fri., 6 am to 4 pm at [phone].   e. If you experience any other problems or concerns, please contact your surgeon's office. f. If you experience severe or prolonged shortness of breath go to the nearest emergency department.   AMBULATORY SURGERY  DISCHARGE INSTRUCTIONS  1) The drugs that you were given will stay in your system until tomorrow so for the next 24 hours you should not:  A) Drive an automobile B) Make any legal decisions C) Drink any alcoholic beverage   2) You may resume regular meals tomorrow.  Today it is better to start with liquids and gradually work up to solid foods.  You may eat anything you prefer, but it is better to start with liquids, then soup and crackers, and gradually work up to solid foods.   3) Please notify your doctor immediately if you have any unusual  bleeding, trouble breathing, redness and pain at the surgery site, drainage, fever, or pain not relieved by medication.    4) Additional Instructions:        Please contact your physician with any problems or Same Day Surgery at [phone], Monday through Friday 6 am to 4 pm, or [place] [facility] number at [phone].

## 2020-03-01 NOTE — Anesthesia Preprocedure Evaluation (Signed)
Anesthesia Evaluation  Patient identified by MRN, date of birth, ID band Patient awake    Reviewed: Allergy & Precautions, H&P , NPO status , reviewed documented beta blocker date and time   History of Anesthesia Complications (+) PONV and Family history of anesthesia reaction  Airway Mallampati: III  TM Distance: <3 FB Neck ROM: limited    Dental  (+) Chipped   Pulmonary asthma , sleep apnea and Continuous Positive Airway Pressure Ventilation ,    Pulmonary exam normal        Cardiovascular hypertension, Normal cardiovascular exam     Neuro/Psych  Headaches, PSYCHIATRIC DISORDERS Anxiety Depression  Neuromuscular disease    GI/Hepatic   Endo/Other  diabetesHypothyroidism   Renal/GU Renal disease     Musculoskeletal  (+) Arthritis ,   Abdominal   Peds  Hematology   Anesthesia Other Findings Past Medical History: No date: Anxiety No date: Asthma     Comment:  seasonal with allergies No date: Colon polyps No date: Degenerative arthritis No date: Depression No date: Diabetes mellitus without complication (HCC)     Comment:  Type II No date: Environmental allergies No date: Family history of adverse reaction to anesthesia     Comment:  sister- PONV No date: Fatty liver No date: Glaucoma No date: Headache(784.0)     Comment:  HX  MIGRAINES No date: History of bronchitis No date: History of kidney stones No date: History of pneumonia No date: Hypertension No date: Hypothyroidism No date: Obesity No date: OSA on CPAP No date: Plantar fasciitis     Comment:  right No date: PONV (postoperative nausea and vomiting) No date: Renal calculi No date: Renal calculi 08/23/2014: RLS (restless legs syndrome) Past Surgical History: No date: ABDOMINAL HYSTERECTOMY     Comment:  partial No date: APPENDECTOMY No date: Arthroscopic surgery, knee; Left No date: BREAST BIOPSY; Right     Comment:  several 03/11/2017:  BREAST BIOPSY; Right     Comment:  u/s bx neg No date: BUNIONECTOMY     Comment:  LEFT  No date: COLONOSCOPY W/ POLYPECTOMY 08/14/2017: COLONOSCOPY WITH PROPOFOL; N/A     Comment:  Procedure: COLONOSCOPY WITH PROPOFOL;  Surgeon:               Christena Deem, MD;  Location: [facility] ENDOSCOPY;                Service: Endoscopy;  Laterality: N/A; 05/14/2017: EXTRACORPOREAL SHOCK WAVE LITHOTRIPSY; Left     Comment:  Procedure: EXTRACORPOREAL SHOCK WAVE LITHOTRIPSY (ESWL);              Surgeon: Riki Altes, MD;  Location: [facility] ORS;                Service: Urology;  Laterality: Left; No date: FOOT SURGERY     Comment:  RIGHT   No date: KNEE ARTHROSCOPY W/ MENISCAL REPAIR; Left No date: LASIK 04/25/2016: MAXIMUM ACCESS (MAS)POSTERIOR LUMBAR INTERBODY FUSION  (PLIF) 1 LEVEL; N/A     Comment:  Procedure: LUMBAR FOUR-FIVE  MAXIMUM ACCESS (MAS)               POSTERIOR LUMBAR INTERBODY FUSION (PLIF) with extension               of instrumentation LUMBAR TWO-FIVE;  Surgeon: Tia Alert, MD;  Location: [facility] OR;  Service: Neurosurgery;  Laterality: N/A; 12/13/2015: MAXIMUM ACCESS (MAS)POSTERIOR LUMBAR INTERBODY FUSION  (PLIF) 2 LEVEL; N/A     Comment:  Procedure: Lumbar two-three - Lumbar three-four MAXIMUM               ACCESS (MAS) POSTERIOR LUMBAR INTERBODY FUSION (PLIF)  ;               Surgeon: Tia Alert, MD;  Location: [facility] NEURO ORS;                Service: Neurosurgery;  Laterality: N/A; No date: SHOULDER SURGERY     Comment:  RIGHT  No date: TONSILLECTOMY BMI    Body Mass Index: 38.58 kg/m     Reproductive/Obstetrics                             Anesthesia Physical Anesthesia Plan  ASA: III  Anesthesia Plan: General   Post-op Pain Management:  Regional for Post-op pain   Induction: Intravenous  PONV Risk Score and Plan: Ondansetron, Midazolam, Diphenhydramine, Dexamethasone and Treatment may vary due to age or medical  condition  Airway Management Planned: Oral ETT  Additional Equipment:   Intra-op Plan:   Post-operative Plan: Extubation in OR  Informed Consent: I have reviewed the patients History and Physical, chart, labs and discussed the procedure including the risks, benefits and alternatives for the proposed anesthesia with the patient or authorized representative who has indicated his/her understanding and acceptance.     Dental Advisory Given  Plan Discussed with: CRNA  Anesthesia Plan Comments:         Anesthesia Quick Evaluation

## 2020-03-01 NOTE — Anesthesia Procedure Notes (Signed)
Anesthesia Regional Block: Interscalene brachial plexus block   Pre-Anesthetic Checklist: ,, timeout performed, Correct Patient, Correct Site, Correct Laterality, Correct Procedure, Correct Position, site marked, Risks and benefits discussed,  Surgical consent,  Pre-op evaluation,  At surgeon's request and post-op pain management  Laterality: Upper and Left  Prep: chloraprep       Needles:  Injection technique: Single-shot  Needle Type: Echogenic Stimulator Needle     Needle Length: 10cm  Needle Gauge: 21   Needle insertion depth: 6 cm   Additional Needles:   Procedures: Doppler guided,,,, ultrasound used (permanent image in chart),,,,  Narrative:  Start time: 03/01/2020 7:33 AM End time: 03/01/2020 7:37 AM Injection made incrementally with aspirations every 5 mL.  Events:,,,,,,,,,,, other event  Performed by: Personally  Anesthesiologist: Christia Reading, MD  Additional Notes: Functioning IV was confirmed and O2 [place]/monitors were applied. Light sedation administered as required, patient responsive throughout. A 21ga EchoStim needle was used. Sterile prep and drape,hand hygiene and sterile gloves were used.  Negative aspiration and negative test dose prior to incremental administration of local anesthetic. 1% Lidocaine for skin wheal, 4 ml. Total LA: 32ml - Exparel 52ml & 0.5% Bupivicaine 33ml. U/S images stored in chart. The patient tolerated the procedure well.

## 2020-03-01 NOTE — Anesthesia Postprocedure Evaluation (Signed)
Anesthesia Post Note  Patient: Jessica Cole  Procedure(s) Performed: REVERSE SHOULDER ARTHROPLASTY (Left Shoulder)  Patient location during evaluation: PACU Anesthesia Type: General and Regional Level of consciousness: awake and alert Pain management: pain level controlled Vital Signs Assessment: post-procedure vital signs reviewed and stable Respiratory status: spontaneous breathing, nonlabored ventilation, respiratory function stable and patient connected to nasal cannula oxygen Cardiovascular status: blood pressure returned to baseline and stable (BP near admission baseline) Postop Assessment: no apparent nausea or vomiting Anesthetic complications: no   No complications documented.   Last Vitals:  Vitals:   03/01/20 1306 03/01/20 1411  BP: 137/70 124/66  Pulse: 64 65  Resp: 16 18  Temp: 37 C   SpO2: 96% 96%    Last Pain:  Vitals:   03/01/20 1411  TempSrc:   PainSc: 0-No pain                 Christia Reading

## 2020-03-01 NOTE — OR Nursing (Signed)
PT in for eval @ 1409.

## 2020-03-01 NOTE — Anesthesia Procedure Notes (Addendum)
Procedure Name: Intubation Performed by: Khylin Gutridge, CRNA Pre-anesthesia Checklist: Patient identified, Patient being monitored, Timeout performed, Emergency Drugs available and Suction available Patient Re-evaluated:Patient Re-evaluated prior to induction Oxygen Delivery Method: Circle system utilized Preoxygenation: Pre-oxygenation with 100% oxygen Induction Type: IV induction Ventilation: Mask ventilation without difficulty Laryngoscope Size: Mac and 4 Grade View: Grade I Tube type: Oral Tube size: 7.0 mm Number of attempts: 1 Airway Equipment and Method: Stylet Placement Confirmation: ETT inserted through vocal cords under direct vision,  positive ETCO2 and breath sounds checked- equal and bilateral Secured at: 21 cm Tube secured with: Tape Dental Injury: Teeth and Oropharynx as per pre-operative assessment        

## 2020-03-01 NOTE — OR Nursing (Signed)
Dr. Joice Lofts in to speak with pt and spouse.  He added cough med robitussin AC rx to instructions and gave spouse script for same.  May d/c pt to home after PT & OT eval per Dr. Joice Lofts.  (He's aware pt had tylenol po & 2nd Ancef order.  He advises ok to d/c pt to home prior to Toradol being due at 1643. (may d/c to home without 2nd dose of toradol.)

## 2020-03-02 ENCOUNTER — Encounter: Payer: Self-pay | Admitting: Emergency Medicine

## 2020-03-02 ENCOUNTER — Emergency Department: Payer: Medicare HMO

## 2020-03-02 ENCOUNTER — Emergency Department
Admission: EM | Admit: 2020-03-02 | Discharge: 2020-03-02 | Disposition: A | Discharge status: Home / Self Care | Payer: Medicare HMO | Attending: Emergency Medicine | Admitting: Emergency Medicine

## 2020-03-02 ENCOUNTER — Other Ambulatory Visit: Payer: Self-pay

## 2020-03-02 DIAGNOSIS — Z7951 Long term (current) use of inhaled steroids: Secondary | ICD-10-CM | POA: Insufficient documentation

## 2020-03-02 DIAGNOSIS — I10 Essential (primary) hypertension: Secondary | ICD-10-CM | POA: Diagnosis not present

## 2020-03-02 DIAGNOSIS — E11319 Type 2 diabetes mellitus with unspecified diabetic retinopathy without macular edema: Secondary | ICD-10-CM | POA: Insufficient documentation

## 2020-03-02 DIAGNOSIS — Z7982 Long term (current) use of aspirin: Secondary | ICD-10-CM | POA: Insufficient documentation

## 2020-03-02 DIAGNOSIS — R059 Cough, unspecified: Secondary | ICD-10-CM | POA: Diagnosis not present

## 2020-03-02 DIAGNOSIS — Z7984 Long term (current) use of oral hypoglycemic drugs: Secondary | ICD-10-CM | POA: Insufficient documentation

## 2020-03-02 DIAGNOSIS — R0600 Dyspnea, unspecified: Secondary | ICD-10-CM

## 2020-03-02 DIAGNOSIS — R0602 Shortness of breath: Secondary | ICD-10-CM | POA: Diagnosis not present

## 2020-03-02 DIAGNOSIS — Z20822 Contact with and (suspected) exposure to covid-19: Secondary | ICD-10-CM | POA: Diagnosis not present

## 2020-03-02 DIAGNOSIS — Z79899 Other long term (current) drug therapy: Secondary | ICD-10-CM | POA: Insufficient documentation

## 2020-03-02 DIAGNOSIS — J45909 Unspecified asthma, uncomplicated: Secondary | ICD-10-CM | POA: Diagnosis not present

## 2020-03-02 DIAGNOSIS — E039 Hypothyroidism, unspecified: Secondary | ICD-10-CM | POA: Insufficient documentation

## 2020-03-02 LAB — COMPREHENSIVE METABOLIC PANEL
ALT: 20 U/L (ref 0–44)
AST: 19 U/L (ref 15–41)
Albumin: 3.9 g/dL (ref 3.5–5.0)
Alkaline Phosphatase: 54 U/L (ref 38–126)
Anion gap: 8 (ref 5–15)
BUN: 16 mg/dL (ref 8–23)
CO2: 21 mmol/L — ABNORMAL LOW (ref 22–32)
Calcium: 9 mg/dL (ref 8.9–10.3)
Chloride: 109 mmol/L (ref 98–111)
Creatinine, Ser: 0.45 mg/dL (ref 0.44–1.00)
GFR, Estimated: >60 mL/min (ref >60)
Glucose, Bld: 148 mg/dL — ABNORMAL HIGH (ref 70–99)
Potassium: 4.1 mmol/L (ref 3.5–5.1)
Sodium: 138 mmol/L (ref 135–145)
Total Bilirubin: 0.8 mg/dL (ref 0.3–1.2)
Total Protein: 6.5 g/dL (ref 6.5–8.1)

## 2020-03-02 LAB — TROPONIN I (HIGH SENSITIVITY): Troponin I (High Sensitivity): 4 ng/L (ref <18)

## 2020-03-02 LAB — CBC
HCT: 35.5 % — ABNORMAL LOW (ref 36.0–46.0)
Hemoglobin: 12.1 g/dL (ref 12.0–15.0)
MCH: 31.2 pg (ref 26.0–34.0)
MCHC: 34.1 g/dL (ref 30.0–36.0)
MCV: 91.5 fL (ref 80.0–100.0)
Platelets: 134 10*3/uL — ABNORMAL LOW (ref 150–400)
RBC: 3.88 MIL/uL (ref 3.87–5.11)
RDW: 13.3 % (ref 11.5–15.5)
WBC: 7.7 10*3/uL (ref 4.0–10.5)
nRBC: 0 % (ref 0.0–0.2)

## 2020-03-02 LAB — SURGICAL PATHOLOGY

## 2020-03-02 LAB — RESPIRATORY PANEL BY RT PCR (FLU A&B, COVID)
Influenza A by PCR: NEGATIVE
Influenza B by PCR: NEGATIVE
SARS Coronavirus 2 by RT PCR: NEGATIVE

## 2020-03-02 MED ORDER — IOHEXOL 350 MG/ML SOLN
75.0000 mL | Freq: Once | INTRAVENOUS | Status: AC | PRN
  Administered 2020-03-02: 75 mL via INTRAVENOUS

## 2020-03-02 NOTE — Discharge Summary (Signed)
Physician Discharge Summary  Patient ID: Jessica Cole MRN: [id number] DOB/AGE: 71-Mar-1952 71 y.o.  Admit date: 03/01/2020 Discharge date: 03/01/20  Admission Diagnoses:  Primary osteoarthritis of left shoulder M19.012 Rotator cuff tendinitis, left M75.82  Discharge Diagnoses: Patient Active Problem List   Diagnosis Date Noted   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) 01/03/2019   Chronic cough 11/19/2016   Morbid obesity (HCC) 09/26/2016   Left ovarian cyst 05/30/2016   S/P lumbar spinal fusion 12/13/2015   Aortic calcification (HCC) 07/18/2015   Controlled type 2 diabetes mellitus without complication (HCC) 05/16/2015   Thrombocytopenia (HCC) 05/16/2015   Recurrent major depressive disorder, in full remission (HCC) 05/16/2015   Essential (primary) hypertension 05/16/2015   Fatty infiltration of liver 03/15/2015   Aortic valve stenosis, nonrheumatic 01/18/2015   Sciatica of right side 01/09/2015   Type 2 diabetes mellitus (HCC) 11/10/2014   RLS (restless legs syndrome) 08/23/2014   Neuritis or radiculitis due to rupture of lumbar intervertebral disc 06/13/2014   Degeneration of intervertebral disc of lumbar region 06/13/2014   Lumbar radiculitis 06/13/2014   Arthritis 01/23/2014   Arthritis of knee, degenerative 11/15/2013   Depression 09/29/2013   Insomnia 09/29/2013   Apnea, sleep 08/29/2013   History of nephrolithiasis 08/29/2013   Abnormal presence of protein in urine 08/29/2013   Headache, migraine 08/29/2013   BP (high blood pressure) 08/29/2013   HLD (hyperlipidemia) 08/29/2013   Allergic rhinitis 08/29/2013   Intractable migraine without aura 02/18/2013    Past Medical History:  Diagnosis Date   Anxiety    Asthma    seasonal with allergies   Colon polyps    Degenerative arthritis    Depression    Diabetes mellitus without  complication (HCC)    Type II   Environmental allergies    Family history of adverse reaction to anesthesia    sister- PONV   Fatty liver    Glaucoma    Headache(784.0)    HX  MIGRAINES   History of bronchitis    History of kidney stones    History of pneumonia    Hypertension    Hypothyroidism    Obesity    OSA on CPAP    Plantar fasciitis    right   PONV (postoperative nausea and vomiting)    Renal calculi    Renal calculi    RLS (restless legs syndrome) 08/23/2014     Transfusion: None.   Consultants (if any):   Discharged Condition: Improved  Hospital Course: Jessica Cole is an 69 y.o. female who was admitted 03/01/2020 with a diagnosis of advanced degenerative joint disease with rotator cuff tendonopathy of the left shoulder and went to the operating room on 03/01/2020 and underwent the above named procedures.    Surgeries: Procedure(s): REVERSE SHOULDER ARTHROPLASTY on 03/01/2020 Patient tolerated the surgery well. Taken to PACU where she was stabilized and held in the recovery area.  The patient was able to successfully working with PT and OT yesterday and was deemed stable to discharge home with HHPT.  Patient's IV was removed on POD0.  Implants: All press-fit Integra system with a 11 mm stem, a small metaphyseal body, a +0 mm humeral platform, a mini baseplate, and a 38 mm concentric +2 mm laterally offset glenosphere.  She was given perioperative antibiotics:  Anti-infectives (From admission, onward)   Start     Dose/Rate Route Frequency Ordered Stop   03/01/20 1400  ceFAZolin (  ANCEF) IVPB 2g/100 mL premix  Status:  Discontinued        2 g 200 mL/hr over 30 Minutes Intravenous Every 6 hours 03/01/20 1315 03/01/20 2113   03/01/20 1335  ceFAZolin (ANCEF) 2-4 GM/100ML-% IVPB       Note to Pharmacy: Nicholes Rough   : cabinet override      03/01/20 1335 03/01/20 1345   03/01/20 0615  ceFAZolin (ANCEF) IVPB 2g/100 mL premix        2 g 200 mL/hr  over 30 Minutes Intravenous On call to O.R. 03/01/20 0603 03/01/20 0803   03/01/20 1324  ceFAZolin (ANCEF) 2-4 GM/100ML-% IVPB       Note to Pharmacy: Malva Limes   : cabinet override      03/01/20 0613 03/01/20 0825    .  She was given TED hoes and encouraged early ambulation for DVT prophylaxis.  She benefited maximally from the hospital stay and there were no complications.    Recent vital signs:  Vitals:   03/01/20 1525 03/01/20 1601  BP: 121/70 126/68  Pulse: 62 (!) 59  Resp: 16 18  Temp: 97.9 F (36.6 C)   SpO2: 98% 98%    Recent laboratory studies:  Lab Results  Component Value Date   HGB 13.7 02/22/2020   HGB 14.0 05/09/2017   HGB 14.2 08/18/2016   Lab Results  Component Value Date   WBC 5.4 02/22/2020   PLT 161 02/22/2020   Lab Results  Component Value Date   INR 0.99 04/18/2016   Lab Results  Component Value Date   NA 141 02/22/2020   K 3.6 02/22/2020   CL 105 02/22/2020   CO2 26 02/22/2020   BUN 16 02/22/2020   CREATININE 0.68 02/22/2020   GLUCOSE 99 02/22/2020   Discharge Medications:   Allergies as of 03/01/2020      Reactions   Diazepam Other (See Comments)   hallucinations   Dexamethasone Other (See Comments)   During a tapered dose, once.  Was shaky (side effect, not allergy).      Medication List    STOP taking these medications   Aller-Ease 180 MG tablet Generic drug: fexofenadine   diclofenac sodium 1 % Gel Commonly known as: VOLTAREN   losartan 50 MG tablet Commonly known as: COZAAR   PREVAGEN PO   topiramate 100 MG tablet Commonly known as: TOPAMAX   traMADol 50 MG tablet Commonly known as: ULTRAM   UNABLE TO FIND     TAKE these medications   albuterol 108 (90 Base) MCG/ACT inhaler Commonly known as: VENTOLIN HFA Inhale into the lungs.   ASPIRIN 81 PO Take by mouth in the morning, at noon, and at bedtime.   azelastine 0.1 % nasal spray Commonly known as: ASTELIN Place 1 spray into both nostrils at  bedtime as needed.   Breo Ellipta 200-25 MCG/INH Aepb Generic drug: fluticasone furoate-vilanterol Take 1 puff by mouth daily.   DULoxetine 60 MG capsule Commonly known as: CYMBALTA Take 30 mg by mouth daily.   fluticasone 50 MCG/ACT nasal spray Commonly known as: FLONASE Place 2 sprays into both nostrils 2 (two) times daily.   gabapentin 800 MG tablet Commonly known as: NEURONTIN TAKE 1 TABLET BY MOUTH TWICE A DAY   guaiFENesin-codeine 100-10 MG/5ML syrup Commonly known as: ROBITUSSIN AC Take 5 mLs by mouth 3 (three) times daily as needed for cough.   latanoprost 0.005 % ophthalmic solution Commonly known as: XALATAN Place 1 drop into both eyes at  bedtime.   levothyroxine 112 MCG tablet Commonly known as: SYNTHROID Take 112 mcg by mouth daily before breakfast.   meloxicam 7.5 MG tablet Commonly known as: MOBIC Take 7.5 mg by mouth daily.   metFORMIN 500 MG tablet Commonly known as: GLUCOPHAGE Take 500 mg by mouth 2 (two) times daily with a meal.   metoprolol succinate 50 MG 24 hr tablet Commonly known as: TOPROL-XL TAKE 1 TABLET (50 MG TOTAL) BY MOUTH DAILY. TAKE WITH OR IMMEDIATELY FOLLOWING A MEAL.   montelukast 10 MG tablet Commonly known as: SINGULAIR   ONE TOUCH ULTRA TEST test strip Generic drug: glucose blood USE 2 (TWO) TIMES DAILY USE AS INSTRUCTED.   oxybutynin 5 MG tablet Commonly known as: DITROPAN Take 5 mg by mouth 2 (two) times daily.   oxyCODONE 5 MG immediate release tablet Commonly known as: Roxicodone Take 1-2 tablets (5-10 mg total) by mouth every 4 (four) hours as needed for moderate pain or severe pain.   pravastatin 10 MG tablet Commonly known as: PRAVACHOL Take 10 mg by mouth daily.   rOPINIRole 4 MG tablet Commonly known as: REQUIP Take 1 tablet (4 mg total) by mouth 2 (two) times daily.   telmisartan 40 MG tablet Commonly known as: MICARDIS Take 40 mg by mouth daily.   UNABLE TO FIND 3 tablets. Med Name: Hair la Vie.  Takes 2 in the morning and 1 at night      Diagnostic Studies: DG Shoulder Left Port  Result Date: 03/01/2020 CLINICAL DATA:  Post shoulder arthroplasty EXAM: LEFT SHOULDER COMPARISON:  None. FINDINGS: There are postoperative changes reverse left shoulder arthroplasty. No fracture or malalignment. IMPRESSION: Standard appearance reverse left shoulder arthroplasty. Electronically Signed   By: Guadlupe Spanish M.D.   On: 03/01/2020 10:54   [place] OR NERVE BLOCK-IMAGE ONLY [facility])  Result Date: 03/01/2020 There is no interpretation for this exam.  This order is for images obtained during a surgical procedure.  Please See "Surgeries" Tab for more information regarding the procedure.   Disposition: Discharge disposition: 01-Home or Self Care        Follow-up Information    Poggi, Excell Seltzer, MD Follow up.   Specialty: Orthopedic Surgery Why: March 16, 2020 @ 1:15 pm with Janell Quiet Contact information: [phone] [facility] [address] [facility] [place] [address] [phone]              Signed: Meriel Pica PA-C 03/02/2020, 7:39 AM

## 2020-03-02 NOTE — ED Provider Notes (Signed)
[facility] Emergency Department Provider Note  Time seen: 10:56 AM  I have reviewed the triage vital signs and the nursing notes.   HISTORY  Chief Complaint Shortness of Breath   HPI Jessica Cole is a 69 y.o. female with a past medical history anxiety, diabetes, hypertension, presents to the emergency department for cough and shortness of breath.  According to the patient she had a left rotator cuff surgery performed yesterday by Dr.Poggi.  Apparently after the surgery patient was coughing and they sent the patient home with a cough medication.  Patient states this morning she is feeling very short of breath with very frequent cough so she returned back to the emergency department for evaluation.  Denies any known fever.  No vomiting or diarrhea.  Patient states she was feeling well when she went to sleep last night she was coughing but did not feel short of breath like she does today.   Past Medical History:  Diagnosis Date  . Anxiety   . Asthma    seasonal with allergies  . Colon polyps   . Degenerative arthritis   . Depression   . Diabetes mellitus without complication (HCC)    Type II  . Environmental allergies   . Family history of adverse reaction to anesthesia    sister- PONV  . Fatty liver   . Glaucoma   . Headache(784.0)    HX  MIGRAINES  . History of bronchitis   . History of kidney stones   . History of pneumonia   . Hypertension   . Hypothyroidism   . Obesity   . OSA on CPAP   . Plantar fasciitis    right  . PONV (postoperative nausea and vomiting)   . Renal calculi   . Renal calculi   . RLS (restless legs syndrome) 08/23/2014    Patient Active Problem List   Diagnosis Date Noted  . 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) 01/03/2019  . Chronic cough 11/19/2016  . Morbid obesity (HCC) 09/26/2016  . Left ovarian cyst 05/30/2016  . S/P lumbar  spinal fusion 12/13/2015  . Aortic calcification (HCC) 07/18/2015  . Controlled type 2 diabetes mellitus without complication (HCC) 05/16/2015  . Thrombocytopenia (HCC) 05/16/2015  . Recurrent major depressive disorder, in full remission (HCC) 05/16/2015  . Essential (primary) hypertension 05/16/2015  . Fatty infiltration of liver 03/15/2015  . Aortic valve stenosis, nonrheumatic 01/18/2015  . Sciatica of right side 01/09/2015  . Type 2 diabetes mellitus (HCC) 11/10/2014  . RLS (restless legs syndrome) 08/23/2014  . Neuritis or radiculitis due to rupture of lumbar intervertebral disc 06/13/2014  . Degeneration of intervertebral disc of lumbar region 06/13/2014  . Lumbar radiculitis 06/13/2014  . Arthritis 01/23/2014  . Arthritis of knee, degenerative 11/15/2013  . Depression 09/29/2013  . Insomnia 09/29/2013  . Apnea, sleep 08/29/2013  . History of nephrolithiasis 08/29/2013  . Abnormal presence of protein in urine 08/29/2013  . Headache, migraine 08/29/2013  . BP (high blood pressure) 08/29/2013  . HLD (hyperlipidemia) 08/29/2013  . Allergic rhinitis 08/29/2013  . Intractable migraine without aura 02/18/2013    Past Surgical History:  Procedure Laterality Date  . ABDOMINAL HYSTERECTOMY     partial  . APPENDECTOMY    . Arthroscopic surgery, knee Left   . BREAST BIOPSY Right    several  . BREAST BIOPSY Right 03/11/2017   u/s bx neg  . BUNIONECTOMY  LEFT   . COLONOSCOPY W/ POLYPECTOMY    . COLONOSCOPY WITH PROPOFOL N/A 08/14/2017   Procedure: COLONOSCOPY WITH PROPOFOL;  Surgeon: Christena Deem, MD;  Location[facility] ENDOSCOPY;  Service: Endoscopy;  Laterality: N/A;  . EXTRACORPOREAL SHOCK WAVE LITHOTRIPSY Left 05/14/2017   Procedure: EXTRACORPOREAL SHOCK WAVE LITHOTRIPSY (ESWL);  Surgeon: Riki Altes, MD;  Location: [facility] ORS;  Service: Urology;  Laterality: Left;  . FOOT SURGERY     RIGHT    . KNEE ARTHROSCOPY W/ MENISCAL REPAIR Left   . LASIK    . MAXIMUM  ACCESS (MAS)POSTERIOR LUMBAR INTERBODY FUSION (PLIF) 1 LEVEL N/A 04/25/2016   Procedure: LUMBAR FOUR-FIVE  MAXIMUM ACCESS (MAS) POSTERIOR LUMBAR INTERBODY FUSION (PLIF) with extension of instrumentation LUMBAR TWO-FIVE;  Surgeon: Tia Alert, MD;  Location: [facility] OR;  Service: Neurosurgery;  Laterality: N/A;  . MAXIMUM ACCESS (MAS)POSTERIOR LUMBAR INTERBODY FUSION (PLIF) 2 LEVEL N/A 12/13/2015   Procedure: Lumbar two-three - Lumbar three-four MAXIMUM ACCESS (MAS) POSTERIOR LUMBAR INTERBODY FUSION (PLIF)  ;  Surgeon: Tia Alert, MD;  Location: [facility] NEURO ORS;  Service: Neurosurgery;  Laterality: N/A;  . SHOULDER SURGERY     RIGHT   . TONSILLECTOMY      Prior to Admission medications   Medication Sig Start Date End Date Taking? Authorizing Provider  albuterol (PROVENTIL HFA;VENTOLIN HFA) 108 (90 Base) MCG/ACT inhaler Inhale into the lungs.     [provider]  ASPIRIN 81 PO Take by mouth in the morning, at noon, and at bedtime.     [provider]  azelastine (ASTELIN) 0.1 % nasal spray Place 1 spray into both nostrils at bedtime as needed.  11/23/15   [provider]  BREO ELLIPTA 200-25 MCG/INH AEPB Take 1 puff by mouth daily.  03/05/17   [provider]  DULoxetine (CYMBALTA) 60 MG capsule Take 30 mg by mouth daily.    [provider]  fluticasone (FLONASE) 50 MCG/ACT nasal spray Place 2 sprays into both nostrils 2 (two) times daily.  04/12/14   [provider]  gabapentin (NEURONTIN) 800 MG tablet TAKE 1 TABLET BY MOUTH TWICE A DAY 09/28/19   Glean Salvo, NP  guaiFENesin-codeine (ROBITUSSIN AC) 100-10 MG/5ML syrup Take 5 mLs by mouth 3 (three) times daily as needed for cough. 03/01/20   Poggi, Excell Seltzer, MD  latanoprost (XALATAN) 0.005 % ophthalmic solution Place 1 drop into both eyes at bedtime.     [provider]  levothyroxine (SYNTHROID, LEVOTHROID) 112 MCG tablet Take 112 mcg by mouth daily before breakfast.  11/07/15   [provider]  meloxicam (MOBIC) 7.5 MG tablet Take 7.5 mg by mouth daily.    [provider]  metFORMIN (GLUCOPHAGE) 500 MG tablet Take 500 mg by mouth 2 (two) times daily with a meal.     [provider]  metoprolol succinate (TOPROL-XL) 50 MG 24 hr tablet TAKE 1 TABLET (50 MG TOTAL) BY MOUTH DAILY. TAKE WITH OR IMMEDIATELY FOLLOWING A MEAL. 10/25/19   Glean Salvo, NP  montelukast (SINGULAIR) 10 MG tablet  11/24/18   [provider]  ONE TOUCH ULTRA TEST test strip USE 2 (TWO) TIMES DAILY USE AS INSTRUCTED. 11/13/17   [provider]  oxybutynin (DITROPAN) 5 MG tablet Take 5 mg by mouth 2 (two) times daily.    [provider]  oxyCODONE (ROXICODONE) 5 MG immediate release tablet Take 1-2 tablets (5-10 mg total) by mouth every 4 (four) hours as needed for  moderate pain or severe pain. 03/01/20   Poggi, Excell Seltzer, MD  pravastatin (PRAVACHOL) 10 MG tablet Take 10 mg by mouth daily.    [provider]  rOPINIRole (REQUIP) 4 MG tablet Take 1 tablet (4 mg total) by mouth 2 (two) times daily. 05/09/19   Glean Salvo, NP  telmisartan (MICARDIS) 40 MG tablet Take 40 mg by mouth daily. 01/02/19   [provider]  UNABLE TO FIND 3 tablets. Med Name: Hair la Vie. Takes 2 in the morning and 1 at night    [provider]    Allergies  Allergen Reactions  . Diazepam Other (See Comments)    hallucinations  . Dexamethasone Other (See Comments)    During a tapered dose, once.  Was shaky (side effect, not allergy).    Family History  Problem Relation Age of Onset  . Lung cancer Mother   . Congestive Heart Failure Father   . Depression Sister   . Rheum arthritis Sister   . Headache Maternal Grandfather   . Migraines Maternal Grandfather   . Headache Maternal Uncle   . Diabetes Other   . Heart disease Other   . Hypertension Other   . Breast cancer Neg Hx   . Bladder Cancer Neg Hx   . Kidney cancer Neg Hx     Social  History Social History   Tobacco Use  . Smoking status: Never Smoker  . Smokeless tobacco: Never Used  Vaping Use  . Vaping Use: Never used  Substance Use Topics  . Alcohol use: No  . Drug use: No    Review of Systems Constitutional: Negative for fever. Cardiovascular: Negative for chest pain. Respiratory: Positive for shortness of breath.  Positive for cough. Gastrointestinal: Negative for abdominal pain, vomiting  Musculoskeletal: Left shoulder discomfort, recent surgery currently in an immobilizer. Skin: Negative for skin complaints  Neurological: Negative for headache All other ROS negative  ____________________________________________   PHYSICAL EXAM:  VITAL SIGNS: ED Triage Vitals [03/02/20 1040]  Enc Vitals Group     BP 131/67     Pulse Rate 74     Resp (!) 24     Temp      Temp Source Oral     SpO2 95 %     Weight      Height      Head Circumference      Peak Flow      Pain Score      Pain Loc      Pain Edu?      Excl. in GC?    Constitutional: Alert and oriented. Well appearing and in no distress. Eyes: Normal exam ENT      Head: Normocephalic and atraumatic.      Mouth/Throat: Mucous membranes are moist. Cardiovascular: Normal rate, regular rhythm. No murmur Respiratory: Mild tachypnea frequent cough during exam.  No obvious wheeze rales or rhonchi. Gastrointestinal: Soft and nontender. No distention.  Musculoskeletal: Left shoulder is in an immobilizer.  Neuro vastly intact distally. Neurologic:  Normal speech and language. No gross focal neurologic deficits Skin:  Skin is warm, dry and intact.  Psychiatric: Mood and affect are normal.   ____________________________________________  RADIOLOGY  I personally reviewed the images the patient's chest x-ray appears to have opacities throughout both lung fields more so on the left. Official radiology read shows subtle opacities on the left side.  CT scan shows no acute  abnormality.  ____________________________________________   INITIAL IMPRESSION / ASSESSMENT AND PLAN /  ED COURSE  Pertinent labs & imaging results that were available during my care of the patient were reviewed by me and considered in my medical decision making (see chart for details).   Patient presents to the emergency department for cough and shortness of breath status post left shoulder surgery yesterday.  Taking cough medication at home without relief.  Patient does have mild tachypnea.  Differential would include pneumonia, aspiration pneumonia, diaphragmatic paralysis.  We will check labs, inspiratory/expiratory chest x-rays and continue to closely monitor.  Patient agreeable to plan of care.  Chest x-ray appears to have possible opacities worse atelectasis.  We will obtain a CTA of the chest to further evaluate.  CTA of the chest is largely negative.  I went to reevaluate the patient and she states she is feeling worlds better.  No longer feels short of breath, and only a rare cough.  Patient wishes to go home.  At this time I believe the patient is safe for discharge home.  Patient could be experiencing some atelectasis from the nerve block and is not entirely clear but the patient is feeling better.  I did discuss not using her guaifenesin codeine medication as all of her symptoms started just following taking this medication.  She will start taking her home pain medication as prescribed by her orthopedic surgeon.  I discussed very strict return precautions.  Patient agreeable plan of care.  Currently satting 99% on room air.  Jessica Cole was evaluated in Emergency Department on 03/02/2020 for the symptoms described in the history of present illness. She was evaluated in the context of the global COVID-19 pandemic, which necessitated consideration that the patient might be at risk for infection with the SARS-CoV-2 virus that causes COVID-19. Institutional protocols and algorithms that  pertain to the evaluation of patients at risk for COVID-19 are in a state of rapid change based on information released by regulatory bodies including the CDC and federal and state organizations. These policies and algorithms were followed during the patient's care in the ED.  ____________________________________________   FINAL CLINICAL IMPRESSION(S) / ED DIAGNOSES  Cough Shortness of breath   Minna AntisPaduchowski, Phoebie Shad, MD 03/02/20 1426

## 2020-03-02 NOTE — ED Triage Notes (Signed)
Pt to ED via [facility] from home for difficulty breathing that started this morning. Pt states that she had rotator cuff surgery this yesterday morning. Pt is tachypneic at 24 BPM and has a barking sound cough. Pt seem to have more difficulty breathing when moving around. EPD Paduchowski at bedside to see pt.

## 2020-03-02 NOTE — ED Notes (Signed)
First Nurse Note: Pt to ED via [facility] from home for difficulty breathing. Pt had rotator cuff surgery yesterday. EMS reports that pt is burping a lot and has a barking cough. Pt CBG 158, CO2 29, HR 72.  Pt was given cough medication yesterday, pt states that nerve block still intact so she is not having pain.

## 2020-03-27 ENCOUNTER — Ambulatory Visit: Payer: Medicare HMO | Attending: Family Medicine

## 2020-03-27 ENCOUNTER — Other Ambulatory Visit: Payer: Self-pay

## 2020-03-27 DIAGNOSIS — G8929 Other chronic pain: Secondary | ICD-10-CM | POA: Insufficient documentation

## 2020-03-27 DIAGNOSIS — M6281 Muscle weakness (generalized): Secondary | ICD-10-CM | POA: Diagnosis present

## 2020-03-27 DIAGNOSIS — M25512 Pain in left shoulder: Secondary | ICD-10-CM | POA: Insufficient documentation

## 2020-03-27 DIAGNOSIS — M25612 Stiffness of left shoulder, not elsewhere classified: Secondary | ICD-10-CM | POA: Diagnosis not present

## 2020-03-27 NOTE — Therapy (Signed)
[place] [facility] PHYS[facility] AND [facility] MEDICINE [address] [place], [place], [address] Phone: [phone]   Fax:  [phone]  Physical Therapy Evaluation  Patient Details  Name: Jessica PutnamGail T Cole MRN: [id number] Date of Birth: 02-Jun-1950 Referring Provider (PT): Anson OregonJames Lance McGhee, [place]-C   Encounter Date: 03/27/2020    PT End of Session - 03/27/20 1110    Visit Number 1    Number of Visits 25    Date for PT Re-Evaluation 06/21/20    Authorization Type 1    Authorization Time Period of 10 progress report    PT Start Time 1110    PT Stop Time 1159    PT Time Calculation (min) 49 min    Activity Tolerance Patient tolerated treatment well    Behavior During Therapy [facility] for tasks assessed/performed           Past Medical History:  Diagnosis Date  . Anxiety   . Asthma    seasonal with allergies  . Colon polyps   . Degenerative arthritis   . Depression   . Diabetes mellitus without complication (HCC)    Type II  . Environmental allergies   . Family history of adverse reaction to anesthesia    sister- PONV  . Fatty liver   . Glaucoma   . Headache(784.0)    HX  MIGRAINES  . History of bronchitis   . History of kidney stones   . History of pneumonia   . Hypertension   . Hypothyroidism   . Obesity   . OSA on CPAP   . Plantar fasciitis    right  . PONV (postoperative nausea and vomiting)   . Renal calculi   . Renal calculi   . RLS (restless legs syndrome) 08/23/2014    Past Surgical History:  Procedure Laterality Date  . ABDOMINAL HYSTERECTOMY     partial  . APPENDECTOMY    . Arthroscopic surgery, knee Left   . BREAST BIOPSY Right    several  . BREAST BIOPSY Right 03/11/2017   u/s bx neg  . BUNIONECTOMY     LEFT   . COLONOSCOPY W/ POLYPECTOMY    . COLONOSCOPY WITH PROPOFOL N/A 08/14/2017   Procedure: COLONOSCOPY WITH PROPOFOL;  Surgeon: Christena DeemSkulskie, Martin U, MD;  [facility];  Service: Endoscopy;  Laterality: N/A;    . EXTRACORPOREAL SHOCK WAVE LITHOTRIPSY Left 05/14/2017   Procedure: EXTRACORPOREAL SHOCK WAVE LITHOTRIPSY (ESWL);  Surgeon: Riki AltesStoioff, Scott C, MD;  Location: [facility] ORS;  Service: Urology;  Laterality: Left;  . FOOT SURGERY     RIGHT    . KNEE ARTHROSCOPY W/ MENISCAL REPAIR Left   . LASIK    . MAXIMUM ACCESS (MAS)POSTERIOR LUMBAR INTERBODY FUSION (PLIF) 1 LEVEL N/A 04/25/2016   Procedure: LUMBAR FOUR-FIVE  MAXIMUM ACCESS (MAS) POSTERIOR LUMBAR INTERBODY FUSION (PLIF) with extension of instrumentation LUMBAR TWO-FIVE;  Surgeon: Tia Alertavid S Jones, MD;  [facility];  Service: Neurosurgery;  Laterality: N/A;  . MAXIMUM ACCESS (MAS)POSTERIOR LUMBAR INTERBODY FUSION (PLIF) 2 LEVEL N/A 12/13/2015   Procedure: Lumbar two-three - Lumbar three-four MAXIMUM ACCESS (MAS) POSTERIOR LUMBAR INTERBODY FUSION (PLIF)  ;  Surgeon: Tia Alertavid S Jones, MD;  [facility];  Service: Neurosurgery;  Laterality: N/A;  . REVERSE SHOULDER ARTHROPLASTY Left 03/01/2020   Procedure: REVERSE SHOULDER ARTHROPLASTY;  Surgeon: Christena FlakePoggi, John J, MD;  Location: [facility] ORS;  Service: Orthopedics;  Laterality: Left;  . SHOULDER SURGERY     RIGHT   . TONSILLECTOMY  There were no vitals filed for this visit.    Subjective Assessment - 03/27/20 1115    Subjective L shoulder: 1.5/10 currently (pt sitting, shoulder in a sling).    Pertinent History S/P L reverse total shoulder replacment. Did not know about only PROM for now. Had home health PT for 2 weeks which involved PROM shoulder abduction, flexion as well as isometric shoulder flexion and abduction.  Pt sleeps on her bed.  Next surgeon appointment is either first or second week of December around Thurday of Friday.  Pt also states having ringing in her ears which started since the surgery.    Patient Stated Goals Raise her L arm better, be able to pick up things as well as work on crafts.    Currently in Pain? Yes    Pain Score 2     Pain Location Shoulder    Pain  Orientation Left    Pain Type Surgical pain    Pain Onset 1 to 4 weeks ago    Pain Frequency Occasional              [facility] PT Assessment - 03/27/20 1126      Assessment   Medical Diagnosis S/P reverse L total shoulder replacement     Referring Provider (PT) Anson Oregon, PA-C    Onset Date/Surgical Date 03/01/20    Prior Therapy Home health PT for L shoulder      Precautions   Precaution Comments Reverse total shoulder       Restrictions   Other Position/Activity Restrictions Reverse total shoulder      Home Environment   Living Environment Private residence    Living Arrangements Spouse/significant other    Home Access Stairs to enter    Entrance Stairs-Number of Steps 6-8   back entrance   Home Layout One level      Prior Function   Level of Independence Independent    Vocation Retired    Leisure likes to bake, sew, read, gardening      Observation/Other Assessments   Focus on Therapeutic Outcomes (FOTO)  Shoulder FOTO 34      Posture/Postural Control   Posture Comments Forward neck B protracted shoulders.       PROM   Overall PROM Comments reclined    Right Shoulder Flexion 70 Degrees   Sitting on a chair, stiff end feel   Right Shoulder ABduction 58 Degrees    Right Shoulder External Rotation 5 Degrees   scapular plane     Palpation   Palpation comment R upper trap muscle tension                       Objective measurements completed on examination: See above findings.   No latex band allergies   03/27/2020: Blood pressure R arm sitting, mechanically taken, normal cuff: 170/80, HR 59  Last home health session was about over a week ago  L shoulder incisions: healing, scab formation with slight redness around the scabs.        Therapeutic exercise  Reviewed PROM for now and precaution  PROM performed in reclined position secondary to elevated systolic level at 170 mmHg PROM with PT: flexion 10x3  abduction 10x3  ER in  scaption 10x3  Pendulum circles 30 seconds clockwise. Dizziness. Eases with rest.    Improved exercise technique, movement at target joints, use of target muscles after mod verbal, visual, tactile cues.   Verbal and tactile cues also needed  to decrease L shoulder muscle activation to promote PROM.     Response to treatment Pt tolerated session well without aggravation of symptoms.    Clinical impression Pt is a 69 year old female who came to physical therapy S/P L reverse total shoulder replacement on 03/01/2020. She is currently about 3.75 weeks post op. She also presents with poor posture, muscle tension, limited ROM, and weakness as well as difficulty reaching. Pt will benefit from skilled physical therapy services to address the aforementioned deficits.               PT Education - 03/27/20 1218    Education provided Yes    Education Details PROM, precautions    Person(s) Educated Patient    Methods Explanation;Demonstration;Tactile cues;Verbal cues    Comprehension Verbalized understanding;Returned demonstration            PT Short Term Goals - 03/27/20 1223      PT SHORT TERM GOAL #1   Title Pt will be independent with her initial HEP to improve ROM and ability to raise her arm with less difficulty when appropriate.    Time 3    Period Weeks    Status New    Target Date 04/19/20             PT Long Term Goals - 03/27/20 1225      PT LONG TERM GOAL #1   Title Patient will improve L shoulder PROM flexion, and abduction AROM to at least 155 degrees, and ER to 80 degrees to promote ability to reach with less difficulty when appropriate.    Baseline PROM flexion 70 degrees, abduction 58 degrees, ER 5 degrees (scapular plane) 03/27/2020.    Time 12    Period Weeks    Status New    Target Date 06/21/20      PT LONG TERM GOAL #2   Title Pt will have at least 145 degrees L shoulder flexion and abduction AROM, and 75 degrees ER AROM to promote ability to  reach with less difficulty when appropriate.    Baseline AROM not yet performed (03/27/2020)    Time 12    Period Weeks    Status New    Target Date 06/21/20      PT LONG TERM GOAL #3   Title PT will improve her FOTO score by at least 20 points as a demonstration of improved function.    Baseline Shoulder FOTO: 34 (03/27/2020)    Time 12    Period Weeks    Status New    Target Date 06/21/20                  Plan - 03/27/20 1218    Clinical Impression Statement Pt is a 69 year old female who came to physical therapy S/P L reverse total shoulder replacement on 03/01/2020. She is currently about 3.75 weeks post op. She also presents with poor posture, muscle tension, limited ROM, and weakness as well as difficulty reaching. Pt will benefit from skilled physical therapy services to address the aforementioned deficits.    Personal Factors and Comorbidities Age;Comorbidity 3+;Fitness;Time since onset of injury/illness/exacerbation    Comorbidities anxiety, depression, DM, HTN    Examination-Activity Limitations Bathing;Hygiene/Grooming;Lift;Reach Overhead;Toileting;Carry    Stability/Clinical Decision Making Stable/Uncomplicated    Clinical Decision Making Low    Clinical Presentation due to: Minimal pain at evaluation    Rehab Potential Fair    Clinical Impairments Affecting Rehab Potential Possible difficulty adhering to  precautions. Pt needs constant reminder and cueing for PROM during the first few weeks of rehab.    PT Frequency 2x / week    PT Duration 12 weeks    PT Treatment/Interventions Therapeutic activities;Therapeutic exercise;Neuromuscular re-education;Patient/family education;Manual techniques;Passive range of motion;Dry needling;Vestibular;Canalith Repostioning;Electrical Stimulation;Iontophoresis 4mg /ml Dexamethasone    PT Next Visit Plan PROM, manual techniques, modalities PRN. [facility] Reverse Total Shoulder Replacement Protocol unless  otherwise noted by MD.    Consulted and Agree with Plan of Care Patient           Patient will benefit from skilled therapeutic intervention in order to improve the following deficits and impairments:  Pain, Postural dysfunction, Improper body mechanics, Decreased strength, Decreased range of motion  Visit Diagnosis: Stiffness of left shoulder, not elsewhere classified - Plan: PT plan of care cert/re-cert  Chronic left shoulder pain - Plan: PT plan of care cert/re-cert  Muscle weakness (generalized) - Plan: PT plan of care cert/re-cert     Problem List Patient Active Problem List   Diagnosis Date Noted  . 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) 01/03/2019  . Chronic cough 11/19/2016  . Morbid obesity (HCC) 09/26/2016  . Left ovarian cyst 05/30/2016  . S/P lumbar spinal fusion 12/13/2015  . Aortic calcification (HCC) 07/18/2015  . Controlled type 2 diabetes mellitus without complication (HCC) 05/16/2015  . Thrombocytopenia (HCC) 05/16/2015  . Recurrent major depressive disorder, in full remission (HCC) 05/16/2015  . Essential (primary) hypertension 05/16/2015  . Fatty infiltration of liver 03/15/2015  . Aortic valve stenosis, nonrheumatic 01/18/2015  . Sciatica of right side 01/09/2015  . Type 2 diabetes mellitus (HCC) 11/10/2014  . RLS (restless legs syndrome) 08/23/2014  . Neuritis or radiculitis due to rupture of lumbar intervertebral disc 06/13/2014  . Degeneration of intervertebral disc of lumbar region 06/13/2014  . Lumbar radiculitis 06/13/2014  . Arthritis 01/23/2014  . Arthritis of knee, degenerative 11/15/2013  . Depression 09/29/2013  . Insomnia 09/29/2013  . Apnea, sleep 08/29/2013  . History of nephrolithiasis 08/29/2013  . Abnormal presence of protein in urine 08/29/2013  . Headache, migraine 08/29/2013  . BP (high blood pressure) 08/29/2013  . HLD (hyperlipidemia)  08/29/2013  . Allergic rhinitis 08/29/2013  . Intractable migraine without aura 02/18/2013    02/20/2013 PT, DPT   03/27/2020, 12:42 PM   [facility] PHYSICAL AND SPORTS MEDICINE [address], [place] Phone: [phone]   Fax:  [phone]  Name: TRESSA MALDONADO MRN: Jessica Cole Date of Birth: 25-Jan-1951

## 2020-04-03 ENCOUNTER — Other Ambulatory Visit: Payer: Self-pay

## 2020-04-03 ENCOUNTER — Ambulatory Visit: Payer: Medicare HMO

## 2020-04-03 DIAGNOSIS — G8929 Other chronic pain: Secondary | ICD-10-CM

## 2020-04-03 DIAGNOSIS — M6281 Muscle weakness (generalized): Secondary | ICD-10-CM

## 2020-04-03 DIAGNOSIS — M25612 Stiffness of left shoulder, not elsewhere classified: Secondary | ICD-10-CM

## 2020-04-03 NOTE — Patient Instructions (Signed)
Access Code: [facility] URL: [email or web address] Date: 04/03/2020 Prepared by: Loralyn Freshwater  Exercises Flexion-Extension Shoulder Pendulum with Table Support - 3 x daily - 7 x weekly - 3 sets - 15 reps

## 2020-04-03 NOTE — Therapy (Signed)
[place] [facility] PHYS[facility] SPORTS MEDICINE [address] [place], [place], [address] Phone: [phone]   Fax:  [phone]  Physical Therapy Treatment  Patient Details  Name: Jessica PutnamGail T Victorino MRN: [id number] Date of Birth: [birth year] Referring Provider (PT): Anson OregonJames Lance McGhee, [place]-C   Encounter Date: 04/03/2020   PT End of Session - 04/03/20 1348    Visit Number 2    Number of Visits 25    Date for PT Re-Evaluation 06/21/20    Authorization Type 2    Authorization Time Period of 10 progress report    PT Start Time 1348    PT Stop Time 1430    PT Time Calculation (min) 42 min    Activity Tolerance Patient tolerated treatment well    Behavior During Therapy [facility] for tasks assessed/performed           Past Medical History:  Diagnosis Date  . Anxiety   . Asthma    seasonal with allergies  . Colon polyps   . Degenerative arthritis   . Depression   . Diabetes mellitus without complication (HCC)    Type II  . Environmental allergies   . Family history of adverse reaction to anesthesia    sister- PONV  . Fatty liver   . Glaucoma   . Headache(784.0)    HX  MIGRAINES  . History of bronchitis   . History of kidney stones   . History of pneumonia   . Hypertension   . Hypothyroidism   . Obesity   . OSA on CPAP   . Plantar fasciitis    right  . PONV (postoperative nausea and vomiting)   . Renal calculi   . Renal calculi   . RLS (restless legs syndrome) 08/23/2014    Past Surgical History:  Procedure Laterality Date  . ABDOMINAL HYSTERECTOMY     partial  . APPENDECTOMY    . Arthroscopic surgery, knee Left   . BREAST BIOPSY Right    several  . BREAST BIOPSY Right 03/11/2017   u/s bx neg  . BUNIONECTOMY     LEFT   . COLONOSCOPY W/ POLYPECTOMY    . COLONOSCOPY WITH PROPOFOL N/A 08/14/2017   Procedure: COLONOSCOPY WITH PROPOFOL;  Surgeon: Christena DeemSkulskie, Martin U, MD;  [facility];  Service: Endoscopy;  Laterality: N/A;  .  EXTRACORPOREAL SHOCK WAVE LITHOTRIPSY Left 05/14/2017   Procedure: EXTRACORPOREAL SHOCK WAVE LITHOTRIPSY (ESWL);  Surgeon: Riki AltesStoioff, Scott C, MD;  Location: [facility] ORS;  Service: Urology;  Laterality: Left;  . FOOT SURGERY     RIGHT    . KNEE ARTHROSCOPY W/ MENISCAL REPAIR Left   . LASIK    . MAXIMUM ACCESS (MAS)POSTERIOR LUMBAR INTERBODY FUSION (PLIF) 1 LEVEL N/A 04/25/2016   Procedure: LUMBAR FOUR-FIVE  MAXIMUM ACCESS (MAS) POSTERIOR LUMBAR INTERBODY FUSION (PLIF) with extension of instrumentation LUMBAR TWO-FIVE;  Surgeon: Tia Alertavid S Jones, MD;  [facility];  Service: Neurosurgery;  Laterality: N/A;  . MAXIMUM ACCESS (MAS)POSTERIOR LUMBAR INTERBODY FUSION (PLIF) 2 LEVEL N/A 12/13/2015   Procedure: Lumbar two-three - Lumbar three-four MAXIMUM ACCESS (MAS) POSTERIOR LUMBAR INTERBODY FUSION (PLIF)  ;  Surgeon: Tia Alertavid S Jones, MD;  [facility];  Service: Neurosurgery;  Laterality: N/A;  . REVERSE SHOULDER ARTHROPLASTY Left 03/01/2020   Procedure: REVERSE SHOULDER ARTHROPLASTY;  Surgeon: Christena FlakePoggi, John J, MD;  Location: [facility] ORS;  Service: Orthopedics;  Laterality: Left;  . SHOULDER SURGERY     RIGHT   . TONSILLECTOMY  There were no vitals filed for this visit.   Subjective Assessment - 04/03/20 1349    Subjective L shoulder feels pretty good. Bothered her some last night. Better this morning. Felt diffierent a couple days ago. Husband said that she might have been moving her arm.    Pertinent History S/P L reverse total shoulder replacment. Did not know about only PROM for now. Had home health PT for 2 weeks which involved PROM shoulder abduction, flexion as well as isometric shoulder flexion and abduction.  Pt sleeps on her bed.  Next surgeon appointment is either first or second week of December around Thurday of Friday.  Pt also states having ringing in her ears which started since the surgery.    Patient Stated Goals Raise her L arm better, be able to pick up things as well as work on  crafts.    Currently in Pain? No/denies    Pain Onset 1 to 4 weeks ago                                     PT Education - 04/03/20 1405    Education provided Yes    Education Details ther-ex    [employer]) Educated Patient    Methods Explanation;Demonstration;Tactile cues;Verbal cues    Comprehension Returned demonstration;Verbalized understanding          Objective  No latex band allergies   03/27/2020: Blood pressure R arm sitting, mechanically taken, normal cuff: 170/80, HR 59  Last home health session was about over a week ago  L shoulder incisions: healing, scab formation with slight redness around the scabs.      4.5 weeks post op   [employer] Access Code [facility]   Therapeutic exercise  Seated PROM L shoulder   Flexion 10x3 for 2 sets  scaption 10x3 for 2 sets  Abduction 10x3 for 2 sets   Scapular plane: ER 10x3 for 2 sets   Pendulum   Flexion 30 seconds x 3  Reviewed and given as part of her HEP. Pt demonstrated and verbalized undrstanding. Handout provided  Improved exercise technique, movement at target joints, use of target muscles after mod verbal, visual, tactile cues.   Verbal and tactile cues also needed to decrease L shoulder muscle activation to promote PROM.     Response to treatment Pt tolerated session well without aggravation of symptoms. Stiff end feel    Clinical impression Continued working on improving L shoulder PROM flexion, scaption, abduction and ER. Stiff end feel. Discomfort at times at end range but eases with rest. Pt tolerated session well without aggravation of symptoms. Pt will benefit from continued skilled physical therapy services to decrease stiffness, improve ROM, strength, and function.        PT Short Term Goals - 03/27/20 1223      PT SHORT TERM GOAL #1   Title Pt will be independent with her initial HEP to improve ROM and ability to raise her arm with less difficulty when  appropriate.    Time 3    Period Weeks    Status New    Target Date 04/19/20             PT Long Term Goals - 03/27/20 1225      PT LONG TERM GOAL #1   Title Patient will improve L shoulder PROM flexion, and abduction AROM to at least 155 degrees, and ER to 80  degrees to promote ability to reach with less difficulty when appropriate.    Baseline PROM flexion 70 degrees, abduction 58 degrees, ER 5 degrees (scapular plane) 03/27/2020.    Time 12    Period Weeks    Status New    Target Date 06/21/20      PT LONG TERM GOAL #2   Title Pt will have at least 145 degrees L shoulder flexion and abduction AROM, and 75 degrees ER AROM to promote ability to reach with less difficulty when appropriate.    Baseline AROM not yet performed (03/27/2020)    Time 12    Period Weeks    Status New    Target Date 06/21/20      PT LONG TERM GOAL #3   Title PT will improve her FOTO score by at least 20 points as a demonstration of improved function.    Baseline Shoulder FOTO: 34 (03/27/2020)    Time 12    Period Weeks    Status New    Target Date 06/21/20                 Plan - 04/03/20 1605    Clinical Impression Statement Continued working on improving L shoulder PROM flexion, scaption, abduction and ER. Stiff end feel. Discomfort at times at end range but eases with rest. Pt tolerated session well without aggravation of symptoms. Pt will benefit from continued skilled physical therapy services to decrease stiffness, improve ROM, strength, and function.    Personal Factors and Comorbidities Age;Comorbidity 3+;Fitness;Time since onset of injury/illness/exacerbation    Comorbidities anxiety, depression, DM, HTN    Examination-Activity Limitations Bathing;Hygiene/Grooming;Lift;Reach Overhead;Toileting;Carry    Stability/Clinical Decision Making Stable/Uncomplicated    Rehab Potential Fair    Clinical Impairments Affecting Rehab Potential Possible difficulty adhering to precautions. Pt  needs constant reminder and cueing for PROM during the first few weeks of rehab.    PT Frequency 2x / week    PT Duration 12 weeks    PT Treatment/Interventions Therapeutic activities;Therapeutic exercise;Neuromuscular re-education;Patient/family education;Manual techniques;Passive range of motion;Dry needling;Vestibular;Canalith Repostioning;Electrical Stimulation;Iontophoresis 4mg /ml Dexamethasone    PT Next Visit Plan PROM, manual techniques, modalities PRN. [facility] Reverse Total Shoulder Replacement Protocol unless otherwise noted by MD.    Consulted and Agree with Plan of Care Patient           Patient will benefit from skilled therapeutic intervention in order to improve the following deficits and impairments:  Pain, Postural dysfunction, Improper body mechanics, Decreased strength, Decreased range of motion  Visit Diagnosis: Stiffness of left shoulder, not elsewhere classified  Chronic left shoulder pain  Muscle weakness (generalized)     Problem List Patient Active Problem List   Diagnosis Date Noted  . 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) 01/03/2019  . Chronic cough 11/19/2016  . Morbid obesity (HCC) 09/26/2016  . Left ovarian cyst 05/30/2016  . S/P lumbar spinal fusion 12/13/2015  . Aortic calcification (HCC) 07/18/2015  . Controlled type 2 diabetes mellitus without complication (HCC) 05/16/2015  . Thrombocytopenia (HCC) 05/16/2015  . Recurrent major depressive disorder, in full remission (HCC) 05/16/2015  . Essential (primary) hypertension 05/16/2015  . Fatty infiltration of liver 03/15/2015  . Aortic valve stenosis, nonrheumatic 01/18/2015  . Sciatica of right side 01/09/2015  . Type 2 diabetes mellitus (HCC) 11/10/2014  . RLS (restless legs syndrome) 08/23/2014  . Neuritis or radiculitis due to rupture of lumbar intervertebral disc  06/13/2014   . Degeneration of intervertebral disc of lumbar region 06/13/2014  . Lumbar radiculitis 06/13/2014  . Arthritis 01/23/2014  . Arthritis of knee, degenerative 11/15/2013  . Depression 09/29/2013  . Insomnia 09/29/2013  . Apnea, sleep 08/29/2013  . History of nephrolithiasis 08/29/2013  . Abnormal presence of protein in urine 08/29/2013  . Headache, migraine 08/29/2013  . BP (high blood pressure) 08/29/2013  . HLD (hyperlipidemia) 08/29/2013  . Allergic rhinitis 08/29/2013  . Intractable migraine without aura 02/18/2013    Loralyn Freshwater PT, DPT   04/03/2020, 4:08 PM  [place] [facility] PHYSICAL AND SPORTS MEDICINE [address] [place], [address] Phone: [phone]   Fax:  [phone]  Name: CALENA SALEM MRN: [id number] Date of Birth: 09-Jan-1951

## 2020-04-04 ENCOUNTER — Ambulatory Visit: Payer: Medicare HMO

## 2020-04-05 ENCOUNTER — Other Ambulatory Visit: Payer: Self-pay

## 2020-04-05 ENCOUNTER — Ambulatory Visit: Payer: Medicare HMO | Attending: Family Medicine

## 2020-04-05 DIAGNOSIS — G8929 Other chronic pain: Secondary | ICD-10-CM | POA: Diagnosis present

## 2020-04-05 DIAGNOSIS — M25551 Pain in right hip: Secondary | ICD-10-CM | POA: Insufficient documentation

## 2020-04-05 DIAGNOSIS — M6281 Muscle weakness (generalized): Secondary | ICD-10-CM | POA: Diagnosis present

## 2020-04-05 DIAGNOSIS — M5416 Radiculopathy, lumbar region: Secondary | ICD-10-CM | POA: Insufficient documentation

## 2020-04-05 DIAGNOSIS — M25512 Pain in left shoulder: Secondary | ICD-10-CM | POA: Diagnosis present

## 2020-04-05 DIAGNOSIS — M25612 Stiffness of left shoulder, not elsewhere classified: Secondary | ICD-10-CM | POA: Insufficient documentation

## 2020-04-05 DIAGNOSIS — M545 Low back pain, unspecified: Secondary | ICD-10-CM | POA: Insufficient documentation

## 2020-04-05 NOTE — Therapy (Signed)
[facility]ICINE [address], [place], [address] Phone: [phone]   Fax:  [phone]  Physical Therapy Treatment  Patient Details  Name: Jessica Cole MRN: [id number] Date of Birth: 11-01-1950 Referring Provider (PT): Anson [place], [place]   Encounter Date: 04/05/2020   PT End of Session - 04/05/20 1354    Visit Number 3    Number of Visits 25    Date for PT Re-Evaluation 06/21/20    Authorization Type 3    Authorization Time Period of 10 progress report    PT Start Time 1354    PT Stop Time 1432    PT Time Calculation (min) 38 min    Activity Tolerance Patient tolerated treatment well    Behavior During Therapy [facility] for tasks assessed/performed           Past Medical History:  Diagnosis Date  . Anxiety   . Asthma    seasonal with allergies  . Colon polyps   . Degenerative arthritis   . Depression   . Diabetes mellitus without complication (HCC)    Type II  . Environmental allergies   . Family history of adverse reaction to anesthesia    sister- PONV  . Fatty liver   . Glaucoma   . Headache(784.0)    HX  MIGRAINES  . History of bronchitis   . History of kidney stones   . History of pneumonia   . Hypertension   . Hypothyroidism   . Obesity   . OSA on CPAP   . Plantar fasciitis    right  . PONV (postoperative nausea and vomiting)   . Renal calculi   . Renal calculi   . RLS (restless legs syndrome) 08/23/2014    Past Surgical History:  Procedure Laterality Date  . ABDOMINAL HYSTERECTOMY     partial  . APPENDECTOMY    . Arthroscopic surgery, knee Left   . BREAST BIOPSY Right    several  . BREAST BIOPSY Right 03/11/2017   u/s bx neg  . BUNIONECTOMY     LEFT   . COLONOSCOPY W/ POLYPECTOMY    . COLONOSCOPY WITH PROPOFOL N/A 08/14/2017   Procedure: COLONOSCOPY WITH PROPOFOL;  Surgeon: Christena Deem, MD;  Location: [facility] ENDOSCOPY;  Service: Endoscopy;  Laterality: N/A;  .  EXTRACORPOREAL SHOCK WAVE LITHOTRIPSY Left 05/14/2017   Procedure: EXTRACORPOREAL SHOCK WAVE LITHOTRIPSY (ESWL);  Surgeon: Riki Altes, MD;  Location: [facility] ORS;  Service: Urology;  Laterality: Left;  . FOOT SURGERY     RIGHT    . KNEE ARTHROSCOPY W/ MENISCAL REPAIR Left   . LASIK    . MAXIMUM ACCESS (MAS)POSTERIOR LUMBAR INTERBODY FUSION (PLIF) 1 LEVEL N/A 04/25/2016   Procedure: LUMBAR FOUR-FIVE  MAXIMUM ACCESS (MAS) POSTERIOR LUMBAR INTERBODY FUSION (PLIF) with extension of instrumentation LUMBAR TWO-FIVE;  Surgeon: Tia Alert, MD;  Location: [facility] OR;  Service: Neurosurgery;  Laterality: N/A;  . MAXIMUM ACCESS (MAS)POSTERIOR LUMBAR INTERBODY FUSION (PLIF) 2 LEVEL N/A 12/13/2015   Procedure: Lumbar two-three - Lumbar three-four MAXIMUM ACCESS (MAS) POSTERIOR LUMBAR INTERBODY FUSION (PLIF)  ;  Surgeon: Tia Alert, MD;  Location: [facility] NEURO ORS;  Service: Neurosurgery;  Laterality: N/A;  . REVERSE SHOULDER ARTHROPLASTY Left 03/01/2020   Procedure: REVERSE SHOULDER ARTHROPLASTY;  Surgeon: Christena Flake, MD;  Location: [facility] ORS;  Service: Orthopedics;  Laterality: Left;  . SHOULDER SURGERY     RIGHT   . TONSILLECTOMY  There were no vitals filed for this visit.   Subjective Assessment - 04/05/20 1357    Subjective L shoulder is a little achey, kind of odd feeling. Thinks she left the sling off too long. Not realy pain, just the ache. Pt states not taking a pain pill secondary to it makes her sleepy. There was about a 3 week gap between home health PT and outpatient PT. Was not able to do her HEP as musch as she should.    Pertinent History S/P L reverse total shoulder replacment. Did not know about only PROM for now. Had home health PT for 2 weeks which involved PROM shoulder abduction, flexion as well as isometric shoulder flexion and abduction.  Pt sleeps on her bed.  Next surgeon appointment is either first or second week of December around Thurday of Friday.  Pt also states having  ringing in her ears which started since the surgery.    Patient Stated Goals Raise her L arm better, be able to pick up things as well as work on crafts.    Currently in Pain? No/denies    Pain Onset 1 to 4 weeks ago                                     PT Education - 04/05/20 1648    Education provided Yes    Education Details ther-ex    [employer]) Educated Patient    Methods Explanation;Demonstration;Tactile cues;Verbal cues    Comprehension Returned demonstration;Verbalized understanding          Objective  No latex band allergies  03/27/2020: Blood pressure R arm sitting, mechanically taken, normal cuff: 170/80, HR 59  Last home health session was about over a week ago  L shoulder incisions: healing, scab formation with slight redness around the scabs.   4.5 weeks post op   Medbridge Access Code [email or web address]    Manual therapy  Seated STM L upper trap muscle to decrease tension    Therapeutic exercise  Seated PROM L shoulder              Flexion 10x2             scaption 10x2             Abduction 10x2              Scapular plane: ER 10x2  Seated table slides PROM   Flexion 10x3  Reviewed and given as pert of her HEP. Pt demonstrated and verbalized understanding. Handout provided.   Improved exercise technique, movement at target joints, use of target muscles after mod verbal, visual, tactile cues.  Verbal and tactile cues also needed to decrease L shoulder muscle activation to promote PROM.     Response to treatment Fair tolerance to today's session.   Clinical impression Stiff with pain end feel. Pt was recommended to use the pain medication her surgeon prescribed her with her husband driving her secondary to her becoming sleepy when taking it to improve ability to perform PROM exercises. Added seated table slides flexion PROM (painfull when performed in scaption) as part of her HEP to help with ROM. Fair  tolerance to today's session. Gap of 3 weeks in PT treatment from home health to outpatient may play a factor in L shoulder joint stiffness. Pt will benefit from continued skilled physical therapy services to improve ROM, strength and function.  PT Short Term Goals - 03/27/20 1223      PT SHORT TERM GOAL #1   Title Pt will be independent with her initial HEP to improve ROM and ability to raise her arm with less difficulty when appropriate.    Time 3    Period Weeks    Status New    Target Date 04/19/20             PT Long Term Goals - 03/27/20 1225      PT LONG TERM GOAL #1   Title Patient will improve L shoulder PROM flexion, and abduction AROM to at least 155 degrees, and ER to 80 degrees to promote ability to reach with less difficulty when appropriate.    Baseline PROM flexion 70 degrees, abduction 58 degrees, ER 5 degrees (scapular plane) 03/27/2020.    Time 12    Period Weeks    Status New    Target Date 06/21/20      PT LONG TERM GOAL #2   Title Pt will have at least 145 degrees L shoulder flexion and abduction AROM, and 75 degrees ER AROM to promote ability to reach with less difficulty when appropriate.    Baseline AROM not yet performed (03/27/2020)    Time 12    Period Weeks    Status New    Target Date 06/21/20      PT LONG TERM GOAL #3   Title PT will improve her FOTO score by at least 20 points as a demonstration of improved function.    Baseline Shoulder FOTO: 34 (03/27/2020)    Time 12    Period Weeks    Status New    Target Date 06/21/20                 Plan - 04/05/20 1648    Clinical Impression Statement Stiff with pain end feel. Pt was recommended to use the pain medication her surgeon prescribed her with her husband driving her secondary to her becoming sleepy when taking it to improve ability to perform PROM exercises. Added seated table slides flexion PROM (painfull when performed in scaption) as part of her HEP to help with ROM. Fair  tolerance to today's session. Gap of 3 weeks in PT treatment from home health to outpatient may play a factor in L shoulder joint stiffness. Pt will benefit from continued skilled physical therapy services to improve ROM, strength and function.    Personal Factors and Comorbidities Age;Comorbidity 3+;Fitness;Time since onset of injury/illness/exacerbation    Comorbidities anxiety, depression, DM, HTN    Examination-Activity Limitations Bathing;Hygiene/Grooming;Lift;Reach Overhead;Toileting;Carry    Stability/Clinical Decision Making Stable/Uncomplicated    Rehab Potential Fair    Clinical Impairments Affecting Rehab Potential Possible difficulty adhering to precautions. Pt needs constant reminder and cueing for PROM during the first few weeks of rehab.    PT Frequency 2x / week    PT Duration 12 weeks    PT Treatment/Interventions Therapeutic activities;Therapeutic exercise;Neuromuscular re-education;Patient/family education;Manual techniques;Passive range of motion;Dry needling;Vestibular;Canalith Repostioning;Electrical Stimulation;Iontophoresis 4mg /ml Dexamethasone    PT Next Visit Plan PROM, manual techniques, modalities PRN. [facility] Reverse Total Shoulder Replacement Protocol unless otherwise noted by MD.    Consulted and Agree with Plan of Care Patient           Patient will benefit from skilled therapeutic intervention in order to improve the following deficits and impairments:  Pain, Postural dysfunction, Improper body mechanics, Decreased strength, Decreased range of motion  Visit Diagnosis:  Stiffness of left shoulder, not elsewhere classified  Chronic left shoulder pain     Problem List Patient Active Problem List   Diagnosis Date Noted  . 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) 01/03/2019  . Chronic cough 11/19/2016  . Morbid obesity (HCC) 09/26/2016  . Left  ovarian cyst 05/30/2016  . S/P lumbar spinal fusion 12/13/2015  . Aortic calcification (HCC) 07/18/2015  . Controlled type 2 diabetes mellitus without complication (HCC) 05/16/2015  . Thrombocytopenia (HCC) 05/16/2015  . Recurrent major depressive disorder, in full remission (HCC) 05/16/2015  . Essential (primary) hypertension 05/16/2015  . Fatty infiltration of liver 03/15/2015  . Aortic valve stenosis, nonrheumatic 01/18/2015  . Sciatica of right side 01/09/2015  . Type 2 diabetes mellitus (HCC) 11/10/2014  . RLS (restless legs syndrome) 08/23/2014  . Neuritis or radiculitis due to rupture of lumbar intervertebral disc 06/13/2014  . Degeneration of intervertebral disc of lumbar region 06/13/2014  . Lumbar radiculitis 06/13/2014  . Arthritis 01/23/2014  . Arthritis of knee, degenerative 11/15/2013  . Depression 09/29/2013  . Insomnia 09/29/2013  . Apnea, sleep 08/29/2013  . History of nephrolithiasis 08/29/2013  . Abnormal presence of protein in urine 08/29/2013  . Headache, migraine 08/29/2013  . BP (high blood pressure) 08/29/2013  . HLD (hyperlipidemia) 08/29/2013  . Allergic rhinitis 08/29/2013  . Intractable migraine without aura 02/18/2013    Loralyn FreshwaterMiguel Olene Godfrey PT, DPT   04/05/2020, 4:59 PM  [place] [facility] [address] [place], [place], [address] Phone: [phone]   Fax:  [phone]  Name: Jessica Cole MRN: [id number] Date of Birth: 04/28/1951

## 2020-04-09 ENCOUNTER — Ambulatory Visit: Payer: Medicare HMO

## 2020-04-09 ENCOUNTER — Other Ambulatory Visit: Payer: Self-pay

## 2020-04-09 DIAGNOSIS — M25512 Pain in left shoulder: Secondary | ICD-10-CM

## 2020-04-09 DIAGNOSIS — M25612 Stiffness of left shoulder, not elsewhere classified: Secondary | ICD-10-CM

## 2020-04-09 DIAGNOSIS — M6281 Muscle weakness (generalized): Secondary | ICD-10-CM

## 2020-04-09 DIAGNOSIS — G8929 Other chronic pain: Secondary | ICD-10-CM

## 2020-04-09 NOTE — Patient Instructions (Signed)
Added scaption to table slide PROM exercise at home as tolerated. Pt verbalized understanding.

## 2020-04-09 NOTE — Therapy (Signed)
[facility] [address] [place], [place], [address] Phone: [phone]   Fax:  [phone]  Physical Therapy Treatment  Patient Details  Name: Jessica PutnamGail T Bonenfant MRN: [id number] Date of Birth: 05/13/1950 Referring Provider (PT): Anson [place] Lance [place], [place]   Encounter Date: 04/09/2020   PT End of Session - 04/09/20 1631    Visit Number 4    Number of Visits 25    Date for PT Re-Evaluation 06/21/20    Authorization Type 4    Authorization Time Period of 10 progress report    PT Start Time 1631   pt arrived late   PT Stop Time 1659    PT Time Calculation (min) 28 min    Activity Tolerance Patient tolerated treatment well    Behavior During Therapy [facility] for tasks assessed/performed           Past Medical History:  Diagnosis Date  . Anxiety   . Asthma    seasonal with allergies  . Colon polyps   . Degenerative arthritis   . Depression   . Diabetes mellitus without complication (HCC)    Type II  . Environmental allergies   . Family history of adverse reaction to anesthesia    sister- PONV  . Fatty liver   . Glaucoma   . Headache(784.0)    HX  MIGRAINES  . History of bronchitis   . History of kidney stones   . History of pneumonia   . Hypertension   . Hypothyroidism   . Obesity   . OSA on CPAP   . Plantar fasciitis    right  . PONV (postoperative nausea and vomiting)   . Renal calculi   . Renal calculi   . RLS (restless legs syndrome) 08/23/2014    Past Surgical History:  Procedure Laterality Date  . ABDOMINAL HYSTERECTOMY     partial  . APPENDECTOMY    . Arthroscopic surgery, knee Left   . BREAST BIOPSY Right    several  . BREAST BIOPSY Right 03/11/2017   u/s bx neg  . BUNIONECTOMY     LEFT   . COLONOSCOPY W/ POLYPECTOMY    . COLONOSCOPY WITH PROPOFOL N/A 08/14/2017   Procedure: COLONOSCOPY WITH PROPOFOL;  Surgeon: Christena DeemSkulskie, Martin U, MD;  [facility];  Service: Endoscopy;   Laterality: N/A;  . EXTRACORPOREAL SHOCK WAVE LITHOTRIPSY Left 05/14/2017   Procedure: EXTRACORPOREAL SHOCK WAVE LITHOTRIPSY (ESWL);  Surgeon: Riki AltesStoioff, Scott C, MD;  Location: [facility] ORS;  Service: Urology;  Laterality: Left;  . FOOT SURGERY     RIGHT    . KNEE ARTHROSCOPY W/ MENISCAL REPAIR Left   . LASIK    . MAXIMUM ACCESS (MAS)POSTERIOR LUMBAR INTERBODY FUSION (PLIF) 1 LEVEL N/A 04/25/2016   Procedure: LUMBAR FOUR-FIVE  MAXIMUM ACCESS (MAS) POSTERIOR LUMBAR INTERBODY FUSION (PLIF) with extension of instrumentation LUMBAR TWO-FIVE;  Surgeon: Tia Alertavid S Jones, MD;  [facility];  Service: Neurosurgery;  Laterality: N/A;  . MAXIMUM ACCESS (MAS)POSTERIOR LUMBAR INTERBODY FUSION (PLIF) 2 LEVEL N/A 12/13/2015   Procedure: Lumbar two-three - Lumbar three-four MAXIMUM ACCESS (MAS) POSTERIOR LUMBAR INTERBODY FUSION (PLIF)  ;  Surgeon: Tia Alertavid S Jones, MD;  [facility];  Service: Neurosurgery;  Laterality: N/A;  . REVERSE SHOULDER ARTHROPLASTY Left 03/01/2020   Procedure: REVERSE SHOULDER ARTHROPLASTY;  Surgeon: Christena FlakePoggi, John J, MD;  Location: [facility] ORS;  Service: Orthopedics;  Laterality: Left;  . SHOULDER SURGERY     RIGHT   .  TONSILLECTOMY      There were no vitals filed for this visit.   Subjective Assessment - 04/09/20 1633    Subjective L shoulder gets a twinge when she moves it, not really a hurt. Has been putting ice pack on it.    Pertinent History S/P L reverse total shoulder replacment. Did not know about only PROM for now. Had home health PT for 2 weeks which involved PROM shoulder abduction, flexion as well as isometric shoulder flexion and abduction.  Pt sleeps on her bed.  Next surgeon appointment is either first or second week of December around Thurday of Friday.  Pt also states having ringing in her ears which started since the surgery.    Patient Stated Goals Raise her L arm better, be able to pick up things as well as work on crafts.    Currently in Pain? No/denies    Pain  Onset 1 to 4 weeks ago                                   Objective  No latex band allergies  03/27/2020: Blood pressure R arm sitting, mechanically taken, normal cuff: 170/80, HR 59  Last home health session was about over a week ago  L shoulder incisions: healing, scab formation with slight redness around the scabs.  5.5 weeks post op   MedbridgeAccess Code [email or web address]    Manual therapy  Seated STM L upper trap muscle to decrease tension    Therapeutic exercise   Seated table slides PROM              Flexion 10x3  scaption 10x3  Seated PROM L shoulder  Flexion 10x scaption 10x Abduction 10x  Scapular plane: ER 10x3               BP L arm sitting, mechanically taken, normal cuff: 167/69, HR 58  Taken secondary to migraine    Improved exercise technique, movement at target joints, use of target muscles after mod verbal, visual, tactile cues.  Verbal and tactile cues also needed to decrease L shoulder muscle activation to promote PROM.     Response to treatment Fair tolerance to today's session.    Clinical impression Pt arrived late so session was adjusted accordingly. Continued working on improving L shoulder PROM to decrease stiffness. Painful and stiff end feel. Cues needed to relax muscles to promote PROM. Pt demonstrates tendency for muscle guarding. Pt was recommended to continue performing her table slides at home (can also try scaption) to help with ROM. Pt will benefit from continued skilled physical therapy services to decrease stiffness and improve ROM to decrease difficulty raising her arm when appropriate.     PT Education - 04/09/20 1707    Education provided Yes    Education Details ther-ex    [employer]) Educated Patient    Methods Explanation;Demonstration;Tactile cues;Verbal cues    Comprehension Returned demonstration;Verbalized  understanding            PT Short Term Goals - 03/27/20 1223      PT SHORT TERM GOAL #1   Title Pt will be independent with her initial HEP to improve ROM and ability to raise her arm with less difficulty when appropriate.    Time 3    Period Weeks    Status New    Target Date 04/19/20  PT Long Term Goals - 03/27/20 1225      PT LONG TERM GOAL #1   Title Patient will improve L shoulder PROM flexion, and abduction AROM to at least 155 degrees, and ER to 80 degrees to promote ability to reach with less difficulty when appropriate.    Baseline PROM flexion 70 degrees, abduction 58 degrees, ER 5 degrees (scapular plane) 03/27/2020.    Time 12    Period Weeks    Status New    Target Date 06/21/20      PT LONG TERM GOAL #2   Title Pt will have at least 145 degrees L shoulder flexion and abduction AROM, and 75 degrees ER AROM to promote ability to reach with less difficulty when appropriate.    Baseline AROM not yet performed (03/27/2020)    Time 12    Period Weeks    Status New    Target Date 06/21/20      PT LONG TERM GOAL #3   Title PT will improve her FOTO score by at least 20 points as a demonstration of improved function.    Baseline Shoulder FOTO: 34 (03/27/2020)    Time 12    Period Weeks    Status New    Target Date 06/21/20                 Plan - 04/09/20 1709    Clinical Impression Statement Pt arrived late so session was adjusted accordingly. Continued working on improving L shoulder PROM to decrease stiffness. Painful and stiff end feel. Cues needed to relax muscles to promote PROM. Pt demonstrates tendency for muscle guarding. Pt was recommended to continue performing her table slides at home (can also try scaption) to help with ROM. Pt will benefit from continued skilled physical therapy services to decrease stiffness and improve ROM to decrease difficulty raising her arm when appropriate.    Personal Factors and Comorbidities  Age;Comorbidity 3+;Fitness;Time since onset of injury/illness/exacerbation    Comorbidities anxiety, depression, DM, HTN    Examination-Activity Limitations Bathing;Hygiene/Grooming;Lift;Reach Overhead;Toileting;Carry    Stability/Clinical Decision Making Stable/Uncomplicated    Rehab Potential Fair    Clinical Impairments Affecting Rehab Potential Possible difficulty adhering to precautions. Pt needs constant reminder and cueing for PROM during the first few weeks of rehab.    PT Frequency 2x / week    PT Duration 12 weeks    PT Treatment/Interventions Therapeutic activities;Therapeutic exercise;Neuromuscular re-education;Patient/family education;Manual techniques;Passive range of motion;Dry needling;Vestibular;Canalith Repostioning;Electrical Stimulation;Iontophoresis 4mg /ml Dexamethasone    PT Next Visit Plan PROM, manual techniques, modalities PRN. [facility] Reverse Total Shoulder Replacement Protocol unless otherwise noted by MD.    Consulted and Agree with Plan of Care Patient           Patient will benefit from skilled therapeutic intervention in order to improve the following deficits and impairments:  Pain, Postural dysfunction, Improper body mechanics, Decreased strength, Decreased range of motion  Visit Diagnosis: Stiffness of left shoulder, not elsewhere classified  Chronic left shoulder pain  Muscle weakness (generalized)     Problem List Patient Active Problem List   Diagnosis Date Noted  . 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) 01/03/2019  . Chronic cough 11/19/2016  . Morbid obesity (HCC) 09/26/2016  . Left ovarian cyst 05/30/2016  . S/P lumbar spinal fusion 12/13/2015  . Aortic calcification (HCC) 07/18/2015  . Controlled type 2 diabetes mellitus without complication (HCC) 05/16/2015  .  Thrombocytopenia (HCC) 05/16/2015  . Recurrent major  depressive disorder, in full remission (HCC) 05/16/2015  . Essential (primary) hypertension 05/16/2015  . Fatty infiltration of liver 03/15/2015  . Aortic valve stenosis, nonrheumatic 01/18/2015  . Sciatica of right side 01/09/2015  . Type 2 diabetes mellitus (HCC) 11/10/2014  . RLS (restless legs syndrome) 08/23/2014  . Neuritis or radiculitis due to rupture of lumbar intervertebral disc 06/13/2014  . Degeneration of intervertebral disc of lumbar region 06/13/2014  . Lumbar radiculitis 06/13/2014  . Arthritis 01/23/2014  . Arthritis of knee, degenerative 11/15/2013  . Depression 09/29/2013  . Insomnia 09/29/2013  . Apnea, sleep 08/29/2013  . History of nephrolithiasis 08/29/2013  . Abnormal presence of protein in urine 08/29/2013  . Headache, migraine 08/29/2013  . BP (high blood pressure) 08/29/2013  . HLD (hyperlipidemia) 08/29/2013  . Allergic rhinitis 08/29/2013  . Intractable migraine without aura 02/18/2013    Loralyn Freshwater PT, DPT   04/09/2020, 5:12 PM  [place] [facility] PHYSICAL AND SPORTS MEDICINE [address] [place], [address] Phone: [phone]   Fax:  [phone]  Name: ORETA SOLOWAY MRN: [id number] Date of Birth: [birth year]

## 2020-04-11 ENCOUNTER — Ambulatory Visit (INDEPENDENT_AMBULATORY_CARE_PROVIDER_SITE_OTHER): Payer: Medicare HMO | Admitting: Podiatry

## 2020-04-11 ENCOUNTER — Other Ambulatory Visit: Payer: Self-pay

## 2020-04-11 ENCOUNTER — Encounter: Payer: Self-pay | Admitting: Podiatry

## 2020-04-11 DIAGNOSIS — M7751 Other enthesopathy of right foot: Secondary | ICD-10-CM | POA: Diagnosis not present

## 2020-04-11 DIAGNOSIS — B351 Tinea unguium: Secondary | ICD-10-CM | POA: Diagnosis not present

## 2020-04-11 DIAGNOSIS — M79676 Pain in unspecified toe(s): Secondary | ICD-10-CM | POA: Diagnosis not present

## 2020-04-11 MED ORDER — DEXAMETHASONE SODIUM PHOSPHATE 120 MG/30ML IJ SOLN
2.0000 mg | Freq: Once | INTRAMUSCULAR | Status: AC
  Administered 2020-04-11: 2 mg via INTRA_ARTICULAR

## 2020-04-11 NOTE — Progress Notes (Signed)
She presents today with chief complaint of painfully elongated toenails as well as a painful fifth metatarsal head of her right foot.  She points to the area and states that is been rubbing my shoes and is really painful.  She denies any changes in her shoe gear and denies any trauma.  Objective: Vital signs are stable alert and oriented x3.  Pulses are palpable.  Neurologic sensorium is slightly diminished per Semmes Weinstein monofilament consistent with diabetic peripheral neuropathy distally no open lesions or wounds are noted.  Toenails are long thick yellow dystrophic and clinically mycotic she has mild erythema overlying the fifth metatarsal head of the right foot with mild tenderness.  Assessment: Diabetic peripheral neuropathy pain in limb secondary to onychomycosis and peripheral neuropathy.  Bursitis capsulitis of the fifth metatarsophalangeal joint right foot.  Plan: I injected the right foot today with 2 mg of dexamethasone to the point of maximal tenderness.  Alleviating her symptoms immediately.  I also debrided toenails 1 through 5 bilaterally.

## 2020-04-12 ENCOUNTER — Ambulatory Visit: Payer: Medicare HMO

## 2020-04-12 DIAGNOSIS — M545 Low back pain, unspecified: Secondary | ICD-10-CM

## 2020-04-12 DIAGNOSIS — G8929 Other chronic pain: Secondary | ICD-10-CM

## 2020-04-12 DIAGNOSIS — M25551 Pain in right hip: Secondary | ICD-10-CM

## 2020-04-12 DIAGNOSIS — M25612 Stiffness of left shoulder, not elsewhere classified: Secondary | ICD-10-CM | POA: Diagnosis not present

## 2020-04-12 DIAGNOSIS — M6281 Muscle weakness (generalized): Secondary | ICD-10-CM

## 2020-04-12 DIAGNOSIS — M5416 Radiculopathy, lumbar region: Secondary | ICD-10-CM

## 2020-04-12 NOTE — Therapy (Signed)
[facility]ICINE [address] [place], [place], [address] Phone: [phone]   Fax:  [phone]  Physical Therapy Treatment  Patient Details  Name: Jessica Cole MRN: [id number] Date of Birth: June 17, 1950 Referring Provider (PT): Anson Oregon[place] Lance McGhee, [place]   Encounter Date: 04/12/2020   PT End of Session - 04/12/20 1612    Visit Number 5    Number of Visits 25    Date for PT Re-Evaluation 06/21/20    Authorization Type 5    Authorization Time Period of 10 progress report    PT Start Time 1615    PT Stop Time 1654    PT Time Calculation (min) 39 min    Activity Tolerance Patient tolerated treatment well    Behavior During Therapy [facility] for tasks assessed/performed           Past Medical History:  Diagnosis Date  . Anxiety   . Asthma    seasonal with allergies  . Colon polyps   . Degenerative arthritis   . Depression   . Diabetes mellitus without complication (HCC)    Type II  . Environmental allergies   . Family history of adverse reaction to anesthesia    sister- PONV  . Fatty liver   . Glaucoma   . Headache(784.0)    HX  MIGRAINES  . History of bronchitis   . History of kidney stones   . History of pneumonia   . Hypertension   . Hypothyroidism   . Obesity   . OSA on CPAP   . Plantar fasciitis    right  . PONV (postoperative nausea and vomiting)   . Renal calculi   . Renal calculi   . RLS (restless legs syndrome) 08/23/2014    Past Surgical History:  Procedure Laterality Date  . ABDOMINAL HYSTERECTOMY     partial  . APPENDECTOMY    . Arthroscopic surgery, knee Left   . BREAST BIOPSY Right    several  . BREAST BIOPSY Right 03/11/2017   u/s bx neg  . BUNIONECTOMY     LEFT   . COLONOSCOPY W/ POLYPECTOMY    . COLONOSCOPY WITH PROPOFOL N/A 08/14/2017   Procedure: COLONOSCOPY WITH PROPOFOL;  Surgeon: Christena DeemSkulskie, Martin U, MD;  [facility];  Service: Endoscopy;  Laterality: N/A;  .  EXTRACORPOREAL SHOCK WAVE LITHOTRIPSY Left 05/14/2017   Procedure: EXTRACORPOREAL SHOCK WAVE LITHOTRIPSY (ESWL);  Surgeon: Riki AltesStoioff, Scott C, MD;  Location: [facility] ORS;  Service: Urology;  Laterality: Left;  . FOOT SURGERY     RIGHT    . KNEE ARTHROSCOPY W/ MENISCAL REPAIR Left   . LASIK    . MAXIMUM ACCESS (MAS)POSTERIOR LUMBAR INTERBODY FUSION (PLIF) 1 LEVEL N/A 04/25/2016   Procedure: LUMBAR FOUR-FIVE  MAXIMUM ACCESS (MAS) POSTERIOR LUMBAR INTERBODY FUSION (PLIF) with extension of instrumentation LUMBAR TWO-FIVE;  Surgeon: Tia Alertavid S Jones, MD;  [facility];  Service: Neurosurgery;  Laterality: N/A;  . MAXIMUM ACCESS (MAS)POSTERIOR LUMBAR INTERBODY FUSION (PLIF) 2 LEVEL N/A 12/13/2015   Procedure: Lumbar two-three - Lumbar three-four MAXIMUM ACCESS (MAS) POSTERIOR LUMBAR INTERBODY FUSION (PLIF)  ;  Surgeon: Tia Alertavid S Jones, MD;  [facility];  Service: Neurosurgery;  Laterality: N/A;  . REVERSE SHOULDER ARTHROPLASTY Left 03/01/2020   Procedure: REVERSE SHOULDER ARTHROPLASTY;  Surgeon: Christena FlakePoggi, John J, MD;  Location: [facility] ORS;  Service: Orthopedics;  Laterality: Left;  . SHOULDER SURGERY     RIGHT   . TONSILLECTOMY  There were no vitals filed for this visit.   Subjective Assessment - 04/12/20 1616    Subjective Patient reported more pain last night, didn't sleep well. Pt reported 1/10 pain in L shoulder    Pertinent History S/P L reverse total shoulder replacment. Did not know about only PROM for now. Had home health PT for 2 weeks which involved PROM shoulder abduction, flexion as well as isometric shoulder flexion and abduction.  Pt sleeps on her bed.  Next surgeon appointment is either first or second week of December around Thurday of Friday.  Pt also states having ringing in her ears which started since the surgery.    Patient Stated Goals Raise her L arm better, be able to pick up things as well as work on crafts.    Currently in Pain? Yes    Pain Score 1     Pain Location  Shoulder    Pain Orientation Left    Pain Descriptors / Indicators Aching    Pain Onset 1 to 4 weeks ago              Objective      No latex band allergies     Last home health session was about over a week ago   L shoulder incisions: healing, scab formation with slight redness around the scabs.     6 weeks post op  Medbridge Access Code [email or web address]    Manual therapy   Seated STM L upper trap muscle to decrease tension     Therapeutic exercise   Seated table slides PROM                Flexion 10x3               scaption 10x3   Seated PROM L shoulder                Flexion 10x3               scaption 10x3               Abduction 10x3               Scapular plane: ER 10x3  Verbal and tactile cues also needed to decrease L shoulder muscle activation to promote PROM.         Pt response/clinical impression: Pt still exhibited difficulty with muscle guarding; difficulty relaxing to allow for true PROM. The patient also reported pain especially with ER to neutral during session. Pt with improved tissue relaxation after STM. The patient would benefit from further skilled PT intervention to continue to progress towards goals and PLOF per protocol.           PT Education - 04/12/20 1612    Education provided Yes    Education Details therex    [employer]) Educated Patient    Methods Explanation;Demonstration;Tactile cues;Verbal cues    Comprehension Verbalized understanding;Returned demonstration            PT Short Term Goals - 03/27/20 1223      PT SHORT TERM GOAL #1   Title Pt will be independent with her initial HEP to improve ROM and ability to raise her arm with less difficulty when appropriate.    Time 3    Period Weeks    Status New    Target Date 04/19/20             PT Long Term Goals - 03/27/20 1225  PT LONG TERM GOAL #1   Title Patient will improve L shoulder PROM flexion, and abduction AROM to at least 155 degrees, and ER to  80 degrees to promote ability to reach with less difficulty when appropriate.    Baseline PROM flexion 70 degrees, abduction 58 degrees, ER 5 degrees (scapular plane) 03/27/2020.    Time 12    Period Weeks    Status New    Target Date 06/21/20      PT LONG TERM GOAL #2   Title Pt will have at least 145 degrees L shoulder flexion and abduction AROM, and 75 degrees ER AROM to promote ability to reach with less difficulty when appropriate.    Baseline AROM not yet performed (03/27/2020)    Time 12    Period Weeks    Status New    Target Date 06/21/20      PT LONG TERM GOAL #3   Title PT will improve her FOTO score by at least 20 points as a demonstration of improved function.    Baseline Shoulder FOTO: 34 (03/27/2020)    Time 12    Period Weeks    Status New    Target Date 06/21/20                 Plan - 04/12/20 1612    Clinical Impression Statement Pt still exhibited difficulty with muscle guarding; difficulty relaxing to allow for true PROM. The patient also reported pain especially with ER to neutral during session. Pt with improved tissue relaxation after STM. The patient would benefit from further skilled PT intervention to continue to progress towards goals and PLOF per protocol.    Personal Factors and Comorbidities Age;Comorbidity 3+;Fitness;Time since onset of injury/illness/exacerbation    Comorbidities anxiety, depression, DM, HTN    Examination-Activity Limitations Bathing;Hygiene/Grooming;Lift;Reach Overhead;Toileting;Carry    Stability/Clinical Decision Making Stable/Uncomplicated    Rehab Potential Fair    Clinical Impairments Affecting Rehab Potential Possible difficulty adhering to precautions. Pt needs constant reminder and cueing for PROM during the first few weeks of rehab.    PT Frequency 2x / week    PT Duration 12 weeks    PT Treatment/Interventions Therapeutic activities;Therapeutic exercise;Neuromuscular re-education;Patient/family education;Manual  techniques;Passive range of motion;Dry needling;Vestibular;Canalith Repostioning;Electrical Stimulation;Iontophoresis 4mg /ml Dexamethasone    PT Next Visit Plan PROM, manual techniques, modalities PRN. [facility] Reverse Total Shoulder Replacement Protocol unless otherwise noted by MD.    Consulted and Agree with Plan of Care Patient           Patient will benefit from skilled therapeutic intervention in order to improve the following deficits and impairments:  Pain,Postural dysfunction,Improper body mechanics,Decreased strength,Decreased range of motion  Visit Diagnosis: Stiffness of left shoulder, not elsewhere classified  Chronic left shoulder pain  Muscle weakness (generalized)  Chronic bilateral low back pain, unspecified whether sciatica present  Radiculopathy, lumbar region  Low back pain, unspecified back pain laterality, unspecified chronicity, unspecified whether sciatica present  Pain in right hip     Problem List Patient Active Problem List   Diagnosis Date Noted  . 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) 01/03/2019  . Chronic cough 11/19/2016  . Morbid obesity (HCC) 09/26/2016  . Left ovarian cyst 05/30/2016  . S/P lumbar spinal fusion 12/13/2015  . Aortic calcification (HCC) 07/18/2015  . Controlled type 2 diabetes mellitus without complication (HCC) 05/16/2015  . Thrombocytopenia (HCC) 05/16/2015  . Recurrent major depressive disorder,  in full remission (HCC) 05/16/2015  . Essential (primary) hypertension 05/16/2015  . Fatty infiltration of liver 03/15/2015  . Aortic valve stenosis, nonrheumatic 01/18/2015  . Sciatica of right side 01/09/2015  . Type 2 diabetes mellitus (HCC) 11/10/2014  . RLS (restless legs syndrome) 08/23/2014  . Neuritis or radiculitis due to rupture of lumbar intervertebral disc 06/13/2014  . Degeneration of intervertebral  disc of lumbar region 06/13/2014  . Lumbar radiculitis 06/13/2014  . Arthritis 01/23/2014  . Arthritis of knee, degenerative 11/15/2013  . Depression 09/29/2013  . Insomnia 09/29/2013  . Apnea, sleep 08/29/2013  . History of nephrolithiasis 08/29/2013  . Abnormal presence of protein in urine 08/29/2013  . Headache, migraine 08/29/2013  . BP (high blood pressure) 08/29/2013  . HLD (hyperlipidemia) 08/29/2013  . Allergic rhinitis 08/29/2013  . Intractable migraine without aura 02/18/2013    Olga Coaster PT, DPT 4:55 PM,04/12/20   [facility] [address] [place], [address] Phone: [phone]   Fax:  [phone]  Name: Jessica Cole MRN: [id number] Date of Birth: 08-24-1950

## 2020-04-16 ENCOUNTER — Ambulatory Visit: Payer: Medicare HMO

## 2020-04-16 ENCOUNTER — Other Ambulatory Visit: Payer: Self-pay

## 2020-04-16 DIAGNOSIS — M25512 Pain in left shoulder: Secondary | ICD-10-CM

## 2020-04-16 DIAGNOSIS — M25612 Stiffness of left shoulder, not elsewhere classified: Secondary | ICD-10-CM | POA: Diagnosis not present

## 2020-04-16 NOTE — Therapy (Signed)
[facility] [address] [place], [place], [address] Phone: [phone]   Fax:  [phone]  Physical Therapy Treatment  Patient Details  Name: Jessica Cole MRN: [id number] Date of Birth: 12/18/1950 Referring Provider (PT): Anson [place] Lance McGhee, [place]   Encounter Date: 04/16/2020   PT End of Session - 04/16/20 1620    Visit Number 6    Number of Visits 25    Date for PT Re-Evaluation 06/21/20    Authorization Type 6    Authorization Time Period of 10 progress report    PT Start Time 1620    PT Stop Time 1701    PT Time Calculation (min) 41 min    Activity Tolerance Patient tolerated treatment well    Behavior During Therapy [facility] for tasks assessed/performed           Past Medical History:  Diagnosis Date  . Anxiety   . Asthma    seasonal with allergies  . Colon polyps   . Degenerative arthritis   . Depression   . Diabetes mellitus without complication (HCC)    Type II  . Environmental allergies   . Family history of adverse reaction to anesthesia    sister- PONV  . Fatty liver   . Glaucoma   . Headache(784.0)    HX  MIGRAINES  . History of bronchitis   . History of kidney stones   . History of pneumonia   . Hypertension   . Hypothyroidism   . Obesity   . OSA on CPAP   . Plantar fasciitis    right  . PONV (postoperative nausea and vomiting)   . Renal calculi   . Renal calculi   . RLS (restless legs syndrome) 08/23/2014    Past Surgical History:  Procedure Laterality Date  . ABDOMINAL HYSTERECTOMY     partial  . APPENDECTOMY    . Arthroscopic surgery, knee Left   . BREAST BIOPSY Right    several  . BREAST BIOPSY Right 03/11/2017   u/s bx neg  . BUNIONECTOMY     LEFT   . COLONOSCOPY W/ POLYPECTOMY    . COLONOSCOPY WITH PROPOFOL N/A 08/14/2017   Procedure: COLONOSCOPY WITH PROPOFOL;  Surgeon: Christena DeemSkulskie, Martin U, MD;  [facility];  Service: Endoscopy;  Laterality: N/A;  .  EXTRACORPOREAL SHOCK WAVE LITHOTRIPSY Left 05/14/2017   Procedure: EXTRACORPOREAL SHOCK WAVE LITHOTRIPSY (ESWL);  Surgeon: Riki AltesStoioff, Scott C, MD;  Location: [facility] ORS;  Service: Urology;  Laterality: Left;  . FOOT SURGERY     RIGHT    . KNEE ARTHROSCOPY W/ MENISCAL REPAIR Left   . LASIK    . MAXIMUM ACCESS (MAS)POSTERIOR LUMBAR INTERBODY FUSION (PLIF) 1 LEVEL N/A 04/25/2016   Procedure: LUMBAR FOUR-FIVE  MAXIMUM ACCESS (MAS) POSTERIOR LUMBAR INTERBODY FUSION (PLIF) with extension of instrumentation LUMBAR TWO-FIVE;  Surgeon: Tia Alertavid S Jones, MD;  [facility];  Service: Neurosurgery;  Laterality: N/A;  . MAXIMUM ACCESS (MAS)POSTERIOR LUMBAR INTERBODY FUSION (PLIF) 2 LEVEL N/A 12/13/2015   Procedure: Lumbar two-three - Lumbar three-four MAXIMUM ACCESS (MAS) POSTERIOR LUMBAR INTERBODY FUSION (PLIF)  ;  Surgeon: Tia Alertavid S Jones, MD;  [facility];  Service: Neurosurgery;  Laterality: N/A;  . REVERSE SHOULDER ARTHROPLASTY Left 03/01/2020   Procedure: REVERSE SHOULDER ARTHROPLASTY;  Surgeon: Christena FlakePoggi, John J, MD;  Location: [facility] ORS;  Service: Orthopedics;  Laterality: Left;  . SHOULDER SURGERY     RIGHT   . TONSILLECTOMY  There were no vitals filed for this visit.   Subjective Assessment - 04/16/20 1621    Subjective L shoulder is pretty good. Uses her sling only wiht crowds now. Has a little soreness L forearm. No L shoulder pain. Doctor seemed to be very happy with her shoulder. Everything seems to be healing good.    Pertinent History S/P L reverse total shoulder replacment. Did not know about only PROM for now. Had home health PT for 2 weeks which involved PROM shoulder abduction, flexion as well as isometric shoulder flexion and abduction.  Pt sleeps on her bed.  Next surgeon appointment is either first or second week of December around Thurday of Friday.  Pt also states having ringing in her ears which started since the surgery.    Patient Stated Goals Raise her L arm better, be able to  pick up things as well as work on crafts.    Currently in Pain? No/denies    Pain Onset More than a month ago                                     PT Education - 04/16/20 1637    Education provided Yes    Education Details ther-ex. HEP    Person(s) Educated Patient    Methods Explanation;Demonstration;Tactile cues;Verbal cues;Handout    Comprehension Returned demonstration;Verbalized understanding           Objective  No latex band allergies  03/27/2020: Blood pressure R arm sitting, mechanically taken, normal cuff: 170/80, HR 59  Last home health session was about over a week ago  L shoulder incisions: healing, scab formation with slight redness around the scabs.  6 weeks post op   MedbridgeAccess Code [email or web address]    Manual therapy  Seated STM L upper trap muscle to decrease tension   Therapeutic exercise  Reclined AAROM L shoulder  Flexion 10x3 using R UE   scaption with PVC rod with R UE assist 10x3   ER in scapular plate 40J8   Reviewed and given aforementinoed exercises at part pf her HEP. Pt demonstrated and verbalized understanding. Handout provided.    L shoulder abduction AAROM with PT reclined 10x3   ER in scapular plane with PT 10x3   seated B scapular retraction 10x3 with 5 second holds     Improved exercise technique, movement at target joints, use of target muscles after mod verbal, visual, tactile cues.    Response to treatment Pt tolerated session well without aggravagtion of symptoms.    Clinical impression Improved tolerance for shoulder ROM exercises to day with minimal to no reports of discomfort. Added AAROM L shoulder flexion, scaption, and ER (in scapular plane) in recliner as part of her HEP to improve ROM. Pt tolerated session well without aggravation of symptoms. Slight improvement in ROM observed. Pt will benefit from continued skilled physical therapy services  to decrease stiffness, improve ROM, strength and function.       PT Short Term Goals - 03/27/20 1223      PT SHORT TERM GOAL #1   Title Pt will be independent with her initial HEP to improve ROM and ability to raise her arm with less difficulty when appropriate.    Time 3    Period Weeks    Status New    Target Date 04/19/20             PT Long Term  Goals - 03/27/20 1225      PT LONG TERM GOAL #1   Title Patient will improve L shoulder PROM flexion, and abduction AROM to at least 155 degrees, and ER to 80 degrees to promote ability to reach with less difficulty when appropriate.    Baseline PROM flexion 70 degrees, abduction 58 degrees, ER 5 degrees (scapular plane) 03/27/2020.    Time 12    Period Weeks    Status New    Target Date 06/21/20      PT LONG TERM GOAL #2   Title Pt will have at least 145 degrees L shoulder flexion and abduction AROM, and 75 degrees ER AROM to promote ability to reach with less difficulty when appropriate.    Baseline AROM not yet performed (03/27/2020)    Time 12    Period Weeks    Status New    Target Date 06/21/20      PT LONG TERM GOAL #3   Title PT will improve her FOTO score by at least 20 points as a demonstration of improved function.    Baseline Shoulder FOTO: 34 (03/27/2020)    Time 12    Period Weeks    Status New    Target Date 06/21/20                 Plan - 04/16/20 1845    Clinical Impression Statement Improved tolerance for shoulder ROM exercises to day with minimal to no reports of discomfort. Added AAROM L shoulder flexion, scaption, and ER (in scapular plane) in recliner as part of her HEP to improve ROM. Pt tolerated session well without aggravation of symptoms. Slight improvement in ROM observed. Pt will benefit from continued skilled physical therapy services to decrease stiffness, improve ROM, strength and function.    Personal Factors and Comorbidities Age;Comorbidity 3+;Fitness;Time since onset of  injury/illness/exacerbation    Comorbidities anxiety, depression, DM, HTN    Examination-Activity Limitations Bathing;Hygiene/Grooming;Lift;Reach Overhead;Toileting;Carry    Stability/Clinical Decision Making Stable/Uncomplicated    Clinical Decision Making Low    Rehab Potential Fair    Clinical Impairments Affecting Rehab Potential Possible difficulty adhering to precautions. Pt needs constant reminder and cueing for PROM during the first few weeks of rehab.    PT Frequency 2x / week    PT Duration 12 weeks    PT Treatment/Interventions Therapeutic activities;Therapeutic exercise;Neuromuscular re-education;Patient/family education;Manual techniques;Passive range of motion;Dry needling;Vestibular;Canalith Repostioning;Electrical Stimulation;Iontophoresis 4mg /ml Dexamethasone    PT Next Visit Plan PROM, manual techniques, modalities PRN. [facility] Reverse Total Shoulder Replacement Protocol unless otherwise noted by MD.    Consulted and Agree with Plan of Care Patient           Patient will benefit from skilled therapeutic intervention in order to improve the following deficits and impairments:  Pain,Postural dysfunction,Improper body mechanics,Decreased strength,Decreased range of motion  Visit Diagnosis: Stiffness of left shoulder, not elsewhere classified  Chronic left shoulder pain     Problem List Patient Active Problem List   Diagnosis Date Noted  . 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) 01/03/2019  . Chronic cough 11/19/2016  . Morbid obesity (HCC) 09/26/2016  . Left ovarian cyst 05/30/2016  . S/P lumbar spinal fusion 12/13/2015  . Aortic calcification (HCC) 07/18/2015  . Controlled type 2 diabetes mellitus without complication (HCC) 05/16/2015  . Thrombocytopenia (HCC) 05/16/2015  . Recurrent major depressive disorder, in full remission (HCC) 05/16/2015  .  Essential (primary) hypertension 05/16/2015  . Fatty infiltration of liver 03/15/2015  . Aortic valve stenosis, nonrheumatic 01/18/2015  . Sciatica of right side 01/09/2015  . Type 2 diabetes mellitus (HCC) 11/10/2014  . RLS (restless legs syndrome) 08/23/2014  . Neuritis or radiculitis due to rupture of lumbar intervertebral disc 06/13/2014  . Degeneration of intervertebral disc of lumbar region 06/13/2014  . Lumbar radiculitis 06/13/2014  . Arthritis 01/23/2014  . Arthritis of knee, degenerative 11/15/2013  . Depression 09/29/2013  . Insomnia 09/29/2013  . Apnea, sleep 08/29/2013  . History of nephrolithiasis 08/29/2013  . Abnormal presence of protein in urine 08/29/2013  . Headache, migraine 08/29/2013  . BP (high blood pressure) 08/29/2013  . HLD (hyperlipidemia) 08/29/2013  . Allergic rhinitis 08/29/2013  . Intractable migraine without aura 02/18/2013   Loralyn Freshwater PT, DPT   04/16/2020, 6:50 PM  [place] [facility] PHYS[facility] AND SPORTS MEDICINE [address], [place], [address] Phone: [phone]   Fax:  [phone]  Name: Jessica Cole MRN: [id number] Date of Birth: 1950/11/20

## 2020-04-16 NOTE — Patient Instructions (Signed)
Access Code: [facility] URL: [email or web address] Date: 04/16/2020 Prepared by: Loralyn Freshwater  Exercises Flexion-Extension Shoulder Pendulum with Table Support - 3 x daily - 7 x weekly - 3 sets - 15 reps Seated Shoulder Flexion Towel Slide at Table Top - 1 x daily - 7 x weekly - 3 sets - 10 reps Supine Shoulder Flexion AAROM - 1 x daily - 7 x weekly - 3 sets - 10 reps Shoulder Scaption AAROM with Dowel - 1 x daily - 7 x weekly - 3 sets - 10 reps Supine Shoulder External Rotation in Scaption AAROM - 1 x daily - 7 x weekly - 3 sets - 10 reps

## 2020-04-19 ENCOUNTER — Other Ambulatory Visit: Payer: Self-pay

## 2020-04-19 ENCOUNTER — Ambulatory Visit: Payer: Medicare HMO

## 2020-04-19 DIAGNOSIS — M6281 Muscle weakness (generalized): Secondary | ICD-10-CM

## 2020-04-19 DIAGNOSIS — M25512 Pain in left shoulder: Secondary | ICD-10-CM

## 2020-04-19 DIAGNOSIS — M25612 Stiffness of left shoulder, not elsewhere classified: Secondary | ICD-10-CM | POA: Diagnosis not present

## 2020-04-19 NOTE — Therapy (Signed)
[facility] [address] [place], [address] Phone: [phone]   Fax:  [phone]  Physical Therapy Treatment  Patient Details  Name: Jessica Cole MRN: [id number] Date of Birth: 01/05/1951 Referring Provider (PT): [place], [place]   Encounter Date: 04/19/2020   PT End of Session - 04/19/20 1604    Visit Number 7    Number of Visits 25    Date for PT Re-Evaluation 06/21/20    Authorization Type 7    Authorization Time Period of 10 progress report    PT Start Time 1603    PT Stop Time 1643    PT Time Calculation (min) 40 min    Activity Tolerance Patient tolerated treatment well    Behavior During Therapy [facility] for tasks assessed/performed           Past Medical History:  Diagnosis Date  . Anxiety   . Asthma    seasonal with allergies  . Colon polyps   . Degenerative arthritis   . Depression   . Diabetes mellitus without complication (HCC)    Type II  . Environmental allergies   . Family history of adverse reaction to anesthesia    sister- PONV  . Fatty liver   . Glaucoma   . Headache(784.0)    HX  MIGRAINES  . History of bronchitis   . History of kidney stones   . History of pneumonia   . Hypertension   . Hypothyroidism   . Obesity   . OSA on CPAP   . Plantar fasciitis    right  . PONV (postoperative nausea and vomiting)   . Renal calculi   . Renal calculi   . RLS (restless legs syndrome) 08/23/2014    Past Surgical History:  Procedure Laterality Date  . ABDOMINAL HYSTERECTOMY     partial  . APPENDECTOMY    . Arthroscopic surgery, knee Left   . BREAST BIOPSY Right    several  . BREAST BIOPSY Right 03/11/2017   u/s bx neg  . BUNIONECTOMY     LEFT   . COLONOSCOPY W/ POLYPECTOMY    . COLONOSCOPY WITH PROPOFOL N/A 08/14/2017   Procedure: COLONOSCOPY WITH PROPOFOL;  Surgeon: Christena Deem, MD;  Location: [facility] ENDOSCOPY;  Service: Endoscopy;  Laterality: N/A;  .  EXTRACORPOREAL SHOCK WAVE LITHOTRIPSY Left 05/14/2017   Procedure: EXTRACORPOREAL SHOCK WAVE LITHOTRIPSY (ESWL);  Surgeon: Riki Altes, MD;  Location: [facility] ORS;  Service: Urology;  Laterality: Left;  . FOOT SURGERY     RIGHT    . KNEE ARTHROSCOPY W/ MENISCAL REPAIR Left   . LASIK    . MAXIMUM ACCESS (MAS)POSTERIOR LUMBAR INTERBODY FUSION (PLIF) 1 LEVEL N/A 04/25/2016   Procedure: LUMBAR FOUR-FIVE  MAXIMUM ACCESS (MAS) POSTERIOR LUMBAR INTERBODY FUSION (PLIF) with extension of instrumentation LUMBAR TWO-FIVE;  Surgeon: Tia Alert, MD;  Location: [facility] OR;  Service: Neurosurgery;  Laterality: N/A;  . MAXIMUM ACCESS (MAS)POSTERIOR LUMBAR INTERBODY FUSION (PLIF) 2 LEVEL N/A 12/13/2015   Procedure: Lumbar two-three - Lumbar three-four MAXIMUM ACCESS (MAS) POSTERIOR LUMBAR INTERBODY FUSION (PLIF)  ;  Surgeon: Tia Alert, MD;  Location: [facility] NEURO ORS;  Service: Neurosurgery;  Laterality: N/A;  . REVERSE SHOULDER ARTHROPLASTY Left 03/01/2020   Procedure: REVERSE SHOULDER ARTHROPLASTY;  Surgeon: Christena Flake, MD;  Location: [facility] ORS;  Service: Orthopedics;  Laterality: Left;  . SHOULDER SURGERY     RIGHT   . TONSILLECTOMY  There were no vitals filed for this visit.   Subjective Assessment - 04/19/20 1604    Subjective L shoulder is bothering her. Bothered her last night and this morning. Not pain pain... just weird uncomfortable feeling. Was not able to do her HEP due 2 doctors appointments yesterday.    Pertinent History S/P L reverse total shoulder replacment. Did not know about only PROM for now. Had home health PT for 2 weeks which involved PROM shoulder abduction, flexion as well as isometric shoulder flexion and abduction.  Pt sleeps on her bed.  Next surgeon appointment is either first or second week of December around Thurday of Friday.  Pt also states having ringing in her ears which started since the surgery.    Patient Stated Goals Raise her L arm better, be able to pick up things  as well as work on crafts.    Currently in Pain? Yes    Pain Score 4    3-4/10 L anterior shoulder and forearm.   Pain Onset More than a month ago                                     PT Education - 04/19/20 1619    Education provided Yes    Education Details ther-ex    [employer]) Educated Patient    Methods Explanation;Demonstration;Tactile cues;Verbal cues    Comprehension Returned demonstration;Verbalized understanding            Objective  No latex band allergies  03/27/2020: Blood pressure R arm sitting, mechanically taken, normal cuff: 170/80, HR 59  Last home health session was about over a week ago  L shoulder incisions: healing, scab formation with slight redness around the scabs.  6 weeks post op   MedbridgeAccess Code [email or web address]    Manual therapy  Seated STM L upper trap muscle to decrease tension   Therapeutic exercise  Reclined AAROM L shoulder with PT assist             Flexion 10x3             scaption 10x3              L shoulder abduction AAROM with PT reclined 10x3              ER in scapular plate [id number]    Seated with feet propped   B scapular retraction 10x3 with 5 second holds    L shoulder flexion AAROM with R UE assist, L elbow bent 10x  Improved exercise technique, movement at target joints, use of target muscles after mod verbal, visual, tactile cues.    Response to treatment Pt tolerated session well without aggravagtion of symptoms.    Clinical impression Stiff non-painful end-feel L shoulder joint with AAROM. Decreased joint stiffness palpated with AAROM exercises. Pt tolerated session well without aggravation of symptoms. Pt will benefit from continued skilled physical therapy services to decrease stiffness, improve ROM, strength and function.             PT Short Term Goals - 03/27/20 1223      PT SHORT TERM GOAL #1   Title Pt will be  independent with her initial HEP to improve ROM and ability to raise her arm with less difficulty when appropriate.    Time 3    Period Weeks    Status New    Target Date 04/19/20  PT Long Term Goals - 03/27/20 1225      PT LONG TERM GOAL #1   Title Patient will improve L shoulder PROM flexion, and abduction AROM to at least 155 degrees, and ER to 80 degrees to promote ability to reach with less difficulty when appropriate.    Baseline PROM flexion 70 degrees, abduction 58 degrees, ER 5 degrees (scapular plane) 03/27/2020.    Time 12    Period Weeks    Status New    Target Date 06/21/20      PT LONG TERM GOAL #2   Title Pt will have at least 145 degrees L shoulder flexion and abduction AROM, and 75 degrees ER AROM to promote ability to reach with less difficulty when appropriate.    Baseline AROM not yet performed (03/27/2020)    Time 12    Period Weeks    Status New    Target Date 06/21/20      PT LONG TERM GOAL #3   Title PT will improve her FOTO score by at least 20 points as a demonstration of improved function.    Baseline Shoulder FOTO: 34 (03/27/2020)    Time 12    Period Weeks    Status New    Target Date 06/21/20                 Plan - 04/19/20 1619    Clinical Impression Statement Stiff non-painful end-feel L shoulder joint with AAROM. Decreased joint stiffness palpated with AAROM exercises. Pt tolerated session well without aggravation of symptoms. Pt will benefit from continued skilled physical therapy services to decrease stiffness, improve ROM, strength and function.    Personal Factors and Comorbidities Age;Comorbidity 3+;Fitness;Time since onset of injury/illness/exacerbation    Comorbidities anxiety, depression, DM, HTN    Examination-Activity Limitations Bathing;Hygiene/Grooming;Lift;Reach Overhead;Toileting;Carry    Stability/Clinical Decision Making Stable/Uncomplicated    Clinical Decision Making Low    Rehab Potential Fair     Clinical Impairments Affecting Rehab Potential Possible difficulty adhering to precautions. Pt needs constant reminder and cueing for PROM during the first few weeks of rehab.    PT Frequency 2x / week    PT Duration 12 weeks    PT Treatment/Interventions Therapeutic activities;Therapeutic exercise;Neuromuscular re-education;Patient/family education;Manual techniques;Passive range of motion;Dry needling;Vestibular;Canalith Repostioning;Electrical Stimulation;Iontophoresis 4mg /ml Dexamethasone    PT Next Visit Plan PROM, manual techniques, modalities PRN. [facility] Reverse Total Shoulder Replacement Protocol unless otherwise noted by MD.    Consulted and Agree with Plan of Care Patient           Patient will benefit from skilled therapeutic intervention in order to improve the following deficits and impairments:  Pain,Postural dysfunction,Improper body mechanics,Decreased strength,Decreased range of motion  Visit Diagnosis: Stiffness of left shoulder, not elsewhere classified  Chronic left shoulder pain  Muscle weakness (generalized)     Problem List Patient Active Problem List   Diagnosis Date Noted  . 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) 01/03/2019  . Chronic cough 11/19/2016  . Morbid obesity (HCC) 09/26/2016  . Left ovarian cyst 05/30/2016  . S/P lumbar spinal fusion 12/13/2015  . Aortic calcification (HCC) 07/18/2015  . Controlled type 2 diabetes mellitus without complication (HCC) 05/16/2015  . Thrombocytopenia (HCC) 05/16/2015  . Recurrent major depressive disorder, in full remission (HCC) 05/16/2015  . Essential (primary) hypertension 05/16/2015  . Fatty infiltration of liver 03/15/2015  . Aortic valve stenosis, nonrheumatic 01/18/2015  .  Sciatica of right side 01/09/2015  . Type 2 diabetes mellitus (HCC) 11/10/2014  . RLS (restless legs syndrome)  08/23/2014  . Neuritis or radiculitis due to rupture of lumbar intervertebral disc 06/13/2014  . Degeneration of intervertebral disc of lumbar region 06/13/2014  . Lumbar radiculitis 06/13/2014  . Arthritis 01/23/2014  . Arthritis of knee, degenerative 11/15/2013  . Depression 09/29/2013  . Insomnia 09/29/2013  . Apnea, sleep 08/29/2013  . History of nephrolithiasis 08/29/2013  . Abnormal presence of protein in urine 08/29/2013  . Headache, migraine 08/29/2013  . BP (high blood pressure) 08/29/2013  . HLD (hyperlipidemia) 08/29/2013  . Allergic rhinitis 08/29/2013  . Intractable migraine without aura 02/18/2013    Loralyn FreshwaterMiguel Jaana Brodt PT, DPT   04/19/2020, 5:56 PM  [facility] [address] [place], [place], [address] Phone: [phone]   Fax:  [phone]  Name: Jessica Cole MRN: [id number] Date of Birth: [birth year]

## 2020-04-23 ENCOUNTER — Other Ambulatory Visit: Payer: Self-pay

## 2020-04-23 ENCOUNTER — Ambulatory Visit: Payer: Medicare HMO

## 2020-04-23 DIAGNOSIS — M25612 Stiffness of left shoulder, not elsewhere classified: Secondary | ICD-10-CM | POA: Diagnosis not present

## 2020-04-23 DIAGNOSIS — M25512 Pain in left shoulder: Secondary | ICD-10-CM

## 2020-04-23 DIAGNOSIS — M6281 Muscle weakness (generalized): Secondary | ICD-10-CM

## 2020-04-23 NOTE — Therapy (Signed)
[facility] [address] [place], [place], [address] Phone: [phone]   Fax:  [phone]  Physical Therapy Treatment  Patient Details  Name: Jessica Cole MRN: [id number] Date of Birth: 25-Sep-1950 Referring Provider (PT): Anson [place], [place]   Encounter Date: 04/23/2020   PT End of Session - 04/23/20 1048    Visit Number 8    Number of Visits 25    Date for PT Re-Evaluation 06/21/20    Authorization Type 8    Authorization Time Period of 10 progress report    PT Start Time 1048    PT Stop Time 1115    PT Time Calculation (min) 27 min    Activity Tolerance Patient tolerated treatment well    Behavior During Therapy WFL for tasks assessed/performed           Past Medical History:  Diagnosis Date   Anxiety    Asthma    seasonal with allergies   Colon polyps    Degenerative arthritis    Depression    Diabetes mellitus without complication (HCC)    Type II   Environmental allergies    Family history of adverse reaction to anesthesia    sister- PONV   Fatty liver    Glaucoma    Headache(784.0)    HX  MIGRAINES   History of bronchitis    History of kidney stones    History of pneumonia    Hypertension    Hypothyroidism    Obesity    OSA on CPAP    Plantar fasciitis    right   PONV (postoperative nausea and vomiting)    Renal calculi    Renal calculi    RLS (restless legs syndrome) 08/23/2014    Past Surgical History:  Procedure Laterality Date   ABDOMINAL HYSTERECTOMY     partial   APPENDECTOMY     Arthroscopic surgery, knee Left    BREAST BIOPSY Right    several   BREAST BIOPSY Right 03/11/2017   u/s bx neg   BUNIONECTOMY     LEFT    COLONOSCOPY W/ POLYPECTOMY     COLONOSCOPY WITH PROPOFOL N/A 08/14/2017   Procedure: COLONOSCOPY WITH PROPOFOL;  Surgeon: Christena Deem, MD;  Location: [facility] ENDOSCOPY;  Service: Endoscopy;  Laterality: N/A;    EXTRACORPOREAL SHOCK WAVE LITHOTRIPSY Left 05/14/2017   Procedure: EXTRACORPOREAL SHOCK WAVE LITHOTRIPSY (ESWL);  Surgeon: Riki Altes, MD;  Location: [facility] ORS;  Service: Urology;  Laterality: Left;   FOOT SURGERY     RIGHT     KNEE ARTHROSCOPY W/ MENISCAL REPAIR Left    LASIK     MAXIMUM ACCESS (MAS)POSTERIOR LUMBAR INTERBODY FUSION (PLIF) 1 LEVEL N/A 04/25/2016   Procedure: LUMBAR FOUR-FIVE  MAXIMUM ACCESS (MAS) POSTERIOR LUMBAR INTERBODY FUSION (PLIF) with extension of instrumentation LUMBAR TWO-FIVE;  Surgeon: Tia Alert, MD;  Location: [facility] OR;  Service: Neurosurgery;  Laterality: N/A;   MAXIMUM ACCESS (MAS)POSTERIOR LUMBAR INTERBODY FUSION (PLIF) 2 LEVEL N/A 12/13/2015   Procedure: Lumbar two-three - Lumbar three-four MAXIMUM ACCESS (MAS) POSTERIOR LUMBAR INTERBODY FUSION (PLIF)  ;  Surgeon: Tia Alert, MD;  Location: [facility] NEURO ORS;  Service: Neurosurgery;  Laterality: N/A;   REVERSE SHOULDER ARTHROPLASTY Left 03/01/2020   Procedure: REVERSE SHOULDER ARTHROPLASTY;  Surgeon: Christena Flake, MD;  Location: [facility] ORS;  Service: Orthopedics;  Laterality: Left;   SHOULDER SURGERY     RIGHT    TONSILLECTOMY  There were no vitals filed for this visit.   Subjective Assessment - 04/23/20 1050    Subjective L shoulder is very good.    Pertinent History S/P L reverse total shoulder replacment. Did not know about only PROM for now. Had home health PT for 2 weeks which involved PROM shoulder abduction, flexion as well as isometric shoulder flexion and abduction.  Pt sleeps on her bed.  Next surgeon appointment is either first or second week of December around Thurday of Friday.  Pt also states having ringing in her ears which started since the surgery.    Patient Stated Goals Raise her L arm better, be able to pick up things as well as work on crafts.    Currently in Pain? No/denies    Pain Onset More than a month ago                                      PT Education - 04/23/20 1112    Education provided Yes    Education Details ther-ex    [employer]) Educated Patient    Methods Explanation;Demonstration;Tactile cues;Verbal cues    Comprehension Returned demonstration;Verbalized understanding          Objective  No latex band allergies  03/27/2020: Blood pressure R arm sitting, mechanically taken, normal cuff: 170/80, HR 59  Last home health session was about over a week ago  L shoulder incisions: healing, scab formation with slight redness around the scabs.  6-7weeks post op   MedbridgeAccess Code [email or web address]    Therapeutic exercise  Reclined AAROM L shoulder with PT assist Flexion 10x3  scaption 10x3  L shoulder abduction AAROM with PT reclined 10x3  ER in scapular plate [id number]    Seated B scapular retraction 10x3 with 5 second holds     Improved exercise technique, movement at target joints, use of target muscles after mod verbal, visual, tactile cues.    Response to treatment Pt tolerated session well without aggravagtion of symptoms.   Clinical impression Pt continues to tolerated shoulder AAROM very well. Stiff, non-painful end feel. Continued working on ROM to decrease stiffness and improve ability to raise her arm with less difficulty. Pt toelrated session well without aggravation of symptoms. Decreased stiffness palpated with shoulder ROM exercises. Pt will benefit from continued skilled physical therapy services to improve ROM, strength and function.         PT Short Term Goals - 03/27/20 1223      PT SHORT TERM GOAL #1   Title Pt will be independent with her initial HEP to improve ROM and ability to raise her arm with less difficulty when appropriate.    Time 3    Period Weeks    Status New    Target Date 04/19/20             PT Long Term Goals - 03/27/20 1225      PT LONG TERM GOAL #1   Title  Patient will improve L shoulder PROM flexion, and abduction AROM to at least 155 degrees, and ER to 80 degrees to promote ability to reach with less difficulty when appropriate.    Baseline PROM flexion 70 degrees, abduction 58 degrees, ER 5 degrees (scapular plane) 03/27/2020.    Time 12    Period Weeks    Status New    Target Date 06/21/20      PT LONG TERM GOAL #  2   Title Pt will have at least 145 degrees L shoulder flexion and abduction AROM, and 75 degrees ER AROM to promote ability to reach with less difficulty when appropriate.    Baseline AROM not yet performed (03/27/2020)    Time 12    Period Weeks    Status New    Target Date 06/21/20      PT LONG TERM GOAL #3   Title PT will improve her FOTO score by at least 20 points as a demonstration of improved function.    Baseline Shoulder FOTO: 34 (03/27/2020)    Time 12    Period Weeks    Status New    Target Date 06/21/20                 Plan - 04/23/20 1112    Clinical Impression Statement Pt continues to tolerated shoulder AAROM very well. Stiff, non-painful end feel. Continued working on ROM to decrease stiffness and improve ability to raise her arm with less difficulty. Pt toelrated session well without aggravation of symptoms. Decreased stiffness palpated with shoulder ROM exercises. Pt will benefit from continued skilled physical therapy services to improve ROM, strength and function.    Personal Factors and Comorbidities Age;Comorbidity 3+;Fitness;Time since onset of injury/illness/exacerbation    Comorbidities anxiety, depression, DM, HTN    Examination-Activity Limitations Bathing;Hygiene/Grooming;Lift;Reach Overhead;Toileting;Carry    Stability/Clinical Decision Making Stable/Uncomplicated    Rehab Potential Fair    Clinical Impairments Affecting Rehab Potential Possible difficulty adhering to precautions. Pt needs constant reminder and cueing for PROM during the first few weeks of rehab.    PT Frequency 2x /  week    PT Duration 12 weeks    PT Treatment/Interventions Therapeutic activities;Therapeutic exercise;Neuromuscular re-education;Patient/family education;Manual techniques;Passive range of motion;Dry needling;Vestibular;Canalith Repostioning;Electrical Stimulation;Iontophoresis 4mg /ml Dexamethasone    PT Next Visit Plan PROM, manual techniques, modalities PRN. [facility] Reverse Total Shoulder Replacement Protocol unless otherwise noted by MD.    Consulted and Agree with Plan of Care Patient           Patient will benefit from skilled therapeutic intervention in order to improve the following deficits and impairments:  Pain,Postural dysfunction,Improper body mechanics,Decreased strength,Decreased range of motion  Visit Diagnosis: Stiffness of left shoulder, not elsewhere classified  Chronic left shoulder pain  Muscle weakness (generalized)     Problem List Patient Active Problem List   Diagnosis Date Noted   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) 01/03/2019   Chronic cough 11/19/2016   Morbid obesity (HCC) 09/26/2016   Left ovarian cyst 05/30/2016   S/P lumbar spinal fusion 12/13/2015   Aortic calcification (HCC) 07/18/2015   Controlled type 2 diabetes mellitus without complication (HCC) 05/16/2015   Thrombocytopenia (HCC) 05/16/2015   Recurrent major depressive disorder, in full remission (HCC) 05/16/2015   Essential (primary) hypertension 05/16/2015   Fatty infiltration of liver 03/15/2015   Aortic valve stenosis, nonrheumatic 01/18/2015   Sciatica of right side 01/09/2015   Type 2 diabetes mellitus (HCC) 11/10/2014   RLS (restless legs syndrome) 08/23/2014   Neuritis or radiculitis due to rupture of lumbar intervertebral disc 06/13/2014   Degeneration of intervertebral disc of lumbar region 06/13/2014   Lumbar radiculitis 06/13/2014    Arthritis 01/23/2014   Arthritis of knee, degenerative 11/15/2013   Depression 09/29/2013   Insomnia 09/29/2013   Apnea, sleep 08/29/2013   History of nephrolithiasis 08/29/2013   Abnormal presence of protein  in urine 08/29/2013   Headache, migraine 08/29/2013   BP (high blood pressure) 08/29/2013   HLD (hyperlipidemia) 08/29/2013   Allergic rhinitis 08/29/2013   Intractable migraine without aura 02/18/2013    Loralyn Freshwater PT, DPT   04/23/2020, 12:26 PM  [facility] [address] [place], [address] Phone: [phone]   Fax:  [phone]  Name: Jessica Cole MRN: [id number] Date of Birth: 01/28/1951

## 2020-05-02 ENCOUNTER — Ambulatory Visit: Payer: Medicare HMO

## 2020-05-02 ENCOUNTER — Other Ambulatory Visit: Payer: Self-pay

## 2020-05-02 DIAGNOSIS — M6281 Muscle weakness (generalized): Secondary | ICD-10-CM

## 2020-05-02 DIAGNOSIS — M25612 Stiffness of left shoulder, not elsewhere classified: Secondary | ICD-10-CM

## 2020-05-02 DIAGNOSIS — G8929 Other chronic pain: Secondary | ICD-10-CM

## 2020-05-02 NOTE — Therapy (Signed)
[facility] [address] [place], [address] Phone: [phone]   Fax:  [phone]  Physical Therapy Treatment  Patient Details  Name: Jessica Cole MRN: [id number] Date of Birth: Sep 03, 1950 Referring Provider (PT): [place], [place]   Encounter Date: 05/02/2020   PT End of Session - 05/02/20 1030    Visit Number 9    Number of Visits 25    Date for PT Re-Evaluation 06/21/20    Authorization Type 9    Authorization Time Period of 10 progress report    PT Start Time 1031    PT Stop Time 1116    PT Time Calculation (min) 45 min    Activity Tolerance Patient tolerated treatment well    Behavior During Therapy [facility] for tasks assessed/performed           Past Medical History:  Diagnosis Date  . Anxiety   . Asthma    seasonal with allergies  . Colon polyps   . Degenerative arthritis   . Depression   . Diabetes mellitus without complication (HCC)    Type II  . Environmental allergies   . Family history of adverse reaction to anesthesia    sister- PONV  . Fatty liver   . Glaucoma   . Headache(784.0)    HX  MIGRAINES  . History of bronchitis   . History of kidney stones   . History of pneumonia   . Hypertension   . Hypothyroidism   . Obesity   . OSA on CPAP   . Plantar fasciitis    right  . PONV (postoperative nausea and vomiting)   . Renal calculi   . Renal calculi   . RLS (restless legs syndrome) 08/23/2014    Past Surgical History:  Procedure Laterality Date  . ABDOMINAL HYSTERECTOMY     partial  . APPENDECTOMY    . Arthroscopic surgery, knee Left   . BREAST BIOPSY Right    several  . BREAST BIOPSY Right 03/11/2017   u/s bx neg  . BUNIONECTOMY     LEFT   . COLONOSCOPY W/ POLYPECTOMY    . COLONOSCOPY WITH PROPOFOL N/A 08/14/2017   Procedure: COLONOSCOPY WITH PROPOFOL;  Surgeon: Christena Deem, MD;  Location: [facility] ENDOSCOPY;  Service: Endoscopy;  Laterality: N/A;  .  EXTRACORPOREAL SHOCK WAVE LITHOTRIPSY Left 05/14/2017   Procedure: EXTRACORPOREAL SHOCK WAVE LITHOTRIPSY (ESWL);  Surgeon: Riki Altes, MD;  Location: [facility] ORS;  Service: Urology;  Laterality: Left;  . FOOT SURGERY     RIGHT    . KNEE ARTHROSCOPY W/ MENISCAL REPAIR Left   . LASIK    . MAXIMUM ACCESS (MAS)POSTERIOR LUMBAR INTERBODY FUSION (PLIF) 1 LEVEL N/A 04/25/2016   Procedure: LUMBAR FOUR-FIVE  MAXIMUM ACCESS (MAS) POSTERIOR LUMBAR INTERBODY FUSION (PLIF) with extension of instrumentation LUMBAR TWO-FIVE;  Surgeon: Tia Alert, MD;  Location: [facility] OR;  Service: Neurosurgery;  Laterality: N/A;  . MAXIMUM ACCESS (MAS)POSTERIOR LUMBAR INTERBODY FUSION (PLIF) 2 LEVEL N/A 12/13/2015   Procedure: Lumbar two-three - Lumbar three-four MAXIMUM ACCESS (MAS) POSTERIOR LUMBAR INTERBODY FUSION (PLIF)  ;  Surgeon: Tia Alert, MD;  Location: [facility] NEURO ORS;  Service: Neurosurgery;  Laterality: N/A;  . REVERSE SHOULDER ARTHROPLASTY Left 03/01/2020   Procedure: REVERSE SHOULDER ARTHROPLASTY;  Surgeon: Christena Flake, MD;  Location: [facility] ORS;  Service: Orthopedics;  Laterality: Left;  . SHOULDER SURGERY     RIGHT   . TONSILLECTOMY  There were no vitals filed for this visit.   Subjective Assessment - 05/02/20 1032    Subjective L shoulder is not as good as it has been. Starting to lock up more. Hurts more when it locks up compared to before. No pain currently. Took pain medication all through the night. Has restless leg syndrome. Has not been doing her home exercises.    Pertinent History S/P L reverse total shoulder replacment. Did not know about only PROM for now. Had home health PT for 2 weeks which involved PROM shoulder abduction, flexion as well as isometric shoulder flexion and abduction.  Pt sleeps on her bed.  Next surgeon appointment is either first or second week of December around Thurday of Friday.  Pt also states having ringing in her ears which started since the surgery.    Patient Stated  Goals Raise her L arm better, be able to pick up things as well as work on crafts.    Currently in Pain? No/denies    Pain Score 0-No pain    Pain Onset More than a month ago              [facility] PT Assessment - 05/02/20 1052      AROM   Overall AROM Comments Reclined AAROM    Left Shoulder Flexion 122 Degrees   with radial nerve symptoms.   Left Shoulder ABduction 90 Degrees    Left Shoulder External Rotation 7 Degrees   Scapular plane                                PT Education - 05/02/20 1106    Education provided Yes    Education Details ther-ex, HEP    Person(s) Educated Patient    Methods Explanation;Demonstration;Tactile cues;Verbal cues    Comprehension Returned demonstration;Verbalized understanding          Objective  No latex band allergies  03/27/2020: Blood pressure R arm sitting, mechanically taken, normal cuff: 170/80, HR 59  Last home health session was about over a week ago  L shoulder incisions: healing, scab formation with slight redness around the scabs.  9weeks post op   MedbridgeAccess Code [email or web address]    Therapeutic exercise  Reclined AAROM L shoulderwith PT assist Flexion 10x3  scaption 10x3  L shoulder abduction AAROM with PT reclined 10x3  ER in scapular plate 86P6   Stiff and uncomfortable end feel.   Emphasized importance of L shoulder HEP to make it a priority secondary to pt reports of not performing her HEP. Pt verbalized understanding.   Reviewed progress/current status with L shoulder ROM with pt.    SeatedB scapular retraction 10x3 with 5 second holds     Improved exercise technique, movement at target joints, use of target muscles after mod verbal, visual, tactile cues.    Manual therapy  Seated STM R thoracolumbar paraspinal muscles to decrease tension improve comfort with L shoulder rehab  Decreased  lumbothoracic discomfort afterwards   Response to treatment Improved L L shoulder AAROM with treatment.    Clinical impression Continued working on L shoulder AAROM exercises. Improved L shoulder ROM after treatment. Stiff and uncomfortable end feel. Worked on manual techniques to decrease lumbothoracic paraspinal muscle tension to improve comfort with L shoulder rehab secondary to back pain. Pt tolerated session well without aggravation of symptoms. Pt will benefit from continued skilled physical therapy services to improve ROM, strength, and function.  PT Short Term Goals - 03/27/20 1223      PT SHORT TERM GOAL #1   Title Pt will be independent with her initial HEP to improve ROM and ability to raise her arm with less difficulty when appropriate.    Time 3    Period Weeks    Status New    Target Date 04/19/20             PT Long Term Goals - 03/27/20 1225      PT LONG TERM GOAL #1   Title Patient will improve L shoulder PROM flexion, and abduction AROM to at least 155 degrees, and ER to 80 degrees to promote ability to reach with less difficulty when appropriate.    Baseline PROM flexion 70 degrees, abduction 58 degrees, ER 5 degrees (scapular plane) 03/27/2020.    Time 12    Period Weeks    Status New    Target Date 06/21/20      PT LONG TERM GOAL #2   Title Pt will have at least 145 degrees L shoulder flexion and abduction AROM, and 75 degrees ER AROM to promote ability to reach with less difficulty when appropriate.    Baseline AROM not yet performed (03/27/2020)    Time 12    Period Weeks    Status New    Target Date 06/21/20      PT LONG TERM GOAL #3   Title PT will improve her FOTO score by at least 20 points as a demonstration of improved function.    Baseline Shoulder FOTO: 34 (03/27/2020)    Time 12    Period Weeks    Status New    Target Date 06/21/20                 Plan - 05/02/20 1029    Clinical Impression Statement Continued  working on L shoulder AAROM exercises. Improved L shoulder ROM after treatment. Stiff and uncomfortable end feel. Worked on manual techniques to decrease lumbothoracic paraspinal muscle tension to improve comfort with L shoulder rehab secondary to back pain. Pt tolerated session well without aggravation of symptoms. Pt will benefit from continued skilled physical therapy services to improve ROM, strength, and function.    Personal Factors and Comorbidities Age;Comorbidity 3+;Fitness;Time since onset of injury/illness/exacerbation    Comorbidities anxiety, depression, DM, HTN    Examination-Activity Limitations Bathing;Hygiene/Grooming;Lift;Reach Overhead;Toileting;Carry    Stability/Clinical Decision Making Stable/Uncomplicated    Clinical Decision Making Low    Rehab Potential Fair    Clinical Impairments Affecting Rehab Potential Possible difficulty adhering to precautions. Pt needs constant reminder and cueing for PROM during the first few weeks of rehab.    PT Frequency 2x / week    PT Duration 12 weeks    PT Treatment/Interventions Therapeutic activities;Therapeutic exercise;Neuromuscular re-education;Patient/family education;Manual techniques;Passive range of motion;Dry needling;Vestibular;Canalith Repostioning;Electrical Stimulation;Iontophoresis 4mg /ml Dexamethasone    PT Next Visit Plan PROM, manual techniques, modalities PRN. [facility] Reverse Total Shoulder Replacement Protocol unless otherwise noted by MD.    Consulted and Agree with Plan of Care Patient           Patient will benefit from skilled therapeutic intervention in order to improve the following deficits and impairments:  Pain,Postural dysfunction,Improper body mechanics,Decreased strength,Decreased range of motion  Visit Diagnosis: Stiffness of left shoulder, not elsewhere classified  Chronic left shoulder pain  Muscle weakness (generalized)     Problem List Patient Active Problem  List   Diagnosis Date Noted  .  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) 01/03/2019  . Chronic cough 11/19/2016  . Morbid obesity (HCC) 09/26/2016  . Left ovarian cyst 05/30/2016  . S/P lumbar spinal fusion 12/13/2015  . Aortic calcification (HCC) 07/18/2015  . Controlled type 2 diabetes mellitus without complication (HCC) 05/16/2015  . Thrombocytopenia (HCC) 05/16/2015  . Recurrent major depressive disorder, in full remission (HCC) 05/16/2015  . Essential (primary) hypertension 05/16/2015  . Fatty infiltration of liver 03/15/2015  . Aortic valve stenosis, nonrheumatic 01/18/2015  . Sciatica of right side 01/09/2015  . Type 2 diabetes mellitus (HCC) 11/10/2014  . RLS (restless legs syndrome) 08/23/2014  . Neuritis or radiculitis due to rupture of lumbar intervertebral disc 06/13/2014  . Degeneration of intervertebral disc of lumbar region 06/13/2014  . Lumbar radiculitis 06/13/2014  . Arthritis 01/23/2014  . Arthritis of knee, degenerative 11/15/2013  . Depression 09/29/2013  . Insomnia 09/29/2013  . Apnea, sleep 08/29/2013  . History of nephrolithiasis 08/29/2013  . Abnormal presence of protein in urine 08/29/2013  . Headache, migraine 08/29/2013  . BP (high blood pressure) 08/29/2013  . HLD (hyperlipidemia) 08/29/2013  . Allergic rhinitis 08/29/2013  . Intractable migraine without aura 02/18/2013    Loralyn Freshwater PT, DPT  05/02/2020, 12:36 PM  [facility] SPORTS MEDICINE [address] [place], [address] Phone: [phone]   Fax:  [phone]  Name: MARKIESHA DELIA MRN: [id number] Date of Birth: [birth year]

## 2020-05-03 ENCOUNTER — Ambulatory Visit: Payer: Medicare HMO

## 2020-05-07 ENCOUNTER — Other Ambulatory Visit: Payer: Self-pay

## 2020-05-07 ENCOUNTER — Ambulatory Visit: Payer: Medicare HMO | Attending: Family Medicine

## 2020-05-07 DIAGNOSIS — G8929 Other chronic pain: Secondary | ICD-10-CM | POA: Diagnosis present

## 2020-05-07 DIAGNOSIS — M25612 Stiffness of left shoulder, not elsewhere classified: Secondary | ICD-10-CM | POA: Insufficient documentation

## 2020-05-07 DIAGNOSIS — M6281 Muscle weakness (generalized): Secondary | ICD-10-CM | POA: Diagnosis present

## 2020-05-07 DIAGNOSIS — M25512 Pain in left shoulder: Secondary | ICD-10-CM | POA: Diagnosis present

## 2020-05-07 NOTE — Therapy (Signed)
[place] [facility] [address] [place], [address] Phone: [phone]   Fax:  [phone]  Physical Therapy Treatment  Patient Details  Name: Jessica Cole MRN: [id number] Date of Birth: [birth year] Referring Provider (PT): Lattie Corns, [place]   Encounter Date: 05/07/2020   PT End of Session - 05/07/20 0917    Visit Number 10    Number of Visits 25    Date for PT Re-Evaluation 06/21/20    Authorization Type 10    Authorization Time Period of 10 progress report    PT Start Time 0917    PT Stop Time 1000    PT Time Calculation (min) 43 min    Activity Tolerance Patient tolerated treatment well    Behavior During Therapy [facility] for tasks assessed/performed           Past Medical History:  Diagnosis Date  . Anxiety   . Asthma    seasonal with allergies  . Colon polyps   . Degenerative arthritis   . Depression   . Diabetes mellitus without complication ([place])    Type II  . Environmental allergies   . Family history of adverse reaction to anesthesia    sister- PONV  . Fatty liver   . Glaucoma   . Headache(784.0)    HX  MIGRAINES  . History of bronchitis   . History of kidney stones   . History of pneumonia   . Hypertension   . Hypothyroidism   . Obesity   . OSA on CPAP   . Plantar fasciitis    right  . PONV (postoperative nausea and vomiting)   . Renal calculi   . Renal calculi   . RLS (restless legs syndrome) 08/23/2014    Past Surgical History:  Procedure Laterality Date  . ABDOMINAL HYSTERECTOMY     partial  . APPENDECTOMY    . Arthroscopic surgery, knee Left   . BREAST BIOPSY Right    several  . BREAST BIOPSY Right 03/11/2017   u/s bx neg  . BUNIONECTOMY     LEFT   . COLONOSCOPY W/ POLYPECTOMY    . COLONOSCOPY WITH PROPOFOL N/A 08/14/2017   Procedure: COLONOSCOPY WITH PROPOFOL;  Surgeon: Lollie Sails, MD;  Location: [facility] ENDOSCOPY;  Service: Endoscopy;  Laterality: N/A;  .  EXTRACORPOREAL SHOCK WAVE LITHOTRIPSY Left 05/14/2017   Procedure: EXTRACORPOREAL SHOCK WAVE LITHOTRIPSY (ESWL);  Surgeon: Abbie Sons, MD;  Location: [facility] ORS;  Service: Urology;  Laterality: Left;  . FOOT SURGERY     RIGHT    . KNEE ARTHROSCOPY W/ MENISCAL REPAIR Left   . LASIK    . MAXIMUM ACCESS (MAS)POSTERIOR LUMBAR INTERBODY FUSION (PLIF) 1 LEVEL N/A 04/25/2016   Procedure: LUMBAR FOUR-FIVE  MAXIMUM ACCESS (MAS) POSTERIOR LUMBAR INTERBODY FUSION (PLIF) with extension of instrumentation LUMBAR TWO-FIVE;  Surgeon: Eustace Moore, MD;  Location: [place];  Service: Neurosurgery;  Laterality: N/A;  . MAXIMUM ACCESS (MAS)POSTERIOR LUMBAR INTERBODY FUSION (PLIF) 2 LEVEL N/A 12/13/2015   Procedure: Lumbar two-three - Lumbar three-four MAXIMUM ACCESS (MAS) POSTERIOR LUMBAR INTERBODY FUSION (PLIF)  ;  Surgeon: Eustace Moore, MD;  Location: [facility] NEURO ORS;  Service: Neurosurgery;  Laterality: N/A;  . REVERSE SHOULDER ARTHROPLASTY Left 03/01/2020   Procedure: REVERSE SHOULDER ARTHROPLASTY;  Surgeon: Corky Mull, MD;  Location: [facility] ORS;  Service: Orthopedics;  Laterality: Left;  . SHOULDER SURGERY     RIGHT   . TONSILLECTOMY  There were no vitals filed for this visit.   Subjective Assessment - 05/07/20 0918    Subjective L shoulder still wants to kind of lock up a little bit. 1/10 currently. Feels like she might also have kidney stones again.    Pertinent History S/P L reverse total shoulder replacment. Did not know about only PROM for now. Had home health PT for 2 weeks which involved PROM shoulder abduction, flexion as well as isometric shoulder flexion and abduction.  Pt sleeps on her bed.  Next surgeon appointment is either first or second week of December around Thurday of Friday.  Pt also states having ringing in her ears which started since the surgery.    Patient Stated Goals Raise her L arm better, be able to pick up things as well as work on crafts.    Currently in Pain? Yes    Pain  Score 1     Pain Onset More than a month ago              [facility] PT Assessment - 05/07/20 0932      AROM   Left Shoulder Flexion 125 Degrees    Left Shoulder ABduction 95 Degrees    Left Shoulder External Rotation 22 Degrees                               Objective  No latex band allergies  03/27/2020: Blood pressure R arm sitting, mechanically taken, normal cuff: 170/80, HR 59  Last home health session was about over a week ago  L shoulder incisions: healing, scab formation with slight redness around the scabs.  9-10weeks post op   MedbridgeAccess Code [email or web address]    Therapeutic exercise  Reclined AAROM L shoulderwith PT assist Flexion 10x3  scaption 10x3  L shoulder abduction AAROM with PT reclined 10x3  ER in scapular plane 10x3             Stiff end feel.    SeatedB scapular retraction 10x3 with 5 second holds    Seated L shoulder AAROM with SPC   Flexion 10x2  scaption 10x2   Improved exercise technique, movement at target joints, use of target muscles after mod verbal, visual, tactile cues.    Manual therapy Seated STM R upper trap muscle to decrease tension secondary to L side atrophy form surgery.     thoracolumbar paraspinal muscles to decrease tension improve comfort with L shoulder rehab             Decreased lumbothoracic discomfort afterwards   Response to treatment Improved L L shoulder AAROM with treatment.    Clinical impression Stiff end feel but more comfortable today compared to previous session. Improved ROM with repetition. Pt demonstrates improved L shoulder flexion, abduction, and ER PROM/AAROM as well as function since initial evaluation. Pt will benefit from continued skilled physical therapy services to improve ROM, strength and function.        PT Education - 05/07/20 1019    Education provided Yes    Education  Details ther-ex    [employer]) Educated Patient    Methods Explanation;Demonstration;Tactile cues;Verbal cues    Comprehension Returned demonstration;Verbalized understanding            PT Short Term Goals - 05/07/20 1009      PT SHORT TERM GOAL #1   Title Pt will be independent with her initial HEP to improve ROM and ability to raise her  arm with less difficulty when appropriate.    Baseline Pt has not been able to perform much of her HEP (05/07/2020)    Time 3    Period Weeks    Status On-going    Target Date 05/31/20             PT Long Term Goals - 05/07/20 1010      PT LONG TERM GOAL #1   Title Patient will improve L shoulder PROM flexion, and abduction AROM to at least 155 degrees, and ER to 80 degrees to promote ability to reach with less difficulty when appropriate.    Baseline PROM flexion 70 degrees, abduction 58 degrees, ER 5 degrees (scapular plane) 03/27/2020.; AAROM reclined: 125 degrees flexion, 95 degrees abduction, 22 degrees ER in scapular plane (05/07/20)    Time 12    Period Weeks    Status Partially Met    Target Date 06/21/20      PT LONG TERM GOAL #2   Title Pt will have at least 145 degrees L shoulder flexion and abduction AROM, and 75 degrees ER AROM to promote ability to reach with less difficulty when appropriate.    Baseline AROM not yet performed (03/27/2020);AAROM reclined: 125 degrees flexion, 95 degrees abduction, 22 degrees ER in scapular plane (05/07/20)    Time 12    Period Weeks    Status Partially Met    Target Date 06/21/20      PT LONG TERM GOAL #3   Title PT will improve her FOTO score by at least 20 points as a demonstration of improved function.    Baseline Shoulder FOTO: 34 (03/27/2020); 50 (05/07/2020)    Time 12    Period Weeks    Status Partially Met    Target Date 06/21/20      PT LONG TERM GOAL #4   Title Pt will have at least 4+/5 MMT grade L shoulder flexion, abduction, ER and IR to promote ability to perform functional tasks.     Baseline Manual resistance not yet performed. (05/07/2020)    Time 6    Period Weeks    Status New    Target Date 06/21/20                 Plan - 05/07/20 1008    Clinical Impression Statement Stiff end feel but more comfortable today compared to previous session. Improved ROM with repetition. Pt demonstrates improved L shoulder flexion, abduction, and ER PROM/AAROM as well as function since initial evaluation. Pt will benefit from continued skilled physical therapy services to improve ROM, strength and function.    Personal Factors and Comorbidities Age;Comorbidity 3+;Fitness;Time since onset of injury/illness/exacerbation    Comorbidities anxiety, depression, DM, HTN    Examination-Activity Limitations Bathing;Hygiene/Grooming;Lift;Reach Overhead;Toileting;Carry    Stability/Clinical Decision Making Stable/Uncomplicated    Clinical Decision Making Low    Clinical Presentation due to: Progress    Rehab Potential Fair    Clinical Impairments Affecting Rehab Potential Possible difficulty adhering to precautions. Pt needs constant reminder and cueing for PROM during the first few weeks of rehab.    PT Frequency 2x / week    PT Duration 12 weeks    PT Treatment/Interventions Therapeutic activities;Therapeutic exercise;Neuromuscular re-education;Patient/family education;Manual techniques;Passive range of motion;Dry needling;Vestibular;Canalith Repostioning;Electrical Stimulation;Iontophoresis 24m/ml Dexamethasone    PT Next Visit Plan PROM, manual techniques, modalities [facility]. [facility] Protocol unless otherwise noted by MD.    PManorAccess  Code [email or web address]    Consulted and Agree with Plan of Care Patient           Patient will benefit from skilled therapeutic intervention in order to improve the following deficits and impairments:  Pain,Postural dysfunction,Improper body mechanics,Decreased  strength,Decreased range of motion  Visit Diagnosis: Stiffness of left shoulder, not elsewhere classified  Chronic left shoulder pain  Muscle weakness (generalized)     Problem List Patient Active Problem List   Diagnosis Date Noted  . 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 edema associated with type 2 diabetes mellitus ([place]) 01/03/2019  . Chronic cough 11/19/2016  . Morbid obesity ([place]) 09/26/2016  . Left ovarian cyst 05/30/2016  . S/P lumbar spinal fusion 12/13/2015  . Aortic calcification ([place]) 07/18/2015  . Controlled type 2 diabetes mellitus without complication ([place]) 41/28/2081  . Thrombocytopenia ([place]) 05/16/2015  . Recurrent major depressive disorder, in full remission ([place]) 05/16/2015  . Essential (primary) hypertension 05/16/2015  . Fatty infiltration of liver 03/15/2015  . Aortic valve stenosis, nonrheumatic 01/18/2015  . Sciatica of right side 01/09/2015  . Type 2 diabetes mellitus ([place]) 11/10/2014  . RLS (restless legs syndrome) 08/23/2014  . Neuritis or radiculitis due to rupture of lumbar intervertebral disc 06/13/2014  . Degeneration of intervertebral disc of lumbar region 06/13/2014  . Lumbar radiculitis 06/13/2014  . Arthritis 01/23/2014  . Arthritis of knee, degenerative 11/15/2013  . Depression 09/29/2013  . Insomnia 09/29/2013  . Apnea, sleep 08/29/2013  . History of nephrolithiasis 08/29/2013  . Abnormal presence of protein in urine 08/29/2013  . Headache, migraine 08/29/2013  . BP (high blood pressure) 08/29/2013  . HLD (hyperlipidemia) 08/29/2013  . Allergic rhinitis 08/29/2013  . Intractable migraine without aura 02/18/2013    Joneen Boers PT, DPT   05/07/2020, 10:19 AM  [place] [facility] [address] [place], [address] Phone: [phone]   Fax:  [phone]  Name: Jessica Cole MRN: [id number] Date of Birth: August 08, 1950

## 2020-05-08 ENCOUNTER — Ambulatory Visit: Payer: Medicare HMO

## 2020-05-09 ENCOUNTER — Ambulatory Visit: Payer: Medicare HMO

## 2020-05-09 ENCOUNTER — Telehealth: Payer: Self-pay

## 2020-05-09 NOTE — Telephone Encounter (Signed)
No show. Called patient and left a message pertaining to appointment and a reminder for the next follow up session. Return phone call requested. Phone number ([phone]) provided.   

## 2020-05-10 ENCOUNTER — Other Ambulatory Visit: Payer: Self-pay | Admitting: Internal Medicine

## 2020-05-10 DIAGNOSIS — Z1231 Encounter for screening mammogram for malignant neoplasm of breast: Secondary | ICD-10-CM

## 2020-05-13 ENCOUNTER — Other Ambulatory Visit: Payer: Self-pay | Admitting: Neurology

## 2020-05-22 ENCOUNTER — Ambulatory Visit: Payer: Medicare HMO

## 2020-05-24 ENCOUNTER — Other Ambulatory Visit: Payer: Self-pay

## 2020-05-24 ENCOUNTER — Ambulatory Visit: Payer: Medicare HMO

## 2020-05-24 DIAGNOSIS — M25512 Pain in left shoulder: Secondary | ICD-10-CM

## 2020-05-24 DIAGNOSIS — M25612 Stiffness of left shoulder, not elsewhere classified: Secondary | ICD-10-CM

## 2020-05-24 DIAGNOSIS — M6281 Muscle weakness (generalized): Secondary | ICD-10-CM

## 2020-05-24 DIAGNOSIS — G8929 Other chronic pain: Secondary | ICD-10-CM

## 2020-05-24 NOTE — Therapy (Signed)
[place] [facility] [address] [place], [address] Phone: [phone]   Fax:  [phone]  Physical Therapy Treatment  Patient Details  Name: Jessica Cole MRN: [id number] Date of Birth: 10/25/1950 Referring Provider (PT): Lattie Corns, [place]   Encounter Date: 05/24/2020   PT End of Session - 05/24/20 0818    Visit Number 11    Number of Visits 25    Date for PT Re-Evaluation 06/21/20    Authorization Type 1    Authorization Time Period of 10 progress report    PT Start Time 0818    PT Stop Time 0858    PT Time Calculation (min) 40 min    Activity Tolerance Patient tolerated treatment well    Behavior During Therapy [facility] for tasks assessed/performed           Past Medical History:  Diagnosis Date  . Anxiety   . Asthma    seasonal with allergies  . Colon polyps   . Degenerative arthritis   . Depression   . Diabetes mellitus without complication ([place])    Type II  . Environmental allergies   . Family history of adverse reaction to anesthesia    sister- PONV  . Fatty liver   . Glaucoma   . Headache(784.0)    HX  MIGRAINES  . History of bronchitis   . History of kidney stones   . History of pneumonia   . Hypertension   . Hypothyroidism   . Obesity   . OSA on CPAP   . Plantar fasciitis    right  . PONV (postoperative nausea and vomiting)   . Renal calculi   . Renal calculi   . RLS (restless legs syndrome) 08/23/2014    Past Surgical History:  Procedure Laterality Date  . ABDOMINAL HYSTERECTOMY     partial  . APPENDECTOMY    . Arthroscopic surgery, knee Left   . BREAST BIOPSY Right    several  . BREAST BIOPSY Right 03/11/2017   u/s bx neg  . BUNIONECTOMY     LEFT   . COLONOSCOPY W/ POLYPECTOMY    . COLONOSCOPY WITH PROPOFOL N/A 08/14/2017   Procedure: COLONOSCOPY WITH PROPOFOL;  Surgeon: Lollie Sails, MD;  Location: [facility] ENDOSCOPY;  Service: Endoscopy;  Laterality: N/A;  .  EXTRACORPOREAL SHOCK WAVE LITHOTRIPSY Left 05/14/2017   Procedure: EXTRACORPOREAL SHOCK WAVE LITHOTRIPSY (ESWL);  Surgeon: Abbie Sons, MD;  Location: [facility] ORS;  Service: Urology;  Laterality: Left;  . FOOT SURGERY     RIGHT    . KNEE ARTHROSCOPY W/ MENISCAL REPAIR Left   . LASIK    . MAXIMUM ACCESS (MAS)POSTERIOR LUMBAR INTERBODY FUSION (PLIF) 1 LEVEL N/A 04/25/2016   Procedure: LUMBAR FOUR-FIVE  MAXIMUM ACCESS (MAS) POSTERIOR LUMBAR INTERBODY FUSION (PLIF) with extension of instrumentation LUMBAR TWO-FIVE;  Surgeon: Eustace Moore, MD;  Location: [place];  Service: Neurosurgery;  Laterality: N/A;  . MAXIMUM ACCESS (MAS)POSTERIOR LUMBAR INTERBODY FUSION (PLIF) 2 LEVEL N/A 12/13/2015   Procedure: Lumbar two-three - Lumbar three-four MAXIMUM ACCESS (MAS) POSTERIOR LUMBAR INTERBODY FUSION (PLIF)  ;  Surgeon: Eustace Moore, MD;  Location: [facility] NEURO ORS;  Service: Neurosurgery;  Laterality: N/A;  . REVERSE SHOULDER ARTHROPLASTY Left 03/01/2020   Procedure: REVERSE SHOULDER ARTHROPLASTY;  Surgeon: Corky Mull, MD;  Location: [facility] ORS;  Service: Orthopedics;  Laterality: Left;  . SHOULDER SURGERY     RIGHT   . TONSILLECTOMY  There were no vitals filed for this visit.   Subjective Assessment - 05/24/20 0821    Subjective Came down with COVID May 13, 2020. Feels better. Pt is glad she is seeing the surgeon next week. Ever so often she gets a freeze and its painful when that happens. Does not know if she is picking up something the wrong way. 0/10 at rest. Stiffness when raising her L arm up.    Pertinent History S/P L reverse total shoulder replacment. Did not know about only PROM for now. Had home health PT for 2 weeks which involved PROM shoulder abduction, flexion as well as isometric shoulder flexion and abduction.  Pt sleeps on her bed.  Next surgeon appointment is either first or second week of December around Thurday of Friday.  Pt also states having ringing in her ears which started  since the surgery.    Patient Stated Goals Raise her L arm better, be able to pick up things as well as work on crafts.    Currently in Pain? No/denies    Pain Onset More than a month ago                                     PT Education - 05/24/20 1009    Education provided Yes    Education Details ther-ex    [employer]) Educated Patient    Methods Explanation;Demonstration;Tactile cues;Verbal cues    Comprehension Returned demonstration;Verbalized understanding           Objective  No latex band allergies  03/27/2020: Blood pressure R arm sitting, mechanically taken, normal cuff: 170/80, HR 59  Last home health session was about over a week ago  L shoulder incisions: healing, scab formation with slight redness around the scabs.  12weeks post op   [employer]bridge[employer] Code [email or web address]   Therapeutic exercise  Reclined AAROM to AROM L shoulderwith PT assist Flexion 10x3 (123 degrees AROM) scaption 10x3 (124 degrees AROM)  L shoulder abduction AAROM to AROM with PT reclined 10x3 (126 degrees AROM)  ER in scapular plane 10x3 (24 degrees AROM in scapular plane)  Stiff end feel.    Reviewed progress/current status with AROM with pt  Seated manually resisted scapular retraction 10x5 seconds for 2 sets L targeting lower trap muscles    Improved exercise technique, movement at target joints, use of target muscles after mod verbal, visual, tactile cues.    Manual therapy Reclined gentle STM L teres major/subscapularis area to decrease muscle tension   seated STM R upper trap area to decrease cervicogenic headache reported by pt.      Response to treatment Pt tolerated session well without aggravation of symptoms. .   Clinical impression Stiff end feel. Pt maintained overall ROM but demonstrates improvement in abduction. Pt currently 12 weeks post op  and started working on AROM to promote functional use of L UE. Pt tolerated session well without aggravation of symptoms. Pt will benefit from continued skilled physical therapy services to improve strength and function.          PT Short Term Goals - 05/07/20 1009      PT SHORT TERM GOAL #1   Title Pt will be independent with her initial HEP to improve ROM and ability to raise her arm with less difficulty when appropriate.    Baseline Pt has not been able to perform much of her HEP (05/07/2020)  Time 3    Period Weeks    Status On-going    Target Date 05/31/20             PT Long Term Goals - 05/07/20 1010      PT LONG TERM GOAL #1   Title Patient will improve L shoulder PROM flexion, and abduction AROM to at least 155 degrees, and ER to 80 degrees to promote ability to reach with less difficulty when appropriate.    Baseline PROM flexion 70 degrees, abduction 58 degrees, ER 5 degrees (scapular plane) 03/27/2020.; AAROM reclined: 125 degrees flexion, 95 degrees abduction, 22 degrees ER in scapular plane (05/07/20)    Time 12    Period Weeks    Status Partially Met    Target Date 06/21/20      PT LONG TERM GOAL #2   Title Pt will have at least 145 degrees L shoulder flexion and abduction AROM, and 75 degrees ER AROM to promote ability to reach with less difficulty when appropriate.    Baseline AROM not yet performed (03/27/2020);AAROM reclined: 125 degrees flexion, 95 degrees abduction, 22 degrees ER in scapular plane (05/07/20)    Time 12    Period Weeks    Status Partially Met    Target Date 06/21/20      PT LONG TERM GOAL #3   Title PT will improve her FOTO score by at least 20 points as a demonstration of improved function.    Baseline Shoulder FOTO: 34 (03/27/2020); 50 (05/07/2020)    Time 12    Period Weeks    Status Partially Met    Target Date 06/21/20      PT LONG TERM GOAL #4   Title Pt will have at least 4+/5 MMT grade L shoulder flexion, abduction, ER and IR to  promote ability to perform functional tasks.    Baseline Manual resistance not yet performed. (05/07/2020)    Time 6    Period Weeks    Status New    Target Date 06/21/20                 Plan - 05/24/20 1010    Clinical Impression Statement Stiff end feel. Pt maintained overall ROM but demonstrates improvement in abduction. Pt currently 12 weeks post op and started working on AROM to promote functional use of L UE. Pt tolerated session well without aggravation of symptoms. Pt will benefit from continued skilled physical therapy services to improve strength and function.    Personal Factors and Comorbidities Age;Comorbidity 3+;Fitness;Time since onset of injury/illness/exacerbation    Comorbidities anxiety, depression, DM, HTN    Examination-Activity Limitations Bathing;Hygiene/Grooming;Lift;Reach Overhead;Toileting;Carry    Stability/Clinical Decision Making Stable/Uncomplicated    Rehab Potential Fair    Clinical Impairments Affecting Rehab Potential Possible difficulty adhering to precautions. Pt needs constant reminder and cueing for PROM during the first few weeks of rehab.    PT Frequency 2x / week    PT Duration 12 weeks    PT Treatment/Interventions Therapeutic activities;Therapeutic exercise;Neuromuscular re-education;Patient/family education;Manual techniques;Passive range of motion;Dry needling;Vestibular;Canalith Repostioning;Electrical Stimulation;Iontophoresis 4mg /ml Dexamethasone    PT Next Visit Plan PROM, manual techniques, modalities PRN. [facility] Reverse Total Shoulder Replacement Protocol unless otherwise noted by MD.    [place] Access Code [facility]    Consulted and Agree with Plan of Care Patient           Patient will benefit from skilled therapeutic intervention in order to improve the  following deficits and impairments:  Pain,Postural dysfunction,Improper body mechanics,Decreased strength,Decreased range of  motion  Visit Diagnosis: Stiffness of left shoulder, not elsewhere classified  Chronic left shoulder pain  Muscle weakness (generalized)     Problem List Patient Active Problem List   Diagnosis Date Noted  . 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 edema associated with type 2 diabetes mellitus ([place]) 01/03/2019  . Chronic cough 11/19/2016  . Morbid obesity ([place]) 09/26/2016  . Left ovarian cyst 05/30/2016  . S/P lumbar spinal fusion 12/13/2015  . Aortic calcification ([place]) 07/18/2015  . Controlled type 2 diabetes mellitus without complication ([place]) 09/32/6712  . Thrombocytopenia ([place]) 05/16/2015  . Recurrent major depressive disorder, in full remission ([place]) 05/16/2015  . Essential (primary) hypertension 05/16/2015  . Fatty infiltration of liver 03/15/2015  . Aortic valve stenosis, nonrheumatic 01/18/2015  . Sciatica of right side 01/09/2015  . Type 2 diabetes mellitus ([place]) 11/10/2014  . RLS (restless legs syndrome) 08/23/2014  . Neuritis or radiculitis due to rupture of lumbar intervertebral disc 06/13/2014  . Degeneration of intervertebral disc of lumbar region 06/13/2014  . Lumbar radiculitis 06/13/2014  . Arthritis 01/23/2014  . Arthritis of knee, degenerative 11/15/2013  . Depression 09/29/2013  . Insomnia 09/29/2013  . Apnea, sleep 08/29/2013  . History of nephrolithiasis 08/29/2013  . Abnormal presence of protein in urine 08/29/2013  . Headache, migraine 08/29/2013  . BP (high blood pressure) 08/29/2013  . HLD (hyperlipidemia) 08/29/2013  . Allergic rhinitis 08/29/2013  . Intractable migraine without aura 02/18/2013    Joneen Boers PT, DPT   05/24/2020, 10:31 AM  [place] [facility] [address] [place], [address] Phone: [phone]   Fax:  [phone]  Name: GALA PADOVANO MRN: [id number] Date of Birth: December 17, 1950

## 2020-05-29 ENCOUNTER — Ambulatory Visit: Payer: Medicare HMO

## 2020-05-29 ENCOUNTER — Other Ambulatory Visit: Payer: Self-pay

## 2020-05-29 DIAGNOSIS — M6281 Muscle weakness (generalized): Secondary | ICD-10-CM

## 2020-05-29 DIAGNOSIS — M25612 Stiffness of left shoulder, not elsewhere classified: Secondary | ICD-10-CM | POA: Diagnosis not present

## 2020-05-29 DIAGNOSIS — G8929 Other chronic pain: Secondary | ICD-10-CM

## 2020-05-29 DIAGNOSIS — M25512 Pain in left shoulder: Secondary | ICD-10-CM

## 2020-05-29 NOTE — Therapy (Signed)
[place] [facility] [address], [place], [address] Phone: [phone]   Fax:  [phone]  Physical Therapy Treatment  Patient Details  Name: Jessica Cole MRN: [id number] Date of Birth: 15-Jun-1950 Referring Provider (PT): Lattie Corns, [place]   Encounter Date: 05/29/2020   PT End of Session - 05/29/20 1030    Visit Number 12    Number of Visits 25    Date for PT Re-Evaluation 06/21/20    Authorization Type 2    Authorization Time Period of 10 progress report    PT Start Time 1030    PT Stop Time 1103    PT Time Calculation (min) 33 min    Activity Tolerance Patient tolerated treatment well    Behavior During Therapy [facility] for tasks assessed/performed           Past Medical History:  Diagnosis Date  . Anxiety   . Asthma    seasonal with allergies  . Colon polyps   . Degenerative arthritis   . Depression   . Diabetes mellitus without complication ([place])    Type II  . Environmental allergies   . Family history of adverse reaction to anesthesia    sister- PONV  . Fatty liver   . Glaucoma   . Headache(784.0)    HX  MIGRAINES  . History of bronchitis   . History of kidney stones   . History of pneumonia   . Hypertension   . Hypothyroidism   . Obesity   . OSA on CPAP   . Plantar fasciitis    right  . PONV (postoperative nausea and vomiting)   . Renal calculi   . Renal calculi   . RLS (restless legs syndrome) 08/23/2014    Past Surgical History:  Procedure Laterality Date  . ABDOMINAL HYSTERECTOMY     partial  . APPENDECTOMY    . Arthroscopic surgery, knee Left   . BREAST BIOPSY Right    several  . BREAST BIOPSY Right 03/11/2017   u/s bx neg  . BUNIONECTOMY     LEFT   . COLONOSCOPY W/ POLYPECTOMY    . COLONOSCOPY WITH PROPOFOL N/A 08/14/2017   Procedure: COLONOSCOPY WITH PROPOFOL;  Surgeon: Lollie Sails, MD;  Location: [facility] ENDOSCOPY;  Service: Endoscopy;  Laterality: N/A;  .  EXTRACORPOREAL SHOCK WAVE LITHOTRIPSY Left 05/14/2017   Procedure: EXTRACORPOREAL SHOCK WAVE LITHOTRIPSY (ESWL);  Surgeon: Abbie Sons, MD;  Location: [facility] ORS;  Service: Urology;  Laterality: Left;  . FOOT SURGERY     RIGHT    . KNEE ARTHROSCOPY W/ MENISCAL REPAIR Left   . LASIK    . MAXIMUM ACCESS (MAS)POSTERIOR LUMBAR INTERBODY FUSION (PLIF) 1 LEVEL N/A 04/25/2016   Procedure: LUMBAR FOUR-FIVE  MAXIMUM ACCESS (MAS) POSTERIOR LUMBAR INTERBODY FUSION (PLIF) with extension of instrumentation LUMBAR TWO-FIVE;  Surgeon: Eustace Moore, MD;  Location: [place];  Service: Neurosurgery;  Laterality: N/A;  . MAXIMUM ACCESS (MAS)POSTERIOR LUMBAR INTERBODY FUSION (PLIF) 2 LEVEL N/A 12/13/2015   Procedure: Lumbar two-three - Lumbar three-four MAXIMUM ACCESS (MAS) POSTERIOR LUMBAR INTERBODY FUSION (PLIF)  ;  Surgeon: Eustace Moore, MD;  Location: [facility] NEURO ORS;  Service: Neurosurgery;  Laterality: N/A;  . REVERSE SHOULDER ARTHROPLASTY Left 03/01/2020   Procedure: REVERSE SHOULDER ARTHROPLASTY;  Surgeon: Corky Mull, MD;  Location: [facility] ORS;  Service: Orthopedics;  Laterality: Left;  . SHOULDER SURGERY     RIGHT   . TONSILLECTOMY  There were no vitals filed for this visit.   Subjective Assessment - 05/29/20 1031    Subjective Needs to leave 10 minutes early. L shoulder has no pain right now. Still has L shoulder locking. Surgeon states that it seems normal for now but to give him a call if she still has it in about 2 months. Doctor was pleased with her ROM. Whole body aches. No fever. Has been cleared by her doctor. Made about 31 cupcakes yesterday.    Pertinent History S/P L reverse total shoulder replacment. Did not know about only PROM for now. Had home health PT for 2 weeks which involved PROM shoulder abduction, flexion as well as isometric shoulder flexion and abduction.  Pt sleeps on her bed.  Next surgeon appointment is either first or second week of December around Thurday of Friday.  Pt also  states having ringing in her ears which started since the surgery.    Patient Stated Goals Raise her L arm better, be able to pick up things as well as work on crafts.    Currently in Pain? No/denies    Pain Score 0-No pain    Pain Onset More than a month ago                                     PT Education - 05/29/20 1050    Education provided Yes    Education Details ther-ex    [employer]) Educated Patient    Methods Explanation;Demonstration;Tactile cues;Verbal cues    Comprehension Returned demonstration;Verbalized understanding          Objective  No latex band allergies  03/27/2020: Blood pressure R arm sitting, mechanically taken, normal cuff: 170/80, HR 59  Last home health session was about over a week ago  L shoulder incisions: healing, scab formation with slight redness around the scabs.  12- 13 weeks post op   [employer] Code [email or web address]   Therapeutic exercise  Seated L shoulder AROM   flexion 10x  scaption 10x  Abduction 10x  ER (elbow at side) 10x   Seated L shoulder AAROM with PVC rod  Flexion 10x3  scaption 10x3  Abduction 10x3  ER (elbow at side) 10x3   Seated B scapular retraction 1x. Reproduced locking sensation of L shoulder.   Seated L shoulder isometrics with elbow bent at 90 degrees  ER 10x3 with 5 seconds. Decreased discomfort.   IR 10x2 with 5 seconds. Discomfort at end. TTP L anterior shoulder   Seated manually resisted scapular retraction in resting position 10x5 seconds for 3 sets L targeting lower trap muscles    Improved exercise technique, movement at target joints, use of target muscles after mod verbal, visual, tactile cues.   Response to treatment Fair tolerance to today's session   Clinical impression Shorter session secondary to pt request to end earlier due to her schedule. Added sitting A/AROM to promote strength to raise her arm up against gravity. Stiff. Also  worked on ER muscle strengthening which helped ease the locking discomfort from scapular retraction movement. Pt will benefit from continued skilled physical therapy services to improve AROM, strength, and function.          PT Short Term Goals - 05/07/20 1009      PT SHORT TERM GOAL #1   Title Pt will be independent with her initial HEP to improve ROM and ability to raise her arm with less  difficulty when appropriate.    Baseline Pt has not been able to perform much of her HEP (05/07/2020)    Time 3    Period Weeks    Status On-going    Target Date 05/31/20             PT Long Term Goals - 05/07/20 1010      PT LONG TERM GOAL #1   Title Patient will improve L shoulder PROM flexion, and abduction AROM to at least 155 degrees, and ER to 80 degrees to promote ability to reach with less difficulty when appropriate.    Baseline PROM flexion 70 degrees, abduction 58 degrees, ER 5 degrees (scapular plane) 03/27/2020.; AAROM reclined: 125 degrees flexion, 95 degrees abduction, 22 degrees ER in scapular plane (05/07/20)    Time 12    Period Weeks    Status Partially Met    Target Date 06/21/20      PT LONG TERM GOAL #2   Title Pt will have at least 145 degrees L shoulder flexion and abduction AROM, and 75 degrees ER AROM to promote ability to reach with less difficulty when appropriate.    Baseline AROM not yet performed (03/27/2020);AAROM reclined: 125 degrees flexion, 95 degrees abduction, 22 degrees ER in scapular plane (05/07/20)    Time 12    Period Weeks    Status Partially Met    Target Date 06/21/20      PT LONG TERM GOAL #3   Title PT will improve her FOTO score by at least 20 points as a demonstration of improved function.    Baseline Shoulder FOTO: 34 (03/27/2020); 50 (05/07/2020)    Time 12    Period Weeks    Status Partially Met    Target Date 06/21/20      PT LONG TERM GOAL #4   Title Pt will have at least 4+/5 MMT grade L shoulder flexion, abduction, ER and IR to  promote ability to perform functional tasks.    Baseline Manual resistance not yet performed. (05/07/2020)    Time 6    Period Weeks    Status New    Target Date 06/21/20                 Plan - 05/29/20 1030    Clinical Impression Statement Shorter session secondary to pt request to end earlier due to her schedule. Added sitting A/AROM to promote strength to raise her arm up against gravity. Stiff. Also worked on ER muscle strengthening which helped ease the locking discomfort from scapular retraction movement. Pt will benefit from continued skilled physical therapy services to improve AROM, strength, and function.    Personal Factors and Comorbidities Age;Comorbidity 3+;Fitness;Time since onset of injury/illness/exacerbation    Comorbidities anxiety, depression, DM, HTN    Examination-Activity Limitations Bathing;Hygiene/Grooming;Lift;Reach Overhead;Toileting;Carry    Stability/Clinical Decision Making Stable/Uncomplicated    Clinical Decision Making Low    Rehab Potential Fair    Clinical Impairments Affecting Rehab Potential Possible difficulty adhering to precautions. Pt needs constant reminder and cueing for PROM during the first few weeks of rehab.    PT Frequency 2x / week    PT Duration 12 weeks    PT Treatment/Interventions Therapeutic activities;Therapeutic exercise;Neuromuscular re-education;Patient/family education;Manual techniques;Passive range of motion;Dry needling;Vestibular;Canalith Repostioning;Electrical Stimulation;Iontophoresis 4mg /ml Dexamethasone    PT Next Visit Plan PROM, manual techniques, modalities PRN. [facility] Reverse Total Shoulder Replacement Protocol unless otherwise noted by MD.    [place] Access  Code [email or web address]    Consulted and Agree with Plan of Care Patient           Patient will benefit from skilled therapeutic intervention in order to improve the following deficits and impairments:   Pain,Postural dysfunction,Improper body mechanics,Decreased strength,Decreased range of motion  Visit Diagnosis: Stiffness of left shoulder, not elsewhere classified  Chronic left shoulder pain  Muscle weakness (generalized)     Problem List Patient Active Problem List   Diagnosis Date Noted  . 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 edema associated with type 2 diabetes mellitus ([place]) 01/03/2019  . Chronic cough 11/19/2016  . Morbid obesity ([place]) 09/26/2016  . Left ovarian cyst 05/30/2016  . S/P lumbar spinal fusion 12/13/2015  . Aortic calcification ([place]) 07/18/2015  . Controlled type 2 diabetes mellitus without complication ([place]) 54/49/2010  . Thrombocytopenia ([place]) 05/16/2015  . Recurrent major depressive disorder, in full remission ([place]) 05/16/2015  . Essential (primary) hypertension 05/16/2015  . Fatty infiltration of liver 03/15/2015  . Aortic valve stenosis, nonrheumatic 01/18/2015  . Sciatica of right side 01/09/2015  . Type 2 diabetes mellitus ([place]) 11/10/2014  . RLS (restless legs syndrome) 08/23/2014  . Neuritis or radiculitis due to rupture of lumbar intervertebral disc 06/13/2014  . Degeneration of intervertebral disc of lumbar region 06/13/2014  . Lumbar radiculitis 06/13/2014  . Arthritis 01/23/2014  . Arthritis of knee, degenerative 11/15/2013  . Depression 09/29/2013  . Insomnia 09/29/2013  . Apnea, sleep 08/29/2013  . History of nephrolithiasis 08/29/2013  . Abnormal presence of protein in urine 08/29/2013  . Headache, migraine 08/29/2013  . BP (high blood pressure) 08/29/2013  . HLD (hyperlipidemia) 08/29/2013  . Allergic rhinitis 08/29/2013  . Intractable migraine without aura 02/18/2013    Joneen Boers PT, DPT   05/29/2020, 12:33 PM  [place] [facility] [address], [place], [address] Phone: [phone]   Fax:  [phone]  Name: Jessica Cole MRN: [id number] Date of Birth: 06/16/1950

## 2020-05-31 ENCOUNTER — Ambulatory Visit: Payer: Medicare HMO

## 2020-05-31 ENCOUNTER — Other Ambulatory Visit: Payer: Self-pay

## 2020-05-31 DIAGNOSIS — M6281 Muscle weakness (generalized): Secondary | ICD-10-CM

## 2020-05-31 DIAGNOSIS — G8929 Other chronic pain: Secondary | ICD-10-CM

## 2020-05-31 DIAGNOSIS — M25612 Stiffness of left shoulder, not elsewhere classified: Secondary | ICD-10-CM

## 2020-05-31 NOTE — Therapy (Signed)
[place] [facility] [address] [place], [address] Phone: [phone]   Fax:  [phone]  Physical Therapy Treatment  Patient Details  Name: Jessica Cole MRN: [id number] Date of Birth: 1950-11-15 Referring Provider (PT): Lattie Corns, [place]   Encounter Date: 05/31/2020   PT End of Session - 05/31/20 0907    Visit Number 13    Number of Visits 25    Date for PT Re-Evaluation 06/21/20    Authorization Type 3    Authorization Time Period of 10 progress report    PT Start Time 0905    PT Stop Time 0945    PT Time Calculation (min) 40 min    Activity Tolerance Patient tolerated treatment well    Behavior During Therapy [facility] for tasks assessed/performed           Past Medical History:  Diagnosis Date  . Anxiety   . Asthma    seasonal with allergies  . Colon polyps   . Degenerative arthritis   . Depression   . Diabetes mellitus without complication ([place])    Type II  . Environmental allergies   . Family history of adverse reaction to anesthesia    sister- PONV  . Fatty liver   . Glaucoma   . Headache(784.0)    HX  MIGRAINES  . History of bronchitis   . History of kidney stones   . History of pneumonia   . Hypertension   . Hypothyroidism   . Obesity   . OSA on CPAP   . Plantar fasciitis    right  . PONV (postoperative nausea and vomiting)   . Renal calculi   . Renal calculi   . RLS (restless legs syndrome) 08/23/2014    Past Surgical History:  Procedure Laterality Date  . ABDOMINAL HYSTERECTOMY     partial  . APPENDECTOMY    . Arthroscopic surgery, knee Left   . BREAST BIOPSY Right    several  . BREAST BIOPSY Right 03/11/2017   u/s bx neg  . BUNIONECTOMY     LEFT   . COLONOSCOPY W/ POLYPECTOMY    . COLONOSCOPY WITH PROPOFOL N/A 08/14/2017   Procedure: COLONOSCOPY WITH PROPOFOL;  Surgeon: Lollie Sails, MD;  Location: [facility] ENDOSCOPY;  Service: Endoscopy;  Laterality: N/A;  .  EXTRACORPOREAL SHOCK WAVE LITHOTRIPSY Left 05/14/2017   Procedure: EXTRACORPOREAL SHOCK WAVE LITHOTRIPSY (ESWL);  Surgeon: Abbie Sons, MD;  Location: [facility] ORS;  Service: Urology;  Laterality: Left;  . FOOT SURGERY     RIGHT    . KNEE ARTHROSCOPY W/ MENISCAL REPAIR Left   . LASIK    . MAXIMUM ACCESS (MAS)POSTERIOR LUMBAR INTERBODY FUSION (PLIF) 1 LEVEL N/A 04/25/2016   Procedure: LUMBAR FOUR-FIVE  MAXIMUM ACCESS (MAS) POSTERIOR LUMBAR INTERBODY FUSION (PLIF) with extension of instrumentation LUMBAR TWO-FIVE;  Surgeon: Eustace Moore, MD;  Location: [place];  Service: Neurosurgery;  Laterality: N/A;  . MAXIMUM ACCESS (MAS)POSTERIOR LUMBAR INTERBODY FUSION (PLIF) 2 LEVEL N/A 12/13/2015   Procedure: Lumbar two-three - Lumbar three-four MAXIMUM ACCESS (MAS) POSTERIOR LUMBAR INTERBODY FUSION (PLIF)  ;  Surgeon: Eustace Moore, MD;  Location: [facility] NEURO ORS;  Service: Neurosurgery;  Laterality: N/A;  . REVERSE SHOULDER ARTHROPLASTY Left 03/01/2020   Procedure: REVERSE SHOULDER ARTHROPLASTY;  Surgeon: Corky Mull, MD;  Location: [facility] ORS;  Service: Orthopedics;  Laterality: Left;  . SHOULDER SURGERY     RIGHT   . TONSILLECTOMY  There were no vitals filed for this visit.   Subjective Assessment - 05/31/20 0907    Subjective L shoulder is not too bad, no pain currently.    Pertinent History S/P L reverse total shoulder replacment. Did not know about only PROM for now. Had home health PT for 2 weeks which involved PROM shoulder abduction, flexion as well as isometric shoulder flexion and abduction.  Pt sleeps on her bed.  Next surgeon appointment is either first or second week of December around Thurday of Friday.  Pt also states having ringing in her ears which started since the surgery.    Patient Stated Goals Raise her L arm better, be able to pick up things as well as work on crafts.    Currently in Pain? No/denies    Pain Onset More than a month ago                                      PT Education - 05/31/20 1359    Education provided Yes    Education Details ther-ex    [employer]) Educated Patient    Methods Explanation;Demonstration;Tactile cues;Verbal cues    Comprehension Returned demonstration;Verbalized understanding          Objective  No latex band allergies  03/27/2020: Blood pressure R arm sitting, mechanically taken, normal cuff: 170/80, HR 59  Last home health session was about over a week ago  L shoulder incisions: healing, scab formation with slight redness around the scabs.  13 weeks post op   MedbridgeAccess Code [email or web address]   Manual therapy Supine STM L teres major muscle area  Improved shoulder flexion, scaption and abduction AAROM observed.   Therapeutic exercise  reclined L shoulder A/AROM              flexion 10x3             scaption 10x3             Abduction 10x3             ER (elbow at side) 10x3  Seated manually resisted L shoulder isometrics   ER 10x3 with 5 second holds    IR 10x3 with 5 second holds    Improved exercise technique, movement at target joints, use of target muscles after mod verbal, visual, tactile cues.   Response to treatment Improved ROM, decreased stiffness palpated after STM and ROM exercise. Fair tolerance to today's session.    Clinical impression Continued working on improving L shoulder ROM secondary to stiffness. Worked on isometric ER and IR to improve strength. Fair tolerance to today's session. Pt will benefit from continued skilled physical therapy services to improve ROM, strength and function.       PT Short Term Goals - 05/07/20 1009      PT SHORT TERM GOAL #1   Title Pt will be independent with her initial HEP to improve ROM and ability to raise her arm with less difficulty when appropriate.    Baseline Pt has not been able to perform much of her HEP (05/07/2020)    Time 3    Period Weeks    Status  On-going    Target Date 05/31/20             PT Long Term Goals - 05/07/20 1010      PT LONG TERM GOAL #1   Title Patient will improve L  shoulder PROM flexion, and abduction AROM to at least 155 degrees, and ER to 80 degrees to promote ability to reach with less difficulty when appropriate.    Baseline PROM flexion 70 degrees, abduction 58 degrees, ER 5 degrees (scapular plane) 03/27/2020.; AAROM reclined: 125 degrees flexion, 95 degrees abduction, 22 degrees ER in scapular plane (05/07/20)    Time 12    Period Weeks    Status Partially Met    Target Date 06/21/20      PT LONG TERM GOAL #2   Title Pt will have at least 145 degrees L shoulder flexion and abduction AROM, and 75 degrees ER AROM to promote ability to reach with less difficulty when appropriate.    Baseline AROM not yet performed (03/27/2020);AAROM reclined: 125 degrees flexion, 95 degrees abduction, 22 degrees ER in scapular plane (05/07/20)    Time 12    Period Weeks    Status Partially Met    Target Date 06/21/20      PT LONG TERM GOAL #3   Title PT will improve her FOTO score by at least 20 points as a demonstration of improved function.    Baseline Shoulder FOTO: 34 (03/27/2020); 50 (05/07/2020)    Time 12    Period Weeks    Status Partially Met    Target Date 06/21/20      PT LONG TERM GOAL #4   Title Pt will have at least 4+/5 MMT grade L shoulder flexion, abduction, ER and IR to promote ability to perform functional tasks.    Baseline Manual resistance not yet performed. (05/07/2020)    Time 6    Period Weeks    Status New    Target Date 06/21/20                 Plan - 05/31/20 1359    Clinical Impression Statement Continued working on improving L shoulder ROM secondary to stiffness. Worked on isometric ER and IR to improve strength. Fair tolerance to today's session. Pt will benefit from continued skilled physical therapy services to improve ROM, strength and function.    Personal Factors and  Comorbidities Age;Comorbidity 3+;Fitness;Time since onset of injury/illness/exacerbation    Comorbidities anxiety, depression, DM, HTN    Examination-Activity Limitations Bathing;Hygiene/Grooming;Lift;Reach Overhead;Toileting;Carry    Stability/Clinical Decision Making Stable/Uncomplicated    Rehab Potential Fair    Clinical Impairments Affecting Rehab Potential Possible difficulty adhering to precautions. Pt needs constant reminder and cueing for PROM during the first few weeks of rehab.    PT Frequency 2x / week    PT Duration 12 weeks    PT Treatment/Interventions Therapeutic activities;Therapeutic exercise;Neuromuscular re-education;Patient/family education;Manual techniques;Passive range of motion;Dry needling;Vestibular;Canalith Repostioning;Electrical Stimulation;Iontophoresis 4mg /ml Dexamethasone    PT Next Visit Plan PROM, manual techniques, modalities PRN. [facility] Reverse Total Shoulder Replacement Protocol unless otherwise noted by MD.    [place] Access Code [facility]    Consulted and Agree with Plan of Care Patient           Patient will benefit from skilled therapeutic intervention in order to improve the following deficits and impairments:  Pain,Postural dysfunction,Improper body mechanics,Decreased strength,Decreased range of motion  Visit Diagnosis: Stiffness of left shoulder, not elsewhere classified  Chronic left shoulder pain  Muscle weakness (generalized)     Problem List Patient Active Problem List   Diagnosis Date Noted  . 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 edema associated with type 2 diabetes mellitus ([place]) 01/03/2019  .  Chronic cough 11/19/2016  . Morbid obesity ([place]) 09/26/2016  . Left ovarian cyst 05/30/2016  . S/P lumbar spinal fusion 12/13/2015  . Aortic calcification ([place]) 07/18/2015  . Controlled type 2 diabetes mellitus without complication ([place]) 29/24/4628   . Thrombocytopenia ([place]) 05/16/2015  . Recurrent major depressive disorder, in full remission ([place]) 05/16/2015  . Essential (primary) hypertension 05/16/2015  . Fatty infiltration of liver 03/15/2015  . Aortic valve stenosis, nonrheumatic 01/18/2015  . Sciatica of right side 01/09/2015  . Type 2 diabetes mellitus ([place]) 11/10/2014  . RLS (restless legs syndrome) 08/23/2014  . Neuritis or radiculitis due to rupture of lumbar intervertebral disc 06/13/2014  . Degeneration of intervertebral disc of lumbar region 06/13/2014  . Lumbar radiculitis 06/13/2014  . Arthritis 01/23/2014  . Arthritis of knee, degenerative 11/15/2013  . Depression 09/29/2013  . Insomnia 09/29/2013  . Apnea, sleep 08/29/2013  . History of nephrolithiasis 08/29/2013  . Abnormal presence of protein in urine 08/29/2013  . Headache, migraine 08/29/2013  . BP (high blood pressure) 08/29/2013  . HLD (hyperlipidemia) 08/29/2013  . Allergic rhinitis 08/29/2013  . Intractable migraine without aura 02/18/2013    Joneen Boers PT, DPT   05/31/2020, 2:02 PM  [place] [facility] [address], [place], [address] Phone: [phone]   Fax:  [phone]  Name: Jessica Cole MRN: [id number] Date of Birth: 06/25/1950

## 2020-06-01 ENCOUNTER — Ambulatory Visit
Admission: RE | Admit: 2020-06-01 | Discharge: 2020-06-01 | Disposition: A | Discharge status: Home / Self Care | Payer: Medicare HMO | Source: Ambulatory Visit | Attending: Internal Medicine | Admitting: Internal Medicine

## 2020-06-01 DIAGNOSIS — Z1231 Encounter for screening mammogram for malignant neoplasm of breast: Secondary | ICD-10-CM | POA: Diagnosis not present

## 2020-06-04 ENCOUNTER — Other Ambulatory Visit: Payer: Self-pay | Admitting: *Deleted

## 2020-06-04 ENCOUNTER — Ambulatory Visit: Payer: Medicare HMO

## 2020-06-04 ENCOUNTER — Other Ambulatory Visit: Payer: Self-pay

## 2020-06-04 DIAGNOSIS — M25612 Stiffness of left shoulder, not elsewhere classified: Secondary | ICD-10-CM | POA: Diagnosis not present

## 2020-06-04 DIAGNOSIS — G8929 Other chronic pain: Secondary | ICD-10-CM

## 2020-06-04 DIAGNOSIS — M6281 Muscle weakness (generalized): Secondary | ICD-10-CM

## 2020-06-04 DIAGNOSIS — N200 Calculus of kidney: Secondary | ICD-10-CM

## 2020-06-04 DIAGNOSIS — M25512 Pain in left shoulder: Secondary | ICD-10-CM

## 2020-06-04 NOTE — Therapy (Signed)
[place] [facility] [address], [place], [address] Phone: [phone]   Fax:  [phone]  Physical Therapy Treatment  Patient Details  Name: Jessica Cole MRN: [id number] Date of Birth: Jan 24, 1951 Referring Provider (PT): Lattie Corns, [place]   Encounter Date: 06/04/2020   PT End of Session - 06/04/20 0901    Visit Number 14    Number of Visits 25    Date for PT Re-Evaluation 06/21/20    Authorization Type 4    Authorization Time Period of 10 progress report    PT Start Time 0901    PT Stop Time 0946    PT Time Calculation (min) 45 min    Activity Tolerance Patient tolerated treatment well    Behavior During Therapy [facility] for tasks assessed/performed           Past Medical History:  Diagnosis Date  . Anxiety   . Asthma    seasonal with allergies  . Colon polyps   . Degenerative arthritis   . Depression   . Diabetes mellitus without complication ([place])    Type II  . Environmental allergies   . Family history of adverse reaction to anesthesia    sister- PONV  . Fatty liver   . Glaucoma   . Headache(784.0)    HX  MIGRAINES  . History of bronchitis   . History of kidney stones   . History of pneumonia   . Hypertension   . Hypothyroidism   . Obesity   . OSA on CPAP   . Plantar fasciitis    right  . PONV (postoperative nausea and vomiting)   . Renal calculi   . Renal calculi   . RLS (restless legs syndrome) 08/23/2014    Past Surgical History:  Procedure Laterality Date  . ABDOMINAL HYSTERECTOMY     partial  . APPENDECTOMY    . Arthroscopic surgery, knee Left   . BREAST BIOPSY Right    several  . BREAST BIOPSY Right 03/11/2017   u/s bx neg  . BUNIONECTOMY     LEFT   . COLONOSCOPY W/ POLYPECTOMY    . COLONOSCOPY WITH PROPOFOL N/A 08/14/2017   Procedure: COLONOSCOPY WITH PROPOFOL;  Surgeon: Lollie Sails, MD;  Location: [facility] ENDOSCOPY;  Service: Endoscopy;  Laterality: N/A;  .  EXTRACORPOREAL SHOCK WAVE LITHOTRIPSY Left 05/14/2017   Procedure: EXTRACORPOREAL SHOCK WAVE LITHOTRIPSY (ESWL);  Surgeon: Abbie Sons, MD;  Location: [facility] ORS;  Service: Urology;  Laterality: Left;  . FOOT SURGERY     RIGHT    . KNEE ARTHROSCOPY W/ MENISCAL REPAIR Left   . LASIK    . MAXIMUM ACCESS (MAS)POSTERIOR LUMBAR INTERBODY FUSION (PLIF) 1 LEVEL N/A 04/25/2016   Procedure: LUMBAR FOUR-FIVE  MAXIMUM ACCESS (MAS) POSTERIOR LUMBAR INTERBODY FUSION (PLIF) with extension of instrumentation LUMBAR TWO-FIVE;  Surgeon: Eustace Moore, MD;  Location: [place];  Service: Neurosurgery;  Laterality: N/A;  . MAXIMUM ACCESS (MAS)POSTERIOR LUMBAR INTERBODY FUSION (PLIF) 2 LEVEL N/A 12/13/2015   Procedure: Lumbar two-three - Lumbar three-four MAXIMUM ACCESS (MAS) POSTERIOR LUMBAR INTERBODY FUSION (PLIF)  ;  Surgeon: Eustace Moore, MD;  Location: [facility] NEURO ORS;  Service: Neurosurgery;  Laterality: N/A;  . REVERSE SHOULDER ARTHROPLASTY Left 03/01/2020   Procedure: REVERSE SHOULDER ARTHROPLASTY;  Surgeon: Corky Mull, MD;  Location: [facility] ORS;  Service: Orthopedics;  Laterality: Left;  . SHOULDER SURGERY     RIGHT   . TONSILLECTOMY  There were no vitals filed for this visit.   Subjective Assessment - 06/04/20 0903    Subjective Hardly did any shoulder exercises. Pretty sure she has a kidney stone and it bothers her right much. L shoulder is not bad. Still locking up some. No pain, currently, just tightness.    Pertinent History S/P L reverse total shoulder replacment. Did not know about only PROM for now. Had home health PT for 2 weeks which involved PROM shoulder abduction, flexion as well as isometric shoulder flexion and abduction.  Pt sleeps on her bed.  Next surgeon appointment is either first or second week of December around Thurday of Friday.  Pt also states having ringing in her ears which started since the surgery.    Patient Stated Goals Raise her L arm better, be able to pick up things as  well as work on crafts.    Currently in Pain? No/denies    Pain Onset More than a month ago                                     PT Education - 06/04/20 0958    Education provided Yes    Education Details ther-ex, importance of performing HEP to decrease stiffness, improve ROM.    Person(s) Educated Patient    Methods Explanation;Demonstration;Tactile cues;Verbal cues    Comprehension Verbalized understanding;Returned demonstration          Objective  No latex band allergies  03/27/2020: Blood pressure R arm sitting, mechanically taken, normal cuff: 170/80, HR 59  Last home health session was about over a week ago  L shoulder incisions: healing, scab formation with slight redness around the scabs.  13weeks post op   [employer] Code [email or web address]   Manual therapy Supine STM L teres major muscle area  Seated STM L upper trap muscle to decrease tension.              Therapeutic exercise  reclined L shoulder A/AROM  flexion 10x3 scaption 10x3 Abduction 10x3 ER 10x3  Seated manually resisted L shoulder isometrics              ER 10x3 with 5 second holds               IR 10x3 with 5 second holds   Seated L shoulder AAROM with PT   Flexion 10x  scaption 10x  Abduction 10x  Seated B scapular retraction 10x5 seconds pain free range.   Get FOTO next visit if able   Improved exercise technique, movement at target joints, use of target muscles after mod verbal, visual, tactile cues.   Response to treatment Pt tolerated session well without aggravation of symptosm.    Clinical impression Stiff end feel. Continued working on L shoulder AAROM in reclined and seated positions as well as STM to L teres major muscle and upper trap area to increase range. Pt tolerated session well without aggravation of symptoms. Pt will benefit from continued skilled physical therapy  service to decrease stiffness, improve ROM, strength, and function.      PT Short Term Goals - 05/07/20 1009      PT SHORT TERM GOAL #1   Title Pt will be independent with her initial HEP to improve ROM and ability to raise her arm with less difficulty when appropriate.    Baseline Pt has not been able to perform much of her HEP (05/07/2020)  Time 3    Period Weeks    Status On-going    Target Date 05/31/20             PT Long Term Goals - 05/07/20 1010      PT LONG TERM GOAL #1   Title Patient will improve L shoulder PROM flexion, and abduction AROM to at least 155 degrees, and ER to 80 degrees to promote ability to reach with less difficulty when appropriate.    Baseline PROM flexion 70 degrees, abduction 58 degrees, ER 5 degrees (scapular plane) 03/27/2020.; AAROM reclined: 125 degrees flexion, 95 degrees abduction, 22 degrees ER in scapular plane (05/07/20)    Time 12    Period Weeks    Status Partially Met    Target Date 06/21/20      PT LONG TERM GOAL #2   Title Pt will have at least 145 degrees L shoulder flexion and abduction AROM, and 75 degrees ER AROM to promote ability to reach with less difficulty when appropriate.    Baseline AROM not yet performed (03/27/2020);AAROM reclined: 125 degrees flexion, 95 degrees abduction, 22 degrees ER in scapular plane (05/07/20)    Time 12    Period Weeks    Status Partially Met    Target Date 06/21/20      PT LONG TERM GOAL #3   Title PT will improve her FOTO score by at least 20 points as a demonstration of improved function.    Baseline Shoulder FOTO: 34 (03/27/2020); 50 (05/07/2020)    Time 12    Period Weeks    Status Partially Met    Target Date 06/21/20      PT LONG TERM GOAL #4   Title Pt will have at least 4+/5 MMT grade L shoulder flexion, abduction, ER and IR to promote ability to perform functional tasks.    Baseline Manual resistance not yet performed. (05/07/2020)    Time 6    Period Weeks    Status New    Target  Date 06/21/20                 Plan - 06/04/20 0901    Clinical Impression Statement Stiff end feel. Continued working on L shoulder AAROM in reclined and seated positions as well as STM to L teres major muscle and upper trap area to increase range. Pt tolerated session well without aggravation of symptoms. Pt will benefit from continued skilled physical therapy service to decrease stiffness, improve ROM, strength, and function.    Personal Factors and Comorbidities Age;Comorbidity 3+;Fitness;Time since onset of injury/illness/exacerbation    Comorbidities anxiety, depression, DM, HTN    Examination-Activity Limitations Bathing;Hygiene/Grooming;Lift;Reach Overhead;Toileting;Carry    Stability/Clinical Decision Making Stable/Uncomplicated    Clinical Decision Making Low    Rehab Potential Fair    Clinical Impairments Affecting Rehab Potential Possible difficulty adhering to precautions. Pt needs constant reminder and cueing for PROM during the first few weeks of rehab.    PT Frequency 2x / week    PT Duration 12 weeks    PT Treatment/Interventions Therapeutic activities;Therapeutic exercise;Neuromuscular re-education;Patient/family education;Manual techniques;Passive range of motion;Dry needling;Vestibular;Canalith Repostioning;Electrical Stimulation;Iontophoresis 4mg /ml Dexamethasone    PT Next Visit Plan PROM, manual techniques, modalities PRN. [facility] Reverse Total Shoulder Replacement Protocol unless otherwise noted by MD.    [place] Access Code [facility]    Consulted and Agree with Plan of Care Patient           Patient will  benefit from skilled therapeutic intervention in order to improve the following deficits and impairments:  Pain,Postural dysfunction,Improper body mechanics,Decreased strength,Decreased range of motion  Visit Diagnosis: Stiffness of left shoulder, not elsewhere classified  Chronic left shoulder  pain  Muscle weakness (generalized)     Problem List Patient Active Problem List   Diagnosis Date Noted  . 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 edema associated with type 2 diabetes mellitus ([place]) 01/03/2019  . Chronic cough 11/19/2016  . Morbid obesity ([place]) 09/26/2016  . Left ovarian cyst 05/30/2016  . S/P lumbar spinal fusion 12/13/2015  . Aortic calcification ([place]) 07/18/2015  . Controlled type 2 diabetes mellitus without complication ([place]) 19/41/7408  . Thrombocytopenia ([place]) 05/16/2015  . Recurrent major depressive disorder, in full remission ([place]) 05/16/2015  . Essential (primary) hypertension 05/16/2015  . Fatty infiltration of liver 03/15/2015  . Aortic valve stenosis, nonrheumatic 01/18/2015  . Sciatica of right side 01/09/2015  . Type 2 diabetes mellitus ([place]) 11/10/2014  . RLS (restless legs syndrome) 08/23/2014  . Neuritis or radiculitis due to rupture of lumbar intervertebral disc 06/13/2014  . Degeneration of intervertebral disc of lumbar region 06/13/2014  . Lumbar radiculitis 06/13/2014  . Arthritis 01/23/2014  . Arthritis of knee, degenerative 11/15/2013  . Depression 09/29/2013  . Insomnia 09/29/2013  . Apnea, sleep 08/29/2013  . History of nephrolithiasis 08/29/2013  . Abnormal presence of protein in urine 08/29/2013  . Headache, migraine 08/29/2013  . BP (high blood pressure) 08/29/2013  . HLD (hyperlipidemia) 08/29/2013  . Allergic rhinitis 08/29/2013  . Intractable migraine without aura 02/18/2013    Joneen Boers PT, DPT   06/04/2020, 10:01 AM  [place] [facility] [address] [place], [address] Phone: [phone]   Fax:  [phone]  Name: Jessica Cole MRN: [id number] Date of Birth: [birth year]

## 2020-06-04 NOTE — Progress Notes (Signed)
kub

## 2020-06-05 ENCOUNTER — Encounter: Payer: Self-pay | Admitting: Physician Assistant

## 2020-06-05 ENCOUNTER — Ambulatory Visit
Admission: RE | Admit: 2020-06-05 | Discharge: 2020-06-05 | Disposition: A | Discharge status: Home / Self Care | Payer: Medicare HMO | Attending: Urology | Admitting: Urology

## 2020-06-05 ENCOUNTER — Ambulatory Visit
Admission: RE | Admit: 2020-06-05 | Discharge: 2020-06-05 | Disposition: A | Discharge status: Home / Self Care | Payer: Medicare HMO | Source: Ambulatory Visit | Attending: Urology | Admitting: Urology

## 2020-06-05 ENCOUNTER — Ambulatory Visit (INDEPENDENT_AMBULATORY_CARE_PROVIDER_SITE_OTHER): Payer: Medicare HMO | Admitting: Physician Assistant

## 2020-06-05 ENCOUNTER — Ambulatory Visit
Admission: RE | Admit: 2020-06-05 | Discharge: 2020-06-05 | Disposition: A | Discharge status: Home / Self Care | Payer: Medicare HMO | Source: Ambulatory Visit | Attending: Physician Assistant | Admitting: Physician Assistant

## 2020-06-05 VITALS — BP 169/96 | HR 75 | Temp 97.9°F | Ht 59.0 in | Wt 196.0 lb

## 2020-06-05 DIAGNOSIS — N200 Calculus of kidney: Secondary | ICD-10-CM

## 2020-06-05 LAB — URINALYSIS, COMPLETE
Bilirubin, UA: NEGATIVE
Glucose, UA: NEGATIVE
Ketones, UA: NEGATIVE
Leukocytes,UA: NEGATIVE
Nitrite, UA: NEGATIVE
Specific Gravity, UA: >1.03 — ABNORMAL HIGH (ref 1.005–1.030)
Urobilinogen, Ur: 0.2 mg/dL (ref 0.2–1.0)
pH, UA: 5 (ref 5.0–7.5)

## 2020-06-05 LAB — MICROSCOPIC EXAMINATION: RBC, Urine: >30 /hpf — AB (ref 0–2)

## 2020-06-05 MED ORDER — TAMSULOSIN HCL 0.4 MG PO CAPS: 0.4000 mg | ORAL_CAPSULE | Freq: Every day | ORAL | 0 refills | Status: DC

## 2020-06-05 MED ORDER — OXYCODONE-ACETAMINOPHEN 5-325 MG PO TABS: 1.0000 | ORAL_TABLET | Freq: Four times a day (QID) | ORAL | 0 refills | Status: AC | PRN

## 2020-06-05 NOTE — H&P (View-Only) (Signed)
 06/05/2020 9:35 AM   Jessica Cole 09/13/1950 [id number]  CC: Chief Complaint  Patient presents with  . Nephrolithiasis    HPI: Jessica Cole is a 70 y.o. female with PMH DM2, OAB on oxybutynin 5 mg twice daily, and nephrolithiasis s/p ESWL of a distal left ureteral stone in 2019 who presents today for evaluation of possible acute stone episode.  Today she reports a "few weeks" of bilateral flank and abdominal pain, R>L.  The pain waxes and wanes and is typically 4-5/10, but 9/10 at its worst in severity.  Pain is alleviated by lying on a heating pad and worse with getting out of bed in the morning.  She has been taking oxycodone 2.5 mg at home for pain control with success.  She took half a dose of tramadol last night.  She denies nausea, vomiting, and fever.  KUB today with interval migration of her 2 x 5 mm upper pole left renal stone to the region of the left renal pelvis.  No right-sided calculi seen.  She underwent stat CT stone study today which revealed a 5 x 4 x 8 mm left renal pelvic stone without hydronephrosis.  She takes a baby aspirin 3 days a week (Monday, Wednesday, Saturday), most recently yesterday.  No other anticoagulation.  Per CT review, stone measures <1000HU in density with an approximate 13 cm in skin to stone distance.  In-office UA today positive for 3+ blood and 2+ protein; urine microscopy with 6-10 WBCs/HPF, >30 RBCs/HPF, and calcium oxalate crystals.   PMH: Past Medical History:  Diagnosis Date  . Anxiety   . Asthma    seasonal with allergies  . Colon polyps   . Degenerative arthritis   . Depression   . Diabetes mellitus without complication (HCC)    Type II  . Environmental allergies   . Family history of adverse reaction to anesthesia    sister- PONV  . Fatty liver   . Glaucoma   . Headache(784.0)    HX  MIGRAINES  . History of bronchitis   . History of kidney stones   . History of pneumonia   . Hypertension   . Hypothyroidism   .  Obesity   . OSA on CPAP   . Plantar fasciitis    right  . PONV (postoperative nausea and vomiting)   . Renal calculi   . Renal calculi   . RLS (restless legs syndrome) 08/23/2014    Surgical History: Past Surgical History:  Procedure Laterality Date  . ABDOMINAL HYSTERECTOMY     partial  . APPENDECTOMY    . Arthroscopic surgery, knee Left   . BREAST BIOPSY Right    several  . BREAST BIOPSY Right 03/11/2017   u/s bx neg  . BUNIONECTOMY     LEFT   . COLONOSCOPY W/ POLYPECTOMY    . COLONOSCOPY WITH PROPOFOL N/A 08/14/2017   Procedure: COLONOSCOPY WITH PROPOFOL;  Surgeon: Skulskie, Martin U, MD;  Location: [facility];  Service: Endoscopy;  Laterality: N/A;  . EXTRACORPOREAL SHOCK WAVE LITHOTRIPSY Left 05/14/2017   Procedure: EXTRACORPOREAL SHOCK WAVE LITHOTRIPSY (ESWL);  Surgeon: Stoioff, Scott C, MD;  Location: [facility] ORS;  Service: Urology;  Laterality: Left;  . FOOT SURGERY     RIGHT    . KNEE ARTHROSCOPY W/ MENISCAL REPAIR Left   . LASIK    . MAXIMUM ACCESS (MAS)POSTERIOR LUMBAR INTERBODY FUSION (PLIF) 1 LEVEL N/A 04/25/2016   Procedure: LUMBAR FOUR-FIVE  MAXIMUM ACCESS (MAS) POSTERIOR LUMBAR INTERBODY FUSION (  PLIF) with extension of instrumentation LUMBAR TWO-FIVE;  Surgeon: Tia Alert, MD;  Location: [facility] OR;  Service: Neurosurgery;  Laterality: N/A;  . MAXIMUM ACCESS (MAS)POSTERIOR LUMBAR INTERBODY FUSION (PLIF) 2 LEVEL N/A 12/13/2015   Procedure: Lumbar two-three - Lumbar three-four MAXIMUM ACCESS (MAS) POSTERIOR LUMBAR INTERBODY FUSION (PLIF)  ;  Surgeon: Tia Alert, MD;  Location: [facility] NEURO ORS;  Service: Neurosurgery;  Laterality: N/A;  . REVERSE SHOULDER ARTHROPLASTY Left 03/01/2020   Procedure: REVERSE SHOULDER ARTHROPLASTY;  Surgeon: Christena Flake, MD;  Location: [facility] ORS;  Service: Orthopedics;  Laterality: Left;  . SHOULDER SURGERY     RIGHT   . TONSILLECTOMY      Home Medications:  Allergies as of 06/05/2020      Reactions   Diazepam Other (See Comments)    hallucinations   Dexamethasone Other (See Comments)   During a tapered dose, once.  Was shaky (side effect, not allergy).      Medication List       Accurate as of June 05, 2020  9:35 AM. If you have any questions, ask your nurse or doctor.        Aimovig 140 MG/ML Soaj Generic drug: Erenumab-aooe Inject into the skin.   albuterol 108 (90 Base) MCG/ACT inhaler Commonly known as: VENTOLIN HFA Inhale into the lungs.   ASPIRIN 81 PO Take by mouth in the morning, at noon, and at bedtime.   azelastine 0.1 % nasal spray Commonly known as: ASTELIN Place 1 spray into both nostrils at bedtime as needed.   BENZONATATE PO Take by mouth.   Breo Ellipta 200-25 MCG/INH Aepb Generic drug: fluticasone furoate-vilanterol Take 1 puff by mouth daily.   DULoxetine 60 MG capsule Commonly known as: CYMBALTA Take 30 mg by mouth daily.   fluticasone 50 MCG/ACT nasal spray Commonly known as: FLONASE Place 2 sprays into both nostrils 2 (two) times daily.   gabapentin 800 MG tablet Commonly known as: NEURONTIN TAKE 1 TABLET BY MOUTH TWICE A DAY   guaiFENesin-codeine 100-10 MG/5ML syrup Commonly known as: ROBITUSSIN AC Take 5 mLs by mouth 3 (three) times daily as needed for cough.   latanoprost 0.005 % ophthalmic solution Commonly known as: XALATAN Place 1 drop into both eyes at bedtime.   levothyroxine 112 MCG tablet Commonly known as: SYNTHROID Take 112 mcg by mouth daily before breakfast.   meloxicam 7.5 MG tablet Commonly known as: MOBIC Take 7.5 mg by mouth daily.   metFORMIN 500 MG tablet Commonly known as: GLUCOPHAGE Take 500 mg by mouth 2 (two) times daily with a meal.   metoprolol succinate 50 MG 24 hr tablet Commonly known as: TOPROL-XL TAKE 1 TABLET (50 MG TOTAL) BY MOUTH DAILY. TAKE WITH OR IMMEDIATELY FOLLOWING A MEAL.   montelukast 10 MG tablet Commonly known as: SINGULAIR   ONE TOUCH ULTRA TEST test strip Generic drug: glucose blood USE 2 (TWO) TIMES  DAILY USE AS INSTRUCTED.   oxybutynin 5 MG tablet Commonly known as: DITROPAN Take 5 mg by mouth 2 (two) times daily.   oxyCODONE 5 MG immediate release tablet Commonly known as: Roxicodone Take 1-2 tablets (5-10 mg total) by mouth every 4 (four) hours as needed for moderate pain or severe pain.   pravastatin 10 MG tablet Commonly known as: PRAVACHOL Take 10 mg by mouth daily.   rOPINIRole 4 MG tablet Commonly known as: REQUIP Take 1 tablet (4 mg total) by mouth 2 (two) times daily.   telmisartan 40 MG tablet Commonly known  as: MICARDIS Take 40 mg by mouth daily.   UNABLE TO FIND 3 tablets. Med Name: Hair la Vie. Takes 2 in the morning and 1 at night       Allergies:  Allergies  Allergen Reactions  . Diazepam Other (See Comments)    hallucinations  . Dexamethasone Other (See Comments)    During a tapered dose, once.  Was shaky (side effect, not allergy).    Family History: Family History  Problem Relation Age of Onset  . Lung cancer Mother   . Congestive Heart Failure Father   . Depression Sister   . Rheum arthritis Sister   . Headache Maternal Grandfather   . Migraines Maternal Grandfather   . Headache Maternal Uncle   . Diabetes Other   . Heart disease Other   . Hypertension Other   . Breast cancer Neg Hx   . Bladder Cancer Neg Hx   . Kidney cancer Neg Hx     Social History:   reports that she has never smoked. She has never used smokeless tobacco. She reports that she does not drink alcohol and does not use drugs.  Physical Exam: BP (!) 169/96   Pulse 75   Temp 97.9 F (36.6 C) (Oral)   Ht 4\' 11"  (1.499 m)   Wt 196 lb (88.9 kg)   BMI 39.59 kg/m   Constitutional:  Alert and oriented, no acute distress, nontoxic appearing HEENT: [place], AT Cardiovascular: No clubbing, cyanosis, or edema Respiratory: Normal respiratory effort, no increased work of breathing Skin: No rashes, bruises or suspicious lesions Neurologic: Grossly intact, no focal deficits,  moving all 4 extremities Psychiatric: Normal mood and affect  Laboratory Data: Results for orders placed or performed in visit on 06/05/20  Microscopic Examination   Urine  Result Value Ref Range   WBC, UA 6-10 (A) 0 - 5 /hpf   RBC >30 (A) 0 - 2 /hpf   Epithelial Cells (non renal) 0-10 0 - 10 /hpf   Crystals Present (A) N/A   Crystal Type Calcium Oxalate N/A   Bacteria, UA Few None seen/Few  Urinalysis, Complete  Result Value Ref Range   Specific Gravity, UA >1.030 (H) 1.005 - 1.030   pH, UA 5.0 5.0 - 7.5   Color, UA Orange Yellow   Appearance Ur Cloudy (A) Clear   Leukocytes,UA Negative Negative   Protein,UA 2+ (A) Negative/Trace   Glucose, UA Negative Negative   Ketones, UA Negative Negative   RBC, UA 3+ (A) Negative   Bilirubin, UA Negative Negative   Urobilinogen, Ur 0.2 0.2 - 1.0 mg/dL   Nitrite, UA Negative Negative   Microscopic Examination See below:    Pertinent Imaging: KUB, 06/05/2020: CLINICAL DATA:  Right flank pain. Hematuria. History of nephrolithiasis and lithotripsy.  EXAM: ABDOMEN - 1 VIEW  COMPARISON:  01/26/2020 abdominal radiograph  FINDINGS: A 3 mm calcification overlies the region of the left renal sinus. No additional radiopaque stones overlying the kidneys. Numerous small calcified venous phleboliths in the deep pelvis bilaterally, with no radiopaque ureteral or bladder stones appreciated. No dilated small bowel loops. Large colonic stool volume. No evidence of pneumatosis or pneumoperitoneum. Bilateral posterior spinal fusion hardware throughout the mid to lower lumbar spine.  IMPRESSION: A 3 mm stone overlies the location of the left renal pelvis. No additional radiopaque urolithiasis appreciated.  Large colonic stool volume, suggesting constipation.   Electronically Signed   By: Delbert Phenix M.D.   On: 06/05/2020 14:49  STAT CT stone study,  06/05/2020: CLINICAL DATA:  Right flank pain for 2 weeks. Gross hematuria. Kidney  stones suspected.  EXAM: CT ABDOMEN AND PELVIS WITHOUT CONTRAST  TECHNIQUE: Multidetector CT imaging of the abdomen and pelvis was performed following the standard protocol without IV contrast.  COMPARISON:  06/05/2017  FINDINGS: Lower chest: Unremarkable.  Hepatobiliary: Subtle nodularity of liver contour raises the question of cirrhosis. There is no evidence for gallstones, gallbladder wall thickening, or pericholecystic fluid. No intrahepatic or extrahepatic biliary dilation.  Pancreas: No focal mass lesion. No dilatation of the main duct. No intraparenchymal cyst. No peripancreatic edema.  Spleen: No splenomegaly. No focal mass lesion.  Adrenals/Urinary Tract: No adrenal nodule or mass.  1 mm stone identified in the lower pole right kidney (well seen coronal 88/4). No right ureteral stones. No secondary changes in the right kidney or ureter.  5 x 4 x 8 mm stone is identified in the left renal pelvis without hydronephrosis. No other left renal stone disease. No left ureteral stones. No secondary changes in the left kidney or ureter.  No bladder stones.  Stomach/Bowel: Stomach is unremarkable. No gastric wall thickening. No evidence of outlet obstruction. Duodenum is normally positioned as is the ligament of Treitz. No small bowel wall thickening. No small bowel dilatation. The terminal ileum is normal. The appendix is not visualized, but there is no edema or inflammation in the region of the cecum. No gross colonic mass. No colonic wall thickening. Diverticular changes are noted in the left colon without evidence of diverticulitis.  Vascular/Lymphatic: There is abdominal aortic atherosclerosis without aneurysm. There is no gastrohepatic or hepatoduodenal ligament lymphadenopathy. No retroperitoneal or mesenteric lymphadenopathy. No pelvic sidewall lymphadenopathy.  Reproductive: The uterus is surgically absent. 2.6 cm cystic lesion in the left ovary  has decreased from 3.3 cm on the previous study consistent with benign etiology. No right adnexal mass.  Other: No intraperitoneal free fluid.  Musculoskeletal: No worrisome lytic or sclerotic osseous abnormality. Lumbar fusion hardware evident.  IMPRESSION: 1. 5 x 4 x 8 mm stone in the left renal pelvis without hydronephrosis. No secondary changes in the left kidney or ureter. 2. 1 mm nonobstructing stone lower pole right kidney. No secondary changes in the right kidney or ureter. 3. Subtle nodularity of liver contour raises the question of cirrhosis. 4. Left colonic diverticulosis without diverticulitis. 5. Aortic Atherosclerosis (ICD10-I70.0).   Electronically Signed   By: Kennith Center M.D.   On: 06/05/2020 11:01  I personally reviewed the images referenced above and note interval migration of the left upper pole stone to the left renal pelvis, not currently causing obstruction.  Assessment & Plan:   1. Left renal stone Several weeks of bilateral flank and abdominal pain, R>L with imaging findings of interval migration of her left upper pole renal stone to the renal pelvis, likely ball valving and causing intermittent urinary obstruction.  UA today notable for microscopic hematuria and mild pyuria consistent with acute stone episode.  Low suspicion for infection and patient is afebrile with stable vitals in clinic today.  Will order urine culture in case she develops infective signs.  I explained that her stone is located on the left side, and so it is not likely to be the source of her right-sided pain.  I explained she may likely be having concurrent MSK back pain as well as a stone episode.  We discussed treatment options today for her left-sided stone including trial of passage, ESWL, and ureteroscopy.  Patient wishes to proceed with ESWL at  this time and I am in agreement with this plan.  Prescribing Flomax and Percocet today with plans for left ESWL in 2 days, schedule  permitting.  Last aspirin 1 day ago, I counseled her to hold her next dose, scheduled for tomorrow. - Urinalysis, Complete - CULTURE, URINE COMPREHENSIVE - CT RENAL STONE STUDY; Future - tamsulosin (FLOMAX) 0.4 MG CAPS capsule; Take 1 capsule (0.4 mg total) by mouth daily.  Dispense: 30 capsule; Refill: 0 - oxyCODONE-acetaminophen (PERCOCET/ROXICET) 5-325 MG tablet; Take 1-2 tablets by mouth every 6 (six) hours as needed for up to 5 days for severe pain.  Dispense: 10 tablet; Refill: 0   Return for Surgical scheduler to call to schedule ESWL.   I spent 60 minutes on the day of the encounter to include pre-visit record review, face-to-face time with the patient, and post-visit ordering of tests.   Carman Ching, PA-C  [facility] [address] [place], [place] [address] [phone]

## 2020-06-05 NOTE — Patient Instructions (Signed)
1. DO NOT TAKE Aspirin tomorrow. 2. Our surgical scheduler, Amy, will call you to discuss shockwave lithotripsy, ideally this Thursday. 3. Start Flomax daily. 4. Take Percocet as needed for left-sided pain.

## 2020-06-05 NOTE — Progress Notes (Signed)
06/05/2020 9:35 AM   Jessica Cole 1950/05/08 [id number]  CC: Chief Complaint  Patient presents with  . Nephrolithiasis    HPI: Jessica Cole is a 70 y.o. female with PMH DM2, OAB on oxybutynin 5 mg twice daily, and nephrolithiasis s/p ESWL of a distal left ureteral stone in 2019 who presents today for evaluation of possible acute stone episode.  Today she reports a "few weeks" of bilateral flank and abdominal pain, R>L.  The pain waxes and wanes and is typically 4-5/10, but 9/10 at its worst in severity.  Pain is alleviated by lying on a heating pad and worse with getting out of bed in the morning.  She has been taking oxycodone 2.5 mg at home for pain control with success.  She took half a dose of tramadol last night.  She denies nausea, vomiting, and fever.  KUB today with interval migration of her 2 x 5 mm upper pole left renal stone to the region of the left renal pelvis.  No right-sided calculi seen.  She underwent stat CT stone study today which revealed a 5 x 4 x 8 mm left renal pelvic stone without hydronephrosis.  She takes a baby aspirin 3 days a week (Monday, Wednesday, Saturday), most recently yesterday.  No other anticoagulation.  Per CT review, stone measures <1000HU in density with an approximate 13 cm in skin to stone distance.  In-office UA today positive for 3+ blood and 2+ protein; urine microscopy with 6-10 WBCs/HPF, >30 RBCs/HPF, and calcium oxalate crystals.   PMH: Past Medical History:  Diagnosis Date  . Anxiety   . Asthma    seasonal with allergies  . Colon polyps   . Degenerative arthritis   . Depression   . Diabetes mellitus without complication (HCC)    Type II  . Environmental allergies   . Family history of adverse reaction to anesthesia    sister- PONV  . Fatty liver   . Glaucoma   . Headache(784.0)    HX  MIGRAINES  . History of bronchitis   . History of kidney stones   . History of pneumonia   . Hypertension   . Hypothyroidism   .  Obesity   . OSA on CPAP   . Plantar fasciitis    right  . PONV (postoperative nausea and vomiting)   . Renal calculi   . Renal calculi   . RLS (restless legs syndrome) 08/23/2014    Surgical History: Past Surgical History:  Procedure Laterality Date  . ABDOMINAL HYSTERECTOMY     partial  . APPENDECTOMY    . Arthroscopic surgery, knee Left   . BREAST BIOPSY Right    several  . BREAST BIOPSY Right 03/11/2017   u/s bx neg  . BUNIONECTOMY     LEFT   . COLONOSCOPY W/ POLYPECTOMY    . COLONOSCOPY WITH PROPOFOL N/A 08/14/2017   Procedure: COLONOSCOPY WITH PROPOFOL;  Surgeon: Christena DeemSkulskie, Martin U, MD;  [facility];  Service: Endoscopy;  Laterality: N/A;  . EXTRACORPOREAL SHOCK WAVE LITHOTRIPSY Left 05/14/2017   Procedure: EXTRACORPOREAL SHOCK WAVE LITHOTRIPSY (ESWL);  Surgeon: Riki AltesStoioff, Scott C, MD;  Location: [facility] ORS;  Service: Urology;  Laterality: Left;  . FOOT SURGERY     RIGHT    . KNEE ARTHROSCOPY W/ MENISCAL REPAIR Left   . LASIK    . MAXIMUM ACCESS (MAS)POSTERIOR LUMBAR INTERBODY FUSION (PLIF) 1 LEVEL N/A 04/25/2016   Procedure: LUMBAR FOUR-FIVE  MAXIMUM ACCESS (MAS) POSTERIOR LUMBAR INTERBODY FUSION (  PLIF) with extension of instrumentation LUMBAR TWO-FIVE;  Surgeon: Tia Alert, MD;  Location: [facility] OR;  Service: Neurosurgery;  Laterality: N/A;  . MAXIMUM ACCESS (MAS)POSTERIOR LUMBAR INTERBODY FUSION (PLIF) 2 LEVEL N/A 12/13/2015   Procedure: Lumbar two-three - Lumbar three-four MAXIMUM ACCESS (MAS) POSTERIOR LUMBAR INTERBODY FUSION (PLIF)  ;  Surgeon: Tia Alert, MD;  Location: [facility] NEURO ORS;  Service: Neurosurgery;  Laterality: N/A;  . REVERSE SHOULDER ARTHROPLASTY Left 03/01/2020   Procedure: REVERSE SHOULDER ARTHROPLASTY;  Surgeon: Christena Flake, MD;  Location: [facility] ORS;  Service: Orthopedics;  Laterality: Left;  . SHOULDER SURGERY     RIGHT   . TONSILLECTOMY      Home Medications:  Allergies as of 06/05/2020      Reactions   Diazepam Other (See Comments)    hallucinations   Dexamethasone Other (See Comments)   During a tapered dose, once.  Was shaky (side effect, not allergy).      Medication List       Accurate as of June 05, 2020  9:35 AM. If you have any questions, ask your nurse or doctor.        Aimovig 140 MG/ML Soaj Generic drug: Erenumab-aooe Inject into the skin.   albuterol 108 (90 Base) MCG/ACT inhaler Commonly known as: VENTOLIN HFA Inhale into the lungs.   ASPIRIN 81 PO Take by mouth in the morning, at noon, and at bedtime.   azelastine 0.1 % nasal spray Commonly known as: ASTELIN Place 1 spray into both nostrils at bedtime as needed.   BENZONATATE PO Take by mouth.   Breo Ellipta 200-25 MCG/INH Aepb Generic drug: fluticasone furoate-vilanterol Take 1 puff by mouth daily.   DULoxetine 60 MG capsule Commonly known as: CYMBALTA Take 30 mg by mouth daily.   fluticasone 50 MCG/ACT nasal spray Commonly known as: FLONASE Place 2 sprays into both nostrils 2 (two) times daily.   gabapentin 800 MG tablet Commonly known as: NEURONTIN TAKE 1 TABLET BY MOUTH TWICE A DAY   guaiFENesin-codeine 100-10 MG/5ML syrup Commonly known as: ROBITUSSIN AC Take 5 mLs by mouth 3 (three) times daily as needed for cough.   latanoprost 0.005 % ophthalmic solution Commonly known as: XALATAN Place 1 drop into both eyes at bedtime.   levothyroxine 112 MCG tablet Commonly known as: SYNTHROID Take 112 mcg by mouth daily before breakfast.   meloxicam 7.5 MG tablet Commonly known as: MOBIC Take 7.5 mg by mouth daily.   metFORMIN 500 MG tablet Commonly known as: GLUCOPHAGE Take 500 mg by mouth 2 (two) times daily with a meal.   metoprolol succinate 50 MG 24 hr tablet Commonly known as: TOPROL-XL TAKE 1 TABLET (50 MG TOTAL) BY MOUTH DAILY. TAKE WITH OR IMMEDIATELY FOLLOWING A MEAL.   montelukast 10 MG tablet Commonly known as: SINGULAIR   ONE TOUCH ULTRA TEST test strip Generic drug: glucose blood USE 2 (TWO) TIMES  DAILY USE AS INSTRUCTED.   oxybutynin 5 MG tablet Commonly known as: DITROPAN Take 5 mg by mouth 2 (two) times daily.   oxyCODONE 5 MG immediate release tablet Commonly known as: Roxicodone Take 1-2 tablets (5-10 mg total) by mouth every 4 (four) hours as needed for moderate pain or severe pain.   pravastatin 10 MG tablet Commonly known as: PRAVACHOL Take 10 mg by mouth daily.   rOPINIRole 4 MG tablet Commonly known as: REQUIP Take 1 tablet (4 mg total) by mouth 2 (two) times daily.   telmisartan 40 MG tablet Commonly known  as: MICARDIS Take 40 mg by mouth daily.   UNABLE TO FIND 3 tablets. Med Name: Hair la Vie. Takes 2 in the morning and 1 at night       Allergies:  Allergies  Allergen Reactions  . Diazepam Other (See Comments)    hallucinations  . Dexamethasone Other (See Comments)    During a tapered dose, once.  Was shaky (side effect, not allergy).    Family History: Family History  Problem Relation Age of Onset  . Lung cancer Mother   . Congestive Heart Failure Father   . Depression Sister   . Rheum arthritis Sister   . Headache Maternal Grandfather   . Migraines Maternal Grandfather   . Headache Maternal Uncle   . Diabetes Other   . Heart disease Other   . Hypertension Other   . Breast cancer Neg Hx   . Bladder Cancer Neg Hx   . Kidney cancer Neg Hx     Social History:   reports that she has never smoked. She has never used smokeless tobacco. She reports that she does not drink alcohol and does not use drugs.  Physical Exam: BP (!) 169/96   Pulse 75   Temp 97.9 F (36.6 C) (Oral)   Ht 4\' 11"  (1.499 m)   Wt 196 lb (88.9 kg)   BMI 39.59 kg/m   Constitutional:  Alert and oriented, no acute distress, nontoxic appearing HEENT: [place], AT Cardiovascular: No clubbing, cyanosis, or edema Respiratory: Normal respiratory effort, no increased work of breathing Skin: No rashes, bruises or suspicious lesions Neurologic: Grossly intact, no focal deficits,  moving all 4 extremities Psychiatric: Normal mood and affect  Laboratory Data: Results for orders placed or performed in visit on 06/05/20  Microscopic Examination   Urine  Result Value Ref Range   WBC, UA 6-10 (A) 0 - 5 /hpf   RBC >30 (A) 0 - 2 /hpf   Epithelial Cells (non renal) 0-10 0 - 10 /hpf   Crystals Present (A) N/A   Crystal Type Calcium Oxalate N/A   Bacteria, UA Few None seen/Few  Urinalysis, Complete  Result Value Ref Range   Specific Gravity, UA >1.030 (H) 1.005 - 1.030   pH, UA 5.0 5.0 - 7.5   Color, UA Orange Yellow   Appearance Ur Cloudy (A) Clear   Leukocytes,UA Negative Negative   Protein,UA 2+ (A) Negative/Trace   Glucose, UA Negative Negative   Ketones, UA Negative Negative   RBC, UA 3+ (A) Negative   Bilirubin, UA Negative Negative   Urobilinogen, Ur 0.2 0.2 - 1.0 mg/dL   Nitrite, UA Negative Negative   Microscopic Examination See below:    Pertinent Imaging: KUB, 06/05/2020: CLINICAL DATA:  Right flank pain. Hematuria. History of nephrolithiasis and lithotripsy.  EXAM: ABDOMEN - 1 VIEW  COMPARISON:  01/26/2020 abdominal radiograph  FINDINGS: A 3 mm calcification overlies the region of the left renal sinus. No additional radiopaque stones overlying the kidneys. Numerous small calcified venous phleboliths in the deep pelvis bilaterally, with no radiopaque ureteral or bladder stones appreciated. No dilated small bowel loops. Large colonic stool volume. No evidence of pneumatosis or pneumoperitoneum. Bilateral posterior spinal fusion hardware throughout the mid to lower lumbar spine.  IMPRESSION: A 3 mm stone overlies the location of the left renal pelvis. No additional radiopaque urolithiasis appreciated.  Large colonic stool volume, suggesting constipation.   Electronically Signed   By: Delbert Phenix M.D.   On: 06/05/2020 14:49  STAT CT stone study,  06/05/2020: CLINICAL DATA:  Right flank pain for 2 weeks. Gross hematuria. Kidney  stones suspected.  EXAM: CT ABDOMEN AND PELVIS WITHOUT CONTRAST  TECHNIQUE: Multidetector CT imaging of the abdomen and pelvis was performed following the standard protocol without IV contrast.  COMPARISON:  06/05/2017  FINDINGS: Lower chest: Unremarkable.  Hepatobiliary: Subtle nodularity of liver contour raises the question of cirrhosis. There is no evidence for gallstones, gallbladder wall thickening, or pericholecystic fluid. No intrahepatic or extrahepatic biliary dilation.  Pancreas: No focal mass lesion. No dilatation of the main duct. No intraparenchymal cyst. No peripancreatic edema.  Spleen: No splenomegaly. No focal mass lesion.  Adrenals/Urinary Tract: No adrenal nodule or mass.  1 mm stone identified in the lower pole right kidney (well seen coronal 88/4). No right ureteral stones. No secondary changes in the right kidney or ureter.  5 x 4 x 8 mm stone is identified in the left renal pelvis without hydronephrosis. No other left renal stone disease. No left ureteral stones. No secondary changes in the left kidney or ureter.  No bladder stones.  Stomach/Bowel: Stomach is unremarkable. No gastric wall thickening. No evidence of outlet obstruction. Duodenum is normally positioned as is the ligament of Treitz. No small bowel wall thickening. No small bowel dilatation. The terminal ileum is normal. The appendix is not visualized, but there is no edema or inflammation in the region of the cecum. No gross colonic mass. No colonic wall thickening. Diverticular changes are noted in the left colon without evidence of diverticulitis.  Vascular/Lymphatic: There is abdominal aortic atherosclerosis without aneurysm. There is no gastrohepatic or hepatoduodenal ligament lymphadenopathy. No retroperitoneal or mesenteric lymphadenopathy. No pelvic sidewall lymphadenopathy.  Reproductive: The uterus is surgically absent. 2.6 cm cystic lesion in the left ovary  has decreased from 3.3 cm on the previous study consistent with benign etiology. No right adnexal mass.  Other: No intraperitoneal free fluid.  Musculoskeletal: No worrisome lytic or sclerotic osseous abnormality. Lumbar fusion hardware evident.  IMPRESSION: 1. 5 x 4 x 8 mm stone in the left renal pelvis without hydronephrosis. No secondary changes in the left kidney or ureter. 2. 1 mm nonobstructing stone lower pole right kidney. No secondary changes in the right kidney or ureter. 3. Subtle nodularity of liver contour raises the question of cirrhosis. 4. Left colonic diverticulosis without diverticulitis. 5. Aortic Atherosclerosis (ICD10-I70.0).   Electronically Signed   By: Kennith Center M.D.   On: 06/05/2020 11:01  I personally reviewed the images referenced above and note interval migration of the left upper pole stone to the left renal pelvis, not currently causing obstruction.  Assessment & Plan:   1. Left renal stone Several weeks of bilateral flank and abdominal pain, R>L with imaging findings of interval migration of her left upper pole renal stone to the renal pelvis, likely ball valving and causing intermittent urinary obstruction.  UA today notable for microscopic hematuria and mild pyuria consistent with acute stone episode.  Low suspicion for infection and patient is afebrile with stable vitals in clinic today.  Will order urine culture in case she develops infective signs.  I explained that her stone is located on the left side, and so it is not likely to be the source of her right-sided pain.  I explained she may likely be having concurrent MSK back pain as well as a stone episode.  We discussed treatment options today for her left-sided stone including trial of passage, ESWL, and ureteroscopy.  Patient wishes to proceed with ESWL at  this time and I am in agreement with this plan.  Prescribing Flomax and Percocet today with plans for left ESWL in 2 days, schedule  permitting.  Last aspirin 1 day ago, I counseled her to hold her next dose, scheduled for tomorrow. - Urinalysis, Complete - CULTURE, URINE COMPREHENSIVE - CT RENAL STONE STUDY; Future - tamsulosin (FLOMAX) 0.4 MG CAPS capsule; Take 1 capsule (0.4 mg total) by mouth daily.  Dispense: 30 capsule; Refill: 0 - oxyCODONE-acetaminophen (PERCOCET/ROXICET) 5-325 MG tablet; Take 1-2 tablets by mouth every 6 (six) hours as needed for up to 5 days for severe pain.  Dispense: 10 tablet; Refill: 0   Return for Surgical scheduler to call to schedule ESWL.   I spent 60 minutes on the day of the encounter to include pre-visit record review, face-to-face time with the patient, and post-visit ordering of tests.   Carman Ching, PA-C  [facility] [address] [place], [place] [address] [phone]

## 2020-06-07 ENCOUNTER — Other Ambulatory Visit: Payer: Self-pay

## 2020-06-07 ENCOUNTER — Ambulatory Visit: Payer: Medicare HMO | Attending: Family Medicine

## 2020-06-07 ENCOUNTER — Other Ambulatory Visit: Payer: Self-pay | Admitting: Radiology

## 2020-06-07 ENCOUNTER — Other Ambulatory Visit: Payer: Self-pay | Admitting: Neurology

## 2020-06-07 DIAGNOSIS — M25612 Stiffness of left shoulder, not elsewhere classified: Secondary | ICD-10-CM | POA: Diagnosis present

## 2020-06-07 DIAGNOSIS — G8929 Other chronic pain: Secondary | ICD-10-CM | POA: Insufficient documentation

## 2020-06-07 DIAGNOSIS — M6281 Muscle weakness (generalized): Secondary | ICD-10-CM | POA: Diagnosis present

## 2020-06-07 DIAGNOSIS — M25512 Pain in left shoulder: Secondary | ICD-10-CM | POA: Diagnosis present

## 2020-06-07 DIAGNOSIS — N200 Calculus of kidney: Secondary | ICD-10-CM

## 2020-06-07 NOTE — Therapy (Signed)
[facility] [address] [place], [address] Phone: [phone]   Fax:  [phone]  Physical Therapy Treatment  Patient Details  Name: Jessica Cole MRN: [id number] Date of Birth: 06-16-1950 Referring Provider (PT): Lattie Corns, [place]   Encounter Date: 06/07/2020   PT End of Session - 06/07/20 1432    Visit Number 15    Number of Visits 25    Date for PT Re-Evaluation 06/21/20    Authorization Type 5    Authorization Time Period of 10 progress report    PT Start Time 1433    PT Stop Time 1512    PT Time Calculation (min) 39 min    Activity Tolerance Patient tolerated treatment well    Behavior During Therapy [facility] for tasks assessed/performed           Past Medical History:  Diagnosis Date  . Anxiety   . Asthma    seasonal with allergies  . Colon polyps   . Degenerative arthritis   . Depression   . Diabetes mellitus without complication ([place])    Type II  . Environmental allergies   . Family history of adverse reaction to anesthesia    sister- PONV  . Fatty liver   . Glaucoma   . Headache(784.0)    HX  MIGRAINES  . History of bronchitis   . History of kidney stones   . History of pneumonia   . Hypertension   . Hypothyroidism   . Obesity   . OSA on CPAP   . Plantar fasciitis    right  . PONV (postoperative nausea and vomiting)   . Renal calculi   . Renal calculi   . RLS (restless legs syndrome) 08/23/2014    Past Surgical History:  Procedure Laterality Date  . ABDOMINAL HYSTERECTOMY     partial  . APPENDECTOMY    . Arthroscopic surgery, knee Left   . BREAST BIOPSY Right    several  . BREAST BIOPSY Right 03/11/2017   u/s bx neg  . BUNIONECTOMY     LEFT   . COLONOSCOPY W/ POLYPECTOMY    . COLONOSCOPY WITH PROPOFOL N/A 08/14/2017   Procedure: COLONOSCOPY WITH PROPOFOL;  Surgeon: Lollie Sails, MD;  Location: [facility] ENDOSCOPY;  Service: Endoscopy;  Laterality: N/A;  .  EXTRACORPOREAL SHOCK WAVE LITHOTRIPSY Left 05/14/2017   Procedure: EXTRACORPOREAL SHOCK WAVE LITHOTRIPSY (ESWL);  Surgeon: Abbie Sons, MD;  Location: [facility] ORS;  Service: Urology;  Laterality: Left;  . FOOT SURGERY     RIGHT    . KNEE ARTHROSCOPY W/ MENISCAL REPAIR Left   . LASIK    . MAXIMUM ACCESS (MAS)POSTERIOR LUMBAR INTERBODY FUSION (PLIF) 1 LEVEL N/A 04/25/2016   Procedure: LUMBAR FOUR-FIVE  MAXIMUM ACCESS (MAS) POSTERIOR LUMBAR INTERBODY FUSION (PLIF) with extension of instrumentation LUMBAR TWO-FIVE;  Surgeon: Eustace Moore, MD;  Location: [facility];  Service: Neurosurgery;  Laterality: N/A;  . MAXIMUM ACCESS (MAS)POSTERIOR LUMBAR INTERBODY FUSION (PLIF) 2 LEVEL N/A 12/13/2015   Procedure: Lumbar two-three - Lumbar three-four MAXIMUM ACCESS (MAS) POSTERIOR LUMBAR INTERBODY FUSION (PLIF)  ;  Surgeon: Eustace Moore, MD;  Location: [facility] NEURO ORS;  Service: Neurosurgery;  Laterality: N/A;  . REVERSE SHOULDER ARTHROPLASTY Left 03/01/2020   Procedure: REVERSE SHOULDER ARTHROPLASTY;  Surgeon: Corky Mull, MD;  Location: [facility] ORS;  Service: Orthopedics;  Laterality: Left;  . SHOULDER SURGERY     RIGHT   . TONSILLECTOMY  There were no vitals filed for this visit.   Subjective Assessment - 06/07/20 1435    Subjective Has a L kidney stone. Does not know what she did this morning. It was sore and hurt. The locking was not as bad since last session. No L shoulder pain currently (at rest)    Pertinent History S/P L reverse total shoulder replacment. Did not know about only PROM for now. Had home health PT for 2 weeks which involved PROM shoulder abduction, flexion as well as isometric shoulder flexion and abduction.  Pt sleeps on her bed.  Next surgeon appointment is either first or second week of December around Thurday of Friday.  Pt also states having ringing in her ears which started since the surgery.    Patient Stated Goals Raise her L arm better, be able to pick up things as well as work  on crafts.    Currently in Pain? Other (Comment)   No L shoulder pain, but has kidney stone related pain.   Pain Onset More than a month ago                                     PT Education - 06/07/20 1450    Education provided Yes    Education Details ther-ex    [employer]) Educated Patient    Methods Explanation;Demonstration;Tactile cues;Verbal cues    Comprehension Returned demonstration;Verbalized understanding          Objective  No latex band allergies  03/27/2020: Blood pressure R arm sitting, mechanically taken, normal cuff: 170/80, HR 59  Last home health session was about over a week ago  L shoulder incisions: healing, scab formation with slight redness around the scabs.  13weeks post op   MedbridgeAccess Code [email or web address]   Manual therapy Supine STM L teres major muscle area    Therapeutic exercise  reclinedL shoulder A/AROM  flexion 10x3 scaption 10x3 Abduction 10x3 ER 10x3  Seated manually resisted L shoulder isometrics  ER 10x3 with 5 second holds   IR 10x3 with 5 second holds   Seated B scapular retraction 10x5 seconds pain free range.   Seated L shoulder AAROM with PT              Flexion 10x             scaption 10x             Abduction 10x    Improved exercise technique, movement at target joints, use of target muscles after mod verbal, visual, tactile cues.   Response to treatment Pt tolerated session well without aggravation of symptosm.    Clinical impression  Continued working on L shoulder A/AROM reclined and upright position to decrease stiffness and improve strength to raise her arm up. Stiff end feel but range improves with repetition. Pt tolerated session well without aggravation of symptoms. Pt will benefit from skilled physical therapy services to improve ROM, strength and function.        PT Short Term Goals - 05/07/20 1009      PT SHORT TERM GOAL #1   Title Pt will be independent with her initial HEP to improve ROM and ability to raise her arm with less difficulty when appropriate.    Baseline Pt has not been able to perform much of her HEP (05/07/2020)    Time 3    Period Weeks    Status On-going  Target Date 05/31/20             PT Long Term Goals - 05/07/20 1010      PT LONG TERM GOAL #1   Title Patient will improve L shoulder PROM flexion, and abduction AROM to at least 155 degrees, and ER to 80 degrees to promote ability to reach with less difficulty when appropriate.    Baseline PROM flexion 70 degrees, abduction 58 degrees, ER 5 degrees (scapular plane) 03/27/2020.; AAROM reclined: 125 degrees flexion, 95 degrees abduction, 22 degrees ER in scapular plane (05/07/20)    Time 12    Period Weeks    Status Partially Met    Target Date 06/21/20      PT LONG TERM GOAL #2   Title Pt will have at least 145 degrees L shoulder flexion and abduction AROM, and 75 degrees ER AROM to promote ability to reach with less difficulty when appropriate.    Baseline AROM not yet performed (03/27/2020);AAROM reclined: 125 degrees flexion, 95 degrees abduction, 22 degrees ER in scapular plane (05/07/20)    Time 12    Period Weeks    Status Partially Met    Target Date 06/21/20      PT LONG TERM GOAL #3   Title PT will improve her FOTO score by at least 20 points as a demonstration of improved function.    Baseline Shoulder FOTO: 34 (03/27/2020); 50 (05/07/2020)    Time 12    Period Weeks    Status Partially Met    Target Date 06/21/20      PT LONG TERM GOAL #4   Title Pt will have at least 4+/5 MMT grade L shoulder flexion, abduction, ER and IR to promote ability to perform functional tasks.    Baseline Manual resistance not yet performed. (05/07/2020)    Time 6    Period Weeks    Status New    Target Date 06/21/20                 Plan - 06/07/20 1453    Clinical  Impression Statement Continued working on L shoulder A/AROM reclined and upright position to decrease stiffness and improve strength to raise her arm up. Stiff end feel but range improves with repetition. Pt tolerated session well without aggravation of symptoms. Pt will benefit from skilled physical therapy services to improve ROM, strength and function.    Personal Factors and Comorbidities Age;Comorbidity 3+;Fitness;Time since onset of injury/illness/exacerbation    Comorbidities anxiety, depression, DM, HTN    Examination-Activity Limitations Bathing;Hygiene/Grooming;Lift;Reach Overhead;Toileting;Carry    Stability/Clinical Decision Making Stable/Uncomplicated    Clinical Decision Making Low    Rehab Potential Fair    Clinical Impairments Affecting Rehab Potential Possible difficulty adhering to precautions. Pt needs constant reminder and cueing for PROM during the first few weeks of rehab.    PT Frequency 2x / week    PT Duration 12 weeks    PT Treatment/Interventions Therapeutic activities;Therapeutic exercise;Neuromuscular re-education;Patient/family education;Manual techniques;Passive range of motion;Dry needling;Vestibular;Canalith Repostioning;Electrical Stimulation;Iontophoresis 4mg /ml Dexamethasone    PT Next Visit Plan PROM, manual techniques, modalities PRN. [facility] Reverse Total Shoulder Replacement Protocol unless otherwise noted by MD.    [place] Access Code [facility]    Consulted and Agree with Plan of Care Patient           Patient will benefit from skilled therapeutic intervention in order to improve the following deficits and impairments:  Pain,Postural dysfunction,Improper body mechanics,Decreased  strength,Decreased range of motion  Visit Diagnosis: Stiffness of left shoulder, not elsewhere classified  Chronic left shoulder pain  Muscle weakness (generalized)     Problem List Patient Active Problem List    Diagnosis Date Noted  . 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 edema associated with type 2 diabetes mellitus ([place]) 01/03/2019  . Chronic cough 11/19/2016  . Morbid obesity ([place]) 09/26/2016  . Left ovarian cyst 05/30/2016  . S/P lumbar spinal fusion 12/13/2015  . Aortic calcification ([place]) 07/18/2015  . Controlled type 2 diabetes mellitus without complication ([place]) 81/02/3158  . Thrombocytopenia ([place]) 05/16/2015  . Recurrent major depressive disorder, in full remission ([place]) 05/16/2015  . Essential (primary) hypertension 05/16/2015  . Fatty infiltration of liver 03/15/2015  . Aortic valve stenosis, nonrheumatic 01/18/2015  . Sciatica of right side 01/09/2015  . Type 2 diabetes mellitus ([place]) 11/10/2014  . RLS (restless legs syndrome) 08/23/2014  . Neuritis or radiculitis due to rupture of lumbar intervertebral disc 06/13/2014  . Degeneration of intervertebral disc of lumbar region 06/13/2014  . Lumbar radiculitis 06/13/2014  . Arthritis 01/23/2014  . Arthritis of knee, degenerative 11/15/2013  . Depression 09/29/2013  . Insomnia 09/29/2013  . Apnea, sleep 08/29/2013  . History of nephrolithiasis 08/29/2013  . Abnormal presence of protein in urine 08/29/2013  . Headache, migraine 08/29/2013  . BP (high blood pressure) 08/29/2013  . HLD (hyperlipidemia) 08/29/2013  . Allergic rhinitis 08/29/2013  . Intractable migraine without aura 02/18/2013    Joneen Boers PT, DPT   06/07/2020, 6:32 PM  [facility] [address] [place], [address] Phone: [phone]   Fax:  [phone]  Name: LARYSA PALL MRN: [id number] Date of Birth: [birth year]

## 2020-06-09 LAB — CULTURE, URINE COMPREHENSIVE

## 2020-06-11 ENCOUNTER — Other Ambulatory Visit: Payer: Self-pay | Admitting: Radiology

## 2020-06-11 DIAGNOSIS — N200 Calculus of kidney: Secondary | ICD-10-CM

## 2020-06-12 ENCOUNTER — Ambulatory Visit: Payer: Medicare HMO

## 2020-06-12 ENCOUNTER — Other Ambulatory Visit: Payer: Medicare HMO

## 2020-06-12 ENCOUNTER — Other Ambulatory Visit: Payer: Self-pay

## 2020-06-12 DIAGNOSIS — M25612 Stiffness of left shoulder, not elsewhere classified: Secondary | ICD-10-CM | POA: Diagnosis not present

## 2020-06-12 DIAGNOSIS — M6281 Muscle weakness (generalized): Secondary | ICD-10-CM

## 2020-06-12 DIAGNOSIS — G8929 Other chronic pain: Secondary | ICD-10-CM

## 2020-06-12 DIAGNOSIS — M25512 Pain in left shoulder: Secondary | ICD-10-CM

## 2020-06-12 NOTE — Therapy (Signed)
[facility] [address], [place], [address] Phone: [phone]   Fax:  [phone]  Physical Therapy Treatment  Patient Details  Name: Jessica Cole MRN: [id number] Date of Birth: [birth year] Referring Provider (PT): Lattie Corns, [place]   Encounter Date: 06/12/2020   PT End of Session - 06/12/20 0906    Visit Number 16    Number of Visits 25    Date for PT Re-Evaluation 06/21/20    Authorization Type 6    Authorization Time Period of 10 progress report    PT Start Time 0906    PT Stop Time 0946    PT Time Calculation (min) 40 min    Activity Tolerance Patient tolerated treatment well    Behavior During Therapy [facility] for tasks assessed/performed           Past Medical History:  Diagnosis Date  . Anxiety   . Asthma    seasonal with allergies  . Colon polyps   . Degenerative arthritis   . Depression   . Diabetes mellitus without complication ([place])    Type II  . Environmental allergies   . Family history of adverse reaction to anesthesia    sister- PONV  . Fatty liver   . Glaucoma   . Headache(784.0)    HX  MIGRAINES  . History of bronchitis   . History of kidney stones   . History of pneumonia   . Hypertension   . Hypothyroidism   . Obesity   . OSA on CPAP   . Plantar fasciitis    right  . PONV (postoperative nausea and vomiting)   . Renal calculi   . Renal calculi   . RLS (restless legs syndrome) 08/23/2014    Past Surgical History:  Procedure Laterality Date  . ABDOMINAL HYSTERECTOMY     partial  . APPENDECTOMY    . Arthroscopic surgery, knee Left   . BREAST BIOPSY Right    several  . BREAST BIOPSY Right 03/11/2017   u/s bx neg  . BUNIONECTOMY     LEFT   . COLONOSCOPY W/ POLYPECTOMY    . COLONOSCOPY WITH PROPOFOL N/A 08/14/2017   Procedure: COLONOSCOPY WITH PROPOFOL;  Surgeon: Lollie Sails, MD;  Location: [facility] ENDOSCOPY;  Service: Endoscopy;  Laterality: N/A;  .  EXTRACORPOREAL SHOCK WAVE LITHOTRIPSY Left 05/14/2017   Procedure: EXTRACORPOREAL SHOCK WAVE LITHOTRIPSY (ESWL);  Surgeon: Abbie Sons, MD;  Location: [facility] ORS;  Service: Urology;  Laterality: Left;  . FOOT SURGERY     RIGHT    . KNEE ARTHROSCOPY W/ MENISCAL REPAIR Left   . LASIK    . MAXIMUM ACCESS (MAS)POSTERIOR LUMBAR INTERBODY FUSION (PLIF) 1 LEVEL N/A 04/25/2016   Procedure: LUMBAR FOUR-FIVE  MAXIMUM ACCESS (MAS) POSTERIOR LUMBAR INTERBODY FUSION (PLIF) with extension of instrumentation LUMBAR TWO-FIVE;  Surgeon: Eustace Moore, MD;  Location: [place];  Service: Neurosurgery;  Laterality: N/A;  . MAXIMUM ACCESS (MAS)POSTERIOR LUMBAR INTERBODY FUSION (PLIF) 2 LEVEL N/A 12/13/2015   Procedure: Lumbar two-three - Lumbar three-four MAXIMUM ACCESS (MAS) POSTERIOR LUMBAR INTERBODY FUSION (PLIF)  ;  Surgeon: Eustace Moore, MD;  Location: [facility] NEURO ORS;  Service: Neurosurgery;  Laterality: N/A;  . REVERSE SHOULDER ARTHROPLASTY Left 03/01/2020   Procedure: REVERSE SHOULDER ARTHROPLASTY;  Surgeon: Corky Mull, MD;  Location: [facility] ORS;  Service: Orthopedics;  Laterality: Left;  . SHOULDER SURGERY     RIGHT   . TONSILLECTOMY  There were no vitals filed for this visit.   Subjective Assessment - 06/12/20 0908    Subjective Has a kidney stone that won't pass. Had to take half a pill of oxycodone. L shoulder is locking up more which bothers her. No pain currently.    Pertinent History S/P L reverse total shoulder replacment. Did not know about only PROM for now. Had home health PT for 2 weeks which involved PROM shoulder abduction, flexion as well as isometric shoulder flexion and abduction.  Pt sleeps on her bed.  Next surgeon appointment is either first or second week of December around Thurday of Friday.  Pt also states having ringing in her ears which started since the surgery.    Patient Stated Goals Raise her L arm better, be able to pick up things as well as work on crafts.    Currently in  Pain? No/denies    Pain Onset More than a month ago                                     PT Education - 06/12/20 1059    Education provided Yes    Education Details ther-ex    [employer]) Educated Patient    Methods Explanation;Demonstration;Tactile cues;Verbal cues    Comprehension Returned demonstration;Verbalized understanding          Objective  No latex band allergies  03/27/2020: Blood pressure R arm sitting, mechanically taken, normal cuff: 170/80, HR 59  Last home health session was about over a week ago  L shoulder incisions: healing, scab formation with slight redness around the scabs.  13weeks post op   MedbridgeAccess Code [email or web address]   Manual therapy   Seated STM L distal pectoralis muscle Seated STM L upper trap muscle  Seated STM L posterior shoulder to decrease muscle tension    Therapeutic exercise  Seated B scapular retraction 10x10 seconds  seatedL shoulder A/AROM with PT flexion 10x2 scaption 10x2 Abduction 10x2 ER 10x2  Seated manually resisted L shoulder isometrics  ER 10x2 with 5 second holds   IR 10x2 with 5 second holds   Shoulder locking up sensation  seated manually resisted L scapular retraction 10x5 seconds targeting lower trap  Improved exercise technique, movement at target joints, use of target muscles after mod verbal, visual, tactile cues.   Response to treatment Fair tolerance to today's session.    Clinical impression  Continued working on A/AROM exercises to decrease stiffness and improve ability to raise her arm with less difficulty. Fair tolerance to today's session. Pt will will benefit from continued skilled physical therapy services to improve ROM, strength and function.      PT Short Term Goals - 05/07/20 1009      PT SHORT TERM GOAL #1   Title Pt will be independent with her  initial HEP to improve ROM and ability to raise her arm with less difficulty when appropriate.    Baseline Pt has not been able to perform much of her HEP (05/07/2020)    Time 3    Period Weeks    Status On-going    Target Date 05/31/20             PT Long Term Goals - 05/07/20 1010      PT LONG TERM GOAL #1   Title Patient will improve L shoulder PROM flexion, and abduction AROM to at least 155 degrees, and ER to  80 degrees to promote ability to reach with less difficulty when appropriate.    Baseline PROM flexion 70 degrees, abduction 58 degrees, ER 5 degrees (scapular plane) 03/27/2020.; AAROM reclined: 125 degrees flexion, 95 degrees abduction, 22 degrees ER in scapular plane (05/07/20)    Time 12    Period Weeks    Status Partially Met    Target Date 06/21/20      PT LONG TERM GOAL #2   Title Pt will have at least 145 degrees L shoulder flexion and abduction AROM, and 75 degrees ER AROM to promote ability to reach with less difficulty when appropriate.    Baseline AROM not yet performed (03/27/2020);AAROM reclined: 125 degrees flexion, 95 degrees abduction, 22 degrees ER in scapular plane (05/07/20)    Time 12    Period Weeks    Status Partially Met    Target Date 06/21/20      PT LONG TERM GOAL #3   Title PT will improve her FOTO score by at least 20 points as a demonstration of improved function.    Baseline Shoulder FOTO: 34 (03/27/2020); 50 (05/07/2020)    Time 12    Period Weeks    Status Partially Met    Target Date 06/21/20      PT LONG TERM GOAL #4   Title Pt will have at least 4+/5 MMT grade L shoulder flexion, abduction, ER and IR to promote ability to perform functional tasks.    Baseline Manual resistance not yet performed. (05/07/2020)    Time 6    Period Weeks    Status New    Target Date 06/21/20                 Plan - 06/12/20 1059    Clinical Impression Statement Continued working on A/AROM exercises to decrease stiffness and improve ability to  raise her arm with less difficulty. Fair tolerance to today's session. Pt will will benefit from continued skilled physical therapy services to improve ROM, strength and function.    Personal Factors and Comorbidities Age;Comorbidity 3+;Fitness;Time since onset of injury/illness/exacerbation    Comorbidities anxiety, depression, DM, HTN    Examination-Activity Limitations Bathing;Hygiene/Grooming;Lift;Reach Overhead;Toileting;Carry    Stability/Clinical Decision Making Stable/Uncomplicated    Rehab Potential Fair    Clinical Impairments Affecting Rehab Potential Possible difficulty adhering to precautions. Pt needs constant reminder and cueing for PROM during the first few weeks of rehab.    PT Frequency 2x / week    PT Duration 12 weeks    PT Treatment/Interventions Therapeutic activities;Therapeutic exercise;Neuromuscular re-education;Patient/family education;Manual techniques;Passive range of motion;Dry needling;Vestibular;Canalith Repostioning;Electrical Stimulation;Iontophoresis 4mg /ml Dexamethasone    PT Next Visit Plan PROM, manual techniques, modalities PRN. [facility] Reverse Total Shoulder Replacement Protocol unless otherwise noted by MD.    [place] Access Code [facility]    Consulted and Agree with Plan of Care Patient           Patient will benefit from skilled therapeutic intervention in order to improve the following deficits and impairments:  Pain,Postural dysfunction,Improper body mechanics,Decreased strength,Decreased range of motion  Visit Diagnosis: Stiffness of left shoulder, not elsewhere classified  Chronic left shoulder pain  Muscle weakness (generalized)     Problem List Patient Active Problem List   Diagnosis Date Noted  . 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 edema associated with type 2 diabetes mellitus ([place]) 01/03/2019  . Chronic cough 11/19/2016  . Morbid obesity  ([place]) 09/26/2016  .  Left ovarian cyst 05/30/2016  . S/P lumbar spinal fusion 12/13/2015  . Aortic calcification ([place]) 07/18/2015  . Controlled type 2 diabetes mellitus without complication ([place]) 16/11/3708  . Thrombocytopenia ([place]) 05/16/2015  . Recurrent major depressive disorder, in full remission ([place]) 05/16/2015  . Essential (primary) hypertension 05/16/2015  . Fatty infiltration of liver 03/15/2015  . Aortic valve stenosis, nonrheumatic 01/18/2015  . Sciatica of right side 01/09/2015  . Type 2 diabetes mellitus ([place]) 11/10/2014  . RLS (restless legs syndrome) 08/23/2014  . Neuritis or radiculitis due to rupture of lumbar intervertebral disc 06/13/2014  . Degeneration of intervertebral disc of lumbar region 06/13/2014  . Lumbar radiculitis 06/13/2014  . Arthritis 01/23/2014  . Arthritis of knee, degenerative 11/15/2013  . Depression 09/29/2013  . Insomnia 09/29/2013  . Apnea, sleep 08/29/2013  . History of nephrolithiasis 08/29/2013  . Abnormal presence of protein in urine 08/29/2013  . Headache, migraine 08/29/2013  . BP (high blood pressure) 08/29/2013  . HLD (hyperlipidemia) 08/29/2013  . Allergic rhinitis 08/29/2013  . Intractable migraine without aura 02/18/2013    Joneen Boers PT, DPT   06/12/2020, 11:06 AM  [facility] [address] [place], [address] Phone: [phone]   Fax:  [phone]  Name: Jessica Cole MRN: [id number] Date of Birth: 02-Apr-1951

## 2020-06-13 ENCOUNTER — Other Ambulatory Visit: Payer: Medicare HMO

## 2020-06-14 ENCOUNTER — Ambulatory Visit: Payer: Medicare HMO

## 2020-06-14 ENCOUNTER — Ambulatory Visit
Admission: RE | Admit: 2020-06-14 | Discharge: 2020-06-14 | Disposition: A | Discharge status: Home / Self Care | Payer: Medicare HMO | Attending: Urology | Admitting: Urology

## 2020-06-14 ENCOUNTER — Encounter
Admission: RE | Disposition: A | Discharge status: Home / Self Care | Payer: Self-pay | Source: Home / Self Care | Attending: Urology

## 2020-06-14 DIAGNOSIS — Z833 Family history of diabetes mellitus: Secondary | ICD-10-CM | POA: Diagnosis not present

## 2020-06-14 DIAGNOSIS — Z7982 Long term (current) use of aspirin: Secondary | ICD-10-CM | POA: Insufficient documentation

## 2020-06-14 DIAGNOSIS — Z801 Family history of malignant neoplasm of trachea, bronchus and lung: Secondary | ICD-10-CM | POA: Insufficient documentation

## 2020-06-14 DIAGNOSIS — E119 Type 2 diabetes mellitus without complications: Secondary | ICD-10-CM | POA: Insufficient documentation

## 2020-06-14 DIAGNOSIS — N200 Calculus of kidney: Secondary | ICD-10-CM

## 2020-06-14 DIAGNOSIS — I209 Angina pectoris, unspecified: Secondary | ICD-10-CM | POA: Insufficient documentation

## 2020-06-14 DIAGNOSIS — N201 Calculus of ureter: Secondary | ICD-10-CM | POA: Insufficient documentation

## 2020-06-14 DIAGNOSIS — Z820 Family history of epilepsy and other diseases of the nervous system: Secondary | ICD-10-CM | POA: Diagnosis not present

## 2020-06-14 DIAGNOSIS — Z8261 Family history of arthritis: Secondary | ICD-10-CM | POA: Insufficient documentation

## 2020-06-14 DIAGNOSIS — Z888 Allergy status to other drugs, medicaments and biological substances status: Secondary | ICD-10-CM | POA: Insufficient documentation

## 2020-06-14 DIAGNOSIS — Z87442 Personal history of urinary calculi: Secondary | ICD-10-CM | POA: Diagnosis not present

## 2020-06-14 DIAGNOSIS — I10 Essential (primary) hypertension: Secondary | ICD-10-CM | POA: Insufficient documentation

## 2020-06-14 DIAGNOSIS — Z8249 Family history of ischemic heart disease and other diseases of the circulatory system: Secondary | ICD-10-CM | POA: Diagnosis not present

## 2020-06-14 HISTORY — PX: EXTRACORPOREAL SHOCK WAVE LITHOTRIPSY: SHX1557

## 2020-06-14 LAB — GLUCOSE, CAPILLARY: Glucose-Capillary: 161 mg/dL — ABNORMAL HIGH (ref 70–99)

## 2020-06-14 SURGERY — LITHOTRIPSY, ESWL
Anesthesia: Moderate Sedation | Laterality: Left

## 2020-06-14 MED ORDER — DIPHENHYDRAMINE HCL 25 MG PO CAPS
ORAL_CAPSULE | ORAL | Status: AC
  Filled 2020-06-14: qty 1

## 2020-06-14 MED ORDER — ACETAMINOPHEN 500 MG PO TABS
ORAL_TABLET | ORAL | Status: AC
  Administered 2020-06-14: 1000 mg via ORAL
  Filled 2020-06-14: qty 2

## 2020-06-14 MED ORDER — TAMSULOSIN HCL 0.4 MG PO CAPS: 0.4000 mg | ORAL_CAPSULE | Freq: Every day | ORAL | 0 refills | Status: DC

## 2020-06-14 MED ORDER — CEPHALEXIN 500 MG PO CAPS: 500.0000 mg | ORAL_CAPSULE | ORAL | Status: AC

## 2020-06-14 MED ORDER — CEPHALEXIN 500 MG PO CAPS
ORAL_CAPSULE | ORAL | Status: AC
  Administered 2020-06-14: 500 mg via ORAL
  Filled 2020-06-14: qty 1

## 2020-06-14 MED ORDER — DIPHENHYDRAMINE HCL 25 MG PO CAPS
25.0000 mg | ORAL_CAPSULE | ORAL | Status: AC
  Administered 2020-06-14: 25 mg via ORAL

## 2020-06-14 MED ORDER — HYDROMORPHONE HCL 1 MG/ML IJ SOLN
0.5000 mg | INTRAMUSCULAR | Status: DC | PRN
  Administered 2020-06-14: 0.5 mg via INTRAVENOUS

## 2020-06-14 MED ORDER — ACETAMINOPHEN 500 MG PO TABS: 1000.0000 mg | ORAL_TABLET | Freq: Once | ORAL | Status: AC

## 2020-06-14 MED ORDER — SODIUM CHLORIDE 0.9 % IV SOLN: INTRAVENOUS | Status: DC

## 2020-06-14 MED ORDER — HYDROMORPHONE HCL 1 MG/ML IJ SOLN
INTRAMUSCULAR | Status: AC
  Filled 2020-06-14: qty 1

## 2020-06-14 NOTE — Interval H&P Note (Signed)
UROLOGY H&P UPDATE  Agree with prior H&P dated 06/05/20.  Cardiac: RRR Lungs: CTA bilaterally  Laterality: LEFT Procedure: Shockwave lithotripsy  Urine: culture 2/1 lactobacillus contaminant  Informed consent obtained, we specifically discussed the risks of bleeding/hematoma, infection, post-operative pain, obstructive fragments/steinstrasse, need for additional procedures.  Sondra Come, MD 06/14/2020

## 2020-06-14 NOTE — Progress Notes (Signed)
Patient doing well. Still has pain. asking when she can go home. spO2 is 98-100% on room air. Instructed patient to wear CPAP at home even if she naps today due to the medication she received today. Voiced understanding. Went over [facility] instructions, gave patient a urine hat, urine cup and strainer to strain her urine. Also gave incentive spirometer per Dr Richardo Hanks.  Discharge instructions went over with the patient and her husband.

## 2020-06-14 NOTE — Progress Notes (Signed)
Patient given IV hydromorphone. Patient resting with eyes closed. Has sleep apnea, patient placed on spO2. Patient was 77% on room air asleep. Put patient on 4L [place] and spO2 increased to 97%. Patient states pain is much better. Will continue to monitor.

## 2020-06-14 NOTE — Brief Op Note (Signed)
06/14/2020  8:29 AM  PATIENT:  Jessica Cole  70 y.o. female  PRE-OPERATIVE DIAGNOSIS:  Left UPJ stone, 49mm  POST-OPERATIVE DIAGNOSIS:  Same  PROCEDURE:  Procedure(s): EXTRACORPOREAL SHOCK WAVE LITHOTRIPSY (ESWL) (Left)  SURGEON:  Surgeon(s) and Role:    * Sondra Come, MD - Primary  ANESTHESIA: Conscious Sedation  EBL:  None  Drains: None  Specimen: None  Findings:  1. Smudging of stone at conclusion of case, challenging SWL secondary to patient movement/respirations. Consider URS/LL for future stone episodes  DISPO: Flomax, pain meds PRN, RTC 2 weeks KUB  Legrand Rams, MD 06/14/2020

## 2020-06-14 NOTE — Discharge Instructions (Signed)
AMBULATORY SURGERY  DISCHARGE INSTRUCTIONS   1) The drugs that you were given will stay in your system until tomorrow so for the next 24 hours you should not:  A) Drive an automobile B) Make any legal decisions C) Drink any alcoholic beverage   2) You may resume regular meals tomorrow.  Today it is better to start with liquids and gradually work up to solid foods.  You may eat anything you prefer, but it is better to start with liquids, then soup and crackers, and gradually work up to solid foods.   3) Please notify your doctor immediately if you have any unusual bleeding, trouble breathing, redness and pain at the surgery site, drainage, fever, or pain not relieved by medication.    4) Additional Instructions: Drink plenty of water or Gatorade.  No caffeine or alcohol Strain all urine.  Follow [place] stone instructions  Please contact your physician with any problems or Same Day Surgery at [phone], Monday through Friday 6 am to 4 pm, or [place] [facility] number at [phone].

## 2020-06-15 ENCOUNTER — Encounter: Payer: Self-pay | Admitting: Urology

## 2020-06-19 ENCOUNTER — Ambulatory Visit: Payer: Medicare HMO

## 2020-06-19 ENCOUNTER — Other Ambulatory Visit: Payer: Self-pay

## 2020-06-19 DIAGNOSIS — M25612 Stiffness of left shoulder, not elsewhere classified: Secondary | ICD-10-CM

## 2020-06-19 DIAGNOSIS — M6281 Muscle weakness (generalized): Secondary | ICD-10-CM

## 2020-06-19 DIAGNOSIS — G8929 Other chronic pain: Secondary | ICD-10-CM

## 2020-06-19 DIAGNOSIS — M25512 Pain in left shoulder: Secondary | ICD-10-CM

## 2020-06-19 NOTE — Therapy (Signed)
[facility] [address] [place], [address] Phone: [phone]   Fax:  [phone]  Physical Therapy Treatment  Patient Details  Name: Jessica Cole MRN: [id number] Date of Birth: [birth year] Referring Provider (PT): Lattie Corns, [place]   Encounter Date: 06/19/2020   PT End of Session - 06/19/20 1001    Visit Number 17    Number of Visits 25    Date for PT Re-Evaluation 06/21/20    Authorization Type 7    Authorization Time Period of 10 progress report    PT Start Time 1001    PT Stop Time 1040    PT Time Calculation (min) 39 min    Activity Tolerance Patient tolerated treatment well    Behavior During Therapy [facility] for tasks assessed/performed           Past Medical History:  Diagnosis Date  . Anxiety   . Asthma    seasonal with allergies  . Colon polyps   . Degenerative arthritis   . Depression   . Diabetes mellitus without complication ([place])    Type II  . Environmental allergies   . Family history of adverse reaction to anesthesia    sister- PONV  . Fatty liver   . Glaucoma   . Headache(784.0)    HX  MIGRAINES  . History of bronchitis   . History of kidney stones   . History of pneumonia   . Hypertension   . Hypothyroidism   . Obesity   . OSA on CPAP   . Plantar fasciitis    right  . PONV (postoperative nausea and vomiting)   . Renal calculi   . Renal calculi   . RLS (restless legs syndrome) 08/23/2014    Past Surgical History:  Procedure Laterality Date  . ABDOMINAL HYSTERECTOMY     partial  . APPENDECTOMY    . Arthroscopic surgery, knee Left   . BREAST BIOPSY Right    several  . BREAST BIOPSY Right 03/11/2017   u/s bx neg  . BUNIONECTOMY     LEFT   . COLONOSCOPY W/ POLYPECTOMY    . COLONOSCOPY WITH PROPOFOL N/A 08/14/2017   Procedure: COLONOSCOPY WITH PROPOFOL;  Surgeon: Lollie Sails, MD;  Location: [facility] ENDOSCOPY;  Service: Endoscopy;  Laterality: N/A;  .  EXTRACORPOREAL SHOCK WAVE LITHOTRIPSY Left 05/14/2017   Procedure: EXTRACORPOREAL SHOCK WAVE LITHOTRIPSY (ESWL);  Surgeon: Abbie Sons, MD;  Location: [facility] ORS;  Service: Urology;  Laterality: Left;  . EXTRACORPOREAL SHOCK WAVE LITHOTRIPSY Left 06/14/2020   Procedure: EXTRACORPOREAL SHOCK WAVE LITHOTRIPSY (ESWL);  Surgeon: Billey Co, MD;  Location: [facility] ORS;  Service: Urology;  Laterality: Left;  . FOOT SURGERY     RIGHT    . KNEE ARTHROSCOPY W/ MENISCAL REPAIR Left   . LASIK    . MAXIMUM ACCESS (MAS)POSTERIOR LUMBAR INTERBODY FUSION (PLIF) 1 LEVEL N/A 04/25/2016   Procedure: LUMBAR FOUR-FIVE  MAXIMUM ACCESS (MAS) POSTERIOR LUMBAR INTERBODY FUSION (PLIF) with extension of instrumentation LUMBAR TWO-FIVE;  Surgeon: Eustace Moore, MD;  Location: [facility];  Service: Neurosurgery;  Laterality: N/A;  . MAXIMUM ACCESS (MAS)POSTERIOR LUMBAR INTERBODY FUSION (PLIF) 2 LEVEL N/A 12/13/2015   Procedure: Lumbar two-three - Lumbar three-four MAXIMUM ACCESS (MAS) POSTERIOR LUMBAR INTERBODY FUSION (PLIF)  ;  Surgeon: Eustace Moore, MD;  Location: [facility] NEURO ORS;  Service: Neurosurgery;  Laterality: N/A;  . REVERSE SHOULDER ARTHROPLASTY Left 03/01/2020   Procedure: REVERSE SHOULDER ARTHROPLASTY;  Surgeon: Corky Mull, MD;  Location: [facility] ORS;  Service: Orthopedics;  Laterality: Left;  . SHOULDER SURGERY     RIGHT   . TONSILLECTOMY      There were no vitals filed for this visit.   Subjective Assessment - 06/19/20 1003    Subjective Low back bothered her more after the kidney stone procedure. Currently sore at her low back area but a whole lot better though. L shoulder seems to be locking up a little bit more. Was not able to exercise because she was half asleep after the procedure. Sees Dr. Roland Rack next week. No L shoulder pain currently.    Pertinent History S/P L reverse total shoulder replacment. Did not know about only PROM for now. Had home health PT for 2 weeks which involved PROM shoulder abduction,  flexion as well as isometric shoulder flexion and abduction.  Pt sleeps on her bed.  Next surgeon appointment is either first or second week of December around Thurday of Friday.  Pt also states having ringing in her ears which started since the surgery.    Patient Stated Goals Raise her L arm better, be able to pick up things as well as work on crafts.    Currently in Pain? No/denies    Pain Score 0-No pain    Pain Onset More than a month ago                                     PT Education - 06/19/20 1009    Education provided Yes    Education Details ther-ex    [employer]) Educated Patient    Methods Explanation;Demonstration;Tactile cues;Verbal cues    Comprehension Returned demonstration;Verbalized understanding          Objective  No latex band allergies  03/27/2020: Blood pressure R arm sitting, mechanically taken, normal cuff: 170/80, HR 59  Last home health session was about over a week ago  L shoulder incisions: healing, scab formation with slight redness around the scabs.  15-16weeks post op   MedbridgeAccess Code [facility]  Therapeutic exercise  seatedL shoulder A/AROM with PT flexion 10x2 scaption 10x2 Abduction 10x2 ER 10x2  Standing B scapular retraction yellow band 10x3  L scapular punches 10x yellow band. Challenging  L shoulder extension with scapular retraction yellow band 10x. Challenging   Standing L shoulder isometrics    ER 10x2 with 5 second holds   seated manually resisted L scapular retraction 10x5 seconds targeting lower trap for 3 sets  L shoulder AROM   Flexion 10x2  scaption 10x2  Abduction 10x2   ER with elbows at side 10x2   R cervical side bend to stretch L upper trap/lateral neck area secondary to shrugging from AROM 10x10 seconds     Improved exercise technique, movement at target joints, use of target muscles after  mod verbal, visual, tactile cues.   Response to treatment  Pt tolerated session well without aggravation of symptoms.   Clinical impression Continued working on A/AROM exercises to decrease stiffness and improve ability to raise her arm up against gravity. Also worked on scapular strengthening to promote better mechanics with shoulder movements. Pt tolerated session well without aggravation of symptoms. Pt will benefit from continued skilled physical therapy services to decrease stiffness, pain, improve AROM, strength, and function.       PT Short Term Goals - 05/07/20 1009      PT  SHORT TERM GOAL #1   Title Pt will be independent with her initial HEP to improve ROM and ability to raise her arm with less difficulty when appropriate.    Baseline Pt has not been able to perform much of her HEP (05/07/2020)    Time 3    Period Weeks    Status On-going    Target Date 05/31/20             PT Long Term Goals - 05/07/20 1010      PT LONG TERM GOAL #1   Title Patient will improve L shoulder PROM flexion, and abduction AROM to at least 155 degrees, and ER to 80 degrees to promote ability to reach with less difficulty when appropriate.    Baseline PROM flexion 70 degrees, abduction 58 degrees, ER 5 degrees (scapular plane) 03/27/2020.; AAROM reclined: 125 degrees flexion, 95 degrees abduction, 22 degrees ER in scapular plane (05/07/20)    Time 12    Period Weeks    Status Partially Met    Target Date 06/21/20      PT LONG TERM GOAL #2   Title Pt will have at least 145 degrees L shoulder flexion and abduction AROM, and 75 degrees ER AROM to promote ability to reach with less difficulty when appropriate.    Baseline AROM not yet performed (03/27/2020);AAROM reclined: 125 degrees flexion, 95 degrees abduction, 22 degrees ER in scapular plane (05/07/20)    Time 12    Period Weeks    Status Partially Met    Target Date 06/21/20      PT LONG TERM GOAL #3   Title PT will improve her  FOTO score by at least 20 points as a demonstration of improved function.    Baseline Shoulder FOTO: 34 (03/27/2020); 50 (05/07/2020)    Time 12    Period Weeks    Status Partially Met    Target Date 06/21/20      PT LONG TERM GOAL #4   Title Pt will have at least 4+/5 MMT grade L shoulder flexion, abduction, ER and IR to promote ability to perform functional tasks.    Baseline Manual resistance not yet performed. (05/07/2020)    Time 6    Period Weeks    Status New    Target Date 06/21/20                 Plan - 06/19/20 1036    Clinical Impression Statement Continued working on A/AROM exercises to decrease stiffness and improve ability to raise her arm up against gravity. Also worked on scapular strengthening to promote better mechanics with shoulder movements. Pt tolerated session well without aggravation of symptoms. Pt will benefit from continued skilled physical therapy services to decrease stiffness, pain, improve AROM, strength, and function.    Personal Factors and Comorbidities Age;Comorbidity 3+;Fitness;Time since onset of injury/illness/exacerbation    Comorbidities anxiety, depression, DM, HTN    Examination-Activity Limitations Bathing;Hygiene/Grooming;Lift;Reach Overhead;Toileting;Carry    Stability/Clinical Decision Making Stable/Uncomplicated    Rehab Potential Fair    Clinical Impairments Affecting Rehab Potential Possible difficulty adhering to precautions. Pt needs constant reminder and cueing for PROM during the first few weeks of rehab.    PT Frequency 2x / week    PT Duration 12 weeks    PT Treatment/Interventions Therapeutic activities;Therapeutic exercise;Neuromuscular re-education;Patient/family education;Manual techniques;Passive range of motion;Dry needling;Vestibular;Canalith Repostioning;Electrical Stimulation;Iontophoresis 66m/ml Dexamethasone    PT Next Visit Plan PROM, manual techniques, modalities PRN. [facility]  Total Shoulder Replacement Protocol unless otherwise noted by MD.    PT Home Exercise Plan Medbridge Access Code [facility]    Consulted and Agree with Plan of Care Patient           Patient will benefit from skilled therapeutic intervention in order to improve the following deficits and impairments:  Pain,Postural dysfunction,Improper body mechanics,Decreased strength,Decreased range of motion  Visit Diagnosis: Stiffness of left shoulder, not elsewhere classified  Chronic left shoulder pain  Muscle weakness (generalized)     Problem List Patient Active Problem List   Diagnosis Date Noted  . 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 edema associated with type 2 diabetes mellitus ([place]) 01/03/2019  . Chronic cough 11/19/2016  . Morbid obesity ([place]) 09/26/2016  . Left ovarian cyst 05/30/2016  . S/P lumbar spinal fusion 12/13/2015  . Aortic calcification ([place]) 07/18/2015  . Controlled type 2 diabetes mellitus without complication ([place]) 41/96/2229  . Thrombocytopenia ([place]) 05/16/2015  . Recurrent major depressive disorder, in full remission ([place]) 05/16/2015  . Essential (primary) hypertension 05/16/2015  . Fatty infiltration of liver 03/15/2015  . Aortic valve stenosis, nonrheumatic 01/18/2015  . Sciatica of right side 01/09/2015  . Type 2 diabetes mellitus ([place]) 11/10/2014  . RLS (restless legs syndrome) 08/23/2014  . Neuritis or radiculitis due to rupture of lumbar intervertebral disc 06/13/2014  . Degeneration of intervertebral disc of lumbar region 06/13/2014  . Lumbar radiculitis 06/13/2014  . Arthritis 01/23/2014  . Arthritis of knee, degenerative 11/15/2013  . Depression 09/29/2013  . Insomnia 09/29/2013  . Apnea, sleep 08/29/2013  . History of nephrolithiasis 08/29/2013  . Abnormal presence of protein in urine 08/29/2013  . Headache, migraine 08/29/2013  . BP (high blood pressure) 08/29/2013  . HLD (hyperlipidemia) 08/29/2013  .  Allergic rhinitis 08/29/2013  . Intractable migraine without aura 02/18/2013     Joneen Boers PT, DPT  06/19/2020, 10:52 AM  [facility] [address], [place], [address] Phone: [phone]   Fax:  [phone]  Name: Jessica Cole MRN: [id number] Date of Birth: January 09, 1951

## 2020-06-21 ENCOUNTER — Other Ambulatory Visit: Payer: Self-pay

## 2020-06-21 ENCOUNTER — Ambulatory Visit: Payer: Medicare HMO

## 2020-06-21 DIAGNOSIS — M25612 Stiffness of left shoulder, not elsewhere classified: Secondary | ICD-10-CM | POA: Diagnosis not present

## 2020-06-21 DIAGNOSIS — G8929 Other chronic pain: Secondary | ICD-10-CM

## 2020-06-21 DIAGNOSIS — M6281 Muscle weakness (generalized): Secondary | ICD-10-CM

## 2020-06-21 NOTE — Therapy (Signed)
[facility] [address], [place], [address] Phone: [phone]   Fax:  [phone]  Physical Therapy Treatment  Patient Details  Name: Jessica Cole MRN: [id number] Date of Birth: 28-Mar-1951 Referring Provider (PT): Lattie Corns, [place]   Encounter Date: 06/21/2020   PT End of Session - 06/21/20 0901    Visit Number 18    Number of Visits 37    Date for PT Re-Evaluation 08/02/20    Authorization Type 8    Authorization Time Period of 10 progress report    PT Start Time 0901    PT Stop Time 0943    PT Time Calculation (min) 42 min    Activity Tolerance Patient tolerated treatment well    Behavior During Therapy [facility] for tasks assessed/performed           Past Medical History:  Diagnosis Date  . Anxiety   . Asthma    seasonal with allergies  . Colon polyps   . Degenerative arthritis   . Depression   . Diabetes mellitus without complication ([place])    Type II  . Environmental allergies   . Family history of adverse reaction to anesthesia    sister- PONV  . Fatty liver   . Glaucoma   . Headache(784.0)    HX  MIGRAINES  . History of bronchitis   . History of kidney stones   . History of pneumonia   . Hypertension   . Hypothyroidism   . Obesity   . OSA on CPAP   . Plantar fasciitis    right  . PONV (postoperative nausea and vomiting)   . Renal calculi   . Renal calculi   . RLS (restless legs syndrome) 08/23/2014    Past Surgical History:  Procedure Laterality Date  . ABDOMINAL HYSTERECTOMY     partial  . APPENDECTOMY    . Arthroscopic surgery, knee Left   . BREAST BIOPSY Right    several  . BREAST BIOPSY Right 03/11/2017   u/s bx neg  . BUNIONECTOMY     LEFT   . COLONOSCOPY W/ POLYPECTOMY    . COLONOSCOPY WITH PROPOFOL N/A 08/14/2017   Procedure: COLONOSCOPY WITH PROPOFOL;  Surgeon: Lollie Sails, MD;  Location: [facility] ENDOSCOPY;  Service: Endoscopy;  Laterality: N/A;  .  EXTRACORPOREAL SHOCK WAVE LITHOTRIPSY Left 05/14/2017   Procedure: EXTRACORPOREAL SHOCK WAVE LITHOTRIPSY (ESWL);  Surgeon: Abbie Sons, MD;  Location: [facility] ORS;  Service: Urology;  Laterality: Left;  . EXTRACORPOREAL SHOCK WAVE LITHOTRIPSY Left 06/14/2020   Procedure: EXTRACORPOREAL SHOCK WAVE LITHOTRIPSY (ESWL);  Surgeon: Billey Co, MD;  Location: [facility] ORS;  Service: Urology;  Laterality: Left;  . FOOT SURGERY     RIGHT    . KNEE ARTHROSCOPY W/ MENISCAL REPAIR Left   . LASIK    . MAXIMUM ACCESS (MAS)POSTERIOR LUMBAR INTERBODY FUSION (PLIF) 1 LEVEL N/A 04/25/2016   Procedure: LUMBAR FOUR-FIVE  MAXIMUM ACCESS (MAS) POSTERIOR LUMBAR INTERBODY FUSION (PLIF) with extension of instrumentation LUMBAR TWO-FIVE;  Surgeon: Eustace Moore, MD;  Location: [facility];  Service: Neurosurgery;  Laterality: N/A;  . MAXIMUM ACCESS (MAS)POSTERIOR LUMBAR INTERBODY FUSION (PLIF) 2 LEVEL N/A 12/13/2015   Procedure: Lumbar two-three - Lumbar three-four MAXIMUM ACCESS (MAS) POSTERIOR LUMBAR INTERBODY FUSION (PLIF)  ;  Surgeon: Eustace Moore, MD;  Location: [facility] NEURO ORS;  Service: Neurosurgery;  Laterality: N/A;  . REVERSE SHOULDER ARTHROPLASTY Left 03/01/2020   Procedure: REVERSE SHOULDER ARTHROPLASTY;  Surgeon: Corky Mull, MD;  Location: [facility] ORS;  Service: Orthopedics;  Laterality: Left;  . SHOULDER SURGERY     RIGHT   . TONSILLECTOMY      There were no vitals filed for this visit.   Subjective Assessment - 06/21/20 0903    Subjective Rough morning. Does not sleep well. L shoulder is sore. Too tired to do anything. Has not locked up in a few days.    Pertinent History S/P L reverse total shoulder replacment. Did not know about only PROM for now. Had home health PT for 2 weeks which involved PROM shoulder abduction, flexion as well as isometric shoulder flexion and abduction.  Pt sleeps on her bed.  Next surgeon appointment is either first or second week of December around Thurday of Friday.  Pt also states  having ringing in her ears which started since the surgery.    Patient Stated Goals Raise her L arm better, be able to pick up things as well as work on crafts.    Currently in Pain? No/denies   Just soreness.   Pain Onset More than a month ago              [facility] PT Assessment - 06/21/20 0904      AROM   Left Shoulder Flexion 118 Degrees    Left Shoulder ABduction 115 Degrees    Left Shoulder Internal Rotation 60 Degrees    Left Shoulder External Rotation 43 Degrees      Strength   Left Shoulder Flexion 4/5    Left Shoulder ABduction 4+/5    Left Shoulder Internal Rotation 4+/5    Left Shoulder External Rotation 4/5                                 PT Education - 06/21/20 0958    Education provided Yes    Education Details ther-ex, HEP, plan of care    Person(s) Educated Patient    Methods Explanation;Demonstration;Tactile cues;Verbal cues;Handout    Comprehension Verbalized understanding;Returned demonstration           Objective  No latex band allergies  03/27/2020: Blood pressure R arm sitting, mechanically taken, normal cuff: 170/80, HR 59  Last home health session was about over a week ago  L shoulder incisions: healing, scab formation with slight redness around the scabs.  16weeks post op   MedbridgeAccess Code [facility]  Therapeutic exercise  L shoulder standing AROM: flexion 118 degrees, 115 degrees, ER (at about 90 degrees abduction): 43 degrees, IR (at about 90 degrees abduction): 60 degrees  Manually resisted L shoulder flexion 4/5, abduction 4+/5, ER 4/5, IR 4+/5  seatedL shoulder A/AROMwith PT flexion 10x2 scaption 10x2 Abduction 10x2 ER 10x2  Standing L shoulder isometrics              ER 10x2with 5 second holds   Standing B scapular retraction yellow band 10x3 with 5 second holds    L shoulder extension with scapular retraction yellow band 10x     Improved exercise technique, movement at target joints, use of target muscles after mod verbal, visual, tactile cues.   Response to treatment Pt tolerated session well without aggravation of symptoms.   Clinical impression Pt demnonstrates overall improved L shoulder strength (4 to 4+/5), as well as improving ability to raise her L arm up against gravity compared to previous sessions. Stiff end feel. Still demonstrates some locking sensation  at times. Pt will benefit from continued skilled physical therapy services to decrease stiffness, improve AROM, strength, and function.          PT Short Term Goals - 05/07/20 1009      PT SHORT TERM GOAL #1   Title Pt will be independent with her initial HEP to improve ROM and ability to raise her arm with less difficulty when appropriate.    Baseline Pt has not been able to perform much of her HEP (05/07/2020)    Time 3    Period Weeks    Status On-going    Target Date 05/31/20             PT Long Term Goals - 06/21/20 0917      PT LONG TERM GOAL #1   Title Patient will improve L shoulder PROM flexion, and abduction AROM to at least 155 degrees, and ER to 80 degrees to promote ability to reach with less difficulty when appropriate.    Baseline PROM flexion 70 degrees, abduction 58 degrees, ER 5 degrees (scapular plane) 03/27/2020.; AAROM reclined: 125 degrees flexion, 95 degrees abduction, 22 degrees ER in scapular plane (05/07/20);L shoulder standing AROM: flexion 118 degrees, 115 degrees, ER (at about 90 degrees abduction): 43 degrees, IR (at about 90 degrees abduction): 60 degrees (06/21/2020)    Time 6    Period Weeks    Status Partially Met    Target Date 08/02/20      PT LONG TERM GOAL #2   Title Pt will have at least 145 degrees L shoulder flexion and abduction AROM, and 75 degrees ER AROM to promote ability to reach with less difficulty when appropriate.    Baseline AROM not yet performed (03/27/2020);AAROM reclined: 125  degrees flexion, 95 degrees abduction, 22 degrees ER in scapular plane (05/07/20);L shoulder standing AROM: flexion 118 degrees, 115 degrees, ER (at about 90 degrees abduction): 43 degrees, IR (at about 90 degrees abduction): 60 degrees    Time 6    Period Weeks    Status Partially Met    Target Date 08/02/20      PT LONG TERM GOAL #3   Title PT will improve her FOTO score by at least 20 points as a demonstration of improved function.    Baseline Shoulder FOTO: 34 (03/27/2020); 50 (05/07/2020); 49 (06/07/2020)    Time 6    Period Weeks    Status Partially Met    Target Date 08/02/20      PT LONG TERM GOAL #4   Title Pt will have at least 4+/5 MMT grade L shoulder flexion, abduction, ER and IR to promote ability to perform functional tasks.    Baseline Manual resistance not yet performed. (05/07/2020); L shoulder flexion 4/5, abduction 4+/5, ER 4/5, IR 4+/5   (06/21/2020)    Time 6    Period Weeks    Status Partially Met    Target Date 08/02/20                 Plan - 06/21/20 1000    Clinical Impression Statement Pt demnonstrates overall improved L shoulder strength (4 to 4+/5), as well as improving ability to raise her L arm up against gravity compared to previous sessions. Stiff end feel. Still demonstrates some locking sensation at times. Pt will benefit from continued skilled physical therapy services to decrease stiffness, improve AROM, strength, and function.    Personal Factors and Comorbidities Age;Comorbidity 3+;Fitness;Time since onset of injury/illness/exacerbation    Comorbidities  anxiety, depression, DM, HTN    Examination-Activity Limitations Bathing;Hygiene/Grooming;Lift;Reach Overhead;Toileting;Carry    Stability/Clinical Decision Making Stable/Uncomplicated    Clinical Decision Making Low    Rehab Potential Fair    Clinical Impairments Affecting Rehab Potential Possible difficulty adhering to precautions. Pt needs constant reminder and cueing for PROM during the first  few weeks of rehab.    PT Frequency 2x / week    PT Duration 6 weeks    PT Treatment/Interventions Therapeutic activities;Therapeutic exercise;Neuromuscular re-education;Patient/family education;Manual techniques;Passive range of motion;Dry needling;Vestibular;Canalith Repostioning;Electrical Stimulation;Iontophoresis 4mg /ml Dexamethasone    PT Next Visit Plan PROM, manual techniques, modalities PRN. [facility] Reverse Total Shoulder Replacement Protocol unless otherwise noted by MD.    [place] Access Code [facility]    Consulted and Agree with Plan of Care Patient           Patient will benefit from skilled therapeutic intervention in order to improve the following deficits and impairments:  Pain,Postural dysfunction,Improper body mechanics,Decreased strength,Decreased range of motion  Visit Diagnosis: Stiffness of left shoulder, not elsewhere classified  Chronic left shoulder pain  Muscle weakness (generalized)     Problem List Patient Active Problem List   Diagnosis Date Noted  . 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 edema associated with type 2 diabetes mellitus ([place]) 01/03/2019  . Chronic cough 11/19/2016  . Morbid obesity ([place]) 09/26/2016  . Left ovarian cyst 05/30/2016  . S/P lumbar spinal fusion 12/13/2015  . Aortic calcification ([place]) 07/18/2015  . Controlled type 2 diabetes mellitus without complication ([place]) 93/79/0240  . Thrombocytopenia ([place]) 05/16/2015  . Recurrent major depressive disorder, in full remission ([place]) 05/16/2015  . Essential (primary) hypertension 05/16/2015  . Fatty infiltration of liver 03/15/2015  . Aortic valve stenosis, nonrheumatic 01/18/2015  . Sciatica of right side 01/09/2015  . Type 2 diabetes mellitus ([place]) 11/10/2014  . RLS (restless legs syndrome) 08/23/2014  . Neuritis or radiculitis due to rupture of lumbar intervertebral disc 06/13/2014  .  Degeneration of intervertebral disc of lumbar region 06/13/2014  . Lumbar radiculitis 06/13/2014  . Arthritis 01/23/2014  . Arthritis of knee, degenerative 11/15/2013  . Depression 09/29/2013  . Insomnia 09/29/2013  . Apnea, sleep 08/29/2013  . History of nephrolithiasis 08/29/2013  . Abnormal presence of protein in urine 08/29/2013  . Headache, migraine 08/29/2013  . BP (high blood pressure) 08/29/2013  . HLD (hyperlipidemia) 08/29/2013  . Allergic rhinitis 08/29/2013  . Intractable migraine without aura 02/18/2013    Joneen Boers PT, DPT   06/21/2020, 10:08 AM  [facility] [address], [place], [address] Phone: [phone]   Fax:  [phone]  Name: Jessica Cole MRN: [id number] Date of Birth: 1951/03/16

## 2020-06-21 NOTE — Patient Instructions (Signed)
Access Code: [facility] URL: [email or web address] Date: 06/21/2020 Prepared by: Loralyn Freshwater  Exercises Seated Shoulder Flexion Towel Slide at Table Top - 1 x daily - 7 x weekly - 3 sets - 10 reps Supine Shoulder Flexion AAROM - 1 x daily - 7 x weekly - 3 sets - 10 reps Shoulder Scaption AAROM with Dowel - 1 x daily - 7 x weekly - 3 sets - 10 reps Supine Shoulder External Rotation in Scaption AAROM - 1 x daily - 7 x weekly - 3 sets - 10 reps Standing Isometric Shoulder External Rotation with Doorway - 1 x daily - 7 x weekly - 3 sets - 10 reps - 5 seconds hold Scapular Retraction with Resistance - 1 x daily - 7 x weekly - 2-3 sets - 10 reps - 5 seconds hold

## 2020-06-26 ENCOUNTER — Other Ambulatory Visit: Payer: Self-pay

## 2020-06-26 ENCOUNTER — Ambulatory Visit: Payer: Medicare HMO

## 2020-06-26 DIAGNOSIS — M25512 Pain in left shoulder: Secondary | ICD-10-CM

## 2020-06-26 DIAGNOSIS — M25612 Stiffness of left shoulder, not elsewhere classified: Secondary | ICD-10-CM

## 2020-06-26 DIAGNOSIS — M6281 Muscle weakness (generalized): Secondary | ICD-10-CM

## 2020-06-26 DIAGNOSIS — G8929 Other chronic pain: Secondary | ICD-10-CM

## 2020-06-26 NOTE — Therapy (Signed)
[facility] [address], [place], [address] Phone: [phone]   Fax:  [phone]  Physical Therapy Treatment  Patient Details  Name: Jessica Cole MRN: [id number] Date of Birth: Sep 23, 1950 Referring Provider (PT): Lattie Corns, [place]   Encounter Date: 06/26/2020   PT End of Session - 06/26/20 0947    Visit Number 19    Number of Visits 37    Date for PT Re-Evaluation 08/02/20    Authorization Type 9    Authorization Time Period of 10 progress report    PT Start Time 0947    PT Stop Time 1031    PT Time Calculation (min) 44 min    Activity Tolerance Patient tolerated treatment well    Behavior During Therapy [facility] for tasks assessed/performed           Past Medical History:  Diagnosis Date  . Anxiety   . Asthma    seasonal with allergies  . Colon polyps   . Degenerative arthritis   . Depression   . Diabetes mellitus without complication ([place])    Type II  . Environmental allergies   . Family history of adverse reaction to anesthesia    sister- PONV  . Fatty liver   . Glaucoma   . Headache(784.0)    HX  MIGRAINES  . History of bronchitis   . History of kidney stones   . History of pneumonia   . Hypertension   . Hypothyroidism   . Obesity   . OSA on CPAP   . Plantar fasciitis    right  . PONV (postoperative nausea and vomiting)   . Renal calculi   . Renal calculi   . RLS (restless legs syndrome) 08/23/2014    Past Surgical History:  Procedure Laterality Date  . ABDOMINAL HYSTERECTOMY     partial  . APPENDECTOMY    . Arthroscopic surgery, knee Left   . BREAST BIOPSY Right    several  . BREAST BIOPSY Right 03/11/2017   u/s bx neg  . BUNIONECTOMY     LEFT   . COLONOSCOPY W/ POLYPECTOMY    . COLONOSCOPY WITH PROPOFOL N/A 08/14/2017   Procedure: COLONOSCOPY WITH PROPOFOL;  Surgeon: Lollie Sails, MD;  Location: [facility] ENDOSCOPY;  Service: Endoscopy;  Laterality: N/A;  .  EXTRACORPOREAL SHOCK WAVE LITHOTRIPSY Left 05/14/2017   Procedure: EXTRACORPOREAL SHOCK WAVE LITHOTRIPSY (ESWL);  Surgeon: Abbie Sons, MD;  Location: [facility] ORS;  Service: Urology;  Laterality: Left;  . EXTRACORPOREAL SHOCK WAVE LITHOTRIPSY Left 06/14/2020   Procedure: EXTRACORPOREAL SHOCK WAVE LITHOTRIPSY (ESWL);  Surgeon: Billey Co, MD;  Location: [facility] ORS;  Service: Urology;  Laterality: Left;  . FOOT SURGERY     RIGHT    . KNEE ARTHROSCOPY W/ MENISCAL REPAIR Left   . LASIK    . MAXIMUM ACCESS (MAS)POSTERIOR LUMBAR INTERBODY FUSION (PLIF) 1 LEVEL N/A 04/25/2016   Procedure: LUMBAR FOUR-FIVE  MAXIMUM ACCESS (MAS) POSTERIOR LUMBAR INTERBODY FUSION (PLIF) with extension of instrumentation LUMBAR TWO-FIVE;  Surgeon: Eustace Moore, MD;  Location: [place];  Service: Neurosurgery;  Laterality: N/A;  . MAXIMUM ACCESS (MAS)POSTERIOR LUMBAR INTERBODY FUSION (PLIF) 2 LEVEL N/A 12/13/2015   Procedure: Lumbar two-three - Lumbar three-four MAXIMUM ACCESS (MAS) POSTERIOR LUMBAR INTERBODY FUSION (PLIF)  ;  Surgeon: Eustace Moore, MD;  Location: [facility] NEURO ORS;  Service: Neurosurgery;  Laterality: N/A;  . REVERSE SHOULDER ARTHROPLASTY Left 03/01/2020   Procedure: REVERSE SHOULDER ARTHROPLASTY;  Surgeon: Corky Mull, MD;  Location: [facility] ORS;  Service: Orthopedics;  Laterality: Left;  . SHOULDER SURGERY     RIGHT   . TONSILLECTOMY      There were no vitals filed for this visit.   Subjective Assessment - 06/26/20 0948    Subjective L shoulder feels a little sore like slight, at L anterior lateral shouler and arm.  Has not been able to do her HEP due to 2 days of bad migrain. Currently has a posterior headache. Locking up is getter better. 2/10 headache (posterior)    Pertinent History S/P L reverse total shoulder replacment. Did not know about only PROM for now. Had home health PT for 2 weeks which involved PROM shoulder abduction, flexion as well as isometric shoulder flexion and abduction.  Pt sleeps  on her bed.  Next surgeon appointment is either first or second week of December around Thurday of Friday.  Pt also states having ringing in her ears which started since the surgery.    Patient Stated Goals Raise her L arm better, be able to pick up things as well as work on crafts.    Currently in Pain? Yes    Pain Score 2    headache   Pain Onset More than a month ago                                     PT Education - 06/26/20 0955    Education provided Yes    Education Details ther-ex    [employer]) Educated Patient    Methods Explanation;Demonstration;Tactile cues;Verbal cues    Comprehension Returned demonstration;Verbalized understanding          Objective  No latex band allergies  03/27/2020: Blood pressure R arm sitting, mechanically taken, normal cuff: 170/80, HR 59  Last home health session was about over a week ago  L shoulder incisions: healing, scab formation with slight redness around the scabs.  16weeks post op   [employer] [facility]  Therapeutic exercise  seatedL shoulder A/AROMwith PT flexion 10x3 scaption 10x3 Abduction 10x3 ER 10x3  StandingB scapular retraction yellow band 10x3 with 5 second holds   Standing B shoulder ER yellow band 10x, then 5x  Standing bicep curls 2 lbs 10x3  L shoulder extension with scapular retraction yellow band 10x3  Standing L shoulder abduction 10x5 seconds challenging  Seated chin tucks 10x5 seconds   Standing B shoulder horizontal abduction 10x5 seconds for 3 sets.      Improved exercise technique, movement at target joints, use of target muscles after mod verbal, visual, tactile cues.   Manual therapy Seated STM posterior cervical praspinals  Slight decreased in neck pain.   Response to treatment Fair tolerance to today's session  Clinical impression Continued working on improving L  shoulder ROM to decrease stiffness as well as scapular and shoulder strengthening to improve function. Fair tolerance to today's session. Pt will benefit from continued skilled physical therapy services to decrease pain, improve AROM, strength and function       PT Short Term Goals - 05/07/20 1009      PT SHORT TERM GOAL #1   Title Pt will be independent with her initial HEP to improve ROM and ability to raise her arm with less difficulty when appropriate.    Baseline Pt has not been able to perform much of her HEP (05/07/2020)    Time 3  Period Weeks    Status On-going    Target Date 05/31/20             PT Long Term Goals - 06/21/20 0917      PT LONG TERM GOAL #1   Title Patient will improve L shoulder PROM flexion, and abduction AROM to at least 155 degrees, and ER to 80 degrees to promote ability to reach with less difficulty when appropriate.    Baseline PROM flexion 70 degrees, abduction 58 degrees, ER 5 degrees (scapular plane) 03/27/2020.; AAROM reclined: 125 degrees flexion, 95 degrees abduction, 22 degrees ER in scapular plane (05/07/20);L shoulder standing AROM: flexion 118 degrees, 115 degrees, ER (at about 90 degrees abduction): 43 degrees, IR (at about 90 degrees abduction): 60 degrees (06/21/2020)    Time 6    Period Weeks    Status Partially Met    Target Date 08/02/20      PT LONG TERM GOAL #2   Title Pt will have at least 145 degrees L shoulder flexion and abduction AROM, and 75 degrees ER AROM to promote ability to reach with less difficulty when appropriate.    Baseline AROM not yet performed (03/27/2020);AAROM reclined: 125 degrees flexion, 95 degrees abduction, 22 degrees ER in scapular plane (05/07/20);L shoulder standing AROM: flexion 118 degrees, 115 degrees, ER (at about 90 degrees abduction): 43 degrees, IR (at about 90 degrees abduction): 60 degrees    Time 6    Period Weeks    Status Partially Met    Target Date 08/02/20      PT LONG TERM GOAL #3   Title  PT will improve her FOTO score by at least 20 points as a demonstration of improved function.    Baseline Shoulder FOTO: 34 (03/27/2020); 50 (05/07/2020); 49 (06/07/2020)    Time 6    Period Weeks    Status Partially Met    Target Date 08/02/20      PT LONG TERM GOAL #4   Title Pt will have at least 4+/5 MMT grade L shoulder flexion, abduction, ER and IR to promote ability to perform functional tasks.    Baseline Manual resistance not yet performed. (05/07/2020); L shoulder flexion 4/5, abduction 4+/5, ER 4/5, IR 4+/5   (06/21/2020)    Time 6    Period Weeks    Status Partially Met    Target Date 08/02/20                 Plan - 06/26/20 0956    Clinical Impression Statement Continued working on improving L shoulder ROM to decrease stiffness as well as scapular and shoulder strengthening to improve function. Fair tolerance to today's session. Pt will benefit from continued skilled physical therapy services to decrease pain, improve AROM, strength and function    Personal Factors and Comorbidities Age;Comorbidity 3+;Fitness;Time since onset of injury/illness/exacerbation    Comorbidities anxiety, depression, DM, HTN    Examination-Activity Limitations Bathing;Hygiene/Grooming;Lift;Reach Overhead;Toileting;Carry    Stability/Clinical Decision Making Stable/Uncomplicated    Clinical Decision Making Low    Rehab Potential Fair    Clinical Impairments Affecting Rehab Potential Possible difficulty adhering to precautions. Pt needs constant reminder and cueing for PROM during the first few weeks of rehab.    PT Frequency 2x / week    PT Duration 6 weeks    PT Treatment/Interventions Therapeutic activities;Therapeutic exercise;Neuromuscular re-education;Patient/family education;Manual techniques;Passive range of motion;Dry needling;Vestibular;Canalith Repostioning;Electrical Stimulation;Iontophoresis 4mg /ml Dexamethasone    PT Next Visit Plan PROM, manual techniques, modalities PRN. Utilize  [facility] Reverse Total Shoulder Replacement Protocol unless otherwise noted by MD.    [place] Access Code [facility] 2    Consulted and Agree with Plan of Care Patient           Patient will benefit from skilled therapeutic intervention in order to improve the following deficits and impairments:  Pain,Postural dysfunction,Improper body mechanics,Decreased strength,Decreased range of motion  Visit Diagnosis: Stiffness of left shoulder, not elsewhere classified  Chronic left shoulder pain  Muscle weakness (generalized)     Problem List Patient Active Problem List   Diagnosis Date Noted  . 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 edema associated with type 2 diabetes mellitus ([place]) 01/03/2019  . Chronic cough 11/19/2016  . Morbid obesity ([place]) 09/26/2016  . Left ovarian cyst 05/30/2016  . S/P lumbar spinal fusion 12/13/2015  . Aortic calcification ([place]) 07/18/2015  . Controlled type 2 diabetes mellitus without complication ([place]) 27/74/1287  . Thrombocytopenia ([place]) 05/16/2015  . Recurrent major depressive disorder, in full remission ([place]) 05/16/2015  . Essential (primary) hypertension 05/16/2015  . Fatty infiltration of liver 03/15/2015  . Aortic valve stenosis, nonrheumatic 01/18/2015  . Sciatica of right side 01/09/2015  . Type 2 diabetes mellitus ([place]) 11/10/2014  . RLS (restless legs syndrome) 08/23/2014  . Neuritis or radiculitis due to rupture of lumbar intervertebral disc 06/13/2014  . Degeneration of intervertebral disc of lumbar region 06/13/2014  . Lumbar radiculitis 06/13/2014  . Arthritis 01/23/2014  . Arthritis of knee, degenerative 11/15/2013  . Depression 09/29/2013  . Insomnia 09/29/2013  . Apnea, sleep 08/29/2013  . History of nephrolithiasis 08/29/2013  . Abnormal presence of protein in urine 08/29/2013  . Headache, migraine 08/29/2013  . BP (high blood pressure) 08/29/2013  .  HLD (hyperlipidemia) 08/29/2013  . Allergic rhinitis 08/29/2013  . Intractable migraine without aura 02/18/2013    Joneen Boers PT, DPT   06/26/2020, 7:00 PM  [facility] [address] [place], [address] Phone: [phone]   Fax:  [phone]  Name: Jessica Cole MRN: [id number] Date of Birth: 1950-11-16

## 2020-06-27 ENCOUNTER — Other Ambulatory Visit: Payer: Self-pay | Admitting: Physician Assistant

## 2020-06-27 DIAGNOSIS — N200 Calculus of kidney: Secondary | ICD-10-CM

## 2020-06-28 ENCOUNTER — Other Ambulatory Visit: Payer: Self-pay

## 2020-06-28 ENCOUNTER — Ambulatory Visit: Payer: Medicare HMO

## 2020-06-28 DIAGNOSIS — M25612 Stiffness of left shoulder, not elsewhere classified: Secondary | ICD-10-CM

## 2020-06-28 DIAGNOSIS — G8929 Other chronic pain: Secondary | ICD-10-CM

## 2020-06-28 DIAGNOSIS — M6281 Muscle weakness (generalized): Secondary | ICD-10-CM

## 2020-06-28 NOTE — Therapy (Signed)
[facility] [address] [place], [address] Phone: [phone]   Fax:  [phone]  Physical Therapy Treatment And Progress Report (05/08/2019 - 06/28/2020)  Patient Details  Name: Jessica Cole MRN: [id number] Date of Birth: [birth year] Referring Provider (PT): Lattie Corns, [place]   Encounter Date: 06/28/2020   PT End of Session - 06/28/20 0851    Visit Number 20    Number of Visits 37    Date for PT Re-Evaluation 08/02/20    Authorization Type 10    Authorization Time Period of 10 progress report    PT Start Time 0852    PT Stop Time 0942    PT Time Calculation (min) 50 min    Activity Tolerance Patient tolerated treatment well    Behavior During Therapy [facility] for tasks assessed/performed           Past Medical History:  Diagnosis Date  . Anxiety   . Asthma    seasonal with allergies  . Colon polyps   . Degenerative arthritis   . Depression   . Diabetes mellitus without complication ([place])    Type II  . Environmental allergies   . Family history of adverse reaction to anesthesia    sister- PONV  . Fatty liver   . Glaucoma   . Headache(784.0)    HX  MIGRAINES  . History of bronchitis   . History of kidney stones   . History of pneumonia   . Hypertension   . Hypothyroidism   . Obesity   . OSA on CPAP   . Plantar fasciitis    right  . PONV (postoperative nausea and vomiting)   . Renal calculi   . Renal calculi   . RLS (restless legs syndrome) 08/23/2014    Past Surgical History:  Procedure Laterality Date  . ABDOMINAL HYSTERECTOMY     partial  . APPENDECTOMY    . Arthroscopic surgery, knee Left   . BREAST BIOPSY Right    several  . BREAST BIOPSY Right 03/11/2017   u/s bx neg  . BUNIONECTOMY     LEFT   . COLONOSCOPY W/ POLYPECTOMY    . COLONOSCOPY WITH PROPOFOL N/A 08/14/2017   Procedure: COLONOSCOPY WITH PROPOFOL;  Surgeon: Lollie Sails, MD;  Location: [facility]  ENDOSCOPY;  Service: Endoscopy;  Laterality: N/A;  . EXTRACORPOREAL SHOCK WAVE LITHOTRIPSY Left 05/14/2017   Procedure: EXTRACORPOREAL SHOCK WAVE LITHOTRIPSY (ESWL);  Surgeon: Abbie Sons, MD;  Location: [facility] ORS;  Service: Urology;  Laterality: Left;  . EXTRACORPOREAL SHOCK WAVE LITHOTRIPSY Left 06/14/2020   Procedure: EXTRACORPOREAL SHOCK WAVE LITHOTRIPSY (ESWL);  Surgeon: Billey Co, MD;  Location: [facility] ORS;  Service: Urology;  Laterality: Left;  . FOOT SURGERY     RIGHT    . KNEE ARTHROSCOPY W/ MENISCAL REPAIR Left   . LASIK    . MAXIMUM ACCESS (MAS)POSTERIOR LUMBAR INTERBODY FUSION (PLIF) 1 LEVEL N/A 04/25/2016   Procedure: LUMBAR FOUR-FIVE  MAXIMUM ACCESS (MAS) POSTERIOR LUMBAR INTERBODY FUSION (PLIF) with extension of instrumentation LUMBAR TWO-FIVE;  Surgeon: Eustace Moore, MD;  Location: [place];  Service: Neurosurgery;  Laterality: N/A;  . MAXIMUM ACCESS (MAS)POSTERIOR LUMBAR INTERBODY FUSION (PLIF) 2 LEVEL N/A 12/13/2015   Procedure: Lumbar two-three - Lumbar three-four MAXIMUM ACCESS (MAS) POSTERIOR LUMBAR INTERBODY FUSION (PLIF)  ;  Surgeon: Eustace Moore, MD;  Location: [facility] NEURO ORS;  Service: Neurosurgery;  Laterality: N/A;  . REVERSE SHOULDER ARTHROPLASTY Left 03/01/2020  Procedure: REVERSE SHOULDER ARTHROPLASTY;  Surgeon: Corky Mull, MD;  Location: [facility] ORS;  Service: Orthopedics;  Laterality: Left;  . SHOULDER SURGERY     RIGHT   . TONSILLECTOMY      There were no vitals filed for this visit.   Subjective Assessment - 06/28/20 0853    Subjective Whole body has been hurting. L shoulder was hurting yesterday. The rest of her body has been hurting too. Whole body started hurting yesterday. Hurting in the R low back again. Has not been doing her HEP.    Pertinent History S/P L reverse total shoulder replacment. Did not know about only PROM for now. Had home health PT for 2 weeks which involved PROM shoulder abduction, flexion as well as isometric shoulder flexion and  abduction.  Pt sleeps on her bed.  Next surgeon appointment is either first or second week of December around Thurday of Friday.  Pt also states having ringing in her ears which started since the surgery.    Patient Stated Goals Raise her L arm better, be able to pick up things as well as work on crafts.    Currently in Pain? Yes    Pain Score 4     Pain Location Shoulder    Pain Orientation Left    Pain Onset More than a month ago                                     PT Education - 06/28/20 0904    Education provided Yes    Education Details ther-ex    [employer]) Educated Patient    Methods Explanation;Demonstration;Tactile cues;Verbal cues    Comprehension Returned demonstration;Verbalized understanding           Objective  No latex band allergies  03/27/2020: Blood pressure R arm sitting, mechanically taken, normal cuff: 170/80, HR 59  Last home health session was about over a week ago  L shoulder incisions: healing, scab formation with slight redness around the scabs.  16weeks post op   [employer] [facility]  Therapeutic exercise  Seated trunk flexion: centralized R low back symptoms to central low back.   Standing L shoulder AROM at start of session  Flexion 116 degrees  Abduction 108 degrees  seatedL shoulder A/AROMwith PT flexion 10x3 scaption 10x3 Abduction 10x3 ER 10x3  Seated manually resisted shoulder  Return from flexion 10x3  returng from scaption 10x3  Return from abduction 10x3  Seated L triceps extension isometrics with PT manual resistance, Elbow at 90 degrees flexion 10x3 with 5 second holds   Standing L shoulder AROM after treatment  Flexion 118 degrees    Improved exercise technique, movement at target joints, use of target muscles after mod verbal, visual, tactile cues.    Manual therapy  Seated STM L distal pectoralis muscle  to decrease tension  Seated STM L upper trap muscle to decrease tension      Response to treatment Fair tolerance to today's session  Clinical impression Pt demnonstrates overall improved L shoulder strength (4 to 4+/5), as well as improved overall ability to raise her L arm up against gravity. AROM however decreases when pt does not perform her home exercises. Stiff end feel. Still demonstrates some locking sensation at times. Pt will benefit from continued skilled physical therapy services to decrease stiffness, improve AROM, strength, and function.      PT Short Term Goals - 06/28/20  0913      PT SHORT TERM GOAL #1   Title Pt will be independent with her initial HEP to improve ROM and ability to raise her arm with less difficulty when appropriate.    Baseline Pt has not been able to perform much of her HEP (05/07/2020); Has not been performing her HEP (06/28/2020)    Time 3    Period Weeks    Status On-going    Target Date 07/19/20             PT Long Term Goals - 06/28/20 0932      PT LONG TERM GOAL #1   Title Patient will improve L shoulder PROM flexion, and abduction AROM to at least 155 degrees, and ER to 80 degrees to promote ability to reach with less difficulty when appropriate.    Baseline PROM flexion 70 degrees, abduction 58 degrees, ER 5 degrees (scapular plane) 03/27/2020.; AAROM reclined: 125 degrees flexion, 95 degrees abduction, 22 degrees ER in scapular plane (05/07/20);L shoulder standing AROM: flexion 118 degrees, 115 degrees, ER (at about 90 degrees abduction): 43 degrees, IR (at about 90 degrees abduction): 60 degrees (06/21/2020)    Time 6    Period Weeks    Status Partially Met    Target Date 08/02/20      PT LONG TERM GOAL #2   Title Pt will have at least 145 degrees L shoulder flexion and abduction AROM, and 75 degrees ER AROM to promote ability to reach with less difficulty when appropriate.    Baseline AROM not yet performed (03/27/2020);AAROM  reclined: 125 degrees flexion, 95 degrees abduction, 22 degrees ER in scapular plane (05/07/20);L shoulder standing AROM: flexion 118 degrees, 115 degrees, ER (at about 90 degrees abduction): 43 degrees, IR (at about 90 degrees abduction): 60 degrees    Time 6    Period Weeks    Status Partially Met    Target Date 08/02/20      PT LONG TERM GOAL #3   Title PT will improve her FOTO score by at least 20 points as a demonstration of improved function.    Baseline Shoulder FOTO: 34 (03/27/2020); 50 (05/07/2020); 49 (06/07/2020)    Time 6    Period Weeks    Status Partially Met      PT LONG TERM GOAL #4   Title Pt will have at least 4+/5 MMT grade L shoulder flexion, abduction, ER and IR to promote ability to perform functional tasks.    Baseline Manual resistance not yet performed. (05/07/2020); L shoulder flexion 4/5, abduction 4+/5, ER 4/5, IR 4+/5   (06/21/2020)    Time 6    Period Weeks    Status Partially Met    Target Date 08/02/20                 Plan - 06/28/20 0847    Clinical Impression Statement Pt demnonstrates overall improved L shoulder strength (4 to 4+/5), as well as improved overall ability to raise her L arm up against gravity. AROM however decreases when pt does not perform her home exercises. Stiff end feel. Still demonstrates some locking sensation at times. Pt will benefit from continued skilled physical therapy services to decrease stiffness, improve AROM, strength, and function.    Personal Factors and Comorbidities Age;Comorbidity 3+;Fitness;Time since onset of injury/illness/exacerbation    Comorbidities anxiety, depression, DM, HTN    Examination-Activity Limitations Bathing;Hygiene/Grooming;Lift;Reach Overhead;Toileting;Carry    Stability/Clinical Decision Making Stable/Uncomplicated    Rehab Potential Fair  Clinical Impairments Affecting Rehab Potential Possible difficulty adhering to precautions. Pt needs constant reminder and cueing for PROM during the first  few weeks of rehab.    PT Frequency 2x / week    PT Duration 6 weeks    PT Treatment/Interventions Therapeutic activities;Therapeutic exercise;Neuromuscular re-education;Patient/family education;Manual techniques;Passive range of motion;Dry needling;Vestibular;Canalith Repostioning;Electrical Stimulation;Iontophoresis 4mg /ml Dexamethasone    PT Next Visit Plan PROM, manual techniques, modalities PRN. [facility] Reverse Total Shoulder Replacement Protocol unless otherwise noted by MD.    [place] Access Code [facility]    Consulted and Agree with Plan of Care Patient           Patient will benefit from skilled therapeutic intervention in order to improve the following deficits and impairments:  Pain,Postural dysfunction,Improper body mechanics,Decreased strength,Decreased range of motion  Visit Diagnosis: Stiffness of left shoulder, not elsewhere classified  Chronic left shoulder pain  Muscle weakness (generalized)     Problem List Patient Active Problem List   Diagnosis Date Noted  . 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 edema associated with type 2 diabetes mellitus ([place]) 01/03/2019  . Chronic cough 11/19/2016  . Morbid obesity ([place]) 09/26/2016  . Left ovarian cyst 05/30/2016  . S/P lumbar spinal fusion 12/13/2015  . Aortic calcification ([place]) 07/18/2015  . Controlled type 2 diabetes mellitus without complication ([place]) 24/49/7530  . Thrombocytopenia ([place]) 05/16/2015  . Recurrent major depressive disorder, in full remission ([place]) 05/16/2015  . Essential (primary) hypertension 05/16/2015  . Fatty infiltration of liver 03/15/2015  . Aortic valve stenosis, nonrheumatic 01/18/2015  . Sciatica of right side 01/09/2015  . Type 2 diabetes mellitus ([place]) 11/10/2014  . RLS (restless legs syndrome) 08/23/2014  . Neuritis or radiculitis due to rupture of lumbar intervertebral disc 06/13/2014  .  Degeneration of intervertebral disc of lumbar region 06/13/2014  . Lumbar radiculitis 06/13/2014  . Arthritis 01/23/2014  . Arthritis of knee, degenerative 11/15/2013  . Depression 09/29/2013  . Insomnia 09/29/2013  . Apnea, sleep 08/29/2013  . History of nephrolithiasis 08/29/2013  . Abnormal presence of protein in urine 08/29/2013  . Headache, migraine 08/29/2013  . BP (high blood pressure) 08/29/2013  . HLD (hyperlipidemia) 08/29/2013  . Allergic rhinitis 08/29/2013  . Intractable migraine without aura 02/18/2013   Thank you for your referral.  Joneen Boers PT, DPT   06/28/2020, 12:17 PM  [facility] [address] [place], [address] Phone: [phone]   Fax:  [phone]  Name: Jessica Cole MRN: [id number] Date of Birth: 07-05-1950

## 2020-06-29 ENCOUNTER — Other Ambulatory Visit: Payer: Self-pay

## 2020-06-29 ENCOUNTER — Ambulatory Visit
Admission: RE | Admit: 2020-06-29 | Discharge: 2020-06-29 | Disposition: A | Discharge status: Home / Self Care | Payer: Medicare HMO | Source: Ambulatory Visit | Attending: Physician Assistant | Admitting: Physician Assistant

## 2020-06-29 ENCOUNTER — Ambulatory Visit: Payer: Medicare HMO | Admitting: Physician Assistant

## 2020-06-29 ENCOUNTER — Ambulatory Visit
Admission: RE | Admit: 2020-06-29 | Discharge: 2020-06-29 | Disposition: A | Discharge status: Home / Self Care | Payer: Medicare HMO | Attending: Physician Assistant | Admitting: Physician Assistant

## 2020-06-29 DIAGNOSIS — N200 Calculus of kidney: Secondary | ICD-10-CM | POA: Diagnosis present

## 2020-06-29 LAB — MICROSCOPIC EXAMINATION: Epithelial Cells (non renal): >10 /hpf — AB (ref 0–10)

## 2020-06-29 LAB — URINALYSIS, COMPLETE
Bilirubin, UA: NEGATIVE
Glucose, UA: NEGATIVE
Ketones, UA: NEGATIVE
Nitrite, UA: NEGATIVE
RBC, UA: NEGATIVE
Specific Gravity, UA: 1.025 (ref 1.005–1.030)
Urobilinogen, Ur: 0.2 mg/dL (ref 0.2–1.0)
pH, UA: 5 (ref 5.0–7.5)

## 2020-07-03 ENCOUNTER — Ambulatory Visit: Payer: Medicare HMO | Attending: Family Medicine

## 2020-07-03 ENCOUNTER — Other Ambulatory Visit: Payer: Self-pay

## 2020-07-03 DIAGNOSIS — M25512 Pain in left shoulder: Secondary | ICD-10-CM | POA: Diagnosis present

## 2020-07-03 DIAGNOSIS — G8929 Other chronic pain: Secondary | ICD-10-CM | POA: Diagnosis present

## 2020-07-03 DIAGNOSIS — M6281 Muscle weakness (generalized): Secondary | ICD-10-CM | POA: Insufficient documentation

## 2020-07-03 DIAGNOSIS — M25612 Stiffness of left shoulder, not elsewhere classified: Secondary | ICD-10-CM | POA: Insufficient documentation

## 2020-07-03 NOTE — Therapy (Signed)
[facility] [address] [place], [address] Phone: [phone]   Fax:  [phone]  Physical Therapy Treatment  Patient Details  Name: Jessica Cole MRN: [id number] Date of Birth: [birth year] Referring Provider (PT): Lattie Corns, [place]   Encounter Date: 07/03/2020   PT End of Session - 07/03/20 0949    Visit Number 21    Number of Visits 37    Date for PT Re-Evaluation 08/02/20    Authorization Type 1    Authorization Time Period of 10 progress report    PT Start Time 0949    PT Stop Time 1029    PT Time Calculation (min) 40 min    Activity Tolerance Patient tolerated treatment well    Behavior During Therapy [facility] for tasks assessed/performed           Past Medical History:  Diagnosis Date  . Anxiety   . Asthma    seasonal with allergies  . Colon polyps   . Degenerative arthritis   . Depression   . Diabetes mellitus without complication ([place])    Type II  . Environmental allergies   . Family history of adverse reaction to anesthesia    sister- PONV  . Fatty liver   . Glaucoma   . Headache(784.0)    HX  MIGRAINES  . History of bronchitis   . History of kidney stones   . History of pneumonia   . Hypertension   . Hypothyroidism   . Obesity   . OSA on CPAP   . Plantar fasciitis    right  . PONV (postoperative nausea and vomiting)   . Renal calculi   . Renal calculi   . RLS (restless legs syndrome) 08/23/2014    Past Surgical History:  Procedure Laterality Date  . ABDOMINAL HYSTERECTOMY     partial  . APPENDECTOMY    . Arthroscopic surgery, knee Left   . BREAST BIOPSY Right    several  . BREAST BIOPSY Right 03/11/2017   u/s bx neg  . BUNIONECTOMY     LEFT   . COLONOSCOPY W/ POLYPECTOMY    . COLONOSCOPY WITH PROPOFOL N/A 08/14/2017   Procedure: COLONOSCOPY WITH PROPOFOL;  Surgeon: Lollie Sails, MD;  Location: [facility] ENDOSCOPY;  Service: Endoscopy;  Laterality: N/A;  .  EXTRACORPOREAL SHOCK WAVE LITHOTRIPSY Left 05/14/2017   Procedure: EXTRACORPOREAL SHOCK WAVE LITHOTRIPSY (ESWL);  Surgeon: Abbie Sons, MD;  Location: [facility] ORS;  Service: Urology;  Laterality: Left;  . EXTRACORPOREAL SHOCK WAVE LITHOTRIPSY Left 06/14/2020   Procedure: EXTRACORPOREAL SHOCK WAVE LITHOTRIPSY (ESWL);  Surgeon: Billey Co, MD;  Location: [facility] ORS;  Service: Urology;  Laterality: Left;  . FOOT SURGERY     RIGHT    . KNEE ARTHROSCOPY W/ MENISCAL REPAIR Left   . LASIK    . MAXIMUM ACCESS (MAS)POSTERIOR LUMBAR INTERBODY FUSION (PLIF) 1 LEVEL N/A 04/25/2016   Procedure: LUMBAR FOUR-FIVE  MAXIMUM ACCESS (MAS) POSTERIOR LUMBAR INTERBODY FUSION (PLIF) with extension of instrumentation LUMBAR TWO-FIVE;  Surgeon: Eustace Moore, MD;  Location: [facility];  Service: Neurosurgery;  Laterality: N/A;  . MAXIMUM ACCESS (MAS)POSTERIOR LUMBAR INTERBODY FUSION (PLIF) 2 LEVEL N/A 12/13/2015   Procedure: Lumbar two-three - Lumbar three-four MAXIMUM ACCESS (MAS) POSTERIOR LUMBAR INTERBODY FUSION (PLIF)  ;  Surgeon: Eustace Moore, MD;  Location: [facility] NEURO ORS;  Service: Neurosurgery;  Laterality: N/A;  . REVERSE SHOULDER ARTHROPLASTY Left 03/01/2020   Procedure: REVERSE SHOULDER ARTHROPLASTY;  Surgeon: Corky Mull, MD;  Location: [facility] ORS;  Service: Orthopedics;  Laterality: Left;  . SHOULDER SURGERY     RIGHT   . TONSILLECTOMY      There were no vitals filed for this visit.   Subjective Assessment - 07/03/20 0950    Subjective Did some of her home exercises. More soreness like L shoulder instead of pain from moving it. Did not have locking up since last session.    Pertinent History S/P L reverse total shoulder replacment. Did not know about only PROM for now. Had home health PT for 2 weeks which involved PROM shoulder abduction, flexion as well as isometric shoulder flexion and abduction.  Pt sleeps on her bed.  Next surgeon appointment is either first or second week of December around Thurday of  Friday.  Pt also states having ringing in her ears which started since the surgery.    Patient Stated Goals Raise her L arm better, be able to pick up things as well as work on crafts.    Currently in Pain? No/denies    Pain Onset More than a month ago                                     PT Education - 07/03/20 1005    Education provided Yes    Education Details ther-ex    [employer]) Educated Patient    Methods Explanation;Demonstration;Tactile cues;Verbal cues    Comprehension Returned demonstration;Verbalized understanding          Objective  No latex band allergies  03/27/2020: Blood pressure R arm sitting, mechanically taken, normal cuff: 170/80, HR 59  Last home health session was about over a week ago  L shoulder incisions: healing, scab formation with slight redness around the scabs.  16weeks post op   MedbridgeAccess Code [facility]  Therapeutic exercise  seatedL shoulder A/AROMwith PT flexion 10x3 scaption 10x3 Abduction 10x3 ER 10x3  Seated manually resisted shoulder             Return from flexion 10x3             returng from scaption 10x3             Return from abduction 10x3  Seated L triceps extension isometrics with PT manual resistance, Elbow at 90 degrees flexion 10x3 with 5 second holds   Seated manually resisted L scapular retraction targeing lower trap muscles 10x3 with 5 second holds   Seated B shoulder horizontal abduction 10x5 seconds for 3 sets.  Seated L shoulder flexion AROM 10x5 seconds   Seated  L shoulder abduction 10x5 seconds challenging   Seated chin tucks 10x5 seconds for 2 sets    Improved exercise technique, movement at target joints, use of target muscles after mod verbal, visual, tactile cues.     Response to treatment Pt tolerated session well without aggravation of symptoms  Clinical impression   Continued working on AAROM to improve range, as well as continued working on posterior shoulder muscles to balance anterior muscle strength. Continued working on scapular strength as well to promote glenohumeral mechanics. Pt tolerated session well without aggravation of symptoms. Decreased overall locking sensation reported by pt. Pt will benefit from continued skilled physical therapy services to improve ROM, strength and function.          PT Short Term Goals - 06/28/20 0913      PT SHORT  TERM GOAL #1   Title Pt will be independent with her initial HEP to improve ROM and ability to raise her arm with less difficulty when appropriate.    Baseline Pt has not been able to perform much of her HEP (05/07/2020); Has not been performing her HEP (06/28/2020)    Time 3    Period Weeks    Status On-going    Target Date 07/19/20             PT Long Term Goals - 06/28/20 0932      PT LONG TERM GOAL #1   Title Patient will improve L shoulder PROM flexion, and abduction AROM to at least 155 degrees, and ER to 80 degrees to promote ability to reach with less difficulty when appropriate.    Baseline PROM flexion 70 degrees, abduction 58 degrees, ER 5 degrees (scapular plane) 03/27/2020.; AAROM reclined: 125 degrees flexion, 95 degrees abduction, 22 degrees ER in scapular plane (05/07/20);L shoulder standing AROM: flexion 118 degrees, 115 degrees, ER (at about 90 degrees abduction): 43 degrees, IR (at about 90 degrees abduction): 60 degrees (06/21/2020)    Time 6    Period Weeks    Status Partially Met    Target Date 08/02/20      PT LONG TERM GOAL #2   Title Pt will have at least 145 degrees L shoulder flexion and abduction AROM, and 75 degrees ER AROM to promote ability to reach with less difficulty when appropriate.    Baseline AROM not yet performed (03/27/2020);AAROM reclined: 125 degrees flexion, 95 degrees abduction, 22 degrees ER in scapular plane (05/07/20);L shoulder standing AROM: flexion  118 degrees, 115 degrees, ER (at about 90 degrees abduction): 43 degrees, IR (at about 90 degrees abduction): 60 degrees    Time 6    Period Weeks    Status Partially Met    Target Date 08/02/20      PT LONG TERM GOAL #3   Title PT will improve her FOTO score by at least 20 points as a demonstration of improved function.    Baseline Shoulder FOTO: 34 (03/27/2020); 50 (05/07/2020); 49 (06/07/2020)    Time 6    Period Weeks    Status Partially Met      PT LONG TERM GOAL #4   Title Pt will have at least 4+/5 MMT grade L shoulder flexion, abduction, ER and IR to promote ability to perform functional tasks.    Baseline Manual resistance not yet performed. (05/07/2020); L shoulder flexion 4/5, abduction 4+/5, ER 4/5, IR 4+/5   (06/21/2020)    Time 6    Period Weeks    Status Partially Met    Target Date 08/02/20                 Plan - 07/03/20 1007    Clinical Impression Statement Continued working on AAROM to improve range, as well as continued working on posterior shoulder muscles to balance anterior muscle strength. Continued working on scapular strength as well to promote glenohumeral mechanics. Pt tolerated session well without aggravation of symptoms. Decreased overall locking sensation reported by pt. Pt will benefit from continued skilled physical therapy services to improve ROM, strength and function.    Personal Factors and Comorbidities Age;Comorbidity 3+;Fitness;Time since onset of injury/illness/exacerbation    Comorbidities anxiety, depression, DM, HTN    Examination-Activity Limitations Bathing;Hygiene/Grooming;Lift;Reach Overhead;Toileting;Carry    Stability/Clinical Decision Making Stable/Uncomplicated    Clinical Decision Making Low    Rehab Potential Fair  Clinical Impairments Affecting Rehab Potential Possible difficulty adhering to precautions. Pt needs constant reminder and cueing for PROM during the first few weeks of rehab.    PT Frequency 2x / week    PT Duration  6 weeks    PT Treatment/Interventions Therapeutic activities;Therapeutic exercise;Neuromuscular re-education;Patient/family education;Manual techniques;Passive range of motion;Dry needling;Vestibular;Canalith Repostioning;Electrical Stimulation;Iontophoresis 2m/ml Dexamethasone    PT Next Visit Plan PROM, manual techniques, modalities PRN. [facility] SystemReverse Total Shoulder Replacement Protocol unless otherwise noted by MD.    PEuclidAccess Code [facility]   Consulted and Agree with Plan of Care Patient           Patient will benefit from skilled therapeutic intervention in order to improve the following deficits and impairments:  Pain,Postural dysfunction,Improper body mechanics,Decreased strength,Decreased range of motion  Visit Diagnosis: Stiffness of left shoulder, not elsewhere classified  Chronic left shoulder pain  Muscle weakness (generalized)     Problem List Patient Active Problem List   Diagnosis Date Noted  . 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 edema associated with type 2 diabetes mellitus ([facility] 01/03/2019  . Chronic cough 11/19/2016  . Morbid obesity ([place] 09/26/2016  . Left ovarian cyst 05/30/2016  . S/P lumbar spinal fusion 12/13/2015  . Aortic calcification ([place] 07/18/2015  . Controlled type 2 diabetes mellitus without complication ([facility] 098/06/2177 . Thrombocytopenia ([facility] 05/16/2015  . Recurrent major depressive disorder, in full remission ([place] 05/16/2015  . Essential (primary) hypertension 05/16/2015  . Fatty infiltration of liver 03/15/2015  . Aortic valve stenosis, nonrheumatic 01/18/2015  . Sciatica of right side 01/09/2015  . Type 2 diabetes mellitus ([facility] [place] 11/10/2014  . RLS (restless legs syndrome) 08/23/2014  . Neuritis or radiculitis due to rupture of lumbar intervertebral disc 06/13/2014  . Degeneration of intervertebral disc of lumbar region 06/13/2014  .  Lumbar radiculitis 06/13/2014  . Arthritis 01/23/2014  . Arthritis of knee, degenerative 11/15/2013  . Depression 09/29/2013  . Insomnia 09/29/2013  . Apnea, sleep 08/29/2013  . History of nephrolithiasis 08/29/2013  . Abnormal presence of protein in urine 08/29/2013  . Headache, migraine 08/29/2013  . BP (high blood pressure) 08/29/2013  . HLD (hyperlipidemia) 08/29/2013  . Allergic rhinitis 08/29/2013  . Intractable migraine without aura 02/18/2013   MJoneen BoersPT, DPT  07/03/2020, 11:43 AM  [facility] [address] [place] [address]Phone: [phone]  Fax:  [phone] Name: Jessica COCKERMRN: [id number] of Birth: [birth year]

## 2020-07-04 ENCOUNTER — Other Ambulatory Visit: Payer: Self-pay

## 2020-07-04 ENCOUNTER — Ambulatory Visit: Payer: Medicare HMO

## 2020-07-04 DIAGNOSIS — M25612 Stiffness of left shoulder, not elsewhere classified: Secondary | ICD-10-CM | POA: Diagnosis not present

## 2020-07-04 DIAGNOSIS — M25512 Pain in left shoulder: Secondary | ICD-10-CM

## 2020-07-04 DIAGNOSIS — G8929 Other chronic pain: Secondary | ICD-10-CM

## 2020-07-04 DIAGNOSIS — M6281 Muscle weakness (generalized): Secondary | ICD-10-CM

## 2020-07-04 NOTE — Therapy (Signed)
[facility] [address], [place], [address] Phone: [phone]   Fax:  [phone]  Physical Therapy Treatment  Patient Details  Name: Jessica Cole MRN: [id number] Date of Birth: 1951/04/12 Referring Provider (PT): Lattie Corns, [place]   Encounter Date: 07/04/2020   PT End of Session - 07/04/20 1301    Visit Number 22    Number of Visits 37    Date for PT Re-Evaluation 08/02/20    Authorization Type 2    Authorization Time Period of 10 progress report    PT Start Time 1301    PT Stop Time 1342    PT Time Calculation (min) 41 min    Activity Tolerance Patient tolerated treatment well    Behavior During Therapy [facility] for tasks assessed/performed           Past Medical History:  Diagnosis Date  . Anxiety   . Asthma    seasonal with allergies  . Colon polyps   . Degenerative arthritis   . Depression   . Diabetes mellitus without complication ([place])    Type II  . Environmental allergies   . Family history of adverse reaction to anesthesia    sister- PONV  . Fatty liver   . Glaucoma   . Headache(784.0)    HX  MIGRAINES  . History of bronchitis   . History of kidney stones   . History of pneumonia   . Hypertension   . Hypothyroidism   . Obesity   . OSA on CPAP   . Plantar fasciitis    right  . PONV (postoperative nausea and vomiting)   . Renal calculi   . Renal calculi   . RLS (restless legs syndrome) 08/23/2014    Past Surgical History:  Procedure Laterality Date  . ABDOMINAL HYSTERECTOMY     partial  . APPENDECTOMY    . Arthroscopic surgery, knee Left   . BREAST BIOPSY Right    several  . BREAST BIOPSY Right 03/11/2017   u/s bx neg  . BUNIONECTOMY     LEFT   . COLONOSCOPY W/ POLYPECTOMY    . COLONOSCOPY WITH PROPOFOL N/A 08/14/2017   Procedure: COLONOSCOPY WITH PROPOFOL;  Surgeon: Lollie Sails, MD;  Location[facility] ENDOSCOPY;  Service: Endoscopy;  Laterality: N/A;  .  EXTRACORPOREAL SHOCK WAVE LITHOTRIPSY Left 05/14/2017   Procedure: EXTRACORPOREAL SHOCK WAVE LITHOTRIPSY (ESWL);  Surgeon: Abbie Sons, MD;  Location: [facility] ORS;  Service: Urology;  Laterality: Left;  . EXTRACORPOREAL SHOCK WAVE LITHOTRIPSY Left 06/14/2020   Procedure: EXTRACORPOREAL SHOCK WAVE LITHOTRIPSY (ESWL);  Surgeon: Billey Co, MD;  Location: [facility] ORS;  Service: Urology;  Laterality: Left;  . FOOT SURGERY     RIGHT    . KNEE ARTHROSCOPY W/ MENISCAL REPAIR Left   . LASIK    . MAXIMUM ACCESS (MAS)POSTERIOR LUMBAR INTERBODY FUSION (PLIF) 1 LEVEL N/A 04/25/2016   Procedure: LUMBAR FOUR-FIVE  MAXIMUM ACCESS (MAS) POSTERIOR LUMBAR INTERBODY FUSION (PLIF) with extension of instrumentation LUMBAR TWO-FIVE;  Surgeon: Eustace Moore, MD;  Location: [facility];  Service: Neurosurgery;  Laterality: N/A;  . MAXIMUM ACCESS (MAS)POSTERIOR LUMBAR INTERBODY FUSION (PLIF) 2 LEVEL N/A 12/13/2015   Procedure: Lumbar two-three - Lumbar three-four MAXIMUM ACCESS (MAS) POSTERIOR LUMBAR INTERBODY FUSION (PLIF)  ;  Surgeon: Eustace Moore, MD;  Location: [facility] NEURO ORS;  Service: Neurosurgery;  Laterality: N/A;  . REVERSE SHOULDER ARTHROPLASTY Left 03/01/2020   Procedure: REVERSE SHOULDER ARTHROPLASTY;  Surgeon: Corky Mull, MD;  Location: [facility] ORS;  Service: Orthopedics;  Laterality: Left;  . SHOULDER SURGERY     RIGHT   . TONSILLECTOMY      There were no vitals filed for this visit.   Subjective Assessment - 07/04/20 1302    Subjective L shoulder is sore/slightly hurting. 2/10 currently. Not bad. Had only one instance of slight locking up of shoulder. Frequency of locking up is less.    Pertinent History S/P L reverse total shoulder replacment. Did not know about only PROM for now. Had home health PT for 2 weeks which involved PROM shoulder abduction, flexion as well as isometric shoulder flexion and abduction.  Pt sleeps on her bed.  Next surgeon appointment is either first or second week of December around  Thurday of Friday.  Pt also states having ringing in her ears which started since the surgery.    Patient Stated Goals Raise her L arm better, be able to pick up things as well as work on crafts.    Currently in Pain? Yes    Pain Score 2     Pain Onset More than a month ago                                     PT Education - 07/04/20 1318    Education provided Yes    Education Details ther-ex    [employer]) Educated Patient    Methods Explanation;Demonstration;Tactile cues;Verbal cues    Comprehension Returned demonstration;Verbalized understanding           Objective  No latex band allergies  03/27/2020: Blood pressure R arm sitting, mechanically taken, normal cuff: 170/80, HR 59  Last home health session was about over a week ago  L shoulder incisions: healing, scab formation with slight redness around the scabs.  16weeks post op   MedbridgeAccess Code [facility]  Therapeutic exercise  seatedL shoulder A/AROMwith PT flexion 10x3 scaption 10x3 Abduction 10x3 ER 10x3   Seated manually resisted L scapular retraction targeing lower trap muscles 10x3 with 5 second holds   Seated L triceps extension isometrics with PT manual resistance, Elbow at 90 degrees flexion 10x3 with 5 second holds   Seated manually resisted L shoulder ER isometrics in neutral, PT manual resistance 10x3 with 5 second holds  Seated manually resisted shoulder Return from flexion 10x3 returng from scaption 10x3 Return from abduction 10x3  Seated B shoulder horizontal abduction 10x5 seconds for 3 sets.    Seated L shoulder flexion AROM 10x5 seconds    Seated  L shoulder abduction 7x5 seconds challenging   Improved exercise technique, movement at target joints, use of target muscles after mod verbal, visual, tactile cues.     Response to  treatment Pt tolerated session well without aggravation of symptoms  Clinical impression  Decreased overall L shoulder locking up sensation reported by pt. Continued working on AROM, anterior, posterior, lateral shoulder and scapular strengthening to promote ability to raise her arm up with less difficulty. Stiff end feel. Pt tolerated session well without aggravation of symptoms. Pt will benefit from continued skilled physical therapy services to improve ROM, strength, and function.      PT Short Term Goals - 06/28/20 0913      PT SHORT TERM GOAL #1   Title Pt will be independent with her initial HEP to improve ROM and ability to raise her arm with less  difficulty when appropriate.    Baseline Pt has not been able to perform much of her HEP (05/07/2020); Has not been performing her HEP (06/28/2020)    Time 3    Period Weeks    Status On-going    Target Date 07/19/20             PT Long Term Goals - 06/28/20 0932      PT LONG TERM GOAL #1   Title Patient will improve L shoulder PROM flexion, and abduction AROM to at least 155 degrees, and ER to 80 degrees to promote ability to reach with less difficulty when appropriate.    Baseline PROM flexion 70 degrees, abduction 58 degrees, ER 5 degrees (scapular plane) 03/27/2020.; AAROM reclined: 125 degrees flexion, 95 degrees abduction, 22 degrees ER in scapular plane (05/07/20);L shoulder standing AROM: flexion 118 degrees, 115 degrees, ER (at about 90 degrees abduction): 43 degrees, IR (at about 90 degrees abduction): 60 degrees (06/21/2020)    Time 6    Period Weeks    Status Partially Met    Target Date 08/02/20      PT LONG TERM GOAL #2   Title Pt will have at least 145 degrees L shoulder flexion and abduction AROM, and 75 degrees ER AROM to promote ability to reach with less difficulty when appropriate.    Baseline AROM not yet performed (03/27/2020);AAROM reclined: 125 degrees flexion, 95 degrees abduction, 22 degrees ER in scapular  plane (05/07/20);L shoulder standing AROM: flexion 118 degrees, 115 degrees, ER (at about 90 degrees abduction): 43 degrees, IR (at about 90 degrees abduction): 60 degrees    Time 6    Period Weeks    Status Partially Met    Target Date 08/02/20      PT LONG TERM GOAL #3   Title PT will improve her FOTO score by at least 20 points as a demonstration of improved function.    Baseline Shoulder FOTO: 34 (03/27/2020); 50 (05/07/2020); 49 (06/07/2020)    Time 6    Period Weeks    Status Partially Met      PT LONG TERM GOAL #4   Title Pt will have at least 4+/5 MMT grade L shoulder flexion, abduction, ER and IR to promote ability to perform functional tasks.    Baseline Manual resistance not yet performed. (05/07/2020); L shoulder flexion 4/5, abduction 4+/5, ER 4/5, IR 4+/5   (06/21/2020)    Time 6    Period Weeks    Status Partially Met    Target Date 08/02/20                 Plan - 07/04/20 1256    Clinical Impression Statement Decreased overall L shoulder locking up sensation reported by pt. Continued working on AROM, anterior, posterior, lateral shoulder and scapular strengthening to promote ability to raise her arm up with less difficulty. Stiff end feel. Pt tolerated session well without aggravation of symptoms. Pt will benefit from continued skilled physical therapy services to improve ROM, strength, and function.    Personal Factors and Comorbidities Age;Comorbidity 3+;Fitness;Time since onset of injury/illness/exacerbation    Comorbidities anxiety, depression, DM, HTN    Examination-Activity Limitations Bathing;Hygiene/Grooming;Lift;Reach Overhead;Toileting;Carry    Stability/Clinical Decision Making Stable/Uncomplicated    Clinical Decision Making Low    Rehab Potential Fair    Clinical Impairments Affecting Rehab Potential Possible difficulty adhering to precautions. Pt needs constant reminder and cueing for PROM during the first few weeks of rehab.  PT Frequency 2x / week     PT Duration 6 weeks    PT Treatment/Interventions Therapeutic activities;Therapeutic exercise;Neuromuscular re-education;Patient/family education;Manual techniques;Passive range of motion;Dry needling;Vestibular;Canalith Repostioning;Electrical Stimulation;Iontophoresis 55m/ml Dexamethasone    PT Next Visit Plan PROM, manual techniques, modalities PRN. [facility] HealthReverse Total Shoulder Replacement Protocol unless otherwise noted by MD.    PDeltaAccess Code [facility]   Consulted and Agree with Plan of Care Patient           Patient will benefit from skilled therapeutic intervention in order to improve the following deficits and impairments:  Pain,Postural dysfunction,Improper body mechanics,Decreased strength,Decreased range of motion  Visit Diagnosis: Stiffness of left shoulder, not elsewhere classified  Chronic left shoulder pain  Muscle weakness (generalized)     Problem List Patient Active Problem List   Diagnosis Date Noted  . 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 Chronic cough 11/19/2016  . Morbid obesity ([place] 09/26/2016  . Left ovarian cyst 05/30/2016  . S/P lumbar spinal fusion 12/13/2015  . Aortic calcification ([place] 07/18/2015  . Controlled type 2 diabetes mellitus without complication ([place] 065/07/5463 . Thrombocytopenia ([place] 05/16/2015  . Recurrent major depressive disorder, in full remission ([place] 05/16/2015  . Essential (primary) hypertension 05/16/2015  . Fatty infiltration of liver 03/15/2015  . Aortic valve stenosis, nonrheumatic 01/18/2015  . Sciatica of right side 01/09/2015  . Type 2 diabetes mellitus ([place] 11/10/2014  . RLS (restless legs syndrome) 08/23/2014  . Neuritis or radiculitis due to rupture of lumbar intervertebral disc 06/13/2014  . Degeneration of intervertebral disc of lumbar region  06/13/2014  . Lumbar radiculitis 06/13/2014  . Arthritis 01/23/2014  . Arthritis of knee, degenerative 11/15/2013  . Depression 09/29/2013  . Insomnia 09/29/2013  . Apnea, sleep 08/29/2013  . History of nephrolithiasis 08/29/2013  . Abnormal presence of protein in urine 08/29/2013  . Headache, migraine 08/29/2013  . BP (high blood pressure) 08/29/2013  . HLD (hyperlipidemia) 08/29/2013  . Allergic rhinitis 08/29/2013  . Intractable migraine without aura 02/18/2013    MJoneen BoersPT, DPT   07/04/2020, 1:51 PM  [facility] [address] [place] [address]Phone: [phone]  Fax:  [phone] Name: Jessica SEIDNERMRN: [id number] of Birth: 707-09-1950

## 2020-07-05 ENCOUNTER — Ambulatory Visit: Payer: Medicare HMO | Admitting: Urology

## 2020-07-05 ENCOUNTER — Other Ambulatory Visit: Payer: Self-pay | Admitting: Urology

## 2020-07-11 ENCOUNTER — Ambulatory Visit: Payer: Medicare HMO

## 2020-07-11 ENCOUNTER — Other Ambulatory Visit: Payer: Self-pay

## 2020-07-11 DIAGNOSIS — M25612 Stiffness of left shoulder, not elsewhere classified: Secondary | ICD-10-CM | POA: Diagnosis not present

## 2020-07-11 DIAGNOSIS — M6281 Muscle weakness (generalized): Secondary | ICD-10-CM

## 2020-07-11 DIAGNOSIS — G8929 Other chronic pain: Secondary | ICD-10-CM

## 2020-07-11 NOTE — Therapy (Signed)
[facility] [address] [place], [address] Phone: [phone]   Fax:  [phone]  Physical Therapy Treatment  Patient Details  Name: Jessica Cole MRN: [id number] Date of Birth: [birth year] Referring Provider (PT): Lattie Corns, [place]   Encounter Date: 07/11/2020   PT End of Session - 07/11/20 1637    Visit Number 23    Number of Visits 37    Date for PT Re-Evaluation 08/02/20    Authorization Type 3    Authorization Time Period of 10 progress report    PT Start Time 1636    PT Stop Time 1717    PT Time Calculation (min) 41 min    Activity Tolerance Patient tolerated treatment well    Behavior During Therapy [facility] for tasks assessed/performed           Past Medical History:  Diagnosis Date  . Anxiety   . Asthma    seasonal with allergies  . Colon polyps   . Degenerative arthritis   . Depression   . Diabetes mellitus without complication ([employer])    Type II  . Environmental allergies   . Family history of adverse reaction to anesthesia    sister- PONV  . Fatty liver   . Glaucoma   . Headache(784.0)    HX  MIGRAINES  . History of bronchitis   . History of kidney stones   . History of pneumonia   . Hypertension   . Hypothyroidism   . Obesity   . OSA on CPAP   . Plantar fasciitis    right  . PONV (postoperative nausea and vomiting)   . Renal calculi   . Renal calculi   . RLS (restless legs syndrome) 08/23/2014    Past Surgical History:  Procedure Laterality Date  . ABDOMINAL HYSTERECTOMY     partial  . APPENDECTOMY    . Arthroscopic surgery, knee Left   . BREAST BIOPSY Right    several  . BREAST BIOPSY Right 03/11/2017   u/s bx neg  . BUNIONECTOMY     LEFT   . COLONOSCOPY W/ POLYPECTOMY    . COLONOSCOPY WITH PROPOFOL N/A 08/14/2017   Procedure: COLONOSCOPY WITH PROPOFOL;  Surgeon: Lollie Sails, MD;  Location: [facility] ENDOSCOPY;  Service: Endoscopy;  Laterality: N/A;  .  EXTRACORPOREAL SHOCK WAVE LITHOTRIPSY Left 05/14/2017   Procedure: EXTRACORPOREAL SHOCK WAVE LITHOTRIPSY (ESWL);  Surgeon: Abbie Sons, MD;  Location: [facility] ORS;  Service: Urology;  Laterality: Left;  . EXTRACORPOREAL SHOCK WAVE LITHOTRIPSY Left 06/14/2020   Procedure: EXTRACORPOREAL SHOCK WAVE LITHOTRIPSY (ESWL);  Surgeon: Billey Co, MD;  Location: [facility] ORS;  Service: Urology;  Laterality: Left;  . FOOT SURGERY     RIGHT    . KNEE ARTHROSCOPY W/ MENISCAL REPAIR Left   . LASIK    . MAXIMUM ACCESS (MAS)POSTERIOR LUMBAR INTERBODY FUSION (PLIF) 1 LEVEL N/A 04/25/2016   Procedure: LUMBAR FOUR-FIVE  MAXIMUM ACCESS (MAS) POSTERIOR LUMBAR INTERBODY FUSION (PLIF) with extension of instrumentation LUMBAR TWO-FIVE;  Surgeon: Eustace Moore, MD;  Location: [facility];  Service: Neurosurgery;  Laterality: N/A;  . MAXIMUM ACCESS (MAS)POSTERIOR LUMBAR INTERBODY FUSION (PLIF) 2 LEVEL N/A 12/13/2015   Procedure: Lumbar two-three - Lumbar three-four MAXIMUM ACCESS (MAS) POSTERIOR LUMBAR INTERBODY FUSION (PLIF)  ;  Surgeon: Eustace Moore, MD;  Location: [facility] NEURO ORS;  Service: Neurosurgery;  Laterality: N/A;  . REVERSE SHOULDER ARTHROPLASTY Left 03/01/2020   Procedure: REVERSE SHOULDER ARTHROPLASTY;  Surgeon: Corky Mull, MD;  Location: [facility] ORS;  Service: Orthopedics;  Laterality: Left;  . SHOULDER SURGERY     RIGHT   . TONSILLECTOMY      There were no vitals filed for this visit.   Subjective Assessment - 07/11/20 1639    Subjective L shoulder is pretty good. No pain currently.    Pertinent History S/P L reverse total shoulder replacment. Did not know about only PROM for now. Had home health PT for 2 weeks which involved PROM shoulder abduction, flexion as well as isometric shoulder flexion and abduction.  Pt sleeps on her bed.  Next surgeon appointment is either first or second week of December around Thurday of Friday.  Pt also states having ringing in her ears which started since the surgery.     Patient Stated Goals Raise her L arm better, be able to pick up things as well as work on crafts.    Currently in Pain? No/denies    Pain Onset More than a month ago                                     PT Education - 07/11/20 1658    Education provided Yes    Education Details ther-ex    [employer]) Educated Patient    Methods Explanation;Demonstration;Tactile cues;Verbal cues    Comprehension Returned demonstration;Verbalized understanding          Objective  No latex band allergies  03/27/2020: Blood pressure R arm sitting, mechanically taken, normal cuff: 170/80, HR 59  Last home health session was about over a week ago  L shoulder incisions: healing, scab formation with slight redness around the scabs.  16weeks post op   MedbridgeAccess Code [facility]  Therapeutic exercise  seatedL shoulder A/AROMwith PT flexion 10x3 scaption 10x3 Abduction 10x3 ER 10x3    Standing triceps extension L  Yellow band 10x5 seconds for 3 sets  Standing L shoulder extension with scapular retraction yellow band 10x3  Standing L shoulder ER yellow band 10x2   Seated manually resisted shoulder Return from flexion 10x3 returng from scaption 10x3 Return from abduction 10x3  L shoulder AROM   Flexion: 122 degrees  Abduction: 113 degrees    Improved exercise technique, movement at target joints, use of target muscles after mod verbal, visual, tactile cues.   Manual therapy Seated STM L upper trap muscle to decrease tension    L shoulder flexion AROM improved to 124 degrees afterwards    Response to treatment Pt tolerated session well without aggravation of symptoms  Clinical impression  Slight improved L shoulder flexion AROM since last measured in February. L shoulder abduction similar AROM. Continued working on decreasing stiffness  as well as improving scapular, and deltoid strength to promote ability to raise her arm up with less difficulty. Pt tolerated session well without aggravation of symptoms. Pt will benefit from continued skilled physical therapy services to improve ROM, strength, and function.      PT Short Term Goals - 06/28/20 0913      PT SHORT TERM GOAL #1   Title Pt will be independent with her initial HEP to improve ROM and ability to raise her arm with less difficulty when appropriate.    Baseline Pt has not been able to perform much of her HEP (05/07/2020); Has not been performing her HEP (06/28/2020)    Time 3    Period Weeks  Status On-going    Target Date 07/19/20             PT Long Term Goals - 06/28/20 0932      PT LONG TERM GOAL #1   Title Patient will improve L shoulder PROM flexion, and abduction AROM to at least 155 degrees, and ER to 80 degrees to promote ability to reach with less difficulty when appropriate.    Baseline PROM flexion 70 degrees, abduction 58 degrees, ER 5 degrees (scapular plane) 03/27/2020.; AAROM reclined: 125 degrees flexion, 95 degrees abduction, 22 degrees ER in scapular plane (05/07/20);L shoulder standing AROM: flexion 118 degrees, 115 degrees, ER (at about 90 degrees abduction): 43 degrees, IR (at about 90 degrees abduction): 60 degrees (06/21/2020)    Time 6    Period Weeks    Status Partially Met    Target Date 08/02/20      PT LONG TERM GOAL #2   Title Pt will have at least 145 degrees L shoulder flexion and abduction AROM, and 75 degrees ER AROM to promote ability to reach with less difficulty when appropriate.    Baseline AROM not yet performed (03/27/2020);AAROM reclined: 125 degrees flexion, 95 degrees abduction, 22 degrees ER in scapular plane (05/07/20);L shoulder standing AROM: flexion 118 degrees, 115 degrees, ER (at about 90 degrees abduction): 43 degrees, IR (at about 90 degrees abduction): 60 degrees    Time 6    Period Weeks    Status Partially  Met    Target Date 08/02/20      PT LONG TERM GOAL #3   Title PT will improve her FOTO score by at least 20 points as a demonstration of improved function.    Baseline Shoulder FOTO: 34 (03/27/2020); 50 (05/07/2020); 49 (06/07/2020)    Time 6    Period Weeks    Status Partially Met      PT LONG TERM GOAL #4   Title Pt will have at least 4+/5 MMT grade L shoulder flexion, abduction, ER and IR to promote ability to perform functional tasks.    Baseline Manual resistance not yet performed. (05/07/2020); L shoulder flexion 4/5, abduction 4+/5, ER 4/5, IR 4+/5   (06/21/2020)    Time 6    Period Weeks    Status Partially Met    Target Date 08/02/20                 Plan - 07/11/20 1701    Clinical Impression Statement Slight improved L shoulder flexion AROM since last measured in February. L shoulder abduction similar AROM. Continued working on decreasing stiffness as well as improving scapular, and deltoid strength to promote ability to raise her arm up with less difficulty. Pt tolerated session well without aggravation of symptoms. Pt will benefit from continued skilled physical therapy services to improve ROM, strength, and function.    Personal Factors and Comorbidities Age;Comorbidity 3+;Fitness;Time since onset of injury/illness/exacerbation    Comorbidities anxiety, depression, DM, HTN    Examination-Activity Limitations Bathing;Hygiene/Grooming;Lift;Reach Overhead;Toileting;Carry    Stability/Clinical Decision Making Stable/Uncomplicated    Clinical Decision Making Low    Rehab Potential Fair    Clinical Impairments Affecting Rehab Potential Possible difficulty adhering to precautions. Pt needs constant reminder and cueing for PROM during the first few weeks of rehab.    PT Frequency 2x / week    PT Duration 6 weeks    PT Treatment/Interventions Therapeutic activities;Therapeutic exercise;Neuromuscular re-education;Patient/family education;Manual techniques;Passive range of motion;Dry  needling;Vestibular;Canalith Repostioning;Electrical Stimulation;Iontophoresis 4mg /ml Dexamethasone  PT Next Visit Plan PROM, manual techniques, modalities PRN. [facility] Reverse Total Shoulder Replacement Protocol unless otherwise noted by MD.    [place] Access Code [facility]    Consulted and Agree with Plan of Care Patient           Patient will benefit from skilled therapeutic intervention in order to improve the following deficits and impairments:  Pain,Postural dysfunction,Improper body mechanics,Decreased strength,Decreased range of motion  Visit Diagnosis: Stiffness of left shoulder, not elsewhere classified  Chronic left shoulder pain  Muscle weakness (generalized)     Problem List Patient Active Problem List   Diagnosis Date Noted  . 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 edema associated with type 2 diabetes mellitus ([place]) 01/03/2019  . Chronic cough 11/19/2016  . Morbid obesity ([place]) 09/26/2016  . Left ovarian cyst 05/30/2016  . S/P lumbar spinal fusion 12/13/2015  . Aortic calcification ([place]) 07/18/2015  . Controlled type 2 diabetes mellitus without complication ([facility]) 16/57/9038  . Thrombocytopenia ([place]) 05/16/2015  . Recurrent major depressive disorder, in full remission ([place]) 05/16/2015  . Essential (primary) hypertension 05/16/2015  . Fatty infiltration of liver 03/15/2015  . Aortic valve stenosis, nonrheumatic 01/18/2015  . Sciatica of right side 01/09/2015  . Type 2 diabetes mellitus ([place]) 11/10/2014  . RLS (restless legs syndrome) 08/23/2014  . Neuritis or radiculitis due to rupture of lumbar intervertebral disc 06/13/2014  . Degeneration of intervertebral disc of lumbar region 06/13/2014  . Lumbar radiculitis 06/13/2014  . Arthritis 01/23/2014  . Arthritis of knee, degenerative 11/15/2013  . Depression 09/29/2013  . Insomnia 09/29/2013  . Apnea, sleep 08/29/2013  .  History of nephrolithiasis 08/29/2013  . Abnormal presence of protein in urine 08/29/2013  . Headache, migraine 08/29/2013  . BP (high blood pressure) 08/29/2013  . HLD (hyperlipidemia) 08/29/2013  . Allergic rhinitis 08/29/2013  . Intractable migraine without aura 02/18/2013    Joneen Boers PT, DPT   07/11/2020, 5:26 PM  [facility] [address] [place], [address] Phone: [phone]   Fax:  [phone]  Name: MAMIE HUNDERTMARK MRN: [id number] Date of Birth: 1950/12/23

## 2020-07-15 ENCOUNTER — Other Ambulatory Visit: Payer: Self-pay | Admitting: Neurology

## 2020-07-17 ENCOUNTER — Other Ambulatory Visit: Payer: Self-pay

## 2020-07-17 ENCOUNTER — Ambulatory Visit: Payer: Medicare HMO

## 2020-07-17 DIAGNOSIS — M6281 Muscle weakness (generalized): Secondary | ICD-10-CM

## 2020-07-17 DIAGNOSIS — G8929 Other chronic pain: Secondary | ICD-10-CM

## 2020-07-17 DIAGNOSIS — M25612 Stiffness of left shoulder, not elsewhere classified: Secondary | ICD-10-CM

## 2020-07-17 NOTE — Therapy (Signed)
[facility] [address], [place], [address] Phone: [phone]   Fax:  [phone]  Physical Therapy Treatment  Patient Details  Name: Jessica Cole MRN: [id number] Date of Birth: 08-26-1950 Referring Provider (PT): Lattie Corns, [place]   Encounter Date: 07/17/2020    PT End of Session - 07/17/20 0935    Visit Number 24    Number of Visits 37    Date for PT Re-Evaluation 08/02/20    Authorization Type 4    Authorization Time Period of 10 progress report    PT Start Time 0935    PT Stop Time 1014    PT Time Calculation (min) 39 min    Activity Tolerance Patient tolerated treatment well    Behavior During Therapy [facility] for tasks assessed/performed           Past Medical History:  Diagnosis Date  . Anxiety   . Asthma    seasonal with allergies  . Colon polyps   . Degenerative arthritis   . Depression   . Diabetes mellitus without complication ([place])    Type II  . Environmental allergies   . Family history of adverse reaction to anesthesia    sister- PONV  . Fatty liver   . Glaucoma   . Headache(784.0)    HX  MIGRAINES  . History of bronchitis   . History of kidney stones   . History of pneumonia   . Hypertension   . Hypothyroidism   . Obesity   . OSA on CPAP   . Plantar fasciitis    right  . PONV (postoperative nausea and vomiting)   . Renal calculi   . Renal calculi   . RLS (restless legs syndrome) 08/23/2014    Past Surgical History:  Procedure Laterality Date  . ABDOMINAL HYSTERECTOMY     partial  . APPENDECTOMY    . Arthroscopic surgery, knee Left   . BREAST BIOPSY Right    several  . BREAST BIOPSY Right 03/11/2017   u/s bx neg  . BUNIONECTOMY     LEFT   . COLONOSCOPY W/ POLYPECTOMY    . COLONOSCOPY WITH PROPOFOL N/A 08/14/2017   Procedure: COLONOSCOPY WITH PROPOFOL;  Surgeon: Lollie Sails, MD;  Location: [facility] ENDOSCOPY;  Service: Endoscopy;  Laterality: N/A;  .  EXTRACORPOREAL SHOCK WAVE LITHOTRIPSY Left 05/14/2017   Procedure: EXTRACORPOREAL SHOCK WAVE LITHOTRIPSY (ESWL);  Surgeon: Abbie Sons, MD;  Location: [facility] ORS;  Service: Urology;  Laterality: Left;  . EXTRACORPOREAL SHOCK WAVE LITHOTRIPSY Left 06/14/2020   Procedure: EXTRACORPOREAL SHOCK WAVE LITHOTRIPSY (ESWL);  Surgeon: Billey Co, MD;  Location: [facility] ORS;  Service: Urology;  Laterality: Left;  . FOOT SURGERY     RIGHT    . KNEE ARTHROSCOPY W/ MENISCAL REPAIR Left   . LASIK    . MAXIMUM ACCESS (MAS)POSTERIOR LUMBAR INTERBODY FUSION (PLIF) 1 LEVEL N/A 04/25/2016   Procedure: LUMBAR FOUR-FIVE  MAXIMUM ACCESS (MAS) POSTERIOR LUMBAR INTERBODY FUSION (PLIF) with extension of instrumentation LUMBAR TWO-FIVE;  Surgeon: Eustace Moore, MD;  Location: [facility];  Service: Neurosurgery;  Laterality: N/A;  . MAXIMUM ACCESS (MAS)POSTERIOR LUMBAR INTERBODY FUSION (PLIF) 2 LEVEL N/A 12/13/2015   Procedure: Lumbar two-three - Lumbar three-four MAXIMUM ACCESS (MAS) POSTERIOR LUMBAR INTERBODY FUSION (PLIF)  ;  Surgeon: Eustace Moore, MD;  Location: [facility] NEURO ORS;  Service: Neurosurgery;  Laterality: N/A;  . REVERSE SHOULDER ARTHROPLASTY Left 03/01/2020   Procedure: REVERSE SHOULDER  ARTHROPLASTY;  Surgeon: Corky Mull, MD;  Location: [facility] ORS;  Service: Orthopedics;  Laterality: Left;  . SHOULDER SURGERY     RIGHT   . TONSILLECTOMY      There were no vitals filed for this visit.   Subjective Assessment - 07/17/20 0937    Subjective L shoulder is a little sore in the front. Did some exercises this morning. Shoulder locking is not as bad this week. Better able to reach behind her head to fix her hair.    Pertinent History S/P L reverse total shoulder replacment. Did not know about only PROM for now. Had home health PT for 2 weeks which involved PROM shoulder abduction, flexion as well as isometric shoulder flexion and abduction.  Pt sleeps on her bed.  Next surgeon appointment is either first or second  week of December around Thurday of Friday.  Pt also states having ringing in her ears which started since the surgery.    Patient Stated Goals Raise her L arm better, be able to pick up things as well as work on crafts.    Currently in Pain? Other (Comment)   "Not real pain pain"  "Sore"   Pain Onset More than a month ago                                     PT Education - 07/17/20 1929    Education provided Yes    Education Details ther-ex    [employer]) Educated Patient    Methods Explanation;Demonstration;Tactile cues;Verbal cues    Comprehension Returned demonstration;Verbalized understanding          Objective  No latex band allergies  03/27/2020: Blood pressure R arm sitting, mechanically taken, normal cuff: 170/80, HR 59  Last home health session was about over a week ago  L shoulder incisions: healing, scab formation with slight redness around the scabs.    MedbridgeAccess Code [email or web address]  Therapeutic exercise  seatedL shoulder A/AROMwith PT flexion 10x3 scaption 10x3 Abduction 10x3 ER 10x3   L shoulder AROM              Flexion: 126 degrees             Abduction: 125 degrees   Seated manually resisted shoulder Return from flexion 10x3 returng from scaption 10x3 Return from abduction 10x3  Seated manually resisted L shoulder ER with PT 10x3  Good muscle use felt, and observed after PT cues for proper movement  Seated L triceps extension manually resisted with PT 10x3  Manual resistance to promote isokinetic contraction  Manually resisted L scapular retraction targeting lower trap 10x5 seconds for 2 sets   Improved exercise technique, movement at target joints, use of target muscles after mod verbal, visual, tactile cues.   Manual therapy Seated STM L upper trap muscle to decrease tension              Response  to treatment Pt tolerated session well without aggravation of symptoms  Clinical impression  Improved L shoulder flexion and abduction AROM since last session. Continued working on decreasing stiffness as well as improving L UE strength to promote ability to raise her arm up with less difficulty. Pt tolerated session well without aggravation of symptoms. PT will benefit from continued skilled physical therapy services to improve ROM, strength and function.      PT Short Term Goals - 06/28/20 9381  PT SHORT TERM GOAL #1   Title Pt will be independent with her initial HEP to improve ROM and ability to raise her arm with less difficulty when appropriate.    Baseline Pt has not been able to perform much of her HEP (05/07/2020); Has not been performing her HEP (06/28/2020)    Time 3    Period Weeks    Status On-going    Target Date 07/19/20             PT Long Term Goals - 06/28/20 0932      PT LONG TERM GOAL #1   Title Patient will improve L shoulder PROM flexion, and abduction AROM to at least 155 degrees, and ER to 80 degrees to promote ability to reach with less difficulty when appropriate.    Baseline PROM flexion 70 degrees, abduction 58 degrees, ER 5 degrees (scapular plane) 03/27/2020.; AAROM reclined: 125 degrees flexion, 95 degrees abduction, 22 degrees ER in scapular plane (05/07/20);L shoulder standing AROM: flexion 118 degrees, 115 degrees, ER (at about 90 degrees abduction): 43 degrees, IR (at about 90 degrees abduction): 60 degrees (06/21/2020)    Time 6    Period Weeks    Status Partially Met    Target Date 08/02/20      PT LONG TERM GOAL #2   Title Pt will have at least 145 degrees L shoulder flexion and abduction AROM, and 75 degrees ER AROM to promote ability to reach with less difficulty when appropriate.    Baseline AROM not yet performed (03/27/2020);AAROM reclined: 125 degrees flexion, 95 degrees abduction, 22 degrees ER in scapular plane (05/07/20);L shoulder  standing AROM: flexion 118 degrees, 115 degrees, ER (at about 90 degrees abduction): 43 degrees, IR (at about 90 degrees abduction): 60 degrees    Time 6    Period Weeks    Status Partially Met    Target Date 08/02/20      PT LONG TERM GOAL #3   Title PT will improve her FOTO score by at least 20 points as a demonstration of improved function.    Baseline Shoulder FOTO: 34 (03/27/2020); 50 (05/07/2020); 49 (06/07/2020)    Time 6    Period Weeks    Status Partially Met      PT LONG TERM GOAL #4   Title Pt will have at least 4+/5 MMT grade L shoulder flexion, abduction, ER and IR to promote ability to perform functional tasks.    Baseline Manual resistance not yet performed. (05/07/2020); L shoulder flexion 4/5, abduction 4+/5, ER 4/5, IR 4+/5   (06/21/2020)    Time 6    Period Weeks    Status Partially Met    Target Date 08/02/20                 Plan - 07/17/20 1930    Clinical Impression Statement Improved L shoulder flexion and abduction AROM since last session. Continued working on decreasing stiffness as well as improving L UE strength to promote ability to raise her arm up with less difficulty. Pt tolerated session well without aggravation of symptoms. PT will benefit from continued skilled physical therapy services to improve ROM, strength and function.    Personal Factors and Comorbidities Age;Comorbidity 3+;Fitness;Time since onset of injury/illness/exacerbation    Comorbidities anxiety, depression, DM, HTN    Examination-Activity Limitations Bathing;Hygiene/Grooming;Lift;Reach Overhead;Toileting;Carry    Stability/Clinical Decision Making Stable/Uncomplicated    Clinical Decision Making Low    Rehab Potential Fair    Clinical Impairments Affecting  Rehab Potential Possible difficulty adhering to precautions. Pt needs constant reminder and cueing for PROM during the first few weeks of rehab.    PT Frequency 2x / week    PT Duration 6 weeks    PT Treatment/Interventions  Therapeutic activities;Therapeutic exercise;Neuromuscular re-education;Patient/family education;Manual techniques;Passive range of motion;Dry needling;Vestibular;Canalith Repostioning;Electrical Stimulation;Iontophoresis 30m/ml Dexamethasone    PT Next Visit Plan PROM, manual techniques, modalities PRN. [facility] Shoulder Replacement Protocol unless otherwise noted by MD.    PSmeltervilleAccess Code [facility]   Consulted and Agree with Plan of Care Patient           Patient will benefit from skilled therapeutic intervention in order to improve the following deficits and impairments:  Pain,Postural dysfunction,Improper body mechanics,Decreased strength,Decreased range of motion  Visit Diagnosis: Stiffness of left shoulder, not elsewhere classified  Chronic left shoulder pain  Muscle weakness (generalized)     Problem List Patient Active Problem List   Diagnosis Date Noted  . 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 edema associated with type 2 diabetes mellitus ([place] 01/03/2019  . Chronic cough 11/19/2016  . Morbid obesity ([place] 09/26/2016  . Left ovarian cyst 05/30/2016  . S/P lumbar spinal fusion 12/13/2015  . Aortic calcification ([place] 07/18/2015  . Controlled type 2 diabetes mellitus without complication ([facility] 073/71/0626 . Thrombocytopenia ([place] 05/16/2015  . Recurrent major depressive disorder, in full remission ([place] 05/16/2015  . Essential (primary) hypertension 05/16/2015  . Fatty infiltration of liver 03/15/2015  . Aortic valve stenosis, nonrheumatic 01/18/2015  . Sciatica of right side 01/09/2015  . Type 2 diabetes mellitus ([place] 11/10/2014  . RLS (restless legs syndrome) 08/23/2014  . Neuritis or radiculitis due to rupture of lumbar intervertebral disc 06/13/2014  . Degeneration of intervertebral disc of lumbar region 06/13/2014  . Lumbar radiculitis 06/13/2014  .  Arthritis 01/23/2014  . Arthritis of knee, degenerative 11/15/2013  . Depression 09/29/2013  . Insomnia 09/29/2013  . Apnea, sleep 08/29/2013  . History of nephrolithiasis 08/29/2013  . Abnormal presence of protein in urine 08/29/2013  . Headache, migraine 08/29/2013  . BP (high blood pressure) 08/29/2013  . HLD (hyperlipidemia) 08/29/2013  . Allergic rhinitis 08/29/2013  . Intractable migraine without aura 02/18/2013    MJoneen BoersPT, DPT   07/17/2020, 7:34 PM  [facility] [address] [place] [id number]: [phone]  Fax:  [phone] Name: Jessica ASCHENBRENNERMRN: [id number] of Birth: [birth year]

## 2020-07-18 ENCOUNTER — Ambulatory Visit: Payer: Medicare HMO

## 2020-07-18 DIAGNOSIS — M25612 Stiffness of left shoulder, not elsewhere classified: Secondary | ICD-10-CM

## 2020-07-18 DIAGNOSIS — M6281 Muscle weakness (generalized): Secondary | ICD-10-CM

## 2020-07-18 DIAGNOSIS — G8929 Other chronic pain: Secondary | ICD-10-CM

## 2020-07-18 NOTE — Therapy (Signed)
[place] [facility] [address], [place], [address] Phone: [phone]   Fax:  [phone]  Physical Therapy Treatment  Patient Details  Name: Jessica Cole MRN: [id number] Date of Birth: Sep 07, 1950 Referring Provider (PT): Lattie Corns, [place]   Encounter Date: 07/18/2020   PT End of Session - 07/18/20 1635    Visit Number 25    Number of Visits 37    Date for PT Re-Evaluation 08/02/20    Authorization Type 5    Authorization Time Period of 10 progress report    PT Start Time 1636    PT Stop Time 1701    PT Time Calculation (min) 25 min    Activity Tolerance Patient tolerated treatment well    Behavior During Therapy [facility] for tasks assessed/performed           Past Medical History:  Diagnosis Date  . Anxiety   . Asthma    seasonal with allergies  . Colon polyps   . Degenerative arthritis   . Depression   . Diabetes mellitus without complication ([place])    Type II  . Environmental allergies   . Family history of adverse reaction to anesthesia    sister- PONV  . Fatty liver   . Glaucoma   . Headache(784.0)    HX  MIGRAINES  . History of bronchitis   . History of kidney stones   . History of pneumonia   . Hypertension   . Hypothyroidism   . Obesity   . OSA on CPAP   . Plantar fasciitis    right  . PONV (postoperative nausea and vomiting)   . Renal calculi   . Renal calculi   . RLS (restless legs syndrome) 08/23/2014    Past Surgical History:  Procedure Laterality Date  . ABDOMINAL HYSTERECTOMY     partial  . APPENDECTOMY    . Arthroscopic surgery, knee Left   . BREAST BIOPSY Right    several  . BREAST BIOPSY Right 03/11/2017   u/s bx neg  . BUNIONECTOMY     LEFT   . COLONOSCOPY W/ POLYPECTOMY    . COLONOSCOPY WITH PROPOFOL N/A 08/14/2017   Procedure: COLONOSCOPY WITH PROPOFOL;  Surgeon: Lollie Sails, MD;  Location: [facility] ENDOSCOPY;  Service: Endoscopy;  Laterality: N/A;  .  EXTRACORPOREAL SHOCK WAVE LITHOTRIPSY Left 05/14/2017   Procedure: EXTRACORPOREAL SHOCK WAVE LITHOTRIPSY (ESWL);  Surgeon: Abbie Sons, MD;  Location: [facility] ORS;  Service: Urology;  Laterality: Left;  . EXTRACORPOREAL SHOCK WAVE LITHOTRIPSY Left 06/14/2020   Procedure: EXTRACORPOREAL SHOCK WAVE LITHOTRIPSY (ESWL);  Surgeon: Billey Co, MD;  Location: [facility] ORS;  Service: Urology;  Laterality: Left;  . FOOT SURGERY     RIGHT    . KNEE ARTHROSCOPY W/ MENISCAL REPAIR Left   . LASIK    . MAXIMUM ACCESS (MAS)POSTERIOR LUMBAR INTERBODY FUSION (PLIF) 1 LEVEL N/A 04/25/2016   Procedure: LUMBAR FOUR-FIVE  MAXIMUM ACCESS (MAS) POSTERIOR LUMBAR INTERBODY FUSION (PLIF) with extension of instrumentation LUMBAR TWO-FIVE;  Surgeon: Eustace Moore, MD;  Location: [place];  Service: Neurosurgery;  Laterality: N/A;  . MAXIMUM ACCESS (MAS)POSTERIOR LUMBAR INTERBODY FUSION (PLIF) 2 LEVEL N/A 12/13/2015   Procedure: Lumbar two-three - Lumbar three-four MAXIMUM ACCESS (MAS) POSTERIOR LUMBAR INTERBODY FUSION (PLIF)  ;  Surgeon: Eustace Moore, MD;  Location: [facility] NEURO ORS;  Service: Neurosurgery;  Laterality: N/A;  . REVERSE SHOULDER ARTHROPLASTY Left 03/01/2020   Procedure: REVERSE SHOULDER ARTHROPLASTY;  Surgeon: Corky Mull, MD;  Location: [facility] ORS;  Service: Orthopedics;  Laterality: Left;  . SHOULDER SURGERY     RIGHT   . TONSILLECTOMY      There were no vitals filed for this visit.   Subjective Assessment - 07/18/20 1637    Subjective L shoulder just got through unlocking. Other than that, its sore. No pain currently.    Pertinent History S/P L reverse total shoulder replacment. Did not know about only PROM for now. Had home health PT for 2 weeks which involved PROM shoulder abduction, flexion as well as isometric shoulder flexion and abduction.  Pt sleeps on her bed.  Next surgeon appointment is either first or second week of December around Thurday of Friday.  Pt also states having ringing in her ears  which started since the surgery.    Patient Stated Goals Raise her L arm better, be able to pick up things as well as work on crafts.    Currently in Pain? No/denies    Pain Onset More than a month ago                                     PT Education - 07/18/20 1655    Education provided Yes    Education Details ther-ex    [employer]) Educated Patient    Methods Explanation;Demonstration;Tactile cues;Verbal cues    Comprehension Returned demonstration;Verbalized understanding          Objective  No latex band allergies  03/27/2020: Blood pressure R arm sitting, mechanically taken, normal cuff: 170/80, HR 59  Last home health session was about over a week ago  L shoulder incisions: healing, scab formation with slight redness around the scabs.    MedbridgeAccess Code [email or web address]  Therapeutic exercise  seatedL shoulder A/AROMwith PT flexion 10x3 scaption 10x3 Abduction 10x3 ER 10x3    Manually resisted L scapular retraction targeting lower trap 10x5 seconds for 2 sets   Seated L triceps extension manually resisted with PT 10x3   Improved exercise technique, movement at target joints, use of target muscles after mod verbal, visual, tactile cues.   Manual therapy Seated STM L upper trap muscle to decrease tension      Response to treatment Pt tolerated session well without aggravation of symptoms  Clinical impression  Lighter session today secondary to working with pt the day before. Continued working on improving L shoulder ROM to decrease stiffness, as well as impriving scapular strength and decreasing upper trap muscle tension to promote glenohumeral mechanics. Pt tolerated session well without aggravation of symptoms. Pt will benefit from continued skilled physical therapy services to decrease pain, improve ROM, strength and function.         PT Short Term Goals - 06/28/20 0913      PT SHORT TERM GOAL #1   Title Pt will be independent with her initial HEP to improve ROM and ability to raise her arm with less difficulty when appropriate.    Baseline Pt has not been able to perform much of her HEP (05/07/2020); Has not been performing her HEP (06/28/2020)    Time 3    Period Weeks    Status On-going    Target Date 07/19/20             PT Long Term Goals - 06/28/20 0932      PT LONG TERM GOAL #1   Title Patient will  improve L shoulder PROM flexion, and abduction AROM to at least 155 degrees, and ER to 80 degrees to promote ability to reach with less difficulty when appropriate.    Baseline PROM flexion 70 degrees, abduction 58 degrees, ER 5 degrees (scapular plane) 03/27/2020.; AAROM reclined: 125 degrees flexion, 95 degrees abduction, 22 degrees ER in scapular plane (05/07/20);L shoulder standing AROM: flexion 118 degrees, 115 degrees, ER (at about 90 degrees abduction): 43 degrees, IR (at about 90 degrees abduction): 60 degrees (06/21/2020)    Time 6    Period Weeks    Status Partially Met    Target Date 08/02/20      PT LONG TERM GOAL #2   Title Pt will have at least 145 degrees L shoulder flexion and abduction AROM, and 75 degrees ER AROM to promote ability to reach with less difficulty when appropriate.    Baseline AROM not yet performed (03/27/2020);AAROM reclined: 125 degrees flexion, 95 degrees abduction, 22 degrees ER in scapular plane (05/07/20);L shoulder standing AROM: flexion 118 degrees, 115 degrees, ER (at about 90 degrees abduction): 43 degrees, IR (at about 90 degrees abduction): 60 degrees    Time 6    Period Weeks    Status Partially Met    Target Date 08/02/20      PT LONG TERM GOAL #3   Title PT will improve her FOTO score by at least 20 points as a demonstration of improved function.    Baseline Shoulder FOTO: 34 (03/27/2020); 50 (05/07/2020); 49 (06/07/2020)    Time 6    Period Weeks    Status Partially Met       PT LONG TERM GOAL #4   Title Pt will have at least 4+/5 MMT grade L shoulder flexion, abduction, ER and IR to promote ability to perform functional tasks.    Baseline Manual resistance not yet performed. (05/07/2020); L shoulder flexion 4/5, abduction 4+/5, ER 4/5, IR 4+/5   (06/21/2020)    Time 6    Period Weeks    Status Partially Met    Target Date 08/02/20                 Plan - 07/18/20 1705    Clinical Impression Statement Lighter session today secondary to working with pt the day before. Continued working on improving L shoulder ROM to decrease stiffness, as well as impriving scapular strength and decreasing upper trap muscle tension to promote glenohumeral mechanics. Pt tolerated session well without aggravation of symptoms. Pt will benefit from continued skilled physical therapy services to decrease pain, improve ROM, strength and function.    Personal Factors and Comorbidities Age;Comorbidity 3+;Fitness;Time since onset of injury/illness/exacerbation    Comorbidities anxiety, depression, DM, HTN    Examination-Activity Limitations Bathing;Hygiene/Grooming;Lift;Reach Overhead;Toileting;Carry    Stability/Clinical Decision Making Stable/Uncomplicated    Rehab Potential Fair    Clinical Impairments Affecting Rehab Potential Possible difficulty adhering to precautions. Pt needs constant reminder and cueing for PROM during the first few weeks of rehab.    PT Frequency 2x / week    PT Duration 6 weeks    PT Treatment/Interventions Therapeutic activities;Therapeutic exercise;Neuromuscular re-education;Patient/family education;Manual techniques;Passive range of motion;Dry needling;Vestibular;Canalith Repostioning;Electrical Stimulation;Iontophoresis 4mg /ml Dexamethasone    PT Next Visit Plan PROM, manual techniques, modalities PRN. [facility] Reverse Total Shoulder Replacement Protocol unless otherwise noted by MD.    [place]  Access Code [facility]    Consulted and Agree with Plan of Care Patient  Patient will benefit from skilled therapeutic intervention in order to improve the following deficits and impairments:  Pain,Postural dysfunction,Improper body mechanics,Decreased strength,Decreased range of motion  Visit Diagnosis: Stiffness of left shoulder, not elsewhere classified  Chronic left shoulder pain  Muscle weakness (generalized)     Problem List Patient Active Problem List   Diagnosis Date Noted  . 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 edema associated with type 2 diabetes mellitus ([place]) 01/03/2019  . Chronic cough 11/19/2016  . Morbid obesity ([place]) 09/26/2016  . Left ovarian cyst 05/30/2016  . S/P lumbar spinal fusion 12/13/2015  . Aortic calcification ([place]) 07/18/2015  . Controlled type 2 diabetes mellitus without complication ([place]) 69/67/8938  . Thrombocytopenia ([place]) 05/16/2015  . Recurrent major depressive disorder, in full remission ([place]) 05/16/2015  . Essential (primary) hypertension 05/16/2015  . Fatty infiltration of liver 03/15/2015  . Aortic valve stenosis, nonrheumatic 01/18/2015  . Sciatica of right side 01/09/2015  . Type 2 diabetes mellitus ([place]) 11/10/2014  . RLS (restless legs syndrome) 08/23/2014  . Neuritis or radiculitis due to rupture of lumbar intervertebral disc 06/13/2014  . Degeneration of intervertebral disc of lumbar region 06/13/2014  . Lumbar radiculitis 06/13/2014  . Arthritis 01/23/2014  . Arthritis of knee, degenerative 11/15/2013  . Depression 09/29/2013  . Insomnia 09/29/2013  . Apnea, sleep 08/29/2013  . History of nephrolithiasis 08/29/2013  . Abnormal presence of protein in urine 08/29/2013  . Headache, migraine 08/29/2013  . BP (high blood pressure) 08/29/2013  . HLD (hyperlipidemia) 08/29/2013  . Allergic rhinitis 08/29/2013  . Intractable migraine without aura 02/18/2013    Joneen Boers PT,  DPT   07/18/2020, 5:06 PM  [place] [facility] [address] [place], [address] Phone: [phone]   Fax:  [phone]  Name: Jessica Cole MRN: [id number] Date of Birth: 11-08-1950

## 2020-07-24 ENCOUNTER — Ambulatory Visit: Payer: Medicare HMO

## 2020-07-24 ENCOUNTER — Other Ambulatory Visit: Payer: Self-pay | Admitting: Urology

## 2020-07-24 ENCOUNTER — Other Ambulatory Visit: Payer: Self-pay

## 2020-07-24 DIAGNOSIS — M25512 Pain in left shoulder: Secondary | ICD-10-CM

## 2020-07-24 DIAGNOSIS — M25612 Stiffness of left shoulder, not elsewhere classified: Secondary | ICD-10-CM | POA: Diagnosis not present

## 2020-07-24 DIAGNOSIS — N200 Calculus of kidney: Secondary | ICD-10-CM

## 2020-07-24 DIAGNOSIS — G8929 Other chronic pain: Secondary | ICD-10-CM

## 2020-07-24 DIAGNOSIS — M6281 Muscle weakness (generalized): Secondary | ICD-10-CM

## 2020-07-24 NOTE — Therapy (Signed)
[place] [facility] [address] [place], [address] Phone: [phone]   Fax:  [phone]  Physical Therapy Treatment  Patient Details  Name: Jessica Cole MRN: [id number] Date of Birth: 1951-04-02 Referring Provider (PT): Lattie Corns, [place]   Encounter Date: 07/24/2020    PT End of Session - 07/24/20 0850    Visit Number 26    Number of Visits 37    Date for PT Re-Evaluation 08/02/20    Authorization Type 6    Authorization Time Period of 10 progress report    PT Start Time 0850    PT Stop Time 0930    PT Time Calculation (min) 40 min    Activity Tolerance Patient tolerated treatment well    Behavior During Therapy [facility] for tasks assessed/performed           Past Medical History:  Diagnosis Date  . Anxiety   . Asthma    seasonal with allergies  . Colon polyps   . Degenerative arthritis   . Depression   . Diabetes mellitus without complication ([place])    Type II  . Environmental allergies   . Family history of adverse reaction to anesthesia    sister- PONV  . Fatty liver   . Glaucoma   . Headache(784.0)    HX  MIGRAINES  . History of bronchitis   . History of kidney stones   . History of pneumonia   . Hypertension   . Hypothyroidism   . Obesity   . OSA on CPAP   . Plantar fasciitis    right  . PONV (postoperative nausea and vomiting)   . Renal calculi   . Renal calculi   . RLS (restless legs syndrome) 08/23/2014    Past Surgical History:  Procedure Laterality Date  . ABDOMINAL HYSTERECTOMY     partial  . APPENDECTOMY    . Arthroscopic surgery, knee Left   . BREAST BIOPSY Right    several  . BREAST BIOPSY Right 03/11/2017   u/s bx neg  . BUNIONECTOMY     LEFT   . COLONOSCOPY W/ POLYPECTOMY    . COLONOSCOPY WITH PROPOFOL N/A 08/14/2017   Procedure: COLONOSCOPY WITH PROPOFOL;  Surgeon: Lollie Sails, MD;  Location: [facility] ENDOSCOPY;  Service: Endoscopy;  Laterality: N/A;  .  EXTRACORPOREAL SHOCK WAVE LITHOTRIPSY Left 05/14/2017   Procedure: EXTRACORPOREAL SHOCK WAVE LITHOTRIPSY (ESWL);  Surgeon: Abbie Sons, MD;  Location: [facility] ORS;  Service: Urology;  Laterality: Left;  . EXTRACORPOREAL SHOCK WAVE LITHOTRIPSY Left 06/14/2020   Procedure: EXTRACORPOREAL SHOCK WAVE LITHOTRIPSY (ESWL);  Surgeon: Billey Co, MD;  Location: [facility] ORS;  Service: Urology;  Laterality: Left;  . FOOT SURGERY     RIGHT    . KNEE ARTHROSCOPY W/ MENISCAL REPAIR Left   . LASIK    . MAXIMUM ACCESS (MAS)POSTERIOR LUMBAR INTERBODY FUSION (PLIF) 1 LEVEL N/A 04/25/2016   Procedure: LUMBAR FOUR-FIVE  MAXIMUM ACCESS (MAS) POSTERIOR LUMBAR INTERBODY FUSION (PLIF) with extension of instrumentation LUMBAR TWO-FIVE;  Surgeon: Eustace Moore, MD;  Location: [place];  Service: Neurosurgery;  Laterality: N/A;  . MAXIMUM ACCESS (MAS)POSTERIOR LUMBAR INTERBODY FUSION (PLIF) 2 LEVEL N/A 12/13/2015   Procedure: Lumbar two-three - Lumbar three-four MAXIMUM ACCESS (MAS) POSTERIOR LUMBAR INTERBODY FUSION (PLIF)  ;  Surgeon: Eustace Moore, MD;  Location: [facility] NEURO ORS;  Service: Neurosurgery;  Laterality: N/A;  . REVERSE SHOULDER ARTHROPLASTY Left 03/01/2020   Procedure: REVERSE SHOULDER  ARTHROPLASTY;  Surgeon: Corky Mull, MD;  Location: [facility] ORS;  Service: Orthopedics;  Laterality: Left;  . SHOULDER SURGERY     RIGHT   . TONSILLECTOMY      There were no vitals filed for this visit.   Subjective Assessment - 07/24/20 0851    Subjective L shoulder locked up a little ealier. Has not been doing her exercises due to having a migraine for a few days.    Pertinent History S/P L reverse total shoulder replacment. Did not know about only PROM for now. Had home health PT for 2 weeks which involved PROM shoulder abduction, flexion as well as isometric shoulder flexion and abduction.  Pt sleeps on her bed.  Next surgeon appointment is either first or second week of December around Thurday of Friday.  Pt also states  having ringing in her ears which started since the surgery.    Patient Stated Goals Raise her L arm better, be able to pick up things as well as work on crafts.    Currently in Pain? No/denies    Pain Onset More than a month ago                                     PT Education - 07/24/20 0929    Education provided Yes    Education Details ther-ex    [employer]) Educated Patient    Methods Explanation;Demonstration;Tactile cues;Verbal cues    Comprehension Returned demonstration;Verbalized understanding           Objective  No latex band allergies  03/27/2020: Blood pressure R arm sitting, mechanically taken, normal cuff: 170/80, HR 59  Last home health session was about over a week ago  L shoulder incisions: healing, scab formation with slight redness around the scabs.    MedbridgeAccess Code [facility]  Therapeutic exercise  L shoulder ER isometrics in neutral, PT manual resistance 10x3 with 5 second holds   Manually resisted L scapular retraction targeting lower trap 10x5 seconds for 2 sets  Seated manually resisted L elbow extension isometrics with elbow in about 80 dgrees flexion 10x3 with 5 second holds   Seated manually resisted L shoulder extension isometrics in neutral 10x3 with 5 second holds   seated L shoulder AROM with scapular retraction   scaption 10x2  Abduction 10x2  flexion 10x2  ER 10x2  Improved exercise technique, movement at target joints, use of target muscles after mod verbal, visual, tactile cues.   Manual therapy Seated STM B cervical paraspinal muscles, R and L upper trap muscles to decrease tension      Response to treatment Pt tolerated session well without aggravation of symptoms  Clinical impression  Worked on L UE strengthening and AROM to promote ability to raise her L arm up with less difficulty.  Difficulty with ROM in which stiffness plays a large factor. Pt tolerated  session well without aggravation of symptoms. Pt will benefit from continued skilled physical therapy services to improve strength, ROM, and function.      PT Short Term Goals - 06/28/20 0913      PT SHORT TERM GOAL #1   Title Pt will be independent with her initial HEP to improve ROM and ability to raise her arm with less difficulty when appropriate.    Baseline Pt has not been able to perform much of her HEP (05/07/2020); Has not been performing her HEP (06/28/2020)  Time 3    Period Weeks    Status On-going    Target Date 07/19/20             PT Long Term Goals - 06/28/20 0932      PT LONG TERM GOAL #1   Title Patient will improve L shoulder PROM flexion, and abduction AROM to at least 155 degrees, and ER to 80 degrees to promote ability to reach with less difficulty when appropriate.    Baseline PROM flexion 70 degrees, abduction 58 degrees, ER 5 degrees (scapular plane) 03/27/2020.; AAROM reclined: 125 degrees flexion, 95 degrees abduction, 22 degrees ER in scapular plane (05/07/20);L shoulder standing AROM: flexion 118 degrees, 115 degrees, ER (at about 90 degrees abduction): 43 degrees, IR (at about 90 degrees abduction): 60 degrees (06/21/2020)    Time 6    Period Weeks    Status Partially Met    Target Date 08/02/20      PT LONG TERM GOAL #2   Title Pt will have at least 145 degrees L shoulder flexion and abduction AROM, and 75 degrees ER AROM to promote ability to reach with less difficulty when appropriate.    Baseline AROM not yet performed (03/27/2020);AAROM reclined: 125 degrees flexion, 95 degrees abduction, 22 degrees ER in scapular plane (05/07/20);L shoulder standing AROM: flexion 118 degrees, 115 degrees, ER (at about 90 degrees abduction): 43 degrees, IR (at about 90 degrees abduction): 60 degrees    Time 6    Period Weeks    Status Partially Met    Target Date 08/02/20      PT LONG TERM GOAL #3   Title PT will improve her FOTO score by at least 20 points as a  demonstration of improved function.    Baseline Shoulder FOTO: 34 (03/27/2020); 50 (05/07/2020); 49 (06/07/2020)    Time 6    Period Weeks    Status Partially Met      PT LONG TERM GOAL #4   Title Pt will have at least 4+/5 MMT grade L shoulder flexion, abduction, ER and IR to promote ability to perform functional tasks.    Baseline Manual resistance not yet performed. (05/07/2020); L shoulder flexion 4/5, abduction 4+/5, ER 4/5, IR 4+/5   (06/21/2020)    Time 6    Period Weeks    Status Partially Met    Target Date 08/02/20                 Plan - 07/24/20 0930    Clinical Impression Statement Worked on L UE strengthening and AROM to promote ability to raise her L arm up with less difficulty.  Difficulty with ROM in which stiffness plays a large factor. Pt tolerated session well without aggravation of symptoms. Pt will benefit from continued skilled physical therapy services to improve strength, ROM, and function.    Personal Factors and Comorbidities Age;Comorbidity 3+;Fitness;Time since onset of injury/illness/exacerbation    Comorbidities anxiety, depression, DM, HTN    Examination-Activity Limitations Bathing;Hygiene/Grooming;Lift;Reach Overhead;Toileting;Carry    Stability/Clinical Decision Making Stable/Uncomplicated    Clinical Decision Making Low    Rehab Potential Fair    Clinical Impairments Affecting Rehab Potential Possible difficulty adhering to precautions. Pt needs constant reminder and cueing for PROM during the first few weeks of rehab.    PT Frequency 2x / week    PT Duration 6 weeks    PT Treatment/Interventions Therapeutic activities;Therapeutic exercise;Neuromuscular re-education;Patient/family education;Manual techniques;Passive range of motion;Dry needling;Vestibular;Canalith Repostioning;Electrical Stimulation;Iontophoresis 4mg /ml Dexamethasone  PT Next Visit Plan PROM, manual techniques, modalities PRN. [facility] Reverse Total  Shoulder Replacement Protocol unless otherwise noted by MD.    [place] Access Code [facility]    Consulted and Agree with Plan of Care Patient           Patient will benefit from skilled therapeutic intervention in order to improve the following deficits and impairments:  Pain,Postural dysfunction,Improper body mechanics,Decreased strength,Decreased range of motion  Visit Diagnosis: Stiffness of left shoulder, not elsewhere classified  Chronic left shoulder pain  Muscle weakness (generalized)     Problem List Patient Active Problem List   Diagnosis Date Noted  . 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 edema associated with type 2 diabetes mellitus ([place]) 01/03/2019  . Chronic cough 11/19/2016  . Morbid obesity ([place]) 09/26/2016  . Left ovarian cyst 05/30/2016  . S/P lumbar spinal fusion 12/13/2015  . Aortic calcification ([place]) 07/18/2015  . Controlled type 2 diabetes mellitus without complication ([place]) 58/34/6219  . Thrombocytopenia ([place]) 05/16/2015  . Recurrent major depressive disorder, in full remission ([place]) 05/16/2015  . Essential (primary) hypertension 05/16/2015  . Fatty infiltration of liver 03/15/2015  . Aortic valve stenosis, nonrheumatic 01/18/2015  . Sciatica of right side 01/09/2015  . Type 2 diabetes mellitus ([place]) 11/10/2014  . RLS (restless legs syndrome) 08/23/2014  . Neuritis or radiculitis due to rupture of lumbar intervertebral disc 06/13/2014  . Degeneration of intervertebral disc of lumbar region 06/13/2014  . Lumbar radiculitis 06/13/2014  . Arthritis 01/23/2014  . Arthritis of knee, degenerative 11/15/2013  . Depression 09/29/2013  . Insomnia 09/29/2013  . Apnea, sleep 08/29/2013  . History of nephrolithiasis 08/29/2013  . Abnormal presence of protein in urine 08/29/2013  . Headache, migraine 08/29/2013  . BP (high blood pressure) 08/29/2013  . HLD (hyperlipidemia) 08/29/2013  . Allergic  rhinitis 08/29/2013  . Intractable migraine without aura 02/18/2013    Joneen Boers PT, DPT   07/24/2020, 12:48 PM  [place] [facility] [address], [place], [address] Phone: [phone]   Fax:  [phone]  Name: Jessica Cole MRN: [id number] Date of Birth: [birth year]

## 2020-07-25 ENCOUNTER — Ambulatory Visit: Payer: Medicare HMO

## 2020-07-25 DIAGNOSIS — G8929 Other chronic pain: Secondary | ICD-10-CM

## 2020-07-25 DIAGNOSIS — M25612 Stiffness of left shoulder, not elsewhere classified: Secondary | ICD-10-CM

## 2020-07-25 DIAGNOSIS — M6281 Muscle weakness (generalized): Secondary | ICD-10-CM

## 2020-07-25 NOTE — Therapy (Signed)
[facility] [address] [place], [address] Phone: [phone]   Fax:  [phone]  Physical Therapy Treatment  Patient Details  Name: Jessica Cole MRN: [id number] Date of Birth: [birth year] Referring Provider (PT): Lattie Corns, [place]   Encounter Date: 07/25/2020   PT End of Session - 07/25/20 1330    Visit Number 27    Number of Visits 37    Date for PT Re-Evaluation 08/02/20    Authorization Type 7    Authorization Time Period of 10 progress report    PT Start Time 1330    PT Stop Time 1410    PT Time Calculation (min) 40 min    Activity Tolerance Patient tolerated treatment well    Behavior During Therapy [place] for tasks assessed/performed           Past Medical History:  Diagnosis Date  . Anxiety   . Asthma    seasonal with allergies  . Colon polyps   . Degenerative arthritis   . Depression   . Diabetes mellitus without complication ([place])    Type II  . Environmental allergies   . Family history of adverse reaction to anesthesia    sister- PONV  . Fatty liver   . Glaucoma   . Headache(784.0)    HX  MIGRAINES  . History of bronchitis   . History of kidney stones   . History of pneumonia   . Hypertension   . Hypothyroidism   . Obesity   . OSA on CPAP   . Plantar fasciitis    right  . PONV (postoperative nausea and vomiting)   . Renal calculi   . Renal calculi   . RLS (restless legs syndrome) 08/23/2014    Past Surgical History:  Procedure Laterality Date  . ABDOMINAL HYSTERECTOMY     partial  . APPENDECTOMY    . Arthroscopic surgery, knee Left   . BREAST BIOPSY Right    several  . BREAST BIOPSY Right 03/11/2017   u/s bx neg  . BUNIONECTOMY     LEFT   . COLONOSCOPY W/ POLYPECTOMY    . COLONOSCOPY WITH PROPOFOL N/A 08/14/2017   Procedure: COLONOSCOPY WITH PROPOFOL;  Surgeon: Lollie Sails, MD;  Location: [facility] ENDOSCOPY;  Service: Endoscopy;  Laterality: N/A;  .  EXTRACORPOREAL SHOCK WAVE LITHOTRIPSY Left 05/14/2017   Procedure: EXTRACORPOREAL SHOCK WAVE LITHOTRIPSY (ESWL);  Surgeon: Abbie Sons, MD;  Location: [facility] ORS;  Service: Urology;  Laterality: Left;  . EXTRACORPOREAL SHOCK WAVE LITHOTRIPSY Left 06/14/2020   Procedure: EXTRACORPOREAL SHOCK WAVE LITHOTRIPSY (ESWL);  Surgeon: Billey Co, MD;  Location: [facility] ORS;  Service: Urology;  Laterality: Left;  . FOOT SURGERY     RIGHT    . KNEE ARTHROSCOPY W/ MENISCAL REPAIR Left   . LASIK    . MAXIMUM ACCESS (MAS)POSTERIOR LUMBAR INTERBODY FUSION (PLIF) 1 LEVEL N/A 04/25/2016   Procedure: LUMBAR FOUR-FIVE  MAXIMUM ACCESS (MAS) POSTERIOR LUMBAR INTERBODY FUSION (PLIF) with extension of instrumentation LUMBAR TWO-FIVE;  Surgeon: Eustace Moore, MD;  Location: [place];  Service: Neurosurgery;  Laterality: N/A;  . MAXIMUM ACCESS (MAS)POSTERIOR LUMBAR INTERBODY FUSION (PLIF) 2 LEVEL N/A 12/13/2015   Procedure: Lumbar two-three - Lumbar three-four MAXIMUM ACCESS (MAS) POSTERIOR LUMBAR INTERBODY FUSION (PLIF)  ;  Surgeon: Eustace Moore, MD;  Location: [facility] NEURO ORS;  Service: Neurosurgery;  Laterality: N/A;  . REVERSE SHOULDER ARTHROPLASTY Left 03/01/2020   Procedure: REVERSE SHOULDER ARTHROPLASTY;  Surgeon: Corky Mull, MD;  Location: [facility] ORS;  Service: Orthopedics;  Laterality: Left;  . SHOULDER SURGERY     RIGHT   . TONSILLECTOMY      There were no vitals filed for this visit.   Subjective Assessment - 07/25/20 1331    Subjective Back has been hurting since yesterday. L shoulder feels fine. Able to reach behind her head.    Pertinent History S/P L reverse total shoulder replacment. Did not know about only PROM for now. Had home health PT for 2 weeks which involved PROM shoulder abduction, flexion as well as isometric shoulder flexion and abduction.  Pt sleeps on her bed.  Next surgeon appointment is either first or second week of December around Thurday of Friday.  Pt also states having ringing in her  ears which started since the surgery.    Patient Stated Goals Raise her L arm better, be able to pick up things as well as work on crafts.    Currently in Pain? No/denies   Non for L shoulder.   Pain Onset More than a month ago                                     PT Education - 07/25/20 1406    Education provided Yes    Education Details ther-ex    [employer]) Educated Patient    Methods Explanation;Demonstration;Tactile cues;Verbal cues    Comprehension Returned demonstration;Verbalized understanding          Objective  No latex band allergies  03/27/2020: Blood pressure R arm sitting, mechanically taken, normal cuff: 170/80, HR 59  Last home health session was about over a week ago  L shoulder incisions: healing, scab formation with slight redness around the scabs.    MedbridgeAccess Code [email or web address]  Therapeutic exercise  Sitting with stool under feet to promote posterior pelvic tilt  seatedL shoulder A/AROMwith PT flexion 10x3 scaption 10x3 Abduction 10x3 ER 10x3  L low back pain at times.    seated R hip extension isometrics to help decrease R lateral shift posture in sitting 10x5 seconds   Seated L triceps extension manually resisted with PT 10x3  Seated manually resisted shoulder Return from flexion 10x3 returng from scaption 10x3 Return from abduction 10x3  Seated manually resisted L shoulder ER with PT 10x3  Improved exercise technique, movement at target joints, use of target muscles after mod verbal, visual, tactile cues.   Manual therapy seated STM L lumbar paraspinal muscles to promote low back comfort for exercises  Decreased low back pain afterwards  Seated STM L + R upper trap muscle to decrease tension       Response to treatment Pt tolerated session well without aggravation of  symptoms  Clinical impression  L low back pain reported today. Worked on gentle trunk flexion as well as decreasing R latearl shift posture gently in sitting to improve comfort when performing L shoulder rehab. Continued working on L shoulder AAROM to decrease stiffness and improve ability to reach higher. Pt able to reach behind her head per subjective reports. Pt tolerated session well without aggravation of symptoms. Pt will benefit from continued skilled physical therapy services to improve ROM, strength, and function.      PT Short Term Goals - 06/28/20 0913      PT SHORT TERM GOAL #1   Title Pt will be independent with her initial HEP to  improve ROM and ability to raise her arm with less difficulty when appropriate.    Baseline Pt has not been able to perform much of her HEP (05/07/2020); Has not been performing her HEP (06/28/2020)    Time 3    Period Weeks    Status On-going    Target Date 07/19/20             PT Long Term Goals - 06/28/20 0932      PT LONG TERM GOAL #1   Title Patient will improve L shoulder PROM flexion, and abduction AROM to at least 155 degrees, and ER to 80 degrees to promote ability to reach with less difficulty when appropriate.    Baseline PROM flexion 70 degrees, abduction 58 degrees, ER 5 degrees (scapular plane) 03/27/2020.; AAROM reclined: 125 degrees flexion, 95 degrees abduction, 22 degrees ER in scapular plane (05/07/20);L shoulder standing AROM: flexion 118 degrees, 115 degrees, ER (at about 90 degrees abduction): 43 degrees, IR (at about 90 degrees abduction): 60 degrees (06/21/2020)    Time 6    Period Weeks    Status Partially Met    Target Date 08/02/20      PT LONG TERM GOAL #2   Title Pt will have at least 145 degrees L shoulder flexion and abduction AROM, and 75 degrees ER AROM to promote ability to reach with less difficulty when appropriate.    Baseline AROM not yet performed (03/27/2020);AAROM reclined: 125 degrees flexion, 95 degrees  abduction, 22 degrees ER in scapular plane (05/07/20);L shoulder standing AROM: flexion 118 degrees, 115 degrees, ER (at about 90 degrees abduction): 43 degrees, IR (at about 90 degrees abduction): 60 degrees    Time 6    Period Weeks    Status Partially Met    Target Date 08/02/20      PT LONG TERM GOAL #3   Title PT will improve her FOTO score by at least 20 points as a demonstration of improved function.    Baseline Shoulder FOTO: 34 (03/27/2020); 50 (05/07/2020); 49 (06/07/2020)    Time 6    Period Weeks    Status Partially Met      PT LONG TERM GOAL #4   Title Pt will have at least 4+/5 MMT grade L shoulder flexion, abduction, ER and IR to promote ability to perform functional tasks.    Baseline Manual resistance not yet performed. (05/07/2020); L shoulder flexion 4/5, abduction 4+/5, ER 4/5, IR 4+/5   (06/21/2020)    Time 6    Period Weeks    Status Partially Met    Target Date 08/02/20                 Plan - 07/25/20 1330    Clinical Impression Statement L low back pain reported today. Worked on gentle trunk flexion as well as decreasing R latearl shift posture gently in sitting to improve comfort when performing L shoulder rehab. Continued working on L shoulder AAROM to decrease stiffness and improve ability to reach higher. Pt able to reach behind her head per subjective reports. Pt tolerated session well without aggravation of symptoms. Pt will benefit from continued skilled physical therapy services to improve ROM, strength, and function.    Personal Factors and Comorbidities Age;Comorbidity 3+;Fitness;Time since onset of injury/illness/exacerbation    Comorbidities anxiety, depression, DM, HTN    Examination-Activity Limitations Bathing;Hygiene/Grooming;Lift;Reach Overhead;Toileting;Carry    Stability/Clinical Decision Making Stable/Uncomplicated    Rehab Potential Fair    Clinical Impairments Affecting Rehab Potential  Possible difficulty adhering to precautions. Pt needs  constant reminder and cueing for PROM during the first few weeks of rehab.    PT Frequency 2x / week    PT Duration 6 weeks    PT Treatment/Interventions Therapeutic activities;Therapeutic exercise;Neuromuscular re-education;Patient/family education;Manual techniques;Passive range of motion;Dry needling;Vestibular;Canalith Repostioning;Electrical Stimulation;Iontophoresis 11m/ml Dexamethasone    PT Next Visit Plan PROM, manual techniques, modalities PRN. URiverside Surgery Center IncReverse Total Shoulder Replacement Protocol unless otherwise noted by MD.    PCentervilleAccess Code [facility]ent[facility]   Consulted and Agree with Plan of Care Patient           Patient will benefit from skilled therapeutic intervention in order to improve the following deficits and impairments:  Pain,Postural dysfunction,Improper body mechanics,Decreased strength,Decreased range of motion  Visit Diagnosis: Stiffness of left shoulder, not elsewhere classified  Chronic left shoulder pain  Muscle weakness (generalized)     Problem List Patient Active Problem List   Diagnosis Date Noted  . 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 edema associated with type 2 diabetes mellitus ([place] 01/03/2019  . Chronic cough 11/19/2016  . Morbid obesity (HP[place]ib[place] 09/26/2016  . Left ovarian cyst 05/30/2016  . S/P lumbar spinal fusion 12/13/2015  . Aortic calcification ([place] 07/18/2015  . Controlled type 2 diabetes mellitus without complication (HS[place] 035/78/9784 . Thrombocytopenia ([place] 05/16/2015  . Recurrent major depressive disorder, in full remission (HH[place] 05/16/2015  . Essential (primary) hypertension 05/16/2015  . Fatty infiltration of liver 03/15/2015  . Aortic valve stenosis, nonrheumatic 01/18/2015  . Sciatica of right side 01/09/2015  . Type 2 diabetes mellitus ([place] 11/10/2014  . RLS (restless legs syndrome) 08/23/2014  . Neuritis or radiculitis due to  rupture of lumbar intervertebral disc 06/13/2014  . Degeneration of intervertebral disc of lumbar region 06/13/2014  . Lumbar radiculitis 06/13/2014  . Arthritis 01/23/2014  . Arthritis of knee, degenerative 11/15/2013  . Depression 09/29/2013  . Insomnia 09/29/2013  . Apnea, sleep 08/29/2013  . History of nephrolithiasis 08/29/2013  . Abnormal presence of protein in urine 08/29/2013  . Headache, migraine 08/29/2013  . BP (high blood pressure) 08/29/2013  . HLD (hyperlipidemia) 08/29/2013  . Allergic rhinitis 08/29/2013  . Intractable migraine without aura 02/18/2013    MJoneen BoersPT, DPT   07/25/2020, 3:25 PM  [facility] [address] [place] [id number]: [phone]  Fax:  [phone] Name: GJOANANN MIESMRN: [id number] of Birth: 7July 07, 1952

## 2020-07-28 ENCOUNTER — Other Ambulatory Visit: Payer: Self-pay | Admitting: Urology

## 2020-07-28 DIAGNOSIS — N200 Calculus of kidney: Secondary | ICD-10-CM

## 2020-07-31 ENCOUNTER — Other Ambulatory Visit: Payer: Self-pay

## 2020-07-31 ENCOUNTER — Ambulatory Visit: Payer: Medicare HMO

## 2020-07-31 DIAGNOSIS — M6281 Muscle weakness (generalized): Secondary | ICD-10-CM

## 2020-07-31 DIAGNOSIS — M25612 Stiffness of left shoulder, not elsewhere classified: Secondary | ICD-10-CM

## 2020-07-31 DIAGNOSIS — G8929 Other chronic pain: Secondary | ICD-10-CM

## 2020-07-31 DIAGNOSIS — M25512 Pain in left shoulder: Secondary | ICD-10-CM

## 2020-07-31 NOTE — Therapy (Signed)
[facility] [address], [place], [address] Phone: [phone]   Fax:  [phone]  Physical Therapy Treatment  Patient Details  Name: Jessica Cole MRN: [id number] Date of Birth: 22-May-1950 Referring Provider (PT): Lattie Corns, [place]   Encounter Date: 07/31/2020   PT End of Session - 07/31/20 0934    Visit Number 28    Number of Visits 37    Date for PT Re-Evaluation 08/02/20    Authorization Type 8    Authorization Time Period of 10 progress report    PT Start Time 0935    PT Stop Time 1015    PT Time Calculation (min) 40 min    Activity Tolerance Patient tolerated treatment well    Behavior During Therapy [facility] for tasks assessed/performed           Past Medical History:  Diagnosis Date  . Anxiety   . Asthma    seasonal with allergies  . Colon polyps   . Degenerative arthritis   . Depression   . Diabetes mellitus without complication ([place])    Type II  . Environmental allergies   . Family history of adverse reaction to anesthesia    sister- PONV  . Fatty liver   . Glaucoma   . Headache(784.0)    HX  MIGRAINES  . History of bronchitis   . History of kidney stones   . History of pneumonia   . Hypertension   . Hypothyroidism   . Obesity   . OSA on CPAP   . Plantar fasciitis    right  . PONV (postoperative nausea and vomiting)   . Renal calculi   . Renal calculi   . RLS (restless legs syndrome) 08/23/2014    Past Surgical History:  Procedure Laterality Date  . ABDOMINAL HYSTERECTOMY     partial  . APPENDECTOMY    . Arthroscopic surgery, knee Left   . BREAST BIOPSY Right    several  . BREAST BIOPSY Right 03/11/2017   u/s bx neg  . BUNIONECTOMY     LEFT   . COLONOSCOPY W/ POLYPECTOMY    . COLONOSCOPY WITH PROPOFOL N/A 08/14/2017   Procedure: COLONOSCOPY WITH PROPOFOL;  Surgeon: Lollie Sails, MD;  Location: [facility] ENDOSCOPY;  Service: Endoscopy;  Laterality: N/A;  .  EXTRACORPOREAL SHOCK WAVE LITHOTRIPSY Left 05/14/2017   Procedure: EXTRACORPOREAL SHOCK WAVE LITHOTRIPSY (ESWL);  Surgeon: Abbie Sons, MD;  Location: [facility] ORS;  Service: Urology;  Laterality: Left;  . EXTRACORPOREAL SHOCK WAVE LITHOTRIPSY Left 06/14/2020   Procedure: EXTRACORPOREAL SHOCK WAVE LITHOTRIPSY (ESWL);  Surgeon: Billey Co, MD;  Location: [facility] ORS;  Service: Urology;  Laterality: Left;  . FOOT SURGERY     RIGHT    . KNEE ARTHROSCOPY W/ MENISCAL REPAIR Left   . LASIK    . MAXIMUM ACCESS (MAS)POSTERIOR LUMBAR INTERBODY FUSION (PLIF) 1 LEVEL N/A 04/25/2016   Procedure: LUMBAR FOUR-FIVE  MAXIMUM ACCESS (MAS) POSTERIOR LUMBAR INTERBODY FUSION (PLIF) with extension of instrumentation LUMBAR TWO-FIVE;  Surgeon: Eustace Moore, MD;  Location: [place];  Service: Neurosurgery;  Laterality: N/A;  . MAXIMUM ACCESS (MAS)POSTERIOR LUMBAR INTERBODY FUSION (PLIF) 2 LEVEL N/A 12/13/2015   Procedure: Lumbar two-three - Lumbar three-four MAXIMUM ACCESS (MAS) POSTERIOR LUMBAR INTERBODY FUSION (PLIF)  ;  Surgeon: Eustace Moore, MD;  Location: [facility] NEURO ORS;  Service: Neurosurgery;  Laterality: N/A;  . REVERSE SHOULDER ARTHROPLASTY Left 03/01/2020   Procedure: REVERSE SHOULDER ARTHROPLASTY;  Surgeon: Corky Mull, MD;  Location: [facility] ORS;  Service: Orthopedics;  Laterality: Left;  . SHOULDER SURGERY     RIGHT   . TONSILLECTOMY      There were no vitals filed for this visit.   Subjective Assessment - 07/31/20 0936    Subjective Pt fell backwards last night and hit the back of her head. Pt landed onto her rear end. Pt backed up, her shoe got caught on one of the tiles on the floor (floring was not complete) and fell.  Has a slight headache currently, nothing crazy (front of her head). does not feel like she broke anything. Feels like she can participate in PT today, she'll try. 2/10 L shoulder pain currently (anterior proximal arm). Pt states trying to stay awake as long as she could yesterday after  the fall. Had a hard time sleeping due to husband watching TV at night as well.    Pertinent History S/P L reverse total shoulder replacment. Did not know about only PROM for now. Had home health PT for 2 weeks which involved PROM shoulder abduction, flexion as well as isometric shoulder flexion and abduction.  Pt sleeps on her bed.  Next surgeon appointment is either first or second week of December around Thurday of Friday.  Pt also states having ringing in her ears which started since the surgery.    Patient Stated Goals Raise her L arm better, be able to pick up things as well as work on crafts.    Currently in Pain? Yes    Pain Score 2     Pain Onset More than a month ago                                     PT Education - 07/31/20 1125    Education provided Yes    Education Details ther-ex    [employer]) Educated Patient    Methods Explanation;Demonstration;Tactile cues;Verbal cues    Comprehension Returned demonstration;Verbalized understanding           Objective  No latex band allergies  03/27/2020: Blood pressure R arm sitting, mechanically taken, normal cuff: 170/80, HR 59  Last home health session was about over a week ago  L shoulder incisions: healing, scab formation with slight redness around the scabs.    MedbridgeAccess Code [facility]  Therapeutic exercise Pt was recommended to contact her doctor if she feels like having increased symptoms from her fall yesterday. Pt verbalized understanding.   Seated B scapular retraction 10x5 seconds for 2 sets  Seated L elbow flexion gentle isometrics at 90 degrees flexion 10x5 seconds for 3 sets seated L elbow extension gentle isometrics at 90 degrees flexion 10x5 seconds for 3 sets  Pt was recommended to contact her doctor if her headaches (usually has headaches per pt) worsen secondary to her fall. Pt verbalized understanding.    Seated gentle manually resisted L shoulder flexion  isometrics in slight flexed position, resting arm at arm rest 10x5 seconds for 3 sets  No L arm pain at rest after aforementioned exercises.     Improved exercise technique, movement at target joints, use of target muscles after mod verbal, visual, tactile cues.   Manual therapy    Seated STM L + R upper trap muscle to decrease tension       Response to treatment Pt tolerated session well without aggravation of symptoms  Clinical impression  Light exercises performed today secondary to recent fall. Performed gentle L shoulder and elbow isometrics to promote gentle muscle activation. No L arm pain at rest after session. Still has soreness when raising her arm. Pt was recommended to call her surgeon to inform him about the fall and see if he wants to check her arm out. Pt verbalized understanding. Pt tolerated session well without aggravation of symptoms. Pt will benefit from continued skilled physical therapy services to decrease pain, improve strength and function.      PT Short Term Goals - 06/28/20 0913      PT SHORT TERM GOAL #1   Title Pt will be independent with her initial HEP to improve ROM and ability to raise her arm with less difficulty when appropriate.    Baseline Pt has not been able to perform much of her HEP (05/07/2020); Has not been performing her HEP (06/28/2020)    Time 3    Period Weeks    Status On-going    Target Date 07/19/20             PT Long Term Goals - 06/28/20 0932      PT LONG TERM GOAL #1   Title Patient will improve L shoulder PROM flexion, and abduction AROM to at least 155 degrees, and ER to 80 degrees to promote ability to reach with less difficulty when appropriate.    Baseline PROM flexion 70 degrees, abduction 58 degrees, ER 5 degrees (scapular plane) 03/27/2020.; AAROM reclined: 125 degrees flexion, 95 degrees abduction, 22 degrees ER in scapular plane (05/07/20);L shoulder standing AROM: flexion 118 degrees, 115  degrees, ER (at about 90 degrees abduction): 43 degrees, IR (at about 90 degrees abduction): 60 degrees (06/21/2020)    Time 6    Period Weeks    Status Partially Met    Target Date 08/02/20      PT LONG TERM GOAL #2   Title Pt will have at least 145 degrees L shoulder flexion and abduction AROM, and 75 degrees ER AROM to promote ability to reach with less difficulty when appropriate.    Baseline AROM not yet performed (03/27/2020);AAROM reclined: 125 degrees flexion, 95 degrees abduction, 22 degrees ER in scapular plane (05/07/20);L shoulder standing AROM: flexion 118 degrees, 115 degrees, ER (at about 90 degrees abduction): 43 degrees, IR (at about 90 degrees abduction): 60 degrees    Time 6    Period Weeks    Status Partially Met    Target Date 08/02/20      PT LONG TERM GOAL #3   Title PT will improve her FOTO score by at least 20 points as a demonstration of improved function.    Baseline Shoulder FOTO: 34 (03/27/2020); 50 (05/07/2020); 49 (06/07/2020)    Time 6    Period Weeks    Status Partially Met      PT LONG TERM GOAL #4   Title Pt will have at least 4+/5 MMT grade L shoulder flexion, abduction, ER and IR to promote ability to perform functional tasks.    Baseline Manual resistance not yet performed. (05/07/2020); L shoulder flexion 4/5, abduction 4+/5, ER 4/5, IR 4+/5   (06/21/2020)    Time 6    Period Weeks    Status Partially Met    Target Date 08/02/20                 Plan - 07/31/20 1125    Clinical Impression Statement Light exercises performed today secondary to recent fall.  Performed gentle L shoulder and elbow isometrics to promote gentle muscle activation. No L arm pain at rest after session. Still has soreness when raising her arm. Pt was recommended to call her surgeon to inform him about the fall and see if he wants to check her arm out. Pt verbalized understanding. Pt tolerated session well without aggravation of symptoms. Pt will benefit from continued skilled  physical therapy services to decrease pain, improve strength and function.    Personal Factors and Comorbidities Age;Comorbidity 3+;Fitness;Time since onset of injury/illness/exacerbation    Comorbidities anxiety, depression, DM, HTN    Examination-Activity Limitations Bathing;Hygiene/Grooming;Lift;Reach Overhead;Toileting;Carry    Stability/Clinical Decision Making Stable/Uncomplicated    Rehab Potential Fair    Clinical Impairments Affecting Rehab Potential Possible difficulty adhering to precautions. Pt needs constant reminder and cueing for PROM during the first few weeks of rehab.    PT Frequency 2x / week    PT Duration 6 weeks    PT Treatment/Interventions Therapeutic activities;Therapeutic exercise;Neuromuscular re-education;Patient/family education;Manual techniques;Passive range of motion;Dry needling;Vestibular;Canalith Repostioning;Electrical Stimulation;Iontophoresis 33m/ml Dexamethasone    PT Next Visit Plan PROM, manual techniques, modalities PRN. USt [facility] PaReverse Total Shoulder Replacement Protocol unless otherwise noted by MD.    PForest GroveAccess Code [facility]   Consulted and Agree with Plan of Care Patient           Patient will benefit from skilled therapeutic intervention in order to improve the following deficits and impairments:  Pain,Postural dysfunction,Improper body mechanics,Decreased strength,Decreased range of motion  Visit Diagnosis: Stiffness of left shoulder, not elsewhere classified  Chronic left shoulder pain  Muscle weakness (generalized)     Problem List Patient Active Problem List   Diagnosis Date Noted  . 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 edema associated with type 2 diabetes mellitus ([place] 01/03/2019  . Chronic cough 11/19/2016  . Morbid obesity (HD[place] 09/26/2016  . Left ovarian cyst 05/30/2016  . S/P lumbar spinal fusion 12/13/2015  . Aortic  calcification ([place] 07/18/2015  . Controlled type 2 diabetes mellitus without complication (HConrad 054/49/2010 . Thrombocytopenia ([place]ama City [place] 05/16/2015  . Recurrent major depressive disorder, in full remission ([place] 05/16/2015  . Essential (primary) hypertension 05/16/2015  . Fatty infiltration of liver 03/15/2015  . Aortic valve stenosis, nonrheumatic 01/18/2015  . Sciatica of right side 01/09/2015  . Type 2 diabetes mellitus (HM[place] 11/10/2014  . RLS (restless legs syndrome) 08/23/2014  . Neuritis or radiculitis due to rupture of lumbar intervertebral disc 06/13/2014  . Degeneration of intervertebral disc of lumbar region 06/13/2014  . Lumbar radiculitis 06/13/2014  . Arthritis 01/23/2014  . Arthritis of knee, degenerative 11/15/2013  . Depression 09/29/2013  . Insomnia 09/29/2013  . Apnea, sleep 08/29/2013  . History of nephrolithiasis 08/29/2013  . Abnormal presence of protein in urine 08/29/2013  . Headache, migraine 08/29/2013  . BP (high blood pressure) 08/29/2013  . HLD (hyperlipidemia) 08/29/2013  . Allergic rhinitis 08/29/2013  . Intractable migraine without aura 02/18/2013    MJoneen BoersPT, DPT   07/31/2020, 11:29 AM  [facility] [address] [place] [address]Phone: [phone]  Fax:  [phone] Name: GSHAWNTAE LOWYMRN: [id number] of Birth: 707/29/1952

## 2020-08-01 ENCOUNTER — Ambulatory Visit: Payer: Medicare HMO

## 2020-08-02 ENCOUNTER — Other Ambulatory Visit: Payer: Self-pay

## 2020-08-02 ENCOUNTER — Ambulatory Visit: Payer: Medicare HMO

## 2020-08-02 DIAGNOSIS — M25612 Stiffness of left shoulder, not elsewhere classified: Secondary | ICD-10-CM

## 2020-08-02 DIAGNOSIS — M6281 Muscle weakness (generalized): Secondary | ICD-10-CM

## 2020-08-02 DIAGNOSIS — G8929 Other chronic pain: Secondary | ICD-10-CM

## 2020-08-02 NOTE — Therapy (Signed)
[facility] [address] [place], [address] Phone: [phone]   Fax:  [phone]  Physical Therapy Treatment And Discharge Summary  Patient Details  Name: Jessica Cole MRN: [id number] Date of Birth: 03-21-1951 Referring Provider (PT): Lattie Corns, [place]   Encounter Date: 08/02/2020   PT End of Session - 08/02/20 1644    Visit Number 29    Number of Visits 37    Date for PT Re-Evaluation 08/02/20    Authorization Type 9    Authorization Time Period of 10 progress report    PT Start Time 1633    PT Stop Time 1713    PT Time Calculation (min) 40 min    Activity Tolerance Patient tolerated treatment well    Behavior During Therapy [facility] for tasks assessed/performed           Past Medical History:  Diagnosis Date  . Anxiety   . Asthma    seasonal with allergies  . Colon polyps   . Degenerative arthritis   . Depression   . Diabetes mellitus without complication ([place])    Type II  . Environmental allergies   . Family history of adverse reaction to anesthesia    sister- PONV  . Fatty liver   . Glaucoma   . Headache(784.0)    HX  MIGRAINES  . History of bronchitis   . History of kidney stones   . History of pneumonia   . Hypertension   . Hypothyroidism   . Obesity   . OSA on CPAP   . Plantar fasciitis    right  . PONV (postoperative nausea and vomiting)   . Renal calculi   . Renal calculi   . RLS (restless legs syndrome) 08/23/2014    Past Surgical History:  Procedure Laterality Date  . ABDOMINAL HYSTERECTOMY     partial  . APPENDECTOMY    . Arthroscopic surgery, knee Left   . BREAST BIOPSY Right    several  . BREAST BIOPSY Right 03/11/2017   u/s bx neg  . BUNIONECTOMY     LEFT   . COLONOSCOPY W/ POLYPECTOMY    . COLONOSCOPY WITH PROPOFOL N/A 08/14/2017   Procedure: COLONOSCOPY WITH PROPOFOL;  Surgeon: Lollie Sails, MD;  Location: [facility] ENDOSCOPY;  Service:  Endoscopy;  Laterality: N/A;  . EXTRACORPOREAL SHOCK WAVE LITHOTRIPSY Left 05/14/2017   Procedure: EXTRACORPOREAL SHOCK WAVE LITHOTRIPSY (ESWL);  Surgeon: Abbie Sons, MD;  Location: [facility] ORS;  Service: Urology;  Laterality: Left;  . EXTRACORPOREAL SHOCK WAVE LITHOTRIPSY Left 06/14/2020   Procedure: EXTRACORPOREAL SHOCK WAVE LITHOTRIPSY (ESWL);  Surgeon: Billey Co, MD;  Location: [facility] ORS;  Service: Urology;  Laterality: Left;  . FOOT SURGERY     RIGHT    . KNEE ARTHROSCOPY W/ MENISCAL REPAIR Left   . LASIK    . MAXIMUM ACCESS (MAS)POSTERIOR LUMBAR INTERBODY FUSION (PLIF) 1 LEVEL N/A 04/25/2016   Procedure: LUMBAR FOUR-FIVE  MAXIMUM ACCESS (MAS) POSTERIOR LUMBAR INTERBODY FUSION (PLIF) with extension of instrumentation LUMBAR TWO-FIVE;  Surgeon: Eustace Moore, MD;  Location: [place];  Service: Neurosurgery;  Laterality: N/A;  . MAXIMUM ACCESS (MAS)POSTERIOR LUMBAR INTERBODY FUSION (PLIF) 2 LEVEL N/A 12/13/2015   Procedure: Lumbar two-three - Lumbar three-four MAXIMUM ACCESS (MAS) POSTERIOR LUMBAR INTERBODY FUSION (PLIF)  ;  Surgeon: Eustace Moore, MD;  Location: [facility] NEURO ORS;  Service: Neurosurgery;  Laterality: N/A;  . REVERSE SHOULDER ARTHROPLASTY Left 03/01/2020   Procedure:  REVERSE SHOULDER ARTHROPLASTY;  Surgeon: Corky Mull, MD;  Location: [facility] ORS;  Service: Orthopedics;  Laterality: Left;  . SHOULDER SURGERY     RIGHT   . TONSILLECTOMY      There were no vitals filed for this visit.   Subjective Assessment - 08/02/20 1634    Subjective L shoulder is the only thing that is not hurting. Called Dr. Roland Rack about her shoulder who told her to ice her shoulder and to lessen the activites. 0/10 L shoulder. Feels like she can continue with her HEP and graduate PT today. Has another follow up appointment wiht Dr. Roland Rack the last week of April 2022. The side that is bothering her is her R side.    Pertinent History S/P L reverse total shoulder replacment. Did not know about only PROM for  now. Had home health PT for 2 weeks which involved PROM shoulder abduction, flexion as well as isometric shoulder flexion and abduction.  Pt sleeps on her bed.  Next surgeon appointment is either first or second week of December around Thurday of Friday.  Pt also states having ringing in her ears which started since the surgery.    Patient Stated Goals Raise her L arm better, be able to pick up things as well as work on crafts.    Currently in Pain? No/denies   No pain for L shoulder. Hurts everywhere else   Pain Score 0-No pain    Pain Onset More than a month ago                                     PT Education - 08/02/20 1737    Education provided Yes    Education Details ther-ex    [employer]) Educated Patient    Methods Explanation;Demonstration;Tactile cues;Verbal cues    Comprehension Returned demonstration;Verbalized understanding          Objective  No latex band allergies  03/27/2020: Blood pressure R arm sitting, mechanically taken, normal cuff: 170/80, HR 59  Last home health session was about over a week ago  L shoulder incisions: healing, scab formation with slight redness around the scabs.    MedbridgeAccess Code [email or web address]  Locking sensation is much less per pt 08/02/2020  Therapeutic exercise  AROM L shoulder  Flexion 120 degrees, abduction 116 degrees, ER 36 degrees, scapular plane (08/02/2020)  Manually resisted L shoulder flexion 4+/5, abduction 5/5, ER 4+/5, IR 5/5 (08/02/2020)  Reviewed progress/current status with PT towards goals  L shoulder ER isometrics in neutral, PT manual resistance 10x3 with 5 second holds   seated L shoulder AROM with scapular retraction              flexion 10x  scaption 10x             Abduction 5x              Improved exercise technique, movement at target joints, use of target muscles after mod verbal, visual, tactile cues.   Manual therapy  Seated STM B cervical  paraspinal muscles, R and L upper trap muscles to decrease tension      Response to treatment Pt tolerated session well without aggravation of symptoms  Clinical impression  Pt demonstrates overall improved L shoulder AROM, as well as strength and significant improvement in function based on her FOTO score since her initial evaluation. Pt has made progress with PT towards goals. Skilled  PT services discharged with pt continuing with her exercises at home.           PT Short Term Goals - 06/28/20 0913      PT SHORT TERM GOAL #1   Title Pt will be independent with her initial HEP to improve ROM and ability to raise her arm with less difficulty when appropriate.    Baseline Pt has not been able to perform much of her HEP (05/07/2020); Has not been performing her HEP (06/28/2020)    Time 3    Period Weeks    Status On-going    Target Date 07/19/20             PT Long Term Goals - 08/02/20 1638      PT LONG TERM GOAL #1   Title Patient will improve L shoulder PROM flexion, and abduction AROM to at least 155 degrees, and ER to 80 degrees to promote ability to reach with less difficulty when appropriate.    Baseline PROM flexion 70 degrees, abduction 58 degrees, ER 5 degrees (scapular plane) 03/27/2020.; AAROM reclined: 125 degrees flexion, 95 degrees abduction, 22 degrees ER in scapular plane (05/07/20);L shoulder standing AROM: flexion 118 degrees, 115 degrees, ER (at about 90 degrees abduction): 43 degrees, IR (at about 90 degrees abduction): 60 degrees (06/21/2020); AROM Flexion 120 degrees, abduction 116 degrees , ER 36 degrees, scapular plane(08/02/2020)    Time 6    Period Weeks    Status Partially Met    Target Date 08/02/20      PT LONG TERM GOAL #2   Title Pt will have at least 145 degrees L shoulder flexion and abduction AROM, and 75 degrees ER AROM to promote ability to reach with less difficulty when appropriate.    Baseline AROM not yet performed (03/27/2020);AAROM  reclined: 125 degrees flexion, 95 degrees abduction, 22 degrees ER in scapular plane (05/07/20);L shoulder standing AROM: flexion 118 degrees, 115 degrees, ER (at about 90 degrees abduction): 43 degrees, IR (at about 90 degrees abduction): 60 degrees;Flexion 120 degrees, abduction 116 degrees, ER 36 degrees, scapular plane (08/02/2020)    Time 6    Period Weeks    Status Partially Met    Target Date 08/02/20      PT LONG TERM GOAL #3   Title PT will improve her FOTO score by at least 20 points as a demonstration of improved function.    Baseline Shoulder FOTO: 34 (03/27/2020); 50 (05/07/2020); 49 (06/07/2020); 70 (08/02/2020)    Time 6    Period Weeks    Status Achieved    Target Date 08/02/20      PT LONG TERM GOAL #4   Title Pt will have at least 4+/5 MMT grade L shoulder flexion, abduction, ER and IR to promote ability to perform functional tasks.    Baseline Manual resistance not yet performed. (05/07/2020); L shoulder flexion 4/5, abduction 4+/5, ER 4/5, IR 4+/5   (06/21/2020);L shoulder flexion 4+/5, abduction 5/5, ER 4+/5, IR 5/5 (08/02/2020)    Time 6    Period Weeks    Status Achieved    Target Date 08/02/20                 Plan - 08/02/20 1632    Clinical Impression Statement Pt demonstrates overall improved L shoulder AROM, as well as strength and significant improvement in function based on her FOTO score since her initial evaluation. Pt has made progress with PT towards goals. Skilled PT services discharged  with pt continuing with her exercises at home.    Personal Factors and Comorbidities Age;Comorbidity 3+;Fitness;Time since onset of injury/illness/exacerbation    Comorbidities anxiety, depression, DM, HTN    Examination-Activity Limitations Bathing;Hygiene/Grooming;Lift;Reach Overhead;Toileting;Carry    Stability/Clinical Decision Making --    Rehab Potential --    Clinical Impairments Affecting Rehab Potential --    PT Frequency --    PT Duration --    PT  Treatment/Interventions Therapeutic activities;Therapeutic exercise;Neuromuscular re-education;Patient/family education;Manual techniques;Passive range of motion    PT Next Visit Plan Continue progress with her exercises at home    PT [place] Access Code [facility]    Consulted and Agree with Plan of Care Patient           Patient will benefit from skilled therapeutic intervention in order to improve the following deficits and impairments:  Pain,Postural dysfunction,Improper body mechanics,Decreased strength,Decreased range of motion  Visit Diagnosis: Stiffness of left shoulder, not elsewhere classified  Chronic left shoulder pain  Muscle weakness (generalized)     Problem List Patient Active Problem List   Diagnosis Date Noted  . 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 edema associated with type 2 diabetes mellitus ([place]) 01/03/2019  . Chronic cough 11/19/2016  . Morbid obesity ([place]) 09/26/2016  . Left ovarian cyst 05/30/2016  . S/P lumbar spinal fusion 12/13/2015  . Aortic calcification ([place]) 07/18/2015  . Controlled type 2 diabetes mellitus without complication ([place]) 81/19/1478  . Thrombocytopenia ([place]) 05/16/2015  . Recurrent major depressive disorder, in full remission ([place]) 05/16/2015  . Essential (primary) hypertension 05/16/2015  . Fatty infiltration of liver 03/15/2015  . Aortic valve stenosis, nonrheumatic 01/18/2015  . Sciatica of right side 01/09/2015  . Type 2 diabetes mellitus ([place]) 11/10/2014  . RLS (restless legs syndrome) 08/23/2014  . Neuritis or radiculitis due to rupture of lumbar intervertebral disc 06/13/2014  . Degeneration of intervertebral disc of lumbar region 06/13/2014  . Lumbar radiculitis 06/13/2014  . Arthritis 01/23/2014  . Arthritis of knee, degenerative 11/15/2013  . Depression 09/29/2013  . Insomnia 09/29/2013  . Apnea, sleep 08/29/2013  . History of nephrolithiasis 08/29/2013  .  Abnormal presence of protein in urine 08/29/2013  . Headache, migraine 08/29/2013  . BP (high blood pressure) 08/29/2013  . HLD (hyperlipidemia) 08/29/2013  . Allergic rhinitis 08/29/2013  . Intractable migraine without aura 02/18/2013    Thank you for your referral.   Joneen Boers PT, DPT   08/02/2020, 5:45 PM  [facility] [address], [place], [address] Phone: [phone]   Fax:  [phone]  Name: Jessica Cole MRN: [id number] Date of Birth: 1950-10-11

## 2020-08-08 ENCOUNTER — Other Ambulatory Visit: Payer: Self-pay | Admitting: Neurology

## 2020-08-29 ENCOUNTER — Other Ambulatory Visit: Payer: Self-pay | Admitting: Urology

## 2020-08-29 DIAGNOSIS — N200 Calculus of kidney: Secondary | ICD-10-CM

## 2020-09-04 ENCOUNTER — Other Ambulatory Visit: Payer: Self-pay | Admitting: Rheumatology

## 2020-09-04 DIAGNOSIS — M79641 Pain in right hand: Secondary | ICD-10-CM

## 2020-09-05 ENCOUNTER — Other Ambulatory Visit: Payer: Self-pay | Admitting: Neurology

## 2020-09-17 ENCOUNTER — Ambulatory Visit
Admission: RE | Admit: 2020-09-17 | Discharge: 2020-09-17 | Disposition: A | Discharge status: Home / Self Care | Payer: Medicare HMO | Source: Ambulatory Visit | Attending: Rheumatology | Admitting: Rheumatology

## 2020-09-17 ENCOUNTER — Other Ambulatory Visit: Payer: Self-pay

## 2020-09-17 DIAGNOSIS — M79641 Pain in right hand: Secondary | ICD-10-CM | POA: Insufficient documentation

## 2020-10-02 ENCOUNTER — Other Ambulatory Visit: Payer: Self-pay | Admitting: Neurology

## 2020-10-06 ENCOUNTER — Other Ambulatory Visit: Payer: Self-pay | Admitting: Neurology

## 2020-10-09 ENCOUNTER — Telehealth: Payer: Self-pay | Admitting: Emergency Medicine

## 2020-10-09 NOTE — Telephone Encounter (Signed)
Called patient and offered to make follow up visit.  She stated she is establishing care with neurologist closer to home.  She will contact her PCP for a future refills.

## 2020-10-09 NOTE — Telephone Encounter (Signed)
-----   Message from York Spaniel, MD sent at 10/08/2020  5:05 PM EDT ----- This patient will need a revisit, nonurgent, may see nurse practitioner.

## 2020-10-09 NOTE — Telephone Encounter (Signed)
LVM for patient to return call.  If patient calls back, please have them schedule follow up visit for Dr. Anne Hahn or Maralyn Sago.

## 2020-10-10 ENCOUNTER — Other Ambulatory Visit: Payer: Self-pay | Admitting: Urology

## 2020-10-10 DIAGNOSIS — N200 Calculus of kidney: Secondary | ICD-10-CM

## 2020-10-24 ENCOUNTER — Other Ambulatory Visit (HOSPITAL_COMMUNITY): Payer: Self-pay | Admitting: Neurology

## 2020-10-24 ENCOUNTER — Other Ambulatory Visit: Payer: Self-pay | Admitting: Neurology

## 2020-10-24 DIAGNOSIS — G3184 Mild cognitive impairment, so stated: Secondary | ICD-10-CM

## 2020-11-03 ENCOUNTER — Other Ambulatory Visit: Payer: Self-pay | Admitting: Urology

## 2020-11-03 ENCOUNTER — Other Ambulatory Visit: Payer: Self-pay | Admitting: Neurology

## 2020-11-03 DIAGNOSIS — N200 Calculus of kidney: Secondary | ICD-10-CM

## 2020-11-04 ENCOUNTER — Emergency Department
Admission: EM | Admit: 2020-11-04 | Discharge: 2020-11-04 | Disposition: A | Discharge status: Home / Self Care | Payer: Medicare HMO | Attending: Emergency Medicine | Admitting: Emergency Medicine

## 2020-11-04 ENCOUNTER — Emergency Department: Payer: Medicare HMO

## 2020-11-04 ENCOUNTER — Encounter: Payer: Self-pay | Admitting: Emergency Medicine

## 2020-11-04 ENCOUNTER — Other Ambulatory Visit: Payer: Self-pay

## 2020-11-04 DIAGNOSIS — Z7951 Long term (current) use of inhaled steroids: Secondary | ICD-10-CM | POA: Insufficient documentation

## 2020-11-04 DIAGNOSIS — M5441 Lumbago with sciatica, right side: Secondary | ICD-10-CM | POA: Diagnosis not present

## 2020-11-04 DIAGNOSIS — Z7982 Long term (current) use of aspirin: Secondary | ICD-10-CM | POA: Insufficient documentation

## 2020-11-04 DIAGNOSIS — E039 Hypothyroidism, unspecified: Secondary | ICD-10-CM | POA: Insufficient documentation

## 2020-11-04 DIAGNOSIS — Z7984 Long term (current) use of oral hypoglycemic drugs: Secondary | ICD-10-CM | POA: Insufficient documentation

## 2020-11-04 DIAGNOSIS — J45909 Unspecified asthma, uncomplicated: Secondary | ICD-10-CM | POA: Diagnosis not present

## 2020-11-04 DIAGNOSIS — G8929 Other chronic pain: Secondary | ICD-10-CM | POA: Insufficient documentation

## 2020-11-04 DIAGNOSIS — M545 Low back pain, unspecified: Secondary | ICD-10-CM | POA: Diagnosis present

## 2020-11-04 DIAGNOSIS — M79661 Pain in right lower leg: Secondary | ICD-10-CM | POA: Insufficient documentation

## 2020-11-04 DIAGNOSIS — M79604 Pain in right leg: Secondary | ICD-10-CM | POA: Diagnosis not present

## 2020-11-04 DIAGNOSIS — I10 Essential (primary) hypertension: Secondary | ICD-10-CM | POA: Diagnosis not present

## 2020-11-04 DIAGNOSIS — E119 Type 2 diabetes mellitus without complications: Secondary | ICD-10-CM | POA: Diagnosis not present

## 2020-11-04 HISTORY — DX: Rheumatoid arthritis, unspecified: M06.9

## 2020-11-04 MED ORDER — DEXAMETHASONE SODIUM PHOSPHATE 10 MG/ML IJ SOLN
10.0000 mg | Freq: Once | INTRAMUSCULAR | Status: AC
  Administered 2020-11-04: 10 mg via INTRAMUSCULAR
  Filled 2020-11-04: qty 1

## 2020-11-04 MED ORDER — CYCLOBENZAPRINE HCL 5 MG PO TABS: 5.0000 mg | ORAL_TABLET | Freq: Three times a day (TID) | ORAL | 0 refills | Status: DC | PRN

## 2020-11-04 MED ORDER — HYDROCODONE-ACETAMINOPHEN 5-325 MG PO TABS: 1.0000 | ORAL_TABLET | Freq: Three times a day (TID) | ORAL | 0 refills | Status: DC | PRN

## 2020-11-04 NOTE — ED Provider Notes (Addendum)
[facility] Emergency Department Provider Note ____________________________________________  Time seen: 1115  I have reviewed the triage vital signs and the nursing notes.  HISTORY  Chief Complaint  Leg Pain   HPI Jessica Cole is a 70 y.o. female presents to the ER today with complaint of right-sided low back pain.  She reports this has been a chronic issue for her but worse in the last 3 days.  She describes the pain as sharp and stabbing.  The pain radiates down her right leg into the back of her right calf.  The pain is worse with standing and walking.  She reports the pain is improved with sitting and laying down as long as she does not sit or lay down for too long.  She denies any numbness or tingling in her right lower extremity but has had some weakness.  She reports she almost fell at home secondary to the pain.  She denies any issues with loss of bowel or bladder control.  She has a history of lumbar radiculitis, MRI from 2020 showed:  T10-11 and T11-12 are imaged in the sagittal plane only. Disc protrusions are seen at both levels, larger at T10-11 but the central canal and foramina appear open.   T12-L1: Mild disc bulge without stenosis.   L1-2: Minimal disc bulge and mild facet degenerative disease. No stenosis.   L2-L5: Status post decompression and fusion. The central canal and foramina are open at each level.   L5-S1: Shallow disc bulge. No stenosis.  She has had back surgeries x2 by Dr. Yetta BarreJones in [place].  She has recently been getting back injections by Dr. Yves Dillhasnis.  She has taken Tramadol with minimal relief of symptoms.  Past Medical History:  Diagnosis Date   Anxiety    Asthma    seasonal with allergies   Colon polyps    Degenerative arthritis    Depression    Diabetes mellitus without complication (HCC)    Type II   Environmental allergies    Family history of adverse reaction to anesthesia    sister- PONV   Fatty liver     Glaucoma    Headache(784.0)    HX  MIGRAINES   History of bronchitis    History of kidney stones    History of pneumonia    Hypertension    Hypothyroidism    Obesity    OSA on CPAP    Plantar fasciitis    right   PONV (postoperative nausea and vomiting)    Renal calculi    Renal calculi    Rheumatoid arthritis (HCC)    RLS (restless legs syndrome) 08/23/2014    Patient Active Problem List   Diagnosis Date Noted   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) 01/03/2019   Chronic cough 11/19/2016   Morbid obesity (HCC) 09/26/2016   Left ovarian cyst 05/30/2016   S/P lumbar spinal fusion 12/13/2015   Aortic calcification (HCC) 07/18/2015   Controlled type 2 diabetes mellitus without complication (HCC) 05/16/2015   Thrombocytopenia (HCC) 05/16/2015   Recurrent major depressive disorder, in full remission (HCC) 05/16/2015   Essential (primary) hypertension 05/16/2015   Fatty infiltration of liver 03/15/2015   Aortic valve stenosis, nonrheumatic 01/18/2015   Sciatica of right side 01/09/2015   Type 2 diabetes mellitus (HCC) 11/10/2014   RLS (restless legs syndrome) 08/23/2014   Neuritis or radiculitis due to rupture of lumbar intervertebral disc 06/13/2014  Degeneration of intervertebral disc of lumbar region 06/13/2014   Lumbar radiculitis 06/13/2014   Arthritis 01/23/2014   Arthritis of knee, degenerative 11/15/2013   Depression 09/29/2013   Insomnia 09/29/2013   Apnea, sleep 08/29/2013   History of nephrolithiasis 08/29/2013   Abnormal presence of protein in urine 08/29/2013   Headache, migraine 08/29/2013   BP (high blood pressure) 08/29/2013   HLD (hyperlipidemia) 08/29/2013   Allergic rhinitis 08/29/2013   Intractable migraine without aura 02/18/2013    Past Surgical History:  Procedure Laterality Date   ABDOMINAL HYSTERECTOMY     partial   APPENDECTOMY     Arthroscopic surgery,  knee Left    BREAST BIOPSY Right    several   BREAST BIOPSY Right 03/11/2017   u/s bx neg   BUNIONECTOMY     LEFT    COLONOSCOPY W/ POLYPECTOMY     COLONOSCOPY WITH PROPOFOL N/A 08/14/2017   Procedure: COLONOSCOPY WITH PROPOFOL;  Surgeon: Christena Deem, MD;  Location[facility] ENDOSCOPY;  Service: Endoscopy;  Laterality: N/A;   EXTRACORPOREAL SHOCK WAVE LITHOTRIPSY Left 05/14/2017   Procedure: EXTRACORPOREAL SHOCK WAVE LITHOTRIPSY (ESWL);  Surgeon: Riki Altes, MD;  Location: [facility] ORS;  Service: Urology;  Laterality: Left;   EXTRACORPOREAL SHOCK WAVE LITHOTRIPSY Left 06/14/2020   Procedure: EXTRACORPOREAL SHOCK WAVE LITHOTRIPSY (ESWL);  Surgeon: Sondra Come, MD;  Location: [facility] ORS;  Service: Urology;  Laterality: Left;   FOOT SURGERY     RIGHT     KNEE ARTHROSCOPY W/ MENISCAL REPAIR Left    LASIK     MAXIMUM ACCESS (MAS)POSTERIOR LUMBAR INTERBODY FUSION (PLIF) 1 LEVEL N/A 04/25/2016   Procedure: LUMBAR FOUR-FIVE  MAXIMUM ACCESS (MAS) POSTERIOR LUMBAR INTERBODY FUSION (PLIF) with extension of instrumentation LUMBAR TWO-FIVE;  Surgeon: Tia Alert, MD;  Location: [facility] OR;  Service: Neurosurgery;  Laterality: N/A;   MAXIMUM ACCESS (MAS)POSTERIOR LUMBAR INTERBODY FUSION (PLIF) 2 LEVEL N/A 12/13/2015   Procedure: Lumbar two-three - Lumbar three-four MAXIMUM ACCESS (MAS) POSTERIOR LUMBAR INTERBODY FUSION (PLIF)  ;  Surgeon: Tia Alert, MD;  Location: [facility] NEURO ORS;  Service: Neurosurgery;  Laterality: N/A;   REVERSE SHOULDER ARTHROPLASTY Left 03/01/2020   Procedure: REVERSE SHOULDER ARTHROPLASTY;  Surgeon: Christena Flake, MD;  Location: [facility] ORS;  Service: Orthopedics;  Laterality: Left;   SHOULDER SURGERY     RIGHT    TONSILLECTOMY      Prior to Admission medications   Medication Sig Start Date End Date Taking? Authorizing Provider  cyclobenzaprine (FLEXERIL) 5 MG tablet Take 1 tablet (5 mg total) by mouth 3 (three) times daily as needed for muscle spasms. 11/04/20  Yes Lorre Munroe, NP  HYDROcodone-acetaminophen (NORCO/VICODIN) 5-325 MG tablet Take 1 tablet by mouth every 8 (eight) hours as needed for moderate pain. 11/04/20 11/04/21 Yes Brita Jurgensen, Salvadore Oxford, NP  albuterol (PROVENTIL HFA;VENTOLIN HFA) 108 (90 Base) MCG/ACT inhaler Inhale into the lungs.     [provider]  ASPIRIN 81 PO Take by mouth in the morning, at noon, and at bedtime.     [provider]  azelastine (ASTELIN) 0.1 % nasal spray Place 1 spray into both nostrils at bedtime as needed.  11/23/15   [provider]  BREO ELLIPTA 200-25 MCG/INH AEPB Take 1 puff by mouth daily.  03/05/17   [provider]  DULoxetine (CYMBALTA) 60 MG capsule Take 30 mg by mouth daily.    [provider]  estradiol (ESTRACE) 0.1 MG/GM vaginal cream Place vaginally. 05/29/20  [provider]  fluticasone (FLONASE) 50 MCG/ACT nasal spray Place 2 sprays into both nostrils 2 (two) times daily.  04/12/14   [provider]  gabapentin (NEURONTIN) 800 MG tablet TAKE 1 TABLET BY MOUTH TWICE A DAY 09/28/19   Glean Salvo, NP  latanoprost (XALATAN) 0.005 % ophthalmic solution Place 1 drop into both eyes at bedtime.     [provider]  levothyroxine (SYNTHROID, LEVOTHROID) 112 MCG tablet Take 112 mcg by mouth daily before breakfast.  11/07/15   [provider]  metFORMIN (GLUCOPHAGE) 500 MG tablet Take 500 mg by mouth 2 (two) times daily with a meal.     [provider]  methylPREDNISolone (MEDROL DOSEPAK) 4 MG TBPK tablet Take by mouth.    [provider]  metoprolol succinate (TOPROL-XL) 50 MG 24 hr tablet TAKE 1 TABLET BY MOUTH DAILY. TAKE WITH OR IMMEDIATELY FOLLOWING A MEAL. 08/08/20   Glean Salvo, NP  montelukast (SINGULAIR) 10 MG tablet  11/24/18   [provider]  ONE TOUCH ULTRA TEST test strip USE 2 (TWO) TIMES DAILY USE AS INSTRUCTED. 11/13/17   [provider]  oxybutynin (DITROPAN) 5 MG tablet Take 5 mg by mouth 2 (two)  times daily.    [provider]  pravastatin (PRAVACHOL) 10 MG tablet Take 10 mg by mouth daily.    [provider]  rOPINIRole (REQUIP) 4 MG tablet Take 1 tablet (4 mg total) by mouth 2 (two) times daily. 05/09/19   Glean Salvo, NP  solifenacin (VESICARE) 5 MG tablet Take 5 mg by mouth daily. 05/29/20   [provider]  tamsulosin (FLOMAX) 0.4 MG CAPS capsule TAKE 1 CAPSULE BY MOUTH EVERY DAY 07/24/20   Sondra Come, MD  telmisartan (MICARDIS) 40 MG tablet Take 40 mg by mouth daily. 01/02/19   [provider]  traMADol (ULTRAM) 50 MG tablet TAKE 1/2-1 TABLET BY MOUTH 2 TIMES A DAY AS NEEDED 10/08/20   York Spaniel, MD  UNABLE TO FIND 3 tablets. Med Name: [place]. Takes 2 in the morning and 1 at night    [provider]    Allergies Diazepam and Dexamethasone  Family History  Problem Relation Age of Onset   Lung cancer Mother    Congestive Heart Failure Father    Depression Sister    Rheum arthritis Sister    Headache Maternal Grandfather    Migraines Maternal Grandfather    Headache Maternal Uncle    Diabetes Other    Heart disease Other    Hypertension Other    Breast cancer Neg Hx    Bladder Cancer Neg Hx    Kidney cancer Neg Hx     Social History Social History   Tobacco Use   Smoking status: Never   Smokeless tobacco: Never  Vaping Use   Vaping Use: Never used  Substance Use Topics   Alcohol use: No   Drug use: No    Review of Systems  Constitutional: Negative for fever, chills or body aches. Cardiovascular: Negative for chest pain or chest tightness. Respiratory: Negative for cough or shortness of breath. Gastrointestinal: Negative for loss of bowel control. Genitourinary: Negative for urgency, frequency, dysuria, blood in urine or loss of bladder control. Musculoskeletal: Positive for right sided back pain, right leg weakness.  Negative for decrease in range of motion, joint pain or swelling. Skin:  Negative for rash. Neurological: Negative for tingling or numbness. ____________________________________________  PHYSICAL EXAM:  VITAL SIGNS:  ED Triage Vitals  Enc Vitals Group     BP 11/04/20 0925 (!) 156/79     Pulse Rate 11/04/20 0925 70     Resp 11/04/20 0925 19     Temp 11/04/20 0925 98.1 F (36.7 C)     Temp Source 11/04/20 0925 Oral     SpO2 11/04/20 0925 96 %     Weight 11/04/20 0920 195 lb 15.8 oz (88.9 kg)     Height 11/04/20 0920 4\' 11"  (1.499 m)     Head Circumference --      Peak Flow --      Pain Score 11/04/20 0920 10     Pain Loc --      Pain Edu? --      Excl. in GC? --     Constitutional: Alert and oriented.  Obese, appears in pain but in no distress. Head: Normocephalic. Eyes: Normal extraocular movements Cardiovascular: Normal rate, regular rhythm.  Pedal pulses 2+ bilaterally. Respiratory: Normal respiratory effort. No wheezes/rales/rhonchi. Gastrointestinal: Soft and nontender. No CVA tenderness noted. Musculoskeletal: Normal flexion, lateral bending and rotation to the right.  Pain with extension, lateral bending and rotation to the left.  No bony tenderness noted over the lumbar spine.  Strength 5/5 BLE.  Limping gait. Neurologic:  Normal speech and language.  + SLR on the right. Skin:  Skin is warm, dry and intact. No rash noted.  ______________________________________________   RADIOLOGY Imaging Orders  DG Lumbar Spine 2-3 Views   IMPRESSION:  Posterior fusion of the lumbar spine.     Grade 1 retrolisthesis of L1 on L2.   ____________________________________________    INITIAL IMPRESSION / ASSESSMENT AND PLAN / ED COURSE  Acute on Chronic Right Sided Low Back Pain with Right Sided Sciatica:  Xray lumbar spine shows not acute findings Decadron 10 mg IM RX for Hydrocodone- Acetaminophen 5-325 mg PO Q8H prn- sedation caution given RX  for Flexeril 5 mg Q8H prn- sedation caution given Encouraged ice and stretching Will have her follow  up with Dr. 01/05/21 as an outpatient   I reviewed the patient's prescription history over the last 12 months in the multi-state controlled substances database(s) that includes [place], [place], [place], [place], [place], [place], [place], [place], [place], [place], [place], [place], [place], and [place].  Results were notable for 10/08/20 Tramadol 50 #14, 06/05/20 Percocet 5-325 #10. ____________________________________________  FINAL CLINICAL IMPRESSION(S) / ED DIAGNOSES  Final diagnoses:  Chronic right-sided low back pain with right-sided sciatica      08/03/20, NP 11/04/20 1207    1208, NP 11/04/20 1332    01/05/21, MD 11/05/20 1906

## 2020-11-04 NOTE — Discharge Instructions (Addendum)
You were seen today for right-sided low back pain with right-sided sciatica.  Your x-ray does not show any acute findings.  You were treated with a steroid in the emergency department.  I am giving a prescription for muscle relaxers and pain medications.  Please be aware that these will cause sedation.  Please do not take hydrocodone along with tramadol.  I encouraged ice and stretching.  Please follow-up with Dr. Yves Dill for further evaluation.

## 2020-11-04 NOTE — ED Notes (Signed)
Pt given diet soda and crackers, pt tolerating well.

## 2020-11-04 NOTE — ED Triage Notes (Signed)
Pt comes into the ED via POV c/o right leg pain.  Pt has known sciatica and she states it has gotten too bad for her to handle at home and her leg has gone out on her and she has almost fallen from it.  Pt states the pain has been ongoing x 3 days.  Pt ambulatory to triage at this time.  Pt states the sharp pain radiates from hip down to her foot.

## 2020-11-04 NOTE — ED Notes (Addendum)
Pt with c/o right leg nerve pain. No swelling noted, skin intact. Pt ambulated to bathroom with stand by assist, pt limping but gait steady.

## 2020-11-07 ENCOUNTER — Ambulatory Visit (HOSPITAL_COMMUNITY)
Admission: RE | Admit: 2020-11-07 | Discharge: 2020-11-07 | Disposition: A | Discharge status: Home / Self Care | Payer: Medicare HMO | Source: Ambulatory Visit | Attending: Neurology | Admitting: Neurology

## 2020-11-07 ENCOUNTER — Other Ambulatory Visit: Payer: Self-pay

## 2020-11-07 DIAGNOSIS — G3184 Mild cognitive impairment, so stated: Secondary | ICD-10-CM | POA: Diagnosis present

## 2020-11-07 MED ORDER — GADOBUTROL 1 MMOL/ML IV SOLN
8.5000 mL | Freq: Once | INTRAVENOUS | Status: AC | PRN
  Administered 2020-11-07: 8.5 mL via INTRAVENOUS

## 2020-11-26 ENCOUNTER — Ambulatory Visit: Payer: Medicare HMO | Attending: Family Medicine

## 2020-11-26 DIAGNOSIS — G8929 Other chronic pain: Secondary | ICD-10-CM | POA: Diagnosis present

## 2020-11-26 DIAGNOSIS — M545 Low back pain, unspecified: Secondary | ICD-10-CM | POA: Diagnosis present

## 2020-11-26 DIAGNOSIS — M5417 Radiculopathy, lumbosacral region: Secondary | ICD-10-CM | POA: Diagnosis present

## 2020-11-26 NOTE — Therapy (Signed)
[facility] [address] [place], [place], [address] Phone: [phone]   Fax:  [phone]  Physical Therapy Evaluation  Patient Details  Name: Jessica Cole MRN: [id number] Date of Birth: [birth year] Referring Provider (PT): Burman Freestone, NP   Encounter Date: 11/26/2020   PT End of Session - 11/26/20 1147     Visit Number 1    Number of Visits 17    Date for PT Re-Evaluation 01/24/21    Authorization Type 1    Authorization Time Period 10    PT Start Time 1147    PT Stop Time 1232    PT Time Calculation (min) 45 min    Activity Tolerance Patient tolerated treatment well    Behavior During Therapy [facility] for tasks assessed/performed             Past Medical History:  Diagnosis Date   Anxiety    Asthma    seasonal with allergies   Colon polyps    Degenerative arthritis    Depression    Diabetes mellitus without complication (HCC)    Type II   Environmental allergies    Family history of adverse reaction to anesthesia    sister- PONV   Fatty liver    Glaucoma    Headache(784.0)    HX  MIGRAINES   History of bronchitis    History of kidney stones    History of pneumonia    Hypertension    Hypothyroidism    Obesity    OSA on CPAP    Plantar fasciitis    right   PONV (postoperative nausea and vomiting)    Renal calculi    Renal calculi    Rheumatoid arthritis (HCC)    RLS (restless legs syndrome) 08/23/2014    Past Surgical History:  Procedure Laterality Date   ABDOMINAL HYSTERECTOMY     partial   APPENDECTOMY     Arthroscopic surgery, knee Left    BREAST BIOPSY Right    several   BREAST BIOPSY Right 03/11/2017   u/s bx neg   BUNIONECTOMY     LEFT    COLONOSCOPY W/ POLYPECTOMY     COLONOSCOPY WITH PROPOFOL N/A 08/14/2017   Procedure: COLONOSCOPY WITH PROPOFOL;  Surgeon: Christena Deem, MD;  Location: [facility] ENDOSCOPY;  Service: Endoscopy;  Laterality: N/A;   EXTRACORPOREAL  SHOCK WAVE LITHOTRIPSY Left 05/14/2017   Procedure: EXTRACORPOREAL SHOCK WAVE LITHOTRIPSY (ESWL);  Surgeon: Riki Altes, MD;  Location: [facility] ORS;  Service: Urology;  Laterality: Left;   EXTRACORPOREAL SHOCK WAVE LITHOTRIPSY Left 06/14/2020   Procedure: EXTRACORPOREAL SHOCK WAVE LITHOTRIPSY (ESWL);  Surgeon: Sondra Come, MD;  Location: [facility] ORS;  Service: Urology;  Laterality: Left;   FOOT SURGERY     RIGHT     KNEE ARTHROSCOPY W/ MENISCAL REPAIR Left    LASIK     MAXIMUM ACCESS (MAS)POSTERIOR LUMBAR INTERBODY FUSION (PLIF) 1 LEVEL N/A 04/25/2016   Procedure: LUMBAR FOUR-FIVE  MAXIMUM ACCESS (MAS) POSTERIOR LUMBAR INTERBODY FUSION (PLIF) with extension of instrumentation LUMBAR TWO-FIVE;  Surgeon: Tia Alert, MD;  Location: [facility] OR;  Service: Neurosurgery;  Laterality: N/A;   MAXIMUM ACCESS (MAS)POSTERIOR LUMBAR INTERBODY FUSION (PLIF) 2 LEVEL N/A 12/13/2015   Procedure: Lumbar two-three - Lumbar three-four MAXIMUM ACCESS (MAS) POSTERIOR LUMBAR INTERBODY FUSION (PLIF)  ;  Surgeon: Tia Alert, MD;  Location: [facility] NEURO ORS;  Service: Neurosurgery;  Laterality: N/A;   REVERSE SHOULDER ARTHROPLASTY Left 03/01/2020  Procedure: REVERSE SHOULDER ARTHROPLASTY;  Surgeon: Christena Flake, MD;  Location: [facility] ORS;  Service: Orthopedics;  Laterality: Left;   SHOULDER SURGERY     RIGHT    TONSILLECTOMY      There were no vitals filed for this visit.    Subjective Assessment - 11/26/20 1149     Subjective Low back: 4/10 currently, 20/10 at worst for the past 3 months. R LE (L5 dermatome) 12/10 currently (pt sitting), 20/10 at worst for the past 3 months. No L LE symptoms.    Pertinent History Low back pain with R LE radiating symptoms. Pain in R posterior hip with radiating down along R L5 dermatome to ankle. Pain appeared since after [place] 2021. Got a steroid injection 08/2020 which did not help. Sep 10, 2020, pt had 2 more steroid injections for her back which helped but pain returned July  2022. Went to the ER July 3 due to back pain. Pt was at the beach and fell while in waist deep water.  The day before, pt fell backwards and her back hit the sand when pt stepped onto a dip in the sand.  Stepped on her R shoe lace the night before leaving the beach and fell to her L side onto the floor on November 15, 2020 evening while packing up. Pain has worsend since her beach trip. Denies loss of bowel control, sees a doctor for her bladder. Denies saddle anesthesia.    Patient Stated Goals Decrease pain.    Currently in Pain? Yes    Pain Score 4     Pain Location Back    Pain Orientation Right;Lower    Pain Type Chronic pain;Acute pain    Pain Onset More than a month ago    Pain Frequency Constant    Aggravating Factors  Getting up from the chair, standing about 10 min, but back bothers her immediatedly when standing up; turning in bed, walking    Pain Relieving Factors Sitting, pain medication. Ice                [facility] PT Assessment - 11/26/20 1148       Assessment   Medical Diagnosis Lumbar DDD, radiculitis, stenosis with neurogenic claudication    Referring Provider (PT) Burman Freestone, NP    Onset Date/Surgical Date 11/20/20    Prior Therapy Yes      Precautions   Precaution Comments Fall risk      Restrictions   Other Position/Activity Restrictions No known restrictions      Balance Screen   Has the patient fallen in the past 6 months Yes      Prior Function   Vocation Retired      Observation/Other Assessments   Observations Supine position: R LE longer. Long sit position: equal LE suggesting possible anterior nutaiton R innominate. Mechanical table assist.    Focus on Therapeutic Outcomes (FOTO)  Pt did not finish FOTO survey      Posture/Postural Control   Posture Comments forward neck, B protracted shoulders, thoracic kyphosis, R lumbar side bend, R lumbar rotation, R greater trochanter slightly higher, R hip in slight ER.      AROM   Lumbar Flexion WFL,  with low back pain and symptoms along the R L5 and S/1 dermatomes   Aberrant movement on return motion with pt walking up with hands.   Lumbar Extension WFL, slight low back pain. R lumbar rotation. B hip flexor stretch felt.    Lumbar - Right  [facility] with posterior hip and R lateral leg (L5) pain.    Lumbar - Left [facility] with R low back pain    Lumbar - Right Rotation WFL    Lumbar - Left Rotation WFL with slgith R hip catch on the return motion      PROM   Overall PROM Comments Hip IR sitting: WFL bilaterally      Strength   Right Hip Flexion 4-/5    Right Hip Extension 4-/5   seated manually resited   Right Hip ABduction 4/5   seated manually resited clamshell   Left Hip Flexion 4-/5   with R posterior thigh pain; Pt demonstrates R side bend   Left Hip Extension 4-/5   seated manually resited   Left Hip ABduction 4/5   seated manually resited clamshell   Right Knee Flexion 5/5    Right Knee Extension 5/5    Left Knee Flexion 5/5    Left Knee Extension 5/5      Palpation   Palpation comment TTP R greater trochanter.      Ambulation/Gait   Gait Comments R lateral lean during R stance phase, decreased stance L LE                        Objective measurements completed on examination: See above findings.       Ther-ex  Seated hip extension isometrics   L 10x5 seconds for 3 sets   Decreased R low back extension pressure observed.   Decreased R lateral leg symptoms. L knee joint pain from ground pressure.     Improved exercise technique, movement at target joints, use of target muscles after mod verbal, visual, tactile cues.   Response to treatment Low back fatigue afterwards felt. Decreased R lateral leg pain. Pt tolerated session well without aggravation of symptoms.    Clincial presentation Pt is a 70 year old female who came to physical therapy secondary to low back pain with R LE radiating symptoms along the L5/S1 dermatomes. She also  presents with altered gait pattern and posture. TTP, B hip weakness, decreased core strength, and difficulty performing tasks which involve standing as well as difficulty with bed mobility and transfers due to back and R LE pain. Pt will benefit from skilled physical therapy services to address the aforementioned deficits.                  PT Education - 11/26/20 1427     Education Details ther-ex, plan of care    Person(s) Educated Patient    Methods Explanation;Demonstration;Tactile cues;Verbal cues    Comprehension Returned demonstration;Verbalized understanding              PT Short Term Goals - 11/26/20 1316       PT SHORT TERM GOAL #1   Title Pt will be independent with her initial HEP to decrease pain, improve strength, function, and ability to perform transfers more comfortably and with less difficulty.    Time 3    Period Weeks    Status New    Target Date 12/20/20               PT Long Term Goals - 11/26/20 1317       PT LONG TERM GOAL #1   Title Pt will have a decrease in low back pain to 6/10 or less at worst to promote ability to perform transfers, bed mobility as well  as perform standing tasks more comfortably.    Baseline 20/10 low back pain at worst for the past 3 months (11/26/2020)    Time 8    Period Weeks    Status New    Target Date 01/24/21      PT LONG TERM GOAL #2   Title Patient will have a decrease in R LE pain to 6/10 or less at worst to promote ability to perform transfers, bed mobility as well as perform standing tasks more comfortably.    Baseline 20/10 R LE pain at worst to R foot for the past 3 months (11/26/2020)    Time 8    Period Weeks    Status New    Target Date 01/24/21      PT LONG TERM GOAL #3   Title Patient will improve her lumbar FOTO score by at least 10 points as a demonstration of improved function.    Baseline Lumbar FOTO: pt unable to complete (11/26/2020)    Time 8    Period Weeks    Status New     Target Date 01/24/21      PT LONG TERM GOAL #4   Title Pt will improve bilateral hip extension and abduction strength by at least 1/2 MMT grade to promote ability to stand, ambulate and perform transfers more comfortably.    Baseline Hip extension 4-/5 R and L, hip abduction 4/5 R and L (11/26/2020)    Time 8    Period Weeks    Status New    Target Date 01/24/21                    Plan - 11/26/20 1312     Clinical Impression Statement Pt is a 70 year old female who came to physical therapy secondary to low back pain with R LE radiating symptoms along the L5/S1 dermatomes. She also presents with altered gait pattern and posture. TTP, B hip weakness, decreased core strength, and difficulty performing tasks which involve standing as well as difficulty with bed mobility and transfers due to back and R LE pain. Pt will benefit from skilled physical therapy services to address the aforementioned deficits.    Personal Factors and Comorbidities Comorbidity 3+;Age;Fitness;Past/Current Experience;Time since onset of injury/illness/exacerbation    Comorbidities Anxiety, asthma, depression, DM, hx of kidney stones, obesity, RA    Examination-Activity Limitations Bed Mobility;Stand;Lift;Locomotion Level;Bend;Transfers;Carry    Stability/Clinical Decision Making Evolving/Moderate complexity    Clinical Decision Making Moderate    Rehab Potential Fair    PT Frequency 2x / week    PT Duration 8 weeks    PT Treatment/Interventions Therapeutic exercise;Balance training;Neuromuscular re-education;Therapeutic activities;Patient/family education;Manual techniques;Dry needling;Aquatic Therapy;Electrical Stimulation;Iontophoresis 4mg /ml Dexamethasone    PT Next Visit Plan Posture, trunk,m hip strengthening, manual techniques, modalities PRN    Consulted and Agree with Plan of Care Patient             Patient will benefit from skilled therapeutic intervention in order to improve the following  deficits and impairments:  Pain, Improper body mechanics, Postural dysfunction, Decreased strength, Decreased balance  Visit Diagnosis: Chronic right-sided low back pain, unspecified whether sciatica present - Plan: PT plan of care cert/re-cert  Radiculopathy, lumbosacral region - Plan: PT plan of care cert/re-cert     Problem List Patient Active Problem List   Diagnosis Date Noted   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) 01/03/2019   Chronic cough  11/19/2016   Morbid obesity (HCC) 09/26/2016   Left ovarian cyst 05/30/2016   S/P lumbar spinal fusion 12/13/2015   Aortic calcification (HCC) 07/18/2015   Controlled type 2 diabetes mellitus without complication (HCC) 05/16/2015   Thrombocytopenia (HCC) 05/16/2015   Recurrent major depressive disorder, in full remission (HCC) 05/16/2015   Essential (primary) hypertension 05/16/2015   Fatty infiltration of liver 03/15/2015   Aortic valve stenosis, nonrheumatic 01/18/2015   Sciatica of right side 01/09/2015   Type 2 diabetes mellitus (HCC) 11/10/2014   RLS (restless legs syndrome) 08/23/2014   Neuritis or radiculitis due to rupture of lumbar intervertebral disc 06/13/2014   Degeneration of intervertebral disc of lumbar region 06/13/2014   Lumbar radiculitis 06/13/2014   Arthritis 01/23/2014   Arthritis of knee, degenerative 11/15/2013   Depression 09/29/2013   Insomnia 09/29/2013   Apnea, sleep 08/29/2013   History of nephrolithiasis 08/29/2013   Abnormal presence of protein in urine 08/29/2013   Headache, migraine 08/29/2013   BP (high blood pressure) 08/29/2013   HLD (hyperlipidemia) 08/29/2013   Allergic rhinitis 08/29/2013   Intractable migraine without aura 02/18/2013    Loralyn Freshwater PT, DPT   11/26/2020, 6:12 PM  [facility] [address] [place],  [address] Phone: [phone]   Fax:  [phone]  Name: JACQUELINA HEWINS MRN: [id number] Date of Birth: 02-21-1951

## 2020-11-28 ENCOUNTER — Ambulatory Visit: Payer: Medicare HMO

## 2020-11-28 DIAGNOSIS — M5417 Radiculopathy, lumbosacral region: Secondary | ICD-10-CM

## 2020-11-28 DIAGNOSIS — M545 Low back pain, unspecified: Secondary | ICD-10-CM | POA: Diagnosis not present

## 2020-11-28 DIAGNOSIS — G8929 Other chronic pain: Secondary | ICD-10-CM

## 2020-11-28 NOTE — Therapy (Signed)
[facility] [address] [place], [place], [address] Phone: [phone]   Fax:  [phone]  Physical Therapy Treatment  Patient Details  Name: Jessica Cole MRN: [id number] Date of Birth: 12-16-1950 Referring Provider (PT): Burman Freestone, NP   Encounter Date: 11/28/2020   PT End of Session - 11/28/20 1102     Visit Number 2    Number of Visits 17    Date for PT Re-Evaluation 01/24/21    Authorization Type 2    Authorization Time Period 10    PT Start Time 1104    PT Stop Time 1143    PT Time Calculation (min) 39 min    Activity Tolerance Patient tolerated treatment well    Behavior During Therapy WFL for tasks assessed/performed             Past Medical History:  Diagnosis Date   Anxiety    Asthma    seasonal with allergies   Colon polyps    Degenerative arthritis    Depression    Diabetes mellitus without complication (HCC)    Type II   Environmental allergies    Family history of adverse reaction to anesthesia    sister- PONV   Fatty liver    Glaucoma    Headache(784.0)    HX  MIGRAINES   History of bronchitis    History of kidney stones    History of pneumonia    Hypertension    Hypothyroidism    Obesity    OSA on CPAP    Plantar fasciitis    right   PONV (postoperative nausea and vomiting)    Renal calculi    Renal calculi    Rheumatoid arthritis (HCC)    RLS (restless legs syndrome) 08/23/2014    Past Surgical History:  Procedure Laterality Date   ABDOMINAL HYSTERECTOMY     partial   APPENDECTOMY     Arthroscopic surgery, knee Left    BREAST BIOPSY Right    several   BREAST BIOPSY Right 03/11/2017   u/s bx neg   BUNIONECTOMY     LEFT    COLONOSCOPY W/ POLYPECTOMY     COLONOSCOPY WITH PROPOFOL N/A 08/14/2017   Procedure: COLONOSCOPY WITH PROPOFOL;  Surgeon: Christena Deem, MD;  Location: [facility] ENDOSCOPY;  Service: Endoscopy;  Laterality: N/A;   EXTRACORPOREAL SHOCK  WAVE LITHOTRIPSY Left 05/14/2017   Procedure: EXTRACORPOREAL SHOCK WAVE LITHOTRIPSY (ESWL);  Surgeon: Riki Altes, MD;  Location: [facility] ORS;  Service: Urology;  Laterality: Left;   EXTRACORPOREAL SHOCK WAVE LITHOTRIPSY Left 06/14/2020   Procedure: EXTRACORPOREAL SHOCK WAVE LITHOTRIPSY (ESWL);  Surgeon: Sondra Come, MD;  Location: [facility] ORS;  Service: Urology;  Laterality: Left;   FOOT SURGERY     RIGHT     KNEE ARTHROSCOPY W/ MENISCAL REPAIR Left    LASIK     MAXIMUM ACCESS (MAS)POSTERIOR LUMBAR INTERBODY FUSION (PLIF) 1 LEVEL N/A 04/25/2016   Procedure: LUMBAR FOUR-FIVE  MAXIMUM ACCESS (MAS) POSTERIOR LUMBAR INTERBODY FUSION (PLIF) with extension of instrumentation LUMBAR TWO-FIVE;  Surgeon: Tia Alert, MD;  Location: [facility] OR;  Service: Neurosurgery;  Laterality: N/A;   MAXIMUM ACCESS (MAS)POSTERIOR LUMBAR INTERBODY FUSION (PLIF) 2 LEVEL N/A 12/13/2015   Procedure: Lumbar two-three - Lumbar three-four MAXIMUM ACCESS (MAS) POSTERIOR LUMBAR INTERBODY FUSION (PLIF)  ;  Surgeon: Tia Alert, MD;  Location: [facility] NEURO ORS;  Service: Neurosurgery;  Laterality: N/A;   REVERSE SHOULDER ARTHROPLASTY Left 03/01/2020  Procedure: REVERSE SHOULDER ARTHROPLASTY;  Surgeon: Christena Flake, MD;  Location: [facility] ORS;  Service: Orthopedics;  Laterality: Left;   SHOULDER SURGERY     RIGHT    TONSILLECTOMY      There were no vitals filed for this visit.   Subjective Assessment - 11/28/20 1105     Subjective Low back is about a 5/10 currently. 8/10 R LE symptoms currently    Pertinent History Low back pain with R LE radiating symptoms. Pain in R posterior hip with radiating down along R L5 dermatome to ankle. Pain appeared since after [place] 2021. Got a steroid injection 08/2020 which did not help. Sep 10, 2020, pt had 2 more steroid injections for her back which helped but pain returned July 2022. Went to the ER July 3 due to back pain. Pt was at the beach and fell while in waist deep water.  The day  before, pt fell backwards and her back hit the sand when pt stepped onto a dip in the sand.  Stepped on her R shoe lace the night before leaving the beach and fell to her L side onto the floor on November 15, 2020 evening while packing up. Pain has worsend since her beach trip. Denies loss of bowel control, sees a doctor for her bladder. Denies saddle anesthesia.    Patient Stated Goals Decrease pain.    Currently in Pain? Yes    Pain Score 5     Pain Location Back    Pain Onset More than a month ago                                       PT Education - 11/28/20 1129     Education Details ther-ex, HEP    Person(s) Educated Patient    Methods Explanation;Demonstration;Tactile cues;Verbal cues;Handout    Comprehension Returned demonstration;Verbalized understanding             Objective      Posture: R lumbar side bend    Medbridge Access Code [email or web address]   Manual therapy Seated STM lumbar paraspinals muscle R > L and R quadratus lumborum. Increased symptoms with pressure to R quadratus lumborum.   Decreased R LE pain to 4/10 afterwards in sitting    Ther-ex   Seated hip extension isometrics             L 10x5 seconds for 3 sets                         Seated physioball roll to the L 5x3 with 5 second holds   Sitting with upright posture  B shoulder extension isometrics, hands on knees 10x5 seconds for 3 sets  Seated glute max set 10x5 seconds for 3 sets  Sitting in proper posture  Manually resisted trunk flexion isometrics in neutral 10x3 with 5 second holds    Improved exercise technique, movement at target joints, use of target muscles after mod verbal, visual, tactile cues.     Response to treatment Decreased R LE symptoms after treatment in sitting. Decreased low back pain to 4/10 in sitting. Symptoms however returned when standing and walking     Clincial presentation Demonstrates TTP and tightness B lumbar paraspinal muscles and R  quadratus lumborum muscle. Decreased symptoms with treatment to decrease muscle tension there to decrease stress to R low back. Symptoms however returned  when standing and walking. Pt will benefit from continued skilled physical therapy services to decrease pain, improve strength, and function.       PT Short Term Goals - 11/26/20 1316       PT SHORT TERM GOAL #1   Title Pt will be independent with her initial HEP to decrease pain, improve strength, function, and ability to perform transfers more comfortably and with less difficulty.    Time 3    Period Weeks    Status New    Target Date 12/20/20               PT Long Term Goals - 11/26/20 1317       PT LONG TERM GOAL #1   Title Pt will have a decrease in low back pain to 6/10 or less at worst to promote ability to perform transfers, bed mobility as well as perform standing tasks more comfortably.    Baseline 20/10 low back pain at worst for the past 3 months (11/26/2020)    Time 8    Period Weeks    Status New    Target Date 01/24/21      PT LONG TERM GOAL #2   Title Patient will have a decrease in R LE pain to 6/10 or less at worst to promote ability to perform transfers, bed mobility as well as perform standing tasks more comfortably.    Baseline 20/10 R LE pain at worst to R foot for the past 3 months (11/26/2020)    Time 8    Period Weeks    Status New    Target Date 01/24/21      PT LONG TERM GOAL #3   Title Patient will improve her lumbar FOTO score by at least 10 points as a demonstration of improved function.    Baseline Lumbar FOTO: pt unable to complete (11/26/2020)    Time 8    Period Weeks    Status New    Target Date 01/24/21      PT LONG TERM GOAL #4   Title Pt will improve bilateral hip extension and abduction strength by at least 1/2 MMT grade to promote ability to stand, ambulate and perform transfers more comfortably.    Baseline Hip extension 4-/5 R and L, hip abduction 4/5 R and L (11/26/2020)     Time 8    Period Weeks    Status New    Target Date 01/24/21                   Plan - 11/28/20 1129     Clinical Impression Statement Demonstrates TTP and tightness B lumbar paraspinal muscles and R quadratus lumborum muscle. Decreased symptoms with treatment to decrease muscle tension there to decrease stress to R low back. Symptoms however returned when standing and walking. Pt will benefit from continued skilled physical therapy services to decrease pain, improve strength, and function.    Personal Factors and Comorbidities Comorbidity 3+;Age;Fitness;Past/Current Experience;Time since onset of injury/illness/exacerbation    Comorbidities Anxiety, asthma, depression, DM, hx of kidney stones, obesity, RA    Examination-Activity Limitations Bed Mobility;Stand;Lift;Locomotion Level;Bend;Transfers;Carry    Stability/Clinical Decision Making Evolving/Moderate complexity    Rehab Potential Fair    PT Frequency 2x / week    PT Duration 8 weeks    PT Treatment/Interventions Therapeutic exercise;Balance training;Neuromuscular re-education;Therapeutic activities;Patient/family education;Manual techniques;Dry needling;Aquatic Therapy;Electrical Stimulation;Iontophoresis 4mg /ml Dexamethasone    PT Next Visit Plan Posture, trunk,m hip strengthening, manual techniques, modalities PRN  Consulted and Agree with Plan of Care Patient             Patient will benefit from skilled therapeutic intervention in order to improve the following deficits and impairments:  Pain, Improper body mechanics, Postural dysfunction, Decreased strength, Decreased balance  Visit Diagnosis: Chronic right-sided low back pain, unspecified whether sciatica present  Radiculopathy, lumbosacral region     Problem List Patient Active Problem List   Diagnosis Date Noted   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) 01/03/2019    Chronic cough 11/19/2016   Morbid obesity (HCC) 09/26/2016   Left ovarian cyst 05/30/2016   S/P lumbar spinal fusion 12/13/2015   Aortic calcification (HCC) 07/18/2015   Controlled type 2 diabetes mellitus without complication (HCC) 05/16/2015   Thrombocytopenia (HCC) 05/16/2015   Recurrent major depressive disorder, in full remission (HCC) 05/16/2015   Essential (primary) hypertension 05/16/2015   Fatty infiltration of liver 03/15/2015   Aortic valve stenosis, nonrheumatic 01/18/2015   Sciatica of right side 01/09/2015   Type 2 diabetes mellitus (HCC) 11/10/2014   RLS (restless legs syndrome) 08/23/2014   Neuritis or radiculitis due to rupture of lumbar intervertebral disc 06/13/2014   Degeneration of intervertebral disc of lumbar region 06/13/2014   Lumbar radiculitis 06/13/2014   Arthritis 01/23/2014   Arthritis of knee, degenerative 11/15/2013   Depression 09/29/2013   Insomnia 09/29/2013   Apnea, sleep 08/29/2013   History of nephrolithiasis 08/29/2013   Abnormal presence of protein in urine 08/29/2013   Headache, migraine 08/29/2013   BP (high blood pressure) 08/29/2013   HLD (hyperlipidemia) 08/29/2013   Allergic rhinitis 08/29/2013   Intractable migraine without aura 02/18/2013    Loralyn FreshwaterMiguel Chelly Dombeck PT, DPT  11/28/2020, 1:37 PM  [facility] [address] [place], [place], [address] Phone: [phone]   Fax:  [phone]  Name: Jessica Cole MRN: [id number] Date of Birth: 24-May-1950

## 2020-11-28 NOTE — Patient Instructions (Signed)
  Sitting on a chair    Press your hands on your thighs to feel your abdominal muscles contract.   Hold for 5 seconds comfortably.   Repeat 10 times.   Perform 3 sets daily.   

## 2020-12-03 ENCOUNTER — Ambulatory Visit: Payer: Medicare HMO | Attending: Family Medicine

## 2020-12-03 DIAGNOSIS — M5417 Radiculopathy, lumbosacral region: Secondary | ICD-10-CM | POA: Diagnosis present

## 2020-12-03 DIAGNOSIS — M545 Low back pain, unspecified: Secondary | ICD-10-CM | POA: Insufficient documentation

## 2020-12-03 DIAGNOSIS — M6281 Muscle weakness (generalized): Secondary | ICD-10-CM | POA: Insufficient documentation

## 2020-12-03 DIAGNOSIS — G8929 Other chronic pain: Secondary | ICD-10-CM | POA: Insufficient documentation

## 2020-12-03 NOTE — Therapy (Signed)
[place] [facility] [address] [place], [place], [address] Phone: [phone]   Fax:  [phone]  Physical Therapy Treatment  Patient Details  Name: Jessica Cole MRN: [id number] Date of Birth: 02/09/1951 Referring Provider (PT): Burman Freestone, NP   Encounter Date: 12/03/2020   PT End of Session - 12/03/20 1726     Visit Number 3    Number of Visits 17    Date for PT Re-Evaluation 01/24/21    Authorization Type 3    Authorization Time Period 10    PT Start Time 1726    PT Stop Time 1810    PT Time Calculation (min) 44 min    Activity Tolerance Patient tolerated treatment well    Behavior During Therapy WFL for tasks assessed/performed             Past Medical History:  Diagnosis Date   Anxiety    Asthma    seasonal with allergies   Colon polyps    Degenerative arthritis    Depression    Diabetes mellitus without complication (HCC)    Type II   Environmental allergies    Family history of adverse reaction to anesthesia    sister- PONV   Fatty liver    Glaucoma    Headache(784.0)    HX  MIGRAINES   History of bronchitis    History of kidney stones    History of pneumonia    Hypertension    Hypothyroidism    Obesity    OSA on CPAP    Plantar fasciitis    right   PONV (postoperative nausea and vomiting)    Renal calculi    Renal calculi    Rheumatoid arthritis (HCC)    RLS (restless legs syndrome) 08/23/2014    Past Surgical History:  Procedure Laterality Date   ABDOMINAL HYSTERECTOMY     partial   APPENDECTOMY     Arthroscopic surgery, knee Left    BREAST BIOPSY Right    several   BREAST BIOPSY Right 03/11/2017   u/s bx neg   BUNIONECTOMY     LEFT    COLONOSCOPY W/ POLYPECTOMY     COLONOSCOPY WITH PROPOFOL N/A 08/14/2017   Procedure: COLONOSCOPY WITH PROPOFOL;  Surgeon: Christena Deem, MD;  Location: [facility] ENDOSCOPY;  Service: Endoscopy;  Laterality: N/A;   EXTRACORPOREAL SHOCK  WAVE LITHOTRIPSY Left 05/14/2017   Procedure: EXTRACORPOREAL SHOCK WAVE LITHOTRIPSY (ESWL);  Surgeon: Riki Altes, MD;  Location: [facility] ORS;  Service: Urology;  Laterality: Left;   EXTRACORPOREAL SHOCK WAVE LITHOTRIPSY Left 06/14/2020   Procedure: EXTRACORPOREAL SHOCK WAVE LITHOTRIPSY (ESWL);  Surgeon: Sondra Come, MD;  Location: [facility] ORS;  Service: Urology;  Laterality: Left;   FOOT SURGERY     RIGHT     KNEE ARTHROSCOPY W/ MENISCAL REPAIR Left    LASIK     MAXIMUM ACCESS (MAS)POSTERIOR LUMBAR INTERBODY FUSION (PLIF) 1 LEVEL N/A 04/25/2016   Procedure: LUMBAR FOUR-FIVE  MAXIMUM ACCESS (MAS) POSTERIOR LUMBAR INTERBODY FUSION (PLIF) with extension of instrumentation LUMBAR TWO-FIVE;  Surgeon: Tia Alert, MD;  Location: [facility] OR;  Service: Neurosurgery;  Laterality: N/A;   MAXIMUM ACCESS (MAS)POSTERIOR LUMBAR INTERBODY FUSION (PLIF) 2 LEVEL N/A 12/13/2015   Procedure: Lumbar two-three - Lumbar three-four MAXIMUM ACCESS (MAS) POSTERIOR LUMBAR INTERBODY FUSION (PLIF)  ;  Surgeon: Tia Alert, MD;  Location: [facility] NEURO ORS;  Service: Neurosurgery;  Laterality: N/A;   REVERSE SHOULDER ARTHROPLASTY Left 03/01/2020  Procedure: REVERSE SHOULDER ARTHROPLASTY;  Surgeon: Christena Flake, MD;  Location: [facility] ORS;  Service: Orthopedics;  Laterality: Left;   SHOULDER SURGERY     RIGHT    TONSILLECTOMY      There were no vitals filed for this visit.   Subjective Assessment - 12/03/20 1726     Subjective Back is not good. About a 9/10 currently. Had a bad night yesterday.    Pertinent History Low back pain with R LE radiating symptoms. Pain in R posterior hip with radiating down along R L5 dermatome to ankle. Pain appeared since after [place] 2021. Got a steroid injection 08/2020 which did not help. Sep 10, 2020, pt had 2 more steroid injections for her back which helped but pain returned July 2022. Went to the ER July 3 due to back pain. Pt was at the beach and fell while in waist deep water.  The day  before, pt fell backwards and her back hit the sand when pt stepped onto a dip in the sand.  Stepped on her R shoe lace the night before leaving the beach and fell to her L side onto the floor on November 15, 2020 evening while packing up. Pain has worsend since her beach trip. Denies loss of bowel control, sees a doctor for her bladder. Denies saddle anesthesia.    Patient Stated Goals Decrease pain.    Currently in Pain? Yes    Pain Score 9     Pain Onset More than a month ago                                       PT Education - 12/03/20 1822     Education Details ther-ex    [employer]) Educated Patient    Methods Explanation;Demonstration;Tactile cues;Verbal cues    Comprehension Returned demonstration;Verbalized understanding            Objectives  Therapeutic exercise  Seated manually resisted gentle L lateral shift isometrics 4x5 seconds  R posterior hip discomfort   Then to the R 10x5 seconds for 2 sets  Seated transversus abdominis contraction 10x5 seconds. R calf symptoms  Seated trunk flexion position. Decreased R calf symptoms.   Seated trunk flexion 10x5 seconds, then 2x5 seconds. Decreased R gastroc symptoms, but felt L posterior hip symptoms.   Seated B shoulder extension isometrics, hands on knees, with glute max contraction 10x5 seconds for 2 sets. L posterior hip symptoms.   Seated hip adductor folded pillow squeeze 10x5 seconds for 2 sets  Seated manually resisted clamshell isometrics 10x5 seconds   Seated manually resisted R hip extension isometrics 10x5 seconds for 3 sets  Seated manually resisted L hip extension isometrics 10x5 seconds   Seated manually resisted trunk extension isometrics 10x5 seconds for 3 sets   Improved exercise technique, movement at target joints, use of target muscles after mod verbal, visual, tactile cues.    Response to treatment Decreased pain to 8/10 after session  Clinical impression Pt  seems to demonstrate both flexion and extension preference with decreased R LE symptoms with flexion but increased low back pain L > R. No increase in lumbar symptoms with trunk extension isometrics. Decreased low back pain to 8/10 after session. Challenges to progress include chronicity of condition. Pt will benefit from continued skilled physical therapy services to decrease pain, improve strength and function.       PT Short Term  Goals - 11/26/20 1316       PT SHORT TERM GOAL #1   Title Pt will be independent with her initial HEP to decrease pain, improve strength, function, and ability to perform transfers more comfortably and with less difficulty.    Time 3    Period Weeks    Status New    Target Date 12/20/20               PT Long Term Goals - 11/26/20 1317       PT LONG TERM GOAL #1   Title Pt will have a decrease in low back pain to 6/10 or less at worst to promote ability to perform transfers, bed mobility as well as perform standing tasks more comfortably.    Baseline 20/10 low back pain at worst for the past 3 months (11/26/2020)    Time 8    Period Weeks    Status New    Target Date 01/24/21      PT LONG TERM GOAL #2   Title Patient will have a decrease in R LE pain to 6/10 or less at worst to promote ability to perform transfers, bed mobility as well as perform standing tasks more comfortably.    Baseline 20/10 R LE pain at worst to R foot for the past 3 months (11/26/2020)    Time 8    Period Weeks    Status New    Target Date 01/24/21      PT LONG TERM GOAL #3   Title Patient will improve her lumbar FOTO score by at least 10 points as a demonstration of improved function.    Baseline Lumbar FOTO: pt unable to complete (11/26/2020)    Time 8    Period Weeks    Status New    Target Date 01/24/21      PT LONG TERM GOAL #4   Title Pt will improve bilateral hip extension and abduction strength by at least 1/2 MMT grade to promote ability to stand, ambulate and  perform transfers more comfortably.    Baseline Hip extension 4-/5 R and L, hip abduction 4/5 R and L (11/26/2020)    Time 8    Period Weeks    Status New    Target Date 01/24/21                   Plan - 12/03/20 1820     Clinical Impression Statement Pt seems to demonstrate both flexion and extension preference with decreased R LE symptoms with flexion but increased low back pain L > R. No increase in lumbar symptoms with trunk extension isometrics. Decreased low back pain to 8/10 after session. Challenges to progress include chronicity of condition. Pt will benefit from continued skilled physical therapy services to decrease pain, improve strength and function.    Personal Factors and Comorbidities Comorbidity 3+;Age;Fitness;Past/Current Experience;Time since onset of injury/illness/exacerbation    Comorbidities Anxiety, asthma, depression, DM, hx of kidney stones, obesity, RA    Examination-Activity Limitations Bed Mobility;Stand;Lift;Locomotion Level;Bend;Transfers;Carry    Stability/Clinical Decision Making Evolving/Moderate complexity    Rehab Potential Fair    PT Frequency 2x / week    PT Duration 8 weeks    PT Treatment/Interventions Therapeutic exercise;Balance training;Neuromuscular re-education;Therapeutic activities;Patient/family education;Manual techniques;Dry needling;Aquatic Therapy;Electrical Stimulation;Iontophoresis 4mg /ml Dexamethasone    PT Next Visit Plan Posture, trunk,m hip strengthening, manual techniques, modalities PRN    Consulted and Agree with Plan of Care Patient  Patient will benefit from skilled therapeutic intervention in order to improve the following deficits and impairments:  Pain, Improper body mechanics, Postural dysfunction, Decreased strength, Decreased balance  Visit Diagnosis: Chronic right-sided low back pain, unspecified whether sciatica present  Radiculopathy, lumbosacral region     Problem List Patient Active  Problem List   Diagnosis Date Noted   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) 01/03/2019   Chronic cough 11/19/2016   Morbid obesity (HCC) 09/26/2016   Left ovarian cyst 05/30/2016   S/P lumbar spinal fusion 12/13/2015   Aortic calcification (HCC) 07/18/2015   Controlled type 2 diabetes mellitus without complication (HCC) 05/16/2015   Thrombocytopenia (HCC) 05/16/2015   Recurrent major depressive disorder, in full remission (HCC) 05/16/2015   Essential (primary) hypertension 05/16/2015   Fatty infiltration of liver 03/15/2015   Aortic valve stenosis, nonrheumatic 01/18/2015   Sciatica of right side 01/09/2015   Type 2 diabetes mellitus (HCC) 11/10/2014   RLS (restless legs syndrome) 08/23/2014   Neuritis or radiculitis due to rupture of lumbar intervertebral disc 06/13/2014   Degeneration of intervertebral disc of lumbar region 06/13/2014   Lumbar radiculitis 06/13/2014   Arthritis 01/23/2014   Arthritis of knee, degenerative 11/15/2013   Depression 09/29/2013   Insomnia 09/29/2013   Apnea, sleep 08/29/2013   History of nephrolithiasis 08/29/2013   Abnormal presence of protein in urine 08/29/2013   Headache, migraine 08/29/2013   BP (high blood pressure) 08/29/2013   HLD (hyperlipidemia) 08/29/2013   Allergic rhinitis 08/29/2013   Intractable migraine without aura 02/18/2013    Loralyn FreshwaterMiguel Khailee Mick PT, DPT   12/03/2020, 6:26 PM  [place] [facility] [address] [place], [place], [address] Phone: [phone]   Fax:  [phone]  Name: Jessica PutnamGail T Cole MRN: [id number] Date of Birth: [birth year]

## 2020-12-05 ENCOUNTER — Ambulatory Visit: Payer: Medicare HMO

## 2020-12-05 DIAGNOSIS — M545 Low back pain, unspecified: Secondary | ICD-10-CM

## 2020-12-05 DIAGNOSIS — M5417 Radiculopathy, lumbosacral region: Secondary | ICD-10-CM

## 2020-12-05 NOTE — Therapy (Signed)
[facility] [address] [place], [place], [address] Phone: [phone]   Fax:  [phone]  Physical Therapy Treatment  Patient Details  Name: Jessica Cole MRN: [id number] Date of Birth: 1951/02/19 Referring Provider (PT): Burman Freestone, NP   Encounter Date: 12/05/2020   PT End of Session - 12/05/20 1059     Visit Number 4    Number of Visits 17    Date for PT Re-Evaluation 01/24/21    Authorization Type 4    Authorization Time Period 10    PT Start Time 1059    PT Stop Time 1139    PT Time Calculation (min) 40 min    Activity Tolerance Patient tolerated treatment well    Behavior During Therapy WFL for tasks assessed/performed             Past Medical History:  Diagnosis Date   Anxiety    Asthma    seasonal with allergies   Colon polyps    Degenerative arthritis    Depression    Diabetes mellitus without complication (HCC)    Type II   Environmental allergies    Family history of adverse reaction to anesthesia    sister- PONV   Fatty liver    Glaucoma    Headache(784.0)    HX  MIGRAINES   History of bronchitis    History of kidney stones    History of pneumonia    Hypertension    Hypothyroidism    Obesity    OSA on CPAP    Plantar fasciitis    right   PONV (postoperative nausea and vomiting)    Renal calculi    Renal calculi    Rheumatoid arthritis (HCC)    RLS (restless legs syndrome) 08/23/2014    Past Surgical History:  Procedure Laterality Date   ABDOMINAL HYSTERECTOMY     partial   APPENDECTOMY     Arthroscopic surgery, knee Left    BREAST BIOPSY Right    several   BREAST BIOPSY Right 03/11/2017   u/s bx neg   BUNIONECTOMY     LEFT    COLONOSCOPY W/ POLYPECTOMY     COLONOSCOPY WITH PROPOFOL N/A 08/14/2017   Procedure: COLONOSCOPY WITH PROPOFOL;  Surgeon: Christena Deem, MD;  Location: [facility] ENDOSCOPY;  Service: Endoscopy;  Laterality: N/A;   EXTRACORPOREAL SHOCK  WAVE LITHOTRIPSY Left 05/14/2017   Procedure: EXTRACORPOREAL SHOCK WAVE LITHOTRIPSY (ESWL);  Surgeon: Riki Altes, MD;  Location: [facility] ORS;  Service: Urology;  Laterality: Left;   EXTRACORPOREAL SHOCK WAVE LITHOTRIPSY Left 06/14/2020   Procedure: EXTRACORPOREAL SHOCK WAVE LITHOTRIPSY (ESWL);  Surgeon: Sondra Come, MD;  Location: [facility] ORS;  Service: Urology;  Laterality: Left;   FOOT SURGERY     RIGHT     KNEE ARTHROSCOPY W/ MENISCAL REPAIR Left    LASIK     MAXIMUM ACCESS (MAS)POSTERIOR LUMBAR INTERBODY FUSION (PLIF) 1 LEVEL N/A 04/25/2016   Procedure: LUMBAR FOUR-FIVE  MAXIMUM ACCESS (MAS) POSTERIOR LUMBAR INTERBODY FUSION (PLIF) with extension of instrumentation LUMBAR TWO-FIVE;  Surgeon: Tia Alert, MD;  Location: [facility] OR;  Service: Neurosurgery;  Laterality: N/A;   MAXIMUM ACCESS (MAS)POSTERIOR LUMBAR INTERBODY FUSION (PLIF) 2 LEVEL N/A 12/13/2015   Procedure: Lumbar two-three - Lumbar three-four MAXIMUM ACCESS (MAS) POSTERIOR LUMBAR INTERBODY FUSION (PLIF)  ;  Surgeon: Tia Alert, MD;  Location: [facility] NEURO ORS;  Service: Neurosurgery;  Laterality: N/A;   REVERSE SHOULDER ARTHROPLASTY Left 03/01/2020  Procedure: REVERSE SHOULDER ARTHROPLASTY;  Surgeon: Christena Flake, MD;  Location: [facility] ORS;  Service: Orthopedics;  Laterality: Left;   SHOULDER SURGERY     RIGHT    TONSILLECTOMY      There were no vitals filed for this visit.   Subjective Assessment - 12/05/20 1102     Subjective Back is maybe a tiny bit better. 3-4/10 currently. Has also not been able to sleep for the last couple of weeks.    Pertinent History Low back pain with R LE radiating symptoms. Pain in R posterior hip with radiating down along R L5 dermatome to ankle. Pain appeared since after [place] 2021. Got a steroid injection 08/2020 which did not help. Sep 10, 2020, pt had 2 more steroid injections for her back which helped but pain returned July 2022. Went to the ER July 3 due to back pain. Pt was at the beach  and fell while in waist deep water.  The day before, pt fell backwards and her back hit the sand when pt stepped onto a dip in the sand.  Stepped on her R shoe lace the night before leaving the beach and fell to her L side onto the floor on November 15, 2020 evening while packing up. Pain has worsend since her beach trip. Denies loss of bowel control, sees a doctor for her bladder. Denies saddle anesthesia.    Patient Stated Goals Decrease pain.    Currently in Pain? Yes    Pain Score 4     Pain Onset More than a month ago                                       PT Education - 12/05/20 1107     Education Details ther-ex, HEP    Person(s) Educated Patient    Methods Explanation;Demonstration;Tactile cues;Verbal cues;Handout    Comprehension Returned demonstration;Verbalized understanding            Objectives    Medbridge Access Code [facility]    Therapeutic exercise   Reclined position with knees propped with bolster:   Transversus abdominis contraction 10x5 seconds for 3 sets   Scapular retraction 10x5 seconds for 3 sets   Glute max squeeze comfortably 10x5 seconds for 3 sets  Hip extension isometrics R 10x5 seconds for 3 sets L 10x5 seconds for 3 sets  Shoulder extension isometrics 10x5 seconds for 2 sets  Manually resisted clamshells 6x5 seconds   L posterior thigh symptoms which eases with rest.   B ankle DF/PF 10x3 to promote neural mobility.    Seated manually resisted R hip extension isometrics 10x5 seconds for 3 sets     Improved exercise technique, movement at target joints, use of target muscles after mod verbal, visual, tactile cues.      Response to treatment Pt tolerated session well without aggravation of symptoms.     Clinical impression Exercises performed in reclined position today to decrease stress to low back when performing exercises secondary to both flexion and extension preference. Pt observed to be more  comfortable. Continued working on core and glute strengthening to help decrease stress to low back. Pt tolerated session well without aggravation of symptoms. Pt will benefit from continued skilled physical therapy services to decrease pain, improve strength and function.         PT Short Term Goals - 11/26/20 1316  PT SHORT TERM GOAL #1   Title Pt will be independent with her initial HEP to decrease pain, improve strength, function, and ability to perform transfers more comfortably and with less difficulty.    Time 3    Period Weeks    Status New    Target Date 12/20/20               PT Long Term Goals - 11/26/20 1317       PT LONG TERM GOAL #1   Title Pt will have a decrease in low back pain to 6/10 or less at worst to promote ability to perform transfers, bed mobility as well as perform standing tasks more comfortably.    Baseline 20/10 low back pain at worst for the past 3 months (11/26/2020)    Time 8    Period Weeks    Status New    Target Date 01/24/21      PT LONG TERM GOAL #2   Title Patient will have a decrease in R LE pain to 6/10 or less at worst to promote ability to perform transfers, bed mobility as well as perform standing tasks more comfortably.    Baseline 20/10 R LE pain at worst to R foot for the past 3 months (11/26/2020)    Time 8    Period Weeks    Status New    Target Date 01/24/21      PT LONG TERM GOAL #3   Title Patient will improve her lumbar FOTO score by at least 10 points as a demonstration of improved function.    Baseline Lumbar FOTO: pt unable to complete (11/26/2020)    Time 8    Period Weeks    Status New    Target Date 01/24/21      PT LONG TERM GOAL #4   Title Pt will improve bilateral hip extension and abduction strength by at least 1/2 MMT grade to promote ability to stand, ambulate and perform transfers more comfortably.    Baseline Hip extension 4-/5 R and L, hip abduction 4/5 R and L (11/26/2020)    Time 8    Period  Weeks    Status New    Target Date 01/24/21                   Plan - 12/05/20 1107     Clinical Impression Statement Exercises performed in reclined position today to decrease stress to low back when performing exercises secondary to both flexion and extension preference. Pt observed to be more comfortable. Continued working on core and glute strengthening to help decrease stress to low back. Pt tolerated session well without aggravation of symptoms. Pt will benefit from continued skilled physical therapy services to decrease pain, improve strength and function.    Personal Factors and Comorbidities Comorbidity 3+;Age;Fitness;Past/Current Experience;Time since onset of injury/illness/exacerbation    Comorbidities Anxiety, asthma, depression, DM, hx of kidney stones, obesity, RA    Examination-Activity Limitations Bed Mobility;Stand;Lift;Locomotion Level;Bend;Transfers;Carry    Stability/Clinical Decision Making Evolving/Moderate complexity    Rehab Potential Fair    PT Frequency 2x / week    PT Duration 8 weeks    PT Treatment/Interventions Therapeutic exercise;Balance training;Neuromuscular re-education;Therapeutic activities;Patient/family education;Manual techniques;Dry needling;Aquatic Therapy;Electrical Stimulation;Iontophoresis 4mg /ml Dexamethasone    PT Next Visit Plan Posture, trunk,m hip strengthening, manual techniques, modalities PRN    Consulted and Agree with Plan of Care Patient             Patient will benefit  from skilled therapeutic intervention in order to improve the following deficits and impairments:  Pain, Improper body mechanics, Postural dysfunction, Decreased strength, Decreased balance  Visit Diagnosis: Chronic right-sided low back pain, unspecified whether sciatica present  Radiculopathy, lumbosacral region     Problem List Patient Active Problem List   Diagnosis Date Noted   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) 01/03/2019   Chronic cough 11/19/2016   Morbid obesity (HCC) 09/26/2016   Left ovarian cyst 05/30/2016   S/P lumbar spinal fusion 12/13/2015   Aortic calcification (HCC) 07/18/2015   Controlled type 2 diabetes mellitus without complication (HCC) 05/16/2015   Thrombocytopenia (HCC) 05/16/2015   Recurrent major depressive disorder, in full remission (HCC) 05/16/2015   Essential (primary) hypertension 05/16/2015   Fatty infiltration of liver 03/15/2015   Aortic valve stenosis, nonrheumatic 01/18/2015   Sciatica of right side 01/09/2015   Type 2 diabetes mellitus (HCC) 11/10/2014   RLS (restless legs syndrome) 08/23/2014   Neuritis or radiculitis due to rupture of lumbar intervertebral disc 06/13/2014   Degeneration of intervertebral disc of lumbar region 06/13/2014   Lumbar radiculitis 06/13/2014   Arthritis 01/23/2014   Arthritis of knee, degenerative 11/15/2013   Depression 09/29/2013   Insomnia 09/29/2013   Apnea, sleep 08/29/2013   History of nephrolithiasis 08/29/2013   Abnormal presence of protein in urine 08/29/2013   Headache, migraine 08/29/2013   BP (high blood pressure) 08/29/2013   HLD (hyperlipidemia) 08/29/2013   Allergic rhinitis 08/29/2013   Intractable migraine without aura 02/18/2013    Loralyn Freshwater PT, DPT   12/05/2020, 11:44 AM  [place] [facility] [address] [place], [address] Phone: [phone]   Fax:  [phone]  Name: Jessica Cole MRN: [id number] Date of Birth: 09-27-1950

## 2020-12-11 ENCOUNTER — Ambulatory Visit: Payer: Medicare HMO

## 2020-12-11 DIAGNOSIS — M5417 Radiculopathy, lumbosacral region: Secondary | ICD-10-CM

## 2020-12-11 DIAGNOSIS — M545 Low back pain, unspecified: Secondary | ICD-10-CM

## 2020-12-11 DIAGNOSIS — G8929 Other chronic pain: Secondary | ICD-10-CM

## 2020-12-11 NOTE — Therapy (Signed)
[place] [facility] [address] [place], [place], [address] Phone: [phone]   Fax:  [phone]  Physical Therapy Treatment  Patient Details  Name: Jessica Cole MRN: [id number] Date of Birth: 12/25/1950 Referring Provider (PT): Burman Freestone, NP   Encounter Date: 12/11/2020   PT End of Session - 12/11/20 1545     Visit Number 5    Number of Visits 17    Date for PT Re-Evaluation 01/24/21    Authorization Type 5    Authorization Time Period 10    PT Start Time 1546    PT Stop Time 1631    PT Time Calculation (min) 45 min    Activity Tolerance Patient tolerated treatment well    Behavior During Therapy [facility] for tasks assessed/performed             Past Medical History:  Diagnosis Date   Anxiety    Asthma    seasonal with allergies   Colon polyps    Degenerative arthritis    Depression    Diabetes mellitus without complication (HCC)    Type II   Environmental allergies    Family history of adverse reaction to anesthesia    sister- PONV   Fatty liver    Glaucoma    Headache(784.0)    HX  MIGRAINES   History of bronchitis    History of kidney stones    History of pneumonia    Hypertension    Hypothyroidism    Obesity    OSA on CPAP    Plantar fasciitis    right   PONV (postoperative nausea and vomiting)    Renal calculi    Renal calculi    Rheumatoid arthritis (HCC)    RLS (restless legs syndrome) 08/23/2014    Past Surgical History:  Procedure Laterality Date   ABDOMINAL HYSTERECTOMY     partial   APPENDECTOMY     Arthroscopic surgery, knee Left    BREAST BIOPSY Right    several   BREAST BIOPSY Right 03/11/2017   u/s bx neg   BUNIONECTOMY     LEFT    COLONOSCOPY W/ POLYPECTOMY     COLONOSCOPY WITH PROPOFOL N/A 08/14/2017   Procedure: COLONOSCOPY WITH PROPOFOL;  Surgeon: Christena Deem, MD;  Location: [facility] ENDOSCOPY;  Service: Endoscopy;  Laterality: N/A;   EXTRACORPOREAL SHOCK  WAVE LITHOTRIPSY Left 05/14/2017   Procedure: EXTRACORPOREAL SHOCK WAVE LITHOTRIPSY (ESWL);  Surgeon: Riki Altes, MD;  Location: [facility] ORS;  Service: Urology;  Laterality: Left;   EXTRACORPOREAL SHOCK WAVE LITHOTRIPSY Left 06/14/2020   Procedure: EXTRACORPOREAL SHOCK WAVE LITHOTRIPSY (ESWL);  Surgeon: Sondra Come, MD;  Location: [facility] ORS;  Service: Urology;  Laterality: Left;   FOOT SURGERY     RIGHT     KNEE ARTHROSCOPY W/ MENISCAL REPAIR Left    LASIK     MAXIMUM ACCESS (MAS)POSTERIOR LUMBAR INTERBODY FUSION (PLIF) 1 LEVEL N/A 04/25/2016   Procedure: LUMBAR FOUR-FIVE  MAXIMUM ACCESS (MAS) POSTERIOR LUMBAR INTERBODY FUSION (PLIF) with extension of instrumentation LUMBAR TWO-FIVE;  Surgeon: Tia Alert, MD;  Location: [facility] OR;  Service: Neurosurgery;  Laterality: N/A;   MAXIMUM ACCESS (MAS)POSTERIOR LUMBAR INTERBODY FUSION (PLIF) 2 LEVEL N/A 12/13/2015   Procedure: Lumbar two-three - Lumbar three-four MAXIMUM ACCESS (MAS) POSTERIOR LUMBAR INTERBODY FUSION (PLIF)  ;  Surgeon: Tia Alert, MD;  Location: [facility] NEURO ORS;  Service: Neurosurgery;  Laterality: N/A;   REVERSE SHOULDER ARTHROPLASTY Left 03/01/2020  Procedure: REVERSE SHOULDER ARTHROPLASTY;  Surgeon: Christena Flake, MD;  Location: [facility] ORS;  Service: Orthopedics;  Laterality: Left;   SHOULDER SURGERY     RIGHT    TONSILLECTOMY      There were no vitals filed for this visit.   Subjective Assessment - 12/11/20 1548     Subjective Back is not good. Has been hurting her off and on all weekend. 11/10 currently. Back was not too bad after last session. Saturday and Sunday back bothered her a lot. Does not know what she did. Sometimes when she picks up the cat, does dishes or sweeps the floor her back bothers her. Has not taken and pain medication. Last time she took pain medication was about a week ago.    Pertinent History Low back pain with R LE radiating symptoms. Pain in R posterior hip with radiating down along R L5 dermatome to  ankle. Pain appeared since after [place] 2021. Got a steroid injection 08/2020 which did not help. Sep 10, 2020, pt had 2 more steroid injections for her back which helped but pain returned July 2022. Went to the ER July 3 due to back pain. Pt was at the beach and fell while in waist deep water.  The day before, pt fell backwards and her back hit the sand when pt stepped onto a dip in the sand.  Stepped on her R shoe lace the night before leaving the beach and fell to her L side onto the floor on November 15, 2020 evening while packing up. Pain has worsend since her beach trip. Denies loss of bowel control, sees a doctor for her bladder. Denies saddle anesthesia.    Patient Stated Goals Decrease pain.    Currently in Pain? Yes    Pain Score 10-Worst pain ever    Pain Onset More than a month ago                                       PT Education - 12/11/20 1606     Education Details ther-ex    Person(s) Educated Patient    Methods Explanation;Demonstration;Tactile cues;Verbal cues    Comprehension Returned demonstration;Verbalized understanding            Objectives     Medbridge Access Code [email or web address]   Manual therapy STM R piriformis muscle in supine  No change in symptoms in R LE    Therapeutic exercise   Reclined position with knees propped with bolster:               Transversus abdominis contraction 10x5 seconds for  2 sets               Scapular retraction 10x5 seconds for 1 sets              Glute max squeeze comfortably 10x5 seconds for 1 sets  Hip extension isometrics, knees on bolster R 10x5 seconds for 3 sets.  L 10x5 seconds. Increased R hip symptoms    B ankle DF/PF 10x3 to promote neural mobility.     Manually resisted hip adduction isometrics 10x3 with 5 second holds. Most comfortable exercise so far observed  R hip IR 10x5 seconds holds for 3 sets  Seated hip adduction pillow squeeze isometrics 10x5 seconds   Seated R hip  IR 10x2        Improved exercise technique, movement at target joints,  use of target muscles after mod verbal, visual, tactile cues.      Response to treatment Decreased back pain in sitting to 1/10 after session. Low back and R hip feels better while walking reported by pt as well.      Clinical impression Decreased R posterior hip and low back pain with treatment to decrease R pirformis muscle and promote R hip IR. Pt will benefit from continued skilled physical therapy services to decrease pain, improve strength and function.        PT Short Term Goals - 11/26/20 1316       PT SHORT TERM GOAL #1   Title Pt will be independent with her initial HEP to decrease pain, improve strength, function, and ability to perform transfers more comfortably and with less difficulty.    Time 3    Period Weeks    Status New    Target Date 12/20/20               PT Long Term Goals - 11/26/20 1317       PT LONG TERM GOAL #1   Title Pt will have a decrease in low back pain to 6/10 or less at worst to promote ability to perform transfers, bed mobility as well as perform standing tasks more comfortably.    Baseline 20/10 low back pain at worst for the past 3 months (11/26/2020)    Time 8    Period Weeks    Status New    Target Date 01/24/21      PT LONG TERM GOAL #2   Title Patient will have a decrease in R LE pain to 6/10 or less at worst to promote ability to perform transfers, bed mobility as well as perform standing tasks more comfortably.    Baseline 20/10 R LE pain at worst to R foot for the past 3 months (11/26/2020)    Time 8    Period Weeks    Status New    Target Date 01/24/21      PT LONG TERM GOAL #3   Title Patient will improve her lumbar FOTO score by at least 10 points as a demonstration of improved function.    Baseline Lumbar FOTO: pt unable to complete (11/26/2020)    Time 8    Period Weeks    Status New    Target Date 01/24/21      PT LONG TERM GOAL #4    Title Pt will improve bilateral hip extension and abduction strength by at least 1/2 MMT grade to promote ability to stand, ambulate and perform transfers more comfortably.    Baseline Hip extension 4-/5 R and L, hip abduction 4/5 R and L (11/26/2020)    Time 8    Period Weeks    Status New    Target Date 01/24/21                   Plan - 12/11/20 1545     Clinical Impression Statement Decreased R posterior hip and low back pain with treatment to decrease R pirformis muscle and promote R hip IR. Pt will benefit from continued skilled physical therapy services to decrease pain, improve strength and function.    Personal Factors and Comorbidities Comorbidity 3+;Age;Fitness;Past/Current Experience;Time since onset of injury/illness/exacerbation    Comorbidities Anxiety, asthma, depression, DM, hx of kidney stones, obesity, RA    Examination-Activity Limitations Bed Mobility;Stand;Lift;Locomotion Level;Bend;Transfers;Carry    Stability/Clinical Decision Making Evolving/Moderate complexity    Rehab Potential  Fair    PT Frequency 2x / week    PT Duration 8 weeks    PT Treatment/Interventions Therapeutic exercise;Balance training;Neuromuscular re-education;Therapeutic activities;Patient/family education;Manual techniques;Dry needling;Aquatic Therapy;Electrical Stimulation;Iontophoresis 4mg /ml Dexamethasone    PT Next Visit Plan Posture, trunk,m hip strengthening, manual techniques, modalities PRN    Consulted and Agree with Plan of Care Patient             Patient will benefit from skilled therapeutic intervention in order to improve the following deficits and impairments:  Pain, Improper body mechanics, Postural dysfunction, Decreased strength, Decreased balance  Visit Diagnosis: Chronic right-sided low back pain, unspecified whether sciatica present  Radiculopathy, lumbosacral region     Problem List Patient Active Problem List   Diagnosis Date Noted   Mild  nonproliferative diabetic retinopathy of both eyes without macular edema associated with type 2 diabetes mellitus (HCC) 01/03/2019   Chronic cough 11/19/2016   Morbid obesity (HCC) 09/26/2016   Left ovarian cyst 05/30/2016   S/P lumbar spinal fusion 12/13/2015   Aortic calcification (HCC) 07/18/2015   Controlled type 2 diabetes mellitus without complication (HCC) 05/16/2015   Thrombocytopenia (HCC) 05/16/2015   Recurrent major depressive disorder, in full remission (HCC) 05/16/2015   Essential (primary) hypertension 05/16/2015   Fatty infiltration of liver 03/15/2015   Aortic valve stenosis, nonrheumatic 01/18/2015   Sciatica of right side 01/09/2015   Type 2 diabetes mellitus (HCC) 11/10/2014   RLS (restless legs syndrome) 08/23/2014   Neuritis or radiculitis due to rupture of lumbar intervertebral disc 06/13/2014   Degeneration of intervertebral disc of lumbar region 06/13/2014   Lumbar radiculitis 06/13/2014   Arthritis 01/23/2014   Arthritis of knee, degenerative 11/15/2013   Depression 09/29/2013   Insomnia 09/29/2013   Apnea, sleep 08/29/2013   History of nephrolithiasis 08/29/2013   Abnormal presence of protein in urine 08/29/2013   Headache, migraine 08/29/2013   BP (high blood pressure) 08/29/2013   HLD (hyperlipidemia) 08/29/2013   Allergic rhinitis 08/29/2013   Intractable migraine without aura 02/18/2013    Loralyn FreshwaterMiguel Azar South PT, DPT   12/11/2020, 5:37 PM  [facility] [facility] [address] [place], [place], [address] Phone: [phone]   Fax:  [phone]  Name: Nobie PutnamGail T Burdick MRN: [id number] Date of Birth: 1950-11-27

## 2020-12-13 ENCOUNTER — Ambulatory Visit: Payer: Medicare HMO

## 2020-12-13 DIAGNOSIS — M545 Low back pain, unspecified: Secondary | ICD-10-CM | POA: Diagnosis not present

## 2020-12-13 DIAGNOSIS — M5417 Radiculopathy, lumbosacral region: Secondary | ICD-10-CM

## 2020-12-13 DIAGNOSIS — G8929 Other chronic pain: Secondary | ICD-10-CM

## 2020-12-13 NOTE — Therapy (Signed)
[place] [facility] [address] [place], [place], [address] Phone: [phone]   Fax:  [phone]  Physical Therapy Treatment  Patient Details  Name: Jessica Cole MRN: [id number] Date of Birth: [birth year] Referring Provider (PT): Burman Freestone, NP   Encounter Date: 12/13/2020   PT End of Session - 12/13/20 0842     Visit Number 6    Number of Visits 17    Date for PT Re-Evaluation 01/24/21    Authorization Type 6    Authorization Time Period 10    PT Start Time 0842    PT Stop Time 0939    PT Time Calculation (min) 57 min    Activity Tolerance Patient tolerated treatment well    Behavior During Therapy [facility] for tasks assessed/performed             Past Medical History:  Diagnosis Date   Anxiety    Asthma    seasonal with allergies   Colon polyps    Degenerative arthritis    Depression    Diabetes mellitus without complication (HCC)    Type II   Environmental allergies    Family history of adverse reaction to anesthesia    sister- PONV   Fatty liver    Glaucoma    Headache(784.0)    HX  MIGRAINES   History of bronchitis    History of kidney stones    History of pneumonia    Hypertension    Hypothyroidism    Obesity    OSA on CPAP    Plantar fasciitis    right   PONV (postoperative nausea and vomiting)    Renal calculi    Renal calculi    Rheumatoid arthritis (HCC)    RLS (restless legs syndrome) 08/23/2014    Past Surgical History:  Procedure Laterality Date   ABDOMINAL HYSTERECTOMY     partial   APPENDECTOMY     Arthroscopic surgery, knee Left    BREAST BIOPSY Right    several   BREAST BIOPSY Right 03/11/2017   u/s bx neg   BUNIONECTOMY     LEFT    COLONOSCOPY W/ POLYPECTOMY     COLONOSCOPY WITH PROPOFOL N/A 08/14/2017   Procedure: COLONOSCOPY WITH PROPOFOL;  Surgeon: Christena Deem, MD;  Location: [facility] ENDOSCOPY;  Service: Endoscopy;  Laterality: N/A;   EXTRACORPOREAL SHOCK  WAVE LITHOTRIPSY Left 05/14/2017   Procedure: EXTRACORPOREAL SHOCK WAVE LITHOTRIPSY (ESWL);  Surgeon: Riki Altes, MD;  Location: [facility] ORS;  Service: Urology;  Laterality: Left;   EXTRACORPOREAL SHOCK WAVE LITHOTRIPSY Left 06/14/2020   Procedure: EXTRACORPOREAL SHOCK WAVE LITHOTRIPSY (ESWL);  Surgeon: Sondra Come, MD;  Location: [facility] ORS;  Service: Urology;  Laterality: Left;   FOOT SURGERY     RIGHT     KNEE ARTHROSCOPY W/ MENISCAL REPAIR Left    LASIK     MAXIMUM ACCESS (MAS)POSTERIOR LUMBAR INTERBODY FUSION (PLIF) 1 LEVEL N/A 04/25/2016   Procedure: LUMBAR FOUR-FIVE  MAXIMUM ACCESS (MAS) POSTERIOR LUMBAR INTERBODY FUSION (PLIF) with extension of instrumentation LUMBAR TWO-FIVE;  Surgeon: Tia Alert, MD;  Location: [facility] OR;  Service: Neurosurgery;  Laterality: N/A;   MAXIMUM ACCESS (MAS)POSTERIOR LUMBAR INTERBODY FUSION (PLIF) 2 LEVEL N/A 12/13/2015   Procedure: Lumbar two-three - Lumbar three-four MAXIMUM ACCESS (MAS) POSTERIOR LUMBAR INTERBODY FUSION (PLIF)  ;  Surgeon: Tia Alert, MD;  Location: [facility] NEURO ORS;  Service: Neurosurgery;  Laterality: N/A;   REVERSE SHOULDER ARTHROPLASTY Left 03/01/2020  Procedure: REVERSE SHOULDER ARTHROPLASTY;  Surgeon: Christena Flake, MD;  Location: [facility] ORS;  Service: Orthopedics;  Laterality: Left;   SHOULDER SURGERY     RIGHT    TONSILLECTOMY      There were no vitals filed for this visit.   Subjective Assessment - 12/13/20 0843     Subjective L low back and posterior hip is bothering her as well. Pain started yesterday, not long after last session when seh got home. R low back/posterior hip 8/10, and L low back/posterior hip 3-4/10 currently. Sciatic 8/10 R, 2/10 L currently. Took oxycodone before bed yesterday.    Pertinent History Low back pain with R LE radiating symptoms. Pain in R posterior hip with radiating down along R L5 dermatome to ankle. Pain appeared since after [place] 2021. Got a steroid injection 08/2020 which did not help.  Sep 10, 2020, pt had 2 more steroid injections for her back which helped but pain returned July 2022. Went to the ER July 3 due to back pain. Pt was at the beach and fell while in waist deep water.  The day before, pt fell backwards and her back hit the sand when pt stepped onto a dip in the sand.  Stepped on her R shoe lace the night before leaving the beach and fell to her L side onto the floor on November 15, 2020 evening while packing up. Pain has worsend since her beach trip. Denies loss of bowel control, sees a doctor for her bladder. Denies saddle anesthesia.    Patient Stated Goals Decrease pain.    Currently in Pain? Yes    Pain Score 8     Pain Onset More than a month ago                                       PT Education - 12/13/20 0935     Education Details ther-ex, HEP    Person(s) Educated Patient    Methods Explanation;Demonstration;Tactile cues;Verbal cues;Handout    Comprehension Returned demonstration;Verbalized understanding           Objectives     Medbridge Access Code [facility]      Therapeutic exercise   Seated hip adduction folded pillow squeeze 10x5 seconds   Then gently manually resisted 10x5 seconds, no glute max or transversus abdominis activation secondary to increased symptoms.   Seated hip extension isometrics   L 8x5 seconds. Increased symptoms  R 10x5 seconds for 3 sets. Very slight decrease in symptoms. Decreased R sitting lateral shift posture observed  Sitting posture: R lateral shift.   Seated manually resitsed L lateral shift to promote more neutral posture 10x5 seconds  Seated B ankle DF/PF 10x3  Seated leg extension   R 10x3   L 10x3  Seated hip ER/IR  R 10x3  L 10x3  Decreased pain per pt.  Then repeated again after rest to promote more neural flossing:  Seated B ankle DF/PF 10x3  Seated leg extension   R 10x3   L 10x3  Seated hip ER/IR  R 10x3  L 10x3      Improved exercise technique,  movement at target joints, use of target muscles after mod verbal, visual, tactile cues.     Manual therapy  Standing, bent over on table for comfort STM L posterior hip and lumbar paraspinal muscles to decrease tension   Response to treatment Decreased B hip/low  back pain and LE symptoms in sitting to 1/10 after neural flossing.      Clinical impression Irritable symptoms observed. Decreased pain with LE neural flossing movements distal to low back. Pt was recommended to perform neural flossing exercises only for the next week at home as well as use ice to low back to help calm down symptoms. Pt verbalized understanding. Pt will benefit from continued skilled physical therapy services to decrease pain, improve strength and function.         PT Short Term Goals - 11/26/20 1316       PT SHORT TERM GOAL #1   Title Pt will be independent with her initial HEP to decrease pain, improve strength, function, and ability to perform transfers more comfortably and with less difficulty.    Time 3    Period Weeks    Status New    Target Date 12/20/20               PT Long Term Goals - 11/26/20 1317       PT LONG TERM GOAL #1   Title Pt will have a decrease in low back pain to 6/10 or less at worst to promote ability to perform transfers, bed mobility as well as perform standing tasks more comfortably.    Baseline 20/10 low back pain at worst for the past 3 months (11/26/2020)    Time 8    Period Weeks    Status New    Target Date 01/24/21      PT LONG TERM GOAL #2   Title Patient will have a decrease in R LE pain to 6/10 or less at worst to promote ability to perform transfers, bed mobility as well as perform standing tasks more comfortably.    Baseline 20/10 R LE pain at worst to R foot for the past 3 months (11/26/2020)    Time 8    Period Weeks    Status New    Target Date 01/24/21      PT LONG TERM GOAL #3   Title Patient will improve her lumbar FOTO score by at least 10  points as a demonstration of improved function.    Baseline Lumbar FOTO: pt unable to complete (11/26/2020)    Time 8    Period Weeks    Status New    Target Date 01/24/21      PT LONG TERM GOAL #4   Title Pt will improve bilateral hip extension and abduction strength by at least 1/2 MMT grade to promote ability to stand, ambulate and perform transfers more comfortably.    Baseline Hip extension 4-/5 R and L, hip abduction 4/5 R and L (11/26/2020)    Time 8    Period Weeks    Status New    Target Date 01/24/21                   Plan - 12/13/20 0841     Clinical Impression Statement Irritable symptoms observed. Decreased pain with LE neural flossing movements distal to low back. Pt was recommended to perform neural flossing exercises only for the next week at home as well as use ice to low back to help calm down symptoms. Pt verbalized understanding. Pt will benefit from continued skilled physical therapy services to decrease pain, improve strength and function.    Personal Factors and Comorbidities Comorbidity 3+;Age;Fitness;Past/Current Experience;Time since onset of injury/illness/exacerbation    Comorbidities Anxiety, asthma, depression, DM, hx of kidney stones, obesity,  RA    Examination-Activity Limitations Bed Mobility;Stand;Lift;Locomotion Level;Bend;Transfers;Carry    Stability/Clinical Decision Making Evolving/Moderate complexity    Rehab Potential Fair    PT Frequency 2x / week    PT Duration 8 weeks    PT Treatment/Interventions Therapeutic exercise;Balance training;Neuromuscular re-education;Therapeutic activities;Patient/family education;Manual techniques;Dry needling;Aquatic Therapy;Electrical Stimulation;Iontophoresis 4mg /ml Dexamethasone    PT Next Visit Plan Posture, trunk,m hip strengthening, manual techniques, modalities PRN    Consulted and Agree with Plan of Care Patient             Patient will benefit from skilled therapeutic intervention in order to  improve the following deficits and impairments:  Pain, Improper body mechanics, Postural dysfunction, Decreased strength, Decreased balance  Visit Diagnosis: Chronic right-sided low back pain, unspecified whether sciatica present  Radiculopathy, lumbosacral region     Problem List Patient Active Problem List   Diagnosis Date Noted   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) 01/03/2019   Chronic cough 11/19/2016   Morbid obesity (HCC) 09/26/2016   Left ovarian cyst 05/30/2016   S/P lumbar spinal fusion 12/13/2015   Aortic calcification (HCC) 07/18/2015   Controlled type 2 diabetes mellitus without complication (HCC) 05/16/2015   Thrombocytopenia (HCC) 05/16/2015   Recurrent major depressive disorder, in full remission (HCC) 05/16/2015   Essential (primary) hypertension 05/16/2015   Fatty infiltration of liver 03/15/2015   Aortic valve stenosis, nonrheumatic 01/18/2015   Sciatica of right side 01/09/2015   Type 2 diabetes mellitus (HCC) 11/10/2014   RLS (restless legs syndrome) 08/23/2014   Neuritis or radiculitis due to rupture of lumbar intervertebral disc 06/13/2014   Degeneration of intervertebral disc of lumbar region 06/13/2014   Lumbar radiculitis 06/13/2014   Arthritis 01/23/2014   Arthritis of knee, degenerative 11/15/2013   Depression 09/29/2013   Insomnia 09/29/2013   Apnea, sleep 08/29/2013   History of nephrolithiasis 08/29/2013   Abnormal presence of protein in urine 08/29/2013   Headache, migraine 08/29/2013   BP (high blood pressure) 08/29/2013   HLD (hyperlipidemia) 08/29/2013   Allergic rhinitis 08/29/2013   Intractable migraine without aura 02/18/2013   02/20/2013 PT, DPT   12/13/2020, 10:04 AM  [place] [facility] [address], [address], [place] Phone: [phone]   Fax:  [phone]  Name: JAZZ BIDDY MRN: [name] Date of Birth: 07/29/1950

## 2020-12-13 NOTE — Patient Instructions (Addendum)
Access Code: [facility] URL: [email or web address] Date: 12/13/2020 Prepared by: Loralyn Freshwater  Exercises   Pt to do only these exercises for the next week to promote gentle neural flossing. Pt verbalized understanding.    Seated Hip Internal Rotation AROM - 1 x daily - 7 x weekly - 3 sets - 10 reps Seated Hip External Rotation AROM - 1 x daily - 7 x weekly - 3 sets - 10 reps Seated Heel Toe Raises - 1 x daily - 7 x weekly - 3 sets - 10 reps LE neural flossing - 1 x daily - 7 x weekly - 3 sets - 10 reps

## 2020-12-18 ENCOUNTER — Ambulatory Visit: Payer: Medicare HMO

## 2020-12-18 DIAGNOSIS — M545 Low back pain, unspecified: Secondary | ICD-10-CM | POA: Diagnosis not present

## 2020-12-18 DIAGNOSIS — G8929 Other chronic pain: Secondary | ICD-10-CM

## 2020-12-18 DIAGNOSIS — M5417 Radiculopathy, lumbosacral region: Secondary | ICD-10-CM

## 2020-12-18 NOTE — Therapy (Signed)
[place] [facility] [address], [place], [address] Phone: [phone]   Fax:  [phone]  Physical Therapy Treatment  Patient Details  Name: Jessica Cole MRN: [id number] Date of Birth: 06-Mar-1951 Referring Provider (PT): Burman Freestone, NP   Encounter Date: 12/18/2020   PT End of Session - 12/18/20 1726     Visit Number 7    Number of Visits 17    Date for PT Re-Evaluation 01/24/21    Authorization Type [employer]    Authorization Time Period 11/26/20-01/24/21    Progress Note Due on Visit 10    PT Start Time 1721    PT Stop Time 1800    PT Time Calculation (min) 39 min    Activity Tolerance Patient tolerated treatment well;No increased pain;Patient limited by pain    Behavior During Therapy [facility] for tasks assessed/performed             Past Medical History:  Diagnosis Date   Anxiety    Asthma    seasonal with allergies   Colon polyps    Degenerative arthritis    Depression    Diabetes mellitus without complication (HCC)    Type II   Environmental allergies    Family history of adverse reaction to anesthesia    sister- PONV   Fatty liver    Glaucoma    Headache(784.0)    HX  MIGRAINES   History of bronchitis    History of kidney stones    History of pneumonia    Hypertension    Hypothyroidism    Obesity    OSA on CPAP    Plantar fasciitis    right   PONV (postoperative nausea and vomiting)    Renal calculi    Renal calculi    Rheumatoid arthritis (HCC)    RLS (restless legs syndrome) 08/23/2014    Past Surgical History:  Procedure Laterality Date   ABDOMINAL HYSTERECTOMY     partial   APPENDECTOMY     Arthroscopic surgery, knee Left    BREAST BIOPSY Right    several   BREAST BIOPSY Right 03/11/2017   u/s bx neg   BUNIONECTOMY     LEFT    COLONOSCOPY W/ POLYPECTOMY     COLONOSCOPY WITH PROPOFOL N/A 08/14/2017   Procedure: COLONOSCOPY WITH PROPOFOL;  Surgeon: Christena Deem, MD;  Location: [facility] ENDOSCOPY;  Service: Endoscopy;  Laterality: N/A;   EXTRACORPOREAL SHOCK WAVE LITHOTRIPSY Left 05/14/2017   Procedure: EXTRACORPOREAL SHOCK WAVE LITHOTRIPSY (ESWL);  Surgeon: Riki Altes, MD;  Location: [facility] ORS;  Service: Urology;  Laterality: Left;   EXTRACORPOREAL SHOCK WAVE LITHOTRIPSY Left 06/14/2020   Procedure: EXTRACORPOREAL SHOCK WAVE LITHOTRIPSY (ESWL);  Surgeon: Sondra Come, MD;  Location: [facility] ORS;  Service: Urology;  Laterality: Left;   FOOT SURGERY     RIGHT     KNEE ARTHROSCOPY W/ MENISCAL REPAIR Left    LASIK     MAXIMUM ACCESS (MAS)POSTERIOR LUMBAR INTERBODY FUSION (PLIF) 1 LEVEL N/A 04/25/2016   Procedure: LUMBAR FOUR-FIVE  MAXIMUM ACCESS (MAS) POSTERIOR LUMBAR INTERBODY FUSION (PLIF) with extension of instrumentation LUMBAR TWO-FIVE;  Surgeon: Tia Alert, MD;  Location: [facility] OR;  Service: Neurosurgery;  Laterality: N/A;   MAXIMUM ACCESS (MAS)POSTERIOR LUMBAR INTERBODY FUSION (PLIF) 2 LEVEL N/A 12/13/2015   Procedure: Lumbar two-three - Lumbar three-four MAXIMUM ACCESS (MAS) POSTERIOR LUMBAR INTERBODY FUSION (PLIF)  ;  Surgeon: Tia Alert, MD;  Location: [facility] NEURO  ORS;  Service: Neurosurgery;  Laterality: N/A;   REVERSE SHOULDER ARTHROPLASTY Left 03/01/2020   Procedure: REVERSE SHOULDER ARTHROPLASTY;  Surgeon: Christena FlakePoggi, John J, MD;  Location: [facility] ORS;  Service: Orthopedics;  Laterality: Left;   SHOULDER SURGERY     RIGHT    TONSILLECTOMY      There were no vitals filed for this visit.   Subjective Assessment - 12/18/20 1724     Subjective Pt reports a rough day, had migrain issues earlier. She also report snow having first time Left sided sciatica symptoms similar to Rt side. HEP compliance was better this week.    Pertinent History Low back pain with R LE radiating symptoms. Pain in R posterior hip with radiating down along R L5 dermatome to ankle. Pain appeared since after [place] 2021. Got a steroid injection 08/2020 which did not  help. Sep 10, 2020, pt had 2 more steroid injections for her back which helped but pain returned July 2022. Went to the ER July 3 due to back pain. Pt was at the beach and fell while in waist deep water.  The day before, pt fell backwards and her back hit the sand when pt stepped onto a dip in the sand.  Stepped on her R shoe lace the night before leaving the beach and fell to her L side onto the floor on November 15, 2020 evening while packing up. Pain has worsend since her beach trip. Denies loss of bowel control, sees a doctor for her bladder. Denies saddle anesthesia.    Currently in Pain? Yes    Pain Score --   3/10 Right buttocks, 0/10 Left now resolved since this morning.             INTERVENTION THIS DATE: -seated pillow ADD squeeze 15x5secH -seated hip extension isometric 15x3secH (foot into pillow) -seated heel slides on slider c 5lb AW 1x15 bilat -seated LAQ 1x15 bilat, no resistance (cues to  -seated marching (1" lift) 1x15 bilat  -STS from chair, arms ad lib 1x5 -standing tandem heel rise 1x15  -standing side step 1x10 bilat  -standing RDL training lumbar stabilization magenta ball to step stool 1x10 (difficulty with consistent control of lumbar spine)  1x10 with stool +airex but lumbar control is worse  -seated cat/cow 1x20, difficulty isolating movement to Lspine only (consistent pain with extension, improved with flexion) -seated redTB clams 1x15      PT Education - 12/18/20 1725     Education Details activity modification for symptoms control    Person(s) Educated Patient    Methods Explanation    Comprehension Verbalized understanding;Need further instruction              PT Short Term Goals - 11/26/20 1316       PT SHORT TERM GOAL #1   Title Pt will be independent with her initial HEP to decrease pain, improve strength, function, and ability to perform transfers more comfortably and with less difficulty.    Time 3    Period Weeks    Status New    Target  Date 12/20/20               PT Long Term Goals - 11/26/20 1317       PT LONG TERM GOAL #1   Title Pt will have a decrease in low back pain to 6/10 or less at worst to promote ability to perform transfers, bed mobility as well as perform standing tasks more comfortably.    Baseline 20/10  low back pain at worst for the past 3 months (11/26/2020)    Time 8    Period Weeks    Status New    Target Date 01/24/21      PT LONG TERM GOAL #2   Title Patient will have a decrease in R LE pain to 6/10 or less at worst to promote ability to perform transfers, bed mobility as well as perform standing tasks more comfortably.    Baseline 20/10 R LE pain at worst to R foot for the past 3 months (11/26/2020)    Time 8    Period Weeks    Status New    Target Date 01/24/21      PT LONG TERM GOAL #3   Title Patient will improve her lumbar FOTO score by at least 10 points as a demonstration of improved function.    Baseline Lumbar FOTO: pt unable to complete (11/26/2020)    Time 8    Period Weeks    Status New    Target Date 01/24/21      PT LONG TERM GOAL #4   Title Pt will improve bilateral hip extension and abduction strength by at least 1/2 MMT grade to promote ability to stand, ambulate and perform transfers more comfortably.    Baseline Hip extension 4-/5 R and L, hip abduction 4/5 R and L (11/26/2020)    Time 8    Period Weeks    Status New    Target Date 01/24/21                   Plan - 12/18/20 1728     Clinical Impression Statement Continued with current plan of care as laid out in evaluation and recent prior sessions. Pt remains motivated to advance progress toward goals. Rest breaks provided as needed, pt quick to ask when needed. Author maintains all interventions within appropriate level of intensity as not to purposefully exacerbate pain. Pt does require varying levels of assistance and cuing for completion of exercises for correct form and sometimes due to  pain/weakness, especially with deadlift stabilization training, but she learns quickly. Pt continues to demonstrate progress toward goals AEB progression of some interventions this date either in volume or intensity or in transition to standing. No updates to HEP this date.    Personal Factors and Comorbidities Comorbidity 3+;Age;Fitness;Past/Current Experience;Time since onset of injury/illness/exacerbation    Comorbidities Anxiety, asthma, depression, DM, hx of kidney stones, obesity, RA    Examination-Activity Limitations Bed Mobility;Stand;Lift;Locomotion Level;Bend;Transfers;Carry    Stability/Clinical Decision Making Evolving/Moderate complexity    Clinical Decision Making Moderate    Rehab Potential Fair    PT Frequency 2x / week    PT Duration 8 weeks    PT Treatment/Interventions Therapeutic exercise;Balance training;Neuromuscular re-education;Therapeutic activities;Patient/family education;Manual techniques;Dry needling;Aquatic Therapy;Electrical Stimulation;Iontophoresis 4mg /ml Dexamethasone    PT Next Visit Plan Stay the course    PT Home Exercise Plan No changes on 8/16    Consulted and Agree with Plan of Care Patient             Patient will benefit from skilled therapeutic intervention in order to improve the following deficits and impairments:  Pain, Improper body mechanics, Postural dysfunction, Decreased strength, Decreased balance  Visit Diagnosis: Chronic right-sided low back pain, unspecified whether sciatica present  Radiculopathy, lumbosacral region     Problem List Patient Active Problem List   Diagnosis Date Noted   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 edema associated with  type 2 diabetes mellitus (HCC) 01/03/2019   Chronic cough 11/19/2016   Morbid obesity (HCC) 09/26/2016   Left ovarian cyst 05/30/2016   S/P lumbar spinal fusion 12/13/2015   Aortic calcification (HCC) 07/18/2015   Controlled type 2 diabetes mellitus  without complication (HCC) 05/16/2015   Thrombocytopenia (HCC) 05/16/2015   Recurrent major depressive disorder, in full remission (HCC) 05/16/2015   Essential (primary) hypertension 05/16/2015   Fatty infiltration of liver 03/15/2015   Aortic valve stenosis, nonrheumatic 01/18/2015   Sciatica of right side 01/09/2015   Type 2 diabetes mellitus (HCC) 11/10/2014   RLS (restless legs syndrome) 08/23/2014   Neuritis or radiculitis due to rupture of lumbar intervertebral disc 06/13/2014   Degeneration of intervertebral disc of lumbar region 06/13/2014   Lumbar radiculitis 06/13/2014   Arthritis 01/23/2014   Arthritis of knee, degenerative 11/15/2013   Depression 09/29/2013   Insomnia 09/29/2013   Apnea, sleep 08/29/2013   History of nephrolithiasis 08/29/2013   Abnormal presence of protein in urine 08/29/2013   Headache, migraine 08/29/2013   BP (high blood pressure) 08/29/2013   HLD (hyperlipidemia) 08/29/2013   Allergic rhinitis 08/29/2013   Intractable migraine without aura 02/18/2013   5:49 PM, 12/18/20 Rosamaria Lints, PT, DPT Physical Therapist - [place] [phone] (Office)   Sakiyah Shur C 12/18/2020, 5:36 PM  [facility] [address], [place], [address] Phone: [phone]   Fax:  [phone]  Name: Jessica Cole MRN: [id number] Date of Birth: 1950/12/17

## 2020-12-20 ENCOUNTER — Ambulatory Visit: Payer: Medicare HMO

## 2020-12-20 DIAGNOSIS — M5417 Radiculopathy, lumbosacral region: Secondary | ICD-10-CM

## 2020-12-20 DIAGNOSIS — M545 Low back pain, unspecified: Secondary | ICD-10-CM

## 2020-12-20 NOTE — Therapy (Signed)
[facility] [address] [place], [address] Phone: [phone]   Fax:  [phone]  Physical Therapy Treatment  Patient Details  Name: Jessica Cole MRN: [id number] Date of Birth: March 14, 1951 Referring Provider (PT): Burman Freestone, NP   Encounter Date: 12/20/2020   PT End of Session - 12/20/20 1547     Visit Number 8    Number of Visits 17    Date for PT Re-Evaluation 01/24/21    Authorization Type [employer] Medicare    Authorization Time Period 11/26/20-01/24/21    Progress Note Due on Visit 10    PT Start Time 1547    PT Stop Time 1630    PT Time Calculation (min) 43 min    Activity Tolerance Patient tolerated treatment well;No increased pain;Patient limited by pain    Behavior During Therapy [facility] for tasks assessed/performed             Past Medical History:  Diagnosis Date   Anxiety    Asthma    seasonal with allergies   Colon polyps    Degenerative arthritis    Depression    Diabetes mellitus without complication (HCC)    Type II   Environmental allergies    Family history of adverse reaction to anesthesia    sister- PONV   Fatty liver    Glaucoma    Headache(784.0)    HX  MIGRAINES   History of bronchitis    History of kidney stones    History of pneumonia    Hypertension    Hypothyroidism    Obesity    OSA on CPAP    Plantar fasciitis    right   PONV (postoperative nausea and vomiting)    Renal calculi    Renal calculi    Rheumatoid arthritis (HCC)    RLS (restless legs syndrome) 08/23/2014    Past Surgical History:  Procedure Laterality Date   ABDOMINAL HYSTERECTOMY     partial   APPENDECTOMY     Arthroscopic surgery, knee Left    BREAST BIOPSY Right    several   BREAST BIOPSY Right 03/11/2017   u/s bx neg   BUNIONECTOMY     LEFT    COLONOSCOPY W/ POLYPECTOMY     COLONOSCOPY WITH PROPOFOL N/A 08/14/2017   Procedure: COLONOSCOPY WITH PROPOFOL;  Surgeon: Christena Deem, MD;  Location: [facility] ENDOSCOPY;  Service: Endoscopy;  Laterality: N/A;   EXTRACORPOREAL SHOCK WAVE LITHOTRIPSY Left 05/14/2017   Procedure: EXTRACORPOREAL SHOCK WAVE LITHOTRIPSY (ESWL);  Surgeon: Riki Altes, MD;  Location: [facility] ORS;  Service: Urology;  Laterality: Left;   EXTRACORPOREAL SHOCK WAVE LITHOTRIPSY Left 06/14/2020   Procedure: EXTRACORPOREAL SHOCK WAVE LITHOTRIPSY (ESWL);  Surgeon: Sondra Come, MD;  Location: [facility] ORS;  Service: Urology;  Laterality: Left;   FOOT SURGERY     RIGHT     KNEE ARTHROSCOPY W/ MENISCAL REPAIR Left    LASIK     MAXIMUM ACCESS (MAS)POSTERIOR LUMBAR INTERBODY FUSION (PLIF) 1 LEVEL N/A 04/25/2016   Procedure: LUMBAR FOUR-FIVE  MAXIMUM ACCESS (MAS) POSTERIOR LUMBAR INTERBODY FUSION (PLIF) with extension of instrumentation LUMBAR TWO-FIVE;  Surgeon: Tia Alert, MD;  Location: [facility] OR;  Service: Neurosurgery;  Laterality: N/A;   MAXIMUM ACCESS (MAS)POSTERIOR LUMBAR INTERBODY FUSION (PLIF) 2 LEVEL N/A 12/13/2015   Procedure: Lumbar two-three - Lumbar three-four MAXIMUM ACCESS (MAS) POSTERIOR LUMBAR INTERBODY FUSION (PLIF)  ;  Surgeon: Tia Alert, MD;  Location: [facility] NEURO  ORS;  Service: Neurosurgery;  Laterality: N/A;   REVERSE SHOULDER ARTHROPLASTY Left 03/01/2020   Procedure: REVERSE SHOULDER ARTHROPLASTY;  Surgeon: Christena FlakePoggi, John J, MD;  Location: [facility] ORS;  Service: Orthopedics;  Laterality: Left;   SHOULDER SURGERY     RIGHT    TONSILLECTOMY      There were no vitals filed for this visit.   Subjective Assessment - 12/20/20 1549     Subjective Back is not good right now. 9/10 currently. R LE pain 9/10 currently. Was pretty good after last session but had to help her husband and lift stuff to him when he was working on the roof.    Pertinent History Low back pain with R LE radiating symptoms. Pain in R posterior hip with radiating down along R L5 dermatome to ankle. Pain appeared since after [place] 2021. Got a steroid injection 08/2020  which did not help. Sep 10, 2020, pt had 2 more steroid injections for her back which helped but pain returned July 2022. Went to the ER July 3 due to back pain. Pt was at the beach and fell while in waist deep water.  The day before, pt fell backwards and her back hit the sand when pt stepped onto a dip in the sand.  Stepped on her R shoe lace the night before leaving the beach and fell to her L side onto the floor on November 15, 2020 evening while packing up. Pain has worsend since her beach trip. Denies loss of bowel control, sees a doctor for her bladder. Denies saddle anesthesia.    Currently in Pain? Yes    Pain Score 9                                        PT Education - 12/20/20 1552     Education Details ther-ex    Person(s) Educated Patient    Methods Explanation;Demonstration;Tactile cues;Verbal cues    Comprehension Returned demonstration;Verbalized understanding           Objectives     Medbridge Access Code [email or web address]       Therapeutic exercise   Seated hip adduction folded pillow squeeze 10x5 seconds for 3 sets  Seated B ankle DF/PF 10x3              Seated leg extension              R 10x3             L 10x3   Seated hip ER/IR             R 10x3             L 10x3  Seated hip hinging 10x3  8/10 back pain after aforementioned exercises  Seated B scapular retraction 10x3  Seated B hand supination/pronation 10x3   Seated gentle chin tucks to promote upper thoracic extension and decrease stress to low back. 10x3  8/10 R low back and R LE, 2/10 L low back and L LE afterwards (improved L LE symptoms)    Then again:    Seated B ankle DF/PF 10x3              Seated leg extension              R 10x3             L 10x3   Seated hip  ER/IR             R 10x3             L 10x3   Seated hip adduction folded pillow squeeze 10x5 seconds  Then with PT manual resistance 10x2 with 5 second holds   Seated hip extension  isometrics   R 6x5 seconds   L 2x5 seconds R posterior thigh symptoms.     Seated transversus abdominis contraction 10x5 seconds        Improved exercise technique, movement at target joints, use of target muscles after mod verbal, visual, tactile cues.     Response to treatment Fair tolerance to today's session.       Clinical impression Worked on movement throughout body to promote neural flossing. Symptoms initially decreased after first round of 10x3. Symptoms however increased R LE during second round of 10x3. Decreased R posterior hip symptoms with decreasing R piriformis muscle tension via reciprocal inhibition. Decreased R LE and back symptoms to 6/10 and L low back and LE symptoms to 1.5/10 after session. Pt will benefit from continued skilled physical therapy services to decrease pain, improve strength and function.         PT Short Term Goals - 11/26/20 1316       PT SHORT TERM GOAL #1   Title Pt will be independent with her initial HEP to decrease pain, improve strength, function, and ability to perform transfers more comfortably and with less difficulty.    Time 3    Period Weeks    Status New    Target Date 12/20/20               PT Long Term Goals - 11/26/20 1317       PT LONG TERM GOAL #1   Title Pt will have a decrease in low back pain to 6/10 or less at worst to promote ability to perform transfers, bed mobility as well as perform standing tasks more comfortably.    Baseline 20/10 low back pain at worst for the past 3 months (11/26/2020)    Time 8    Period Weeks    Status New    Target Date 01/24/21      PT LONG TERM GOAL #2   Title Patient will have a decrease in R LE pain to 6/10 or less at worst to promote ability to perform transfers, bed mobility as well as perform standing tasks more comfortably.    Baseline 20/10 R LE pain at worst to R foot for the past 3 months (11/26/2020)    Time 8    Period Weeks    Status New    Target Date  01/24/21      PT LONG TERM GOAL #3   Title Patient will improve her lumbar FOTO score by at least 10 points as a demonstration of improved function.    Baseline Lumbar FOTO: pt unable to complete (11/26/2020)    Time 8    Period Weeks    Status New    Target Date 01/24/21      PT LONG TERM GOAL #4   Title Pt will improve bilateral hip extension and abduction strength by at least 1/2 MMT grade to promote ability to stand, ambulate and perform transfers more comfortably.    Baseline Hip extension 4-/5 R and L, hip abduction 4/5 R and L (11/26/2020)    Time 8    Period Weeks    Status New  Target Date 01/24/21                   Plan - 12/20/20 1552     Clinical Impression Statement Worked on movement throughout body to promote neural flossing. Symptoms initially decreased after first round of 10x3. Symptoms however increased R LE during second round of 10x3. Decreased R posterior hip symptoms with decreasing R piriformis muscle tension via reciprocal inhibition. Decreased R LE and back symptoms to 6/10 and L low back and LE symptoms to 1.5/10 after session. Pt will benefit from continued skilled physical therapy services to decrease pain, improve strength and function.    Personal Factors and Comorbidities Comorbidity 3+;Age;Fitness;Past/Current Experience;Time since onset of injury/illness/exacerbation    Comorbidities Anxiety, asthma, depression, DM, hx of kidney stones, obesity, RA    Examination-Activity Limitations Bed Mobility;Stand;Lift;Locomotion Level;Bend;Transfers;Carry    Stability/Clinical Decision Making Evolving/Moderate complexity    Rehab Potential Fair    PT Frequency 2x / week    PT Duration 8 weeks    PT Treatment/Interventions Therapeutic exercise;Balance training;Neuromuscular re-education;Therapeutic activities;Patient/family education;Manual techniques;Dry needling;Aquatic Therapy;Electrical Stimulation;Iontophoresis 4mg /ml Dexamethasone    PT Next Visit  Plan Posture, trunk,m hip strengthening, manual techniques, modalities PRN    Consulted and Agree with Plan of Care Patient             Patient will benefit from skilled therapeutic intervention in order to improve the following deficits and impairments:  Pain, Improper body mechanics, Postural dysfunction, Decreased strength, Decreased balance  Visit Diagnosis: Chronic right-sided low back pain, unspecified whether sciatica present  Radiculopathy, lumbosacral region     Problem List Patient Active Problem List   Diagnosis Date Noted   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) 01/03/2019   Chronic cough 11/19/2016   Morbid obesity (HCC) 09/26/2016   Left ovarian cyst 05/30/2016   S/P lumbar spinal fusion 12/13/2015   Aortic calcification (HCC) 07/18/2015   Controlled type 2 diabetes mellitus without complication (HCC) 05/16/2015   Thrombocytopenia (HCC) 05/16/2015   Recurrent major depressive disorder, in full remission (HCC) 05/16/2015   Essential (primary) hypertension 05/16/2015   Fatty infiltration of liver 03/15/2015   Aortic valve stenosis, nonrheumatic 01/18/2015   Sciatica of right side 01/09/2015   Type 2 diabetes mellitus (HCC) 11/10/2014   RLS (restless legs syndrome) 08/23/2014   Neuritis or radiculitis due to rupture of lumbar intervertebral disc 06/13/2014   Degeneration of intervertebral disc of lumbar region 06/13/2014   Lumbar radiculitis 06/13/2014   Arthritis 01/23/2014   Arthritis of knee, degenerative 11/15/2013   Depression 09/29/2013   Insomnia 09/29/2013   Apnea, sleep 08/29/2013   History of nephrolithiasis 08/29/2013   Abnormal presence of protein in urine 08/29/2013   Headache, migraine 08/29/2013   BP (high blood pressure) 08/29/2013   HLD (hyperlipidemia) 08/29/2013   Allergic rhinitis 08/29/2013   Intractable migraine without aura 02/18/2013    02/20/2013 PT, DPT   12/20/2020, 5:29 PM  [facility] [address], [place] Phone: [phone]   Fax:  [phone]  Name: MAEVE DEBORD MRN: [name] Date of Birth: 15-Mar-1951

## 2020-12-25 ENCOUNTER — Ambulatory Visit: Payer: Medicare HMO

## 2020-12-25 DIAGNOSIS — M545 Low back pain, unspecified: Secondary | ICD-10-CM | POA: Diagnosis not present

## 2020-12-25 DIAGNOSIS — M5417 Radiculopathy, lumbosacral region: Secondary | ICD-10-CM

## 2020-12-25 NOTE — Therapy (Signed)
[facility] [address], [place], [address] Phone: [phone]   Fax:  [phone]  Physical Therapy Treatment  Patient Details  Name: Jessica Cole MRN: [id number] Date of Birth: 1950-11-01 Referring Provider (PT): Burman Freestone, NP   Encounter Date: 12/25/2020   PT End of Session - 12/25/20 1507     Visit Number 9    Number of Visits 17    Date for PT Re-Evaluation 01/24/21    Authorization Type [employer]na Medicare    Authorization Time Period 11/26/20-01/24/21    Progress Note Due on Visit 10    PT Start Time 1501    PT Stop Time 1544    PT Time Calculation (min) 43 min    Activity Tolerance Patient tolerated treatment well;No increased pain    Behavior During Therapy WFL for tasks assessed/performed             Past Medical History:  Diagnosis Date   Anxiety    Asthma    seasonal with allergies   Colon polyps    Degenerative arthritis    Depression    Diabetes mellitus without complication (HCC)    Type II   Environmental allergies    Family history of adverse reaction to anesthesia    sister- PONV   Fatty liver    Glaucoma    Headache(784.0)    HX  MIGRAINES   History of bronchitis    History of kidney stones    History of pneumonia    Hypertension    Hypothyroidism    Obesity    OSA on CPAP    Plantar fasciitis    right   PONV (postoperative nausea and vomiting)    Renal calculi    Renal calculi    Rheumatoid arthritis (HCC)    RLS (restless legs syndrome) 08/23/2014    Past Surgical History:  Procedure Laterality Date   ABDOMINAL HYSTERECTOMY     partial   APPENDECTOMY     Arthroscopic surgery, knee Left    BREAST BIOPSY Right    several   BREAST BIOPSY Right 03/11/2017   u/s bx neg   BUNIONECTOMY     LEFT    COLONOSCOPY W/ POLYPECTOMY     COLONOSCOPY WITH PROPOFOL N/A 08/14/2017   Procedure: COLONOSCOPY WITH PROPOFOL;  Surgeon: Christena Deem, MD;  Location:  [facility] ENDOSCOPY;  Service: Endoscopy;  Laterality: N/A;   EXTRACORPOREAL SHOCK WAVE LITHOTRIPSY Left 05/14/2017   Procedure: EXTRACORPOREAL SHOCK WAVE LITHOTRIPSY (ESWL);  Surgeon: Riki Altes, MD;  Location: [facility] ORS;  Service: Urology;  Laterality: Left;   EXTRACORPOREAL SHOCK WAVE LITHOTRIPSY Left 06/14/2020   Procedure: EXTRACORPOREAL SHOCK WAVE LITHOTRIPSY (ESWL);  Surgeon: Sondra Come, MD;  Location: [facility] ORS;  Service: Urology;  Laterality: Left;   FOOT SURGERY     RIGHT     KNEE ARTHROSCOPY W/ MENISCAL REPAIR Left    LASIK     MAXIMUM ACCESS (MAS)POSTERIOR LUMBAR INTERBODY FUSION (PLIF) 1 LEVEL N/A 04/25/2016   Procedure: LUMBAR FOUR-FIVE  MAXIMUM ACCESS (MAS) POSTERIOR LUMBAR INTERBODY FUSION (PLIF) with extension of instrumentation LUMBAR TWO-FIVE;  Surgeon: Tia Alert, MD;  Location: [facility] OR;  Service: Neurosurgery;  Laterality: N/A;   MAXIMUM ACCESS (MAS)POSTERIOR LUMBAR INTERBODY FUSION (PLIF) 2 LEVEL N/A 12/13/2015   Procedure: Lumbar two-three - Lumbar three-four MAXIMUM ACCESS (MAS) POSTERIOR LUMBAR INTERBODY FUSION (PLIF)  ;  Surgeon: Tia Alert, MD;  Location: [facility] NEURO ORS;  Service:  Neurosurgery;  Laterality: N/A;   REVERSE SHOULDER ARTHROPLASTY Left 03/01/2020   Procedure: REVERSE SHOULDER ARTHROPLASTY;  Surgeon: Christena Flake, MD;  Location: [facility] ORS;  Service: Orthopedics;  Laterality: Left;   SHOULDER SURGERY     RIGHT    TONSILLECTOMY      There were no vitals filed for this visit.   Subjective Assessment - 12/25/20 1504     Subjective Pt reports having "a good day" but still is unable to decipher a pattern of her pain. This past weekend was not good for her pain. Recorded at a 8-9/10 NPS.    Pertinent History Low back pain with R LE radiating symptoms. Pain in R posterior hip with radiating down along R L5 dermatome to ankle. Pain appeared since after [place] 2021. Got a steroid injection 08/2020 which did not help. Sep 10, 2020, pt had 2 more steroid  injections for her back which helped but pain returned July 2022. Went to the ER July 3 due to back pain. Pt was at the beach and fell while in waist deep water.  The day before, pt fell backwards and her back hit the sand when pt stepped onto a dip in the sand.  Stepped on her R shoe lace the night before leaving the beach and fell to her L side onto the floor on November 15, 2020 evening while packing up. Pain has worsend since her beach trip. Denies loss of bowel control, sees a doctor for her bladder. Denies saddle anesthesia.    Currently in Pain? No/denies              There.ex:   Seated 3x15 sciatic nerve flossing on RLE. Education provided on use during LBP and radicular symptoms are exacerbated. VC's coordinating head with LE motions to promote flossing of nerve   Seated hip adduction ball squeeze: 2x10, 5 sec hold.   Seated hip adduction ball squeeze with contralateral knee extension   Standing 4 way hip: 2# Aw's. Initial PT demo for form/technique. Good form after cuing.   Standing hamstring curls with 2# AW's, 1x12/LE   Pt reports mild R hamstring discomfort post hamstring curl . Attempts x5 Sciatic nerve floss to differentiate if hamstring discomfort or radicular pain. No change with flossing.   2x30 sec hamstring stretch on R in sitting. Improved post stretch   Seated hip ER with YTB: 2x10, 2-3 sec holds     Seated heel to toe raises: 1x10 bilat    Standing heel to toe raises: 2x8-10 reps   R FABER 4 stretch for piriformis stretch: 2x30 sec        PT Education - 12/25/20 1506     Education Details form/technique with exercise.    Person(s) Educated Patient    Methods Explanation;Demonstration;Tactile cues;Verbal cues    Comprehension Verbalized understanding;Returned demonstration              PT Short Term Goals - 11/26/20 1316       PT SHORT TERM GOAL #1   Title Pt will be independent with her initial HEP to decrease pain, improve strength, function, and  ability to perform transfers more comfortably and with less difficulty.    Time 3    Period Weeks    Status New    Target Date 12/20/20               PT Long Term Goals - 11/26/20 1317       PT LONG TERM GOAL #1  Title Pt will have a decrease in low back pain to 6/10 or less at worst to promote ability to perform transfers, bed mobility as well as perform standing tasks more comfortably.    Baseline 20/10 low back pain at worst for the past 3 months (11/26/2020)    Time 8    Period Weeks    Status New    Target Date 01/24/21      PT LONG TERM GOAL #2   Title Patient will have a decrease in R LE pain to 6/10 or less at worst to promote ability to perform transfers, bed mobility as well as perform standing tasks more comfortably.    Baseline 20/10 R LE pain at worst to R foot for the past 3 months (11/26/2020)    Time 8    Period Weeks    Status New    Target Date 01/24/21      PT LONG TERM GOAL #3   Title Patient will improve her lumbar FOTO score by at least 10 points as a demonstration of improved function.    Baseline Lumbar FOTO: pt unable to complete (11/26/2020)    Time 8    Period Weeks    Status New    Target Date 01/24/21      PT LONG TERM GOAL #4   Title Pt will improve bilateral hip extension and abduction strength by at least 1/2 MMT grade to promote ability to stand, ambulate and perform transfers more comfortably.    Baseline Hip extension 4-/5 R and L, hip abduction 4/5 R and L (11/26/2020)    Time 8    Period Weeks    Status New    Target Date 01/24/21                   Plan - 12/25/20 1637     Clinical Impression Statement Following primary PT POC. Denying LBP today and denies radiuclar pain. Progresed LE therex with resistnace to tolerance due to pt dening pain today. Monitored throughout session. Pt educated further on neural flossing and piriformis figure four stretching. Pt will continue to benefit from skilled PT services to further  decrease pain and improve strength and function.    Personal Factors and Comorbidities Comorbidity 3+;Age;Fitness;Past/Current Experience;Time since onset of injury/illness/exacerbation    Comorbidities Anxiety, asthma, depression, DM, hx of kidney stones, obesity, RA    Examination-Activity Limitations Bed Mobility;Stand;Lift;Locomotion Level;Bend;Transfers;Carry    Stability/Clinical Decision Making Evolving/Moderate complexity    Rehab Potential Fair    PT Frequency 2x / week    PT Duration 8 weeks    PT Treatment/Interventions Therapeutic exercise;Balance training;Neuromuscular re-education;Therapeutic activities;Patient/family education;Manual techniques;Dry needling;Aquatic Therapy;Electrical Stimulation;Iontophoresis 4mg /ml Dexamethasone    PT Next Visit Plan Posture, trunk,m hip strengthening, manual techniques, modalities PRN    Consulted and Agree with Plan of Care Patient             Patient will benefit from skilled therapeutic intervention in order to improve the following deficits and impairments:  Pain, Improper body mechanics, Postural dysfunction, Decreased strength, Decreased balance  Visit Diagnosis: Chronic right-sided low back pain, unspecified whether sciatica present  Radiculopathy, lumbosacral region     Problem List Patient Active Problem List   Diagnosis Date Noted   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) 01/03/2019   Chronic cough 11/19/2016   Morbid obesity (HCC) 09/26/2016   Left ovarian cyst 05/30/2016   S/P lumbar spinal fusion  12/13/2015   Aortic calcification (HCC) 07/18/2015   Controlled type 2 diabetes mellitus without complication (HCC) 05/16/2015   Thrombocytopenia (HCC) 05/16/2015   Recurrent major depressive disorder, in full remission (HCC) 05/16/2015   Essential (primary) hypertension 05/16/2015   Fatty infiltration of liver 03/15/2015   Aortic valve  stenosis, nonrheumatic 01/18/2015   Sciatica of right side 01/09/2015   Type 2 diabetes mellitus (HCC) 11/10/2014   RLS (restless legs syndrome) 08/23/2014   Neuritis or radiculitis due to rupture of lumbar intervertebral disc 06/13/2014   Degeneration of intervertebral disc of lumbar region 06/13/2014   Lumbar radiculitis 06/13/2014   Arthritis 01/23/2014   Arthritis of knee, degenerative 11/15/2013   Depression 09/29/2013   Insomnia 09/29/2013   Apnea, sleep 08/29/2013   History of nephrolithiasis 08/29/2013   Abnormal presence of protein in urine 08/29/2013   Headache, migraine 08/29/2013   BP (high blood pressure) 08/29/2013   HLD (hyperlipidemia) 08/29/2013   Allergic rhinitis 08/29/2013   Intractable migraine without aura 02/18/2013    Delphia GratesMilton M. Fairly IV, PT, DPT Physical Therapist- [place]  [facility]  12/25/2020, 4:43 PM  [facility] [address] , [place], [address] Phone: [phone]   Fax:  [phone]  Name: Nobie PutnamGail T Poynor MRN: [id number] Date of Birth: 21-Apr-1951

## 2020-12-27 ENCOUNTER — Other Ambulatory Visit: Payer: Self-pay

## 2020-12-27 ENCOUNTER — Ambulatory Visit: Payer: Medicare HMO

## 2020-12-27 DIAGNOSIS — M545 Low back pain, unspecified: Secondary | ICD-10-CM | POA: Diagnosis not present

## 2020-12-27 DIAGNOSIS — M6281 Muscle weakness (generalized): Secondary | ICD-10-CM

## 2020-12-27 DIAGNOSIS — M5417 Radiculopathy, lumbosacral region: Secondary | ICD-10-CM

## 2020-12-27 NOTE — Therapy (Signed)
[place] [facility] [address], [place], [address] Phone: [phone]   Fax:  [phone]  Physical Therapy Treatment/Physical Therapy Progress Note   Dates of reporting period  11/26/2020   to   12/27/2020  Patient Details  Name: Jessica Cole MRN: [id number] Date of Birth: [birth year] Referring Provider (PT): Burman Freestone, NP   Encounter Date: 12/27/2020   PT End of Session - 12/27/20 1505     Visit Number 10    Number of Visits 17    Date for PT Re-Evaluation 01/24/21    Authorization Type [employer]    Authorization Time Period 11/26/20-01/24/21    Progress Note Due on Visit 10    PT Start Time 1459    PT Stop Time 1543    PT Time Calculation (min) 44 min    Activity Tolerance Patient tolerated treatment well;No increased pain    Behavior During Therapy WFL for tasks assessed/performed             Past Medical History:  Diagnosis Date   Anxiety    Asthma    seasonal with allergies   Colon polyps    Degenerative arthritis    Depression    Diabetes mellitus without complication (HCC)    Type II   Environmental allergies    Family history of adverse reaction to anesthesia    sister- PONV   Fatty liver    Glaucoma    Headache(784.0)    HX  MIGRAINES   History of bronchitis    History of kidney stones    History of pneumonia    Hypertension    Hypothyroidism    Obesity    OSA on CPAP    Plantar fasciitis    right   PONV (postoperative nausea and vomiting)    Renal calculi    Renal calculi    Rheumatoid arthritis (HCC)    RLS (restless legs syndrome) 08/23/2014    Past Surgical History:  Procedure Laterality Date   ABDOMINAL HYSTERECTOMY     partial   APPENDECTOMY     Arthroscopic surgery, knee Left    BREAST BIOPSY Right    several   BREAST BIOPSY Right 03/11/2017   u/s bx neg   BUNIONECTOMY     LEFT    COLONOSCOPY W/ POLYPECTOMY     COLONOSCOPY WITH PROPOFOL N/A  08/14/2017   Procedure: COLONOSCOPY WITH PROPOFOL;  Surgeon: Christena Deem, MD;  Location: [facility] ENDOSCOPY;  Service: Endoscopy;  Laterality: N/A;   EXTRACORPOREAL SHOCK WAVE LITHOTRIPSY Left 05/14/2017   Procedure: EXTRACORPOREAL SHOCK WAVE LITHOTRIPSY (ESWL);  Surgeon: Riki Altes, MD;  Location: [facility] ORS;  Service: Urology;  Laterality: Left;   EXTRACORPOREAL SHOCK WAVE LITHOTRIPSY Left 06/14/2020   Procedure: EXTRACORPOREAL SHOCK WAVE LITHOTRIPSY (ESWL);  Surgeon: Sondra Come, MD;  Location: [facility] ORS;  Service: Urology;  Laterality: Left;   FOOT SURGERY     RIGHT     KNEE ARTHROSCOPY W/ MENISCAL REPAIR Left    LASIK     MAXIMUM ACCESS (MAS)POSTERIOR LUMBAR INTERBODY FUSION (PLIF) 1 LEVEL N/A 04/25/2016   Procedure: LUMBAR FOUR-FIVE  MAXIMUM ACCESS (MAS) POSTERIOR LUMBAR INTERBODY FUSION (PLIF) with extension of instrumentation LUMBAR TWO-FIVE;  Surgeon: Tia Alert, MD;  Location: [facility] OR;  Service: Neurosurgery;  Laterality: N/A;   MAXIMUM ACCESS (MAS)POSTERIOR LUMBAR INTERBODY FUSION (PLIF) 2 LEVEL N/A 12/13/2015   Procedure: Lumbar two-three - Lumbar three-four MAXIMUM ACCESS (MAS) POSTERIOR LUMBAR INTERBODY  FUSION (PLIF)  ;  Surgeon: Tia Alert, MD;  Location: [facility] NEURO ORS;  Service: Neurosurgery;  Laterality: N/A;   REVERSE SHOULDER ARTHROPLASTY Left 03/01/2020   Procedure: REVERSE SHOULDER ARTHROPLASTY;  Surgeon: Christena Flake, MD;  Location: [facility] ORS;  Service: Orthopedics;  Laterality: Left;   SHOULDER SURGERY     RIGHT    TONSILLECTOMY      There were no vitals filed for this visit.   Subjective Assessment - 12/27/20 1503     Subjective Pt reports soreness from previous session and water aerobics. Radicular N/T remains similar to previous session.    Pertinent History Low back pain with R LE radiating symptoms. Pain in R posterior hip with radiating down along R L5 dermatome to ankle. Pain appeared since after [place] 2021. Got a steroid injection 08/2020 which  did not help. Sep 10, 2020, pt had 2 more steroid injections for her back which helped but pain returned July 2022. Went to the ER July 3 due to back pain. Pt was at the beach and fell while in waist deep water.  The day before, pt fell backwards and her back hit the sand when pt stepped onto a dip in the sand.  Stepped on her R shoe lace the night before leaving the beach and fell to her L side onto the floor on November 15, 2020 evening while packing up. Pain has worsend since her beach trip. Denies loss of bowel control, sees a doctor for her bladder. Denies saddle anesthesia.    Currently in Pain? No/denies    Pain Score 0-No pain              There.ex:    Progress note and quick assessment of long term goals. See below.  Complete FOTO: 40/49   Hip extension strength R/L: 3+/4  Hip abduction strength R/L: 3+/4+  Pt still reporting worst pain up to 15/10 pain in low back and RLE since beginning PT. Used to be a 20/10.   Seated hip adduction ball squeeze: 2x10, 5 sec hold.  Seated hip adduction ball squeeze with contralateral knee extension: 2x10  Seated hip ER with GTB: 2x10   Seated lumbar trunk roll outs on silver exercise ball for joint mobility:   Forward x10  R/L lat deviation x10  Seated Alternating hip flexion, LAQ's and heel/toe rises 1x10 to reduce LE muscle soreness  STS from standard height chair: 2x8  Standing side steps for lat hip strengthening: x4 inside room. Two bouts with seated rest b/t bouts.    Patient's condition has the potential to improve in response to therapy. Maximum improvement is yet to be obtained. The anticipated improvement is attainable and reasonable in a generally predictable time.   PT Education - 12/27/20 1505     Education Details form/technique with exercise    Person(s) Educated Patient    Methods Explanation;Verbal cues;Tactile cues;Demonstration    Comprehension Verbalized understanding;Returned demonstration              PT  Short Term Goals - 11/26/20 1316       PT SHORT TERM GOAL #1   Title Pt will be independent with her initial HEP to decrease pain, improve strength, function, and ability to perform transfers more comfortably and with less difficulty.    Time 3    Period Weeks    Status New    Target Date 12/20/20               PT  Long Term Goals - 12/27/20 1516       PT LONG TERM GOAL #1   Title Pt will have a decrease in low back pain to 6/10 or less at worst to promote ability to perform transfers, bed mobility as well as perform standing tasks more comfortably.    Baseline 20/10 low back pain at worst for the past 3 months (11/26/2020)    Time 8    Period Weeks    Status New    Target Date 01/24/21      PT LONG TERM GOAL #2   Title Patient will have a decrease in R LE pain to 6/10 or less at worst to promote ability to perform transfers, bed mobility as well as perform standing tasks more comfortably.    Baseline 20/10 R LE pain at worst to R foot for the past 3 months (11/26/2020)    Time 8    Period Weeks    Status New    Target Date 01/24/21      PT LONG TERM GOAL #3   Title Patient will improve her lumbar FOTO score by at least 10 points as a demonstration of improved function.    Baseline Lumbar FOTO: pt unable to complete (11/26/2020); 8/25: 40/49    Time 8    Period Weeks    Status New    Target Date 01/24/21      PT LONG TERM GOAL #4   Title Pt will improve bilateral hip extension and abduction strength by at least 1/2 MMT grade to promote ability to stand, ambulate and perform transfers more comfortably.    Baseline Hip extension 4-/5 R and L, hip abduction 4/5 R and L (11/26/2020)    Time 8    Period Weeks    Status New    Target Date 01/24/21                   Plan - 12/27/20 1548     Clinical Impression Statement Pt progressing towards goals with reduction in LBP and RLE radicualr pain however does remain significantly elevated. Able to have pt complete  foto survey scoring a 40/49. Pt demo's hip extension and abd weakness on R > L. MMT inconclusive with interrater reliability on numbered score form primary PT to covering PT. Will need to be reassessed by primary PT. Overall pt remains sore from LE exercises with no increase in pain. Although progressing with less pain, pt still demonstrates elevated pain and LE weakness. Pt will continue to benefit from skilled PT services to address remaining deficits to improve function.    Personal Factors and Comorbidities Comorbidity 3+;Age;Fitness;Past/Current Experience;Time since onset of injury/illness/exacerbation    Comorbidities Anxiety, asthma, depression, DM, hx of kidney stones, obesity, RA    Examination-Activity Limitations Bed Mobility;Stand;Lift;Locomotion Level;Bend;Transfers;Carry    Stability/Clinical Decision Making Evolving/Moderate complexity    Rehab Potential Fair    PT Frequency 2x / week    PT Duration 8 weeks    PT Treatment/Interventions Therapeutic exercise;Balance training;Neuromuscular re-education;Therapeutic activities;Patient/family education;Manual techniques;Dry needling;Aquatic Therapy;Electrical Stimulation;Iontophoresis 4mg /ml Dexamethasone    PT Next Visit Plan Posture, trunk,m hip strengthening, manual techniques, modalities PRN    Consulted and Agree with Plan of Care Patient             Patient will benefit from skilled therapeutic intervention in order to improve the following deficits and impairments:  Pain, Improper body mechanics, Postural dysfunction, Decreased strength, Decreased balance  Visit Diagnosis: Chronic right-sided low back pain,  unspecified whether sciatica present  Radiculopathy, lumbosacral region  Muscle weakness (generalized)     Problem List Patient Active Problem List   Diagnosis Date Noted   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) 01/03/2019    Chronic cough 11/19/2016   Morbid obesity (HCC) 09/26/2016   Left ovarian cyst 05/30/2016   S/P lumbar spinal fusion 12/13/2015   Aortic calcification (HCC) 07/18/2015   Controlled type 2 diabetes mellitus without complication (HCC) 05/16/2015   Thrombocytopenia (HCC) 05/16/2015   Recurrent major depressive disorder, in full remission (HCC) 05/16/2015   Essential (primary) hypertension 05/16/2015   Fatty infiltration of liver 03/15/2015   Aortic valve stenosis, nonrheumatic 01/18/2015   Sciatica of right side 01/09/2015   Type 2 diabetes mellitus (HCC) 11/10/2014   RLS (restless legs syndrome) 08/23/2014   Neuritis or radiculitis due to rupture of lumbar intervertebral disc 06/13/2014   Degeneration of intervertebral disc of lumbar region 06/13/2014   Lumbar radiculitis 06/13/2014   Arthritis 01/23/2014   Arthritis of knee, degenerative 11/15/2013   Depression 09/29/2013   Insomnia 09/29/2013   Apnea, sleep 08/29/2013   History of nephrolithiasis 08/29/2013   Abnormal presence of protein in urine 08/29/2013   Headache, migraine 08/29/2013   BP (high blood pressure) 08/29/2013   HLD (hyperlipidemia) 08/29/2013   Allergic rhinitis 08/29/2013   Intractable migraine without aura 02/18/2013    Delphia Grates. Fairly IV, PT, DPT Physical Therapist- [place]  [facility]   12/27/2020, 3:57 PM  [place] [facility] SPORTS MEDICINE [address] [place], [address] Phone: [phone]   Fax:  [phone]  Name: Jessica Cole MRN: [id number] Date of Birth: Jul 25, 1950

## 2021-01-01 ENCOUNTER — Ambulatory Visit: Payer: Medicare HMO

## 2021-01-01 DIAGNOSIS — M6281 Muscle weakness (generalized): Secondary | ICD-10-CM

## 2021-01-01 DIAGNOSIS — M545 Low back pain, unspecified: Secondary | ICD-10-CM

## 2021-01-01 DIAGNOSIS — M5417 Radiculopathy, lumbosacral region: Secondary | ICD-10-CM

## 2021-01-01 DIAGNOSIS — G8929 Other chronic pain: Secondary | ICD-10-CM

## 2021-01-01 NOTE — Therapy (Signed)
[place] [facility] [address], [place], [address] Phone: [phone]   Fax:  [phone]  Physical Therapy Treatment  Patient Details  Name: Jessica Cole MRN: [id number] Date of Birth: Jul 11, 1950 Referring Provider (PT): Burman Freestone, NP   Encounter Date: 01/01/2021   PT End of Session - 01/01/21 1535     Visit Number 11    Number of Visits 17    Date for PT Re-Evaluation 01/24/21    Authorization Type [employer] Medicare    Authorization Time Period 11/26/20-01/24/21    Progress Note Due on Visit 20    PT Start Time 1500    PT Stop Time 1540    PT Time Calculation (min) 40 min    Activity Tolerance Patient tolerated treatment well;No increased pain    Behavior During Therapy WFL for tasks assessed/performed             Past Medical History:  Diagnosis Date   Anxiety    Asthma    seasonal with allergies   Colon polyps    Degenerative arthritis    Depression    Diabetes mellitus without complication (HCC)    Type II   Environmental allergies    Family history of adverse reaction to anesthesia    sister- PONV   Fatty liver    Glaucoma    Headache(784.0)    HX  MIGRAINES   History of bronchitis    History of kidney stones    History of pneumonia    Hypertension    Hypothyroidism    Obesity    OSA on CPAP    Plantar fasciitis    right   PONV (postoperative nausea and vomiting)    Renal calculi    Renal calculi    Rheumatoid arthritis (HCC)    RLS (restless legs syndrome) 08/23/2014    Past Surgical History:  Procedure Laterality Date   ABDOMINAL HYSTERECTOMY     partial   APPENDECTOMY     Arthroscopic surgery, knee Left    BREAST BIOPSY Right    several   BREAST BIOPSY Right 03/11/2017   u/s bx neg   BUNIONECTOMY     LEFT    COLONOSCOPY W/ POLYPECTOMY     COLONOSCOPY WITH PROPOFOL N/A 08/14/2017   Procedure: COLONOSCOPY WITH PROPOFOL;  Surgeon: Christena Deem, MD;  Location:  [facility] ENDOSCOPY;  Service: Endoscopy;  Laterality: N/A;   EXTRACORPOREAL SHOCK WAVE LITHOTRIPSY Left 05/14/2017   Procedure: EXTRACORPOREAL SHOCK WAVE LITHOTRIPSY (ESWL);  Surgeon: Riki Altes, MD;  Location: [facility] ORS;  Service: Urology;  Laterality: Left;   EXTRACORPOREAL SHOCK WAVE LITHOTRIPSY Left 06/14/2020   Procedure: EXTRACORPOREAL SHOCK WAVE LITHOTRIPSY (ESWL);  Surgeon: Sondra Come, MD;  Location: [facility] ORS;  Service: Urology;  Laterality: Left;   FOOT SURGERY     RIGHT     KNEE ARTHROSCOPY W/ MENISCAL REPAIR Left    LASIK     MAXIMUM ACCESS (MAS)POSTERIOR LUMBAR INTERBODY FUSION (PLIF) 1 LEVEL N/A 04/25/2016   Procedure: LUMBAR FOUR-FIVE  MAXIMUM ACCESS (MAS) POSTERIOR LUMBAR INTERBODY FUSION (PLIF) with extension of instrumentation LUMBAR TWO-FIVE;  Surgeon: Tia Alert, MD;  Location: [facility] OR;  Service: Neurosurgery;  Laterality: N/A;   MAXIMUM ACCESS (MAS)POSTERIOR LUMBAR INTERBODY FUSION (PLIF) 2 LEVEL N/A 12/13/2015   Procedure: Lumbar two-three - Lumbar three-four MAXIMUM ACCESS (MAS) POSTERIOR LUMBAR INTERBODY FUSION (PLIF)  ;  Surgeon: Tia Alert, MD;  Location: [facility] NEURO ORS;  Service:  Neurosurgery;  Laterality: N/A;   REVERSE SHOULDER ARTHROPLASTY Left 03/01/2020   Procedure: REVERSE SHOULDER ARTHROPLASTY;  Surgeon: Christena FlakePoggi, John J, MD;  Location: [facility] ORS;  Service: Orthopedics;  Laterality: Left;   SHOULDER SURGERY     RIGHT    TONSILLECTOMY      There were no vitals filed for this visit.   Subjective Assessment - 01/01/21 1508     Subjective Pt reports she is planning on seeing her back surgeon as she is not pleased with her progress with leg pain so far. Pt has been busy helping her husband adn not been able to find time for HEP.    Pertinent History Low back pain with RLE radiating symptoms. Pain in R posterior hip with radiating down along R L5 dermatome to ankle. Pain appeared since after [place] 2021. Got a steroid injection 08/2020 which did not help. Sep 10, 2020, pt had 2 more steroid injections for her back which helped but pain returned July 2022. Went to the ER July 3 due to back pain. Pt was at the beach and fell while in waist deep water.  The day before, pt fell backwards and her back hit the sand when pt stepped onto a dip in the sand.  Stepped on her R shoe lace the night before leaving the beach and fell to her L side onto the floor on November 15, 2020 evening while packing up. Pain has worsend since her beach trip. Denies loss of bowel control, sees a doctor for her bladder. Denies saddle anesthesia.    Patient Stated Goals Decrease pain.    Currently in Pain? Yes    Pain Score 6    Bilat glute max area with referral into Rt posterior thigh               [facility] PT Assessment - 01/01/21 0001       PROM   PROM Assessment Site Hip    Right/Left Hip Right;Left    Right Hip Flexion 114    increased left lumbar pain   Right Hip External Rotation  60    Right Hip Internal Rotation  24    Right Hip ABduction 23    Rt buttocks pain at end-range   Left Hip Flexion 105    Rt low back pain   Left Hip External Rotation  55    Left Hip Internal Rotation  23    Left Hip ABduction 24    Rt calf pain, worse with P/SLR and ankle DF             Intervention:  -Long traction of RLE  1x60sec, exacerbates Rt sciatc symptoms, CL back pain. Same response to LLE long axis traction.  -education on log roll return to sitting, requirs modA, lacks strength to perform independently.            PT Short Term Goals - 11/26/20 1316       PT SHORT TERM GOAL #1   Title Pt will be independent with her initial HEP to decrease pain, improve strength, function, and ability to perform transfers more comfortably and with less difficulty.    Time 3    Period Weeks    Status New    Target Date 12/20/20               PT Long Term Goals - 12/27/20 1516       PT LONG TERM GOAL #1   Title Pt will have a  decrease in low back pain to 6/10 or  less at worst to promote ability to perform transfers, bed mobility as well as perform standing tasks more comfortably.    Baseline 20/10 low back pain at worst for the past 3 months (11/26/2020)    Time 8    Period Weeks    Status New    Target Date 01/24/21      PT LONG TERM GOAL #2   Title Patient will have a decrease in R LE pain to 6/10 or less at worst to promote ability to perform transfers, bed mobility as well as perform standing tasks more comfortably.    Baseline 20/10 R LE pain at worst to R foot for the past 3 months (11/26/2020)    Time 8    Period Weeks    Status New    Target Date 01/24/21      PT LONG TERM GOAL #3   Title Patient will improve her lumbar FOTO score by at least 10 points as a demonstration of improved function.    Baseline Lumbar FOTO: pt unable to complete (11/26/2020); 8/25: 40/49    Time 8    Period Weeks    Status New    Target Date 01/24/21      PT LONG TERM GOAL #4   Title Pt will improve bilateral hip extension and abduction strength by at least 1/2 MMT grade to promote ability to stand, ambulate and perform transfers more comfortably.    Baseline Hip extension 4-/5 R and L, hip abduction 4/5 R and L (11/26/2020)    Time 8    Period Weeks    Status New    Target Date 01/24/21                   Plan - 01/01/21 1536     Clinical Impression Statement Patient presenting after recent session and reassessment.  Patient continues to have fluctuating pain minimal improvement in severity and aggravating/alleviating factors during the day remains difficult to determine.  Does state took time to assess bilateral hip joints, noted mild loss of joint mobility on the right and moderate loss of joint mobility on the left.  More concerning, during examination patient continues to have fluctuating symptoms along the right sciatic pathway with passive ranging of both hip joints.  From this examination there is no clear etiology explaining this phenomenon  other than questionable complication with lumbar spinal hardware.  As patient is now pending follow-up with her back surgeon and PT is having difficulty not aggravating patient's sciatic like symptoms, the decision has been made to cancel all physical therapy treatment sessions until the patient can follow-up with her back surgeon to rule out any complications of hardware.  Patient is agreeable with this plan.    Personal Factors and Comorbidities Comorbidity 3+;Age;Fitness;Past/Current Experience;Time since onset of injury/illness/exacerbation    Comorbidities Anxiety, asthma, depression, DM, hx of kidney stones, obesity, RA    Examination-Activity Limitations Bed Mobility;Stand;Lift;Locomotion Level;Bend;Transfers;Carry    Stability/Clinical Decision Making Evolving/Moderate complexity    Clinical Decision Making Moderate    Rehab Potential Fair    PT Frequency 2x / week    PT Duration 8 weeks    PT Treatment/Interventions Therapeutic exercise;Balance training;Neuromuscular re-education;Therapeutic activities;Patient/family education;Manual techniques;Dry needling;Aquatic Therapy;Electrical Stimulation;Iontophoresis 4mg /ml Dexamethasone    PT Next Visit Plan Posture, trunk,m hip strengthening, manual techniques, modalities PRN    PT Home Exercise Plan No changes this date    Consulted and Agree with Plan  of Care Patient             Patient will benefit from skilled therapeutic intervention in order to improve the following deficits and impairments:  Pain, Improper body mechanics, Postural dysfunction, Decreased strength, Decreased balance  Visit Diagnosis: Radiculopathy, lumbosacral region  Chronic right-sided low back pain, unspecified whether sciatica present  Muscle weakness (generalized)     Problem List Patient Active Problem List   Diagnosis Date Noted   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 edema associated with type 2 diabetes mellitus  (HCC) 01/03/2019   Chronic cough 11/19/2016   Morbid obesity (HCC) 09/26/2016   Left ovarian cyst 05/30/2016   S/P lumbar spinal fusion 12/13/2015   Aortic calcification (HCC) 07/18/2015   Controlled type 2 diabetes mellitus without complication (HCC) 05/16/2015   Thrombocytopenia (HCC) 05/16/2015   Recurrent major depressive disorder, in full remission (HCC) 05/16/2015   Essential (primary) hypertension 05/16/2015   Fatty infiltration of liver 03/15/2015   Aortic valve stenosis, nonrheumatic 01/18/2015   Sciatica of right side 01/09/2015   Type 2 diabetes mellitus (HCC) 11/10/2014   RLS (restless legs syndrome) 08/23/2014   Neuritis or radiculitis due to rupture of lumbar intervertebral disc 06/13/2014   Degeneration of intervertebral disc of lumbar region 06/13/2014   Lumbar radiculitis 06/13/2014   Arthritis 01/23/2014   Arthritis of knee, degenerative 11/15/2013   Depression 09/29/2013   Insomnia 09/29/2013   Apnea, sleep 08/29/2013   History of nephrolithiasis 08/29/2013   Abnormal presence of protein in urine 08/29/2013   Headache, migraine 08/29/2013   BP (high blood pressure) 08/29/2013   HLD (hyperlipidemia) 08/29/2013   Allergic rhinitis 08/29/2013   Intractable migraine without aura 02/18/2013    3:50 PM, 01/01/21 Rosamaria Lints, PT, DPT Physical Therapist - [place] [phone] (Office)   Malayia Spizzirri C 01/01/2021, 3:49 PM  [place]ICINE [address] [place], [address] Phone: [phone]   Fax:  [phone]  Name: LEXY MEININGER MRN: [id number] Date of Birth: [birth year]

## 2021-01-03 ENCOUNTER — Ambulatory Visit: Payer: Medicare HMO

## 2021-01-08 ENCOUNTER — Ambulatory Visit: Payer: Medicare HMO | Attending: Family Medicine

## 2021-01-08 DIAGNOSIS — M545 Low back pain, unspecified: Secondary | ICD-10-CM | POA: Insufficient documentation

## 2021-01-08 DIAGNOSIS — G8929 Other chronic pain: Secondary | ICD-10-CM | POA: Diagnosis present

## 2021-01-08 DIAGNOSIS — M6281 Muscle weakness (generalized): Secondary | ICD-10-CM | POA: Diagnosis present

## 2021-01-08 DIAGNOSIS — M5417 Radiculopathy, lumbosacral region: Secondary | ICD-10-CM | POA: Diagnosis not present

## 2021-01-08 NOTE — Therapy (Signed)
[place] [facility] [address] [place], [address] Phone: [phone]   Fax:  [phone]  Physical Therapy Treatment  Patient Details  Name: Jessica Cole MRN: [id number] Date of Birth: 03-23-1951 Referring Provider (PT): Burman Freestone, NP   Encounter Date: 01/08/2021   PT End of Session - 01/08/21 0807     Visit Number 12    Number of Visits 17    Date for PT Re-Evaluation 01/24/21    Authorization Type [employer]    Authorization Time Period 11/26/20-01/24/21    Progress Note Due on Visit 20    PT Start Time 0807    PT Stop Time 0848    PT Time Calculation (min) 41 min    Activity Tolerance Patient tolerated treatment well;No increased pain    Behavior During Therapy WFL for tasks assessed/performed             Past Medical History:  Diagnosis Date   Anxiety    Asthma    seasonal with allergies   Colon polyps    Degenerative arthritis    Depression    Diabetes mellitus without complication (HCC)    Type II   Environmental allergies    Family history of adverse reaction to anesthesia    sister- PONV   Fatty liver    Glaucoma    Headache(784.0)    HX  MIGRAINES   History of bronchitis    History of kidney stones    History of pneumonia    Hypertension    Hypothyroidism    Obesity    OSA on CPAP    Plantar fasciitis    right   PONV (postoperative nausea and vomiting)    Renal calculi    Renal calculi    Rheumatoid arthritis (HCC)    RLS (restless legs syndrome) 08/23/2014    Past Surgical History:  Procedure Laterality Date   ABDOMINAL HYSTERECTOMY     partial   APPENDECTOMY     Arthroscopic surgery, knee Left    BREAST BIOPSY Right    several   BREAST BIOPSY Right 03/11/2017   u/s bx neg   BUNIONECTOMY     LEFT    COLONOSCOPY W/ POLYPECTOMY     COLONOSCOPY WITH PROPOFOL N/A 08/14/2017   Procedure: COLONOSCOPY WITH PROPOFOL;  Surgeon: Christena Deem, MD;  Location:  [facility] ENDOSCOPY;  Service: Endoscopy;  Laterality: N/A;   EXTRACORPOREAL SHOCK WAVE LITHOTRIPSY Left 05/14/2017   Procedure: EXTRACORPOREAL SHOCK WAVE LITHOTRIPSY (ESWL);  Surgeon: Riki Altes, MD;  Location: [facility] ORS;  Service: Urology;  Laterality: Left;   EXTRACORPOREAL SHOCK WAVE LITHOTRIPSY Left 06/14/2020   Procedure: EXTRACORPOREAL SHOCK WAVE LITHOTRIPSY (ESWL);  Surgeon: Sondra Come, MD;  Location: [facility] ORS;  Service: Urology;  Laterality: Left;   FOOT SURGERY     RIGHT     KNEE ARTHROSCOPY W/ MENISCAL REPAIR Left    LASIK     MAXIMUM ACCESS (MAS)POSTERIOR LUMBAR INTERBODY FUSION (PLIF) 1 LEVEL N/A 04/25/2016   Procedure: LUMBAR FOUR-FIVE  MAXIMUM ACCESS (MAS) POSTERIOR LUMBAR INTERBODY FUSION (PLIF) with extension of instrumentation LUMBAR TWO-FIVE;  Surgeon: Tia Alert, MD;  Location: [facility] OR;  Service: Neurosurgery;  Laterality: N/A;   MAXIMUM ACCESS (MAS)POSTERIOR LUMBAR INTERBODY FUSION (PLIF) 2 LEVEL N/A 12/13/2015   Procedure: Lumbar two-three - Lumbar three-four MAXIMUM ACCESS (MAS) POSTERIOR LUMBAR INTERBODY FUSION (PLIF)  ;  Surgeon: Tia Alert, MD;  Location: [facility] NEURO ORS;  Service:  Neurosurgery;  Laterality: N/A;   REVERSE SHOULDER ARTHROPLASTY Left 03/01/2020   Procedure: REVERSE SHOULDER ARTHROPLASTY;  Surgeon: Christena Flake, MD;  Location: [facility] ORS;  Service: Orthopedics;  Laterality: Left;   SHOULDER SURGERY     RIGHT    TONSILLECTOMY      There were no vitals filed for this visit.   Subjective Assessment - 01/08/21 0810     Subjective Has an appointment with her back surgoen in [place] (Dr. Marikay Alar). Saw Dr. Yetta Barre, going to have and MRI done for her back. Dr. Yetta Barre wants pt to continue PT. A little bit of pain and nothing like it has been. Can move good this morning. Walked and bend down better. Notices L lateral hip and proximal thigh pain (proximal L5 dermatome). Also has B TTP greater trochanter R > L. Dr. Yetta Barre feels like it is sciatic nerve.     Pertinent History Low back pain with RLE radiating symptoms. Pain in R posterior hip with radiating down along R L5 dermatome to ankle. Pain appeared since after [place] 2021. Got a steroid injection 08/2020 which did not help. Sep 10, 2020, pt had 2 more steroid injections for her back which helped but pain returned July 2022. Went to the ER July 3 due to back pain. Pt was at the beach and fell while in waist deep water.  The day before, pt fell backwards and her back hit the sand when pt stepped onto a dip in the sand.  Stepped on her R shoe lace the night before leaving the beach and fell to her L side onto the floor on November 15, 2020 evening while packing up. Pain has worsend since her beach trip. Denies loss of bowel control, sees a doctor for her bladder. Denies saddle anesthesia.    Patient Stated Goals Decrease pain.    Currently in Pain? Yes    Pain Score 5    R posterior hip and posterior lateral thigh. 4-5/10                                      PT Education - 01/08/21 0819     Education Details ther-ex    Person(s) Educated Patient    Methods Explanation;Demonstration;Tactile cues;Verbal cues    Comprehension Returned demonstration;Verbalized understanding             Objectives Therapeutic exercise  Seated hip adductor pillow squeeze 10x5 seconds for 3 sets  Seated hip IR   R 10x3  Decreased symptoms after aforementioned exercises  Seated B scapular retraction 10x3 with 5 second holds   Seated manually resisted L lateral shift isometrics in neutral to promote more neutral trunk posture 10x5 seconds for 3 sets  Seated R piriformis stretch 15 seconds x 5  Seated hip extension isometrics   R 10x5 seconds for 3 sets  Seated B ankle DF/PF 10x3  Standing transversus abdominis contraction 10x5 seconds   Standing L lumbar side bend 10x5 seconds   Pt reported afterwards that it was uncomfortable  Seated trunk flexion to the L 5 seconds.  Uncomfortable.   Seated manually resisted R upper trunk rotation to promote more neutral lumbar position 10x5 seconds     Improved exercise technique, movement at target joints, use of target muscles after mod verbal, visual, tactile cues.    Response to treatment Fair tolerance to today's session.   Clinical impression  Pt returns  to clinic after seeing her back surgeon, whom pt states feels like her symptoms is sciatica and would like her to continue PT. Has and MRI scheduled in the near future. Symptoms seem to increase with forward flexion as well as L side bending movements. Symptoms seem to decrease with decreasing piriformis muscle activation and gently promoting neural mobility. Fair tolerance to today's session. Pt will benefit from continued skilled physical therapy services to decrease pain, improve strength and function.        PT Short Term Goals - 11/26/20 1316       PT SHORT TERM GOAL #1   Title Pt will be independent with her initial HEP to decrease pain, improve strength, function, and ability to perform transfers more comfortably and with less difficulty.    Time 3    Period Weeks    Status New    Target Date 12/20/20               PT Long Term Goals - 12/27/20 1516       PT LONG TERM GOAL #1   Title Pt will have a decrease in low back pain to 6/10 or less at worst to promote ability to perform transfers, bed mobility as well as perform standing tasks more comfortably.    Baseline 20/10 low back pain at worst for the past 3 months (11/26/2020)    Time 8    Period Weeks    Status New    Target Date 01/24/21      PT LONG TERM GOAL #2   Title Patient will have a decrease in R LE pain to 6/10 or less at worst to promote ability to perform transfers, bed mobility as well as perform standing tasks more comfortably.    Baseline 20/10 R LE pain at worst to R foot for the past 3 months (11/26/2020)    Time 8    Period Weeks    Status New    Target Date  01/24/21      PT LONG TERM GOAL #3   Title Patient will improve her lumbar FOTO score by at least 10 points as a demonstration of improved function.    Baseline Lumbar FOTO: pt unable to complete (11/26/2020); 8/25: 40/49    Time 8    Period Weeks    Status New    Target Date 01/24/21      PT LONG TERM GOAL #4   Title Pt will improve bilateral hip extension and abduction strength by at least 1/2 MMT grade to promote ability to stand, ambulate and perform transfers more comfortably.    Baseline Hip extension 4-/5 R and L, hip abduction 4/5 R and L (11/26/2020)    Time 8    Period Weeks    Status New    Target Date 01/24/21                   Plan - 01/08/21 0819     Clinical Impression Statement Pt returns to clinic after seeing her back surgeon, whom pt states feels like her symptoms is sciatica and would like her to continue PT. Has and MRI scheduled in the near future. Symptoms seem to increase with forward flexion as well as L side bending movements. Symptoms seem to decrease with decreasing piriformis muscle activation and gently promoting neural mobility. Fair tolerance to today's session. Pt will benefit from continued skilled physical therapy services to decrease pain, improve strength and function.    Personal Factors  and Comorbidities Comorbidity 3+;Age;Fitness;Past/Current Experience;Time since onset of injury/illness/exacerbation    Comorbidities Anxiety, asthma, depression, DM, hx of kidney stones, obesity, RA    Examination-Activity Limitations Bed Mobility;Stand;Lift;Locomotion Level;Bend;Transfers;Carry    Stability/Clinical Decision Making Evolving/Moderate complexity    Rehab Potential Fair    PT Frequency 2x / week    PT Duration 8 weeks    PT Treatment/Interventions Therapeutic exercise;Balance training;Neuromuscular re-education;Therapeutic activities;Patient/family education;Manual techniques;Dry needling;Aquatic Therapy;Electrical Stimulation;Iontophoresis  4mg /ml Dexamethasone    PT Next Visit Plan Posture, trunk,m hip strengthening, manual techniques, modalities PRN    PT Home Exercise Plan No changes this date    Consulted and Agree with Plan of Care Patient             Patient will benefit from skilled therapeutic intervention in order to improve the following deficits and impairments:  Pain, Improper body mechanics, Postural dysfunction, Decreased strength, Decreased balance  Visit Diagnosis: Radiculopathy, lumbosacral region  Chronic right-sided low back pain, unspecified whether sciatica present  Muscle weakness (generalized)     Problem List Patient Active Problem List   Diagnosis Date Noted   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) 01/03/2019   Chronic cough 11/19/2016   Morbid obesity (HCC) 09/26/2016   Left ovarian cyst 05/30/2016   S/P lumbar spinal fusion 12/13/2015   Aortic calcification (HCC) 07/18/2015   Controlled type 2 diabetes mellitus without complication (HCC) 05/16/2015   Thrombocytopenia (HCC) 05/16/2015   Recurrent major depressive disorder, in full remission (HCC) 05/16/2015   Essential (primary) hypertension 05/16/2015   Fatty infiltration of liver 03/15/2015   Aortic valve stenosis, nonrheumatic 01/18/2015   Sciatica of right side 01/09/2015   Type 2 diabetes mellitus (HCC) 11/10/2014   RLS (restless legs syndrome) 08/23/2014   Neuritis or radiculitis due to rupture of lumbar intervertebral disc 06/13/2014   Degeneration of intervertebral disc of lumbar region 06/13/2014   Lumbar radiculitis 06/13/2014   Arthritis 01/23/2014   Arthritis of knee, degenerative 11/15/2013   Depression 09/29/2013   Insomnia 09/29/2013   Apnea, sleep 08/29/2013   History of nephrolithiasis 08/29/2013   Abnormal presence of protein in urine 08/29/2013   Headache, migraine 08/29/2013   BP (high blood pressure) 08/29/2013   HLD  (hyperlipidemia) 08/29/2013   Allergic rhinitis 08/29/2013   Intractable migraine without aura 02/18/2013   Loralyn FreshwaterMiguel Manya Balash PT, DPT   01/08/2021, 9:09 AM   [facility] [address] [place], [place], [address] Phone: [phone]   Fax:  [phone]  Name: Nobie PutnamGail T Mancera MRN: [id number] Date of Birth: 03/16/1951

## 2021-01-10 ENCOUNTER — Other Ambulatory Visit (HOSPITAL_COMMUNITY): Payer: Self-pay | Admitting: Neurological Surgery

## 2021-01-10 ENCOUNTER — Other Ambulatory Visit: Payer: Self-pay | Admitting: Neurological Surgery

## 2021-01-10 ENCOUNTER — Ambulatory Visit: Payer: Medicare HMO

## 2021-01-10 DIAGNOSIS — M5417 Radiculopathy, lumbosacral region: Secondary | ICD-10-CM | POA: Diagnosis not present

## 2021-01-10 DIAGNOSIS — G8929 Other chronic pain: Secondary | ICD-10-CM

## 2021-01-10 DIAGNOSIS — M6281 Muscle weakness (generalized): Secondary | ICD-10-CM

## 2021-01-10 DIAGNOSIS — M545 Low back pain, unspecified: Secondary | ICD-10-CM

## 2021-01-10 NOTE — Therapy (Signed)
[place] [facility] [address] [place], [place], [address] Phone: [phone]   Fax:  [phone]  Physical Therapy Treatment  Patient Details  Name: Jessica Cole MRN: [id number] Date of Birth: Nov 07, 1950 Referring Provider (PT): Burman Freestone, NP   Encounter Date: 01/10/2021   PT End of Session - 01/10/21 0807     Visit Number 13    Number of Visits 17    Date for PT Re-Evaluation 01/24/21    Authorization Type [employer] Medicare    Authorization Time Period 11/26/20-01/24/21    Progress Note Due on Visit 20    PT Start Time 0805    PT Stop Time 0855    PT Time Calculation (min) 50 min    Activity Tolerance Patient tolerated treatment well    Behavior During Therapy [facility] for tasks assessed/performed             Past Medical History:  Diagnosis Date   Anxiety    Asthma    seasonal with allergies   Colon polyps    Degenerative arthritis    Depression    Diabetes mellitus without complication (HCC)    Type II   Environmental allergies    Family history of adverse reaction to anesthesia    sister- PONV   Fatty liver    Glaucoma    Headache(784.0)    HX  MIGRAINES   History of bronchitis    History of kidney stones    History of pneumonia    Hypertension    Hypothyroidism    Obesity    OSA on CPAP    Plantar fasciitis    right   PONV (postoperative nausea and vomiting)    Renal calculi    Renal calculi    Rheumatoid arthritis (HCC)    RLS (restless legs syndrome) 08/23/2014    Past Surgical History:  Procedure Laterality Date   ABDOMINAL HYSTERECTOMY     partial   APPENDECTOMY     Arthroscopic surgery, knee Left    BREAST BIOPSY Right    several   BREAST BIOPSY Right 03/11/2017   u/s bx neg   BUNIONECTOMY     LEFT    COLONOSCOPY W/ POLYPECTOMY     COLONOSCOPY WITH PROPOFOL N/A 08/14/2017   Procedure: COLONOSCOPY WITH PROPOFOL;  Surgeon: Christena Deem, MD;  Location: [facility]COPY;   Service: Endoscopy;  Laterality: N/A;   EXTRACORPOREAL SHOCK WAVE LITHOTRIPSY Left 05/14/2017   Procedure: EXTRACORPOREAL SHOCK WAVE LITHOTRIPSY (ESWL);  Surgeon: Riki Altes, MD;  Location: [facility] ORS;  Service: Urology;  Laterality: Left;   EXTRACORPOREAL SHOCK WAVE LITHOTRIPSY Left 06/14/2020   Procedure: EXTRACORPOREAL SHOCK WAVE LITHOTRIPSY (ESWL);  Surgeon: Sondra Come, MD;  Location: [facility] ORS;  Service: Urology;  Laterality: Left;   FOOT SURGERY     RIGHT     KNEE ARTHROSCOPY W/ MENISCAL REPAIR Left    LASIK     MAXIMUM ACCESS (MAS)POSTERIOR LUMBAR INTERBODY FUSION (PLIF) 1 LEVEL N/A 04/25/2016   Procedure: LUMBAR FOUR-FIVE  MAXIMUM ACCESS (MAS) POSTERIOR LUMBAR INTERBODY FUSION (PLIF) with extension of instrumentation LUMBAR TWO-FIVE;  Surgeon: Tia Alert, MD;  Location: [facility] OR;  Service: Neurosurgery;  Laterality: N/A;   MAXIMUM ACCESS (MAS)POSTERIOR LUMBAR INTERBODY FUSION (PLIF) 2 LEVEL N/A 12/13/2015   Procedure: Lumbar two-three - Lumbar three-four MAXIMUM ACCESS (MAS) POSTERIOR LUMBAR INTERBODY FUSION (PLIF)  ;  Surgeon: Tia Alert, MD;  Location: [facility] NEURO ORS;  Service: Neurosurgery;  Laterality: N/A;   REVERSE SHOULDER ARTHROPLASTY Left 03/01/2020   Procedure: REVERSE SHOULDER ARTHROPLASTY;  Surgeon: Christena FlakePoggi, John J, MD;  Location: [facility] ORS;  Service: Orthopedics;  Laterality: Left;   SHOULDER SURGERY     RIGHT    TONSILLECTOMY      There were no vitals filed for this visit.   Subjective Assessment - 01/10/21 0807     Subjective Back has been bothering her some. Bothered her last night when she got to bed. Could not hardly walk. This morning her back is not as bad. Feels L posterior hip as well as R posterior lateral thigh and leg pain currently 8/10.    Pertinent History Low back pain with RLE radiating symptoms. Pain in R posterior hip with radiating down along R L5 dermatome to ankle. Pain appeared since after [place] 2021. Got a steroid injection 08/2020 which  did not help. Sep 10, 2020, pt had 2 more steroid injections for her back which helped but pain returned July 2022. Went to the ER July 3 due to back pain. Pt was at the beach and fell while in waist deep water.  The day before, pt fell backwards and her back hit the sand when pt stepped onto a dip in the sand.  Stepped on her R shoe lace the night before leaving the beach and fell to her L side onto the floor on November 15, 2020 evening while packing up. Pain has worsend since her beach trip. Denies loss of bowel control, sees a doctor for her bladder. Denies saddle anesthesia.    Patient Stated Goals Decrease pain.    Currently in Pain? Yes    Pain Score 8                                         PT Education - 01/10/21 0829     Education Details ther-ex    Person(s) Educated Patient    Methods Explanation;Demonstration;Tactile cues;Verbal cues    Comprehension Returned demonstration;Verbalized understanding             Objectives  Manual therapy  L S/L R piriformis muscle to decrease tension    Therapeutic exercise    Supine R hip IR stretch with PT 15 seconds x 8  Supine R hip IR PROM with PT at 90/90 hip position 10x2  Seated hip adductor pillow squeeze 10x5 seconds for 3 sets   Seated hip IR              R 10x3  Improved exercise technique, movement at target joints, use of target muscles after mod verbal, visual, tactile cues.    Electrical stimulation High Volt E-stim R posterior hip and low back. At 180 V, channel 1 for pain control  15 minutes  Unclear whether or not if the modality had any positive effect.         Response to treatment Fair tolerance to today's session.    Clinical impression  TTP R posterior hip with reproduction of R LE sciatic symptoms. Continued working on improving R piriformis and hip IR mobility to help address. Added High Volt E-stim to R posterior hip and low back for pain control. Fair tolerance to  today's session. Pt will benefit from continued skilled physical therapy services to decrease pain, improve strength and function.            PT Short Term  Goals - 11/26/20 1316       PT SHORT TERM GOAL #1   Title Pt will be independent with her initial HEP to decrease pain, improve strength, function, and ability to perform transfers more comfortably and with less difficulty.    Time 3    Period Weeks    Status New    Target Date 12/20/20               PT Long Term Goals - 12/27/20 1516       PT LONG TERM GOAL #1   Title Pt will have a decrease in low back pain to 6/10 or less at worst to promote ability to perform transfers, bed mobility as well as perform standing tasks more comfortably.    Baseline 20/10 low back pain at worst for the past 3 months (11/26/2020)    Time 8    Period Weeks    Status New    Target Date 01/24/21      PT LONG TERM GOAL #2   Title Patient will have a decrease in R LE pain to 6/10 or less at worst to promote ability to perform transfers, bed mobility as well as perform standing tasks more comfortably.    Baseline 20/10 R LE pain at worst to R foot for the past 3 months (11/26/2020)    Time 8    Period Weeks    Status New    Target Date 01/24/21      PT LONG TERM GOAL #3   Title Patient will improve her lumbar FOTO score by at least 10 points as a demonstration of improved function.    Baseline Lumbar FOTO: pt unable to complete (11/26/2020); 8/25: 40/49    Time 8    Period Weeks    Status New    Target Date 01/24/21      PT LONG TERM GOAL #4   Title Pt will improve bilateral hip extension and abduction strength by at least 1/2 MMT grade to promote ability to stand, ambulate and perform transfers more comfortably.    Baseline Hip extension 4-/5 R and L, hip abduction 4/5 R and L (11/26/2020)    Time 8    Period Weeks    Status New    Target Date 01/24/21                   Plan - 01/10/21 0829     Clinical Impression  Statement TTP R posterior hip with reproduction of R LE sciatic symptoms. Continued working on improving R piriformis and hip IR mobility to help address. Added High Volt E-stim to R posterior hip and low back for pain control. Fair tolerance to today's session. Pt will benefit from continued skilled physical therapy services to decrease pain, improve strength and function.    Personal Factors and Comorbidities Comorbidity 3+;Age;Fitness;Past/Current Experience;Time since onset of injury/illness/exacerbation    Comorbidities Anxiety, asthma, depression, DM, hx of kidney stones, obesity, RA    Examination-Activity Limitations Bed Mobility;Stand;Lift;Locomotion Level;Bend;Transfers;Carry    Stability/Clinical Decision Making Evolving/Moderate complexity    Rehab Potential Fair    PT Frequency 2x / week    PT Duration 8 weeks    PT Treatment/Interventions Therapeutic exercise;Balance training;Neuromuscular re-education;Therapeutic activities;Patient/family education;Manual techniques;Dry needling;Aquatic Therapy;Electrical Stimulation;Iontophoresis 4mg /ml Dexamethasone    PT Next Visit Plan Posture, trunk,m hip strengthening, manual techniques, modalities PRN    PT Home Exercise Plan No changes this date    Consulted and Agree with Plan of Care  Patient             Patient will benefit from skilled therapeutic intervention in order to improve the following deficits and impairments:  Pain, Improper body mechanics, Postural dysfunction, Decreased strength, Decreased balance  Visit Diagnosis: Radiculopathy, lumbosacral region  Chronic right-sided low back pain, unspecified whether sciatica present  Muscle weakness (generalized)     Problem List Patient Active Problem List   Diagnosis Date Noted   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) 01/03/2019   Chronic cough 11/19/2016   Morbid obesity (HCC)  09/26/2016   Left ovarian cyst 05/30/2016   S/P lumbar spinal fusion 12/13/2015   Aortic calcification (HCC) 07/18/2015   Controlled type 2 diabetes mellitus without complication (HCC) 05/16/2015   Thrombocytopenia (HCC) 05/16/2015   Recurrent major depressive disorder, in full remission (HCC) 05/16/2015   Essential (primary) hypertension 05/16/2015   Fatty infiltration of liver 03/15/2015   Aortic valve stenosis, nonrheumatic 01/18/2015   Sciatica of right side 01/09/2015   Type 2 diabetes mellitus (HCC) 11/10/2014   RLS (restless legs syndrome) 08/23/2014   Neuritis or radiculitis due to rupture of lumbar intervertebral disc 06/13/2014   Degeneration of intervertebral disc of lumbar region 06/13/2014   Lumbar radiculitis 06/13/2014   Arthritis 01/23/2014   Arthritis of knee, degenerative 11/15/2013   Depression 09/29/2013   Insomnia 09/29/2013   Apnea, sleep 08/29/2013   History of nephrolithiasis 08/29/2013   Abnormal presence of protein in urine 08/29/2013   Headache, migraine 08/29/2013   BP (high blood pressure) 08/29/2013   HLD (hyperlipidemia) 08/29/2013   Allergic rhinitis 08/29/2013   Intractable migraine without aura 02/18/2013    Loralyn Freshwater PT, DPT  01/10/2021, 9:08 AM  [place] [facility] [address], [place], [address] Phone: [phone]   Fax:  [phone]  Name: Jessica Cole MRN: [id number] Date of Birth: 1950/09/22

## 2021-01-14 ENCOUNTER — Ambulatory Visit: Payer: Medicare HMO | Admitting: Podiatry

## 2021-01-15 ENCOUNTER — Ambulatory Visit
Admission: RE | Admit: 2021-01-15 | Discharge: 2021-01-15 | Disposition: A | Discharge status: Home / Self Care | Payer: Medicare HMO | Source: Ambulatory Visit | Attending: Neurological Surgery | Admitting: Neurological Surgery

## 2021-01-15 ENCOUNTER — Other Ambulatory Visit: Payer: Self-pay

## 2021-01-15 DIAGNOSIS — M5417 Radiculopathy, lumbosacral region: Secondary | ICD-10-CM | POA: Insufficient documentation

## 2021-01-16 ENCOUNTER — Ambulatory Visit: Payer: Medicare HMO

## 2021-01-17 ENCOUNTER — Ambulatory Visit: Payer: Medicare HMO

## 2021-01-17 DIAGNOSIS — M545 Low back pain, unspecified: Secondary | ICD-10-CM

## 2021-01-17 DIAGNOSIS — M5417 Radiculopathy, lumbosacral region: Secondary | ICD-10-CM | POA: Diagnosis not present

## 2021-01-17 DIAGNOSIS — G8929 Other chronic pain: Secondary | ICD-10-CM

## 2021-01-17 DIAGNOSIS — M6281 Muscle weakness (generalized): Secondary | ICD-10-CM

## 2021-01-17 NOTE — Therapy (Signed)
[place] [facility] [address] [place], [place], [address] Phone: [phone]   Fax:  [phone]  Physical Therapy Treatment  Patient Details  Name: Jessica Cole MRN: [id number] Date of Birth: 1951-02-28 Referring Provider (PT): Burman Freestone, NP   Encounter Date: 01/17/2021   PT End of Session - 01/17/21 0801     Visit Number 14    Number of Visits 17    Date for PT Re-Evaluation 01/24/21    Authorization Type [employer]    Authorization Time Period 11/26/20-01/24/21    Progress Note Due on Visit 20    PT Start Time 0801    PT Stop Time 0837    PT Time Calculation (min) 36 min    Activity Tolerance Patient tolerated treatment well    Behavior During Therapy [facility] for tasks assessed/performed             Past Medical History:  Diagnosis Date   Anxiety    Asthma    seasonal with allergies   Colon polyps    Degenerative arthritis    Depression    Diabetes mellitus without complication (HCC)    Type II   Environmental allergies    Family history of adverse reaction to anesthesia    sister- PONV   Fatty liver    Glaucoma    Headache(784.0)    HX  MIGRAINES   History of bronchitis    History of kidney stones    History of pneumonia    Hypertension    Hypothyroidism    Obesity    OSA on CPAP    Plantar fasciitis    right   PONV (postoperative nausea and vomiting)    Renal calculi    Renal calculi    Rheumatoid arthritis (HCC)    RLS (restless legs syndrome) 08/23/2014    Past Surgical History:  Procedure Laterality Date   ABDOMINAL HYSTERECTOMY     partial   APPENDECTOMY     Arthroscopic surgery, knee Left    BREAST BIOPSY Right    several   BREAST BIOPSY Right 03/11/2017   u/s bx neg   BUNIONECTOMY     LEFT    COLONOSCOPY W/ POLYPECTOMY     COLONOSCOPY WITH PROPOFOL N/A 08/14/2017   Procedure: COLONOSCOPY WITH PROPOFOL;  Surgeon: Christena Deem, MD;  Location: [facility] ENDOSCOPY;   Service: Endoscopy;  Laterality: N/A;   EXTRACORPOREAL SHOCK WAVE LITHOTRIPSY Left 05/14/2017   Procedure: EXTRACORPOREAL SHOCK WAVE LITHOTRIPSY (ESWL);  Surgeon: Riki Altes, MD;  Location: [facility] ORS;  Service: Urology;  Laterality: Left;   EXTRACORPOREAL SHOCK WAVE LITHOTRIPSY Left 06/14/2020   Procedure: EXTRACORPOREAL SHOCK WAVE LITHOTRIPSY (ESWL);  Surgeon: Sondra Come, MD;  Location: [facility] ORS;  Service: Urology;  Laterality: Left;   FOOT SURGERY     RIGHT     KNEE ARTHROSCOPY W/ MENISCAL REPAIR Left    LASIK     MAXIMUM ACCESS (MAS)POSTERIOR LUMBAR INTERBODY FUSION (PLIF) 1 LEVEL N/A 04/25/2016   Procedure: LUMBAR FOUR-FIVE  MAXIMUM ACCESS (MAS) POSTERIOR LUMBAR INTERBODY FUSION (PLIF) with extension of instrumentation LUMBAR TWO-FIVE;  Surgeon: Tia Alert, MD;  Location: [facility] OR;  Service: Neurosurgery;  Laterality: N/A;   MAXIMUM ACCESS (MAS)POSTERIOR LUMBAR INTERBODY FUSION (PLIF) 2 LEVEL N/A 12/13/2015   Procedure: Lumbar two-three - Lumbar three-four MAXIMUM ACCESS (MAS) POSTERIOR LUMBAR INTERBODY FUSION (PLIF)  ;  Surgeon: Tia Alert, MD;  Location: [facility] NEURO ORS;  Service: Neurosurgery;  Laterality: N/A;   REVERSE SHOULDER ARTHROPLASTY Left 03/01/2020   Procedure: REVERSE SHOULDER ARTHROPLASTY;  Surgeon: Christena Flake, MD;  Location: [facility] ORS;  Service: Orthopedics;  Laterality: Left;   SHOULDER SURGERY     RIGHT    TONSILLECTOMY      There were no vitals filed for this visit.   Subjective Assessment - 01/17/21 0802     Subjective Back is a good morning. Not too bad but still hurting. 4/10 currently. Was kind of bad yesterday afternoon. Got an MRI yesterday. Sees Dr. Yetta Barre this Friday for the MRI results.    Pertinent History Low back pain with RLE radiating symptoms. Pain in R posterior hip with radiating down along R L5 dermatome to ankle. Pain appeared since after [place] 2021. Got a steroid injection 08/2020 which did not help. Sep 10, 2020, pt had 2 more steroid  injections for her back which helped but pain returned July 2022. Went to the ER July 3 due to back pain. Pt was at the beach and fell while in waist deep water.  The day before, pt fell backwards and her back hit the sand when pt stepped onto a dip in the sand.  Stepped on her R shoe lace the night before leaving the beach and fell to her L side onto the floor on November 15, 2020 evening while packing up. Pain has worsend since her beach trip. Denies loss of bowel control, sees a doctor for her bladder. Denies saddle anesthesia.    Patient Stated Goals Decrease pain.    Pain Score 4                                         PT Education - 01/17/21 0808     Education Details ther-ex    Person(s) Educated Patient    Methods Explanation;Demonstration;Tactile cues;Verbal cues    Comprehension Returned demonstration;Verbalized understanding              Objectives  Therapeutic exercise  Standing B scapular retraction yellow band 10x3  Standing glute max squeeze 10x5 seconds  R low back discomfort   Standing B ankle heel toe raises 10x2  R LE discomfort  Seated hip adduction isometrics pillow squeeze 7x5 seconds   Seated B shoulder extension isometrics hands on thighs 5 seconds 3x. No change   Seated glute max squeeze 10x5 seconds   Seated gentle manually resisted R upper trunk rotation isometrics to promote more neutral low back posture 10x5 seconds for 3 sets  Decreased pain  Seated gentle manually resisted L lateral shift isometrics in neutral to promote more neutral lumbar posture 10x5 seconds   Slight L LE discomfort  Seated gentle manually resisted trunk flexion isometrics in neutral 3x5 seconds   Seated gentle manually resisted trunk extension isometrics 5 seconds x 2. Increased symptoms  Seated B scapular retraction 2x5 seconds. Increased symptoms.    Again: Seated gentle manually resisted R upper trunk rotation isometrics to promote more  neutral low back posture 10x5 seconds for 3 sets  Decreased pain initially.        Improved exercise technique, movement at target joints, use of target muscles after mod verbal, visual, tactile cues.       Response to treatment Fair tolerance to today's session. Limited by pain.      Clinical impression  Fair tolerance to today's session. Pt demonstrates irritability of  symptoms. Slight decrease in pain initially when decreasing excessive lumbar rotation gently but symptoms increased with more repetition. Session ended slightly early so as to not increase pt symptoms more.        PT Short Term Goals - 11/26/20 1316       PT SHORT TERM GOAL #1   Title Pt will be independent with her initial HEP to decrease pain, improve strength, function, and ability to perform transfers more comfortably and with less difficulty.    Time 3    Period Weeks    Status New    Target Date 12/20/20               PT Long Term Goals - 12/27/20 1516       PT LONG TERM GOAL #1   Title Pt will have a decrease in low back pain to 6/10 or less at worst to promote ability to perform transfers, bed mobility as well as perform standing tasks more comfortably.    Baseline 20/10 low back pain at worst for the past 3 months (11/26/2020)    Time 8    Period Weeks    Status New    Target Date 01/24/21      PT LONG TERM GOAL #2   Title Patient will have a decrease in R LE pain to 6/10 or less at worst to promote ability to perform transfers, bed mobility as well as perform standing tasks more comfortably.    Baseline 20/10 R LE pain at worst to R foot for the past 3 months (11/26/2020)    Time 8    Period Weeks    Status New    Target Date 01/24/21      PT LONG TERM GOAL #3   Title Patient will improve her lumbar FOTO score by at least 10 points as a demonstration of improved function.    Baseline Lumbar FOTO: pt unable to complete (11/26/2020); 8/25: 40/49    Time 8    Period Weeks    Status  New    Target Date 01/24/21      PT LONG TERM GOAL #4   Title Pt will improve bilateral hip extension and abduction strength by at least 1/2 MMT grade to promote ability to stand, ambulate and perform transfers more comfortably.    Baseline Hip extension 4-/5 R and L, hip abduction 4/5 R and L (11/26/2020)    Time 8    Period Weeks    Status New    Target Date 01/24/21                   Plan - 01/17/21 0757     Clinical Impression Statement Fair tolerance to today's session. Pt demonstrates irritability of symptoms. Slight decrease in pain initially when decreasing excessive lumbar rotation gently but symptoms increased with more repetition. Session ended slightly early so as to not increase pt symptoms more.    Personal Factors and Comorbidities Comorbidity 3+;Age;Fitness;Past/Current Experience;Time since onset of injury/illness/exacerbation    Comorbidities Anxiety, asthma, depression, DM, hx of kidney stones, obesity, RA    Examination-Activity Limitations Bed Mobility;Stand;Lift;Locomotion Level;Bend;Transfers;Carry    Stability/Clinical Decision Making Evolving/Moderate complexity    Clinical Decision Making Moderate    Rehab Potential Fair    PT Frequency 2x / week    PT Duration 8 weeks    PT Treatment/Interventions Therapeutic exercise;Balance training;Neuromuscular re-education;Therapeutic activities;Patient/family education;Manual techniques;Dry needling;Aquatic Therapy;Electrical Stimulation;Iontophoresis 4mg /ml Dexamethasone    PT Next Visit Plan Posture, trunk,m  hip strengthening, manual techniques, modalities PRN    PT Home Exercise Plan No changes this date    Consulted and Agree with Plan of Care Patient             Patient will benefit from skilled therapeutic intervention in order to improve the following deficits and impairments:  Pain, Improper body mechanics, Postural dysfunction, Decreased strength, Decreased balance  Visit  Diagnosis: Radiculopathy, lumbosacral region  Chronic right-sided low back pain, unspecified whether sciatica present  Muscle weakness (generalized)     Problem List Patient Active Problem List   Diagnosis Date Noted   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) 01/03/2019   Chronic cough 11/19/2016   Morbid obesity (HCC) 09/26/2016   Left ovarian cyst 05/30/2016   S/P lumbar spinal fusion 12/13/2015   Aortic calcification (HCC) 07/18/2015   Controlled type 2 diabetes mellitus without complication (HCC) 05/16/2015   Thrombocytopenia (HCC) 05/16/2015   Recurrent major depressive disorder, in full remission (HCC) 05/16/2015   Essential (primary) hypertension 05/16/2015   Fatty infiltration of liver 03/15/2015   Aortic valve stenosis, nonrheumatic 01/18/2015   Sciatica of right side 01/09/2015   Type 2 diabetes mellitus (HCC) 11/10/2014   RLS (restless legs syndrome) 08/23/2014   Neuritis or radiculitis due to rupture of lumbar intervertebral disc 06/13/2014   Degeneration of intervertebral disc of lumbar region 06/13/2014   Lumbar radiculitis 06/13/2014   Arthritis 01/23/2014   Arthritis of knee, degenerative 11/15/2013   Depression 09/29/2013   Insomnia 09/29/2013   Apnea, sleep 08/29/2013   History of nephrolithiasis 08/29/2013   Abnormal presence of protein in urine 08/29/2013   Headache, migraine 08/29/2013   BP (high blood pressure) 08/29/2013   HLD (hyperlipidemia) 08/29/2013   Allergic rhinitis 08/29/2013   Intractable migraine without aura 02/18/2013    Loralyn Freshwater PT, DPT   01/17/2021, 9:03 AM  [place] [facility] [address] [place], [address] Phone: [phone]   Fax:  [phone]  Name: Jessica Cole MRN: [id number] Date of Birth: January 11, 1951

## 2021-01-21 ENCOUNTER — Ambulatory Visit: Payer: Medicare HMO

## 2021-01-21 DIAGNOSIS — M6281 Muscle weakness (generalized): Secondary | ICD-10-CM

## 2021-01-21 DIAGNOSIS — M545 Low back pain, unspecified: Secondary | ICD-10-CM

## 2021-01-21 DIAGNOSIS — M5417 Radiculopathy, lumbosacral region: Secondary | ICD-10-CM

## 2021-01-21 NOTE — Therapy (Signed)
[place] [facility] [address] [place], [place], [address] Phone: [phone]   Fax:  [phone]  Physical Therapy Treatment  Patient Details  Name: Jessica Cole MRN: [id number] Date of Birth: Aug 21, 1950 Referring Provider (PT): Burman Freestone, NP   Encounter Date: 01/21/2021   PT End of Session - 01/21/21 1547     Visit Number 15    Number of Visits 17    Date for PT Re-Evaluation 01/24/21    Authorization Type [employer] Medicare    Authorization Time Period 11/26/20-01/24/21    Progress Note Due on Visit 20    PT Start Time 1547    PT Stop Time 1613    PT Time Calculation (min) 26 min    Activity Tolerance Patient tolerated treatment well    Behavior During Therapy [facility] for tasks assessed/performed             Past Medical History:  Diagnosis Date   Anxiety    Asthma    seasonal with allergies   Colon polyps    Degenerative arthritis    Depression    Diabetes mellitus without complication (HCC)    Type II   Environmental allergies    Family history of adverse reaction to anesthesia    sister- PONV   Fatty liver    Glaucoma    Headache(784.0)    HX  MIGRAINES   History of bronchitis    History of kidney stones    History of pneumonia    Hypertension    Hypothyroidism    Obesity    OSA on CPAP    Plantar fasciitis    right   PONV (postoperative nausea and vomiting)    Renal calculi    Renal calculi    Rheumatoid arthritis (HCC)    RLS (restless legs syndrome) 08/23/2014    Past Surgical History:  Procedure Laterality Date   ABDOMINAL HYSTERECTOMY     partial   APPENDECTOMY     Arthroscopic surgery, knee Left    BREAST BIOPSY Right    several   BREAST BIOPSY Right 03/11/2017   u/s bx neg   BUNIONECTOMY     LEFT    COLONOSCOPY W/ POLYPECTOMY     COLONOSCOPY WITH PROPOFOL N/A 08/14/2017   Procedure: COLONOSCOPY WITH PROPOFOL;  Surgeon: Christena Deem, MD;  Location: [facility] ENDOSCOPY;   Service: Endoscopy;  Laterality: N/A;   EXTRACORPOREAL SHOCK WAVE LITHOTRIPSY Left 05/14/2017   Procedure: EXTRACORPOREAL SHOCK WAVE LITHOTRIPSY (ESWL);  Surgeon: Riki Altes, MD;  Location: [facility] ORS;  Service: Urology;  Laterality: Left;   EXTRACORPOREAL SHOCK WAVE LITHOTRIPSY Left 06/14/2020   Procedure: EXTRACORPOREAL SHOCK WAVE LITHOTRIPSY (ESWL);  Surgeon: Sondra Come, MD;  Location: [facility] ORS;  Service: Urology;  Laterality: Left;   FOOT SURGERY     RIGHT     KNEE ARTHROSCOPY W/ MENISCAL REPAIR Left    LASIK     MAXIMUM ACCESS (MAS)POSTERIOR LUMBAR INTERBODY FUSION (PLIF) 1 LEVEL N/A 04/25/2016   Procedure: LUMBAR FOUR-FIVE  MAXIMUM ACCESS (MAS) POSTERIOR LUMBAR INTERBODY FUSION (PLIF) with extension of instrumentation LUMBAR TWO-FIVE;  Surgeon: Tia Alert, MD;  Location: [facility] OR;  Service: Neurosurgery;  Laterality: N/A;   MAXIMUM ACCESS (MAS)POSTERIOR LUMBAR INTERBODY FUSION (PLIF) 2 LEVEL N/A 12/13/2015   Procedure: Lumbar two-three - Lumbar three-four MAXIMUM ACCESS (MAS) POSTERIOR LUMBAR INTERBODY FUSION (PLIF)  ;  Surgeon: Tia Alert, MD;  Location: [facility] NEURO ORS;  Service: Neurosurgery;  Laterality: N/A;   REVERSE SHOULDER ARTHROPLASTY Left 03/01/2020   Procedure: REVERSE SHOULDER ARTHROPLASTY;  Surgeon: Christena Flake, MD;  Location: [facility] ORS;  Service: Orthopedics;  Laterality: Left;   SHOULDER SURGERY     RIGHT    TONSILLECTOMY      There were no vitals filed for this visit.   Subjective Assessment - 01/21/21 1548     Subjective Back feels pretty good. Just the R buttock and R lower leg bothers her. Bending over sometimes is almost impossible. 10/10 low back and R LE pain currently when walking from the waiting room to treatment room. Was ok after last session.  Has an appointment with Dr. Yetta Barre later this week. Has not taken her pain medication yesterday or today.    Pertinent History Low back pain with RLE radiating symptoms. Pain in R posterior hip with radiating  down along R L5 dermatome to ankle. Pain appeared since after [place] 2021. Got a steroid injection 08/2020 which did not help. Sep 10, 2020, pt had 2 more steroid injections for her back which helped but pain returned July 2022. Went to the ER July 3 due to back pain. Pt was at the beach and fell while in waist deep water.  The day before, pt fell backwards and her back hit the sand when pt stepped onto a dip in the sand.  Stepped on her R shoe lace the night before leaving the beach and fell to her L side onto the floor on November 15, 2020 evening while packing up. Pain has worsend since her beach trip. Denies loss of bowel control, sees a doctor for her bladder. Denies saddle anesthesia.    Patient Stated Goals Decrease pain.    Currently in Pain? Yes    Pain Score 10-Worst pain ever                                        PT Education - 01/21/21 1553     Education Details ther-ex    Person(s) Educated Patient    Methods Explanation;Demonstration;Tactile cues;Verbal cues    Comprehension Returned demonstration;Verbalized understanding            Objectives   Therapeutic exercise   Seated B scapular retraction 10x3 with 5 second holds   Seated transversus abdominis contraction 10x5 seconds for 3 sets  Seated glute max squeeze 2x5 seconds. Increased R LE symptoms.   Seated hip flexion isometrics 3x5 seconds each LE  Seated hip adduction isometrics with physioball 10x5 seconds for 2 sets   Increased R posterior hip pain, decreased R leg pain afterwards  Seated gentle manually resisted R upper trunk rotation isometrics to promote more neutral low back posture 10x5 seconds for 3 sets                 Improved exercise technique, movement at target joints, use of target muscles after mod verbal, visual, tactile cues.       Response to treatment Fair tolerance to today's session. Limited by pain.       Clinical impression  Fair tolerance to today's  session. Session ended early so as to not to irritate pt symptoms. Slight decrease in low back pain after session. R LE symptoms however, still present. Pt will benefit from continued skilled physical therapy services to decrease pain, improve strength and function.         PT  Short Term Goals - 11/26/20 1316       PT SHORT TERM GOAL #1   Title Pt will be independent with her initial HEP to decrease pain, improve strength, function, and ability to perform transfers more comfortably and with less difficulty.    Time 3    Period Weeks    Status New    Target Date 12/20/20               PT Long Term Goals - 12/27/20 1516       PT LONG TERM GOAL #1   Title Pt will have a decrease in low back pain to 6/10 or less at worst to promote ability to perform transfers, bed mobility as well as perform standing tasks more comfortably.    Baseline 20/10 low back pain at worst for the past 3 months (11/26/2020)    Time 8    Period Weeks    Status New    Target Date 01/24/21      PT LONG TERM GOAL #2   Title Patient will have a decrease in R LE pain to 6/10 or less at worst to promote ability to perform transfers, bed mobility as well as perform standing tasks more comfortably.    Baseline 20/10 R LE pain at worst to R foot for the past 3 months (11/26/2020)    Time 8    Period Weeks    Status New    Target Date 01/24/21      PT LONG TERM GOAL #3   Title Patient will improve her lumbar FOTO score by at least 10 points as a demonstration of improved function.    Baseline Lumbar FOTO: pt unable to complete (11/26/2020); 8/25: 40/49    Time 8    Period Weeks    Status New    Target Date 01/24/21      PT LONG TERM GOAL #4   Title Pt will improve bilateral hip extension and abduction strength by at least 1/2 MMT grade to promote ability to stand, ambulate and perform transfers more comfortably.    Baseline Hip extension 4-/5 R and L, hip abduction 4/5 R and L (11/26/2020)    Time 8     Period Weeks    Status New    Target Date 01/24/21                   Plan - 01/21/21 1602     Clinical Impression Statement Fair tolerance to today's session. Session ended early so as to not to irritate pt symptoms. Slight decrease in low back pain after session. R LE symptoms however, still present. Pt will benefit from continued skilled physical therapy services to decrease pain, improve strength and function.    Personal Factors and Comorbidities Comorbidity 3+;Age;Fitness;Past/Current Experience;Time since onset of injury/illness/exacerbation    Comorbidities Anxiety, asthma, depression, DM, hx of kidney stones, obesity, RA    Examination-Activity Limitations Bed Mobility;Stand;Lift;Locomotion Level;Bend;Transfers;Carry    Stability/Clinical Decision Making Evolving/Moderate complexity    Rehab Potential Fair    PT Frequency 2x / week    PT Duration 8 weeks    PT Treatment/Interventions Therapeutic exercise;Balance training;Neuromuscular re-education;Therapeutic activities;Patient/family education;Manual techniques;Dry needling;Aquatic Therapy;Electrical Stimulation;Iontophoresis 4mg /ml Dexamethasone    PT Next Visit Plan Posture, trunk,m hip strengthening, manual techniques, modalities PRN    PT Home Exercise Plan No changes this date    Consulted and Agree with Plan of Care Patient  Patient will benefit from skilled therapeutic intervention in order to improve the following deficits and impairments:  Pain, Improper body mechanics, Postural dysfunction, Decreased strength, Decreased balance  Visit Diagnosis: Radiculopathy, lumbosacral region  Chronic right-sided low back pain, unspecified whether sciatica present  Muscle weakness (generalized)     Problem List Patient Active Problem List   Diagnosis Date Noted   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) 01/03/2019    Chronic cough 11/19/2016   Morbid obesity (HCC) 09/26/2016   Left ovarian cyst 05/30/2016   S/P lumbar spinal fusion 12/13/2015   Aortic calcification (HCC) 07/18/2015   Controlled type 2 diabetes mellitus without complication (HCC) 05/16/2015   Thrombocytopenia (HCC) 05/16/2015   Recurrent major depressive disorder, in full remission (HCC) 05/16/2015   Essential (primary) hypertension 05/16/2015   Fatty infiltration of liver 03/15/2015   Aortic valve stenosis, nonrheumatic 01/18/2015   Sciatica of right side 01/09/2015   Type 2 diabetes mellitus (HCC) 11/10/2014   RLS (restless legs syndrome) 08/23/2014   Neuritis or radiculitis due to rupture of lumbar intervertebral disc 06/13/2014   Degeneration of intervertebral disc of lumbar region 06/13/2014   Lumbar radiculitis 06/13/2014   Arthritis 01/23/2014   Arthritis of knee, degenerative 11/15/2013   Depression 09/29/2013   Insomnia 09/29/2013   Apnea, sleep 08/29/2013   History of nephrolithiasis 08/29/2013   Abnormal presence of protein in urine 08/29/2013   Headache, migraine 08/29/2013   BP (high blood pressure) 08/29/2013   HLD (hyperlipidemia) 08/29/2013   Allergic rhinitis 08/29/2013   Intractable migraine without aura 02/18/2013    Loralyn Freshwater PT, DPT  01/21/2021, 4:26 PM  [place] [facility] [address] [place], [address] Phone: [phone]   Fax:  [phone]  Name: Jessica Cole MRN: [id number] Date of Birth: Oct 04, 1950

## 2021-01-23 ENCOUNTER — Ambulatory Visit: Payer: Medicare HMO

## 2021-01-25 ENCOUNTER — Ambulatory Visit: Payer: Self-pay | Admitting: Urology

## 2021-01-28 ENCOUNTER — Ambulatory Visit: Payer: Medicare HMO

## 2021-01-28 ENCOUNTER — Ambulatory Visit (INDEPENDENT_AMBULATORY_CARE_PROVIDER_SITE_OTHER): Payer: Medicare HMO | Admitting: Podiatry

## 2021-01-28 ENCOUNTER — Encounter: Payer: Self-pay | Admitting: Podiatry

## 2021-01-28 ENCOUNTER — Other Ambulatory Visit: Payer: Self-pay

## 2021-01-28 DIAGNOSIS — B351 Tinea unguium: Secondary | ICD-10-CM

## 2021-01-28 DIAGNOSIS — M7751 Other enthesopathy of right foot: Secondary | ICD-10-CM

## 2021-01-28 DIAGNOSIS — M79676 Pain in unspecified toe(s): Secondary | ICD-10-CM

## 2021-01-28 MED ORDER — DEXAMETHASONE SODIUM PHOSPHATE 120 MG/30ML IJ SOLN
2.0000 mg | Freq: Once | INTRAMUSCULAR | Status: AC
  Administered 2021-01-28: 2 mg via INTRA_ARTICULAR

## 2021-01-28 NOTE — Progress Notes (Signed)
She presents today chief concern of a bursitis of fifth met right.  Objective: Vital signs are stable alert oriented x3.  Toenails are long thick yellow dystrophic onychomycotic.  She also has pain on palpation fifth metatarsal base of the right foot.  Assessment: Bursitis of fifth met right.  Pain limb secondary to onychomycosis.  Plan: Debridement of toenails bilaterally.  I injected the bursitis today with dexamethasone and local anesthetic follow-up with her as needed

## 2021-01-29 ENCOUNTER — Ambulatory Visit: Payer: Medicare HMO

## 2021-01-29 DIAGNOSIS — M5417 Radiculopathy, lumbosacral region: Secondary | ICD-10-CM

## 2021-01-29 DIAGNOSIS — M545 Low back pain, unspecified: Secondary | ICD-10-CM

## 2021-01-29 NOTE — Therapy (Signed)
[facility] [address], [place], [address] Phone: [phone]   Fax:  [phone]  Physical Therapy Screen And Discharge Summary   Patient Details  Name: Jessica Cole MRN: [id number] Date of Birth: [birth year] Referring Provider (PT): Allene Dillon, NP   Encounter Date: 01/29/2021   PT End of Session - 01/29/21 1507     Visit Number --    Number of Visits 17    Date for PT Re-Evaluation 01/24/21    Authorization Type [employer]    Authorization Time Period 11/26/20-01/24/21    Progress Note Due on Visit 66    PT Start Time 1507    PT Stop Time 1523    PT Time Calculation (min) 16 min    Activity Tolerance Patient tolerated treatment well    Behavior During Therapy [facility] for tasks assessed/performed             Past Medical History:  Diagnosis Date   Anxiety    Asthma    seasonal with allergies   Colon polyps    Degenerative arthritis    Depression    Diabetes mellitus without complication ([place])    Type II   Environmental allergies    Family history of adverse reaction to anesthesia    sister- PONV   Fatty liver    Glaucoma    Headache(784.0)    HX  MIGRAINES   History of bronchitis    History of kidney stones    History of pneumonia    Hypertension    Hypothyroidism    Obesity    OSA on CPAP    Plantar fasciitis    right   PONV (postoperative nausea and vomiting)    Renal calculi    Renal calculi    Rheumatoid arthritis (HCC)    RLS (restless legs syndrome) 08/23/2014    Past Surgical History:  Procedure Laterality Date   ABDOMINAL HYSTERECTOMY     partial   APPENDECTOMY     Arthroscopic surgery, knee Left    BREAST BIOPSY Right    several   BREAST BIOPSY Right 03/11/2017   u/s bx neg   BUNIONECTOMY     LEFT    COLONOSCOPY W/ POLYPECTOMY     COLONOSCOPY WITH PROPOFOL N/A 08/14/2017   Procedure: COLONOSCOPY WITH PROPOFOL;  Surgeon: Lollie Sails, MD;   Location: [facility] ENDOSCOPY;  Service: Endoscopy;  Laterality: N/A;   EXTRACORPOREAL SHOCK WAVE LITHOTRIPSY Left 05/14/2017   Procedure: EXTRACORPOREAL SHOCK WAVE LITHOTRIPSY (ESWL);  Surgeon: Abbie Sons, MD;  Location: [facility] ORS;  Service: Urology;  Laterality: Left;   EXTRACORPOREAL SHOCK WAVE LITHOTRIPSY Left 06/14/2020   Procedure: EXTRACORPOREAL SHOCK WAVE LITHOTRIPSY (ESWL);  Surgeon: Billey Co, MD;  Location: [facility] ORS;  Service: Urology;  Laterality: Left;   FOOT SURGERY     RIGHT     KNEE ARTHROSCOPY W/ MENISCAL REPAIR Left    LASIK     MAXIMUM ACCESS (MAS)POSTERIOR LUMBAR INTERBODY FUSION (PLIF) 1 LEVEL N/A 04/25/2016   Procedure: LUMBAR FOUR-FIVE  MAXIMUM ACCESS (MAS) POSTERIOR LUMBAR INTERBODY FUSION (PLIF) with extension of instrumentation LUMBAR TWO-FIVE;  Surgeon: Eustace Moore, MD;  Location: [place];  Service: Neurosurgery;  Laterality: N/A;   MAXIMUM ACCESS (MAS)POSTERIOR LUMBAR INTERBODY FUSION (PLIF) 2 LEVEL N/A 12/13/2015   Procedure: Lumbar two-three - Lumbar three-four MAXIMUM ACCESS (MAS) POSTERIOR LUMBAR INTERBODY FUSION (PLIF)  ;  Surgeon: Eustace Moore, MD;  Location: [place] [facility]UR[facility] ORS;  Service: Neurosurgery;  Laterality: N/A;   REVERSE SHOULDER ARTHROPLASTY Left 03/01/2020   Procedure: REVERSE SHOULDER ARTHROPLASTY;  Surgeon: Corky Mull, MD;  Location: [facility] ORS;  Service: Orthopedics;  Laterality: Left;   SHOULDER SURGERY     RIGHT    TONSILLECTOMY      There were no vitals filed for this visit.   Subjective Assessment - 01/29/21 1509     Subjective Back is hurting some. Mostly down the back of R leg. 5.5/10 currently when walking. Went to Dr. Ronnald Ramp. MRI and has herniated disk. Going to do surgery to take care of the herniated disk. Has not gotten a call to set up a time yet. Does not know if he wants her to continue PT or not. Going up and down her back steps a little better.    Pertinent History Low back pain with RLE radiating symptoms. Pain in R posterior  hip with radiating down along R L5 dermatome to ankle. Pain appeared since after [place] 2021. Got a steroid injection 08/2020 which did not help. Sep 10, 2020, pt had 2 more steroid injections for her back which helped but pain returned July 2022. Went to the ER July 3 due to back pain. Pt was at the beach and fell while in waist deep water.  The day before, pt fell backwards and her back hit the sand when pt stepped onto a dip in the sand.  Stepped on her R shoe lace the night before leaving the beach and fell to her L side onto the floor on November 15, 2020 evening while packing up. Pain has worsend since her beach trip. Denies loss of bowel control, sees a doctor for her bladder. Denies saddle anesthesia.    Patient Stated Goals Decrease pain.    Currently in Pain? Yes    Pain Score 6                                         PT Education - 01/29/21 1535     Education Details ther-ex    Person(s) Educated Patient    Methods Explanation;Demonstration;Tactile cues;Verbal cues    Comprehension Returned demonstration;Verbalized understanding            Objectives      Clinical impression  Discussed overall current status with pt. Difficulty with decreasing back and R LE pain with very minimal progress. Pt seems to have reached a plateau in progress at the moment, limited by pain. Pt currently going to undergo surgery with Dr. Ronnald Ramp in the near future. Skilled physical therapy services discharged secondary to very minimal progress and pt undergoing surgery.        PT Short Term Goals - 01/29/21 1529       PT SHORT TERM GOAL #1   Title Pt will be independent with her initial HEP to decrease pain, improve strength, function, and ability to perform transfers more comfortably and with less difficulty.    Time 3    Period Weeks    Status Achieved    Target Date 12/20/20               PT Long Term Goals - 01/29/21 1517       PT LONG TERM GOAL #1    Title Pt will have a decrease in low back pain to 6/10 or less at worst to promote ability to perform transfers,  bed mobility as well as perform standing tasks more comfortably.    Baseline 20/10 low back pain at worst for the past 3 months (11/26/2020); 9/10 low back pain at most for the past 7 days (01/29/2021)    Time 8    Period Weeks    Status Not Met    Target Date 01/24/21      PT LONG TERM GOAL #2   Title Patient will have a decrease in R LE pain to 6/10 or less at worst to promote ability to perform transfers, bed mobility as well as perform standing tasks more comfortably.    Baseline 20/10 R LE pain at worst to R foot for the past 3 months (11/26/2020); 10/10 R LE pain at most for the past 7 days. (01/29/2021)    Time 8    Period Weeks    Status Not Met    Target Date 01/24/21      PT LONG TERM GOAL #3   Title Patient will improve her lumbar FOTO score by at least 10 points as a demonstration of improved function.    Baseline Lumbar FOTO: pt unable to complete (11/26/2020); 8/25: 40/49; 44/49 (01/29/2021)    Time 8    Period Weeks    Status Partially Met    Target Date 01/24/21      PT LONG TERM GOAL #4   Title Pt will improve bilateral hip extension and abduction strength by at least 1/2 MMT grade to promote ability to stand, ambulate and perform transfers more comfortably.    Baseline Hip extension 4-/5 R and L, hip abduction 4/5 R and L (11/26/2020)    Time 8    Period Weeks    Status On-going    Target Date 01/24/21                   Plan - 01/29/21 1528     Clinical Impression Statement Discussed overall current status with pt. Difficulty with decreasing back and R LE pain with very minimal progress. Pt seems to have reached a plateau in progress at the moment, limited by pain. Pt currently going to undergo surgery with Dr. Ronnald Ramp in the near future. Skilled physical therapy services discharged secondary to very minimal progress and pt undergoing surgery.     Personal Factors and Comorbidities Comorbidity 3+;Age;Fitness;Past/Current Experience;Time since onset of injury/illness/exacerbation    Comorbidities Anxiety, asthma, depression, DM, hx of kidney stones, obesity, RA    Examination-Activity Limitations Bed Mobility;Stand;Lift;Locomotion Level;Bend;Transfers;Carry    Stability/Clinical Decision Making Evolving/Moderate complexity    Clinical Decision Making Moderate    PT Treatment/Interventions Balance training;Neuromuscular re-education;Therapeutic activities;Patient/family education;Manual techniques;Therapeutic exercise    PT Next Visit Plan Continue with HEP    PT Home Exercise Plan No changes this date    Consulted and Agree with Plan of Care Patient             Patient will benefit from skilled therapeutic intervention in order to improve the following deficits and impairments:  Pain, Improper body mechanics, Postural dysfunction, Decreased strength, Decreased balance  Visit Diagnosis: Radiculopathy, lumbosacral region  Chronic right-sided low back pain, unspecified whether sciatica present     Problem List Patient Active Problem List   Diagnosis Date Noted   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 edema associated with type 2 diabetes mellitus ([place]) 01/03/2019   Chronic cough 11/19/2016   Morbid obesity ([place]) 09/26/2016   Left ovarian cyst 05/30/2016   S/P lumbar spinal fusion  12/13/2015   Aortic calcification ([place]) 07/18/2015   Controlled type 2 diabetes mellitus without complication ([place]) 16/02/9603   Thrombocytopenia ([place]) 05/16/2015   Recurrent major depressive disorder, in full remission ([place]) 05/16/2015   Essential (primary) hypertension 05/16/2015   Fatty infiltration of liver 03/15/2015   Aortic valve stenosis, nonrheumatic 01/18/2015   Sciatica of right side 01/09/2015   Type 2 diabetes mellitus ([place]) 11/10/2014   RLS (restless legs syndrome) 08/23/2014   Neuritis or  radiculitis due to rupture of lumbar intervertebral disc 06/13/2014   Degeneration of intervertebral disc of lumbar region 06/13/2014   Lumbar radiculitis 06/13/2014   Arthritis 01/23/2014   Arthritis of knee, degenerative 11/15/2013   Depression 09/29/2013   Insomnia 09/29/2013   Apnea, sleep 08/29/2013   History of nephrolithiasis 08/29/2013   Abnormal presence of protein in urine 08/29/2013   Headache, migraine 08/29/2013   BP (high blood pressure) 08/29/2013   HLD (hyperlipidemia) 08/29/2013   Allergic rhinitis 08/29/2013   Intractable migraine without aura 02/18/2013    Thank you for your referral.  Joneen Boers PT, DPT   01/29/2021, 3:39 PM  [facility] [address], [place], [address] Phone: [phone]   Fax:  [phone]  Name: Jessica Cole MRN: [id number] Date of Birth: [birth year]

## 2021-01-30 ENCOUNTER — Ambulatory Visit: Payer: Medicare HMO

## 2021-02-06 ENCOUNTER — Other Ambulatory Visit: Payer: Self-pay | Admitting: Neurological Surgery

## 2021-02-13 NOTE — Progress Notes (Signed)
Surgical Instructions    Your procedure is scheduled on 02/18/21.  Report to [facility] Main Entrance "A" at 10:50 A.M., then check in with the Admitting office.  Call this number if you have problems the morning of surgery:  [phone]   If you have any questions prior to your surgery date call [phone]: Open Monday-Friday 8am-4pm    Remember:  Do not eat after midnight the night before your surgery  You may drink clear liquids until 9:50 am the morning of your surgery.   Clear liquids allowed are: Water, Non-Citrus Juices (without pulp), Carbonated Beverages, Clear Tea, Black Coffee ONLY (NO MILK, CREAM OR POWDERED CREAMER of any kind), and Gatorade    Take these medicines the morning of surgery with A SIP OF WATER: DULoxetine (CYMBALTA) levothyroxine (SYNTHROID, LEVOTHROID) rOPINIRole (REQUIP) solifenacin (VESICARE)  AS NEEDED: albuterol (PROVENTIL HFA;VENTOLIN HFA) - bring inhaler day of surgery azelastine (ASTELIN) - nasal spray fluticasone (FLONASE) HYDROcodone-acetaminophen (NORCO/VICODIN)   As of today, STOP taking any Aspirin (unless otherwise instructed by your surgeon) , Meloxicam, Aleve, Naproxen, Ibuprofen, Motrin, Advil, Goody's, BC's, all herbal medications, fish oil, and all vitamins.  WHAT DO I DO ABOUT MY DIABETES MEDICATION?   Do not take oral diabetes medicines (pills) the morning of surgery. (Metformin or Jardiance)   DO NOT take Jardiance the day before surgery or the day of surgery.    HOW TO MANAGE YOUR DIABETES BEFORE AND AFTER SURGERY  Why is it important to control my blood sugar before and after surgery? Improving blood sugar levels before and after surgery helps healing and can limit problems. A way of improving blood sugar control is eating a healthy diet by:  Eating less sugar and carbohydrates  Increasing activity/exercise  Talking with your doctor about reaching your blood sugar goals High blood sugars (greater than 180 mg/dL)  can raise your risk of infections and slow your recovery, so you will need to focus on controlling your diabetes during the weeks before surgery. Make sure that the doctor who takes care of your diabetes knows about your planned surgery including the date and location.  How do I manage my blood sugar before surgery? Check your blood sugar at least 4 times a day, starting 2 days before surgery, to make sure that the level is not too high or low.  Check your blood sugar the morning of your surgery when you wake up and every 2 hours until you get to the Short Stay unit.  If your blood sugar is less than 70 mg/dL, you will need to treat for low blood sugar: Do not take insulin. Treat a low blood sugar (less than 70 mg/dL) with  cup of clear juice (cranberry or apple), 4 glucose tablets, OR glucose gel. Recheck blood sugar in 15 minutes after treatment (to make sure it is greater than 70 mg/dL). If your blood sugar is not greater than 70 mg/dL on recheck, call [phone] for further instructions. Report your blood sugar to the short stay nurse when you get to Short Stay.  If you are admitted to the hospital after surgery: Your blood sugar will be checked by the staff and you will probably be given insulin after surgery (instead of oral diabetes medicines) to make sure you have good blood sugar levels. The goal for blood sugar control after surgery is 80-180 mg/dL.      After your COVID test   You are not required to quarantine however you are required to wear a well-fitting  mask when you are out and around people not in your household.  If your mask becomes wet or soiled, replace with a new one.  Wash your hands often with soap and water for 20 seconds or clean your hands with an alcohol-based hand sanitizer that contains at least 60% alcohol.  Do not share personal items.  Notify your provider: if you are in close contact with someone who has COVID  or if you develop a fever of 100.4 or  greater, sneezing, cough, sore throat, shortness of breath or body aches.             Do not wear jewelry or makeup Do not wear lotions, powders, perfumes or deodorant. Do not shave 48 hours prior to surgery.  Do not bring valuables to the hospital. DO Not wear nail polish, gel polish, artificial nails, or any other type of covering on natural nails including finger and toenails. If patients have artificial nails, gel coating, etc. that need to be removed by a nail salon, please have this removed prior to surgery or surgery may need to be canceled/delayed if the surgeon/ anesthesia feels like the patient is unable to be adequately monitored.             [facility] is not responsible for any belongings or valuables.  Do NOT Smoke (Tobacco/Vaping)  24 hours prior to your procedure  If you use a CPAP at night, you may bring your mask for your overnight stay.   Contacts, glasses, hearing aids, dentures or partials may not be worn into surgery, please bring cases for these belongings   For patients admitted to the hospital, discharge time will be determined by your treatment team.   Patients discharged the day of surgery will not be allowed to drive home, and someone needs to stay with them for 24 hours.  NO VISITORS WILL BE ALLOWED IN PRE-OP WHERE PATIENTS ARE PREPPED FOR SURGERY.  ONLY 1 SUPPORT PERSON MAY BE PRESENT IN THE WAITING ROOM WHILE YOU ARE IN SURGERY.  IF YOU ARE TO BE ADMITTED, ONCE YOU ARE IN YOUR ROOM YOU WILL BE ALLOWED TWO (2) VISITORS. 1 (ONE) VISITOR MAY STAY OVERNIGHT BUT MUST ARRIVE TO THE ROOM BY 8pm.  Minor children may have two parents present. Special consideration for safety and communication needs will be reviewed on a case by case basis.  Special instructions:    Oral Hygiene is also important to reduce your risk of infection.  Remember - BRUSH YOUR TEETH THE MORNING OF SURGERY WITH YOUR REGULAR TOOTHPASTE   [facility]- Preparing For Surgery  Before surgery,  you can play an important role. Because skin is not sterile, your skin needs to be as free of germs as possible. You can reduce the number of germs on your skin by washing with CHG (chlorahexidine gluconate) Soap before surgery.  CHG is an antiseptic cleaner which kills germs and bonds with the skin to continue killing germs even after washing.     Please do not use if you have an allergy to CHG or antibacterial soaps. If your skin becomes reddened/irritated stop using the CHG.  Do not shave (including legs and underarms) for at least 48 hours prior to first CHG shower. It is OK to shave your face.  Please follow these instructions carefully.     Shower the NIGHT BEFORE SURGERY and the MORNING OF SURGERY with CHG Soap.   If you chose to wash your hair, wash your hair first as  usual with your normal shampoo. After you shampoo, rinse your hair and body thoroughly to remove the shampoo.  Then [employer] and genitals (private parts) with your normal soap and rinse thoroughly to remove soap.  After that Use CHG Soap as you would any other liquid soap. You can apply CHG directly to the skin and wash gently with a scrungie or a clean washcloth.   Apply the CHG Soap to your body ONLY FROM THE NECK DOWN.  Do not use on open wounds or open sores. Avoid contact with your eyes, ears, mouth and genitals (private parts). Wash Face and genitals (private parts)  with your normal soap.   Wash thoroughly, paying special attention to the area where your surgery will be performed.  Thoroughly rinse your body with warm water from the neck down.  DO NOT shower/wash with your normal soap after using and rinsing off the CHG Soap.  Pat yourself dry with a CLEAN TOWEL.  Wear CLEAN PAJAMAS to bed the night before surgery  Place CLEAN SHEETS on your bed the night before your surgery  DO NOT SLEEP WITH PETS.   Day of Surgery:  Take a shower with CHG soap. Wear Clean/Comfortable clothing the morning of  surgery Do not apply any deodorants/lotions.   Remember to brush your teeth WITH YOUR REGULAR TOOTHPASTE.   Please read over the following fact sheets that you were given.

## 2021-02-14 ENCOUNTER — Encounter (HOSPITAL_COMMUNITY)
Admission: RE | Admit: 2021-02-14 | Discharge: 2021-02-14 | Disposition: A | Discharge status: Home / Self Care | Payer: Medicare HMO | Source: Ambulatory Visit | Attending: Neurological Surgery | Admitting: Neurological Surgery

## 2021-02-14 ENCOUNTER — Encounter (HOSPITAL_COMMUNITY): Payer: Self-pay

## 2021-02-14 ENCOUNTER — Other Ambulatory Visit: Payer: Self-pay

## 2021-02-14 DIAGNOSIS — Z20822 Contact with and (suspected) exposure to covid-19: Secondary | ICD-10-CM | POA: Insufficient documentation

## 2021-02-14 DIAGNOSIS — Z01812 Encounter for preprocedural laboratory examination: Secondary | ICD-10-CM | POA: Insufficient documentation

## 2021-02-14 DIAGNOSIS — Z22322 Carrier or suspected carrier of Methicillin resistant Staphylococcus aureus: Secondary | ICD-10-CM

## 2021-02-14 HISTORY — DX: Carrier or suspected carrier of methicillin resistant Staphylococcus aureus: Z22.322

## 2021-02-14 LAB — BASIC METABOLIC PANEL
Anion gap: 8 (ref 5–15)
BUN: 16 mg/dL (ref 8–23)
CO2: 26 mmol/L (ref 22–32)
Calcium: 9.9 mg/dL (ref 8.9–10.3)
Chloride: 108 mmol/L (ref 98–111)
Creatinine, Ser: 0.75 mg/dL (ref 0.44–1.00)
GFR, Estimated: >60 mL/min (ref >60)
Glucose, Bld: 147 mg/dL — ABNORMAL HIGH (ref 70–99)
Potassium: 4.5 mmol/L (ref 3.5–5.1)
Sodium: 142 mmol/L (ref 135–145)

## 2021-02-14 LAB — SARS CORONAVIRUS 2 (TAT 6-24 HRS): SARS Coronavirus 2: NEGATIVE

## 2021-02-14 LAB — SURGICAL PCR SCREEN
MRSA, PCR: POSITIVE — AB
Staphylococcus aureus: POSITIVE — AB

## 2021-02-14 LAB — PROTIME-INR
INR: 1 (ref 0.8–1.2)
Prothrombin Time: 13.6 s (ref 11.4–15.2)

## 2021-02-14 LAB — GLUCOSE, CAPILLARY: Glucose-Capillary: 155 mg/dL — ABNORMAL HIGH (ref 70–99)

## 2021-02-14 NOTE — Progress Notes (Signed)
PCP - Dr. Curtis Sites III Cardiologist - denies  PPM/ICD - denies   Chest x-ray - 03/02/20 EKG - 03/02/20 Stress Test - 04/24/17 ECHO - 02/27/16 Cardiac Cath - denies  Sleep Study - per pt, "about 15 years ago" CPAP - yes  DM- Type 2 Fasting Blood Sugar - 120-140 Checks Blood Sugar 1-2 times a day  Blood Thinner Instructions: n/a Aspirin Instructions: pt instructed to contact surgeon's office for instructions  ERAS Protcol - NPO   COVID TEST- 02/14/21 at PAT appt, results pending   Anesthesia review: No  Patient denies shortness of breath, fever, cough and chest pain at PAT appointment   All instructions explained to the patient, with a verbal understanding of the material. Patient agrees to go over the instructions while at home for a better understanding. Patient also instructed to wear a mask in public after being tested for COVID-19. The opportunity to ask questions was provided.

## 2021-02-14 NOTE — Progress Notes (Signed)
Pt's MRSA swab came back positive at PAT appt. Erie Noe, Dr. Yetta Barre' surgery scheduler notified.

## 2021-02-16 ENCOUNTER — Emergency Department
Admission: EM | Admit: 2021-02-16 | Discharge: 2021-02-16 | Disposition: A | Discharge status: Home / Self Care | Payer: Medicare HMO | Attending: Emergency Medicine | Admitting: Emergency Medicine

## 2021-02-16 ENCOUNTER — Emergency Department: Payer: Medicare HMO

## 2021-02-16 ENCOUNTER — Other Ambulatory Visit: Payer: Self-pay

## 2021-02-16 ENCOUNTER — Encounter: Payer: Self-pay | Admitting: Emergency Medicine

## 2021-02-16 DIAGNOSIS — Z96612 Presence of left artificial shoulder joint: Secondary | ICD-10-CM | POA: Diagnosis not present

## 2021-02-16 DIAGNOSIS — I10 Essential (primary) hypertension: Secondary | ICD-10-CM | POA: Insufficient documentation

## 2021-02-16 DIAGNOSIS — Z79899 Other long term (current) drug therapy: Secondary | ICD-10-CM | POA: Diagnosis not present

## 2021-02-16 DIAGNOSIS — E119 Type 2 diabetes mellitus without complications: Secondary | ICD-10-CM | POA: Diagnosis not present

## 2021-02-16 DIAGNOSIS — Z7984 Long term (current) use of oral hypoglycemic drugs: Secondary | ICD-10-CM | POA: Insufficient documentation

## 2021-02-16 DIAGNOSIS — S62112A Displaced fracture of triquetrum [cuneiform] bone, left wrist, initial encounter for closed fracture: Secondary | ICD-10-CM

## 2021-02-16 DIAGNOSIS — W0110XA Fall on same level from slipping, tripping and stumbling with subsequent striking against unspecified object, initial encounter: Secondary | ICD-10-CM | POA: Diagnosis not present

## 2021-02-16 DIAGNOSIS — S62111A Displaced fracture of triquetrum [cuneiform] bone, right wrist, initial encounter for closed fracture: Secondary | ICD-10-CM | POA: Diagnosis not present

## 2021-02-16 DIAGNOSIS — S80211A Abrasion, right knee, initial encounter: Secondary | ICD-10-CM | POA: Insufficient documentation

## 2021-02-16 DIAGNOSIS — M25531 Pain in right wrist: Secondary | ICD-10-CM | POA: Diagnosis not present

## 2021-02-16 DIAGNOSIS — S80212A Abrasion, left knee, initial encounter: Secondary | ICD-10-CM | POA: Diagnosis not present

## 2021-02-16 DIAGNOSIS — E039 Hypothyroidism, unspecified: Secondary | ICD-10-CM | POA: Insufficient documentation

## 2021-02-16 DIAGNOSIS — S6991XA Unspecified injury of right wrist, hand and finger(s), initial encounter: Secondary | ICD-10-CM | POA: Diagnosis present

## 2021-02-16 DIAGNOSIS — Z7982 Long term (current) use of aspirin: Secondary | ICD-10-CM | POA: Diagnosis not present

## 2021-02-16 DIAGNOSIS — J45909 Unspecified asthma, uncomplicated: Secondary | ICD-10-CM | POA: Diagnosis not present

## 2021-02-16 MED ORDER — OXYCODONE-ACETAMINOPHEN 5-325 MG PO TABS: 1.0000 | ORAL_TABLET | Freq: Four times a day (QID) | ORAL | 0 refills | Status: AC | PRN

## 2021-02-16 MED ORDER — LIDOCAINE 5 % EX PTCH
1.0000 | MEDICATED_PATCH | CUTANEOUS | Status: DC
  Administered 2021-02-16: 1 via TRANSDERMAL
  Filled 2021-02-16: qty 1

## 2021-02-16 NOTE — ED Provider Notes (Signed)
[facility] Emergency Department Provider Note   ____________________________________________   Event Date/Time   First MD Initiated Contact with Patient 02/16/21 0813     (approximate)  I have reviewed the triage vital signs and the nursing notes.   HISTORY  Chief Complaint Fall and Hand Injury    HPI Jessica Cole is a 70 y.o. female patient complaining of right wrist and hand pain secondary to a trip and fall which occurred last night.  Patient states able to ambulate with mild discomfort sustaining abrasions to her knees.  Patient states decreased range of motion with extension and flexion of the wrist.  Patient state upon awakening this morning she noticed bruising on the dorsal aspect of the right wrist.  Denies loss of sensation.  Rates pain as a 10/10.  Patient states she took a pain medication prior to arrival.  Unsure if medication name at this time.  Patient is right-hand dominant.         Past Medical History:  Diagnosis Date   Anxiety    Asthma    seasonal with allergies   Colon polyps    Degenerative arthritis    Depression    Diabetes mellitus without complication (HCC)    Type II   Environmental allergies    Family history of adverse reaction to anesthesia    sister- PONV   Fatty liver    Glaucoma    Headache(784.0)    HX  MIGRAINES   History of bronchitis    History of kidney stones    History of pneumonia    Hypertension    Hypothyroidism    Obesity    OSA on CPAP    Plantar fasciitis    right   PONV (postoperative nausea and vomiting)    pt has never had any anesthesia complications, no post-op nausea or vomiting   Renal calculi    Renal calculi    Rheumatoid arthritis (HCC)    RLS (restless legs syndrome) 08/23/2014    Patient Active Problem List   Diagnosis Date Noted   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) 01/03/2019    Chronic cough 11/19/2016   Morbid obesity (HCC) 09/26/2016   Left ovarian cyst 05/30/2016   S/P lumbar spinal fusion 12/13/2015   Aortic calcification (HCC) 07/18/2015   Controlled type 2 diabetes mellitus without complication (HCC) 05/16/2015   Thrombocytopenia (HCC) 05/16/2015   Recurrent major depressive disorder, in full remission (HCC) 05/16/2015   Essential (primary) hypertension 05/16/2015   Fatty infiltration of liver 03/15/2015   Aortic valve stenosis, nonrheumatic 01/18/2015   Sciatica of right side 01/09/2015   Type 2 diabetes mellitus (HCC) 11/10/2014   RLS (restless legs syndrome) 08/23/2014   Neuritis or radiculitis due to rupture of lumbar intervertebral disc 06/13/2014   Degeneration of intervertebral disc of lumbar region 06/13/2014   Lumbar radiculitis 06/13/2014   Arthritis 01/23/2014   Arthritis of knee, degenerative 11/15/2013   Depression 09/29/2013   Insomnia 09/29/2013   Apnea, sleep 08/29/2013   History of nephrolithiasis 08/29/2013   Abnormal presence of protein in urine 08/29/2013   Headache, migraine 08/29/2013   BP (high blood pressure) 08/29/2013   HLD (hyperlipidemia) 08/29/2013   Allergic rhinitis 08/29/2013   Intractable migraine without aura 02/18/2013    Past Surgical History:  Procedure Laterality Date   ABDOMINAL HYSTERECTOMY     partial   APPENDECTOMY     Arthroscopic surgery,  knee Left    BREAST BIOPSY Right    several   BREAST BIOPSY Right 03/11/2017   u/s bx neg   BUNIONECTOMY     LEFT    COLONOSCOPY W/ POLYPECTOMY     COLONOSCOPY WITH PROPOFOL N/A 08/14/2017   Procedure: COLONOSCOPY WITH PROPOFOL;  Surgeon: Christena Deem, MD;  Location: [facility] ENDOSCOPY;  Service: Endoscopy;  Laterality: N/A;   EXTRACORPOREAL SHOCK WAVE LITHOTRIPSY Left 05/14/2017   Procedure: EXTRACORPOREAL SHOCK WAVE LITHOTRIPSY (ESWL);  Surgeon: Riki Altes, MD;  Location: [facility] ORS;  Service: Urology;  Laterality: Left;   EXTRACORPOREAL SHOCK WAVE  LITHOTRIPSY Left 06/14/2020   Procedure: EXTRACORPOREAL SHOCK WAVE LITHOTRIPSY (ESWL);  Surgeon: Sondra Come, MD;  Location: [facility] ORS;  Service: Urology;  Laterality: Left;   FOOT SURGERY     RIGHT     KNEE ARTHROSCOPY W/ MENISCAL REPAIR Left    LASIK     MAXIMUM ACCESS (MAS)POSTERIOR LUMBAR INTERBODY FUSION (PLIF) 1 LEVEL N/A 04/25/2016   Procedure: LUMBAR FOUR-FIVE  MAXIMUM ACCESS (MAS) POSTERIOR LUMBAR INTERBODY FUSION (PLIF) with extension of instrumentation LUMBAR TWO-FIVE;  Surgeon: Tia Alert, MD;  Location: [facility] OR;  Service: Neurosurgery;  Laterality: N/A;   MAXIMUM ACCESS (MAS)POSTERIOR LUMBAR INTERBODY FUSION (PLIF) 2 LEVEL N/A 12/13/2015   Procedure: Lumbar two-three - Lumbar three-four MAXIMUM ACCESS (MAS) POSTERIOR LUMBAR INTERBODY FUSION (PLIF)  ;  Surgeon: Tia Alert, MD;  Location: [facility] NEURO ORS;  Service: Neurosurgery;  Laterality: N/A;   REVERSE SHOULDER ARTHROPLASTY Left 03/01/2020   Procedure: REVERSE SHOULDER ARTHROPLASTY;  Surgeon: Christena Flake, MD;  Location: [facility] ORS;  Service: Orthopedics;  Laterality: Left;   SHOULDER SURGERY     RIGHT    TONSILLECTOMY      Prior to Admission medications   Medication Sig Start Date End Date Taking? Authorizing Provider  oxyCODONE-acetaminophen (PERCOCET) 5-325 MG tablet Take 1 tablet by mouth every 6 (six) hours as needed for up to 5 days for severe pain. 02/16/21 02/21/21 Yes Joni Reining, PA-C  albuterol (PROVENTIL HFA;VENTOLIN HFA) 108 (90 Base) MCG/ACT inhaler Inhale 2 puffs into the lungs every 6 (six) hours as needed for shortness of breath or wheezing.    [provider]  aspirin 81 MG EC tablet Take 81 mg by mouth in the morning, at noon, and at bedtime.    [provider]  azelastine (ASTELIN) 0.1 % nasal spray Place 1 spray into both nostrils daily as needed for rhinitis or allergies. 11/23/15   [provider]  Cholecalciferol (VITAMIN D) 50 MCG (2000 UT) CAPS Take 2,000 Units by mouth  daily.    [provider]  cyclobenzaprine (FLEXERIL) 5 MG tablet Take 1 tablet (5 mg total) by mouth 3 (three) times daily as needed for muscle spasms. Patient not taking: No sig reported 11/04/20   Lorre Munroe, NP  DULoxetine (CYMBALTA) 60 MG capsule Take 60 mg by mouth daily.    [provider]  empagliflozin (JARDIANCE) 10 MG TABS tablet Take 10 mg by mouth every other day.    [provider]  estradiol (ESTRACE) 0.1 MG/GM vaginal cream Place 1 Applicatorful vaginally daily as needed (vaginal irritation). 05/29/20   [provider]  fluticasone (FLONASE) 50 MCG/ACT nasal spray Place 2 sprays into both nostrils daily as needed for allergies. 04/12/14   [provider]  folic acid (FOLVITE) 1 MG tablet Take 1 mg by mouth daily.    [provider]  gabapentin (NEURONTIN) 800  MG tablet TAKE 1 TABLET BY MOUTH TWICE A DAY Patient taking differently: Take 800 mg by mouth at bedtime. 09/28/19   Glean Salvo, NP  HYDROcodone-acetaminophen (NORCO/VICODIN) 5-325 MG tablet Take 1 tablet by mouth every 8 (eight) hours as needed for moderate pain. 11/04/20 11/04/21  Lorre Munroe, NP  latanoprost (XALATAN) 0.005 % ophthalmic solution Place 1 drop into both eyes at bedtime.     [provider]  levothyroxine (SYNTHROID, LEVOTHROID) 112 MCG tablet Take 112 mcg by mouth daily before breakfast.  11/07/15   [provider]  meloxicam (MOBIC) 7.5 MG tablet Take 7.5 mg by mouth daily.    [provider]  metFORMIN (GLUCOPHAGE) 1000 MG tablet Take 1,000 mg by mouth daily.    [provider]  methotrexate (RHEUMATREX) 2.5 MG tablet Take 15 mg by mouth every Saturday. Caution:Chemotherapy. Protect from light.    [provider]  metoprolol succinate (TOPROL-XL) 50 MG 24 hr tablet TAKE 1 TABLET BY MOUTH DAILY. TAKE WITH OR IMMEDIATELY FOLLOWING A MEAL. Patient not taking: No sig reported 08/08/20   Glean Salvo, NP  ONE  TOUCH ULTRA TEST test strip USE 2 (TWO) TIMES DAILY USE AS INSTRUCTED. 11/13/17   [provider]  OVER THE COUNTER MEDICATION Take 2 capsules by mouth daily. Brain Support    [provider]  pravastatin (PRAVACHOL) 20 MG tablet Take 20 mg by mouth at bedtime.    [provider]  rOPINIRole (REQUIP) 4 MG tablet Take 1 tablet (4 mg total) by mouth 2 (two) times daily. 05/09/19   Glean Salvo, NP  solifenacin (VESICARE) 5 MG tablet Take 10 mg by mouth daily. 05/29/20   [provider]  tamsulosin (FLOMAX) 0.4 MG CAPS capsule TAKE 1 CAPSULE BY MOUTH EVERY DAY Patient not taking: No sig reported 11/06/20   Stoioff, Verna Czech, MD  telmisartan (MICARDIS) 80 MG tablet Take 80 mg by mouth daily. 01/02/19   [provider]  traMADol (ULTRAM) 50 MG tablet TAKE 1/2-1 TABLET BY MOUTH 2 TIMES A DAY AS NEEDED Patient not taking: No sig reported 10/08/20   York Spaniel, MD    Allergies Diazepam and Dexamethasone  Family History  Problem Relation Age of Onset   Lung cancer Mother    Congestive Heart Failure Father    Depression Sister    Rheum arthritis Sister    Headache Maternal Grandfather    Migraines Maternal Grandfather    Headache Maternal Uncle    Diabetes Other    Heart disease Other    Hypertension Other    Breast cancer Neg Hx    Bladder Cancer Neg Hx    Kidney cancer Neg Hx     Social History Social History   Tobacco Use   Smoking status: Never   Smokeless tobacco: Never  Vaping Use   Vaping Use: Never used  Substance Use Topics   Alcohol use: No   Drug use: No    Review of Systems  Constitutional: No fever/chills Eyes: No visual changes. ENT: No sore throat. Cardiovascular: Denies chest pain. Respiratory: Denies shortness of breath. Gastrointestinal: No abdominal pain.  No nausea, no vomiting.  No diarrhea.  No constipation. Genitourinary: Negative for dysuria. Musculoskeletal: Negative for back pain. Skin: Negative for  rash. Neurological: Negative for headaches, focal weakness or numbness. Psychiatric:  Endocrine: Diabetes, hyperlipidemia, and hypertension. Allergic/Immunilogical: Diazepam and dexamethasone ____________________________________________   PHYSICAL EXAM:  VITAL SIGNS: ED Triage Vitals  Enc Vitals Group  BP 02/16/21 0742 (!) 178/85     Pulse Rate 02/16/21 0742 63     Resp 02/16/21 0742 20     Temp 02/16/21 0742 (!) 97.5 F (36.4 C)     Temp Source 02/16/21 0742 Oral     SpO2 02/16/21 0742 96 %     Weight 02/16/21 0742 186 lb (84.4 kg)     Height 02/16/21 0742 4\' 11"  (1.499 m)     Head Circumference --      Peak Flow --      Pain Score 02/16/21 0750 10     Pain Loc --      Pain Edu? --      Excl. in GC? --     Constitutional: Alert and oriented. Well appearing and in mild distress. Eyes: Conjunctivae are normal. PERRL. EOMI. Head: Atraumatic. Nose: No congestion/rhinnorhea. Mouth/Throat: Mucous membranes are moist.  Oropharynx non-erythematous. Neck: No stridor.  No cervical spine tenderness to palpation. Hematological/Lymphatic/Immunilogical: No cervical lymphadenopathy. Cardiovascular: Normal rate, regular rhythm. Grossly normal heart sounds.  Good peripheral circulation.  Elevated blood pressure Respiratory: Normal respiratory effort.  No retractions. Lungs CTAB. Gastrointestinal: Soft and nontender. No distention. No abdominal bruits. No CVA tenderness. Genitourinary: Deferred Musculoskeletal: No lower extremity tenderness nor edema.  No joint effusions. Neurologic:  Normal speech and language. No gross focal neurologic deficits are appreciated. No gait instability. Skin:  Skin is warm, dry and intact.  Bilateral knee abrasions.  Ecchymosis volar aspect of right wrist.   Psychiatric: Mood and affect are normal. Speech and behavior are normal.  ____________________________________________   LABS (all labs ordered are listed, but only abnormal results are  displayed)  Labs Reviewed - No data to display ____________________________________________  EKG  ____________________________________________  RADIOLOGY I, 02/18/21, personally viewed and evaluated these images (plain radiographs) as part of my medical decision making, as well as reviewing the written report by the radiologist.  ED MD interpretation:  Triquetral fracture right wrist  Official radiology report(s): DG Wrist Complete Right  Result Date: 02/16/2021 CLINICAL DATA:  Pain, ecchymosis secondary to fall EXAM: RIGHT WRIST - COMPLETE 3+ VIEW COMPARISON:  Hand MRI 09/17/2020 FINDINGS: There is an ossific fragment along the dorsum of the hand seen on the lateral projection suspicious for acute triquetral fracture. No other acute fracture is seen. Bony alignment is normal. There is joint space narrowing with probable erosive change involving the middle finger MCP joint. There is irregularity of the triscaphe joint. The other joint spaces are overall preserved. There is soft tissue swelling over the dorsum of the hand. IMPRESSION: Ossific fragment along the dorsum of the wrist consistent with acute triquetral fracture with associated soft tissue swelling. Electronically Signed   By: 09/19/2020 M.D.   On: 02/16/2021 08:38    ____________________________________________   PROCEDURES  Procedure(s) performed (including Critical Care):  Procedures   ____________________________________________   INITIAL IMPRESSION / ASSESSMENT AND PLAN / ED COURSE  As part of my medical decision making, I reviewed the following data within the electronic MEDICAL RECORD NUMBER         Patient presents with right wrist pain secondary to fall.  Discussed x-ray findings with patient consistent with Triquetral fracture.  Patient placed in a volar splint and given discharge care instruction.  Patient to follow-up in 2 days by calling orthopedic department to schedule appointment.  Patient advised  to just fax her narcotic pain medication.      ____________________________________________   FINAL CLINICAL IMPRESSION(S) /  ED DIAGNOSES  Final diagnoses:  Triquetral chip fracture, left, closed, initial encounter     ED Discharge Orders          Ordered    oxyCODONE-acetaminophen (PERCOCET) 5-325 MG tablet  Every 6 hours PRN        02/16/21 0858             Note:  This document was prepared using Dragon voice recognition software and may include unintentional dictation errors.    Joni Reining, PA-C 02/16/21 1517    Sharyn Creamer, MD 02/16/21 [phone]

## 2021-02-16 NOTE — ED Triage Notes (Signed)
Pt reports was leaving the movies yesterday and fell catching herself with her hands and knees. Pt reports abrasions to knees but her right hand/wrist is what hurts then most. Swelling and bruising noted.

## 2021-02-16 NOTE — ED Notes (Signed)
OCL volar splint applied to R hand/wrist by [employer] Mechanic, EMT and this RN. Pt tolerated well.

## 2021-02-16 NOTE — ED Notes (Signed)
Portable Xray at bedside.

## 2021-02-16 NOTE — Discharge Instructions (Addendum)
Read and follow discharge care instruction.  Wear splint till evaluation by orthopedics.

## 2021-02-26 ENCOUNTER — Other Ambulatory Visit: Payer: Self-pay

## 2021-02-26 ENCOUNTER — Other Ambulatory Visit
Admission: RE | Admit: 2021-02-26 | Discharge: 2021-02-26 | Disposition: A | Discharge status: Home / Self Care | Payer: Medicare HMO | Source: Ambulatory Visit | Attending: Neurological Surgery | Admitting: Neurological Surgery

## 2021-02-26 DIAGNOSIS — Z01812 Encounter for preprocedural laboratory examination: Secondary | ICD-10-CM | POA: Diagnosis present

## 2021-02-26 DIAGNOSIS — Z20822 Contact with and (suspected) exposure to covid-19: Secondary | ICD-10-CM | POA: Diagnosis not present

## 2021-02-26 LAB — SARS CORONAVIRUS 2 (TAT 6-24 HRS): SARS Coronavirus 2: NEGATIVE

## 2021-02-28 NOTE — Anesthesia Preprocedure Evaluation (Addendum)
Anesthesia Evaluation  Patient identified by MRN, date of birth, ID band Patient awake    Reviewed: Allergy & Precautions, Patient's Chart, lab work & pertinent test results  Airway Mallampati: II  TM Distance: >3 FB Neck ROM: Full    Dental no notable dental hx. (+) Teeth Intact, Dental Advisory Given   Pulmonary asthma , sleep apnea and Continuous Positive Airway Pressure Ventilation ,    Pulmonary exam normal breath sounds clear to auscultation       Cardiovascular hypertension, Normal cardiovascular exam Rhythm:Regular Rate:Normal     Neuro/Psych    GI/Hepatic negative GI ROS, Neg liver ROS,   Endo/Other  diabetes, Type 2Hypothyroidism   Renal/GU Lab Results      Component                Value               Date                      CREATININE               0.75                02/14/2021                BUN                      16                  02/14/2021                NA                       142                 02/14/2021                K                        4.5                 02/14/2021                CL                       108                 02/14/2021                CO2                      26                  02/14/2021                Musculoskeletal  (+) Arthritis , Rheumatoid disorders,    Abdominal (+) + obese (BMI 38.98),   Peds  Hematology Lab Results      Component                Value               Date                      WBC  7.7                 03/02/2020                HGB                      12.1                03/02/2020                HCT                      35.5 (L)            03/02/2020                MCV                      91.5                03/02/2020                PLT                      134 (L)             03/02/2020              Anesthesia Other Findings All Diazepam, Dexamethasone  Reproductive/Obstetrics                             Anesthesia Physical Anesthesia Plan  ASA: 3  Anesthesia Plan: General   Post-op Pain Management:    Induction: Intravenous  PONV Risk Score and Plan: 4 or greater and Treatment may vary due to age or medical condition and Ondansetron  Airway Management Planned: Oral ETT  Additional Equipment: None  Intra-op Plan:   Post-operative Plan: Extubation in OR  Informed Consent: I have reviewed the patients History and Physical, chart, labs and discussed the procedure including the risks, benefits and alternatives for the proposed anesthesia with the patient or authorized representative who has indicated his/her understanding and acceptance.     Dental advisory given  Plan Discussed with: CRNA  Anesthesia Plan Comments: (Pt took extra dose of Gabapentin prior to arrival. A little sleepy Hemodynamically  Stable. After discussion w surgeon will proceed w surgery.)      Anesthesia Quick Evaluation

## 2021-03-01 ENCOUNTER — Ambulatory Visit (HOSPITAL_COMMUNITY): Payer: Medicare HMO | Admitting: Anesthesiology

## 2021-03-01 ENCOUNTER — Observation Stay (HOSPITAL_COMMUNITY)
Admission: RE | Admit: 2021-03-01 | Discharge: 2021-03-01 | Disposition: A | Discharge status: Home / Self Care | Payer: Medicare HMO | Attending: Neurological Surgery | Admitting: Neurological Surgery

## 2021-03-01 ENCOUNTER — Encounter (HOSPITAL_COMMUNITY)
Admission: RE | Disposition: A | Discharge status: Home / Self Care | Payer: Self-pay | Source: Home / Self Care | Attending: Neurological Surgery

## 2021-03-01 ENCOUNTER — Other Ambulatory Visit: Payer: Self-pay

## 2021-03-01 ENCOUNTER — Encounter (HOSPITAL_COMMUNITY): Payer: Self-pay | Admitting: Neurological Surgery

## 2021-03-01 ENCOUNTER — Ambulatory Visit (HOSPITAL_COMMUNITY): Payer: Medicare HMO

## 2021-03-01 DIAGNOSIS — Z96611 Presence of right artificial shoulder joint: Secondary | ICD-10-CM | POA: Insufficient documentation

## 2021-03-01 DIAGNOSIS — Z419 Encounter for procedure for purposes other than remedying health state, unspecified: Secondary | ICD-10-CM

## 2021-03-01 DIAGNOSIS — Z79899 Other long term (current) drug therapy: Secondary | ICD-10-CM | POA: Diagnosis not present

## 2021-03-01 DIAGNOSIS — Z7984 Long term (current) use of oral hypoglycemic drugs: Secondary | ICD-10-CM | POA: Diagnosis not present

## 2021-03-01 DIAGNOSIS — Z7982 Long term (current) use of aspirin: Secondary | ICD-10-CM | POA: Diagnosis not present

## 2021-03-01 DIAGNOSIS — M4807 Spinal stenosis, lumbosacral region: Secondary | ICD-10-CM | POA: Diagnosis not present

## 2021-03-01 DIAGNOSIS — E039 Hypothyroidism, unspecified: Secondary | ICD-10-CM | POA: Diagnosis not present

## 2021-03-01 DIAGNOSIS — Z9889 Other specified postprocedural states: Secondary | ICD-10-CM

## 2021-03-01 DIAGNOSIS — E119 Type 2 diabetes mellitus without complications: Secondary | ICD-10-CM | POA: Diagnosis not present

## 2021-03-01 DIAGNOSIS — M5117 Intervertebral disc disorders with radiculopathy, lumbosacral region: Secondary | ICD-10-CM | POA: Diagnosis not present

## 2021-03-01 DIAGNOSIS — J45909 Unspecified asthma, uncomplicated: Secondary | ICD-10-CM | POA: Diagnosis not present

## 2021-03-01 HISTORY — PX: LUMBAR LAMINECTOMY/DECOMPRESSION MICRODISCECTOMY: SHX5026

## 2021-03-01 LAB — GLUCOSE, CAPILLARY
Glucose-Capillary: 103 mg/dL — ABNORMAL HIGH (ref 70–99)
Glucose-Capillary: 112 mg/dL — ABNORMAL HIGH (ref 70–99)
Glucose-Capillary: 149 mg/dL — ABNORMAL HIGH (ref 70–99)
Glucose-Capillary: 87 mg/dL (ref 70–99)

## 2021-03-01 SURGERY — LUMBAR LAMINECTOMY/DECOMPRESSION MICRODISCECTOMY 1 LEVEL
Anesthesia: General | Site: Back | Laterality: Right

## 2021-03-01 MED ORDER — THROMBIN 5000 UNITS EX SOLR
CUTANEOUS | Status: AC
  Filled 2021-03-01: qty 5000

## 2021-03-01 MED ORDER — CEFAZOLIN SODIUM-DEXTROSE 2-4 GM/100ML-% IV SOLN: 2.0000 g | INTRAVENOUS | Status: DC

## 2021-03-01 MED ORDER — EPHEDRINE 5 MG/ML INJ
INTRAVENOUS | Status: AC
  Filled 2021-03-01: qty 5

## 2021-03-01 MED ORDER — DARIFENACIN HYDROBROMIDE ER 7.5 MG PO TB24: 7.5000 mg | ORAL_TABLET | Freq: Every day | ORAL | Status: DC

## 2021-03-01 MED ORDER — EPHEDRINE SULFATE 50 MG/ML IJ SOLN
INTRAMUSCULAR | Status: DC | PRN
  Administered 2021-03-01 (×4): 10 mg via INTRAVENOUS

## 2021-03-01 MED ORDER — METHOCARBAMOL 1000 MG/10ML IJ SOLN
500.0000 mg | Freq: Four times a day (QID) | INTRAVENOUS | Status: DC | PRN
  Filled 2021-03-01: qty 5

## 2021-03-01 MED ORDER — OXYCODONE HCL 5 MG/5ML PO SOLN: 5.0000 mg | Freq: Once | ORAL | Status: DC | PRN

## 2021-03-01 MED ORDER — PHENYLEPHRINE HCL-NACL 20-0.9 MG/250ML-% IV SOLN
INTRAVENOUS | Status: AC
  Filled 2021-03-01: qty 250

## 2021-03-01 MED ORDER — PHENYLEPHRINE 40 MCG/ML (10ML) SYRINGE FOR IV PUSH (FOR BLOOD PRESSURE SUPPORT)
PREFILLED_SYRINGE | INTRAVENOUS | Status: AC
  Filled 2021-03-01: qty 10

## 2021-03-01 MED ORDER — HYDROMORPHONE HCL 1 MG/ML IJ SOLN
0.2500 mg | INTRAMUSCULAR | Status: DC | PRN
  Administered 2021-03-01: 0.25 mg via INTRAVENOUS

## 2021-03-01 MED ORDER — GABAPENTIN 300 MG PO CAPS
ORAL_CAPSULE | ORAL | Status: AC
  Administered 2021-03-01: 300 mg via ORAL
  Filled 2021-03-01: qty 1

## 2021-03-01 MED ORDER — MELOXICAM 7.5 MG PO TABS
7.5000 mg | ORAL_TABLET | Freq: Every day | ORAL | Status: DC
  Administered 2021-03-01: 7.5 mg via ORAL
  Filled 2021-03-01: qty 1

## 2021-03-01 MED ORDER — THROMBIN 5000 UNITS EX SOLR
OROMUCOSAL | Status: DC | PRN
  Administered 2021-03-01: 5 mL via TOPICAL

## 2021-03-01 MED ORDER — METHOCARBAMOL 500 MG PO TABS: 500.0000 mg | ORAL_TABLET | Freq: Four times a day (QID) | ORAL | Status: DC | PRN

## 2021-03-01 MED ORDER — PHENOL 1.4 % MT LIQD: 1.0000 | OROMUCOSAL | Status: DC | PRN

## 2021-03-01 MED ORDER — LATANOPROST 0.005 % OP SOLN
1.0000 [drp] | Freq: Every day | OPHTHALMIC | Status: DC
  Filled 2021-03-01: qty 2.5

## 2021-03-01 MED ORDER — SODIUM CHLORIDE 0.9 % IV SOLN
250.0000 mL | INTRAVENOUS | Status: DC
  Administered 2021-03-01: 250 mL via INTRAVENOUS

## 2021-03-01 MED ORDER — HYDROCODONE-ACETAMINOPHEN 5-325 MG PO TABS: 1.0000 | ORAL_TABLET | ORAL | 0 refills | Status: AC | PRN

## 2021-03-01 MED ORDER — BUPIVACAINE HCL (PF) 0.25 % IJ SOLN
INTRAMUSCULAR | Status: AC
  Filled 2021-03-01: qty 30

## 2021-03-01 MED ORDER — CHLORHEXIDINE GLUCONATE CLOTH 2 % EX PADS: 6.0000 | MEDICATED_PAD | Freq: Once | CUTANEOUS | Status: DC

## 2021-03-01 MED ORDER — ACETAMINOPHEN 500 MG PO TABS
ORAL_TABLET | ORAL | Status: AC
  Administered 2021-03-01: 1000 mg via ORAL
  Filled 2021-03-01: qty 2

## 2021-03-01 MED ORDER — PHENYLEPHRINE HCL (PRESSORS) 10 MG/ML IV SOLN
INTRAVENOUS | Status: DC | PRN
  Administered 2021-03-01 (×3): 200 ug via INTRAVENOUS
  Administered 2021-03-01: 2625 ug via INTRAVENOUS
  Administered 2021-03-01: 80 ug via INTRAVENOUS

## 2021-03-01 MED ORDER — VANCOMYCIN HCL IN DEXTROSE 1-5 GM/200ML-% IV SOLN
1000.0000 mg | INTRAVENOUS | Status: AC
  Administered 2021-03-01: 1000 mg via INTRAVENOUS

## 2021-03-01 MED ORDER — ALBUTEROL SULFATE HFA 108 (90 BASE) MCG/ACT IN AERS: 2.0000 | INHALATION_SPRAY | Freq: Four times a day (QID) | RESPIRATORY_TRACT | Status: DC | PRN

## 2021-03-01 MED ORDER — MORPHINE SULFATE (PF) 2 MG/ML IV SOLN: 2.0000 mg | INTRAVENOUS | Status: DC | PRN

## 2021-03-01 MED ORDER — LEVOTHYROXINE SODIUM 112 MCG PO TABS
112.0000 ug | ORAL_TABLET | Freq: Every day | ORAL | Status: DC
  Filled 2021-03-01: qty 1

## 2021-03-01 MED ORDER — LIDOCAINE 2% (20 MG/ML) 5 ML SYRINGE
INTRAMUSCULAR | Status: AC
  Filled 2021-03-01: qty 5

## 2021-03-01 MED ORDER — SUGAMMADEX SODIUM 200 MG/2ML IV SOLN
INTRAVENOUS | Status: DC | PRN
  Administered 2021-03-01 (×2): 200 mg via INTRAVENOUS

## 2021-03-01 MED ORDER — DEXAMETHASONE SODIUM PHOSPHATE 10 MG/ML IJ SOLN
INTRAMUSCULAR | Status: DC | PRN
  Administered 2021-03-01: 10 mg via INTRAVENOUS

## 2021-03-01 MED ORDER — ACETAMINOPHEN 10 MG/ML IV SOLN
1000.0000 mg | Freq: Once | INTRAVENOUS | Status: DC | PRN
  Administered 2021-03-01: 1000 mg via INTRAVENOUS

## 2021-03-01 MED ORDER — MENTHOL 3 MG MT LOZG: 1.0000 | LOZENGE | OROMUCOSAL | Status: DC | PRN

## 2021-03-01 MED ORDER — ACETAMINOPHEN 10 MG/ML IV SOLN
INTRAVENOUS | Status: AC
  Filled 2021-03-01: qty 100

## 2021-03-01 MED ORDER — PHENYLEPHRINE HCL-NACL 20-0.9 MG/250ML-% IV SOLN
INTRAVENOUS | Status: DC | PRN
  Administered 2021-03-01: 50 ug/min via INTRAVENOUS

## 2021-03-01 MED ORDER — ACETAMINOPHEN 650 MG RE SUPP: 650.0000 mg | RECTAL | Status: DC | PRN

## 2021-03-01 MED ORDER — EMPAGLIFLOZIN 10 MG PO TABS: 10.0000 mg | ORAL_TABLET | ORAL | Status: DC

## 2021-03-01 MED ORDER — DULOXETINE HCL 30 MG PO CPEP: 60.0000 mg | ORAL_CAPSULE | Freq: Every day | ORAL | Status: DC

## 2021-03-01 MED ORDER — ALBUTEROL SULFATE (2.5 MG/3ML) 0.083% IN NEBU: 2.5000 mg | INHALATION_SOLUTION | Freq: Four times a day (QID) | RESPIRATORY_TRACT | Status: DC | PRN

## 2021-03-01 MED ORDER — HYDROCODONE-ACETAMINOPHEN 7.5-325 MG PO TABS
1.0000 | ORAL_TABLET | Freq: Four times a day (QID) | ORAL | Status: DC | PRN
Start: 2021-03-01 — End: 2021-03-01
  Administered 2021-03-01: 1 via ORAL
  Filled 2021-03-01: qty 1

## 2021-03-01 MED ORDER — ROPINIROLE HCL 1 MG PO TABS: 4.0000 mg | ORAL_TABLET | Freq: Two times a day (BID) | ORAL | Status: DC

## 2021-03-01 MED ORDER — VANCOMYCIN HCL IN DEXTROSE 1-5 GM/200ML-% IV SOLN
INTRAVENOUS | Status: AC
  Filled 2021-03-01: qty 200

## 2021-03-01 MED ORDER — CHLORHEXIDINE GLUCONATE 0.12 % MT SOLN
OROMUCOSAL | Status: AC
  Administered 2021-03-01: 15 mL via OROMUCOSAL
  Filled 2021-03-01: qty 15

## 2021-03-01 MED ORDER — POTASSIUM CHLORIDE IN NACL 20-0.9 MEQ/L-% IV SOLN: INTRAVENOUS | Status: DC

## 2021-03-01 MED ORDER — ONDANSETRON HCL 4 MG PO TABS: 4.0000 mg | ORAL_TABLET | Freq: Four times a day (QID) | ORAL | Status: DC | PRN

## 2021-03-01 MED ORDER — PROPOFOL 10 MG/ML IV BOLUS
INTRAVENOUS | Status: AC
  Filled 2021-03-01: qty 20

## 2021-03-01 MED ORDER — METFORMIN HCL 500 MG PO TABS: 1000.0000 mg | ORAL_TABLET | Freq: Every day | ORAL | Status: DC

## 2021-03-01 MED ORDER — CHLORHEXIDINE GLUCONATE 0.12 % MT SOLN: 15.0000 mL | Freq: Once | OROMUCOSAL | Status: AC

## 2021-03-01 MED ORDER — DEXAMETHASONE SODIUM PHOSPHATE 10 MG/ML IJ SOLN
INTRAMUSCULAR | Status: AC
  Filled 2021-03-01: qty 1

## 2021-03-01 MED ORDER — FENTANYL CITRATE (PF) 250 MCG/5ML IJ SOLN
INTRAMUSCULAR | Status: AC
  Filled 2021-03-01: qty 5

## 2021-03-01 MED ORDER — ACETAMINOPHEN 500 MG PO TABS: 1000.0000 mg | ORAL_TABLET | ORAL | Status: AC

## 2021-03-01 MED ORDER — LIDOCAINE 2% (20 MG/ML) 5 ML SYRINGE
INTRAMUSCULAR | Status: DC | PRN
  Administered 2021-03-01: 5 mL via INTRAVENOUS

## 2021-03-01 MED ORDER — PROPOFOL 10 MG/ML IV BOLUS
INTRAVENOUS | Status: DC | PRN
  Administered 2021-03-01: 100 mg via INTRAVENOUS

## 2021-03-01 MED ORDER — 0.9 % SODIUM CHLORIDE (POUR BTL) OPTIME
TOPICAL | Status: DC | PRN
  Administered 2021-03-01: 1000 mL

## 2021-03-01 MED ORDER — LACTATED RINGERS IV SOLN: INTRAVENOUS | Status: DC

## 2021-03-01 MED ORDER — GABAPENTIN 800 MG PO TABS
800.0000 mg | ORAL_TABLET | Freq: Every day | ORAL | Status: DC
  Filled 2021-03-01: qty 1

## 2021-03-01 MED ORDER — SENNA 8.6 MG PO TABS: 1.0000 | ORAL_TABLET | Freq: Two times a day (BID) | ORAL | Status: DC

## 2021-03-01 MED ORDER — AMISULPRIDE (ANTIEMETIC) 5 MG/2ML IV SOLN: 10.0000 mg | Freq: Once | INTRAVENOUS | Status: DC | PRN

## 2021-03-01 MED ORDER — ROCURONIUM BROMIDE 10 MG/ML (PF) SYRINGE
PREFILLED_SYRINGE | INTRAVENOUS | Status: AC
  Filled 2021-03-01: qty 10

## 2021-03-01 MED ORDER — GABAPENTIN 300 MG PO CAPS: 300.0000 mg | ORAL_CAPSULE | ORAL | Status: AC

## 2021-03-01 MED ORDER — OXYCODONE HCL 5 MG PO TABS: 5.0000 mg | ORAL_TABLET | Freq: Once | ORAL | Status: DC | PRN

## 2021-03-01 MED ORDER — SODIUM CHLORIDE 0.9% FLUSH: 3.0000 mL | INTRAVENOUS | Status: DC | PRN

## 2021-03-01 MED ORDER — IRBESARTAN 150 MG PO TABS: 75.0000 mg | ORAL_TABLET | Freq: Every day | ORAL | Status: DC

## 2021-03-01 MED ORDER — ONDANSETRON HCL 4 MG/2ML IJ SOLN
INTRAMUSCULAR | Status: DC | PRN
  Administered 2021-03-01: 4 mg via INTRAVENOUS

## 2021-03-01 MED ORDER — LACTATED RINGERS IV SOLN: INTRAVENOUS | Status: DC | PRN

## 2021-03-01 MED ORDER — HYDROMORPHONE HCL 1 MG/ML IJ SOLN
INTRAMUSCULAR | Status: AC
  Filled 2021-03-01: qty 1

## 2021-03-01 MED ORDER — SODIUM CHLORIDE 0.9% FLUSH: 3.0000 mL | Freq: Two times a day (BID) | INTRAVENOUS | Status: DC

## 2021-03-01 MED ORDER — FENTANYL CITRATE (PF) 100 MCG/2ML IJ SOLN
INTRAMUSCULAR | Status: DC | PRN
  Administered 2021-03-01: 100 ug via INTRAVENOUS

## 2021-03-01 MED ORDER — ORAL CARE MOUTH RINSE: 15.0000 mL | Freq: Once | OROMUCOSAL | Status: AC

## 2021-03-01 MED ORDER — GABAPENTIN 400 MG PO CAPS: 800.0000 mg | ORAL_CAPSULE | Freq: Every day | ORAL | Status: DC

## 2021-03-01 MED ORDER — MIDAZOLAM HCL 2 MG/2ML IJ SOLN
INTRAMUSCULAR | Status: AC
  Filled 2021-03-01: qty 2

## 2021-03-01 MED ORDER — ONDANSETRON HCL 4 MG/2ML IJ SOLN: 4.0000 mg | Freq: Once | INTRAMUSCULAR | Status: DC | PRN

## 2021-03-01 MED ORDER — BUPIVACAINE HCL (PF) 0.25 % IJ SOLN
INTRAMUSCULAR | Status: DC | PRN
  Administered 2021-03-01: 4 mL

## 2021-03-01 MED ORDER — CYCLOBENZAPRINE HCL 5 MG PO TABS: 5.0000 mg | ORAL_TABLET | Freq: Three times a day (TID) | ORAL | 0 refills | Status: DC | PRN

## 2021-03-01 MED ORDER — ROCURONIUM BROMIDE 10 MG/ML (PF) SYRINGE
PREFILLED_SYRINGE | INTRAVENOUS | Status: DC | PRN
Start: 2021-03-01 — End: 2021-03-01
  Administered 2021-03-01: 60 mg via INTRAVENOUS
  Administered 2021-03-01: 10 mg via INTRAVENOUS

## 2021-03-01 MED ORDER — ONDANSETRON HCL 4 MG/2ML IJ SOLN: 4.0000 mg | Freq: Four times a day (QID) | INTRAMUSCULAR | Status: DC | PRN

## 2021-03-01 MED ORDER — ACETAMINOPHEN 325 MG PO TABS: 650.0000 mg | ORAL_TABLET | ORAL | Status: DC | PRN

## 2021-03-01 MED ORDER — CEFAZOLIN SODIUM-DEXTROSE 2-4 GM/100ML-% IV SOLN
2.0000 g | Freq: Three times a day (TID) | INTRAVENOUS | Status: DC
  Administered 2021-03-01: 2 g via INTRAVENOUS
  Filled 2021-03-01: qty 100

## 2021-03-01 MED ORDER — ONDANSETRON HCL 4 MG/2ML IJ SOLN
INTRAMUSCULAR | Status: AC
  Filled 2021-03-01: qty 2

## 2021-03-01 SURGICAL SUPPLY — 39 items
BAG COUNTER SPONGE SURGICOUNT (BAG) ×4 IMPLANT
BAND RUBBER #18 3X1/16 STRL (MISCELLANEOUS) ×4 IMPLANT
BENZOIN TINCTURE PRP APPL 2/3 (GAUZE/BANDAGES/DRESSINGS) ×2 IMPLANT
BUR CARBIDE MATCH 3.0 (BURR) ×2 IMPLANT
CANISTER SUCT 3000ML PPV (MISCELLANEOUS) ×2 IMPLANT
DERMABOND ADVANCED (GAUZE/BANDAGES/DRESSINGS) ×1
DERMABOND ADVANCED .7 DNX12 (GAUZE/BANDAGES/DRESSINGS) ×1 IMPLANT
DRAPE LAPAROTOMY 100X72X124 (DRAPES) ×2 IMPLANT
DRAPE MICROSCOPE LEICA (MISCELLANEOUS) ×2 IMPLANT
DRAPE SURG 17X23 STRL (DRAPES) ×2 IMPLANT
DRSG OPSITE POSTOP 4X6 (GAUZE/BANDAGES/DRESSINGS) ×2 IMPLANT
DURAPREP 26ML APPLICATOR (WOUND CARE) ×2 IMPLANT
ELECT REM PT RETURN 9FT ADLT (ELECTROSURGICAL) ×2
ELECTRODE REM PT RTRN 9FT ADLT (ELECTROSURGICAL) ×1 IMPLANT
GAUZE 4X4 16PLY ~~LOC~~+RFID DBL (SPONGE) ×2 IMPLANT
GLOVE SURG ENC MOIS LTX SZ7 (GLOVE) IMPLANT
GLOVE SURG ENC MOIS LTX SZ8 (GLOVE) ×2 IMPLANT
GLOVE SURG UNDER POLY LF SZ7 (GLOVE) IMPLANT
GOWN STRL REUS W/ TWL LRG LVL3 (GOWN DISPOSABLE) ×2 IMPLANT
GOWN STRL REUS W/ TWL XL LVL3 (GOWN DISPOSABLE) ×1 IMPLANT
GOWN STRL REUS W/TWL 2XL LVL3 (GOWN DISPOSABLE) IMPLANT
GOWN STRL REUS W/TWL LRG LVL3 (GOWN DISPOSABLE) ×2
GOWN STRL REUS W/TWL XL LVL3 (GOWN DISPOSABLE) ×1
HEMOSTAT POWDER KIT SURGIFOAM (HEMOSTASIS) ×2 IMPLANT
KIT BASIN OR (CUSTOM PROCEDURE TRAY) ×2 IMPLANT
KIT TURNOVER KIT B (KITS) ×2 IMPLANT
NEEDLE HYPO 25X1 1.5 SAFETY (NEEDLE) ×2 IMPLANT
NEEDLE SPNL 20GX3.5 QUINCKE YW (NEEDLE) ×2 IMPLANT
NS IRRIG 1000ML POUR BTL (IV SOLUTION) ×2 IMPLANT
PACK LAMINECTOMY NEURO (CUSTOM PROCEDURE TRAY) ×2 IMPLANT
PAD ARMBOARD 7.5X6 YLW CONV (MISCELLANEOUS) ×6 IMPLANT
STRIP CLOSURE SKIN 1/2X4 (GAUZE/BANDAGES/DRESSINGS) ×2 IMPLANT
SUT VIC AB 0 CT1 18XCR BRD8 (SUTURE) ×1 IMPLANT
SUT VIC AB 0 CT1 8-18 (SUTURE) ×1
SUT VIC AB 2-0 CP2 18 (SUTURE) ×2 IMPLANT
SUT VIC AB 3-0 SH 8-18 (SUTURE) ×2 IMPLANT
TOWEL GREEN STERILE (TOWEL DISPOSABLE) ×2 IMPLANT
TOWEL GREEN STERILE FF (TOWEL DISPOSABLE) ×2 IMPLANT
WATER STERILE IRR 1000ML POUR (IV SOLUTION) ×2 IMPLANT

## 2021-03-01 NOTE — Transfer of Care (Signed)
Immediate Anesthesia Transfer of Care Note  Patient: Jessica Cole  Procedure(s) Performed: Microdiscectomy - Lumbar five-Sacral one - right (Right: Back)  Patient Location: PACU  Anesthesia Type:General  Level of Consciousness: drowsy  Airway & Oxygen Therapy: Patient Spontanous Breathing and Patient connected to face mask oxygen  Post-op Assessment: Report given to RN and Post -op Vital signs reviewed and stable  Post vital signs: Reviewed and stable  Last Vitals:  Vitals Value Taken Time  BP 135/71 03/01/21 1015  Temp    Pulse 80 03/01/21 1016  Resp 20 03/01/21 1016  SpO2 99 % 03/01/21 1016  Vitals shown include unvalidated device data.  Last Pain:  Vitals:   03/01/21 0614  TempSrc:   PainSc: 6          Complications: No notable events documented.

## 2021-03-01 NOTE — Plan of Care (Signed)
Patient alert and oriented, mae's well, voiding adequate amount of urine, swallowing without difficulty, no c/o pain at time of discharge. Patient discharged home with family. Script and discharged instructions given to patient. Patient and family stated understanding of instructions given. Patient has an appointment with Dr. Jones in 2 weeks ?

## 2021-03-01 NOTE — H&P (Signed)
Subjective: Patient is a 70 y.o. female admitted for leg pain. Onset of symptoms was several weeks ago, gradually worsening since that time.  The pain is rated severe, and is located at the across the lower back and radiates to R>L legs. The pain is described as aching and occurs all day. The symptoms have been progressive. Symptoms are exacerbated by exercise, standing, and twisting. MRI or CT showed large HNP L5-S1 R   Past Medical History:  Diagnosis Date   Anxiety    Asthma    seasonal with allergies   Colon polyps    Degenerative arthritis    Depression    Diabetes mellitus without complication (HCC)    Type II   Environmental allergies    Family history of adverse reaction to anesthesia    sister- PONV   Fatty liver    Glaucoma    Headache(784.0)    HX  MIGRAINES   History of bronchitis    History of kidney stones    History of pneumonia    Hypertension    Hypothyroidism    Obesity    OSA on CPAP    Plantar fasciitis    right   PONV (postoperative nausea and vomiting)    pt has never had any anesthesia complications, no post-op nausea or vomiting   Renal calculi    Renal calculi    Rheumatoid arthritis (HCC)    RLS (restless legs syndrome) 08/23/2014    Past Surgical History:  Procedure Laterality Date   ABDOMINAL HYSTERECTOMY     partial   APPENDECTOMY     Arthroscopic surgery, knee Left    BREAST BIOPSY Right    several   BREAST BIOPSY Right 03/11/2017   u/s bx neg   BUNIONECTOMY     LEFT    COLONOSCOPY W/ POLYPECTOMY     COLONOSCOPY WITH PROPOFOL N/A 08/14/2017   Procedure: COLONOSCOPY WITH PROPOFOL;  Surgeon: Christena Deem, MD;  Location: [facility] ENDOSCOPY;  Service: Endoscopy;  Laterality: N/A;   EXTRACORPOREAL SHOCK WAVE LITHOTRIPSY Left 05/14/2017   Procedure: EXTRACORPOREAL SHOCK WAVE LITHOTRIPSY (ESWL);  Surgeon: Riki Altes, MD;  Location: [facility] ORS;  Service: Urology;  Laterality: Left;   EXTRACORPOREAL SHOCK WAVE LITHOTRIPSY Left 06/14/2020    Procedure: EXTRACORPOREAL SHOCK WAVE LITHOTRIPSY (ESWL);  Surgeon: Sondra Come, MD;  Location: [facility] ORS;  Service: Urology;  Laterality: Left;   FOOT SURGERY     RIGHT     KNEE ARTHROSCOPY W/ MENISCAL REPAIR Left    LASIK     MAXIMUM ACCESS (MAS)POSTERIOR LUMBAR INTERBODY FUSION (PLIF) 1 LEVEL N/A 04/25/2016   Procedure: LUMBAR FOUR-FIVE  MAXIMUM ACCESS (MAS) POSTERIOR LUMBAR INTERBODY FUSION (PLIF) with extension of instrumentation LUMBAR TWO-FIVE;  Surgeon: Tia Alert, MD;  Location: [facility] OR;  Service: Neurosurgery;  Laterality: N/A;   MAXIMUM ACCESS (MAS)POSTERIOR LUMBAR INTERBODY FUSION (PLIF) 2 LEVEL N/A 12/13/2015   Procedure: Lumbar two-three - Lumbar three-four MAXIMUM ACCESS (MAS) POSTERIOR LUMBAR INTERBODY FUSION (PLIF)  ;  Surgeon: Tia Alert, MD;  Location: [facility] NEURO ORS;  Service: Neurosurgery;  Laterality: N/A;   REVERSE SHOULDER ARTHROPLASTY Left 03/01/2020   Procedure: REVERSE SHOULDER ARTHROPLASTY;  Surgeon: Christena Flake, MD;  Location: [facility] ORS;  Service: Orthopedics;  Laterality: Left;   SHOULDER SURGERY     RIGHT    TONSILLECTOMY      Prior to Admission medications   Medication Sig Start Date End Date Taking? Authorizing Provider  albuterol (PROVENTIL HFA;VENTOLIN HFA) 108 (90  Base) MCG/ACT inhaler Inhale 2 puffs into the lungs every 6 (six) hours as needed for shortness of breath or wheezing.   Yes [provider]  aspirin 81 MG EC tablet Take 81 mg by mouth in the morning, at noon, and at bedtime.   Yes [provider]  azelastine (ASTELIN) 0.1 % nasal spray Place 1 spray into both nostrils daily as needed for rhinitis or allergies. 11/23/15  Yes [provider]  Cholecalciferol (VITAMIN D) 50 MCG (2000 UT) CAPS Take 2,000 Units by mouth daily.   Yes [provider]  DULoxetine (CYMBALTA) 60 MG capsule Take 60 mg by mouth daily.   Yes [provider]  empagliflozin (JARDIANCE) 10 MG TABS tablet Take 10 mg by mouth every  other day.   Yes [provider]  estradiol (ESTRACE) 0.1 MG/GM vaginal cream Place 1 Applicatorful vaginally daily as needed (vaginal irritation). 05/29/20  Yes [provider]  fluticasone (FLONASE) 50 MCG/ACT nasal spray Place 2 sprays into both nostrils daily as needed for allergies. 04/12/14  Yes [provider]  folic acid (FOLVITE) 1 MG tablet Take 1 mg by mouth daily.   Yes [provider]  gabapentin (NEURONTIN) 800 MG tablet TAKE 1 TABLET BY MOUTH TWICE A DAY Patient taking differently: Take 800 mg by mouth at bedtime. 09/28/19  Yes Glean Salvo, NP  HYDROcodone-acetaminophen (NORCO/VICODIN) 5-325 MG tablet Take 1 tablet by mouth every 8 (eight) hours as needed for moderate pain. 11/04/20 11/04/21 Yes Baity, Salvadore Oxford, NP  latanoprost (XALATAN) 0.005 % ophthalmic solution Place 1 drop into both eyes at bedtime.    Yes [provider]  levothyroxine (SYNTHROID, LEVOTHROID) 112 MCG tablet Take 112 mcg by mouth daily before breakfast.  11/07/15  Yes [provider]  meloxicam (MOBIC) 7.5 MG tablet Take 7.5 mg by mouth daily.   Yes [provider]  metFORMIN (GLUCOPHAGE) 1000 MG tablet Take 1,000 mg by mouth daily.   Yes [provider]  methotrexate (RHEUMATREX) 2.5 MG tablet Take 15 mg by mouth every Saturday. Caution:Chemotherapy. Protect from light.   Yes [provider]  OVER THE COUNTER MEDICATION Take 2 capsules by mouth daily. Brain Support   Yes [provider]  pravastatin (PRAVACHOL) 20 MG tablet Take 20 mg by mouth at bedtime.   Yes [provider]  rOPINIRole (REQUIP) 4 MG tablet Take 1 tablet (4 mg total) by mouth 2 (two) times daily. 05/09/19  Yes Glean Salvo, NP  solifenacin (VESICARE) 5 MG tablet Take 10 mg by mouth daily. 05/29/20  Yes [provider]  telmisartan (MICARDIS) 80 MG tablet Take 80 mg by mouth daily. 01/02/19  Yes [provider]  cyclobenzaprine  (FLEXERIL) 5 MG tablet Take 1 tablet (5 mg total) by mouth 3 (three) times daily as needed for muscle spasms. Patient not taking: No sig reported 11/04/20   Lorre Munroe, NP  metoprolol succinate (TOPROL-XL) 50 MG 24 hr tablet TAKE 1 TABLET BY MOUTH DAILY. TAKE WITH OR IMMEDIATELY FOLLOWING A MEAL. Patient not taking: No sig reported 08/08/20   Glean Salvo, NP  ONE TOUCH ULTRA TEST test strip USE 2 (TWO) TIMES DAILY USE AS INSTRUCTED. 11/13/17   [provider]  tamsulosin (FLOMAX) 0.4 MG CAPS capsule TAKE 1 CAPSULE BY MOUTH EVERY DAY Patient not taking: No sig reported 11/06/20   Stoioff, Verna Czech, MD  traMADol (ULTRAM) 50 MG tablet TAKE 1/2-1 TABLET BY MOUTH 2 TIMES A DAY AS  NEEDED Patient not taking: No sig reported 10/08/20   York Spaniel, MD   Allergies  Allergen Reactions   Diazepam Other (See Comments)    hallucinations   Dexamethasone Other (See Comments)    During a tapered dose, once.  Was shaky (side effect, not allergy).    Social History   Tobacco Use   Smoking status: Never   Smokeless tobacco: Never  Substance Use Topics   Alcohol use: No    Family History  Problem Relation Age of Onset   Lung cancer Mother    Congestive Heart Failure Father    Depression Sister    Rheum arthritis Sister    Headache Maternal Grandfather    Migraines Maternal Grandfather    Headache Maternal Uncle    Diabetes Other    Heart disease Other    Hypertension Other    Breast cancer Neg Hx    Bladder Cancer Neg Hx    Kidney cancer Neg Hx      Review of Systems  Positive ROS: neg  All other systems have been reviewed and were otherwise negative with the exception of those mentioned in the HPI and as above.  Objective: Vital signs in last 24 hours: Temp:  [97.7 F (36.5 C)] 97.7 F (36.5 C) (10/28 0603) Pulse Rate:  [64] 64 (10/28 0603) Resp:  [18] 18 (10/28 0603) BP: (146)/(77) 146/77 (10/28 0603) SpO2:  [93 %] 93 % (10/28 0603) Weight:  [87.5 kg] 87.5 kg  (10/28 0603)  General Appearance: Alert, cooperative, no distress, appears stated age Head: Normocephalic, without obvious abnormality, atraumatic Eyes: PERRL, conjunctiva/corneas clear, EOM's intact    Neck: Supple, symmetrical, trachea midline Back: Symmetric, no curvature, ROM normal, no CVA tenderness Lungs:  respirations unlabored Heart: Regular rate and rhythm Abdomen: Soft, non-tender Extremities: Extremities normal, atraumatic, no cyanosis or edema Pulses: 2+ and symmetric all extremities Skin: Skin color, texture, turgor normal, no rashes or lesions  NEUROLOGIC:   Mental status: Alert and oriented x4,  no aphasia, good attention span, fund of knowledge, and memory Motor Exam - grossly normal Sensory Exam - grossly normal Reflexes: trace Coordination - grossly normal Gait - not tested Balance - not tested Cranial Nerves: I: smell Not tested  II: visual acuity  OS: nl    OD: nl  II: visual fields Full to confrontation  II: pupils Equal, round, reactive to light  III,VII: ptosis None  III,IV,VI: extraocular muscles  Full ROM  V: mastication Normal  V: facial light touch sensation  Normal  V,VII: corneal reflex  Present  VII: facial muscle function - upper  Normal  VII: facial muscle function - lower Normal  VIII: hearing Not tested  IX: soft palate elevation  Normal  IX,X: gag reflex Present  XI: trapezius strength  5/5  XI: sternocleidomastoid strength 5/5  XI: neck flexion strength  5/5  XII: tongue strength  Normal    Data Review Lab Results  Component Value Date   WBC 7.7 03/02/2020   HGB 12.1 03/02/2020   HCT 35.5 (L) 03/02/2020   MCV 91.5 03/02/2020   PLT 134 (L) 03/02/2020   Lab Results  Component Value Date   NA 142 02/14/2021   K 4.5 02/14/2021   CL 108 02/14/2021   CO2 26 02/14/2021   BUN 16 02/14/2021   CREATININE 0.75 02/14/2021   GLUCOSE 147 (H) 02/14/2021   Lab Results  Component Value Date   INR 1.0 02/14/2021     Assessment/Plan:  Estimated  body mass index is 38.98 kg/m as calculated from the following:   Height as of this encounter: 4\' 11"  (1.499 m).   Weight as of this encounter: 87.5 kg. Patient admitted for R L5-S1 microdiskectomy. Patient has failed a reasonable attempt at conservative therapy.  I explained the condition and procedure to the patient and answered any questions.  Patient wishes to proceed with procedure as planned. Understands risks/ benefits and typical outcomes of procedure.   03/01/2021 7:51 AM

## 2021-03-01 NOTE — Discharge Summary (Signed)
Physician Discharge Summary  Patient ID: Jessica Cole MRN: [id number] DOB/AGE: [birth year] 70 y.o.  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 The patient tolerated the procedure well and was taken to the recovery room and then to the floor in stable condition. The hospital course was routine. There were no complications. The wound remained clean dry and intact. Pt had appropriate back soreness. No complaints of leg pain or new N/T/W. The patient remained afebrile with stable vital signs, and tolerated a regular diet. The patient continued to increase activities, and pain was well controlled with oral pain medications.   Consults: None  Significant Diagnostic Studies:  Results for orders placed or performed during the hospital encounter of 03/01/21  Glucose, capillary  Result Value Ref Range   Glucose-Capillary 103 (H) 70 - 99 mg/dL  Glucose, capillary  Result Value Ref Range   Glucose-Capillary 112 (H) 70 - 99 mg/dL  Glucose, capillary  Result Value Ref Range   Glucose-Capillary 149 (H) 70 - 99 mg/dL    DG Wrist Complete Right  Result Date: 02/16/2021 CLINICAL DATA:  Pain, ecchymosis secondary to fall EXAM: RIGHT WRIST - COMPLETE 3+ VIEW COMPARISON:  Hand MRI 09/17/2020 FINDINGS: There is an ossific fragment along the dorsum of the hand seen on the lateral projection suspicious for acute triquetral fracture. No other acute fracture is seen. Bony alignment is normal. There is joint space narrowing with probable erosive change involving the middle finger MCP joint. There is irregularity of the triscaphe joint. The other joint  spaces are overall preserved. There is soft tissue swelling over the dorsum of the hand. IMPRESSION: Ossific fragment along the dorsum of the wrist consistent with acute triquetral fracture with associated soft tissue swelling. Electronically Signed   By: Lesia Hausen M.D.   On: 02/16/2021 08:38   DG Lumbar Spine 1 View  Result Date: 03/01/2021 CLINICAL DATA:  Lumbar surgery. EXAM: LUMBAR SPINE - 1 VIEW COMPARISON:  MRI lumbar spine dated January 15, 2021. FINDINGS: Single lateral intraoperative x-ray demonstrates surgical probe posterior to the L5-S1 disc space. Prior L2-L5 PLIF. IMPRESSION: 1. Intraoperative localization posterior to the L5-S1 disc space. Electronically Signed   By: Obie Dredge M.D.   On: 03/01/2021 14:19    Antibiotics:  Anti-infectives (From admission, onward)    Start     Dose/Rate Route Frequency Ordered Stop   03/01/21 1330  ceFAZolin (ANCEF) IVPB 2g/100 mL premix        2 g 200 mL/hr over 30 Minutes Intravenous Every 8 hours 03/01/21 1230 03/02/21 0529   03/01/21 0600  ceFAZolin (ANCEF) IVPB 2g/100 mL premix  Status:  Discontinued        2 g 200 mL/hr over 30 Minutes Intravenous On call to O.R. 03/01/21 0543 03/01/21 0625   03/01/21 0555  vancomycin (VANCOCIN) 1-5 GM/200ML-% IVPB       Note to Pharmacy: Kandice Hams   : cabinet override      03/01/21 0555 03/01/21 0850   03/01/21 0545  vancomycin (VANCOCIN) IVPB 1000 mg/200 mL premix        1,000 mg 200 mL/hr over 60 Minutes Intravenous To Surgery 03/01/21 0543 03/01/21 0825  Discharge Exam: Blood pressure 129/65, pulse 65, temperature 97.8 F (36.6 C), resp. rate 20, height 4\' 11"  (1.499 m), weight 87.5 kg, SpO2 94 %. Neurologic: Grossly normal Ambulating and voiding well incision cdi   Discharge Medications:   Allergies as of 03/01/2021       Reactions   Diazepam Other (See Comments)   hallucinations   Dexamethasone Other (See Comments)   During a tapered dose, once.  Was shaky (side  effect, not allergy).        Medication List     STOP taking these medications    traMADol 50 MG tablet Commonly known as: ULTRAM       TAKE these medications    albuterol 108 (90 Base) MCG/ACT inhaler Commonly known as: VENTOLIN HFA Inhale 2 puffs into the lungs every 6 (six) hours as needed for shortness of breath or wheezing.   aspirin 81 MG EC tablet Take 81 mg by mouth in the morning, at noon, and at bedtime.   azelastine 0.1 % nasal spray Commonly known as: ASTELIN Place 1 spray into both nostrils daily as needed for rhinitis or allergies.   cyclobenzaprine 5 MG tablet Commonly known as: FLEXERIL Take 1 tablet (5 mg total) by mouth 3 (three) times daily as needed for muscle spasms.   DULoxetine 60 MG capsule Commonly known as: CYMBALTA Take 60 mg by mouth daily.   empagliflozin 10 MG Tabs tablet Commonly known as: JARDIANCE Take 10 mg by mouth every other day.   estradiol 0.1 MG/GM vaginal cream Commonly known as: ESTRACE Place 1 Applicatorful vaginally daily as needed (vaginal irritation).   fluticasone 50 MCG/ACT nasal spray Commonly known as: FLONASE Place 2 sprays into both nostrils daily as needed for allergies.   folic acid 1 MG tablet Commonly known as: FOLVITE Take 1 mg by mouth daily.   gabapentin 800 MG tablet Commonly known as: NEURONTIN TAKE 1 TABLET BY MOUTH TWICE A DAY What changed: when to take this   HYDROcodone-acetaminophen 5-325 MG tablet Commonly known as: NORCO/VICODIN Take 1 tablet by mouth every 4 (four) hours as needed for moderate pain. What changed: when to take this   latanoprost 0.005 % ophthalmic solution Commonly known as: XALATAN Place 1 drop into both eyes at bedtime.   levothyroxine 112 MCG tablet Commonly known as: SYNTHROID Take 112 mcg by mouth daily before breakfast.   meloxicam 7.5 MG tablet Commonly known as: MOBIC Take 7.5 mg by mouth daily.   metFORMIN 1000 MG tablet Commonly known as:  GLUCOPHAGE Take 1,000 mg by mouth daily.   methotrexate 2.5 MG tablet Commonly known as: RHEUMATREX Take 15 mg by mouth every Saturday. Caution:Chemotherapy. Protect from light.   metoprolol succinate 50 MG 24 hr tablet Commonly known as: TOPROL-XL TAKE 1 TABLET BY MOUTH DAILY. TAKE WITH OR IMMEDIATELY FOLLOWING A MEAL.   ONE TOUCH ULTRA TEST test strip Generic drug: glucose blood USE 2 (TWO) TIMES DAILY USE AS INSTRUCTED.   OVER THE COUNTER MEDICATION Take 2 capsules by mouth daily. Brain Support   pravastatin 20 MG tablet Commonly known as: PRAVACHOL Take 20 mg by mouth at bedtime.   rOPINIRole 4 MG tablet Commonly known as: REQUIP Take 1 tablet (4 mg total) by mouth 2 (two) times daily.   solifenacin 5 MG tablet Commonly known as: VESICARE Take 10 mg by mouth daily.   tamsulosin 0.4 MG Caps capsule Commonly known as: FLOMAX TAKE 1 CAPSULE BY MOUTH EVERY DAY   telmisartan 80 MG tablet  Commonly known as: MICARDIS Take 80 mg by mouth daily.   Vitamin D 50 MCG (2000 UT) Caps Take 2,000 Units by mouth daily.        Disposition: home   Final Dx: right microdiscectomy l5-S1  Discharge Instructions     Call MD for:  difficulty breathing, headache or visual disturbances   Complete by: As directed    Call MD for:  hives   Complete by: As directed    Call MD for:  persistant dizziness or light-headedness   Complete by: As directed    Call MD for:  persistant nausea and vomiting   Complete by: As directed    Call MD for:  redness, tenderness, or signs of infection (pain, swelling, redness, odor or green/yellow discharge around incision site)   Complete by: As directed    Call MD for:  severe uncontrolled pain   Complete by: As directed    Call MD for:  temperature >100.4   Complete by: As directed    Diet - low sodium heart healthy   Complete by: As directed    Driving Restrictions   Complete by: As directed    No driving for 2 weeks, no riding in the car  for 1 week   Increase activity slowly   Complete by: As directed    Lifting restrictions   Complete by: As directed    No lifting more than 8 lbs   Remove dressing in 48 hours   Complete by: As directed           Signed: Tiana Loft Shaela Boer 03/01/2021, 3:56 PM

## 2021-03-01 NOTE — Progress Notes (Signed)
Upon entry to room, patient very sleepy but responds to questions.  Glucose checked due to history of diabetes - CBG 112.  Medications reviewed.  Patient states that she took 800 mg of Gabapentin at 11pm and again this morning at 0500.  Patient given 300 mg of gabapentin in pre-op.   Patient on monitor and vitals are within normal limits.  Dr. Yetta Barre and Dr. Richardson Landry aware.  No new orders received.

## 2021-03-01 NOTE — Anesthesia Procedure Notes (Signed)
Procedure Name: Intubation Date/Time: 03/01/2021 8:14 AM Performed by: Kerry Fort, CRNA Pre-anesthesia Checklist: Patient identified, Emergency Drugs available, Suction available and Patient being monitored Patient Re-evaluated:Patient Re-evaluated prior to induction Oxygen Delivery Method: Circle system utilized Preoxygenation: Pre-oxygenation with 100% oxygen Induction Type: IV induction Ventilation: Mask ventilation without difficulty and Oral airway inserted - appropriate to patient size Laryngoscope Size: Mac and 3 Grade View: Grade I Tube type: Oral Tube size: 7.0 mm Number of attempts: 1 Airway Equipment and Method: Stylet and Oral airway Placement Confirmation: ETT inserted through vocal cords under direct vision, positive ETCO2 and breath sounds checked- equal and bilateral Secured at: 21 cm Tube secured with: Tape Dental Injury: Teeth and Oropharynx as per pre-operative assessment

## 2021-03-01 NOTE — Op Note (Signed)
03/01/2021  10:07 AM  PATIENT:  Jessica Cole  70 y.o. 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 Imaging showed lateral recess stenosis L4-S1 on the right with a herniated disc and an inferior free fragment. The patient tried conservative measures without relief. Pain was debilitating. Recommended decompressive hemilaminectomy with microdiscectomy. Patient understood the risks, benefits, and alternatives and potential outcomes and wished to proceed.  PROCEDURE DETAILS: The patient was taken to the operating room and after induction of adequate generalized endotracheal anesthesia, the patient was rolled into the prone position on the Wilson frame and all pressure points were padded. The lumbar region was cleaned and then prepped with DuraPrep and draped in the usual sterile fashion. 5 cc of local anesthesia was injected and then a dorsal midline incision was made and carried down to the lumbo sacral fascia. The fascia was opened and the paraspinous musculature was taken down in a subperiosteal fashion to expose L5-S1 on the right. Intraoperative x-ray confirmed my level, and then I used a combination of the high-speed drill and the Kerrison punches to perform a hemilaminectomy, medial facetectomy, and foraminotomy at L5-S1 on the right. The underlying yellow ligament was opened and removed in a piecemeal fashion to expose the underlying dura  and exiting nerve root. I undercut the lateral recess and dissected down until I was medial to and distal to the pedicle. The nerve root was well decompressed. We then gently retracted the nerve root medially with a retractor, coagulated the epidural venous vasculature, and incised the disc space. A performed a thorough intradiscal discectomy with pituitary rongeurs and curettes, until I had a nice decompression of the nerve root and the midline. I then palpated with a coronary dilator along the nerve root and into the foramen to assure adequate decompression. I felt no more compression of the nerve root. I irrigated with saline solution containing bacitracin. Achieved hemostasis with bipolar cautery, lined the dura with Gelfoam, and then closed the fascia with 0 Vicryl. I closed the subcutaneous tissues with 2-0 Vicryl and the subcuticular tissues with 3-0 Vicryl. The skin was then closed with benzoin and Steri-Strips. The drapes were removed, a sterile dressing was applied.  My nurse practitioner was involved in the exposure, safe retraction of the neural elements, the disc work and the closure. the patient was awakened from general anesthesia and transferred to the recovery room in stable condition. At the end of the procedure all sponge, needle and instrument counts were correct.    PLAN OF CARE: Admit for overnight observation  PATIENT DISPOSITION:  PACU - hemodynamically stable.   Delay start of Pharmacological VTE agent (>24hrs) due to surgical blood loss or risk of bleeding:  yes

## 2021-03-01 NOTE — Anesthesia Postprocedure Evaluation (Signed)
Anesthesia Post Note  Patient: Jessica Cole  Procedure(s) Performed: Microdiscectomy - Lumbar five-Sacral one - right (Right: Back)     Patient location during evaluation: PACU Anesthesia Type: General Level of consciousness: awake and alert Pain management: pain level controlled Vital Signs Assessment: post-procedure vital signs reviewed and stable Respiratory status: spontaneous breathing, nonlabored ventilation, respiratory function stable and patient connected to nasal cannula oxygen Cardiovascular status: blood pressure returned to baseline and stable Postop Assessment: no apparent nausea or vomiting Anesthetic complications: no   No notable events documented.  Last Vitals:  Vitals:   03/01/21 1145 03/01/21 1224  BP: 104/62 129/65  Pulse: 65 65  Resp: 13 20  Temp: 36.7 C 36.6 C  SpO2: 97% 94%    Last Pain:  Vitals:   03/01/21 1145  TempSrc:   PainSc: 3                  Trevor Iha

## 2021-03-01 NOTE — Evaluation (Signed)
Occupational Therapy Evaluation Patient Details Name: Jessica Cole MRN: [id number] DOB: [birth year] Today's Date: 03/01/2021   History of Present Illness 70 yo female s/p L5-S1 hemilaminectomy medial facetectomy foraminotomies on 10/28. PMH including anxiety, DM, HTN, obesity, RA, TKA, prior PLIF (2017), and shoulder replacement (2021).   Clinical Impression   PTA, pt was living with her husband and was independent. Currently, pt requires Supervision-Min Guard A for ADLs and functional mobility using RW. Provided education and handout on back precautions, bed mobility, grooming, LB ADLs, toileting, and tub transfer with 3n1; pt demonstrated understanding. Answered all pt questions. Recommend dc home once medically stable per physician. All acute OT needs met and will sign off. Thank you.      Recommendations for follow up therapy are one component of a multi-disciplinary discharge planning process, led by the attending physician.  Recommendations may be updated based on patient status, additional functional criteria and insurance authorization.   Follow Up Recommendations  No OT follow up    Assistance Recommended at Discharge Frequent or constant Supervision/Assistance  Functional Status Assessment  Patient has had a recent decline in their functional status and demonstrates the ability to make significant improvements in function in a reasonable and predictable amount of time.  Equipment Recommendations  None recommended by OT    Recommendations for Other Services       Precautions / Restrictions Precautions Precautions: Back Precaution Booklet Issued: Yes (comment) Precaution Comments: Pt unable to recall back precautions at begining or end of session. Required Braces or Orthoses: Other Brace Other Brace: No brace per MD Restrictions Weight Bearing Restrictions: No      Mobility Bed Mobility Overal bed mobility: Needs Assistance Bed Mobility: Rolling;Sidelying to Sit;Sit  to Sidelying Rolling: Min guard Sidelying to sit: Min guard     Sit to sidelying: Min assist General bed mobility comments: education on log roll tehcniques. Difficult getting back in bed. practicing several time. assist with min a for LEs    Transfers Overall transfer level: Needs assistance Equipment used: None Transfers: Sit to/from Stand Sit to Stand: Min guard                  Balance Overall balance assessment: Needs assistance Sitting-balance support: No upper extremity supported;Feet supported Sitting balance-Leahy Scale: Good     Standing balance support: During functional activity;Reliant on assistive device for balance;Bilateral upper extremity supported Standing balance-Leahy Scale: Fair                             ADL either performed or assessed with clinical judgement   ADL Overall ADL's : Needs assistance/impaired Eating/Feeding: Set up;Sitting   Grooming: Wash/dry hands;Standing;Min guard Grooming Details (indicate cue type and reason): Educating on comepsnatory techniques for oral care Upper Body Bathing: Set up;Sitting   Lower Body Bathing: Min guard;Sit to/from stand   Upper Body Dressing : Set up;Sitting   Lower Body Dressing: Min guard;Sitting/lateral leans;Moderate assistance Lower Body Dressing Details (indicate cue type and reason): Pt able to don underwear with Min Guard A; cues for figure four. Assist for socks which husband will do Toilet Transfer: Nature conservation officer;Ambulation;Rolling walker (2 wheels)   Toileting- Clothing Manipulation and Hygiene: Supervision/safety;Sitting/lateral lean     Tub/Shower Transfer Details (indicate cue type and reason): educating pt on tub trnasfer with use of 3n1. Functional mobility during ADLs: Min guard;Rolling walker (2 wheels) General ADL Comments: Pt performing ADLs at supervision-Min Guard A level  Vision Baseline Vision/History: 1 Wears glasses Patient Visual Report:  No change from baseline       Perception     Praxis      Pertinent Vitals/Pain       Hand Dominance Right   Extremity/Trunk Assessment Upper Extremity Assessment Upper Extremity Assessment: RUE deficits/detail RUE Deficits / Details: R wrist fx with splint; fall ~3 month ago. unsure of WB status   Lower Extremity Assessment Lower Extremity Assessment: Defer to PT evaluation   Cervical / Trunk Assessment Cervical / Trunk Assessment: Back Surgery   Communication Communication Communication: No difficulties   Cognition Arousal/Alertness: Awake/alert Behavior During Therapy: WFL for tasks assessed/performed Overall Cognitive Status: Impaired/Different from baseline Area of Impairment: Memory                     Memory: Decreased recall of precautions         General Comments: unable to recall back precautions despite verbal and visual education.     General Comments  husband present throughout    Exercises     Shoulder Instructions      Home Living Family/patient expects to be discharged to:: Private residence Living Arrangements: Spouse/significant other Available Help at Discharge: Family;Available 24 hours/day Type of Home: House Home Access: Stairs to enter [employer] of Steps: 1 Entrance Stairs-Rails: None Home Layout: One level     Bathroom Shower/Tub: Teacher, early years/pre: Standard     Home Equipment: Conservation officer, nature (2 wheels);BSC;Cane - quad;Cane - single point;Hand held shower head   Additional Comments: adjustable bed      Prior Functioning/Environment Prior Level of Function : Independent/Modified Independent;History of Falls (last six months)                        OT Problem List: Decreased strength;Decreased range of motion;Decreased activity tolerance;Impaired balance (sitting and/or standing);Decreased knowledge of use of DME or AE;Decreased knowledge of precautions;Decreased  cognition;Pain      OT Treatment/Interventions:      OT Goals(Current goals can be found in the care plan section) Acute Rehab OT Goals Patient Stated Goal: Go home today OT Goal Formulation: All assessment and education complete, DC therapy  OT Frequency:     Barriers to D/C:            Co-evaluation              AM-PAC OT "6 Clicks" Daily Activity     Outcome Measure Help from another person eating meals?: None Help from another person taking care of personal grooming?: A Little Help from another person toileting, which includes using toliet, bedpan, or urinal?: A Little Help from another person bathing (including washing, rinsing, drying)?: A Little Help from another person to put on and taking off regular upper body clothing?: A Little Help from another person to put on and taking off regular lower body clothing?: A Little 6 Click Score: 19   End of Session Equipment Utilized During Treatment: Gait belt;Rolling walker (2 wheels) Nurse Communication: Mobility status;Precautions  Activity Tolerance: Patient tolerated treatment well Patient left: in bed;with call bell/phone within reach;with family/visitor present  OT Visit Diagnosis: Unsteadiness on feet (R26.81);Other abnormalities of gait and mobility (R26.89);Muscle weakness (generalized) (M62.81);Pain Pain - part of body:  (back)                Time: 2841-3244 OT Time Calculation (min): 30 min Charges:  OT General Charges $OT Visit: 1  Visit OT Evaluation $OT Eval Low Complexity: 1 Low OT Treatments $Self Care/Home Management : 8-22 mins  Dustyn Dansereau MSOT, OTR/L Acute Rehab Pager: [phone] Office: [place] 03/01/2021, 5:02 PM

## 2021-03-02 ENCOUNTER — Encounter (HOSPITAL_COMMUNITY): Payer: Self-pay | Admitting: Neurological Surgery

## 2021-05-01 ENCOUNTER — Ambulatory Visit: Payer: Medicare HMO | Admitting: Podiatry

## 2021-09-09 ENCOUNTER — Other Ambulatory Visit: Payer: Self-pay | Admitting: Otolaryngology

## 2021-09-12 LAB — SURGICAL PATHOLOGY

## 2021-09-13 IMAGING — DX DG SHOULDER 1V*L*
2 series · 2 of 2 positions shown · non-contrast
Comparison: None.

CLINICAL DATA: Post shoulder arthroplasty

EXAM:
LEFT SHOULDER

[shoulder ap]
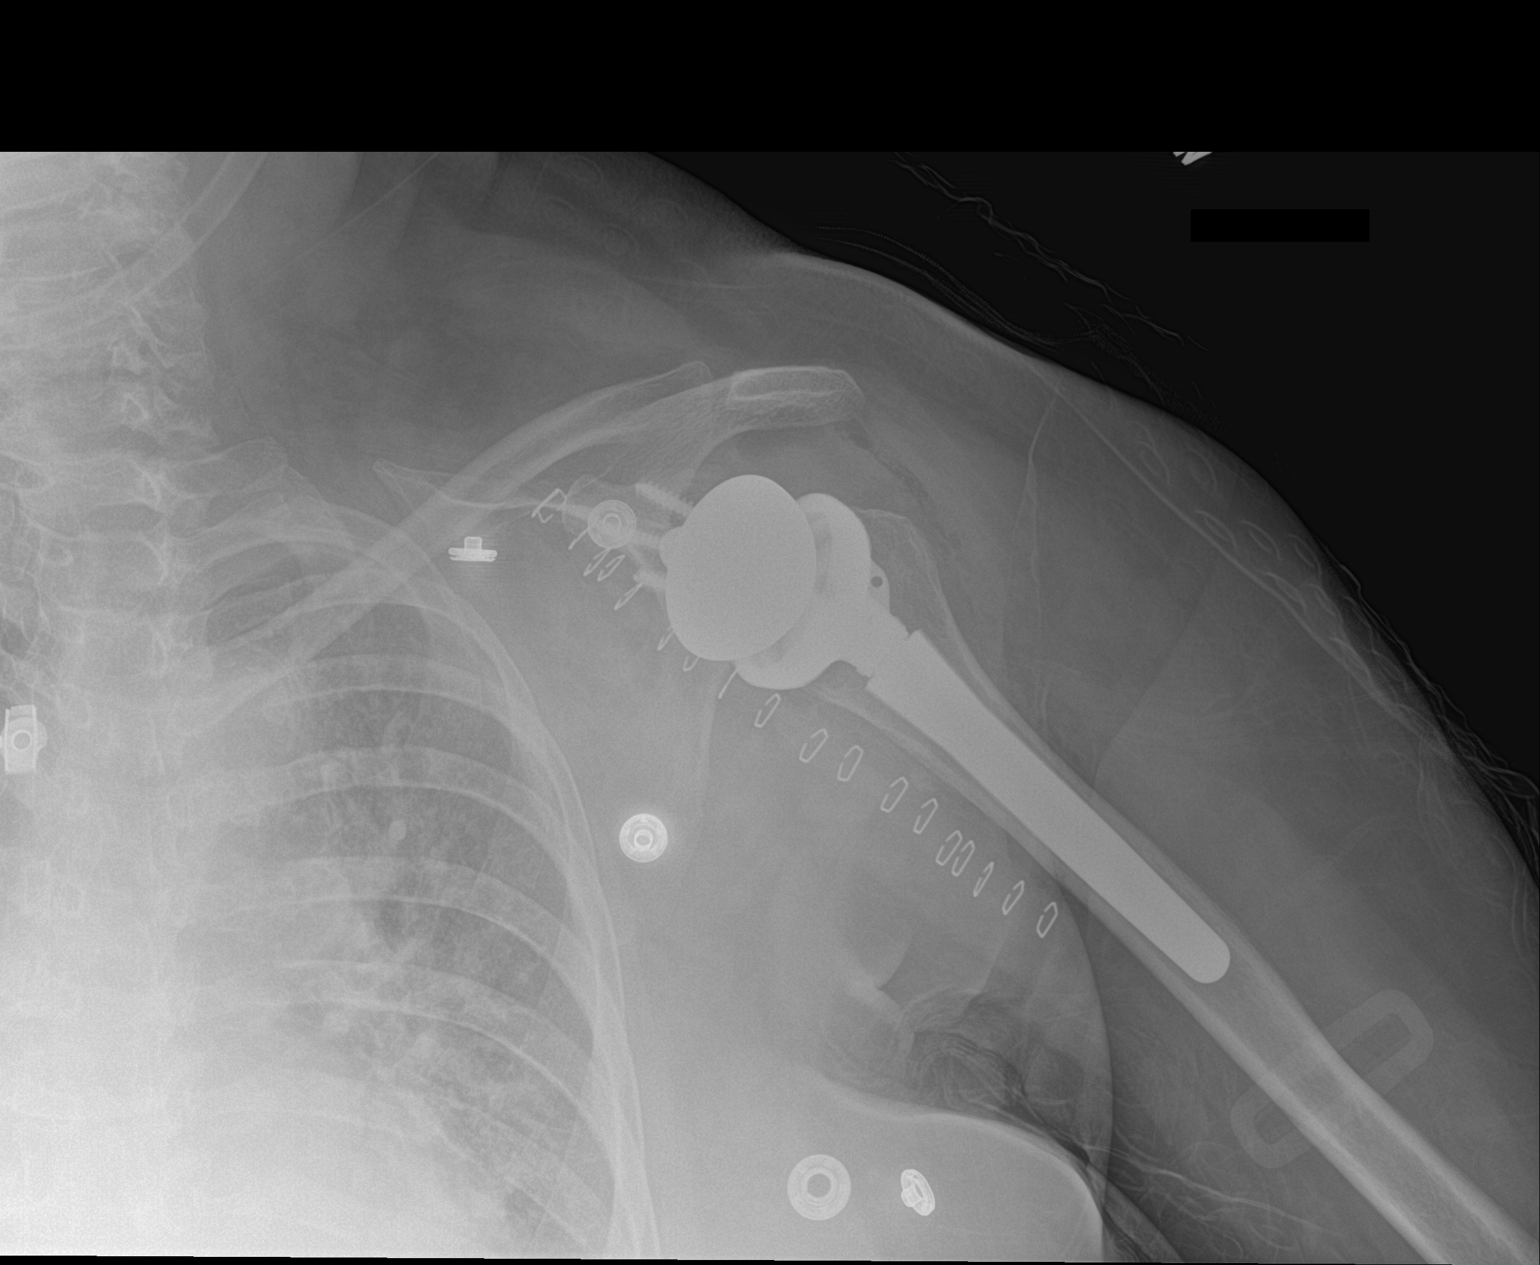

[shoulder obl]
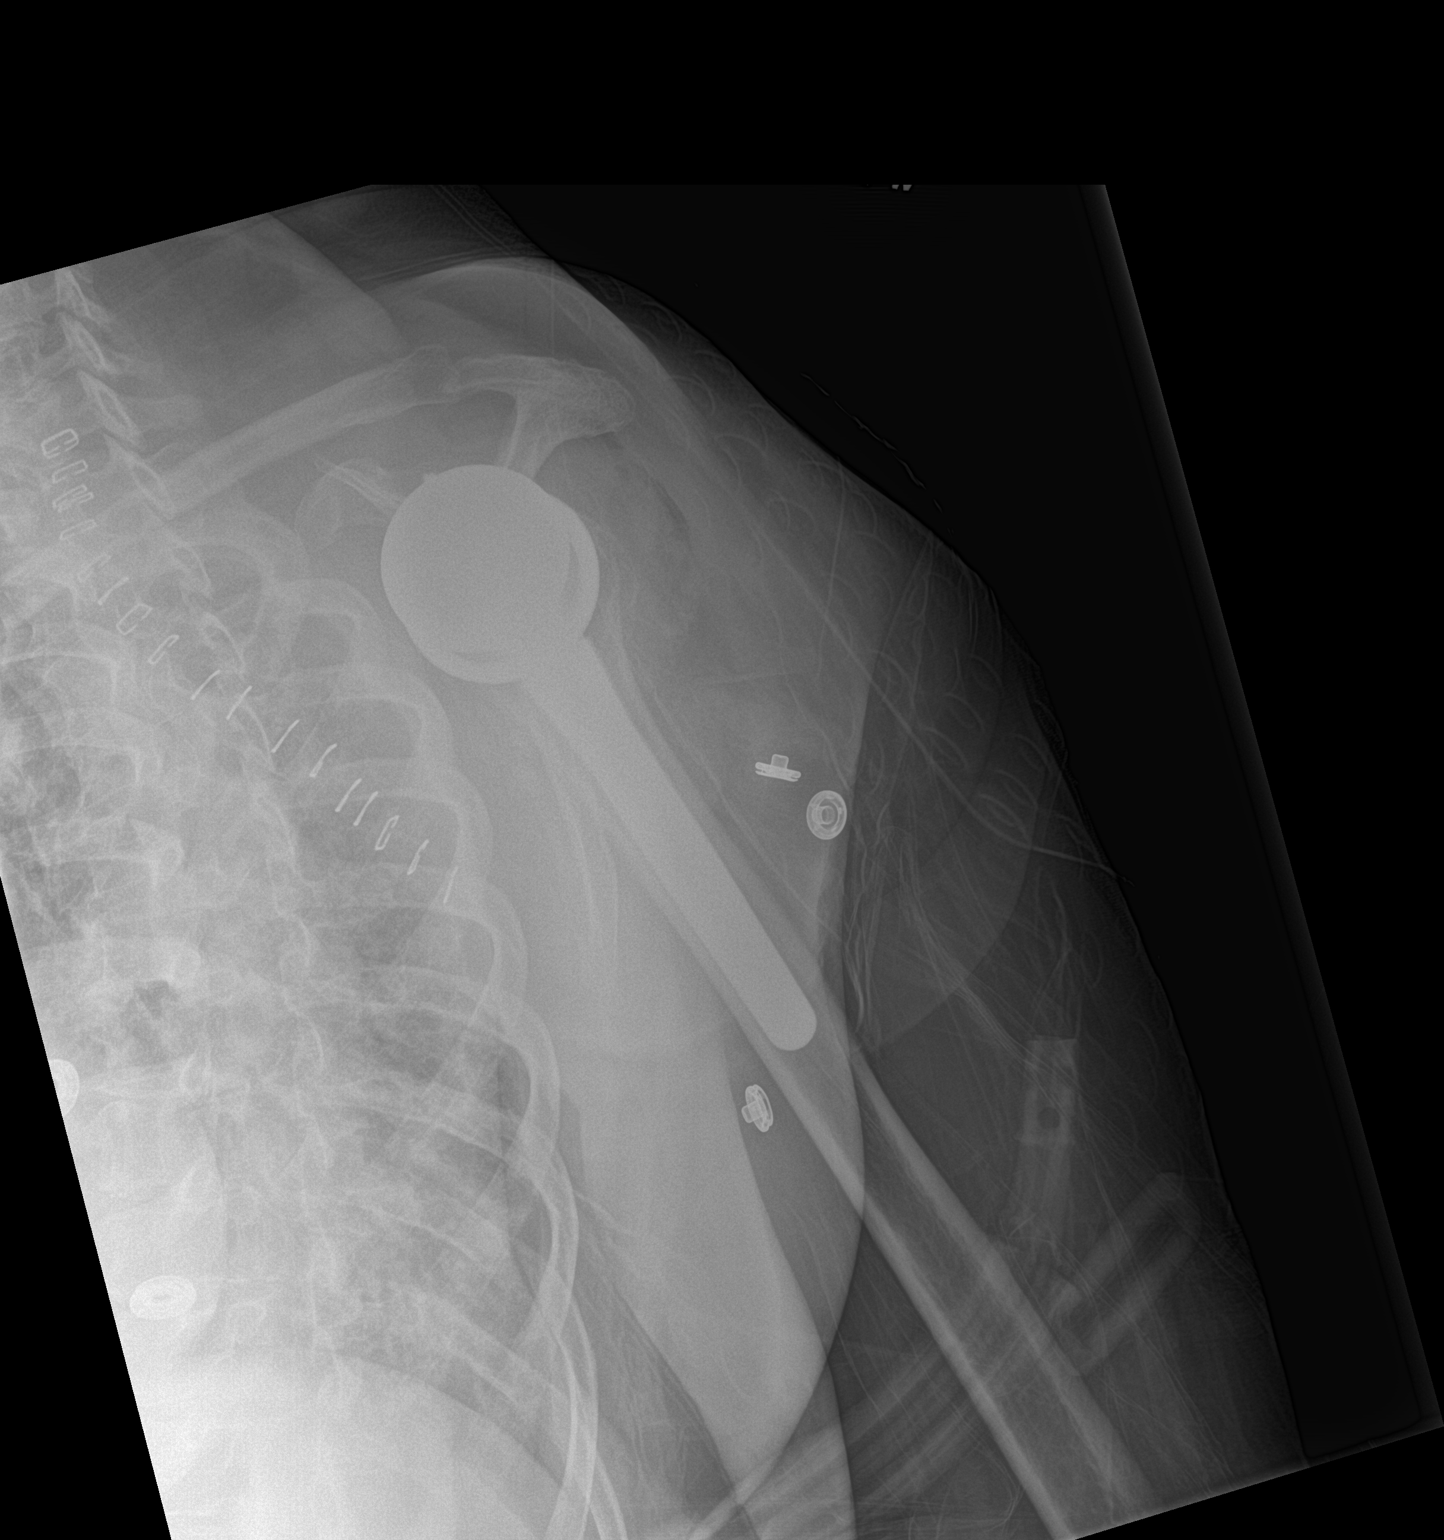

[2 of 2 positions shown; findings below may reference images not displayed]

FINDINGS: There are postoperative changes reverse left shoulder arthroplasty.
No fracture or malalignment.
IMPRESSION: Standard appearance reverse left shoulder arthroplasty.

## 2021-09-14 IMAGING — DX DG CHEST 2V
2 series · 2 of 2 positions shown · non-contrast
Comparison: Single-view shoulder from March 01, 2020

CLINICAL DATA: Difficulty breathing in a 69-year-old female

EXAM:
CHEST - 2 VIEW

[chest ap (1 of 2)]
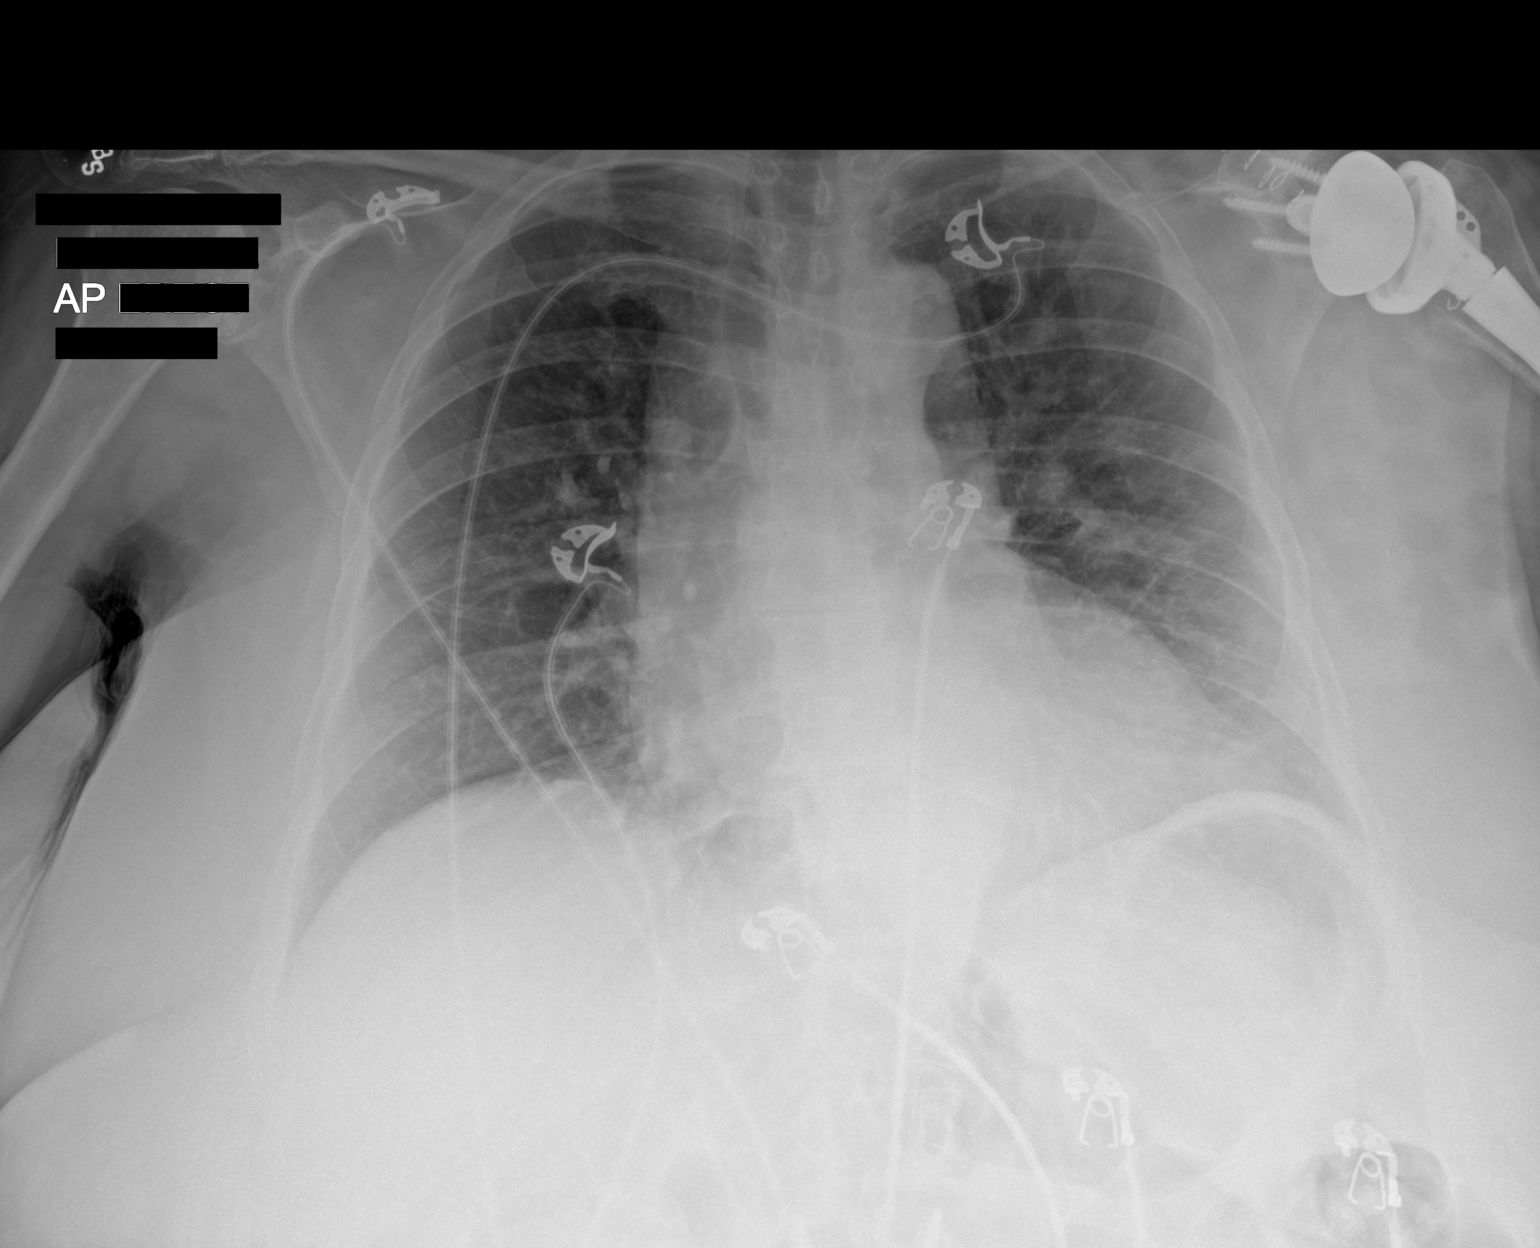

[chest ap (2 of 2)]
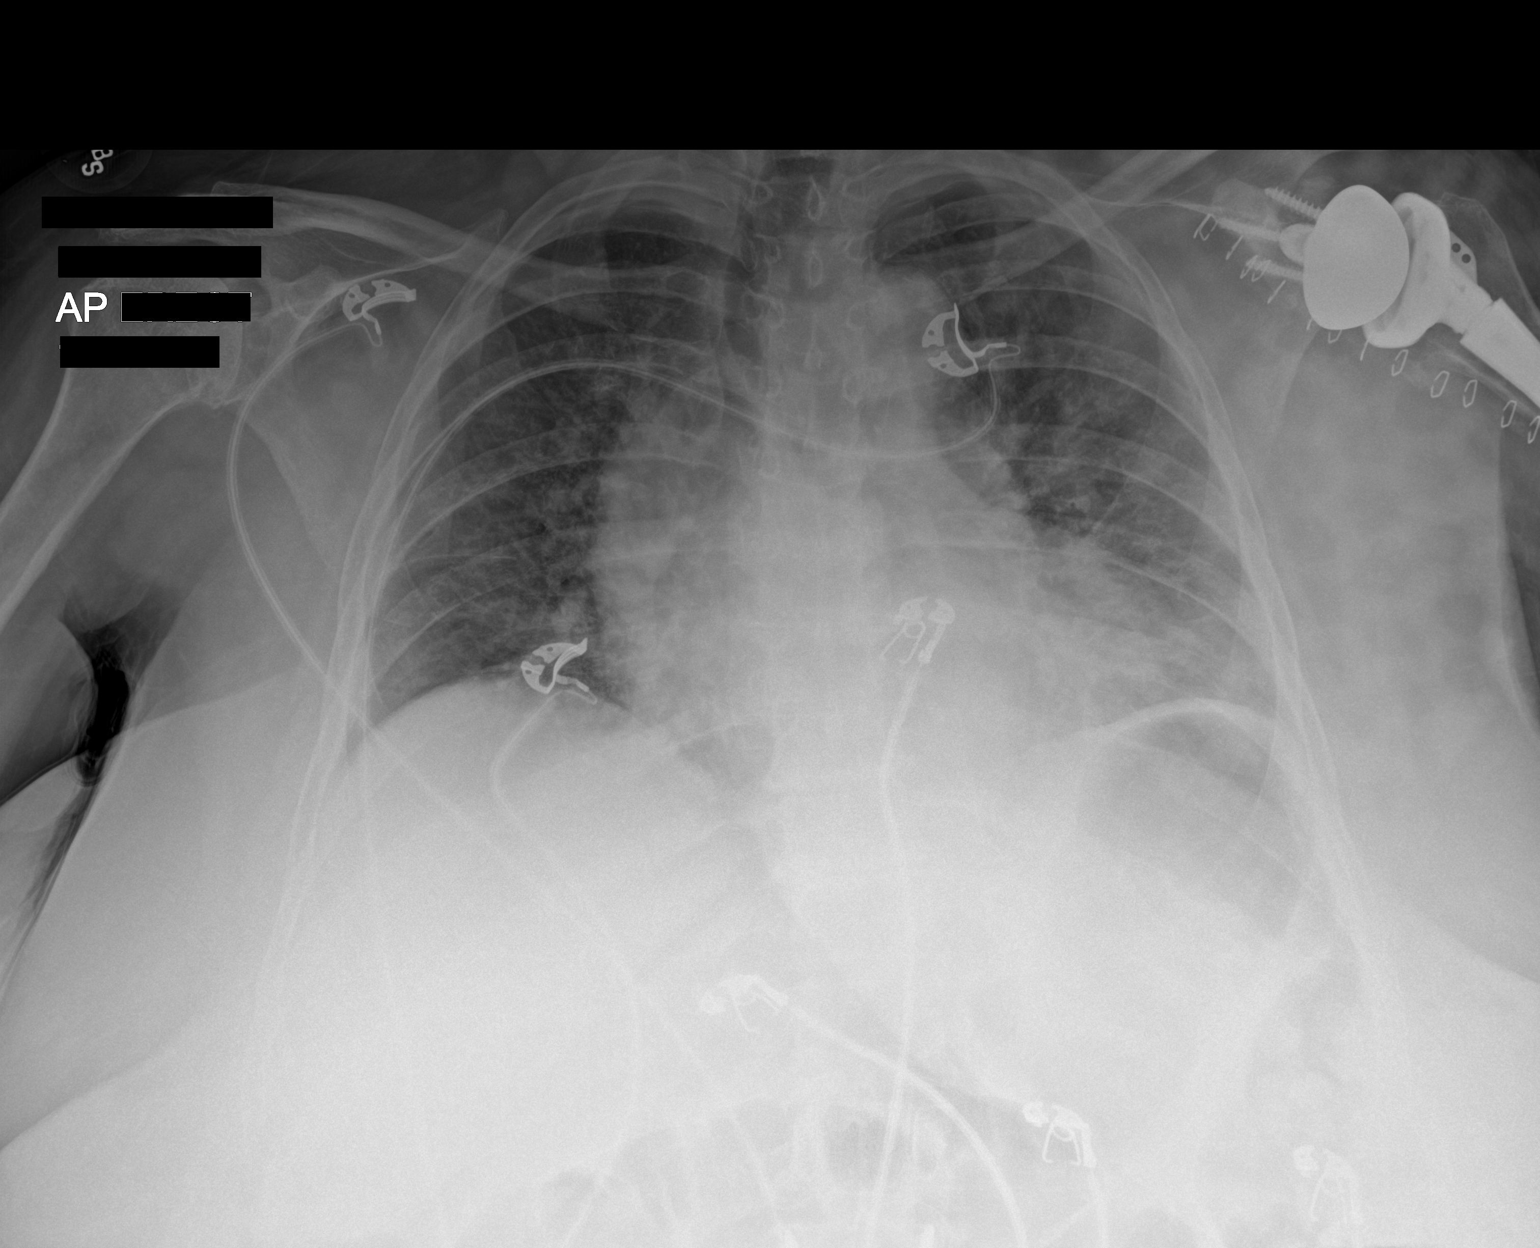

[2 of 2 positions shown; findings below may reference images not displayed]

FINDINGS: Cardiomediastinal contours with mild cardiac enlargement,
accentuated by portable technique. Mild fullness of central
pulmonary vasculature. Subtle opacities in the LEFT chest projecting
lateral to the LEFT heart border.

These have a near nodular appearance.

No lobar consolidation.  No sign of pleural effusion.

No acute skeletal process on limited assessment. Postoperative
changes related to LEFT shoulder arthroplasty.

Inspiratory and expiratory views are provided.

On expiratory views there is further opacity particularly at the
LEFT lung base and no visible pneumothorax.
IMPRESSION: 1. Subtle opacities in the LEFT chest projecting lateral to the LEFT
heart border, may represent developing pneumonia, potentially
related to viral or atypical process. Given nodular appearance would
suggest follow-up to ensure resolution.
2. Inspiratory and expiratory views show change in lung volume with
increasing opacity at the LEFT lung base likely superimposed
atelectasis.
3. Mild cardiac enlargement with central pulmonary vascular
prominence.

## 2021-11-22 ENCOUNTER — Other Ambulatory Visit: Payer: Self-pay

## 2021-11-22 ENCOUNTER — Emergency Department: Payer: Medicare HMO

## 2021-11-22 ENCOUNTER — Emergency Department
Admission: EM | Admit: 2021-11-22 | Discharge: 2021-11-22 | Disposition: A | Discharge status: Home / Self Care | Payer: Medicare HMO | Attending: Emergency Medicine | Admitting: Emergency Medicine

## 2021-11-22 DIAGNOSIS — G8929 Other chronic pain: Secondary | ICD-10-CM | POA: Diagnosis not present

## 2021-11-22 DIAGNOSIS — M545 Low back pain, unspecified: Secondary | ICD-10-CM | POA: Insufficient documentation

## 2021-11-22 LAB — CBC WITH DIFFERENTIAL/PLATELET
Abs Immature Granulocytes: 0.01 10*3/uL (ref 0.00–0.07)
Basophils Absolute: 0.1 10*3/uL (ref 0.0–0.1)
Basophils Relative: 2 %
Eosinophils Absolute: 0.2 10*3/uL (ref 0.0–0.5)
Eosinophils Relative: 3 %
HCT: 41.5 % (ref 36.0–46.0)
Hemoglobin: 13.5 g/dL (ref 12.0–15.0)
Immature Granulocytes: 0 %
Lymphocytes Relative: 26 %
Lymphs Abs: 1.4 10*3/uL (ref 0.7–4.0)
MCH: 31.5 pg (ref 26.0–34.0)
MCHC: 32.5 g/dL (ref 30.0–36.0)
MCV: 96.7 fL (ref 80.0–100.0)
Monocytes Absolute: 0.7 10*3/uL (ref 0.1–1.0)
Monocytes Relative: 12 %
Neutro Abs: 3.2 10*3/uL (ref 1.7–7.7)
Neutrophils Relative %: 57 %
Platelets: 163 10*3/uL (ref 150–400)
RBC: 4.29 MIL/uL (ref 3.87–5.11)
RDW: 13.1 % (ref 11.5–15.5)
WBC: 5.5 10*3/uL (ref 4.0–10.5)
nRBC: 0 % (ref 0.0–0.2)

## 2021-11-22 LAB — BASIC METABOLIC PANEL
Anion gap: 5 (ref 5–15)
BUN: 17 mg/dL (ref 8–23)
CO2: 28 mmol/L (ref 22–32)
Calcium: 10.1 mg/dL (ref 8.9–10.3)
Chloride: 108 mmol/L (ref 98–111)
Creatinine, Ser: 0.82 mg/dL (ref 0.44–1.00)
GFR, Estimated: >60 mL/min (ref >60)
Glucose, Bld: 108 mg/dL — ABNORMAL HIGH (ref 70–99)
Potassium: 4.2 mmol/L (ref 3.5–5.1)
Sodium: 141 mmol/L (ref 135–145)

## 2021-11-22 MED ORDER — METHYLPREDNISOLONE 4 MG PO TBPK: ORAL_TABLET | ORAL | 0 refills | Status: DC

## 2021-11-22 MED ORDER — FENTANYL CITRATE PF 50 MCG/ML IJ SOSY
50.0000 ug | PREFILLED_SYRINGE | Freq: Once | INTRAMUSCULAR | Status: AC
  Administered 2021-11-22: 50 ug via INTRAMUSCULAR
  Filled 2021-11-22: qty 1

## 2021-11-22 MED ORDER — HYDROMORPHONE HCL 1 MG/ML IJ SOLN
1.0000 mg | Freq: Once | INTRAMUSCULAR | Status: AC
  Administered 2021-11-22: 1 mg via INTRAVENOUS
  Filled 2021-11-22: qty 1

## 2021-11-22 MED ORDER — DEXAMETHASONE SODIUM PHOSPHATE 10 MG/ML IJ SOLN
10.0000 mg | Freq: Once | INTRAMUSCULAR | Status: AC
  Administered 2021-11-22: 10 mg via INTRAVENOUS
  Filled 2021-11-22: qty 1

## 2021-11-22 MED ORDER — OXYCODONE-ACETAMINOPHEN 10-325 MG PO TABS: 1.0000 | ORAL_TABLET | Freq: Four times a day (QID) | ORAL | 0 refills | Status: DC | PRN

## 2021-11-22 MED ORDER — GADOBUTROL 1 MMOL/ML IV SOLN
7.5000 mL | Freq: Once | INTRAVENOUS | Status: AC | PRN
  Administered 2021-11-22: 7.5 mL via INTRAVENOUS

## 2021-11-22 MED ORDER — ONDANSETRON 4 MG PO TBDP
4.0000 mg | ORAL_TABLET | Freq: Once | ORAL | Status: AC
  Administered 2021-11-22: 4 mg via ORAL
  Filled 2021-11-22: qty 1

## 2021-11-22 MED ORDER — LIDOCAINE 5 % EX PTCH
1.0000 | MEDICATED_PATCH | Freq: Once | CUTANEOUS | Status: DC
  Administered 2021-11-22: 1 via TRANSDERMAL
  Filled 2021-11-22: qty 1

## 2021-11-22 NOTE — ED Provider Notes (Signed)
[facility] Provider Note    Event Date/Time   First MD Initiated Contact with Patient 11/22/21 1033     (approximate)   History   Back Pain   HPI  CASANDRA DALLAIRE is a 71 y.o. female with history of chronic back pain and 2 prior back surgeries years ago presents emergency department complaining of increasing low back pain.  Patient had a fall back in May but states had no problems afterwards.  Overnight she began to have severe pain.  Did not take any over-the-counter medications.  States usually a heating pad will help.  Pain is radiating to the legs.  No abdominal pain.      Physical Exam   Triage Vital Signs: ED Triage Vitals  Enc Vitals Group     BP 11/22/21 1018 126/67     Pulse Rate 11/22/21 1018 76     Resp 11/22/21 1018 20     Temp 11/22/21 1018 98.3 F (36.8 C)     Temp Source 11/22/21 1018 Oral     SpO2 11/22/21 1018 93 %     Weight 11/22/21 1011 192 lb 14.4 oz (87.5 kg)     Height 11/22/21 1011 4\' 11"  (1.499 m)     Head Circumference --      Peak Flow --      Pain Score 11/22/21 1007 10     Pain Loc --      Pain Edu? --      Excl. in GC? --     Most recent vital signs: Vitals:   11/22/21 1018  BP: 126/67  Pulse: 76  Resp: 20  Temp: 98.3 F (36.8 C)  SpO2: 93%     General: Awake, no distress.   CV:  Good peripheral perfusion. regular rate and  rhythm Resp:  Normal effort.  Abd:  No distention.   Other:  Minimally tender along the lumbar spine, patient does appear to be in pain, neurovascular is intact   ED Results / Procedures / Treatments   Labs (all labs ordered are listed, but only abnormal results are displayed) Labs Reviewed  BASIC METABOLIC PANEL - Abnormal; Notable for the following components:      Result Value   Glucose, Bld 108 (*)    All other components within normal limits  CBC WITH DIFFERENTIAL/PLATELET     EKG     RADIOLOGY CT lumbar  spine    PROCEDURES:   Procedures   MEDICATIONS ORDERED IN ED: Medications  dexamethasone (DECADRON) injection 10 mg (has no administration in time range)  lidocaine (LIDODERM) 5 % 1 patch (has no administration in time range)  fentaNYL (SUBLIMAZE) injection 50 mcg (50 mcg Intramuscular Given 11/22/21 1057)  ondansetron (ZOFRAN-ODT) disintegrating tablet 4 mg (4 mg Oral Given 11/22/21 1057)  HYDROmorphone (DILAUDID) injection 1 mg (1 mg Intravenous Given 11/22/21 1230)  gadobutrol (GADAVIST) 1 MMOL/ML injection 7.5 mL (7.5 mLs Intravenous Contrast Given 11/22/21 1340)     IMPRESSION / MDM / ASSESSMENT AND PLAN / ED COURSE  I reviewed the triage vital signs and the nursing notes.                              Differential diagnosis includes, but is not limited to, chronic back pain, bulging disc, herniated disc, compression fracture  Patient's presentation is most consistent with severe exacerbation of chronic illness.   Patient was given fentanyl 50 mcg IM  CT lumbar spine to evaluate spine  CT lumbar spine independently reviewed and interpreted by me as having possible infection or loose screw.  Therefore since patient's pain has returned and is much worse I feel that we should do labs and MR of the lumbar spine with and without contrast.  MRI lumbar spine with and without contrast shows disc bulging at L2.  Most likely pressing on the S1 nerve.  I did discuss these findings with patient.  She was given Decadron 10 mg IV, Lidoderm patch.  I will increase her oxycodone to 10 mg as she is having to take 2 at home.  Once her pain is under control she can return to the 5 mg.  Was also given a prescription for steroid pack.  Explained to her that if she is having relief just with the steroid is going through the IV tonight she does not need to take the Dosepak.  She is in agreement treatment plan.  She is to follow-up with her regular neurosurgeon.  Discharged in stable  condition.   FINAL CLINICAL IMPRESSION(S) / ED DIAGNOSES   Final diagnoses:  Acute exacerbation of chronic low back pain     Rx / DC Orders   ED Discharge Orders          Ordered    methylPREDNISolone (MEDROL DOSEPAK) 4 MG TBPK tablet        11/22/21 1447    oxyCODONE-acetaminophen (PERCOCET) 10-325 MG tablet  Every 6 hours PRN        11/22/21 1447             Note:  This document was prepared using Dragon voice recognition software and may include unintentional dictation errors.    Faythe Ghee, PA-C 11/22/21 1452    Arnaldo Natal, MD 11/22/21 [phone]

## 2021-11-22 NOTE — ED Notes (Signed)
See triage note  Presents with lower back pain   states pain is radiating into both hips/legs  States hx of same   Pain became worse last pm  Denies any injury

## 2021-11-22 NOTE — ED Triage Notes (Signed)
Pt c/o lower back pain that radiates into BL LE for several months, states since last night the pain has worsened, states she normally uses a heating pad and it helps

## 2021-11-22 NOTE — Discharge Instructions (Signed)
Only take either the 5mg  oxycodone OR the 10mg  percocet, do not take both Use miralax to prevent constipation Steroid pack start tomorrow if still having pain Follow up with your regular back surgeon

## 2022-02-24 ENCOUNTER — Ambulatory Visit: Payer: Medicare HMO | Attending: Neurological Surgery

## 2022-02-24 DIAGNOSIS — M545 Low back pain, unspecified: Secondary | ICD-10-CM | POA: Diagnosis present

## 2022-02-24 DIAGNOSIS — R42 Dizziness and giddiness: Secondary | ICD-10-CM | POA: Insufficient documentation

## 2022-02-24 DIAGNOSIS — G8929 Other chronic pain: Secondary | ICD-10-CM | POA: Insufficient documentation

## 2022-02-24 DIAGNOSIS — R262 Difficulty in walking, not elsewhere classified: Secondary | ICD-10-CM | POA: Insufficient documentation

## 2022-02-24 DIAGNOSIS — M6281 Muscle weakness (generalized): Secondary | ICD-10-CM | POA: Diagnosis present

## 2022-02-24 DIAGNOSIS — M5417 Radiculopathy, lumbosacral region: Secondary | ICD-10-CM | POA: Insufficient documentation

## 2022-02-25 NOTE — Therapy (Incomplete)
OUTPATIENT PHYSICAL THERAPY TREATMENT NOTE   Patient Name: Jessica Cole MRN: [id number] DOB:11-25-1950, 71 y.o., female Today's Date: 02/26/2022  PCP: Lynnea Ferrier, MD REFERRING PROVIDER: Tia Alert, MD   PT End of Session - 02/26/22 [phone]     Visit Number 2    Number of Visits 12    Date for PT Re-Evaluation 04/07/22    Authorization Type [employer]    Authorization Time Period 02/24/22-04/07/22    Progress Note Due on Visit 10    PT Start Time 0802    PT Stop Time 0845    PT Time Calculation (min) 43 min    Activity Tolerance Patient limited by pain    Behavior During Therapy [facility] for tasks assessed/performed             Past Medical History:  Diagnosis Date   Anxiety    Asthma    seasonal with allergies   Colon polyps    Degenerative arthritis    Depression    Diabetes mellitus without complication (HCC)    Type II   Environmental allergies    Family history of adverse reaction to anesthesia    sister- PONV   Fatty liver    Glaucoma    Headache(784.0)    HX  MIGRAINES   History of bronchitis    History of kidney stones    History of pneumonia    Hypertension    Hypothyroidism    Obesity    OSA on CPAP    Plantar fasciitis    right   PONV (postoperative nausea and vomiting)    pt has never had any anesthesia complications, no post-op nausea or vomiting   Renal calculi    Renal calculi    Rheumatoid arthritis (HCC)    RLS (restless legs syndrome) 08/23/2014     Past Surgical History:  Procedure Laterality Date   ABDOMINAL HYSTERECTOMY     partial   APPENDECTOMY     Arthroscopic surgery, knee Left    BREAST BIOPSY Right    several   BREAST BIOPSY Right 03/11/2017   u/s bx neg   BUNIONECTOMY     LEFT    COLONOSCOPY W/ POLYPECTOMY     COLONOSCOPY WITH PROPOFOL N/A 08/14/2017   Procedure: COLONOSCOPY WITH PROPOFOL;  Surgeon: Christena Deem, MD;  Location: [facility] ENDOSCOPY;  Service: Endoscopy;  Laterality: N/A;    EXTRACORPOREAL SHOCK WAVE LITHOTRIPSY Left 05/14/2017   Procedure: EXTRACORPOREAL SHOCK WAVE LITHOTRIPSY (ESWL);  Surgeon: Riki Altes, MD;  Location: [facility] ORS;  Service: Urology;  Laterality: Left;   EXTRACORPOREAL SHOCK WAVE LITHOTRIPSY Left 06/14/2020   Procedure: EXTRACORPOREAL SHOCK WAVE LITHOTRIPSY (ESWL);  Surgeon: Sondra Come, MD;  Location: [facility] ORS;  Service: Urology;  Laterality: Left;   FOOT SURGERY     RIGHT     KNEE ARTHROSCOPY W/ MENISCAL REPAIR Left    LASIK     LUMBAR LAMINECTOMY/DECOMPRESSION MICRODISCECTOMY Right 03/01/2021   Procedure: Microdiscectomy - Lumbar five-Sacral one - right;  Surgeon: Tia Alert, MD;  Location: [facility] OR;  Service: Neurosurgery;  Laterality: Right;   MAXIMUM ACCESS (MAS)POSTERIOR LUMBAR INTERBODY FUSION (PLIF) 1 LEVEL N/A 04/25/2016   Procedure: LUMBAR FOUR-FIVE  MAXIMUM ACCESS (MAS) POSTERIOR LUMBAR INTERBODY FUSION (PLIF) with extension of instrumentation LUMBAR TWO-FIVE;  Surgeon: Tia Alert, MD;  Location: [facility] OR;  Service: Neurosurgery;  Laterality: N/A;   MAXIMUM ACCESS (MAS)POSTERIOR LUMBAR INTERBODY FUSION (PLIF) 2 LEVEL N/A 12/13/2015   Procedure: Lumbar  two-three - Lumbar three-four MAXIMUM ACCESS (MAS) POSTERIOR LUMBAR INTERBODY FUSION (PLIF)  ;  Surgeon: Tia Alert, MD;  Location: [facility] NEURO ORS;  Service: Neurosurgery;  Laterality: N/A;   REVERSE SHOULDER ARTHROPLASTY Left 03/01/2020   Procedure: REVERSE SHOULDER ARTHROPLASTY;  Surgeon: Christena Flake, MD;  Location: [facility] ORS;  Service: Orthopedics;  Laterality: Left;   SHOULDER SURGERY     RIGHT    TONSILLECTOMY     Patient Active Problem List   Diagnosis Date Noted   S/P lumbar laminectomy 03/01/2021   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) 01/03/2019   Chronic cough 11/19/2016   Morbid obesity (HCC) 09/26/2016   Left ovarian cyst 05/30/2016   S/P lumbar spinal fusion 12/13/2015    Aortic calcification (HCC) 07/18/2015   Controlled type 2 diabetes mellitus without complication (HCC) 05/16/2015   Thrombocytopenia (HCC) 05/16/2015   Recurrent major depressive disorder, in full remission (HCC) 05/16/2015   Essential (primary) hypertension 05/16/2015   Fatty infiltration of liver 03/15/2015   Aortic valve stenosis, nonrheumatic 01/18/2015   Sciatica of right side 01/09/2015   Type 2 diabetes mellitus (HCC) 11/10/2014   RLS (restless legs syndrome) 08/23/2014   Neuritis or radiculitis due to rupture of lumbar intervertebral disc 06/13/2014   Degeneration of intervertebral disc of lumbar region 06/13/2014   Lumbar radiculitis 06/13/2014   Arthritis 01/23/2014   Arthritis of knee, degenerative 11/15/2013   Depression 09/29/2013   Insomnia 09/29/2013   Apnea, sleep 08/29/2013   History of nephrolithiasis 08/29/2013   Abnormal presence of protein in urine 08/29/2013   Headache, migraine 08/29/2013   BP (high blood pressure) 08/29/2013   HLD (hyperlipidemia) 08/29/2013   Allergic rhinitis 08/29/2013   Intractable migraine without aura 02/18/2013    REFERRING DIAG: Radiculopathy, Lumbosacral Region  THERAPY DIAG:   Chronic right-sided low back pain, unspecified whether sciatica present   Radiculopathy, lumbosacral region   Difficulty in walking, not elsewhere classified  Difficulty in walking, not elsewhere classified  Muscle weakness (generalized)  Chronic right-sided low back pain, unspecified whether sciatica present  Radiculopathy, lumbosacral region  Rationale for Evaluation and Treatment Rehabilitation  PERTINENT HISTORY:  Pt reports chronic low back pain with radiation into the right hip and Rt posterior leg. She is a limited historian due to memory recall deficits. Lumbar surgery has been recommended, however insurance is mandating a 6 week trial of PT. Lumbar surgical history includes: PLIF L4-5, L2-08 Apr 2016, L2/3 & L3/07 Dec 2015, Rt L5-S1  Microdiskectomy/laminectomy 03/01/21  PRECAUTIONS: Fall Risk   SUBJECTIVE:   SUBJECTIVE STATEMENT: Pt notes that she is sleepy today.  PAIN:  Are you having pain? Yes: NPRS scale: 1/10 Pain location: R Lateral Thigh Pain description: [place]    TODAY'S TREATMENT:  LE MMT, reassess hip ROM to compare to last year     01/01/21 (Visit 2)  (Reassessment week 4)   Hip PROM      Right Hip Flexion 114 (increased left lumbar pain) 112 (Increased pain in hamstrings)    Right Hip External Rotation  60  42    Right Hip Internal Rotation  24  20    Right Hip ABduction 23 (Rt buttocks pain at end-range)  34 (R buttocks pain)    Left Hip Flexion 105 (Rt low back pain)  100 deg (Rt low back pain    Left Hip External Rotation  55  52    Left Hip Internal Rotation  23  16    Left Hip ABduction 24 (Rt calf pain, worse with P/SLR and ankle DF)  31          BLE Strength Assessment (visit 2)         Right  Left  Hip Flexion 4  4-  Hip Horizontal ABDCT  4- 4-   Hip Horizontal ADD 4 4   Hip IR (seated)   4 4   Hip ER (seated)  4+  4+  Knee Extension  5 5  Knee Flexion (seated)  4+ 4   Ankle Dorsiflexion  5 5  Hip Extension (Prone SLR) 3+ 3+  Hip Extension (Prone BLR) 3+  3+    Scour's Test in B Hips - Minimal increase in pain    TherEx:  Supine LTR, 2x10 each side to improve lumbar ROM Supine TrA activaiton, x10 with 3 sec holds Supine marching with TrA activation, 2x10 Supine PPT, 2x10 pt noting pain in the R buttocks area spanning to the L side during the second set Supine long arc distraction of B LE's, 30 sec bouts, pt noting discomfort in the ankles when performing although attempted multiple hand holds to prevent pain, no relief felt in the hips/low back Supine bridging with glute squeeze prior to liftoff, 2x10 Supine hip adduction into  physioball, 3 sec holds, 2x10      PATIENT EDUCATION: Education details: Pt educated throughout session about proper posture and technique with exercises. Improved exercise technique, movement at target joints, use of target muscles after min to mod verbal, visual, tactile cues Person educated: Patient Education method: Explanation, Tactile cues, and Verbal cues Education comprehension: verbalized understanding, returned demonstration, verbal cues required, and tactile cues required    HOME EXERCISE PROGRAM:  Give at next visit.  Start with gentle core activation exercise.      Plan - 02/26/22 0855     Clinical Impression Statement Assessment continued with treatment session for the B LE as some of the symptoms the pt is experiencing could be deriving from hip complications/referral patterns.  After assessment, pt does not necessarily have hip complications, and is limited primarily from pain at this time.  No hard end feels felt during ROM of the LE's, just pain limited.  Pt given basic core activation exercises and would benefit from having those added as part of her HEP at next session.  Will continue to monitor painful symptoms going forward and if any progress is made over the next 6 weeks.   Pt will continue to benefit from skilled therapy to address remaining deficits in order to improve overall QoL and return to PLOF.         Personal Factors and Comorbidities Comorbidity 3+;Age;Fitness;Past/Current Experience;Time since onset of injury/illness/exacerbation    Comorbidities memory impairment, anxiety, depression, DM,  hx of kidney stones, obesity, RA    Examination-Activity Limitations Bed Mobility;Stand;Lift;Locomotion Level;Bend;Transfers;Carry;Continence;Sit;Sleep;Stairs;Squat    Examination-Participation Restrictions Church;Meal Prep;Cleaning;Community Activity;Driving;Interpersonal Relationship;Laundry;Yard Work    Conservation officer, historic buildings Evolving/Moderate  complexity    Rehab Potential Poor    PT Frequency 2x / week    PT Duration 6 weeks    PT Treatment/Interventions Balance training;Neuromuscular re-education;Therapeutic activities;Patient/family education;Manual techniques;Therapeutic exercise;Electrical Stimulation;Cryotherapy;Moist Heat;Stair training;Functional mobility training;DME Instruction;Gait training;Passive range of motion;Dry needling    PT Next Visit Plan LE MMT, reassess hip ROM to compare to last year    PT Home Exercise Plan deferred until further examination can be completed    Consulted and Agree with Plan of Care Patient                 PT Short Term Goals - 02/25/22 0903       PT SHORT TERM GOAL #1   Title Pt will be independent with her initial HEP to decrease pain, improve strength, function, and ability to perform transfers more comfortably and with less difficulty.    Time 2    Period Weeks    Status New    Target Date 03/11/22      PT SHORT TERM GOAL #2   Title Pt will demonstrate improved tolerance to overground AMB by> 52ft without pain limitation:    Baseline eval: <384ft    Time 3    Period Weeks    Status New    Target Date 03/18/22      PT SHORT TERM GOAL #3   Title Pt to demonstrate improved tolerance to power actiivties AEB 5xSTS <15 secs    Baseline Eval: 19sec    Time 3    Period Weeks    Status New    Target Date 03/18/22              PT Long Term Goals - 02/25/22 0904       PT LONG TERM GOAL #1   Title Pt to improve score on FOTO survery by >8 points to indicate improved self efficicacy in ADL/IADL mobility.    Baseline Eval: 40    Time 6    Period Weeks    Status New    Target Date 04/08/22      PT LONG TERM GOAL #2   Title Pt to demonstrate improved standing tolerance at home to >20 minutes.    Baseline eval: 5-10 minutes    Time 6    Period Weeks    Status New    Target Date 04/08/22      PT LONG TERM GOAL #3   Title Pt to demonstrate tolerance to  without need to sit during testing, performance >1080ft.    Baseline eval: tolerates less than 2 minutes of walking.    Time 6    Period Weeks    Status New    Target Date 04/08/22              Nolon Bussing, PT, DPT Physical Therapist- [facility]  02/26/22, 8:56 AM

## 2022-02-25 NOTE — Therapy (Signed)
[facility] MAIN [facility] SERVICES [address] [place], [place], [address] Phone: [phone]   Fax:  [phone]  Outpatient Physical Therapy Evaluation  Patient Details  Name: Jessica Cole MRN: [id number] Date of Birth: 1950/09/12 Referring Provider (PT): Marikay Alar MD   Encounter Date: 02/24/2022   PT End of Session - 02/25/22 0853     Visit Number 1    Number of Visits 12    Date for PT Re-Evaluation 04/07/22    Authorization Type [employer]    Authorization Time Period 02/24/22-04/07/22    Progress Note Due on Visit 10    PT Start Time 1602    PT Stop Time 1637    PT Time Calculation (min) 35 min    Activity Tolerance Patient limited by pain    Behavior During Therapy [facility] for tasks assessed/performed             Past Medical History:  Diagnosis Date   Anxiety    Asthma    seasonal with allergies   Colon polyps    Degenerative arthritis    Depression    Diabetes mellitus without complication (HCC)    Type II   Environmental allergies    Family history of adverse reaction to anesthesia    sister- PONV   Fatty liver    Glaucoma    Headache(784.0)    HX  MIGRAINES   History of bronchitis    History of kidney stones    History of pneumonia    Hypertension    Hypothyroidism    Obesity    OSA on CPAP    Plantar fasciitis    right   PONV (postoperative nausea and vomiting)    pt has never had any anesthesia complications, no post-op nausea or vomiting   Renal calculi    Renal calculi    Rheumatoid arthritis (HCC)    RLS (restless legs syndrome) 08/23/2014    Past Surgical History:  Procedure Laterality Date   ABDOMINAL HYSTERECTOMY     partial   APPENDECTOMY     Arthroscopic surgery, knee Left    BREAST BIOPSY Right    several   BREAST BIOPSY Right 03/11/2017   u/s bx neg   BUNIONECTOMY     LEFT    COLONOSCOPY W/ POLYPECTOMY     COLONOSCOPY WITH PROPOFOL N/A 08/14/2017   Procedure: COLONOSCOPY WITH  PROPOFOL;  Surgeon: Christena Deem, MD;  Location: [facility] ENDOSCOPY;  Service: Endoscopy;  Laterality: N/A;   EXTRACORPOREAL SHOCK WAVE LITHOTRIPSY Left 05/14/2017   Procedure: EXTRACORPOREAL SHOCK WAVE LITHOTRIPSY (ESWL);  Surgeon: Riki Altes, MD;  Location: [facility] ORS;  Service: Urology;  Laterality: Left;   EXTRACORPOREAL SHOCK WAVE LITHOTRIPSY Left 06/14/2020   Procedure: EXTRACORPOREAL SHOCK WAVE LITHOTRIPSY (ESWL);  Surgeon: Sondra Come, MD;  Location: [facility] ORS;  Service: Urology;  Laterality: Left;   FOOT SURGERY     RIGHT     KNEE ARTHROSCOPY W/ MENISCAL REPAIR Left    LASIK     LUMBAR LAMINECTOMY/DECOMPRESSION MICRODISCECTOMY Right 03/01/2021   Procedure: Microdiscectomy - Lumbar five-Sacral one - right;  Surgeon: Tia Alert, MD;  Location: [facility] OR;  Service: Neurosurgery;  Laterality: Right;   MAXIMUM ACCESS (MAS)POSTERIOR LUMBAR INTERBODY FUSION (PLIF) 1 LEVEL N/A 04/25/2016   Procedure: LUMBAR FOUR-FIVE  MAXIMUM ACCESS (MAS) POSTERIOR LUMBAR INTERBODY FUSION (PLIF) with extension of instrumentation LUMBAR TWO-FIVE;  Surgeon: Tia Alert, MD;  Location: [facility] OR;  Service: Neurosurgery;  Laterality:  N/A;   MAXIMUM ACCESS (MAS)POSTERIOR LUMBAR INTERBODY FUSION (PLIF) 2 LEVEL N/A 12/13/2015   Procedure: Lumbar two-three - Lumbar three-four MAXIMUM ACCESS (MAS) POSTERIOR LUMBAR INTERBODY FUSION (PLIF)  ;  Surgeon: Tia Alert, MD;  Location: [facility] NEURO ORS;  Service: Neurosurgery;  Laterality: N/A;   REVERSE SHOULDER ARTHROPLASTY Left 03/01/2020   Procedure: REVERSE SHOULDER ARTHROPLASTY;  Surgeon: Christena Flake, MD;  Location: [facility] ORS;  Service: Orthopedics;  Laterality: Left;   SHOULDER SURGERY     RIGHT    TONSILLECTOMY       There were no vitals filed for this visit.    Subjective Assessment    Subjective Pt reports conitnues to have ongoing Right sided low back pain, followed by Dr. Marikay Alar who is recommending surgery but insurance is asking pt try physical  therapy first before covering a surgery.    Pertinent History Pt reports chronic low back pain with radiation into the right hip and Rt posterior leg. She is a limited historian due to memory recall deficits. Lumbar surgery has been recommended, however insurance is mandating a 6 week trial of PT. Lumbar surgical history includes: PLIF L4-5, L2-08 Apr 2016, L2/3 & L3/07 Dec 2015, Rt L5-S1 Microdiskectomy/laminectomy 03/01/21   How long can you sit comfortably? 10 minutes    How long can you stand comfortably? unsure, memory limitations    How long can you walk comfortably? unlimited, tolerated well    Currently in Pain? Yes    Pain Score 9     Pain Location --   Right posterolateral gluteals/hip (near iliac crest)              02/24/22   Assessment  Medical Diagnosis Rt low back pain  Referring Provider (PT) Marikay Alar MD  Onset Date/Surgical Date 11/20/20  Prior Therapy Yes  Precautions  Precaution Comments Fall risk  Restrictions  Other Position/Activity Restrictions No known restrictions  Balance Screen  Has the patient fallen in the past 6 months Yes  How many times? 2  Has the patient had a decrease in activity level because of a fear of falling?  Yes  Is the patient reluctant to leave their home because of a fear of falling?  Yes  Prior Function  Level of Independence Independent with basic ADLs;Independent with community mobility with device;Independent with gait  Vocation Retired  Observation/Other Assessments  Focus on Therapeutic Outcomes (FOTO)  40  Transfers  Five time sit to stand comments  14.79 (min use of hands)  Ambulation/Gait  Ambulation Distance (Feet) 210 Feet (tolerance)  Assistive device  (none)  Gait velocity 0.38m/s      01/01/21 (Visit 2)  (Reassessment week 4)   Hip PROM    Right Hip Flexion 114 (increased left lumbar pain)    Right Hip External Rotation  60    Right Hip Internal Rotation  24    Right Hip ABduction 23 (Rt buttocks pain  at end-range)    Left Hip Flexion 105 (Rt low back pain)    Left Hip External Rotation  55    Left Hip Internal Rotation  23    Left Hip ABduction 24 (Rt calf pain, worse with P/SLR and ankle DF)      BLE Strength Assessment (visit 2)      Right  Left  Hip Flexion    Hip Horizontal ABDCT     Hip Horizontal ADD    Hip IR (seated)     Hip ER (seated)  Knee Extension    Knee Flexion (seated)    Ankle Dorsiflexion    Ankle Plantarflexion (seated)    Ankle Plantarflexion (SLS)     Hip ABDCT (supine)     Hip Extension (Supine @ 30 degrees)    Hip Extension (Prone SLR)    Hip Extension (Prone BLR)    Knee flexion (prone at 90 degrees)    Hip ER in prone    Hip IR in prone          Objective measurements completed on examination: See above findings.        PT Education -     Education Details Explained that twice weekly adherence to therapy will not be enough for optimal outcomes, pt will need to commit to adherence to home program as well and long term compliance after DC.    Person(s) Educated Patient    Methods Explanation    Comprehension Verbalized understanding;Need further instruction              PT Short Term Goals -       PT SHORT TERM GOAL #1   Title Pt will be independent with her initial HEP to decrease pain, improve strength, function, and ability to perform transfers more comfortably and with less difficulty.    Time 2    Period Weeks    Status New    Target Date 03/11/22      PT SHORT TERM GOAL #2   Title Pt will demonstrate improved tolerance to overground AMB by> 53500ft without pain limitation:    Baseline eval: <32400ft    Time 3    Period Weeks    Status New    Target Date 03/18/22      PT SHORT TERM GOAL #3   Title Pt to demonstrate improved tolerance to power actiivties AEB 5xSTS <13 secs    Baseline Eval: 14.79sec    Time 3    Period Weeks    Status New    Target Date 03/18/22               PT Long Term Goals -        PT LONG TERM GOAL #1   Title Pt to improve score on FOTO survery by >8 points to indicate improved self efficicacy in ADL/IADL mobility.    Baseline Eval: 40    Time 6    Period Weeks    Status New    Target Date 04/08/22      PT LONG TERM GOAL #2   Title Pt to demonstrate improved standing tolerance at home to >20 minutes.    Baseline eval: 5-10 minutes    Time 6    Period Weeks    Status New    Target Date 04/08/22      PT LONG TERM GOAL #3   Title Pt to demonstrate tolerance to 6MWT without need to sit during testing, performance >102800ft.    Baseline eval: tolerates less than 2 minutes of walking.    Time 6    Period Weeks    Status New    Target Date 04/08/22                    Plan - 02/25/22 0855     Clinical Impression Statement Jessica Cole is a 71yoF who presents to OPPT for acute on chronic exacerbation of low back pain with symptoms into legs. Pt has been seen in the past extensively for this problem  with limited progress due to severeity and sensitivity of symptoms. Pt had reached a plateau with therapy abotu 1 years ago, but cannont provide much interval history at this time due to self reported memory recall impairment. Pt reports inability to participate in social activities and perform housework as she would like. Examination shows very poor tolerance to prolonged sitting, standing, and walking. She is followed by Dr. Sherley Bounds who as indicated a need for spine surgery, however her insurance would like her to trial physical therapy for 6 weeks prior to elligibility for this procedure. Pt will benefit from skilled PT intervention to maximize pt's ability to remain independent in ADL performance, restore tolerance to IADL performance, and allow for return to social activity.    Personal Factors and Comorbidities Comorbidity 3+;Age;Fitness;Past/Current Experience;Time since onset of injury/illness/exacerbation    Comorbidities memory impairment, anxiety,  depression, DM, hx of kidney stones, obesity, RA    Examination-Activity Limitations Bed Mobility;Stand;Lift;Locomotion Level;Bend;Transfers;Carry;Continence;Sit;Sleep;Stairs;Squat    Examination-Participation Restrictions Church;Meal Prep;Cleaning;Community Activity;Driving;Interpersonal Relationship;Laundry;Yard Work    Engineer, water    Rehab Potential Poor    PT Frequency 2x / week    PT Duration 6 weeks    PT Treatment/Interventions Balance training;Neuromuscular re-education;Therapeutic activities;Patient/family education;Manual techniques;Therapeutic exercise;Electrical Stimulation;Cryotherapy;Moist Heat;Stair training;Functional mobility training;DME Instruction;Gait training;Passive range of motion;Dry needling    PT Next Visit Plan LE MMT, reassess hip ROM to compare to last year    PT Home Exercise Plan deferred until further examination can be completed    Consulted and Agree with Plan of Care Patient             Patient will benefit from skilled therapeutic intervention in order to improve the following deficits and impairments:  Pain, Improper body mechanics, Postural dysfunction, Decreased strength, Decreased balance  Visit Diagnosis: Chronic right-sided low back pain, unspecified whether sciatica present  Radiculopathy, lumbosacral region  Difficulty in walking, not elsewhere classified     Problem List Patient Active Problem List   Diagnosis Date Noted   S/P lumbar laminectomy 03/01/2021   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 edema associated with type 2 diabetes mellitus ([place]) 01/03/2019   Chronic cough 11/19/2016   Morbid obesity ([place]) 09/26/2016   Left ovarian cyst 05/30/2016   S/P lumbar spinal fusion 12/13/2015   Aortic calcification ([place]) 07/18/2015   Controlled type 2 diabetes mellitus without complication ([place]) 23/76/2831    Thrombocytopenia ([place]) 05/16/2015   Recurrent major depressive disorder, in full remission ([place]) 05/16/2015   Essential (primary) hypertension 05/16/2015   Fatty infiltration of liver 03/15/2015   Aortic valve stenosis, nonrheumatic 01/18/2015   Sciatica of right side 01/09/2015   Type 2 diabetes mellitus ([place]) 11/10/2014   RLS (restless legs syndrome) 08/23/2014   Neuritis or radiculitis due to rupture of lumbar intervertebral disc 06/13/2014   Degeneration of intervertebral disc of lumbar region 06/13/2014   Lumbar radiculitis 06/13/2014   Arthritis 01/23/2014   Arthritis of knee, degenerative 11/15/2013   Depression 09/29/2013   Insomnia 09/29/2013   Apnea, sleep 08/29/2013   History of nephrolithiasis 08/29/2013   Abnormal presence of protein in urine 08/29/2013   Headache, migraine 08/29/2013   BP (high blood pressure) 08/29/2013   HLD (hyperlipidemia) 08/29/2013   Allergic rhinitis 08/29/2013   Intractable migraine without aura 02/18/2013    9:16 AM, 02/25/22 Etta Grandchild, PT, DPT Physical Therapist - [place]  [phone]     Rosamaria Lints, PT 02/25/2022, 9:08 AM  [facility] MAIN [facility] SERVICES [address] [place], [place], [address] Phone: [phone]   Fax:  [phone]  Name: Jessica Cole MRN: [id number] Date of Birth: Jun 28, 1950

## 2022-02-26 ENCOUNTER — Ambulatory Visit: Payer: Medicare HMO

## 2022-02-26 DIAGNOSIS — M5417 Radiculopathy, lumbosacral region: Secondary | ICD-10-CM

## 2022-02-26 DIAGNOSIS — M6281 Muscle weakness (generalized): Secondary | ICD-10-CM

## 2022-02-26 DIAGNOSIS — M545 Low back pain, unspecified: Secondary | ICD-10-CM | POA: Diagnosis not present

## 2022-02-26 DIAGNOSIS — G8929 Other chronic pain: Secondary | ICD-10-CM

## 2022-02-26 DIAGNOSIS — R262 Difficulty in walking, not elsewhere classified: Secondary | ICD-10-CM

## 2022-02-27 ENCOUNTER — Ambulatory Visit: Payer: Medicare HMO

## 2022-03-04 ENCOUNTER — Ambulatory Visit: Payer: Medicare HMO

## 2022-03-04 DIAGNOSIS — G8929 Other chronic pain: Secondary | ICD-10-CM

## 2022-03-04 DIAGNOSIS — M545 Low back pain, unspecified: Secondary | ICD-10-CM | POA: Diagnosis not present

## 2022-03-04 DIAGNOSIS — M6281 Muscle weakness (generalized): Secondary | ICD-10-CM

## 2022-03-04 DIAGNOSIS — R42 Dizziness and giddiness: Secondary | ICD-10-CM

## 2022-03-04 NOTE — Therapy (Signed)
OUTPATIENT PHYSICAL THERAPY TREATMENT NOTE   Patient Name: Jessica Cole MRN: [id number] DOB:1951-01-22, 71 y.o., female Today's Date: 03/04/2022  PCP: Lynnea Ferrier, MD REFERRING PROVIDER: Tia Alert, MD   PT End of Session - 03/04/22 1257     Visit Number 3    Number of Visits 12    Date for PT Re-Evaluation 04/07/22    Authorization Type [employer]    Authorization Time Period 02/24/22-04/07/22    Progress Note Due on Visit 10    PT Start Time 0846    PT Stop Time 0929    PT Time Calculation (min) 43 min    Activity Tolerance Patient tolerated treatment well;Patient limited by pain    Behavior During Therapy [facility] for tasks assessed/performed              Past Medical History:  Diagnosis Date   Anxiety    Asthma    seasonal with allergies   Colon polyps    Degenerative arthritis    Depression    Diabetes mellitus without complication (HCC)    Type II   Environmental allergies    Family history of adverse reaction to anesthesia    sister- PONV   Fatty liver    Glaucoma    Headache(784.0)    HX  MIGRAINES   History of bronchitis    History of kidney stones    History of pneumonia    Hypertension    Hypothyroidism    Obesity    OSA on CPAP    Plantar fasciitis    right   PONV (postoperative nausea and vomiting)    pt has never had any anesthesia complications, no post-op nausea or vomiting   Renal calculi    Renal calculi    Rheumatoid arthritis (HCC)    RLS (restless legs syndrome) 08/23/2014     Past Surgical History:  Procedure Laterality Date   ABDOMINAL HYSTERECTOMY     partial   APPENDECTOMY     Arthroscopic surgery, knee Left    BREAST BIOPSY Right    several   BREAST BIOPSY Right 03/11/2017   u/s bx neg   BUNIONECTOMY     LEFT    COLONOSCOPY W/ POLYPECTOMY     COLONOSCOPY WITH PROPOFOL N/A 08/14/2017   Procedure: COLONOSCOPY WITH PROPOFOL;  Surgeon: Christena Deem, MD;  Location: [facility] ENDOSCOPY;  Service: Endoscopy;   Laterality: N/A;   EXTRACORPOREAL SHOCK WAVE LITHOTRIPSY Left 05/14/2017   Procedure: EXTRACORPOREAL SHOCK WAVE LITHOTRIPSY (ESWL);  Surgeon: Riki Altes, MD;  Location: [facility] ORS;  Service: Urology;  Laterality: Left;   EXTRACORPOREAL SHOCK WAVE LITHOTRIPSY Left 06/14/2020   Procedure: EXTRACORPOREAL SHOCK WAVE LITHOTRIPSY (ESWL);  Surgeon: Sondra Come, MD;  Location: [facility] ORS;  Service: Urology;  Laterality: Left;   FOOT SURGERY     RIGHT     KNEE ARTHROSCOPY W/ MENISCAL REPAIR Left    LASIK     LUMBAR LAMINECTOMY/DECOMPRESSION MICRODISCECTOMY Right 03/01/2021   Procedure: Microdiscectomy - Lumbar five-Sacral one - right;  Surgeon: Tia Alert, MD;  Location: [facility] OR;  Service: Neurosurgery;  Laterality: Right;   MAXIMUM ACCESS (MAS)POSTERIOR LUMBAR INTERBODY FUSION (PLIF) 1 LEVEL N/A 04/25/2016   Procedure: LUMBAR FOUR-FIVE  MAXIMUM ACCESS (MAS) POSTERIOR LUMBAR INTERBODY FUSION (PLIF) with extension of instrumentation LUMBAR TWO-FIVE;  Surgeon: Tia Alert, MD;  Location: [facility] OR;  Service: Neurosurgery;  Laterality: N/A;   MAXIMUM ACCESS (MAS)POSTERIOR LUMBAR INTERBODY FUSION (PLIF) 2 LEVEL N/A 12/13/2015  Procedure: Lumbar two-three - Lumbar three-four MAXIMUM ACCESS (MAS) POSTERIOR LUMBAR INTERBODY FUSION (PLIF)  ;  Surgeon: Tia Alert, MD;  Location: [facility] NEURO ORS;  Service: Neurosurgery;  Laterality: N/A;   REVERSE SHOULDER ARTHROPLASTY Left 03/01/2020   Procedure: REVERSE SHOULDER ARTHROPLASTY;  Surgeon: Christena Flake, MD;  Location: [facility] ORS;  Service: Orthopedics;  Laterality: Left;   SHOULDER SURGERY     RIGHT    TONSILLECTOMY     Patient Active Problem List   Diagnosis Date Noted   S/P lumbar laminectomy 03/01/2021   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) 01/03/2019   Chronic cough 11/19/2016   Morbid obesity (HCC) 09/26/2016   Left ovarian cyst 05/30/2016   S/P lumbar spinal  fusion 12/13/2015   Aortic calcification (HCC) 07/18/2015   Controlled type 2 diabetes mellitus without complication (HCC) 05/16/2015   Thrombocytopenia (HCC) 05/16/2015   Recurrent major depressive disorder, in full remission (HCC) 05/16/2015   Essential (primary) hypertension 05/16/2015   Fatty infiltration of liver 03/15/2015   Aortic valve stenosis, nonrheumatic 01/18/2015   Sciatica of right side 01/09/2015   Type 2 diabetes mellitus (HCC) 11/10/2014   RLS (restless legs syndrome) 08/23/2014   Neuritis or radiculitis due to rupture of lumbar intervertebral disc 06/13/2014   Degeneration of intervertebral disc of lumbar region 06/13/2014   Lumbar radiculitis 06/13/2014   Arthritis 01/23/2014   Arthritis of knee, degenerative 11/15/2013   Depression 09/29/2013   Insomnia 09/29/2013   Apnea, sleep 08/29/2013   History of nephrolithiasis 08/29/2013   Abnormal presence of protein in urine 08/29/2013   Headache, migraine 08/29/2013   BP (high blood pressure) 08/29/2013   HLD (hyperlipidemia) 08/29/2013   Allergic rhinitis 08/29/2013   Intractable migraine without aura 02/18/2013    REFERRING DIAG: Radiculopathy, Lumbosacral Region  THERAPY DIAG:   Chronic right-sided low back pain, unspecified whether sciatica present   Radiculopathy, lumbosacral region   Difficulty in walking, not elsewhere classified  Chronic bilateral low back pain, unspecified whether sciatica present  Muscle weakness (generalized)  Dizziness and giddiness  Rationale for Evaluation and Treatment Rehabilitation  PERTINENT HISTORY:  Pt reports chronic low back pain with radiation into the right hip and Rt posterior leg. She is a limited historian due to memory recall deficits. Lumbar surgery has been recommended, however insurance is mandating a 6 week trial of PT. Lumbar surgical history includes: PLIF L4-5, L2-08 Apr 2016, L2/3 & L3/07 Dec 2015, Rt L5-S1 Microdiskectomy/laminectomy  03/01/21  PRECAUTIONS: Fall Risk   SUBJECTIVE:   SUBJECTIVE STATEMENT: Pt rates pain as 2-3/10 across her low back. She is worried about falling due to pain in her legs. Pt has not fallen recently but has stumbled, "I don't pick my feet up like I should."  Pt has been using ice and heat at home for pain. Mostly using the heating pad. Pt also continues to experience brief vertigo with rolling over in her bed and certain head turns. Reports she has had this in past and been treated for it 2x before.   PAIN:  Are you having pain? Yes: NPRS scale: 2-3/10 Pain location: R Lateral Thigh Pain description: Achey    OBJECTIVE -taken previous sessions  LE MMT, reassess hip ROM to compare to last year     01/01/21 (Visit 2)  (Reassessment week 4)   Hip PROM      Right Hip Flexion 114 (increased left lumbar pain) 112 (Increased pain in hamstrings)    Right Hip External Rotation  60  42    Right Hip Internal Rotation  24  20    Right Hip ABduction 23 (Rt buttocks pain at end-range)  34 (R buttocks pain)    Left Hip Flexion 105 (Rt low back pain)  100 deg (Rt low back pain    Left Hip External Rotation  55  52    Left Hip Internal Rotation  23  16    Left Hip ABduction 24 (Rt calf pain, worse with P/SLR and ankle DF)  31          BLE Strength Assessment (visit 2)         Right  Left  Hip Flexion 4  4-  Hip Horizontal ABDCT  4- 4-   Hip Horizontal ADD 4 4   Hip IR (seated)   4 4   Hip ER (seated)  4+  4+  Knee Extension  5 5  Knee Flexion (seated)  4+ 4   Ankle Dorsiflexion  5 5  Hip Extension (Prone SLR) 3+ 3+  Hip Extension (Prone BLR) 3+  3+     TODAY'S TREATMENT:       TherEx: Heat donned to low back. Pt reports improvement in pain with use of heat, skin checked prior to and upon removal of heat, skin WNL and no adverse reaction to  treatment.  Supine: TrA activation with PPT and cuing for breathing technique 5x- notes "a little soreness" but no pain SAQ 12x each LE SLR 2x10 each LE. Rates medium-hard on R side, easier on LLE LTRs within pain-free range 2x12 each side March 20x alt LE - reports this one doesn't increase pain but feels "funny" Glute bridge 2x15 Hook[place]e adductor squeeze with pball 2x15 Green pball hamstring curls 15x, 20x  STS 10x. Rates easy-medium, no pain.  Initiated and reviewed HEP in session.    PATIENT EDUCATION: Education details: Pt educated throughout session about proper posture and technique with exercises. Improved exercise technique, movement at target joints, use of target muscles after min to mod verbal, visual, tactile cues Person educated: Patient Education method: Explanation, Tactile cues, and Verbal cues Education comprehension: verbalized understanding, returned demonstration, verbal cues required, and tactile cues required    HOME EXERCISE PROGRAM:  Access Code: [email or web address] URL: [email or web address] Date: 03/04/2022 Prepared by: Ricard Dillon  Exercises - Supine Hip Adduction Isometric with Ball  - 1 x daily - 5 x weekly - 3 sets - 15 reps - 3 seconds hold - Sit to Stand with Counter Support  - 1 x daily - 5 x weekly - 3 sets - 10 reps       Plan - 02/26/22 0855     Clinical Impression Statement Initiated HEP today to address strength deficits.  PT started pt off with interventions that do not currently exacerbate back pain but address greatest strength impairments. Pt generally fatigues quickly with all therex, and rates exercises as medium-hard. Pt did report vertigo with rolling, reported this is continuing problem over past month and that she has had and been treated for this before. As this is contributing to fall risk, pt would benefit from [facility] and Roll-testing if symptoms persist. The pt will continue to  benefit from skilled therapy to  address remaining deficits in order to improve overall QoL and return to PLOF.         Personal Factors and Comorbidities Comorbidity 3+;Age;Fitness;Past/Current Experience;Time since onset of injury/illness/exacerbation    Comorbidities memory impairment, anxiety, depression, DM, hx of kidney stones, obesity, RA    Examination-Activity Limitations Bed Mobility;Stand;Lift;Locomotion Level;Bend;Transfers;Carry;Continence;Sit;Sleep;Stairs;Squat    Examination-Participation Restrictions Church;Meal Prep;Cleaning;Community Activity;Driving;Interpersonal Relationship;Laundry;Yard Work    Conservation officer, historic buildings Evolving/Moderate complexity    Rehab Potential Poor    PT Frequency 2x / week    PT Duration 6 weeks    PT Treatment/Interventions Balance training;Neuromuscular re-education;Therapeutic activities;Patient/family education;Manual techniques;Therapeutic exercise;Electrical Stimulation;Cryotherapy;Moist Heat;Stair training;Functional mobility training;DME Instruction;Gait training;Passive range of motion;Dry needling    PT Next Visit Plan LE MMT, reassess hip ROM to compare to last year    PT Home Exercise Plan deferred until further examination can be completed    Consulted and Agree with Plan of Care Patient                 PT Short Term Goals - 02/25/22 0903       PT SHORT TERM GOAL #1   Title Pt will be independent with her initial HEP to decrease pain, improve strength, function, and ability to perform transfers more comfortably and with less difficulty.    Time 2    Period Weeks    Status New    Target Date 03/11/22      PT SHORT TERM GOAL #2   Title Pt will demonstrate improved tolerance to overground AMB by> 566ft without pain limitation:    Baseline eval: <316ft    Time 3    Period Weeks    Status New    Target Date 03/18/22      PT SHORT TERM GOAL #3   Title Pt to demonstrate improved tolerance to power actiivties AEB 5xSTS <15 secs    Baseline  Eval: 19sec    Time 3    Period Weeks    Status New    Target Date 03/18/22              PT Long Term Goals - 02/25/22 0904       PT LONG TERM GOAL #1   Title Pt to improve score on FOTO survery by >8 points to indicate improved self efficicacy in ADL/IADL mobility.    Baseline Eval: 40    Time 6    Period Weeks    Status New    Target Date 04/08/22      PT LONG TERM GOAL #2   Title Pt to demonstrate improved standing tolerance at home to >20 minutes.    Baseline eval: 5-10 minutes    Time 6    Period Weeks    Status New    Target Date 04/08/22      PT LONG TERM GOAL #3   Title Pt to demonstrate tolerance to without need to sit during testing, performance >104ft.    Baseline eval: tolerates less than 2 minutes of walking.    Time 6    Period Weeks    Status New    Target Date 04/08/22              Temple Pacini PT, DPT  Physical Therapist- [facility]  03/04/22, 1:06 PM

## 2022-03-06 ENCOUNTER — Telehealth: Payer: Self-pay

## 2022-03-06 ENCOUNTER — Ambulatory Visit: Payer: Medicare HMO | Attending: Neurological Surgery

## 2022-03-06 DIAGNOSIS — M545 Low back pain, unspecified: Secondary | ICD-10-CM | POA: Diagnosis not present

## 2022-03-06 DIAGNOSIS — G8929 Other chronic pain: Secondary | ICD-10-CM | POA: Insufficient documentation

## 2022-03-06 DIAGNOSIS — M25512 Pain in left shoulder: Secondary | ICD-10-CM | POA: Insufficient documentation

## 2022-03-06 DIAGNOSIS — M6281 Muscle weakness (generalized): Secondary | ICD-10-CM | POA: Diagnosis present

## 2022-03-06 DIAGNOSIS — R42 Dizziness and giddiness: Secondary | ICD-10-CM | POA: Insufficient documentation

## 2022-03-06 DIAGNOSIS — R262 Difficulty in walking, not elsewhere classified: Secondary | ICD-10-CM | POA: Diagnosis present

## 2022-03-06 DIAGNOSIS — R2681 Unsteadiness on feet: Secondary | ICD-10-CM | POA: Diagnosis present

## 2022-03-06 NOTE — Therapy (Signed)
OUTPATIENT PHYSICAL THERAPY TREATMENT NOTE   Patient Name: Jessica Cole MRN: [id number] DOB:November 05, 1950, 71 y.o., female Today's Date: 03/06/2022  PCP: Lynnea Ferrier, MD REFERRING PROVIDER: Tia Alert, MD   PT End of Session - 03/06/22 1131     Visit Number 4    Number of Visits 12    Date for PT Re-Evaluation 04/07/22    Authorization Type [employer] Medicare    Authorization Time Period 02/24/22-04/07/22    Progress Note Due on Visit 10    PT Start Time 1124   arrived late, misunderstanding of appointment time   PT Stop Time 1202    PT Time Calculation (min) 38 min    Activity Tolerance Patient tolerated treatment well;Patient limited by pain    Behavior During Therapy [facility] for tasks assessed/performed              Past Medical History:  Diagnosis Date   Anxiety    Asthma    seasonal with allergies   Colon polyps    Degenerative arthritis    Depression    Diabetes mellitus without complication (HCC)    Type II   Environmental allergies    Family history of adverse reaction to anesthesia    sister- PONV   Fatty liver    Glaucoma    Headache(784.0)    HX  MIGRAINES   History of bronchitis    History of kidney stones    History of pneumonia    Hypertension    Hypothyroidism    Obesity    OSA on CPAP    Plantar fasciitis    right   PONV (postoperative nausea and vomiting)    pt has never had any anesthesia complications, no post-op nausea or vomiting   Renal calculi    Renal calculi    Rheumatoid arthritis (HCC)    RLS (restless legs syndrome) 08/23/2014     Past Surgical History:  Procedure Laterality Date   ABDOMINAL HYSTERECTOMY     partial   APPENDECTOMY     Arthroscopic surgery, knee Left    BREAST BIOPSY Right    several   BREAST BIOPSY Right 03/11/2017   u/s bx neg   BUNIONECTOMY     LEFT    COLONOSCOPY W/ POLYPECTOMY     COLONOSCOPY WITH PROPOFOL N/A 08/14/2017   Procedure: COLONOSCOPY WITH PROPOFOL;  Surgeon: Christena Deem,  MD;  Location: [facility] ENDOSCOPY;  Service: Endoscopy;  Laterality: N/A;   EXTRACORPOREAL SHOCK WAVE LITHOTRIPSY Left 05/14/2017   Procedure: EXTRACORPOREAL SHOCK WAVE LITHOTRIPSY (ESWL);  Surgeon: Riki Altes, MD;  Location: [facility] ORS;  Service: Urology;  Laterality: Left;   EXTRACORPOREAL SHOCK WAVE LITHOTRIPSY Left 06/14/2020   Procedure: EXTRACORPOREAL SHOCK WAVE LITHOTRIPSY (ESWL);  Surgeon: Sondra Come, MD;  Location: [facility] ORS;  Service: Urology;  Laterality: Left;   FOOT SURGERY     RIGHT     KNEE ARTHROSCOPY W/ MENISCAL REPAIR Left    LASIK     LUMBAR LAMINECTOMY/DECOMPRESSION MICRODISCECTOMY Right 03/01/2021   Procedure: Microdiscectomy - Lumbar five-Sacral one - right;  Surgeon: Tia Alert, MD;  Location: [facility] OR;  Service: Neurosurgery;  Laterality: Right;   MAXIMUM ACCESS (MAS)POSTERIOR LUMBAR INTERBODY FUSION (PLIF) 1 LEVEL N/A 04/25/2016   Procedure: LUMBAR FOUR-FIVE  MAXIMUM ACCESS (MAS) POSTERIOR LUMBAR INTERBODY FUSION (PLIF) with extension of instrumentation LUMBAR TWO-FIVE;  Surgeon: Tia Alert, MD;  Location: [facility] OR;  Service: Neurosurgery;  Laterality: N/A;   MAXIMUM ACCESS (MAS)POSTERIOR LUMBAR  INTERBODY FUSION (PLIF) 2 LEVEL N/A 12/13/2015   Procedure: Lumbar two-three - Lumbar three-four MAXIMUM ACCESS (MAS) POSTERIOR LUMBAR INTERBODY FUSION (PLIF)  ;  Surgeon: Tia Alert, MD;  Location: [facility] NEURO ORS;  Service: Neurosurgery;  Laterality: N/A;   REVERSE SHOULDER ARTHROPLASTY Left 03/01/2020   Procedure: REVERSE SHOULDER ARTHROPLASTY;  Surgeon: Christena Flake, MD;  Location: [facility] ORS;  Service: Orthopedics;  Laterality: Left;   SHOULDER SURGERY     RIGHT    TONSILLECTOMY     Patient Active Problem List   Diagnosis Date Noted   S/P lumbar laminectomy 03/01/2021   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) 01/03/2019   Chronic cough 11/19/2016   Morbid obesity (HCC) 09/26/2016    Left ovarian cyst 05/30/2016   S/P lumbar spinal fusion 12/13/2015   Aortic calcification (HCC) 07/18/2015   Controlled type 2 diabetes mellitus without complication (HCC) 05/16/2015   Thrombocytopenia (HCC) 05/16/2015   Recurrent major depressive disorder, in full remission (HCC) 05/16/2015   Essential (primary) hypertension 05/16/2015   Fatty infiltration of liver 03/15/2015   Aortic valve stenosis, nonrheumatic 01/18/2015   Sciatica of right side 01/09/2015   Type 2 diabetes mellitus (HCC) 11/10/2014   RLS (restless legs syndrome) 08/23/2014   Neuritis or radiculitis due to rupture of lumbar intervertebral disc 06/13/2014   Degeneration of intervertebral disc of lumbar region 06/13/2014   Lumbar radiculitis 06/13/2014   Arthritis 01/23/2014   Arthritis of knee, degenerative 11/15/2013   Depression 09/29/2013   Insomnia 09/29/2013   Apnea, sleep 08/29/2013   History of nephrolithiasis 08/29/2013   Abnormal presence of protein in urine 08/29/2013   Headache, migraine 08/29/2013   BP (high blood pressure) 08/29/2013   HLD (hyperlipidemia) 08/29/2013   Allergic rhinitis 08/29/2013   Intractable migraine without aura 02/18/2013    REFERRING DIAG: Radiculopathy, Lumbosacral Region  THERAPY DIAG:   Chronic right-sided low back pain, unspecified whether sciatica present   Radiculopathy, lumbosacral region   Difficulty in walking, not elsewhere classified  Chronic bilateral low back pain, unspecified whether sciatica present  Muscle weakness (generalized)  Rationale for Evaluation and Treatment Rehabilitation  PERTINENT HISTORY:  Pt reports chronic low back pain with radiation into the right hip and Rt posterior leg. She is a limited historian due to memory recall deficits. Lumbar surgery has been recommended, however insurance is mandating a 6 week trial of PT. Lumbar surgical history includes: PLIF L4-5, L2-08 Apr 2016, L2/3 & L3/07 Dec 2015, Rt L5-S1  Microdiskectomy/laminectomy 03/01/21  PRECAUTIONS: Fall Risk   SUBJECTIVE:   SUBJECTIVE STATEMENT: Pt reports doing well today. Arrived late, believed her appointment was scheduled for 11:30. She lost her handout for HEP, would like a 2nd edition if possible.   PAIN:  Are you having pain? Yes: NPRS scale: 2-3/10 Pain location: R Lateral Thigh Pain description: Achey    TODAY'S TREATMENT:       TherEx: AA/ROM on Nustep, seat 4, arms 5 (with hot pack behind back due to short arms) 5 minutes for AA/ROM  Overground AMB laps in unit: 3x134ft, good control generally, no device,   STS 1x10, hands free,  Blue TB clam with foot spacer 1x20 Seated marching gray step taps 2lb AW 1x20 bilat, alternating sides   STS 1x10, hands free,  Blue TB clam with foot spacer 1x20 Seated marching gray step taps 2lb AW 1x20 bilat, alternating sides   Side stepping in // bars 3 RT  Seated recovery Side stepping in // bars 3 RT     PATIENT EDUCATION: Education details: Reviewed HEP again, pt lost handout, added 2 new exercises. No memory of 2 prior exercises from handout.  Person educated: Patient Education method: Explanation, Tactile cues, and Verbal cues Education comprehension: verbalized understanding, returned demonstration, verbal cues required, and tactile cues required    HOME EXERCISE PROGRAM: *below in "Home exercise section"    Plan - 02/26/22 0855     Clinical Impression Statement Reissue of HEP today as patient lost her handout. Continued focus on home exercises that do not currently exacerbate back pain but address strength deficits. Good tolerance to session in general, pt cued intermittently to keep count of reps for cognitive remediation. The pt will continue to benefit from skilled therapy to address remaining deficits and achieve goals of care.   Personal Factors and Comorbidities Comorbidity 3+;Age;Fitness;Past/Current Experience;Time since onset of  injury/illness/exacerbation    Comorbidities memory impairment, anxiety, depression, DM, hx of kidney stones, obesity, RA    Examination-Activity Limitations Bed Mobility;Stand;Lift;Locomotion Level;Bend;Transfers;Carry;Continence;Sit;Sleep;Stairs;Squat    Examination-Participation Restrictions Church;Meal Prep;Cleaning;Community Activity;Driving;Interpersonal Relationship;Laundry;Yard Work    Conservation officer, historic buildings Evolving/Moderate complexity    Rehab Potential Poor    PT Frequency 2x / week    PT Duration 6 weeks    PT Treatment/Interventions Balance training;Neuromuscular re-education;Therapeutic activities;Patient/family education;Manual techniques;Therapeutic exercise;Electrical Stimulation;Cryotherapy;Moist Heat;Stair training;Functional mobility training;DME Instruction;Gait training;Passive range of motion;Dry needling    PT Next Visit Plan    PT Home Exercise Plan Access Code: [email or web address] URL: [email or web address] Date: 03/06/2022 Prepared by: Alvera Novel  Exercises - Supine Hip Adduction Isometric with Ball  - 1 x daily - 5 x weekly - 3 sets - 15 reps - 3 seconds hold - Sit to Stand with Counter Support  - 1 x daily - 5 x weekly - 3 sets - 10 reps - Seated Long Arc Quad  - 1 x daily - 5 x weekly - 3 sets - 10 reps - 3 hold - Side Stepping with Counter Support  - 1 x daily - 5 x weekly - 3 sets - 20 reps   Consulted and Agree with Plan of Care Patient                 PT Short Term Goals - 02/25/22 0903       PT SHORT TERM GOAL #1   Title Pt will be independent with her initial HEP to decrease pain, improve strength, function, and ability to perform transfers more comfortably and with less difficulty.    Time 2    Period Weeks    Status New    Target Date 03/11/22      PT SHORT TERM GOAL #2   Title Pt will demonstrate improved tolerance to overground AMB by> 580ft without pain limitation:    Baseline eval: <325ft    Time 3    Period  Weeks    Status New    Target Date 03/18/22      PT SHORT TERM GOAL #3   Title Pt to demonstrate improved tolerance to power actiivties AEB 5xSTS <15 secs    Baseline Eval: 19sec    Time 3    Period Weeks    Status New    Target Date 03/18/22              PT Long Term Goals - 02/25/22 0904       PT LONG TERM GOAL #1   Title Pt to improve score  on FOTO survery by >8 points to indicate improved self efficicacy in ADL/IADL mobility.    Baseline Eval: 40    Time 6    Period Weeks    Status New    Target Date 04/08/22      PT LONG TERM GOAL #2   Title Pt to demonstrate improved standing tolerance at home to >20 minutes.    Baseline eval: 5-10 minutes    Time 6    Period Weeks    Status New    Target Date 04/08/22      PT LONG TERM GOAL #3   Title Pt to demonstrate tolerance to 6MWT without need to sit during testing, performance >1092ft.    Baseline eval: tolerates less than 2 minutes of walking.    Time 6    Period Weeks    Status New    Target Date 04/08/22              11:39 AM, 03/06/22 Etta Grandchild, PT, DPT Physical Therapist - [facility]  Outpatient Physical Therapy- [place] [phone]     Physical Therapist- [facility]  03/06/22, 11:39 AM

## 2022-03-06 NOTE — Telephone Encounter (Addendum)
Pt did not show for appointment as scheduled. Author called pt's mobile number at 11:20am 03/06/22, left a HIPAA compliant VM.   11:23 AM, 03/06/22 Etta Grandchild, PT, DPT Physical Therapist - [place] [phone]    ---------------------------  Addendum: pt arrvied to clinic just after this call med, thought her appointment was scheduled for 11:30. Author had an available opening and saw pt for treatment.   1:10 PM, 03/06/22 Etta Grandchild, PT, DPT Physical Therapist - [place] [phone]

## 2022-03-11 ENCOUNTER — Ambulatory Visit: Payer: Medicare HMO

## 2022-03-11 DIAGNOSIS — G8929 Other chronic pain: Secondary | ICD-10-CM

## 2022-03-11 DIAGNOSIS — M6281 Muscle weakness (generalized): Secondary | ICD-10-CM

## 2022-03-11 DIAGNOSIS — R262 Difficulty in walking, not elsewhere classified: Secondary | ICD-10-CM

## 2022-03-11 DIAGNOSIS — M545 Low back pain, unspecified: Secondary | ICD-10-CM | POA: Diagnosis not present

## 2022-03-11 NOTE — Therapy (Signed)
OUTPATIENT PHYSICAL THERAPY TREATMENT NOTE   Patient Name: Jessica Cole MRN: [id number] DOB:March 05, 1951, 71 y.o., female Today's Date: 03/11/2022  PCP: Adin Hector, MD REFERRING PROVIDER: Eustace Moore, MD   PT End of Session - 03/11/22 0936     Visit Number 5    Number of Visits 12    Date for PT Re-Evaluation 04/07/22    Authorization Type [employer] Medicare    Authorization Time Period 02/24/22-04/07/22    Progress Note Due on Visit 10    PT Start Time 0932    PT Stop Time 1012    PT Time Calculation (min) 40 min    Activity Tolerance Patient tolerated treatment well;Patient limited by pain    Behavior During Therapy [facility] for tasks assessed/performed              Past Medical History:  Diagnosis Date   Anxiety    Asthma    seasonal with allergies   Colon polyps    Degenerative arthritis    Depression    Diabetes mellitus without complication ([place])    Type II   Environmental allergies    Family history of adverse reaction to anesthesia    sister- PONV   Fatty liver    Glaucoma    Headache(784.0)    HX  MIGRAINES   History of bronchitis    History of kidney stones    History of pneumonia    Hypertension    Hypothyroidism    Obesity    OSA on CPAP    Plantar fasciitis    right   PONV (postoperative nausea and vomiting)    pt has never had any anesthesia complications, no post-op nausea or vomiting   Renal calculi    Renal calculi    Rheumatoid arthritis (HCC)    RLS (restless legs syndrome) 08/23/2014     Past Surgical History:  Procedure Laterality Date   ABDOMINAL HYSTERECTOMY     partial   APPENDECTOMY     Arthroscopic surgery, knee Left    BREAST BIOPSY Right    several   BREAST BIOPSY Right 03/11/2017   u/s bx neg   BUNIONECTOMY     LEFT    COLONOSCOPY W/ POLYPECTOMY     COLONOSCOPY WITH PROPOFOL N/A 08/14/2017   Procedure: COLONOSCOPY WITH PROPOFOL;  Surgeon: Lollie Sails, MD;  Location: [facility] ENDOSCOPY;  Service: Endoscopy;   Laterality: N/A;   EXTRACORPOREAL SHOCK WAVE LITHOTRIPSY Left 05/14/2017   Procedure: EXTRACORPOREAL SHOCK WAVE LITHOTRIPSY (ESWL);  Surgeon: Abbie Sons, MD;  Location: [facility] ORS;  Service: Urology;  Laterality: Left;   EXTRACORPOREAL SHOCK WAVE LITHOTRIPSY Left 06/14/2020   Procedure: EXTRACORPOREAL SHOCK WAVE LITHOTRIPSY (ESWL);  Surgeon: Billey Co, MD;  Location: [facility] ORS;  Service: Urology;  Laterality: Left;   FOOT SURGERY     RIGHT     KNEE ARTHROSCOPY W/ MENISCAL REPAIR Left    LASIK     LUMBAR LAMINECTOMY/DECOMPRESSION MICRODISCECTOMY Right 03/01/2021   Procedure: Microdiscectomy - Lumbar five-Sacral one - right;  Surgeon: Eustace Moore, MD;  Location: [place];  Service: Neurosurgery;  Laterality: Right;   MAXIMUM ACCESS (MAS)POSTERIOR LUMBAR INTERBODY FUSION (PLIF) 1 LEVEL N/A 04/25/2016   Procedure: LUMBAR FOUR-FIVE  MAXIMUM ACCESS (MAS) POSTERIOR LUMBAR INTERBODY FUSION (PLIF) with extension of instrumentation LUMBAR TWO-FIVE;  Surgeon: Eustace Moore, MD;  Location: [place];  Service: Neurosurgery;  Laterality: N/A;   MAXIMUM ACCESS (MAS)POSTERIOR LUMBAR INTERBODY FUSION (PLIF) 2 LEVEL N/A 12/13/2015  Procedure: Lumbar two-three - Lumbar three-four MAXIMUM ACCESS (MAS) POSTERIOR LUMBAR INTERBODY FUSION (PLIF)  ;  Surgeon: Tia Alert, MD;  Location: [facility] NEURO ORS;  Service: Neurosurgery;  Laterality: N/A;   REVERSE SHOULDER ARTHROPLASTY Left 03/01/2020   Procedure: REVERSE SHOULDER ARTHROPLASTY;  Surgeon: Christena Flake, MD;  Location: [facility] ORS;  Service: Orthopedics;  Laterality: Left;   SHOULDER SURGERY     RIGHT    TONSILLECTOMY     Patient Active Problem List   Diagnosis Date Noted   S/P lumbar laminectomy 03/01/2021   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) 01/03/2019   Chronic cough 11/19/2016   Morbid obesity (HCC) 09/26/2016   Left ovarian cyst 05/30/2016   S/P lumbar spinal  fusion 12/13/2015   Aortic calcification (HCC) 07/18/2015   Controlled type 2 diabetes mellitus without complication (HCC) 05/16/2015   Thrombocytopenia (HCC) 05/16/2015   Recurrent major depressive disorder, in full remission (HCC) 05/16/2015   Essential (primary) hypertension 05/16/2015   Fatty infiltration of liver 03/15/2015   Aortic valve stenosis, nonrheumatic 01/18/2015   Sciatica of right side 01/09/2015   Type 2 diabetes mellitus (HCC) 11/10/2014   RLS (restless legs syndrome) 08/23/2014   Neuritis or radiculitis due to rupture of lumbar intervertebral disc 06/13/2014   Degeneration of intervertebral disc of lumbar region 06/13/2014   Lumbar radiculitis 06/13/2014   Arthritis 01/23/2014   Arthritis of knee, degenerative 11/15/2013   Depression 09/29/2013   Insomnia 09/29/2013   Apnea, sleep 08/29/2013   History of nephrolithiasis 08/29/2013   Abnormal presence of protein in urine 08/29/2013   Headache, migraine 08/29/2013   BP (high blood pressure) 08/29/2013   HLD (hyperlipidemia) 08/29/2013   Allergic rhinitis 08/29/2013   Intractable migraine without aura 02/18/2013    REFERRING DIAG: Radiculopathy, Lumbosacral Region  THERAPY DIAG:   Chronic right-sided low back pain, unspecified whether sciatica present   Radiculopathy, lumbosacral region   Difficulty in walking, not elsewhere classified  Chronic bilateral low back pain, unspecified whether sciatica present  Muscle weakness (generalized)  Difficulty in walking, not elsewhere classified  Rationale for Evaluation and Treatment Rehabilitation  PERTINENT HISTORY:  Pt reports chronic low back pain with radiation into the right hip and Rt posterior leg. She is a limited historian due to memory recall deficits. Lumbar surgery has been recommended, however insurance is mandating a 6 week trial of PT. Lumbar surgical history includes: PLIF L4-5, L2-08 Apr 2016, L2/3 & L3/07 Dec 2015, Rt L5-S1  Microdiskectomy/laminectomy 03/01/21  PRECAUTIONS: Fall Risk   SUBJECTIVE:   SUBJECTIVE STATEMENT: Pt had a migraine HA over the weekend, now improved. Her memory deficits preclude reporting on HEP performance. She asks about getting on the [place] again which to [place] Parkin seems to feel might be unsafe without further observation.   PAIN:  Are you having pain? Yes: NPRS scale: 4/10 Pain location: low back Pain description: none     TODAY'S TREATMENT:       TherEx: AA/ROM on Nustep, seat 4, arms 5 (with hot pack behind back due to short arms) 6 minutes for AA/ROM STS 1x10, hands free  STS 1x10, hands free  Overground AMB 3 laps, no device Overground AMB 5 laps c 4WW (improved tolerance) 747ft  Half foam roller stepovers 1x20 c UE support Half foam roller stepovers 1x20 c UE support  6" lateral step ups right 1x10 with single UE support (ipsilateral), then 3" step ups Lefts (weaker leg)  6" lateral step ups right 1x10 with single UE support (ipsilateral), then 3" step ups Lefts (weaker leg)    PATIENT EDUCATION: Education details: Reviewed HEP again, pt lost handout, added 2 new exercises. No memory of 2 prior exercises from handout.  Person educated: Patient Education method: Explanation, Tactile cues, and Verbal cues Education comprehension: verbalized understanding, returned demonstration, verbal cues required, and tactile cues required    HOME EXERCISE PROGRAM: *below in "Home exercise section"    Plan - 02/26/22 0855     Clinical Impression Statement Pt still struggling with HEP compliance due to memory deficits. She responds well in session. Good tolerance to session in general, pt cued intermittently to keep count of reps for cognitive remediation. AMB improving in tolerance and speed, educated pt on use of 4WW for longer distance walking and fitness walking. The pt will continue to benefit from skilled therapy to address remaining deficits and achieve goals of care.    Personal Factors and Comorbidities Comorbidity 3+;Age;Fitness;Past/Current Experience;Time since onset of injury/illness/exacerbation    Comorbidities memory impairment, anxiety, depression, DM, hx of kidney stones, obesity, RA    Examination-Activity Limitations Bed Mobility;Stand;Lift;Locomotion Level;Bend;Transfers;Carry;Continence;Sit;Sleep;Stairs;Squat    Examination-Participation Restrictions Church;Meal Prep;Cleaning;Community Activity;Driving;Interpersonal Relationship;Laundry;Yard Work    Conservation officer, historic buildings Evolving/Moderate complexity    Rehab Potential Poor    PT Frequency 2x / week    PT Duration 6 weeks    PT Treatment/Interventions Balance training;Neuromuscular re-education;Therapeutic activities;Patient/family education;Manual techniques;Therapeutic exercise;Electrical Stimulation;Cryotherapy;Moist Heat;Stair training;Functional mobility training;DME Instruction;Gait training;Passive range of motion;Dry needling    PT Next Visit Plan Continue with work toward goal achievement    PT Home Exercise Plan Access Code: [email or web address] URL: [email or web address] Date: 03/06/2022 Prepared by: Alvera Novel  Exercises - Supine Hip Adduction Isometric with Ball  - 1 x daily - 5 x weekly - 3 sets - 15 reps - 3 seconds hold - Sit to Stand with Counter Support  - 1 x daily - 5 x weekly - 3 sets - 10 reps - Seated Long Arc Quad  - 1 x daily - 5 x weekly - 3 sets - 10 reps - 3 hold - Side Stepping with Counter Support  - 1 x daily - 5 x weekly - 3 sets - 20 reps   Consulted and Agree with Plan of Care Patient                 PT Short Term Goals - 02/25/22 0903       PT SHORT TERM GOAL #1   Title Pt will be independent with her initial HEP to decrease pain, improve strength, function, and ability to perform transfers more comfortably and with less difficulty.  03/11/22: limited by ST memory deficits;    Time 2    Period Weeks    Status Ongoing     Target Date 03/11/22      PT SHORT TERM GOAL #2   Title Pt will demonstrate improved tolerance to overground AMB by> 527ft without pain limitation:    Baseline eval: <368ft; 03/11/22: 423ft without pain exacerbation, no AD, then 711ft c 4WW without pain exacerbation.    Time 3    Period Weeks    Status On-going    Target Date 03/18/22      PT SHORT TERM GOAL #3   Title Pt to demonstrate improved tolerance to power actiivties AEB 5xSTS <15 secs    Baseline Eval: 19sec; 03/11/22: 11.45sec    Time 3    Period Weeks  Status Coming along     Target Date 03/18/22              PT Long Term Goals - 02/25/22 0904       PT LONG TERM GOAL #1   Title Pt to improve score on FOTO survery by >8 points to indicate improved self efficicacy in ADL/IADL mobility.    Baseline Eval: 40    Time 6    Period Weeks    Status New    Target Date 04/08/22      PT LONG TERM GOAL #2   Title Pt to demonstrate improved standing tolerance at home to >20 minutes.    Baseline eval: 5-10 minutes    Time 6    Period Weeks    Status New    Target Date 04/08/22      PT LONG TERM GOAL #3   Title Pt to demonstrate tolerance to without need to sit during testing, performance >1036ft.    Baseline eval: tolerates less than 2 minutes of walking.  03/11/22: c 4WW AMB 51m30s without pain exacerbation (763ft)    Time 6    Period Weeks    Status New    Target Date 04/08/22              9:38 AM, 03/11/22 Rosamaria Lints, PT, DPT Physical Therapist - [place] [facility]  Outpatient Physical Therapy- Main Campus [phone]     Physical Therapist- [facility]  03/11/22, 9:38 AM

## 2022-03-13 ENCOUNTER — Ambulatory Visit: Payer: Medicare HMO

## 2022-03-13 ENCOUNTER — Telehealth: Payer: Self-pay

## 2022-03-13 NOTE — Telephone Encounter (Signed)
Pt did not show for scheduled 8:00 appointment. Author called pt 03/13/22 [id number]. Left a HIPAA-friendly VM. Left details on next scheduled appointment.   8:29 AM, 03/13/22 Rosamaria Lints, PT, DPT Physical Therapist - [facility]  Outpatient Physical Therapy- Main Campus [phone]

## 2022-03-18 ENCOUNTER — Ambulatory Visit: Payer: Medicare HMO

## 2022-03-18 DIAGNOSIS — G8929 Other chronic pain: Secondary | ICD-10-CM

## 2022-03-18 DIAGNOSIS — M6281 Muscle weakness (generalized): Secondary | ICD-10-CM

## 2022-03-18 DIAGNOSIS — M545 Low back pain, unspecified: Secondary | ICD-10-CM | POA: Diagnosis not present

## 2022-03-18 NOTE — Therapy (Signed)
OUTPATIENT PHYSICAL THERAPY TREATMENT NOTE   Patient Name: Jessica Cole MRN: [id number] DOB:December 17, 1950, 71 y.o., female Today's Date: 03/18/2022  PCP: Lynnea FerrierKlein, Bert J III, MD REFERRING PROVIDER: Tia AlertJones, David S, MD   PT End of Session - 03/18/22 0810     Visit Number 6    Number of Visits 12    Date for PT Re-Evaluation 04/07/22    Authorization Type [employer]na Medicare    Authorization Time Period 02/24/22-04/07/22    Progress Note Due on Visit 10    PT Start Time 0803    PT Stop Time 0844    PT Time Calculation (min) 41 min    Equipment Utilized During Treatment Gait belt    Activity Tolerance Patient tolerated treatment well;Patient limited by pain    Behavior During Therapy WFL for tasks assessed/performed              Past Medical History:  Diagnosis Date   Anxiety    Asthma    seasonal with allergies   Colon polyps    Degenerative arthritis    Depression    Diabetes mellitus without complication (HCC)    Type II   Environmental allergies    Family history of adverse reaction to anesthesia    sister- PONV   Fatty liver    Glaucoma    Headache(784.0)    HX  MIGRAINES   History of bronchitis    History of kidney stones    History of pneumonia    Hypertension    Hypothyroidism    Obesity    OSA on CPAP    Plantar fasciitis    right   PONV (postoperative nausea and vomiting)    pt has never had any anesthesia complications, no post-op nausea or vomiting   Renal calculi    Renal calculi    Rheumatoid arthritis (HCC)    RLS (restless legs syndrome) 08/23/2014     Past Surgical History:  Procedure Laterality Date   ABDOMINAL HYSTERECTOMY     partial   APPENDECTOMY     Arthroscopic surgery, knee Left    BREAST BIOPSY Right    several   BREAST BIOPSY Right 03/11/2017   u/s bx neg   BUNIONECTOMY     LEFT    COLONOSCOPY W/ POLYPECTOMY     COLONOSCOPY WITH PROPOFOL N/A 08/14/2017   Procedure: COLONOSCOPY WITH PROPOFOL;  Surgeon: Christena DeemSkulskie, Martin U,  MD;  [facility];  Service: Endoscopy;  Laterality: N/A;   EXTRACORPOREAL SHOCK WAVE LITHOTRIPSY Left 05/14/2017   Procedure: EXTRACORPOREAL SHOCK WAVE LITHOTRIPSY (ESWL);  Surgeon: Riki AltesStoioff, Scott C, MD;  Location: [facility] ORS;  Service: Urology;  Laterality: Left;   EXTRACORPOREAL SHOCK WAVE LITHOTRIPSY Left 06/14/2020   Procedure: EXTRACORPOREAL SHOCK WAVE LITHOTRIPSY (ESWL);  Surgeon: Sondra ComeSninsky, Brian C, MD;  Location: [facility] ORS;  Service: Urology;  Laterality: Left;   FOOT SURGERY     RIGHT     KNEE ARTHROSCOPY W/ MENISCAL REPAIR Left    LASIK     LUMBAR LAMINECTOMY/DECOMPRESSION MICRODISCECTOMY Right 03/01/2021   Procedure: Microdiscectomy - Lumbar five-Sacral one - right;  Surgeon: Tia AlertJones, David S, MD;  [facility];  Service: Neurosurgery;  Laterality: Right;   MAXIMUM ACCESS (MAS)POSTERIOR LUMBAR INTERBODY FUSION (PLIF) 1 LEVEL N/A 04/25/2016   Procedure: LUMBAR FOUR-FIVE  MAXIMUM ACCESS (MAS) POSTERIOR LUMBAR INTERBODY FUSION (PLIF) with extension of instrumentation LUMBAR TWO-FIVE;  Surgeon: Tia Alertavid S Jones, MD;  [facility];  Service: Neurosurgery;  Laterality: N/A;   MAXIMUM ACCESS (  MAS)POSTERIOR LUMBAR INTERBODY FUSION (PLIF) 2 LEVEL N/A 12/13/2015   Procedure: Lumbar two-three - Lumbar three-four MAXIMUM ACCESS (MAS) POSTERIOR LUMBAR INTERBODY FUSION (PLIF)  ;  Surgeon: Tia Alert, MD;  Location: [facility] NEURO ORS;  Service: Neurosurgery;  Laterality: N/A;   REVERSE SHOULDER ARTHROPLASTY Left 03/01/2020   Procedure: REVERSE SHOULDER ARTHROPLASTY;  Surgeon: Christena Flake, MD;  Location: [facility] ORS;  Service: Orthopedics;  Laterality: Left;   SHOULDER SURGERY     RIGHT    TONSILLECTOMY     Patient Active Problem List   Diagnosis Date Noted   S/P lumbar laminectomy 03/01/2021   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) 01/03/2019   Chronic cough 11/19/2016   Morbid obesity (HCC) 09/26/2016    Left ovarian cyst 05/30/2016   S/P lumbar spinal fusion 12/13/2015   Aortic calcification (HCC) 07/18/2015   Controlled type 2 diabetes mellitus without complication (HCC) 05/16/2015   Thrombocytopenia (HCC) 05/16/2015   Recurrent major depressive disorder, in full remission (HCC) 05/16/2015   Essential (primary) hypertension 05/16/2015   Fatty infiltration of liver 03/15/2015   Aortic valve stenosis, nonrheumatic 01/18/2015   Sciatica of right side 01/09/2015   Type 2 diabetes mellitus (HCC) 11/10/2014   RLS (restless legs syndrome) 08/23/2014   Neuritis or radiculitis due to rupture of lumbar intervertebral disc 06/13/2014   Degeneration of intervertebral disc of lumbar region 06/13/2014   Lumbar radiculitis 06/13/2014   Arthritis 01/23/2014   Arthritis of knee, degenerative 11/15/2013   Depression 09/29/2013   Insomnia 09/29/2013   Apnea, sleep 08/29/2013   History of nephrolithiasis 08/29/2013   Abnormal presence of protein in urine 08/29/2013   Headache, migraine 08/29/2013   BP (high blood pressure) 08/29/2013   HLD (hyperlipidemia) 08/29/2013   Allergic rhinitis 08/29/2013   Intractable migraine without aura 02/18/2013    REFERRING DIAG: Radiculopathy, Lumbosacral Region  THERAPY DIAG:   Chronic right-sided low back pain, unspecified whether sciatica present   Radiculopathy, lumbosacral region   Difficulty in walking, not elsewhere classified  Chronic right-sided low back pain, unspecified whether sciatica present  Muscle weakness (generalized)  Rationale for Evaluation and Treatment Rehabilitation  PERTINENT HISTORY:  Pt reports chronic low back pain with radiation into the right hip and Rt posterior leg. She is a limited historian due to memory recall deficits. Lumbar surgery has been recommended, however insurance is mandating a 6 week trial of PT. Lumbar surgical history includes: PLIF L4-5, L2-08 Apr 2016, L2/3 & L3/07 Dec 2015, Rt L5-S1  Microdiskectomy/laminectomy 03/01/21  PRECAUTIONS: Fall Risk   SUBJECTIVE:   SUBJECTIVE STATEMENT: Pt reports she currently has a cold and has been coughing a lot. She has scheduled an appt with her physician to address this. Pt reports her pain level is currently a 2/10 felt more on R side of her low back and running down her RLE. Reports HEP is not going well, she reports she keeps forgetting to do them.  PAIN:  Are you having pain? Yes: NPRS scale: 2/10 Pain location: R side low back Pain description: none     TODAY'S TREATMENT:       TherEx: Nustep interval training, seat 4, lvls 1, 3, and 5 performed in 1 and 2 min intervals for a total of  Cuing for SPM >60 throughout. PT adjusts intensity as appropriate to maintain therapeutic range and monitors pt for response throughout. Pt performs a total of 6 minutes. Very fatigued with lvl 5.  STS 1x12, hands free. Rates medium STS 1x10, hands free. Fatiguing STS 1x8, hands free with pt holding 1000 gr ball. Discontinued due to increase in LBP  Overground AMB 3 laps (1 lap=148 ft) no device with 2.5# AW donned each LE. Pt fatigues, rates medium.  2.5# AW each LE: Seated march 2x20 alt LE. Rates easy  LAQ 2x20 alt LE. Rates hard   Seated BTB hip abd/ER 2x20 BLE  Physioball FWD/BCKWD rollouts 30x  NMR:  FWD/BCKWD and LTL step-overs (orange hurdle as obstacle) with and without UE support. Pt completes multiple reps of each, multiple errors, uses step-strategy to correct LOB.   PATIENT EDUCATION: Education details: Pt educated throughout session about proper posture and technique with exercises. Improved exercise technique, movement at target joints, use of target muscles after min to mod verbal, visual, tactile cues.  Person educated: Patient Education method: Explanation, Tactile cues, and Verbal cues Education comprehension: verbalized understanding, returned demonstration, verbal cues required, and tactile cues  required    HOME EXERCISE PROGRAM: *below in "Home exercise section"    Plan - 02/26/22 0855     Clinical Impression Statement Discussed with pt ways to incorporate HEP into daily activities as pt still reports noncompliance with HEP. Pt still somewhat pain-limited throughout session, but otherwise tolerates interventions well. She does generally rate exercises as medium intensity and fatiguing. The pt will continue to benefit from skilled therapy to address remaining deficits and achieve goals of care.   Personal Factors and Comorbidities Comorbidity 3+;Age;Fitness;Past/Current Experience;Time since onset of injury/illness/exacerbation    Comorbidities memory impairment, anxiety, depression, DM, hx of kidney stones, obesity, RA    Examination-Activity Limitations Bed Mobility;Stand;Lift;Locomotion Level;Bend;Transfers;Carry;Continence;Sit;Sleep;Stairs;Squat    Examination-Participation Restrictions Church;Meal Prep;Cleaning;Community Activity;Driving;Interpersonal Relationship;Laundry;Yard Work    Conservation officer, historic buildings Evolving/Moderate complexity    Rehab Potential Poor    PT Frequency 2x / week    PT Duration 6 weeks    PT Treatment/Interventions Balance training;Neuromuscular re-education;Therapeutic activities;Patient/family education;Manual techniques;Therapeutic exercise;Electrical Stimulation;Cryotherapy;Moist Heat;Stair training;Functional mobility training;DME Instruction;Gait training;Passive range of motion;Dry needling    PT Next Visit Plan Continue with work toward goal achievement    PT Home Exercise Plan Access Code: [email or web address] URL: [email or web address] Date: 03/06/2022 Prepared by: Alvera Novel  Exercises - Supine Hip Adduction Isometric with Ball  - 1 x daily - 5 x weekly - 3 sets - 15 reps - 3 seconds hold - Sit to Stand with Counter Support  - 1 x daily - 5 x weekly - 3 sets - 10 reps - Seated Long Arc Quad  - 1 x daily - 5 x weekly - 3 sets  - 10 reps - 3 hold - Side Stepping with Counter Support  - 1 x daily - 5 x weekly - 3 sets - 20 reps   Consulted and Agree with Plan of Care Patient                 PT Short Term Goals - 02/25/22 0903       PT SHORT TERM GOAL #1   Title Pt will be independent with her initial HEP to decrease pain, improve strength, function, and ability to perform transfers more comfortably and with less difficulty.  03/11/22: limited by ST memory deficits;    Time 2    Period Weeks    Status Ongoing    Target Date 03/11/22      PT SHORT TERM GOAL #2   Title Pt will demonstrate improved tolerance to overground AMB by> 575ft without pain  limitation:    Baseline eval: <36ft; 03/11/22: 453ft without pain exacerbation, no AD, then 780ft c 4WW without pain exacerbation.    Time 3    Period Weeks    Status On-going    Target Date 03/18/22      PT SHORT TERM GOAL #3   Title Pt to demonstrate improved tolerance to power actiivties AEB 5xSTS <15 secs    Baseline Eval: 19sec; 03/11/22: 11.45sec    Time 3    Period Weeks    Status Coming along     Target Date 03/18/22              PT Long Term Goals - 02/25/22 0904       PT LONG TERM GOAL #1   Title Pt to improve score on FOTO survery by >8 points to indicate improved self efficicacy in ADL/IADL mobility.    Baseline Eval: 40    Time 6    Period Weeks    Status New    Target Date 04/08/22      PT LONG TERM GOAL #2   Title Pt to demonstrate improved standing tolerance at home to >20 minutes.    Baseline eval: 5-10 minutes    Time 6    Period Weeks    Status New    Target Date 04/08/22      PT LONG TERM GOAL #3   Title Pt to demonstrate tolerance to without need to sit during testing, performance >1033ft.    Baseline eval: tolerates less than 2 minutes of walking.  03/11/22: c 4WW AMB 62m30s without pain exacerbation (725ft)    Time 6    Period Weeks    Status New    Target Date 04/08/22              9:03 AM,  03/18/22 Temple Pacini PT, DPT  Physical Therapist - [facility]  Outpatient Physical Therapy- Main Campus [phone]     Physical Therapist- [facility]  03/18/22, 9:03 AM

## 2022-03-20 ENCOUNTER — Ambulatory Visit: Payer: Medicare HMO

## 2022-03-25 ENCOUNTER — Ambulatory Visit: Payer: Medicare HMO

## 2022-03-25 DIAGNOSIS — G8929 Other chronic pain: Secondary | ICD-10-CM

## 2022-03-25 DIAGNOSIS — M545 Low back pain, unspecified: Secondary | ICD-10-CM | POA: Diagnosis not present

## 2022-03-25 DIAGNOSIS — M6281 Muscle weakness (generalized): Secondary | ICD-10-CM

## 2022-03-25 NOTE — Therapy (Incomplete)
OUTPATIENT PHYSICAL THERAPY TREATMENT NOTE   Patient Name: Jessica Cole MRN: [id number] DOB:05-26-1950, 71 y.o., female Today's Date: 03/25/2022  PCP: Lynnea Ferrier, MD REFERRING PROVIDER: Tia Alert, MD      Past Medical History:  Diagnosis Date   Anxiety    Asthma    seasonal with allergies   Colon polyps    Degenerative arthritis    Depression    Diabetes mellitus without complication (HCC)    Type II   Environmental allergies    Family history of adverse reaction to anesthesia    sister- PONV   Fatty liver    Glaucoma    Headache(784.0)    HX  MIGRAINES   History of bronchitis    History of kidney stones    History of pneumonia    Hypertension    Hypothyroidism    Obesity    OSA on CPAP    Plantar fasciitis    right   PONV (postoperative nausea and vomiting)    pt has never had any anesthesia complications, no post-op nausea or vomiting   Renal calculi    Renal calculi    Rheumatoid arthritis (HCC)    RLS (restless legs syndrome) 08/23/2014     Past Surgical History:  Procedure Laterality Date   ABDOMINAL HYSTERECTOMY     partial   APPENDECTOMY     Arthroscopic surgery, knee Left    BREAST BIOPSY Right    several   BREAST BIOPSY Right 03/11/2017   u/s bx neg   BUNIONECTOMY     LEFT    COLONOSCOPY W/ POLYPECTOMY     COLONOSCOPY WITH PROPOFOL N/A 08/14/2017   Procedure: COLONOSCOPY WITH PROPOFOL;  Surgeon: Christena Deem, MD;  Location: [facility] ENDOSCOPY;  Service: Endoscopy;  Laterality: N/A;   EXTRACORPOREAL SHOCK WAVE LITHOTRIPSY Left 05/14/2017   Procedure: EXTRACORPOREAL SHOCK WAVE LITHOTRIPSY (ESWL);  Surgeon: Riki Altes, MD;  Location: [facility] ORS;  Service: Urology;  Laterality: Left;   EXTRACORPOREAL SHOCK WAVE LITHOTRIPSY Left 06/14/2020   Procedure: EXTRACORPOREAL SHOCK WAVE LITHOTRIPSY (ESWL);  Surgeon: Sondra Come, MD;  Location: [facility] ORS;  Service: Urology;  Laterality: Left;   FOOT SURGERY     RIGHT     KNEE  ARTHROSCOPY W/ MENISCAL REPAIR Left    LASIK     LUMBAR LAMINECTOMY/DECOMPRESSION MICRODISCECTOMY Right 03/01/2021   Procedure: Microdiscectomy - Lumbar five-Sacral one - right;  Surgeon: Tia Alert, MD;  Location: [facility] OR;  Service: Neurosurgery;  Laterality: Right;   MAXIMUM ACCESS (MAS)POSTERIOR LUMBAR INTERBODY FUSION (PLIF) 1 LEVEL N/A 04/25/2016   Procedure: LUMBAR FOUR-FIVE  MAXIMUM ACCESS (MAS) POSTERIOR LUMBAR INTERBODY FUSION (PLIF) with extension of instrumentation LUMBAR TWO-FIVE;  Surgeon: Tia Alert, MD;  Location: [facility] OR;  Service: Neurosurgery;  Laterality: N/A;   MAXIMUM ACCESS (MAS)POSTERIOR LUMBAR INTERBODY FUSION (PLIF) 2 LEVEL N/A 12/13/2015   Procedure: Lumbar two-three - Lumbar three-four MAXIMUM ACCESS (MAS) POSTERIOR LUMBAR INTERBODY FUSION (PLIF)  ;  Surgeon: Tia Alert, MD;  Location: [facility] NEURO ORS;  Service: Neurosurgery;  Laterality: N/A;   REVERSE SHOULDER ARTHROPLASTY Left 03/01/2020   Procedure: REVERSE SHOULDER ARTHROPLASTY;  Surgeon: Christena Flake, MD;  Location: [facility] ORS;  Service: Orthopedics;  Laterality: Left;   SHOULDER SURGERY     RIGHT    TONSILLECTOMY     Patient Active Problem List   Diagnosis Date Noted   S/P lumbar laminectomy 03/01/2021   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  edema associated with type 2 diabetes mellitus (HCC) 01/03/2019   Chronic cough 11/19/2016   Morbid obesity (HCC) 09/26/2016   Left ovarian cyst 05/30/2016   S/P lumbar spinal fusion 12/13/2015   Aortic calcification (HCC) 07/18/2015   Controlled type 2 diabetes mellitus without complication (HCC) 05/16/2015   Thrombocytopenia (HCC) 05/16/2015   Recurrent major depressive disorder, in full remission (HCC) 05/16/2015   Essential (primary) hypertension 05/16/2015   Fatty infiltration of liver 03/15/2015   Aortic valve stenosis, nonrheumatic 01/18/2015   Sciatica of right side 01/09/2015   Type 2 diabetes mellitus (HCC) 11/10/2014    RLS (restless legs syndrome) 08/23/2014   Neuritis or radiculitis due to rupture of lumbar intervertebral disc 06/13/2014   Degeneration of intervertebral disc of lumbar region 06/13/2014   Lumbar radiculitis 06/13/2014   Arthritis 01/23/2014   Arthritis of knee, degenerative 11/15/2013   Depression 09/29/2013   Insomnia 09/29/2013   Apnea, sleep 08/29/2013   History of nephrolithiasis 08/29/2013   Abnormal presence of protein in urine 08/29/2013   Headache, migraine 08/29/2013   BP (high blood pressure) 08/29/2013   HLD (hyperlipidemia) 08/29/2013   Allergic rhinitis 08/29/2013   Intractable migraine without aura 02/18/2013    REFERRING DIAG: Radiculopathy, Lumbosacral Region  THERAPY DIAG:   Chronic right-sided low back pain, unspecified whether sciatica present   Radiculopathy, lumbosacral region   Difficulty in walking, not elsewhere classified  No diagnosis found.  Rationale for Evaluation and Treatment Rehabilitation  PERTINENT HISTORY:  Pt reports chronic low back pain with radiation into the right hip and Rt posterior leg. She is a limited historian due to memory recall deficits. Lumbar surgery has been recommended, however insurance is mandating a 6 week trial of PT. Lumbar surgical history includes: PLIF L4-5, L2-08 Apr 2016, L2/3 & L3/07 Dec 2015, Rt L5-S1 Microdiskectomy/laminectomy 03/01/21  PRECAUTIONS: Fall Risk   SUBJECTIVE:   SUBJECTIVE STATEMENT:  *** Pt reports she currently has a cold and has been coughing a lot. She has scheduled an appt with her physician to address this. Pt reports her pain level is currently a 2/10 felt more on R side of her low back and running down her RLE. Reports HEP is not going well, she reports she keeps forgetting to do them.  PAIN:  Are you having pain? Yes: NPRS scale: 2/10 Pain location: R side low back Pain description: none     TODAY'S TREATMENT:      *** TherEx: Nustep interval training, seat 4, lvls 1, 3, and 5  performed in 1 and 2 min intervals for a total of  Cuing for SPM >60 throughout. PT adjusts intensity as appropriate to maintain therapeutic range and monitors pt for response throughout. Pt performs a total of 6 minutes. Very fatigued with lvl 5.   STS 1x12, hands free. Rates medium STS 1x10, hands free. Fatiguing STS 1x8, hands free with pt holding 1000 gr ball. Discontinued due to increase in LBP  Overground AMB 3 laps (1 lap=148 ft) no device with 2.5# AW donned each LE. Pt fatigues, rates medium.  2.5# AW each LE: Seated march 2x20 alt LE. Rates easy  LAQ 2x20 alt LE. Rates hard   Seated BTB hip abd/ER 2x20 BLE  Physioball FWD/BCKWD rollouts 30x  NMR:  FWD/BCKWD and LTL step-overs (orange hurdle as obstacle) with and without UE support. Pt completes multiple reps of each, multiple errors, uses step-strategy to correct LOB.   PATIENT EDUCATION: Education details: Pt educated throughout session about proper posture and  technique with exercises. Improved exercise technique, movement at target joints, use of target muscles after min to mod verbal, visual, tactile cues.  Person educated: Patient Education method: Explanation, Tactile cues, and Verbal cues Education comprehension: verbalized understanding, returned demonstration, verbal cues required, and tactile cues required    HOME EXERCISE PROGRAM: *below in "Home exercise section"    Plan - 02/26/22 0855     Clinical Impression Statement *** Discussed with pt ways to incorporate HEP into daily activities as pt still reports noncompliance with HEP. Pt still somewhat pain-limited throughout session, but otherwise tolerates interventions well. She does generally rate exercises as medium intensity and fatiguing. The pt will continue to benefit from skilled therapy to address remaining deficits and achieve goals of care.   Personal Factors and Comorbidities Comorbidity 3+;Age;Fitness;Past/Current Experience;Time since onset of  injury/illness/exacerbation    Comorbidities memory impairment, anxiety, depression, DM, hx of kidney stones, obesity, RA    Examination-Activity Limitations Bed Mobility;Stand;Lift;Locomotion Level;Bend;Transfers;Carry;Continence;Sit;Sleep;Stairs;Squat    Examination-Participation Restrictions Church;Meal Prep;Cleaning;Community Activity;Driving;Interpersonal Relationship;Laundry;Yard Work    Conservation officer, historic buildings Evolving/Moderate complexity    Rehab Potential Poor    PT Frequency 2x / week    PT Duration 6 weeks    PT Treatment/Interventions Balance training;Neuromuscular re-education;Therapeutic activities;Patient/family education;Manual techniques;Therapeutic exercise;Electrical Stimulation;Cryotherapy;Moist Heat;Stair training;Functional mobility training;DME Instruction;Gait training;Passive range of motion;Dry needling    PT Next Visit Plan Continue with work toward goal achievement    PT Home Exercise Plan Access Code: [email or web address] URL: [email or web address] Date: 03/06/2022 Prepared by: Alvera Novel  Exercises - Supine Hip Adduction Isometric with Ball  - 1 x daily - 5 x weekly - 3 sets - 15 reps - 3 seconds hold - Sit to Stand with Counter Support  - 1 x daily - 5 x weekly - 3 sets - 10 reps - Seated Long Arc Quad  - 1 x daily - 5 x weekly - 3 sets - 10 reps - 3 hold - Side Stepping with Counter Support  - 1 x daily - 5 x weekly - 3 sets - 20 reps   Consulted and Agree with Plan of Care Patient                 PT Short Term Goals - 02/25/22 0903       PT SHORT TERM GOAL #1   Title Pt will be independent with her initial HEP to decrease pain, improve strength, function, and ability to perform transfers more comfortably and with less difficulty.  03/11/22: limited by ST memory deficits;    Time 2    Period Weeks    Status Ongoing    Target Date 03/11/22      PT SHORT TERM GOAL #2   Title Pt will demonstrate improved tolerance to overground  AMB by> 573ft without pain limitation:    Baseline eval: <322ft; 03/11/22: 430ft without pain exacerbation, no AD, then 740ft c 4WW without pain exacerbation.    Time 3    Period Weeks    Status On-going    Target Date 03/18/22      PT SHORT TERM GOAL #3   Title Pt to demonstrate improved tolerance to power actiivties AEB 5xSTS <15 secs    Baseline Eval: 19sec; 03/11/22: 11.45sec    Time 3    Period Weeks    Status Coming along     Target Date 03/18/22              PT Long Term Goals - 02/25/22 8938  PT LONG TERM GOAL #1   Title Pt to improve score on FOTO survery by >8 points to indicate improved self efficicacy in ADL/IADL mobility.    Baseline Eval: 40    Time 6    Period Weeks    Status New    Target Date 04/08/22      PT LONG TERM GOAL #2   Title Pt to demonstrate improved standing tolerance at home to >20 minutes.    Baseline eval: 5-10 minutes    Time 6    Period Weeks    Status New    Target Date 04/08/22      PT LONG TERM GOAL #3   Title Pt to demonstrate tolerance to without need to sit during testing, performance >1023ft.    Baseline eval: tolerates less than 2 minutes of walking.  03/11/22: c 4WW AMB 53m30s without pain exacerbation (741ft)    Time 6    Period Weeks    Status New    Target Date 04/08/22             Nolon Bussing, PT, DPT Physical Therapist- [facility]  03/25/22, 9:30 AM   Physical Therapist - [facility]  Outpatient Physical Therapy- Main Campus [phone]     Physical Therapist- [facility]  03/25/22, 9:30 AM

## 2022-03-25 NOTE — Therapy (Unsigned)
OUTPATIENT PHYSICAL THERAPY TREATMENT NOTE   Patient Name: Jessica Cole MRN: [id number] DOB:04-16-1951, 71 y.o., female Today's Date: 03/26/2022  PCP: Lynnea Ferrier, MD REFERRING PROVIDER: Tia Alert, MD   PT End of Session - 03/26/22 1117     Visit Number 7    Number of Visits 12    Date for PT Re-Evaluation 04/07/22    Authorization Type [employer]    Authorization Time Period 02/24/22-04/07/22    Progress Note Due on Visit 10    PT Start Time 1445    PT Stop Time 1514    PT Time Calculation (min) 29 min    Equipment Utilized During Treatment Gait belt    Activity Tolerance Patient tolerated treatment well;Patient limited by pain    Behavior During Therapy WFL for tasks assessed/performed              Past Medical History:  Diagnosis Date   Anxiety    Asthma    seasonal with allergies   Colon polyps    Degenerative arthritis    Depression    Diabetes mellitus without complication (HCC)    Type II   Environmental allergies    Family history of adverse reaction to anesthesia    sister- PONV   Fatty liver    Glaucoma    Headache(784.0)    HX  MIGRAINES   History of bronchitis    History of kidney stones    History of pneumonia    Hypertension    Hypothyroidism    Obesity    OSA on CPAP    Plantar fasciitis    right   PONV (postoperative nausea and vomiting)    pt has never had any anesthesia complications, no post-op nausea or vomiting   Renal calculi    Renal calculi    Rheumatoid arthritis (HCC)    RLS (restless legs syndrome) 08/23/2014     Past Surgical History:  Procedure Laterality Date   ABDOMINAL HYSTERECTOMY     partial   APPENDECTOMY     Arthroscopic surgery, knee Left    BREAST BIOPSY Right    several   BREAST BIOPSY Right 03/11/2017   u/s bx neg   BUNIONECTOMY     LEFT    COLONOSCOPY W/ POLYPECTOMY     COLONOSCOPY WITH PROPOFOL N/A 08/14/2017   Procedure: COLONOSCOPY WITH PROPOFOL;  Surgeon: Christena Deem,  MD;  Location: [facility] ENDOSCOPY;  Service: Endoscopy;  Laterality: N/A;   EXTRACORPOREAL SHOCK WAVE LITHOTRIPSY Left 05/14/2017   Procedure: EXTRACORPOREAL SHOCK WAVE LITHOTRIPSY (ESWL);  Surgeon: Riki Altes, MD;  Location: [facility] ORS;  Service: Urology;  Laterality: Left;   EXTRACORPOREAL SHOCK WAVE LITHOTRIPSY Left 06/14/2020   Procedure: EXTRACORPOREAL SHOCK WAVE LITHOTRIPSY (ESWL);  Surgeon: Sondra Come, MD;  Location: [facility] ORS;  Service: Urology;  Laterality: Left;   FOOT SURGERY     RIGHT     KNEE ARTHROSCOPY W/ MENISCAL REPAIR Left    LASIK     LUMBAR LAMINECTOMY/DECOMPRESSION MICRODISCECTOMY Right 03/01/2021   Procedure: Microdiscectomy - Lumbar five-Sacral one - right;  Surgeon: Tia Alert, MD;  Location: [facility] OR;  Service: Neurosurgery;  Laterality: Right;   MAXIMUM ACCESS (MAS)POSTERIOR LUMBAR INTERBODY FUSION (PLIF) 1 LEVEL N/A 04/25/2016   Procedure: LUMBAR FOUR-FIVE  MAXIMUM ACCESS (MAS) POSTERIOR LUMBAR INTERBODY FUSION (PLIF) with extension of instrumentation LUMBAR TWO-FIVE;  Surgeon: Tia Alert, MD;  Location: [facility] OR;  Service: Neurosurgery;  Laterality: N/A;   MAXIMUM ACCESS (  MAS)POSTERIOR LUMBAR INTERBODY FUSION (PLIF) 2 LEVEL N/A 12/13/2015   Procedure: Lumbar two-three - Lumbar three-four MAXIMUM ACCESS (MAS) POSTERIOR LUMBAR INTERBODY FUSION (PLIF)  ;  Surgeon: Tia Alertavid S Jones, MD;  [facility];  Service: Neurosurgery;  Laterality: N/A;   REVERSE SHOULDER ARTHROPLASTY Left 03/01/2020   Procedure: REVERSE SHOULDER ARTHROPLASTY;  Surgeon: Christena FlakePoggi, John J, MD;  Location: [facility] ORS;  Service: Orthopedics;  Laterality: Left;   SHOULDER SURGERY     RIGHT    TONSILLECTOMY     Patient Active Problem List   Diagnosis Date Noted   S/P lumbar laminectomy 03/01/2021   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) 01/03/2019   Chronic cough 11/19/2016   Morbid obesity (HCC) 09/26/2016    Left ovarian cyst 05/30/2016   S/P lumbar spinal fusion 12/13/2015   Aortic calcification (HCC) 07/18/2015   Controlled type 2 diabetes mellitus without complication (HCC) 05/16/2015   Thrombocytopenia (HCC) 05/16/2015   Recurrent major depressive disorder, in full remission (HCC) 05/16/2015   Essential (primary) hypertension 05/16/2015   Fatty infiltration of liver 03/15/2015   Aortic valve stenosis, nonrheumatic 01/18/2015   Sciatica of right side 01/09/2015   Type 2 diabetes mellitus (HCC) 11/10/2014   RLS (restless legs syndrome) 08/23/2014   Neuritis or radiculitis due to rupture of lumbar intervertebral disc 06/13/2014   Degeneration of intervertebral disc of lumbar region 06/13/2014   Lumbar radiculitis 06/13/2014   Arthritis 01/23/2014   Arthritis of knee, degenerative 11/15/2013   Depression 09/29/2013   Insomnia 09/29/2013   Apnea, sleep 08/29/2013   History of nephrolithiasis 08/29/2013   Abnormal presence of protein in urine 08/29/2013   Headache, migraine 08/29/2013   BP (high blood pressure) 08/29/2013   HLD (hyperlipidemia) 08/29/2013   Allergic rhinitis 08/29/2013   Intractable migraine without aura 02/18/2013    REFERRING DIAG: Radiculopathy, Lumbosacral Region  THERAPY DIAG:   Chronic right-sided low back pain, unspecified whether sciatica present   Radiculopathy, lumbosacral region   Difficulty in walking, not elsewhere classified  Chronic right-sided low back pain, unspecified whether sciatica present  Muscle weakness (generalized)  Rationale for Evaluation and Treatment Rehabilitation  PERTINENT HISTORY:  Pt reports chronic low back pain with radiation into the right hip and Rt posterior leg. She is a limited historian due to memory recall deficits. Lumbar surgery has been recommended, however insurance is mandating a 6 week trial of PT. Lumbar surgical history includes: PLIF L4-5, L2-08 Apr 2016, L2/3 & L3/07 Dec 2015, Rt L5-S1  Microdiskectomy/laminectomy 03/01/21  PRECAUTIONS: Fall Risk   SUBJECTIVE:   SUBJECTIVE STATEMENT: Pt having some urinary incontinence issues (not new) forgot her medicine for this prior to appointment. Missed last week's appointment due to COVID. Rates LBP 3/10. Reports more fatigued since COVID.  PAIN:  Are you having pain? Yes: NPRS scale: 3/10 Pain location: R side low back Pain description: none     TODAY'S TREATMENT:       TherEx:  STS 3x10. Rates medium, fatiguing.  Seated BP LUE: 134/78 mmHg, HR 82 bpm - monitored today due to increased fatigue since covid   2.5# AW each LE: LAQ 20x 10x each LE. Rates hard  Seated march 2x20 alt LE. Rates medium    Nustep interval training, seat 4, lvls 1, 2, and 3 performed in 1 min intervals for a total of 5 minutes Cuing for SPM >60 throughout. PT adjusts intensity as appropriate to maintain therapeutic range and  monitors pt for response throughout. Pt visibly fatigued.    Physioball FWD/BCKWD mild discomfort 10x, attempted LTL for 5 but discontinued due to pain   PATIENT EDUCATION: Education details: Pt educated throughout session about proper posture and technique with exercises. Improved exercise technique, movement at target joints, use of target muscles after min to mod verbal, visual, tactile cues.  Person educated: Patient Education method: Explanation, Tactile cues, and Verbal cues Education comprehension: verbalized understanding, returned demonstration, verbal cues required, and tactile cues required    HOME EXERCISE PROGRAM: *below in "Home exercise section"    Plan - 02/26/22 0855     Clinical Impression Statement Session limited secondary to pt late arrival and pt still feeling somewhat unwell since COVID infection. She continues to be pain-limited with interventions due to R side LBP.  Will continue to monitor levels of fatigue with interventions. The pt will continue to benefit from skilled therapy to  address remaining deficits and achieve goals of care.   Personal Factors and Comorbidities Comorbidity 3+;Age;Fitness;Past/Current Experience;Time since onset of injury/illness/exacerbation    Comorbidities memory impairment, anxiety, depression, DM, hx of kidney stones, obesity, RA    Examination-Activity Limitations Bed Mobility;Stand;Lift;Locomotion Level;Bend;Transfers;Carry;Continence;Sit;Sleep;Stairs;Squat    Examination-Participation Restrictions Church;Meal Prep;Cleaning;Community Activity;Driving;Interpersonal Relationship;Laundry;Yard Work    Conservation officer, historic buildings Evolving/Moderate complexity    Rehab Potential Poor    PT Frequency 2x / week    PT Duration 6 weeks    PT Treatment/Interventions Balance training;Neuromuscular re-education;Therapeutic activities;Patient/family education;Manual techniques;Therapeutic exercise;Electrical Stimulation;Cryotherapy;Moist Heat;Stair training;Functional mobility training;DME Instruction;Gait training;Passive range of motion;Dry needling    PT Next Visit Plan Continue with work toward goal achievement    PT Home Exercise Plan Access Code: [email or web address] URL: [email or web address] Date: 03/06/2022 Prepared by: Alvera Novel  Exercises - Supine Hip Adduction Isometric with Ball  - 1 x daily - 5 x weekly - 3 sets - 15 reps - 3 seconds hold - Sit to Stand with Counter Support  - 1 x daily - 5 x weekly - 3 sets - 10 reps - Seated Long Arc Quad  - 1 x daily - 5 x weekly - 3 sets - 10 reps - 3 hold - Side Stepping with Counter Support  - 1 x daily - 5 x weekly - 3 sets - 20 reps   Consulted and Agree with Plan of Care Patient                 PT Short Term Goals - 02/25/22 0903       PT SHORT TERM GOAL #1   Title Pt will be independent with her initial HEP to decrease pain, improve strength, function, and ability to perform transfers more comfortably and with less difficulty.  03/11/22: limited by ST memory deficits;     Time 2    Period Weeks    Status Ongoing    Target Date 03/11/22      PT SHORT TERM GOAL #2   Title Pt will demonstrate improved tolerance to overground AMB by> 527ft without pain limitation:    Baseline eval: <334ft; 03/11/22: 447ft without pain exacerbation, no AD, then 756ft c 4WW without pain exacerbation.    Time 3    Period Weeks    Status On-going    Target Date 03/18/22      PT SHORT TERM GOAL #3   Title Pt to demonstrate improved tolerance to power actiivties AEB 5xSTS <15 secs    Baseline Eval: 19sec; 03/11/22: 11.45sec    Time 3  Period Weeks    Status Coming along     Target Date 03/18/22              PT Long Term Goals - 02/25/22 0904       PT LONG TERM GOAL #1   Title Pt to improve score on FOTO survery by >8 points to indicate improved self efficicacy in ADL/IADL mobility.    Baseline Eval: 40    Time 6    Period Weeks    Status New    Target Date 04/08/22      PT LONG TERM GOAL #2   Title Pt to demonstrate improved standing tolerance at home to >20 minutes.    Baseline eval: 5-10 minutes    Time 6    Period Weeks    Status New    Target Date 04/08/22      PT LONG TERM GOAL #3   Title Pt to demonstrate tolerance to without need to sit during testing, performance >103ft.    Baseline eval: tolerates less than 2 minutes of walking.  03/11/22: c 4WW AMB 51m30s without pain exacerbation (731ft)    Time 6    Period Weeks    Status New    Target Date 04/08/22              11:21 AM, 03/26/22 Temple Pacini PT, DPT  Physical Therapist - [facility]  Outpatient Physical Therapy- Main Campus [phone]     Physical Therapist- [facility]  03/26/22, 11:21 AM

## 2022-03-26 ENCOUNTER — Ambulatory Visit: Payer: Medicare HMO | Admitting: Physical Therapy

## 2022-03-26 DIAGNOSIS — M6281 Muscle weakness (generalized): Secondary | ICD-10-CM

## 2022-03-26 DIAGNOSIS — M545 Low back pain, unspecified: Secondary | ICD-10-CM | POA: Diagnosis not present

## 2022-03-26 DIAGNOSIS — G8929 Other chronic pain: Secondary | ICD-10-CM

## 2022-03-26 DIAGNOSIS — R262 Difficulty in walking, not elsewhere classified: Secondary | ICD-10-CM

## 2022-03-26 NOTE — Therapy (Signed)
OUTPATIENT PHYSICAL THERAPY TREATMENT NOTE   Patient Name: Jessica Cole MRN: [id number] DOB:March 14, 1951, 71 y.o., female Today's Date: 03/26/2022  PCP: Lynnea Ferrier, MD REFERRING PROVIDER: Tia Alert, MD   PT End of Session - 03/26/22 1242     Visit Number 8    Number of Visits 12    Date for PT Re-Evaluation 04/07/22    Authorization Type [employer]    Authorization Time Period 02/24/22-04/07/22    Progress Note Due on Visit 10    PT Start Time 1300    PT Stop Time 1343    PT Time Calculation (min) 43 min    Equipment Utilized During Treatment Gait belt    Activity Tolerance Patient tolerated treatment well;Patient limited by pain    Behavior During Therapy WFL for tasks assessed/performed              Past Medical History:  Diagnosis Date   Anxiety    Asthma    seasonal with allergies   Colon polyps    Degenerative arthritis    Depression    Diabetes mellitus without complication (HCC)    Type II   Environmental allergies    Family history of adverse reaction to anesthesia    sister- PONV   Fatty liver    Glaucoma    Headache(784.0)    HX  MIGRAINES   History of bronchitis    History of kidney stones    History of pneumonia    Hypertension    Hypothyroidism    Obesity    OSA on CPAP    Plantar fasciitis    right   PONV (postoperative nausea and vomiting)    pt has never had any anesthesia complications, no post-op nausea or vomiting   Renal calculi    Renal calculi    Rheumatoid arthritis (HCC)    RLS (restless legs syndrome) 08/23/2014     Past Surgical History:  Procedure Laterality Date   ABDOMINAL HYSTERECTOMY     partial   APPENDECTOMY     Arthroscopic surgery, knee Left    BREAST BIOPSY Right    several   BREAST BIOPSY Right 03/11/2017   u/s bx neg   BUNIONECTOMY     LEFT    COLONOSCOPY W/ POLYPECTOMY     COLONOSCOPY WITH PROPOFOL N/A 08/14/2017   Procedure: COLONOSCOPY WITH PROPOFOL;  Surgeon: Christena Deem,  MD;  Location: [facility] ENDOSCOPY;  Service: Endoscopy;  Laterality: N/A;   EXTRACORPOREAL SHOCK WAVE LITHOTRIPSY Left 05/14/2017   Procedure: EXTRACORPOREAL SHOCK WAVE LITHOTRIPSY (ESWL);  Surgeon: Riki Altes, MD;  Location: [facility] ORS;  Service: Urology;  Laterality: Left;   EXTRACORPOREAL SHOCK WAVE LITHOTRIPSY Left 06/14/2020   Procedure: EXTRACORPOREAL SHOCK WAVE LITHOTRIPSY (ESWL);  Surgeon: Sondra Come, MD;  Location: [facility] ORS;  Service: Urology;  Laterality: Left;   FOOT SURGERY     RIGHT     KNEE ARTHROSCOPY W/ MENISCAL REPAIR Left    LASIK     LUMBAR LAMINECTOMY/DECOMPRESSION MICRODISCECTOMY Right 03/01/2021   Procedure: Microdiscectomy - Lumbar five-Sacral one - right;  Surgeon: Tia Alert, MD;  Location: [facility] OR;  Service: Neurosurgery;  Laterality: Right;   MAXIMUM ACCESS (MAS)POSTERIOR LUMBAR INTERBODY FUSION (PLIF) 1 LEVEL N/A 04/25/2016   Procedure: LUMBAR FOUR-FIVE  MAXIMUM ACCESS (MAS) POSTERIOR LUMBAR INTERBODY FUSION (PLIF) with extension of instrumentation LUMBAR TWO-FIVE;  Surgeon: Tia Alert, MD;  Location: [facility] OR;  Service: Neurosurgery;  Laterality: N/A;   MAXIMUM ACCESS (  MAS)POSTERIOR LUMBAR INTERBODY FUSION (PLIF) 2 LEVEL N/A 12/13/2015   Procedure: Lumbar two-three - Lumbar three-four MAXIMUM ACCESS (MAS) POSTERIOR LUMBAR INTERBODY FUSION (PLIF)  ;  Surgeon: Tia Alert, MD;  Location: [facility] NEURO ORS;  Service: Neurosurgery;  Laterality: N/A;   REVERSE SHOULDER ARTHROPLASTY Left 03/01/2020   Procedure: REVERSE SHOULDER ARTHROPLASTY;  Surgeon: Christena Flake, MD;  Location: [facility] ORS;  Service: Orthopedics;  Laterality: Left;   SHOULDER SURGERY     RIGHT    TONSILLECTOMY     Patient Active Problem List   Diagnosis Date Noted   S/P lumbar laminectomy 03/01/2021   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) 01/03/2019   Chronic cough 11/19/2016   Morbid obesity (HCC) 09/26/2016    Left ovarian cyst 05/30/2016   S/P lumbar spinal fusion 12/13/2015   Aortic calcification (HCC) 07/18/2015   Controlled type 2 diabetes mellitus without complication (HCC) 05/16/2015   Thrombocytopenia (HCC) 05/16/2015   Recurrent major depressive disorder, in full remission (HCC) 05/16/2015   Essential (primary) hypertension 05/16/2015   Fatty infiltration of liver 03/15/2015   Aortic valve stenosis, nonrheumatic 01/18/2015   Sciatica of right side 01/09/2015   Type 2 diabetes mellitus (HCC) 11/10/2014   RLS (restless legs syndrome) 08/23/2014   Neuritis or radiculitis due to rupture of lumbar intervertebral disc 06/13/2014   Degeneration of intervertebral disc of lumbar region 06/13/2014   Lumbar radiculitis 06/13/2014   Arthritis 01/23/2014   Arthritis of knee, degenerative 11/15/2013   Depression 09/29/2013   Insomnia 09/29/2013   Apnea, sleep 08/29/2013   History of nephrolithiasis 08/29/2013   Abnormal presence of protein in urine 08/29/2013   Headache, migraine 08/29/2013   BP (high blood pressure) 08/29/2013   HLD (hyperlipidemia) 08/29/2013   Allergic rhinitis 08/29/2013   Intractable migraine without aura 02/18/2013    REFERRING DIAG: Radiculopathy, Lumbosacral Region  THERAPY DIAG:   Chronic right-sided low back pain, unspecified whether sciatica present   Radiculopathy, lumbosacral region   Difficulty in walking, not elsewhere classified  Muscle weakness (generalized)  Difficulty in walking, not elsewhere classified  Chronic bilateral low back pain, unspecified whether sciatica present  Chronic right-sided low back pain, unspecified whether sciatica present  Rationale for Evaluation and Treatment Rehabilitation  PERTINENT HISTORY:  Pt reports chronic low back pain with radiation into the right hip and Rt posterior leg. She is a limited historian due to memory recall deficits. Lumbar surgery has been recommended, however insurance is mandating a 6 week  trial of PT. Lumbar surgical history includes: PLIF L4-5, L2-08 Apr 2016, L2/3 & L3/07 Dec 2015, Rt L5-S1 Microdiskectomy/laminectomy 03/01/21  PRECAUTIONS: Fall Risk   SUBJECTIVE:   SUBJECTIVE STATEMENT: Pt doing well with a little soreness from last session. Back pain present but slight.   PAIN:  Are you having pain? Yes: NPRS scale: 3/10 Pain location: R side low back Pain description: none     TODAY'S TREATMENT:       TherEx:  Nustep interval training, seat 4, lvls 1, 2, and 3 performed in 1 min intervals for a total of 6 minutes -Cuing for SPM >60 throughout. PT adjusts intensity as appropriate to maintain therapeutic range and monitors pt for response throughout. Pt visibly fatigued.   STS 3x10. Rates medium, fatiguing.  2.5# AW each LE:  LAQ 20x 10x each LE. Rates hard   Ambulation with Rollator x 450 feet medium effort RPE 4   Seated march x20  alt LE. Rates medium, some discomfort felt in low back with increased reps   Ambulation with rollator x 600 feet medium effort   Seated hurdle step over 2 x 10   Ambulation x 600 feet with RW, medium effort   Lateral step ups 2 x 10 ea LE with step trainer.   Physioball FWD/BCKWD mild discomfort 10x   PATIENT EDUCATION: Education details: Pt educated throughout session about proper posture and technique with exercises. Improved exercise technique, movement at target joints, use of target muscles after min to mod verbal, visual, tactile cues.  Person educated: Patient Education method: Explanation, Tactile cues, and Verbal cues Education comprehension: verbalized understanding, returned demonstration, verbal cues required, and tactile cues required    HOME EXERCISE PROGRAM: *below in "Home exercise section"    Plan - 02/26/22 0855     Clinical Impression Statement Continued with current plan of care as laid out in evaluation and recent prior sessions. Pt remains motivated to advance progress toward goals in order  to maximize independence and safety at home. Pt requires high level assistance and cuing for completion of exercises in order to provide adequate level of stimulation challenge while minimizing pain and discomfort when possible. Pt closely monitored throughout session pt response and to maximize patient safety during interventions. Pt had slight increase in pain with hip abduction activation activities and hip flexion activities with fatigue. Pt continues to demonstrate progress toward goals AEB progression of interventions this date either in volume or intensity.    Personal Factors and Comorbidities Comorbidity 3+;Age;Fitness;Past/Current Experience;Time since onset of injury/illness/exacerbation    Comorbidities memory impairment, anxiety, depression, DM, hx of kidney stones, obesity, RA    Examination-Activity Limitations Bed Mobility;Stand;Lift;Locomotion Level;Bend;Transfers;Carry;Continence;Sit;Sleep;Stairs;Squat    Examination-Participation Restrictions Church;Meal Prep;Cleaning;Community Activity;Driving;Interpersonal Relationship;Laundry;Yard Work    Conservation officer, historic buildings Evolving/Moderate complexity    Rehab Potential Poor    PT Frequency 2x / week    PT Duration 6 weeks    PT Treatment/Interventions Balance training;Neuromuscular re-education;Therapeutic activities;Patient/family education;Manual techniques;Therapeutic exercise;Electrical Stimulation;Cryotherapy;Moist Heat;Stair training;Functional mobility training;DME Instruction;Gait training;Passive range of motion;Dry needling    PT Next Visit Plan Continue with work toward goal achievement    PT Home Exercise Plan Access Code: [email or web address] URL: [email or web address] Date: 03/06/2022 Prepared by: Alvera Novel  Exercises - Supine Hip Adduction Isometric with Ball  - 1 x daily - 5 x weekly - 3 sets - 15 reps - 3 seconds hold - Sit to Stand with Counter Support  - 1 x daily - 5 x weekly - 3 sets - 10 reps -  Seated Long Arc Quad  - 1 x daily - 5 x weekly - 3 sets - 10 reps - 3 hold - Side Stepping with Counter Support  - 1 x daily - 5 x weekly - 3 sets - 20 reps   Consulted and Agree with Plan of Care Patient                 PT Short Term Goals - 02/25/22 0903       PT SHORT TERM GOAL #1   Title Pt will be independent with her initial HEP to decrease pain, improve strength, function, and ability to perform transfers more comfortably and with less difficulty.  03/11/22: limited by ST memory deficits;    Time 2    Period Weeks    Status Ongoing    Target Date 03/11/22      PT SHORT TERM GOAL #2   Title Pt will demonstrate improved tolerance  to overground AMB by> 55100ft without pain limitation:    Baseline eval: <36800ft; 03/11/22: 46550ft without pain exacerbation, no AD, then 7150ft c 4WW without pain exacerbation.    Time 3    Period Weeks    Status On-going    Target Date 03/18/22      PT SHORT TERM GOAL #3   Title Pt to demonstrate improved tolerance to power actiivties AEB 5xSTS <15 secs    Baseline Eval: 19sec; 03/11/22: 11.45sec    Time 3    Period Weeks    Status Coming along     Target Date 03/18/22              PT Long Term Goals - 02/25/22 0904       PT LONG TERM GOAL #1   Title Pt to improve score on FOTO survery by >8 points to indicate improved self efficicacy in ADL/IADL mobility.    Baseline Eval: 40    Time 6    Period Weeks    Status New    Target Date 04/08/22      PT LONG TERM GOAL #2   Title Pt to demonstrate improved standing tolerance at home to >20 minutes.    Baseline eval: 5-10 minutes    Time 6    Period Weeks    Status New    Target Date 04/08/22      PT LONG TERM GOAL #3   Title Pt to demonstrate tolerance to 6MWT without need to sit during testing, performance >107000ft.    Baseline eval: tolerates less than 2 minutes of walking.  03/11/22: c 4WW AMB 604m30s without pain exacerbation (79450ft)    Time 6    Period Weeks    Status New     Target Date 04/08/22              3:25 PM, 03/26/22 Norman Herrlichhristopher B Felina Tello PT    Physical Therapist- [facility]  [facility]  03/26/22, 3:25 PM

## 2022-04-01 ENCOUNTER — Ambulatory Visit: Payer: Medicare HMO

## 2022-04-01 DIAGNOSIS — R42 Dizziness and giddiness: Secondary | ICD-10-CM

## 2022-04-01 DIAGNOSIS — R2681 Unsteadiness on feet: Secondary | ICD-10-CM

## 2022-04-01 DIAGNOSIS — G8929 Other chronic pain: Secondary | ICD-10-CM

## 2022-04-01 DIAGNOSIS — M6281 Muscle weakness (generalized): Secondary | ICD-10-CM

## 2022-04-01 DIAGNOSIS — M545 Low back pain, unspecified: Secondary | ICD-10-CM | POA: Diagnosis not present

## 2022-04-01 DIAGNOSIS — R262 Difficulty in walking, not elsewhere classified: Secondary | ICD-10-CM

## 2022-04-01 NOTE — Therapy (Signed)
OUTPATIENT PHYSICAL THERAPY TREATMENT NOTE/RECERT   Patient Name: Jessica Cole MRN: [id number] DOB:12/13/1950, 71 y.o., female Today's Date: 04/01/2022  PCP: Klein, Bert J III, MD REFERRING PROVIDER: Jones, David S, MD   PT End of Session - 04/01/22 0848     Visit Number 9    Number of Visits 12    Date for PT Re-Evaluation 05/13/22    Authorization Type [employer]    Authorization Time Period 02/24/22-04/07/22    Progress Note Due on Visit 10    PT Start Time 0847    PT Stop Time 0940    PT Time Calculation (min) 53 min    Equipment Utilized During Treatment Gait belt    Activity Tolerance Patient tolerated treatment well;Patient limited by pain    Behavior During Therapy WFL for tasks assessed/performed               Past Medical History:  Diagnosis Date   Anxiety    Asthma    seasonal with allergies   Colon polyps    Degenerative arthritis    Depression    Diabetes mellitus without complication (HCC)    Type II   Environmental allergies    Family history of adverse reaction to anesthesia    sister- PONV   Fatty liver    Glaucoma    Headache(784.0)    HX  MIGRAINES   History of bronchitis    History of kidney stones    History of pneumonia    Hypertension    Hypothyroidism    Obesity    OSA on CPAP    Plantar fasciitis    right   PONV (postoperative nausea and vomiting)    pt has never had any anesthesia complications, no post-op nausea or vomiting   Renal calculi    Renal calculi    Rheumatoid arthritis (HCC)    RLS (restless legs syndrome) 08/23/2014     Past Surgical History:  Procedure Laterality Date   ABDOMINAL HYSTERECTOMY     partial   APPENDECTOMY     Arthroscopic surgery, knee Left    BREAST BIOPSY Right    several   BREAST BIOPSY Right 03/11/2017   u/s bx neg   BUNIONECTOMY     LEFT    COLONOSCOPY W/ POLYPECTOMY     COLONOSCOPY WITH PROPOFOL N/A 08/14/2017   Procedure: COLONOSCOPY WITH PROPOFOL;  Surgeon: Skulskie,  Martin U, MD;  Location: [facility];  Service: Endoscopy;  Laterality: N/A;   EXTRACORPOREAL SHOCK WAVE LITHOTRIPSY Left 05/14/2017   Procedure: EXTRACORPOREAL SHOCK WAVE LITHOTRIPSY (ESWL);  Surgeon: Stoioff, Scott C, MD;  Location: [facility];  Service: Urology;  Laterality: Left;   EXTRACORPOREAL SHOCK WAVE LITHOTRIPSY Left 06/14/2020   Procedure: EXTRACORPOREAL SHOCK WAVE LITHOTRIPSY (ESWL);  Surgeon: Sninsky, Brian C, MD;  Location: [facility];  Service: Urology;  Laterality: Left;   FOOT SURGERY     RIGHT     KNEE ARTHROSCOPY W/ MENISCAL REPAIR Left    LASIK     LUMBAR LAMINECTOMY/DECOMPRESSION MICRODISCECTOMY Right 03/01/2021   Procedure: Microdiscectomy - Lumbar five-Sacral one - right;  Surgeon: Jones, David S, MD;  Location: [facility];  Service: Neurosurgery;  Laterality: Right;   MAXIMUM ACCESS (MAS)POSTERIOR LUMBAR INTERBODY FUSION (PLIF) 1 LEVEL N/A 04/25/2016   Procedure: LUMBAR FOUR-FIVE  MAXIMUM ACCESS (MAS) POSTERIOR LUMBAR INTERBODY FUSION (PLIF) with extension of instrumentation LUMBAR TWO-FIVE;  Surgeon: David S Jones, MD;  Location: [facility];  Service: Neurosurgery;  Laterality: N/A;   MAXIMUM   ACCESS (MAS)POSTERIOR LUMBAR INTERBODY FUSION (PLIF) 2 LEVEL N/A 12/13/2015   Procedure: Lumbar two-three - Lumbar three-four MAXIMUM ACCESS (MAS) POSTERIOR LUMBAR INTERBODY FUSION (PLIF)  ;  Surgeon: David S Jones, MD;  Location: [facility];  Service: Neurosurgery;  Laterality: N/A;   REVERSE SHOULDER ARTHROPLASTY Left 03/01/2020   Procedure: REVERSE SHOULDER ARTHROPLASTY;  Surgeon: Poggi, John J, MD;  Location: [facility];  Service: Orthopedics;  Laterality: Left;   SHOULDER SURGERY     RIGHT    TONSILLECTOMY     Patient Active Problem List   Diagnosis Date Noted   S/P lumbar laminectomy 03/01/2021   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) 01/03/2019   Chronic cough 11/19/2016   Morbid obesity (HCC)  09/26/2016   Left ovarian cyst 05/30/2016   S/P lumbar spinal fusion 12/13/2015   Aortic calcification (HCC) 07/18/2015   Controlled type 2 diabetes mellitus without complication (HCC) 05/16/2015   Thrombocytopenia (HCC) 05/16/2015   Recurrent major depressive disorder, in full remission (HCC) 05/16/2015   Essential (primary) hypertension 05/16/2015   Fatty infiltration of liver 03/15/2015   Aortic valve stenosis, nonrheumatic 01/18/2015   Sciatica of right side 01/09/2015   Type 2 diabetes mellitus (HCC) 11/10/2014   RLS (restless legs syndrome) 08/23/2014   Neuritis or radiculitis due to rupture of lumbar intervertebral disc 06/13/2014   Degeneration of intervertebral disc of lumbar region 06/13/2014   Lumbar radiculitis 06/13/2014   Arthritis 01/23/2014   Arthritis of knee, degenerative 11/15/2013   Depression 09/29/2013   Insomnia 09/29/2013   Apnea, sleep 08/29/2013   History of nephrolithiasis 08/29/2013   Abnormal presence of protein in urine 08/29/2013   Headache, migraine 08/29/2013   BP (high blood pressure) 08/29/2013   HLD (hyperlipidemia) 08/29/2013   Allergic rhinitis 08/29/2013   Intractable migraine without aura 02/18/2013    REFERRING DIAG: Radiculopathy, Lumbosacral Region  THERAPY DIAG:   Chronic right-sided low back pain, unspecified whether sciatica present   Radiculopathy, lumbosacral region   Difficulty in walking, not elsewhere classified  Dizziness and giddiness  Muscle weakness (generalized)  Chronic left shoulder pain  Unsteadiness on feet  Difficulty in walking, not elsewhere classified  Rationale for Evaluation and Treatment Rehabilitation  PERTINENT HISTORY:  Pt reports chronic low back pain with radiation into the right hip and Rt posterior leg. She is a limited historian due to memory recall deficits. Lumbar surgery has been recommended, however insurance is mandating a 6 week trial of PT. Lumbar surgical history includes: PLIF L4-5,  L2-08 Apr 2016, L2/3 & L3/07 Dec 2015, Rt L5-S1 Microdiskectomy/laminectomy 03/01/21  PRECAUTIONS: Fall Risk   SUBJECTIVE:   SUBJECTIVE STATEMENT: Pt reports vomiting 2x yesterday but otherwise felt OK, unsure why. Rates back pain as 3/10 and running into BLEs.  Pt reports no falls, but does report a stumble. She caught her toe on a towel, she reports she did not have to grab anything to get her balance. Pt reports she has been having vertigo for the past week. She has had this in the past and has been treated for it with maneuvers successfully.  PAIN:  Are you having pain? Yes: NPRS scale: 3/10 Pain location: R side low back Pain description: none     TODAY'S TREATMENT:       TherEx:  2.5# AW each LE:  LAQ 2x20 each LE. Rates easy    Seated march 2x20 alt LE. Rates medium, some discomfort felt in low   back with increased reps   Ambulation with Rollator 2x148 ft RPE 6, rest break, 3x148 ft RPE 7. Discontinued with peripheralization of symptoms. SBA  Physioball FWD/BCKWD mild discomfort 10x, cuing to modify range of motion  NMR: Cervical AROM screen - pt exhibits sufficient ROM for positional testing, does report some soreness in neck at end ranges, but no other symptoms.  VBI screen - negative  Roll testing: negative L, no nystagmus on R but pt does report brief vertigo <10 sec with delayed onset DH: negative L, Positive R side with delayed onset of jerk-nystagmus that lasted <10 sec, rotational to R and upbeating. Pt also reports vertigo/spinning. Epley to treat R 1x. Retested following Epley with no nystagmus noted and pt reports no outright vertigo, but feeling briefly like symptoms could come on.   Instructed pt in post-maneuver precautions and red flags to watch out for.    Provided hot pack to low back (unbilled) x 10 min following CRM due to increased low back discomfort. Skin checked WNL prior to and upon removal of hot pack. Pt reports heat improves back pain. Pt also  reports no dizziness at end of session.   PATIENT EDUCATION: Education details: positional testing and purpose, repositioning maneuvers, exercise technique, body mechanics, plan  Person educated: Patient Education method: Explanation, Tactile cues, and Verbal cues Education comprehension: verbalized understanding, returned demonstration, verbal cues required, and tactile cues required    HOME EXERCISE PROGRAM: *below in "Home exercise section"    Plan - 02/26/22 0855     Clinical Impression Statement Pt presents to session with reports of ongoing vertigo over the past week triggered by position and affecting her balance (reports hx of BPPV). Pt found to have positive R DH and was subsequently treated with R Epley 1x. Pt reported no dizziness following maneuver an was educated in post-maneuver precautions. Pt still limited by LBP, but reports some improvement with use of heat at end of appointment. The pt will benefit from further skilled PT to improve mobility, pain and decrease fall risk.    Personal Factors and Comorbidities Comorbidity 3+;Age;Fitness;Past/Current Experience;Time since onset of injury/illness/exacerbation    Comorbidities memory impairment, anxiety, depression, DM, hx of kidney stones, obesity, RA    Examination-Activity Limitations Bed Mobility;Stand;Lift;Locomotion Level;Bend;Transfers;Carry;Continence;Sit;Sleep;Stairs;Squat    Examination-Participation Restrictions Church;Meal Prep;Cleaning;Community Activity;Driving;Interpersonal Relationship;Laundry;Yard Work    [employer] Evolving/Moderate complexity    Rehab Potential Poor    PT Frequency 2x / week    PT Duration 6 weeks    PT Treatment/Interventions Balance training;Neuromuscular re-education;Therapeutic activities;Patient/family education;Manual techniques;Therapeutic exercise;Electrical Stimulation;Cryotherapy;Moist Heat;Stair training;Functional mobility training;DME Instruction;Gait  training;Passive range of motion;Dry needling    PT Next Visit Plan Continue with work toward goal achievement    PT Home Exercise Plan Access Code: [email or web address] URL: [email or web address] Date: 03/06/2022 Prepared by: Rebbeca Paul  Exercises - Supine Hip Adduction Isometric with Ball  - 1 x daily - 5 x weekly - 3 sets - 15 reps - 3 seconds hold - Sit to Stand with Counter Support  - 1 x daily - 5 x weekly - 3 sets - 10 reps - Seated Long Arc Quad  - 1 x daily - 5 x weekly - 3 sets - 10 reps - 3 hold - Side Stepping with Counter Support  - 1 x daily - 5 x weekly - 3 sets - 20 reps   Consulted and Agree with Plan of Care Patient  PT Short Term Goals - TARGET DATE 04/29/2022        PT SHORT TERM GOAL #1   Title Pt will be independent with her initial HEP to decrease pain, improve strength, function, and ability to perform transfers more comfortably and with less difficulty.  03/11/22: limited by ST memory deficits;    Time 4   Period Weeks    Status Ongoing    Target Date       PT SHORT TERM GOAL #2   Title Pt will demonstrate improved tolerance to overground AMB by> 500ft without pain limitation:    Baseline eval: <300ft; 03/11/22: 450ft without pain exacerbation, no AD, then 750ft c 4WW without pain exacerbation.    Time 4   Period Weeks    Status On-going    Target Date       PT SHORT TERM GOAL #3   Title Pt to demonstrate improved tolerance to power actiivties AEB 5xSTS <15 secs    Baseline Eval: 19sec; 03/11/22: 11.45sec    Time 4   Period Weeks    Status Coming along     Target Date 03/18/22     PT SHORT TERM GOAL #3   Title Pt to demonstrate improved tolerance to power actiivties AEB 5xSTS <15 secs    Baseline Eval: 19sec; 03/11/22: 11.45sec    Time 3    Period Weeks    Status Coming along     Target Date         PT SHORT TERM GOAL #4  Title Pt will understand precautions, including post-maneuver precautions, and technique with  canalith repositioning maneuver in order to reduce dizziness and decrease fall risk  Baseline 11/28: pt verbalized understanding for all, successfully completes maneuver in session with PT  Time 4  Period Weeks   Status MET  Target Date         PT Long Term Goals - TARGET DATE 05/13/2022          PT LONG TERM GOAL #1   Title Pt to improve score on FOTO survery by >8 points to indicate improved self efficicacy in ADL/IADL mobility.    Baseline Eval: 40    Time 6    Period Weeks    Status New    Target Date       PT LONG TERM GOAL #2   Title Pt to demonstrate improved standing tolerance at home to >20 minutes.    Baseline eval: 5-10 minutes    Time 6    Period Weeks    Status New    Target Date       PT LONG TERM GOAL #3   Title Pt to demonstrate tolerance to 6MWT without need to sit during testing, performance >1000ft.    Baseline eval: tolerates less than 2 minutes of walking.  03/11/22: c 4WW AMB 4m30s without pain exacerbation (750ft)    Time 6    Period Weeks    Status New    Target Date               2:51 PM, 04/01/22  R  PT    Physical Therapist- [place]  [facility]  04/01/22, 2:51 PM     

## 2022-04-03 ENCOUNTER — Ambulatory Visit: Payer: Medicare HMO

## 2022-04-04 ENCOUNTER — Ambulatory Visit: Payer: Medicare HMO | Attending: Neurological Surgery

## 2022-04-04 DIAGNOSIS — M25512 Pain in left shoulder: Secondary | ICD-10-CM | POA: Diagnosis present

## 2022-04-04 DIAGNOSIS — M25551 Pain in right hip: Secondary | ICD-10-CM | POA: Insufficient documentation

## 2022-04-04 DIAGNOSIS — M6281 Muscle weakness (generalized): Secondary | ICD-10-CM | POA: Insufficient documentation

## 2022-04-04 DIAGNOSIS — R262 Difficulty in walking, not elsewhere classified: Secondary | ICD-10-CM | POA: Insufficient documentation

## 2022-04-04 DIAGNOSIS — M5417 Radiculopathy, lumbosacral region: Secondary | ICD-10-CM | POA: Insufficient documentation

## 2022-04-04 DIAGNOSIS — R2681 Unsteadiness on feet: Secondary | ICD-10-CM | POA: Diagnosis present

## 2022-04-04 DIAGNOSIS — G8929 Other chronic pain: Secondary | ICD-10-CM | POA: Diagnosis present

## 2022-04-04 DIAGNOSIS — M545 Low back pain, unspecified: Secondary | ICD-10-CM | POA: Diagnosis present

## 2022-04-04 DIAGNOSIS — R42 Dizziness and giddiness: Secondary | ICD-10-CM | POA: Insufficient documentation

## 2022-04-04 DIAGNOSIS — M5416 Radiculopathy, lumbar region: Secondary | ICD-10-CM | POA: Diagnosis present

## 2022-04-04 NOTE — Therapy (Signed)
OUTPATIENT PHYSICAL THERAPY TREATMENT NOTE/PHYSICAL THERAPY PROGRESS NOTE   Dates of reporting period  02/24/22   to   04/04/22    Patient Name: Jessica Cole MRN: [id number] DOB:[birth year], 71 y.o., female Today's Date: 04/04/2022  PCP: Adin Hector, MD REFERRING PROVIDER: Eustace Moore, MD       Past Medical History:  Diagnosis Date   Anxiety    Asthma    seasonal with allergies   Colon polyps    Degenerative arthritis    Depression    Diabetes mellitus without complication ([place])    Type II   Environmental allergies    Family history of adverse reaction to anesthesia    sister- PONV   Fatty liver    Glaucoma    Headache(784.0)    HX  MIGRAINES   History of bronchitis    History of kidney stones    History of pneumonia    Hypertension    Hypothyroidism    Obesity    OSA on CPAP    Plantar fasciitis    right   PONV (postoperative nausea and vomiting)    pt has never had any anesthesia complications, no post-op nausea or vomiting   Renal calculi    Renal calculi    Rheumatoid arthritis (HCC)    RLS (restless legs syndrome) 08/23/2014     Past Surgical History:  Procedure Laterality Date   ABDOMINAL HYSTERECTOMY     partial   APPENDECTOMY     Arthroscopic surgery, knee Left    BREAST BIOPSY Right    several   BREAST BIOPSY Right 03/11/2017   u/s bx neg   BUNIONECTOMY     LEFT    COLONOSCOPY W/ POLYPECTOMY     COLONOSCOPY WITH PROPOFOL N/A 08/14/2017   Procedure: COLONOSCOPY WITH PROPOFOL;  Surgeon: Lollie Sails, MD;  Location: [facility] ENDOSCOPY;  Service: Endoscopy;  Laterality: N/A;   EXTRACORPOREAL SHOCK WAVE LITHOTRIPSY Left 05/14/2017   Procedure: EXTRACORPOREAL SHOCK WAVE LITHOTRIPSY (ESWL);  Surgeon: Abbie Sons, MD;  Location: [facility] ORS;  Service: Urology;  Laterality: Left;   EXTRACORPOREAL SHOCK WAVE LITHOTRIPSY Left 06/14/2020   Procedure: EXTRACORPOREAL SHOCK WAVE LITHOTRIPSY (ESWL);  Surgeon: Billey Co, MD;  Location:  [facility] ORS;  Service: Urology;  Laterality: Left;   FOOT SURGERY     RIGHT     KNEE ARTHROSCOPY W/ MENISCAL REPAIR Left    LASIK     LUMBAR LAMINECTOMY/DECOMPRESSION MICRODISCECTOMY Right 03/01/2021   Procedure: Microdiscectomy - Lumbar five-Sacral one - right;  Surgeon: Eustace Moore, MD;  Location: [place];  Service: Neurosurgery;  Laterality: Right;   MAXIMUM ACCESS (MAS)POSTERIOR LUMBAR INTERBODY FUSION (PLIF) 1 LEVEL N/A 04/25/2016   Procedure: LUMBAR FOUR-FIVE  MAXIMUM ACCESS (MAS) POSTERIOR LUMBAR INTERBODY FUSION (PLIF) with extension of instrumentation LUMBAR TWO-FIVE;  Surgeon: Eustace Moore, MD;  Location: [place];  Service: Neurosurgery;  Laterality: N/A;   MAXIMUM ACCESS (MAS)POSTERIOR LUMBAR INTERBODY FUSION (PLIF) 2 LEVEL N/A 12/13/2015   Procedure: Lumbar two-three - Lumbar three-four MAXIMUM ACCESS (MAS) POSTERIOR LUMBAR INTERBODY FUSION (PLIF)  ;  Surgeon: Eustace Moore, MD;  Location: [facility] NEURO ORS;  Service: Neurosurgery;  Laterality: N/A;   REVERSE SHOULDER ARTHROPLASTY Left 03/01/2020   Procedure: REVERSE SHOULDER ARTHROPLASTY;  Surgeon: Corky Mull, MD;  Location: [facility] ORS;  Service: Orthopedics;  Laterality: Left;   SHOULDER SURGERY     RIGHT    TONSILLECTOMY     Patient Active Problem List   Diagnosis  Date Noted   S/P lumbar laminectomy 03/01/2021   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 edema associated with type 2 diabetes mellitus ([place]) 01/03/2019   Chronic cough 11/19/2016   Morbid obesity ([place]) 09/26/2016   Left ovarian cyst 05/30/2016   S/P lumbar spinal fusion 12/13/2015   Aortic calcification ([place]) 07/18/2015   Controlled type 2 diabetes mellitus without complication ([place]) 11/02/1599   Thrombocytopenia ([place]) 05/16/2015   Recurrent major depressive disorder, in full remission ([place]) 05/16/2015   Essential (primary) hypertension 05/16/2015   Fatty infiltration of liver 03/15/2015   Aortic valve stenosis, nonrheumatic 01/18/2015    Sciatica of right side 01/09/2015   Type 2 diabetes mellitus ([place]) 11/10/2014   RLS (restless legs syndrome) 08/23/2014   Neuritis or radiculitis due to rupture of lumbar intervertebral disc 06/13/2014   Degeneration of intervertebral disc of lumbar region 06/13/2014   Lumbar radiculitis 06/13/2014   Arthritis 01/23/2014   Arthritis of knee, degenerative 11/15/2013   Depression 09/29/2013   Insomnia 09/29/2013   Apnea, sleep 08/29/2013   History of nephrolithiasis 08/29/2013   Abnormal presence of protein in urine 08/29/2013   Headache, migraine 08/29/2013   BP (high blood pressure) 08/29/2013   HLD (hyperlipidemia) 08/29/2013   Allergic rhinitis 08/29/2013   Intractable migraine without aura 02/18/2013    REFERRING DIAG: Radiculopathy, Lumbosacral Region  THERAPY DIAG:   Chronic right-sided low back pain, unspecified whether sciatica present   Radiculopathy, lumbosacral region   Difficulty in walking, not elsewhere classified  No diagnosis found.  Rationale for Evaluation and Treatment Rehabilitation  PERTINENT HISTORY:  Pt reports chronic low back pain with radiation into the right hip and Rt posterior leg. She is a limited historian due to memory recall deficits. Lumbar surgery has been recommended, however insurance is mandating a 6 week trial of PT. Lumbar surgical history includes: PLIF L4-5, L2-08 Apr 2016, L2/3 & L3/07 Dec 2015, Rt L5-S1 Microdiskectomy/laminectomy 03/01/21  PRECAUTIONS: Fall Risk   SUBJECTIVE:   SUBJECTIVE STATEMENT:   Pt reports she is feeling better since the last time that she was here.  Pt states that the MD put off surgery for at least the next couple of months due to having success with therapy.   PAIN:  Are you having pain? Yes: NPRS scale: 3/10 Pain location: R side low back Pain description: none     TODAY'S TREATMENT:  TherEx:  Physioball FWD/BCKWD mild discomfort 10x, cuing to modify range of motion  Pt reported that she  was having pain in the R shoulder.  Pt reports she is wanting to see MD for it.  Therapist performed special tests to check for pathology.  Hawkin's-Kennedy: Positive Open Can Test: Positive Empty Can Test: Positive Neer's test: Positive  Pt performed standing isometric shoulder exercises for forward flexion/adduction/abduction/extension into towel on wall, x10 with 3 sec holds each direction Pt given as part of HEP as noted below as well.    PATIENT EDUCATION: Education details: positional testing and purpose, repositioning maneuvers, exercise technique, body mechanics, plan  Person educated: Patient Education method: Explanation, Tactile cues, and Verbal cues Education comprehension: verbalized understanding, returned demonstration, verbal cues required, and tactile cues required    HOME EXERCISE PROGRAM: Access Code: [email or web address] URL: [email or web address] Date: 04/04/2022 Prepared by: [place]  Exercises - Isometric Shoulder Flexion at Wall  - 1 x daily - 7 x weekly - 2 sets - 10 reps - 3 hold - Isometric Shoulder Extension at Wall  - 1 x daily -  7 x weekly - 2 sets - 10 reps - 3 hold - Isometric Shoulder Abduction at Wall  - 1 x daily - 7 x weekly - 2 sets - 10 reps - 3 hold - Isometric Shoulder Adduction  - 1 x daily - 7 x weekly - 2 sets - 10 reps - 3 hold    Plan - 02/26/22 0855     Clinical Impression Statement Pt performed well with the tasks given to her and her goal assessment.  Pt noted pain in the R shoulder, so it was assessed and pt demonstrates signs and symptoms consistent with shoulder pathology.  Pt encouraged to contact her MD for further testing, and was given isometric exercises as part of her HEP to address the shoulder in the mean time.  Otherwise pt is performing well with all tasks and will continue to improve towards goals.  Patient's condition has the potential to improve in response to therapy. Maximum improvement is yet to be obtained.  The anticipated improvement is attainable and reasonable in a generally predictable time.   Pt will continue to benefit from skilled therapy to address remaining deficits in order to improve overall QoL and return to PLOF.       Personal Factors and Comorbidities Comorbidity 3+;Age;Fitness;Past/Current Experience;Time since onset of injury/illness/exacerbation    Comorbidities memory impairment, anxiety, depression, DM, hx of kidney stones, obesity, RA    Examination-Activity Limitations Bed Mobility;Stand;Lift;Locomotion Level;Bend;Transfers;Carry;Continence;Sit;Sleep;Stairs;Squat    Examination-Participation Restrictions Church;Meal Prep;Cleaning;Community Activity;Driving;Interpersonal Relationship;Laundry;Yard Work    Merchant [employer] officer Evolving/Moderate complexity    Rehab Potential Poor    PT Frequency 2x / week    PT Duration 6 weeks    PT Treatment/Interventions Balance training;Neuromuscular re-education;Therapeutic activities;Patient/family education;Manual techniques;Therapeutic exercise;Electrical Stimulation;Cryotherapy;Moist Heat;Stair training;Functional mobility training;DME Instruction;Gait training;Passive range of motion;Dry needling    PT Next Visit Plan Continue with work toward goal achievement    PT Home Exercise Plan Access Code: [email or web address] URL: [email or web address] Date: 03/06/2022 Prepared by: Rebbeca Paul  Exercises - Supine Hip Adduction Isometric with Ball  - 1 x daily - 5 x weekly - 3 sets - 15 reps - 3 seconds hold - Sit to Stand with Counter Support  - 1 x daily - 5 x weekly - 3 sets - 10 reps - Seated Long Arc Quad  - 1 x daily - 5 x weekly - 3 sets - 10 reps - 3 hold - Side Stepping with Counter Support  - 1 x daily - 5 x weekly - 3 sets - 20 reps   Consulted and Agree with Plan of Care Patient                 PT Short Term Goals - TARGET DATE 04/29/2022        PT SHORT TERM GOAL #1   Title Pt will be independent  with her initial HEP to decrease pain, improve strength, function, and ability to perform transfers more comfortably and with less difficulty.  03/11/22: limited by ST memory deficits;    Time 4   Period Weeks    Status Ongoing    Target Date       PT SHORT TERM GOAL #2   Title Pt will demonstrate improved tolerance to overground AMB by> 571f without pain limitation:    Baseline eval: <3081f 03/11/22: 45043fithout pain exacerbation, no AD, then [id number] without pain exacerbation;  04/04/22: 1200 ft without pain   Time 4   Period Weeks    Status  MET   Target Date       PT SHORT TERM GOAL #3   Title Pt to demonstrate improved tolerance to power activities AEB 5xSTS <15 secs    Baseline Eval: 19sec; 03/11/22: 11.45sec; 04/04/22: 11.63 sec   Time 4   Period Weeks    Status MET   Target Date 03/18/22                                     PT SHORT TERM GOAL #4  Title Pt will understand precautions, including post-maneuver precautions, and technique with canalith repositioning maneuver in order to reduce dizziness and decrease fall risk  Baseline 11/28: pt verbalized understanding for all, successfully completes maneuver in session with PT  Time 4  Period Weeks   Status MET  Target Date         PT Long Term Goals - TARGET DATE 05/13/2022          PT LONG TERM GOAL #1   Title Pt to improve score on FOTO survery by >8 points to indicate improved self efficicacy in ADL/IADL mobility.    Baseline Eval: 40; 04/04/22: 52   Time 6    Period Weeks    Status MET    Target Date 05/13/22     PT LONG TERM GOAL #2   Title Pt to demonstrate improved standing tolerance at home to >20 minutes.    Baseline eval: 5-10 minutes; 04/04/22: 30 minutes   Time 6    Period Weeks    Status New    Target Date 05/13/22     PT LONG TERM GOAL #3   Title Pt to demonstrate tolerance to 6MWT without need to sit during testing, performance >1060f.    Baseline eval: tolerates less than 2 minutes of  walking.  03/11/22: c 4WW AMB 419ms without pain exacerbation (75077f 04/04/22: c 4WW AMB 1207 ft without pain   Time 6    Period Weeks    Status MET    Target Date 05/13/21   PT LONG TERM GOAL #4  Title Pt to demonstrate tolerance to 6MWT without AD or need to sit during testing, performance >1000f71f Baseline 04/04/22: Pt able to perform 1207 ft c 4WW   Time 4  Period Weeks   Status NEW   Target Date 05/13/21             JoshGwenlyn Saran, DPT Physical Therapist- [facility]/01/23, 8:47 AM

## 2022-04-07 NOTE — Therapy (Unsigned)
OUTPATIENT PHYSICAL THERAPY TREATMENT NOTE/RECERT   Patient Name: Jessica Cole MRN: [id number] DOB:[birth year], 71 y.o., female Today's Date: 04/07/2022  PCP: Adin Hector, MD REFERRING PROVIDER: Eustace Moore, MD       Past Medical History:  Diagnosis Date   Anxiety    Asthma    seasonal with allergies   Colon polyps    Degenerative arthritis    Depression    Diabetes mellitus without complication ([place])    Type II   Environmental allergies    Family history of adverse reaction to anesthesia    sister- PONV   Fatty liver    Glaucoma    Headache(784.0)    HX  MIGRAINES   History of bronchitis    History of kidney stones    History of pneumonia    Hypertension    Hypothyroidism    Obesity    OSA on CPAP    Plantar fasciitis    right   PONV (postoperative nausea and vomiting)    pt has never had any anesthesia complications, no post-op nausea or vomiting   Renal calculi    Renal calculi    Rheumatoid arthritis (HCC)    RLS (restless legs syndrome) 08/23/2014     Past Surgical History:  Procedure Laterality Date   ABDOMINAL HYSTERECTOMY     partial   APPENDECTOMY     Arthroscopic surgery, knee Left    BREAST BIOPSY Right    several   BREAST BIOPSY Right 03/11/2017   u/s bx neg   BUNIONECTOMY     LEFT    COLONOSCOPY W/ POLYPECTOMY     COLONOSCOPY WITH PROPOFOL N/A 08/14/2017   Procedure: COLONOSCOPY WITH PROPOFOL;  Surgeon: Lollie Sails, MD;  Location: [facility] ENDOSCOPY;  Service: Endoscopy;  Laterality: N/A;   EXTRACORPOREAL SHOCK WAVE LITHOTRIPSY Left 05/14/2017   Procedure: EXTRACORPOREAL SHOCK WAVE LITHOTRIPSY (ESWL);  Surgeon: Abbie Sons, MD;  Location: [facility] ORS;  Service: Urology;  Laterality: Left;   EXTRACORPOREAL SHOCK WAVE LITHOTRIPSY Left 06/14/2020   Procedure: EXTRACORPOREAL SHOCK WAVE LITHOTRIPSY (ESWL);  Surgeon: Billey Co, MD;  Location: [facility] ORS;  Service: Urology;  Laterality: Left;   FOOT SURGERY     RIGHT      KNEE ARTHROSCOPY W/ MENISCAL REPAIR Left    LASIK     LUMBAR LAMINECTOMY/DECOMPRESSION MICRODISCECTOMY Right 03/01/2021   Procedure: Microdiscectomy - Lumbar five-Sacral one - right;  Surgeon: Eustace Moore, MD;  Location: [place];  Service: Neurosurgery;  Laterality: Right;   MAXIMUM ACCESS (MAS)POSTERIOR LUMBAR INTERBODY FUSION (PLIF) 1 LEVEL N/A 04/25/2016   Procedure: LUMBAR FOUR-FIVE  MAXIMUM ACCESS (MAS) POSTERIOR LUMBAR INTERBODY FUSION (PLIF) with extension of instrumentation LUMBAR TWO-FIVE;  Surgeon: Eustace Moore, MD;  Location: [place];  Service: Neurosurgery;  Laterality: N/A;   MAXIMUM ACCESS (MAS)POSTERIOR LUMBAR INTERBODY FUSION (PLIF) 2 LEVEL N/A 12/13/2015   Procedure: Lumbar two-three - Lumbar three-four MAXIMUM ACCESS (MAS) POSTERIOR LUMBAR INTERBODY FUSION (PLIF)  ;  Surgeon: Eustace Moore, MD;  Location: [facility] NEURO ORS;  Service: Neurosurgery;  Laterality: N/A;   REVERSE SHOULDER ARTHROPLASTY Left 03/01/2020   Procedure: REVERSE SHOULDER ARTHROPLASTY;  Surgeon: Corky Mull, MD;  Location: [facility] ORS;  Service: Orthopedics;  Laterality: Left;   SHOULDER SURGERY     RIGHT    TONSILLECTOMY     Patient Active Problem List   Diagnosis Date Noted   S/P lumbar laminectomy 03/01/2021   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 edema associated with type 2 diabetes mellitus ([place]) 01/03/2019   Chronic cough 11/19/2016   Morbid obesity ([place]) 09/26/2016   Left ovarian cyst 05/30/2016   S/P lumbar spinal fusion 12/13/2015   Aortic calcification ([place]) 07/18/2015   Controlled type 2 diabetes mellitus without complication ([place]) 07/24/2246   Thrombocytopenia ([place]) 05/16/2015   Recurrent major depressive disorder, in full remission ([place]) 05/16/2015   Essential (primary) hypertension 05/16/2015   Fatty infiltration of liver 03/15/2015   Aortic valve stenosis, nonrheumatic 01/18/2015   Sciatica of right side 01/09/2015   Type 2 diabetes mellitus ([place])  11/10/2014   RLS (restless legs syndrome) 08/23/2014   Neuritis or radiculitis due to rupture of lumbar intervertebral disc 06/13/2014   Degeneration of intervertebral disc of lumbar region 06/13/2014   Lumbar radiculitis 06/13/2014   Arthritis 01/23/2014   Arthritis of knee, degenerative 11/15/2013   Depression 09/29/2013   Insomnia 09/29/2013   Apnea, sleep 08/29/2013   History of nephrolithiasis 08/29/2013   Abnormal presence of protein in urine 08/29/2013   Headache, migraine 08/29/2013   BP (high blood pressure) 08/29/2013   HLD (hyperlipidemia) 08/29/2013   Allergic rhinitis 08/29/2013   Intractable migraine without aura 02/18/2013    REFERRING DIAG: Radiculopathy, Lumbosacral Region  THERAPY DIAG:   Chronic right-sided low back pain, unspecified whether sciatica present   Radiculopathy, lumbosacral region   Difficulty in walking, not elsewhere classified  No diagnosis found.  Rationale for Evaluation and Treatment Rehabilitation  PERTINENT HISTORY:  Pt reports chronic low back pain with radiation into the right hip and Rt posterior leg. She is a limited historian due to memory recall deficits. Lumbar surgery has been recommended, however insurance is mandating a 6 week trial of PT. Lumbar surgical history includes: PLIF L4-5, L2-08 Apr 2016, L2/3 & L3/07 Dec 2015, Rt L5-S1 Microdiskectomy/laminectomy 03/01/21  PRECAUTIONS: Fall Risk   SUBJECTIVE:   SUBJECTIVE STATEMENT:***  PAIN:  Are you having pain? Yes: NPRS scale: 3/10 Pain location: R side low back Pain description: none     TODAY'S TREATMENT:       TherEx:   STS 3x10. Rates medium, fatiguing.   Seated BP LUE: 134/78 mmHg, HR 82 bpm - monitored today due to increased fatigue since covid    2.5# AW each LE: LAQ 20x 10x each LE. Rates hard  Seated march 2x20 alt LE. Rates medium     Nustep interval training, seat 4, lvls 1, 2, and 3 performed in 1 min intervals for a total of 5 minutes Cuing for  SPM >60 throughout. PT adjusts intensity as appropriate to maintain therapeutic range and monitors pt for response throughout. Pt visibly fatigued.      Physioball FWD/BCKWD mild discomfort 10x, attempted LTL for 5 but discontinued due to pain   PATIENT EDUCATION: Education details: positional testing and purpose, repositioning maneuvers, exercise technique, body mechanics, plan  Person educated: Patient Education method: Explanation, Tactile cues, and Verbal cues Education comprehension: verbalized understanding, returned demonstration, verbal cues required, and tactile cues required    HOME EXERCISE PROGRAM: *below in "Home exercise section"    Plan - 02/26/22 0855     Clinical Impression Statement ***. The pt will benefit from further skilled PT to improve mobility, pain and decrease fall risk.    Personal Factors and Comorbidities Comorbidity 3+;Age;Fitness;Past/Current Experience;Time since onset of injury/illness/exacerbation    Comorbidities memory impairment, anxiety, depression, DM, hx of kidney stones, obesity, RA    Examination-Activity Limitations Bed Mobility;Stand;Lift;Locomotion Level;Bend;Transfers;Carry;Continence;Sit;Sleep;Stairs;Squat    Examination-Participation  Restrictions Church;Meal Prep;Cleaning;Community Activity;Driving;Interpersonal Relationship;Laundry;Yard Work    Merchant [employer] officer Evolving/Moderate complexity    Rehab Potential Poor    PT Frequency 2x / week    PT Duration 6 weeks    PT Treatment/Interventions Balance training;Neuromuscular re-education;Therapeutic activities;Patient/family education;Manual techniques;Therapeutic exercise;Electrical Stimulation;Cryotherapy;Moist Heat;Stair training;Functional mobility training;DME Instruction;Gait training;Passive range of motion;Dry needling    PT Next Visit Plan Continue with work toward goal achievement    PT Home Exercise Plan Access Code: [email or web address] URL:  [email or web address] Date: 03/06/2022 Prepared by: Rebbeca Paul  Exercises - Supine Hip Adduction Isometric with Ball  - 1 x daily - 5 x weekly - 3 sets - 15 reps - 3 seconds hold - Sit to Stand with Counter Support  - 1 x daily - 5 x weekly - 3 sets - 10 reps - Seated Long Arc Quad  - 1 x daily - 5 x weekly - 3 sets - 10 reps - 3 hold - Side Stepping with Counter Support  - 1 x daily - 5 x weekly - 3 sets - 20 reps   Consulted and Agree with Plan of Care Patient                 PT Short Term Goals - TARGET DATE 04/29/2022        PT SHORT TERM GOAL #1   Title Pt will be independent with her initial HEP to decrease pain, improve strength, function, and ability to perform transfers more comfortably and with less difficulty.  03/11/22: limited by ST memory deficits;    Time 4   Period Weeks    Status Ongoing    Target Date       PT SHORT TERM GOAL #2   Title Pt will demonstrate improved tolerance to overground AMB by> 567f without pain limitation:    Baseline eval: <3067f 03/11/22: 45038fithout pain exacerbation, no AD, then [id number] without pain exacerbation.    Time 4   Period Weeks    Status On-going    Target Date       PT SHORT TERM GOAL #3   Title Pt to demonstrate improved tolerance to power actiivties AEB 5xSTS <15 secs    Baseline Eval: 19sec; 03/11/22: 11.45sec    Time 4   Period Weeks    Status Coming along     Target Date 03/18/22     PT SHORT TERM GOAL #3   Title Pt to demonstrate improved tolerance to power actiivties AEB 5xSTS <15 secs    Baseline Eval: 19sec; 03/11/22: 11.45sec    Time 3    Period Weeks    Status Coming along     Target Date         PT SHORT TERM GOAL #4  Title Pt will understand precautions, including post-maneuver precautions, and technique with canalith repositioning maneuver in order to reduce dizziness and decrease fall risk  Baseline 11/28: pt verbalized understanding for all, successfully completes  maneuver in session with PT  Time 4  Period Weeks   Status MET  Target Date         PT Long Term Goals - TARGET DATE 05/13/2022          PT LONG TERM GOAL #1   Title Pt to improve score on FOTO survery by >8 points to indicate improved self efficicacy in ADL/IADL mobility.    Baseline Eval: 40    Time 6    Period Weeks    Status New  Target Date       PT LONG TERM GOAL #2   Title Pt to demonstrate improved standing tolerance at home to >20 minutes.    Baseline eval: 5-10 minutes    Time 6    Period Weeks    Status New    Target Date       PT LONG TERM GOAL #3   Title Pt to demonstrate tolerance to 6MWT without need to sit during testing, performance >1059f.    Baseline eval: tolerates less than 2 minutes of walking.  03/11/22: c 4WW AMB 445ms without pain exacerbation (75030f   Time 6    Period Weeks    Status New    Target Date               3:10 PM, 04/07/22 MarJanna Arch

## 2022-04-08 ENCOUNTER — Ambulatory Visit: Payer: Medicare HMO

## 2022-04-08 DIAGNOSIS — G8929 Other chronic pain: Secondary | ICD-10-CM

## 2022-04-08 DIAGNOSIS — R2681 Unsteadiness on feet: Secondary | ICD-10-CM

## 2022-04-08 DIAGNOSIS — R262 Difficulty in walking, not elsewhere classified: Secondary | ICD-10-CM

## 2022-04-08 DIAGNOSIS — M6281 Muscle weakness (generalized): Secondary | ICD-10-CM

## 2022-04-08 DIAGNOSIS — M545 Low back pain, unspecified: Secondary | ICD-10-CM

## 2022-04-08 DIAGNOSIS — M5417 Radiculopathy, lumbosacral region: Secondary | ICD-10-CM

## 2022-04-08 DIAGNOSIS — R42 Dizziness and giddiness: Secondary | ICD-10-CM

## 2022-04-08 DIAGNOSIS — M25551 Pain in right hip: Secondary | ICD-10-CM

## 2022-04-08 DIAGNOSIS — M5416 Radiculopathy, lumbar region: Secondary | ICD-10-CM

## 2022-04-08 NOTE — Therapy (Signed)
OUTPATIENT PHYSICAL THERAPY TREATMENT NOTE    Patient Name: Jessica Cole MRN: [id number] DOB:Nov 27, 1950, 71 y.o., female Today's Date: 04/08/2022  PCP: Adin Hector, MD REFERRING PROVIDER: Eustace Moore, MD   PT End of Session - 04/08/22 1352     Visit Number 11    Number of Visits 12    Date for PT Re-Evaluation 05/13/22    Authorization Type [employer]    Authorization Time Period 02/24/22-04/07/22    Progress Note Due on Visit 10    PT Start Time 1348    PT Stop Time 1430    PT Time Calculation (min) 42 min    Equipment Utilized During Treatment Gait belt    Activity Tolerance Patient tolerated treatment well;Patient limited by pain    Behavior During Therapy WFL for tasks assessed/performed             Past Medical History:  Diagnosis Date   Anxiety    Asthma    seasonal with allergies   Colon polyps    Degenerative arthritis    Depression    Diabetes mellitus without complication ([place])    Type II   Environmental allergies    Family history of adverse reaction to anesthesia    sister- PONV   Fatty liver    Glaucoma    Headache(784.0)    HX  MIGRAINES   History of bronchitis    History of kidney stones    History of pneumonia    Hypertension    Hypothyroidism    Obesity    OSA on CPAP    Plantar fasciitis    right   PONV (postoperative nausea and vomiting)    pt has never had any anesthesia complications, no post-op nausea or vomiting   Renal calculi    Renal calculi    Rheumatoid arthritis (HCC)    RLS (restless legs syndrome) 08/23/2014     Past Surgical History:  Procedure Laterality Date   ABDOMINAL HYSTERECTOMY     partial   APPENDECTOMY     Arthroscopic surgery, knee Left    BREAST BIOPSY Right    several   BREAST BIOPSY Right 03/11/2017   u/s bx neg   BUNIONECTOMY     LEFT    COLONOSCOPY W/ POLYPECTOMY     COLONOSCOPY WITH PROPOFOL N/A 08/14/2017   Procedure: COLONOSCOPY WITH PROPOFOL;  Surgeon: Lollie Sails,  MD;  Location: [facility] ENDOSCOPY;  Service: Endoscopy;  Laterality: N/A;   EXTRACORPOREAL SHOCK WAVE LITHOTRIPSY Left 05/14/2017   Procedure: EXTRACORPOREAL SHOCK WAVE LITHOTRIPSY (ESWL);  Surgeon: Abbie Sons, MD;  Location: [facility] ORS;  Service: Urology;  Laterality: Left;   EXTRACORPOREAL SHOCK WAVE LITHOTRIPSY Left 06/14/2020   Procedure: EXTRACORPOREAL SHOCK WAVE LITHOTRIPSY (ESWL);  Surgeon: Billey Co, MD;  Location: [facility] ORS;  Service: Urology;  Laterality: Left;   FOOT SURGERY     RIGHT     KNEE ARTHROSCOPY W/ MENISCAL REPAIR Left    LASIK     LUMBAR LAMINECTOMY/DECOMPRESSION MICRODISCECTOMY Right 03/01/2021   Procedure: Microdiscectomy - Lumbar five-Sacral one - right;  Surgeon: Eustace Moore, MD;  Location: [place];  Service: Neurosurgery;  Laterality: Right;   MAXIMUM ACCESS (MAS)POSTERIOR LUMBAR INTERBODY FUSION (PLIF) 1 LEVEL N/A 04/25/2016   Procedure: LUMBAR FOUR-FIVE  MAXIMUM ACCESS (MAS) POSTERIOR LUMBAR INTERBODY FUSION (PLIF) with extension of instrumentation LUMBAR TWO-FIVE;  Surgeon: Eustace Moore, MD;  Location: [place];  Service: Neurosurgery;  Laterality: N/A;   MAXIMUM ACCESS (  MAS)POSTERIOR LUMBAR INTERBODY FUSION (PLIF) 2 LEVEL N/A 12/13/2015   Procedure: Lumbar two-three - Lumbar three-four MAXIMUM ACCESS (MAS) POSTERIOR LUMBAR INTERBODY FUSION (PLIF)  ;  Surgeon: Eustace Moore, MD;  Location: [facility] NEURO ORS;  Service: Neurosurgery;  Laterality: N/A;   REVERSE SHOULDER ARTHROPLASTY Left 03/01/2020   Procedure: REVERSE SHOULDER ARTHROPLASTY;  Surgeon: Corky Mull, MD;  Location: [facility] ORS;  Service: Orthopedics;  Laterality: Left;   SHOULDER SURGERY     RIGHT    TONSILLECTOMY     Patient Active Problem List   Diagnosis Date Noted   S/P lumbar laminectomy 03/01/2021   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 edema associated with type 2 diabetes mellitus ([place]) 01/03/2019   Chronic cough 11/19/2016   Morbid obesity ([place]) 09/26/2016    Left ovarian cyst 05/30/2016   S/P lumbar spinal fusion 12/13/2015   Aortic calcification ([place]) 07/18/2015   Controlled type 2 diabetes mellitus without complication ([place]) 62/95/2841   Thrombocytopenia ([place]) 05/16/2015   Recurrent major depressive disorder, in full remission ([place]) 05/16/2015   Essential (primary) hypertension 05/16/2015   Fatty infiltration of liver 03/15/2015   Aortic valve stenosis, nonrheumatic 01/18/2015   Sciatica of right side 01/09/2015   Type 2 diabetes mellitus ([place]) 11/10/2014   RLS (restless legs syndrome) 08/23/2014   Neuritis or radiculitis due to rupture of lumbar intervertebral disc 06/13/2014   Degeneration of intervertebral disc of lumbar region 06/13/2014   Lumbar radiculitis 06/13/2014   Arthritis 01/23/2014   Arthritis of knee, degenerative 11/15/2013   Depression 09/29/2013   Insomnia 09/29/2013   Apnea, sleep 08/29/2013   History of nephrolithiasis 08/29/2013   Abnormal presence of protein in urine 08/29/2013   Headache, migraine 08/29/2013   BP (high blood pressure) 08/29/2013   HLD (hyperlipidemia) 08/29/2013   Allergic rhinitis 08/29/2013   Intractable migraine without aura 02/18/2013    REFERRING DIAG: Radiculopathy, Lumbosacral Region  THERAPY DIAG:   Chronic right-sided low back pain, unspecified whether sciatica present   Radiculopathy, lumbosacral region   Difficulty in walking, not elsewhere classified  Dizziness and giddiness  Muscle weakness (generalized)  Unsteadiness on feet  Difficulty in walking, not elsewhere classified  Chronic bilateral low back pain, unspecified whether sciatica present  Chronic right-sided low back pain, unspecified whether sciatica present  Radiculopathy, lumbosacral region  Radiculopathy, lumbar region  Low back pain, unspecified back pain laterality, unspecified chronicity, unspecified whether sciatica present  Pain in right hip  Rationale for Evaluation and Treatment  Rehabilitation  PERTINENT HISTORY:  Pt reports chronic low back pain with radiation into the right hip and Rt posterior leg. She is a limited historian due to memory recall deficits. Lumbar surgery has been recommended, however insurance is mandating a 6 week trial of PT. Lumbar surgical history includes: PLIF L4-5, L2-08 Apr 2016, L2/3 & L3/07 Dec 2015, Rt L5-S1 Microdiskectomy/laminectomy 03/01/21  PRECAUTIONS: Fall Risk   SUBJECTIVE:   SUBJECTIVE STATEMENT:   Pt reports her shoulder is not doing as bad as last visit.  She reports that her low back just started hurting about 30-45 minutes ago.  Pt reports it was bothering her some last night, but not as bad as it has been.   PAIN: Are you having pain? Yes: NPRS scale: 4/10 Pain location: R side low back Pain description: none     TODAY'S TREATMENT:  TherEx:  Physioball FWD/BCKWD mild discomfort 10x, cuing to modify range of motion Standing lumbar extension at wall use of forearms, x15 reps  Seated anterior/posterior pelvic tilts, x10 each direction, pt noting some increased pain with movement  Moist heat applied to lumbar spine for pain modulation: Supine LTR, 2x15 each side, however pt notes that significant pain on the R side with rotation to the L Supine bridges to pain tolerant range, 2x10 Supine posterior pelvic tilt into MHP, 2x10 with 3 sec holds Supine TrA activation with marches, 2x10 each LE Supine SLR, 2x10 each LE   PATIENT EDUCATION: Education details: positional testing and purpose, repositioning maneuvers, exercise technique, body mechanics, plan  Person educated: Patient Education method: Explanation, Tactile cues, and Verbal cues Education comprehension: verbalized understanding, returned demonstration, verbal cues required, and tactile cues required    HOME EXERCISE PROGRAM: Access Code: [email or web address] URL: [email or web address] Date: 04/04/2022 Prepared by: [place]  Exercises -  Isometric Shoulder Flexion at Wall  - 1 x daily - 7 x weekly - 2 sets - 10 reps - 3 hold - Isometric Shoulder Extension at Wall  - 1 x daily - 7 x weekly - 2 sets - 10 reps - 3 hold - Isometric Shoulder Abduction at Wall  - 1 x daily - 7 x weekly - 2 sets - 10 reps - 3 hold - Isometric Shoulder Adduction  - 1 x daily - 7 x weekly - 2 sets - 10 reps - 3 hold    Plan - 02/26/22 0855     Clinical Impression Statement Pt responded well to the exercises given.  She put forth good effort but was limited by pain at times.  Pt notes that she is having increased pain in the hip and back with certain movements.  Pt advised and educated on performing exercises within pain free ranges and was able to tolerate the remaining exercises.   Pt will continue to benefit from skilled therapy to address remaining deficits in order to improve overall QoL and return to PLOF.         Personal Factors and Comorbidities Comorbidity 3+;Age;Fitness;Past/Current Experience;Time since onset of injury/illness/exacerbation    Comorbidities memory impairment, anxiety, depression, DM, hx of kidney stones, obesity, RA    Examination-Activity Limitations Bed Mobility;Stand;Lift;Locomotion Level;Bend;Transfers;Carry;Continence;Sit;Sleep;Stairs;Squat    Examination-Participation Restrictions Church;Meal Prep;Cleaning;Community Activity;Driving;Interpersonal Relationship;Laundry;Yard Work    [employer] Evolving/Moderate complexity    Rehab Potential Poor    PT Frequency 2x / week    PT Duration 6 weeks    PT Treatment/Interventions Balance training;Neuromuscular re-education;Therapeutic activities;Patient/family education;Manual techniques;Therapeutic exercise;Electrical Stimulation;Cryotherapy;Moist Heat;Stair training;Functional mobility training;DME Instruction;Gait training;Passive range of motion;Dry needling    PT Next Visit Plan Continue with work toward goal achievement    PT Home Exercise Plan  Access Code: [email or web address] URL: [email or web address] Date: 03/06/2022 Prepared by: Rebbeca Paul  Exercises - Supine Hip Adduction Isometric with Ball  - 1 x daily - 5 x weekly - 3 sets - 15 reps - 3 seconds hold - Sit to Stand with Counter Support  - 1 x daily - 5 x weekly - 3 sets - 10 reps - Seated Long Arc Quad  - 1 x daily - 5 x weekly - 3 sets - 10 reps - 3 hold - Side Stepping with Counter Support  - 1 x daily - 5 x weekly - 3 sets - 20 reps   Consulted and Agree with Plan of Care Patient                 PT Short Term Goals - TARGET DATE 04/29/2022        PT SHORT TERM  GOAL #1   Title Pt will be independent with her initial HEP to decrease pain, improve strength, function, and ability to perform transfers more comfortably and with less difficulty.  03/11/22: limited by ST memory deficits;    Time 4   Period Weeks    Status Ongoing    Target Date       PT SHORT TERM GOAL #2   Title Pt will demonstrate improved tolerance to overground AMB by> 526f without pain limitation:    Baseline eval: <3031f 03/11/22: 45050fithout pain exacerbation, no AD, then [id number] without pain exacerbation;  04/04/22: 1200 ft without pain   Time 4   Period Weeks    Status MET   Target Date       PT SHORT TERM GOAL #3   Title Pt to demonstrate improved tolerance to power activities AEB 5xSTS <15 secs    Baseline Eval: 19sec; 03/11/22: 11.45sec; 04/04/22: 11.63 sec   Time 4   Period Weeks    Status MET   Target Date 03/18/22                                     PT SHORT TERM GOAL #4  Title Pt will understand precautions, including post-maneuver precautions, and technique with canalith repositioning maneuver in order to reduce dizziness and decrease fall risk  Baseline 11/28: pt verbalized understanding for all, successfully completes maneuver in session with PT  Time 4  Period Weeks   Status MET  Target Date         PT Long Term Goals - TARGET DATE  05/13/2022          PT LONG TERM GOAL #1   Title Pt to improve score on FOTO survery by >8 points to indicate improved self efficicacy in ADL/IADL mobility.    Baseline Eval: 40; 04/04/22: 52   Time 6    Period Weeks    Status MET    Target Date 05/13/22     PT LONG TERM GOAL #2   Title Pt to demonstrate improved standing tolerance at home to >20 minutes.    Baseline eval: 5-10 minutes; 04/04/22: 30 minutes   Time 6    Period Weeks    Status New    Target Date 05/13/22     PT LONG TERM GOAL #3   Title Pt to demonstrate tolerance to 6MWT without need to sit during testing, performance >1000ft27f Baseline eval: tolerates less than 2 minutes of walking.  03/11/22: c 4WW AMB [id number] pain exacerbation (750ft) 69f1/23: c 4WW AMB 1207 ft without pain   Time 6    Period Weeks    Status MET    Target Date 05/13/21   PT LONG TERM GOAL #4  Title Pt to demonstrate tolerance to 6MWT without AD or need to sit during testing, performance >1000ft.  53feline 04/04/22: Pt able to perform 1207 ft c 4WW   Time 4  Period Weeks   Status NEW   Target Date 05/13/21             Kazuto Sevey RGwenlyn SaranT Physical Therapist- [facility]23, 5:17 PM

## 2022-04-10 ENCOUNTER — Ambulatory Visit: Payer: Medicare HMO

## 2022-04-10 DIAGNOSIS — R42 Dizziness and giddiness: Secondary | ICD-10-CM | POA: Diagnosis not present

## 2022-04-10 DIAGNOSIS — M545 Low back pain, unspecified: Secondary | ICD-10-CM

## 2022-04-10 DIAGNOSIS — M6281 Muscle weakness (generalized): Secondary | ICD-10-CM

## 2022-04-10 NOTE — Therapy (Signed)
OUTPATIENT PHYSICAL THERAPY TREATMENT NOTE    Patient Name: Jessica Cole MRN: [id number] DOB:[birth year], 71 y.o., female Today's Date: 04/10/2022  PCP: Adin Hector, MD REFERRING PROVIDER: Eustace Moore, MD   PT End of Session - 04/10/22 1522     Visit Number 12    Number of Visits 21   *corrected to reflect recent recertification   Date for PT Re-Evaluation 05/13/22    Authorization Type [employer]    Authorization Time Period 02/24/22-04/07/22    Progress Note Due on Visit 10    PT Start Time 1148    PT Stop Time 1230    PT Time Calculation (min) 42 min    Equipment Utilized During Treatment Gait belt    Activity Tolerance Patient tolerated treatment well;Patient limited by pain    Behavior During Therapy WFL for tasks assessed/performed             Past Medical History:  Diagnosis Date   Anxiety    Asthma    seasonal with allergies   Colon polyps    Degenerative arthritis    Depression    Diabetes mellitus without complication ([place])    Type II   Environmental allergies    Family history of adverse reaction to anesthesia    sister- PONV   Fatty liver    Glaucoma    Headache(784.0)    HX  MIGRAINES   History of bronchitis    History of kidney stones    History of pneumonia    Hypertension    Hypothyroidism    Obesity    OSA on CPAP    Plantar fasciitis    right   PONV (postoperative nausea and vomiting)    pt has never had any anesthesia complications, no post-op nausea or vomiting   Renal calculi    Renal calculi    Rheumatoid arthritis (HCC)    RLS (restless legs syndrome) 08/23/2014     Past Surgical History:  Procedure Laterality Date   ABDOMINAL HYSTERECTOMY     partial   APPENDECTOMY     Arthroscopic surgery, knee Left    BREAST BIOPSY Right    several   BREAST BIOPSY Right 03/11/2017   u/s bx neg   BUNIONECTOMY     LEFT    COLONOSCOPY W/ POLYPECTOMY     COLONOSCOPY WITH PROPOFOL N/A 08/14/2017   Procedure: COLONOSCOPY  WITH PROPOFOL;  Surgeon: Lollie Sails, MD;  Location: [facility];  Service: Endoscopy;  Laterality: N/A;   EXTRACORPOREAL SHOCK WAVE LITHOTRIPSY Left 05/14/2017   Procedure: EXTRACORPOREAL SHOCK WAVE LITHOTRIPSY (ESWL);  Surgeon: Abbie Sons, MD;  Location: [facility] ORS;  Service: Urology;  Laterality: Left;   EXTRACORPOREAL SHOCK WAVE LITHOTRIPSY Left 06/14/2020   Procedure: EXTRACORPOREAL SHOCK WAVE LITHOTRIPSY (ESWL);  Surgeon: Billey Co, MD;  Location: [facility] ORS;  Service: Urology;  Laterality: Left;   FOOT SURGERY     RIGHT     KNEE ARTHROSCOPY W/ MENISCAL REPAIR Left    LASIK     LUMBAR LAMINECTOMY/DECOMPRESSION MICRODISCECTOMY Right 03/01/2021   Procedure: Microdiscectomy - Lumbar five-Sacral one - right;  Surgeon: Eustace Moore, MD;  Location: [place];  Service: Neurosurgery;  Laterality: Right;   MAXIMUM ACCESS (MAS)POSTERIOR LUMBAR INTERBODY FUSION (PLIF) 1 LEVEL N/A 04/25/2016   Procedure: LUMBAR FOUR-FIVE  MAXIMUM ACCESS (MAS) POSTERIOR LUMBAR INTERBODY FUSION (PLIF) with extension of instrumentation LUMBAR TWO-FIVE;  Surgeon: Eustace Moore, MD;  Location: [place];  Service: Neurosurgery;  Laterality: N/A;   MAXIMUM ACCESS (MAS)POSTERIOR LUMBAR INTERBODY FUSION (PLIF) 2 LEVEL N/A 12/13/2015   Procedure: Lumbar two-three - Lumbar three-four MAXIMUM ACCESS (MAS) POSTERIOR LUMBAR INTERBODY FUSION (PLIF)  ;  Surgeon: Eustace Moore, MD;  Location: [facility] NEURO ORS;  Service: Neurosurgery;  Laterality: N/A;   REVERSE SHOULDER ARTHROPLASTY Left 03/01/2020   Procedure: REVERSE SHOULDER ARTHROPLASTY;  Surgeon: Corky Mull, MD;  Location: [facility] ORS;  Service: Orthopedics;  Laterality: Left;   SHOULDER SURGERY     RIGHT    TONSILLECTOMY     Patient Active Problem List   Diagnosis Date Noted   S/P lumbar laminectomy 03/01/2021   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 edema associated with type 2 diabetes mellitus ([place]) 01/03/2019   Chronic cough  11/19/2016   Morbid obesity ([place]) 09/26/2016   Left ovarian cyst 05/30/2016   S/P lumbar spinal fusion 12/13/2015   Aortic calcification ([place]) 07/18/2015   Controlled type 2 diabetes mellitus without complication ([place]) 78/29/5621   Thrombocytopenia ([place]) 05/16/2015   Recurrent major depressive disorder, in full remission ([place]) 05/16/2015   Essential (primary) hypertension 05/16/2015   Fatty infiltration of liver 03/15/2015   Aortic valve stenosis, nonrheumatic 01/18/2015   Sciatica of right side 01/09/2015   Type 2 diabetes mellitus ([place]) 11/10/2014   RLS (restless legs syndrome) 08/23/2014   Neuritis or radiculitis due to rupture of lumbar intervertebral disc 06/13/2014   Degeneration of intervertebral disc of lumbar region 06/13/2014   Lumbar radiculitis 06/13/2014   Arthritis 01/23/2014   Arthritis of knee, degenerative 11/15/2013   Depression 09/29/2013   Insomnia 09/29/2013   Apnea, sleep 08/29/2013   History of nephrolithiasis 08/29/2013   Abnormal presence of protein in urine 08/29/2013   Headache, migraine 08/29/2013   BP (high blood pressure) 08/29/2013   HLD (hyperlipidemia) 08/29/2013   Allergic rhinitis 08/29/2013   Intractable migraine without aura 02/18/2013    REFERRING DIAG: Radiculopathy, Lumbosacral Region  THERAPY DIAG:   Chronic right-sided low back pain, unspecified whether sciatica present   Radiculopathy, lumbosacral region   Difficulty in walking, not elsewhere classified  Chronic bilateral low back pain, unspecified whether sciatica present  Dizziness and giddiness  Muscle weakness (generalized)  Rationale for Evaluation and Treatment Rehabilitation  PERTINENT HISTORY:  Pt reports chronic low back pain with radiation into the right hip and Rt posterior leg. She is a limited historian due to memory recall deficits. Lumbar surgery has been recommended, however insurance is mandating a 6 week trial of PT. Lumbar surgical history includes: PLIF  L4-5, L2-08 Apr 2016, L2/3 & L3/07 Dec 2015, Rt L5-S1 Microdiskectomy/laminectomy 03/01/21  PRECAUTIONS: Fall Risk   SUBJECTIVE:   SUBJECTIVE STATEMENT:   Pt reports still having some dizziness off and on. She said it has improved but still occasionally experiences some spinning. She reports continued back pain and that she and her doctor have decided to hold off on surgery for a little longer.   PAIN: Are you having pain? Yes: NPRS scale: 4/10 Pain location: R side low back Pain description: none     TODAY'S TREATMENT:  TherEx: Heat donned to low back for the following intervention (pt reports nothing currently applied to low back to prevent use of heat, reports improvement in discomfort with heat)  Nustep interval training - cuing for SPM >60, assist with set-up. PT adjusts intensity throughout and monitors pt for exercise response.  Lvl 1 x 1 min 30 sec Lvl 3 x  1 min Lvl 4  x 1 min Lvl 3 x 1 min Lvl 1 x 1 min   Physioball FWD/BCKWD/LTL  15x, cuing to modify range of motion to increase comfort with intervention  NMR: Dix-hallpike- Pt exhibits non-fatiguing rotational R down-beating jerk nystagmus (slow) with both testing of L and R sides. Pt reports perceived dizziness/vertigo lasts for several minutes and only resolves when pt brought back into seated position. This indicates that remaining nystagmus observed is possibly central in origin. Pt also reports hx of migraines. Will continue to monitor. She does report overall her vertigo has improved since previous appointment with author. Will reassess next visit and provide treatment as indicated. Pt might benefit from follow-up with neurologist.   Oculomotor screen- Smooth pursuit: saccades noted, reports some difficulty focusing on target Saccades: WNL Pt provides education on post-testing precautions , instructed to not try anything challenging to balance today.  PATIENT EDUCATION: Education details: positional testing and  purpose, repositioning maneuvers, exercise technique, body mechanics, plan  Person educated: Patient Education method: Explanation, Tactile cues, and Verbal cues Education comprehension: verbalized understanding, returned demonstration, verbal cues required, and tactile cues required    HOME EXERCISE PROGRAM: Access Code: [email or web address] URL: [email or web address] Date: 04/04/2022 Prepared by: [employer] [place]  Exercises - Isometric Shoulder Flexion at Wall  - 1 x daily - 7 x weekly - 2 sets - 10 reps - 3 hold - Isometric Shoulder Extension at Wall  - 1 x daily - 7 x weekly - 2 sets - 10 reps - 3 hold - Isometric Shoulder Abduction at Wall  - 1 x daily - 7 x weekly - 2 sets - 10 reps - 3 hold - Isometric Shoulder Adduction  - 1 x daily - 7 x weekly - 2 sets - 10 reps - 3 hold    Plan - 02/26/22 0855     Clinical Impression Statement Author followed-up with pt regarding vertigo. She reported overall this has improved but still experiencing spinning occ. Dix-hallpike reassessment completed and pt exhibited non-fatiguing rotational R down-beating jerk nystagmus (slow) with both testing of L and R sides. Will continue to monitor and reassess, but this could be central in origin, and PT suggested to pt she may benefit from follow-up with neurologist should vertigo not resolve. The pt will continue to benefit from skilled therapy to address remaining deficits in order to improve overall QoL and return to PLOF.         Personal Factors and Comorbidities Comorbidity 3+;Age;Fitness;Past/Current Experience;Time since onset of injury/illness/exacerbation    Comorbidities memory impairment, anxiety, depression, DM, hx of kidney stones, obesity, RA    Examination-Activity Limitations Bed Mobility;Stand;Lift;Locomotion Level;Bend;Transfers;Carry;Continence;Sit;Sleep;Stairs;Squat    Examination-Participation Restrictions Church;Meal Prep;Cleaning;Community Activity;Driving;Interpersonal  Relationship;Laundry;Yard Work    [employer] Evolving/Moderate complexity    Rehab Potential Poor    PT Frequency 2x / week    PT Duration 6 weeks    PT Treatment/Interventions Balance training;Neuromuscular re-education;Therapeutic activities;Patient/family education;Manual techniques;Therapeutic exercise;Electrical Stimulation;Cryotherapy;Moist Heat;Stair training;Functional mobility training;DME Instruction;Gait training;Passive range of motion;Dry needling    PT Next Visit Plan Continue with work toward goal achievement    PT Home Exercise Plan Access Code: [email or web address] URL: [email or web address] Date: 03/06/2022 Prepared by: Rebbeca Paul  Exercises - Supine Hip Adduction Isometric with Ball  - 1 x daily - 5 x weekly - 3 sets - 15 reps - 3 seconds hold - Sit to Stand with Counter Support  - 1 x daily - 5 x weekly - 3 sets - 10 reps - Seated Long Arc Quad  -  1 x daily - 5 x weekly - 3 sets - 10 reps - 3 hold - Side Stepping with Counter Support  - 1 x daily - 5 x weekly - 3 sets - 20 reps   Consulted and Agree with Plan of Care Patient                 PT Short Term Goals - TARGET DATE 04/29/2022        PT SHORT TERM GOAL #1   Title Pt will be independent with her initial HEP to decrease pain, improve strength, function, and ability to perform transfers more comfortably and with less difficulty.  03/11/22: limited by ST memory deficits;    Time 4   Period Weeks    Status Ongoing    Target Date       PT SHORT TERM GOAL #2   Title Pt will demonstrate improved tolerance to overground AMB by> 540f without pain limitation:    Baseline eval: <3014f 03/11/22: 45019fithout pain exacerbation, no AD, then [id number] without pain exacerbation;  04/04/22: 1200 ft without pain   Time 4   Period Weeks    Status MET   Target Date       PT SHORT TERM GOAL #3   Title Pt to demonstrate improved tolerance to power activities AEB 5xSTS <15 secs     Baseline Eval: 19sec; 03/11/22: 11.45sec; 04/04/22: 11.63 sec   Time 4   Period Weeks    Status MET   Target Date 03/18/22                                     PT SHORT TERM GOAL #4  Title Pt will understand precautions, including post-maneuver precautions, and technique with canalith repositioning maneuver in order to reduce dizziness and decrease fall risk  Baseline 11/28: pt verbalized understanding for all, successfully completes maneuver in session with PT  Time 4  Period Weeks   Status MET  Target Date         PT Long Term Goals - TARGET DATE 05/13/2022          PT LONG TERM GOAL #1   Title Pt to improve score on FOTO survery by >8 points to indicate improved self efficicacy in ADL/IADL mobility.    Baseline Eval: 40; 04/04/22: 52   Time 6    Period Weeks    Status MET    Target Date 05/13/22     PT LONG TERM GOAL #2   Title Pt to demonstrate improved standing tolerance at home to >20 minutes.    Baseline eval: 5-10 minutes; 04/04/22: 30 minutes   Time 6    Period Weeks    Status New    Target Date 05/13/22     PT LONG TERM GOAL #3   Title Pt to demonstrate tolerance to 6MWT without need to sit during testing, performance >1000ft64f Baseline eval: tolerates less than 2 minutes of walking.  03/11/22: c 4WW AMB [id number] pain exacerbation (750ft) 70f1/23: c 4WW AMB 1207 ft without pain   Time 6    Period Weeks    Status MET    Target Date 05/13/21   PT LONG TERM GOAL #4  Title Pt to demonstrate tolerance to 6MWT without AD or need to sit during testing, performance >1000ft.  49feline 04/04/22: Pt able to perform 1207 ft  c 4WW   Time 4  Period Weeks   Status NEW   Target Date 05/13/21             Ricard Dillon PT, DPT  Physical Therapist- [facility]  04/10/22, 3:39 PM

## 2022-04-14 ENCOUNTER — Ambulatory Visit: Payer: Medicare HMO

## 2022-04-14 NOTE — Therapy (Incomplete)
OUTPATIENT PHYSICAL THERAPY TREATMENT NOTE    Patient Name: Jessica Cole MRN: [id number] DOB:1950-05-09, 71 y.o., female Today's Date: 04/14/2022  PCP: Adin Hector, MD REFERRING PROVIDER: Eustace Moore, MD     Past Medical History:  Diagnosis Date   Anxiety    Asthma    seasonal with allergies   Colon polyps    Degenerative arthritis    Depression    Diabetes mellitus without complication ([place])    Type II   Environmental allergies    Family history of adverse reaction to anesthesia    sister- PONV   Fatty liver    Glaucoma    Headache(784.0)    HX  MIGRAINES   History of bronchitis    History of kidney stones    History of pneumonia    Hypertension    Hypothyroidism    Obesity    OSA on CPAP    Plantar fasciitis    right   PONV (postoperative nausea and vomiting)    pt has never had any anesthesia complications, no post-op nausea or vomiting   Renal calculi    Renal calculi    Rheumatoid arthritis (HCC)    RLS (restless legs syndrome) 08/23/2014     Past Surgical History:  Procedure Laterality Date   ABDOMINAL HYSTERECTOMY     partial   APPENDECTOMY     Arthroscopic surgery, knee Left    BREAST BIOPSY Right    several   BREAST BIOPSY Right 03/11/2017   u/s bx neg   BUNIONECTOMY     LEFT    COLONOSCOPY W/ POLYPECTOMY     COLONOSCOPY WITH PROPOFOL N/A 08/14/2017   Procedure: COLONOSCOPY WITH PROPOFOL;  Surgeon: Lollie Sails, MD;  Location: [facility] ENDOSCOPY;  Service: Endoscopy;  Laterality: N/A;   EXTRACORPOREAL SHOCK WAVE LITHOTRIPSY Left 05/14/2017   Procedure: EXTRACORPOREAL SHOCK WAVE LITHOTRIPSY (ESWL);  Surgeon: Abbie Sons, MD;  Location: [facility] ORS;  Service: Urology;  Laterality: Left;   EXTRACORPOREAL SHOCK WAVE LITHOTRIPSY Left 06/14/2020   Procedure: EXTRACORPOREAL SHOCK WAVE LITHOTRIPSY (ESWL);  Surgeon: Billey Co, MD;  Location: [facility] ORS;  Service: Urology;  Laterality: Left;   FOOT SURGERY     RIGHT     KNEE  ARTHROSCOPY W/ MENISCAL REPAIR Left    LASIK     LUMBAR LAMINECTOMY/DECOMPRESSION MICRODISCECTOMY Right 03/01/2021   Procedure: Microdiscectomy - Lumbar five-Sacral one - right;  Surgeon: Eustace Moore, MD;  Location: [place];  Service: Neurosurgery;  Laterality: Right;   MAXIMUM ACCESS (MAS)POSTERIOR LUMBAR INTERBODY FUSION (PLIF) 1 LEVEL N/A 04/25/2016   Procedure: LUMBAR FOUR-FIVE  MAXIMUM ACCESS (MAS) POSTERIOR LUMBAR INTERBODY FUSION (PLIF) with extension of instrumentation LUMBAR TWO-FIVE;  Surgeon: Eustace Moore, MD;  Location: [place];  Service: Neurosurgery;  Laterality: N/A;   MAXIMUM ACCESS (MAS)POSTERIOR LUMBAR INTERBODY FUSION (PLIF) 2 LEVEL N/A 12/13/2015   Procedure: Lumbar two-three - Lumbar three-four MAXIMUM ACCESS (MAS) POSTERIOR LUMBAR INTERBODY FUSION (PLIF)  ;  Surgeon: Eustace Moore, MD;  Location: [facility] NEURO ORS;  Service: Neurosurgery;  Laterality: N/A;   REVERSE SHOULDER ARTHROPLASTY Left 03/01/2020   Procedure: REVERSE SHOULDER ARTHROPLASTY;  Surgeon: Corky Mull, MD;  Location: [facility] ORS;  Service: Orthopedics;  Laterality: Left;   SHOULDER SURGERY     RIGHT    TONSILLECTOMY     Patient Active Problem List   Diagnosis Date Noted   S/P lumbar laminectomy 03/01/2021   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  edema associated with type 2 diabetes mellitus ([place]) 01/03/2019   Chronic cough 11/19/2016   Morbid obesity ([place]) 09/26/2016   Left ovarian cyst 05/30/2016   S/P lumbar spinal fusion 12/13/2015   Aortic calcification ([place]) 07/18/2015   Controlled type 2 diabetes mellitus without complication ([place]) 76/72/0947   Thrombocytopenia ([place]) 05/16/2015   Recurrent major depressive disorder, in full remission ([place]) 05/16/2015   Essential (primary) hypertension 05/16/2015   Fatty infiltration of liver 03/15/2015   Aortic valve stenosis, nonrheumatic 01/18/2015   Sciatica of right side 01/09/2015   Type 2 diabetes mellitus ([place]) 11/10/2014    RLS (restless legs syndrome) 08/23/2014   Neuritis or radiculitis due to rupture of lumbar intervertebral disc 06/13/2014   Degeneration of intervertebral disc of lumbar region 06/13/2014   Lumbar radiculitis 06/13/2014   Arthritis 01/23/2014   Arthritis of knee, degenerative 11/15/2013   Depression 09/29/2013   Insomnia 09/29/2013   Apnea, sleep 08/29/2013   History of nephrolithiasis 08/29/2013   Abnormal presence of protein in urine 08/29/2013   Headache, migraine 08/29/2013   BP (high blood pressure) 08/29/2013   HLD (hyperlipidemia) 08/29/2013   Allergic rhinitis 08/29/2013   Intractable migraine without aura 02/18/2013    REFERRING DIAG: Radiculopathy, Lumbosacral Region  THERAPY DIAG:   Chronic right-sided low back pain, unspecified whether sciatica present   Radiculopathy, lumbosacral region   Difficulty in walking, not elsewhere classified  No diagnosis found.  Rationale for Evaluation and Treatment Rehabilitation  PERTINENT HISTORY:  Pt reports chronic low back pain with radiation into the right hip and Rt posterior leg. She is a limited historian due to memory recall deficits. Lumbar surgery has been recommended, however insurance is mandating a 6 week trial of PT. Lumbar surgical history includes: PLIF L4-5, L2-08 Apr 2016, L2/3 & L3/07 Dec 2015, Rt L5-S1 Microdiskectomy/laminectomy 03/01/21  PRECAUTIONS: Fall Risk   SUBJECTIVE:   SUBJECTIVE STATEMENT:  *** Pt reports still having some dizziness off and on. She said it has improved but still occasionally experiences some spinning. She reports continued back pain and that she and her doctor have decided to hold off on surgery for a little longer.   PAIN: Are you having pain? Yes: NPRS scale: 4/10 Pain location: R side low back Pain description: none     TODAY'S TREATMENT: *** TherEx: Heat donned to low back for the following intervention (pt reports nothing currently applied to low back to prevent use of  heat, reports improvement in discomfort with heat)  Nustep interval training - cuing for SPM >60, assist with set-up. PT adjusts intensity throughout and monitors pt for exercise response.  Lvl 1 x 1 min 30 sec Lvl 3 x  1 min Lvl 4 x 1 min Lvl 3 x 1 min Lvl 1 x 1 min   Physioball FWD/BCKWD/LTL  15x, cuing to modify range of motion to increase comfort with intervention  NMR: Dix-hallpike- Pt exhibits non-fatiguing rotational R down-beating jerk nystagmus (slow) with both testing of L and R sides. Pt reports perceived dizziness/vertigo lasts for several minutes and only resolves when pt brought back into seated position. This indicates that remaining nystagmus observed is possibly central in origin. Pt also reports hx of migraines. Will continue to monitor. She does report overall her vertigo has improved since previous appointment with author. Will reassess next visit and provide treatment as indicated. Pt might benefit from follow-up with neurologist.   Oculomotor screen- Smooth pursuit: saccades noted, reports some difficulty focusing on target Saccades: WNL Pt provides  education on post-testing precautions , instructed to not try anything challenging to balance today.  PATIENT EDUCATION: Education details: positional testing and purpose, repositioning maneuvers, exercise technique, body mechanics, plan  Person educated: Patient Education method: Explanation, Tactile cues, and Verbal cues Education comprehension: verbalized understanding, returned demonstration, verbal cues required, and tactile cues required    HOME EXERCISE PROGRAM: Access Code: [email or web address] URL: [email or web address] Date: 04/04/2022 Prepared by: [place]  Exercises - Isometric Shoulder Flexion at Wall  - 1 x daily - 7 x weekly - 2 sets - 10 reps - 3 hold - Isometric Shoulder Extension at Wall  - 1 x daily - 7 x weekly - 2 sets - 10 reps - 3 hold - Isometric Shoulder Abduction at Wall  - 1 x  daily - 7 x weekly - 2 sets - 10 reps - 3 hold - Isometric Shoulder Adduction  - 1 x daily - 7 x weekly - 2 sets - 10 reps - 3 hold    Plan - 02/26/22 0855     Clinical Impression Statement *** Author followed-up with pt regarding vertigo. She reported overall this has improved but still experiencing spinning occ. Dix-hallpike reassessment completed and pt exhibited non-fatiguing rotational R down-beating jerk nystagmus (slow) with both testing of L and R sides. Will continue to monitor and reassess, but this could be central in origin, and PT suggested to pt she may benefit from follow-up with neurologist should vertigo not resolve. The pt will continue to benefit from skilled therapy to address remaining deficits in order to improve overall QoL and return to PLOF.         Personal Factors and Comorbidities Comorbidity 3+;Age;Fitness;Past/Current Experience;Time since onset of injury/illness/exacerbation    Comorbidities memory impairment, anxiety, depression, DM, hx of kidney stones, obesity, RA    Examination-Activity Limitations Bed Mobility;Stand;Lift;Locomotion Level;Bend;Transfers;Carry;Continence;Sit;Sleep;Stairs;Squat    Examination-Participation Restrictions Church;Meal Prep;Cleaning;Community Activity;Driving;Interpersonal Relationship;Laundry;Yard Work    [employer] Evolving/Moderate complexity    Rehab Potential Poor    PT Frequency 2x / week    PT Duration 6 weeks    PT Treatment/Interventions Balance training;Neuromuscular re-education;Therapeutic activities;Patient/family education;Manual techniques;Therapeutic exercise;Electrical Stimulation;Cryotherapy;Moist Heat;Stair training;Functional mobility training;DME Instruction;Gait training;Passive range of motion;Dry needling    PT Next Visit Plan Continue with work toward goal achievement    PT Home Exercise Plan Access Code: [email or web address] URL: [email or web address] Date: 03/06/2022 Prepared  by: Rebbeca Paul  Exercises - Supine Hip Adduction Isometric with Ball  - 1 x daily - 5 x weekly - 3 sets - 15 reps - 3 seconds hold - Sit to Stand with Counter Support  - 1 x daily - 5 x weekly - 3 sets - 10 reps - Seated Long Arc Quad  - 1 x daily - 5 x weekly - 3 sets - 10 reps - 3 hold - Side Stepping with Counter Support  - 1 x daily - 5 x weekly - 3 sets - 20 reps   Consulted and Agree with Plan of Care Patient                 PT Short Term Goals - TARGET DATE 04/29/2022        PT SHORT TERM GOAL #1   Title Pt will be independent with her initial HEP to decrease pain, improve strength, function, and ability to perform transfers more comfortably and with less difficulty.  03/11/22: limited by ST memory deficits;    Time 4   Period Weeks    Status Ongoing  Target Date       PT SHORT TERM GOAL #2   Title Pt will demonstrate improved tolerance to overground AMB by> 563f without pain limitation:    Baseline eval: <3069f 03/11/22: 45041fithout pain exacerbation, no AD, then [id number] without pain exacerbation;  04/04/22: 1200 ft without pain   Time 4   Period Weeks    Status MET   Target Date       PT SHORT TERM GOAL #3   Title Pt to demonstrate improved tolerance to power activities AEB 5xSTS <15 secs    Baseline Eval: 19sec; 03/11/22: 11.45sec; 04/04/22: 11.63 sec   Time 4   Period Weeks    Status MET   Target Date 03/18/22                                     PT SHORT TERM GOAL #4  Title Pt will understand precautions, including post-maneuver precautions, and technique with canalith repositioning maneuver in order to reduce dizziness and decrease fall risk  Baseline 11/28: pt verbalized understanding for all, successfully completes maneuver in session with PT  Time 4  Period Weeks   Status MET  Target Date         PT Long Term Goals - TARGET DATE 05/13/2022          PT LONG TERM GOAL #1   Title Pt to improve score on FOTO survery by >8  points to indicate improved self efficicacy in ADL/IADL mobility.    Baseline Eval: 40; 04/04/22: 52   Time 6    Period Weeks    Status MET    Target Date 05/13/22     PT LONG TERM GOAL #2   Title Pt to demonstrate improved standing tolerance at home to >20 minutes.    Baseline eval: 5-10 minutes; 04/04/22: 30 minutes   Time 6    Period Weeks    Status New    Target Date 05/13/22     PT LONG TERM GOAL #3   Title Pt to demonstrate tolerance to 6MWT without need to sit during testing, performance >1000ft33f Baseline eval: tolerates less than 2 minutes of walking.  03/11/22: c 4WW AMB [id number] pain exacerbation (750ft) 26f1/23: c 4WW AMB 1207 ft without pain   Time 6    Period Weeks    Status MET    Target Date 05/13/21   PT LONG TERM GOAL #4  Title Pt to demonstrate tolerance to 6MWT without AD or need to sit during testing, performance >1000ft.  58feline 04/04/22: Pt able to perform 1207 ft c 4WW   Time 4  Period Weeks   Status NEW   Target Date 05/13/21             Bevelyn Arriola RGwenlyn SaranT Physical Therapist- [facility]23, 8:45 AM

## 2022-04-17 ENCOUNTER — Ambulatory Visit: Payer: Medicare HMO

## 2022-04-17 DIAGNOSIS — R2681 Unsteadiness on feet: Secondary | ICD-10-CM

## 2022-04-17 DIAGNOSIS — M6281 Muscle weakness (generalized): Secondary | ICD-10-CM

## 2022-04-17 DIAGNOSIS — M545 Low back pain, unspecified: Secondary | ICD-10-CM

## 2022-04-17 DIAGNOSIS — M5417 Radiculopathy, lumbosacral region: Secondary | ICD-10-CM

## 2022-04-17 DIAGNOSIS — R42 Dizziness and giddiness: Secondary | ICD-10-CM

## 2022-04-17 DIAGNOSIS — M5416 Radiculopathy, lumbar region: Secondary | ICD-10-CM

## 2022-04-17 DIAGNOSIS — R262 Difficulty in walking, not elsewhere classified: Secondary | ICD-10-CM

## 2022-04-17 NOTE — Therapy (Signed)
OUTPATIENT PHYSICAL THERAPY TREATMENT NOTE    Patient Name: Jessica Cole MRN: [id number] DOB:04/09/1951, 71 y.o., female Today's Date: 04/17/2022  PCP: Adin Hector, MD REFERRING PROVIDER: Eustace Moore, MD   PT End of Session - 04/17/22 0849     Visit Number 13    Number of Visits 21   *corrected to reflect recent recertification   Date for PT Re-Evaluation 05/13/22    Authorization Type [employer]    Authorization Time Period 02/24/22-04/07/22    Progress Note Due on Visit 10    PT Start Time 0848    PT Stop Time 0930    PT Time Calculation (min) 42 min    Equipment Utilized During Treatment Gait belt    Activity Tolerance Patient tolerated treatment well;Patient limited by pain    Behavior During Therapy WFL for tasks assessed/performed              Past Medical History:  Diagnosis Date   Anxiety    Asthma    seasonal with allergies   Colon polyps    Degenerative arthritis    Depression    Diabetes mellitus without complication (HCC)    Type II   Environmental allergies    Family history of adverse reaction to anesthesia    sister- PONV   Fatty liver    Glaucoma    Headache(784.0)    HX  MIGRAINES   History of bronchitis    History of kidney stones    History of pneumonia    Hypertension    Hypothyroidism    Obesity    OSA on CPAP    Plantar fasciitis    right   PONV (postoperative nausea and vomiting)    pt has never had any anesthesia complications, no post-op nausea or vomiting   Renal calculi    Renal calculi    Rheumatoid arthritis (HCC)    RLS (restless legs syndrome) 08/23/2014     Past Surgical History:  Procedure Laterality Date   ABDOMINAL HYSTERECTOMY     partial   APPENDECTOMY     Arthroscopic surgery, knee Left    BREAST BIOPSY Right    several   BREAST BIOPSY Right 03/11/2017   u/s bx neg   BUNIONECTOMY     LEFT    COLONOSCOPY W/ POLYPECTOMY     COLONOSCOPY WITH PROPOFOL N/A 08/14/2017   Procedure:  COLONOSCOPY WITH PROPOFOL;  Surgeon: Lollie Sails, MD;  Location[facility] END[facility]COPY;  Service: Endoscopy;  Laterality: N/A;   EXTRACORPOREAL SHOCK WAVE LITHOTRIPSY Left 05/14/2017   Procedure: EXTRACORPOREAL SHOCK WAVE LITHOTRIPSY (ESWL);  Surgeon: Abbie Sons, MD;  Location: [facility] ORS;  Service: Urology;  Laterality: Left;   EXTRACORPOREAL SHOCK WAVE LITHOTRIPSY Left 06/14/2020   Procedure: EXTRACORPOREAL SHOCK WAVE LITHOTRIPSY (ESWL);  Surgeon: Billey Co, MD;  Location: [facility] ORS;  Service: Urology;  Laterality: Left;   FOOT SURGERY     RIGHT     KNEE ARTHROSCOPY W/ MENISCAL REPAIR Left    LASIK     LUMBAR LAMINECTOMY/DECOMPRESSION MICRODISCECTOMY Right 03/01/2021   Procedure: Microdiscectomy - Lumbar five-Sacral one - right;  Surgeon: Eustace Moore, MD;  Location: [facility];  Service: Neurosurgery;  Laterality: Right;   MAXIMUM ACCESS (MAS)POSTERIOR LUMBAR INTERBODY FUSION (PLIF) 1 LEVEL N/A 04/25/2016   Procedure: LUMBAR FOUR-FIVE  MAXIMUM ACCESS (MAS) POSTERIOR LUMBAR INTERBODY FUSION (PLIF) with extension of instrumentation LUMBAR TWO-FIVE;  Surgeon: Eustace Moore, MD;  Location: [place];  Service: Neurosurgery;  Laterality: N/A;   MAXIMUM ACCESS (MAS)POSTERIOR LUMBAR INTERBODY FUSION (PLIF) 2 LEVEL N/A 12/13/2015   Procedure: Lumbar two-three - Lumbar three-four MAXIMUM ACCESS (MAS) POSTERIOR LUMBAR INTERBODY FUSION (PLIF)  ;  Surgeon: Eustace Moore, MD;  Location: [facility] NEURO ORS;  Service: Neurosurgery;  Laterality: N/A;   REVERSE SHOULDER ARTHROPLASTY Left 03/01/2020   Procedure: REVERSE SHOULDER ARTHROPLASTY;  Surgeon: Corky Mull, MD;  Location: [facility] ORS;  Service: Orthopedics;  Laterality: Left;   SHOULDER SURGERY     RIGHT    TONSILLECTOMY     Patient Active Problem List   Diagnosis Date Noted   S/P lumbar laminectomy 03/01/2021   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 edema associated with type 2 diabetes mellitus ([place]) 01/03/2019   Chronic  cough 11/19/2016   Morbid obesity ([place]) 09/26/2016   Left ovarian cyst 05/30/2016   S/P lumbar spinal fusion 12/13/2015   Aortic calcification ([place]) 07/18/2015   Controlled type 2 diabetes mellitus without complication ([place]) 44/05/270   Thrombocytopenia ([place]) 05/16/2015   Recurrent major depressive disorder, in full remission ([place]) 05/16/2015   Essential (primary) hypertension 05/16/2015   Fatty infiltration of liver 03/15/2015   Aortic valve stenosis, nonrheumatic 01/18/2015   Sciatica of right side 01/09/2015   Type 2 diabetes mellitus ([place]) 11/10/2014   RLS (restless legs syndrome) 08/23/2014   Neuritis or radiculitis due to rupture of lumbar intervertebral disc 06/13/2014   Degeneration of intervertebral disc of lumbar region 06/13/2014   Lumbar radiculitis 06/13/2014   Arthritis 01/23/2014   Arthritis of knee, degenerative 11/15/2013   Depression 09/29/2013   Insomnia 09/29/2013   Apnea, sleep 08/29/2013   History of nephrolithiasis 08/29/2013   Abnormal presence of protein in urine 08/29/2013   Headache, migraine 08/29/2013   BP (high blood pressure) 08/29/2013   HLD (hyperlipidemia) 08/29/2013   Allergic rhinitis 08/29/2013   Intractable migraine without aura 02/18/2013    REFERRING DIAG: Radiculopathy, Lumbosacral Region  THERAPY DIAG:   Chronic right-sided low back pain, unspecified whether sciatica present   Radiculopathy, lumbosacral region   Difficulty in walking, not elsewhere classified  Chronic bilateral low back pain, unspecified whether sciatica present  Dizziness and giddiness  Muscle weakness (generalized)  Unsteadiness on feet  Difficulty in walking, not elsewhere classified  Chronic right-sided low back pain, unspecified whether sciatica present  Radiculopathy, lumbosacral region  Radiculopathy, lumbar region  Low back pain, unspecified back pain laterality, unspecified chronicity, unspecified whether sciatica present  Rationale for  Evaluation and Treatment Rehabilitation  PERTINENT HISTORY:  Pt reports chronic low back pain with radiation into the right hip and Rt posterior leg. She is a limited historian due to memory recall deficits. Lumbar surgery has been recommended, however insurance is mandating a 6 week trial of PT. Lumbar surgical history includes: PLIF L4-5, L2-08 Apr 2016, L2/3 & L3/07 Dec 2015, Rt L5-S1 Microdiskectomy/laminectomy 03/01/21  PRECAUTIONS: Fall Risk   SUBJECTIVE:   SUBJECTIVE STATEMENT:  Pt reports she is not having a good day today and thought about not coming to the clinic.  Pt states that she is getting over a migraine and the holidays has caused some strife between her and her husband.  She states she is just not in a good mood.     PAIN: Are you having pain? Yes: NPRS scale: 3-4/10 Pain location: R side low back Pain description: none     TODAY'S TREATMENT:  TherEx:  Nustep interval training - cuing for SPM >60, assist with set-up. PT  adjusts intensity throughout and monitors pt for exercise response.  Lvl 1 x 2 min Lvl 3 x 2 min Lvl 4 x 1 min Lvl 3 x 1 min  Pt noted some increasing low back pain at the end of the Lvl 3 portion so it was discontinued.  Seated physioball roll outs, forward/backward/lateral, x15 each direction with 3 sec holds  Incline plank against // bars for support, 30 sec holds with verbal cuing for good posture (keeping head upright, feet facing forward, hips tucked forward, and shoulders back), x3 attempts.   Standing pallof press, 7.5#, x10 each direction Standing hip hinge exercise with visual, verbal, and tactile cuing with use of PVC pipe to teach proper body mechanics, 2x10 Standing kettlebell deadlift from elevated surafe (6" step), 2x10 visual and verbal cuing given Supine LTR with use of green physioball, 2x10 Supine hamstring curls with use of green physioball, 2x10 Hooklying bridges, 2x10 Supine modified crunches into green physioball, 2-3  sec holds, 2x10     PATIENT EDUCATION: Education details: positional testing and purpose, repositioning maneuvers, exercise technique, body mechanics, plan  Person educated: Patient Education method: Explanation, Tactile cues, and Verbal cues Education comprehension: verbalized understanding, returned demonstration, verbal cues required, and tactile cues required    HOME EXERCISE PROGRAM: Access Code: [email or web address] URL: [email or web address] Date: 04/04/2022 Prepared by: [place]  Exercises - Isometric Shoulder Flexion at Wall  - 1 x daily - 7 x weekly - 2 sets - 10 reps - 3 hold - Isometric Shoulder Extension at Wall  - 1 x daily - 7 x weekly - 2 sets - 10 reps - 3 hold - Isometric Shoulder Abduction at Wall  - 1 x daily - 7 x weekly - 2 sets - 10 reps - 3 hold - Isometric Shoulder Adduction  - 1 x daily - 7 x weekly - 2 sets - 10 reps - 3 hold    Plan - 02/26/22 0855     Clinical Impression Statement  Pt responded well to all exercises given.  Pt notes that she felt good stretches with the tasks given.  Pt encouraged to practice proper lifting mechanics over the weekend as she is preparing for Christmas activities, including baking and cooking in the oven.   Pt will continue to benefit from skilled therapy to address remaining deficits in order to improve overall QoL and return to PLOF.          Personal Factors and Comorbidities Comorbidity 3+;Age;Fitness;Past/Current Experience;Time since onset of injury/illness/exacerbation    Comorbidities memory impairment, anxiety, depression, DM, hx of kidney stones, obesity, RA    Examination-Activity Limitations Bed Mobility;Stand;Lift;Locomotion Level;Bend;Transfers;Carry;Continence;Sit;Sleep;Stairs;Squat    Examination-Participation Restrictions Church;Meal Prep;Cleaning;Community Activity;Driving;Interpersonal Relationship;Laundry;Yard Work    [employer] Evolving/Moderate complexity    Rehab  Potential Poor    PT Frequency 2x / week    PT Duration 6 weeks    PT Treatment/Interventions Balance training;Neuromuscular re-education;Therapeutic activities;Patient/family education;Manual techniques;Therapeutic exercise;Electrical Stimulation;Cryotherapy;Moist Heat;Stair training;Functional mobility training;DME Instruction;Gait training;Passive range of motion;Dry needling    PT Next Visit Plan Continue with work toward goal achievement    PT Home Exercise Plan Access Code: [email or web address] URL: [email or web address] Date: 03/06/2022 Prepared by: Rebbeca Paul  Exercises - Supine Hip Adduction Isometric with Ball  - 1 x daily - 5 x weekly - 3 sets - 15 reps - 3 seconds hold - Sit to Stand with Counter Support  - 1 x daily - 5 x weekly - 3 sets - 10 reps - Seated Long Arc  Quad  - 1 x daily - 5 x weekly - 3 sets - 10 reps - 3 hold - Side Stepping with Counter Support  - 1 x daily - 5 x weekly - 3 sets - 20 reps   Consulted and Agree with Plan of Care Patient                 PT Short Term Goals - TARGET DATE 04/29/2022        PT SHORT TERM GOAL #1   Title Pt will be independent with her initial HEP to decrease pain, improve strength, function, and ability to perform transfers more comfortably and with less difficulty.  03/11/22: limited by ST memory deficits;    Time 4   Period Weeks    Status Ongoing    Target Date       PT SHORT TERM GOAL #2   Title Pt will demonstrate improved tolerance to overground AMB by> 522f without pain limitation:    Baseline eval: <3092f 03/11/22: 45066fithout pain exacerbation, no AD, then [id number] without pain exacerbation;  04/04/22: 1200 ft without pain   Time 4   Period Weeks    Status MET   Target Date       PT SHORT TERM GOAL #3   Title Pt to demonstrate improved tolerance to power activities AEB 5xSTS <15 secs    Baseline Eval: 19sec; 03/11/22: 11.45sec; 04/04/22: 11.63 sec   Time 4   Period Weeks    Status MET    Target Date 03/18/22                                     PT SHORT TERM GOAL #4  Title Pt will understand precautions, including post-maneuver precautions, and technique with canalith repositioning maneuver in order to reduce dizziness and decrease fall risk  Baseline 11/28: pt verbalized understanding for all, successfully completes maneuver in session with PT  Time 4  Period Weeks   Status MET  Target Date         PT Long Term Goals - TARGET DATE 05/13/2022          PT LONG TERM GOAL #1   Title Pt to improve score on FOTO survery by >8 points to indicate improved self efficicacy in ADL/IADL mobility.    Baseline Eval: 40; 04/04/22: 52   Time 6    Period Weeks    Status MET    Target Date 05/13/22     PT LONG TERM GOAL #2   Title Pt to demonstrate improved standing tolerance at home to >20 minutes.    Baseline eval: 5-10 minutes; 04/04/22: 30 minutes   Time 6    Period Weeks    Status New    Target Date 05/13/22     PT LONG TERM GOAL #3   Title Pt to demonstrate tolerance to 6MWT without need to sit during testing, performance >1000ft20f Baseline eval: tolerates less than 2 minutes of walking.  03/11/22: c 4WW AMB [id number] pain exacerbation (750ft) 56f1/23: c 4WW AMB 1207 ft without pain   Time 6    Period Weeks    Status MET    Target Date 05/13/21   PT LONG TERM GOAL #4  Title Pt to demonstrate tolerance to 6MWT without AD or need to sit during testing, performance >1000ft.  63feline 04/04/22: Pt able to  perform 1207 ft c 4WW   Time 4  Period Weeks   Status NEW   Target Date 05/13/21             Gwenlyn Saran, PT, DPT Physical Therapist- [facility]  04/17/22, 8:51 AM

## 2022-04-21 NOTE — Therapy (Incomplete)
OUTPATIENT PHYSICAL THERAPY TREATMENT NOTE    Patient Name: Jessica Cole MRN: [id number] DOB:05/02/1951, 71 y.o., female Today's Date: 04/21/2022  PCP: Adin Hector, MD REFERRING PROVIDER: Eustace Moore, MD      Past Medical History:  Diagnosis Date   Anxiety    Asthma    seasonal with allergies   Colon polyps    Degenerative arthritis    Depression    Diabetes mellitus without complication ([place])    Type II   Environmental allergies    Family history of adverse reaction to anesthesia    sister- PONV   Fatty liver    Glaucoma    Headache(784.0)    HX  MIGRAINES   History of bronchitis    History of kidney stones    History of pneumonia    Hypertension    Hypothyroidism    Obesity    OSA on CPAP    Plantar fasciitis    right   PONV (postoperative nausea and vomiting)    pt has never had any anesthesia complications, no post-op nausea or vomiting   Renal calculi    Renal calculi    Rheumatoid arthritis (HCC)    RLS (restless legs syndrome) 08/23/2014     Past Surgical History:  Procedure Laterality Date   ABDOMINAL HYSTERECTOMY     partial   APPENDECTOMY     Arthroscopic surgery, knee Left    BREAST BIOPSY Right    several   BREAST BIOPSY Right 03/11/2017   u/s bx neg   BUNIONECTOMY     LEFT    COLONOSCOPY W/ POLYPECTOMY     COLONOSCOPY WITH PROPOFOL N/A 08/14/2017   Procedure: COLONOSCOPY WITH PROPOFOL;  Surgeon: Lollie Sails, MD;  Location: [facility] ENDOSCOPY;  Service: Endoscopy;  Laterality: N/A;   EXTRACORPOREAL SHOCK WAVE LITHOTRIPSY Left 05/14/2017   Procedure: EXTRACORPOREAL SHOCK WAVE LITHOTRIPSY (ESWL);  Surgeon: Abbie Sons, MD;  Location: [facility] ORS;  Service: Urology;  Laterality: Left;   EXTRACORPOREAL SHOCK WAVE LITHOTRIPSY Left 06/14/2020   Procedure: EXTRACORPOREAL SHOCK WAVE LITHOTRIPSY (ESWL);  Surgeon: Billey Co, MD;  Location: [facility] ORS;  Service: Urology;  Laterality: Left;   FOOT SURGERY     RIGHT     KNEE  ARTHROSCOPY W/ MENISCAL REPAIR Left    LASIK     LUMBAR LAMINECTOMY/DECOMPRESSION MICRODISCECTOMY Right 03/01/2021   Procedure: Microdiscectomy - Lumbar five-Sacral one - right;  Surgeon: Eustace Moore, MD;  Location: [place];  Service: Neurosurgery;  Laterality: Right;   MAXIMUM ACCESS (MAS)POSTERIOR LUMBAR INTERBODY FUSION (PLIF) 1 LEVEL N/A 04/25/2016   Procedure: LUMBAR FOUR-FIVE  MAXIMUM ACCESS (MAS) POSTERIOR LUMBAR INTERBODY FUSION (PLIF) with extension of instrumentation LUMBAR TWO-FIVE;  Surgeon: Eustace Moore, MD;  Location: [place];  Service: Neurosurgery;  Laterality: N/A;   MAXIMUM ACCESS (MAS)POSTERIOR LUMBAR INTERBODY FUSION (PLIF) 2 LEVEL N/A 12/13/2015   Procedure: Lumbar two-three - Lumbar three-four MAXIMUM ACCESS (MAS) POSTERIOR LUMBAR INTERBODY FUSION (PLIF)  ;  Surgeon: Eustace Moore, MD;  Location: [facility] NEURO ORS;  Service: Neurosurgery;  Laterality: N/A;   REVERSE SHOULDER ARTHROPLASTY Left 03/01/2020   Procedure: REVERSE SHOULDER ARTHROPLASTY;  Surgeon: Corky Mull, MD;  Location: [facility] ORS;  Service: Orthopedics;  Laterality: Left;   SHOULDER SURGERY     RIGHT    TONSILLECTOMY     Patient Active Problem List   Diagnosis Date Noted   S/P lumbar laminectomy 03/01/2021   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 edema associated with type 2 diabetes mellitus ([place]) 01/03/2019   Chronic cough 11/19/2016   Morbid obesity ([place]) 09/26/2016   Left ovarian cyst 05/30/2016   S/P lumbar spinal fusion 12/13/2015   Aortic calcification ([place]) 07/18/2015   Controlled type 2 diabetes mellitus without complication ([place]) 86/57/8469   Thrombocytopenia ([place]) 05/16/2015   Recurrent major depressive disorder, in full remission ([place]) 05/16/2015   Essential (primary) hypertension 05/16/2015   Fatty infiltration of liver 03/15/2015   Aortic valve stenosis, nonrheumatic 01/18/2015   Sciatica of right side 01/09/2015   Type 2 diabetes mellitus ([place]) 11/10/2014    RLS (restless legs syndrome) 08/23/2014   Neuritis or radiculitis due to rupture of lumbar intervertebral disc 06/13/2014   Degeneration of intervertebral disc of lumbar region 06/13/2014   Lumbar radiculitis 06/13/2014   Arthritis 01/23/2014   Arthritis of knee, degenerative 11/15/2013   Depression 09/29/2013   Insomnia 09/29/2013   Apnea, sleep 08/29/2013   History of nephrolithiasis 08/29/2013   Abnormal presence of protein in urine 08/29/2013   Headache, migraine 08/29/2013   BP (high blood pressure) 08/29/2013   HLD (hyperlipidemia) 08/29/2013   Allergic rhinitis 08/29/2013   Intractable migraine without aura 02/18/2013    REFERRING DIAG: Radiculopathy, Lumbosacral Region  THERAPY DIAG:   Chronic right-sided low back pain, unspecified whether sciatica present   Radiculopathy, lumbosacral region   Difficulty in walking, not elsewhere classified  No diagnosis found.  Rationale for Evaluation and Treatment Rehabilitation  PERTINENT HISTORY:  Pt reports chronic low back pain with radiation into the right hip and Rt posterior leg. She is a limited historian due to memory recall deficits. Lumbar surgery has been recommended, however insurance is mandating a 6 week trial of PT. Lumbar surgical history includes: PLIF L4-5, L2-08 Apr 2016, L2/3 & L3/07 Dec 2015, Rt L5-S1 Microdiskectomy/laminectomy 03/01/21  PRECAUTIONS: Fall Risk   SUBJECTIVE:   SUBJECTIVE STATEMENT:  ***   PAIN: Are you having pain? Yes: NPRS scale: 3-4/10 Pain location: R side low back Pain description: none     TODAY'S TREATMENT:  TherEx:  Nustep interval training - cuing for SPM >60, assist with set-up. PT adjusts intensity throughout and monitors pt for exercise response.  Lvl 1 x 2 min Lvl 3 x 2 min Lvl 4 x 1 min Lvl 3 x 1 min  Pt noted some increasing low back pain at the end of the Lvl 3 portion so it was discontinued.  Seated physioball roll outs, forward/backward/lateral, x15 each  direction with 3 sec holds  Incline plank against // bars for support, 30 sec holds with verbal cuing for good posture (keeping head upright, feet facing forward, hips tucked forward, and shoulders back), x3 attempts.   Standing pallof press, 7.5#, x10 each direction Standing hip hinge exercise with visual, verbal, and tactile cuing with use of PVC pipe to teach proper body mechanics, 2x10 Standing kettlebell deadlift from elevated surafe (6" step), 2x10 visual and verbal cuing given Supine LTR with use of green physioball, 2x10 Supine hamstring curls with use of green physioball, 2x10 Hooklying bridges, 2x10 Supine modified crunches into green physioball, 2-3 sec holds, 2x10     PATIENT EDUCATION: Education details: positional testing and purpose, repositioning maneuvers, exercise technique, body mechanics, plan  Person educated: Patient Education method: Explanation, Tactile cues, and Verbal cues Education comprehension: verbalized understanding, returned demonstration, verbal cues required, and tactile cues required    HOME EXERCISE PROGRAM: Access Code: [email or web address] URL: [email or web address] Date: 04/04/2022 Prepared by: [place]  Exercises - Isometric Shoulder Flexion at Wall  - 1 x daily - 7 x weekly - 2 sets - 10 reps - 3 hold - Isometric Shoulder Extension at Wall  - 1 x daily - 7 x weekly - 2 sets - 10 reps - 3 hold - Isometric Shoulder Abduction at Wall  - 1 x daily - 7 x weekly - 2 sets - 10 reps - 3 hold - Isometric Shoulder Adduction  - 1 x daily - 7 x weekly - 2 sets - 10 reps - 3 hold    Plan - 02/26/22 0855     Clinical Impression Statement  *** Pt will continue to benefit from skilled therapy to address remaining deficits in order to improve overall QoL and return to PLOF.          Personal Factors and Comorbidities Comorbidity 3+;Age;Fitness;Past/Current Experience;Time since onset of injury/illness/exacerbation    Comorbidities memory  impairment, anxiety, depression, DM, hx of kidney stones, obesity, RA    Examination-Activity Limitations Bed Mobility;Stand;Lift;Locomotion Level;Bend;Transfers;Carry;Continence;Sit;Sleep;Stairs;Squat    Examination-Participation Restrictions Church;Meal Prep;Cleaning;Community Activity;Driving;Interpersonal Relationship;Laundry;Yard Work    Merchant [employer] officer Evolving/Moderate complexity    Rehab Potential Poor    PT Frequency 2x / week    PT Duration 6 weeks    PT Treatment/Interventions Balance training;Neuromuscular re-education;Therapeutic activities;Patient/family education;Manual techniques;Therapeutic exercise;Electrical Stimulation;Cryotherapy;Moist Heat;Stair training;Functional mobility training;DME Instruction;Gait training;Passive range of motion;Dry needling    PT Next Visit Plan Continue with work toward goal achievement    PT Home Exercise Plan Access Code: [email or web address] URL: [email or web address] Date: 03/06/2022 Prepared by: Rebbeca Paul  Exercises - Supine Hip Adduction Isometric with Ball  - 1 x daily - 5 x weekly - 3 sets - 15 reps - 3 seconds hold - Sit to Stand with Counter Support  - 1 x daily - 5 x weekly - 3 sets - 10 reps - Seated Long Arc Quad  - 1 x daily - 5 x weekly - 3 sets - 10 reps - 3 hold - Side Stepping with Counter Support  - 1 x daily - 5 x weekly - 3 sets - 20 reps   Consulted and Agree with Plan of Care Patient                 PT Short Term Goals - TARGET DATE 04/29/2022        PT SHORT TERM GOAL #1   Title Pt will be independent with her initial HEP to decrease pain, improve strength, function, and ability to perform transfers more comfortably and with less difficulty.  03/11/22: limited by ST memory deficits;    Time 4   Period Weeks    Status Ongoing    Target Date       PT SHORT TERM GOAL #2   Title Pt will demonstrate improved tolerance to overground AMB by> 519f without pain limitation:    Baseline  eval: <3028f 03/11/22: 45040fithout pain exacerbation, no AD, then 750f26f4WW without pain exacerbation;  04/04/22: 1200 ft without pain   Time 4   Period Weeks    Status MET   Target Date       PT SHORT TERM GOAL #3   Title Pt to demonstrate improved tolerance to power activities AEB 5xSTS <15 secs    Baseline Eval: 19sec; 03/11/22: 11.45sec; 04/04/22: 11.63 sec   Time 4   Period Weeks    Status MET   Target Date 03/18/22  PT SHORT TERM GOAL #4  Title Pt will understand precautions, including post-maneuver precautions, and technique with canalith repositioning maneuver in order to reduce dizziness and decrease fall risk  Baseline 11/28: pt verbalized understanding for all, successfully completes maneuver in session with PT  Time 4  Period Weeks   Status MET  Target Date         PT Long Term Goals - TARGET DATE 05/13/2022          PT LONG TERM GOAL #1   Title Pt to improve score on FOTO survery by >8 points to indicate improved self efficicacy in ADL/IADL mobility.    Baseline Eval: 40; 04/04/22: 52   Time 6    Period Weeks    Status MET    Target Date 05/13/22     PT LONG TERM GOAL #2   Title Pt to demonstrate improved standing tolerance at home to >20 minutes.    Baseline eval: 5-10 minutes; 04/04/22: 30 minutes   Time 6    Period Weeks    Status New    Target Date 05/13/22     PT LONG TERM GOAL #3   Title Pt to demonstrate tolerance to 6MWT without need to sit during testing, performance >1078f.    Baseline eval: tolerates less than 2 minutes of walking.  03/11/22: c 4WW AMB 454ms without pain exacerbation (75068f 04/04/22: c 4WW AMB 1207 ft without pain   Time 6    Period Weeks    Status MET    Target Date 05/13/21   PT LONG TERM GOAL #4  Title Pt to demonstrate tolerance to 6MWT without AD or need to sit during testing, performance >1000f28f Baseline 04/04/22: Pt able to perform 1207 ft c 4WW   Time 4  Period Weeks    Status NEW   Target Date 05/13/21             MariJanna Arch, DPT  Physical Therapist- [facility]/18/23, 12:36 PM

## 2022-04-22 ENCOUNTER — Ambulatory Visit: Payer: Medicare HMO

## 2022-04-22 NOTE — Therapy (Deleted)
OUTPATIENT PHYSICAL THERAPY TREATMENT NOTE    Patient Name: Jessica Cole MRN: [id number] DOB:[birth year], 71 y.o., female Today's Date: 04/22/2022  PCP: Adin Hector, MD REFERRING PROVIDER: Eustace Moore, MD      Past Medical History:  Diagnosis Date   Anxiety    Asthma    seasonal with allergies   Colon polyps    Degenerative arthritis    Depression    Diabetes mellitus without complication ([place])    Type II   Environmental allergies    Family history of adverse reaction to anesthesia    sister- PONV   Fatty liver    Glaucoma    Headache(784.0)    HX  MIGRAINES   History of bronchitis    History of kidney stones    History of pneumonia    Hypertension    Hypothyroidism    Obesity    OSA on CPAP    Plantar fasciitis    right   PONV (postoperative nausea and vomiting)    pt has never had any anesthesia complications, no post-op nausea or vomiting   Renal calculi    Renal calculi    Rheumatoid arthritis (HCC)    RLS (restless legs syndrome) 08/23/2014     Past Surgical History:  Procedure Laterality Date   ABDOMINAL HYSTERECTOMY     partial   APPENDECTOMY     Arthroscopic surgery, knee Left    BREAST BIOPSY Right    several   BREAST BIOPSY Right 03/11/2017   u/s bx neg   BUNIONECTOMY     LEFT    COLONOSCOPY W/ POLYPECTOMY     COLONOSCOPY WITH PROPOFOL N/A 08/14/2017   Procedure: COLONOSCOPY WITH PROPOFOL;  Surgeon: Lollie Sails, MD;  [facility] ENDOSCOPY;  Service: Endoscopy;  Laterality: N/A;   EXTRACORPOREAL SHOCK WAVE LITHOTRIPSY Left 05/14/2017   Procedure: EXTRACORPOREAL SHOCK WAVE LITHOTRIPSY (ESWL);  Surgeon: Abbie Sons, MD;  Location: [facility] ORS;  Service: Urology;  Laterality: Left;   EXTRACORPOREAL SHOCK WAVE LITHOTRIPSY Left 06/14/2020   Procedure: EXTRACORPOREAL SHOCK WAVE LITHOTRIPSY (ESWL);  Surgeon: Billey Co, MD;  Location: [facility] ORS;  Service: Urology;  Laterality: Left;   FOOT SURGERY     RIGHT     KNEE  ARTHROSCOPY W/ MENISCAL REPAIR Left    LASIK     LUMBAR LAMINECTOMY/DECOMPRESSION MICRODISCECTOMY Right 03/01/2021   Procedure: Microdiscectomy - Lumbar five-Sacral one - right;  Surgeon: Eustace Moore, MD;  Location: [place];  Service: Neurosurgery;  Laterality: Right;   MAXIMUM ACCESS (MAS)POSTERIOR LUMBAR INTERBODY FUSION (PLIF) 1 LEVEL N/A 04/25/2016   Procedure: LUMBAR FOUR-FIVE  MAXIMUM ACCESS (MAS) POSTERIOR LUMBAR INTERBODY FUSION (PLIF) with extension of instrumentation LUMBAR TWO-FIVE;  Surgeon: Eustace Moore, MD;  Location: [place];  Service: Neurosurgery;  Laterality: N/A;   MAXIMUM ACCESS (MAS)POSTERIOR LUMBAR INTERBODY FUSION (PLIF) 2 LEVEL N/A 12/13/2015   Procedure: Lumbar two-three - Lumbar three-four MAXIMUM ACCESS (MAS) POSTERIOR LUMBAR INTERBODY FUSION (PLIF)  ;  Surgeon: Eustace Moore, MD;  Location[facility] NEURO ORS;  Service: Neurosurgery;  Laterality: N/A;   REVERSE SHOULDER ARTHROPLASTY Left 03/01/2020   Procedure: REVERSE SHOULDER ARTHROPLASTY;  Surgeon: Corky Mull, MD;  Location: [facility] ORS;  Service: Orthopedics;  Laterality: Left;   SHOULDER SURGERY     RIGHT    TONSILLECTOMY     Patient Active Problem List   Diagnosis Date Noted   S/P lumbar laminectomy 03/01/2021   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 edema associated with type 2 diabetes mellitus ([place]) 01/03/2019   Chronic cough 11/19/2016   Morbid obesity ([place]) 09/26/2016   Left ovarian cyst 05/30/2016   S/P lumbar spinal fusion 12/13/2015   Aortic calcification ([place]) 07/18/2015   Controlled type 2 diabetes mellitus without complication ([place]) 90/24/0973   Thrombocytopenia ([place]) 05/16/2015   Recurrent major depressive disorder, in full remission ([place] ) 05/16/2015   Essential (primary) hypertension 05/16/2015   Fatty infiltration of liver 03/15/2015   Aortic valve stenosis, nonrheumatic 01/18/2015   Sciatica of right side 01/09/2015   Type 2 diabetes mellitus ([place]) 11/10/2014    RLS (restless legs syndrome) 08/23/2014   Neuritis or radiculitis due to rupture of lumbar intervertebral disc 06/13/2014   Degeneration of intervertebral disc of lumbar region 06/13/2014   Lumbar radiculitis 06/13/2014   Arthritis 01/23/2014   Arthritis of knee, degenerative 11/15/2013   Depression 09/29/2013   Insomnia 09/29/2013   Apnea, sleep 08/29/2013   History of nephrolithiasis 08/29/2013   Abnormal presence of protein in urine 08/29/2013   Headache, migraine 08/29/2013   BP (high blood pressure) 08/29/2013   HLD (hyperlipidemia) 08/29/2013   Allergic rhinitis 08/29/2013   Intractable migraine without aura 02/18/2013    REFERRING DIAG: Radiculopathy, Lumbosacral Region  THERAPY DIAG:   Chronic right-sided low back pain, unspecified whether sciatica present   Radiculopathy, lumbosacral region   Difficulty in walking, not elsewhere classified  No diagnosis found.  Rationale for Evaluation and Treatment Rehabilitation  PERTINENT HISTORY:  Pt reports chronic low back pain with radiation into the right hip and Rt posterior leg. She is a limited historian due to memory recall deficits. Lumbar surgery has been recommended, however insurance is mandating a 6 week trial of PT. Lumbar surgical history includes: PLIF L4-5, L2-08 Apr 2016, L2/3 & L3/07 Dec 2015, Rt L5-S1 Microdiskectomy/laminectomy 03/01/21  PRECAUTIONS: Fall Risk   SUBJECTIVE:   SUBJECTIVE STATEMENT:  *** Pt reports she is not having a good day today and thought about not coming to the clinic.  Pt states that she is getting over a migraine and the holidays has caused some strife between her and her husband.  She states she is just not in a good mood.     PAIN: Are you having pain? Yes: NPRS scale: 3-4/10 Pain location: R side low back Pain description: none     TODAY'S TREATMENT:  TherEx: *** Nustep interval training - cuing for SPM >60, assist with set-up. PT adjusts intensity throughout and  monitors pt for exercise response.  Lvl 1 x 2 min Lvl 3 x 2 min Lvl 4 x 1 min Lvl 3 x 1 min  Pt noted some increasing low back pain at the end of the Lvl 3 portion so it was discontinued.  Seated physioball roll outs, forward/backward/lateral, x15 each direction with 3 sec holds  Incline plank against // bars for support, 30 sec holds with verbal cuing for good posture (keeping head upright, feet facing forward, hips tucked forward, and shoulders back), x3 attempts.   Standing pallof press, 7.5#, x10 each direction Standing hip hinge exercise with visual, verbal, and tactile cuing with use of PVC pipe to teach proper body mechanics, 2x10 Standing kettlebell deadlift from elevated surafe (6" step), 2x10 visual and verbal cuing given Supine LTR with use of green physioball, 2x10 Supine hamstring curls with use of green physioball, 2x10 Hooklying bridges, 2x10 Supine modified crunches into green physioball, 2-3 sec holds, 2x10     PATIENT EDUCATION: Education details: positional  testing and purpose, repositioning maneuvers, exercise technique, body mechanics, plan  Person educated: Patient Education method: Explanation, Tactile cues, and Verbal cues Education comprehension: verbalized understanding, returned demonstration, verbal cues required, and tactile cues required    HOME EXERCISE PROGRAM: Access Code: [email or web address] URL: [email or web address] Date: 04/04/2022 Prepared by: [place]  Exercises - Isometric Shoulder Flexion at Wall  - 1 x daily - 7 x weekly - 2 sets - 10 reps - 3 hold - Isometric Shoulder Extension at Wall  - 1 x daily - 7 x weekly - 2 sets - 10 reps - 3 hold - Isometric Shoulder Abduction at Wall  - 1 x daily - 7 x weekly - 2 sets - 10 reps - 3 hold - Isometric Shoulder Adduction  - 1 x daily - 7 x weekly - 2 sets - 10 reps - 3 hold    Plan - 02/26/22 0855     Clinical Impression Statement *** Pt responded well to all exercises given.  Pt  notes that she felt good stretches with the tasks given.  Pt encouraged to practice proper lifting mechanics over the weekend as she is preparing for Christmas activities, including baking and cooking in the oven.   Pt will continue to benefit from skilled therapy to address remaining deficits in order to improve overall QoL and return to PLOF.          Personal Factors and Comorbidities Comorbidity 3+;Age;Fitness;Past/Current Experience;Time since onset of injury/illness/exacerbation    Comorbidities memory impairment, anxiety, depression, DM, hx of kidney stones, obesity, RA    Examination-Activity Limitations Bed Mobility;Stand;Lift;Locomotion Level;Bend;Transfers;Carry;Continence;Sit;Sleep;Stairs;Squat    Examination-Participation Restrictions Church;Meal Prep;Cleaning;Community Activity;Driving;Interpersonal Relationship;Laundry;Yard Work    Merchant navy officer Evolving/Moderate complexity    Rehab Potential Poor    PT Frequency 2x / week    PT Duration 6 weeks    PT Treatment/Interventions Balance training;Neuromuscular re-education;Therapeutic activities;Patient/family education;Manual techniques;Therapeutic exercise;Electrical Stimulation;Cryotherapy;Moist Heat;Stair training;Functional mobility training;DME Instruction;Gait training;Passive range of motion;Dry needling    PT Next Visit Plan Continue with work toward goal achievement    PT Home Exercise Plan Access Code: [email or web address] URL: [email or web address] Date: 03/06/2022 Prepared by: Rebbeca Paul  Exercises - Supine Hip Adduction Isometric with Ball  - 1 x daily - 5 x weekly - 3 sets - 15 reps - 3 seconds hold - Sit to Stand with Counter Support  - 1 x daily - 5 x weekly - 3 sets - 10 reps - Seated Long Arc Quad  - 1 x daily - 5 x weekly - 3 sets - 10 reps - 3 hold - Side Stepping with Counter Support  - 1 x daily - 5 x weekly - 3 sets - 20 reps   Consulted and Agree with Plan of Care Patient                  PT Short Term Goals - TARGET DATE 04/29/2022        PT SHORT TERM GOAL #1   Title Pt will be independent with her initial HEP to decrease pain, improve strength, function, and ability to perform transfers more comfortably and with less difficulty.  03/11/22: limited by ST memory deficits;    Time 4   Period Weeks    Status Ongoing    Target Date       PT SHORT TERM GOAL #2   Title Pt will demonstrate improved tolerance to overground AMB by> 561f without pain limitation:    Baseline eval: <3014f 03/11/22: 45067fithout pain  exacerbation, no AD, then 729f c 4WW without pain exacerbation;  04/04/22: 1200 ft without pain   Time 4   Period Weeks    Status MET   Target Date       PT SHORT TERM GOAL #3   Title Pt to demonstrate improved tolerance to power activities AEB 5xSTS <15 secs    Baseline Eval: 19sec; 03/11/22: 11.45sec; 04/04/22: 11.63 sec   Time 4   Period Weeks    Status MET   Target Date 03/18/22                                     PT SHORT TERM GOAL #4  Title Pt will understand precautions, including post-maneuver precautions, and technique with canalith repositioning maneuver in order to reduce dizziness and decrease fall risk  Baseline 11/28: pt verbalized understanding for all, successfully completes maneuver in session with PT  Time 4  Period Weeks   Status MET  Target Date         PT Long Term Goals - TARGET DATE 05/13/2022          PT LONG TERM GOAL #1   Title Pt to improve score on FOTO survery by >8 points to indicate improved self efficicacy in ADL/IADL mobility.    Baseline Eval: 40; 04/04/22: 52   Time 6    Period Weeks    Status MET    Target Date 05/13/22     PT LONG TERM GOAL #2   Title Pt to demonstrate improved standing tolerance at home to >20 minutes.    Baseline eval: 5-10 minutes; 04/04/22: 30 minutes   Time 6    Period Weeks    Status New    Target Date 05/13/22     PT LONG TERM GOAL #3   Title Pt to  demonstrate tolerance to 6MWT without need to sit during testing, performance >10039f    Baseline eval: tolerates less than 2 minutes of walking.  03/11/22: c 4WW AMB 59m33m without pain exacerbation (750f10f12/1/23: c 4WW AMB 1207 ft without pain   Time 6    Period Weeks    Status MET    Target Date 05/13/21   PT LONG TERM GOAL #4  Title Pt to demonstrate tolerance to 6MWT without AD or need to sit during testing, performance >1000ft18fBaseline 04/04/22: Pt able to perform 1207 ft c 4WW   Time 4  Period Weeks   Status NEW   Target Date 05/13/21             JoshuGwenlyn Saran DPT Physical Therapist- [facility]19/23, 1:40 PM

## 2022-04-24 ENCOUNTER — Ambulatory Visit: Payer: Medicare HMO

## 2022-04-24 DIAGNOSIS — M6281 Muscle weakness (generalized): Secondary | ICD-10-CM

## 2022-04-24 DIAGNOSIS — G8929 Other chronic pain: Secondary | ICD-10-CM

## 2022-04-24 DIAGNOSIS — R42 Dizziness and giddiness: Secondary | ICD-10-CM | POA: Diagnosis not present

## 2022-04-24 NOTE — Therapy (Signed)
OUTPATIENT PHYSICAL THERAPY TREATMENT NOTE    Patient Name: Jessica Cole MRN: [id number] DOB:08/30/1950, 71 y.o., female Today's Date: 04/24/2022  PCP: Adin Hector, MD REFERRING PROVIDER: Eustace Moore, MD   PT End of Session - 04/24/22 1505     Visit Number 14    Number of Visits 21   *corrected to reflect recent recertification   Date for PT Re-Evaluation 05/13/22    Authorization Type [employer]    Authorization Time Period 02/24/22-04/07/22    Progress Note Due on Visit 10    PT Start Time 1029    PT Stop Time 1104    PT Time Calculation (min) 35 min    Equipment Utilized During Treatment Gait belt    Activity Tolerance Patient tolerated treatment well;No increased pain    Behavior During Therapy WFL for tasks assessed/performed              Past Medical History:  Diagnosis Date   Anxiety    Asthma    seasonal with allergies   Colon polyps    Degenerative arthritis    Depression    Diabetes mellitus without complication (HCC)    Type II   Environmental allergies    Family history of adverse reaction to anesthesia    sister- PONV   Fatty liver    Glaucoma    Headache(784.0)    HX  MIGRAINES   History of bronchitis    History of kidney stones    History of pneumonia    Hypertension    Hypothyroidism    Obesity    OSA on CPAP    Plantar fasciitis    right   PONV (postoperative nausea and vomiting)    pt has never had any anesthesia complications, no post-op nausea or vomiting   Renal calculi    Renal calculi    Rheumatoid arthritis (HCC)    RLS (restless legs syndrome) 08/23/2014     Past Surgical History:  Procedure Laterality Date   ABDOMINAL HYSTERECTOMY     partial   APPENDECTOMY     Arthroscopic surgery, knee Left    BREAST BIOPSY Right    several   BREAST BIOPSY Right 03/11/2017   u/s bx neg   BUNIONECTOMY     LEFT    COLONOSCOPY W/ POLYPECTOMY     COLONOSCOPY WITH PROPOFOL N/A 08/14/2017   Procedure: COLONOSCOPY  WITH PROPOFOL;  Surgeon: Lollie Sails, MD;  Location: [facility] ENDOSCOPY;  Service: Endoscopy;  Laterality: N/A;   EXTRACORPOREAL SHOCK WAVE LITHOTRIPSY Left 05/14/2017   Procedure: EXTRACORPOREAL SHOCK WAVE LITHOTRIPSY (ESWL);  Surgeon: Abbie Sons, MD;  Location: [facility] ORS;  Service: Urology;  Laterality: Left;   EXTRACORPOREAL SHOCK WAVE LITHOTRIPSY Left 06/14/2020   Procedure: EXTRACORPOREAL SHOCK WAVE LITHOTRIPSY (ESWL);  Surgeon: Billey Co, MD;  Location: [facility] ORS;  Service: Urology;  Laterality: Left;   FOOT SURGERY     RIGHT     KNEE ARTHROSCOPY W/ MENISCAL REPAIR Left    LASIK     LUMBAR LAMINECTOMY/DECOMPRESSION MICRODISCECTOMY Right 03/01/2021   Procedure: Microdiscectomy - Lumbar five-Sacral one - right;  Surgeon: Eustace Moore, MD;  Location: [place];  Service: Neurosurgery;  Laterality: Right;   MAXIMUM ACCESS (MAS)POSTERIOR LUMBAR INTERBODY FUSION (PLIF) 1 LEVEL N/A 04/25/2016   Procedure: LUMBAR FOUR-FIVE  MAXIMUM ACCESS (MAS) POSTERIOR LUMBAR INTERBODY FUSION (PLIF) with extension of instrumentation LUMBAR TWO-FIVE;  Surgeon: Eustace Moore, MD;  Location: [place];  Service: Neurosurgery;  Laterality: N/A;   MAXIMUM ACCESS (MAS)POSTERIOR LUMBAR INTERBODY FUSION (PLIF) 2 LEVEL N/A 12/13/2015   Procedure: Lumbar two-three - Lumbar three-four MAXIMUM ACCESS (MAS) POSTERIOR LUMBAR INTERBODY FUSION (PLIF)  ;  Surgeon: Eustace Moore, MD;  Location: [facility] NEURO ORS;  Service: Neurosurgery;  Laterality: N/A;   REVERSE SHOULDER ARTHROPLASTY Left 03/01/2020   Procedure: REVERSE SHOULDER ARTHROPLASTY;  Surgeon: Corky Mull, MD;  Location: [facility] ORS;  Service: Orthopedics;  Laterality: Left;   SHOULDER SURGERY     RIGHT    TONSILLECTOMY     Patient Active Problem List   Diagnosis Date Noted   S/P lumbar laminectomy 03/01/2021   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 edema associated with type 2 diabetes mellitus ([place]) 01/03/2019   Chronic cough  11/19/2016   Morbid obesity ([place]) 09/26/2016   Left ovarian cyst 05/30/2016   S/P lumbar spinal fusion 12/13/2015   Aortic calcification ([place]) 07/18/2015   Controlled type 2 diabetes mellitus without complication ([place]) 40/02/2724   Thrombocytopenia ([place]) 05/16/2015   Recurrent major depressive disorder, in full remission ([place]) 05/16/2015   Essential (primary) hypertension 05/16/2015   Fatty infiltration of liver 03/15/2015   Aortic valve stenosis, nonrheumatic 01/18/2015   Sciatica of right side 01/09/2015   Type 2 diabetes mellitus ([place]) 11/10/2014   RLS (restless legs syndrome) 08/23/2014   Neuritis or radiculitis due to rupture of lumbar intervertebral disc 06/13/2014   Degeneration of intervertebral disc of lumbar region 06/13/2014   Lumbar radiculitis 06/13/2014   Arthritis 01/23/2014   Arthritis of knee, degenerative 11/15/2013   Depression 09/29/2013   Insomnia 09/29/2013   Apnea, sleep 08/29/2013   History of nephrolithiasis 08/29/2013   Abnormal presence of protein in urine 08/29/2013   Headache, migraine 08/29/2013   BP (high blood pressure) 08/29/2013   HLD (hyperlipidemia) 08/29/2013   Allergic rhinitis 08/29/2013   Intractable migraine without aura 02/18/2013    REFERRING DIAG: Radiculopathy, Lumbosacral Region  THERAPY DIAG:   Chronic right-sided low back pain, unspecified whether sciatica present   Radiculopathy, lumbosacral region   Difficulty in walking, not elsewhere classified  Dizziness and giddiness  Muscle weakness (generalized)  Chronic bilateral low back pain, unspecified whether sciatica present  Rationale for Evaluation and Treatment Rehabilitation  PERTINENT HISTORY:  Pt reports chronic low back pain with radiation into the right hip and Rt posterior leg. She is a limited historian due to memory recall deficits. Lumbar surgery has been recommended, however insurance is mandating a 6 week trial of PT. Lumbar surgical history includes: PLIF  L4-5, L2-08 Apr 2016, L2/3 & L3/07 Dec 2015, Rt L5-S1 Microdiskectomy/laminectomy 03/01/21  PRECAUTIONS: Fall Risk   SUBJECTIVE:   SUBJECTIVE STATEMENT:  Pt reports she is not having a good day which is why she is late to session. She reports she recently saw physician due to difficulty waking up/excessive grogginess. Pt reports they found some abnormalities on her labs but "no major concerns." She has another upcoming follow-up with Dr. Caryl Comes where she plans to address this. Her back is currently feeling OK, but she still can wake up due to LBP.  She reports she continues to have "very slight" vertigo. She would like to reassess again and try another maneuver and reports she feels previous maneuvers have helped decrease her vertigo.   PAIN: Are you having pain? Yes: NPRS scale: 3-4/10 Pain location: R side low back Pain description: none     TODAY'S TREATMENT:  NMR:  Right Dix-Hallpike - delayed onset of  jerk nystagmus, rotational and downbeat to L; requires several minutes in position to fatigue. Pt also endorses dizziness/vertigo that improves after several minutes.  Left Dix-Hallpike - delayed onset jerk nystagmus (significantly weaker compared to R side) rotational and downbeat to R in appearance. Nystagmus is very subtle but still requires minutes to fatigue   With pt PMH, including migraines, and characteristics of nystagmus, PT does recommend at this time that pt follow up with neurologist regarding her vertigo. However, pt did provided head hang maneuver (supported by tilt table to limit cervical ROM required of pt) 1x on this date as pt reports she thinks previous maneuver did improve her dizziness/vertigo. Pt reported no dizziness following maneuver.   Reveiwed red flags to watch for and post maneuver-precautions  TherEx:  2.5# donned each LE, seated LAQ 2x10 each LE. Rates medium March 2x10 each LE. Rates medium  Seated DF 20x BLE  STS 2x6, 1x8.  Pt reports no increased  back pain at end of session or with exercises, and reports she continues to experience no dizziness at end of session.      PATIENT EDUCATION: Education details: positional testing and purpose, repositioning maneuvers, exercise technique, body mechanics, plan  Person educated: Patient Education method: Explanation, Tactile cues, and Verbal cues Education comprehension: verbalized understanding, returned demonstration, verbal cues required, and tactile cues required    HOME EXERCISE PROGRAM: Pt to continue HEP as previously given Access Code: [email or web address] URL: [email or web address] Date: 04/04/2022 Prepared by: [place]  Exercises - Isometric Shoulder Flexion at Wall  - 1 x daily - 7 x weekly - 2 sets - 10 reps - 3 hold - Isometric Shoulder Extension at Wall  - 1 x daily - 7 x weekly - 2 sets - 10 reps - 3 hold - Isometric Shoulder Abduction at Wall  - 1 x daily - 7 x weekly - 2 sets - 10 reps - 3 hold - Isometric Shoulder Adduction  - 1 x daily - 7 x weekly - 2 sets - 10 reps - 3 hold    Plan - 02/26/22 0855     Clinical Impression Statement  PT provides tilt-table supported, "deep head-hang maneuver" on this date. However, PT does recommend pt would likely benefit from follow-up with neuro due to nystagmus observed and if vertigo does not go away completely, or if it continues to return. Pt did report vertigo improvement following previous CRM so will continue to monitor. Pt reported no dizziness following CRM and also tolerated seated therex well this session. The pt will continue to benefit from skilled therapy to address remaining deficits in order to improve overall QoL and return to PLOF.          Personal Factors and Comorbidities Comorbidity 3+;Age;Fitness;Past/Current Experience;Time since onset of injury/illness/exacerbation    Comorbidities memory impairment, anxiety, depression, DM, hx of kidney stones, obesity, RA    Examination-Activity Limitations  Bed Mobility;Stand;Lift;Locomotion Level;Bend;Transfers;Carry;Continence;Sit;Sleep;Stairs;Squat    Examination-Participation Restrictions Church;Meal Prep;Cleaning;Community Activity;Driving;Interpersonal Relationship;Laundry;Yard Work    [employer] Evolving/Moderate complexity    Rehab Potential Poor    PT Frequency 2x / week    PT Duration 6 weeks    PT Treatment/Interventions Balance training;Neuromuscular re-education;Therapeutic activities;Patient/family education;Manual techniques;Therapeutic exercise;Electrical Stimulation;Cryotherapy;Moist Heat;Stair training;Functional mobility training;DME Instruction;Gait training;Passive range of motion;Dry needling    PT Next Visit Plan Continue with work toward goal achievement    PT Home Exercise Plan Access Code: [email or web address] URL: [email or web address] Date: 03/06/2022 Prepared by: Rebbeca Paul  Exercises - Supine Hip Adduction Isometric  with Ball  - 1 x daily - 5 x weekly - 3 sets - 15 reps - 3 seconds hold - Sit to Stand with Counter Support  - 1 x daily - 5 x weekly - 3 sets - 10 reps - Seated Long Arc Quad  - 1 x daily - 5 x weekly - 3 sets - 10 reps - 3 hold - Side Stepping with Counter Support  - 1 x daily - 5 x weekly - 3 sets - 20 reps   Consulted and Agree with Plan of Care Patient                 PT Short Term Goals - TARGET DATE 04/29/2022        PT SHORT TERM GOAL #1   Title Pt will be independent with her initial HEP to decrease pain, improve strength, function, and ability to perform transfers more comfortably and with less difficulty.  03/11/22: limited by ST memory deficits;    Time 4   Period Weeks    Status Ongoing    Target Date       PT SHORT TERM GOAL #2   Title Pt will demonstrate improved tolerance to overground AMB by> 522f without pain limitation:    Baseline eval: <3057f 03/11/22: 45081fithout pain exacerbation, no AD, then [id number] without pain exacerbation;   04/04/22: 1200 ft without pain   Time 4   Period Weeks    Status MET   Target Date       PT SHORT TERM GOAL #3   Title Pt to demonstrate improved tolerance to power activities AEB 5xSTS <15 secs    Baseline Eval: 19sec; 03/11/22: 11.45sec; 04/04/22: 11.63 sec   Time 4   Period Weeks    Status MET   Target Date 03/18/22                                     PT SHORT TERM GOAL #4  Title Pt will understand precautions, including post-maneuver precautions, and technique with canalith repositioning maneuver in order to reduce dizziness and decrease fall risk  Baseline 11/28: pt verbalized understanding for all, successfully completes maneuver in session with PT  Time 4  Period Weeks   Status MET  Target Date         PT Long Term Goals - TARGET DATE 05/13/2022          PT LONG TERM GOAL #1   Title Pt to improve score on FOTO survery by >8 points to indicate improved self efficicacy in ADL/IADL mobility.    Baseline Eval: 40; 04/04/22: 52   Time 6    Period Weeks    Status MET    Target Date 05/13/22     PT LONG TERM GOAL #2   Title Pt to demonstrate improved standing tolerance at home to >20 minutes.    Baseline eval: 5-10 minutes; 04/04/22: 30 minutes   Time 6    Period Weeks    Status New    Target Date 05/13/22     PT LONG TERM GOAL #3   Title Pt to demonstrate tolerance to 6MWT without need to sit during testing, performance >1000ft52f Baseline eval: tolerates less than 2 minutes of walking.  03/11/22: c 4WW AMB [id number] pain exacerbation (750ft) 59f1/23: c 4WW AMB 1207 ft without pain   Time 6  Period Weeks    Status MET    Target Date 05/13/21   PT LONG TERM GOAL #4  Title Pt to demonstrate tolerance to 6MWT without AD or need to sit during testing, performance >1011f.   Baseline 04/04/22: Pt able to perform 1207 ft c 4WW   Time 4  Period Weeks   Status NEW   Target Date 05/13/21             HRicard DillonPT, DPT  Physical Therapist- [facility] 04/24/22, 3:07 PM

## 2022-04-25 ENCOUNTER — Ambulatory Visit: Payer: Medicare HMO

## 2022-04-25 DIAGNOSIS — M25551 Pain in right hip: Secondary | ICD-10-CM

## 2022-04-25 DIAGNOSIS — R42 Dizziness and giddiness: Secondary | ICD-10-CM | POA: Diagnosis not present

## 2022-04-25 DIAGNOSIS — M5416 Radiculopathy, lumbar region: Secondary | ICD-10-CM

## 2022-04-25 DIAGNOSIS — R2681 Unsteadiness on feet: Secondary | ICD-10-CM

## 2022-04-25 DIAGNOSIS — R262 Difficulty in walking, not elsewhere classified: Secondary | ICD-10-CM

## 2022-04-25 DIAGNOSIS — M6281 Muscle weakness (generalized): Secondary | ICD-10-CM

## 2022-04-25 DIAGNOSIS — M545 Low back pain, unspecified: Secondary | ICD-10-CM

## 2022-04-25 DIAGNOSIS — M5417 Radiculopathy, lumbosacral region: Secondary | ICD-10-CM

## 2022-04-25 NOTE — Therapy (Signed)
OUTPATIENT PHYSICAL THERAPY TREATMENT NOTE    Patient Name: Jessica Cole MRN: [id number] DOB:20-May-1950, 71 y.o., female Today's Date: 04/25/2022  PCP: Adin Hector, MD REFERRING PROVIDER: Eustace Moore, MD   PT End of Session - 04/25/22 1023     Visit Number 15    Number of Visits 21   *corrected to reflect recent recertification   Date for PT Re-Evaluation 05/13/22    Authorization Type [employer]    Authorization Time Period 02/24/22-04/07/22    Progress Note Due on Visit 10    PT Start Time 1023    PT Stop Time 1100    PT Time Calculation (min) 37 min    Equipment Utilized During Treatment Gait belt    Activity Tolerance Patient tolerated treatment well;No increased pain    Behavior During Therapy WFL for tasks assessed/performed               Past Medical History:  Diagnosis Date   Anxiety    Asthma    seasonal with allergies   Colon polyps    Degenerative arthritis    Depression    Diabetes mellitus without complication (HCC)    Type II   Environmental allergies    Family history of adverse reaction to anesthesia    sister- PONV   Fatty liver    Glaucoma    Headache(784.0)    HX  MIGRAINES   History of bronchitis    History of kidney stones    History of pneumonia    Hypertension    Hypothyroidism    Obesity    OSA on CPAP    Plantar fasciitis    right   PONV (postoperative nausea and vomiting)    pt has never had any anesthesia complications, no post-op nausea or vomiting   Renal calculi    Renal calculi    Rheumatoid arthritis (HCC)    RLS (restless legs syndrome) 08/23/2014     Past Surgical History:  Procedure Laterality Date   ABDOMINAL HYSTERECTOMY     partial   APPENDECTOMY     Arthroscopic surgery, knee Left    BREAST BIOPSY Right    several   BREAST BIOPSY Right 03/11/2017   u/s bx neg   BUNIONECTOMY     LEFT    COLONOSCOPY W/ POLYPECTOMY     COLONOSCOPY WITH PROPOFOL N/A 08/14/2017   Procedure: COLONOSCOPY  WITH PROPOFOL;  Surgeon: Lollie Sails, MD;  Location: [facility] ENDOSCOPY;  Service: Endoscopy;  Laterality: N/A;   EXTRACORPOREAL SHOCK WAVE LITHOTRIPSY Left 05/14/2017   Procedure: EXTRACORPOREAL SHOCK WAVE LITHOTRIPSY (ESWL);  Surgeon: Abbie Sons, MD;  Location: [facility] ORS;  Service: Urology;  Laterality: Left;   EXTRACORPOREAL SHOCK WAVE LITHOTRIPSY Left 06/14/2020   Procedure: EXTRACORPOREAL SHOCK WAVE LITHOTRIPSY (ESWL);  Surgeon: Billey Co, MD;  Location: [facility] ORS;  Service: Urology;  Laterality: Left;   FOOT SURGERY     RIGHT     KNEE ARTHROSCOPY W/ MENISCAL REPAIR Left    LASIK     LUMBAR LAMINECTOMY/DECOMPRESSION MICRODISCECTOMY Right 03/01/2021   Procedure: Microdiscectomy - Lumbar five-Sacral one - right;  Surgeon: Eustace Moore, MD;  Location: [place];  Service: Neurosurgery;  Laterality: Right;   MAXIMUM ACCESS (MAS)POSTERIOR LUMBAR INTERBODY FUSION (PLIF) 1 LEVEL N/A 04/25/2016   Procedure: LUMBAR FOUR-FIVE  MAXIMUM ACCESS (MAS) POSTERIOR LUMBAR INTERBODY FUSION (PLIF) with extension of instrumentation LUMBAR TWO-FIVE;  Surgeon: Eustace Moore, MD;  Location: [place];  Service: Neurosurgery;  Laterality: N/A;   MAXIMUM ACCESS (MAS)POSTERIOR LUMBAR INTERBODY FUSION (PLIF) 2 LEVEL N/A 12/13/2015   Procedure: Lumbar two-three - Lumbar three-four MAXIMUM ACCESS (MAS) POSTERIOR LUMBAR INTERBODY FUSION (PLIF)  ;  Surgeon: Eustace Moore, MD;  Location: [facility] NEURO ORS;  Service: Neurosurgery;  Laterality: N/A;   REVERSE SHOULDER ARTHROPLASTY Left 03/01/2020   Procedure: REVERSE SHOULDER ARTHROPLASTY;  Surgeon: Corky Mull, MD;  Location: [facility] ORS;  Service: Orthopedics;  Laterality: Left;   SHOULDER SURGERY     RIGHT    TONSILLECTOMY     Patient Active Problem List   Diagnosis Date Noted   S/P lumbar laminectomy 03/01/2021   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 edema associated with type 2 diabetes mellitus ([place]) 01/03/2019   Chronic cough  11/19/2016   Morbid obesity ([place]) 09/26/2016   Left ovarian cyst 05/30/2016   S/P lumbar spinal fusion 12/13/2015   Aortic calcification ([place]) 07/18/2015   Controlled type 2 diabetes mellitus without complication ([place]) 83/15/1761   Thrombocytopenia ([place]) 05/16/2015   Recurrent major depressive disorder, in full remission ([place]) 05/16/2015   Essential (primary) hypertension 05/16/2015   Fatty infiltration of liver 03/15/2015   Aortic valve stenosis, nonrheumatic 01/18/2015   Sciatica of right side 01/09/2015   Type 2 diabetes mellitus ([place]) 11/10/2014   RLS (restless legs syndrome) 08/23/2014   Neuritis or radiculitis due to rupture of lumbar intervertebral disc 06/13/2014   Degeneration of intervertebral disc of lumbar region 06/13/2014   Lumbar radiculitis 06/13/2014   Arthritis 01/23/2014   Arthritis of knee, degenerative 11/15/2013   Depression 09/29/2013   Insomnia 09/29/2013   Apnea, sleep 08/29/2013   History of nephrolithiasis 08/29/2013   Abnormal presence of protein in urine 08/29/2013   Headache, migraine 08/29/2013   BP (high blood pressure) 08/29/2013   HLD (hyperlipidemia) 08/29/2013   Allergic rhinitis 08/29/2013   Intractable migraine without aura 02/18/2013    REFERRING DIAG: Radiculopathy, Lumbosacral Region  THERAPY DIAG:   Chronic right-sided low back pain, unspecified whether sciatica present   Radiculopathy, lumbosacral region   Difficulty in walking, not elsewhere classified  Dizziness and giddiness  Muscle weakness (generalized)  Chronic bilateral low back pain, unspecified whether sciatica present  Unsteadiness on feet  Difficulty in walking, not elsewhere classified  Chronic right-sided low back pain, unspecified whether sciatica present  Radiculopathy, lumbosacral region  Radiculopathy, lumbar region  Pain in right hip  Low back pain, unspecified back pain laterality, unspecified chronicity, unspecified whether sciatica  present  Rationale for Evaluation and Treatment Rehabilitation  PERTINENT HISTORY:  Pt reports chronic low back pain with radiation into the right hip and Rt posterior leg. She is a limited historian due to memory recall deficits. Lumbar surgery has been recommended, however insurance is mandating a 6 week trial of PT. Lumbar surgical history includes: PLIF L4-5, L2-08 Apr 2016, L2/3 & L3/07 Dec 2015, Rt L5-S1 Microdiskectomy/laminectomy 03/01/21  PRECAUTIONS: Fall Risk   SUBJECTIVE:   SUBJECTIVE STATEMENT:   Pt reports she went home yesterday and took half a pain pill due to hurting.  Pt notes she is having increased pain on the L side of the back and the R side a little.  She notes it's slightly better than it was earlier this morning.  Pt reports she had one tiny episode of dizziness when lying on her bed last night, but otherwise has been better.  PAIN: Are you having pain? Yes: NPRS scale: 4.5-5/10 Pain location: R side low back Pain description:  none     TODAY'S TREATMENT:   TherEx:  Seated LAQ, 3# AW donned, 2x10 Seated marching, 3# AW donned, 2x10 Seated heel raises, 3# AW donned, 2x10 Seated hamstring curls, BTB, 2x10   STS, 2x10 with airex pad placed under feet for increased hip flexion and difficulty    Neuro:  Obstacle course designed by therapist including step over half-foam rollers, cone taps, ambulation across red mat, forward weight shift on rocker board, walking on hedgehogs, step up onto 8" step/curb and step down onto airex pad.  Pt performed first bout with 3# AW donned.  Pt performed 2 laps and experienced an uncontrolled descent to the red mat due to tripping over the red mat.  Pt denies any injury at occurrence and continued with performance of balance training.  Pt continued to perform second bout of 2 laps on the obstacle course without 3# AW donned.     Pt reports the same amount of back pain upon leaving the clinic, and required a seated rest  break at the conclusion of therapy in order for dizziness to resolve.      PATIENT EDUCATION: Education details: positional testing and purpose, repositioning maneuvers, exercise technique, body mechanics, plan  Person educated: Patient Education method: Explanation, Tactile cues, and Verbal cues Education comprehension: verbalized understanding, returned demonstration, verbal cues required, and tactile cues required    HOME EXERCISE PROGRAM: Pt to continue HEP as previously given Access Code: [email or web address] URL: [email or web address] Date: 04/04/2022 Prepared by: [place]  Exercises - Isometric Shoulder Flexion at Wall  - 1 x daily - 7 x weekly - 2 sets - 10 reps - 3 hold - Isometric Shoulder Extension at Wall  - 1 x daily - 7 x weekly - 2 sets - 10 reps - 3 hold - Isometric Shoulder Abduction at Wall  - 1 x daily - 7 x weekly - 2 sets - 10 reps - 3 hold - Isometric Shoulder Adduction  - 1 x daily - 7 x weekly - 2 sets - 10 reps - 3 hold    Plan - 02/26/22 0855     Clinical Impression Statement  Pt treatment limited due to pt arriving late to the clinic.  Pt performed well with the therapeutic exercises and is making good improvement with the increased weight.  Pt then transitioned to balance training on obstacle course, and performed well until the second lap of the first bout.  Pt experienced an uncontrolled descent to the red mat.  Therapist attempted to assist with controlling the descent via use of gait belt and CGA.  Pt noted to not have any physical injuries and states she was fine, just embarrassed.  Pt elected to continue with therapy and continued with the obstacle course.  Pt tends to rush through obstacles which increases risk for fall and pt advised to slow down. Pt performed well with the final bought in which she slowed her cadence.   Pt will continue to benefit from skilled therapy to address remaining deficits in order to improve overall QoL and return to  PLOF.             Personal Factors and Comorbidities Comorbidity 3+;Age;Fitness;Past/Current Experience;Time since onset of injury/illness/exacerbation    Comorbidities memory impairment, anxiety, depression, DM, hx of kidney stones, obesity, RA    Examination-Activity Limitations Bed Mobility;Stand;Lift;Locomotion Level;Bend;Transfers;Carry;Continence;Sit;Sleep;Stairs;Squat    Examination-Participation Restrictions Church;Meal Prep;Cleaning;Community Activity;Driving;Interpersonal Relationship;Laundry;Yard Work    Stability/Clinical Decision Making Evolving/Moderate complexity    Rehab Potential Poor  PT Frequency 2x / week    PT Duration 6 weeks    PT Treatment/Interventions Balance training;Neuromuscular re-education;Therapeutic activities;Patient/family education;Manual techniques;Therapeutic exercise;Electrical Stimulation;Cryotherapy;Moist Heat;Stair training;Functional mobility training;DME Instruction;Gait training;Passive range of motion;Dry needling    PT Next Visit Plan Continue with work toward goal achievement    PT Home Exercise Plan Access Code: [email or web address] URL: [email or web address] Date: 03/06/2022 Prepared by: Rebbeca Paul  Exercises - Supine Hip Adduction Isometric with Ball  - 1 x daily - 5 x weekly - 3 sets - 15 reps - 3 seconds hold - Sit to Stand with Counter Support  - 1 x daily - 5 x weekly - 3 sets - 10 reps - Seated Long Arc Quad  - 1 x daily - 5 x weekly - 3 sets - 10 reps - 3 hold - Side Stepping with Counter Support  - 1 x daily - 5 x weekly - 3 sets - 20 reps   Consulted and Agree with Plan of Care Patient                 PT Short Term Goals - TARGET DATE 04/29/2022        PT SHORT TERM GOAL #1   Title Pt will be independent with her initial HEP to decrease pain, improve strength, function, and ability to perform transfers more comfortably and with less difficulty.  03/11/22: limited by ST memory deficits;    Time 4   Period  Weeks    Status Ongoing    Target Date       PT SHORT TERM GOAL #2   Title Pt will demonstrate improved tolerance to overground AMB by> 558f without pain limitation:    Baseline eval: <3039f 03/11/22: 45049fithout pain exacerbation, no AD, then [id number] without pain exacerbation;  04/04/22: 1200 ft without pain   Time 4   Period Weeks    Status MET   Target Date       PT SHORT TERM GOAL #3   Title Pt to demonstrate improved tolerance to power activities AEB 5xSTS <15 secs    Baseline Eval: 19sec; 03/11/22: 11.45sec; 04/04/22: 11.63 sec   Time 4   Period Weeks    Status MET   Target Date 03/18/22                                     PT SHORT TERM GOAL #4  Title Pt will understand precautions, including post-maneuver precautions, and technique with canalith repositioning maneuver in order to reduce dizziness and decrease fall risk  Baseline 11/28: pt verbalized understanding for all, successfully completes maneuver in session with PT  Time 4  Period Weeks   Status MET  Target Date         PT Long Term Goals - TARGET DATE 05/13/2022          PT LONG TERM GOAL #1   Title Pt to improve score on FOTO survery by >8 points to indicate improved self efficicacy in ADL/IADL mobility.    Baseline Eval: 40; 04/04/22: 52   Time 6    Period Weeks    Status MET    Target Date 05/13/22     PT LONG TERM GOAL #2   Title Pt to demonstrate improved standing tolerance at home to >20 minutes.    Baseline eval: 5-10 minutes; 04/04/22: 30 minutes   Time 6    Period Weeks  Status New    Target Date 05/13/22     PT LONG TERM GOAL #3   Title Pt to demonstrate tolerance to 6MWT without need to sit during testing, performance >1070f.    Baseline eval: tolerates less than 2 minutes of walking.  03/11/22: c 4WW AMB 449ms without pain exacerbation (7506f 04/04/22: c 4WW AMB 1207 ft without pain   Time 6    Period Weeks    Status MET    Target Date 05/13/21   PT LONG TERM GOAL #4   Title Pt to demonstrate tolerance to 6MWT without AD or need to sit during testing, performance >1000f44f Baseline 04/04/22: Pt able to perform 1207 ft c 4WW   Time 4  Period Weeks   Status NEW   Target Date 05/13/21              JoshGwenlyn Saran, DPT Physical Therapist- [facility]/22/23, 10:23 AM

## 2022-04-29 ENCOUNTER — Ambulatory Visit: Payer: Medicare HMO

## 2022-04-29 DIAGNOSIS — M545 Low back pain, unspecified: Secondary | ICD-10-CM

## 2022-04-29 DIAGNOSIS — R42 Dizziness and giddiness: Secondary | ICD-10-CM

## 2022-04-29 DIAGNOSIS — M6281 Muscle weakness (generalized): Secondary | ICD-10-CM

## 2022-04-29 NOTE — Therapy (Signed)
OUTPATIENT PHYSICAL THERAPY TREATMENT NOTE    Patient Name: Jessica Cole MRN: [id number] DOB:12/15/1950, 71 y.o., female Today's Date: 04/29/2022  PCP: Adin Hector, MD REFERRING PROVIDER: Eustace Moore, MD   PT End of Session - 04/29/22 1350     Visit Number 16    Number of Visits 21   *corrected to reflect recent recertification   Date for PT Re-Evaluation 05/13/22    Authorization Type [employer]    Authorization Time Period 02/24/22-04/07/22    Progress Note Due on Visit 10    PT Start Time 1305    PT Stop Time 1346    PT Time Calculation (min) 41 min    Equipment Utilized During Treatment Gait belt    Activity Tolerance Patient tolerated treatment well;Patient limited by pain    Behavior During Therapy WFL for tasks assessed/performed               Past Medical History:  Diagnosis Date   Anxiety    Asthma    seasonal with allergies   Colon polyps    Degenerative arthritis    Depression    Diabetes mellitus without complication ([place])    Type II   Environmental allergies    Family history of adverse reaction to anesthesia    sister- PONV   Fatty liver    Glaucoma    Headache(784.0)    HX  MIGRAINES   History of bronchitis    History of kidney stones    History of pneumonia    Hypertension    Hypothyroidism    Obesity    OSA on CPAP    Plantar fasciitis    right   PONV (postoperative nausea and vomiting)    pt has never had any anesthesia complications, no post-op nausea or vomiting   Renal calculi    Renal calculi    Rheumatoid arthritis (HCC)    RLS (restless legs syndrome) 08/23/2014     Past Surgical History:  Procedure Laterality Date   ABDOMINAL HYSTERECTOMY     partial   APPENDECTOMY     Arthroscopic surgery, knee Left    BREAST BIOPSY Right    several   BREAST BIOPSY Right 03/11/2017   u/s bx neg   BUNIONECTOMY     LEFT    COLONOSCOPY W/ POLYPECTOMY     COLONOSCOPY WITH PROPOFOL N/A 08/14/2017   Procedure:  COLONOSCOPY WITH PROPOFOL;  Surgeon: Lollie Sails, MD;  Location[facility] ENDOSCOPY;  Service: Endoscopy;  Laterality: N/A;   EXTRACORPOREAL SHOCK WAVE LITHOTRIPSY Left 05/14/2017   Procedure: EXTRACORPOREAL SHOCK WAVE LITHOTRIPSY (ESWL);  Surgeon: Abbie Sons, MD;  Location: [facility] ORS;  Service: Urology;  Laterality: Left;   EXTRACORPOREAL SHOCK WAVE LITHOTRIPSY Left 06/14/2020   Procedure: EXTRACORPOREAL SHOCK WAVE LITHOTRIPSY (ESWL);  Surgeon: Billey Co, MD;  Location: [facility] ORS;  Service: Urology;  Laterality: Left;   FOOT SURGERY     RIGHT     KNEE ARTHROSCOPY W/ MENISCAL REPAIR Left    LASIK     LUMBAR LAMINECTOMY/DECOMPRESSION MICRODISCECTOMY Right 03/01/2021   Procedure: Microdiscectomy - Lumbar five-Sacral one - right;  Surgeon: Eustace Moore, MD;  Location: [place];  Service: Neurosurgery;  Laterality: Right;   MAXIMUM ACCESS (MAS)POSTERIOR LUMBAR INTERBODY FUSION (PLIF) 1 LEVEL N/A 04/25/2016   Procedure: LUMBAR FOUR-FIVE  MAXIMUM ACCESS (MAS) POSTERIOR LUMBAR INTERBODY FUSION (PLIF) with extension of instrumentation LUMBAR TWO-FIVE;  Surgeon: Eustace Moore, MD;  Location: [place];  Service:  Neurosurgery;  Laterality: N/A;   MAXIMUM ACCESS (MAS)POSTERIOR LUMBAR INTERBODY FUSION (PLIF) 2 LEVEL N/A 12/13/2015   Procedure: Lumbar two-three - Lumbar three-four MAXIMUM ACCESS (MAS) POSTERIOR LUMBAR INTERBODY FUSION (PLIF)  ;  Surgeon: Eustace Moore, MD;  Location: [facility] NEURO ORS;  Service: Neurosurgery;  Laterality: N/A;   REVERSE SHOULDER ARTHROPLASTY Left 03/01/2020   Procedure: REVERSE SHOULDER ARTHROPLASTY;  Surgeon: Corky Mull, MD;  Location: [facility] ORS;  Service: Orthopedics;  Laterality: Left;   SHOULDER SURGERY     RIGHT    TONSILLECTOMY     Patient Active Problem List   Diagnosis Date Noted   S/P lumbar laminectomy 03/01/2021   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 edema associated with type 2 diabetes mellitus ([place]) 01/03/2019   Chronic  cough 11/19/2016   Morbid obesity ([place]) 09/26/2016   Left ovarian cyst 05/30/2016   S/P lumbar spinal fusion 12/13/2015   Aortic calcification ([place]) 07/18/2015   Controlled type 2 diabetes mellitus without complication ([place]) 07/68/0881   Thrombocytopenia ([place]) 05/16/2015   Recurrent major depressive disorder, in full remission ([place]) 05/16/2015   Essential (primary) hypertension 05/16/2015   Fatty infiltration of liver 03/15/2015   Aortic valve stenosis, nonrheumatic 01/18/2015   Sciatica of right side 01/09/2015   Type 2 diabetes mellitus ([place]) 11/10/2014   RLS (restless legs syndrome) 08/23/2014   Neuritis or radiculitis due to rupture of lumbar intervertebral disc 06/13/2014   Degeneration of intervertebral disc of lumbar region 06/13/2014   Lumbar radiculitis 06/13/2014   Arthritis 01/23/2014   Arthritis of knee, degenerative 11/15/2013   Depression 09/29/2013   Insomnia 09/29/2013   Apnea, sleep 08/29/2013   History of nephrolithiasis 08/29/2013   Abnormal presence of protein in urine 08/29/2013   Headache, migraine 08/29/2013   BP (high blood pressure) 08/29/2013   HLD (hyperlipidemia) 08/29/2013   Allergic rhinitis 08/29/2013   Intractable migraine without aura 02/18/2013    REFERRING DIAG: Radiculopathy, Lumbosacral Region  THERAPY DIAG:   Chronic right-sided low back pain, unspecified whether sciatica present   Radiculopathy, lumbosacral region   Difficulty in walking, not elsewhere classified  Muscle weakness (generalized)  Chronic bilateral low back pain, unspecified whether sciatica present  Dizziness and giddiness  Rationale for Evaluation and Treatment Rehabilitation  PERTINENT HISTORY:  Pt reports chronic low back pain with radiation into the right hip and Rt posterior leg. She is a limited historian due to memory recall deficits. Lumbar surgery has been recommended, however insurance is mandating a 6 week trial of PT. Lumbar surgical history includes:  PLIF L4-5, L2-08 Apr 2016, L2/3 & L3/07 Dec 2015, Rt L5-S1 Microdiskectomy/laminectomy 03/01/21  PRECAUTIONS: Fall Risk   SUBJECTIVE:   SUBJECTIVE STATEMENT:   Pt reports pain in her low back and down BLE is 5-6/10. Pt reports no stumbles/falls. Pt reports dizziness is better but still "very slight." She notices it when she gets out of bed. Sometimes when she is getting dressed she feels like she is losing her balance but is able to catch herself.  Pt has not been able to reach a neurologist. PT also suggests seeing an ENT might be a good next step too.  PAIN: Are you having pain? Yes: NPRS scale: 5-6/10 Pain location: low back, BLE Pain description: none     TODAY'S TREATMENT:   TherEx:  Seated LAQ, 4# AW donned, 1x8, 1x10 each LE  Seated marching, 4# AW donned, 2x10. Rates easy Seated heel raises, 4# AW donned, 2x15 B Seated hip IR  with 4# AW donned 1x10, 1x6 each LE. Reports does feel some strain on RLE but not "really hurting or bothering me"  Seated hip ER with 4# AW donned 2x10 each LE   Seated adductor squeezes with red p.ball 2x10x3 sec/rep Seated hamstring curls, BTB, 2x12  Seated stability ball rollouts FWD/BCKWD/LTL x multiple reps of each  STS, 1x10  On mat table: SLR 10x each LE, cuing to decrease ROM to reduce strain felt in back for 10 more reps per LE Glute bridge 1x10. Reports feeling some strain across low back. Exercise discontinued Glute set 10x. Continues to report slight strain, exercise discontinued  LTR 10x each LE.   PPT and TrA activation 10x   PPT and TrA activation with march for core strengthening 10x 2 sets 90:90 isometric hold with TrA activation 5x10 sec holds   Clamshells 2x15 each LE   Some dizziness upon sitting up that resolved <30 seconds  Reports no back pain at end of session but some soreness. She reports no dizziness and that she feels OK      PATIENT EDUCATION: Education details: Pt educated throughout session about  proper posture and technique with exercises. Improved exercise technique, movement at target joints, use of target muscles after min to mod verbal, visual, tactile cues.   Person educated: Patient Education method: Explanation, Tactile cues, and Verbal cues Education comprehension: verbalized understanding, returned demonstration, verbal cues required, and tactile cues required    HOME EXERCISE PROGRAM: Pt to continue HEP as previously given Access Code: [email or web address] URL: [email or web address] Date: 04/04/2022 Prepared by: [place]  Exercises - Isometric Shoulder Flexion at Wall  - 1 x daily - 7 x weekly - 2 sets - 10 reps - 3 hold - Isometric Shoulder Extension at Wall  - 1 x daily - 7 x weekly - 2 sets - 10 reps - 3 hold - Isometric Shoulder Abduction at Wall  - 1 x daily - 7 x weekly - 2 sets - 10 reps - 3 hold - Isometric Shoulder Adduction  - 1 x daily - 7 x weekly - 2 sets - 10 reps - 3 hold    Plan - 02/26/22 0855     Clinical Impression Statement  The pt able to advance interventions with level of resistance used and/or reps performed. She reported some discomfort and strain with a few interventions, but stated it was not painful. Exercise were then modified or discontinued to increase pt comfort. Pt will continue to benefit from skilled therapy to address remaining deficits in order to improve overall QoL and return to PLOF.             Personal Factors and Comorbidities Comorbidity 3+;Age;Fitness;Past/Current Experience;Time since onset of injury/illness/exacerbation    Comorbidities memory impairment, anxiety, depression, DM, hx of kidney stones, obesity, RA    Examination-Activity Limitations Bed Mobility;Stand;Lift;Locomotion Level;Bend;Transfers;Carry;Continence;Sit;Sleep;Stairs;Squat    Examination-Participation Restrictions Church;Meal Prep;Cleaning;Community Activity;Driving;Interpersonal Relationship;Laundry;Yard Work    Multimedia programmer Evolving/Moderate complexity    Rehab Potential Poor    PT Frequency 2x / week    PT Duration 6 weeks    PT Treatment/Interventions Balance training;Neuromuscular re-education;Therapeutic activities;Patient/family education;Manual techniques;Therapeutic exercise;Electrical Stimulation;Cryotherapy;Moist Heat;Stair training;Functional mobility training;DME Instruction;Gait training;Passive range of motion;Dry needling    PT Next Visit Plan Continue with work toward goal achievement    PT Home Exercise Plan Access Code: [email or web address] URL: [email or web address] Date: 03/06/2022 Prepared by: Rebbeca Paul  Exercises - Supine Hip Adduction Isometric with Ball  - 1 x daily - 5  x weekly - 3 sets - 15 reps - 3 seconds hold - Sit to Stand with Counter Support  - 1 x daily - 5 x weekly - 3 sets - 10 reps - Seated Long Arc Quad  - 1 x daily - 5 x weekly - 3 sets - 10 reps - 3 hold - Side Stepping with Counter Support  - 1 x daily - 5 x weekly - 3 sets - 20 reps   Consulted and Agree with Plan of Care Patient                 PT Short Term Goals - TARGET DATE 04/29/2022        PT SHORT TERM GOAL #1   Title Pt will be independent with her initial HEP to decrease pain, improve strength, function, and ability to perform transfers more comfortably and with less difficulty.  03/11/22: limited by ST memory deficits;    Time 4   Period Weeks    Status Ongoing    Target Date       PT SHORT TERM GOAL #2   Title Pt will demonstrate improved tolerance to overground AMB by> 539f without pain limitation:    Baseline eval: <3076f 03/11/22: 4504fithout pain exacerbation, no AD, then [id number] without pain exacerbation;  04/04/22: 1200 ft without pain   Time 4   Period Weeks    Status MET   Target Date       PT SHORT TERM GOAL #3   Title Pt to demonstrate improved tolerance to power activities AEB 5xSTS <15 secs    Baseline Eval: 19sec; 03/11/22: 11.45sec; 04/04/22: 11.63 sec    Time 4   Period Weeks    Status MET   Target Date 03/18/22                                     PT SHORT TERM GOAL #4  Title Pt will understand precautions, including post-maneuver precautions, and technique with canalith repositioning maneuver in order to reduce dizziness and decrease fall risk  Baseline 11/28: pt verbalized understanding for all, successfully completes maneuver in session with PT  Time 4  Period Weeks   Status MET  Target Date         PT Long Term Goals - TARGET DATE 05/13/2022          PT LONG TERM GOAL #1   Title Pt to improve score on FOTO survery by >8 points to indicate improved self efficicacy in ADL/IADL mobility.    Baseline Eval: 40; 04/04/22: 52   Time 6    Period Weeks    Status MET    Target Date 05/13/22     PT LONG TERM GOAL #2   Title Pt to demonstrate improved standing tolerance at home to >20 minutes.    Baseline eval: 5-10 minutes; 04/04/22: 30 minutes   Time 6    Period Weeks    Status New    Target Date 05/13/22     PT LONG TERM GOAL #3   Title Pt to demonstrate tolerance to 6MWT without need to sit during testing, performance >1000ft56f Baseline eval: tolerates less than 2 minutes of walking.  03/11/22: c 4WW AMB [id number] pain exacerbation (750ft) 67f1/23: c 4WW AMB 1207 ft without pain   Time 6    Period Weeks    Status MET  Target Date 05/13/21   PT LONG TERM GOAL #4  Title Pt to demonstrate tolerance to 6MWT without AD or need to sit during testing, performance >1077f.   Baseline 04/04/22: Pt able to perform 1207 ft c 4WW   Time 4  Period Weeks   Status NEW   Target Date 05/13/21              HRicard DillonPT, DPT  Physical Therapist- [facility] 04/29/22, 1:54 PM

## 2022-05-02 ENCOUNTER — Ambulatory Visit: Payer: Medicare HMO

## 2022-05-02 DIAGNOSIS — R42 Dizziness and giddiness: Secondary | ICD-10-CM

## 2022-05-02 DIAGNOSIS — M545 Low back pain, unspecified: Secondary | ICD-10-CM

## 2022-05-02 DIAGNOSIS — R2681 Unsteadiness on feet: Secondary | ICD-10-CM

## 2022-05-02 DIAGNOSIS — M5417 Radiculopathy, lumbosacral region: Secondary | ICD-10-CM

## 2022-05-02 DIAGNOSIS — M6281 Muscle weakness (generalized): Secondary | ICD-10-CM

## 2022-05-02 DIAGNOSIS — G8929 Other chronic pain: Secondary | ICD-10-CM

## 2022-05-02 DIAGNOSIS — M25551 Pain in right hip: Secondary | ICD-10-CM

## 2022-05-02 DIAGNOSIS — R262 Difficulty in walking, not elsewhere classified: Secondary | ICD-10-CM

## 2022-05-02 DIAGNOSIS — M5416 Radiculopathy, lumbar region: Secondary | ICD-10-CM

## 2022-05-02 NOTE — Therapy (Signed)
OUTPATIENT PHYSICAL THERAPY TREATMENT NOTE    Patient Name: Jessica Cole MRN: [id number] DOB:1950-07-19, 71 y.o., female Today's Date: 05/02/2022  PCP: Adin Hector, MD REFERRING PROVIDER: Eustace Moore, MD   PT End of Session - 05/02/22 0813     Visit Number 17    Number of Visits 21   *corrected to reflect recent recertification   Date for PT Re-Evaluation 05/13/22    Authorization Type [employer]    Authorization Time Period 02/24/22-04/07/22    Progress Note Due on Visit 10    PT Start Time 0813    PT Stop Time 0845    PT Time Calculation (min) 32 min    Equipment Utilized During Treatment Gait belt    Activity Tolerance Patient tolerated treatment well;Patient limited by pain    Behavior During Therapy WFL for tasks assessed/performed               Past Medical History:  Diagnosis Date   Anxiety    Asthma    seasonal with allergies   Colon polyps    Degenerative arthritis    Depression    Diabetes mellitus without complication ([place])    Type II   Environmental allergies    Family history of adverse reaction to anesthesia    sister- PONV   Fatty liver    Glaucoma    Headache(784.0)    HX  MIGRAINES   History of bronchitis    History of kidney stones    History of pneumonia    Hypertension    Hypothyroidism    Obesity    OSA on CPAP    Plantar fasciitis    right   PONV (postoperative nausea and vomiting)    pt has never had any anesthesia complications, no post-op nausea or vomiting   Renal calculi    Renal calculi    Rheumatoid arthritis (HCC)    RLS (restless legs syndrome) 08/23/2014     Past Surgical History:  Procedure Laterality Date   ABDOMINAL HYSTERECTOMY     partial   APPENDECTOMY     Arthroscopic surgery, knee Left    BREAST BIOPSY Right    several   BREAST BIOPSY Right 03/11/2017   u/s bx neg   BUNIONECTOMY     LEFT    COLONOSCOPY W/ POLYPECTOMY     COLONOSCOPY WITH PROPOFOL N/A 08/14/2017   Procedure:  COLONOSCOPY WITH PROPOFOL;  Surgeon: Lollie Sails, MD;  Location: [facility] ENDOSCOPY;  Service: Endoscopy;  Laterality: N/A;   EXTRACORPOREAL SHOCK WAVE LITHOTRIPSY Left 05/14/2017   Procedure: EXTRACORPOREAL SHOCK WAVE LITHOTRIPSY (ESWL);  Surgeon: Abbie Sons, MD;  Location: [facility] ORS;  Service: Urology;  Laterality: Left;   EXTRACORPOREAL SHOCK WAVE LITHOTRIPSY Left 06/14/2020   Procedure: EXTRACORPOREAL SHOCK WAVE LITHOTRIPSY (ESWL);  Surgeon: Billey Co, MD;  Location: [facility];  Service: Urology;  Laterality: Left;   FOOT SURGERY     RIGHT     KNEE ARTHROSCOPY W/ MENISCAL REPAIR Left    LASIK     LUMBAR LAMINECTOMY/DECOMPRESSION MICRODISCECTOMY Right 03/01/2021   Procedure: Microdiscectomy - Lumbar five-Sacral one - right;  Surgeon: Eustace Moore, MD;  Location: [place];  Service: Neurosurgery;  Laterality: Right;   MAXIMUM ACCESS (MAS)POSTERIOR LUMBAR INTERBODY FUSION (PLIF) 1 LEVEL N/A 04/25/2016   Procedure: LUMBAR FOUR-FIVE  MAXIMUM ACCESS (MAS) POSTERIOR LUMBAR INTERBODY FUSION (PLIF) with extension of instrumentation LUMBAR TWO-FIVE;  Surgeon: Eustace Moore, MD;  Location: [place];  Service:  Neurosurgery;  Laterality: N/A;   MAXIMUM ACCESS (MAS)POSTERIOR LUMBAR INTERBODY FUSION (PLIF) 2 LEVEL N/A 12/13/2015   Procedure: Lumbar two-three - Lumbar three-four MAXIMUM ACCESS (MAS) POSTERIOR LUMBAR INTERBODY FUSION (PLIF)  ;  Surgeon: Eustace Moore, MD;  Location: [facility];  Service: Neurosurgery;  Laterality: N/A;   REVERSE SHOULDER ARTHROPLASTY Left 03/01/2020   Procedure: REVERSE SHOULDER ARTHROPLASTY;  Surgeon: Corky Mull, MD;  Location: [facility] ORS;  Service: Orthopedics;  Laterality: Left;   SHOULDER SURGERY     RIGHT    TONSILLECTOMY     Patient Active Problem List   Diagnosis Date Noted   S/P lumbar laminectomy 03/01/2021   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 edema associated with type 2 diabetes mellitus ([place]) 01/03/2019   Chronic  cough 11/19/2016   Morbid obesity ([place]) 09/26/2016   Left ovarian cyst 05/30/2016   S/P lumbar spinal fusion 12/13/2015   Aortic calcification ([place]) 07/18/2015   Controlled type 2 diabetes mellitus without complication ([place]) 59/97/7414   Thrombocytopenia ([place]) 05/16/2015   Recurrent major depressive disorder, in full remission ([place]) 05/16/2015   Essential (primary) hypertension 05/16/2015   Fatty infiltration of liver 03/15/2015   Aortic valve stenosis, nonrheumatic 01/18/2015   Sciatica of right side 01/09/2015   Type 2 diabetes mellitus ([place]) 11/10/2014   RLS (restless legs syndrome) 08/23/2014   Neuritis or radiculitis due to rupture of lumbar intervertebral disc 06/13/2014   Degeneration of intervertebral disc of lumbar region 06/13/2014   Lumbar radiculitis 06/13/2014   Arthritis 01/23/2014   Arthritis of knee, degenerative 11/15/2013   Depression 09/29/2013   Insomnia 09/29/2013   Apnea, sleep 08/29/2013   History of nephrolithiasis 08/29/2013   Abnormal presence of protein in urine 08/29/2013   Headache, migraine 08/29/2013   BP (high blood pressure) 08/29/2013   HLD (hyperlipidemia) 08/29/2013   Allergic rhinitis 08/29/2013   Intractable migraine without aura 02/18/2013    REFERRING DIAG: Radiculopathy, Lumbosacral Region  THERAPY DIAG:   Chronic right-sided low back pain, unspecified whether sciatica present   Radiculopathy, lumbosacral region   Difficulty in walking, not elsewhere classified  Muscle weakness (generalized)  Chronic bilateral low back pain, unspecified whether sciatica present  Dizziness and giddiness  Unsteadiness on feet  Difficulty in walking, not elsewhere classified  Chronic right-sided low back pain, unspecified whether sciatica present  Radiculopathy, lumbosacral region  Radiculopathy, lumbar region  Pain in right hip  Low back pain, unspecified back pain laterality, unspecified chronicity, unspecified whether sciatica  present  Rationale for Evaluation and Treatment Rehabilitation  PERTINENT HISTORY:  Pt reports chronic low back pain with radiation into the right hip and Rt posterior leg. She is a limited historian due to memory recall deficits. Lumbar surgery has been recommended, however insurance is mandating a 6 week trial of PT. Lumbar surgical history includes: PLIF L4-5, L2-08 Apr 2016, L2/3 & L3/07 Dec 2015, Rt L5-S1 Microdiskectomy/laminectomy 03/01/21  PRECAUTIONS: Fall Risk   SUBJECTIVE:   SUBJECTIVE STATEMENT:   Pt states that she is tired and she doesn't know why because she hasn't done anything.  She states that she was experiencing radiating pain down the L LE last night and took a pain pill for the pain.  She states that might be why her pain level is still decreased this morning.  She notes 2/10 pain in the back..  She says the back has been doing good lately, just having pain in the L LE.   PAIN: Are you having pain?  Yes: NPRS scale: 2/10 Pain location: L LE Pain description: dull    TODAY'S TREATMENT:  TherEx:  On mat table:  Hooklying SLR 2x15 each LE Hooklying lower trunk rotation, 2x15 each side with verbal cuing necessary for discontinued ROM when feeling increased pain Hooklying glute set x10 Hooklying glute bridge x10 with some increased reported pain so discontinued Hooklying clamshells with BTB resistance, 2x15 each LE   Prone hip extension, 2x10 each LE Prone donkey kicks (bent knee hip extension), 2x10 each LE   No dizziness upon standing today.     PATIENT EDUCATION: Education details: Pt educated throughout session about proper posture and technique with exercises. Improved exercise technique, movement at target joints, use of target muscles after min to mod verbal, visual, tactile cues.   Person educated: Patient Education method: Explanation, Tactile cues, and Verbal cues Education comprehension: verbalized understanding, returned demonstration,  verbal cues required, and tactile cues required    HOME EXERCISE PROGRAM: Pt to continue HEP as previously given Access Code: [email or web address] URL: [email or web address] Date: 04/04/2022 Prepared by: [place]  Exercises - Isometric Shoulder Flexion at Wall  - 1 x daily - 7 x weekly - 2 sets - 10 reps - 3 hold - Isometric Shoulder Extension at Wall  - 1 x daily - 7 x weekly - 2 sets - 10 reps - 3 hold - Isometric Shoulder Abduction at Wall  - 1 x daily - 7 x weekly - 2 sets - 10 reps - 3 hold - Isometric Shoulder Adduction  - 1 x daily - 7 x weekly - 2 sets - 10 reps - 3 hold    Plan - 02/26/22 0855     Clinical Impression Statement  Pt has been performing well with the tasks, but continues to have some pain in the lumbar region while performing the exercises.  Pt requires consistent verbal cuing to reduce AROM when pain is present.  Pt at times, continues to perform through pain even with the verbal instruction to not do so. Pt is making good progress with strengthening goals and would benefit from continued extension based exercises to assist with spinal mobility without pain as well.   Pt will continue to benefit from skilled therapy to address remaining deficits in order to improve overall QoL and return to PLOF.    Treatment session limited due to pt arriving to the clinic late.          Personal Factors and Comorbidities Comorbidity 3+;Age;Fitness;Past/Current Experience;Time since onset of injury/illness/exacerbation    Comorbidities memory impairment, anxiety, depression, DM, hx of kidney stones, obesity, RA    Examination-Activity Limitations Bed Mobility;Stand;Lift;Locomotion Level;Bend;Transfers;Carry;Continence;Sit;Sleep;Stairs;Squat    Examination-Participation Restrictions Church;Meal Prep;Cleaning;Community Activity;Driving;Interpersonal Relationship;Laundry;Yard Work    [employer] Evolving/Moderate complexity    Rehab Potential Poor     PT Frequency 2x / week    PT Duration 6 weeks    PT Treatment/Interventions Balance training;Neuromuscular re-education;Therapeutic activities;Patient/family education;Manual techniques;Therapeutic exercise;Electrical Stimulation;Cryotherapy;Moist Heat;Stair training;Functional mobility training;DME Instruction;Gait training;Passive range of motion;Dry needling    PT Next Visit Plan Continue with work toward goal achievement    PT Home Exercise Plan Access Code: [email or web address] URL: [email or web address] Date: 03/06/2022 Prepared by: Rebbeca Paul  Exercises - Supine Hip Adduction Isometric with Ball  - 1 x daily - 5 x weekly - 3 sets - 15 reps - 3 seconds hold - Sit to Stand with Counter Support  - 1 x daily - 5 x weekly - 3 sets - 10 reps - Seated Long  Arc Quad  - 1 x daily - 5 x weekly - 3 sets - 10 reps - 3 hold - Side Stepping with Counter Support  - 1 x daily - 5 x weekly - 3 sets - 20 reps   Consulted and Agree with Plan of Care Patient                 PT Short Term Goals - TARGET DATE 04/29/2022        PT SHORT TERM GOAL #1   Title Pt will be independent with her initial HEP to decrease pain, improve strength, function, and ability to perform transfers more comfortably and with less difficulty.  03/11/22: limited by ST memory deficits;    Time 4   Period Weeks    Status Ongoing    Target Date       PT SHORT TERM GOAL #2   Title Pt will demonstrate improved tolerance to overground AMB by> 536f without pain limitation:    Baseline eval: <3023f 03/11/22: 45080fithout pain exacerbation, no AD, then [id number] without pain exacerbation;  04/04/22: 1200 ft without pain   Time 4   Period Weeks    Status MET   Target Date       PT SHORT TERM GOAL #3   Title Pt to demonstrate improved tolerance to power activities AEB 5xSTS <15 secs    Baseline Eval: 19sec; 03/11/22: 11.45sec; 04/04/22: 11.63 sec   Time 4   Period Weeks    Status MET   Target Date 03/18/22                                      PT SHORT TERM GOAL #4  Title Pt will understand precautions, including post-maneuver precautions, and technique with canalith repositioning maneuver in order to reduce dizziness and decrease fall risk  Baseline 11/28: pt verbalized understanding for all, successfully completes maneuver in session with PT  Time 4  Period Weeks   Status MET  Target Date         PT Long Term Goals - TARGET DATE 05/13/2022          PT LONG TERM GOAL #1   Title Pt to improve score on FOTO survery by >8 points to indicate improved self efficicacy in ADL/IADL mobility.    Baseline Eval: 40; 04/04/22: 52   Time 6    Period Weeks    Status MET    Target Date 05/13/22     PT LONG TERM GOAL #2   Title Pt to demonstrate improved standing tolerance at home to >20 minutes.    Baseline eval: 5-10 minutes; 04/04/22: 30 minutes   Time 6    Period Weeks    Status New    Target Date 05/13/22     PT LONG TERM GOAL #3   Title Pt to demonstrate tolerance to 6MWT without need to sit during testing, performance >1000ft85f Baseline eval: tolerates less than 2 minutes of walking.  03/11/22: c 4WW AMB [id number] pain exacerbation (750ft) 59f1/23: c 4WW AMB 1207 ft without pain   Time 6    Period Weeks    Status MET    Target Date 05/13/21   PT LONG TERM GOAL #4  Title Pt to demonstrate tolerance to 6MWT without AD or need to sit during testing, performance >1000ft.  16feline  04/04/22: Pt  able to perform 1207 ft c 4WW   Time 4  Period Weeks   Status NEW   Target Date 05/13/21              Gwenlyn Saran, PT, DPT Physical Therapist- [facility]  05/02/22, 11:26 AM

## 2022-05-05 NOTE — Therapy (Incomplete)
OUTPATIENT PHYSICAL THERAPY TREATMENT NOTE    Patient Name: Jessica Cole MRN: [id number] DOB:1951/03/09, 72 y.o., female Today's Date: 05/05/2022  PCP: Adin Hector, MD REFERRING PROVIDER: Eustace Moore, MD       Past Medical History:  Diagnosis Date   Anxiety    Asthma    seasonal with allergies   Colon polyps    Degenerative arthritis    Depression    Diabetes mellitus without complication ([place])    Type II   Environmental allergies    Family history of adverse reaction to anesthesia    sister- PONV   Fatty liver    Glaucoma    Headache(784.0)    HX  MIGRAINES   History of bronchitis    History of kidney stones    History of pneumonia    Hypertension    Hypothyroidism    Obesity    OSA on CPAP    Plantar fasciitis    right   PONV (postoperative nausea and vomiting)    pt has never had any anesthesia complications, no post-op nausea or vomiting   Renal calculi    Renal calculi    Rheumatoid arthritis (HCC)    RLS (restless legs syndrome) 08/23/2014     Past Surgical History:  Procedure Laterality Date   ABDOMINAL HYSTERECTOMY     partial   APPENDECTOMY     Arthroscopic surgery, knee Left    BREAST BIOPSY Right    several   BREAST BIOPSY Right 03/11/2017   u/s bx neg   BUNIONECTOMY     LEFT    COLONOSCOPY W/ POLYPECTOMY     COLONOSCOPY WITH PROPOFOL N/A 08/14/2017   Procedure: COLONOSCOPY WITH PROPOFOL;  Surgeon: Lollie Sails, MD;  Location: [facility] ENDOSCOPY;  Service: Endoscopy;  Laterality: N/A;   EXTRACORPOREAL SHOCK WAVE LITHOTRIPSY Left 05/14/2017   Procedure: EXTRACORPOREAL SHOCK WAVE LITHOTRIPSY (ESWL);  Surgeon: Abbie Sons, MD;  Location: [facility] ORS;  Service: Urology;  Laterality: Left;   EXTRACORPOREAL SHOCK WAVE LITHOTRIPSY Left 06/14/2020   Procedure: EXTRACORPOREAL SHOCK WAVE LITHOTRIPSY (ESWL);  Surgeon: Billey Co, MD;  Location: [facility] ORS;  Service: Urology;  Laterality: Left;   FOOT SURGERY     RIGHT     KNEE  ARTHROSCOPY W/ MENISCAL REPAIR Left    LASIK     LUMBAR LAMINECTOMY/DECOMPRESSION MICRODISCECTOMY Right 03/01/2021   Procedure: Microdiscectomy - Lumbar five-Sacral one - right;  Surgeon: Eustace Moore, MD;  Location: [place];  Service: Neurosurgery;  Laterality: Right;   MAXIMUM ACCESS (MAS)POSTERIOR LUMBAR INTERBODY FUSION (PLIF) 1 LEVEL N/A 04/25/2016   Procedure: LUMBAR FOUR-FIVE  MAXIMUM ACCESS (MAS) POSTERIOR LUMBAR INTERBODY FUSION (PLIF) with extension of instrumentation LUMBAR TWO-FIVE;  Surgeon: Eustace Moore, MD;  Location: [place];  Service: Neurosurgery;  Laterality: N/A;   MAXIMUM ACCESS (MAS)POSTERIOR LUMBAR INTERBODY FUSION (PLIF) 2 LEVEL N/A 12/13/2015   Procedure: Lumbar two-three - Lumbar three-four MAXIMUM ACCESS (MAS) POSTERIOR LUMBAR INTERBODY FUSION (PLIF)  ;  Surgeon: Eustace Moore, MD;  Location: [facility] NEURO ORS;  Service: Neurosurgery;  Laterality: N/A;   REVERSE SHOULDER ARTHROPLASTY Left 03/01/2020   Procedure: REVERSE SHOULDER ARTHROPLASTY;  Surgeon: Corky Mull, MD;  Location: [facility] ORS;  Service: Orthopedics;  Laterality: Left;   SHOULDER SURGERY     RIGHT    TONSILLECTOMY     Patient Active Problem List   Diagnosis Date Noted   S/P lumbar laminectomy 03/01/2021   Mild nonproliferative diabetic retinopathy of both eyes  without macular edema associated with type 2 diabetes mellitus ([place]) 01/03/2019   Chronic cough 11/19/2016   Morbid obesity ([place]) 09/26/2016   Left ovarian cyst 05/30/2016   S/P lumbar spinal fusion 12/13/2015   Aortic calcification ([place]) 07/18/2015   Controlled type 2 diabetes mellitus without complication ([place]) 34/91/7915   Thrombocytopenia ([place]) 05/16/2015   Recurrent major depressive disorder, in full remission ([place]) 05/16/2015   Essential (primary) hypertension 05/16/2015   Fatty infiltration of liver 03/15/2015   Aortic valve stenosis, nonrheumatic 01/18/2015   Sciatica of right side 01/09/2015   Type 2 diabetes mellitus ([place]) 11/10/2014    RLS (restless legs syndrome) 08/23/2014   Neuritis or radiculitis due to rupture of lumbar intervertebral disc 06/13/2014   Degeneration of intervertebral disc of lumbar region 06/13/2014   Lumbar radiculitis 06/13/2014   Arthritis 01/23/2014   Arthritis of knee, degenerative 11/15/2013   Depression 09/29/2013   Insomnia 09/29/2013   Apnea, sleep 08/29/2013   History of nephrolithiasis 08/29/2013   Abnormal presence of protein in urine 08/29/2013   Headache, migraine 08/29/2013   BP (high blood pressure) 08/29/2013   HLD (hyperlipidemia) 08/29/2013   Allergic rhinitis 08/29/2013   Intractable migraine without aura 02/18/2013    REFERRING DIAG: Radiculopathy, Lumbosacral Region  THERAPY DIAG:   Chronic right-sided low back pain, unspecified whether sciatica present   Radiculopathy, lumbosacral region   Difficulty in walking, not elsewhere classified  No diagnosis found.  Rationale for Evaluation and Treatment Rehabilitation  PERTINENT HISTORY:  Pt reports chronic low back pain with radiation into the right hip and Rt posterior leg. She is a limited historian due to memory recall deficits. Lumbar surgery has been recommended, however insurance is mandating a 6 week trial of PT. Lumbar surgical history includes: PLIF L4-5, L2-08 Apr 2016, L2/3 & L3/07 Dec 2015, Rt L5-S1 Microdiskectomy/laminectomy 03/01/21  PRECAUTIONS: Fall Risk   SUBJECTIVE:   SUBJECTIVE STATEMENT:  *** Pt states that she is tired and she doesn't know why because she hasn't done anything.  She states that she was experiencing radiating pain down the L LE last night and took a pain pill for the pain.  She states that might be why her pain level is still decreased this morning.  She notes 2/10 pain in the back..  She says the back has been doing good lately, just having pain in the L LE.   PAIN: Are you having pain? Yes: NPRS scale: 2/10 Pain location: L LE Pain description: dull    TODAY'S  TREATMENT: ***  TherEx:  On mat table:  Hooklying SLR 2x15 each LE Hooklying lower trunk rotation, 2x15 each side with verbal cuing necessary for discontinued ROM when feeling increased pain Hooklying glute set x10 Hooklying glute bridge x10 with some increased reported pain so discontinued Hooklying clamshells with BTB resistance, 2x15 each LE   Prone hip extension, 2x10 each LE Prone donkey kicks (bent knee hip extension), 2x10 each LE   No dizziness upon standing today.     PATIENT EDUCATION: Education details: Pt educated throughout session about proper posture and technique with exercises. Improved exercise technique, movement at target joints, use of target muscles after min to mod verbal, visual, tactile cues.   Person educated: Patient Education method: Explanation, Tactile cues, and Verbal cues Education comprehension: verbalized understanding, returned demonstration, verbal cues required, and tactile cues required    HOME EXERCISE PROGRAM: Pt to continue HEP as previously given Access Code: [email or web address] URL: [email or web address] Date: 04/04/2022 Prepared by:  [place]  Exercises - Isometric Shoulder Flexion at Wall  - 1 x daily - 7 x weekly - 2 sets - 10 reps - 3 hold - Isometric Shoulder Extension at Wall  - 1 x daily - 7 x weekly - 2 sets - 10 reps - 3 hold - Isometric Shoulder Abduction at Wall  - 1 x daily - 7 x weekly - 2 sets - 10 reps - 3 hold - Isometric Shoulder Adduction  - 1 x daily - 7 x weekly - 2 sets - 10 reps - 3 hold    Plan - 02/26/22 0855     Clinical Impression Statement  *** Pt has been performing well with the tasks, but continues to have some pain in the lumbar region while performing the exercises.  Pt requires consistent verbal cuing to reduce AROM when pain is present.  Pt at times, continues to perform through pain even with the verbal instruction to not do so. Pt is making good progress with strengthening goals and  would benefit from continued extension based exercises to assist with spinal mobility without pain as well.   Pt will continue to benefit from skilled therapy to address remaining deficits in order to improve overall QoL and return to PLOF.    Treatment session limited due to pt arriving to the clinic late.          Personal Factors and Comorbidities Comorbidity 3+;Age;Fitness;Past/Current Experience;Time since onset of injury/illness/exacerbation    Comorbidities memory impairment, anxiety, depression, DM, hx of kidney stones, obesity, RA    Examination-Activity Limitations Bed Mobility;Stand;Lift;Locomotion Level;Bend;Transfers;Carry;Continence;Sit;Sleep;Stairs;Squat    Examination-Participation Restrictions Church;Meal Prep;Cleaning;Community Activity;Driving;Interpersonal Relationship;Laundry;Yard Work    [employer] Evolving/Moderate complexity    Rehab Potential Poor    PT Frequency 2x / week    PT Duration 6 weeks    PT Treatment/Interventions Balance training;Neuromuscular re-education;Therapeutic activities;Patient/family education;Manual techniques;Therapeutic exercise;Electrical Stimulation;Cryotherapy;Moist Heat;Stair training;Functional mobility training;DME Instruction;Gait training;Passive range of motion;Dry needling    PT Next Visit Plan Continue with work toward goal achievement    PT Home Exercise Plan Access Code: [email or web address] URL: [email or web address] Date: 03/06/2022 Prepared by: Rebbeca Paul  Exercises - Supine Hip Adduction Isometric with Ball  - 1 x daily - 5 x weekly - 3 sets - 15 reps - 3 seconds hold - Sit to Stand with Counter Support  - 1 x daily - 5 x weekly - 3 sets - 10 reps - Seated Long Arc Quad  - 1 x daily - 5 x weekly - 3 sets - 10 reps - 3 hold - Side Stepping with Counter Support  - 1 x daily - 5 x weekly - 3 sets - 20 reps   Consulted and Agree with Plan of Care Patient                 PT Short Term  Goals - TARGET DATE 04/29/2022        PT SHORT TERM GOAL #1   Title Pt will be independent with her initial HEP to decrease pain, improve strength, function, and ability to perform transfers more comfortably and with less difficulty.  03/11/22: limited by ST memory deficits;    Time 4   Period Weeks    Status Ongoing    Target Date       PT SHORT TERM GOAL #2   Title Pt will demonstrate improved tolerance to overground AMB by> 569f without pain limitation:    Baseline eval: <3077f 03/11/22: 45037fithout pain exacerbation, no AD, then  89f c 4WW without pain exacerbation;  04/04/22: 1200 ft without pain   Time 4   Period Weeks    Status MET   Target Date       PT SHORT TERM GOAL #3   Title Pt to demonstrate improved tolerance to power activities AEB 5xSTS <15 secs    Baseline Eval: 19sec; 03/11/22: 11.45sec; 04/04/22: 11.63 sec   Time 4   Period Weeks    Status MET   Target Date 03/18/22                                     PT SHORT TERM GOAL #4  Title Pt will understand precautions, including post-maneuver precautions, and technique with canalith repositioning maneuver in order to reduce dizziness and decrease fall risk  Baseline 11/28: pt verbalized understanding for all, successfully completes maneuver in session with PT  Time 4  Period Weeks   Status MET  Target Date         PT Long Term Goals - TARGET DATE 05/13/2022          PT LONG TERM GOAL #1   Title Pt to improve score on FOTO survery by >8 points to indicate improved self efficicacy in ADL/IADL mobility.    Baseline Eval: 40; 04/04/22: 52   Time 6    Period Weeks    Status MET    Target Date 05/13/22     PT LONG TERM GOAL #2   Title Pt to demonstrate improved standing tolerance at home to >20 minutes.    Baseline eval: 5-10 minutes; 04/04/22: 30 minutes   Time 6    Period Weeks    Status New    Target Date 05/13/22     PT LONG TERM GOAL #3   Title Pt to demonstrate tolerance to 6MWT without  need to sit during testing, performance >10074f    Baseline eval: tolerates less than 2 minutes of walking.  03/11/22: c 4WW AMB 32m61m without pain exacerbation (750f37f12/1/23: c 4WW AMB 1207 ft without pain   Time 6    Period Weeks    Status MET    Target Date 05/13/21   PT LONG TERM GOAL #4  Title Pt to demonstrate tolerance to 6MWT without AD or need to sit during testing, performance >1000ft33fBaseline  04/04/22: Pt able to perform 1207 ft c 4WW   Time 4  Period Weeks   Status NEW   Target Date 05/13/21              JoshuGwenlyn Saran DPT Physical Therapist- [facility]01/24, 10:20 PM

## 2022-05-06 ENCOUNTER — Ambulatory Visit: Payer: Medicare HMO

## 2022-05-07 NOTE — Therapy (Incomplete)
OUTPATIENT PHYSICAL THERAPY TREATMENT NOTE    Patient Name: Jessica Cole MRN: [id number] DOB:1950/11/16, 72 y.o., female Today's Date: 05/07/2022  PCP: Adin Hector, MD REFERRING PROVIDER: Eustace Moore, MD       Past Medical History:  Diagnosis Date   Anxiety    Asthma    seasonal with allergies   Colon polyps    Degenerative arthritis    Depression    Diabetes mellitus without complication ([place])    Type II   Environmental allergies    Family history of adverse reaction to anesthesia    sister- PONV   Fatty liver    Glaucoma    Headache(784.0)    HX  MIGRAINES   History of bronchitis    History of kidney stones    History of pneumonia    Hypertension    Hypothyroidism    Obesity    OSA on CPAP    Plantar fasciitis    right   PONV (postoperative nausea and vomiting)    pt has never had any anesthesia complications, no post-op nausea or vomiting   Renal calculi    Renal calculi    Rheumatoid arthritis (HCC)    RLS (restless legs syndrome) 08/23/2014     Past Surgical History:  Procedure Laterality Date   ABDOMINAL HYSTERECTOMY     partial   APPENDECTOMY     Arthroscopic surgery, knee Left    BREAST BIOPSY Right    several   BREAST BIOPSY Right 03/11/2017   u/s bx neg   BUNIONECTOMY     LEFT    COLONOSCOPY W/ POLYPECTOMY     COLONOSCOPY WITH PROPOFOL N/A 08/14/2017   Procedure: COLONOSCOPY WITH PROPOFOL;  Surgeon: Lollie Sails, MD;  Location: [facility] ENDOSCOPY;  Service: Endoscopy;  Laterality: N/A;   EXTRACORPOREAL SHOCK WAVE LITHOTRIPSY Left 05/14/2017   Procedure: EXTRACORPOREAL SHOCK WAVE LITHOTRIPSY (ESWL);  Surgeon: Abbie Sons, MD;  Location: [facility] ORS;  Service: Urology;  Laterality: Left;   EXTRACORPOREAL SHOCK WAVE LITHOTRIPSY Left 06/14/2020   Procedure: EXTRACORPOREAL SHOCK WAVE LITHOTRIPSY (ESWL);  Surgeon: Billey Co, MD;  Location: [facility] ORS;  Service: Urology;  Laterality: Left;   FOOT SURGERY     RIGHT     KNEE  ARTHROSCOPY W/ MENISCAL REPAIR Left    LASIK     LUMBAR LAMINECTOMY/DECOMPRESSION MICRODISCECTOMY Right 03/01/2021   Procedure: Microdiscectomy - Lumbar five-Sacral one - right;  Surgeon: Eustace Moore, MD;  Location: [place];  Service: Neurosurgery;  Laterality: Right;   MAXIMUM ACCESS (MAS)POSTERIOR LUMBAR INTERBODY FUSION (PLIF) 1 LEVEL N/A 04/25/2016   Procedure: LUMBAR FOUR-FIVE  MAXIMUM ACCESS (MAS) POSTERIOR LUMBAR INTERBODY FUSION (PLIF) with extension of instrumentation LUMBAR TWO-FIVE;  Surgeon: Eustace Moore, MD;  Location: [place];  Service: Neurosurgery;  Laterality: N/A;   MAXIMUM ACCESS (MAS)POSTERIOR LUMBAR INTERBODY FUSION (PLIF) 2 LEVEL N/A 12/13/2015   Procedure: Lumbar two-three - Lumbar three-four MAXIMUM ACCESS (MAS) POSTERIOR LUMBAR INTERBODY FUSION (PLIF)  ;  Surgeon: Eustace Moore, MD;  Location: [facility] NEURO ORS;  Service: Neurosurgery;  Laterality: N/A;   REVERSE SHOULDER ARTHROPLASTY Left 03/01/2020   Procedure: REVERSE SHOULDER ARTHROPLASTY;  Surgeon: Corky Mull, MD;  Location: [facility] ORS;  Service: Orthopedics;  Laterality: Left;   SHOULDER SURGERY     RIGHT    TONSILLECTOMY     Patient Active Problem List   Diagnosis Date Noted   S/P lumbar laminectomy 03/01/2021   Mild nonproliferative diabetic retinopathy of both eyes  without macular edema associated with type 2 diabetes mellitus ([place]) 01/03/2019   Chronic cough 11/19/2016   Morbid obesity ([place]) 09/26/2016   Left ovarian cyst 05/30/2016   S/P lumbar spinal fusion 12/13/2015   Aortic calcification ([place]) 07/18/2015   Controlled type 2 diabetes mellitus without complication ([place]) 62/94/7654   Thrombocytopenia ([place]) 05/16/2015   Recurrent major depressive disorder, in full remission ([place]) 05/16/2015   Essential (primary) hypertension 05/16/2015   Fatty infiltration of liver 03/15/2015   Aortic valve stenosis, nonrheumatic 01/18/2015   Sciatica of right side 01/09/2015   Type 2 diabetes mellitus ([place]) 11/10/2014    RLS (restless legs syndrome) 08/23/2014   Neuritis or radiculitis due to rupture of lumbar intervertebral disc 06/13/2014   Degeneration of intervertebral disc of lumbar region 06/13/2014   Lumbar radiculitis 06/13/2014   Arthritis 01/23/2014   Arthritis of knee, degenerative 11/15/2013   Depression 09/29/2013   Insomnia 09/29/2013   Apnea, sleep 08/29/2013   History of nephrolithiasis 08/29/2013   Abnormal presence of protein in urine 08/29/2013   Headache, migraine 08/29/2013   BP (high blood pressure) 08/29/2013   HLD (hyperlipidemia) 08/29/2013   Allergic rhinitis 08/29/2013   Intractable migraine without aura 02/18/2013    REFERRING DIAG: Radiculopathy, Lumbosacral Region  THERAPY DIAG:   Chronic right-sided low back pain, unspecified whether sciatica present   Radiculopathy, lumbosacral region   Difficulty in walking, not elsewhere classified  No diagnosis found.  Rationale for Evaluation and Treatment Rehabilitation  PERTINENT HISTORY:  Pt reports chronic low back pain with radiation into the right hip and Rt posterior leg. She is a limited historian due to memory recall deficits. Lumbar surgery has been recommended, however insurance is mandating a 6 week trial of PT. Lumbar surgical history includes: PLIF L4-5, L2-08 Apr 2016, L2/3 & L3/07 Dec 2015, Rt L5-S1 Microdiskectomy/laminectomy 03/01/21  PRECAUTIONS: Fall Risk   SUBJECTIVE:   SUBJECTIVE STATEMENT:  *** Pt states that she is tired and she doesn't know why because she hasn't done anything.  She states that she was experiencing radiating pain down the L LE last night and took a pain pill for the pain.  She states that might be why her pain level is still decreased this morning.  She notes 2/10 pain in the back..  She says the back has been doing good lately, just having pain in the L LE.   PAIN: Are you having pain? Yes: NPRS scale: 2/10 Pain location: L LE Pain description: dull    TODAY'S  TREATMENT: ***  TherEx:  On mat table:  Hooklying SLR 2x15 each LE Hooklying lower trunk rotation, 2x15 each side with verbal cuing necessary for discontinued ROM when feeling increased pain Hooklying glute set x10 Hooklying glute bridge x10 with some increased reported pain so discontinued Hooklying clamshells with BTB resistance, 2x15 each LE   Prone hip extension, 2x10 each LE Prone donkey kicks (bent knee hip extension), 2x10 each LE   No dizziness upon standing today.     PATIENT EDUCATION: Education details: Pt educated throughout session about proper posture and technique with exercises. Improved exercise technique, movement at target joints, use of target muscles after min to mod verbal, visual, tactile cues.   Person educated: Patient Education method: Explanation, Tactile cues, and Verbal cues Education comprehension: verbalized understanding, returned demonstration, verbal cues required, and tactile cues required    HOME EXERCISE PROGRAM: Pt to continue HEP as previously given Access Code: [email or web address] URL: [email or web address] Date: 04/04/2022 Prepared by: Calpella Nation  Exercises - Isometric Shoulder Flexion at Wall  - 1 x daily - 7 x weekly - 2 sets - 10 reps - 3 hold - Isometric Shoulder Extension at Wall  - 1 x daily - 7 x weekly - 2 sets - 10 reps - 3 hold - Isometric Shoulder Abduction at Wall  - 1 x daily - 7 x weekly - 2 sets - 10 reps - 3 hold - Isometric Shoulder Adduction  - 1 x daily - 7 x weekly - 2 sets - 10 reps - 3 hold    Plan - 02/26/22 0855     Clinical Impression Statement  *** Pt has been performing well with the tasks, but continues to have some pain in the lumbar region while performing the exercises.  Pt requires consistent verbal cuing to reduce AROM when pain is present.  Pt at times, continues to perform through pain even with the verbal instruction to not do so. Pt is making good progress with strengthening goals and  would benefit from continued extension based exercises to assist with spinal mobility without pain as well.   Pt will continue to benefit from skilled therapy to address remaining deficits in order to improve overall QoL and return to PLOF.    Treatment session limited due to pt arriving to the clinic late.          Personal Factors and Comorbidities Comorbidity 3+;Age;Fitness;Past/Current Experience;Time since onset of injury/illness/exacerbation    Comorbidities memory impairment, anxiety, depression, DM, hx of kidney stones, obesity, RA    Examination-Activity Limitations Bed Mobility;Stand;Lift;Locomotion Level;Bend;Transfers;Carry;Continence;Sit;Sleep;Stairs;Squat    Examination-Participation Restrictions Church;Meal Prep;Cleaning;Community Activity;Driving;Interpersonal Relationship;Laundry;Yard Work    [employer] Evolving/Moderate complexity    Rehab Potential Poor    PT Frequency 2x / week    PT Duration 6 weeks    PT Treatment/Interventions Balance training;Neuromuscular re-education;Therapeutic activities;Patient/family education;Manual techniques;Therapeutic exercise;Electrical Stimulation;Cryotherapy;Moist Heat;Stair training;Functional mobility training;DME Instruction;Gait training;Passive range of motion;Dry needling    PT Next Visit Plan Continue with work toward goal achievement    PT Home Exercise Plan Access Code: [email or web address] URL: [email or web address] Date: 03/06/2022 Prepared by: Rebbeca Paul  Exercises - Supine Hip Adduction Isometric with Ball  - 1 x daily - 5 x weekly - 3 sets - 15 reps - 3 seconds hold - Sit to Stand with Counter Support  - 1 x daily - 5 x weekly - 3 sets - 10 reps - Seated Long Arc Quad  - 1 x daily - 5 x weekly - 3 sets - 10 reps - 3 hold - Side Stepping with Counter Support  - 1 x daily - 5 x weekly - 3 sets - 20 reps   Consulted and Agree with Plan of Care Patient                 PT Short Term  Goals - TARGET DATE 04/29/2022        PT SHORT TERM GOAL #1   Title Pt will be independent with her initial HEP to decrease pain, improve strength, function, and ability to perform transfers more comfortably and with less difficulty.  03/11/22: limited by ST memory deficits;    Time 4   Period Weeks    Status Ongoing    Target Date       PT SHORT TERM GOAL #2   Title Pt will demonstrate improved tolerance to overground AMB by> 560f without pain limitation:    Baseline eval: <3020f 03/11/22: 45049fithout pain exacerbation, no AD, then  732f c 4WW without pain exacerbation;  04/04/22: 1200 ft without pain   Time 4   Period Weeks    Status MET   Target Date       PT SHORT TERM GOAL #3   Title Pt to demonstrate improved tolerance to power activities AEB 5xSTS <15 secs    Baseline Eval: 19sec; 03/11/22: 11.45sec; 04/04/22: 11.63 sec   Time 4   Period Weeks    Status MET   Target Date 03/18/22                                     PT SHORT TERM GOAL #4  Title Pt will understand precautions, including post-maneuver precautions, and technique with canalith repositioning maneuver in order to reduce dizziness and decrease fall risk  Baseline 11/28: pt verbalized understanding for all, successfully completes maneuver in session with PT  Time 4  Period Weeks   Status MET  Target Date         PT Long Term Goals - TARGET DATE 05/13/2022          PT LONG TERM GOAL #1   Title Pt to improve score on FOTO survery by >8 points to indicate improved self efficicacy in ADL/IADL mobility.    Baseline Eval: 40; 04/04/22: 52   Time 6    Period Weeks    Status MET    Target Date 05/13/22     PT LONG TERM GOAL #2   Title Pt to demonstrate improved standing tolerance at home to >20 minutes.    Baseline eval: 5-10 minutes; 04/04/22: 30 minutes   Time 6    Period Weeks    Status New    Target Date 05/13/22     PT LONG TERM GOAL #3   Title Pt to demonstrate tolerance to 6MWT without  need to sit during testing, performance >10037f    Baseline eval: tolerates less than 2 minutes of walking.  03/11/22: c 4WW AMB 2m46m without pain exacerbation ([id number]/1/23: c 4WW AMB 1207 ft without pain   Time 6    Period Weeks    Status MET    Target Date 05/13/21   PT LONG TERM GOAL #4  Title Pt to demonstrate tolerance to 6MWT without AD or need to sit during testing, performance >1000ft87fBaseline  04/04/22: Pt able to perform 1207 ft c 4WW   Time 4  Period Weeks   Status NEW   Target Date 05/13/21              JoshuGwenlyn Saran DPT Physical Therapist- [facility]03/24, 5:08 PM

## 2022-05-08 ENCOUNTER — Ambulatory Visit: Payer: Medicare HMO | Attending: Internal Medicine

## 2022-05-08 DIAGNOSIS — M25551 Pain in right hip: Secondary | ICD-10-CM | POA: Insufficient documentation

## 2022-05-08 DIAGNOSIS — M545 Low back pain, unspecified: Secondary | ICD-10-CM | POA: Insufficient documentation

## 2022-05-08 DIAGNOSIS — M5416 Radiculopathy, lumbar region: Secondary | ICD-10-CM | POA: Insufficient documentation

## 2022-05-08 DIAGNOSIS — G8929 Other chronic pain: Secondary | ICD-10-CM | POA: Insufficient documentation

## 2022-05-08 DIAGNOSIS — R2681 Unsteadiness on feet: Secondary | ICD-10-CM | POA: Insufficient documentation

## 2022-05-08 DIAGNOSIS — M6281 Muscle weakness (generalized): Secondary | ICD-10-CM | POA: Insufficient documentation

## 2022-05-08 DIAGNOSIS — R262 Difficulty in walking, not elsewhere classified: Secondary | ICD-10-CM | POA: Insufficient documentation

## 2022-05-08 DIAGNOSIS — M5417 Radiculopathy, lumbosacral region: Secondary | ICD-10-CM | POA: Insufficient documentation

## 2022-05-08 DIAGNOSIS — R42 Dizziness and giddiness: Secondary | ICD-10-CM | POA: Insufficient documentation

## 2022-05-13 ENCOUNTER — Ambulatory Visit: Payer: Medicare HMO

## 2022-05-13 DIAGNOSIS — M545 Low back pain, unspecified: Secondary | ICD-10-CM | POA: Diagnosis present

## 2022-05-13 DIAGNOSIS — R2681 Unsteadiness on feet: Secondary | ICD-10-CM | POA: Diagnosis present

## 2022-05-13 DIAGNOSIS — R262 Difficulty in walking, not elsewhere classified: Secondary | ICD-10-CM

## 2022-05-13 DIAGNOSIS — M5417 Radiculopathy, lumbosacral region: Secondary | ICD-10-CM

## 2022-05-13 DIAGNOSIS — G8929 Other chronic pain: Secondary | ICD-10-CM | POA: Diagnosis present

## 2022-05-13 DIAGNOSIS — M25551 Pain in right hip: Secondary | ICD-10-CM | POA: Diagnosis present

## 2022-05-13 DIAGNOSIS — M5416 Radiculopathy, lumbar region: Secondary | ICD-10-CM | POA: Diagnosis present

## 2022-05-13 DIAGNOSIS — M6281 Muscle weakness (generalized): Secondary | ICD-10-CM

## 2022-05-13 DIAGNOSIS — R42 Dizziness and giddiness: Secondary | ICD-10-CM | POA: Diagnosis present

## 2022-05-13 NOTE — Therapy (Signed)
OUTPATIENT PHYSICAL THERAPY TREATMENT NOTE/RE-CERTIFICATION    Patient Name: Jessica Cole MRN: [id number] DOB:July 11, 1950, 72 y.o., female Today's Date: 05/13/2022  PCP: Lynnea Ferrier, MD REFERRING PROVIDER: Tia Alert, MD   PT End of Session - 05/13/22 [phone]     Visit Number 18    Number of Visits 21   *corrected to reflect recent recertification   Date for PT Re-Evaluation 05/13/22    Authorization Type [employer]    Authorization Time Period 02/24/22-04/07/22    Progress Note Due on Visit 10    PT Start Time 0931    PT Stop Time 1015    PT Time Calculation (min) 44 min    Equipment Utilized During Treatment Gait belt    Activity Tolerance Patient tolerated treatment well;Patient limited by pain    Behavior During Therapy WFL for tasks assessed/performed                Past Medical History:  Diagnosis Date   Anxiety    Asthma    seasonal with allergies   Colon polyps    Degenerative arthritis    Depression    Diabetes mellitus without complication (HCC)    Type II   Environmental allergies    Family history of adverse reaction to anesthesia    sister- PONV   Fatty liver    Glaucoma    Headache(784.0)    HX  MIGRAINES   History of bronchitis    History of kidney stones    History of pneumonia    Hypertension    Hypothyroidism    Obesity    OSA on CPAP    Plantar fasciitis    right   PONV (postoperative nausea and vomiting)    pt has never had any anesthesia complications, no post-op nausea or vomiting   Renal calculi    Renal calculi    Rheumatoid arthritis (HCC)    RLS (restless legs syndrome) 08/23/2014     Past Surgical History:  Procedure Laterality Date   ABDOMINAL HYSTERECTOMY     partial   APPENDECTOMY     Arthroscopic surgery, knee Left    BREAST BIOPSY Right    several   BREAST BIOPSY Right 03/11/2017   u/s bx neg   BUNIONECTOMY     LEFT    COLONOSCOPY W/ POLYPECTOMY     COLONOSCOPY WITH PROPOFOL N/A 08/14/2017    Procedure: COLONOSCOPY WITH PROPOFOL;  Surgeon: Christena Deem, MD;  Location: [facility] ENDOSCOPY;  Service: Endoscopy;  Laterality: N/A;   EXTRACORPOREAL SHOCK WAVE LITHOTRIPSY Left 05/14/2017   Procedure: EXTRACORPOREAL SHOCK WAVE LITHOTRIPSY (ESWL);  Surgeon: Riki Altes, MD;  Location: [facility] ORS;  Service: Urology;  Laterality: Left;   EXTRACORPOREAL SHOCK WAVE LITHOTRIPSY Left 06/14/2020   Procedure: EXTRACORPOREAL SHOCK WAVE LITHOTRIPSY (ESWL);  Surgeon: Sondra Come, MD;  Location: [facility] ORS;  Service: Urology;  Laterality: Left;   FOOT SURGERY     RIGHT     KNEE ARTHROSCOPY W/ MENISCAL REPAIR Left    LASIK     LUMBAR LAMINECTOMY/DECOMPRESSION MICRODISCECTOMY Right 03/01/2021   Procedure: Microdiscectomy - Lumbar five-Sacral one - right;  Surgeon: Tia Alert, MD;  Location: [facility] OR;  Service: Neurosurgery;  Laterality: Right;   MAXIMUM ACCESS (MAS)POSTERIOR LUMBAR INTERBODY FUSION (PLIF) 1 LEVEL N/A 04/25/2016   Procedure: LUMBAR FOUR-FIVE  MAXIMUM ACCESS (MAS) POSTERIOR LUMBAR INTERBODY FUSION (PLIF) with extension of instrumentation LUMBAR TWO-FIVE;  Surgeon: Tia Alert, MD;  Location: [facility] OR;  Service: Neurosurgery;  Laterality: N/A;   MAXIMUM ACCESS (MAS)POSTERIOR LUMBAR INTERBODY FUSION (PLIF) 2 LEVEL N/A 12/13/2015   Procedure: Lumbar two-three - Lumbar three-four MAXIMUM ACCESS (MAS) POSTERIOR LUMBAR INTERBODY FUSION (PLIF)  ;  Surgeon: Tia Alert, MD;  Location: [facility] NEURO ORS;  Service: Neurosurgery;  Laterality: N/A;   REVERSE SHOULDER ARTHROPLASTY Left 03/01/2020   Procedure: REVERSE SHOULDER ARTHROPLASTY;  Surgeon: Christena Flake, MD;  Location: [facility] ORS;  Service: Orthopedics;  Laterality: Left;   SHOULDER SURGERY     RIGHT    TONSILLECTOMY     Patient Active Problem List   Diagnosis Date Noted   S/P lumbar laminectomy 03/01/2021   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) 01/03/2019    Chronic cough 11/19/2016   Morbid obesity (HCC) 09/26/2016   Left ovarian cyst 05/30/2016   S/P lumbar spinal fusion 12/13/2015   Aortic calcification (HCC) 07/18/2015   Controlled type 2 diabetes mellitus without complication (HCC) 05/16/2015   Thrombocytopenia (HCC) 05/16/2015   Recurrent major depressive disorder, in full remission (HCC) 05/16/2015   Essential (primary) hypertension 05/16/2015   Fatty infiltration of liver 03/15/2015   Aortic valve stenosis, nonrheumatic 01/18/2015   Sciatica of right side 01/09/2015   Type 2 diabetes mellitus (HCC) 11/10/2014   RLS (restless legs syndrome) 08/23/2014   Neuritis or radiculitis due to rupture of lumbar intervertebral disc 06/13/2014   Degeneration of intervertebral disc of lumbar region 06/13/2014   Lumbar radiculitis 06/13/2014   Arthritis 01/23/2014   Arthritis of knee, degenerative 11/15/2013   Depression 09/29/2013   Insomnia 09/29/2013   Apnea, sleep 08/29/2013   History of nephrolithiasis 08/29/2013   Abnormal presence of protein in urine 08/29/2013   Headache, migraine 08/29/2013   BP (high blood pressure) 08/29/2013   HLD (hyperlipidemia) 08/29/2013   Allergic rhinitis 08/29/2013   Intractable migraine without aura 02/18/2013    REFERRING DIAG: Radiculopathy, Lumbosacral Region  THERAPY DIAG:   Chronic right-sided low back pain, unspecified whether sciatica present   Radiculopathy, lumbosacral region   Difficulty in walking, not elsewhere classified  Muscle weakness (generalized)  Chronic bilateral low back pain, unspecified whether sciatica present  Dizziness and giddiness  Unsteadiness on feet  Difficulty in walking, not elsewhere classified  Chronic right-sided low back pain, unspecified whether sciatica present  Radiculopathy, lumbosacral region  Radiculopathy, lumbar region  Pain in right hip  Low back pain, unspecified back pain laterality, unspecified chronicity, unspecified whether  sciatica present  Rationale for Evaluation and Treatment Rehabilitation  PERTINENT HISTORY:  Pt reports chronic low back pain with radiation into the right hip and Rt posterior leg. She is a limited historian due to memory recall deficits. Lumbar surgery has been recommended, however insurance is mandating a 6 week trial of PT. Lumbar surgical history includes: PLIF L4-5, L2-08 Apr 2016, L2/3 & L3/07 Dec 2015, Rt L5-S1 Microdiskectomy/laminectomy 03/01/21  PRECAUTIONS: Fall Risk   SUBJECTIVE:   SUBJECTIVE STATEMENT:   Pt reports she is in more pain today than she has been in a while.  Pt notes that last night she felt like her sciatica flared up and she's had pain in her legs since then.     PAIN: Are you having pain? Yes: NPRS scale: 4-5/10 Pain location: B LE sciatic pain in the lumbar region going down to both LE's. Pain description: dull    TODAY'S TREATMENT:   TherEx:  On mat table:  Figure 4 stretch 30  sec bouts each LE with overpressure applied by therapist, x3 Piriformis stretch 30 sec bouts each LE with overpressure applied by therapist, x3  Supine LTR with green physioball under LE's for improved mobility, 2x15 each side Supine hamstring curls with green physioball under LE's for improved muscular contraction and increased hip flexion, 2x15    TherAct:  Goal assessment performed as noted below:  No dizziness upon standing today.     PATIENT EDUCATION: Education details: Pt educated throughout session about proper posture and technique with exercises. Improved exercise technique, movement at target joints, use of target muscles after min to mod verbal, visual, tactile cues.   Person educated: Patient Education method: Explanation, Tactile cues, and Verbal cues Education comprehension: verbalized understanding, returned demonstration, verbal cues required, and tactile cues required    HOME EXERCISE PROGRAM:   Pt to continue HEP as previously  given  Access Code: [email or web address] URL: [email or web address] Date: 04/04/2022 Prepared by: Tomasa Hose  Exercises - Isometric Shoulder Flexion at Wall  - 1 x daily - 7 x weekly - 2 sets - 10 reps - 3 hold - Isometric Shoulder Extension at Wall  - 1 x daily - 7 x weekly - 2 sets - 10 reps - 3 hold - Isometric Shoulder Abduction at Wall  - 1 x daily - 7 x weekly - 2 sets - 10 reps - 3 hold - Isometric Shoulder Adduction  - 1 x daily - 7 x weekly - 2 sets - 10 reps - 3 hold    Plan - 02/26/22 0855     Clinical Impression Statement  Pt performed well with the tasks given today and has made minimal improvements in goals at this time.  Pt has progressed with overall tolerance levels and is able to do more activities, but pain continues to be consistent.  Pt has not been consistent with performing HEP and would benefit from revised HEP in order for pt to improve overall tolerance for exercise as well.  Pt does acknowledge that she will likely be requiring surgery on her back and the R shoulder, but would like to continue to perform therapy in preparation for the surgery.  Patient's condition has the potential to improve in response to therapy. Maximum improvement is yet to be obtained. The anticipated improvement is attainable and reasonable in a generally predictable time.   Pt will continue to benefit from skilled therapy to address remaining deficits in order to improve overall QoL and return to PLOF.           Personal Factors and Comorbidities Comorbidity 3+;Age;Fitness;Past/Current Experience;Time since onset of injury/illness/exacerbation    Comorbidities memory impairment, anxiety, depression, DM, hx of kidney stones, obesity, RA    Examination-Activity Limitations Bed Mobility;Stand;Lift;Locomotion Level;Bend;Transfers;Carry;Continence;Sit;Sleep;Stairs;Squat    Examination-Participation Restrictions Church;Meal Prep;Cleaning;Community Activity;Driving;Interpersonal  Relationship;Laundry;Yard Work    Conservation officer, historic buildings Evolving/Moderate complexity    Rehab Potential Poor    PT Frequency 2x / week    PT Duration 6 weeks    PT Treatment/Interventions Balance training;Neuromuscular re-education;Therapeutic activities;Patient/family education;Manual techniques;Therapeutic exercise;Electrical Stimulation;Cryotherapy;Moist Heat;Stair training;Functional mobility training;DME Instruction;Gait training;Passive range of motion;Dry needling    PT Next Visit Plan Continue with work toward goal achievement    PT Home Exercise Plan Access Code: [email or web address] URL: [email or web address] Date: 03/06/2022 Prepared by: Alvera Novel  Exercises - Supine Hip Adduction Isometric with Ball  - 1 x daily - 5 x weekly - 3 sets - 15 reps - 3 seconds hold - Sit to Stand with Counter Support  -  1 x daily - 5 x weekly - 3 sets - 10 reps - Seated Long Arc Quad  - 1 x daily - 5 x weekly - 3 sets - 10 reps - 3 hold - Side Stepping with Counter Support  - 1 x daily - 5 x weekly - 3 sets - 20 reps   Consulted and Agree with Plan of Care Patient                 PT Short Term Goals - TARGET DATE 04/29/2022        PT SHORT TERM GOAL #1   Title Pt will be independent with her initial HEP to decrease pain, improve strength, function, and ability to perform transfers more comfortably and with less difficulty.  03/11/22: limited by ST memory deficits;    Time 4   Period Weeks    Status Ongoing    Target Date       PT SHORT TERM GOAL #2   Title Pt will demonstrate improved tolerance to overground AMB by> 576ft without pain limitation:    Baseline eval: <399ft; 03/11/22: 469ft without pain exacerbation, no AD, then 765ft c 4WW without pain exacerbation;  04/04/22: 1200 ft without pain   Time 4   Period Weeks    Status MET   Target Date       PT SHORT TERM GOAL #3   Title Pt to demonstrate improved tolerance to power activities AEB 5xSTS <15 secs     Baseline Eval: 19sec; 03/11/22: 11.45sec; 04/04/22: 11.63 sec   Time 4   Period Weeks    Status MET   Target Date 03/18/22                                     PT SHORT TERM GOAL #4  Title Pt will understand precautions, including post-maneuver precautions, and technique with canalith repositioning maneuver in order to reduce dizziness and decrease fall risk  Baseline 11/28: pt verbalized understanding for all, successfully completes maneuver in session with PT  Time 4  Period Weeks   Status MET  Target Date         PT Long Term Goals - TARGET DATE 05/13/2022       PT LONG TERM GOAL #1   Title Pt to improve score on FOTO survery by >8 points to indicate improved self efficicacy in ADL/IADL mobility.    Baseline Eval: 40; 04/04/22: 52   Time 6    Period Weeks    Status MET    Target Date 05/13/22     PT LONG TERM GOAL #2   Title Pt to demonstrate improved standing tolerance at home to >20 minutes.    Baseline eval: 5-10 minutes;  04/04/22: 30 minutes 05/13/22: 10 minutes   Time 6    Period Weeks    Status Ongoing    Target Date 06/10/22     PT LONG TERM GOAL #3   Title Pt to demonstrate tolerance to without need to sit during testing, performance >1030ft.    Baseline eval: tolerates less than 2 minutes of walking.  03/11/22: c 4WW AMB 53m30s without pain exacerbation (71ft)  04/04/22: c 4WW AMB 1207 ft without pain   Time 6    Period Weeks    Status MET    Target Date 05/13/21   PT LONG TERM GOAL #4  Title Pt to demonstrate tolerance to  6MWT without AD or need to sit during testing, performance >106ft.   Baseline  04/04/22: Pt able to perform 1207 ft c 4WW  05/13/22: 940 ft w/o AD   Time 4  Period Weeks   Status NEW   Target Date 06/10/2022    PT LONG TERM GOAL #5  Title Pt to be able to get on Gazelle exercise machine at home and tolerate 15 minutes of constant mobility without increase in pain.  Baseline  05/13/22:  Pt unable to get on machine without pain.    Time 4  Period Weeks   Status NEW   Target Date 06/10/2022              Gwenlyn Saran, PT, DPT Physical Therapist- [facility]  05/13/22, 10:10 AM

## 2022-05-15 ENCOUNTER — Ambulatory Visit: Payer: Medicare HMO | Attending: Neurological Surgery

## 2022-05-15 DIAGNOSIS — M25612 Stiffness of left shoulder, not elsewhere classified: Secondary | ICD-10-CM | POA: Diagnosis present

## 2022-05-15 DIAGNOSIS — R2681 Unsteadiness on feet: Secondary | ICD-10-CM | POA: Diagnosis present

## 2022-05-15 DIAGNOSIS — M5417 Radiculopathy, lumbosacral region: Secondary | ICD-10-CM | POA: Insufficient documentation

## 2022-05-15 DIAGNOSIS — R262 Difficulty in walking, not elsewhere classified: Secondary | ICD-10-CM | POA: Diagnosis present

## 2022-05-15 DIAGNOSIS — M5416 Radiculopathy, lumbar region: Secondary | ICD-10-CM | POA: Insufficient documentation

## 2022-05-15 DIAGNOSIS — R42 Dizziness and giddiness: Secondary | ICD-10-CM | POA: Insufficient documentation

## 2022-05-15 DIAGNOSIS — M25551 Pain in right hip: Secondary | ICD-10-CM | POA: Insufficient documentation

## 2022-05-15 DIAGNOSIS — M545 Low back pain, unspecified: Secondary | ICD-10-CM | POA: Diagnosis present

## 2022-05-15 DIAGNOSIS — M6281 Muscle weakness (generalized): Secondary | ICD-10-CM | POA: Diagnosis present

## 2022-05-15 DIAGNOSIS — M25512 Pain in left shoulder: Secondary | ICD-10-CM | POA: Diagnosis present

## 2022-05-15 DIAGNOSIS — G8929 Other chronic pain: Secondary | ICD-10-CM | POA: Insufficient documentation

## 2022-05-15 NOTE — Therapy (Signed)
OUTPATIENT PHYSICAL THERAPY TREATMENT NOTE    Patient Name: Jessica Cole MRN: [id number] DOB:[birth year], 72 y.o., female Today's Date: 05/15/2022  PCP: Adin Hector, MD REFERRING PROVIDER: Eustace Moore, MD   PT End of Session - 05/15/22 0849     Visit Number 19    Number of Visits 46   *corrected to reflect recent recertification   Date for PT Re-Evaluation 06/10/22    Authorization Type [employer]    Authorization Time Period 02/24/22-04/07/22    Progress Note Due on Visit 10    PT Start Time 0849    PT Stop Time 0930    PT Time Calculation (min) 41 min    Equipment Utilized During Treatment Gait belt    Activity Tolerance Patient tolerated treatment well;Patient limited by pain    Behavior During Therapy WFL for tasks assessed/performed                 Past Medical History:  Diagnosis Date   Anxiety    Asthma    seasonal with allergies   Colon polyps    Degenerative arthritis    Depression    Diabetes mellitus without complication ([place])    Type II   Environmental allergies    Family history of adverse reaction to anesthesia    sister- PONV   Fatty liver    Glaucoma    Headache(784.0)    HX  MIGRAINES   History of bronchitis    History of kidney stones    History of pneumonia    Hypertension    Hypothyroidism    Obesity    OSA on CPAP    Plantar fasciitis    right   PONV (postoperative nausea and vomiting)    pt has never had any anesthesia complications, no post-op nausea or vomiting   Renal calculi    Renal calculi    Rheumatoid arthritis (HCC)    RLS (restless legs syndrome) 08/23/2014     Past Surgical History:  Procedure Laterality Date   ABDOMINAL HYSTERECTOMY     partial   APPENDECTOMY     Arthroscopic surgery, knee Left    BREAST BIOPSY Right    several   BREAST BIOPSY Right 03/11/2017   u/s bx neg   BUNIONECTOMY     LEFT    COLONOSCOPY W/ POLYPECTOMY     COLONOSCOPY WITH PROPOFOL N/A 08/14/2017   Procedure:  COLONOSCOPY WITH PROPOFOL;  Surgeon: Lollie Sails, MD;  Location: [facility] ENDOSCOPY;  Service: Endoscopy;  Laterality: N/A;   EXTRACORPOREAL SHOCK WAVE LITHOTRIPSY Left 05/14/2017   Procedure: EXTRACORPOREAL SHOCK WAVE LITHOTRIPSY (ESWL);  Surgeon: Abbie Sons, MD;  Location: [facility] ORS;  Service: Urology;  Laterality: Left;   EXTRACORPOREAL SHOCK WAVE LITHOTRIPSY Left 06/14/2020   Procedure: EXTRACORPOREAL SHOCK WAVE LITHOTRIPSY (ESWL);  Surgeon: Billey Co, MD;  Location: [facility] ORS;  Service: Urology;  Laterality: Left;   FOOT SURGERY     RIGHT     KNEE ARTHROSCOPY W/ MENISCAL REPAIR Left    LASIK     LUMBAR LAMINECTOMY/DECOMPRESSION MICRODISCECTOMY Right 03/01/2021   Procedure: Microdiscectomy - Lumbar five-Sacral one - right;  Surgeon: Eustace Moore, MD;  Location: [place];  Service: Neurosurgery;  Laterality: Right;   MAXIMUM ACCESS (MAS)POSTERIOR LUMBAR INTERBODY FUSION (PLIF) 1 LEVEL N/A 04/25/2016   Procedure: LUMBAR FOUR-FIVE  MAXIMUM ACCESS (MAS) POSTERIOR LUMBAR INTERBODY FUSION (PLIF) with extension of instrumentation LUMBAR TWO-FIVE;  Surgeon: Eustace Moore, MD;  Location: [place];  Service: Neurosurgery;  Laterality: N/A;   MAXIMUM ACCESS (MAS)POSTERIOR LUMBAR INTERBODY FUSION (PLIF) 2 LEVEL N/A 12/13/2015   Procedure: Lumbar two-three - Lumbar three-four MAXIMUM ACCESS (MAS) POSTERIOR LUMBAR INTERBODY FUSION (PLIF)  ;  Surgeon: Tia Alert, MD;  [facility] NEURO ORS;  Service: Neurosurgery;  Laterality: N/A;   REVERSE SHOULDER ARTHROPLASTY Left 03/01/2020   Procedure: REVERSE SHOULDER ARTHROPLASTY;  Surgeon: Christena Flake, MD;  Location: [facility] ORS;  Service: Orthopedics;  Laterality: Left;   SHOULDER SURGERY     RIGHT    TONSILLECTOMY     Patient Active Problem List   Diagnosis Date Noted   S/P lumbar laminectomy 03/01/2021   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) 01/03/2019   Chronic  cough 11/19/2016   Morbid obesity (HCC) 09/26/2016   Left ovarian cyst 05/30/2016   S/P lumbar spinal fusion 12/13/2015   Aortic calcification (HCC) 07/18/2015   Controlled type 2 diabetes mellitus without complication (HCC) 05/16/2015   Thrombocytopenia (HCC) 05/16/2015   Recurrent major depressive disorder, in full remission (HCC) 05/16/2015   Essential (primary) hypertension 05/16/2015   Fatty infiltration of liver 03/15/2015   Aortic valve stenosis, nonrheumatic 01/18/2015   Sciatica of right side 01/09/2015   Type 2 diabetes mellitus (HCC) 11/10/2014   RLS (restless legs syndrome) 08/23/2014   Neuritis or radiculitis due to rupture of lumbar intervertebral disc 06/13/2014   Degeneration of intervertebral disc of lumbar region 06/13/2014   Lumbar radiculitis 06/13/2014   Arthritis 01/23/2014   Arthritis of knee, degenerative 11/15/2013   Depression 09/29/2013   Insomnia 09/29/2013   Apnea, sleep 08/29/2013   History of nephrolithiasis 08/29/2013   Abnormal presence of protein in urine 08/29/2013   Headache, migraine 08/29/2013   BP (high blood pressure) 08/29/2013   HLD (hyperlipidemia) 08/29/2013   Allergic rhinitis 08/29/2013   Intractable migraine without aura 02/18/2013    REFERRING DIAG: Radiculopathy, Lumbosacral Region  THERAPY DIAG:   Chronic right-sided low back pain, unspecified whether sciatica present   Radiculopathy, lumbosacral region   Difficulty in walking, not elsewhere classified  Muscle weakness (generalized)  Chronic bilateral low back pain, unspecified whether sciatica present  Dizziness and giddiness  Unsteadiness on feet  Difficulty in walking, not elsewhere classified  Chronic right-sided low back pain, unspecified whether sciatica present  Radiculopathy, lumbosacral region  Radiculopathy, lumbar region  Pain in right hip  Low back pain, unspecified back pain laterality, unspecified chronicity, unspecified whether sciatica  present  Chronic left shoulder pain  Stiffness of left shoulder, not elsewhere classified  Rationale for Evaluation and Treatment Rehabilitation  PERTINENT HISTORY:  Pt reports chronic low back pain with radiation into the right hip and Rt posterior leg. She is a limited historian due to memory recall deficits. Lumbar surgery has been recommended, however insurance is mandating a 6 week trial of PT. Lumbar surgical history includes: PLIF L4-5, L2-08 Apr 2016, L2/3 & L3/07 Dec 2015, Rt L5-S1 Microdiskectomy/laminectomy 03/01/21  PRECAUTIONS: Fall Risk   SUBJECTIVE:   SUBJECTIVE STATEMENT:   Pt reports she is a little sore today.  Pt notes the soreness in the legs and around the hips.     PAIN: Are you having pain? Yes: NPRS scale: 3-4/10 Pain location: B LE sciatic pain in the lumbar region going down to both LE's. Pain description: dull    TODAY'S TREATMENT:   TherEx:  Seated piriformis stretch, 30 sec holds x3 each LE Seated figure 4  stretch, 30 sec holds, x3 each LE Seated physioball roll-outs with L UE only due to pain in the R UE, 3 sec holds x10 Seated adduction into rainbow physioball, 2x10 Seated LAQ with hip adduction into rainbow physioball, 2x10 each LE Seated hamstring curl with BTB resistance, 2x15 each LE Step ups onto 6" step, 2x10 each LE leading Standing hip abduction with UE support at treadmill, 2x10 each LE         PATIENT EDUCATION: Education details: Pt educated throughout session about proper posture and technique with exercises. Improved exercise technique, movement at target joints, use of target muscles after min to mod verbal, visual, tactile cues.   Person educated: Patient Education method: Explanation, Tactile cues, and Verbal cues Education comprehension: verbalized understanding, returned demonstration, verbal cues required, and tactile cues required    HOME EXERCISE PROGRAM:   Access Code: [email or web address] URL:  [email or web address] Date: 05/15/2022 Prepared by: Tomasa Hose  Exercises - Supine Sciatic Nerve Glide  - 1 x daily - 7 x weekly - 3 sets - 10 reps - Supine Figure 4 Piriformis Stretch  - 1 x daily - 7 x weekly - 3 sets - 10 reps - Supine Piriformis Stretch with Leg Straight  - 1 x daily - 7 x weekly - 3 sets - 10 reps - Seated Piriformis Stretch  - 1 x daily - 7 x weekly - 3 sets - 10 reps - Seated Piriformis Stretch  - 1 x daily - 7 x weekly - 3 sets - 10 reps   Pt to continue HEP as previously given  Access Code: [email or web address] URL: [email or web address] Date: 04/04/2022 Prepared by: Tomasa Hose  Exercises - Isometric Shoulder Flexion at Wall  - 1 x daily - 7 x weekly - 2 sets - 10 reps - 3 hold - Isometric Shoulder Extension at Wall  - 1 x daily - 7 x weekly - 2 sets - 10 reps - 3 hold - Isometric Shoulder Abduction at Wall  - 1 x daily - 7 x weekly - 2 sets - 10 reps - 3 hold - Isometric Shoulder Adduction  - 1 x daily - 7 x weekly - 2 sets - 10 reps - 3 hold    Plan - 02/26/22 0855     Clinical Impression Statement  Pt put forth great effort throughout the treatment session.  Pt is able to improve mobility and notes some release in doing piriformis/sciatic exercises with HEP updated as noted above.  Pt states she will try to perform those at home in order to help alleviate the pain she is experiencing in the lumbar spine/hips.  Pt also planning on getting injection for shoulder tomorrow and will assess pt following that intervention at next appointment.   Pt will continue to benefit from skilled therapy to address remaining deficits in order to improve overall QoL and return to PLOF.          Personal Factors and Comorbidities Comorbidity 3+;Age;Fitness;Past/Current Experience;Time since onset of injury/illness/exacerbation    Comorbidities memory impairment, anxiety, depression, DM, hx of kidney stones, obesity, RA    Examination-Activity  Limitations Bed Mobility;Stand;Lift;Locomotion Level;Bend;Transfers;Carry;Continence;Sit;Sleep;Stairs;Squat    Examination-Participation Restrictions Church;Meal Prep;Cleaning;Community Activity;Driving;Interpersonal Relationship;Laundry;Yard Work    Conservation officer, historic buildings Evolving/Moderate complexity    Rehab Potential Poor    PT Frequency 2x / week    PT Duration 6 weeks    PT Treatment/Interventions Balance training;Neuromuscular re-education;Therapeutic activities;Patient/family education;Manual techniques;Therapeutic exercise;Electrical Stimulation;Cryotherapy;Moist Heat;Stair training;Functional mobility training;DME Instruction;Gait training;Passive range of motion;Dry needling  PT Next Visit Plan Continue with work toward goal achievement    PT Home Exercise Plan Access Code: [email or web address] URL: [email or web address] Date: 03/06/2022 Prepared by: Rebbeca Paul  Exercises - Supine Hip Adduction Isometric with Ball  - 1 x daily - 5 x weekly - 3 sets - 15 reps - 3 seconds hold - Sit to Stand with Counter Support  - 1 x daily - 5 x weekly - 3 sets - 10 reps - Seated Long Arc Quad  - 1 x daily - 5 x weekly - 3 sets - 10 reps - 3 hold - Side Stepping with Counter Support  - 1 x daily - 5 x weekly - 3 sets - 20 reps   Consulted and Agree with Plan of Care Patient                 PT Short Term Goals - TARGET DATE 04/29/2022        PT SHORT TERM GOAL #1   Title Pt will be independent with her initial HEP to decrease pain, improve strength, function, and ability to perform transfers more comfortably and with less difficulty.  03/11/22: limited by ST memory deficits;    Time 4   Period Weeks    Status Ongoing    Target Date       PT SHORT TERM GOAL #2   Title Pt will demonstrate improved tolerance to overground AMB by> 568ft without pain limitation:    Baseline eval: <353ft; 03/11/22: 438ft without pain exacerbation, no AD, then 722ft c 4WW without pain  exacerbation;  04/04/22: 1200 ft without pain   Time 4   Period Weeks    Status MET   Target Date       PT SHORT TERM GOAL #3   Title Pt to demonstrate improved tolerance to power activities AEB 5xSTS <15 secs    Baseline Eval: 19sec; 03/11/22: 11.45sec; 04/04/22: 11.63 sec   Time 4   Period Weeks    Status MET   Target Date 03/18/22                                     PT SHORT TERM GOAL #4  Title Pt will understand precautions, including post-maneuver precautions, and technique with canalith repositioning maneuver in order to reduce dizziness and decrease fall risk  Baseline 11/28: pt verbalized understanding for all, successfully completes maneuver in session with PT  Time 4  Period Weeks   Status MET  Target Date         PT Long Term Goals - TARGET DATE 05/13/2022       PT LONG TERM GOAL #1   Title Pt to improve score on FOTO survery by >8 points to indicate improved self efficicacy in ADL/IADL mobility.    Baseline Eval: 40; 04/04/22: 52   Time 6    Period Weeks    Status MET    Target Date 05/13/22     PT LONG TERM GOAL #2   Title Pt to demonstrate improved standing tolerance at home to >20 minutes.    Baseline eval: 5-10 minutes;  04/04/22: 30 minutes 05/13/22: 10 minutes   Time 6    Period Weeks    Status Ongoing    Target Date 06/10/22     PT LONG TERM GOAL #3   Title Pt to demonstrate tolerance to 6MWT without need to sit during testing, performance >107ft.  Baseline eval: tolerates less than 2 minutes of walking.  03/11/22: c 4WW AMB 82m30s without pain exacerbation (731ft)  04/04/22: c 4WW AMB 1207 ft without pain   Time 6    Period Weeks    Status MET    Target Date 05/13/21   PT LONG TERM GOAL #4  Title Pt to demonstrate tolerance to without AD or need to sit during testing, performance >1045ft.   Baseline  04/04/22: Pt able to perform 1207 ft c 4WW  05/13/22: 940 ft w/o AD   Time 4  Period Weeks   Status NEW   Target Date 06/10/2022    PT  LONG TERM GOAL #5  Title Pt to be able to get on Gazelle exercise machine at home and tolerate 15 minutes of constant mobility without increase in pain.  Baseline  05/13/22:  Pt unable to get on machine without pain.   Time 4  Period Weeks   Status NEW   Target Date 06/10/2022              Nolon Bussing, PT, DPT Physical Therapist- [facility]  05/15/22, 11:10 AM

## 2022-05-20 ENCOUNTER — Ambulatory Visit: Payer: Medicare HMO

## 2022-05-20 DIAGNOSIS — R2681 Unsteadiness on feet: Secondary | ICD-10-CM

## 2022-05-20 DIAGNOSIS — R42 Dizziness and giddiness: Secondary | ICD-10-CM

## 2022-05-20 DIAGNOSIS — M5416 Radiculopathy, lumbar region: Secondary | ICD-10-CM

## 2022-05-20 DIAGNOSIS — M25612 Stiffness of left shoulder, not elsewhere classified: Secondary | ICD-10-CM

## 2022-05-20 DIAGNOSIS — M6281 Muscle weakness (generalized): Secondary | ICD-10-CM

## 2022-05-20 DIAGNOSIS — M5417 Radiculopathy, lumbosacral region: Secondary | ICD-10-CM

## 2022-05-20 DIAGNOSIS — M25551 Pain in right hip: Secondary | ICD-10-CM

## 2022-05-20 DIAGNOSIS — R262 Difficulty in walking, not elsewhere classified: Secondary | ICD-10-CM

## 2022-05-20 DIAGNOSIS — G8929 Other chronic pain: Secondary | ICD-10-CM

## 2022-05-20 DIAGNOSIS — M545 Low back pain, unspecified: Secondary | ICD-10-CM

## 2022-05-20 NOTE — Therapy (Signed)
OUTPATIENT PHYSICAL THERAPY TREATMENT NOTE/PHYSICAL THERAPY PROGRESS NOTE   Dates of reporting period  04/04/22   to   05/20/22     Patient Name: Jessica Cole MRN: [id number] DOB:12-Jul-1950, 72 y.o., female Today's Date: 05/20/2022  PCP: Adin Hector, MD REFERRING PROVIDER: Eustace Moore, MD   PT End of Session - 05/20/22 0818     Visit Number 20    Number of Visits 20   *corrected to reflect recent recertification   Date for PT Re-Evaluation 06/10/22    Authorization Type [employer]    Authorization Time Period 02/24/22-04/07/22    Progress Note Due on Visit 10    PT Start Time 0814    PT Stop Time 0845    PT Time Calculation (min) 31 min    Equipment Utilized During Treatment Gait belt    Activity Tolerance Patient tolerated treatment well;Patient limited by pain    Behavior During Therapy WFL for tasks assessed/performed                  Past Medical History:  Diagnosis Date   Anxiety    Asthma    seasonal with allergies   Colon polyps    Degenerative arthritis    Depression    Diabetes mellitus without complication ([place])    Type II   Environmental allergies    Family history of adverse reaction to anesthesia    sister- PONV   Fatty liver    Glaucoma    Headache(784.0)    HX  MIGRAINES   History of bronchitis    History of kidney stones    History of pneumonia    Hypertension    Hypothyroidism    Obesity    OSA on CPAP    Plantar fasciitis    right   PONV (postoperative nausea and vomiting)    pt has never had any anesthesia complications, no post-op nausea or vomiting   Renal calculi    Renal calculi    Rheumatoid arthritis (HCC)    RLS (restless legs syndrome) 08/23/2014     Past Surgical History:  Procedure Laterality Date   ABDOMINAL HYSTERECTOMY     partial   APPENDECTOMY     Arthroscopic surgery, knee Left    BREAST BIOPSY Right    several   BREAST BIOPSY Right 03/11/2017   u/s bx neg   BUNIONECTOMY     LEFT     COLONOSCOPY W/ POLYPECTOMY     COLONOSCOPY WITH PROPOFOL N/A 08/14/2017   Procedure: COLONOSCOPY WITH PROPOFOL;  Surgeon: Lollie Sails, MD;  Location: [facility] ENDOSCOPY;  Service: Endoscopy;  Laterality: N/A;   EXTRACORPOREAL SHOCK WAVE LITHOTRIPSY Left 05/14/2017   Procedure: EXTRACORPOREAL SHOCK WAVE LITHOTRIPSY (ESWL);  Surgeon: Abbie Sons, MD;  Location: [facility] ORS;  Service: Urology;  Laterality: Left;   EXTRACORPOREAL SHOCK WAVE LITHOTRIPSY Left 06/14/2020   Procedure: EXTRACORPOREAL SHOCK WAVE LITHOTRIPSY (ESWL);  Surgeon: Billey Co, MD;  Location: [facility] ORS;  Service: Urology;  Laterality: Left;   FOOT SURGERY     RIGHT     KNEE ARTHROSCOPY W/ MENISCAL REPAIR Left    LASIK     LUMBAR LAMINECTOMY/DECOMPRESSION MICRODISCECTOMY Right 03/01/2021   Procedure: Microdiscectomy - Lumbar five-Sacral one - right;  Surgeon: Eustace Moore, MD;  Location: [place];  Service: Neurosurgery;  Laterality: Right;   MAXIMUM ACCESS (MAS)POSTERIOR LUMBAR INTERBODY FUSION (PLIF) 1 LEVEL N/A 04/25/2016   Procedure: LUMBAR FOUR-FIVE  MAXIMUM ACCESS (MAS) POSTERIOR LUMBAR  INTERBODY FUSION (PLIF) with extension of instrumentation LUMBAR TWO-FIVE;  Surgeon: Tia Alert, MD;  Location: [facility] OR;  Service: Neurosurgery;  Laterality: N/A;   MAXIMUM ACCESS (MAS)POSTERIOR LUMBAR INTERBODY FUSION (PLIF) 2 LEVEL N/A 12/13/2015   Procedure: Lumbar two-three - Lumbar three-four MAXIMUM ACCESS (MAS) POSTERIOR LUMBAR INTERBODY FUSION (PLIF)  ;  Surgeon: Tia Alert, MD;  Location: [facility] NEURO ORS;  Service: Neurosurgery;  Laterality: N/A;   REVERSE SHOULDER ARTHROPLASTY Left 03/01/2020   Procedure: REVERSE SHOULDER ARTHROPLASTY;  Surgeon: Christena Flake, MD;  Location: [facility] ORS;  Service: Orthopedics;  Laterality: Left;   SHOULDER SURGERY     RIGHT    TONSILLECTOMY     Patient Active Problem List   Diagnosis Date Noted   S/P lumbar laminectomy 03/01/2021   Mild nonproliferative diabetic retinopathy of both eyes  without macular edema associated with type 2 diabetes mellitus (HCC) 01/03/2019   Chronic cough 11/19/2016   Morbid obesity (HCC) 09/26/2016   Left ovarian cyst 05/30/2016   S/P lumbar spinal fusion 12/13/2015   Aortic calcification (HCC) 07/18/2015   Controlled type 2 diabetes mellitus without complication (HCC) 05/16/2015   Thrombocytopenia (HCC) 05/16/2015   Recurrent major depressive disorder, in full remission (HCC) 05/16/2015   Essential (primary) hypertension 05/16/2015   Fatty infiltration of liver 03/15/2015   Aortic valve stenosis, nonrheumatic 01/18/2015   Sciatica of right side 01/09/2015   Type 2 diabetes mellitus (HCC) 11/10/2014   RLS (restless legs syndrome) 08/23/2014   Neuritis or radiculitis due to rupture of lumbar intervertebral disc 06/13/2014   Degeneration of intervertebral disc of lumbar region 06/13/2014   Lumbar radiculitis 06/13/2014   Arthritis 01/23/2014   Arthritis of knee, degenerative 11/15/2013   Depression 09/29/2013   Insomnia 09/29/2013   Apnea, sleep 08/29/2013   History of nephrolithiasis 08/29/2013   Abnormal presence of protein in urine 08/29/2013   Headache, migraine 08/29/2013   BP (high blood pressure) 08/29/2013   HLD (hyperlipidemia) 08/29/2013   Allergic rhinitis 08/29/2013   Intractable migraine without aura 02/18/2013    REFERRING DIAG: Radiculopathy, Lumbosacral Region  THERAPY DIAG:   Chronic right-sided low back pain, unspecified whether sciatica present   Radiculopathy, lumbosacral region   Difficulty in walking, not elsewhere classified  Muscle weakness (generalized)  Chronic bilateral low back pain, unspecified whether sciatica present  Dizziness and giddiness  Unsteadiness on feet  Difficulty in walking, not elsewhere classified  Chronic right-sided low back pain, unspecified whether sciatica present  Radiculopathy, lumbosacral region  Radiculopathy, lumbar region  Pain in right hip  Low back pain,  unspecified back pain laterality, unspecified chronicity, unspecified whether sciatica present  Chronic left shoulder pain  Stiffness of left shoulder, not elsewhere classified  Rationale for Evaluation and Treatment Rehabilitation  PERTINENT HISTORY:  Pt reports chronic low back pain with radiation into the right hip and Rt posterior leg. She is a limited historian due to memory recall deficits. Lumbar surgery has been recommended, however insurance is mandating a 6 week trial of PT. Lumbar surgical history includes: PLIF L4-5, L2-08 Apr 2016, L2/3 & L3/07 Dec 2015, Rt L5-S1 Microdiskectomy/laminectomy 03/01/21  PRECAUTIONS: Fall Risk   SUBJECTIVE:   SUBJECTIVE STATEMENT:   Pt reports that she is going to be having 2 surgeries at some point this year.  Pt notes that the MD told her that she would require the shoulder in the arm, but the back would have to wait.      PAIN: Are you having pain? Yes: NPRS  scale: 3-4/10 Pain location: B LE sciatic pain in the lumbar region going down to both LE's. Pain description: dull    TODAY'S TREATMENT:  TherEx:  Seated piriformis stretch, 30 sec holds x2 each LE Seated figure 4 stretch, 30 sec holds, x2 each LE Seated adduction into rainbow physioball, 2x10 Seated LAQ with 3# AW, 2x15 Seated hamstring curl with BTB resistance, 2x15 each LE Standing hip abduction, 3# AW donned,  with UE support at treadmill, 2x10 each LE Standing hip extension, 3# AW donned,  with UE support at treadmill, 2x10 each LE Standing hamstring curls, 3# AW donned, with UE support at treadmill, 2x10 each LE Marches around the gym with 3# AW donned, 2 total laps     PATIENT EDUCATION: Education details: Pt educated throughout session about proper posture and technique with exercises. Improved exercise technique, movement at target joints, use of target muscles after min to mod verbal, visual, tactile cues.   Person educated: Patient Education method:  Explanation, Tactile cues, and Verbal cues Education comprehension: verbalized understanding, returned demonstration, verbal cues required, and tactile cues required    HOME EXERCISE PROGRAM:   Access Code: [email or web address] URL: [email or web address] Date: 05/15/2022 Prepared by: Tomasa Hose  Exercises - Supine Sciatic Nerve Glide  - 1 x daily - 7 x weekly - 3 sets - 10 reps - Supine Figure 4 Piriformis Stretch  - 1 x daily - 7 x weekly - 3 sets - 10 reps - Supine Piriformis Stretch with Leg Straight  - 1 x daily - 7 x weekly - 3 sets - 10 reps - Seated Piriformis Stretch  - 1 x daily - 7 x weekly - 3 sets - 10 reps - Seated Piriformis Stretch  - 1 x daily - 7 x weekly - 3 sets - 10 reps   Pt to continue HEP as previously given  Access Code: [email or web address] URL: [email or web address] Date: 04/04/2022 Prepared by: Tomasa Hose  Exercises - Isometric Shoulder Flexion at Wall  - 1 x daily - 7 x weekly - 2 sets - 10 reps - 3 hold - Isometric Shoulder Extension at Wall  - 1 x daily - 7 x weekly - 2 sets - 10 reps - 3 hold - Isometric Shoulder Abduction at Wall  - 1 x daily - 7 x weekly - 2 sets - 10 reps - 3 hold - Isometric Shoulder Adduction  - 1 x daily - 7 x weekly - 2 sets - 10 reps - 3 hold    Plan - 02/26/22 0855     Clinical Impression Statement Pt session limited due to late arrival by pt.  Pt goals not assessed as they were done so at last visit for re-certification.  Pt is making good progress with strength deficits and is doing well overall.  Pt will continue to be monitored going forward until pt has one of the multiple surgeries she is anticipating needing.  Patient's condition has the potential to improve in response to therapy. Maximum improvement is yet to be obtained. The anticipated improvement is attainable and reasonable in a generally predictable time.   Pt will continue to benefit from skilled therapy to address remaining deficits in order to  improve overall QoL and return to PLOF.          Personal Factors and Comorbidities Comorbidity 3+;Age;Fitness;Past/Current Experience;Time since onset of injury/illness/exacerbation    Comorbidities memory impairment, anxiety, depression, DM, hx of kidney stones, obesity, RA    Examination-Activity Limitations Bed Mobility;Stand;Lift;Locomotion Level;Bend;Transfers;Carry;Continence;Sit;Sleep;Stairs;Squat  Examination-Participation Restrictions Church;Meal Prep;Cleaning;Community Activity;Driving;Interpersonal Relationship;Laundry;Yard Work    Conservation officer, historic buildings Evolving/Moderate complexity    Rehab Potential Poor    PT Frequency 2x / week    PT Duration 6 weeks    PT Treatment/Interventions Balance training;Neuromuscular re-education;Therapeutic activities;Patient/family education;Manual techniques;Therapeutic exercise;Electrical Stimulation;Cryotherapy;Moist Heat;Stair training;Functional mobility training;DME Instruction;Gait training;Passive range of motion;Dry needling    PT Next Visit Plan Continue with work toward goal achievement    PT Home Exercise Plan Access Code: [email or web address] URL: [email or web address] Date: 03/06/2022 Prepared by: Alvera Novel  Exercises - Supine Hip Adduction Isometric with Ball  - 1 x daily - 5 x weekly - 3 sets - 15 reps - 3 seconds hold - Sit to Stand with Counter Support  - 1 x daily - 5 x weekly - 3 sets - 10 reps - Seated Long Arc Quad  - 1 x daily - 5 x weekly - 3 sets - 10 reps - 3 hold - Side Stepping with Counter Support  - 1 x daily - 5 x weekly - 3 sets - 20 reps   Consulted and Agree with Plan of Care Patient                 PT Short Term Goals - TARGET DATE 04/29/2022        PT SHORT TERM GOAL #1   Title Pt will be independent with her initial HEP to decrease pain, improve strength, function, and ability to perform transfers more comfortably and with less difficulty.  03/11/22: limited by ST memory  deficits;    Time 4   Period Weeks    Status Ongoing    Target Date       PT SHORT TERM GOAL #2   Title Pt will demonstrate improved tolerance to overground AMB by> 516ft without pain limitation:    Baseline eval: <315ft; 03/11/22: 426ft without pain exacerbation, no AD, then 728ft c 4WW without pain exacerbation;  04/04/22: 1200 ft without pain   Time 4   Period Weeks    Status MET   Target Date       PT SHORT TERM GOAL #3   Title Pt to demonstrate improved tolerance to power activities AEB 5xSTS <15 secs    Baseline Eval: 19sec; 03/11/22: 11.45sec; 04/04/22: 11.63 sec   Time 4   Period Weeks    Status MET   Target Date 03/18/22                                     PT SHORT TERM GOAL #4  Title Pt will understand precautions, including post-maneuver precautions, and technique with canalith repositioning maneuver in order to reduce dizziness and decrease fall risk  Baseline 11/28: pt verbalized understanding for all, successfully completes maneuver in session with PT  Time 4  Period Weeks   Status MET  Target Date         PT Long Term Goals - TARGET DATE 05/13/2022       PT LONG TERM GOAL #1   Title Pt to improve score on FOTO survery by >8 points to indicate improved self efficicacy in ADL/IADL mobility.    Baseline Eval: 40; 04/04/22: 52   Time 6    Period Weeks    Status MET    Target Date 05/13/22     PT LONG TERM GOAL #2   Title Pt to demonstrate improved standing tolerance at home to >  20 minutes.    Baseline eval: 5-10 minutes;  04/04/22: 30 minutes 05/13/22: 10 minutes   Time 6    Period Weeks    Status Ongoing    Target Date 06/10/22     PT LONG TERM GOAL #3   Title Pt to demonstrate tolerance to 6MWT without need to sit during testing, performance >1041ft.    Baseline eval: tolerates less than 2 minutes of walking.  03/11/22: c 4WW AMB 29m30s without pain exacerbation (766ft)  04/04/22: c 4WW AMB 1207 ft without pain   Time 6    Period Weeks    Status MET     Target Date 05/13/21   PT LONG TERM GOAL #4  Title Pt to demonstrate tolerance to 6MWT without AD or need to sit during testing, performance >1066ft.   Baseline  04/04/22: Pt able to perform 1207 ft c 4WW  05/13/22: 940 ft w/o AD   Time 4  Period Weeks   Status NEW   Target Date 06/10/2022    PT LONG TERM GOAL #5  Title Pt to be able to get on Gazelle exercise machine at home and tolerate 15 minutes of constant mobility without increase in pain.  Baseline  05/13/22:  Pt unable to get on machine without pain.   Time 4  Period Weeks   Status NEW   Target Date 06/10/2022              Gwenlyn Saran, PT, DPT Physical Therapist- [facility]  05/20/22, 10:29 AM

## 2022-05-22 ENCOUNTER — Ambulatory Visit: Payer: Medicare HMO

## 2022-05-22 DIAGNOSIS — M6281 Muscle weakness (generalized): Secondary | ICD-10-CM

## 2022-05-22 DIAGNOSIS — M545 Low back pain, unspecified: Secondary | ICD-10-CM

## 2022-05-22 DIAGNOSIS — R42 Dizziness and giddiness: Secondary | ICD-10-CM

## 2022-05-22 DIAGNOSIS — M25551 Pain in right hip: Secondary | ICD-10-CM

## 2022-05-22 DIAGNOSIS — R2681 Unsteadiness on feet: Secondary | ICD-10-CM

## 2022-05-22 DIAGNOSIS — R262 Difficulty in walking, not elsewhere classified: Secondary | ICD-10-CM

## 2022-05-22 DIAGNOSIS — M5417 Radiculopathy, lumbosacral region: Secondary | ICD-10-CM

## 2022-05-22 DIAGNOSIS — G8929 Other chronic pain: Secondary | ICD-10-CM

## 2022-05-22 DIAGNOSIS — M5416 Radiculopathy, lumbar region: Secondary | ICD-10-CM

## 2022-05-22 NOTE — Therapy (Signed)
OUTPATIENT PHYSICAL THERAPY TREATMENT NOTE    Patient Name: Jessica Cole MRN: [id number] DOB:1951-01-24, 72 y.o., female Today's Date: 05/22/2022  PCP: Lynnea Ferrier, MD REFERRING PROVIDER: Tia Alert, MD   PT End of Session - 05/22/22 0854     Visit Number 21    Number of Visits 26   *corrected to reflect recent recertification   Date for PT Re-Evaluation 06/10/22    Authorization Type [employer]    Authorization Time Period 02/24/22-04/07/22    Progress Note Due on Visit 10    PT Start Time 0853    PT Stop Time 0930    PT Time Calculation (min) 37 min    Equipment Utilized During Treatment Gait belt    Activity Tolerance Patient tolerated treatment well;Patient limited by pain    Behavior During Therapy WFL for tasks assessed/performed                  Past Medical History:  Diagnosis Date   Anxiety    Asthma    seasonal with allergies   Colon polyps    Degenerative arthritis    Depression    Diabetes mellitus without complication (HCC)    Type II   Environmental allergies    Family history of adverse reaction to anesthesia    sister- PONV   Fatty liver    Glaucoma    Headache(784.0)    HX  MIGRAINES   History of bronchitis    History of kidney stones    History of pneumonia    Hypertension    Hypothyroidism    Obesity    OSA on CPAP    Plantar fasciitis    right   PONV (postoperative nausea and vomiting)    pt has never had any anesthesia complications, no post-op nausea or vomiting   Renal calculi    Renal calculi    Rheumatoid arthritis (HCC)    RLS (restless legs syndrome) 08/23/2014     Past Surgical History:  Procedure Laterality Date   ABDOMINAL HYSTERECTOMY     partial   APPENDECTOMY     Arthroscopic surgery, knee Left    BREAST BIOPSY Right    several   BREAST BIOPSY Right 03/11/2017   u/s bx neg   BUNIONECTOMY     LEFT    COLONOSCOPY W/ POLYPECTOMY     COLONOSCOPY WITH PROPOFOL N/A 08/14/2017   Procedure:  COLONOSCOPY WITH PROPOFOL;  Surgeon: Christena Deem, MD;  Location: [facility] ENDOSCOPY;  Service: Endoscopy;  Laterality: N/A;   EXTRACORPOREAL SHOCK WAVE LITHOTRIPSY Left 05/14/2017   Procedure: EXTRACORPOREAL SHOCK WAVE LITHOTRIPSY (ESWL);  Surgeon: Riki Altes, MD;  Location: [facility] ORS;  Service: Urology;  Laterality: Left;   EXTRACORPOREAL SHOCK WAVE LITHOTRIPSY Left 06/14/2020   Procedure: EXTRACORPOREAL SHOCK WAVE LITHOTRIPSY (ESWL);  Surgeon: Sondra Come, MD;  Location: [facility] ORS;  Service: Urology;  Laterality: Left;   FOOT SURGERY     RIGHT     KNEE ARTHROSCOPY W/ MENISCAL REPAIR Left    LASIK     LUMBAR LAMINECTOMY/DECOMPRESSION MICRODISCECTOMY Right 03/01/2021   Procedure: Microdiscectomy - Lumbar five-Sacral one - right;  Surgeon: Tia Alert, MD;  Location: [facility] OR;  Service: Neurosurgery;  Laterality: Right;   MAXIMUM ACCESS (MAS)POSTERIOR LUMBAR INTERBODY FUSION (PLIF) 1 LEVEL N/A 04/25/2016   Procedure: LUMBAR FOUR-FIVE  MAXIMUM ACCESS (MAS) POSTERIOR LUMBAR INTERBODY FUSION (PLIF) with extension of instrumentation LUMBAR TWO-FIVE;  Surgeon: Tia Alert, MD;  Location: [facility]  OR;  Service: Neurosurgery;  Laterality: N/A;   MAXIMUM ACCESS (MAS)POSTERIOR LUMBAR INTERBODY FUSION (PLIF) 2 LEVEL N/A 12/13/2015   Procedure: Lumbar two-three - Lumbar three-four MAXIMUM ACCESS (MAS) POSTERIOR LUMBAR INTERBODY FUSION (PLIF)  ;  Surgeon: Tia Alert, MD;  Location: [facility] NEURO ORS;  Service: Neurosurgery;  Laterality: N/A;   REVERSE SHOULDER ARTHROPLASTY Left 03/01/2020   Procedure: REVERSE SHOULDER ARTHROPLASTY;  Surgeon: Christena Flake, MD;  Location: [facility] ORS;  Service: Orthopedics;  Laterality: Left;   SHOULDER SURGERY     RIGHT    TONSILLECTOMY     Patient Active Problem List   Diagnosis Date Noted   S/P lumbar laminectomy 03/01/2021   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) 01/03/2019   Chronic  cough 11/19/2016   Morbid obesity (HCC) 09/26/2016   Left ovarian cyst 05/30/2016   S/P lumbar spinal fusion 12/13/2015   Aortic calcification (HCC) 07/18/2015   Controlled type 2 diabetes mellitus without complication (HCC) 05/16/2015   Thrombocytopenia (HCC) 05/16/2015   Recurrent major depressive disorder, in full remission (HCC) 05/16/2015   Essential (primary) hypertension 05/16/2015   Fatty infiltration of liver 03/15/2015   Aortic valve stenosis, nonrheumatic 01/18/2015   Sciatica of right side 01/09/2015   Type 2 diabetes mellitus (HCC) 11/10/2014   RLS (restless legs syndrome) 08/23/2014   Neuritis or radiculitis due to rupture of lumbar intervertebral disc 06/13/2014   Degeneration of intervertebral disc of lumbar region 06/13/2014   Lumbar radiculitis 06/13/2014   Arthritis 01/23/2014   Arthritis of knee, degenerative 11/15/2013   Depression 09/29/2013   Insomnia 09/29/2013   Apnea, sleep 08/29/2013   History of nephrolithiasis 08/29/2013   Abnormal presence of protein in urine 08/29/2013   Headache, migraine 08/29/2013   BP (high blood pressure) 08/29/2013   HLD (hyperlipidemia) 08/29/2013   Allergic rhinitis 08/29/2013   Intractable migraine without aura 02/18/2013    REFERRING DIAG: Radiculopathy, Lumbosacral Region  THERAPY DIAG:   Chronic right-sided low back pain, unspecified whether sciatica present   Radiculopathy, lumbosacral region   Difficulty in walking, not elsewhere classified  Muscle weakness (generalized)  Chronic bilateral low back pain, unspecified whether sciatica present  Dizziness and giddiness  Unsteadiness on feet  Difficulty in walking, not elsewhere classified  Chronic right-sided low back pain, unspecified whether sciatica present  Radiculopathy, lumbosacral region  Radiculopathy, lumbar region  Pain in right hip  Low back pain, unspecified back pain laterality, unspecified chronicity, unspecified whether sciatica  present  Rationale for Evaluation and Treatment Rehabilitation  PERTINENT HISTORY:  Pt reports chronic low back pain with radiation into the right hip and Rt posterior leg. She is a limited historian due to memory recall deficits. Lumbar surgery has been recommended, however insurance is mandating a 6 week trial of PT. Lumbar surgical history includes: PLIF L4-5, L2-08 Apr 2016, L2/3 & L3/07 Dec 2015, Rt L5-S1 Microdiskectomy/laminectomy 03/01/21  PRECAUTIONS: Fall Risk   SUBJECTIVE:   SUBJECTIVE STATEMENT:   Pt reports no new complaints.  Pt states her back is doing "ok", but woke up in the middle of the night with some sharp pain.      PAIN: Are you having pain? Yes: NPRS scale: 1.5/10 Pain location: B LE sciatic pain in the lumbar region going down to both LE's. Pain description: dull    TODAY'S TREATMENT:  TherEx:  Hooklying lower trunk rotations, 2x10 each direction with 3 sec holds Hooklying bridges, 2x10 Supine hamstring curls  with green physioball under feet, 2x10 Supine SLR, 2x10 Supine B hamstring stretch, SLR, 30 sec bouts with therapist applied overpressure Supine B hamstring stretch, bent knee, 30 sec bouts Supine B figure 4 stretch, 30 sec bouts with therapist applied overpressure Supine B piriformis stretch, 30 sec bouts with therapist applied overpressure Supine SKTC, 30 sec bouts with therapist applied overpressure Supine DKTC, 30 sec bouts with therapist applied overpressure Standing lumbar extension with UE support (forward facing wall with forearm support due to shoulder pain in the R shoulder), 30 sec bouts x3 Lateral walks with BTB resistance band placed around the distal thigh for increased resistance, 30' each direction x2 laps        PATIENT EDUCATION: Education details: Pt educated throughout session about proper posture and technique with exercises. Improved exercise technique, movement at target joints, use of target muscles after min to mod  verbal, visual, tactile cues.   Person educated: Patient Education method: Explanation, Tactile cues, and Verbal cues Education comprehension: verbalized understanding, returned demonstration, verbal cues required, and tactile cues required    HOME EXERCISE PROGRAM:   Access Code: [email or web address] URL: [email or web address] Date: 05/15/2022 Prepared by: Bibb Nation  Exercises - Supine Sciatic Nerve Glide  - 1 x daily - 7 x weekly - 3 sets - 10 reps - Supine Figure 4 Piriformis Stretch  - 1 x daily - 7 x weekly - 3 sets - 10 reps - Supine Piriformis Stretch with Leg Straight  - 1 x daily - 7 x weekly - 3 sets - 10 reps - Seated Piriformis Stretch  - 1 x daily - 7 x weekly - 3 sets - 10 reps - Seated Piriformis Stretch  - 1 x daily - 7 x weekly - 3 sets - 10 reps   Pt to continue HEP as previously given  Access Code: [email or web address] URL: [email or web address] Date: 04/04/2022 Prepared by: [place]  Exercises - Isometric Shoulder Flexion at Wall  - 1 x daily - 7 x weekly - 2 sets - 10 reps - 3 hold - Isometric Shoulder Extension at Wall  - 1 x daily - 7 x weekly - 2 sets - 10 reps - 3 hold - Isometric Shoulder Abduction at Wall  - 1 x daily - 7 x weekly - 2 sets - 10 reps - 3 hold - Isometric Shoulder Adduction  - 1 x daily - 7 x weekly - 2 sets - 10 reps - 3 hold    Plan - 02/26/22 0855     Clinical Impression Statement Pt is making limited improvement in the lumbar spine and lack of compliance with HEP could be contributing to the lack in progress.  Pt notes a reduction in overall pain level upon arrival, but it is very inconsistent on a daily visit basis.  Pt is able to perform LE strengthening exercises with good technique and was advised to continue with performing those as part of HEP as well.   Pt will continue to benefit from skilled therapy to address remaining deficits in order to improve overall QoL and return to PLOF.           Personal  Factors and Comorbidities Comorbidity 3+;Age;Fitness;Past/Current Experience;Time since onset of injury/illness/exacerbation    Comorbidities memory impairment, anxiety, depression, DM, hx of kidney stones, obesity, RA    Examination-Activity Limitations Bed Mobility;Stand;Lift;Locomotion Level;Bend;Transfers;Carry;Continence;Sit;Sleep;Stairs;Squat    Examination-Participation Restrictions Church;Meal Prep;Cleaning;Community Activity;Driving;Interpersonal Relationship;Laundry;Yard Work    Stability/Clinical Decision Making Evolving/Moderate complexity    Rehab Potential Poor  PT Frequency 2x / week    PT Duration 6 weeks    PT Treatment/Interventions Balance training;Neuromuscular re-education;Therapeutic activities;Patient/family education;Manual techniques;Therapeutic exercise;Electrical Stimulation;Cryotherapy;Moist Heat;Stair training;Functional mobility training;DME Instruction;Gait training;Passive range of motion;Dry needling    PT Next Visit Plan Continue with work toward goal achievement    PT Home Exercise Plan Access Code: [email or web address] URL: [email or web address] Date: 03/06/2022 Prepared by: Rebbeca Paul  Exercises - Supine Hip Adduction Isometric with Ball  - 1 x daily - 5 x weekly - 3 sets - 15 reps - 3 seconds hold - Sit to Stand with Counter Support  - 1 x daily - 5 x weekly - 3 sets - 10 reps - Seated Long Arc Quad  - 1 x daily - 5 x weekly - 3 sets - 10 reps - 3 hold - Side Stepping with Counter Support  - 1 x daily - 5 x weekly - 3 sets - 20 reps   Consulted and Agree with Plan of Care Patient                 PT Short Term Goals - TARGET DATE 04/29/2022        PT SHORT TERM GOAL #1   Title Pt will be independent with her initial HEP to decrease pain, improve strength, function, and ability to perform transfers more comfortably and with less difficulty.  03/11/22: limited by ST memory deficits;    Time 4   Period Weeks    Status Ongoing    Target  Date       PT SHORT TERM GOAL #2   Title Pt will demonstrate improved tolerance to overground AMB by> 579ft without pain limitation:    Baseline eval: <329ft; 03/11/22: 461ft without pain exacerbation, no AD, then 790ft c 4WW without pain exacerbation;  04/04/22: 1200 ft without pain   Time 4   Period Weeks    Status MET   Target Date       PT SHORT TERM GOAL #3   Title Pt to demonstrate improved tolerance to power activities AEB 5xSTS <15 secs    Baseline Eval: 19sec; 03/11/22: 11.45sec; 04/04/22: 11.63 sec   Time 4   Period Weeks    Status MET   Target Date 03/18/22                                     PT SHORT TERM GOAL #4  Title Pt will understand precautions, including post-maneuver precautions, and technique with canalith repositioning maneuver in order to reduce dizziness and decrease fall risk  Baseline 11/28: pt verbalized understanding for all, successfully completes maneuver in session with PT  Time 4  Period Weeks   Status MET  Target Date         PT Long Term Goals - TARGET DATE 05/13/2022       PT LONG TERM GOAL #1   Title Pt to improve score on FOTO survery by >8 points to indicate improved self efficicacy in ADL/IADL mobility.    Baseline Eval: 40; 04/04/22: 52   Time 6    Period Weeks    Status MET    Target Date 05/13/22     PT LONG TERM GOAL #2   Title Pt to demonstrate improved standing tolerance at home to >20 minutes.    Baseline eval: 5-10 minutes;  04/04/22: 30 minutes 05/13/22: 10 minutes   Time 6    Period  Weeks    Status Ongoing    Target Date 06/10/22     PT LONG TERM GOAL #3   Title Pt to demonstrate tolerance to without need to sit during testing, performance >1035ft.    Baseline eval: tolerates less than 2 minutes of walking.  03/11/22: c 4WW AMB 67m30s without pain exacerbation (735ft)  04/04/22: c 4WW AMB 1207 ft without pain   Time 6    Period Weeks    Status MET    Target Date 05/13/21   PT LONG TERM GOAL #4  Title Pt to  demonstrate tolerance to without AD or need to sit during testing, performance >1069ft.   Baseline  04/04/22: Pt able to perform 1207 ft c 4WW  05/13/22: 940 ft w/o AD   Time 4  Period Weeks   Status NEW   Target Date 06/10/2022    PT LONG TERM GOAL #5  Title Pt to be able to get on Gazelle exercise machine at home and tolerate 15 minutes of constant mobility without increase in pain.  Baseline  05/13/22:  Pt unable to get on machine without pain.   Time 4  Period Weeks   Status NEW   Target Date 06/10/2022              Nolon Bussing, PT, DPT Physical Therapist- [facility]  05/22/22, 8:54 AM

## 2022-05-27 ENCOUNTER — Other Ambulatory Visit: Payer: Self-pay | Admitting: Student

## 2022-05-27 ENCOUNTER — Ambulatory Visit: Payer: Medicare HMO

## 2022-05-27 DIAGNOSIS — M7581 Other shoulder lesions, right shoulder: Secondary | ICD-10-CM

## 2022-05-27 DIAGNOSIS — M19011 Primary osteoarthritis, right shoulder: Secondary | ICD-10-CM

## 2022-05-29 ENCOUNTER — Ambulatory Visit: Payer: Medicare HMO

## 2022-05-29 DIAGNOSIS — M5416 Radiculopathy, lumbar region: Secondary | ICD-10-CM

## 2022-05-29 DIAGNOSIS — M25612 Stiffness of left shoulder, not elsewhere classified: Secondary | ICD-10-CM

## 2022-05-29 DIAGNOSIS — R2681 Unsteadiness on feet: Secondary | ICD-10-CM

## 2022-05-29 DIAGNOSIS — R42 Dizziness and giddiness: Secondary | ICD-10-CM

## 2022-05-29 DIAGNOSIS — R262 Difficulty in walking, not elsewhere classified: Secondary | ICD-10-CM

## 2022-05-29 DIAGNOSIS — M6281 Muscle weakness (generalized): Secondary | ICD-10-CM | POA: Diagnosis not present

## 2022-05-29 DIAGNOSIS — M5417 Radiculopathy, lumbosacral region: Secondary | ICD-10-CM

## 2022-05-29 DIAGNOSIS — G8929 Other chronic pain: Secondary | ICD-10-CM

## 2022-05-29 DIAGNOSIS — M545 Low back pain, unspecified: Secondary | ICD-10-CM

## 2022-05-29 DIAGNOSIS — M25551 Pain in right hip: Secondary | ICD-10-CM

## 2022-05-29 NOTE — Therapy (Signed)
OUTPATIENT PHYSICAL THERAPY TREATMENT NOTE    Patient Name: Jessica Cole MRN: [id number] DOB:[birth year], 72 y.o., female Today's Date: 05/29/2022  PCP: Lynnea Ferrier, MD REFERRING PROVIDER: Tia Alert, MD   PT End of Session - 05/29/22 1021     Visit Number 22    Number of Visits 26   *corrected to reflect recent recertification   Date for PT Re-Evaluation 06/10/22    Authorization Type [employer]    Authorization Time Period 02/24/22-04/07/22    Progress Note Due on Visit 10    PT Start Time 1018    PT Stop Time 1100    PT Time Calculation (min) 42 min    Equipment Utilized During Treatment Gait belt    Activity Tolerance Patient tolerated treatment well;Patient limited by pain    Behavior During Therapy WFL for tasks assessed/performed                   Past Medical History:  Diagnosis Date   Anxiety    Asthma    seasonal with allergies   Colon polyps    Degenerative arthritis    Depression    Diabetes mellitus without complication (HCC)    Type II   Environmental allergies    Family history of adverse reaction to anesthesia    sister- PONV   Fatty liver    Glaucoma    Headache(784.0)    HX  MIGRAINES   History of bronchitis    History of kidney stones    History of pneumonia    Hypertension    Hypothyroidism    Obesity    OSA on CPAP    Plantar fasciitis    right   PONV (postoperative nausea and vomiting)    pt has never had any anesthesia complications, no post-op nausea or vomiting   Renal calculi    Renal calculi    Rheumatoid arthritis (HCC)    RLS (restless legs syndrome) 08/23/2014     Past Surgical History:  Procedure Laterality Date   ABDOMINAL HYSTERECTOMY     partial   APPENDECTOMY     Arthroscopic surgery, knee Left    BREAST BIOPSY Right    several   BREAST BIOPSY Right 03/11/2017   u/s bx neg   BUNIONECTOMY     LEFT    COLONOSCOPY W/ POLYPECTOMY     COLONOSCOPY WITH PROPOFOL N/A 08/14/2017   Procedure:  COLONOSCOPY WITH PROPOFOL;  Surgeon: Christena Deem, MD;  Location: [facility] ENDOSCOPY;  Service: Endoscopy;  Laterality: N/A;   EXTRACORPOREAL SHOCK WAVE LITHOTRIPSY Left 05/14/2017   Procedure: EXTRACORPOREAL SHOCK WAVE LITHOTRIPSY (ESWL);  Surgeon: Riki Altes, MD;  Location: [facility] ORS;  Service: Urology;  Laterality: Left;   EXTRACORPOREAL SHOCK WAVE LITHOTRIPSY Left 06/14/2020   Procedure: EXTRACORPOREAL SHOCK WAVE LITHOTRIPSY (ESWL);  Surgeon: Sondra Come, MD;  Location: [facility] ORS;  Service: Urology;  Laterality: Left;   FOOT SURGERY     RIGHT     KNEE ARTHROSCOPY W/ MENISCAL REPAIR Left    LASIK     LUMBAR LAMINECTOMY/DECOMPRESSION MICRODISCECTOMY Right 03/01/2021   Procedure: Microdiscectomy - Lumbar five-Sacral one - right;  Surgeon: Tia Alert, MD;  Location: [facility] OR;  Service: Neurosurgery;  Laterality: Right;   MAXIMUM ACCESS (MAS)POSTERIOR LUMBAR INTERBODY FUSION (PLIF) 1 LEVEL N/A 04/25/2016   Procedure: LUMBAR FOUR-FIVE  MAXIMUM ACCESS (MAS) POSTERIOR LUMBAR INTERBODY FUSION (PLIF) with extension of instrumentation LUMBAR TWO-FIVE;  Surgeon: Tia Alert, MD;  Location:  [facility] OR;  Service: Neurosurgery;  Laterality: N/A;   MAXIMUM ACCESS (MAS)POSTERIOR LUMBAR INTERBODY FUSION (PLIF) 2 LEVEL N/A 12/13/2015   Procedure: Lumbar two-three - Lumbar three-four MAXIMUM ACCESS (MAS) POSTERIOR LUMBAR INTERBODY FUSION (PLIF)  ;  Surgeon: Tia Alert, MD;  Location: [facility] NEURO ORS;  Service: Neurosurgery;  Laterality: N/A;   REVERSE SHOULDER ARTHROPLASTY Left 03/01/2020   Procedure: REVERSE SHOULDER ARTHROPLASTY;  Surgeon: Christena Flake, MD;  Location: [facility] ORS;  Service: Orthopedics;  Laterality: Left;   SHOULDER SURGERY     RIGHT    TONSILLECTOMY     Patient Active Problem List   Diagnosis Date Noted   S/P lumbar laminectomy 03/01/2021   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) 01/03/2019   Chronic  cough 11/19/2016   Morbid obesity (HCC) 09/26/2016   Left ovarian cyst 05/30/2016   S/P lumbar spinal fusion 12/13/2015   Aortic calcification (HCC) 07/18/2015   Controlled type 2 diabetes mellitus without complication (HCC) 05/16/2015   Thrombocytopenia (HCC) 05/16/2015   Recurrent major depressive disorder, in full remission (HCC) 05/16/2015   Essential (primary) hypertension 05/16/2015   Fatty infiltration of liver 03/15/2015   Aortic valve stenosis, nonrheumatic 01/18/2015   Sciatica of right side 01/09/2015   Type 2 diabetes mellitus (HCC) 11/10/2014   RLS (restless legs syndrome) 08/23/2014   Neuritis or radiculitis due to rupture of lumbar intervertebral disc 06/13/2014   Degeneration of intervertebral disc of lumbar region 06/13/2014   Lumbar radiculitis 06/13/2014   Arthritis 01/23/2014   Arthritis of knee, degenerative 11/15/2013   Depression 09/29/2013   Insomnia 09/29/2013   Apnea, sleep 08/29/2013   History of nephrolithiasis 08/29/2013   Abnormal presence of protein in urine 08/29/2013   Headache, migraine 08/29/2013   BP (high blood pressure) 08/29/2013   HLD (hyperlipidemia) 08/29/2013   Allergic rhinitis 08/29/2013   Intractable migraine without aura 02/18/2013    REFERRING DIAG: Radiculopathy, Lumbosacral Region  THERAPY DIAG:   Chronic right-sided low back pain, unspecified whether sciatica present   Radiculopathy, lumbosacral region   Difficulty in walking, not elsewhere classified  Muscle weakness (generalized)  Chronic bilateral low back pain, unspecified whether sciatica present  Dizziness and giddiness  Unsteadiness on feet  Difficulty in walking, not elsewhere classified  Chronic right-sided low back pain, unspecified whether sciatica present  Radiculopathy, lumbosacral region  Radiculopathy, lumbar region  Pain in right hip  Low back pain, unspecified back pain laterality, unspecified chronicity, unspecified whether sciatica  present  Chronic left shoulder pain  Stiffness of left shoulder, not elsewhere classified  Rationale for Evaluation and Treatment Rehabilitation  PERTINENT HISTORY:  Pt reports chronic low back pain with radiation into the right hip and Rt posterior leg. She is a limited historian due to memory recall deficits. Lumbar surgery has been recommended, however insurance is mandating a 6 week trial of PT. Lumbar surgical history includes: PLIF L4-5, L2-08 Apr 2016, L2/3 & L3/07 Dec 2015, Rt L5-S1 Microdiskectomy/laminectomy 03/01/21  PRECAUTIONS: Fall Risk   SUBJECTIVE:   SUBJECTIVE STATEMENT:   Pt reports her arm is hurting today.  She states that her back is doing much better today, other than a specific spot that causes her pain while sleeping.  Pt notes that when walking around for longer periods of times, her legs hurt.   PAIN: Are you having pain? Yes: NPRS scale: 1/10 Pain location: Arm and low back Pain description: Dull    TODAY'S TREATMENT:  TherEx:  Hooklying lower trunk rotations, 2x10 each direction with 3 sec holds Hooklying bridges, 2x10 Supine hamstring curls with green physioball under feet, 1x15 Supine SLR, 1x15 Supine B hamstring stretch, SLR, 30 sec bouts with therapist applied overpressure Supine B hamstring stretch, bent knee, 30 sec bouts Supine B figure 4 stretch, 30 sec bouts with therapist applied overpressure Supine B piriformis stretch, 30 sec bouts with therapist applied overpressure Supine SKTC, 30 sec bouts with therapist applied overpressure Supine DKTC, 30 sec bouts with therapist applied overpressure Standing lumbar extension with UE support (forward facing wall with forearm support due to shoulder pain in the R shoulder), 30 sec bouts x3 Lateral walks with BTB resistance band placed around the distal thigh for increased resistance, 30' each direction x2 laps Supine bridges with glute and TrA activation prior to lift off, x15 Supine SLR, x15  each Le Supine hip adduction into physioball, 2x15 with 3 sec holds    PATIENT EDUCATION: Education details: Pt educated throughout session about proper posture and technique with exercises. Improved exercise technique, movement at target joints, use of target muscles after min to mod verbal, visual, tactile cues.   Person educated: Patient Education method: Explanation, Tactile cues, and Verbal cues Education comprehension: verbalized understanding, returned demonstration, verbal cues required, and tactile cues required    HOME EXERCISE PROGRAM:   Access Code: [email or web address] URL: [email or web address] Date: 05/15/2022 Prepared by: Malvern Nation  Exercises - Supine Sciatic Nerve Glide  - 1 x daily - 7 x weekly - 3 sets - 10 reps - Supine Figure 4 Piriformis Stretch  - 1 x daily - 7 x weekly - 3 sets - 10 reps - Supine Piriformis Stretch with Leg Straight  - 1 x daily - 7 x weekly - 3 sets - 10 reps - Seated Piriformis Stretch  - 1 x daily - 7 x weekly - 3 sets - 10 reps - Seated Piriformis Stretch  - 1 x daily - 7 x weekly - 3 sets - 10 reps   Pt to continue HEP as previously given  Access Code: [email or web address] URL: [email or web address] Date: 04/04/2022 Prepared by: Ellsworth Nation  Exercises - Isometric Shoulder Flexion at Wall  - 1 x daily - 7 x weekly - 2 sets - 10 reps - 3 hold - Isometric Shoulder Extension at Wall  - 1 x daily - 7 x weekly - 2 sets - 10 reps - 3 hold - Isometric Shoulder Abduction at Wall  - 1 x daily - 7 x weekly - 2 sets - 10 reps - 3 hold - Isometric Shoulder Adduction  - 1 x daily - 7 x weekly - 2 sets - 10 reps - 3 hold    Plan - 02/26/22 0855     Clinical Impression Statement  Pt responded well to the exercises, noting only minimal increase in pain of the lumbar region with the bridging.  Pt noted a reduction whenever properly performing with TrA activation and glute squeeze prior to liftoff. Pt continued to progress through core  and hip strengthening exercises as part of current POC to improve lumbar stability and reduction of pain.   Pt will continue to benefit from skilled therapy to address remaining deficits in order to improve overall QoL and return to PLOF.         Personal Factors and Comorbidities Comorbidity 3+;Age;Fitness;Past/Current Experience;Time since onset of injury/illness/exacerbation    Comorbidities memory impairment, anxiety, depression, DM, hx of kidney stones, obesity, RA    Examination-Activity  Limitations Bed Mobility;Stand;Lift;Locomotion Level;Bend;Transfers;Carry;Continence;Sit;Sleep;Stairs;Squat    Examination-Participation Restrictions Church;Meal Prep;Cleaning;Community Activity;Driving;Interpersonal Relationship;Laundry;Yard Work    [employer] Evolving/Moderate complexity    Rehab Potential Poor    PT Frequency 2x / week    PT Duration 6 weeks    PT Treatment/Interventions Balance training;Neuromuscular re-education;Therapeutic activities;Patient/family education;Manual techniques;Therapeutic exercise;Electrical Stimulation;Cryotherapy;Moist Heat;Stair training;Functional mobility training;DME Instruction;Gait training;Passive range of motion;Dry needling    PT Next Visit Plan Continue with work toward goal achievement    PT Home Exercise Plan Access Code: [email or web address] URL: [email or web address] Date: 03/06/2022 Prepared by: Rebbeca Paul  Exercises - Supine Hip Adduction Isometric with Ball  - 1 x daily - 5 x weekly - 3 sets - 15 reps - 3 seconds hold - Sit to Stand with Counter Support  - 1 x daily - 5 x weekly - 3 sets - 10 reps - Seated Long Arc Quad  - 1 x daily - 5 x weekly - 3 sets - 10 reps - 3 hold - Side Stepping with Counter Support  - 1 x daily - 5 x weekly - 3 sets - 20 reps   Consulted and Agree with Plan of Care Patient                 PT Short Term Goals - TARGET DATE 04/29/2022        PT SHORT TERM GOAL #1   Title Pt  will be independent with her initial HEP to decrease pain, improve strength, function, and ability to perform transfers more comfortably and with less difficulty.  03/11/22: limited by ST memory deficits;    Time 4   Period Weeks    Status Ongoing    Target Date       PT SHORT TERM GOAL #2   Title Pt will demonstrate improved tolerance to overground AMB by> 561ft without pain limitation:    Baseline eval: <32ft; 03/11/22: 45ft without pain exacerbation, no AD, then 756ft c 4WW without pain exacerbation;  04/04/22: 1200 ft without pain   Time 4   Period Weeks    Status MET   Target Date       PT SHORT TERM GOAL #3   Title Pt to demonstrate improved tolerance to power activities AEB 5xSTS <15 secs    Baseline Eval: 19sec; 03/11/22: 11.45sec; 04/04/22: 11.63 sec   Time 4   Period Weeks    Status MET   Target Date 03/18/22                                     PT SHORT TERM GOAL #4  Title Pt will understand precautions, including post-maneuver precautions, and technique with canalith repositioning maneuver in order to reduce dizziness and decrease fall risk  Baseline 11/28: pt verbalized understanding for all, successfully completes maneuver in session with PT  Time 4  Period Weeks   Status MET  Target Date         PT Long Term Goals - TARGET DATE 05/13/2022       PT LONG TERM GOAL #1   Title Pt to improve score on FOTO survery by >8 points to indicate improved self efficicacy in ADL/IADL mobility.    Baseline Eval: 40; 04/04/22: 52   Time 6    Period Weeks    Status MET    Target Date 05/13/22     PT LONG TERM GOAL #2   Title Pt to  demonstrate improved standing tolerance at home to >20 minutes.    Baseline eval: 5-10 minutes;  04/04/22: 30 minutes 05/13/22: 10 minutes   Time 6    Period Weeks    Status Ongoing    Target Date 06/10/22     PT LONG TERM GOAL #3   Title Pt to demonstrate tolerance to without need to sit during testing, performance >1077ft.     Baseline eval: tolerates less than 2 minutes of walking.  03/11/22: c 4WW AMB 88m30s without pain exacerbation (746ft)  04/04/22: c 4WW AMB 1207 ft without pain   Time 6    Period Weeks    Status MET    Target Date 05/13/21   PT LONG TERM GOAL #4  Title Pt to demonstrate tolerance to without AD or need to sit during testing, performance >1010ft.   Baseline  04/04/22: Pt able to perform 1207 ft c 4WW  05/13/22: 940 ft w/o AD   Time 4  Period Weeks   Status NEW   Target Date 06/10/2022    PT LONG TERM GOAL #5  Title Pt to be able to get on Gazelle exercise machine at home and tolerate 15 minutes of constant mobility without increase in pain.  Baseline  05/13/22:  Pt unable to get on machine without pain.   Time 4  Period Weeks   Status NEW   Target Date 06/10/2022              Nolon Bussing, PT, DPT Physical Therapist- [facility]  05/29/22, 10:22 AM

## 2022-05-30 ENCOUNTER — Ambulatory Visit: Payer: Medicare HMO

## 2022-05-30 DIAGNOSIS — R42 Dizziness and giddiness: Secondary | ICD-10-CM

## 2022-05-30 DIAGNOSIS — M6281 Muscle weakness (generalized): Secondary | ICD-10-CM | POA: Diagnosis not present

## 2022-05-30 DIAGNOSIS — G8929 Other chronic pain: Secondary | ICD-10-CM

## 2022-05-30 DIAGNOSIS — M5416 Radiculopathy, lumbar region: Secondary | ICD-10-CM

## 2022-05-30 DIAGNOSIS — R262 Difficulty in walking, not elsewhere classified: Secondary | ICD-10-CM

## 2022-05-30 DIAGNOSIS — M5417 Radiculopathy, lumbosacral region: Secondary | ICD-10-CM

## 2022-05-30 DIAGNOSIS — R2681 Unsteadiness on feet: Secondary | ICD-10-CM

## 2022-05-30 DIAGNOSIS — M25551 Pain in right hip: Secondary | ICD-10-CM

## 2022-05-30 DIAGNOSIS — M545 Low back pain, unspecified: Secondary | ICD-10-CM

## 2022-05-30 NOTE — Therapy (Signed)
OUTPATIENT PHYSICAL THERAPY TREATMENT NOTE    Patient Name: Jessica Cole MRN: [id number] DOB:06-02-1950, 72 y.o., female Today's Date: 05/30/2022  PCP: Adin Hector, MD REFERRING PROVIDER: Eustace Moore, MD   PT End of Session - 05/30/22 0850     Visit Number 23    Number of Visits 7   *corrected to reflect recent recertification   Date for PT Re-Evaluation 06/10/22    Authorization Type [employer]    Authorization Time Period 02/24/22-04/07/22    Progress Note Due on Visit 10    PT Start Time 0849    PT Stop Time 0924    PT Time Calculation (min) 35 min    Equipment Utilized During Treatment Gait belt    Activity Tolerance Patient tolerated treatment well;Patient limited by pain    Behavior During Therapy WFL for tasks assessed/performed                   Past Medical History:  Diagnosis Date   Anxiety    Asthma    seasonal with allergies   Colon polyps    Degenerative arthritis    Depression    Diabetes mellitus without complication ([place])    Type II   Environmental allergies    Family history of adverse reaction to anesthesia    sister- PONV   Fatty liver    Glaucoma    Headache(784.0)    HX  MIGRAINES   History of bronchitis    History of kidney stones    History of pneumonia    Hypertension    Hypothyroidism    Obesity    OSA on CPAP    Plantar fasciitis    right   PONV (postoperative nausea and vomiting)    pt has never had any anesthesia complications, no post-op nausea or vomiting   Renal calculi    Renal calculi    Rheumatoid arthritis (HCC)    RLS (restless legs syndrome) 08/23/2014     Past Surgical History:  Procedure Laterality Date   ABDOMINAL HYSTERECTOMY     partial   APPENDECTOMY     Arthroscopic surgery, knee Left    BREAST BIOPSY Right    several   BREAST BIOPSY Right 03/11/2017   u/s bx neg   BUNIONECTOMY     LEFT    COLONOSCOPY W/ POLYPECTOMY     COLONOSCOPY WITH PROPOFOL N/A 08/14/2017   Procedure:  COLONOSCOPY WITH PROPOFOL;  Surgeon: Lollie Sails, MD;  Location: [facility] ENDOSCOPY;  Service: Endoscopy;  Laterality: N/A;   EXTRACORPOREAL SHOCK WAVE LITHOTRIPSY Left 05/14/2017   Procedure: EXTRACORPOREAL SHOCK WAVE LITHOTRIPSY (ESWL);  Surgeon: Abbie Sons, MD;  Location: [facility] ORS;  Service: Urology;  Laterality: Left;   EXTRACORPOREAL SHOCK WAVE LITHOTRIPSY Left 06/14/2020   Procedure: EXTRACORPOREAL SHOCK WAVE LITHOTRIPSY (ESWL);  Surgeon: Billey Co, MD;  Location: [facility] ORS;  Service: Urology;  Laterality: Left;   FOOT SURGERY     RIGHT     KNEE ARTHROSCOPY W/ MENISCAL REPAIR Left    LASIK     LUMBAR LAMINECTOMY/DECOMPRESSION MICRODISCECTOMY Right 03/01/2021   Procedure: Microdiscectomy - Lumbar five-Sacral one - right;  Surgeon: Eustace Moore, MD;  Location: [place];  Service: Neurosurgery;  Laterality: Right;   MAXIMUM ACCESS (MAS)POSTERIOR LUMBAR INTERBODY FUSION (PLIF) 1 LEVEL N/A 04/25/2016   Procedure: LUMBAR FOUR-FIVE  MAXIMUM ACCESS (MAS) POSTERIOR LUMBAR INTERBODY FUSION (PLIF) with extension of instrumentation LUMBAR TWO-FIVE;  Surgeon: Eustace Moore, MD;  Location:  [place] [facility];  Service: Neurosurgery;  Laterality: N/A;   MAXIMUM ACCESS (MAS)POSTERIOR LUMBAR INTERBODY FUSION (PLIF) 2 LEVEL N/A 12/13/2015   Procedure: Lumbar two-three - Lumbar three-four MAXIMUM ACCESS (MAS) POSTERIOR LUMBAR INTERBODY FUSION (PLIF)  ;  Surgeon: Eustace Moore, MD;  Location: [facility] NEURO ORS;  Service: Neurosurgery;  Laterality: N/A;   REVERSE SHOULDER ARTHROPLASTY Left 03/01/2020   Procedure: REVERSE SHOULDER ARTHROPLASTY;  Surgeon: Corky Mull, MD;  Location: [facility] ORS;  Service: Orthopedics;  Laterality: Left;   SHOULDER SURGERY     RIGHT    TONSILLECTOMY     Patient Active Problem List   Diagnosis Date Noted   S/P lumbar laminectomy 03/01/2021   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 edema associated with type 2 diabetes mellitus ([place]) 01/03/2019   Chronic  cough 11/19/2016   Morbid obesity ([place]) 09/26/2016   Left ovarian cyst 05/30/2016   S/P lumbar spinal fusion 12/13/2015   Aortic calcification ([place]) 07/18/2015   Controlled type 2 diabetes mellitus without complication ([place]) [id number]   Thrombocytopenia ([place]) 05/16/2015   Recurrent major depressive disorder, in full remission ([place]) 05/16/2015   Essential (primary) hypertension 05/16/2015   Fatty infiltration of liver 03/15/2015   Aortic valve stenosis, nonrheumatic 01/18/2015   Sciatica of right side 01/09/2015   Type 2 diabetes mellitus ([place]) 11/10/2014   RLS (restless legs syndrome) 08/23/2014   Neuritis or radiculitis due to rupture of lumbar intervertebral disc 06/13/2014   Degeneration of intervertebral disc of lumbar region 06/13/2014   Lumbar radiculitis 06/13/2014   Arthritis 01/23/2014   Arthritis of knee, degenerative 11/15/2013   Depression 09/29/2013   Insomnia 09/29/2013   Apnea, sleep 08/29/2013   History of nephrolithiasis 08/29/2013   Abnormal presence of protein in urine 08/29/2013   Headache, migraine 08/29/2013   BP (high blood pressure) 08/29/2013   HLD (hyperlipidemia) 08/29/2013   Allergic rhinitis 08/29/2013   Intractable migraine without aura 02/18/2013    REFERRING DIAG: Radiculopathy, Lumbosacral Region  THERAPY DIAG:   Chronic right-sided low back pain, unspecified whether sciatica present   Radiculopathy, lumbosacral region   Difficulty in walking, not elsewhere classified  Muscle weakness (generalized)  Chronic bilateral low back pain, unspecified whether sciatica present  Dizziness and giddiness  Unsteadiness on feet  Difficulty in walking, not elsewhere classified  Chronic right-sided low back pain, unspecified whether sciatica present  Radiculopathy, lumbosacral region  Radiculopathy, lumbar region  Pain in right hip  Low back pain, unspecified back pain laterality, unspecified chronicity, unspecified whether sciatica  present  Rationale for Evaluation and Treatment Rehabilitation  PERTINENT HISTORY:  Pt reports chronic low back pain with radiation into the right hip and Rt posterior leg. She is a limited historian due to memory recall deficits. Lumbar surgery has been recommended, however insurance is mandating a 6 week trial of PT. Lumbar surgical history includes: PLIF L4-5, L2-08 Apr 2016, L2/3 & L3/07 Dec 2015, Rt L5-S1 Microdiskectomy/laminectomy 03/01/21  PRECAUTIONS: Fall Risk   SUBJECTIVE:   SUBJECTIVE STATEMENT:   Pt reports she is feeling "off" today and notes that she was debating on not coming.  Pt states she is a little nauseous and it may have been because she took her medication on an empty stomach.  Therapist offered graham cracker or other snack, but pt refused.  PAIN: Are you having pain? Yes: NPRS scale: 1/10 Pain location: Arm and low back Pain description: Dull    TODAY'S TREATMENT:  Neuro:  Orthostatic vitals assessed prior to treatment:  Seated: BP:  142/86 mmHg HR: 68 bpm  Standing: BP: 138/80 mmHg HR: 77 bpm  Standing +3 min: BP: 119/82 mmHg HR: 75  Ambulation in hallway with 4# AW donned, for improved recruitment of muscle fibers Static stance on airex pad, 30 sec bouts Static stance on airex pad with head nods, 30 sec bouts Static stance on airex pad with head turns, 30 sec bouts Static stance on airex pad with VOR 30 sec bouts Marches on airex pad, 30 sec bouts    PATIENT EDUCATION: Education details: Pt educated throughout session about proper posture and technique with exercises. Improved exercise technique, movement at target joints, use of target muscles after min to mod verbal, visual, tactile cues.   Person educated: Patient Education method: Explanation, Tactile cues, and Verbal cues Education comprehension: verbalized understanding, returned demonstration, verbal cues required, and tactile cues required    HOME EXERCISE PROGRAM:    Access Code: [email or web address] URL: [email or web address] Date: 05/15/2022 Prepared by: [place]  Exercises - Supine Sciatic Nerve Glide  - 1 x daily - 7 x weekly - 3 sets - 10 reps - Supine Figure 4 Piriformis Stretch  - 1 x daily - 7 x weekly - 3 sets - 10 reps - Supine Piriformis Stretch with Leg Straight  - 1 x daily - 7 x weekly - 3 sets - 10 reps - Seated Piriformis Stretch  - 1 x daily - 7 x weekly - 3 sets - 10 reps - Seated Piriformis Stretch  - 1 x daily - 7 x weekly - 3 sets - 10 reps   Pt to continue HEP as previously given  Access Code: [email or web address] URL: [email or web address] Date: 04/04/2022 Prepared by: [place]  Exercises - Isometric Shoulder Flexion at Wall  - 1 x daily - 7 x weekly - 2 sets - 10 reps - 3 hold - Isometric Shoulder Extension at Wall  - 1 x daily - 7 x weekly - 2 sets - 10 reps - 3 hold - Isometric Shoulder Abduction at Wall  - 1 x daily - 7 x weekly - 2 sets - 10 reps - 3 hold - Isometric Shoulder Adduction  - 1 x daily - 7 x weekly - 2 sets - 10 reps - 3 hold    Plan - 02/26/22 0855     Clinical Impression Statement Session limited due to pt feeling dizzy and nauseous throughout the session.  Pt did experience a 19 mmHg drop in systolic BP after standing for 3 minutes.  Pt may need to be assessed for full orthostatics including supine position.  Pt also requesting to see vestibular therapist as she feels as though her crystals have been misplaced again.  Pt still wanting to continue with current POC and d/c on 06/10/22.         Personal Factors and Comorbidities Comorbidity 3+;Age;Fitness;Past/Current Experience;Time since onset of injury/illness/exacerbation    Comorbidities memory impairment, anxiety, depression, DM, hx of kidney stones, obesity, RA    Examination-Activity Limitations Bed Mobility;Stand;Lift;Locomotion Level;Bend;Transfers;Carry;Continence;Sit;Sleep;Stairs;Squat    Examination-Participation Restrictions  Church;Meal Prep;Cleaning;Community Activity;Driving;Interpersonal Relationship;Laundry;Yard Work    [employer] Evolving/Moderate complexity    Rehab Potential Poor    PT Frequency 2x / week    PT Duration 6 weeks    PT Treatment/Interventions Balance training;Neuromuscular re-education;Therapeutic activities;Patient/family education;Manual techniques;Therapeutic exercise;Electrical Stimulation;Cryotherapy;Moist Heat;Stair training;Functional mobility training;DME Instruction;Gait training;Passive range of motion;Dry needling    PT Next Visit Plan Continue with work toward goal achievement    PT Home Exercise  Plan Access Code: [email or web address] URL: [email or web address] Date: 03/06/2022 Prepared by: Rebbeca Paul  Exercises - Supine Hip Adduction Isometric with Ball  - 1 x daily - 5 x weekly - 3 sets - 15 reps - 3 seconds hold - Sit to Stand with Counter Support  - 1 x daily - 5 x weekly - 3 sets - 10 reps - Seated Long Arc Quad  - 1 x daily - 5 x weekly - 3 sets - 10 reps - 3 hold - Side Stepping with Counter Support  - 1 x daily - 5 x weekly - 3 sets - 20 reps   Consulted and Agree with Plan of Care Patient                 PT Short Term Goals - TARGET DATE 04/29/2022        PT SHORT TERM GOAL #1   Title Pt will be independent with her initial HEP to decrease pain, improve strength, function, and ability to perform transfers more comfortably and with less difficulty.  03/11/22: limited by ST memory deficits;    Time 4   Period Weeks    Status Ongoing    Target Date       PT SHORT TERM GOAL #2   Title Pt will demonstrate improved tolerance to overground AMB by> 572ft without pain limitation:    Baseline eval: <342ft; 03/11/22: 474ft without pain exacerbation, no AD, then 770ft c 4WW without pain exacerbation;  04/04/22: 1200 ft without pain   Time 4   Period Weeks    Status MET   Target Date       PT SHORT TERM GOAL #3   Title Pt to  demonstrate improved tolerance to power activities AEB 5xSTS <15 secs    Baseline Eval: 19sec; 03/11/22: 11.45sec; 04/04/22: 11.63 sec   Time 4   Period Weeks    Status MET   Target Date 03/18/22                                     PT SHORT TERM GOAL #4  Title Pt will understand precautions, including post-maneuver precautions, and technique with canalith repositioning maneuver in order to reduce dizziness and decrease fall risk  Baseline 11/28: pt verbalized understanding for all, successfully completes maneuver in session with PT  Time 4  Period Weeks   Status MET  Target Date         PT Long Term Goals - TARGET DATE 05/13/2022       PT LONG TERM GOAL #1   Title Pt to improve score on FOTO survery by >8 points to indicate improved self efficicacy in ADL/IADL mobility.    Baseline Eval: 40; 04/04/22: 52   Time 6    Period Weeks    Status MET    Target Date 05/13/22     PT LONG TERM GOAL #2   Title Pt to demonstrate improved standing tolerance at home to >20 minutes.    Baseline eval: 5-10 minutes;  04/04/22: 30 minutes 05/13/22: 10 minutes   Time 6    Period Weeks    Status Ongoing    Target Date 06/10/22     PT LONG TERM GOAL #3   Title Pt to demonstrate tolerance to 6MWT without need to sit during testing, performance >1055ft.    Baseline eval: tolerates less than 2 minutes of walking.  03/11/22: c 4QA  AMB 40m30s without pain exacerbation (754ft)  04/04/22: c 4WW AMB 1207 ft without pain   Time 6    Period Weeks    Status MET    Target Date 05/13/21   PT LONG TERM GOAL #4  Title Pt to demonstrate tolerance to without AD or need to sit during testing, performance >1020ft.   Baseline  04/04/22: Pt able to perform 1207 ft c 4WW  05/13/22: 940 ft w/o AD   Time 4  Period Weeks   Status NEW   Target Date 06/10/2022    PT LONG TERM GOAL #5  Title Pt to be able to get on Gazelle exercise machine at home and tolerate 15 minutes of constant mobility without increase in  pain.  Baseline  05/13/22:  Pt unable to get on machine without pain.   Time 4  Period Weeks   Status NEW   Target Date 06/10/2022              Nolon Bussing, PT, DPT Physical Therapist- [facility]  [facility]  05/30/22, 9:30 AM

## 2022-06-03 ENCOUNTER — Ambulatory Visit: Payer: Medicare HMO

## 2022-06-03 DIAGNOSIS — G8929 Other chronic pain: Secondary | ICD-10-CM

## 2022-06-03 DIAGNOSIS — M5417 Radiculopathy, lumbosacral region: Secondary | ICD-10-CM

## 2022-06-03 DIAGNOSIS — M5416 Radiculopathy, lumbar region: Secondary | ICD-10-CM

## 2022-06-03 DIAGNOSIS — R2681 Unsteadiness on feet: Secondary | ICD-10-CM

## 2022-06-03 DIAGNOSIS — M6281 Muscle weakness (generalized): Secondary | ICD-10-CM

## 2022-06-03 DIAGNOSIS — R262 Difficulty in walking, not elsewhere classified: Secondary | ICD-10-CM

## 2022-06-03 DIAGNOSIS — M545 Low back pain, unspecified: Secondary | ICD-10-CM

## 2022-06-03 DIAGNOSIS — M25551 Pain in right hip: Secondary | ICD-10-CM

## 2022-06-03 DIAGNOSIS — R42 Dizziness and giddiness: Secondary | ICD-10-CM

## 2022-06-03 NOTE — Therapy (Signed)
OUTPATIENT PHYSICAL THERAPY TREATMENT NOTE    Patient Name: Jessica Cole MRN: [id number] DOB:October 17, 1950, 72 y.o., female Today's Date: 06/03/2022  PCP: Adin Hector, MD REFERRING PROVIDER: Eustace Moore, MD   PT End of Session - 06/03/22 1523     Visit Number 24    Number of Visits 43   *corrected to reflect recent recertification   Date for PT Re-Evaluation 06/10/22    Authorization Type [employer]    Authorization Time Period 02/24/22-04/07/22    Progress Note Due on Visit 10    PT Start Time 1520    PT Stop Time 1600    PT Time Calculation (min) 40 min    Equipment Utilized During Treatment Gait belt    Activity Tolerance Patient tolerated treatment well;Patient limited by pain    Behavior During Therapy WFL for tasks assessed/performed              Past Medical History:  Diagnosis Date   Anxiety    Asthma    seasonal with allergies   Colon polyps    Degenerative arthritis    Depression    Diabetes mellitus without complication ([place])    Type II   Environmental allergies    Family history of adverse reaction to anesthesia    sister- PONV   Fatty liver    Glaucoma    Headache(784.0)    HX  MIGRAINES   History of bronchitis    History of kidney stones    History of pneumonia    Hypertension    Hypothyroidism    Obesity    OSA on CPAP    Plantar fasciitis    right   PONV (postoperative nausea and vomiting)    pt has never had any anesthesia complications, no post-op nausea or vomiting   Renal calculi    Renal calculi    Rheumatoid arthritis (HCC)    RLS (restless legs syndrome) 08/23/2014     Past Surgical History:  Procedure Laterality Date   ABDOMINAL HYSTERECTOMY     partial   APPENDECTOMY     Arthroscopic surgery, knee Left    BREAST BIOPSY Right    several   BREAST BIOPSY Right 03/11/2017   u/s bx neg   BUNIONECTOMY     LEFT    COLONOSCOPY W/ POLYPECTOMY     COLONOSCOPY WITH PROPOFOL N/A 08/14/2017   Procedure:  COLONOSCOPY WITH PROPOFOL;  Surgeon: Lollie Sails, MD;  Location: [facility] ENDOSCOPY;  Service: Endoscopy;  Laterality: N/A;   EXTRACORPOREAL SHOCK WAVE LITHOTRIPSY Left 05/14/2017   Procedure: EXTRACORPOREAL SHOCK WAVE LITHOTRIPSY (ESWL);  Surgeon: Abbie Sons, MD;  Location: [facility] ORS;  Service: Urology;  Laterality: Left;   EXTRACORPOREAL SHOCK WAVE LITHOTRIPSY Left 06/14/2020   Procedure: EXTRACORPOREAL SHOCK WAVE LITHOTRIPSY (ESWL);  Surgeon: Billey Co, MD;  Location: [facility] ORS;  Service: Urology;  Laterality: Left;   FOOT SURGERY     RIGHT     KNEE ARTHROSCOPY W/ MENISCAL REPAIR Left    LASIK     LUMBAR LAMINECTOMY/DECOMPRESSION MICRODISCECTOMY Right 03/01/2021   Procedure: Microdiscectomy - Lumbar five-Sacral one - right;  Surgeon: Eustace Moore, MD;  Location: [place];  Service: Neurosurgery;  Laterality: Right;   MAXIMUM ACCESS (MAS)POSTERIOR LUMBAR INTERBODY FUSION (PLIF) 1 LEVEL N/A 04/25/2016   Procedure: LUMBAR FOUR-FIVE  MAXIMUM ACCESS (MAS) POSTERIOR LUMBAR INTERBODY FUSION (PLIF) with extension of instrumentation LUMBAR TWO-FIVE;  Surgeon: Eustace Moore, MD;  Location: [place];  Service: Neurosurgery;  Laterality: N/A;   MAXIMUM ACCESS (MAS)POSTERIOR LUMBAR INTERBODY FUSION (PLIF) 2 LEVEL N/A 12/13/2015   Procedure: Lumbar two-three - Lumbar three-four MAXIMUM ACCESS (MAS) POSTERIOR LUMBAR INTERBODY FUSION (PLIF)  ;  Surgeon: Tia Alert, MD;  Location[facility]URO ORS;  Service: Neurosurgery;  Laterality: N/A;   REVERSE SHOULDER ARTHROPLASTY Left 03/01/2020   Procedure: REVERSE SHOULDER ARTHROPLASTY;  Surgeon: Christena Flake, MD;  Location: [facility] ORS;  Service: Orthopedics;  Laterality: Left;   SHOULDER SURGERY     RIGHT    TONSILLECTOMY     Patient Active Problem List   Diagnosis Date Noted   S/P lumbar laminectomy 03/01/2021   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) 01/03/2019   Chronic  cough 11/19/2016   Morbid obesity (HCC) 09/26/2016   Left ovarian cyst 05/30/2016   S/P lumbar spinal fusion 12/13/2015   Aortic calcification (HCC) 07/18/2015   Controlled type 2 diabetes mellitus without complication (HCC) 05/16/2015   Thrombocytopenia (HCC) 05/16/2015   Recurrent major depressive disorder, in full remission (HCC) 05/16/2015   Essential (primary) hypertension 05/16/2015   Fatty infiltration of liver 03/15/2015   Aortic valve stenosis, nonrheumatic 01/18/2015   Sciatica of right side 01/09/2015   Type 2 diabetes mellitus (HCC) 11/10/2014   RLS (restless legs syndrome) 08/23/2014   Neuritis or radiculitis due to rupture of lumbar intervertebral disc 06/13/2014   Degeneration of intervertebral disc of lumbar region 06/13/2014   Lumbar radiculitis 06/13/2014   Arthritis 01/23/2014   Arthritis of knee, degenerative 11/15/2013   Depression 09/29/2013   Insomnia 09/29/2013   Apnea, sleep 08/29/2013   History of nephrolithiasis 08/29/2013   Abnormal presence of protein in urine 08/29/2013   Headache, migraine 08/29/2013   BP (high blood pressure) 08/29/2013   HLD (hyperlipidemia) 08/29/2013   Allergic rhinitis 08/29/2013   Intractable migraine without aura 02/18/2013    REFERRING DIAG: Radiculopathy, Lumbosacral Region  THERAPY DIAG:   Chronic right-sided low back pain, unspecified whether sciatica present   Radiculopathy, lumbosacral region   Difficulty in walking, not elsewhere classified  Muscle weakness (generalized)  Chronic bilateral low back pain, unspecified whether sciatica present  Dizziness and giddiness  Unsteadiness on feet  Difficulty in walking, not elsewhere classified  Chronic right-sided low back pain, unspecified whether sciatica present  Radiculopathy, lumbosacral region  Radiculopathy, lumbar region  Pain in right hip  Low back pain, unspecified back pain laterality, unspecified chronicity, unspecified whether sciatica  present  Chronic left shoulder pain  Rationale for Evaluation and Treatment Rehabilitation  PERTINENT HISTORY:  Pt reports chronic low back pain with radiation into the right hip and Rt posterior leg. She is a limited historian due to memory recall deficits. Lumbar surgery has been recommended, however insurance is mandating a 6 week trial of PT. Lumbar surgical history includes: PLIF L4-5, L2-08 Apr 2016, L2/3 & L3/07 Dec 2015, Rt L5-S1 Microdiskectomy/laminectomy 03/01/21  PRECAUTIONS: Fall Risk   SUBJECTIVE:   SUBJECTIVE STATEMENT:   Pt reports she was out to eat with the girls today and fell asleep at the table while propped up on her arms.  Pt stated she did it again after waking up.  She notes that she was feeling a little sick on her stomach.     PAIN: Are you having pain? Yes: NPRS scale: 1/10 Pain location: Arm and low back Pain description: Dull    TODAY'S TREATMENT:  Neuro:  Orthostatic vitals assessed prior to treatment:  Seated: BP:  126/66 mmHg HR: 78 bpm  Standing: BP: 121/61 mmHg HR: 85 bpm  Standing +3 min: BP: 132/74 mmHg HR: 80  Staggered stance with leading LE on 6" step and trailing LE on airex pad, 60 sec bouts each LE x2 Staggered stance with leading LE on 6" step and trailing LE on airex pad, vertical head turns 2x10 each direction, with each LE leading Staggered stance with leading LE on 6" step and trailing LE on airex pad, vertical head turns 2x10 each direction, with each LE leading   TherEx:  Ambulation to the [place] and back (500' there and 500' back) with 2# AW donned on B LE STS x10 with 4" step under feet for increased hip flexion and difficulty, 2x10    PATIENT EDUCATION: Education details: Pt educated throughout session about proper posture and technique with exercises. Improved exercise technique, movement at target joints, use of target muscles after min to mod verbal, visual, tactile cues.   Person educated:  Patient Education method: Explanation, Tactile cues, and Verbal cues Education comprehension: verbalized understanding, returned demonstration, verbal cues required, and tactile cues required    HOME EXERCISE PROGRAM:   Access Code: [email or web address] URL: [email or web address] Date: 05/15/2022 Prepared by: Montague Nation  Exercises - Supine Sciatic Nerve Glide  - 1 x daily - 7 x weekly - 3 sets - 10 reps - Supine Figure 4 Piriformis Stretch  - 1 x daily - 7 x weekly - 3 sets - 10 reps - Supine Piriformis Stretch with Leg Straight  - 1 x daily - 7 x weekly - 3 sets - 10 reps - Seated Piriformis Stretch  - 1 x daily - 7 x weekly - 3 sets - 10 reps - Seated Piriformis Stretch  - 1 x daily - 7 x weekly - 3 sets - 10 reps   Pt to continue HEP as previously given  Access Code: [email or web address] URL: [email or web address] Date: 04/04/2022 Prepared by:  Nation  Exercises - Isometric Shoulder Flexion at Wall  - 1 x daily - 7 x weekly - 2 sets - 10 reps - 3 hold - Isometric Shoulder Extension at Wall  - 1 x daily - 7 x weekly - 2 sets - 10 reps - 3 hold - Isometric Shoulder Abduction at Wall  - 1 x daily - 7 x weekly - 2 sets - 10 reps - 3 hold - Isometric Shoulder Adduction  - 1 x daily - 7 x weekly - 2 sets - 10 reps - 3 hold    Plan - 02/26/22 0855     Clinical Impression Statement  Pt responded well to the exercises given today and performed well with the tasks given.  Pt notes that her blood pressure is much lower when at therapy session than when she is at home due to spouts with husband.  Pt was able to achieve ambulation to the [facility] and back (1000 ft total in <6 minutes) while wearing 2# AW.  Pt also improving on her stability while being on unstable surface.  Pt does still have some instances of imbalance when ambulating at times and scissoring, but is able to safely correct.  Pt and therapist agreeing on next pt being final session and for pt to be d/c at  that time.   Pt will continue to benefit from skilled therapy to address remaining deficits in order to improve overall QoL and return to PLOF.         Personal Factors  and Comorbidities Comorbidity 3+;Age;Fitness;Past/Current Experience;Time since onset of injury/illness/exacerbation    Comorbidities memory impairment, anxiety, depression, DM, hx of kidney stones, obesity, RA    Examination-Activity Limitations Bed Mobility;Stand;Lift;Locomotion Level;Bend;Transfers;Carry;Continence;Sit;Sleep;Stairs;Squat    Examination-Participation Restrictions Church;Meal Prep;Cleaning;Community Activity;Driving;Interpersonal Relationship;Laundry;Yard Work    [employer] Evolving/Moderate complexity    Rehab Potential Poor    PT Frequency 2x / week    PT Duration 6 weeks    PT Treatment/Interventions Balance training;Neuromuscular re-education;Therapeutic activities;Patient/family education;Manual techniques;Therapeutic exercise;Electrical Stimulation;Cryotherapy;Moist Heat;Stair training;Functional mobility training;DME Instruction;Gait training;Passive range of motion;Dry needling    PT Next Visit Plan Continue with work toward goal achievement    PT Home Exercise Plan Access Code: [email or web address] URL: [email or web address] Date: 03/06/2022 Prepared by: Rebbeca Paul  Exercises - Supine Hip Adduction Isometric with Ball  - 1 x daily - 5 x weekly - 3 sets - 15 reps - 3 seconds hold - Sit to Stand with Counter Support  - 1 x daily - 5 x weekly - 3 sets - 10 reps - Seated Long Arc Quad  - 1 x daily - 5 x weekly - 3 sets - 10 reps - 3 hold - Side Stepping with Counter Support  - 1 x daily - 5 x weekly - 3 sets - 20 reps   Consulted and Agree with Plan of Care Patient                 PT Short Term Goals - TARGET DATE 04/29/2022        PT SHORT TERM GOAL #1   Title Pt will be independent with her initial HEP to decrease pain, improve strength, function, and  ability to perform transfers more comfortably and with less difficulty.  03/11/22: limited by ST memory deficits;    Time 4   Period Weeks    Status Ongoing    Target Date       PT SHORT TERM GOAL #2   Title Pt will demonstrate improved tolerance to overground AMB by> 527ft without pain limitation:    Baseline eval: <357ft; 03/11/22: 418ft without pain exacerbation, no AD, then 733ft c 4WW without pain exacerbation;  04/04/22: 1200 ft without pain   Time 4   Period Weeks    Status MET   Target Date       PT SHORT TERM GOAL #3   Title Pt to demonstrate improved tolerance to power activities AEB 5xSTS <15 secs    Baseline Eval: 19sec; 03/11/22: 11.45sec; 04/04/22: 11.63 sec   Time 4   Period Weeks    Status MET   Target Date 03/18/22                                     PT SHORT TERM GOAL #4  Title Pt will understand precautions, including post-maneuver precautions, and technique with canalith repositioning maneuver in order to reduce dizziness and decrease fall risk  Baseline 11/28: pt verbalized understanding for all, successfully completes maneuver in session with PT  Time 4  Period Weeks   Status MET  Target Date         PT Long Term Goals - TARGET DATE 05/13/2022       PT LONG TERM GOAL #1   Title Pt to improve score on FOTO survery by >8 points to indicate improved self efficicacy in ADL/IADL mobility.    Baseline Eval: 40; 04/04/22: 52   Time 6  Period Weeks    Status MET    Target Date 05/13/22     PT LONG TERM GOAL #2   Title Pt to demonstrate improved standing tolerance at home to >20 minutes.    Baseline eval: 5-10 minutes;  04/04/22: 30 minutes 05/13/22: 10 minutes   Time 6    Period Weeks    Status Ongoing    Target Date 06/10/22     PT LONG TERM GOAL #3   Title Pt to demonstrate tolerance to 6MWT without need to sit during testing, performance >1070ft.    Baseline eval: tolerates less than 2 minutes of walking.  03/11/22: c 4WW AMB 61m30s without pain  exacerbation (771ft)  04/04/22: c 4WW AMB 1207 ft without pain   Time 6    Period Weeks    Status MET    Target Date 05/13/21   PT LONG TERM GOAL #4  Title Pt to demonstrate tolerance to 6MWT without AD or need to sit during testing, performance >1097ft.   Baseline  04/04/22: Pt able to perform 1207 ft c 4WW  05/13/22: 940 ft w/o AD   Time 4  Period Weeks   Status NEW   Target Date 06/10/2022    PT LONG TERM GOAL #5  Title Pt to be able to get on Gazelle exercise machine at home and tolerate 15 minutes of constant mobility without increase in pain.  Baseline  05/13/22:  Pt unable to get on machine without pain.   Time 4  Period Weeks   Status NEW   Target Date 06/10/2022              Gwenlyn Saran, PT, DPT Physical Therapist- [facility]  06/03/22, 5:04 PM

## 2022-06-05 ENCOUNTER — Ambulatory Visit: Payer: Medicare HMO

## 2022-06-06 ENCOUNTER — Ambulatory Visit: Payer: Medicare HMO | Attending: Neurological Surgery

## 2022-06-06 DIAGNOSIS — M25551 Pain in right hip: Secondary | ICD-10-CM | POA: Diagnosis present

## 2022-06-06 DIAGNOSIS — G8929 Other chronic pain: Secondary | ICD-10-CM | POA: Diagnosis present

## 2022-06-06 DIAGNOSIS — M5416 Radiculopathy, lumbar region: Secondary | ICD-10-CM | POA: Diagnosis present

## 2022-06-06 DIAGNOSIS — M545 Low back pain, unspecified: Secondary | ICD-10-CM | POA: Insufficient documentation

## 2022-06-06 DIAGNOSIS — M5417 Radiculopathy, lumbosacral region: Secondary | ICD-10-CM | POA: Insufficient documentation

## 2022-06-06 DIAGNOSIS — R262 Difficulty in walking, not elsewhere classified: Secondary | ICD-10-CM | POA: Insufficient documentation

## 2022-06-06 DIAGNOSIS — M6281 Muscle weakness (generalized): Secondary | ICD-10-CM | POA: Diagnosis present

## 2022-06-06 DIAGNOSIS — R2681 Unsteadiness on feet: Secondary | ICD-10-CM | POA: Diagnosis present

## 2022-06-06 DIAGNOSIS — R42 Dizziness and giddiness: Secondary | ICD-10-CM | POA: Diagnosis present

## 2022-06-06 NOTE — Therapy (Signed)
OUTPATIENT PHYSICAL THERAPY DISCHARGE NOTE    Patient Name: Jessica Cole MRN: [id number] DOB:10-30-1950, 72 y.o., female Today's Date: 06/06/2022  PCP: Adin Hector, MD REFERRING PROVIDER: Eustace Moore, MD   PT End of Session - 06/06/22 1010     Visit Number 25    Number of Visits 35   *corrected to reflect recent recertification   Date for PT Re-Evaluation 06/10/22    Authorization Type [employer]    Authorization Time Period 02/24/22-04/07/22    Progress Note Due on Visit 10    PT Start Time 1010    PT Stop Time 1030    PT Time Calculation (min) 20 min    Equipment Utilized During Treatment Gait belt    Activity Tolerance Patient tolerated treatment well;Patient limited by pain    Behavior During Therapy WFL for tasks assessed/performed              Past Medical History:  Diagnosis Date   Anxiety    Asthma    seasonal with allergies   Colon polyps    Degenerative arthritis    Depression    Diabetes mellitus without complication ([place])    Type II   Environmental allergies    Family history of adverse reaction to anesthesia    sister- PONV   Fatty liver    Glaucoma    Headache(784.0)    HX  MIGRAINES   History of bronchitis    History of kidney stones    History of pneumonia    Hypertension    Hypothyroidism    Obesity    OSA on CPAP    Plantar fasciitis    right   PONV (postoperative nausea and vomiting)    pt has never had any anesthesia complications, no post-op nausea or vomiting   Renal calculi    Renal calculi    Rheumatoid arthritis (HCC)    RLS (restless legs syndrome) 08/23/2014     Past Surgical History:  Procedure Laterality Date   ABDOMINAL HYSTERECTOMY     partial   APPENDECTOMY     Arthroscopic surgery, knee Left    BREAST BIOPSY Right    several   BREAST BIOPSY Right 03/11/2017   u/s bx neg   BUNIONECTOMY     LEFT    COLONOSCOPY W/ POLYPECTOMY     COLONOSCOPY WITH PROPOFOL N/A 08/14/2017   Procedure: COLONOSCOPY  WITH PROPOFOL;  Surgeon: Lollie Sails, MD;  Location: [facility] ENDOSCOPY;  Service: Endoscopy;  Laterality: N/A;   EXTRACORPOREAL SHOCK WAVE LITHOTRIPSY Left 05/14/2017   Procedure: EXTRACORPOREAL SHOCK WAVE LITHOTRIPSY (ESWL);  Surgeon: Abbie Sons, MD;  Location: [facility] ORS;  Service: Urology;  Laterality: Left;   EXTRACORPOREAL SHOCK WAVE LITHOTRIPSY Left 06/14/2020   Procedure: EXTRACORPOREAL SHOCK WAVE LITHOTRIPSY (ESWL);  Surgeon: Billey Co, MD;  Location: [facility] ORS;  Service: Urology;  Laterality: Left;   FOOT SURGERY     RIGHT     KNEE ARTHROSCOPY W/ MENISCAL REPAIR Left    LASIK     LUMBAR LAMINECTOMY/DECOMPRESSION MICRODISCECTOMY Right 03/01/2021   Procedure: Microdiscectomy - Lumbar five-Sacral one - right;  Surgeon: Eustace Moore, MD;  Location: [place];  Service: Neurosurgery;  Laterality: Right;   MAXIMUM ACCESS (MAS)POSTERIOR LUMBAR INTERBODY FUSION (PLIF) 1 LEVEL N/A 04/25/2016   Procedure: LUMBAR FOUR-FIVE  MAXIMUM ACCESS (MAS) POSTERIOR LUMBAR INTERBODY FUSION (PLIF) with extension of instrumentation LUMBAR TWO-FIVE;  Surgeon: Eustace Moore, MD;  Location: [place];  Service: Neurosurgery;  Laterality: N/A;   MAXIMUM ACCESS (MAS)POSTERIOR LUMBAR INTERBODY FUSION (PLIF) 2 LEVEL N/A 12/13/2015   Procedure: Lumbar two-three - Lumbar three-four MAXIMUM ACCESS (MAS) POSTERIOR LUMBAR INTERBODY FUSION (PLIF)  ;  Surgeon: Tia Alert, MD;  Location: [facility] NEURO ORS;  Service: Neurosurgery;  Laterality: N/A;   REVERSE SHOULDER ARTHROPLASTY Left 03/01/2020   Procedure: REVERSE SHOULDER ARTHROPLASTY;  Surgeon: Christena Flake, MD;  Location: [facility] ORS;  Service: Orthopedics;  Laterality: Left;   SHOULDER SURGERY     RIGHT    TONSILLECTOMY     Patient Active Problem List   Diagnosis Date Noted   S/P lumbar laminectomy 03/01/2021   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) 01/03/2019   Chronic cough  11/19/2016   Morbid obesity (HCC) 09/26/2016   Left ovarian cyst 05/30/2016   S/P lumbar spinal fusion 12/13/2015   Aortic calcification (HCC) 07/18/2015   Controlled type 2 diabetes mellitus without complication (HCC) 05/16/2015   Thrombocytopenia (HCC) 05/16/2015   Recurrent major depressive disorder, in full remission (HCC) 05/16/2015   Essential (primary) hypertension 05/16/2015   Fatty infiltration of liver 03/15/2015   Aortic valve stenosis, nonrheumatic 01/18/2015   Sciatica of right side 01/09/2015   Type 2 diabetes mellitus (HCC) 11/10/2014   RLS (restless legs syndrome) 08/23/2014   Neuritis or radiculitis due to rupture of lumbar intervertebral disc 06/13/2014   Degeneration of intervertebral disc of lumbar region 06/13/2014   Lumbar radiculitis 06/13/2014   Arthritis 01/23/2014   Arthritis of knee, degenerative 11/15/2013   Depression 09/29/2013   Insomnia 09/29/2013   Apnea, sleep 08/29/2013   History of nephrolithiasis 08/29/2013   Abnormal presence of protein in urine 08/29/2013   Headache, migraine 08/29/2013   BP (high blood pressure) 08/29/2013   HLD (hyperlipidemia) 08/29/2013   Allergic rhinitis 08/29/2013   Intractable migraine without aura 02/18/2013    REFERRING DIAG: Radiculopathy, Lumbosacral Region  THERAPY DIAG:   Chronic right-sided low back pain, unspecified whether sciatica present   Radiculopathy, lumbosacral region   Difficulty in walking, not elsewhere classified  Muscle weakness (generalized)  Chronic bilateral low back pain, unspecified whether sciatica present  Dizziness and giddiness  Unsteadiness on feet  Difficulty in walking, not elsewhere classified  Chronic right-sided low back pain, unspecified whether sciatica present  Radiculopathy, lumbosacral region  Radiculopathy, lumbar region  Pain in right hip  Low back pain, unspecified back pain laterality, unspecified chronicity, unspecified whether sciatica  present  Rationale for Evaluation and Treatment Rehabilitation  PERTINENT HISTORY:  Pt reports chronic low back pain with radiation into the right hip and Rt posterior leg. She is a limited historian due to memory recall deficits. Lumbar surgery has been recommended, however insurance is mandating a 6 week trial of PT. Lumbar surgical history includes: PLIF L4-5, L2-08 Apr 2016, L2/3 & L3/07 Dec 2015, Rt L5-S1 Microdiskectomy/laminectomy 03/01/21  PRECAUTIONS: Fall Risk   SUBJECTIVE:   SUBJECTIVE STATEMENT:   Pt reports she is getting over a migraine currently, but is doing ok otherwise.  Pt is ready to be d/c today.   PAIN: Are you having pain? Yes: NPRS scale: 1/10 Pain location: Arm and low back Pain description: Dull    TODAY'S TREATMENT:  Neuro:  Goal assessment performed an noted below:    PATIENT EDUCATION: Education details: Pt educated throughout session about proper posture and technique with exercises. Improved exercise technique, movement at target joints, use of target muscles after min to  mod verbal, visual, tactile cues.   Person educated: Patient Education method: Explanation, Tactile cues, and Verbal cues Education comprehension: verbalized understanding, returned demonstration, verbal cues required, and tactile cues required    HOME EXERCISE PROGRAM:   Access Code: [email or web address] URL: [email or web address] Date: 05/15/2022 Prepared by: Tomasa Hose  Exercises - Supine Sciatic Nerve Glide  - 1 x daily - 7 x weekly - 3 sets - 10 reps - Supine Figure 4 Piriformis Stretch  - 1 x daily - 7 x weekly - 3 sets - 10 reps - Supine Piriformis Stretch with Leg Straight  - 1 x daily - 7 x weekly - 3 sets - 10 reps - Seated Piriformis Stretch  - 1 x daily - 7 x weekly - 3 sets - 10 reps - Seated Piriformis Stretch  - 1 x daily - 7 x weekly - 3 sets - 10 reps   Pt to continue HEP as previously given  Access Code: [email or web address] URL:  [email or web address] Date: 04/04/2022 Prepared by: Tomasa Hose  Exercises - Isometric Shoulder Flexion at Wall  - 1 x daily - 7 x weekly - 2 sets - 10 reps - 3 hold - Isometric Shoulder Extension at Wall  - 1 x daily - 7 x weekly - 2 sets - 10 reps - 3 hold - Isometric Shoulder Abduction at Wall  - 1 x daily - 7 x weekly - 2 sets - 10 reps - 3 hold - Isometric Shoulder Adduction  - 1 x daily - 7 x weekly - 2 sets - 10 reps - 3 hold    Plan - 02/26/22 0855     Clinical Impression Statement  Pt has made significant progress towards goals and has been able to achieve 4/5 long term goals, with the ability to likely achieve the final goal, however she has not attempted at home yet.  Pt has progressed with therapy so well that she is putting off having a back surgery at this time, however will likely be having a RTC repair that she will be seeking therapy for in the future.  Pt advised and encouraged to reach out to the clinic if needed, and to report her finding to the clinic if at all possible.  Pt is d/c at this time.       Personal Factors and Comorbidities Comorbidity 3+;Age;Fitness;Past/Current Experience;Time since onset of injury/illness/exacerbation    Comorbidities memory impairment, anxiety, depression, DM, hx of kidney stones, obesity, RA    Examination-Activity Limitations Bed Mobility;Stand;Lift;Locomotion Level;Bend;Transfers;Carry;Continence;Sit;Sleep;Stairs;Squat    Examination-Participation Restrictions Church;Meal Prep;Cleaning;Community Activity;Driving;Interpersonal Relationship;Laundry;Yard Work    Conservation officer, historic buildings Evolving/Moderate complexity    Rehab Potential Poor    PT Frequency 2x / week    PT Duration 6 weeks    PT Treatment/Interventions Balance training;Neuromuscular re-education;Therapeutic activities;Patient/family education;Manual techniques;Therapeutic exercise;Electrical Stimulation;Cryotherapy;Moist Heat;Stair  training;Functional mobility training;DME Instruction;Gait training;Passive range of motion;Dry needling    PT Next Visit Plan Continue with work toward goal achievement    PT Home Exercise Plan Access Code: [email or web address] URL: [email or web address] Date: 03/06/2022 Prepared by: Alvera Novel  Exercises - Supine Hip Adduction Isometric with Ball  - 1 x daily - 5 x weekly - 3 sets - 15 reps - 3 seconds hold - Sit to Stand with Counter Support  - 1 x daily - 5 x weekly - 3 sets - 10 reps - Seated Long Arc Quad  - 1 x daily - 5 x weekly - 3 sets - 10 reps - 3 hold - Side Stepping  with Counter Support  - 1 x daily - 5 x weekly - 3 sets - 20 reps   Consulted and Agree with Plan of Care Patient                 PT Short Term Goals - TARGET DATE 04/29/2022        PT SHORT TERM GOAL #1   Title Pt will be independent with her initial HEP to decrease pain, improve strength, function, and ability to perform transfers more comfortably and with less difficulty.  03/11/22: limited by ST memory deficits;    Time 4   Period Weeks    Status Ongoing    Target Date       PT SHORT TERM GOAL #2   Title Pt will demonstrate improved tolerance to overground AMB by> 546ft without pain limitation:    Baseline eval: <321ft; 03/11/22: 472ft without pain exacerbation, no AD, then 778ft c 4WW without pain exacerbation;  04/04/22: 1200 ft without pain   Time 4   Period Weeks    Status MET   Target Date       PT SHORT TERM GOAL #3   Title Pt to demonstrate improved tolerance to power activities AEB 5xSTS <15 secs    Baseline Eval: 19sec; 03/11/22: 11.45sec; 04/04/22: 11.63 sec   Time 4   Period Weeks    Status MET   Target Date 03/18/22                                     PT SHORT TERM GOAL #4  Title Pt will understand precautions, including post-maneuver precautions, and technique with canalith repositioning maneuver in order to reduce dizziness and decrease fall risk  Baseline  11/28: pt verbalized understanding for all, successfully completes maneuver in session with PT  Time 4  Period Weeks   Status MET  Target Date         PT Long Term Goals - TARGET DATE 05/13/2022       PT LONG TERM GOAL #1   Title Pt to improve score on FOTO survery by >8 points to indicate improved self efficicacy in ADL/IADL mobility.    Baseline Eval: 40; 04/04/22: 52; 06/06/22: 59   Time 6    Period Weeks    Status MET    Target Date 05/13/22     PT LONG TERM GOAL #2   Title Pt to demonstrate improved standing tolerance at home to >20 minutes.    Baseline eval: 5-10 minutes;  04/04/22: 30 minutes 05/13/22: 10 minutes 06/06/22: 40 minutes   Time 6    Period Weeks    Status MET    Target Date 06/10/22     PT LONG TERM GOAL #3   Title Pt to demonstrate tolerance to 6MWT without need to sit during testing, performance >1093ft.    Baseline eval: tolerates less than 2 minutes of walking.  03/11/22: c 4WW AMB 50m30s without pain exacerbation (774ft)  04/04/22: c 4WW AMB 1207 ft without pain   Time 6    Period Weeks    Status MET    Target Date 05/13/21   PT LONG TERM GOAL #4  Title Pt to demonstrate tolerance to 6MWT without AD or need to sit during testing, performance >1097ft.   Baseline  04/04/22: Pt able to perform 1207 ft c 4WW  05/13/22: 940 ft w/o AD  06/06/22: 1140 ft  w/o AD  Time 4  Period Weeks   Status MET   Target Date 06/10/2022    PT LONG TERM GOAL #5  Title Pt to be able to get on Gazelle exercise machine at home and tolerate 15 minutes of constant mobility without increase in pain.  Baseline  05/13/22:  Pt unable to get on machine without pain.  06/06/22: Pt hasn't been on the gazelle yet due to it being cold outside.  Time 4  Period Weeks   Status IN PROGRESS   Target Date 06/10/2022              Gwenlyn Saran, PT, DPT Physical Therapist- [facility]  06/06/22, 10:34 AM

## 2022-06-07 ENCOUNTER — Other Ambulatory Visit: Payer: Medicare HMO

## 2022-06-09 ENCOUNTER — Ambulatory Visit
Admission: RE | Admit: 2022-06-09 | Discharge: 2022-06-09 | Disposition: A | Discharge status: Home / Self Care | Payer: Medicare HMO | Source: Ambulatory Visit | Attending: Student | Admitting: Student

## 2022-06-09 DIAGNOSIS — M7581 Other shoulder lesions, right shoulder: Secondary | ICD-10-CM

## 2022-06-09 DIAGNOSIS — M19011 Primary osteoarthritis, right shoulder: Secondary | ICD-10-CM

## 2022-06-10 ENCOUNTER — Ambulatory Visit: Payer: Medicare HMO

## 2022-06-13 ENCOUNTER — Ambulatory Visit: Payer: Medicare HMO

## 2022-06-16 ENCOUNTER — Ambulatory Visit: Payer: Medicare HMO

## 2022-06-18 ENCOUNTER — Other Ambulatory Visit: Payer: Self-pay | Admitting: Surgery

## 2022-06-19 ENCOUNTER — Ambulatory Visit: Payer: Medicare HMO

## 2022-06-24 ENCOUNTER — Other Ambulatory Visit: Payer: Self-pay

## 2022-06-24 ENCOUNTER — Encounter
Admission: RE | Admit: 2022-06-24 | Discharge: 2022-06-24 | Disposition: A | Discharge status: Home / Self Care | Payer: Medicare HMO | Source: Ambulatory Visit | Attending: Surgery | Admitting: Surgery

## 2022-06-24 VITALS — BP 120/73 | HR 82 | Resp 16 | Ht 59.0 in | Wt 179.0 lb

## 2022-06-24 DIAGNOSIS — Z01810 Encounter for preprocedural cardiovascular examination: Secondary | ICD-10-CM

## 2022-06-24 DIAGNOSIS — Z01812 Encounter for preprocedural laboratory examination: Secondary | ICD-10-CM

## 2022-06-24 DIAGNOSIS — Z01818 Encounter for other preprocedural examination: Secondary | ICD-10-CM | POA: Diagnosis not present

## 2022-06-24 HISTORY — DX: Migraine, unspecified, not intractable, without status migrainosus: G43.909

## 2022-06-24 HISTORY — DX: Pneumonia, unspecified organism: J18.9

## 2022-06-24 HISTORY — DX: Type 2 diabetes mellitus without complications: E11.9

## 2022-06-24 LAB — URINALYSIS, ROUTINE W REFLEX MICROSCOPIC
Bacteria, UA: NONE SEEN
Bilirubin Urine: NEGATIVE
Glucose, UA: >=500 mg/dL — AB
Hgb urine dipstick: NEGATIVE
Ketones, ur: NEGATIVE mg/dL
Leukocytes,Ua: NEGATIVE
Nitrite: NEGATIVE
Protein, ur: NEGATIVE mg/dL
Specific Gravity, Urine: 1.031 — ABNORMAL HIGH (ref 1.005–1.030)
pH: 5 (ref 5.0–8.0)

## 2022-06-24 LAB — COMPREHENSIVE METABOLIC PANEL
ALT: 19 U/L (ref 0–44)
AST: 19 U/L (ref 15–41)
Albumin: 4.5 g/dL (ref 3.5–5.0)
Alkaline Phosphatase: 47 U/L (ref 38–126)
Anion gap: 9 (ref 5–15)
BUN: 21 mg/dL (ref 8–23)
CO2: 25 mmol/L (ref 22–32)
Calcium: 9.5 mg/dL (ref 8.9–10.3)
Chloride: 103 mmol/L (ref 98–111)
Creatinine, Ser: 0.72 mg/dL (ref 0.44–1.00)
GFR, Estimated: >60 mL/min (ref >60)
Glucose, Bld: 102 mg/dL — ABNORMAL HIGH (ref 70–99)
Potassium: 4.4 mmol/L (ref 3.5–5.1)
Sodium: 137 mmol/L (ref 135–145)
Total Bilirubin: 0.8 mg/dL (ref 0.3–1.2)
Total Protein: 7.6 g/dL (ref 6.5–8.1)

## 2022-06-24 LAB — CBC WITH DIFFERENTIAL/PLATELET
Abs Immature Granulocytes: 0.01 10*3/uL (ref 0.00–0.07)
Basophils Absolute: 0.1 10*3/uL (ref 0.0–0.1)
Basophils Relative: 1 %
Eosinophils Absolute: 0.1 10*3/uL (ref 0.0–0.5)
Eosinophils Relative: 3 %
HCT: 43.1 % (ref 36.0–46.0)
Hemoglobin: 14.2 g/dL (ref 12.0–15.0)
Immature Granulocytes: 0 %
Lymphocytes Relative: 24 %
Lymphs Abs: 1.2 10*3/uL (ref 0.7–4.0)
MCH: 30 pg (ref 26.0–34.0)
MCHC: 32.9 g/dL (ref 30.0–36.0)
MCV: 90.9 fL (ref 80.0–100.0)
Monocytes Absolute: 0.4 10*3/uL (ref 0.1–1.0)
Monocytes Relative: 8 %
Neutro Abs: 3.3 10*3/uL (ref 1.7–7.7)
Neutrophils Relative %: 64 %
Platelets: 192 10*3/uL (ref 150–400)
RBC: 4.74 MIL/uL (ref 3.87–5.11)
RDW: 13 % (ref 11.5–15.5)
WBC: 5.1 10*3/uL (ref 4.0–10.5)
nRBC: 0 % (ref 0.0–0.2)

## 2022-06-24 LAB — TYPE AND SCREEN
ABO/RH(D): B POS
Antibody Screen: NEGATIVE

## 2022-06-24 LAB — SURGICAL PCR SCREEN
MRSA, PCR: POSITIVE — AB
Staphylococcus aureus: POSITIVE — AB

## 2022-06-24 NOTE — Progress Notes (Signed)
  Perioperative Services  Abnormal Lab Notification and Treatment Plan of Care  Date: 06/24/22  Name: Jessica Cole MRN:   [id number]  Re: Abnormal labs noted during PAT appointment  Provider Notified: Corky Mull, MD Notification mode: Routed and/or faxed via [place] of concern: Lab Results  Component Value Date   STAPHAUREUS POSITIVE (A) 06/24/2022   MRSAPCR POSITIVE (A) 06/24/2022    Notes: Patient is scheduled for a REVERSE SHOULDER ARTHROPLASTY W/ BICEPS TENODESIS. (Right: Shoulder) on 07/03/2022. She is scheduled to receive CEFAZOLIN pre-operatively. Surgical PCR (+) for MRSA; see above.  PLANS:  Review renal function. Estimated Creatinine Clearance: 57.7 mL/min (by C-G formula based on SCr of 0.72 mg/dL).  Review allergies. No documented allergy to vancomycin.  Order added for VANCOMYCIN 1 GRAM IV to current preoperative prophylactic regimen.   Patient with orders for both CEFAZOLIN + VANCOMYCIN to be given in the setting of documented MRSA (+) surgical PCR.   Guidelines suggest that a beta-lactam antibiotic (first or second generation cephalosporin) should be added for activity against gram-negative organisms.  Vancomycin appears to be less effective than cefazolin for preventing SSIs caused by MSSA. For this reason, the use of vancomycin in combination with cefazolin is favored for prevention of SSI due to MRSA and coagulase-negative staphylococci.  Medical history in [facility] updated to reflect (+) PCR result indicating nasal MRSA colonization   Honor Loh, MSN, APRN, FNP-C, CEN [facility]  Peri-operative Services Nurse Practitioner Phone: [phone] 06/24/22 2:58 PM

## 2022-06-24 NOTE — Patient Instructions (Addendum)
Your procedure is scheduled on: 07/03/22 - Thursday Report to the Registration Desk on the 1st floor of the [facility]. To find out your arrival time, please call [phone] between 1PM - 3PM on: 07/02/22 - Wednesday If your arrival time is 6:00 am, do not arrive before that time as the [place] entrance doors do not open until 6:00 am.  REMEMBER: Instructions that are not followed completely may result in serious medical risk, up to and including death; or upon the discretion of your surgeon and anesthesiologist your surgery may need to be rescheduled.  Do not eat food after midnight the night before surgery.  No gum chewing or hard candies.  You may however, drink CLEAR liquids up to 2 hours before you are scheduled to arrive for your surgery. Do not drink anything within 2 hours of your scheduled arrival time. Type 1 and Type 2 diabetics should only drink water.  In addition, your doctor has ordered for you to drink the provided:  Gatorade G2 Drinking this carbohydrate drink up to two hours before surgery helps to reduce insulin resistance and improve patient outcomes. Please complete drinking 2 hours before scheduled arrival time.  STOP taking One week prior to surgery: HOLD beginning 06/26/22:  meloxicam (MOBIC)  and Anti-inflammatories (NSAIDS) such as Advil, Aleve, Ibuprofen, Motrin, Naproxen, Naprosyn and Aspirin based products such as Excedrin, Goody's Powder, BC Powder.  You may take Tylenol if needed for pain up until the day of surgery.  Stop beginning 06/26/22 ANY OVER THE COUNTER supplements until after surgery.  Continue taking all prescribed medications with the exception of the following:  HOLD empagliflozin (JARDIANCE) beginning 06/30/22. Hold metFORMIN (GLUCOPHAGE) beginning 07/01/22.   TAKE ONLY THESE MEDICATIONS THE MORNING OF SURGERY WITH A SIP OF WATER:  DULoxetine (CYMBALTA)  gabapentin (NEURONTIN)  levothyroxine (SYNTHROID, LEVOTHROID)   solifenacin (VESICARE)  hydroxychloroquine (PLAQUENIL)  celecoxib (CELEBREX)    Use albuterol (PROVENTIL HFA;VENTOLIN HFA)  on the day of surgery and bring to the hospital.   No Alcohol for 24 hours before or after surgery.  No Smoking including e-cigarettes for 24 hours before surgery.  No chewable tobacco products for at least 6 hours before surgery.  No nicotine patches on the day of surgery.  Do not use any "recreational" drugs for at least a week (preferably 2 weeks) before your surgery.  Please be advised that the combination of cocaine and anesthesia may have negative outcomes, up to and including death. If you test positive for cocaine, your surgery will be cancelled.  On the morning of surgery brush your teeth with toothpaste and water, you may rinse your mouth with mouthwash if you wish. Do not swallow any toothpaste or mouthwash.  Use CHG Soap or wipes as directed on instruction sheet.  Do not wear jewelry, make-up, hairpins, clips or nail polish.  Do not wear lotions, powders, or perfumes.   Do not shave body hair from the neck down 48 hours before surgery.  Contact lenses, hearing aids and dentures may not be worn into surgery.  Do not bring valuables to the hospital. [facility] is not responsible for any missing/lost belongings or valuables.   Total Shoulder Arthroplasty:  use Benzoyl Peroxide 5% Gel as directed on instruction sheet.  Bring your C-PAP to the hospital in case you may have to spend the night.   Notify your doctor if there is any change in your medical condition (cold, fever, infection).  Wear comfortable clothing (specific to your surgery type)  to the hospital.  After surgery, you can help prevent lung complications by doing breathing exercises.  Take deep breaths and cough every 1-2 hours. Your doctor may order a device called an Incentive Spirometer to help you take deep breaths. When coughing or sneezing, hold a pillow firmly against your  incision with both hands. This is called "splinting." Doing this helps protect your incision. It also decreases belly discomfort.  If you are being admitted to the hospital overnight, leave your suitcase in the car. After surgery it may be brought to your room.  In case of increased patient census, it may be necessary for you, the patient, to continue your postoperative care in the Same Day Surgery department.  If you are being discharged the day of surgery, you will not be allowed to drive home. You will need a responsible individual to drive you home and stay with you for 24 hours after surgery.   If you are taking public transportation, you will need to have a responsible individual with you.  Please call the [facility]. at [phone] if you have any questions about these instructions.  Surgery Visitation Policy:  Patients undergoing a surgery or procedure may have two family members or support persons with them as long as the person is not COVID-19 positive or experiencing its symptoms.   Inpatient Visitation:    Visiting hours are 7 a.m. to 8 p.m. Up to four visitors are allowed at one time in a patient room. The visitors may rotate out with other people during the day. One designated support person (adult) may remain overnight.  Due to an increase in RSV and influenza rates and associated hospitalizations, children ages 54 and under will not be able to visit patients in [facility].  Masks continue to be strongly recommended.    Preparing for Surgery with CHLORHEXIDINE GLUCONATE (CHG) Soap  Chlorhexidine Gluconate (CHG) Soap  o An antiseptic cleaner that kills germs and bonds with the skin to continue killing germs even after washing  o Used for showering the night before surgery and morning of surgery  Before surgery, you can play an important role by reducing the number of germs on your skin.  CHG (Chlorhexidine gluconate) soap is an  antiseptic cleanser which kills germs and bonds with the skin to continue killing germs even after washing.  Please do not use if you have an allergy to CHG or antibacterial soaps. If your skin becomes reddened/irritated stop using the CHG.  1. Shower the NIGHT BEFORE SURGERY and the MORNING OF SURGERY with CHG soap.  2. If you choose to wash your hair, wash your hair first as usual with your normal shampoo.  3. After shampooing, rinse your hair and body thoroughly to remove the shampoo.  4. Use CHG as you would any other liquid soap. You can apply CHG directly to the skin and wash gently with a scrungie or a clean washcloth.  5. Apply the CHG soap to your body only from the neck down. Do not use on open wounds or open sores. Avoid contact with your eyes, ears, mouth, and genitals (private parts). Wash face and genitals (private parts) with your normal soap.  6. Wash thoroughly, paying special attention to the area where your surgery will be performed.  7. Thoroughly rinse your body with warm water.  8. Do not shower/wash with your normal soap after using and rinsing off the CHG soap.  9. Pat yourself dry with a clean towel.  10. Wear clean pajamas to bed the night before surgery.  12. Place clean sheets on your bed the night of your first shower and do not sleep with pets.  13. Shower again with the CHG soap on the day of surgery prior to arriving at the hospital.  14. Do not apply any deodorants/lotions/powders.  15. Please wear clean clothes to the hospital.  How to Use an Incentive Spirometer  An incentive spirometer is a tool that measures how well you are filling your lungs with each breath. Learning to take long, deep breaths using this tool can help you keep your lungs clear and active. This may help to reverse or lessen your chance of developing breathing (pulmonary) problems, especially infection. You may be asked to use a spirometer: After a surgery. If you have a lung  problem or a history of smoking. After a long period of time when you have been unable to move or be active. If the spirometer includes an indicator to show the highest number that you have reached, your health care provider or respiratory therapist will help you set a goal. Keep a log of your progress as told by your health care provider. What are the risks? Breathing too quickly may cause dizziness or cause you to pass out. Take your time so you do not get dizzy or light-headed. If you are in pain, you may need to take pain medicine before doing incentive spirometry. It is harder to take a deep breath if you are having pain. How to use your incentive spirometer  Sit up on the edge of your bed or on a chair. Hold the incentive spirometer so that it is in an upright position. Before you use the spirometer, breathe out normally. Place the mouthpiece in your mouth. Make sure your lips are closed tightly around it. Breathe in slowly and as deeply as you can through your mouth, causing the piston or the ball to rise toward the top of the chamber. Hold your breath for 3-5 seconds, or for as long as possible. If the spirometer includes a coach indicator, use this to guide you in breathing. Slow down your breathing if the indicator goes above the marked areas. Remove the mouthpiece from your mouth and breathe out normally. The piston or ball will return to the bottom of the chamber. Rest for a few seconds, then repeat the steps 10 or more times. Take your time and take a few normal breaths between deep breaths so that you do not get dizzy or light-headed. Do this every 1-2 hours when you are awake. If the spirometer includes a goal marker to show the highest number you have reached (best effort), use this as a goal to work toward during each repetition. After each set of 10 deep breaths, cough a few times. This will help to make sure that your lungs are clear. If you have an incision on your chest or  abdomen from surgery, place a pillow or a rolled-up towel firmly against the incision when you cough. This can help to reduce pain while taking deep breaths and coughing. General tips When you are able to get out of bed: Walk around often. Continue to take deep breaths and cough in order to clear your lungs. Keep using the incentive spirometer until your health care provider says it is okay to stop using it. If you have been in the hospital, you may be told to keep using the spirometer at home. Contact a health care  provider if: You are having difficulty using the spirometer. You have trouble using the spirometer as often as instructed. Your pain medicine is not giving enough relief for you to use the spirometer as told. You have a fever. Get help right away if: You develop shortness of breath. You develop a cough with bloody mucus from the lungs. You have fluid or blood coming from an incision site after you cough. Summary An incentive spirometer is a tool that can help you learn to take long, deep breaths to keep your lungs clear and active. You may be asked to use a spirometer after a surgery, if you have a lung problem or a history of smoking, or if you have been inactive for a long period of time. Use your incentive spirometer as instructed every 1-2 hours while you are awake. If you have an incision on your chest or abdomen, place a pillow or a rolled-up towel firmly against your incision when you cough. This will help to reduce pain. Get help right away if you have shortness of breath, you cough up bloody mucus, or blood comes from your incision when you cough. This information is not intended to replace advice given to you by your health care provider. Make sure you discuss any questions you have with your health care provider.   POLAR CARE INFORMATION  http://jones.com/  How to use [facility] Cold Therapy System?  YouTube    BargainHeads[employer]tn  OPERATING INSTRUCTIONS  Start the product With dry hands, connect the transformer to the electrical connection located on the top of the cooler. Next, plug the transformer into an appropriate electrical outlet. The unit will automatically start running at this point.  To stop the pump, disconnect electrical power.  Unplug to stop the product when not in use. Unplugging the Polar Care unit turns it off. Always unplug immediately after use. Never leave it plugged in while unattended. Remove pad.    FIRST ADD WATER TO FILL LINE, THEN ICE---Replace ice when existing ice is almost melted  1 Discuss Treatment with your [place] Practitioner and Use Only as Prescribed 2 Apply Insulation Barrier & Cold Therapy Pad 3 Check for Moisture 4 Inspect Skin Regularly  Tips and Trouble Shooting Usage Tips 1. Use cubed or chunked ice for optimal performance. 2. It is recommended to drain the Pad between uses. To drain the pad, hold the Pad upright with the hose pointed toward the ground. Depress the black plunger and allow water to drain out. 3. You may disconnect the Pad from the unit without removing the pad from the affected area by depressing the silver tabs on the hose coupling and gently pulling the hoses apart. The Pad and unit will seal itself and will not leak. Note: Some dripping during release is normal. 4. DO NOT RUN PUMP WITHOUT WATER! The pump in this unit is designed to run with water. Running the unit without water will cause permanent damage to the pump. 5. Unplug unit before removing lid.  TROUBLESHOOTING GUIDE Pump not running, Water not flowing to the pad, Pad is not getting cold 1. Make sure the transformer is plugged into the wall outlet. 2. Confirm that the ice and water are filled to the indicated levels. 3. Make sure there are no kinks in the pad. 4. Gently pull on the blue tube to make sure the tube/pad junction is  straight. 5. Remove the pad from the treatment site and ll it while the pad is  lying at; then reapply. 6. Confirm that the pad couplings are securely attached to the unit. Listen for the double clicks (Figure 1) to confirm the pad couplings are securely attached.  Leaks    Note: Some condensation on the lines, controller, and pads is unavoidable, especially in warmer climates. 1. If using a [facility] Polar Care Cold Therapy unit with a detachable Cold Therapy Pad, and a leak exists (other than condensation on the lines) disconnect the pad couplings. Make sure the silver tabs on the couplings are depressed before reconnecting the pad to the pump hose; then confirm both sides of the coupling are properly clicked in. 2. If the coupling continues to leak or a leak is detected in the pad itself, stop using it and call [place] at [phone].  Cleaning After use, empty and dry the unit with a soft cloth. Warm water and mild detergent may be used occasionally to clean the pump and tubes.  WARNING: The [place] can be cold enough to cause serious injury, including full skin necrosis. Follow these Operating Instructions, and carefully read the Product Insert (see pouch on side of unit) and the Cold Therapy Pad Fitting Instructions (provided with each Cold Therapy Pad) prior to use.       Preparing for Total Shoulder Arthroplasty  Before surgery, you can play an important role by reducing the number of germs on your skin by using the following products:  Benzoyl Peroxide Gel  o Reduces the number of germs present on the skin  o Applied twice a day to shoulder area starting two days before surgery  Chlorhexidine Gluconate (CHG) Soap  o An antiseptic cleaner that kills germs and bonds with the skin to continue killing germs even after washing  o Used for showering the night before surgery and morning of surgery  BENZOYL PEROXIDE 5% GEL  Please do not use if you have an allergy  to benzoyl peroxide. If your skin becomes reddened/irritated stop using the benzoyl peroxide.  Starting two days before surgery, apply as follows:  1. Apply benzoyl peroxide in the morning and at night. Apply after taking a shower. If you are not taking a shower, clean entire shoulder front, back, and side along with the armpit with a clean wet washcloth.  2. Place a quarter-sized dollop on your shoulder and rub in thoroughly, making sure to cover the front, back, and side of your shoulder, along with the armpit.  2 days before ____ AM ____ PM 1 day before ____ AM ____ PM  3. Do this twice a day for two days. (Last application is the night before surgery, AFTER using the CHG soap).  4. Do NOT apply benzoyl peroxide gel on the day of surgery.

## 2022-07-02 MED ORDER — SODIUM CHLORIDE 0.9 % IV SOLN: INTRAVENOUS | Status: DC

## 2022-07-02 MED ORDER — CHLORHEXIDINE GLUCONATE 0.12 % MT SOLN: 15.0000 mL | Freq: Once | OROMUCOSAL | Status: AC

## 2022-07-02 MED ORDER — CEFAZOLIN SODIUM-DEXTROSE 2-4 GM/100ML-% IV SOLN
2.0000 g | INTRAVENOUS | Status: AC
  Administered 2022-07-03: 2 g via INTRAVENOUS

## 2022-07-02 MED ORDER — ORAL CARE MOUTH RINSE: 15.0000 mL | Freq: Once | OROMUCOSAL | Status: AC

## 2022-07-02 MED ORDER — VANCOMYCIN HCL IN DEXTROSE 1-5 GM/200ML-% IV SOLN: 1000.0000 mg | Freq: Once | INTRAVENOUS | Status: AC

## 2022-07-03 ENCOUNTER — Other Ambulatory Visit: Payer: Self-pay

## 2022-07-03 ENCOUNTER — Ambulatory Visit
Admission: RE | Admit: 2022-07-03 | Discharge: 2022-07-03 | Disposition: A | Discharge status: Home / Self Care | Payer: Medicare HMO | Attending: Surgery | Admitting: Surgery

## 2022-07-03 ENCOUNTER — Ambulatory Visit: Payer: Medicare HMO | Admitting: Urgent Care

## 2022-07-03 ENCOUNTER — Ambulatory Visit: Payer: Medicare HMO

## 2022-07-03 ENCOUNTER — Encounter
Admission: RE | Disposition: A | Discharge status: Home / Self Care | Payer: Self-pay | Source: Home / Self Care | Attending: Surgery

## 2022-07-03 ENCOUNTER — Encounter: Payer: Self-pay | Admitting: Surgery

## 2022-07-03 ENCOUNTER — Ambulatory Visit: Payer: Medicare HMO | Admitting: Anesthesiology

## 2022-07-03 DIAGNOSIS — M7521 Bicipital tendinitis, right shoulder: Secondary | ICD-10-CM | POA: Insufficient documentation

## 2022-07-03 DIAGNOSIS — E039 Hypothyroidism, unspecified: Secondary | ICD-10-CM | POA: Diagnosis not present

## 2022-07-03 DIAGNOSIS — Z7984 Long term (current) use of oral hypoglycemic drugs: Secondary | ICD-10-CM | POA: Insufficient documentation

## 2022-07-03 DIAGNOSIS — M19011 Primary osteoarthritis, right shoulder: Secondary | ICD-10-CM | POA: Insufficient documentation

## 2022-07-03 DIAGNOSIS — Z01812 Encounter for preprocedural laboratory examination: Secondary | ICD-10-CM

## 2022-07-03 DIAGNOSIS — I10 Essential (primary) hypertension: Secondary | ICD-10-CM | POA: Diagnosis not present

## 2022-07-03 DIAGNOSIS — K760 Fatty (change of) liver, not elsewhere classified: Secondary | ICD-10-CM | POA: Diagnosis not present

## 2022-07-03 DIAGNOSIS — Z6835 Body mass index (BMI) 35.0-35.9, adult: Secondary | ICD-10-CM | POA: Insufficient documentation

## 2022-07-03 DIAGNOSIS — G4733 Obstructive sleep apnea (adult) (pediatric): Secondary | ICD-10-CM | POA: Diagnosis not present

## 2022-07-03 DIAGNOSIS — M75111 Incomplete rotator cuff tear or rupture of right shoulder, not specified as traumatic: Secondary | ICD-10-CM | POA: Diagnosis not present

## 2022-07-03 DIAGNOSIS — Z8249 Family history of ischemic heart disease and other diseases of the circulatory system: Secondary | ICD-10-CM | POA: Diagnosis not present

## 2022-07-03 DIAGNOSIS — Z90710 Acquired absence of both cervix and uterus: Secondary | ICD-10-CM | POA: Insufficient documentation

## 2022-07-03 DIAGNOSIS — E1140 Type 2 diabetes mellitus with diabetic neuropathy, unspecified: Secondary | ICD-10-CM | POA: Insufficient documentation

## 2022-07-03 HISTORY — PX: REVERSE SHOULDER ARTHROPLASTY: SHX5054

## 2022-07-03 LAB — GLUCOSE, CAPILLARY
Glucose-Capillary: 113 mg/dL — ABNORMAL HIGH (ref 70–99)
Glucose-Capillary: 200 mg/dL — ABNORMAL HIGH (ref 70–99)

## 2022-07-03 SURGERY — ARTHROPLASTY, SHOULDER, TOTAL, REVERSE
Anesthesia: General | Site: Shoulder | Laterality: Right

## 2022-07-03 MED ORDER — EPHEDRINE SULFATE (PRESSORS) 50 MG/ML IJ SOLN
INTRAMUSCULAR | Status: DC | PRN
  Administered 2022-07-03 (×5): 5 mg via INTRAVENOUS

## 2022-07-03 MED ORDER — TRANEXAMIC ACID 1000 MG/10ML IV SOLN
INTRAVENOUS | Status: AC
  Filled 2022-07-03: qty 10

## 2022-07-03 MED ORDER — FENTANYL CITRATE (PF) 100 MCG/2ML IJ SOLN
INTRAMUSCULAR | Status: DC | PRN
  Administered 2022-07-03: 25 ug via INTRAVENOUS

## 2022-07-03 MED ORDER — ACETAMINOPHEN 500 MG PO TABS: 1000.0000 mg | ORAL_TABLET | Freq: Once | ORAL | Status: AC

## 2022-07-03 MED ORDER — OXYCODONE HCL 5 MG PO TABS
ORAL_TABLET | ORAL | Status: AC
  Administered 2022-07-03: 5 mg via ORAL
  Filled 2022-07-03: qty 1

## 2022-07-03 MED ORDER — GLYCOPYRROLATE 0.2 MG/ML IJ SOLN
INTRAMUSCULAR | Status: DC | PRN
  Administered 2022-07-03: .2 mg via INTRAVENOUS

## 2022-07-03 MED ORDER — VASOPRESSIN 20 UNIT/ML IV SOLN
INTRAVENOUS | Status: DC | PRN
  Administered 2022-07-03: 1 [IU] via INTRAVENOUS

## 2022-07-03 MED ORDER — MIDAZOLAM HCL 2 MG/2ML IJ SOLN
INTRAMUSCULAR | Status: AC
  Administered 2022-07-03: 1 mg via INTRAVENOUS
  Filled 2022-07-03: qty 2

## 2022-07-03 MED ORDER — BUPIVACAINE LIPOSOME 1.3 % IJ SUSP
INTRAMUSCULAR | Status: DC | PRN
  Administered 2022-07-03: 10 mL via PERINEURAL

## 2022-07-03 MED ORDER — ONDANSETRON HCL 4 MG/2ML IJ SOLN: 4.0000 mg | Freq: Four times a day (QID) | INTRAMUSCULAR | Status: DC | PRN

## 2022-07-03 MED ORDER — 0.9 % SODIUM CHLORIDE (POUR BTL) OPTIME
TOPICAL | Status: DC | PRN
  Administered 2022-07-03: 500 mL

## 2022-07-03 MED ORDER — FENTANYL CITRATE PF 50 MCG/ML IJ SOSY
PREFILLED_SYRINGE | INTRAMUSCULAR | Status: AC
  Administered 2022-07-03: 50 ug via INTRAVENOUS
  Filled 2022-07-03: qty 1

## 2022-07-03 MED ORDER — METOCLOPRAMIDE HCL 10 MG PO TABS: 5.0000 mg | ORAL_TABLET | Freq: Three times a day (TID) | ORAL | Status: DC | PRN

## 2022-07-03 MED ORDER — PROMETHAZINE HCL 25 MG/ML IJ SOLN: 6.2500 mg | INTRAMUSCULAR | Status: DC | PRN

## 2022-07-03 MED ORDER — BUPIVACAINE HCL (PF) 0.5 % IJ SOLN
INTRAMUSCULAR | Status: AC
  Filled 2022-07-03: qty 30

## 2022-07-03 MED ORDER — OXYCODONE HCL 5 MG/5ML PO SOLN: 5.0000 mg | Freq: Once | ORAL | Status: AC | PRN

## 2022-07-03 MED ORDER — BUPIVACAINE HCL (PF) 0.5 % IJ SOLN
INTRAMUSCULAR | Status: AC
  Filled 2022-07-03: qty 10

## 2022-07-03 MED ORDER — SODIUM CHLORIDE 0.9 % IV SOLN: INTRAVENOUS | Status: DC

## 2022-07-03 MED ORDER — METOCLOPRAMIDE HCL 5 MG/ML IJ SOLN: 5.0000 mg | Freq: Three times a day (TID) | INTRAMUSCULAR | Status: DC | PRN

## 2022-07-03 MED ORDER — ONDANSETRON HCL 4 MG PO TABS: 4.0000 mg | ORAL_TABLET | Freq: Four times a day (QID) | ORAL | Status: DC | PRN

## 2022-07-03 MED ORDER — DROPERIDOL 2.5 MG/ML IJ SOLN: 0.6250 mg | Freq: Once | INTRAMUSCULAR | Status: DC | PRN

## 2022-07-03 MED ORDER — EPINEPHRINE PF 1 MG/ML IJ SOLN
INTRAMUSCULAR | Status: AC
  Filled 2022-07-03: qty 1

## 2022-07-03 MED ORDER — OXYCODONE HCL 5 MG PO TABS: 5.0000 mg | ORAL_TABLET | ORAL | 0 refills | Status: DC | PRN

## 2022-07-03 MED ORDER — FENTANYL CITRATE (PF) 100 MCG/2ML IJ SOLN
INTRAMUSCULAR | Status: AC
  Filled 2022-07-03: qty 2

## 2022-07-03 MED ORDER — CEFAZOLIN SODIUM-DEXTROSE 2-4 GM/100ML-% IV SOLN: 2.0000 g | Freq: Four times a day (QID) | INTRAVENOUS | Status: DC

## 2022-07-03 MED ORDER — ACETAMINOPHEN 500 MG PO TABS
ORAL_TABLET | ORAL | Status: AC
  Administered 2022-07-03: 1000 mg via ORAL
  Filled 2022-07-03: qty 2

## 2022-07-03 MED ORDER — CEFAZOLIN SODIUM-DEXTROSE 2-4 GM/100ML-% IV SOLN
INTRAVENOUS | Status: AC
  Administered 2022-07-03: 2 g via INTRAVENOUS
  Filled 2022-07-03: qty 100

## 2022-07-03 MED ORDER — CHLORHEXIDINE GLUCONATE 0.12 % MT SOLN
OROMUCOSAL | Status: AC
  Administered 2022-07-03: 15 mL via OROMUCOSAL
  Filled 2022-07-03: qty 15

## 2022-07-03 MED ORDER — DEXAMETHASONE SODIUM PHOSPHATE 10 MG/ML IJ SOLN
INTRAMUSCULAR | Status: DC | PRN
  Administered 2022-07-03: 10 mg via INTRAVENOUS

## 2022-07-03 MED ORDER — FENTANYL CITRATE (PF) 100 MCG/2ML IJ SOLN: 25.0000 ug | INTRAMUSCULAR | Status: DC | PRN

## 2022-07-03 MED ORDER — PHENYLEPHRINE HCL-NACL 20-0.9 MG/250ML-% IV SOLN
INTRAVENOUS | Status: DC | PRN
  Administered 2022-07-03: 50 ug/min via INTRAVENOUS

## 2022-07-03 MED ORDER — OXYCODONE HCL 5 MG PO TABS: 5.0000 mg | ORAL_TABLET | ORAL | Status: DC | PRN

## 2022-07-03 MED ORDER — MIDAZOLAM HCL 2 MG/2ML IJ SOLN: 1.0000 mg | Freq: Once | INTRAMUSCULAR | Status: AC

## 2022-07-03 MED ORDER — KETOROLAC TROMETHAMINE 15 MG/ML IJ SOLN
INTRAMUSCULAR | Status: AC
  Administered 2022-07-03: 15 mg via INTRAVENOUS
  Filled 2022-07-03: qty 1

## 2022-07-03 MED ORDER — ROCURONIUM BROMIDE 100 MG/10ML IV SOLN
INTRAVENOUS | Status: DC | PRN
  Administered 2022-07-03: 50 mg via INTRAVENOUS

## 2022-07-03 MED ORDER — OXYCODONE HCL 5 MG PO TABS: 5.0000 mg | ORAL_TABLET | Freq: Once | ORAL | Status: AC | PRN

## 2022-07-03 MED ORDER — FENTANYL CITRATE PF 50 MCG/ML IJ SOSY: 50.0000 ug | PREFILLED_SYRINGE | Freq: Once | INTRAMUSCULAR | Status: AC

## 2022-07-03 MED ORDER — PROPOFOL 1000 MG/100ML IV EMUL
INTRAVENOUS | Status: AC
  Filled 2022-07-03: qty 100

## 2022-07-03 MED ORDER — ONDANSETRON HCL 4 MG/2ML IJ SOLN
INTRAMUSCULAR | Status: DC | PRN
  Administered 2022-07-03: 4 mg via INTRAVENOUS

## 2022-07-03 MED ORDER — ACETAMINOPHEN 325 MG PO TABS: 325.0000 mg | ORAL_TABLET | Freq: Four times a day (QID) | ORAL | Status: DC | PRN

## 2022-07-03 MED ORDER — BUPIVACAINE HCL (PF) 0.5 % IJ SOLN
INTRAMUSCULAR | Status: DC | PRN
  Administered 2022-07-03: 10 mL via PERINEURAL

## 2022-07-03 MED ORDER — EPHEDRINE 5 MG/ML INJ
INTRAVENOUS | Status: AC
  Filled 2022-07-03: qty 5

## 2022-07-03 MED ORDER — BUPIVACAINE LIPOSOME 1.3 % IJ SUSP
INTRAMUSCULAR | Status: AC
  Filled 2022-07-03: qty 10

## 2022-07-03 MED ORDER — VANCOMYCIN HCL IN DEXTROSE 1-5 GM/200ML-% IV SOLN
INTRAVENOUS | Status: AC
  Administered 2022-07-03: 1000 mg via INTRAVENOUS
  Filled 2022-07-03: qty 200

## 2022-07-03 MED ORDER — TRANEXAMIC ACID 1000 MG/10ML IV SOLN
INTRAVENOUS | Status: DC | PRN
  Administered 2022-07-03: 1000 mg via TOPICAL

## 2022-07-03 MED ORDER — SEVOFLURANE IN SOLN
RESPIRATORY_TRACT | Status: AC
  Filled 2022-07-03: qty 250

## 2022-07-03 MED ORDER — CEFAZOLIN SODIUM-DEXTROSE 2-4 GM/100ML-% IV SOLN
INTRAVENOUS | Status: AC
  Filled 2022-07-03: qty 100

## 2022-07-03 MED ORDER — BUPIVACAINE-EPINEPHRINE (PF) 0.5% -1:200000 IJ SOLN
INTRAMUSCULAR | Status: DC | PRN
  Administered 2022-07-03: 40 mL via INTRAMUSCULAR

## 2022-07-03 MED ORDER — ACETAMINOPHEN 10 MG/ML IV SOLN: 1000.0000 mg | Freq: Once | INTRAVENOUS | Status: DC | PRN

## 2022-07-03 MED ORDER — GABAPENTIN 800 MG PO TABS: 800.0000 mg | ORAL_TABLET | Freq: Two times a day (BID) | ORAL | Status: AC

## 2022-07-03 MED ORDER — MIDAZOLAM HCL 2 MG/2ML IJ SOLN
1.0000 mg | Freq: Once | INTRAMUSCULAR | Status: AC
  Administered 2022-07-03: 1 mg via INTRAVENOUS

## 2022-07-03 MED ORDER — LIDOCAINE HCL (CARDIAC) PF 100 MG/5ML IV SOSY
PREFILLED_SYRINGE | INTRAVENOUS | Status: DC | PRN
  Administered 2022-07-03: 50 mg via INTRAVENOUS

## 2022-07-03 MED ORDER — SODIUM CHLORIDE 0.9 % IR SOLN
Status: DC | PRN
  Administered 2022-07-03: 3000 mL

## 2022-07-03 MED ORDER — KETOROLAC TROMETHAMINE 15 MG/ML IJ SOLN: 15.0000 mg | Freq: Once | INTRAMUSCULAR | Status: AC

## 2022-07-03 MED ORDER — PROPOFOL 10 MG/ML IV BOLUS
INTRAVENOUS | Status: DC | PRN
  Administered 2022-07-03: 50 mg via INTRAVENOUS

## 2022-07-03 MED ORDER — VANCOMYCIN HCL 1000 MG IV SOLR
INTRAVENOUS | Status: DC | PRN
  Administered 2022-07-03: 1000 mg via INTRAVENOUS

## 2022-07-03 SURGICAL SUPPLY — 70 items
APL PRP STRL LF DISP 70% ISPRP (MISCELLANEOUS) ×2
BASEPLATE BOSS DRILL (MISCELLANEOUS) IMPLANT
BIT DRILL 2.5 (BIT) ×1
BIT DRILL 2.5X4.5XSCR (BIT) IMPLANT
BIT DRILL BASEPLATE CENTRAL S (BIT) IMPLANT
BIT DRL 2.5X4.5XSCR (BIT) ×1
BLADE SAW SAG 25X90X1.19 (BLADE) ×1 IMPLANT
BSPLAT GLND THK2 22.8X27.3 (Plate) ×1 IMPLANT
CHLORAPREP W/TINT 26 (MISCELLANEOUS) ×1 IMPLANT
COOLER POLAR GLACIER W/PUMP (MISCELLANEOUS) ×1 IMPLANT
COVER BACK TABLE REUSABLE LG (DRAPES) ×1 IMPLANT
DRAPE 3/4 80X56 (DRAPES) ×1 IMPLANT
DRAPE INCISE IOBAN 66X45 STRL (DRAPES) ×1 IMPLANT
DRSG OPSITE POSTOP 4X8 (GAUZE/BANDAGES/DRESSINGS) ×1 IMPLANT
ELECT BLADE 6.5 EXT (BLADE) IMPLANT
ELECT CAUTERY BLADE 6.4 (BLADE) ×1 IMPLANT
ELECT REM PT RETURN 9FT ADLT (ELECTROSURGICAL) ×1
ELECTRODE REM PT RTRN 9FT ADLT (ELECTROSURGICAL) ×1 IMPLANT
GAUZE XEROFORM 1X8 LF (GAUZE/BANDAGES/DRESSINGS) ×1 IMPLANT
GLENOSPHERE RSS 2 CONCENTRIC (Shoulder) IMPLANT
GLOVE BIO SURGEON STRL SZ7.5 (GLOVE) ×4 IMPLANT
GLOVE BIO SURGEON STRL SZ8 (GLOVE) ×4 IMPLANT
GLOVE BIOGEL PI IND STRL 8 (GLOVE) ×2 IMPLANT
GLOVE SURG UNDER LTX SZ8 (GLOVE) ×1 IMPLANT
GOWN STRL REUS W/ TWL LRG LVL3 (GOWN DISPOSABLE) ×1 IMPLANT
GOWN STRL REUS W/ TWL XL LVL3 (GOWN DISPOSABLE) ×1 IMPLANT
GOWN STRL REUS W/TWL LRG LVL3 (GOWN DISPOSABLE) ×1
GOWN STRL REUS W/TWL XL LVL3 (GOWN DISPOSABLE) ×1
GUIDE PIN 2.0 X 150 (WIRE) IMPLANT
HOOD PEEL AWAY T7 (MISCELLANEOUS) ×3 IMPLANT
IV NS IRRIG 3000ML ARTHROMATIC (IV SOLUTION) ×1 IMPLANT
KIT STABILIZATION SHOULDER (MISCELLANEOUS) ×1 IMPLANT
KIT TURNOVER KIT A (KITS) ×1 IMPLANT
LINER STD +3S RSS HXL (Liner) IMPLANT
MANIFOLD NEPTUNE II (INSTRUMENTS) ×1 IMPLANT
MASK FACE SPIDER DISP (MASK) ×1 IMPLANT
MAT ABSORB  FLUID 56X50 GRAY (MISCELLANEOUS) ×1
MAT ABSORB FLUID 56X50 GRAY (MISCELLANEOUS) ×1 IMPLANT
NDL MAYO CATGUT SZ1 (NEEDLE) IMPLANT
NDL SAFETY ECLIP 18X1.5 (MISCELLANEOUS) ×1 IMPLANT
NDL SPNL 20GX3.5 QUINCKE YW (NEEDLE) ×1 IMPLANT
NEEDLE MAYO CATGUT SZ1 (NEEDLE) IMPLANT
NEEDLE SPNL 20GX3.5 QUINCKE YW (NEEDLE) ×1 IMPLANT
NS IRRIG 500ML POUR BTL (IV SOLUTION) ×1 IMPLANT
PACK ARTHROSCOPY SHOULDER (MISCELLANEOUS) ×1 IMPLANT
PAD ARMBOARD 7.5X6 YLW CONV (MISCELLANEOUS) ×1 IMPLANT
PAD WRAPON POLAR SHDR UNIV (MISCELLANEOUS) ×1 IMPLANT
PLATE BASE REVERSE RSS S (Plate) IMPLANT
PULSAVAC PLUS IRRIG FAN TIP (DISPOSABLE) ×1
SCREW 4.5X15 RSS W CAP (Screw) IMPLANT
SCREW 4.5X50 RSS W CAP (Screw) ×1 IMPLANT
SCREW BN 40X4.5XSTAR CAP (Screw) IMPLANT
SCREW BODY REVERSE SMALL TITAN (Screw) IMPLANT
SLING ULTRA II M (MISCELLANEOUS) IMPLANT
SPONGE T-LAP 18X18 ~~LOC~~+RFID (SPONGE) ×2 IMPLANT
STAPLER SKIN PROX 35W (STAPLE) ×1 IMPLANT
STEM HUM 11 (Stem) IMPLANT
SUT ETHIBOND 0 MO6 C/R (SUTURE) ×1 IMPLANT
SUT FIBERWIRE #2 38 BLUE 1/2 (SUTURE) ×4
SUT VIC AB 0 CT1 36 (SUTURE) ×1 IMPLANT
SUT VIC AB 2-0 CT1 27 (SUTURE) ×2
SUT VIC AB 2-0 CT1 TAPERPNT 27 (SUTURE) ×2 IMPLANT
SUTURE FIBERWR #2 38 BLUE 1/2 (SUTURE) ×4 IMPLANT
SYR 10ML LL (SYRINGE) ×1 IMPLANT
SYR 30ML LL (SYRINGE) ×1 IMPLANT
SYR TOOMEY 50ML (SYRINGE) ×1 IMPLANT
TIP FAN IRRIG PULSAVAC PLUS (DISPOSABLE) ×1 IMPLANT
TRAP FLUID SMOKE EVACUATOR (MISCELLANEOUS) ×1 IMPLANT
WATER STERILE IRR 500ML POUR (IV SOLUTION) ×1 IMPLANT
WRAPON POLAR PAD SHDR UNIV (MISCELLANEOUS) ×1

## 2022-07-03 NOTE — Transfer of Care (Signed)
Immediate Anesthesia Transfer of Care Note  Patient: Jessica Cole  Procedure(s) Performed: REVERSE SHOULDER ARTHROPLASTY W/ BICEPS TENODESIS. (Right: Shoulder)  Patient Location: PACU  Anesthesia Type:General  Level of Consciousness: awake and alert   Airway & Oxygen Therapy: Patient Spontanous Breathing and Patient connected to face mask oxygen  Post-op Assessment: Report given to RN and Post -op Vital signs reviewed and stable  Post vital signs: Reviewed and stable  Last Vitals:  Vitals Value Taken Time  BP 107/50 07/03/22 1300  Temp 36.6 C 07/03/22 1300  Pulse 94 07/03/22 1304  Resp 22 07/03/22 1304  SpO2 98 % 07/03/22 1304  Vitals shown include unvalidated device data.  Last Pain:  Vitals:   07/03/22 0950  TempSrc:   PainSc: Asleep         Complications: No notable events documented.

## 2022-07-03 NOTE — Anesthesia Preprocedure Evaluation (Addendum)
Anesthesia Evaluation  Patient identified by MRN, date of birth, ID band Patient awake    Reviewed: Allergy & Precautions, H&P , NPO status , Patient's Chart, lab work & pertinent test results  Airway Mallampati: II  TM Distance: >3 FB Neck ROM: full    Dental no notable dental hx.    Pulmonary sleep apnea  Pt denies asthma. It is charted as seasonal with allergies   Pulmonary exam normal        Cardiovascular hypertension, Normal cardiovascular exam  Myocardial perfusion scan 2018: LVEF= 67%  FINDINGS:  Regional wall motion:  reveals normal myocardial thickening and wall  motion.  The overall quality of the study is good.   Artifacts noted: no  Left ventricular cavity: normal.  Perfusion Analysis:  SPECT images demonstrate homogeneous tracer  distribution throughout the myocardium.     Pt reports a remote history of chest pain with negative cardiac work-up for ischemia.    Neuro/Psych  PSYCHIATRIC DISORDERS Anxiety Depression    negative neurological ROS     GI/Hepatic negative GI ROS, Neg liver ROS,,,  Endo/Other  diabetes, Well Controlled, Type 2, Oral Hypoglycemic AgentsHypothyroidism    Renal/GU      Musculoskeletal  (+) Arthritis , Rheumatoid disorders,    Abdominal  (+) + obese  Peds  Hematology negative hematology ROS (+)   Anesthesia Other Findings Past Medical History: No date: Anxiety No date: Asthma     Comment:  seasonal with allergies No date: Colon polyps No date: Degenerative arthritis No date: Depression No date: Environmental allergies No date: Family history of adverse reaction to anesthesia     Comment:  a.) PONV in 1st degree relative (sister) No date: Fatty liver No date: Glaucoma No date: History of bronchitis No date: History of kidney stones No date: History of pneumonia No date: Hypertension No date: Hypothyroidism No date: Migraines 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      Comment:  a.) preoperative PCR (+) on 02/14/2021 prior to LUMBAR               MICRODISCECTOMY; b.) preoperative PCR (+) 06/24/2022               prior to [place]               TENODESIS No date: Obesity No date: OSA on CPAP No date: Plantar fasciitis     Comment:  right No date: Pneumonia No date: Renal calculi No date: Rheumatoid arthritis ([place]) 08/23/2014: RLS (restless legs syndrome) No date: T2DM (type 2 diabetes mellitus) ([place])  Past Surgical History: No date: ABDOMINAL HYSTERECTOMY     Comment:  partial No date: APPENDECTOMY No date: Arthroscopic surgery, knee; Left No date: BREAST BIOPSY; Right     Comment:  several 03/11/2017: BREAST BIOPSY; Right     Comment:  u/s bx neg No date: BUNIONECTOMY     Comment:  LEFT  No date: COLONOSCOPY W/ POLYPECTOMY 08/14/2017: COLONOSCOPY WITH PROPOFOL; N/A     Comment:  Procedure: COLONOSCOPY WITH PROPOFOL;  Surgeon:               Lollie Sails, MD;  Location[facility] ENDOSCOPY;                Service: Endoscopy;  Laterality: N/A; 05/14/2017: EXTRACORPOREAL SHOCK WAVE LITHOTRIPSY; Left     Comment:  Procedure: EXTRACORPOREAL SHOCK WAVE LITHOTRIPSY (ESWL);              Surgeon: Bernardo Heater,  Ronda Fairly, MD;  Location: [facility] ORS;                Service: Urology;  Laterality: Left; 06/14/2020: EXTRACORPOREAL SHOCK WAVE LITHOTRIPSY; Left     Comment:  Procedure: EXTRACORPOREAL SHOCK WAVE LITHOTRIPSY (ESWL);              Surgeon: Billey Co, MD;  Location: [facility] ORS;                Service: Urology;  Laterality: Left; No date: FOOT SURGERY     Comment:  RIGHT   No date: KNEE ARTHROSCOPY W/ MENISCAL REPAIR; Left No date: LASIK 03/01/2021: LUMBAR LAMINECTOMY/DECOMPRESSION MICRODISCECTOMY; Right     Comment:  Procedure: Microdiscectomy - Lumbar five-Sacral one -               right;  Surgeon: Eustace Moore, MD;  Location: [facility];                Service: Neurosurgery;  Laterality: Right; 04/25/2016: MAXIMUM  ACCESS (MAS)POSTERIOR LUMBAR INTERBODY FUSION  (PLIF) 1 LEVEL; N/A     Comment:  Procedure: LUMBAR FOUR-FIVE  MAXIMUM ACCESS (MAS)               POSTERIOR LUMBAR INTERBODY FUSION (PLIF) with extension               of instrumentation LUMBAR TWO-FIVE;  Surgeon: Eustace Moore, MD;  Location: [facility];  Service: Neurosurgery;                Laterality: N/A; 12/13/2015: MAXIMUM ACCESS (MAS)POSTERIOR LUMBAR INTERBODY FUSION  (PLIF) 2 LEVEL; N/A     Comment:  Procedure: Lumbar two-three - Lumbar three-four MAXIMUM               ACCESS (MAS) POSTERIOR LUMBAR INTERBODY FUSION (PLIF)  ;               Surgeon: Eustace Moore, MD;  Location: [facility] NEURO ORS;                Service: Neurosurgery;  Laterality: N/A; 03/01/2020: REVERSE SHOULDER ARTHROPLASTY; Left     Comment:  Procedure: REVERSE SHOULDER ARTHROPLASTY;  Surgeon:               Corky Mull, MD;  Location: [facility] ORS;  Service:               Orthopedics;  Laterality: Left; No date: SHOULDER SURGERY     Comment:  RIGHT  No date: TONSILLECTOMY     Reproductive/Obstetrics negative OB ROS                             Anesthesia Physical Anesthesia Plan  ASA: 2  Anesthesia Plan: General ETT   Post-op Pain Management: Regional block* and Tylenol PO (pre-op)*   Induction: Intravenous  PONV Risk Score and Plan: 2 and Ondansetron, Dexamethasone and Midazolam  Airway Management Planned: Oral ETT  Additional Equipment:   Intra-op Plan:   Post-operative Plan: Extubation in OR  Informed Consent: I have reviewed the patients History and Physical, chart, labs and discussed the procedure including the risks, benefits and alternatives for the proposed anesthesia with the patient or authorized representative who has indicated his/her understanding and acceptance.     Dental Advisory Given  Plan Discussed with: CRNA and Surgeon  Anesthesia  Plan Comments:         Anesthesia Quick Evaluation

## 2022-07-03 NOTE — Discharge Instructions (Addendum)
Orthopedic discharge instructions: May shower with intact OpSite dressing once nerve block has worn off on post-op day #4 (Monday).  Apply ice frequently to shoulder or use Polar Care device. Take Naproxen sodium 2 tabs BID OR Celebrex 200-400 mg BID with meals for 3-5 days, then as necessary. Take oxycodone as prescribed when needed.  May supplement with ES Tylenol if necessary. Keep shoulder immobilizer on at all times except may remove for bathing purposes. Follow-up in 10-14 days or as scheduled.   AMBULATORY SURGERY  DISCHARGE INSTRUCTIONS   The drugs that you were given will stay in your system until tomorrow so for the next 24 hours you should not:  Drive an automobile Make any legal decisions Drink any alcoholic beverage   You may resume regular meals tomorrow.  Today it is better to start with liquids and gradually work up to solid foods.  You may eat anything you prefer, but it is better to start with liquids, then soup and crackers, and gradually work up to solid foods.   Please notify your doctor immediately if you have any unusual bleeding, trouble breathing, redness and pain at the surgery site, drainage, fever, or pain not relieved by medication.         Interscalene Nerve Block with Exparel  DO NOT REMOVE TEAL BRACELET FOR 96 hours (4 days) 07/07/2022   For your surgery you have received an Interscalene Nerve Block with Exparel. Nerve Blocks affect many types of nerves, including nerves that control movement, pain and normal sensation.  You may experience feelings such as numbness, tingling, heaviness, weakness or the inability to move your arm or the feeling or sensation that your arm has "fallen asleep". A nerve block with Exparel can last up to 5 days.  Usually the weakness wears off first.  The tingling and heaviness usually wear off next.  Finally you may start to notice pain.  Keep in mind that this may occur in any order.  Once a nerve block starts to wear  off it is usually completely gone within 60 minutes. ISNB may cause mild shortness of breath, a hoarse voice, blurry vision, unequal pupils, or drooping of the face on the same side as the nerve block.  These symptoms will usually resolve with the numbness.  Very rarely the procedure itself can cause mild seizures. If needed, your surgeon will give you a prescription for pain medication.  It will take about 60 minutes for the oral pain medication to become fully effective.  So, it is recommended that you start taking this medication before the nerve block first begins to wear off, or when you first begin to feel discomfort. Take your pain medication only as prescribed.  Pain medication can cause sedation and decrease your breathing if you take more than you need for the level of pain that you have. Nausea is a common side effect of many pain medications.  You may want to eat something before taking your pain medicine to prevent nausea. After an Interscalene nerve block, you cannot feel pain, pressure or extremes in temperature in the effected arm.  Because your arm is numb it is at an increased risk for injury.  To decrease the possibility of injury, please practice the following:  While you are awake change the position of your arm frequently to prevent too much pressure on any one area for prolonged periods of time.  If you have a cast or tight dressing, check the color or your fingers every couple  of hours.  Call your surgeon with the appearance of any discoloration (white or blue). If you are given a sling to wear before you go home, please wear it  at all times until the block has completely worn off.  Do not get up at night without your sling. Please contact [facility] or your surgeon if you do not begin to regain sensation after 7 days from the surgery.  Anesthesia may be contacted by calling the Same Day Surgery Department, Mon. through Fri., 6 am to 4 pm at [phone].   If you experience  any other problems or concerns, please contact your surgeon's office. If you experience severe or prolonged shortness of breath go to the nearest emergency department.   POLAR CARE INFORMATION  [email or web address]  How to use [facility] Cold Therapy System?  YouTube   [email or web address]  OPERATING INSTRUCTIONS  Start the product With dry hands, connect the transformer to the electrical connection located on the top of the cooler. Next, plug the transformer into an appropriate electrical outlet. The unit will automatically start running at this point.  To stop the pump, disconnect electrical power.  Unplug to stop the product when not in use. Unplugging the Polar Care unit turns it off. Always unplug immediately after use. Never leave it plugged in while unattended. Remove pad.    FIRST ADD WATER TO FILL LINE, THEN ICE---Replace ice when existing ice is almost melted  1 Discuss Treatment with your [facility] Practitioner and Use Only as Prescribed 2 Apply Insulation Barrier & Cold Therapy Pad 3 Check for Moisture 4 Inspect Skin Regularly  Tips and Trouble Shooting Usage Tips 1. Use cubed or chunked ice for optimal performance. 2. It is recommended to drain the Pad between uses. To drain the pad, hold the Pad upright with the hose pointed toward the ground. Depress the black plunger and allow water to drain out. 3. You may disconnect the Pad from the unit without removing the pad from the affected area by depressing the silver tabs on the hose coupling and gently pulling the hoses apart. The Pad and unit will seal itself and will not leak. Note: Some dripping during release is normal. 4. DO NOT RUN PUMP WITHOUT WATER! The pump in this unit is designed to run with water. Running the unit without water will cause permanent damage to the pump. 5. Unplug unit before removing lid.  TROUBLESHOOTING GUIDE Pump not running, Water not flowing to the  pad, Pad is not getting cold 1. Make sure the transformer is plugged into the wall outlet. 2. Confirm that the ice and water are filled to the indicated levels. 3. Make sure there are no kinks in the pad. 4. Gently pull on the blue tube to make sure the tube/pad junction is straight. 5. Remove the pad from the treatment site and ll it while the pad is lying at; then reapply. 6. Confirm that the pad couplings are securely attached to the unit. Listen for the double clicks (Figure 1) to confirm the pad couplings are securely attached.  Leaks    Note: Some condensation on the lines, controller, and pads is unavoidable, especially in warmer climates. 1. If using a [facility] Polar Care Cold Therapy unit with a detachable Cold Therapy Pad, and a leak exists (other than condensation on the lines) disconnect the pad couplings. Make sure the silver tabs on the couplings are depressed before reconnecting the pad to the pump hose; then confirm both  sides of the coupling are properly clicked in. 2. If the coupling continues to leak or a leak is detected in the pad itself, stop using it and call [facility] at [phone].  Cleaning After use, empty and dry the unit with a soft cloth. Warm water and mild detergent may be used occasionally to clean the pump and tubes.  WARNING: The Ness can be cold enough to cause serious injury, including full skin necrosis. Follow these Operating Instructions, and carefully read the Product Insert (see pouch on side of unit) and the Cold Therapy Pad Fitting Instructions (provided with each Cold Therapy Pad) prior to use.

## 2022-07-03 NOTE — Op Note (Signed)
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  Post-Op Diagnosis:   Same.  Procedure:   Reverse right total shoulder arthroplasty with biceps tenodesis.  Surgeon:   Pascal Lux, MD  Assistant:   [name] Proud, PA-C  Anesthesia:   General endotracheal with an interscalene block using Exparel placed preoperatively by the anesthesiologist.  Findings:   As above.  Complications:   None  EBL:   50 cc  Fluids:   300 cc crystalloid  UOP:   None  TT:   None  Drains:   None  Closure:   Staples  Implants:   All press-fit Integra system with an 11 mm stem, a small metaphyseal body, a +3 mm humeral platform, a mini baseplate, and a 38 mm concentric 2 mm mm laterally offset glenosphere.  Brief Clinical Note:   The patient is a 72 year old female with a long history of gradually worsening right shoulder pain and stiffness. Her symptoms have progressed despite medications, activity modification, etc. Her history and examination consistent with advanced degenerative joint disease, confirmed by both plain radiographs and MRI scan. The MRI scan also demonstrated significant rotator cuff tendinopathy with partial-thickness tearing and biceps tendinopathy. The patient presents at this time for a reverse right total shoulder arthroplasty.  Procedure:   The patient underwent placement of an interscalene block using Exparel by the anesthesiologist in the preoperative holding area before being brought into the operating room and lain in the supine position. The patient then underwent general endotracheal intubation and anesthesia before the patient was repositioned in the beach chair position using the beach chair positioner. The right shoulder and upper extremity were prepped with ChloraPrep solution before being draped sterilely. Preoperative antibiotics were  administered.   A timeout was performed to verify the appropriate surgical site before a standard anterior approach to the shoulder was made through an approximately 4-5 inch incision. The incision was carried down through the subcutaneous tissues to expose the deltopectoral fascia. The interval between the deltoid and pectoralis muscles was identified and this plane developed, retracting the cephalic vein laterally with the deltoid muscle. The conjoined tendon was identified. Its lateral margin was dissected and the Kolbel self-retraining retractor inserted. The "three sisters" were identified and cauterized. Bursal tissues were removed to improve visualization.   The biceps tendon was identified near the inferior aspect of the bicipital groove. A soft tissue tenodesis was performed by attaching the biceps tendon to the adjacent pectoralis major tendon using two #0 Ethibond interrupted sutures. The biceps tendon was then transected just proximal to the tenodesis site. The subscapularis tendon was released from its attachment to the lesser tuberosity 1 cm proximal to its insertion and several tagging sutures placed. The inferior capsule was released with care after identifying and protecting the axillary nerve. The proximal humeral cut was made at approximately 20 of retroversion using the extra-medullary guide.   Attention was redirected to the glenoid. The labrum was debrided circumferentially before the center of the glenoid was identified. The guidewire was drilled into the glenoid neck using the appropriate guide. After verifying its position, it was overreamed with the mini-baseplate reamer to create a flat surface before the stem reamer was utilized. The superior and inferior peg sites were reamed using the appropriate guide to complete the glenoid preparation. The permanent mini-baseplate was impacted into place. It was stabilized with a 15  x 4.5 mm central screw and four peripheral screws. Locking  caps were placed over the superior and inferior screws. The permanent 38 mm concentric glenosphere with +2 mm of lateral offset was then impacted into place and its Morse taper locking mechanism verified using manual distraction.  Attention was directed to the humeral side. The humeral canal was prepared utilizing the tapered stem reamers sequentially beginning with the 7 mm stem and progressing to an 11 mm stem. This demonstrated a good tight fit. The metaphyseal region was then prepared using the appropriate planar device. The trial stem and small metaphyseal body were put together on the back table and a trial reduction performed using the +0 mm and +3 mm inserts. With the +3 mm insert, the arm demonstrated excellent range of motion as the hand could be brought across the chest to the opposite shoulder and brought to the top of the patient's head and to the patient's ear. The shoulder appeared stable throughout this range of motion. The joint was dislocated and the trial components removed. The permanent 11 mm stem with the small body was impacted into place with care taken to maintain the appropriate version. A repeat trial reduction with the +3 mm insert again demonstrated excellent stability with the findings as described above. Therefore, the shoulder was re-dislocated and, after inserting the locking screw to secure the body to the stem, the permanent +3 mm insert impacted into place. After verifying its locking mechanism, the shoulder was relocated using two finger pressure and again placed through a range of motion with the findings as described above.  The wound was copiously irrigated with sterile saline solution using the jet lavage system before a total of 10 cc of Exparel and 30 cc of 0.5% Sensorcaine with epinephrine was injected into the pericapsular and peri-incisional tissues to help with postoperative analgesia. The subscapularis tendon was reapproximated using #2 FiberWire interrupted  sutures. The deltopectoral interval was closed using #0 Vicryl interrupted sutures before the subcutaneous tissues were closed using 2-0 Vicryl interrupted sutures. The skin was closed using staples. Prior to closing the skin, 1 g of transexemic acid in 10 cc of normal saline was injected intra-articularly to help with postoperative bleeding. A sterile occlusive dressing was applied to the wound before the arm was placed into a shoulder immobilizer with an abduction pillow. A Polar Care system also was applied to the shoulder. The patient was then transferred back to a hospital bed before being awakened, extubated, and returned to the recovery room in satisfactory condition after tolerating the procedure well.

## 2022-07-03 NOTE — H&P (Signed)
History of Present Illness: Jessica Cole is a 72 y.o. female who presents today for repeat evaluation of ongoing right shoulder pain and discussion of recent right shoulder MRI scan. The patient has been experiencing right shoulder pain over the past several months, she did not experience any specific injury or trauma affecting the right shoulder. She is right-hand dominant. When she was initially evaluated in early January x-rays of the right shoulder demonstrated significant osteoarthritic changes involving the right shoulder, she was sent for an MRI scan for evaluation of underlying rotator cuff tear as well. The patient is status post a left reverse total shoulder arthroplasty and has done very well with her left shoulder. The patient continues to have moderate to severe pain when attempting to use the right arm for daily activities of living. She is unable to reach above shoulder level. She does have increased discomfort when attempting sleep on the right side at night as well. The patient does have a history of diabetes but is well-controlled. She denies any personal history of heart attack, stroke, asthma or COPD. No personal history of blood clots.  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 Yves Dill)  Sleep apnea (on CPAP; followed by Pulmonology)  Thrombocytopenia (CMS-HCC) 05/16/2015   Past Surgical History: HYSTERECTOMY 1981  LEFT FOOT BUNIONECTOMY 2004 (Dr. Leanor Kail)  Right breast cystectomy 01/04/2004 (Dr. Jamal Collin)  RIGHT  SUBACROMIAL DECOMPRESSION AND ROTATOR CUFF REPAIR 06/08/2009 (Dr. Skip Estimable)  COLONOSCOPY 03/26/2012 (adenomatous, hyperplastic polyps)  Foot & Bunion Surgery 04/29/2012 (Dr. Roselind Messier)  KNEE ARTHROSCOPY Left 06/24/2013  LEFT KNEE ARTHROSCOPY, PARTIAL MEDIAL MENISECTOMY AND CHONDROPLASTY  PERCUTANEOUS SPINAL FUSION 12/2015  COLONOSCOPY 08/14/2017 (2 hyperplastic polyps,, diverticulosis,internal hemorrhoids/PHx CP/Repeat in 5 years/ [facility])  Reverse left total shoulder arthroplasty 03/01/2020 (Dr. Roland Rack)  APPENDECTOMY  History of breast biopsy 09/22/1998, 12/28/2012 (Rt Breast; Dr. Jamal Collin)  Laser eye surgery Left  LITHOTRIPSY  MUSCLE BIOPSY  SPINE SURGERY  TONSILLECTOMY   Past Family History: High blood pressure (Hypertension) Mother  Lung cancer Mother  Cancer Mother  Coronary Artery Disease Father  High blood pressure Father   Medications: albuterol 90 mcg/actuation inhaler Inhale 2 inhalations into the lungs every 6 (six) hours as needed for Wheezing 1 each 3  alendronate (FOSAMAX) 70 MG tablet Take 1 tablet (70 mg total) by mouth every 7 (seven) days Take with a full glass of water. Do not lie down for the next 30 min. 12 tablet 3  aspirin 81 MG EC tablet Take 81 mg by mouth 3 (three) times a week  azelastine (ASTELIN) 137 mcg nasal spray Place 1 spray into both nostrils 2 (two) times daily Uses at night  celecoxib (CELEBREX) 200 MG capsule Take 1 capsule (200 mg total) by mouth 2 (two) times daily 60 capsule 1  cholecalciferol (VITAMIN D3) 2,000 unit tablet Take 1 tablet (2,000 Units total) by mouth once daily 91 tablet 3  diclofenac (VOLTAREN) 1 % topical gel Apply 2 g topically 2 (two) times daily as needed  DULoxetine (CYMBALTA) 60 MG DR capsule TAKE 1 CAPSULE BY MOUTH EVERY DAY 90 capsule 3  empagliflozin (JARDIANCE) 10 mg tablet TAKE 1 TABLET BY MOUTH EVERY DAY 90 tablet 3  fluticasone propionate (FLONASE) 50 mcg/actuation nasal spray USE ONE SPRAY IN EACH NOSTRIL EVERY DAY  16 g 2  gabapentin (NEURONTIN) 800 MG tablet TAKE 1 TABLET BY MOUTH TWICE A DAY 180 tablet 1  HYDROcodone-acetaminophen (NORCO) 5-325 mg tablet Take by mouth every 8 (eight) hours  hydroxychloroquine (PLAQUENIL) 200 mg tablet Take 1 tablet (200 mg total) by mouth 2 (two) times daily 60 tablet 5  lancets Use 1 each 2 (two) times daily Use as instructed. 200 each 3  levothyroxine (SYNTHROID) 112 MCG tablet TAKE 1 TABLET BY MOUTH ON EMPTY STOMACH AT LEAST 30-60 MINS BEFORE BREAKFAST WITH A GLASS OF WATER 90 tablet 3  metFORMIN (GLUCOPHAGE) 1000 MG tablet TAKE 1/2 TABLET BY MOUTH TWICE A DAY WITH MEALS 90 tablet 3  metoprolol succinate (TOPROL-XL) 50 MG XL tablet  montelukast (SINGULAIR) 10 mg tablet TAKE 1 TABLET BY MOUTH EVERY DAY AT NIGHT 90 tablet 0  ONETOUCH ULTRA TEST test strip USE 2 (TWO) TIMES DAILY USE AS INSTRUCTED. 200 strip 1  pravastatin (PRAVACHOL) 20 MG tablet TAKE 1 TABLET BY MOUTH EVERY DAY AT NIGHT 90 tablet 3  rOPINIRole (REQUIP) 4 MG tablet TAKE 1 TABLET BY MOUTH 2 TIMES DAILY. 180 tablet 3  solifenacin (VESICARE) 5 MG tablet TAKE ONE TABLET TWICE DAILY 60 tablet 0  SUMAtriptan (IMITREX) 20 mg/actuation nasal spray Place 1 spray (20 mg total) into the left nostril as needed for Migraine (into the left nostril once as needed for Migraine for up to 1 dose May take a second dose after 2 hours if needed.) 12 each 3  telmisartan (MICARDIS) 80 MG tablet TAKE 1 TABLET BY MOUTH EVERY DAY 90 tablet 3   Current Facility-Administered Medications: Cyanocobalamin (VITAMIN B12) injection 1,000 mcg 1,000 mcg Intramuscular Q30 Days Sheliah Hatch III, MD 1,000 mcg at 06/06/22 1052   Allergies: Dexamethasone (Weakness, dropped BP, "felt out of it")  Diazepam Hallucination   Review of Systems:  A comprehensive 14 point ROS was performed, reviewed by me today, and the pertinent orthopaedic findings are documented in the HPI.  Physical Exam: BP 138/86  Ht 149.9 cm ('4\' 11"'$ )  Wt 80.7 kg  (178 lb)  BMI 35.95 kg/m  General/Constitutional: The patient appears to be well-nourished, well-developed, and in no acute distress. Neuro/Psych: Normal mood and affect, oriented to person, place and time. Eyes: Non-icteric. Pupils are equal, round, and reactive to light, and exhibit synchronous movement. ENT: Unremarkable. Lymphatic: No palpable adenopathy. Respiratory: Lungs clear to auscultation, Normal chest excursion, No wheezes, and Non-labored breathing Cardiovascular: Regular rate and rhythm. No murmurs. and No edema, swelling or tenderness, except as noted in detailed exam. Integumentary: No impressive skin lesions present, except as noted in detailed exam. Musculoskeletal: Unremarkable, except as noted in detailed exam.  Skin examination of the right shoulder demonstrates no open wound, erythema or ecchymosis. There is no deformity identified to the right shoulder at this time. The patient does have well-healed previous surgical incision sites from her previous right shoulder procedure. The patient does have tenderness to palpation on the posterior aspect the shoulder, lateral aspect of the shoulder over the subacromial space and over the anterior aspect of the shoulder. The patient does have decreased active and passive range of motion his right shoulder this time. Actively she is able to achieve close to 95 degrees of forward flexion, abduction close to 85 degrees. With the arm at her  side she is able to internally rotate across her body but does have limited external rotation beyond 5 degrees. The patient does have moderate crepitus with range of motion activities of the right shoulder. She is unable to touch the top of her head without significant discomfort, unable to reach behind her neck or across to her other shoulder at this time.  Imaging: AP, Y scapular and axillary views of the right shoulder were previous in the office and reviewed by me today. These x-rays demonstrate  significant right shoulder osteoarthritic changes with complete loss of the glenohumeral joint space. Moderate cystic formation is noted throughout the humeral head and glenoid. Moderate osteophyte formation along the underneath aspect of the humeral head. The subacromial space appears to be relatively well-maintained however there does appear to be a rather large osteophyte along the underneath aspect of the lateral acromion. No evidence of acute fracture or dislocation at today's visit.  MRI OF THE RIGHT SHOULDER:  1. Severe tendinosis of the supraspinatus tendon with a small  partial-thickness bursal surface tear anteriorly.  2. Severe tendinosis of the infraspinatus tendon.  3. Severe tendinosis of the subscapularis tendon.  4. Moderate tendinosis of the intra-articular portion of the long  head of the biceps tendon.  5. Severe osteoarthritis of the right glenohumeral joint.   Impression: 1. Primary osteoarthritis of right shoulder. 2. Severe obesity (BMI 35.0-39.9). 3. Nontraumatic incomplete tear of right rotator cuff. 4. Biceps tendonitis on right.  Plan:  1. Treatment options were discussed today with the patient. 2. MRI scan of the right shoulder does demonstrate significant right shoulder osteoarthritic changes with severe tendinosis of the supraspinatus with small partial-thickness tearing. 3. Given the changes of the rotator cuff and her significant osteoarthritic changes I believe the patient will benefit from a reverse total shoulder arthroplasty versus a anatomic total shoulder arthroplasty. 4. The patient is quite frustrated by her continued right shoulder discomfort and wishes to proceed with surgery at this time. She will be scheduled for a right reverse total shoulder arthroplasty with Dr. Roland Rack in the future. 5. The patient was instructed on the risk and benefits of surgical intervention and wishes to proceed at this time. This document will serve as the surgical history  physical for the patient. 6. They can call the clinic they have any questions, new symptoms develop or symptoms worsen.  The procedure was discussed with the patient, as were the potential risks (including bleeding, infection, nerve and/or blood vessel injury, persistent or recurrent pain, failure of the hardware, dislocation, axillary nerve injury, stress fracture, need for further surgery, blood clots, strokes, heart attacks and/or arhythmias, pneumonia, etc.) and benefits. The patient states her understanding and wishes to proceed.    H&P reviewed and patient re-examined. No changes.

## 2022-07-03 NOTE — Anesthesia Postprocedure Evaluation (Signed)
Anesthesia Post Note  Patient: Jessica Cole  Procedure(s) Performed: REVERSE SHOULDER ARTHROPLASTY W/ BICEPS TENODESIS. (Right: Shoulder)  Patient location during evaluation: PACU Anesthesia Type: General Level of consciousness: awake and alert Pain management: pain level controlled Vital Signs Assessment: post-procedure vital signs reviewed and stable Respiratory status: spontaneous breathing, nonlabored ventilation and respiratory function stable Cardiovascular status: blood pressure returned to baseline and stable Postop Assessment: no apparent nausea or vomiting Anesthetic complications: no   There were no known notable events for this encounter.   Last Vitals:  Vitals:   07/03/22 1339 07/03/22 1359  BP: (!) 98/41 101/62  Pulse: 84 83  Resp: (!) 21 18  Temp: 36.6 C 36.6 C  SpO2: 93% 94%    Last Pain:  Vitals:   07/03/22 1359  TempSrc: Temporal  PainSc: 0-No pain                 Iran Ouch

## 2022-07-03 NOTE — Evaluation (Signed)
Occupational Therapy Evaluation Patient Details Name: Jessica Cole MRN: [id number] DOB: 03/27/1951 Today's Date: 07/03/2022   History of Present Illness Pt is 72 y/o female s/p reverse right total shoulder arthroplasty with biceps tenodesis.   Clinical Impression   Upon entering the room, pt seated in recliner chair with husband present for hands on education. Pt reports living at home with husband and being independent at baseline. Husband is available 24/7 at discharge. OT reviewed, NWB precautions, polar care system, sling instructions, hand/wrist/elbow exercises, and how to increase Ind in self care with precautions. Pt needing min A to don pants and ambulates with min guard to bathroom for toileting needs. Pt performs hygiene and needs min A for clothing management before returning to recliner chair. Pt's husband demonstrated teach back of skills in order to don pull over shirt, sling, and polar care during session. Pt and spouse with no further questions at this time. OT to complete order.      Recommendations for follow up therapy are one component of a multi-disciplinary discharge planning process, led by the attending physician.  Recommendations may be updated based on patient status, additional functional criteria and insurance authorization.   Follow Up Recommendations  Follow physician's recommendations for discharge plan and follow up therapies     Assistance Recommended at Discharge Intermittent Supervision/Assistance  Patient can return home with the following A little help with walking and/or transfers;A lot of help with bathing/dressing/bathroom;Assistance with cooking/housework;Assist for transportation;Help with stairs or ramp for entrance    Functional Status Assessment  Patient has had a recent decline in their functional status and demonstrates the ability to make significant improvements in function in a reasonable and predictable amount of time.  Equipment  Recommendations  None recommended by OT       Precautions / Restrictions Precautions Precautions: Shoulder Shoulder Interventions: Timmothy Sours joy ultra sling;Shoulder abduction pillow;At all times;Off for dressing/bathing/exercises Restrictions Weight Bearing Restrictions: Yes RUE Weight Bearing: Non weight bearing      Mobility Bed Mobility               General bed mobility comments: seated in recliner chair    Transfers Overall transfer level: Needs assistance Equipment used: 1 person hand held assist Transfers: Sit to/from Stand, Bed to chair/wheelchair/BSC Sit to Stand: Supervision           General transfer comment: supervision - min guard as pt reports feeling "off" from surgery          ADL either performed or assessed with clinical judgement   ADL                                         General ADL Comments: education regarding how to assist with ADLs and maintain NWB. Paper handout provided as well. Min guard - min A for toileting and LB self care. Mod A for UB self care and total A to manage polar care and sling.     Vision Patient Visual Report: No change from baseline              Pertinent Vitals/Pain Pain Assessment Pain Assessment: No/denies pain     Hand Dominance Right   Extremity/Trunk Assessment Upper Extremity Assessment Upper Extremity Assessment: RUE deficits/detail;Overall [facility] for tasks assessed RUE Deficits / Details: surgical intervention- not tested   Lower Extremity Assessment Lower Extremity Assessment: Overall WFL for tasks assessed  Communication Communication Communication: No difficulties   Cognition Arousal/Alertness: Awake/alert Behavior During Therapy: WFL for tasks assessed/performed Overall Cognitive Status: Within Functional Limits for tasks assessed                                                  Home Living Family/patient expects to be discharged to:: Private  residence Living Arrangements: Spouse/significant other Available Help at Discharge: Family;Available 24 hours/day Type of Home: House Home Access: Stairs to enter [employer] of Steps: 1 Entrance Stairs-Rails: None Home Layout: One level     Bathroom Shower/Tub: Teacher, early years/pre: Standard Bathroom Accessibility: Yes   Home Equipment: Conservation officer, nature (2 wheels);Cane - quad;Cane - single point;Hand held shower head          Prior Functioning/Environment Prior Level of Function : Independent/Modified Independent               ADLs Comments: Pt reports Ind at baseline                 OT Goals(Current goals can be found in the care plan section) Acute Rehab OT Goals Patient Stated Goal: to go home OT Goal Formulation: With patient/family Time For Goal Achievement: 07/03/22 Potential to Achieve Goals: Good  OT Frequency:         AM-PAC OT "6 Clicks" Daily Activity     Outcome Measure Help from another person eating meals?: None Help from another person taking care of personal grooming?: None Help from another person toileting, which includes using toliet, bedpan, or urinal?: A Little Help from another person bathing (including washing, rinsing, drying)?: A Little Help from another person to put on and taking off regular upper body clothing?: A Lot Help from another person to put on and taking off regular lower body clothing?: A Little 6 Click Score: 19   End of Session Nurse Communication: Mobility status  Activity Tolerance: Patient tolerated treatment well Patient left: in chair;with family/visitor present                   Time: [id number] OT Time Calculation (min): 32 min Charges:  OT General Charges $OT Visit: 1 Visit OT Evaluation $OT Eval Moderate Complexity: 1 Mod OT Treatments $Self Care/Home Management : 23-37 mins  Darleen Crocker, MS, OTR/L , CBIS ascom [phone]  07/03/22, 3:53 PM

## 2022-07-03 NOTE — Anesthesia Procedure Notes (Signed)
Anesthesia Regional Block: Interscalene brachial plexus block   Pre-Anesthetic Checklist: , timeout performed,  Correct Patient, Correct Site, Correct Laterality,  Correct Procedure, Correct Position, site marked,  Risks and benefits discussed,  Surgical consent,  Pre-op evaluation,  At surgeon's request and post-op pain management  Laterality: Right  Prep: chloraprep       Needles:  Injection technique: Single-shot  Needle Type: Stimiplex     Needle Length: 9cm  Needle Gauge: 22     Additional Needles:   Procedures:,,,, ultrasound used (permanent image in chart),,    Narrative:  Start time: 07/03/2022 9:40 AM End time: 07/03/2022 9:45 AM Injection made incrementally with aspirations every 5 mL.  Performed by: Personally  Anesthesiologist: Iran Ouch, MD  Additional Notes: Patient consented for risk and benefits of nerve block including but not limited to nerve damage, failed block, bleeding and infection.  Patient voiced understanding.  Functioning IV was confirmed and monitors were applied.  Timeout done prior to procedure and prior to any sedation being given to the patient.  Patient confirmed procedure site prior to any sedation given to the patient. Sterile prep,hand hygiene and sterile gloves were used.  Minimal sedation used for procedure.  No paresthesia endorsed by patient during the procedure.  Negative aspiration and negative test dose prior to incremental administration of local anesthetic. The patient tolerated the procedure well with no immediate complications.

## 2022-07-03 NOTE — Anesthesia Procedure Notes (Signed)
Procedure Name: Intubation Date/Time: 07/03/2022 11:08 AM  Performed by: Patience Musca., CRNAPre-anesthesia Checklist: Patient identified, Patient being monitored, Timeout performed, Emergency Drugs available and Suction available Patient Re-evaluated:Patient Re-evaluated prior to induction Oxygen Delivery Method: Circle system utilized Preoxygenation: Pre-oxygenation with 100% oxygen Induction Type: IV induction Ventilation: Mask ventilation without difficulty Laryngoscope Size: 3 and McGraph Grade View: Grade I Tube type: Oral Tube size: 6.5 mm Number of attempts: 1 Airway Equipment and Method: Stylet Placement Confirmation: ETT inserted through vocal cords under direct vision, positive ETCO2 and breath sounds checked- equal and bilateral Secured at: 19 cm Tube secured with: Tape Dental Injury: Teeth and Oropharynx as per pre-operative assessment

## 2022-07-04 ENCOUNTER — Encounter: Payer: Self-pay | Admitting: Surgery

## 2022-07-07 LAB — SURGICAL PATHOLOGY

## 2022-07-22 ENCOUNTER — Ambulatory Visit: Payer: Medicare HMO | Attending: Student

## 2022-07-22 DIAGNOSIS — M25511 Pain in right shoulder: Secondary | ICD-10-CM | POA: Insufficient documentation

## 2022-07-22 DIAGNOSIS — M25611 Stiffness of right shoulder, not elsewhere classified: Secondary | ICD-10-CM | POA: Insufficient documentation

## 2022-07-22 DIAGNOSIS — M6281 Muscle weakness (generalized): Secondary | ICD-10-CM | POA: Insufficient documentation

## 2022-07-22 NOTE — Therapy (Signed)
[facility] [address], [place], [address] Phone: [phone]   Fax:  [phone]  Physical Therapy Evaluation  Patient Details  Name: Jessica Cole MRN: [id number] Date of Birth: [birth year] Referring Provider (PT): [name] Proud, [place]   Encounter Date: 07/22/2022   PT End of Session - 07/22/22 0851     Visit Number 1    Number of Visits 25    Date for PT Re-Evaluation 10/16/22    PT Start Time 0851    PT Stop Time 0931    PT Time Calculation (min) 40 min    Activity Tolerance Patient tolerated treatment well    Behavior During Therapy [facility] for tasks assessed/performed             Past Medical History:  Diagnosis Date   Anxiety    Asthma    seasonal with allergies   Colon polyps    Degenerative arthritis    Depression    Environmental allergies    Family history of adverse reaction to anesthesia    a.) PONV in 1st degree relative (sister)   Fatty liver    Glaucoma    History of bronchitis    History of kidney stones    History of pneumonia    Hypertension    Hypothyroidism    Migraines    Nose colonized with MRSA 02/14/2021   a.) preoperative PCR (+) on 02/14/2021 prior to LUMBAR MICRODISCECTOMY; b.) preoperative PCR (+) 06/24/2022 prior to [place] TENODESIS   Obesity    OSA on CPAP    Plantar fasciitis    right   Pneumonia    Renal calculi    Rheumatoid arthritis (HCC)    RLS (restless legs syndrome) 08/23/2014   T2DM (type 2 diabetes mellitus) ([place])     Past Surgical History:  Procedure Laterality Date   ABDOMINAL HYSTERECTOMY     partial   APPENDECTOMY     Arthroscopic surgery, knee Left    BREAST BIOPSY Right    several   BREAST BIOPSY Right 03/11/2017   u/s bx neg   BUNIONECTOMY     LEFT    COLONOSCOPY W/ POLYPECTOMY     COLONOSCOPY WITH PROPOFOL N/A 08/14/2017   Procedure: COLONOSCOPY WITH PROPOFOL;  Surgeon: Lollie Sails, MD;   Location: [facility] ENDOSCOPY;  Service: Endoscopy;  Laterality: N/A;   EXTRACORPOREAL SHOCK WAVE LITHOTRIPSY Left 05/14/2017   Procedure: EXTRACORPOREAL SHOCK WAVE LITHOTRIPSY (ESWL);  Surgeon: Abbie Sons, MD;  Location: [facility] ORS;  Service: Urology;  Laterality: Left;   EXTRACORPOREAL SHOCK WAVE LITHOTRIPSY Left 06/14/2020   Procedure: EXTRACORPOREAL SHOCK WAVE LITHOTRIPSY (ESWL);  Surgeon: Billey Co, MD;  Location: [facility] ORS;  Service: Urology;  Laterality: Left;   FOOT SURGERY     RIGHT     KNEE ARTHROSCOPY W/ MENISCAL REPAIR Left    LASIK     LUMBAR LAMINECTOMY/DECOMPRESSION MICRODISCECTOMY Right 03/01/2021   Procedure: Microdiscectomy - Lumbar five-Sacral one - right;  Surgeon: Eustace Moore, MD;  Location: [place];  Service: Neurosurgery;  Laterality: Right;   MAXIMUM ACCESS (MAS)POSTERIOR LUMBAR INTERBODY FUSION (PLIF) 1 LEVEL N/A 04/25/2016   Procedure: LUMBAR FOUR-FIVE  MAXIMUM ACCESS (MAS) POSTERIOR LUMBAR INTERBODY FUSION (PLIF) with extension of instrumentation LUMBAR TWO-FIVE;  Surgeon: Eustace Moore, MD;  Location: [place];  Service: Neurosurgery;  Laterality: N/A;   MAXIMUM ACCESS (MAS)POSTERIOR LUMBAR INTERBODY FUSION (PLIF) 2 LEVEL N/A 12/13/2015   Procedure: Lumbar  two-three - Lumbar three-four MAXIMUM ACCESS (MAS) POSTERIOR LUMBAR INTERBODY FUSION (PLIF)  ;  Surgeon: Eustace Moore, MD;  Location: [facility] NEURO ORS;  Service: Neurosurgery;  Laterality: N/A;   REVERSE SHOULDER ARTHROPLASTY Left 03/01/2020   Procedure: REVERSE SHOULDER ARTHROPLASTY;  Surgeon: Corky Mull, MD;  Location: [facility] ORS;  Service: Orthopedics;  Laterality: Left;   REVERSE SHOULDER ARTHROPLASTY Right 07/03/2022   Procedure: REVERSE SHOULDER ARTHROPLASTY W/ BICEPS TENODESIS.;  Surgeon: Corky Mull, MD;  Location: [facility] ORS;  Service: Orthopedics;  Laterality: Right;   SHOULDER SURGERY     RIGHT    TONSILLECTOMY      There were no vitals filed for this visit.    Subjective Assessment - 07/22/22 0852      Subjective R shoulder: 0.5/10 currently.    Pertinent History S/P R reverse total shoulder replacement on 07/03/2022. Feels a little bit aggravated currently after taking a shower but usually goes away. Has had a couple of PT visits for home health. Exercises included pendulums and PROM.    Currently in Pain? Yes    Pain Score 1    0.5/10   Pain Location Shoulder    Pain Orientation Right    Pain Type Chronic pain;Surgical pain    Pain Frequency Occasional                [facility] PT Assessment - 07/22/22 0849       Assessment   Medical Diagnosis Reverse right total shoulder arthroplasty with biceps tenodesis.    Referring Provider (PT) [name] Proud, PA-C    Onset Date/Surgical Date 07/03/22    Hand Dominance Right    Prior Therapy Home health PT      Precautions   Precaution Comments PROM, no reaching behind back      Restrictions   Other Position/Activity Restrictions PROM, no reaching behind back      Balance Screen   Has the patient fallen in the past 6 months No   but had close calls.   Has the patient had a decrease in activity level because of a fear of falling?  No    Is the patient reluctant to leave their home because of a fear of falling?  No      Posture/Postural Control   Posture Comments Pt wearing sling R UE, no abduction pillow. forward neg, kyphosis, R shoulder higher      PROM   Right Shoulder Flexion 95 Degrees   standing, bent over   Right Shoulder ABduction 72 Degrees   stiff end feel   Right Shoulder Internal Rotation --   supine, neutral position with pillow under R arm: pt able to rest hand on abdomen   Right Shoulder External Rotation 33 Degrees   scapular plane     Strength   Overall Strength Comments UE strength not tested at this time to allow for more healing.                        Objective measurements completed on examination: See above findings.   Therapeutic exercises  Supine PROM with PT Abduction  10x3  Scaption 10x3  Flexion 10x3  Cues to not utilize R shoulder muscles   Response to treatment Pt tolerated session well without aggravation of symptoms.   Clinical impression Pt is a 72 year old female who came to physical therapy S/P R reverse total shoulder arthroplasty with biceps tenodesis on 07/03/2022. She currently presents with R UE  weakness, limited R shoulder ROM, and currently unable to actively use her R UE for functional tasks. Pt will benefit from skilled physical therapy services to address the aforementioned deficits.                PT Short Term Goals - 07/22/22 1032       PT SHORT TERM GOAL #1   Title Patient will be independent with her initial HEP to improve ROM and R UE function.    Time 3    Period Weeks    Status New    Target Date 08/14/22               PT Long Term Goals - 07/22/22 1033       PT LONG TERM GOAL #1   Title Pt will have a leat 120 degrees R shoulder flexion and abduction A/AROM to promote ability to reach and use her R UE for functional tasks.    Baseline R shoulder PROM 95 degrees flexion, 72 degrees abduction (07/22/2022)    Time 12    Period Weeks    Status New    Target Date 10/16/22      PT LONG TERM GOAL #2   Title Pt will improve R shoulder ER A/AROM to at least 60 degrees to promote ability to reach behind her head.    Baseline R shoulder ER PROM in scapular plane: 33 degrees (07/22/2022)    Time 12    Period Weeks    Status New    Target Date 10/16/22      PT LONG TERM GOAL #3   Title Pt will have at least 4/5 R shoulder strength at all planes to promote ability to use her R UE for functional tasks.    Baseline R shoulder strength not tested yet to allow for more healing (07/22/2022)    Time 12    Period Weeks    Status New    Target Date 10/16/22      PT LONG TERM GOAL #4   Title Pt will improve R shoulder FOTO score to at least 50 as a demonstration of improved function.    Baseline R shoulder  FOTO score 34 (07/22/2022)    Time 12    Period Weeks    Status New    Target Date 10/16/22                    Plan - 07/22/22 1053     Clinical Impression Statement Pt is a 72 year old female who came to physical therapy S/P R reverse total shoulder arthroplasty with biceps tenodesis on 07/03/2022. She currently presents with R UE weakness, limited R shoulder ROM, and currently unable to actively use her R UE for functional tasks. Pt will benefit from skilled physical therapy services to address the aforementioned deficits.   ?    Personal Factors and Comorbidities Comorbidity 3+;Age;Past/Current Experience;Time since onset of injury/illness/exacerbation;Fitness    Comorbidities Anxiety, depression, HTN, DM, arthritis    Examination-Activity Limitations Bathing;Carry;Lift;Bed Mobility;Toileting;Dressing;Reach Overhead;Caring for Others;Self Feeding;Hygiene/Grooming    Stability/Clinical Decision Making Stable/Uncomplicated    Clinical Decision Making Low    Rehab Potential Fair    PT Frequency 2x / week    PT Duration 12 weeks    PT Treatment/Interventions Therapeutic activities;Therapeutic exercise;Neuromuscular re-education;Patient/family education;Manual techniques;Aquatic Therapy;Electrical Stimulation    PT Next Visit Plan PROM, manual techniques, modalities PRN    Consulted and Agree with Plan of Care Patient  Patient will benefit from skilled therapeutic intervention in order to improve the following deficits and impairments:  Pain, Postural dysfunction, Impaired UE functional use, Decreased strength, Decreased range of motion  Visit Diagnosis: Right shoulder pain, unspecified chronicity - Plan: PT plan of care cert/re-cert  Stiffness of right shoulder, not elsewhere classified - Plan: PT plan of care cert/re-cert     Problem List Patient Active Problem List   Diagnosis Date Noted   S/P lumbar laminectomy 03/01/2021   Mild nonproliferative  diabetic retinopathy of both eyes without macular edema associated with type 2 diabetes mellitus ([place]) 01/03/2019   Chronic cough 11/19/2016   Morbid obesity ([place]) 09/26/2016   Left ovarian cyst 05/30/2016   S/P lumbar spinal fusion 12/13/2015   Aortic calcification ([place]) 07/18/2015   Controlled type 2 diabetes mellitus without complication ([place]) Q000111Q   Thrombocytopenia ([place]) 05/16/2015   Recurrent major depressive disorder, in full remission ([place]) 05/16/2015   Essential (primary) hypertension 05/16/2015   Fatty infiltration of liver 03/15/2015   Aortic valve stenosis, nonrheumatic 01/18/2015   Sciatica of right side 01/09/2015   Type 2 diabetes mellitus ([place]) 11/10/2014   RLS (restless legs syndrome) 08/23/2014   Neuritis or radiculitis due to rupture of lumbar intervertebral disc 06/13/2014   Degeneration of intervertebral disc of lumbar region 06/13/2014   Lumbar radiculitis 06/13/2014   Arthritis 01/23/2014   Arthritis of knee, degenerative 11/15/2013   Depression 09/29/2013   Insomnia 09/29/2013   Apnea, sleep 08/29/2013   History of nephrolithiasis 08/29/2013   Abnormal presence of protein in urine 08/29/2013   Headache, migraine 08/29/2013   BP (high blood pressure) 08/29/2013   HLD (hyperlipidemia) 08/29/2013   Allergic rhinitis 08/29/2013   Intractable migraine without aura 02/18/2013   Joneen Boers PT, DPT  07/22/2022, 1:37 PM  [facility] [address], [place], [address] Phone: [phone]   Fax:  [phone]  Name: GABBI PENDELL MRN: [id number] Date of Birth: [birth year]

## 2022-07-24 ENCOUNTER — Ambulatory Visit: Payer: Medicare HMO

## 2022-07-24 DIAGNOSIS — M25611 Stiffness of right shoulder, not elsewhere classified: Secondary | ICD-10-CM

## 2022-07-24 DIAGNOSIS — M25511 Pain in right shoulder: Secondary | ICD-10-CM | POA: Diagnosis not present

## 2022-07-24 NOTE — Therapy (Signed)
OUTPATIENT PHYSICAL THERAPY TREATMENT NOTE   Patient Name: Jessica Cole MRN: [id number] DOB:1950/12/25, 72 y.o., female Today's Date: 07/24/2022  PCP: Adin Hector, MD  REFERRING PROVIDER: Lattie Corns, PA-C   END OF SESSION:  PT End of Session - 07/24/22 1500     Visit Number 2    Number of Visits 25    Date for PT Re-Evaluation 10/16/22    Progress Note Due on Visit 10    PT Start Time 1501    PT Stop Time 1530    PT Time Calculation (min) 29 min    Activity Tolerance Patient tolerated treatment well    Behavior During Therapy WFL for tasks assessed/performed             Past Medical History:  Diagnosis Date   Anxiety    Asthma    seasonal with allergies   Colon polyps    Degenerative arthritis    Depression    Environmental allergies    Family history of adverse reaction to anesthesia    a.) PONV in 1st degree relative (sister)   Fatty liver    Glaucoma    History of bronchitis    History of kidney stones    History of pneumonia    Hypertension    Hypothyroidism    Migraines    Nose colonized with MRSA 02/14/2021   a.) preoperative PCR (+) on 02/14/2021 prior to LUMBAR MICRODISCECTOMY; b.) preoperative PCR (+) 06/24/2022 prior to [place] TENODESIS   Obesity    OSA on CPAP    Plantar fasciitis    right   Pneumonia    Renal calculi    Rheumatoid arthritis (HCC)    RLS (restless legs syndrome) 08/23/2014   T2DM (type 2 diabetes mellitus) ([place])    Past Surgical History:  Procedure Laterality Date   ABDOMINAL HYSTERECTOMY     partial   APPENDECTOMY     Arthroscopic surgery, knee Left    BREAST BIOPSY Right    several   BREAST BIOPSY Right 03/11/2017   u/s bx neg   BUNIONECTOMY     LEFT    COLONOSCOPY W/ POLYPECTOMY     COLONOSCOPY WITH PROPOFOL N/A 08/14/2017   Procedure: COLONOSCOPY WITH PROPOFOL;  Surgeon: Lollie Sails, MD;  Location: [facility] ENDOSCOPY;  Service: Endoscopy;  Laterality: N/A;    EXTRACORPOREAL SHOCK WAVE LITHOTRIPSY Left 05/14/2017   Procedure: EXTRACORPOREAL SHOCK WAVE LITHOTRIPSY (ESWL);  Surgeon: Abbie Sons, MD;  Location: [facility] ORS;  Service: Urology;  Laterality: Left;   EXTRACORPOREAL SHOCK WAVE LITHOTRIPSY Left 06/14/2020   Procedure: EXTRACORPOREAL SHOCK WAVE LITHOTRIPSY (ESWL);  Surgeon: Billey Co, MD;  Location: [facility] ORS;  Service: Urology;  Laterality: Left;   FOOT SURGERY     RIGHT     KNEE ARTHROSCOPY W/ MENISCAL REPAIR Left    LASIK     LUMBAR LAMINECTOMY/DECOMPRESSION MICRODISCECTOMY Right 03/01/2021   Procedure: Microdiscectomy - Lumbar five-Sacral one - right;  Surgeon: Eustace Moore, MD;  Location: [place];  Service: Neurosurgery;  Laterality: Right;   MAXIMUM ACCESS (MAS)POSTERIOR LUMBAR INTERBODY FUSION (PLIF) 1 LEVEL N/A 04/25/2016   Procedure: LUMBAR FOUR-FIVE  MAXIMUM ACCESS (MAS) POSTERIOR LUMBAR INTERBODY FUSION (PLIF) with extension of instrumentation LUMBAR TWO-FIVE;  Surgeon: Eustace Moore, MD;  Location: [place];  Service: Neurosurgery;  Laterality: N/A;   MAXIMUM ACCESS (MAS)POSTERIOR LUMBAR INTERBODY FUSION (PLIF) 2 LEVEL N/A 12/13/2015   Procedure: Lumbar two-three - Lumbar three-four MAXIMUM  ACCESS (MAS) POSTERIOR LUMBAR INTERBODY FUSION (PLIF)  ;  Surgeon: Eustace Moore, MD;  Location: [facility] NEURO ORS;  Service: Neurosurgery;  Laterality: N/A;   REVERSE SHOULDER ARTHROPLASTY Left 03/01/2020   Procedure: REVERSE SHOULDER ARTHROPLASTY;  Surgeon: Corky Mull, MD;  Location: [facility] ORS;  Service: Orthopedics;  Laterality: Left;   REVERSE SHOULDER ARTHROPLASTY Right 07/03/2022   Procedure: REVERSE SHOULDER ARTHROPLASTY W/ BICEPS TENODESIS.;  Surgeon: Corky Mull, MD;  Location: [facility] ORS;  Service: Orthopedics;  Laterality: Right;   SHOULDER SURGERY     RIGHT    TONSILLECTOMY     Patient Active Problem List   Diagnosis Date Noted   S/P lumbar laminectomy 03/01/2021   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 edema associated with type 2 diabetes mellitus ([place]) 01/03/2019   Chronic cough 11/19/2016   Morbid obesity ([place]) 09/26/2016   Left ovarian cyst 05/30/2016   S/P lumbar spinal fusion 12/13/2015   Aortic calcification ([place]) 07/18/2015   Controlled type 2 diabetes mellitus without complication ([place]) Q000111Q   Thrombocytopenia ([place]) 05/16/2015   Recurrent major depressive disorder, in full remission ([place]) 05/16/2015   Essential (primary) hypertension 05/16/2015   Fatty infiltration of liver 03/15/2015   Aortic valve stenosis, nonrheumatic 01/18/2015   Sciatica of right side 01/09/2015   Type 2 diabetes mellitus ([place]) 11/10/2014   RLS (restless legs syndrome) 08/23/2014   Neuritis or radiculitis due to rupture of lumbar intervertebral disc 06/13/2014   Degeneration of intervertebral disc of lumbar region 06/13/2014   Lumbar radiculitis 06/13/2014   Arthritis 01/23/2014   Arthritis of knee, degenerative 11/15/2013   Depression 09/29/2013   Insomnia 09/29/2013   Apnea, sleep 08/29/2013   History of nephrolithiasis 08/29/2013   Abnormal presence of protein in urine 08/29/2013   Headache, migraine 08/29/2013   BP (high blood pressure) 08/29/2013   HLD (hyperlipidemia) 08/29/2013   Allergic rhinitis 08/29/2013   Intractable migraine without aura 02/18/2013    REFERRING DIAG: Reverse right total shoulder arthroplasty with biceps tenodesis.    THERAPY DIAG:  Right shoulder pain, unspecified chronicity  Stiffness of right shoulder, not elsewhere classified  Rationale for Evaluation and Treatment Rehabilitation  PERTINENT HISTORY: S/P R reverse total shoulder replacement on 07/03/2022. Feels a little bit aggravated currently after taking a shower but usually goes away. Has had a couple of PT visits for home health. Exercises included pendulums and PROM.   PRECAUTIONS: PROM during initial weeks, no reaching behind back   SUBJECTIVE:   SUBJECTIVE STATEMENT: R shoulder feels  very good. No pain currently. Also has L LE sciatica which may be coming from her low back.   PAIN:  Are you having pain? See subjective   TODAY'S TREATMENT:  DATE: 07/24/2022      Therapeutic exercises   Supine PROM with PT Abduction 10x3   Scaption 10x3   Flexion 10x3  Elbow flex/ext 10x   Seated  B scapular retraction 10x3 with 5 second holds   Add seated R shoulder PROM table slides flexion and abduction next visit and give as part of HEP next session if appropriate.      Cues to not utilize R shoulder muscles     Response to treatment Pt tolerated session well without aggravation of symptoms.    Clinical impression Worked on R shoulder PROM to decrease stiffness. Cues to not use activate R shoulder muscles as well as to not weight bear on R UE with sit <> stand transfers. Improved R shoulder flexion and abduction PROM observed afterwards. Pt tolerated session well without aggravation of symptoms. Pt will benefit from continued skilled physical therapy services to improve ROM, strength, and function.        PATIENT EDUCATION: Education details: there-ex Person educated: Patient Education method: Explanation and Verbal cues Education comprehension: verbalized understanding and returned demonstration  HOME EXERCISE PROGRAM:    PT Short Term Goals - 07/22/22 1032       PT SHORT TERM GOAL #1   Title Patient will be independent with her initial HEP to improve ROM and R UE function.    Time 3    Period Weeks    Status New    Target Date 08/14/22              PT Long Term Goals - 07/22/22 1033       PT LONG TERM GOAL #1   Title Pt will have a leat 120 degrees R shoulder flexion and abduction A/AROM to promote ability to reach and use her R UE for functional tasks.    Baseline R shoulder PROM 95 degrees flexion,  72 degrees abduction (07/22/2022)    Time 12    Period Weeks    Status New    Target Date 10/16/22      PT LONG TERM GOAL #2   Title Pt will improve R shoulder ER A/AROM to at least 60 degrees to promote ability to reach behind her head.    Baseline R shoulder ER PROM in scapular plane: 33 degrees (07/22/2022)    Time 12    Period Weeks    Status New    Target Date 10/16/22      PT LONG TERM GOAL #3   Title Pt will have at least 4/5 R shoulder strength at all planes to promote ability to use her R UE for functional tasks.    Baseline R shoulder strength not tested yet to allow for more healing (07/22/2022)    Time 12    Period Weeks    Status New    Target Date 10/16/22      PT LONG TERM GOAL #4   Title Pt will improve R shoulder FOTO score to at least 50 as a demonstration of improved function.    Baseline R shoulder FOTO score 34 (07/22/2022)    Time 12    Period Weeks    Status New    Target Date 10/16/22              Plan - 07/24/22 1459     Clinical Impression Statement Worked on R shoulder PROM to decrease stiffness. Cues to not use activate R shoulder muscles as well as to not weight bear on R UE  with sit <> stand transfers. Improved R shoulder flexion and abduction PROM observed afterwards. Pt tolerated session well without aggravation of symptoms. Pt will benefit from continued skilled physical therapy services to improve ROM, strength, and function.    Personal Factors and Comorbidities Comorbidity 3+;Age;Past/Current Experience;Time since onset of injury/illness/exacerbation;Fitness    Comorbidities Anxiety, depression, HTN, DM, arthritis    Examination-Activity Limitations Bathing;Carry;Lift;Bed Mobility;Toileting;Dressing;Reach Overhead;Caring for Others;Self Feeding;Hygiene/Grooming    Stability/Clinical Decision Making Stable/Uncomplicated    Rehab Potential Fair    PT Frequency 2x / week    PT Duration 12 weeks    PT Treatment/Interventions Therapeutic  activities;Therapeutic exercise;Neuromuscular re-education;Patient/family education;Manual techniques;Aquatic Therapy;Electrical Stimulation    PT Next Visit Plan PROM, manual techniques, modalities PRN    Consulted and Agree with Plan of Care Patient              Joneen Boers PT, DPT  07/24/2022, 3:39 PM

## 2022-07-29 ENCOUNTER — Ambulatory Visit: Payer: Medicare HMO

## 2022-07-29 DIAGNOSIS — M6281 Muscle weakness (generalized): Secondary | ICD-10-CM

## 2022-07-29 DIAGNOSIS — M25511 Pain in right shoulder: Secondary | ICD-10-CM

## 2022-07-29 DIAGNOSIS — M25611 Stiffness of right shoulder, not elsewhere classified: Secondary | ICD-10-CM

## 2022-07-29 NOTE — Therapy (Signed)
OUTPATIENT PHYSICAL THERAPY TREATMENT NOTE   Patient Name: Jessica Cole MRN: [id number] DOB:1951/01/20, 72 y.o., female Today's Date: 07/29/2022  PCP: Adin Hector, MD  REFERRING PROVIDER: Lattie Corns, PA-C   END OF SESSION:  PT End of Session - 07/29/22 1106     Visit Number 3    Number of Visits 25    Date for PT Re-Evaluation 10/16/22    Progress Note Due on Visit 10    PT Start Time 1106    PT Stop Time 1139    PT Time Calculation (min) 33 min    Activity Tolerance Patient tolerated treatment well    Behavior During Therapy WFL for tasks assessed/performed              Past Medical History:  Diagnosis Date   Anxiety    Asthma    seasonal with allergies   Colon polyps    Degenerative arthritis    Depression    Environmental allergies    Family history of adverse reaction to anesthesia    a.) PONV in 1st degree relative (sister)   Fatty liver    Glaucoma    History of bronchitis    History of kidney stones    History of pneumonia    Hypertension    Hypothyroidism    Migraines    Nose colonized with MRSA 02/14/2021   a.) preoperative PCR (+) on 02/14/2021 prior to LUMBAR MICRODISCECTOMY; b.) preoperative PCR (+) 06/24/2022 prior to [place] TENODESIS   Obesity    OSA on CPAP    Plantar fasciitis    right   Pneumonia    Renal calculi    Rheumatoid arthritis (HCC)    RLS (restless legs syndrome) 08/23/2014   T2DM (type 2 diabetes mellitus) ([place])    Past Surgical History:  Procedure Laterality Date   ABDOMINAL HYSTERECTOMY     partial   APPENDECTOMY     Arthroscopic surgery, knee Left    BREAST BIOPSY Right    several   BREAST BIOPSY Right 03/11/2017   u/s bx neg   BUNIONECTOMY     LEFT    COLONOSCOPY W/ POLYPECTOMY     COLONOSCOPY WITH PROPOFOL N/A 08/14/2017   Procedure: COLONOSCOPY WITH PROPOFOL;  Surgeon: Lollie Sails, MD;  Location: [facility] ENDOSCOPY;  Service: Endoscopy;  Laterality: N/A;    EXTRACORPOREAL SHOCK WAVE LITHOTRIPSY Left 05/14/2017   Procedure: EXTRACORPOREAL SHOCK WAVE LITHOTRIPSY (ESWL);  Surgeon: Abbie Sons, MD;  Location: [facility] ORS;  Service: Urology;  Laterality: Left;   EXTRACORPOREAL SHOCK WAVE LITHOTRIPSY Left 06/14/2020   Procedure: EXTRACORPOREAL SHOCK WAVE LITHOTRIPSY (ESWL);  Surgeon: Billey Co, MD;  Location: [facility] ORS;  Service: Urology;  Laterality: Left;   FOOT SURGERY     RIGHT     KNEE ARTHROSCOPY W/ MENISCAL REPAIR Left    LASIK     LUMBAR LAMINECTOMY/DECOMPRESSION MICRODISCECTOMY Right 03/01/2021   Procedure: Microdiscectomy - Lumbar five-Sacral one - right;  Surgeon: Eustace Moore, MD;  Location: [place];  Service: Neurosurgery;  Laterality: Right;   MAXIMUM ACCESS (MAS)POSTERIOR LUMBAR INTERBODY FUSION (PLIF) 1 LEVEL N/A 04/25/2016   Procedure: LUMBAR FOUR-FIVE  MAXIMUM ACCESS (MAS) POSTERIOR LUMBAR INTERBODY FUSION (PLIF) with extension of instrumentation LUMBAR TWO-FIVE;  Surgeon: Eustace Moore, MD;  Location: [place];  Service: Neurosurgery;  Laterality: N/A;   MAXIMUM ACCESS (MAS)POSTERIOR LUMBAR INTERBODY FUSION (PLIF) 2 LEVEL N/A 12/13/2015   Procedure: Lumbar two-three - Lumbar three-four  MAXIMUM ACCESS (MAS) POSTERIOR LUMBAR INTERBODY FUSION (PLIF)  ;  Surgeon: Eustace Moore, MD;  Location: [facility] NEURO ORS;  Service: Neurosurgery;  Laterality: N/A;   REVERSE SHOULDER ARTHROPLASTY Left 03/01/2020   Procedure: REVERSE SHOULDER ARTHROPLASTY;  Surgeon: Corky Mull, MD;  Location: [facility] ORS;  Service: Orthopedics;  Laterality: Left;   REVERSE SHOULDER ARTHROPLASTY Right 07/03/2022   Procedure: REVERSE SHOULDER ARTHROPLASTY W/ BICEPS TENODESIS.;  Surgeon: Corky Mull, MD;  Location: [facility] ORS;  Service: Orthopedics;  Laterality: Right;   SHOULDER SURGERY     RIGHT    TONSILLECTOMY     Patient Active Problem List   Diagnosis Date Noted   S/P lumbar laminectomy 03/01/2021   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 edema associated with type 2 diabetes mellitus ([place]) 01/03/2019   Chronic cough 11/19/2016   Morbid obesity ([place]) 09/26/2016   Left ovarian cyst 05/30/2016   S/P lumbar spinal fusion 12/13/2015   Aortic calcification ([place]) 07/18/2015   Controlled type 2 diabetes mellitus without complication ([place]) Q000111Q   Thrombocytopenia ([place]) 05/16/2015   Recurrent major depressive disorder, in full remission ([place]) 05/16/2015   Essential (primary) hypertension 05/16/2015   Fatty infiltration of liver 03/15/2015   Aortic valve stenosis, nonrheumatic 01/18/2015   Sciatica of right side 01/09/2015   Type 2 diabetes mellitus ([place]) 11/10/2014   RLS (restless legs syndrome) 08/23/2014   Neuritis or radiculitis due to rupture of lumbar intervertebral disc 06/13/2014   Degeneration of intervertebral disc of lumbar region 06/13/2014   Lumbar radiculitis 06/13/2014   Arthritis 01/23/2014   Arthritis of knee, degenerative 11/15/2013   Depression 09/29/2013   Insomnia 09/29/2013   Apnea, sleep 08/29/2013   History of nephrolithiasis 08/29/2013   Abnormal presence of protein in urine 08/29/2013   Headache, migraine 08/29/2013   BP (high blood pressure) 08/29/2013   HLD (hyperlipidemia) 08/29/2013   Allergic rhinitis 08/29/2013   Intractable migraine without aura 02/18/2013    REFERRING DIAG: Reverse right total shoulder arthroplasty with biceps tenodesis.    THERAPY DIAG:  Right shoulder pain, unspecified chronicity  Stiffness of right shoulder, not elsewhere classified  Muscle weakness (generalized)  Rationale for Evaluation and Treatment Rehabilitation  PERTINENT HISTORY: S/P R reverse total shoulder replacement on 07/03/2022. Feels a little bit aggravated currently after taking a shower but usually goes away. Has had a couple of PT visits for home health. Exercises included pendulums and PROM.   PRECAUTIONS: PROM during initial weeks, no reaching behind back   SUBJECTIVE:    SUBJECTIVE STATEMENT: R shoulder is doing good. No pain currently.      PAIN:  Are you having pain? See subjective   TODAY'S TREATMENT:                                                                                                                                         DATE:  07/29/2022  Therapeutic exercises   Seated table slides R shoulder PROM (towel/pillow case slides)  Flexion 10x3  Scaption 10x. Biceps discomfort, quickly eased with rest  Abduction 10x R arm soreness  Supine PROM with PT Abduction 10x3   Scaption 10x3   Flexion 10x3  ER in scapular plane 10x   Seated  B scapular retraction 10x3 with 5 second holds    Cues to not utilize R shoulder muscles     Response to treatment Pt tolerated session well without aggravation of symptoms.    Clinical impression Continued working on R shoulder PROM to decrease stiffness. Cues to not use/activate R shoulder muscles.  Pt tolerated session well without aggravation of symptoms. Pt will benefit from continued skilled physical therapy services to improve ROM, strength, and function.        PATIENT EDUCATION: Education details: there-ex Person educated: Patient Education method: Explanation and Verbal cues Education comprehension: verbalized understanding and returned demonstration  HOME EXERCISE PROGRAM:    PT Short Term Goals - 07/22/22 1032       PT SHORT TERM GOAL #1   Title Patient will be independent with her initial HEP to improve ROM and R UE function.    Time 3    Period Weeks    Status New    Target Date 08/14/22              PT Long Term Goals - 07/22/22 1033       PT LONG TERM GOAL #1   Title Pt will have a leat 120 degrees R shoulder flexion and abduction A/AROM to promote ability to reach and use her R UE for functional tasks.    Baseline R shoulder PROM 95 degrees flexion, 72 degrees abduction (07/22/2022)    Time 12    Period Weeks    Status New    Target Date 10/16/22       PT LONG TERM GOAL #2   Title Pt will improve R shoulder ER A/AROM to at least 60 degrees to promote ability to reach behind her head.    Baseline R shoulder ER PROM in scapular plane: 33 degrees (07/22/2022)    Time 12    Period Weeks    Status New    Target Date 10/16/22      PT LONG TERM GOAL #3   Title Pt will have at least 4/5 R shoulder strength at all planes to promote ability to use her R UE for functional tasks.    Baseline R shoulder strength not tested yet to allow for more healing (07/22/2022)    Time 12    Period Weeks    Status New    Target Date 10/16/22      PT LONG TERM GOAL #4   Title Pt will improve R shoulder FOTO score to at least 50 as a demonstration of improved function.    Baseline R shoulder FOTO score 34 (07/22/2022)    Time 12    Period Weeks    Status New    Target Date 10/16/22              Plan - 07/29/22 1105     Clinical Impression Statement Continued working on R shoulder PROM to decrease stiffness. Cues to not use/activate R shoulder muscles.  Pt tolerated session well without aggravation of symptoms. Pt will benefit from continued skilled physical therapy services to improve ROM, strength, and function.    Personal Factors and Comorbidities Comorbidity 3+;Age;Past/Current Experience;Time  since onset of injury/illness/exacerbation;Fitness    Comorbidities Anxiety, depression, HTN, DM, arthritis    Examination-Activity Limitations Bathing;Carry;Lift;Bed Mobility;Toileting;Dressing;Reach Overhead;Caring for Others;Self Feeding;Hygiene/Grooming    Stability/Clinical Decision Making Stable/Uncomplicated    Rehab Potential Fair    PT Frequency 2x / week    PT Duration 12 weeks    PT Treatment/Interventions Therapeutic activities;Therapeutic exercise;Neuromuscular re-education;Patient/family education;Manual techniques;Aquatic Therapy;Electrical Stimulation    PT Next Visit Plan PROM, manual techniques, modalities PRN    Consulted and Agree  with Plan of Care Patient              Joneen Boers PT, DPT  07/29/2022, 3:42 PM

## 2022-07-30 ENCOUNTER — Ambulatory Visit: Payer: Medicare HMO

## 2022-07-31 ENCOUNTER — Ambulatory Visit: Payer: Medicare HMO

## 2022-08-04 ENCOUNTER — Ambulatory Visit: Payer: Medicare HMO | Attending: Student

## 2022-08-04 DIAGNOSIS — M25611 Stiffness of right shoulder, not elsewhere classified: Secondary | ICD-10-CM | POA: Diagnosis present

## 2022-08-04 DIAGNOSIS — M6281 Muscle weakness (generalized): Secondary | ICD-10-CM | POA: Insufficient documentation

## 2022-08-04 DIAGNOSIS — M25511 Pain in right shoulder: Secondary | ICD-10-CM | POA: Diagnosis present

## 2022-08-04 NOTE — Therapy (Signed)
OUTPATIENT PHYSICAL THERAPY TREATMENT NOTE   Patient Name: Jessica Cole MRN: [id number] DOB:30-Dec-1950, 72 y.o., female Today's Date: 08/04/2022  PCP: Adin Hector, MD  REFERRING PROVIDER: Lattie Corns, PA-C   END OF SESSION:  PT End of Session - 08/04/22 0931     Visit Number 4    Number of Visits 25    Date for PT Re-Evaluation 10/16/22    Progress Note Due on Visit 10    PT Start Time 0932    PT Stop Time 0958    PT Time Calculation (min) 26 min    Activity Tolerance Patient tolerated treatment well    Behavior During Therapy [facility] for tasks assessed/performed               Past Medical History:  Diagnosis Date   Anxiety    Asthma    seasonal with allergies   Colon polyps    Degenerative arthritis    Depression    Environmental allergies    Family history of adverse reaction to anesthesia    a.) PONV in 1st degree relative (sister)   Fatty liver    Glaucoma    History of bronchitis    History of kidney stones    History of pneumonia    Hypertension    Hypothyroidism    Migraines    Nose colonized with MRSA 02/14/2021   a.) preoperative PCR (+) on 02/14/2021 prior to LUMBAR MICRODISCECTOMY; b.) preoperative PCR (+) 06/24/2022 prior to [place] TENODESIS   Obesity    OSA on CPAP    Plantar fasciitis    right   Pneumonia    Renal calculi    Rheumatoid arthritis (HCC)    RLS (restless legs syndrome) 08/23/2014   T2DM (type 2 diabetes mellitus) ([place])    Past Surgical History:  Procedure Laterality Date   ABDOMINAL HYSTERECTOMY     partial   APPENDECTOMY     Arthroscopic surgery, knee Left    BREAST BIOPSY Right    several   BREAST BIOPSY Right 03/11/2017   u/s bx neg   BUNIONECTOMY     LEFT    COLONOSCOPY W/ POLYPECTOMY     COLONOSCOPY WITH PROPOFOL N/A 08/14/2017   Procedure: COLONOSCOPY WITH PROPOFOL;  Surgeon: Lollie Sails, MD;  Location: [facility] ENDOSCOPY;  Service: Endoscopy;  Laterality: N/A;    EXTRACORPOREAL SHOCK WAVE LITHOTRIPSY Left 05/14/2017   Procedure: EXTRACORPOREAL SHOCK WAVE LITHOTRIPSY (ESWL);  Surgeon: Abbie Sons, MD;  Location: [facility] ORS;  Service: Urology;  Laterality: Left;   EXTRACORPOREAL SHOCK WAVE LITHOTRIPSY Left 06/14/2020   Procedure: EXTRACORPOREAL SHOCK WAVE LITHOTRIPSY (ESWL);  Surgeon: Billey Co, MD;  Location: [facility] OR[facility];  Service: Urology;  Laterality: Left;   FOOT SURGERY     RIGHT     KNEE ARTHROSCOPY W/ MENISCAL REPAIR Left    LASIK     LUMBAR LAMINECTOMY/DECOMPRESSION MICRODISCECTOMY Right 03/01/2021   Procedure: Microdiscectomy - Lumbar five-Sacral one - right;  Surgeon: Eustace Moore, MD;  Location: [place];  Service: Neurosurgery;  Laterality: Right;   MAXIMUM ACCESS (MAS)POSTERIOR LUMBAR INTERBODY FUSION (PLIF) 1 LEVEL N/A 04/25/2016   Procedure: LUMBAR FOUR-FIVE  MAXIMUM ACCESS (MAS) POSTERIOR LUMBAR INTERBODY FUSION (PLIF) with extension of instrumentation LUMBAR TWO-FIVE;  Surgeon: Eustace Moore, MD;  Location: [place];  Service: Neurosurgery;  Laterality: N/A;   MAXIMUM ACCESS (MAS)POSTERIOR LUMBAR INTERBODY FUSION (PLIF) 2 LEVEL N/A 12/13/2015   Procedure: Lumbar two-three - Lumbar  three-four MAXIMUM ACCESS (MAS) POSTERIOR LUMBAR INTERBODY FUSION (PLIF)  ;  Surgeon: Eustace Moore, MD;  Location: [facility] NEURO ORS;  Service: Neurosurgery;  Laterality: N/A;   REVERSE SHOULDER ARTHROPLASTY Left 03/01/2020   Procedure: REVERSE SHOULDER ARTHROPLASTY;  Surgeon: Corky Mull, MD;  Location: [facility] ORS;  Service: Orthopedics;  Laterality: Left;   REVERSE SHOULDER ARTHROPLASTY Right 07/03/2022   Procedure: REVERSE SHOULDER ARTHROPLASTY W/ BICEPS TENODESIS.;  Surgeon: Corky Mull, MD;  Location: [facility];  Service: Orthopedics;  Laterality: Right;   SHOULDER SURGERY     RIGHT    TONSILLECTOMY     Patient Active Problem List   Diagnosis Date Noted   S/P lumbar laminectomy 03/01/2021   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 edema associated with type 2 diabetes mellitus 01/03/2019   Chronic cough 11/19/2016   Morbid obesity 09/26/2016   Left ovarian cyst 05/30/2016   S/P lumbar spinal fusion 12/13/2015   Aortic calcification 07/18/2015   Controlled type 2 diabetes mellitus without complication Q000111Q   Thrombocytopenia 05/16/2015   Recurrent major depressive disorder, in full remission 05/16/2015   Essential (primary) hypertension 05/16/2015   Fatty infiltration of liver 03/15/2015   Aortic valve stenosis, nonrheumatic 01/18/2015   Sciatica of right side 01/09/2015   Type 2 diabetes mellitus 11/10/2014   RLS (restless legs syndrome) 08/23/2014   Neuritis or radiculitis due to rupture of lumbar intervertebral disc 06/13/2014   Degeneration of intervertebral disc of lumbar region 06/13/2014   Lumbar radiculitis 06/13/2014   Arthritis 01/23/2014   Arthritis of knee, degenerative 11/15/2013   Depression 09/29/2013   Insomnia 09/29/2013   Apnea, sleep 08/29/2013   History of nephrolithiasis 08/29/2013   Abnormal presence of protein in urine 08/29/2013   Headache, migraine 08/29/2013   BP (high blood pressure) 08/29/2013   HLD (hyperlipidemia) 08/29/2013   Allergic rhinitis 08/29/2013   Intractable migraine without aura 02/18/2013    REFERRING DIAG: Reverse right total shoulder arthroplasty with biceps tenodesis.    THERAPY DIAG:  Right shoulder pain, unspecified chronicity  Stiffness of right shoulder, not elsewhere classified  Muscle weakness (generalized)  Rationale for Evaluation and Treatment Rehabilitation  PERTINENT HISTORY: S/P R reverse total shoulder replacement on 07/03/2022. Feels a little bit aggravated currently after taking a shower but usually goes away. Has had a couple of PT visits for home health. Exercises included pendulums and PROM.   PRECAUTIONS: PROM during initial weeks, no reaching behind back   SUBJECTIVE:   SUBJECTIVE STATEMENT: R shoulder is doing good.  No pain right now.      PAIN:  Are you having pain? See subjective   TODAY'S TREATMENT:                                                                                                                                         DATE: 08/04/2022  Therapeutic exercises  Supine R shoulder PROM with L UE assist   Flexion 10x3   Then with dowel   Scaption 10x3 with PT assist  Supine R shoulder PROM with PT   ER in scapular plane, not past 30 degrees 10x3  Supine R elbow PROM flex/ext 10x3   Seated  B scapular retraction 10x3 with 5 second holds  Seated chin tucks to promote upper thoracic extension and scapular retraction 10x5 seconds       Cues to not utilize R shoulder muscles     Response to treatment Pt tolerated session well without aggravation of symptoms.    Clinical impression Continued working on R shoulder PROM to decrease stiffness. Cues to not use/activate R shoulder muscles. Continued working on scapular muscle activation to promote proper mechanics when able to actively raise her arm.   Pt tolerated session well without aggravation of symptoms. Pt will benefit from continued skilled physical therapy services to improve ROM, strength, and function.        PATIENT EDUCATION: Education details: there-ex Person educated: Patient Education method: Explanation and Verbal cues Education comprehension: verbalized understanding and returned demonstration  HOME EXERCISE PROGRAM: Access Code: [email or web address] URL: [email or web address] Date: 08/04/2022 Prepared by: Joneen Boers  Exercises - Supine Shoulder Flexion AAROM with Hands Clasped  - 1 x daily - 7 x weekly - 3 sets - 10 reps   PT Short Term Goals - 07/22/22 1032       PT SHORT TERM GOAL #1   Title Patient will be independent with her initial HEP to improve ROM and R UE function.    Time 3    Period Weeks    Status New    Target Date 08/14/22              PT Long Term Goals -  07/22/22 1033       PT LONG TERM GOAL #1   Title Pt will have a leat 120 degrees R shoulder flexion and abduction A/AROM to promote ability to reach and use her R UE for functional tasks.    Baseline R shoulder PROM 95 degrees flexion, 72 degrees abduction (07/22/2022)    Time 12    Period Weeks    Status New    Target Date 10/16/22      PT LONG TERM GOAL #2   Title Pt will improve R shoulder ER A/AROM to at least 60 degrees to promote ability to reach behind her head.    Baseline R shoulder ER PROM in scapular plane: 33 degrees (07/22/2022)    Time 12    Period Weeks    Status New    Target Date 10/16/22      PT LONG TERM GOAL #3   Title Pt will have at least 4/5 R shoulder strength at all planes to promote ability to use her R UE for functional tasks.    Baseline R shoulder strength not tested yet to allow for more healing (07/22/2022)    Time 12    Period Weeks    Status New    Target Date 10/16/22      PT LONG TERM GOAL #4   Title Pt will improve R shoulder FOTO score to at least 50 as a demonstration of improved function.    Baseline R shoulder FOTO score 34 (07/22/2022)    Time 12    Period Weeks    Status New    Target Date 10/16/22  Plan - 08/04/22 0930     Clinical Impression Statement Continued working on R shoulder PROM to decrease stiffness. Cues to not use/activate R shoulder muscles. Continued working on scapular muscle activation to promote proper mechanics when able to actively raise her arm.   Pt tolerated session well without aggravation of symptoms. Pt will benefit from continued skilled physical therapy services to improve ROM, strength, and function.    Personal Factors and Comorbidities Comorbidity 3+;Age;Past/Current Experience;Time since onset of injury/illness/exacerbation;Fitness    Comorbidities Anxiety, depression, HTN, DM, arthritis    Examination-Activity Limitations Bathing;Carry;Lift;Bed Mobility;Toileting;Dressing;Reach  Overhead;Caring for Others;Self Feeding;Hygiene/Grooming    Stability/Clinical Decision Making Stable/Uncomplicated    Rehab Potential Fair    PT Frequency 2x / week    PT Duration 12 weeks    PT Treatment/Interventions Therapeutic activities;Therapeutic exercise;Neuromuscular re-education;Patient/family education;Manual techniques;Aquatic Therapy;Electrical Stimulation    PT Next Visit Plan PROM, manual techniques, modalities PRN    Consulted and Agree with Plan of Care Patient              Joneen Boers PT, DPT  08/04/2022, 10:08 AM

## 2022-08-07 ENCOUNTER — Ambulatory Visit: Payer: Medicare HMO

## 2022-08-07 DIAGNOSIS — M6281 Muscle weakness (generalized): Secondary | ICD-10-CM

## 2022-08-07 DIAGNOSIS — M25611 Stiffness of right shoulder, not elsewhere classified: Secondary | ICD-10-CM

## 2022-08-07 DIAGNOSIS — M25511 Pain in right shoulder: Secondary | ICD-10-CM

## 2022-08-07 NOTE — Therapy (Signed)
OUTPATIENT PHYSICAL THERAPY TREATMENT NOTE   Patient Name: Jessica Cole MRN: [id number] DOB:Apr 09, 1951, 72 y.o., female Today's Date: 08/07/2022  PCP: Adin Hector, MD  REFERRING PROVIDER: Lattie Corns, PA-C   END OF SESSION:  PT End of Session - 08/07/22 0939     Visit Number 5    Number of Visits 25    Date for PT Re-Evaluation 10/16/22    Progress Note Due on Visit 10    PT Start Time 0940   pt arrived late   PT Stop Time 1027    PT Time Calculation (min) 47 min    Activity Tolerance Patient tolerated treatment well    Behavior During Therapy WFL for tasks assessed/performed                Past Medical History:  Diagnosis Date   Anxiety    Asthma    seasonal with allergies   Colon polyps    Degenerative arthritis    Depression    Environmental allergies    Family history of adverse reaction to anesthesia    a.) PONV in 1st degree relative (sister)   Fatty liver    Glaucoma    History of bronchitis    History of kidney stones    History of pneumonia    Hypertension    Hypothyroidism    Migraines    Nose colonized with MRSA 02/14/2021   a.) preoperative PCR (+) on 02/14/2021 prior to LUMBAR MICRODISCECTOMY; b.) preoperative PCR (+) 06/24/2022 prior to [place] TENODESIS   Obesity    OSA on CPAP    Plantar fasciitis    right   Pneumonia    Renal calculi    Rheumatoid arthritis (HCC)    RLS (restless legs syndrome) 08/23/2014   T2DM (type 2 diabetes mellitus) ([place])    Past Surgical History:  Procedure Laterality Date   ABDOMINAL HYSTERECTOMY     partial   APPENDECTOMY     Arthroscopic surgery, knee Left    BREAST BIOPSY Right    several   BREAST BIOPSY Right 03/11/2017   u/s bx neg   BUNIONECTOMY     LEFT    COLONOSCOPY W/ POLYPECTOMY     COLONOSCOPY WITH PROPOFOL N/A 08/14/2017   Procedure: COLONOSCOPY WITH PROPOFOL;  Surgeon: Lollie Sails, MD;  Location[facility] ENDOSCOPY;  Service: Endoscopy;   Laterality: N/A;   EXTRACORPOREAL SHOCK WAVE LITHOTRIPSY Left 05/14/2017   Procedure: EXTRACORPOREAL SHOCK WAVE LITHOTRIPSY (ESWL);  Surgeon: Abbie Sons, MD;  Location: [facility] ORS;  Service: Urology;  Laterality: Left;   EXTRACORPOREAL SHOCK WAVE LITHOTRIPSY Left 06/14/2020   Procedure: EXTRACORPOREAL SHOCK WAVE LITHOTRIPSY (ESWL);  Surgeon: Billey Co, MD;  Location: [facility] ORS;  Service: Urology;  Laterality: Left;   FOOT SURGERY     RIGHT     KNEE ARTHROSCOPY W/ MENISCAL REPAIR Left    LASIK     LUMBAR LAMINECTOMY/DECOMPRESSION MICRODISCECTOMY Right 03/01/2021   Procedure: Microdiscectomy - Lumbar five-Sacral one - right;  Surgeon: Eustace Moore, MD;  Location: [place];  Service: Neurosurgery;  Laterality: Right;   MAXIMUM ACCESS (MAS)POSTERIOR LUMBAR INTERBODY FUSION (PLIF) 1 LEVEL N/A 04/25/2016   Procedure: LUMBAR FOUR-FIVE  MAXIMUM ACCESS (MAS) POSTERIOR LUMBAR INTERBODY FUSION (PLIF) with extension of instrumentation LUMBAR TWO-FIVE;  Surgeon: Eustace Moore, MD;  Location: [place];  Service: Neurosurgery;  Laterality: N/A;   MAXIMUM ACCESS (MAS)POSTERIOR LUMBAR INTERBODY FUSION (PLIF) 2 LEVEL N/A 12/13/2015  Procedure: Lumbar two-three - Lumbar three-four MAXIMUM ACCESS (MAS) POSTERIOR LUMBAR INTERBODY FUSION (PLIF)  ;  Surgeon: Eustace Moore, MD;  Location: [facility] NEURO ORS;  Service: Neurosurgery;  Laterality: N/A;   REVERSE SHOULDER ARTHROPLASTY Left 03/01/2020   Procedure: REVERSE SHOULDER ARTHROPLASTY;  Surgeon: Corky Mull, MD;  Location: [facility] ORS;  Service: Orthopedics;  Laterality: Left;   REVERSE SHOULDER ARTHROPLASTY Right 07/03/2022   Procedure: REVERSE SHOULDER ARTHROPLASTY W/ BICEPS TENODESIS.;  Surgeon: Corky Mull, MD;  Location: [facility] ORS;  Service: Orthopedics;  Laterality: Right;   SHOULDER SURGERY     RIGHT    TONSILLECTOMY     Patient Active Problem List   Diagnosis Date Noted   S/P lumbar laminectomy 03/01/2021   Mild nonproliferative diabetic retinopathy of  both eyes without macular edema associated with type 2 diabetes mellitus 01/03/2019   Chronic cough 11/19/2016   Morbid obesity 09/26/2016   Left ovarian cyst 05/30/2016   S/P lumbar spinal fusion 12/13/2015   Aortic calcification 07/18/2015   Controlled type 2 diabetes mellitus without complication [id number]   Thrombocytopenia 05/16/2015   Recurrent major depressive disorder, in full remission 05/16/2015   Essential (primary) hypertension 05/16/2015   Fatty infiltration of liver 03/15/2015   Aortic valve stenosis, nonrheumatic 01/18/2015   Sciatica of right side 01/09/2015   Type 2 diabetes mellitus 11/10/2014   RLS (restless legs syndrome) 08/23/2014   Neuritis or radiculitis due to rupture of lumbar intervertebral disc 06/13/2014   Degeneration of intervertebral disc of lumbar region 06/13/2014   Lumbar radiculitis 06/13/2014   Arthritis 01/23/2014   Arthritis of knee, degenerative 11/15/2013   Depression 09/29/2013   Insomnia 09/29/2013   Apnea, sleep 08/29/2013   History of nephrolithiasis 08/29/2013   Abnormal presence of protein in urine 08/29/2013   Headache, migraine 08/29/2013   BP (high blood pressure) 08/29/2013   HLD (hyperlipidemia) 08/29/2013   Allergic rhinitis 08/29/2013   Intractable migraine without aura 02/18/2013    REFERRING DIAG: Reverse right total shoulder arthroplasty with biceps tenodesis.    THERAPY DIAG:  Right shoulder pain, unspecified chronicity  Stiffness of right shoulder, not elsewhere classified  Muscle weakness (generalized)  Rationale for Evaluation and Treatment Rehabilitation  PERTINENT HISTORY: S/P R reverse total shoulder replacement on 07/03/2022. Feels a little bit aggravated currently after taking a shower but usually goes away. Has had a couple of PT visits for home health. Exercises included pendulums and PROM.   PRECAUTIONS: PROM during initial weeks, no reaching behind back   SUBJECTIVE:   SUBJECTIVE STATEMENT: R  shoulder is doing good. Maybe a tinsy bit of R shoulder pain. Sciatic nerve is bothering her. On L side to L foot (sciatic nerve to common peroneal nerve distribution). Next week, pt is going out of town to a beach for 2 days.  Pt states not doing her HEP.      PAIN:  Are you having pain? See subjective   TODAY'S TREATMENT:  DATE: 08/07/2022  Therapeutic exercises  Currently following the [place] Reverse Total Shoulder Replacement Protocol  Pt currently 5 weeks post op  Seated B scapular retraction 10x5 seconds for 3 sets  Increased L lateral thigh symptoms with addition of glute max, trunk muscle activation and hip adduction isometrics, therefore stopped.   Supine R shoulder PROM/AAROM with PT   Flexion 10x3  Scaption 10x3  Abduction 10x3  Supine R shoulder PROM with L UE assist   Flexion 10x3  Cues to perform properly.   Increased time secondary to trying to find ways to help decrease low back and LE symptoms while performing exercises to improve comfort for R shoulder rehab.   Improved exercise technique, movement at target joints, use of target muscles after mod verbal, visual, tactile cues.       Response to treatment Pt tolerated session well without aggravation of shoulder symptoms. Improved tolerance to low back and L LE symptoms with decreasing lumbar extension stress.     Clinical impression Continued working on R shoulder PROM/AAROM to decrease stiffness.  Continued working on scapular muscle activation to promote proper mechanics when able to actively raise her arm. Questionable compliance of precautions and home exercise performance at home.  Pt tolerated session well without aggravation of shoulder symptoms. Challenges to progress include low back pain with LE symptoms. Pt will benefit from continued skilled physical  therapy services to improve ROM, strength, and function.        PATIENT EDUCATION: Education details: there-ex Person educated: Patient Education method: Explanation and Verbal cues Education comprehension: verbalized understanding and returned demonstration  HOME EXERCISE PROGRAM: Access Code: [email or web address] URL: [email or web address] Date: 08/04/2022 Prepared by: Joneen Boers  Exercises - Supine Shoulder Flexion AAROM with Hands Clasped  - 1 x daily - 7 x weekly - 3 sets - 10 reps   PT Short Term Goals - 07/22/22 1032       PT SHORT TERM GOAL #1   Title Patient will be independent with her initial HEP to improve ROM and R UE function.    Time 3    Period Weeks    Status New    Target Date 08/14/22              PT Long Term Goals - 07/22/22 1033       PT LONG TERM GOAL #1   Title Pt will have a leat 120 degrees R shoulder flexion and abduction A/AROM to promote ability to reach and use her R UE for functional tasks.    Baseline R shoulder PROM 95 degrees flexion, 72 degrees abduction (07/22/2022)    Time 12    Period Weeks    Status New    Target Date 10/16/22      PT LONG TERM GOAL #2   Title Pt will improve R shoulder ER A/AROM to at least 60 degrees to promote ability to reach behind her head.    Baseline R shoulder ER PROM in scapular plane: 33 degrees (07/22/2022)    Time 12    Period Weeks    Status New    Target Date 10/16/22      PT LONG TERM GOAL #3   Title Pt will have at least 4/5 R shoulder strength at all planes to promote ability to use her R UE for functional tasks.    Baseline R shoulder strength not tested yet to allow for more healing (07/22/2022)    Time 12    Period Weeks  Status New    Target Date 10/16/22      PT LONG TERM GOAL #4   Title Pt will improve R shoulder FOTO score to at least 50 as a demonstration of improved function.    Baseline R shoulder FOTO score 34 (07/22/2022)    Time 12    Period Weeks    Status  New    Target Date 10/16/22              Plan - 08/07/22 0938     Clinical Impression Statement Continued working on R shoulder PROM/AAROM to decrease stiffness.  Continued working on scapular muscle activation to promote proper mechanics when able to actively raise her arm. Questionable compliance of precautions and home exercise performance at home.  Pt tolerated session well without aggravation of shoulder symptoms. Challenges to progress include low back pain with LE symptoms. Pt will benefit from continued skilled physical therapy services to improve ROM, strength, and function.    Personal Factors and Comorbidities Comorbidity 3+;Age;Past/Current Experience;Time since onset of injury/illness/exacerbation;Fitness    Comorbidities Anxiety, depression, HTN, DM, arthritis    Examination-Activity Limitations Bathing;Carry;Lift;Bed Mobility;Toileting;Dressing;Reach Overhead;Caring for Others;Self Feeding;Hygiene/Grooming    Stability/Clinical Decision Making Stable/Uncomplicated    Rehab Potential Fair    PT Frequency 2x / week    PT Duration 12 weeks    PT Treatment/Interventions Therapeutic activities;Therapeutic exercise;Neuromuscular re-education;Patient/family education;Manual techniques;Aquatic Therapy;Electrical Stimulation    PT Next Visit Plan PROM, manual techniques, modalities PRN    Consulted and Agree with Plan of Care Patient              Joneen Boers PT, DPT  08/07/2022, 10:43 AM

## 2022-08-11 ENCOUNTER — Ambulatory Visit: Payer: Medicare HMO

## 2022-08-11 DIAGNOSIS — M25511 Pain in right shoulder: Secondary | ICD-10-CM | POA: Diagnosis not present

## 2022-08-11 DIAGNOSIS — M6281 Muscle weakness (generalized): Secondary | ICD-10-CM

## 2022-08-11 DIAGNOSIS — M25611 Stiffness of right shoulder, not elsewhere classified: Secondary | ICD-10-CM

## 2022-08-11 NOTE — Therapy (Signed)
OUTPATIENT PHYSICAL THERAPY TREATMENT NOTE   Patient Name: Jessica Cole MRN: [id number] DOB:26-Mar-1951, 72 y.o., female Today's Date: 08/11/2022  PCP: Lynnea FerrierKlein, Bert J III, MD  REFERRING PROVIDER: Anson OregonMcGhee, James Lance, PA-C   END OF SESSION:  PT End of Session - 08/11/22 0851     Visit Number 6    Number of Visits 25    Date for PT Re-Evaluation 10/16/22    Progress Note Due on Visit 10    PT Start Time 0851    PT Stop Time 0924    PT Time Calculation (min) 33 min    Activity Tolerance Patient tolerated treatment well    Behavior [facility] for tasks assessed/performed                 Past Medical History:  Diagnosis Date   Anxiety    Asthma    seasonal with allergies   Colon polyps    Degenerative arthritis    Depression    Environmental allergies    Family history of adverse reaction to anesthesia    a.) PONV in 1st degree relative (sister)   Fatty liver    Glaucoma    History of bronchitis    History of kidney stones    History of pneumonia    Hypertension    Hypothyroidism    Migraines    Nose colonized with MRSA 02/14/2021   a.) preoperative PCR (+) on 02/14/2021 prior to LUMBAR MICRODISCECTOMY; b.) preoperative PCR (+) 06/24/2022 prior to REVERSE SHOULDER ARTHROPLASTY W/ BICEPS TENODESIS   Obesity    OSA on CPAP    Plantar fasciitis    right   Pneumonia    Renal calculi    Rheumatoid arthritis (HCC)    RLS (restless legs syndrome) 08/23/2014   T2DM (type 2 diabetes mellitus) (HCC)    Past Surgical History:  Procedure Laterality Date   ABDOMINAL HYSTERECTOMY     partial   APPENDECTOMY     Arthroscopic surgery, knee Left    BREAST BIOPSY Right    several   BREAST BIOPSY Right 03/11/2017   u/s bx neg   BUNIONECTOMY     LEFT    COLONOSCOPY W/ POLYPECTOMY     COLONOSCOPY WITH PROPOFOL N/A 08/14/2017   Procedure: COLONOSCOPY WITH PROPOFOL;  Surgeon: Christena DeemSkulskie, Martin U, MD;  [facility];  Service: Endoscopy;  Laterality:  N/A;   EXTRACORPOREAL SHOCK WAVE LITHOTRIPSY Left 05/14/2017   Procedure: EXTRACORPOREAL SHOCK WAVE LITHOTRIPSY (ESWL);  Surgeon: Riki AltesStoioff, Scott C, MD;  Location: [facility] ORS;  Service: Urology;  Laterality: Left;   EXTRACORPOREAL SHOCK WAVE LITHOTRIPSY Left 06/14/2020   Procedure: EXTRACORPOREAL SHOCK WAVE LITHOTRIPSY (ESWL);  Surgeon: Sondra ComeSninsky, Brian C, MD;  Location: [facility] ORS;  Service: Urology;  Laterality: Left;   FOOT SURGERY     RIGHT     KNEE ARTHROSCOPY W/ MENISCAL REPAIR Left    LASIK     LUMBAR LAMINECTOMY/DECOMPRESSION MICRODISCECTOMY Right 03/01/2021   Procedure: Microdiscectomy - Lumbar five-Sacral one - right;  Surgeon: Tia AlertJones, David S, MD;  [facility];  Service: Neurosurgery;  Laterality: Right;   MAXIMUM ACCESS (MAS)POSTERIOR LUMBAR INTERBODY FUSION (PLIF) 1 LEVEL N/A 04/25/2016   Procedure: LUMBAR FOUR-FIVE  MAXIMUM ACCESS (MAS) POSTERIOR LUMBAR INTERBODY FUSION (PLIF) with extension of instrumentation LUMBAR TWO-FIVE;  Surgeon: Tia Alertavid S Jones, MD;  [facility];  Service: Neurosurgery;  Laterality: N/A;   MAXIMUM ACCESS (MAS)POSTERIOR LUMBAR INTERBODY FUSION (PLIF) 2 LEVEL N/A 12/13/2015   Procedure: Lumbar two-three -  Lumbar three-four MAXIMUM ACCESS (MAS) POSTERIOR LUMBAR INTERBODY FUSION (PLIF)  ;  Surgeon: Tia Alert, MD;  Location[facility] NEURO ORS;  Service: Neurosurgery;  Laterality: N/A;   REVERSE SHOULDER ARTHROPLASTY Left 03/01/2020   Procedure: REVERSE SHOULDER ARTHROPLASTY;  Surgeon: Christena Flake, MD;  Location: [facility] ORS;  Service: Orthopedics;  Laterality: Left;   REVERSE SHOULDER ARTHROPLASTY Right 07/03/2022   Procedure: REVERSE SHOULDER ARTHROPLASTY W/ BICEPS TENODESIS.;  Surgeon: Christena Flake, MD;  Location: [facility] ORS;  Service: Orthopedics;  Laterality: Right;   SHOULDER SURGERY     RIGHT    TONSILLECTOMY     Patient Active Problem List   Diagnosis Date Noted   S/P lumbar laminectomy 03/01/2021   Mild nonproliferative diabetic retinopathy of both eyes  without macular edema associated with type 2 diabetes mellitus 01/03/2019   Chronic cough 11/19/2016   Morbid obesity 09/26/2016   Left ovarian cyst 05/30/2016   S/P lumbar spinal fusion 12/13/2015   Aortic calcification 07/18/2015   Controlled type 2 diabetes mellitus without complication 05/16/2015   Thrombocytopenia 05/16/2015   Recurrent major depressive disorder, in full remission 05/16/2015   Essential (primary) hypertension 05/16/2015   Fatty infiltration of liver 03/15/2015   Aortic valve stenosis, nonrheumatic 01/18/2015   Sciatica of right side 01/09/2015   Type 2 diabetes mellitus 11/10/2014   RLS (restless legs syndrome) 08/23/2014   Neuritis or radiculitis due to rupture of lumbar intervertebral disc 06/13/2014   Degeneration of intervertebral disc of lumbar region 06/13/2014   Lumbar radiculitis 06/13/2014   Arthritis 01/23/2014   Arthritis of knee, degenerative 11/15/2013   Depression 09/29/2013   Insomnia 09/29/2013   Apnea, sleep 08/29/2013   History of nephrolithiasis 08/29/2013   Abnormal presence of protein in urine 08/29/2013   Headache, migraine 08/29/2013   BP (high blood pressure) 08/29/2013   HLD (hyperlipidemia) 08/29/2013   Allergic rhinitis 08/29/2013   Intractable migraine without aura 02/18/2013    REFERRING DIAG: Reverse right total shoulder arthroplasty with biceps tenodesis.    THERAPY DIAG:  Right shoulder pain, unspecified chronicity  Stiffness of right shoulder, not elsewhere classified  Muscle weakness (generalized)  Rationale for Evaluation and Treatment Rehabilitation  PERTINENT HISTORY: S/P R reverse total shoulder replacement on 07/03/2022. Feels a little bit aggravated currently after taking a shower but usually goes away. Has had a couple of PT visits for home health. Exercises included pendulums and PROM.   PRECAUTIONS: PROM during initial weeks, no reaching behind back   SUBJECTIVE:   SUBJECTIVE STATEMENT: R shoulder is  doing good.  No R shoulder pain currently. Got a brace for her back which helped. Did not do her R shoulder HEP.     PAIN:  Are you having pain? See subjective   TODAY'S TREATMENT:  DATE: 08/11/2022  Therapeutic exercises  Currently following the [facility] Reverse Total Shoulder Replacement Protocol  Supine R shoulder PROM/AAROM with PT   Flexion 10x3  Scaption 10x3  Abduction 10x3  Supine R shoulder PROM/AAROM with L UE assist   Flexion 10x3  Cues to perform properly.    Hooklying transversus abdominis contraction 10x3 with 5 second hold   Seated B scapular retraction 10x5 seconds for 2 sets     Improved exercise technique, movement at target joints, use of target muscles after mod verbal, visual, tactile cues.       Response to treatment Pt tolerated session well without aggravation of shoulder symptoms.   Clinical impression Continued working on R shoulder PROM/AAROM to decrease stiffness.  Continued working on scapular muscle activation to promote proper mechanics when able to actively raise her arm.  Questionable compliance of precautions and home exercise performance at home Pt tolerated session well without aggravation of shoulder symptoms. Challenges to progress include low back pain with LE symptoms. Pt will benefit from continued skilled physical therapy services to improve ROM, strength, and function.        PATIENT EDUCATION: Education details: there-ex Person educated: Patient Education method: Explanation and Verbal cues Education comprehension: verbalized understanding and returned demonstration  HOME EXERCISE PROGRAM: Access Code: [email or web address] URL: [email or web address] Date: 08/04/2022 Prepared by: Loralyn Freshwater  Exercises - Supine Shoulder Flexion AAROM with Hands Clasped  - 1 x daily -  7 x weekly - 3 sets - 10 reps   PT Short Term Goals - 07/22/22 1032       PT SHORT TERM GOAL #1   Title Patient will be independent with her initial HEP to improve ROM and R UE function.    Time 3    Period Weeks    Status New    Target Date 08/14/22              PT Long Term Goals - 07/22/22 1033       PT LONG TERM GOAL #1   Title Pt will have a leat 120 degrees R shoulder flexion and abduction A/AROM to promote ability to reach and use her R UE for functional tasks.    Baseline R shoulder PROM 95 degrees flexion, 72 degrees abduction (07/22/2022)    Time 12    Period Weeks    Status New    Target Date 10/16/22      PT LONG TERM GOAL #2   Title Pt will improve R shoulder ER A/AROM to at least 60 degrees to promote ability to reach behind her head.    Baseline R shoulder ER PROM in scapular plane: 33 degrees (07/22/2022)    Time 12    Period Weeks    Status New    Target Date 10/16/22      PT LONG TERM GOAL #3   Title Pt will have at least 4/5 R shoulder strength at all planes to promote ability to use her R UE for functional tasks.    Baseline R shoulder strength not tested yet to allow for more healing (07/22/2022)    Time 12    Period Weeks    Status New    Target Date 10/16/22      PT LONG TERM GOAL #4   Title Pt will improve R shoulder FOTO score to at least 50 as a demonstration of improved function.    Baseline R shoulder FOTO score 34 (07/22/2022)    Time 12  Period Weeks    Status New    Target Date 10/16/22              Plan - 08/11/22 0850     Clinical Impression Statement Continued working on R shoulder PROM/AAROM to decrease stiffness.  Continued working on scapular muscle activation to promote proper mechanics when able to actively raise her arm.  Questionable compliance of precautions and home exercise performance at home Pt tolerated session well without aggravation of shoulder symptoms. Challenges to progress include low back pain with LE  symptoms. Pt will benefit from continued skilled physical therapy services to improve ROM, strength, and function.    Personal Factors and Comorbidities Comorbidity 3+;Age;Past/Current Experience;Time since onset of injury/illness/exacerbation;Fitness    Comorbidities Anxiety, depression, HTN, DM, arthritis    Examination-Activity Limitations Bathing;Carry;Lift;Bed Mobility;Toileting;Dressing;Reach Overhead;Caring for Others;Self Feeding;Hygiene/Grooming    Stability/Clinical Decision Making Stable/Uncomplicated    Rehab Potential Fair    PT Frequency 2x / week    PT Duration 12 weeks    PT Treatment/Interventions Therapeutic activities;Therapeutic exercise;Neuromuscular re-education;Patient/family education;Manual techniques;Aquatic Therapy;Electrical Stimulation    PT Next Visit Plan PROM, manual techniques, modalities PRN    Consulted and Agree with Plan of Care Patient              Loralyn Freshwater PT, DPT  08/11/2022, 12:46 PM

## 2022-08-13 ENCOUNTER — Ambulatory Visit: Payer: Medicare HMO

## 2022-08-13 DIAGNOSIS — M6281 Muscle weakness (generalized): Secondary | ICD-10-CM

## 2022-08-13 DIAGNOSIS — M25511 Pain in right shoulder: Secondary | ICD-10-CM

## 2022-08-13 DIAGNOSIS — M25611 Stiffness of right shoulder, not elsewhere classified: Secondary | ICD-10-CM

## 2022-08-13 NOTE — Therapy (Signed)
OUTPATIENT PHYSICAL THERAPY TREATMENT NOTE   Patient Name: Jessica PutnamGail T Urbani MRN: [id number] DOB:Sep 26, 1950, 72 y.o., female Today's Date: 08/13/2022  PCP: Lynnea FerrierKlein, Bert J III, MD  REFERRING PROVIDER: Anson OregonMcGhee, James Lance, PA-C   END OF SESSION:  PT End of Session - 08/13/22 1550     Visit Number 7    Number of Visits 25    Date for PT Re-Evaluation 10/16/22    Progress Note Due on Visit 10    PT Start Time 1550    PT Stop Time 1623    PT Time Calculation (min) 33 min    Activity Tolerance Patient tolerated treatment well    Behavior During Therapy WFL for tasks assessed/performed                  Past Medical History:  Diagnosis Date   Anxiety    Asthma    seasonal with allergies   Colon polyps    Degenerative arthritis    Depression    Environmental allergies    Family history of adverse reaction to anesthesia    a.) PONV in 1st degree relative (sister)   Fatty liver    Glaucoma    History of bronchitis    History of kidney stones    History of pneumonia    Hypertension    Hypothyroidism    Migraines    Nose colonized with MRSA 02/14/2021   a.) preoperative PCR (+) on 02/14/2021 prior to LUMBAR MICRODISCECTOMY; b.) preoperative PCR (+) 06/24/2022 prior to REVERSE SHOULDER ARTHROPLASTY W/ BICEPS TENODESIS   Obesity    OSA on CPAP    Plantar fasciitis    right   Pneumonia    Renal calculi    Rheumatoid arthritis (HCC)    RLS (restless legs syndrome) 08/23/2014   T2DM (type 2 diabetes mellitus) (HCC)    Past Surgical History:  Procedure Laterality Date   ABDOMINAL HYSTERECTOMY     partial   APPENDECTOMY     Arthroscopic surgery, knee Left    BREAST BIOPSY Right    several   BREAST BIOPSY Right 03/11/2017   u/s bx neg   BUNIONECTOMY     LEFT    COLONOSCOPY W/ POLYPECTOMY     COLONOSCOPY WITH PROPOFOL N/A 08/14/2017   Procedure: COLONOSCOPY WITH PROPOFOL;  Surgeon: Christena DeemSkulskie, Martin U, MD;  [facility];  Service: Endoscopy;   Laterality: N/A;   EXTRACORPOREAL SHOCK WAVE LITHOTRIPSY Left 05/14/2017   Procedure: EXTRACORPOREAL SHOCK WAVE LITHOTRIPSY (ESWL);  Surgeon: Riki AltesStoioff, Scott C, MD;  Location: [facility] ORS;  Service: Urology;  Laterality: Left;   EXTRACORPOREAL SHOCK WAVE LITHOTRIPSY Left 06/14/2020   Procedure: EXTRACORPOREAL SHOCK WAVE LITHOTRIPSY (ESWL);  Surgeon: Sondra ComeSninsky, Brian C, MD;  Location: [facility] ORS;  Service: Urology;  Laterality: Left;   FOOT SURGERY     RIGHT     KNEE ARTHROSCOPY W/ MENISCAL REPAIR Left    LASIK     LUMBAR LAMINECTOMY/DECOMPRESSION MICRODISCECTOMY Right 03/01/2021   Procedure: Microdiscectomy - Lumbar five-Sacral one - right;  Surgeon: Tia AlertJones, David S, MD;  [facility];  Service: Neurosurgery;  Laterality: Right;   MAXIMUM ACCESS (MAS)POSTERIOR LUMBAR INTERBODY FUSION (PLIF) 1 LEVEL N/A 04/25/2016   Procedure: LUMBAR FOUR-FIVE  MAXIMUM ACCESS (MAS) POSTERIOR LUMBAR INTERBODY FUSION (PLIF) with extension of instrumentation LUMBAR TWO-FIVE;  Surgeon: Tia Alertavid S Jones, MD;  [facility];  Service: Neurosurgery;  Laterality: N/A;   MAXIMUM ACCESS (MAS)POSTERIOR LUMBAR INTERBODY FUSION (PLIF) 2 LEVEL N/A 12/13/2015   Procedure: Lumbar  two-three - Lumbar three-four MAXIMUM ACCESS (MAS) POSTERIOR LUMBAR INTERBODY FUSION (PLIF)  ;  Surgeon: Tia Alert, MD;  Location: [facility]URO ORS;  Service: Neurosurgery;  Laterality: N/A;   REVERSE SHOULDER ARTHROPLASTY Left 03/01/2020   Procedure: REVERSE SHOULDER ARTHROPLASTY;  Surgeon: Christena Flake, MD;  Location: [facility] ORS;  Service: Orthopedics;  Laterality: Left;   REVERSE SHOULDER ARTHROPLASTY Right 07/03/2022   Procedure: REVERSE SHOULDER ARTHROPLASTY W/ BICEPS TENODESIS.;  Surgeon: Christena Flake, MD;  Location: [facility] ORS;  Service: Orthopedics;  Laterality: Right;   SHOULDER SURGERY     RIGHT    TONSILLECTOMY     Patient Active Problem List   Diagnosis Date Noted   S/P lumbar laminectomy 03/01/2021   Mild nonproliferative diabetic retinopathy of  both eyes without macular edema associated with type 2 diabetes mellitus 01/03/2019   Chronic cough 11/19/2016   Morbid obesity 09/26/2016   Left ovarian cyst 05/30/2016   S/P lumbar spinal fusion 12/13/2015   Aortic calcification 07/18/2015   Controlled type 2 diabetes mellitus without complication 05/16/2015   Thrombocytopenia 05/16/2015   Recurrent major depressive disorder, in full remission 05/16/2015   Essential (primary) hypertension 05/16/2015   Fatty infiltration of liver 03/15/2015   Aortic valve stenosis, nonrheumatic 01/18/2015   Sciatica of right side 01/09/2015   Type 2 diabetes mellitus 11/10/2014   RLS (restless legs syndrome) 08/23/2014   Neuritis or radiculitis due to rupture of lumbar intervertebral disc 06/13/2014   Degeneration of intervertebral disc of lumbar region 06/13/2014   Lumbar radiculitis 06/13/2014   Arthritis 01/23/2014   Arthritis of knee, degenerative 11/15/2013   Depression 09/29/2013   Insomnia 09/29/2013   Apnea, sleep 08/29/2013   History of nephrolithiasis 08/29/2013   Abnormal presence of protein in urine 08/29/2013   Headache, migraine 08/29/2013   BP (high blood pressure) 08/29/2013   HLD (hyperlipidemia) 08/29/2013   Allergic rhinitis 08/29/2013   Intractable migraine without aura 02/18/2013    REFERRING DIAG: Reverse right total shoulder arthroplasty with biceps tenodesis.    THERAPY DIAG:  Right shoulder pain, unspecified chronicity  Stiffness of right shoulder, not elsewhere classified  Muscle weakness (generalized)  Rationale for Evaluation and Treatment Rehabilitation  PERTINENT HISTORY: S/P R reverse total shoulder replacement on 07/03/2022. Feels a little bit aggravated currently after taking a shower but usually goes away. Has had a couple of PT visits for home health. Exercises included pendulums and PROM.   PRECAUTIONS: PROM during initial weeks, no reaching behind back   SUBJECTIVE:   SUBJECTIVE STATEMENT: R  shoulder is feeling better than the rest of her. No pain currently.    PAIN:  Are you having pain? See subjective   TODAY'S TREATMENT:                                                                                                                                         DATE:  08/13/2022  Therapeutic exercises  Currently following the [facility] Reverse Total Shoulder Replacement Protocol  Supine R shoulder PROM/AAROM with PT   Flexion 10x3  Scaption 10x3  Abduction 10x3  Supine R shoulder PROM/AAROM with L UE assist   Flexion 10x3  Cues to perform properly.    Hooklying transversus abdominis contraction 10x3 with 5 second hold   Seated B scapular retraction 10x5 seconds for 2 sets     Improved exercise technique, movement at target joints, use of target muscles after mod verbal, visual, tactile cues.       Response to treatment Pt tolerated session well without aggravation of shoulder symptoms.   Clinical impression  Improving PROM R shoulder flexion and abduction observed in supine.  Continued working on R shoulder PROM/AAROM to decrease stiffness.  Continued working on scapular muscle activation to promote proper mechanics when able to actively raise her arm.  Challenges to progress include low back pain with LE symptoms. Pt will benefit from continued skilled physical therapy services to improve ROM, strength, and function.        PATIENT EDUCATION: Education details: there-ex Person educated: Patient Education method: Explanation and Verbal cues Education comprehension: verbalized understanding and returned demonstration  HOME EXERCISE PROGRAM: Access Code: [email or web address] URL: [email or web address] Date: 08/04/2022 Prepared by: Loralyn Freshwater  Exercises - Supine Shoulder Flexion AAROM with Hands Clasped  - 1 x daily - 7 x weekly - 3 sets - 10 reps      PT Short Term Goals - 07/22/22 1032       PT SHORT TERM GOAL #1   Title  Patient will be independent with her initial HEP to improve ROM and R UE function.    Time 3    Period Weeks    Status New    Target Date 08/14/22              PT Long Term Goals - 07/22/22 1033       PT LONG TERM GOAL #1   Title Pt will have a leat 120 degrees R shoulder flexion and abduction A/AROM to promote ability to reach and use her R UE for functional tasks.    Baseline R shoulder PROM 95 degrees flexion, 72 degrees abduction (07/22/2022)    Time 12    Period Weeks    Status New    Target Date 10/16/22      PT LONG TERM GOAL #2   Title Pt will improve R shoulder ER A/AROM to at least 60 degrees to promote ability to reach behind her head.    Baseline R shoulder ER PROM in scapular plane: 33 degrees (07/22/2022)    Time 12    Period Weeks    Status New    Target Date 10/16/22      PT LONG TERM GOAL #3   Title Pt will have at least 4/5 R shoulder strength at all planes to promote ability to use her R UE for functional tasks.    Baseline R shoulder strength not tested yet to allow for more healing (07/22/2022)    Time 12    Period Weeks    Status New    Target Date 10/16/22      PT LONG TERM GOAL #4   Title Pt will improve R shoulder FOTO score to at least 50 as a demonstration of improved function.    Baseline R shoulder FOTO score 34 (07/22/2022)    Time 12    Period Weeks  Status New    Target Date 10/16/22              Plan - 08/13/22 1549     Clinical Impression Statement Improving PROM R shoulder flexion and abduction observed in supine.  Continued working on R shoulder PROM/AAROM to decrease stiffness.  Continued working on scapular muscle activation to promote proper mechanics when able to actively raise her arm.  Challenges to progress include low back pain with LE symptoms. Pt will benefit from continued skilled physical therapy services to improve ROM, strength, and function.    Personal Factors and Comorbidities Comorbidity 3+;Age;Past/Current  Experience;Time since onset of injury/illness/exacerbation;Fitness    Comorbidities Anxiety, depression, HTN, DM, arthritis    Examination-Activity Limitations Bathing;Carry;Lift;Bed Mobility;Toileting;Dressing;Reach Overhead;Caring for Others;Self Feeding;Hygiene/Grooming    Stability/Clinical Decision Making Stable/Uncomplicated    Rehab Potential Fair    PT Frequency 2x / week    PT Duration 12 weeks    PT Treatment/Interventions Therapeutic activities;Therapeutic exercise;Neuromuscular re-education;Patient/family education;Manual techniques;Aquatic Therapy;Electrical Stimulation    PT Next Visit Plan PROM, manual techniques, modalities PRN    Consulted and Agree with Plan of Care Patient              Loralyn Freshwater PT, DPT  08/13/2022, 4:31 PM

## 2022-08-18 ENCOUNTER — Emergency Department: Payer: Medicare HMO

## 2022-08-18 ENCOUNTER — Other Ambulatory Visit: Payer: Self-pay

## 2022-08-18 DIAGNOSIS — R55 Syncope and collapse: Secondary | ICD-10-CM | POA: Diagnosis present

## 2022-08-18 DIAGNOSIS — Z7982 Long term (current) use of aspirin: Secondary | ICD-10-CM | POA: Insufficient documentation

## 2022-08-18 DIAGNOSIS — Z7984 Long term (current) use of oral hypoglycemic drugs: Secondary | ICD-10-CM | POA: Insufficient documentation

## 2022-08-18 DIAGNOSIS — E039 Hypothyroidism, unspecified: Secondary | ICD-10-CM | POA: Insufficient documentation

## 2022-08-18 DIAGNOSIS — E860 Dehydration: Secondary | ICD-10-CM | POA: Insufficient documentation

## 2022-08-18 DIAGNOSIS — I10 Essential (primary) hypertension: Secondary | ICD-10-CM | POA: Diagnosis not present

## 2022-08-18 DIAGNOSIS — J45909 Unspecified asthma, uncomplicated: Secondary | ICD-10-CM | POA: Diagnosis not present

## 2022-08-18 DIAGNOSIS — Z96611 Presence of right artificial shoulder joint: Secondary | ICD-10-CM | POA: Insufficient documentation

## 2022-08-18 DIAGNOSIS — E119 Type 2 diabetes mellitus without complications: Secondary | ICD-10-CM | POA: Diagnosis not present

## 2022-08-18 DIAGNOSIS — Z96612 Presence of left artificial shoulder joint: Secondary | ICD-10-CM | POA: Insufficient documentation

## 2022-08-18 DIAGNOSIS — Z79899 Other long term (current) drug therapy: Secondary | ICD-10-CM | POA: Insufficient documentation

## 2022-08-18 LAB — URINALYSIS, ROUTINE W REFLEX MICROSCOPIC
Bacteria, UA: NONE SEEN
Bilirubin Urine: NEGATIVE
Glucose, UA: >=500 mg/dL — AB
Hgb urine dipstick: NEGATIVE
Ketones, ur: NEGATIVE mg/dL
Leukocytes,Ua: NEGATIVE
Nitrite: NEGATIVE
Protein, ur: NEGATIVE mg/dL
Specific Gravity, Urine: 1.025 (ref 1.005–1.030)
Squamous Epithelial / HPF: NONE SEEN /HPF (ref 0–5)
pH: 5 (ref 5.0–8.0)

## 2022-08-18 LAB — COMPREHENSIVE METABOLIC PANEL
ALT: 13 U/L (ref 0–44)
AST: 16 U/L (ref 15–41)
Albumin: 4.4 g/dL (ref 3.5–5.0)
Alkaline Phosphatase: 51 U/L (ref 38–126)
Anion gap: 7 (ref 5–15)
BUN: 22 mg/dL (ref 8–23)
CO2: 26 mmol/L (ref 22–32)
Calcium: 9.3 mg/dL (ref 8.9–10.3)
Chloride: 110 mmol/L (ref 98–111)
Creatinine, Ser: 1.01 mg/dL — ABNORMAL HIGH (ref 0.44–1.00)
GFR, Estimated: 59 mL/min — ABNORMAL LOW (ref >60)
Glucose, Bld: 160 mg/dL — ABNORMAL HIGH (ref 70–99)
Potassium: 3.8 mmol/L (ref 3.5–5.1)
Sodium: 143 mmol/L (ref 135–145)
Total Bilirubin: 0.7 mg/dL (ref 0.3–1.2)
Total Protein: 7 g/dL (ref 6.5–8.1)

## 2022-08-18 LAB — CBC
HCT: 41.6 % (ref 36.0–46.0)
Hemoglobin: 13.7 g/dL (ref 12.0–15.0)
MCH: 30.2 pg (ref 26.0–34.0)
MCHC: 32.9 g/dL (ref 30.0–36.0)
MCV: 91.6 fL (ref 80.0–100.0)
Platelets: 163 10*3/uL (ref 150–400)
RBC: 4.54 MIL/uL (ref 3.87–5.11)
RDW: 13.2 % (ref 11.5–15.5)
WBC: 5.5 10*3/uL (ref 4.0–10.5)
nRBC: 0 % (ref 0.0–0.2)

## 2022-08-18 LAB — CBG MONITORING, ED: Glucose-Capillary: 149 mg/dL — ABNORMAL HIGH (ref 70–99)

## 2022-08-18 LAB — TROPONIN I (HIGH SENSITIVITY): Troponin I (High Sensitivity): 2 ng/L (ref <18)

## 2022-08-18 NOTE — ED Notes (Signed)
CBG checked in triage by this EDT. CBG 149

## 2022-08-18 NOTE — ED Triage Notes (Signed)
EMS brings pt in from home for c/o "body aches" tonight

## 2022-08-18 NOTE — ED Triage Notes (Signed)
Pt presents to ER acc. by husband with c/o brief episode tonight, where pt was sitting down, and pt started to "doze off and start kinda shaking."  Pt states shaking was all over, but she remembers the episode happening.  Pt's husband brought [place] a [place] ems base, where they checked her CBG, and VS.  CBG was 124 at the ems base.  Pt denies hx of seizures, strokes, or neurological issues.  Pt is currently A&O x4 and in NAD in triage.

## 2022-08-19 ENCOUNTER — Observation Stay
Admission: EM | Admit: 2022-08-19 | Discharge: 2022-08-19 | Discharge status: Against Medical Advice | Payer: Medicare HMO | Attending: Internal Medicine | Admitting: Internal Medicine

## 2022-08-19 ENCOUNTER — Ambulatory Visit: Payer: Medicare HMO

## 2022-08-19 ENCOUNTER — Telehealth: Payer: Self-pay

## 2022-08-19 DIAGNOSIS — E860 Dehydration: Secondary | ICD-10-CM

## 2022-08-19 DIAGNOSIS — R55 Syncope and collapse: Principal | ICD-10-CM

## 2022-08-19 DIAGNOSIS — G2581 Restless legs syndrome: Secondary | ICD-10-CM | POA: Diagnosis present

## 2022-08-19 DIAGNOSIS — I350 Nonrheumatic aortic (valve) stenosis: Secondary | ICD-10-CM

## 2022-08-19 DIAGNOSIS — F32A Depression, unspecified: Secondary | ICD-10-CM | POA: Diagnosis present

## 2022-08-19 DIAGNOSIS — Z9889 Other specified postprocedural states: Secondary | ICD-10-CM

## 2022-08-19 DIAGNOSIS — E119 Type 2 diabetes mellitus without complications: Secondary | ICD-10-CM

## 2022-08-19 DIAGNOSIS — I10 Essential (primary) hypertension: Secondary | ICD-10-CM | POA: Diagnosis present

## 2022-08-19 DIAGNOSIS — G4733 Obstructive sleep apnea (adult) (pediatric): Secondary | ICD-10-CM

## 2022-08-19 LAB — TROPONIN I (HIGH SENSITIVITY): Troponin I (High Sensitivity): 3 ng/L (ref <18)

## 2022-08-19 LAB — HEMOGLOBIN A1C
Hgb A1c MFr Bld: 6 % — ABNORMAL HIGH (ref 4.8–5.6)
Mean Plasma Glucose: 125.5 mg/dL

## 2022-08-19 LAB — URINE DRUG SCREEN, QUALITATIVE (ARMC ONLY)
Amphetamines, Ur Screen: NOT DETECTED
Barbiturates, Ur Screen: NOT DETECTED
Benzodiazepine, Ur Scrn: NOT DETECTED
Cannabinoid 50 Ng, Ur ~~LOC~~: NOT DETECTED
Cocaine Metabolite,Ur ~~LOC~~: NOT DETECTED
MDMA (Ecstasy)Ur Screen: NOT DETECTED
Methadone Scn, Ur: NOT DETECTED
Opiate, Ur Screen: NOT DETECTED
Phencyclidine (PCP) Ur S: NOT DETECTED
Tricyclic, Ur Screen: NOT DETECTED

## 2022-08-19 MED ORDER — ONDANSETRON HCL 4 MG PO TABS: 4.0000 mg | ORAL_TABLET | Freq: Four times a day (QID) | ORAL | Status: DC | PRN

## 2022-08-19 MED ORDER — GABAPENTIN 400 MG PO CAPS: 800.0000 mg | ORAL_CAPSULE | Freq: Every day | ORAL | Status: DC

## 2022-08-19 MED ORDER — DULOXETINE HCL 60 MG PO CPEP: 60.0000 mg | ORAL_CAPSULE | Freq: Every day | ORAL | Status: DC

## 2022-08-19 MED ORDER — OXYCODONE HCL 5 MG PO TABS: 5.0000 mg | ORAL_TABLET | ORAL | Status: DC | PRN

## 2022-08-19 MED ORDER — KETOROLAC TROMETHAMINE 30 MG/ML IJ SOLN
15.0000 mg | Freq: Once | INTRAMUSCULAR | Status: AC
  Administered 2022-08-19: 15 mg via INTRAVENOUS
  Filled 2022-08-19: qty 1

## 2022-08-19 MED ORDER — METFORMIN HCL 500 MG PO TABS: 500.0000 mg | ORAL_TABLET | Freq: Two times a day (BID) | ORAL | Status: DC

## 2022-08-19 MED ORDER — IRBESARTAN 150 MG PO TABS
150.0000 mg | ORAL_TABLET | Freq: Every day | ORAL | Status: DC
  Filled 2022-08-19: qty 1

## 2022-08-19 MED ORDER — INSULIN ASPART 100 UNIT/ML IJ SOLN: 0.0000 [IU] | Freq: Every day | INTRAMUSCULAR | Status: DC

## 2022-08-19 MED ORDER — ONDANSETRON HCL 4 MG/2ML IJ SOLN: 4.0000 mg | Freq: Four times a day (QID) | INTRAMUSCULAR | Status: DC | PRN

## 2022-08-19 MED ORDER — CELECOXIB 200 MG PO CAPS
200.0000 mg | ORAL_CAPSULE | Freq: Two times a day (BID) | ORAL | Status: DC
  Filled 2022-08-19: qty 1

## 2022-08-19 MED ORDER — ENOXAPARIN SODIUM 40 MG/0.4ML IJ SOSY: 40.0000 mg | PREFILLED_SYRINGE | INTRAMUSCULAR | Status: DC

## 2022-08-19 MED ORDER — LEVOTHYROXINE SODIUM 112 MCG PO TABS
112.0000 ug | ORAL_TABLET | Freq: Every day | ORAL | Status: DC
  Filled 2022-08-19: qty 1

## 2022-08-19 MED ORDER — ACETAMINOPHEN 325 MG RE SUPP: 650.0000 mg | Freq: Four times a day (QID) | RECTAL | Status: DC | PRN

## 2022-08-19 MED ORDER — GABAPENTIN 400 MG PO CAPS
400.0000 mg | ORAL_CAPSULE | Freq: Every day | ORAL | Status: DC
  Filled 2022-08-19: qty 1

## 2022-08-19 MED ORDER — MORPHINE SULFATE (PF) 2 MG/ML IV SOLN: 2.0000 mg | INTRAVENOUS | Status: DC | PRN

## 2022-08-19 MED ORDER — GABAPENTIN 800 MG PO TABS: 800.0000 mg | ORAL_TABLET | Freq: Two times a day (BID) | ORAL | Status: DC

## 2022-08-19 MED ORDER — INSULIN ASPART 100 UNIT/ML IJ SOLN: 0.0000 [IU] | Freq: Three times a day (TID) | INTRAMUSCULAR | Status: DC

## 2022-08-19 MED ORDER — ASPIRIN 81 MG PO TBEC: 81.0000 mg | DELAYED_RELEASE_TABLET | Freq: Every day | ORAL | Status: DC

## 2022-08-19 MED ORDER — ACETAMINOPHEN 325 MG PO TABS: 650.0000 mg | ORAL_TABLET | Freq: Four times a day (QID) | ORAL | Status: DC | PRN

## 2022-08-19 MED ORDER — HYDROCODONE-ACETAMINOPHEN 5-325 MG PO TABS: 1.0000 | ORAL_TABLET | ORAL | Status: DC | PRN

## 2022-08-19 MED ORDER — HALOPERIDOL LACTATE 5 MG/ML IJ SOLN
5.0000 mg | Freq: Once | INTRAMUSCULAR | Status: AC
  Administered 2022-08-19: 5 mg via INTRAVENOUS
  Filled 2022-08-19: qty 1

## 2022-08-19 MED ORDER — SODIUM CHLORIDE 0.9% FLUSH: 3.0000 mL | Freq: Two times a day (BID) | INTRAVENOUS | Status: DC

## 2022-08-19 MED ORDER — SODIUM CHLORIDE 0.9 % IV BOLUS
1000.0000 mL | Freq: Once | INTRAVENOUS | Status: AC
  Administered 2022-08-19: 1000 mL via INTRAVENOUS

## 2022-08-19 NOTE — Assessment & Plan Note (Addendum)
Uncertain etiology, unknown whether medication related CT head negative and UDS negative  Syncope workup

## 2022-08-19 NOTE — Discharge Summary (Signed)
Physician Discharge Summary   Patient: Jessica Cole MRN: [id number] DOB: February 28, 1951  Admit date:     08/19/2022  Discharge date: 08/19/22  Discharge Physician: Andris Baumann   PCP: Lynnea Ferrier, MD   [facility] Discharge Summary  Patient absconded from the ED a few hours after admission. She was encouraged to return to sign the AMA which she did. Please see history and physical for details    Discharge Diagnoses: Principal Problem:   Syncope Active Problems:   Anxiety and depression   RLS (restless legs syndrome)   OSA on CPAP   Aortic valve stenosis, nonrheumatic   Type 2 diabetes mellitus   Essential (primary) hypertension   S/P lumbar laminectomy  Resolved Problems:   * No resolved hospital problems. *  Hospital Course: Please see history and physical.  Against Medical Advice   Patient at this time expresses desire to leave the Hospital immediately, patient has been advised that this is not Medically advisable at this time, and can result in Medical complications like Death and Disability.  Patient has full decision making capacity and understands and accepts the risks involved and assumes full responsibilty of this decision as indicated by signing facility AMA form.    Assessment and Plan: * Syncope Uncertain etiology, unknown whether medication related CT head negative and UDS negative  Syncope workup  S/P lumbar laminectomy Pain control  Essential (primary) hypertension Continue home meds  Type 2 diabetes mellitus Sliding scale coverage  OSA on CPAP CPAP  RLS (restless legs syndrome) Continue home meds  Anxiety and depression Continue cymbalta     DISCHARGE MEDICATION:   Discharge Exam: Filed Weights   08/18/22 2201  Weight: 79.8 kg      08/19/2022    3:22 AM 08/19/2022    2:41 AM 08/19/2022    1:37 AM  Vitals with BMI  Systolic 158 145 324  Diastolic 91 99 84  Pulse 84 88 78     The results of significant diagnostics from this  hospitalization (including imaging, microbiology, ancillary and laboratory) are listed below for reference.   Imaging Studies: CT HEAD WO CONTRAST ( )  Result Date: 08/18/2022 CLINICAL DATA:  Syncope/presyncope, cerebrovascular cause suspected EXAM: CT HEAD WITHOUT CONTRAST TECHNIQUE: Contiguous axial images were obtained from the base of the skull through the vertex without intravenous contrast. RADIATION DOSE REDUCTION: This exam was performed according to the departmental dose-optimization program which includes automated exposure control, adjustment of the mA and/or kV according to patient size and/or use of iterative reconstruction technique. COMPARISON:  11/24/2016 FINDINGS: Brain: Normal anatomic configuration. Parenchymal volume loss is commensurate with the patient's age. Mild periventricular white matter changes are present likely reflecting the sequela of small vessel ischemia. No abnormal intra or extra-axial mass lesion or fluid collection. No abnormal mass effect or midline shift. No evidence of acute intracranial hemorrhage or infarct. Ventricular size is normal. Cerebellum unremarkable. Vascular: No asymmetric hyperdense vasculature at the skull base. Skull: Intact Sinuses/Orbits: Paranasal sinuses are clear. Orbits are unremarkable. Other: Mastoid air cells and middle ear cavities are clear. IMPRESSION: 1. No acute intracranial hemorrhage or infarct. 2. Mild senescent change. Electronically Signed   By: Helyn Numbers M.D.   On: 08/18/2022 22:41    Microbiology: Results for orders placed or performed during the hospital encounter of 06/24/22  Surgical pcr screen     Status: Abnormal   Collection Time: 06/24/22 11:02 AM   Specimen: Nasal Mucosa; Nasal Swab  Result Value Ref Range  Status   MRSA, PCR POSITIVE (A) NEGATIVE Final    Comment: RESULT CALLED TO, READ BACK BY AND VERIFIED WITH: BRYAN GRAY 06/24/22 1422 MW    Staphylococcus aureus POSITIVE (A) NEGATIVE Final    Comment:  (NOTE) The Xpert SA Assay (FDA approved for NASAL specimens in patients 80 years of age and older), is one component of a comprehensive surveillance program. It is not intended to diagnose infection nor to guide or monitor treatment. Performed at [facility], [address] [place], [place] [address]     Labs: CBC: Recent Labs  Lab 08/18/22 2204  WBC 5.5  HGB 13.7  HCT 41.6  MCV 91.6  PLT 163   Basic Metabolic Panel: Recent Labs  Lab 08/18/22 2204  NA 143  K 3.8  CL 110  CO2 26  GLUCOSE 160*  BUN 22  CREATININE 1.01*  CALCIUM 9.3   Liver Function Tests: Recent Labs  Lab 08/18/22 2204  AST 16  ALT 13  ALKPHOS 51  BILITOT 0.7  PROT 7.0  ALBUMIN 4.4   CBG: Recent Labs  Lab 08/18/22 2205  GLUCAP 149*    Discharge time spent: less than 30 minutes.  Signed: Andris Baumann, MD [facility] 08/19/2022

## 2022-08-19 NOTE — ED Provider Notes (Signed)
[facility] Provider Note    Event Date/Time   First MD Initiated Contact with Patient 08/19/22 [phone]     (approximate)   History   Near Syncope and Shaking   HPI  Jessica Cole is a 72 y.o. female who presents to the ED from home with a chief complaint of unresponsive numbness.  Patient states she had eaten dinner, felt cold and remembers feeling like she was shaking.  Has been thought patient had dozed off then noted her shaking.  Denies seizure activity.  No prior history of seizures or strokes.  Right rotator cuff surgery 6 weeks ago, recovering fine.  Sciatica issues x 2 weeks with history of sciatica.  Denies headache, vision changes, neck pain, chest pain, shortness of breath, abdominal pain, nausea, vomiting or dizziness.     Past Medical History   Past Medical History:  Diagnosis Date   Anxiety    Asthma    seasonal with allergies   Colon polyps    Degenerative arthritis    Depression    Environmental allergies    Family history of adverse reaction to anesthesia    a.) PONV in 1st degree relative (sister)   Fatty liver    Glaucoma    History of bronchitis    History of kidney stones    History of pneumonia    Hypertension    Hypothyroidism    Migraines    Nose colonized with MRSA 02/14/2021   a.) preoperative PCR (+) on 02/14/2021 prior to LUMBAR MICRODISCECTOMY; b.) preoperative PCR (+) 06/24/2022 prior to REVERSE SHOULDER ARTHROPLASTY W/ BICEPS TENODESIS   Obesity    OSA on CPAP    Plantar fasciitis    right   Pneumonia    Renal calculi    Rheumatoid arthritis    RLS (restless legs syndrome) 08/23/2014   T2DM (type 2 diabetes mellitus)      Active Problem List   Patient Active Problem List   Diagnosis Date Noted   Syncope 08/19/2022   S/P lumbar laminectomy 03/01/2021   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 edema associated with type 2 diabetes mellitus 01/03/2019   Chronic cough  11/19/2016   Morbid obesity 09/26/2016   Left ovarian cyst 05/30/2016   S/P lumbar spinal fusion 12/13/2015   Aortic calcification 07/18/2015   Controlled type 2 diabetes mellitus without complication 05/16/2015   Thrombocytopenia 05/16/2015   Recurrent major depressive disorder, in full remission 05/16/2015   Essential (primary) hypertension 05/16/2015   Fatty infiltration of liver 03/15/2015   Aortic valve stenosis, nonrheumatic 01/18/2015   Sciatica of right side 01/09/2015   Type 2 diabetes mellitus 11/10/2014   RLS (restless legs syndrome) 08/23/2014   Neuritis or radiculitis due to rupture of lumbar intervertebral disc 06/13/2014   Degeneration of intervertebral disc of lumbar region 06/13/2014   Lumbar radiculitis 06/13/2014   Arthritis 01/23/2014   Arthritis of knee, degenerative 11/15/2013   Anxiety and depression 09/29/2013   Insomnia 09/29/2013   OSA on CPAP 08/29/2013   History of nephrolithiasis 08/29/2013   Abnormal presence of protein in urine 08/29/2013   Headache, migraine 08/29/2013   BP (high blood pressure) 08/29/2013   HLD (hyperlipidemia) 08/29/2013   Allergic rhinitis 08/29/2013   Intractable migraine without aura 02/18/2013     Past Surgical History   Past Surgical History:  Procedure Laterality Date   ABDOMINAL HYSTERECTOMY     partial   APPENDECTOMY     Arthroscopic surgery, knee  Left    BREAST BIOPSY Right    several   BREAST BIOPSY Right 03/11/2017   u/s bx neg   BUNIONECTOMY     LEFT    COLONOSCOPY W/ POLYPECTOMY     COLONOSCOPY WITH PROPOFOL N/A 08/14/2017   Procedure: COLONOSCOPY WITH PROPOFOL;  Surgeon: Christena Deem, MD;  Location: [facility] ENDOSCOPY;  Service: Endoscopy;  Laterality: N/A;   EXTRACORPOREAL SHOCK WAVE LITHOTRIPSY Left 05/14/2017   Procedure: EXTRACORPOREAL SHOCK WAVE LITHOTRIPSY (ESWL);  Surgeon: Riki Altes, MD;  Location: [facility] ORS;  Service: Urology;  Laterality: Left;   EXTRACORPOREAL SHOCK WAVE LITHOTRIPSY  Left 06/14/2020   Procedure: EXTRACORPOREAL SHOCK WAVE LITHOTRIPSY (ESWL);  Surgeon: Sondra Come, MD;  Location: [facility] ORS;  Service: Urology;  Laterality: Left;   FOOT SURGERY     RIGHT     KNEE ARTHROSCOPY W/ MENISCAL REPAIR Left    LASIK     LUMBAR LAMINECTOMY/DECOMPRESSION MICRODISCECTOMY Right 03/01/2021   Procedure: Microdiscectomy - Lumbar five-Sacral one - right;  Surgeon: Tia Alert, MD;  Location: [facility] OR;  Service: Neurosurgery;  Laterality: Right;   MAXIMUM ACCESS (MAS)POSTERIOR LUMBAR INTERBODY FUSION (PLIF) 1 LEVEL N/A 04/25/2016   Procedure: LUMBAR FOUR-FIVE  MAXIMUM ACCESS (MAS) POSTERIOR LUMBAR INTERBODY FUSION (PLIF) with extension of instrumentation LUMBAR TWO-FIVE;  Surgeon: Tia Alert, MD;  Location: [facility] OR;  Service: Neurosurgery;  Laterality: N/A;   MAXIMUM ACCESS (MAS)POSTERIOR LUMBAR INTERBODY FUSION (PLIF) 2 LEVEL N/A 12/13/2015   Procedure: Lumbar two-three - Lumbar three-four MAXIMUM ACCESS (MAS) POSTERIOR LUMBAR INTERBODY FUSION (PLIF)  ;  Surgeon: Tia Alert, MD;  Location: [facility] NEURO ORS;  Service: Neurosurgery;  Laterality: N/A;   REVERSE SHOULDER ARTHROPLASTY Left 03/01/2020   Procedure: REVERSE SHOULDER ARTHROPLASTY;  Surgeon: Christena Flake, MD;  Location: [facility] ORS;  Service: Orthopedics;  Laterality: Left;   REVERSE SHOULDER ARTHROPLASTY Right 07/03/2022   Procedure: REVERSE SHOULDER ARTHROPLASTY W/ BICEPS TENODESIS.;  Surgeon: Christena Flake, MD;  Location: [facility] ORS;  Service: Orthopedics;  Laterality: Right;   SHOULDER SURGERY     RIGHT    TONSILLECTOMY       Home Medications   Prior to Admission medications   Medication Sig Start Date End Date Taking? Authorizing Provider  alendronate (FOSAMAX) 70 MG/75ML solution Take 70 mg by mouth every 7 (seven) days. Take with a full glass of water on an empty stomach.    [provider]  aspirin 81 MG EC tablet Take 81 mg by mouth daily.    [provider]  azelastine (ASTELIN) 0.1 % nasal  spray Place 1 spray into both nostrils daily as needed for rhinitis or allergies. 11/23/15   [provider]  celecoxib (CELEBREX) 200 MG capsule Take 200 mg by mouth 2 (two) times daily.    [provider]  Cyanocobalamin (B-12 COMPLIANCE INJECTION IJ) Inject as directed every 30 (thirty) days.    [provider]  DULoxetine (CYMBALTA) 60 MG capsule Take 60 mg by mouth daily.    [provider]  empagliflozin (JARDIANCE) 10 MG TABS tablet Take 10 mg by mouth every other day. Takes 1/2 tablet twice day    [provider]  fluticasone (FLONASE) 50 MCG/ACT nasal spray Place 2 sprays into both nostrils daily as needed for allergies. 04/12/14   [provider]  gabapentin (NEURONTIN) 800 MG tablet Take 1 tablet (800 mg total) by mouth 2 (two) times daily. Takes 400 mg in daytime and 800 mg at bedtime 07/03/22  Poggi, Excell Seltzer, MD  hydroxychloroquine (PLAQUENIL) 200 MG tablet Take by mouth 2 (two) times daily.    [provider]  levothyroxine (SYNTHROID, LEVOTHROID) 112 MCG tablet Take 112 mcg by mouth daily before breakfast.  11/07/15   [provider]  metFORMIN (GLUCOPHAGE) 1000 MG tablet Take 500 mg by mouth 2 (two) times daily with a meal.    [provider]  ONE TOUCH ULTRA TEST test strip USE 2 (TWO) TIMES DAILY USE AS INSTRUCTED. 11/13/17   [provider]  oxyCODONE (ROXICODONE) 5 MG immediate release tablet Take 1-2 tablets (5-10 mg total) by mouth every 4 (four) hours as needed for moderate pain or severe pain. 07/03/22   Poggi, Excell Seltzer, MD  pravastatin (PRAVACHOL) 20 MG tablet Take 20 mg by mouth at bedtime.    [provider]  solifenacin (VESICARE) 5 MG tablet Take 10 mg by mouth daily. 05/29/20   [provider]  telmisartan (MICARDIS) 80 MG tablet Take 80 mg by mouth daily. 01/02/19   [provider]     Allergies  Diazepam and Dexamethasone   Family History   Family History   Problem Relation Age of Onset   Lung cancer Mother    Congestive Heart Failure Father    Depression Sister    Rheum arthritis Sister    Headache Maternal Grandfather    Migraines Maternal Grandfather    Headache Maternal Uncle    Diabetes Other    Heart disease Other    Hypertension Other    Breast cancer Neg Hx    Bladder Cancer Neg Hx    Kidney cancer Neg Hx      Physical Exam  Triage Vital Signs: ED Triage Vitals  Enc Vitals Group     BP 08/18/22 2200 124/78     Pulse Rate 08/18/22 2200 75     Resp 08/18/22 2200 16     Temp 08/18/22 2200 98.5 F (36.9 C)     Temp Source 08/18/22 2200 Oral     SpO2 08/18/22 2145 95 %     Weight 08/18/22 2201 176 lb (79.8 kg)     Height 08/18/22 2201  (1.549 m)     Head Circumference --      Peak Flow --      Pain Score 08/18/22 2201 0     Pain Loc --      Pain Edu? --      Excl. in GC? --     Updated Vital Signs: BP (!) 158/91   Pulse 84   Temp 98.2 F (36.8 C) (Oral)   Resp 15   Ht  (1.549 m)   Wt 79.8 kg   SpO2 100%   BMI 33.25 kg/m    General: Awake, no distress.  CV:  RRR.  Good peripheral perfusion.  Resp:  Normal effort.  CTAB. Abd:  Nontender.  No distention.  Other:  PERRL.  EOMI.  No carotid bruits.  Supple neck without meningismus.  Alert and oriented x 3.  CN II to XII grossly intact.  5/5 motor strength and sensation all extremities.  Right shoulder in postop sling.   ED Results / Procedures / Treatments  Labs (all labs ordered are listed, but only abnormal results are displayed) Labs Reviewed  COMPREHENSIVE METABOLIC PANEL - Abnormal; Notable for the following components:      Result Value   Glucose, Bld 160 (*)    Creatinine, Ser 1.01 (*)    GFR,  Estimated 59 (*)    All other components within normal limits  URINALYSIS, ROUTINE W REFLEX MICROSCOPIC - Abnormal; Notable for the following components:   Color, Urine YELLOW (*)    APPearance CLEAR (*)    Glucose, UA >=500 (*)    All other  components within normal limits  CBG MONITORING, ED - Abnormal; Notable for the following components:   Glucose-Capillary 149 (*)    All other components within normal limits  CBC  URINE DRUG SCREEN, QUALITATIVE ([facility] ONLY)  HEMOGLOBIN A1C  TROPONIN I (HIGH SENSITIVITY)  TROPONIN I (HIGH SENSITIVITY)     EKG  ED ECG REPORT I, Ronon Ferger J, the attending physician, personally viewed and interpreted this ECG.   Date: 08/19/2022  EKG Time: 2201  Rate: 74  Rhythm: normal sinus rhythm  Axis: Normal  Intervals:none  ST&T Change: Nonspecific    RADIOLOGY I have independently visualized and interpreted patient's CT head as well as noted the radiology interpretation:  CT head: No ICH  Official radiology report(s): CT HEAD WO CONTRAST ( )  Result Date: 08/18/2022 CLINICAL DATA:  Syncope/presyncope, cerebrovascular cause suspected EXAM: CT HEAD WITHOUT CONTRAST TECHNIQUE: Contiguous axial images were obtained from the base of the skull through the vertex without intravenous contrast. RADIATION DOSE REDUCTION: This exam was performed according to the departmental dose-optimization program which includes automated exposure control, adjustment of the mA and/or kV according to patient size and/or use of iterative reconstruction technique. COMPARISON:  11/24/2016 FINDINGS: Brain: Normal anatomic configuration. Parenchymal volume loss is commensurate with the patient's age. Mild periventricular white matter changes are present likely reflecting the sequela of small vessel ischemia. No abnormal intra or extra-axial mass lesion or fluid collection. No abnormal mass effect or midline shift. No evidence of acute intracranial hemorrhage or infarct. Ventricular size is normal. Cerebellum unremarkable. Vascular: No asymmetric hyperdense vasculature at the skull base. Skull: Intact Sinuses/Orbits: Paranasal sinuses are clear. Orbits are unremarkable. Other: Mastoid air cells and middle ear cavities are  clear. IMPRESSION: 1. No acute intracranial hemorrhage or infarct. 2. Mild senescent change. Electronically Signed   By: Helyn Numbers M.D.   On: 08/18/2022 22:41     PROCEDURES:  Critical Care performed: Yes, see critical care procedure note(s) CRITICAL CARE Performed by: Irean Hong   Total critical care time: 30 minutes  Critical care time was exclusive of separately billable procedures and treating other patients.  Critical care was necessary to treat or prevent imminent or life-threatening deterioration.  Critical care was time spent personally by me on the following activities: development of treatment plan with patient and/or surrogate as well as nursing, discussions with consultants, evaluation of patient's response to treatment, examination of patient, obtaining history from patient or surrogate, ordering and performing treatments and interventions, ordering and review of laboratory studies, ordering and review of radiographic studies, pulse oximetry and re-evaluation of patient's condition.   Marland Kitchen1-3 Lead EKG Interpretation  Performed by: Irean Hong, MD Authorized by: Irean Hong, MD     Interpretation: normal     ECG rate:  75   ECG rate assessment: normal     Rhythm: sinus rhythm     Ectopy: none     Conduction: normal   Comments:     Placed on cardiac monitor to evaluate for arrhythmias    MEDICATIONS ORDERED IN ED: Medications  oxyCODONE (Oxy IR/ROXICODONE) immediate release tablet 5-10 mg (has no administration in time range)  celecoxib (CELEBREX) capsule 200 mg (has no administration in time range)  aspirin EC tablet 81 mg (has no administration in time range)  irbesartan (AVAPRO) tablet 150 mg (has no administration in time range)  DULoxetine (CYMBALTA) DR capsule 60 mg (has no administration in time range)  levothyroxine (SYNTHROID) tablet 112 mcg (has no administration in time range)  metFORMIN (GLUCOPHAGE) tablet 500 mg (has no administration in time  range)  sodium chloride flush (NS) 0.9 % injection 3 mL (has no administration in time range)  insulin aspart (novoLOG) injection 0-20 Units (has no administration in time range)  insulin aspart (novoLOG) injection 0-5 Units (has no administration in time range)  enoxaparin (LOVENOX) injection 40 mg (has no administration in time range)  acetaminophen (TYLENOL) tablet 650 mg (has no administration in time range)    Or  acetaminophen (TYLENOL) suppository 650 mg (has no administration in time range)  HYDROcodone-acetaminophen (NORCO/VICODIN) 5-325 MG per tablet 1-2 tablet (has no administration in time range)  morphine (PF) 2 MG/ML injection 2 mg (has no administration in time range)  ondansetron (ZOFRAN) tablet 4 mg (has no administration in time range)    Or  ondansetron (ZOFRAN) injection 4 mg (has no administration in time range)  gabapentin (NEURONTIN) capsule 400 mg (has no administration in time range)    And  gabapentin (NEURONTIN) capsule 800 mg (has no administration in time range)  sodium chloride 0.9 % bolus 1,000 mL (0 mLs Intravenous Stopped 08/19/22 0225)  ketorolac (TORADOL) 30 MG/ML injection 15 mg (15 mg Intravenous Given 08/19/22 0105)  haloperidol lactate (HALDOL) injection 5 mg (5 mg Intravenous Given 08/19/22 0132)     IMPRESSION / MDM / ASSESSMENT AND PLAN / ED COURSE  I reviewed the triage vital signs and the nursing notes.                             72 year old female presenting with brief unresponsive episode.  Differential diagnosis includes but is not limited to ICH, CVA, TIA, ACS, infectious, metabolic etiologies, etc.  Personally viewed patient's records and note an orthopedic surgery postop follow-up on 07/18/2022.  Patient's presentation is most consistent with acute presentation with potential threat to life or bodily function.  The patient is on the cardiac monitor to evaluate for evidence of arrhythmia and/or significant heart rate changes.  Laboratory  results demonstrate normal WBC 5.5, normal creatinine which is increased from prior, initial troponin negative.  CT head negative.  Negative UA.  Awaiting repeat troponin.  Patient declines MRI brain.  Will administer IV fluids, IV Toradol for sciatica pain and reassess.  Clinical Course as of 08/19/22 0455  Tue Aug 19, 2022  0145 Patient had an episode of unresponsiveness, witnessed by her nurse.  She seemed to come to when I approached the bedside, then became unresponsive again.  This occurred prior to administration of IV Ketorolac.  This was followed by agitation when she was told she could not have anything by mouth currently.  Agitation required calming agent.  Given episodes of unclear etiology, will consult hospital services for evaluation and admission. [JS]    Clinical Course User Index [JS] Irean Hong, MD     FINAL CLINICAL IMPRESSION(S) / ED DIAGNOSES   Final diagnoses:  Near syncope  Dehydration  Syncope, unspecified syncope type     Rx / DC Orders   ED Discharge Orders     None        Note:  This document was prepared using Dragon voice recognition software  and may include unintentional dictation errors.   Irean Hong, MD 08/19/22 [phone]

## 2022-08-19 NOTE — ED Notes (Signed)
Writer called by assigned RN of pt to inform that pt left room without informing staff. Pt with IV in place on documentation without evidence of removal by pt. Writer called pt with spouse answering. Writer requested that pt come back to ED front desk for staff to visualize IV removal as well as signing AMA. Spouse reports he has not left and that he will come straight to front desk.

## 2022-08-19 NOTE — Telephone Encounter (Signed)
Called pt. Said she was doing ok. Went to the ER last night, walked out. Signed and "against medical advice document." Pt was recommended seeing a doctor to help resolve her medical situation. PT held off for now for pt safety until clearance from her doctor that pt is able to safely participate in PT. Next 3 PT sessions cancelled for now to allow time for pt to do so. Kept the 08/28/22 (Thursday 9:30 am)  appointment but pt was advised to call if pt is not yet appropriate to participate in PT to adjust her schedule accordingly. Pt verbalized understanding and states that she understands.

## 2022-08-19 NOTE — Assessment & Plan Note (Signed)
Pain control

## 2022-08-19 NOTE — Assessment & Plan Note (Signed)
-   Continue home meds °

## 2022-08-19 NOTE — ED Notes (Signed)
Pt's husband, Cori Razor, left for the night and left his contact # [phone]

## 2022-08-19 NOTE — ED Notes (Signed)
Pt placed on cardiac monitor 

## 2022-08-19 NOTE — Assessment & Plan Note (Signed)
CPAP.  

## 2022-08-19 NOTE — H&P (Signed)
History and Physical    Patient: Jessica Cole DOB: April 07, 1951 DOA: 08/19/2022 DOS: the patient was seen and examined on 08/19/2022 PCP: Lynnea Ferrier, MD  Patient coming from: Home  Chief Complaint:  Chief Complaint  Patient presents with   Near Syncope   Shaking    HPI: Jessica Cole is a 72 y.o. female with medical history significant for 72 year old female with a history of anxiety and depression, HTN, hypothyroidism, OSA on CPAP, restless leg syndrome, diabetes, who is s/p right rotator cuff repair 6 weeks prior and with history of sciatica, who was brought to the emergency room after her husband noted her to be shaking as though she had chills.  She was not previously ill.  She denies cough, congestion, fever.  Had no nausea, vomiting, abdominal pain, dysuria or diarrhea.  She denies headache or visual disturbance.  No past history of seizures.  Patient states lately her sciatica has been bothering her and she has been using her pain medication but has been having a hard time getting a comfortable position.  She states she is using her pain medication as prescribed.  While in the emergency room she had 2 episodes where she briefly appeared to lose consciousness but then gradually became alert and was again conversant.  Husband at the bedside states that this happened several months prior but they did not seek medical attention. ED course and data review:  labs unremarkable.  Urinalysis normal.  UDS clean. EKG, per personally viewed and interpreted showing NSR at 74 with nonspecific ST-T wave changes. CT head nonacute. Patient treated with an NS bolus, IV Toradol for sciatica pain and she was also given a dose of Haldol when she became agitated after she was told she could not get anything to have by mouth.  Hospitalist consulted for observation.   Review of Systems: As mentioned in the history of present illness. All other systems reviewed and are negative.  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   a.) preoperative PCR (+) on 02/14/2021 prior to LUMBAR MICRODISCECTOMY; b.) preoperative PCR (+) 06/24/2022 prior to REVERSE SHOULDER ARTHROPLASTY W/ BICEPS TENODESIS   Obesity    OSA on CPAP    Plantar fasciitis    right   Pneumonia    Renal calculi    Rheumatoid arthritis    RLS (restless legs syndrome) 08/23/2014   T2DM (type 2 diabetes mellitus)    Past Surgical History:  Procedure Laterality Date   ABDOMINAL HYSTERECTOMY     partial   APPENDECTOMY     Arthroscopic surgery, knee Left    BREAST BIOPSY Right    several   BREAST BIOPSY Right 03/11/2017   u/s bx neg   BUNIONECTOMY     LEFT    COLONOSCOPY W/ POLYPECTOMY     COLONOSCOPY WITH PROPOFOL N/A 08/14/2017   Procedure: COLONOSCOPY WITH PROPOFOL;  Surgeon: Christena Deem, MD;  Location: [facility];  Service: Endoscopy;  Laterality: N/A;   EXTRACORPOREAL SHOCK WAVE LITHOTRIPSY Left 05/14/2017   Procedure: EXTRACORPOREAL SHOCK WAVE LITHOTRIPSY (ESWL);  Surgeon: Riki Altes, MD;  Location: [facility] ORS;  Service: Urology;  Laterality: Left;   EXTRACORPOREAL SHOCK WAVE LITHOTRIPSY Left 06/14/2020   Procedure: EXTRACORPOREAL SHOCK WAVE LITHOTRIPSY (ESWL);  Surgeon: Sondra Come, MD;  Location: [facility] ORS;  Service: Urology;  Laterality: Left;   FOOT SURGERY     RIGHT     KNEE ARTHROSCOPY W/ MENISCAL REPAIR Left    LASIK     LUMBAR LAMINECTOMY/DECOMPRESSION MICRODISCECTOMY Right 03/01/2021   Procedure: Microdiscectomy - Lumbar five-Sacral one - right;  Surgeon:  Tia Alert, MD;  Location: [facility] OR;  Service: Neurosurgery;  Laterality: Right;   MAXIMUM ACCESS (MAS)POSTERIOR LUMBAR INTERBODY FUSION (PLIF) 1 LEVEL N/A 04/25/2016   Procedure: LUMBAR FOUR-FIVE  MAXIMUM ACCESS (MAS) POSTERIOR LUMBAR INTERBODY FUSION (PLIF) with extension of instrumentation LUMBAR TWO-FIVE;  Surgeon: Tia Alert, MD;  Location: [facility] OR;  Service: Neurosurgery;  Laterality: N/A;   MAXIMUM ACCESS (MAS)POSTERIOR LUMBAR INTERBODY FUSION (PLIF) 2 LEVEL N/A 12/13/2015   Procedure: Lumbar two-three - Lumbar three-four MAXIMUM ACCESS (MAS) POSTERIOR LUMBAR INTERBODY FUSION (PLIF)  ;  Surgeon: Tia Alert, MD;  Location: [facility];  Service: Neurosurgery;  Laterality: N/A;   REVERSE SHOULDER ARTHROPLASTY Left 03/01/2020   Procedure: REVERSE SHOULDER ARTHROPLASTY;  Surgeon: Christena Flake, MD;  Location: [facility] ORS;  Service: Orthopedics;  Laterality: Left;   REVERSE SHOULDER ARTHROPLASTY Right 07/03/2022   Procedure: REVERSE SHOULDER ARTHROPLASTY W/ BICEPS TENODESIS.;  Surgeon: Christena Flake, MD;  Location: [facility] ORS;  Service: Orthopedics;  Laterality: Right;   SHOULDER SURGERY     RIGHT    TONSILLECTOMY     Social History:  reports that she has never smoked. She has never used smokeless tobacco. She reports that she does not drink alcohol and does not use drugs.  Allergies  Allergen Reactions   Diazepam Other (See Comments)    hallucinations   Dexamethasone Other (See Comments)    During a tapered dose, once.  Was shaky (side effect, not allergy).    Family History  Problem Relation Age of Onset   Lung cancer Mother    Congestive Heart Failure Father    Depression Sister    Rheum arthritis Sister    Headache Maternal Grandfather    Migraines Maternal Grandfather    Headache Maternal Uncle    Diabetes Other    Heart disease Other    Hypertension Other    Breast cancer Neg Hx    Bladder Cancer Neg Hx    Kidney cancer Neg Hx     Prior to Admission medications    Medication Sig Start Date End Date Taking? Authorizing Provider  alendronate (FOSAMAX) 70 MG/75ML solution Take 70 mg by mouth every 7 (seven) days. Take with a full glass of water on an empty stomach.    [provider]  aspirin 81 MG EC tablet Take 81 mg by mouth daily.    [provider]  azelastine (ASTELIN) 0.1 % nasal spray Place 1 spray into both nostrils daily as needed for rhinitis or allergies. 11/23/15   [provider]  celecoxib (CELEBREX) 200 MG capsule Take 200 mg by mouth 2 (two) times daily.    [provider]  Cyanocobalamin (B-12 COMPLIANCE INJECTION IJ) Inject as directed every 30 (thirty) days.    [provider]  DULoxetine (CYMBALTA) 60  MG capsule Take 60 mg by mouth daily.    [provider]  empagliflozin (JARDIANCE) 10 MG TABS tablet Take 10 mg by mouth every other day. Takes 1/2 tablet twice day    [provider]  fluticasone (FLONASE) 50 MCG/ACT nasal spray Place 2 sprays into both nostrils daily as needed for allergies. 04/12/14   [provider]  gabapentin (NEURONTIN) 800 MG tablet Take 1 tablet (800 mg total) by mouth 2 (two) times daily. Takes 400 mg in daytime and 800 mg at bedtime 07/03/22   Poggi, Excell Seltzer, MD  hydroxychloroquine (PLAQUENIL) 200 MG tablet Take by mouth 2 (two) times daily.    [provider]  levothyroxine (SYNTHROID, LEVOTHROID) 112 MCG tablet Take 112 mcg by mouth daily before breakfast.  11/07/15   [provider]  metFORMIN (GLUCOPHAGE) 1000 MG tablet Take 500 mg by mouth 2 (two) times daily with a meal.    [provider]  ONE TOUCH ULTRA TEST test strip USE 2 (TWO) TIMES DAILY USE AS INSTRUCTED. 11/13/17   [provider]  oxyCODONE (ROXICODONE) 5 MG immediate release tablet Take 1-2 tablets (5-10 mg total) by mouth every 4 (four) hours as needed for moderate pain or severe pain. 07/03/22   Poggi, Excell Seltzer, MD  pravastatin (PRAVACHOL) 20 MG  tablet Take 20 mg by mouth at bedtime.    [provider]  solifenacin (VESICARE) 5 MG tablet Take 10 mg by mouth daily. 05/29/20   [provider]  telmisartan (MICARDIS) 80 MG tablet Take 80 mg by mouth daily. 01/02/19   [provider]    Physical Exam: Vitals:   08/18/22 2201 08/19/22 0137 08/19/22 0241 08/19/22 0322  BP:  (!) 148/84 (!) 145/99 (!) 158/91  Pulse:  78 88 84  Resp:  (!) 24 (!) 22 15  Temp:  98 F (36.7 C) 98.2 F (36.8 C)   TempSrc:  Oral Oral   SpO2:  100% 100% 100%  Weight: 79.8 kg     Height: 5\' 1"  (1.549 m)      Physical Exam Vitals and nursing note reviewed.  Constitutional:      General: She is not in acute distress.    Appearance: She is obese.     Comments: Appears anxious and restless  HENT:     Head: Normocephalic and atraumatic.  Cardiovascular:     Rate and Rhythm: Normal rate and regular rhythm.     Heart sounds: Normal heart sounds.  Pulmonary:     Effort: Pulmonary effort is normal.     Breath sounds: Normal breath sounds.  Abdominal:     Palpations: Abdomen is soft.     Tenderness: There is no abdominal tenderness.  Neurological:     Mental Status: Mental status is at baseline.     Labs on Admission: I have personally reviewed following labs and imaging studies  CBC: Recent Labs  Lab 08/18/22 2204  WBC 5.5  HGB 13.7  HCT 41.6  MCV 91.6  PLT 163   Basic Metabolic Panel: Recent Labs  Lab 08/18/22 2204  NA 143  K 3.8  CL 110  CO2 26  GLUCOSE 160*  BUN 22  CREATININE 1.01*  CALCIUM 9.3   GFR: Estimated Creatinine Clearance: 47.5 mL/min (A) (by C-G formula based on SCr of 1.01 mg/dL (H)). Liver Function Tests: Recent Labs  Lab 08/18/22 2204  AST 16  ALT 13  ALKPHOS 51  BILITOT 0.7  PROT 7.0  ALBUMIN 4.4  No results for input(s): "LIPASE", "AMYLASE" in the last 168 hours. No results for input(s): "AMMONIA" in the last 168 hours. Coagulation Profile: No results for input(s): "INR",  "PROTIME" in the last 168 hours. Cardiac Enzymes: No results for input(s): "CKTOTAL", "CKMB", "CKMBINDEX", "TROPONINI" in the last 168 hours. BNP (last 3 results) No results for input(s): "PROBNP" in the last 8760 hours. HbA1C: No results for input(s): "HGBA1C" in the last 72 hours. CBG: Recent Labs  Lab 08/18/22 2205  GLUCAP 149*   Lipid Profile: No results for input(s): "CHOL", "HDL", "LDLCALC", "TRIG", "CHOLHDL", "LDLDIRECT" in the last 72 hours. Thyroid Function Tests: No results for input(s): "TSH", "T4TOTAL", "FREET4", "T3FREE", "THYROIDAB" in the last 72 hours. Anemia Panel: No results for input(s): "VITAMINB12", "FOLATE", "FERRITIN", "TIBC", "IRON", "RETICCTPCT" in the last 72 hours. Urine analysis:    Component Value Date/Time   COLORURINE YELLOW (A) 08/18/2022 2204   APPEARANCEUR CLEAR (A) 08/18/2022 2204   APPEARANCEUR Cloudy (A) 06/29/2020 0950   LABSPEC 1.025 08/18/2022 2204   PHURINE 5.0 08/18/2022 2204   GLUCOSEU >=500 (A) 08/18/2022 2204   HGBUR NEGATIVE 08/18/2022 2204   BILIRUBINUR NEGATIVE 08/18/2022 2204   BILIRUBINUR Negative 06/29/2020 0950   KETONESUR NEGATIVE 08/18/2022 2204   PROTEINUR NEGATIVE 08/18/2022 2204   NITRITE NEGATIVE 08/18/2022 2204   LEUKOCYTESUR NEGATIVE 08/18/2022 2204    Radiological Exams on Admission: CT HEAD WO CONTRAST ( )  Result Date: 08/18/2022 CLINICAL DATA:  Syncope/presyncope, cerebrovascular cause suspected EXAM: CT HEAD WITHOUT CONTRAST TECHNIQUE: Contiguous axial images were obtained from the base of the skull through the vertex without intravenous contrast. RADIATION DOSE REDUCTION: This exam was performed according to the departmental dose-optimization program which includes automated exposure control, adjustment of the mA and/or kV according to patient size and/or use of iterative reconstruction technique. COMPARISON:  11/24/2016 FINDINGS: Brain: Normal anatomic configuration. Parenchymal volume loss is commensurate  with the patient's age. Mild periventricular white matter changes are present likely reflecting the sequela of small vessel ischemia. No abnormal intra or extra-axial mass lesion or fluid collection. No abnormal mass effect or midline shift. No evidence of acute intracranial hemorrhage or infarct. Ventricular size is normal. Cerebellum unremarkable. Vascular: No asymmetric hyperdense vasculature at the skull base. Skull: Intact Sinuses/Orbits: Paranasal sinuses are clear. Orbits are unremarkable. Other: Mastoid air cells and middle ear cavities are clear. IMPRESSION: 1. No acute intracranial hemorrhage or infarct. 2. Mild senescent change. Electronically Signed   By: Helyn Numbers M.D.   On: 08/18/2022 22:41     Data Reviewed: Relevant notes from primary care and specialist visits, past discharge summaries as available in EHR, including Care Everywhere. Prior diagnostic testing as pertinent to current admission diagnoses Updated medications and problem lists for reconciliation ED course, including vitals, labs, imaging, treatment and response to treatment Triage notes, nursing and pharmacy notes and ED provider's notes Notable results as noted in HPI   Assessment and Plan: * Syncope Uncertain etiology, unknown whether medication related CT head negative and UDS negative  Syncope workup  S/P lumbar laminectomy Pain control  Essential (primary) hypertension Continue home meds  Type 2 diabetes mellitus Sliding scale coverage  OSA on CPAP CPAP  RLS (restless legs syndrome) Continue home meds  Anxiety and depression Continue cymbalta     DVT prophylaxis: Lovenox  Consults: none  Advance Care Planning:   Code Status: Prior   Family Communication: Husband at bedside  Disposition Plan: Back to previous home environment  Severity of Illness: The appropriate patient status for this  patient is OBSERVATION. Observation status is judged to be reasonable and necessary in order to  provide the required intensity of service to ensure the patient's safety. The patient's presenting symptoms, physical exam findings, and initial radiographic and laboratory data in the context of their medical condition is felt to place them at decreased risk for further clinical deterioration. Furthermore, it is anticipated that the patient will be medically stable for discharge from the hospital within 2 midnights of admission.   Author: Andris Baumann, MD 08/19/2022 3:31 AM  For on call review [email or web address].

## 2022-08-19 NOTE — Assessment & Plan Note (Signed)
Sliding scale coverage 

## 2022-08-19 NOTE — Assessment & Plan Note (Signed)
Continue cymbalta  

## 2022-08-19 NOTE — ED Notes (Signed)
ED Provider at bedside. 

## 2022-08-19 NOTE — ED Notes (Signed)
Pt requesting medication for chronic sciatica and restless legs. Request sent to admitting provider

## 2022-08-19 NOTE — ED Notes (Signed)
Pt spouse stating "somethings wrong" with patient. Patient assessed and ABC intact, patient lying with eyes closed, whimpering, and moving her legs and shoulders. No emergency present. MD Dolores Frame notified.

## 2022-08-19 NOTE — ED Notes (Signed)
Writer in lobby with pt and pts spouse. Pts left AC clear of documented IV with no bleeding noted. Education provided to pt on risk she is taking, leaving against medical advice. Pt continuing to refuse to stay and signs AMA electronic signature and walks out [facility] door. Pts spouse yelling at pt, stating, "You need to stay. I'm not paying this bill." Spouse advised by writer to call 911 if pts symptoms worsen or any distress noted once home.    Assigned RN updated and informed to contact attending

## 2022-08-21 ENCOUNTER — Ambulatory Visit: Payer: Medicare HMO

## 2022-08-25 ENCOUNTER — Ambulatory Visit: Payer: Medicare HMO

## 2022-09-01 ENCOUNTER — Ambulatory Visit: Payer: Medicare HMO

## 2022-09-01 DIAGNOSIS — M6281 Muscle weakness (generalized): Secondary | ICD-10-CM

## 2022-09-01 DIAGNOSIS — M25611 Stiffness of right shoulder, not elsewhere classified: Secondary | ICD-10-CM

## 2022-09-01 DIAGNOSIS — M25511 Pain in right shoulder: Secondary | ICD-10-CM

## 2022-09-01 NOTE — Therapy (Signed)
OUTPATIENT PHYSICAL THERAPY TREATMENT NOTE   Patient Name: Jessica Cole MRN: [id number] DOB:[birth year], 72 y.o., female Today's Date: 09/01/2022  PCP: Lynnea Ferrier, MD  REFERRING PROVIDER: Anson Oregon, PA-C   END OF SESSION:  PT End of Session - 09/01/22 4098     Visit Number 8    Number of Visits 25    Date for PT Re-Evaluation 10/16/22    Progress Note Due on Visit 10    PT Start Time 0852    PT Stop Time 0931    PT Time Calculation (min) 39 min    Activity Tolerance Patient tolerated treatment well    Behavior During Therapy [facility] for tasks assessed/performed                   Past Medical History:  Diagnosis Date   Anxiety    Asthma    seasonal with allergies   Colon polyps    Degenerative arthritis    Depression    Environmental allergies    Family history of adverse reaction to anesthesia    a.) PONV in 1st degree relative (sister)   Fatty liver    Glaucoma    History of bronchitis    History of kidney stones    History of pneumonia    Hypertension    Hypothyroidism    Migraines    Nose colonized with MRSA 02/14/2021   a.) preoperative PCR (+) on 02/14/2021 prior to LUMBAR MICRODISCECTOMY; b.) preoperative PCR (+) 06/24/2022 prior to REVERSE SHOULDER ARTHROPLASTY W/ BICEPS TENODESIS   Obesity    OSA on CPAP    Plantar fasciitis    right   Pneumonia    Renal calculi    Rheumatoid arthritis (HCC)    RLS (restless legs syndrome) 08/23/2014   T2DM (type 2 diabetes mellitus) (HCC)    Past Surgical History:  Procedure Laterality Date   ABDOMINAL HYSTERECTOMY     partial   APPENDECTOMY     Arthroscopic surgery, knee Left    BREAST BIOPSY Right    several   BREAST BIOPSY Right 03/11/2017   u/s bx neg   BUNIONECTOMY     LEFT    COLONOSCOPY W/ POLYPECTOMY     COLONOSCOPY WITH PROPOFOL N/A 08/14/2017   Procedure: COLONOSCOPY WITH PROPOFOL;  Surgeon: Christena Deem, MD;  Location: [facility] ENDOSCOPY;  Service: Endoscopy;   Laterality: N/A;   EXTRACORPOREAL SHOCK WAVE LITHOTRIPSY Left 05/14/2017   Procedure: EXTRACORPOREAL SHOCK WAVE LITHOTRIPSY (ESWL);  Surgeon: Riki Altes, MD;  Location: [facility] ORS;  Service: Urology;  Laterality: Left;   EXTRACORPOREAL SHOCK WAVE LITHOTRIPSY Left 06/14/2020   Procedure: EXTRACORPOREAL SHOCK WAVE LITHOTRIPSY (ESWL);  Surgeon: Sondra Come, MD;  Location: [facility] ORS;  Service: Urology;  Laterality: Left;   FOOT SURGERY     RIGHT     KNEE ARTHROSCOPY W/ MENISCAL REPAIR Left    LASIK     LUMBAR LAMINECTOMY/DECOMPRESSION MICRODISCECTOMY Right 03/01/2021   Procedure: Microdiscectomy - Lumbar five-Sacral one - right;  Surgeon: Tia Alert, MD;  Location: [facility] OR;  Service: Neurosurgery;  Laterality: Right;   MAXIMUM ACCESS (MAS)POSTERIOR LUMBAR INTERBODY FUSION (PLIF) 1 LEVEL N/A 04/25/2016   Procedure: LUMBAR FOUR-FIVE  MAXIMUM ACCESS (MAS) POSTERIOR LUMBAR INTERBODY FUSION (PLIF) with extension of instrumentation LUMBAR TWO-FIVE;  Surgeon: Tia Alert, MD;  Location: [facility] OR;  Service: Neurosurgery;  Laterality: N/A;   MAXIMUM ACCESS (MAS)POSTERIOR LUMBAR INTERBODY FUSION (PLIF) 2 LEVEL N/A 12/13/2015   Procedure:  Lumbar two-three - Lumbar three-four MAXIMUM ACCESS (MAS) POSTERIOR LUMBAR INTERBODY FUSION (PLIF)  ;  Surgeon: Tia Alert, MD;  Location: [facility] NEURO ORS;  Service: Neurosurgery;  Laterality: N/A;   REVERSE SHOULDER ARTHROPLASTY Left 03/01/2020   Procedure: REVERSE SHOULDER ARTHROPLASTY;  Surgeon: Christena Flake, MD;  Location: [facility] ORS;  Service: Orthopedics;  Laterality: Left;   REVERSE SHOULDER ARTHROPLASTY Right 07/03/2022   Procedure: REVERSE SHOULDER ARTHROPLASTY W/ BICEPS TENODESIS.;  Surgeon: Christena Flake, MD;  Location: [facility] ORS;  Service: Orthopedics;  Laterality: Right;   SHOULDER SURGERY     RIGHT    TONSILLECTOMY     Patient Active Problem List   Diagnosis Date Noted   Syncope 08/19/2022   S/P lumbar laminectomy 03/01/2021   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) 01/03/2019   Chronic cough 11/19/2016   Morbid obesity (HCC) 09/26/2016   Left ovarian cyst 05/30/2016   S/P lumbar spinal fusion 12/13/2015   Aortic calcification (HCC) 07/18/2015   Controlled type 2 diabetes mellitus without complication (HCC) 05/16/2015   Thrombocytopenia (HCC) 05/16/2015   Recurrent major depressive disorder, in full remission (HCC) 05/16/2015   Essential (primary) hypertension 05/16/2015   Fatty infiltration of liver 03/15/2015   Aortic valve stenosis, nonrheumatic 01/18/2015   Sciatica of right side 01/09/2015   Type 2 diabetes mellitus (HCC) 11/10/2014   RLS (restless legs syndrome) 08/23/2014   Neuritis or radiculitis due to rupture of lumbar intervertebral disc 06/13/2014   Degeneration of intervertebral disc of lumbar region 06/13/2014   Lumbar radiculitis 06/13/2014   Arthritis 01/23/2014   Arthritis of knee, degenerative 11/15/2013   Anxiety and depression 09/29/2013   Insomnia 09/29/2013   OSA on CPAP 08/29/2013   History of nephrolithiasis 08/29/2013   Abnormal presence of protein in urine 08/29/2013   Headache, migraine 08/29/2013   BP (high blood pressure) 08/29/2013   HLD (hyperlipidemia) 08/29/2013   Allergic rhinitis 08/29/2013   Intractable migraine without aura 02/18/2013    REFERRING DIAG: Reverse right total shoulder arthroplasty with biceps tenodesis.    THERAPY DIAG:  Right shoulder pain, unspecified chronicity  Stiffness of right shoulder, not elsewhere classified  Muscle weakness (generalized)  Rationale for Evaluation and Treatment Rehabilitation  PERTINENT HISTORY: S/P R reverse total shoulder replacement on 07/03/2022. Feels a little bit aggravated currently after taking a shower but usually goes away. Has had a couple of PT visits for home health. Exercises included pendulums and PROM.   PRECAUTIONS: PROM during initial  weeks, no reaching behind back  Per BERT Daisy Blossom III, MD:  It should be safe for her to resume physical therapy on her shoulder, however.    SUBJECTIVE:   SUBJECTIVE STATEMENT: R shoulder is feeling Good . No pain currently.  PAIN:  Are you having pain? See subjective   TODAY'S TREATMENT:  DATE: 09/01/2022  Therapeutic exercises  Currently following the [facility] Reverse Total Shoulder Replacement Protocol   Supine R shoulder AAROM with L UE assist   Flexion 10x3    Supine R shoulder AAROM with PT   Scaption 10x3  Abduction 10x3   At scapular plane: ER: 10x3   Seated B scapular retraction 10x5 seconds for 3 sets  Seated chin tucks to promote upper thoracic extension 10x5 seconds for 2 sets  Seated R shoulder  AAROM with SPC, tip on ground to simulate UE ranger.   Flexion 10x2  Scaption 10x2 with PT assist     Improved exercise technique, movement at target joints, use of target muscles after mod verbal, visual, tactile cues.   Manual therapy  Seated STM B cervical paraspinal and upper trap muscles to decrease tension to neck and R shoulder.        Response to treatment Pt tolerated session well without aggravation of shoulder symptoms.   Clinical impression  Pt currently 8-9 weeks post op today. Worked on AAROM to decrease stiffness while gradually improving ability to raise her arm up with less difficulty. Continued working on scapular muscle activation to promote proper mechanics when able to actively raise her arm.  Pt will benefit from continued skilled physical therapy services to improve ROM, strength, and function.        PATIENT EDUCATION: Education details: there-ex Person educated: Patient Education method: Explanation and Verbal cues Education comprehension: verbalized understanding and  returned demonstration  HOME EXERCISE PROGRAM: Access Code: [email or web address] URL: [email or web address] Date: 08/04/2022 Prepared by: Loralyn Freshwater  Exercises - Supine Shoulder Flexion AAROM with Hands Clasped  - 1 x daily - 7 x weekly - 3 sets - 10 reps      PT Short Term Goals - 07/22/22 1032       PT SHORT TERM GOAL #1   Title Patient will be independent with her initial HEP to improve ROM and R UE function.    Time 3    Period Weeks    Status New    Target Date 08/14/22              PT Long Term Goals - 07/22/22 1033       PT LONG TERM GOAL #1   Title Pt will have a leat 120 degrees R shoulder flexion and abduction A/AROM to promote ability to reach and use her R UE for functional tasks.    Baseline R shoulder PROM 95 degrees flexion, 72 degrees abduction (07/22/2022)    Time 12    Period Weeks    Status New    Target Date 10/16/22      PT LONG TERM GOAL #2   Title Pt will improve R shoulder ER A/AROM to at least 60 degrees to promote ability to reach behind her head.    Baseline R shoulder ER PROM in scapular plane: 33 degrees (07/22/2022)    Time 12    Period Weeks    Status New    Target Date 10/16/22      PT LONG TERM GOAL #3   Title Pt will have at least 4/5 R shoulder strength at all planes to promote ability to use her R UE for functional tasks.    Baseline R shoulder strength not tested yet to allow for more healing (07/22/2022)    Time 12    Period Weeks    Status New    Target Date 10/16/22  PT LONG TERM GOAL #4   Title Pt will improve R shoulder FOTO score to at least 50 as a demonstration of improved function.    Baseline R shoulder FOTO score 34 (07/22/2022)    Time 12    Period Weeks    Status New    Target Date 10/16/22              Plan - 09/01/22 0844     Clinical Impression Statement Pt currently 8-9 weeks post op today. Worked on AAROM to decrease stiffness while gradually improving ability to raise her arm up with  less difficulty. Continued working on scapular muscle activation to promote proper mechanics when able to actively raise her arm.  Pt will benefit from continued skilled physical therapy services to improve ROM, strength, and function.    Personal Factors and Comorbidities Comorbidity 3+;Age;Past/Current Experience;Time since onset of injury/illness/exacerbation;Fitness    Comorbidities Anxiety, depression, HTN, DM, arthritis    Examination-Activity Limitations Bathing;Carry;Lift;Bed Mobility;Toileting;Dressing;Reach Overhead;Caring for Others;Self Feeding;Hygiene/Grooming    Stability/Clinical Decision Making Stable/Uncomplicated    Rehab Potential Fair    PT Frequency 2x / week    PT Duration 12 weeks    PT Treatment/Interventions Therapeutic activities;Therapeutic exercise;Neuromuscular re-education;Patient/family education;Manual techniques;Aquatic Therapy;Electrical Stimulation    PT Next Visit Plan PROM, manual techniques, modalities PRN    Consulted and Agree with Plan of Care Patient              Loralyn Freshwater PT, DPT  09/01/2022, 1:24 PM

## 2022-09-08 ENCOUNTER — Other Ambulatory Visit: Payer: Self-pay | Admitting: Internal Medicine

## 2022-09-08 DIAGNOSIS — M5417 Radiculopathy, lumbosacral region: Secondary | ICD-10-CM

## 2022-09-10 ENCOUNTER — Ambulatory Visit: Payer: Medicare HMO | Attending: Student

## 2022-09-10 DIAGNOSIS — M6281 Muscle weakness (generalized): Secondary | ICD-10-CM | POA: Diagnosis present

## 2022-09-10 DIAGNOSIS — M25511 Pain in right shoulder: Secondary | ICD-10-CM | POA: Diagnosis present

## 2022-09-10 DIAGNOSIS — M25611 Stiffness of right shoulder, not elsewhere classified: Secondary | ICD-10-CM | POA: Insufficient documentation

## 2022-09-10 NOTE — Therapy (Signed)
OUTPATIENT PHYSICAL THERAPY TREATMENT NOTE   Patient Name: Jessica Cole MRN: [id number] DOB:[birth year], 72 y.o., female Today's Date: 09/10/2022  PCP: Lynnea Ferrier, MD  REFERRING PROVIDER: Anson [place], PA-C   END OF SESSION:  PT End of Session - 09/10/22 1306     Visit Number 9    Number of Visits 25    Date for PT Re-Evaluation 10/16/22    Progress Note Due on Visit 10    PT Start Time 1306    PT Stop Time 1344    PT Time Calculation (min) 38 min    Activity Tolerance Patient tolerated treatment well    Behavior During Therapy WFL for tasks assessed/performed                    Past Medical History:  Diagnosis Date   Anxiety    Asthma    seasonal with allergies   Colon polyps    Degenerative arthritis    Depression    Environmental allergies    Family history of adverse reaction to anesthesia    a.) PONV in 1st degree relative (sister)   Fatty liver    Glaucoma    History of bronchitis    History of kidney stones    History of pneumonia    Hypertension    Hypothyroidism    Migraines    Nose colonized with MRSA 02/14/2021   a.) preoperative PCR (+) on 02/14/2021 prior to LUMBAR MICRODISCECTOMY; b.) preoperative PCR (+) 06/24/2022 prior to REVERSE SHOULDER ARTHROPLASTY W/ BICEPS TENODESIS   Obesity    OSA on CPAP    Plantar fasciitis    right   Pneumonia    Renal calculi    Rheumatoid arthritis (HCC)    RLS (restless legs syndrome) 08/23/2014   T2DM (type 2 diabetes mellitus) (HCC)    Past Surgical History:  Procedure Laterality Date   ABDOMINAL HYSTERECTOMY     partial   APPENDECTOMY     Arthroscopic surgery, knee Left    BREAST BIOPSY Right    several   BREAST BIOPSY Right 03/11/2017   u/s bx neg   BUNIONECTOMY     LEFT    COLONOSCOPY W/ POLYPECTOMY     COLONOSCOPY WITH PROPOFOL N/A 08/14/2017   Procedure: COLONOSCOPY WITH PROPOFOL;  Surgeon: Christena Deem, MD;  Location: [facility] ENDOSCOPY;  Service: Endoscopy;   Laterality: N/A;   EXTRACORPOREAL SHOCK WAVE LITHOTRIPSY Left 05/14/2017   Procedure: EXTRACORPOREAL SHOCK WAVE LITHOTRIPSY (ESWL);  Surgeon: Riki Altes, MD;  Location: [facility] ORS;  Service: Urology;  Laterality: Left;   EXTRACORPOREAL SHOCK WAVE LITHOTRIPSY Left 06/14/2020   Procedure: EXTRACORPOREAL SHOCK WAVE LITHOTRIPSY (ESWL);  Surgeon: Sondra Come, MD;  Location: [facility] ORS;  Service: Urology;  Laterality: Left;   FOOT SURGERY     RIGHT     KNEE ARTHROSCOPY W/ MENISCAL REPAIR Left    LASIK     LUMBAR LAMINECTOMY/DECOMPRESSION MICRODISCECTOMY Right 03/01/2021   Procedure: Microdiscectomy - Lumbar five-Sacral one - right;  Surgeon: Tia Alert, MD;  Location: [facility] OR;  Service: Neurosurgery;  Laterality: Right;   MAXIMUM ACCESS (MAS)POSTERIOR LUMBAR INTERBODY FUSION (PLIF) 1 LEVEL N/A 04/25/2016   Procedure: LUMBAR FOUR-FIVE  MAXIMUM ACCESS (MAS) POSTERIOR LUMBAR INTERBODY FUSION (PLIF) with extension of instrumentation LUMBAR TWO-FIVE;  Surgeon: Tia Alert, MD;  Location: [facility] OR;  Service: Neurosurgery;  Laterality: N/A;   MAXIMUM ACCESS (MAS)POSTERIOR LUMBAR INTERBODY FUSION (PLIF) 2 LEVEL N/A 12/13/2015  Procedure: Lumbar two-three - Lumbar three-four MAXIMUM ACCESS (MAS) POSTERIOR LUMBAR INTERBODY FUSION (PLIF)  ;  Surgeon: Tia Alert, MD;  Location: [facility] NEURO ORS;  Service: Neurosurgery;  Laterality: N/A;   REVERSE SHOULDER ARTHROPLASTY Left 03/01/2020   Procedure: REVERSE SHOULDER ARTHROPLASTY;  Surgeon: Christena Flake, MD;  Location: [facility] ORS;  Service: Orthopedics;  Laterality: Left;   REVERSE SHOULDER ARTHROPLASTY Right 07/03/2022   Procedure: REVERSE SHOULDER ARTHROPLASTY W/ BICEPS TENODESIS.;  Surgeon: Christena Flake, MD;  Location: [facility] ORS;  Service: Orthopedics;  Laterality: Right;   SHOULDER SURGERY     RIGHT    TONSILLECTOMY     Patient Active Problem List   Diagnosis Date Noted   Syncope 08/19/2022   S/P lumbar laminectomy 03/01/2021   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) 01/03/2019   Chronic cough 11/19/2016   Morbid obesity (HCC) 09/26/2016   Left ovarian cyst 05/30/2016   S/P lumbar spinal fusion 12/13/2015   Aortic calcification (HCC) 07/18/2015   Controlled type 2 diabetes mellitus without complication (HCC) 05/16/2015   Thrombocytopenia (HCC) 05/16/2015   Recurrent major depressive disorder, in full remission (HCC) 05/16/2015   Essential (primary) hypertension 05/16/2015   Fatty infiltration of liver 03/15/2015   Aortic valve stenosis, nonrheumatic 01/18/2015   Sciatica of right side 01/09/2015   Type 2 diabetes mellitus (HCC) 11/10/2014   RLS (restless legs syndrome) 08/23/2014   Neuritis or radiculitis due to rupture of lumbar intervertebral disc 06/13/2014   Degeneration of intervertebral disc of lumbar region 06/13/2014   Lumbar radiculitis 06/13/2014   Arthritis 01/23/2014   Arthritis of knee, degenerative 11/15/2013   Anxiety and depression 09/29/2013   Insomnia 09/29/2013   OSA on CPAP 08/29/2013   History of nephrolithiasis 08/29/2013   Abnormal presence of protein in urine 08/29/2013   Headache, migraine 08/29/2013   BP (high blood pressure) 08/29/2013   HLD (hyperlipidemia) 08/29/2013   Allergic rhinitis 08/29/2013   Intractable migraine without aura 02/18/2013    REFERRING DIAG: Reverse right total shoulder arthroplasty with biceps tenodesis.    THERAPY DIAG:  Right shoulder pain, unspecified chronicity  Stiffness of right shoulder, not elsewhere classified  Muscle weakness (generalized)  Rationale for Evaluation and Treatment Rehabilitation  PERTINENT HISTORY: S/P R reverse total shoulder replacement on 07/03/2022. Feels a little bit aggravated currently after taking a shower but usually goes away. Has had a couple of PT visits for home health. Exercises included pendulums and PROM.   PRECAUTIONS: PROM during initial  weeks, no reaching behind back  Per BERT Daisy Blossom III, MD:  It should be safe for her to resume physical therapy on her shoulder, however.    SUBJECTIVE:   SUBJECTIVE STATEMENT: R shoulder is not bad, no R shoulder pain. Has restless legs    PAIN:  Are you having pain? See subjective   TODAY'S TREATMENT:  DATE: 09/10/2022  Therapeutic exercises  Currently following the [facility] Reverse Total Shoulder Replacement Protocol  Seated R shoulder AAROM with Pulley  Flexion 10x3  Scaption 10x3  Abduction 10x3  Seated B scapular retraction 10x5 seconds for 2 sets  Supine R shoulder AROM starting with elbow bent  Flexion 10x2  Scaption 10x2    No difficulty observed  Reclined R shoulder AROM starting with elbow bent.   Flexion 10x2  Scaption 10x2   Supine gentle rhythmic stabilization with shoulder at 90 degrees flexion 30 seconds x 3   Standing R shoulder AAROM    Pillow case slides up stair rail    Flexion 10x3   Scaption 10x3    seated B shoulder scapular retraction and ER 10x  Seated R shoulder ER AAROM with PVC rod 10x2     Improved exercise technique, movement at target joints, use of target muscles after mod verbal, visual, tactile cues.      Response to treatment Pt tolerated session well without aggravation of shoulder symptoms.   Clinical impression   Pt currently 9-10 weeks post op. Worked on A/AROM to decrease stiffness while gradually improving ability to raise her arm up with less difficulty. Pt tolerated session well without aggravation of symptoms. Pt will benefit from continued skilled physical therapy services to improve ROM, strength, and function.        PATIENT EDUCATION: Education details: there-ex Person educated: Patient Education method: Explanation and Verbal cues Education  comprehension: verbalized understanding and returned demonstration  HOME EXERCISE PROGRAM: Access Code: [email or web address] URL: [email or web address] Date: 08/04/2022 Prepared by: Loralyn Freshwater  Exercises - Supine Shoulder Flexion AAROM with Hands Clasped  - 1 x daily - 7 x weekly - 3 sets - 10 reps  - Seated Shoulder Flexion AAROM with Pulley Behind  - 3 x daily - 7 x weekly - 3 sets - 10 reps - Seated Shoulder Abduction AAROM with Pulley Behind  - 3 x daily - 7 x weekly - 3 sets - 10 reps    PT Short Term Goals - 07/22/22 1032       PT SHORT TERM GOAL #1   Title Patient will be independent with her initial HEP to improve ROM and R UE function.    Time 3    Period Weeks    Status New    Target Date 08/14/22              PT Long Term Goals - 07/22/22 1033       PT LONG TERM GOAL #1   Title Pt will have a leat 120 degrees R shoulder flexion and abduction A/AROM to promote ability to reach and use her R UE for functional tasks.    Baseline R shoulder PROM 95 degrees flexion, 72 degrees abduction (07/22/2022)    Time 12    Period Weeks    Status New    Target Date 10/16/22      PT LONG TERM GOAL #2   Title Pt will improve R shoulder ER A/AROM to at least 60 degrees to promote ability to reach behind her head.    Baseline R shoulder ER PROM in scapular plane: 33 degrees (07/22/2022)    Time 12    Period Weeks    Status New    Target Date 10/16/22      PT LONG TERM GOAL #3   Title Pt will have at least 4/5 R shoulder strength at all planes to promote ability to use  her R UE for functional tasks.    Baseline R shoulder strength not tested yet to allow for more healing (07/22/2022)    Time 12    Period Weeks    Status New    Target Date 10/16/22      PT LONG TERM GOAL #4   Title Pt will improve R shoulder FOTO score to at least 50 as a demonstration of improved function.    Baseline R shoulder FOTO score 34 (07/22/2022)    Time 12    Period Weeks    Status New     Target Date 10/16/22              Plan - 09/10/22 1304     Clinical Impression Statement Pt currently 9-10 weeks post op. Worked on A/AROM to decrease stiffness while gradually improving ability to raise her arm up with less difficulty. Pt tolerated session well without aggravation of symptoms. Pt will benefit from continued skilled physical therapy services to improve ROM, strength, and function.    Personal Factors and Comorbidities Comorbidity 3+;Age;Past/Current Experience;Time since onset of injury/illness/exacerbation;Fitness    Comorbidities Anxiety, depression, HTN, DM, arthritis    Examination-Activity Limitations Bathing;Carry;Lift;Bed Mobility;Toileting;Dressing;Reach Overhead;Caring for Others;Self Feeding;Hygiene/Grooming    Stability/Clinical Decision Making Stable/Uncomplicated    Clinical Decision Making Low    Rehab Potential Fair    PT Frequency 2x / week    PT Duration 12 weeks    PT Treatment/Interventions Therapeutic activities;Therapeutic exercise;Neuromuscular re-education;Patient/family education;Manual techniques;Aquatic Therapy;Electrical Stimulation    PT Next Visit Plan PROM, manual techniques, modalities PRN    Consulted and Agree with Plan of Care Patient              Loralyn Freshwater PT, DPT  09/10/2022, 3:26 PM

## 2022-09-15 ENCOUNTER — Observation Stay (HOSPITAL_BASED_OUTPATIENT_CLINIC_OR_DEPARTMENT_OTHER)
Admit: 2022-09-15 | Discharge: 2022-09-15 | Disposition: A | Discharge status: Home / Self Care | Payer: Medicare HMO | Attending: Family Medicine | Admitting: Family Medicine

## 2022-09-15 ENCOUNTER — Emergency Department: Payer: Medicare HMO

## 2022-09-15 ENCOUNTER — Observation Stay
Admission: EM | Admit: 2022-09-15 | Discharge: 2022-09-16 | Disposition: A | Discharge status: Home / Self Care | Payer: Medicare HMO | Attending: Internal Medicine | Admitting: Internal Medicine

## 2022-09-15 ENCOUNTER — Ambulatory Visit: Payer: Medicare HMO

## 2022-09-15 ENCOUNTER — Other Ambulatory Visit: Payer: Self-pay

## 2022-09-15 DIAGNOSIS — E039 Hypothyroidism, unspecified: Secondary | ICD-10-CM | POA: Diagnosis not present

## 2022-09-15 DIAGNOSIS — I517 Cardiomegaly: Secondary | ICD-10-CM

## 2022-09-15 DIAGNOSIS — M069 Rheumatoid arthritis, unspecified: Secondary | ICD-10-CM | POA: Diagnosis not present

## 2022-09-15 DIAGNOSIS — E872 Acidosis, unspecified: Secondary | ICD-10-CM | POA: Insufficient documentation

## 2022-09-15 DIAGNOSIS — R531 Weakness: Principal | ICD-10-CM | POA: Insufficient documentation

## 2022-09-15 DIAGNOSIS — I9589 Other hypotension: Secondary | ICD-10-CM | POA: Diagnosis not present

## 2022-09-15 DIAGNOSIS — E119 Type 2 diabetes mellitus without complications: Secondary | ICD-10-CM

## 2022-09-15 DIAGNOSIS — E785 Hyperlipidemia, unspecified: Secondary | ICD-10-CM | POA: Diagnosis present

## 2022-09-15 DIAGNOSIS — I493 Ventricular premature depolarization: Secondary | ICD-10-CM | POA: Diagnosis not present

## 2022-09-15 DIAGNOSIS — Z79899 Other long term (current) drug therapy: Secondary | ICD-10-CM | POA: Diagnosis not present

## 2022-09-15 DIAGNOSIS — M5416 Radiculopathy, lumbar region: Secondary | ICD-10-CM | POA: Diagnosis not present

## 2022-09-15 DIAGNOSIS — E1169 Type 2 diabetes mellitus with other specified complication: Secondary | ICD-10-CM

## 2022-09-15 DIAGNOSIS — Z96611 Presence of right artificial shoulder joint: Secondary | ICD-10-CM | POA: Insufficient documentation

## 2022-09-15 DIAGNOSIS — I10 Essential (primary) hypertension: Secondary | ICD-10-CM | POA: Diagnosis not present

## 2022-09-15 DIAGNOSIS — I959 Hypotension, unspecified: Secondary | ICD-10-CM | POA: Diagnosis present

## 2022-09-15 DIAGNOSIS — Z7982 Long term (current) use of aspirin: Secondary | ICD-10-CM | POA: Insufficient documentation

## 2022-09-15 DIAGNOSIS — G8929 Other chronic pain: Secondary | ICD-10-CM

## 2022-09-15 DIAGNOSIS — J45909 Unspecified asthma, uncomplicated: Secondary | ICD-10-CM | POA: Insufficient documentation

## 2022-09-15 DIAGNOSIS — Z96612 Presence of left artificial shoulder joint: Secondary | ICD-10-CM | POA: Diagnosis not present

## 2022-09-15 DIAGNOSIS — E669 Obesity, unspecified: Secondary | ICD-10-CM

## 2022-09-15 DIAGNOSIS — Z7984 Long term (current) use of oral hypoglycemic drugs: Secondary | ICD-10-CM | POA: Insufficient documentation

## 2022-09-15 LAB — CBC WITH DIFFERENTIAL/PLATELET
Abs Immature Granulocytes: 0.02 10*3/uL (ref 0.00–0.07)
Basophils Absolute: 0 10*3/uL (ref 0.0–0.1)
Basophils Relative: 1 %
Eosinophils Absolute: 0.1 10*3/uL (ref 0.0–0.5)
Eosinophils Relative: 2 %
HCT: 39.8 % (ref 36.0–46.0)
Hemoglobin: 13.2 g/dL (ref 12.0–15.0)
Immature Granulocytes: 0 %
Lymphocytes Relative: 22 %
Lymphs Abs: 1.1 10*3/uL (ref 0.7–4.0)
MCH: 29.9 pg (ref 26.0–34.0)
MCHC: 33.2 g/dL (ref 30.0–36.0)
MCV: 90.2 fL (ref 80.0–100.0)
Monocytes Absolute: 0.5 10*3/uL (ref 0.1–1.0)
Monocytes Relative: 10 %
Neutro Abs: 3.2 10*3/uL (ref 1.7–7.7)
Neutrophils Relative %: 65 %
Platelets: 150 10*3/uL (ref 150–400)
RBC: 4.41 MIL/uL (ref 3.87–5.11)
RDW: 13.1 % (ref 11.5–15.5)
WBC: 4.9 10*3/uL (ref 4.0–10.5)
nRBC: 0 % (ref 0.0–0.2)

## 2022-09-15 LAB — COMPREHENSIVE METABOLIC PANEL
ALT: 16 U/L (ref 0–44)
AST: 19 U/L (ref 15–41)
Albumin: 4.6 g/dL (ref 3.5–5.0)
Alkaline Phosphatase: 47 U/L (ref 38–126)
Anion gap: 9 (ref 5–15)
BUN: 32 mg/dL — ABNORMAL HIGH (ref 8–23)
CO2: 24 mmol/L (ref 22–32)
Calcium: 9 mg/dL (ref 8.9–10.3)
Chloride: 103 mmol/L (ref 98–111)
Creatinine, Ser: 0.78 mg/dL (ref 0.44–1.00)
GFR, Estimated: >60 mL/min (ref >60)
Glucose, Bld: 124 mg/dL — ABNORMAL HIGH (ref 70–99)
Potassium: 4.2 mmol/L (ref 3.5–5.1)
Sodium: 136 mmol/L (ref 135–145)
Total Bilirubin: 0.7 mg/dL (ref 0.3–1.2)
Total Protein: 6.8 g/dL (ref 6.5–8.1)

## 2022-09-15 LAB — URINALYSIS, W/ REFLEX TO CULTURE (INFECTION SUSPECTED)
Bacteria, UA: NONE SEEN
Bilirubin Urine: NEGATIVE
Glucose, UA: NEGATIVE mg/dL
Hgb urine dipstick: NEGATIVE
Nitrite: NEGATIVE
Protein, ur: NEGATIVE mg/dL
RBC / HPF: NONE SEEN RBC/hpf (ref 0–5)
Specific Gravity, Urine: 1.02 (ref 1.005–1.030)
pH: 5.5 (ref 5.0–8.0)

## 2022-09-15 LAB — CBG MONITORING, ED: Glucose-Capillary: 112 mg/dL — ABNORMAL HIGH (ref 70–99)

## 2022-09-15 LAB — URINE DRUG SCREEN, QUALITATIVE (ARMC ONLY)
Amphetamines, Ur Screen: NOT DETECTED
Barbiturates, Ur Screen: NOT DETECTED
Benzodiazepine, Ur Scrn: NOT DETECTED
Cannabinoid 50 Ng, Ur ~~LOC~~: NOT DETECTED
Cocaine Metabolite,Ur ~~LOC~~: NOT DETECTED
MDMA (Ecstasy)Ur Screen: NOT DETECTED
Methadone Scn, Ur: NOT DETECTED
Opiate, Ur Screen: POSITIVE — AB
Phencyclidine (PCP) Ur S: NOT DETECTED
Tricyclic, Ur Screen: NOT DETECTED

## 2022-09-15 LAB — LACTIC ACID, PLASMA
Lactic Acid, Venous: 1.1 mmol/L (ref 0.5–1.9)
Lactic Acid, Venous: 3.2 mmol/L (ref 0.5–1.9)
Lactic Acid, Venous: 0.8 mmol/L (ref 0.5–1.9)

## 2022-09-15 LAB — CK: Total CK: 41 U/L (ref 38–234)

## 2022-09-15 LAB — PROTIME-INR
INR: 1.1 (ref 0.8–1.2)
Prothrombin Time: 14.1 seconds (ref 11.4–15.2)

## 2022-09-15 LAB — APTT: aPTT: 26 seconds (ref 24–36)

## 2022-09-15 MED ORDER — SODIUM CHLORIDE 0.9 % IV SOLN: INTRAVENOUS | Status: DC

## 2022-09-15 MED ORDER — LACTATED RINGERS IV BOLUS (SEPSIS)
1000.0000 mL | Freq: Once | INTRAVENOUS | Status: AC
  Administered 2022-09-15: 1000 mL via INTRAVENOUS

## 2022-09-15 MED ORDER — METRONIDAZOLE 500 MG/100ML IV SOLN
500.0000 mg | Freq: Once | INTRAVENOUS | Status: AC
  Administered 2022-09-15: 500 mg via INTRAVENOUS
  Filled 2022-09-15: qty 100

## 2022-09-15 MED ORDER — VANCOMYCIN HCL IN DEXTROSE 1-5 GM/200ML-% IV SOLN
1000.0000 mg | Freq: Once | INTRAVENOUS | Status: AC
  Administered 2022-09-15: 1000 mg via INTRAVENOUS
  Filled 2022-09-15: qty 200

## 2022-09-15 MED ORDER — SODIUM CHLORIDE 0.9 % IV SOLN
2.0000 g | Freq: Once | INTRAVENOUS | Status: AC
  Administered 2022-09-15: 2 g via INTRAVENOUS
  Filled 2022-09-15: qty 12.5

## 2022-09-15 MED ORDER — ONDANSETRON HCL 4 MG PO TABS: 4.0000 mg | ORAL_TABLET | Freq: Four times a day (QID) | ORAL | Status: DC | PRN

## 2022-09-15 MED ORDER — ONDANSETRON HCL 4 MG/2ML IJ SOLN: 4.0000 mg | Freq: Four times a day (QID) | INTRAMUSCULAR | Status: DC | PRN

## 2022-09-15 MED ORDER — SODIUM CHLORIDE 0.9 % IV BOLUS (SEPSIS)
500.0000 mL | Freq: Once | INTRAVENOUS | Status: AC
  Administered 2022-09-15: 500 mL via INTRAVENOUS

## 2022-09-15 MED ORDER — ACETAMINOPHEN 325 MG PO TABS: 650.0000 mg | ORAL_TABLET | Freq: Four times a day (QID) | ORAL | Status: DC | PRN

## 2022-09-15 MED ORDER — OXYCODONE HCL 5 MG PO TABS: 2.5000 mg | ORAL_TABLET | Freq: Once | ORAL | Status: DC

## 2022-09-15 MED ORDER — ACETAMINOPHEN 325 MG PO TABS
650.0000 mg | ORAL_TABLET | Freq: Once | ORAL | Status: AC
  Administered 2022-09-15: 650 mg via ORAL
  Filled 2022-09-15: qty 2

## 2022-09-15 MED ORDER — ENOXAPARIN SODIUM 40 MG/0.4ML IJ SOSY
40.0000 mg | PREFILLED_SYRINGE | INTRAMUSCULAR | Status: DC
  Administered 2022-09-15: 40 mg via SUBCUTANEOUS
  Filled 2022-09-15: qty 0.4

## 2022-09-15 NOTE — Assessment & Plan Note (Signed)
Hold pain meds and adjunctive medications in setting of hypotension

## 2022-09-15 NOTE — Assessment & Plan Note (Signed)
SSI A1c 

## 2022-09-15 NOTE — Assessment & Plan Note (Signed)
Limit of normal blood pressures on presentation Hold BP regimen for now Follow

## 2022-09-15 NOTE — ED Notes (Signed)
Ray, MD, made aware of lactic 3.2

## 2022-09-15 NOTE — Assessment & Plan Note (Addendum)
Patient with transient hypotension at home in setting of multiple medications including Micardis, oxycodone, gabapentin as well as a new nitric oxide regimen at home Nonfocal neuroexam No reported syncope Will hold offending medications Minimal IV fluids Orthostatics overnight The echo in setting of cardiomegaly and vascular congestion on imaging Monitor

## 2022-09-15 NOTE — Assessment & Plan Note (Signed)
Continue statin pending CK level

## 2022-09-15 NOTE — ED Provider Notes (Signed)
[facility] Provider Note    Event Date/Time   First MD Initiated Contact with Patient 09/15/22 1015     (approximate)   History   Hypotension   HPI  Jessica Cole is a 72 y.o. female presenting to the emergency department for evaluation of weakness.  Patient reports that she has been feeling weak over the past few days.  EMS was called and she was found to have an initial blood pressure of 84/58.  Given a small fluid bolus with improvement to 110/68.  No chest pain or shortness of breath.  No sick contacts, cough, dysuria.  Initially quite somnolent.  She does report that she has been having ongoing back pain for which she has been taking oxycodone.  Also think she takes blood pressure medication, unsure of the name.     Physical Exam   Triage Vital Signs: ED Triage Vitals  Enc Vitals Group     BP 09/15/22 1020 112/69     Pulse Rate 09/15/22 1020 (!) 57     Resp 09/15/22 1020 18     Temp 09/15/22 1020 97.8 F (36.6 C)     Temp src --      SpO2 09/15/22 1020 96 %     Weight --      Height --      Head Circumference --      Peak Flow --      Pain Score 09/15/22 1022 0     Pain Loc --      Pain Edu? --      Excl. in GC? --     Most recent vital signs: Vitals:   09/15/22 1330 09/15/22 1430  BP: 125/73 129/82  Pulse: 68 63  Resp: 16 16  Temp:    SpO2: 95% 97%     General: Awake, arousable but somnolent CV:  Regular rate, good peripheral perfusion.  Resp:  Lungs clear, unlabored respirations.  Abd:  Soft, nondistended.  Neuro:  Alert, answers basic questions appropriately,, normal extraocular movements, symmetric facial movement, 5-5 strength in bilateral upper and lower extremities.  ED Results / Procedures / Treatments   Labs (all labs ordered are listed, but only abnormal results are displayed) Labs Reviewed  LACTIC ACID, PLASMA - Abnormal; Notable for the following components:      Result Value   Lactic Acid, Venous 3.2 (*)     All other components within normal limits  COMPREHENSIVE METABOLIC PANEL - Abnormal; Notable for the following components:   Glucose, Bld 124 (*)    BUN 32 (*)    All other components within normal limits  URINALYSIS, W/ REFLEX TO CULTURE (INFECTION SUSPECTED) - Abnormal; Notable for the following components:   Ketones, ur TRACE (*)    Leukocytes,Ua TRACE (*)    All other components within normal limits  URINE DRUG SCREEN, QUALITATIVE ([facility] ONLY) - Abnormal; Notable for the following components:   Opiate, Ur Screen POSITIVE (*)    All other components within normal limits  CBG MONITORING, ED - Abnormal; Notable for the following components:   Glucose-Capillary 112 (*)    All other components within normal limits  CULTURE, BLOOD (ROUTINE X 2)  CULTURE, BLOOD (ROUTINE X 2)  URINE CULTURE  LACTIC ACID, PLASMA  CBC WITH DIFFERENTIAL/PLATELET  PROTIME-INR  APTT     EKG EKG independently reviewed interpreted by myself (ER attending) demonstrates:  EKG demonstrates sinus rhythm at a rate of 61, narrow PR, QRS 99, QTc  444  RADIOLOGY Imaging independently reviewed and interpreted by myself demonstrates:  Head CT without acute bleed Chest x-Tesneem Dufrane without evidence of pneumonia  PROCEDURES:  Critical Care performed: Yes, see critical care procedure note(s)  CRITICAL CARE Performed by: Trinna Post   Total critical care time: 30 minutes  Critical care time was exclusive of separately billable procedures and treating other patients.  Critical care was necessary to treat or prevent imminent or life-threatening deterioration.  Critical care was time spent personally by me on the following activities: development of treatment plan with patient and/or surrogate as well as nursing, discussions with consultants, evaluation of patient's response to treatment, examination of patient, obtaining history from patient or surrogate, ordering and performing treatments and interventions, ordering and  review of laboratory studies, ordering and review of radiographic studies, pulse oximetry and re-evaluation of patient's condition.   Procedures   MEDICATIONS ORDERED IN ED: Medications  sodium chloride 0.9 % bolus 500 mL (500 mLs Intravenous New Bag/Given 09/15/22 1651)  ceFEPIme (MAXIPIME) 2 g in sodium chloride 0.9 % 100 mL IVPB (2 g Intravenous New Bag/Given 09/15/22 1650)  metroNIDAZOLE (FLAGYL) IVPB 500 mg (has no administration in time range)  vancomycin (VANCOCIN) IVPB 1000 mg/200 mL premix (has no administration in time range)  oxyCODONE (Oxy IR/ROXICODONE) immediate release tablet 2.5 mg (0 mg Oral Hold 09/15/22 1655)  lactated ringers bolus 1,000 mL (0 mLs Intravenous Stopped 09/15/22 1336)     IMPRESSION / MDM / ASSESSMENT AND PLAN / ED COURSE  I reviewed the triage vital signs and the nursing notes.  Differential diagnosis includes, but is not limited to, sepsis including pneumonia, UTI, hypovolemic shock, polypharmacy, intracranial bleed or other intracranial process, no focal deficits suggestive of acute stroke  Patient's presentation is most consistent with acute presentation with potential threat to life or bodily function.  72 year old female presenting to the emergency department for evaluation of weakness.  Initial vital signs here with improved blood pressure.  Lab work with positive UDS consistent with reported use of oxycodone.  No obvious infectious source and normal white blood cell count.  However initial lactate was within normal limits at 1.1 and increased to 3.2 despite 1 L of fluid resuscitation.  Chest x-Nyela Cortinas does have some signs of edema, will start with smaller fluid bolus of 500 cc. In obtaining further history patient has had further episodes of low blood pressure and weakness at home as low as systolics in the 60s.  This could be related to polypharmacy particularly in the use of her recent pain medication, but with her worsening lactate despite fluid  resuscitation, concern remains for infection.  Will reach out to hospitalist team to discuss possible admission.    FINAL CLINICAL IMPRESSION(S) / ED DIAGNOSES   Final diagnoses:  Weakness  Other specified hypotension     Rx / DC Orders   ED Discharge Orders     None        Note:  This document was prepared using Dragon voice recognition software and may include unintentional dictation errors.   Trinna Post, MD 09/15/22 [id number]

## 2022-09-15 NOTE — Progress Notes (Signed)
PHARMACY -  BRIEF ANTIBIOTIC NOTE   Pharmacy has received consult(s) for vancomycin and cefepime from an ED provider.  The patient's profile has been reviewed for ht/wt/allergies/indication/available labs.    One time order(s) placed for vancomycin 1000 mg x 1 and cefepime 2 grams x 1  Further antibiotics/pharmacy consults should be ordered by admitting physician if indicated.                       Thank you,  Elliot Gurney, PharmD, BCPS Clinical Pharmacist  09/15/2022 4:01 PM

## 2022-09-15 NOTE — H&P (Signed)
History and Physical    Patient: Jessica Cole ZDG:[id number] DOB: [birth year] DOA: 09/15/2022 DOS: the patient was seen and examined on 09/15/2022 PCP: Lynnea Ferrier, MD  Patient coming from: Home  Chief Complaint:  Chief Complaint  Patient presents with   Hypotension   HPI: Jessica Cole is a 72 y.o. female with medical history significant of chronic pain, hypothyroidism, obesity, hypertension presenting with hypotension and lactic acidosis.  History from patient as well as her husband.  Per report, patient had 2 episodes of patient feeling weak and dizzy with noted pressures in the 60s 70s at home.  Yesterday, patient took all of her medications with patient immediately after feeling nauseous and weak.  Had pressures in the 70s at home.  This resolved.  No reported syncopal event head trauma loss conscious.  Patient had another episode associate with taking medications including a pain regimen.  This time, husband called EMS.  Patient with noted pressures in the 60s at home.  No chest pain or shortness of breath.  No hemiparesis or confusion.  Per the husband, the the patient has been on a health clinic regimen for diabetic neuropathy including a hide nitric oxide supplement regimen.  No reported labile blood sugars.  No slurred speech. Presented to the ER afebrile, hemodynamically stable.  White count 4.9, hemoglobin 13, platelets 150, lactate 3.2, urinalysis grossly stable, urine drug screen positive for opiates, creatinine 0.8, blood sugar 124.  Chest x-ray with enlarged heart and some vascular congestion.  CT head grossly stable. Review of Systems: As mentioned in the history of present illness. All other systems reviewed and are negative. 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   a.) preoperative PCR (+) on 02/14/2021 prior to LUMBAR MICRODISCECTOMY; b.) preoperative PCR (+) 06/24/2022 prior to REVERSE SHOULDER ARTHROPLASTY W/ BICEPS TENODESIS   Obesity    OSA on CPAP    Plantar fasciitis    right   Pneumonia    Renal calculi    Rheumatoid arthritis (HCC)    RLS (restless legs syndrome) 08/23/2014   T2DM (type 2 diabetes mellitus) (HCC)    Past Surgical History:  Procedure Laterality Date   ABDOMINAL HYSTERECTOMY     partial   APPENDECTOMY     Arthroscopic surgery, knee Left    BREAST BIOPSY Right    several   BREAST BIOPSY Right 03/11/2017   u/s bx neg   BUNIONECTOMY     LEFT    COLONOSCOPY W/ POLYPECTOMY     COLONOSCOPY WITH PROPOFOL N/A 08/14/2017   Procedure: COLONOSCOPY WITH PROPOFOL;  Surgeon: Christena Deem, MD;  Location: [facility] ENDOSCOPY;  Service: Endoscopy;  Laterality: N/A;   EXTRACORPOREAL SHOCK WAVE LITHOTRIPSY Left 05/14/2017   Procedure: EXTRACORPOREAL SHOCK WAVE LITHOTRIPSY (ESWL);  Surgeon: Riki Altes, MD;  Location: [facility] ORS;  Service: Urology;  Laterality: Left;   EXTRACORPOREAL SHOCK WAVE LITHOTRIPSY Left 06/14/2020   Procedure: EXTRACORPOREAL  SHOCK WAVE LITHOTRIPSY (ESWL);  Surgeon: Sondra Come, MD;  Location: [facility] ORS;  Service: Urology;  Laterality: Left;   FOOT SURGERY     RIGHT     KNEE ARTHROSCOPY W/ MENISCAL REPAIR Left    LASIK     LUMBAR LAMINECTOMY/DECOMPRESSION MICRODISCECTOMY Right 03/01/2021   Procedure: Microdiscectomy - Lumbar five-Sacral one - right;  Surgeon: Tia Alert, MD;  Location: [facility] OR;  Service: Neurosurgery;  Laterality: Right;   MAXIMUM ACCESS (MAS)POSTERIOR LUMBAR INTERBODY FUSION (PLIF) 1 LEVEL N/A 04/25/2016   Procedure: LUMBAR FOUR-FIVE  MAXIMUM ACCESS (MAS) POSTERIOR LUMBAR  INTERBODY FUSION (PLIF) with extension of instrumentation LUMBAR TWO-FIVE;  Surgeon: Tia Alert, MD;  Location: [facility] OR;  Service: Neurosurgery;  Laterality: N/A;   MAXIMUM ACCESS (MAS)POSTERIOR LUMBAR INTERBODY FUSION (PLIF) 2 LEVEL N/A 12/13/2015   Procedure: Lumbar two-three - Lumbar three-four MAXIMUM ACCESS (MAS) POSTERIOR LUMBAR INTERBODY FUSION (PLIF)  ;  Surgeon: Tia Alert, MD;  Location: [facility] NEURO ORS;  Service: Neurosurgery;  Laterality: N/A;   REVERSE SHOULDER ARTHROPLASTY Left 03/01/2020   Procedure: REVERSE SHOULDER ARTHROPLASTY;  Surgeon: Christena Flake, MD;  Location: [facility] ORS;  Service: Orthopedics;  Laterality: Left;   REVERSE SHOULDER ARTHROPLASTY Right 07/03/2022   Procedure: REVERSE SHOULDER ARTHROPLASTY W/ BICEPS TENODESIS.;  Surgeon: Christena Flake, MD;  Location: [facility] ORS;  Service: Orthopedics;  Laterality: Right;   SHOULDER SURGERY     RIGHT    TONSILLECTOMY     Social History:  reports that she has never smoked. She has never used smokeless tobacco. She reports that she does not drink alcohol and does not use drugs.  Allergies  Allergen Reactions   Diazepam Other (See Comments)    hallucinations   Dexamethasone Other (See Comments)    During a tapered dose, once.  Was shaky (side effect, not allergy).    Family History  Problem Relation Age of Onset   Lung cancer Mother    Congestive Heart Failure Father    Depression Sister    Rheum arthritis Sister    Headache Maternal Grandfather    Migraines Maternal Grandfather    Headache Maternal Uncle    Diabetes Other    Heart disease Other    Hypertension Other    Breast cancer Neg Hx    Bladder Cancer Neg Hx    Kidney cancer Neg Hx     Prior to Admission medications   Medication Sig Start Date End Date Taking? Authorizing Provider  gabapentin (NEURONTIN) 800 MG tablet Take 1 tablet (800 mg total) by mouth 2 (two) times daily. Takes 400 mg in daytime and 800 mg at bedtime 07/03/22  Yes Poggi, Excell Seltzer, MD   alendronate (FOSAMAX) 70 MG/75ML solution Take 70 mg by mouth every 7 (seven) days. Take with a full glass of water on an empty stomach.    [provider]  aspirin 81 MG EC tablet Take 81 mg by mouth daily.    [provider]  azelastine (ASTELIN) 0.1 % nasal spray Place 1 spray into both nostrils daily as needed for rhinitis or allergies. 11/23/15   [provider]  celecoxib (CELEBREX) 200 MG capsule Take 200 mg by mouth 2 (two) times daily.    [provider]  Cyanocobalamin (B-12 COMPLIANCE INJECTION IJ) Inject as directed every 30 (thirty) days.    [provider]  DULoxetine (CYMBALTA) 60 MG capsule Take 60 mg by mouth daily.    [provider]  empagliflozin (JARDIANCE) 10  MG TABS tablet Take 10 mg by mouth every other day. Takes 1/2 tablet twice day    [provider]  fluticasone (FLONASE) 50 MCG/ACT nasal spray Place 2 sprays into both nostrils daily as needed for allergies. 04/12/14   [provider]  hydroxychloroquine (PLAQUENIL) 200 MG tablet Take by mouth 2 (two) times daily.    [provider]  levothyroxine (SYNTHROID, LEVOTHROID) 112 MCG tablet Take 112 mcg by mouth daily before breakfast.  11/07/15   [provider]  metFORMIN (GLUCOPHAGE) 1000 MG tablet Take 500 mg by mouth 2 (two) times daily with a meal.    [provider]  ONE TOUCH ULTRA TEST test strip USE 2 (TWO) TIMES DAILY USE AS INSTRUCTED. 11/13/17   [provider]  oxyCODONE (ROXICODONE) 5 MG immediate release tablet Take 1-2 tablets (5-10 mg total) by mouth every 4 (four) hours as needed for moderate pain or severe pain. 07/03/22   Poggi, Excell Seltzer, MD  pravastatin (PRAVACHOL) 20 MG tablet Take 20 mg by mouth at bedtime.    [provider]  solifenacin (VESICARE) 5 MG tablet Take 10 mg by mouth daily. 05/29/20   [provider]  telmisartan (MICARDIS) 80 MG tablet Take 80 mg by mouth daily. 01/02/19    [provider]    Physical Exam: Vitals:   09/15/22 1300 09/15/22 1330 09/15/22 1430 09/15/22 1700  BP: 124/70 125/73 129/82 136/77  Pulse: 64 68 63 63  Resp: 16 16 16 17   Temp:      SpO2:  95% 97% 97%   Physical Exam Constitutional:      Appearance: She is obese.  HENT:     Head: Normocephalic and atraumatic.     Nose: Nose normal.     Mouth/Throat:     Mouth: Mucous membranes are moist.  Eyes:     Pupils: Pupils are equal, round, and reactive to light.  Cardiovascular:     Rate and Rhythm: Normal rate and regular rhythm.  Pulmonary:     Effort: Pulmonary effort is normal.  Abdominal:     General: Abdomen is flat. Bowel sounds are normal.  Musculoskeletal:        General: Normal range of motion.     Cervical back: Normal range of motion.  Skin:    General: Skin is warm.  Neurological:     General: No focal deficit present.  Psychiatric:        Mood and Affect: Mood normal.     Data Reviewed:  There are no new results to review at this time. CT Head Wo Contrast CLINICAL DATA:  Mental status change, unknown cause. Hypotension and weakness.  EXAM: CT HEAD WITHOUT CONTRAST  TECHNIQUE: Contiguous axial images were obtained from the base of the skull through the vertex without intravenous contrast.  RADIATION DOSE REDUCTION: This exam was performed according to the departmental dose-optimization program which includes automated exposure control, adjustment of the mA and/or kV according to patient size and/or use of iterative reconstruction technique.  COMPARISON:  Head CT 08/18/2022  FINDINGS: Brain: There is no evidence of an acute infarct, intracranial hemorrhage, mass, midline shift, or extra-axial fluid collection. The ventricles and sulci are within normal limits for age. Patchy hypodensities in the cerebral white matter bilaterally are unchanged and nonspecific but compatible with mild-to-moderate chronic small vessel ischemic  disease.  Vascular: Calcified atherosclerosis at the skull base. No hyperdense vessel.  Skull: No acute fracture or suspicious osseous lesion.  Sinuses/Orbits: Visualized paranasal  sinuses and mastoid air cells are clear. Visualized portions of the orbits are unremarkable.  Other: None.  IMPRESSION: 1. No evidence of acute intracranial abnormality. 2. Mild-to-moderate chronic small vessel ischemic disease.  Electronically Signed   By: Sebastian Ache M.D.   On: 09/15/2022 11:16 DG Chest Port 1 View CLINICAL DATA:  Sepsis  EXAM: PORTABLE CHEST 1 VIEW  COMPARISON:  X-ray 03/02/2020  FINDINGS: Enlarged cardiopericardial silhouette with vascular congestion. No consolidation, pneumothorax or effusion. No edema. Overlapping cardiac leads. Bilateral shoulder arthroplasties.  IMPRESSION: Enlarged heart with some vascular congestion  Electronically Signed   By: Karen Kays M.D.   On: 09/15/2022 10:54  Lab Results  Component Value Date   WBC 4.9 09/15/2022   HGB 13.2 09/15/2022   HCT 39.8 09/15/2022   MCV 90.2 09/15/2022   PLT 150 09/15/2022   Last metabolic panel Lab Results  Component Value Date   GLUCOSE 124 (H) 09/15/2022   NA 136 09/15/2022   K 4.2 09/15/2022   CL 103 09/15/2022   CO2 24 09/15/2022   BUN 32 (H) 09/15/2022   CREATININE 0.78 09/15/2022   GFRNONAA >60 09/15/2022   CALCIUM 9.0 09/15/2022   PROT 6.8 09/15/2022   ALBUMIN 4.6 09/15/2022   BILITOT 0.7 09/15/2022   ALKPHOS 47 09/15/2022   AST 19 09/15/2022   ALT 16 09/15/2022   ANIONGAP 9 09/15/2022    Assessment and Plan: * Hypotension Patient with transient hypotension at home in setting of multiple medications including Micardis, oxycodone, gabapentin as well as a new nitric oxide regimen at home Nonfocal neuroexam No reported syncope Will hold offending medications Minimal IV fluids Orthostatics overnight The echo in setting of cardiomegaly and vascular congestion on  imaging Monitor    Lactic acidosis Lactate 3.2 in the setting of transient hypotension Suspect secondary to medication induced hypotension No overt evidence of infection at present Status post LR bolus and broad-spectrum antibiotics and pan culturing in the ER Will trend for now Monitor and reassess if patient spikes a fever or if there is any further clinical decline  Lumbar radiculitis Hold pain meds and adjunctive medications in setting of hypotension    Controlled type 2 diabetes mellitus without complication (HCC) SSI A1c  HLD (hyperlipidemia) Continue statin pending CK level  BP (high blood pressure) Limit of normal blood pressures on presentation Hold BP regimen for now Follow      Advance Care Planning:   Code Status: Full Code   Consults: None   Family Communication: Husband at the bedside   Severity of Illness: The appropriate patient status for this patient is OBSERVATION. Observation status is judged to be reasonable and necessary in order to provide the required intensity of service to ensure the patient's safety. The patient's presenting symptoms, physical exam findings, and initial radiographic and laboratory data in the context of their medical condition is felt to place them at decreased risk for further clinical deterioration. Furthermore, it is anticipated that the patient will be medically stable for discharge from the hospital within 2 midnights of admission.   Author: Floydene Flock, MD 09/15/2022 6:05 PM  For on call review [email or web address].

## 2022-09-15 NOTE — Assessment & Plan Note (Signed)
Lactate 3.2 in the setting of transient hypotension Suspect secondary to medication induced hypotension No overt evidence of infection at present Status post LR bolus and broad-spectrum antibiotics and pan culturing in the ER Will trend for now Monitor and reassess if patient spikes a fever or if there is any further clinical decline

## 2022-09-15 NOTE — ED Triage Notes (Signed)
Pt to ED via [facility] from home for c/o hypotension and weakness. Initial BP 84/58, 150 mL NS given. BP 110/68.Denies pain.

## 2022-09-16 DIAGNOSIS — I959 Hypotension, unspecified: Secondary | ICD-10-CM | POA: Diagnosis not present

## 2022-09-16 DIAGNOSIS — I10 Essential (primary) hypertension: Secondary | ICD-10-CM | POA: Diagnosis not present

## 2022-09-16 DIAGNOSIS — M5416 Radiculopathy, lumbar region: Secondary | ICD-10-CM

## 2022-09-16 DIAGNOSIS — E872 Acidosis, unspecified: Secondary | ICD-10-CM | POA: Diagnosis not present

## 2022-09-16 LAB — CBC
HCT: 37.3 % (ref 36.0–46.0)
Hemoglobin: 12.2 g/dL (ref 12.0–15.0)
MCH: 30.1 pg (ref 26.0–34.0)
MCHC: 32.7 g/dL (ref 30.0–36.0)
MCV: 92.1 fL (ref 80.0–100.0)
Platelets: 132 10*3/uL — ABNORMAL LOW (ref 150–400)
RBC: 4.05 MIL/uL (ref 3.87–5.11)
RDW: 13.1 % (ref 11.5–15.5)
WBC: 4.1 10*3/uL (ref 4.0–10.5)
nRBC: 0 % (ref 0.0–0.2)

## 2022-09-16 LAB — COMPREHENSIVE METABOLIC PANEL
ALT: 15 U/L (ref 0–44)
AST: 19 U/L (ref 15–41)
Albumin: 3.9 g/dL (ref 3.5–5.0)
Alkaline Phosphatase: 54 U/L (ref 38–126)
Anion gap: 6 (ref 5–15)
BUN: 24 mg/dL — ABNORMAL HIGH (ref 8–23)
CO2: 23 mmol/L (ref 22–32)
Calcium: 8.6 mg/dL — ABNORMAL LOW (ref 8.9–10.3)
Chloride: 109 mmol/L (ref 98–111)
Creatinine, Ser: 0.64 mg/dL (ref 0.44–1.00)
GFR, Estimated: >60 mL/min (ref >60)
Glucose, Bld: 179 mg/dL — ABNORMAL HIGH (ref 70–99)
Potassium: 4 mmol/L (ref 3.5–5.1)
Sodium: 138 mmol/L (ref 135–145)
Total Bilirubin: 0.6 mg/dL (ref 0.3–1.2)
Total Protein: 5.9 g/dL — ABNORMAL LOW (ref 6.5–8.1)

## 2022-09-16 LAB — ECHOCARDIOGRAM COMPLETE
AR max vel: 1.7 cm2
AV Area VTI: 1.64 cm2
AV Area mean vel: 1.56 cm2
AV Mean grad: 6.8 mmHg
AV Peak grad: 11.8 mmHg
Ao pk vel: 1.72 m/s
Area-P 1/2: 3.17 cm2
P 1/2 time: 535 msec
S' Lateral: 2.8 cm

## 2022-09-16 MED ORDER — HYDROCODONE-ACETAMINOPHEN 5-325 MG PO TABS
1.0000 | ORAL_TABLET | Freq: Once | ORAL | Status: AC
  Administered 2022-09-16: 1 via ORAL
  Filled 2022-09-16: qty 1

## 2022-09-17 LAB — CULTURE, BLOOD (ROUTINE X 2)
Culture: NO GROWTH
Culture: NO GROWTH

## 2022-09-18 ENCOUNTER — Ambulatory Visit: Payer: Medicare HMO

## 2022-09-18 DIAGNOSIS — M25511 Pain in right shoulder: Secondary | ICD-10-CM

## 2022-09-18 DIAGNOSIS — M25611 Stiffness of right shoulder, not elsewhere classified: Secondary | ICD-10-CM

## 2022-09-18 LAB — URINE CULTURE

## 2022-09-18 LAB — CULTURE, BLOOD (ROUTINE X 2)

## 2022-09-18 NOTE — Discharge Summary (Signed)
Physician Discharge Summary   Patient: Jessica Cole MRN: [id number] DOB: Jul 03, 1950  Admit date:     09/15/2022  Discharge date: 09/16/2022  Discharge Physician: Jessica Cole   PCP: Jessica Ferrier, MD   Recommendations at discharge:    F/up with outpt providers as requested  Discharge Diagnoses: Principal Problem:   Hypotension Active Problems:   Lactic acidosis   BP (high blood pressure)   HLD (hyperlipidemia)   Controlled type 2 diabetes mellitus without complication (HCC)   Lumbar radiculitis  Hospital Course: Assessment and Plan: * Transient Hypotension Patient with transient hypotension at home in setting of multiple medications including Micardis, oxycodone, gabapentin as well as a new nitric oxide regimen at home Nonfocal neuroexam No reported syncope held offending medications and hydrated with IV fluids BP improved quickly and she was back to baseline.  Lactic acidosis Lactate 3.2 in the setting of transient hypotension-> 0.8 with hydration. Suspect secondary to medication induced hypotension No overt evidence of infection at present No obvious s/s of infections. Blood c/s neg thus far.  Lumbar radiculitis Hold pain meds and adjunctive medications in setting of hypotension  Can resume at DC.  Controlled type 2 diabetes mellitus without complication (HCC) HLD (hyperlipidemia) BP (high blood pressure)  She was adamant in going home as feeling much better and BP back to normal. She was requested to f/up with PCP and discuss need to resume her BP meds as deem appropriate per PCP.        Disposition: Home Diet recommendation:  Discharge Diet Orders (From admission, onward)     Start     Ordered   09/16/22 0000  Diet - low sodium heart healthy        09/16/22 1045           Carb modified diet DISCHARGE MEDICATION: Allergies as of 09/16/2022       Reactions   Diazepam Other (See Comments)   hallucinations   Dexamethasone Other (See Comments)    During a tapered dose, once.  Was shaky (side effect, not allergy).        Medication List     STOP taking these medications    oxyCODONE 5 MG immediate release tablet Commonly known as: Roxicodone   telmisartan 80 MG tablet Commonly known as: MICARDIS       TAKE these medications    alendronate 70 MG/75ML solution Commonly known as: FOSAMAX Take 70 mg by mouth every 7 (seven) days. Take with a full glass of water on an empty stomach.   aspirin EC 81 MG tablet Take 81 mg by mouth daily.   azelastine 0.1 % nasal spray Commonly known as: ASTELIN Place 1 spray into both nostrils daily as needed for rhinitis or allergies.   B-12 COMPLIANCE INJECTION IJ Inject as directed every 30 (thirty) days.   DULoxetine 60 MG capsule Commonly known as: CYMBALTA Take 60 mg by mouth daily.   empagliflozin 10 MG Tabs tablet Commonly known as: JARDIANCE Take 10 mg by mouth every other day. Takes 1/2 tablet twice day   gabapentin 800 MG tablet Commonly known as: NEURONTIN Take 1 tablet (800 mg total) by mouth 2 (two) times daily. Takes 400 mg in daytime and 800 mg at bedtime   HYDROcodone-acetaminophen 5-325 MG tablet Commonly known as: NORCO/VICODIN Take 1-2 tablets by mouth every 8 (eight) hours as needed for moderate pain or severe pain.   hydroxychloroquine 200 MG tablet Commonly known as: PLAQUENIL Take by mouth 2 (two)  times daily.   levothyroxine 112 MCG tablet Commonly known as: SYNTHROID Take 112 mcg by mouth daily before breakfast.   metFORMIN 1000 MG tablet Commonly known as: GLUCOPHAGE Take 500 mg by mouth 2 (two) times daily with a meal.   pravastatin 20 MG tablet Commonly known as: PRAVACHOL Take 20 mg by mouth at bedtime.   solifenacin 5 MG tablet Commonly known as: VESICARE Take 10 mg by mouth daily.        Follow-up Information     Jessica Ferrier, MD. Go on 09/17/2022.   Specialty: Internal Medicine Why: as scheduled Contact  information: [address] [place] [address] [phone]                Discharge Exam: There were no vitals filed for this visit. Constitutional:      Appearance: She is obese.  HENT:     Head: Normocephalic and atraumatic.     Nose: Nose normal.     Mouth/Throat:     Mouth: Mucous membranes are moist.  Eyes:     Pupils: Pupils are equal, round, and reactive to light.  Cardiovascular:     Rate and Rhythm: Normal rate and regular rhythm.  Pulmonary:     Effort: Pulmonary effort is normal.  Abdominal:     General: Abdomen is flat. Bowel sounds are normal.  Musculoskeletal:        General: Normal range of motion.     Cervical back: Normal range of motion.  Skin:    General: Skin is warm.  Neurological:     General: No focal deficit present.  Psychiatric:        Mood and Affect: Mood normal.   Condition at discharge: good  The results of significant diagnostics from this hospitalization (including imaging, microbiology, ancillary and laboratory) are listed below for reference.   Imaging Studies: ECHOCARDIOGRAM COMPLETE  Result Date: 09/16/2022    ECHOCARDIOGRAM REPORT   Patient Name:   Jessica Cole Date of Exam: 09/15/2022 Medical Rec #:  [id number]      Height:       61.0 in Accession #:    [id number]     Weight:       176.0 lb Date of Birth:  1951/04/06      BSA:          1.789 m Patient Age:    72 years       BP:           134/72 mmHg Patient Gender: F              HR:           61 bpm. Exam Location:  [facility] Procedure: 2D Echo, Cardiac Doppler and Color Doppler Indications:     I51.7 Cardiomegaly  History:         Patient has no prior history of Echocardiogram examinations.                  Risk Factors:Hypertension and Diabetes. Obstructive sleep apne.  Sonographer:     Daphine Deutscher RDCS Referring Phys:  [id number] Jessica Cole Diagnosing Phys: Jessica Kendall MD IMPRESSIONS  1. Left ventricular ejection fraction, by estimation, is 60 to 65%. The  left ventricle has normal function. Left ventricular endocardial border not optimally defined to evaluate regional wall motion. Left ventricular diastolic parameters are consistent with Grade I diastolic dysfunction (impaired relaxation). Elevated left atrial pressure.  2. Right ventricular systolic function is  normal. The right ventricular size is normal. There is normal pulmonary artery systolic pressure.  3. The mitral valve is normal in structure. Trivial mitral valve regurgitation. No evidence of mitral stenosis.  4. The aortic valve has an indeterminant number of cusps. There is mild calcification of the aortic valve. There is mild thickening of the aortic valve. Aortic valve regurgitation is mild. Aortic valve sclerosis/calcification is present, without any evidence of aortic stenosis.  5. There is borderline dilatation of the ascending aorta, measuring 36 mm.  6. The inferior vena cava is normal in size with <50% respiratory variability, suggesting right atrial pressure of 8 mmHg. FINDINGS  Left Ventricle: Left ventricular ejection fraction, by estimation, is 60 to 65%. The left ventricle has normal function. Left ventricular endocardial border not optimally defined to evaluate regional wall motion. The left ventricular internal cavity size was normal in size. There is no left ventricular hypertrophy. Left ventricular diastolic parameters are consistent with Grade I diastolic dysfunction (impaired relaxation). Elevated left atrial pressure. Right Ventricle: The right ventricular size is normal. No increase in right ventricular wall thickness. Right ventricular systolic function is normal. There is normal pulmonary artery systolic pressure. The tricuspid regurgitant velocity is 2.64 m/s, and  with an assumed right atrial pressure of 8 mmHg, the estimated right ventricular systolic pressure is 35.9 mmHg. Left Atrium: Left atrial size was normal in size. Right Atrium: Right atrial size was normal in size.  Pericardium: There is no evidence of pericardial effusion. Mitral Valve: The mitral valve is normal in structure. Mild mitral annular calcification. Trivial mitral valve regurgitation. No evidence of mitral valve stenosis. Tricuspid Valve: The tricuspid valve is grossly normal. Tricuspid valve regurgitation is mild. Aortic Valve: The aortic valve has an indeterminant number of cusps. There is mild calcification of the aortic valve. There is mild thickening of the aortic valve. Aortic valve regurgitation is mild. Aortic regurgitation PHT measures 535 msec. Aortic valve sclerosis/calcification is present, without any evidence of aortic stenosis. Aortic valve mean gradient measures 6.8 mmHg. Aortic valve peak gradient measures 11.8 mmHg. Aortic valve area, by VTI measures 1.64 cm. Pulmonic Valve: The pulmonic valve was not well visualized. Pulmonic valve regurgitation is not visualized. No evidence of pulmonic stenosis. Aorta: The aortic root is normal in size and structure. There is borderline dilatation of the ascending aorta, measuring 36 mm. Pulmonary Artery: The pulmonary artery is of normal size. Venous: The inferior vena cava is normal in size with less than 50% respiratory variability, suggesting right atrial pressure of 8 mmHg. IAS/Shunts: The interatrial septum was not well visualized.  LEFT VENTRICLE PLAX 2D LVIDd:         4.80 cm   Diastology LVIDs:         2.80 cm   LV e' medial:    6.42 cm/s LV PW:         0.90 cm   LV E/e' medial:  15.1 LV IVS:        0.80 cm   LV e' lateral:   7.84 cm/s LVOT diam:     2.00 cm   LV E/e' lateral: 12.4 LV SV:         66 LV SV Index:   37 LVOT Area:     3.14 cm  RIGHT VENTRICLE             IVC RV Basal diam:  3.40 cm     IVC diam: 1.90 cm RV S prime:     12.40 cm/s  TAPSE (M-mode): 2.5 cm LEFT ATRIUM             Index        RIGHT ATRIUM           Index LA diam:        3.80 cm 2.12 cm/m   RA Area:     11.40 cm LA Vol (A2C):   58.1 ml 32.48 ml/m  RA Volume:   26.90 ml   15.04 ml/m LA Vol (A4C):   39.6 ml 22.14 ml/m LA Biplane Vol: 50.4 ml 28.17 ml/m  AORTIC VALVE AV Area (Vmax):    1.70 cm AV Area (Vmean):   1.56 cm AV Area (VTI):     1.64 cm AV Vmax:           171.97 cm/s AV Vmean:          124.065 cm/s AV VTI:            0.402 m AV Peak Grad:      11.8 mmHg AV Mean Grad:      6.8 mmHg LVOT Vmax:         93.30 cm/s LVOT Vmean:        61.550 cm/s LVOT VTI:          0.210 m LVOT/AV VTI ratio: 0.52 AI PHT:            535 msec  AORTA Ao Root diam: 3.50 cm Ao Asc diam:  3.60 cm MITRAL VALVE                TRICUSPID VALVE MV Area (PHT): 3.17 cm     TR Peak grad:   27.9 mmHg MV Decel Time: 239 msec     TR Vmax:        264.00 cm/s MV E velocity: 97.25 cm/s MV A velocity: 107.00 cm/s  SHUNTS MV E/A ratio:  0.91         Systemic VTI:  0.21 m                             Systemic Diam: 2.00 cm Jessica Kendall MD Electronically signed by Jessica Kendall MD Signature Date/Time: 09/16/2022/7:14:43 AM    Final    CT Head Wo Contrast  Result Date: 09/15/2022 CLINICAL DATA:  Mental status change, unknown cause. Hypotension and weakness. EXAM: CT HEAD WITHOUT CONTRAST TECHNIQUE: Contiguous axial images were obtained from the base of the skull through the vertex without intravenous contrast. RADIATION DOSE REDUCTION: This exam was performed according to the departmental dose-optimization program which includes automated exposure control, adjustment of the mA and/or kV according to patient size and/or use of iterative reconstruction technique. COMPARISON:  Head CT 08/18/2022 FINDINGS: Brain: There is no evidence of an acute infarct, intracranial hemorrhage, mass, midline shift, or extra-axial fluid collection. The ventricles and sulci are within normal limits for age. Patchy hypodensities in the cerebral white matter bilaterally are unchanged and nonspecific but compatible with mild-to-moderate chronic small vessel ischemic disease. Vascular: Calcified atherosclerosis at the skull base. No  hyperdense vessel. Skull: No acute fracture or suspicious osseous lesion. Sinuses/Orbits: Visualized paranasal sinuses and mastoid air cells are clear. Visualized portions of the orbits are unremarkable. Other: None. IMPRESSION: 1. No evidence of acute intracranial abnormality. 2. Mild-to-moderate chronic small vessel ischemic disease. Electronically Signed   By: Sebastian Ache M.D.   On: 09/15/2022 11:16   DG Chest Port 1 View  Result Date:  09/15/2022 CLINICAL DATA:  Sepsis EXAM: PORTABLE CHEST 1 VIEW COMPARISON:  X-ray 03/02/2020 FINDINGS: Enlarged cardiopericardial silhouette with vascular congestion. No consolidation, pneumothorax or effusion. No edema. Overlapping cardiac leads. Bilateral shoulder arthroplasties. IMPRESSION: Enlarged heart with some vascular congestion Electronically Signed   By: Karen Kays M.D.   On: 09/15/2022 10:54    Microbiology: Results for orders placed or performed during the hospital encounter of 09/15/22  Blood Culture (routine x 2)     Status: None (Preliminary result)   Collection Time: 09/15/22 10:52 AM   Specimen: BLOOD  Result Value Ref Range Status   Specimen Description BLOOD RIGHT ARM  Final   Special Requests   Final    BOTTLES DRAWN AEROBIC AND ANAEROBIC Blood Culture adequate volume   Culture   Final    NO GROWTH 3 DAYS Performed at [facility], [address] [place], [place] [address]    Report Status PENDING  Incomplete  Blood Culture (routine x 2)     Status: None (Preliminary result)   Collection Time: 09/16/22  2:57 AM   Specimen: BLOOD  Result Value Ref Range Status   Specimen Description BLOOD RIGHT ARM  Final   Special Requests   Final    BOTTLES DRAWN AEROBIC AND ANAEROBIC Blood Culture results may not be optimal due to an excessive volume of blood received in culture bottles   Culture   Final    NO GROWTH 2 DAYS Performed at [facility], [address] [place], [place] [address]    Report Status PENDING   Incomplete    Labs: CBC: Recent Labs  Lab 09/15/22 1052 09/16/22 0257  WBC 4.9 4.1  NEUTROABS 3.2  --   HGB 13.2 12.2  HCT 39.8 37.3  MCV 90.2 92.1  PLT 150 132*   Basic Metabolic Panel: Recent Labs  Lab 09/15/22 1052 09/16/22 0257  NA 136 138  K 4.2 4.0  CL 103 109  CO2 24 23  GLUCOSE 124* 179*  BUN 32* 24*  CREATININE 0.78 0.64  CALCIUM 9.0 8.6*   Liver Function Tests: Recent Labs  Lab 09/15/22 1052 09/16/22 0257  AST 19 19  ALT 16 15  ALKPHOS 47 54  BILITOT 0.7 0.6  PROT 6.8 5.9*  ALBUMIN 4.6 3.9   CBG: Recent Labs  Lab 09/15/22 1025  GLUCAP 112*    Discharge time spent: greater than 30 minutes.  Signed: Delfino Lovett, MD [facility] 09/18/2022

## 2022-09-18 NOTE — Therapy (Signed)
OUTPATIENT PHYSICAL THERAPY TREATMENT NOTE And Progress Report (07/22/2022 - 09/18/2022)   Patient Name: Jessica Cole MRN: [id number] DOB:Aug 25, 1950, 72 y.o., female Today's Date: 09/18/2022  PCP: Lynnea Ferrier, MD  REFERRING PROVIDER: Anson [place], PA-C   END OF SESSION:  PT End of Session - 09/18/22 1042     Visit Number 10    Number of Visits 25    Date for PT Re-Evaluation 10/16/22    Progress Note Due on Visit 10    PT Start Time 1043   Time taken to Review MD note for clearance   PT Stop Time 1117    PT Time Calculation (min) 34 min    Activity Tolerance Patient tolerated treatment well    Behavior During Therapy WFL for tasks assessed/performed                     Past Medical History:  Diagnosis Date   Anxiety    Asthma    seasonal with allergies   Colon polyps    Degenerative arthritis    Depression    Environmental allergies    Family history of adverse reaction to anesthesia    a.) PONV in 1st degree relative (sister)   Fatty liver    Glaucoma    History of bronchitis    History of kidney stones    History of pneumonia    Hypertension    Hypothyroidism    Migraines    Nose colonized with MRSA 02/14/2021   a.) preoperative PCR (+) on 02/14/2021 prior to LUMBAR MICRODISCECTOMY; b.) preoperative PCR (+) 06/24/2022 prior to REVERSE SHOULDER ARTHROPLASTY W/ BICEPS TENODESIS   Obesity    OSA on CPAP    Plantar fasciitis    right   Pneumonia    Renal calculi    Rheumatoid arthritis (HCC)    RLS (restless legs syndrome) 08/23/2014   T2DM (type 2 diabetes mellitus) (HCC)    Past Surgical History:  Procedure Laterality Date   ABDOMINAL HYSTERECTOMY     partial   APPENDECTOMY     Arthroscopic surgery, knee Left    BREAST BIOPSY Right    several   BREAST BIOPSY Right 03/11/2017   u/s bx neg   BUNIONECTOMY     LEFT    COLONOSCOPY W/ POLYPECTOMY     COLONOSCOPY WITH PROPOFOL N/A 08/14/2017   Procedure: COLONOSCOPY WITH  PROPOFOL;  Surgeon: Christena Deem, MD;  Location: [facility] ENDOSCOPY;  Service: Endoscopy;  Laterality: N/A;   EXTRACORPOREAL SHOCK WAVE LITHOTRIPSY Left 05/14/2017   Procedure: EXTRACORPOREAL SHOCK WAVE LITHOTRIPSY (ESWL);  Surgeon: Riki Altes, MD;  Location: [facility] ORS;  Service: Urology;  Laterality: Left;   EXTRACORPOREAL SHOCK WAVE LITHOTRIPSY Left 06/14/2020   Procedure: EXTRACORPOREAL SHOCK WAVE LITHOTRIPSY (ESWL);  Surgeon: Sondra Come, MD;  Location: [facility] ORS;  Service: Urology;  Laterality: Left;   FOOT SURGERY     RIGHT     KNEE ARTHROSCOPY W/ MENISCAL REPAIR Left    LASIK     LUMBAR LAMINECTOMY/DECOMPRESSION MICRODISCECTOMY Right 03/01/2021   Procedure: Microdiscectomy - Lumbar five-Sacral one - right;  Surgeon: Tia Alert, MD;  Location: [facility] OR;  Service: Neurosurgery;  Laterality: Right;   MAXIMUM ACCESS (MAS)POSTERIOR LUMBAR INTERBODY FUSION (PLIF) 1 LEVEL N/A 04/25/2016   Procedure: LUMBAR FOUR-FIVE  MAXIMUM ACCESS (MAS) POSTERIOR LUMBAR INTERBODY FUSION (PLIF) with extension of instrumentation LUMBAR TWO-FIVE;  Surgeon: Tia Alert, MD;  Location: [facility] OR;  Service: Neurosurgery;  Laterality:  N/A;   MAXIMUM ACCESS (MAS)POSTERIOR LUMBAR INTERBODY FUSION (PLIF) 2 LEVEL N/A 12/13/2015   Procedure: Lumbar two-three - Lumbar three-four MAXIMUM ACCESS (MAS) POSTERIOR LUMBAR INTERBODY FUSION (PLIF)  ;  Surgeon: Tia Alert, MD;  Location: [facility] NEURO ORS;  Service: Neurosurgery;  Laterality: N/A;   REVERSE SHOULDER ARTHROPLASTY Left 03/01/2020   Procedure: REVERSE SHOULDER ARTHROPLASTY;  Surgeon: Christena Flake, MD;  Location: [facility] ORS;  Service: Orthopedics;  Laterality: Left;   REVERSE SHOULDER ARTHROPLASTY Right 07/03/2022   Procedure: REVERSE SHOULDER ARTHROPLASTY W/ BICEPS TENODESIS.;  Surgeon: Christena Flake, MD;  Location: [facility] ORS;  Service: Orthopedics;  Laterality: Right;   SHOULDER SURGERY     RIGHT    TONSILLECTOMY     Patient Active Problem List   Diagnosis Date  Noted   Hypotension 09/15/2022   Lactic acidosis 09/15/2022   Syncope 08/19/2022   S/P lumbar laminectomy 03/01/2021   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) 01/03/2019   Chronic cough 11/19/2016   Morbid obesity (HCC) 09/26/2016   Left ovarian cyst 05/30/2016   S/P lumbar spinal fusion 12/13/2015   Aortic calcification (HCC) 07/18/2015   Controlled type 2 diabetes mellitus without complication (HCC) 05/16/2015   Thrombocytopenia (HCC) 05/16/2015   Recurrent major depressive disorder, in full remission (HCC) 05/16/2015   Essential (primary) hypertension 05/16/2015   Fatty infiltration of liver 03/15/2015   Aortic valve stenosis, nonrheumatic 01/18/2015   Sciatica of right side 01/09/2015   Type 2 diabetes mellitus (HCC) 11/10/2014   RLS (restless legs syndrome) 08/23/2014   Neuritis or radiculitis due to rupture of lumbar intervertebral disc 06/13/2014   Degeneration of intervertebral disc of lumbar region 06/13/2014   Lumbar radiculitis 06/13/2014   Arthritis 01/23/2014   Arthritis of knee, degenerative 11/15/2013   Anxiety and depression 09/29/2013   Insomnia 09/29/2013   OSA on CPAP 08/29/2013   History of nephrolithiasis 08/29/2013   Abnormal presence of protein in urine 08/29/2013   Headache, migraine 08/29/2013   BP (high blood pressure) 08/29/2013   HLD (hyperlipidemia) 08/29/2013   Allergic rhinitis 08/29/2013   Intractable migraine without aura 02/18/2013    REFERRING DIAG: Reverse right total shoulder arthroplasty with biceps tenodesis.    THERAPY DIAG:  Right shoulder pain, unspecified chronicity  Stiffness of right shoulder, not elsewhere classified  Rationale for Evaluation and Treatment Rehabilitation  PERTINENT HISTORY: S/P R reverse total shoulder replacement on 07/03/2022. Feels a little bit aggravated currently after taking a shower but usually goes away. Has had a  couple of PT visits for home health. Exercises included pendulums and PROM.   PRECAUTIONS: PROM during initial weeks, no reaching behind back  Per BERT Daisy Blossom III, MD:  It should be safe for her to resume physical therapy on her shoulder, however.  Per Dr. Daniel Nones III, MD note on 09/17/2022: "She is medically stable to resume physical therapy immediately. "     SUBJECTIVE:   SUBJECTIVE STATEMENT: Feels better from her ED visits. No R shoulder pain currently. Has an appointment with the cardiologist (Dr. Juliann Pares 11 am on Monday 09/22/2022)    PAIN:  Are you having pain? See subjective   TODAY'S TREATMENT:  DATE: 09/18/2022  Therapeutic exercises  Currently following the [facility] Reverse Total Shoulder Replacement Protocol  Blood pressure, L arm sitting, mechanically taken, normal cuff: 130/64, HR 76  Seated R shoulder AAROM Pulley  Flexion 10x5 seconds for 2 sets  Scaption 10x5 seconds for 2 sets  Abduction 10x5 seconds for 2 sets  Standing R shoulder AROM: flexion, abduction      Standing manually resisted R shoulder flexion, abduction, ER, IR 1x each way  Reviewed progress/current status with PT towards goals.   Reclined R shoulder ER AAROM  in scapular plane 10x2     Improved exercise technique, movement at target joints, use of target muscles after mod verbal, visual, tactile cues.      Response to treatment Pt tolerated session well without aggravation of shoulder symptoms.   Clinical impression Pt currently 11 weeks post op. Pt demonstrates improved R shoulder AROM, strength, and overall function since initial evaluation. Pt was unable to addend her recent past few appointments secondary to medical reasons but was cleared to continue PT by Dr. Daniel Nones III, MD in his note on 09/17/2022. Continued working on  improving R shoulder AAROM to decrease stiffness. Pt tolerated session well without aggravation of symptoms. Pt will benefit from continued skilled physical therapy services to improve ROM, strength, and function.        PATIENT EDUCATION: Education details: there-ex Person educated: Patient Education method: Explanation and Verbal cues Education comprehension: verbalized understanding and returned demonstration  HOME EXERCISE PROGRAM: Access Code: [email or web address] URL: [email or web address] Date: 08/04/2022 Prepared by: Loralyn Freshwater  Exercises - Supine Shoulder Flexion AAROM with Hands Clasped  - 1 x daily - 7 x weekly - 3 sets - 10 reps  - Seated Shoulder Flexion AAROM with Pulley Behind  - 3 x daily - 7 x weekly - 3 sets - 10 reps - Seated Shoulder Abduction AAROM with Pulley Behind  - 3 x daily - 7 x weekly - 3 sets - 10 reps    PT Short Term Goals - 09/18/22 1048       PT SHORT TERM GOAL #1   Title Patient will be independent with her initial HEP to improve ROM and R UE function.    Baseline No questions wiht her HEP (09/18/2022)    Time 3    Period Weeks    Status Achieved    Target Date 08/14/22              PT Long Term Goals - 09/18/22 1104       PT LONG TERM GOAL #1   Title Pt will have a leat 120 degrees R shoulder flexion and abduction A/AROM to promote ability to reach and use her R UE for functional tasks.    Baseline R shoulder PROM 95 degrees flexion, 72 degrees abduction (07/22/2022); R shoulder AROM 123 degrees flexion, 107 degrees abduction (09/18/2022)    Time 12    Period Weeks    Status Partially Met    Target Date 10/16/22      PT LONG TERM GOAL #2   Title Pt will improve R shoulder ER A/AROM to at least 60 degrees to promote ability to reach behind her head.    Baseline R shoulder ER PROM in scapular plane: 33 degrees (07/22/2022); R shoulder in scapular plane: ER AAROM 41 degrees (09/18/2022)    Time 12    Period Weeks    Status  Partially Met    Target Date 10/16/22  PT LONG TERM GOAL #3   Title Pt will have at least 4/5 R shoulder strength at all planes to promote ability to use her R UE for functional tasks.    Baseline R shoulder strength not tested yet to allow for more healing (07/22/2022); R shoulder: 4+/5 flexion, 4+/5 abduction, 4/5 ER, 4/5 IR (09/18/2022)    Time 12    Period Weeks    Status Achieved    Target Date 10/16/22      PT LONG TERM GOAL #4   Title Pt will improve R shoulder FOTO score to at least 50 as a demonstration of improved function.    Baseline R shoulder FOTO score 34 (07/22/2022)    Time 12    Period Weeks    Status New    Target Date 10/16/22              Plan - 09/18/22 1303     Clinical Impression Statement Pt currently 11 weeks post op. Pt demonstrates improved R shoulder AROM, strength, and overall function since initial evaluation. Pt was unable to addend her recent past few appointments secondary to medical reasons but was cleared to continue PT by Dr. Daniel Nones III, MD in his note on 09/17/2022. Continued working on improving R shoulder AAROM to decrease stiffness. Pt tolerated session well without aggravation of symptoms. Pt will benefit from continued skilled physical therapy services to improve ROM, strength, and function.    Personal Factors and Comorbidities Comorbidity 3+;Age;Past/Current Experience;Time since onset of injury/illness/exacerbation;Fitness    Comorbidities Anxiety, depression, HTN, DM, arthritis    Examination-Activity Limitations Bathing;Carry;Lift;Bed Mobility;Toileting;Dressing;Reach Overhead;Caring for Others;Self Feeding;Hygiene/Grooming    Stability/Clinical Decision Making Stable/Uncomplicated    Clinical Decision Making Low    Rehab Potential Fair    PT Frequency 2x / week    PT Duration 12 weeks    PT Treatment/Interventions Therapeutic activities;Therapeutic exercise;Neuromuscular re-education;Patient/family education;Manual  techniques;Aquatic Therapy;Electrical Stimulation    PT Next Visit Plan PROM, manual techniques, modalities PRN    PT Home Exercise Plan Medbridge Access Code: [email or web address]    Consulted and Agree with Plan of Care Patient              Thank you for your referral.  Loralyn Freshwater PT, DPT  09/18/2022, 1:46 PM

## 2022-09-20 LAB — CULTURE, BLOOD (ROUTINE X 2): Special Requests: ADEQUATE

## 2022-09-20 LAB — URINE CULTURE

## 2022-09-21 LAB — CULTURE, BLOOD (ROUTINE X 2)

## 2022-09-22 ENCOUNTER — Ambulatory Visit: Payer: Medicare HMO

## 2022-09-22 DIAGNOSIS — M25511 Pain in right shoulder: Secondary | ICD-10-CM

## 2022-09-22 DIAGNOSIS — M25611 Stiffness of right shoulder, not elsewhere classified: Secondary | ICD-10-CM

## 2022-09-22 NOTE — Therapy (Signed)
OUTPATIENT PHYSICAL THERAPY TREATMENT NOTE    Patient Name: Jessica Cole MRN: [id number] DOB:1950/09/07, 72 y.o., female Today's Date: 09/22/2022  PCP: Lynnea Ferrier, MD  REFERRING PROVIDER: Anson [place], PA-C   END OF SESSION:  PT End of Session - 09/22/22 1350     Visit Number 11    Number of Visits 25    Date for PT Re-Evaluation 10/16/22    Progress Note Due on Visit 20    PT Start Time 1350    PT Stop Time 1429    PT Time Calculation (min) 39 min    Activity Tolerance Patient tolerated treatment well    Behavior During Therapy WFL for tasks assessed/performed                      Past Medical History:  Diagnosis Date   Anxiety    Asthma    seasonal with allergies   Colon polyps    Degenerative arthritis    Depression    Environmental allergies    Family history of adverse reaction to anesthesia    a.) PONV in 1st degree relative (sister)   Fatty liver    Glaucoma    History of bronchitis    History of kidney stones    History of pneumonia    Hypertension    Hypothyroidism    Migraines    Nose colonized with MRSA 02/14/2021   a.) preoperative PCR (+) on 02/14/2021 prior to LUMBAR MICRODISCECTOMY; b.) preoperative PCR (+) 06/24/2022 prior to REVERSE SHOULDER ARTHROPLASTY W/ BICEPS TENODESIS   Obesity    OSA on CPAP    Plantar fasciitis    right   Pneumonia    Renal calculi    Rheumatoid arthritis (HCC)    RLS (restless legs syndrome) 08/23/2014   T2DM (type 2 diabetes mellitus) (HCC)    Past Surgical History:  Procedure Laterality Date   ABDOMINAL HYSTERECTOMY     partial   APPENDECTOMY     Arthroscopic surgery, knee Left    BREAST BIOPSY Right    several   BREAST BIOPSY Right 03/11/2017   u/s bx neg   BUNIONECTOMY     LEFT    COLONOSCOPY W/ POLYPECTOMY     COLONOSCOPY WITH PROPOFOL N/A 08/14/2017   Procedure: COLONOSCOPY WITH PROPOFOL;  Surgeon: Christena Deem, MD;  Location: [facility] ENDOSCOPY;  Service: Endoscopy;   Laterality: N/A;   EXTRACORPOREAL SHOCK WAVE LITHOTRIPSY Left 05/14/2017   Procedure: EXTRACORPOREAL SHOCK WAVE LITHOTRIPSY (ESWL);  Surgeon: Riki Altes, MD;  Location: [facility] ORS;  Service: Urology;  Laterality: Left;   EXTRACORPOREAL SHOCK WAVE LITHOTRIPSY Left 06/14/2020   Procedure: EXTRACORPOREAL SHOCK WAVE LITHOTRIPSY (ESWL);  Surgeon: Sondra Come, MD;  Location: [facility] ORS;  Service: Urology;  Laterality: Left;   FOOT SURGERY     RIGHT     KNEE ARTHROSCOPY W/ MENISCAL REPAIR Left    LASIK     LUMBAR LAMINECTOMY/DECOMPRESSION MICRODISCECTOMY Right 03/01/2021   Procedure: Microdiscectomy - Lumbar five-Sacral one - right;  Surgeon: Tia Alert, MD;  Location: [facility] OR;  Service: Neurosurgery;  Laterality: Right;   MAXIMUM ACCESS (MAS)POSTERIOR LUMBAR INTERBODY FUSION (PLIF) 1 LEVEL N/A 04/25/2016   Procedure: LUMBAR FOUR-FIVE  MAXIMUM ACCESS (MAS) POSTERIOR LUMBAR INTERBODY FUSION (PLIF) with extension of instrumentation LUMBAR TWO-FIVE;  Surgeon: Tia Alert, MD;  Location: [facility] OR;  Service: Neurosurgery;  Laterality: N/A;   MAXIMUM ACCESS (MAS)POSTERIOR LUMBAR INTERBODY FUSION (PLIF) 2 LEVEL N/A  12/13/2015   Procedure: Lumbar two-three - Lumbar three-four MAXIMUM ACCESS (MAS) POSTERIOR LUMBAR INTERBODY FUSION (PLIF)  ;  Surgeon: Tia Alert, MD;  Location: [facility] NEURO ORS;  Service: Neurosurgery;  Laterality: N/A;   REVERSE SHOULDER ARTHROPLASTY Left 03/01/2020   Procedure: REVERSE SHOULDER ARTHROPLASTY;  Surgeon: Christena Flake, MD;  Location: [facility] ORS;  Service: Orthopedics;  Laterality: Left;   REVERSE SHOULDER ARTHROPLASTY Right 07/03/2022   Procedure: REVERSE SHOULDER ARTHROPLASTY W/ BICEPS TENODESIS.;  Surgeon: Christena Flake, MD;  Location: [facility] ORS;  Service: Orthopedics;  Laterality: Right;   SHOULDER SURGERY     RIGHT    TONSILLECTOMY     Patient Active Problem List   Diagnosis Date Noted   Hypotension 09/15/2022   Lactic acidosis 09/15/2022   Syncope 08/19/2022   S/P  lumbar laminectomy 03/01/2021   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) 01/03/2019   Chronic cough 11/19/2016   Morbid obesity (HCC) 09/26/2016   Left ovarian cyst 05/30/2016   S/P lumbar spinal fusion 12/13/2015   Aortic calcification (HCC) 07/18/2015   Controlled type 2 diabetes mellitus without complication (HCC) 05/16/2015   Thrombocytopenia (HCC) 05/16/2015   Recurrent major depressive disorder, in full remission (HCC) 05/16/2015   Essential (primary) hypertension 05/16/2015   Fatty infiltration of liver 03/15/2015   Aortic valve stenosis, nonrheumatic 01/18/2015   Sciatica of right side 01/09/2015   Type 2 diabetes mellitus (HCC) 11/10/2014   RLS (restless legs syndrome) 08/23/2014   Neuritis or radiculitis due to rupture of lumbar intervertebral disc 06/13/2014   Degeneration of intervertebral disc of lumbar region 06/13/2014   Lumbar radiculitis 06/13/2014   Arthritis 01/23/2014   Arthritis of knee, degenerative 11/15/2013   Anxiety and depression 09/29/2013   Insomnia 09/29/2013   OSA on CPAP 08/29/2013   History of nephrolithiasis 08/29/2013   Abnormal presence of protein in urine 08/29/2013   Headache, migraine 08/29/2013   BP (high blood pressure) 08/29/2013   HLD (hyperlipidemia) 08/29/2013   Allergic rhinitis 08/29/2013   Intractable migraine without aura 02/18/2013    REFERRING DIAG: Reverse right total shoulder arthroplasty with biceps tenodesis.    THERAPY DIAG:  Right shoulder pain, unspecified chronicity  Stiffness of right shoulder, not elsewhere classified  Rationale for Evaluation and Treatment Rehabilitation  PERTINENT HISTORY: S/P R reverse total shoulder replacement on 07/03/2022. Feels a little bit aggravated currently after taking a shower but usually goes away. Has had a couple of PT visits for home health. Exercises included pendulums and PROM.   PRECAUTIONS:  PROM during initial weeks, no reaching behind back  Per BERT Daisy Blossom III, MD:  It should be safe for her to resume physical therapy on her shoulder, however.  Per Dr. Daniel Nones III, MD note on 09/17/2022: "She is medically stable to resume physical therapy immediately. "     SUBJECTIVE:   SUBJECTIVE STATEMENT: R shoulder is pretty good. MD appointment, pt things went pretty good.     PAIN:  Are you having pain? See subjective   TODAY'S TREATMENT:  DATE: 09/22/2022  Therapeutic exercises  Currently following the [facility] Reverse Total Shoulder Replacement Protocol  Blood pressure, L arm sitting, mechanically taken, normal cuff: 128/57, HR 773  Seated R shoulder AAROM Pulley  Flexion 10x5 seconds for 2 sets  Scaption 10x5 seconds for 2 sets  Abduction 10x5 seconds for 2 sets   Cues for comfortable range  Seated B scapular retraction yellow band 10x5 seconds, then 10x2 no holds   Shoulder isometrics  Flexion 10x5 seconds   Abduction 10x5 seconds   ER 10x5 seconds   Seated IR 10x5 seconds (with use of PVC rod)   Seated R shoulder A/AROM with PT assist PRN  Flexion 5x, 10x  Scaption 10x, 5x  Abduction 5x, 10x    Improved exercise technique, movement at target joints, use of target muscles after mod verbal, visual, tactile cues.      Response to treatment Fair tolerance to today's session   Clinical impression Pt currently 11 - 12 weeks post op. Continued working on A/AROM, as well as deltoid and scapular strength to promote ability to raise her R arm up more comfortably. Fair tolerance to today's session. Pt will benefit from continued skilled physical therapy services to improve ROM, strength, and function.        PATIENT EDUCATION: Education details: there-ex Person educated: Patient Education method:  Explanation and Verbal cues Education comprehension: verbalized understanding and returned demonstration  HOME EXERCISE PROGRAM: Access Code: [email or web address] URL: [email or web address] Date: 08/04/2022 Prepared by: Loralyn Freshwater  Exercises - Supine Shoulder Flexion AAROM with Hands Clasped  - 1 x daily - 7 x weekly - 3 sets - 10 reps  - Seated Shoulder Flexion AAROM with Pulley Behind  - 3 x daily - 7 x weekly - 3 sets - 10 reps - Seated Shoulder Abduction AAROM with Pulley Behind  - 3 x daily - 7 x weekly - 3 sets - 10 reps    PT Short Term Goals - 09/18/22 1048       PT SHORT TERM GOAL #1   Title Patient will be independent with her initial HEP to improve ROM and R UE function.    Baseline No questions wiht her HEP (09/18/2022)    Time 3    Period Weeks    Status Achieved    Target Date 08/14/22              PT Long Term Goals - 09/18/22 1104       PT LONG TERM GOAL #1   Title Pt will have a leat 120 degrees R shoulder flexion and abduction A/AROM to promote ability to reach and use her R UE for functional tasks.    Baseline R shoulder PROM 95 degrees flexion, 72 degrees abduction (07/22/2022); R shoulder AROM 123 degrees flexion, 107 degrees abduction (09/18/2022)    Time 12    Period Weeks    Status Partially Met    Target Date 10/16/22      PT LONG TERM GOAL #2   Title Pt will improve R shoulder ER A/AROM to at least 60 degrees to promote ability to reach behind her head.    Baseline R shoulder ER PROM in scapular plane: 33 degrees (07/22/2022); R shoulder in scapular plane: ER AAROM 41 degrees (09/18/2022)    Time 12    Period Weeks    Status Partially Met    Target Date 10/16/22      PT LONG TERM GOAL #3   Title Pt will have  at least 4/5 R shoulder strength at all planes to promote ability to use her R UE for functional tasks.    Baseline R shoulder strength not tested yet to allow for more healing (07/22/2022); R shoulder: 4+/5 flexion, 4+/5 abduction,  4/5 ER, 4/5 IR (09/18/2022)    Time 12    Period Weeks    Status Achieved    Target Date 10/16/22      PT LONG TERM GOAL #4   Title Pt will improve R shoulder FOTO score to at least 50 as a demonstration of improved function.    Baseline R shoulder FOTO score 34 (07/22/2022)    Time 12    Period Weeks    Status New    Target Date 10/16/22              Plan - 09/22/22 1345     Clinical Impression Statement Pt currently 11 - 12 weeks post op. Continued working on A/AROM, as well as deltoid and scapular strength to promote ability to raise her R arm up more comfortably. Fair tolerance to today's session. Pt will benefit from continued skilled physical therapy services to improve ROM, strength, and function.    Personal Factors and Comorbidities Comorbidity 3+;Age;Past/Current Experience;Time since onset of injury/illness/exacerbation;Fitness    Comorbidities Anxiety, depression, HTN, DM, arthritis    Examination-Activity Limitations Bathing;Carry;Lift;Bed Mobility;Toileting;Dressing;Reach Overhead;Caring for Others;Self Feeding;Hygiene/Grooming    Stability/Clinical Decision Making Stable/Uncomplicated    Rehab Potential Fair    PT Frequency 2x / week    PT Duration 12 weeks    PT Treatment/Interventions Therapeutic activities;Therapeutic exercise;Neuromuscular re-education;Patient/family education;Manual techniques;Aquatic Therapy;Electrical Stimulation    PT Next Visit Plan PROM, manual techniques, modalities PRN    PT Home Exercise Plan Medbridge Access Code: [email or web address]    Consulted and Agree with Plan of Care Patient               Loralyn Freshwater PT, DPT  09/22/2022, 2:38 PM

## 2022-09-25 ENCOUNTER — Ambulatory Visit: Payer: Medicare HMO

## 2022-09-25 DIAGNOSIS — M25511 Pain in right shoulder: Secondary | ICD-10-CM | POA: Diagnosis not present

## 2022-09-25 DIAGNOSIS — M25611 Stiffness of right shoulder, not elsewhere classified: Secondary | ICD-10-CM

## 2022-09-25 NOTE — Therapy (Signed)
OUTPATIENT PHYSICAL THERAPY TREATMENT NOTE    Patient Name: Jessica Cole MRN: [id number] DOB:09-Aug-1950, 72 y.o., female Today's Date: 09/25/2022  PCP: Lynnea Ferrier, MD  REFERRING PROVIDER: Anson Oregon, PA-C   END OF SESSION:  PT End of Session - 09/25/22 1351     Visit Number 12    Number of Visits 25    Date for PT Re-Evaluation 10/16/22    Progress Note Due on Visit 20    PT Start Time 1352    PT Stop Time 1431    PT Time Calculation (min) 39 min    Activity Tolerance Patient tolerated treatment well    Behavior During Therapy WFL for tasks assessed/performed                       Past Medical History:  Diagnosis Date   Anxiety    Asthma    seasonal with allergies   Colon polyps    Degenerative arthritis    Depression    Environmental allergies    Family history of adverse reaction to anesthesia    a.) PONV in 1st degree relative (sister)   Fatty liver    Glaucoma    History of bronchitis    History of kidney stones    History of pneumonia    Hypertension    Hypothyroidism    Migraines    Nose colonized with MRSA 02/14/2021   a.) preoperative PCR (+) on 02/14/2021 prior to LUMBAR MICRODISCECTOMY; b.) preoperative PCR (+) 06/24/2022 prior to REVERSE SHOULDER ARTHROPLASTY W/ BICEPS TENODESIS   Obesity    OSA on CPAP    Plantar fasciitis    right   Pneumonia    Renal calculi    Rheumatoid arthritis (HCC)    RLS (restless legs syndrome) 08/23/2014   T2DM (type 2 diabetes mellitus) (HCC)    Past Surgical History:  Procedure Laterality Date   ABDOMINAL HYSTERECTOMY     partial   APPENDECTOMY     Arthroscopic surgery, knee Left    BREAST BIOPSY Right    several   BREAST BIOPSY Right 03/11/2017   u/s bx neg   BUNIONECTOMY     LEFT    COLONOSCOPY W/ POLYPECTOMY     COLONOSCOPY WITH PROPOFOL N/A 08/14/2017   Procedure: COLONOSCOPY WITH PROPOFOL;  Surgeon: Christena Deem, MD;  Location: [facility] ENDOSCOPY;  Service:  Endoscopy;  Laterality: N/A;   EXTRACORPOREAL SHOCK WAVE LITHOTRIPSY Left 05/14/2017   Procedure: EXTRACORPOREAL SHOCK WAVE LITHOTRIPSY (ESWL);  Surgeon: Riki Altes, MD;  Location: [facility] ORS;  Service: Urology;  Laterality: Left;   EXTRACORPOREAL SHOCK WAVE LITHOTRIPSY Left 06/14/2020   Procedure: EXTRACORPOREAL SHOCK WAVE LITHOTRIPSY (ESWL);  Surgeon: Sondra Come, MD;  Location: [facility] ORS;  Service: Urology;  Laterality: Left;   FOOT SURGERY     RIGHT     KNEE ARTHROSCOPY W/ MENISCAL REPAIR Left    LASIK     LUMBAR LAMINECTOMY/DECOMPRESSION MICRODISCECTOMY Right 03/01/2021   Procedure: Microdiscectomy - Lumbar five-Sacral one - right;  Surgeon: Tia Alert, MD;  Location: [facility] OR;  Service: Neurosurgery;  Laterality: Right;   MAXIMUM ACCESS (MAS)POSTERIOR LUMBAR INTERBODY FUSION (PLIF) 1 LEVEL N/A 04/25/2016   Procedure: LUMBAR FOUR-FIVE  MAXIMUM ACCESS (MAS) POSTERIOR LUMBAR INTERBODY FUSION (PLIF) with extension of instrumentation LUMBAR TWO-FIVE;  Surgeon: Tia Alert, MD;  Location: [facility] OR;  Service: Neurosurgery;  Laterality: N/A;   MAXIMUM ACCESS (MAS)POSTERIOR LUMBAR INTERBODY FUSION (PLIF) 2 LEVEL  N/A 12/13/2015   Procedure: Lumbar two-three - Lumbar three-four MAXIMUM ACCESS (MAS) POSTERIOR LUMBAR INTERBODY FUSION (PLIF)  ;  Surgeon: Tia Alert, MD;  Location: [facility] NEURO ORS;  Service: Neurosurgery;  Laterality: N/A;   REVERSE SHOULDER ARTHROPLASTY Left 03/01/2020   Procedure: REVERSE SHOULDER ARTHROPLASTY;  Surgeon: Christena Flake, MD;  Location: [facility] ORS;  Service: Orthopedics;  Laterality: Left;   REVERSE SHOULDER ARTHROPLASTY Right 07/03/2022   Procedure: REVERSE SHOULDER ARTHROPLASTY W/ BICEPS TENODESIS.;  Surgeon: Christena Flake, MD;  Location: [facility] ORS;  Service: Orthopedics;  Laterality: Right;   SHOULDER SURGERY     RIGHT    TONSILLECTOMY     Patient Active Problem List   Diagnosis Date Noted   Hypotension 09/15/2022   Lactic acidosis 09/15/2022   Syncope  08/19/2022   S/P lumbar laminectomy 03/01/2021   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) 01/03/2019   Chronic cough 11/19/2016   Morbid obesity (HCC) 09/26/2016   Left ovarian cyst 05/30/2016   S/P lumbar spinal fusion 12/13/2015   Aortic calcification (HCC) 07/18/2015   Controlled type 2 diabetes mellitus without complication (HCC) 05/16/2015   Thrombocytopenia (HCC) 05/16/2015   Recurrent major depressive disorder, in full remission (HCC) 05/16/2015   Essential (primary) hypertension 05/16/2015   Fatty infiltration of liver 03/15/2015   Aortic valve stenosis, nonrheumatic 01/18/2015   Sciatica of right side 01/09/2015   Type 2 diabetes mellitus (HCC) 11/10/2014   RLS (restless legs syndrome) 08/23/2014   Neuritis or radiculitis due to rupture of lumbar intervertebral disc 06/13/2014   Degeneration of intervertebral disc of lumbar region 06/13/2014   Lumbar radiculitis 06/13/2014   Arthritis 01/23/2014   Arthritis of knee, degenerative 11/15/2013   Anxiety and depression 09/29/2013   Insomnia 09/29/2013   OSA on CPAP 08/29/2013   History of nephrolithiasis 08/29/2013   Abnormal presence of protein in urine 08/29/2013   Headache, migraine 08/29/2013   BP (high blood pressure) 08/29/2013   HLD (hyperlipidemia) 08/29/2013   Allergic rhinitis 08/29/2013   Intractable migraine without aura 02/18/2013    REFERRING DIAG: Reverse right total shoulder arthroplasty with biceps tenodesis.    THERAPY DIAG:  Right shoulder pain, unspecified chronicity  Stiffness of right shoulder, not elsewhere classified  Rationale for Evaluation and Treatment Rehabilitation  PERTINENT HISTORY: S/P R reverse total shoulder replacement on 07/03/2022. Feels a little bit aggravated currently after taking a shower but usually goes away. Has had a couple of PT visits for home health. Exercises included pendulums and PROM.    PRECAUTIONS: PROM during initial weeks, no reaching behind back  Per BERT Daisy Blossom III, MD:  It should be safe for her to resume physical therapy on her shoulder, however.  Per Dr. Daniel Nones III, MD note on 09/17/2022: "She is medically stable to resume physical therapy immediately. "     SUBJECTIVE:   SUBJECTIVE STATEMENT: R shoulder is sore (4/10), bothering her a little bit. Bumped onto doorway the other day.    PAIN:  Are you having pain? See subjective   TODAY'S TREATMENT:  DATE: 09/25/2022  Therapeutic exercises  Currently following the [facility] Reverse Total Shoulder Replacement Protocol  Blood pressure, L arm sitting, mechanically taken, normal cuff: 121/60, HR 80  Seated R shoulder A/AROM with PT assist PRN  Flexion 10x3  Scaption 10x3  Abduction 10x3  Seated B scapular retraction yellow band 10x5 seconds, then 10x3  Standing shoulder isometrics  Flexion 10x5 seconds   Abduction 10x5 seconds   ER 10x5 seconds 2 sets  Seated IR 10x5 seconds  Seated R shoulder extension yellow band 10x3  Towel slides up wall shoulder AAROM  Flexion 10x5 seconds   Improved exercise technique, movement at target joints, use of target muscles after mod verbal, visual, tactile cues.      Response to treatment Fair tolerance to today's session   Clinical impression Pt currently 12 weeks post op. Continued working on A/AROM, as well as deltoid and scapular strength to promote ability to raise her R arm up more comfortably. Fair tolerance to today's session. Pt will benefit from continued skilled physical therapy services to improve ROM, strength, and function.        PATIENT EDUCATION: Education details: there-ex Person educated: Patient Education method: Explanation and Verbal cues Education comprehension: verbalized  understanding and returned demonstration  HOME EXERCISE PROGRAM: Access Code: [email or web address] URL: [email or web address] Date: 08/04/2022 Prepared by: Loralyn Freshwater  Exercises - Supine Shoulder Flexion AAROM with Hands Clasped  - 1 x daily - 7 x weekly - 3 sets - 10 reps  - Seated Shoulder Flexion AAROM with Pulley Behind  - 3 x daily - 7 x weekly - 3 sets - 10 reps - Seated Shoulder Abduction AAROM with Pulley Behind  - 3 x daily - 7 x weekly - 3 sets - 10 reps    PT Short Term Goals - 09/18/22 1048       PT SHORT TERM GOAL #1   Title Patient will be independent with her initial HEP to improve ROM and R UE function.    Baseline No questions wiht her HEP (09/18/2022)    Time 3    Period Weeks    Status Achieved    Target Date 08/14/22              PT Long Term Goals - 09/18/22 1104       PT LONG TERM GOAL #1   Title Pt will have a leat 120 degrees R shoulder flexion and abduction A/AROM to promote ability to reach and use her R UE for functional tasks.    Baseline R shoulder PROM 95 degrees flexion, 72 degrees abduction (07/22/2022); R shoulder AROM 123 degrees flexion, 107 degrees abduction (09/18/2022)    Time 12    Period Weeks    Status Partially Met    Target Date 10/16/22      PT LONG TERM GOAL #2   Title Pt will improve R shoulder ER A/AROM to at least 60 degrees to promote ability to reach behind her head.    Baseline R shoulder ER PROM in scapular plane: 33 degrees (07/22/2022); R shoulder in scapular plane: ER AAROM 41 degrees (09/18/2022)    Time 12    Period Weeks    Status Partially Met    Target Date 10/16/22      PT LONG TERM GOAL #3   Title Pt will have at least 4/5 R shoulder strength at all planes to promote ability to use her R UE for functional tasks.    Baseline R shoulder strength  not tested yet to allow for more healing (07/22/2022); R shoulder: 4+/5 flexion, 4+/5 abduction, 4/5 ER, 4/5 IR (09/18/2022)    Time 12    Period Weeks    Status  Achieved    Target Date 10/16/22      PT LONG TERM GOAL #4   Title Pt will improve R shoulder FOTO score to at least 50 as a demonstration of improved function.    Baseline R shoulder FOTO score 34 (07/22/2022)    Time 12    Period Weeks    Status New    Target Date 10/16/22              Plan - 09/25/22 1614     Clinical Impression Statement Pt currently 12 weeks post op. Continued working on A/AROM, as well as deltoid and scapular strength to promote ability to raise her R arm up more comfortably. Fair tolerance to today's session. Pt will benefit from continued skilled physical therapy services to improve ROM, strength, and function.    Personal Factors and Comorbidities Comorbidity 3+;Age;Past/Current Experience;Time since onset of injury/illness/exacerbation;Fitness    Comorbidities Anxiety, depression, HTN, DM, arthritis    Examination-Activity Limitations Bathing;Carry;Lift;Bed Mobility;Toileting;Dressing;Reach Overhead;Caring for Others;Self Feeding;Hygiene/Grooming    Stability/Clinical Decision Making Stable/Uncomplicated    Rehab Potential Fair    PT Frequency 2x / week    PT Duration 12 weeks    PT Treatment/Interventions Therapeutic activities;Therapeutic exercise;Neuromuscular re-education;Patient/family education;Manual techniques;Aquatic Therapy;Electrical Stimulation    PT Next Visit Plan PROM, manual techniques, modalities PRN    PT Home Exercise Plan Medbridge Access Code: [email or web address]    Consulted and Agree with Plan of Care Patient                Loralyn Freshwater PT, DPT  09/25/2022, 4:16 PM

## 2022-09-26 ENCOUNTER — Other Ambulatory Visit: Payer: Self-pay | Admitting: Internal Medicine

## 2022-09-26 DIAGNOSIS — R0789 Other chest pain: Secondary | ICD-10-CM

## 2022-09-29 NOTE — Progress Notes (Signed)
Patient presented with hypotension with broad shock differential. PTT ordered for further differentiation of etiology and as part of sepsis order set.

## 2022-09-30 ENCOUNTER — Telehealth: Payer: Self-pay

## 2022-09-30 ENCOUNTER — Ambulatory Visit: Payer: Medicare HMO

## 2022-09-30 DIAGNOSIS — M25511 Pain in right shoulder: Secondary | ICD-10-CM | POA: Diagnosis not present

## 2022-09-30 DIAGNOSIS — M25611 Stiffness of right shoulder, not elsewhere classified: Secondary | ICD-10-CM

## 2022-09-30 NOTE — Telephone Encounter (Signed)
No show. Called patient and left a message pertaining to appointment and a reminder for the next follow up session. Return phone call requested. Phone number ([phone]) provided.   

## 2022-09-30 NOTE — Therapy (Signed)
OUTPATIENT PHYSICAL THERAPY TREATMENT NOTE    Patient Name: Jessica Cole MRN: [id number] DOB:November 01, 1950, 72 y.o., female Today's Date: 09/30/2022  PCP: Lynnea Ferrier, MD  REFERRING PROVIDER: Anson Oregon, PA-C   END OF SESSION:  PT End of Session - 09/30/22 1430     Visit Number 13    Number of Visits 25    Date for PT Re-Evaluation 10/16/22    Progress Note Due on Visit 20    PT Start Time 1430    PT Stop Time 1503    PT Time Calculation (min) 33 min    Activity Tolerance Patient tolerated treatment well    Behavior During Therapy WFL for tasks assessed/performed                        Past Medical History:  Diagnosis Date   Anxiety    Asthma    seasonal with allergies   Colon polyps    Degenerative arthritis    Depression    Environmental allergies    Family history of adverse reaction to anesthesia    a.) PONV in 1st degree relative (sister)   Fatty liver    Glaucoma    History of bronchitis    History of kidney stones    History of pneumonia    Hypertension    Hypothyroidism    Migraines    Nose colonized with MRSA 02/14/2021   a.) preoperative PCR (+) on 02/14/2021 prior to LUMBAR MICRODISCECTOMY; b.) preoperative PCR (+) 06/24/2022 prior to REVERSE SHOULDER ARTHROPLASTY W/ BICEPS TENODESIS   Obesity    OSA on CPAP    Plantar fasciitis    right   Pneumonia    Renal calculi    Rheumatoid arthritis (HCC)    RLS (restless legs syndrome) 08/23/2014   T2DM (type 2 diabetes mellitus) (HCC)    Past Surgical History:  Procedure Laterality Date   ABDOMINAL HYSTERECTOMY     partial   APPENDECTOMY     Arthroscopic surgery, knee Left    BREAST BIOPSY Right    several   BREAST BIOPSY Right 03/11/2017   u/s bx neg   BUNIONECTOMY     LEFT    COLONOSCOPY W/ POLYPECTOMY     COLONOSCOPY WITH PROPOFOL N/A 08/14/2017   Procedure: COLONOSCOPY WITH PROPOFOL;  Surgeon: Christena Deem, MD;  Location: [facility] ENDOSCOPY;  Service:  Endoscopy;  Laterality: N/A;   EXTRACORPOREAL SHOCK WAVE LITHOTRIPSY Left 05/14/2017   Procedure: EXTRACORPOREAL SHOCK WAVE LITHOTRIPSY (ESWL);  Surgeon: Riki Altes, MD;  Location: [facility] ORS;  Service: Urology;  Laterality: Left;   EXTRACORPOREAL SHOCK WAVE LITHOTRIPSY Left 06/14/2020   Procedure: EXTRACORPOREAL SHOCK WAVE LITHOTRIPSY (ESWL);  Surgeon: Sondra Come, MD;  Location: [facility] ORS;  Service: Urology;  Laterality: Left;   FOOT SURGERY     RIGHT     KNEE ARTHROSCOPY W/ MENISCAL REPAIR Left    LASIK     LUMBAR LAMINECTOMY/DECOMPRESSION MICRODISCECTOMY Right 03/01/2021   Procedure: Microdiscectomy - Lumbar five-Sacral one - right;  Surgeon: Tia Alert, MD;  Location: [facility] OR;  Service: Neurosurgery;  Laterality: Right;   MAXIMUM ACCESS (MAS)POSTERIOR LUMBAR INTERBODY FUSION (PLIF) 1 LEVEL N/A 04/25/2016   Procedure: LUMBAR FOUR-FIVE  MAXIMUM ACCESS (MAS) POSTERIOR LUMBAR INTERBODY FUSION (PLIF) with extension of instrumentation LUMBAR TWO-FIVE;  Surgeon: Tia Alert, MD;  Location: [facility] OR;  Service: Neurosurgery;  Laterality: N/A;   MAXIMUM ACCESS (MAS)POSTERIOR LUMBAR INTERBODY FUSION (PLIF) 2  LEVEL N/A 12/13/2015   Procedure: Lumbar two-three - Lumbar three-four MAXIMUM ACCESS (MAS) POSTERIOR LUMBAR INTERBODY FUSION (PLIF)  ;  Surgeon: Tia Alert, MD;  Location: [facility] NEURO ORS;  Service: Neurosurgery;  Laterality: N/A;   REVERSE SHOULDER ARTHROPLASTY Left 03/01/2020   Procedure: REVERSE SHOULDER ARTHROPLASTY;  Surgeon: Christena Flake, MD;  Location: [facility] ORS;  Service: Orthopedics;  Laterality: Left;   REVERSE SHOULDER ARTHROPLASTY Right 07/03/2022   Procedure: REVERSE SHOULDER ARTHROPLASTY W/ BICEPS TENODESIS.;  Surgeon: Christena Flake, MD;  Location: [facility] ORS;  Service: Orthopedics;  Laterality: Right;   SHOULDER SURGERY     RIGHT    TONSILLECTOMY     Patient Active Problem List   Diagnosis Date Noted   Hypotension 09/15/2022   Lactic acidosis 09/15/2022   Syncope  08/19/2022   S/P lumbar laminectomy 03/01/2021   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) 01/03/2019   Chronic cough 11/19/2016   Morbid obesity (HCC) 09/26/2016   Left ovarian cyst 05/30/2016   S/P lumbar spinal fusion 12/13/2015   Aortic calcification (HCC) 07/18/2015   Controlled type 2 diabetes mellitus without complication (HCC) 05/16/2015   Thrombocytopenia (HCC) 05/16/2015   Recurrent major depressive disorder, in full remission (HCC) 05/16/2015   Essential (primary) hypertension 05/16/2015   Fatty infiltration of liver 03/15/2015   Aortic valve stenosis, nonrheumatic 01/18/2015   Sciatica of right side 01/09/2015   Type 2 diabetes mellitus (HCC) 11/10/2014   RLS (restless legs syndrome) 08/23/2014   Neuritis or radiculitis due to rupture of lumbar intervertebral disc 06/13/2014   Degeneration of intervertebral disc of lumbar region 06/13/2014   Lumbar radiculitis 06/13/2014   Arthritis 01/23/2014   Arthritis of knee, degenerative 11/15/2013   Anxiety and depression 09/29/2013   Insomnia 09/29/2013   OSA on CPAP 08/29/2013   History of nephrolithiasis 08/29/2013   Abnormal presence of protein in urine 08/29/2013   Headache, migraine 08/29/2013   BP (high blood pressure) 08/29/2013   HLD (hyperlipidemia) 08/29/2013   Allergic rhinitis 08/29/2013   Intractable migraine without aura 02/18/2013    REFERRING DIAG: Reverse right total shoulder arthroplasty with biceps tenodesis.    THERAPY DIAG:  Right shoulder pain, unspecified chronicity  Stiffness of right shoulder, not elsewhere classified  Rationale for Evaluation and Treatment Rehabilitation  PERTINENT HISTORY: S/P R reverse total shoulder replacement on 07/03/2022. Feels a little bit aggravated currently after taking a shower but usually goes away. Has had a couple of PT visits for home health. Exercises included pendulums and PROM.    PRECAUTIONS: PROM during initial weeks, no reaching behind back  Per BERT Daisy Blossom III, MD:  It should be safe for her to resume physical therapy on her shoulder, however.  Per Dr. Daniel Nones III, MD note on 09/17/2022: "She is medically stable to resume physical therapy immediately. "     SUBJECTIVE:   SUBJECTIVE STATEMENT: R shoulder is pretty good. Was able to do her pulley exercises at home. No pain currently.    PAIN:  Are you having pain? See subjective   TODAY'S TREATMENT:  DATE: 09/30/2022  Therapeutic exercises  Currently following the [facility] Reverse Total Shoulder Replacement Protocol  Blood pressure, L arm sitting, mechanically taken, normal cuff: 104/61, HR 81 No light headedness or dizziness  Seated R shoulder AROM  Flexion 10x3  Scaption 10x3  Abduction 10x3  Seated B scapular retraction yellow band 10x5 seconds for 2 sets, then 10x no holds  Seated R shoulder ER yellow band 10x  Seated R shoulder IR yellow band 10x  Seated biceps curls yellow band   R 10x3  Seated R shoulder extension yellow band 10x3   Towel slides up wall shoulder AAROM  Flexion 10x5 seconds   Hot flashes per pt afterwards  Blood pressure, L arm sitting, mechanically taken, normal cuff: 103/69, HR 75    Improved exercise technique, movement at target joints, use of target muscles after mod verbal, visual, tactile cues.      Response to treatment Pt tolerated session well without aggravation of symptoms.     Clinical impression Pt currently 12 - 13 weeks post op. Worked on AROM, as well as rotator cuff and scapular strength to promote ability to raise her R arm up more comfortably. Pt tolerated session well without aggravation of symptoms. Pt will benefit from continued skilled physical therapy services to improve ROM,  strength, and function.        PATIENT EDUCATION: Education details: there-ex Person educated: Patient Education method: Explanation and Verbal cues Education comprehension: verbalized understanding and returned demonstration  HOME EXERCISE PROGRAM: Access Code: [email or web address] URL: [email or web address] Date: 08/04/2022 Prepared by: Loralyn Freshwater  Exercises - Supine Shoulder Flexion AAROM with Hands Clasped  - 1 x daily - 7 x weekly - 3 sets - 10 reps  - Seated Shoulder Flexion AAROM with Pulley Behind  - 3 x daily - 7 x weekly - 3 sets - 10 reps - Seated Shoulder Abduction AAROM with Pulley Behind  - 3 x daily - 7 x weekly - 3 sets - 10 reps    PT Short Term Goals - 09/18/22 1048       PT SHORT TERM GOAL #1   Title Patient will be independent with her initial HEP to improve ROM and R UE function.    Baseline No questions wiht her HEP (09/18/2022)    Time 3    Period Weeks    Status Achieved    Target Date 08/14/22              PT Long Term Goals - 09/18/22 1104       PT LONG TERM GOAL #1   Title Pt will have a leat 120 degrees R shoulder flexion and abduction A/AROM to promote ability to reach and use her R UE for functional tasks.    Baseline R shoulder PROM 95 degrees flexion, 72 degrees abduction (07/22/2022); R shoulder AROM 123 degrees flexion, 107 degrees abduction (09/18/2022)    Time 12    Period Weeks    Status Partially Met    Target Date 10/16/22      PT LONG TERM GOAL #2   Title Pt will improve R shoulder ER A/AROM to at least 60 degrees to promote ability to reach behind her head.    Baseline R shoulder ER PROM in scapular plane: 33 degrees (07/22/2022); R shoulder in scapular plane: ER AAROM 41 degrees (09/18/2022)    Time 12    Period Weeks    Status Partially Met    Target Date 10/16/22  PT LONG TERM GOAL #3   Title Pt will have at least 4/5 R shoulder strength at all planes to promote ability to use her R UE for functional tasks.     Baseline R shoulder strength not tested yet to allow for more healing (07/22/2022); R shoulder: 4+/5 flexion, 4+/5 abduction, 4/5 ER, 4/5 IR (09/18/2022)    Time 12    Period Weeks    Status Achieved    Target Date 10/16/22      PT LONG TERM GOAL #4   Title Pt will improve R shoulder FOTO score to at least 50 as a demonstration of improved function.    Baseline R shoulder FOTO score 34 (07/22/2022)    Time 12    Period Weeks    Status New    Target Date 10/16/22              Plan - 09/30/22 1426     Clinical Impression Statement Pt currently 12 - 13 weeks post op. Worked on AROM, as well as rotator cuff and scapular strength to promote ability to raise her R arm up more comfortably. Pt tolerated session well without aggravation of symptoms. Pt will benefit from continued skilled physical therapy services to improve ROM, strength, and function.    Personal Factors and Comorbidities Comorbidity 3+;Age;Past/Current Experience;Time since onset of injury/illness/exacerbation;Fitness    Comorbidities Anxiety, depression, HTN, DM, arthritis    Examination-Activity Limitations Bathing;Carry;Lift;Bed Mobility;Toileting;Dressing;Reach Overhead;Caring for Others;Self Feeding;Hygiene/Grooming    Stability/Clinical Decision Making Stable/Uncomplicated    Rehab Potential Fair    PT Frequency 2x / week    PT Duration 12 weeks    PT Treatment/Interventions Therapeutic activities;Therapeutic exercise;Neuromuscular re-education;Patient/family education;Manual techniques;Aquatic Therapy;Electrical Stimulation    PT Next Visit Plan PROM, manual techniques, modalities PRN    PT Home Exercise Plan Medbridge Access Code: [email or web address]    Consulted and Agree with Plan of Care Patient                Loralyn Freshwater PT, DPT  09/30/2022, 3:09 PM

## 2022-10-02 ENCOUNTER — Ambulatory Visit: Payer: Medicare HMO

## 2022-10-02 DIAGNOSIS — M25511 Pain in right shoulder: Secondary | ICD-10-CM

## 2022-10-02 DIAGNOSIS — M25611 Stiffness of right shoulder, not elsewhere classified: Secondary | ICD-10-CM

## 2022-10-02 NOTE — Therapy (Signed)
OUTPATIENT PHYSICAL THERAPY TREATMENT NOTE    Patient Name: Jessica Cole MRN: [id number] DOB:[birth year], 72 y.o., female Today's Date: 10/02/2022  PCP: Lynnea Ferrier, MD  REFERRING PROVIDER: Anson [place], PA-C   END OF SESSION:  PT End of Session - 10/02/22 1346     Visit Number 14    Number of Visits 25    Date for PT Re-Evaluation 10/16/22    Progress Note Due on Visit 20    PT Start Time 1346    PT Stop Time 1427    PT Time Calculation (min) 41 min    Activity Tolerance Patient tolerated treatment well    Behavior During Therapy WFL for tasks assessed/performed                         Past Medical History:  Diagnosis Date   Anxiety    Asthma    seasonal with allergies   Colon polyps    Degenerative arthritis    Depression    Environmental allergies    Family history of adverse reaction to anesthesia    a.) PONV in 1st degree relative (sister)   Fatty liver    Glaucoma    History of bronchitis    History of kidney stones    History of pneumonia    Hypertension    Hypothyroidism    Migraines    Nose colonized with MRSA 02/14/2021   a.) preoperative PCR (+) on 02/14/2021 prior to LUMBAR MICRODISCECTOMY; b.) preoperative PCR (+) 06/24/2022 prior to REVERSE SHOULDER ARTHROPLASTY W/ BICEPS TENODESIS   Obesity    OSA on CPAP    Plantar fasciitis    right   Pneumonia    Renal calculi    Rheumatoid arthritis (HCC)    RLS (restless legs syndrome) 08/23/2014   T2DM (type 2 diabetes mellitus) (HCC)    Past Surgical History:  Procedure Laterality Date   ABDOMINAL HYSTERECTOMY     partial   APPENDECTOMY     Arthroscopic surgery, knee Left    BREAST BIOPSY Right    several   BREAST BIOPSY Right 03/11/2017   u/s bx neg   BUNIONECTOMY     LEFT    COLONOSCOPY W/ POLYPECTOMY     COLONOSCOPY WITH PROPOFOL N/A 08/14/2017   Procedure: COLONOSCOPY WITH PROPOFOL;  Surgeon: Christena Deem, MD;  Location: [facility] ENDOSCOPY;  Service:  Endoscopy;  Laterality: N/A;   EXTRACORPOREAL SHOCK WAVE LITHOTRIPSY Left 05/14/2017   Procedure: EXTRACORPOREAL SHOCK WAVE LITHOTRIPSY (ESWL);  Surgeon: Riki Altes, MD;  Location: [facility] ORS;  Service: Urology;  Laterality: Left;   EXTRACORPOREAL SHOCK WAVE LITHOTRIPSY Left 06/14/2020   Procedure: EXTRACORPOREAL SHOCK WAVE LITHOTRIPSY (ESWL);  Surgeon: Sondra Come, MD;  Location: [facility] ORS;  Service: Urology;  Laterality: Left;   FOOT SURGERY     RIGHT     KNEE ARTHROSCOPY W/ MENISCAL REPAIR Left    LASIK     LUMBAR LAMINECTOMY/DECOMPRESSION MICRODISCECTOMY Right 03/01/2021   Procedure: Microdiscectomy - Lumbar five-Sacral one - right;  Surgeon: Tia Alert, MD;  Location: [facility] OR;  Service: Neurosurgery;  Laterality: Right;   MAXIMUM ACCESS (MAS)POSTERIOR LUMBAR INTERBODY FUSION (PLIF) 1 LEVEL N/A 04/25/2016   Procedure: LUMBAR FOUR-FIVE  MAXIMUM ACCESS (MAS) POSTERIOR LUMBAR INTERBODY FUSION (PLIF) with extension of instrumentation LUMBAR TWO-FIVE;  Surgeon: Tia Alert, MD;  Location: [facility] OR;  Service: Neurosurgery;  Laterality: N/A;   MAXIMUM ACCESS (MAS)POSTERIOR LUMBAR INTERBODY FUSION (PLIF)  2 LEVEL N/A 12/13/2015   Procedure: Lumbar two-three - Lumbar three-four MAXIMUM ACCESS (MAS) POSTERIOR LUMBAR INTERBODY FUSION (PLIF)  ;  Surgeon: Tia Alert, MD;  Location: [facility] NEURO ORS;  Service: Neurosurgery;  Laterality: N/A;   REVERSE SHOULDER ARTHROPLASTY Left 03/01/2020   Procedure: REVERSE SHOULDER ARTHROPLASTY;  Surgeon: Christena Flake, MD;  Location: [facility] ORS;  Service: Orthopedics;  Laterality: Left;   REVERSE SHOULDER ARTHROPLASTY Right 07/03/2022   Procedure: REVERSE SHOULDER ARTHROPLASTY W/ BICEPS TENODESIS.;  Surgeon: Christena Flake, MD;  Location: [facility] ORS;  Service: Orthopedics;  Laterality: Right;   SHOULDER SURGERY     RIGHT    TONSILLECTOMY     Patient Active Problem List   Diagnosis Date Noted   Hypotension 09/15/2022   Lactic acidosis 09/15/2022   Syncope  08/19/2022   S/P lumbar laminectomy 03/01/2021   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) 01/03/2019   Chronic cough 11/19/2016   Morbid obesity (HCC) 09/26/2016   Left ovarian cyst 05/30/2016   S/P lumbar spinal fusion 12/13/2015   Aortic calcification (HCC) 07/18/2015   Controlled type 2 diabetes mellitus without complication (HCC) 05/16/2015   Thrombocytopenia (HCC) 05/16/2015   Recurrent major depressive disorder, in full remission (HCC) 05/16/2015   Essential (primary) hypertension 05/16/2015   Fatty infiltration of liver 03/15/2015   Aortic valve stenosis, nonrheumatic 01/18/2015   Sciatica of right side 01/09/2015   Type 2 diabetes mellitus (HCC) 11/10/2014   RLS (restless legs syndrome) 08/23/2014   Neuritis or radiculitis due to rupture of lumbar intervertebral disc 06/13/2014   Degeneration of intervertebral disc of lumbar region 06/13/2014   Lumbar radiculitis 06/13/2014   Arthritis 01/23/2014   Arthritis of knee, degenerative 11/15/2013   Anxiety and depression 09/29/2013   Insomnia 09/29/2013   OSA on CPAP 08/29/2013   History of nephrolithiasis 08/29/2013   Abnormal presence of protein in urine 08/29/2013   Headache, migraine 08/29/2013   BP (high blood pressure) 08/29/2013   HLD (hyperlipidemia) 08/29/2013   Allergic rhinitis 08/29/2013   Intractable migraine without aura 02/18/2013    REFERRING DIAG: Reverse right total shoulder arthroplasty with biceps tenodesis.    THERAPY DIAG:  Right shoulder pain, unspecified chronicity  Stiffness of right shoulder, not elsewhere classified  Rationale for Evaluation and Treatment Rehabilitation  PERTINENT HISTORY: S/P R reverse total shoulder replacement on 07/03/2022. Feels a little bit aggravated currently after taking a shower but usually goes away. Has had a couple of PT visits for home health. Exercises included pendulums and PROM.    PRECAUTIONS: PROM during initial weeks, no reaching behind back  Per BERT Daisy Blossom III, MD:  It should be safe for her to resume physical therapy on her shoulder, however.  Per Dr. Daniel Nones III, MD note on 09/17/2022: "She is medically stable to resume physical therapy immediately. "   No latex allergies   SUBJECTIVE:   SUBJECTIVE STATEMENT: R shoulder is a little sore, not bad sore, 1/10 currently.      PAIN:  Are you having pain? See subjective     TODAY'S TREATMENT:  DATE: 10/02/2022  Therapeutic exercises  Currently following the [facility] Reverse Total Shoulder Replacement Protocol  Blood pressure, L arm sitting, mechanically taken, normal cuff: 136/72, HR 77 No light headedness or dizziness  Seated R shoulder AROM  Flexion 10x3  Scaption 10x3  Abduction 10x3  Seated R shoulder extension yellow band 10x3  Seated R shoulder ER with yellow band 3x.  Seated R shoulder ER AAROM with PT 10x5 seconds for 3 sets  Seated R shoulder IR isometrics with PT manual resistance 10x5 seconds for 3 sets  Seated B scapular retraction yellow band 10x3   Seated R shoulder ER with yellow band 5x2   Seated biceps curls yellow band   R 10x2   Towel slides up wall shoulder AAROM  Flexion 10x5 seconds      Improved exercise technique, movement at target joints, use of target muscles after mod verbal, visual, tactile cues.      Response to treatment Pt tolerated session well without aggravation of symptoms.     Clinical impression Pt currently 13 weeks post op. Continued working on AROM, as well as rotator cuff and scapular strength to promote ability to raise her R arm up more comfortably. Pt tolerated session well without aggravation of symptoms. Pt will benefit from continued skilled physical therapy services to  improve ROM, strength, and function.        PATIENT EDUCATION: Education details: there-ex Person educated: Patient Education method: Explanation and Verbal cues Education comprehension: verbalized understanding and returned demonstration  HOME EXERCISE PROGRAM: Access Code: [email or web address] URL: [email or web address] Date: 08/04/2022 Prepared by: Loralyn Freshwater  Exercises - Supine Shoulder Flexion AAROM with Hands Clasped  - 1 x daily - 7 x weekly - 3 sets - 10 reps  - Seated Shoulder Flexion AAROM with Pulley Behind  - 3 x daily - 7 x weekly - 3 sets - 10 reps - Seated Shoulder Abduction AAROM with Pulley Behind  - 3 x daily - 7 x weekly - 3 sets - 10 reps - Scapular Retraction with Resistance  - 1 x daily - 7 x weekly - 3 sets - 10 reps  Yellow band   PT Short Term Goals - 09/18/22 1048       PT SHORT TERM GOAL #1   Title Patient will be independent with her initial HEP to improve ROM and R UE function.    Baseline No questions wiht her HEP (09/18/2022)    Time 3    Period Weeks    Status Achieved    Target Date 08/14/22              PT Long Term Goals - 09/18/22 1104       PT LONG TERM GOAL #1   Title Pt will have a leat 120 degrees R shoulder flexion and abduction A/AROM to promote ability to reach and use her R UE for functional tasks.    Baseline R shoulder PROM 95 degrees flexion, 72 degrees abduction (07/22/2022); R shoulder AROM 123 degrees flexion, 107 degrees abduction (09/18/2022)    Time 12    Period Weeks    Status Partially Met    Target Date 10/16/22      PT LONG TERM GOAL #2   Title Pt will improve R shoulder ER A/AROM to at least 60 degrees to promote ability to reach behind her head.    Baseline R shoulder ER PROM in scapular plane: 33 degrees (07/22/2022); R shoulder in scapular plane: ER AAROM 41 degrees (  09/18/2022)    Time 12    Period Weeks    Status Partially Met    Target Date 10/16/22      PT LONG TERM GOAL #3   Title Pt will  have at least 4/5 R shoulder strength at all planes to promote ability to use her R UE for functional tasks.    Baseline R shoulder strength not tested yet to allow for more healing (07/22/2022); R shoulder: 4+/5 flexion, 4+/5 abduction, 4/5 ER, 4/5 IR (09/18/2022)    Time 12    Period Weeks    Status Achieved    Target Date 10/16/22      PT LONG TERM GOAL #4   Title Pt will improve R shoulder FOTO score to at least 50 as a demonstration of improved function.    Baseline R shoulder FOTO score 34 (07/22/2022)    Time 12    Period Weeks    Status New    Target Date 10/16/22              Plan - 10/02/22 1343     Clinical Impression Statement Pt currently 13 weeks post op. Continued working on AROM, as well as rotator cuff and scapular strength to promote ability to raise her R arm up more comfortably. Pt tolerated session well without aggravation of symptoms. Pt will benefit from continued skilled physical therapy services to improve ROM, strength, and function.    Personal Factors and Comorbidities Comorbidity 3+;Age;Past/Current Experience;Time since onset of injury/illness/exacerbation;Fitness    Comorbidities Anxiety, depression, HTN, DM, arthritis    Examination-Activity Limitations Bathing;Carry;Lift;Bed Mobility;Toileting;Dressing;Reach Overhead;Caring for Others;Self Feeding;Hygiene/Grooming    Stability/Clinical Decision Making Stable/Uncomplicated    Rehab Potential Fair    PT Frequency 2x / week    PT Duration 12 weeks    PT Treatment/Interventions Therapeutic activities;Therapeutic exercise;Neuromuscular re-education;Patient/family education;Manual techniques;Aquatic Therapy;Electrical Stimulation    PT Next Visit Plan PROM, manual techniques, modalities PRN    PT Home Exercise Plan Medbridge Access Code: [email or web address]    Consulted and Agree with Plan of Care Patient                Loralyn Freshwater PT, DPT  10/02/2022, 2:30 PM

## 2022-10-07 ENCOUNTER — Ambulatory Visit: Payer: Medicare HMO | Attending: Student

## 2022-10-07 DIAGNOSIS — M6281 Muscle weakness (generalized): Secondary | ICD-10-CM | POA: Insufficient documentation

## 2022-10-07 DIAGNOSIS — M25611 Stiffness of right shoulder, not elsewhere classified: Secondary | ICD-10-CM | POA: Insufficient documentation

## 2022-10-07 DIAGNOSIS — M25511 Pain in right shoulder: Secondary | ICD-10-CM | POA: Diagnosis present

## 2022-10-07 NOTE — Therapy (Signed)
OUTPATIENT PHYSICAL THERAPY TREATMENT NOTE    Patient Name: Jessica Cole MRN: [id number] DOB:1950/09/05, 72 y.o., female Today's Date: 10/07/2022  PCP: Lynnea Ferrier, MD  REFERRING PROVIDER: Anson [place], PA-C   END OF SESSION:  PT End of Session - 10/07/22 1436     Visit Number 15    Number of Visits 25    Date for PT Re-Evaluation 10/16/22    Progress Note Due on Visit 20    PT Start Time 1436    PT Stop Time 1516    PT Time Calculation (min) 40 min    Activity Tolerance Patient tolerated treatment well    Behavior During Therapy WFL for tasks assessed/performed                          Past Medical History:  Diagnosis Date   Anxiety    Asthma    seasonal with allergies   Colon polyps    Degenerative arthritis    Depression    Environmental allergies    Family history of adverse reaction to anesthesia    a.) PONV in 1st degree relative (sister)   Fatty liver    Glaucoma    History of bronchitis    History of kidney stones    History of pneumonia    Hypertension    Hypothyroidism    Migraines    Nose colonized with MRSA 02/14/2021   a.) preoperative PCR (+) on 02/14/2021 prior to LUMBAR MICRODISCECTOMY; b.) preoperative PCR (+) 06/24/2022 prior to REVERSE SHOULDER ARTHROPLASTY W/ BICEPS TENODESIS   Obesity    OSA on CPAP    Plantar fasciitis    right   Pneumonia    Renal calculi    Rheumatoid arthritis (HCC)    RLS (restless legs syndrome) 08/23/2014   T2DM (type 2 diabetes mellitus) (HCC)    Past Surgical History:  Procedure Laterality Date   ABDOMINAL HYSTERECTOMY     partial   APPENDECTOMY     Arthroscopic surgery, knee Left    BREAST BIOPSY Right    several   BREAST BIOPSY Right 03/11/2017   u/s bx neg   BUNIONECTOMY     LEFT    COLONOSCOPY W/ POLYPECTOMY     COLONOSCOPY WITH PROPOFOL N/A 08/14/2017   Procedure: COLONOSCOPY WITH PROPOFOL;  Surgeon: Christena Deem, MD;  Location: [facility] ENDOSCOPY;  Service:  Endoscopy;  Laterality: N/A;   EXTRACORPOREAL SHOCK WAVE LITHOTRIPSY Left 05/14/2017   Procedure: EXTRACORPOREAL SHOCK WAVE LITHOTRIPSY (ESWL);  Surgeon: Riki Altes, MD;  Location: [facility] ORS;  Service: Urology;  Laterality: Left;   EXTRACORPOREAL SHOCK WAVE LITHOTRIPSY Left 06/14/2020   Procedure: EXTRACORPOREAL SHOCK WAVE LITHOTRIPSY (ESWL);  Surgeon: Sondra Come, MD;  Location: [facility] ORS;  Service: Urology;  Laterality: Left;   FOOT SURGERY     RIGHT     KNEE ARTHROSCOPY W/ MENISCAL REPAIR Left    LASIK     LUMBAR LAMINECTOMY/DECOMPRESSION MICRODISCECTOMY Right 03/01/2021   Procedure: Microdiscectomy - Lumbar five-Sacral one - right;  Surgeon: Tia Alert, MD;  Location: [facility] OR;  Service: Neurosurgery;  Laterality: Right;   MAXIMUM ACCESS (MAS)POSTERIOR LUMBAR INTERBODY FUSION (PLIF) 1 LEVEL N/A 04/25/2016   Procedure: LUMBAR FOUR-FIVE  MAXIMUM ACCESS (MAS) POSTERIOR LUMBAR INTERBODY FUSION (PLIF) with extension of instrumentation LUMBAR TWO-FIVE;  Surgeon: Tia Alert, MD;  Location: [facility] OR;  Service: Neurosurgery;  Laterality: N/A;   MAXIMUM ACCESS (MAS)POSTERIOR LUMBAR INTERBODY FUSION (  PLIF) 2 LEVEL N/A 12/13/2015   Procedure: Lumbar two-three - Lumbar three-four MAXIMUM ACCESS (MAS) POSTERIOR LUMBAR INTERBODY FUSION (PLIF)  ;  Surgeon: Tia Alert, MD;  Location: [facility] NEURO ORS;  Service: Neurosurgery;  Laterality: N/A;   REVERSE SHOULDER ARTHROPLASTY Left 03/01/2020   Procedure: REVERSE SHOULDER ARTHROPLASTY;  Surgeon: Christena Flake, MD;  Location: [facility] ORS;  Service: Orthopedics;  Laterality: Left;   REVERSE SHOULDER ARTHROPLASTY Right 07/03/2022   Procedure: REVERSE SHOULDER ARTHROPLASTY W/ BICEPS TENODESIS.;  Surgeon: Christena Flake, MD;  Location: [facility] ORS;  Service: Orthopedics;  Laterality: Right;   SHOULDER SURGERY     RIGHT    TONSILLECTOMY     Patient Active Problem List   Diagnosis Date Noted   Hypotension 09/15/2022   Lactic acidosis 09/15/2022   Syncope  08/19/2022   S/P lumbar laminectomy 03/01/2021   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) 01/03/2019   Chronic cough 11/19/2016   Morbid obesity (HCC) 09/26/2016   Left ovarian cyst 05/30/2016   S/P lumbar spinal fusion 12/13/2015   Aortic calcification (HCC) 07/18/2015   Controlled type 2 diabetes mellitus without complication (HCC) 05/16/2015   Thrombocytopenia (HCC) 05/16/2015   Recurrent major depressive disorder, in full remission (HCC) 05/16/2015   Essential (primary) hypertension 05/16/2015   Fatty infiltration of liver 03/15/2015   Aortic valve stenosis, nonrheumatic 01/18/2015   Sciatica of right side 01/09/2015   Type 2 diabetes mellitus (HCC) 11/10/2014   RLS (restless legs syndrome) 08/23/2014   Neuritis or radiculitis due to rupture of lumbar intervertebral disc 06/13/2014   Degeneration of intervertebral disc of lumbar region 06/13/2014   Lumbar radiculitis 06/13/2014   Arthritis 01/23/2014   Arthritis of knee, degenerative 11/15/2013   Anxiety and depression 09/29/2013   Insomnia 09/29/2013   OSA on CPAP 08/29/2013   History of nephrolithiasis 08/29/2013   Abnormal presence of protein in urine 08/29/2013   Headache, migraine 08/29/2013   BP (high blood pressure) 08/29/2013   HLD (hyperlipidemia) 08/29/2013   Allergic rhinitis 08/29/2013   Intractable migraine without aura 02/18/2013    REFERRING DIAG: Reverse right total shoulder arthroplasty with biceps tenodesis.    THERAPY DIAG:  Right shoulder pain, unspecified chronicity  Stiffness of right shoulder, not elsewhere classified  Rationale for Evaluation and Treatment Rehabilitation  PERTINENT HISTORY: S/P R reverse total shoulder replacement on 07/03/2022. Feels a little bit aggravated currently after taking a shower but usually goes away. Has had a couple of PT visits for home health. Exercises included pendulums and PROM.    PRECAUTIONS: PROM during initial weeks, no reaching behind back  Per BERT Daisy Blossom III, MD:  It should be safe for her to resume physical therapy on her shoulder, however.  Per Dr. Daniel Nones III, MD note on 09/17/2022: "She is medically stable to resume physical therapy immediately. "   No latex allergies   SUBJECTIVE:   SUBJECTIVE STATEMENT: Low back is bothering her currently and all night yesterday. R shoulder is a little sore, pt accidentally slipped at her tub and hit her R shoulder and arm onto the wall 3-4 days ago. Soreness has improved since then. 6/10 R shoulder pain currently.      PAIN:  Are you having pain? See subjective     TODAY'S TREATMENT:  DATE: 10/07/2022  Therapeutic exercises  Currently following the [facility] Reverse Total Shoulder Replacement Protocol  Blood pressure, L arm sitting, mechanically taken, normal cuff: 111/60, HR 97 No light headedness or dizziness  Seated R shoulder AAROM with PT  Flexion 10x3  Scaption 10x3    End range shoulder flexion and scaption increases low back pain  Seated gentle manually resisted L trunk side bend isometrics in neutral to improve low back posture 10x5 seconds, then 8x5 seconds. Improved symptoms initially but increased symptoms towards the end of the second set.   Performed to promote comfort with R shoulder exercises.   Seated R shoulder ER AROM with 10x5 seconds for 3 sets   Seated R shoulder IR isometrics with PT manual resistance 10x5 seconds for 2 sets  Seated R shoulder ER with yellow band 5x, then 10x  Blood pressure L arm sitting, mechanically taken, normal cuff: 104/65, HR 84    Improved exercise technique, movement at target joints, use of target muscles after mod verbal, visual, tactile cues.      Response to treatment Pt tolerated  session well without aggravation of symptoms.     Clinical impression Pt currently 13-14 weeks post op. Continued working on ROM, as well as rotator cuff strength to promote ability to raise her R arm up more comfortably. Pt tolerated session well without aggravation of shoulder symptoms. Pt will benefit from continued skilled physical therapy services to improve ROM, strength, and function.        PATIENT EDUCATION: Education details: there-ex Person educated: Patient Education method: Explanation and Verbal cues Education comprehension: verbalized understanding and returned demonstration  HOME EXERCISE PROGRAM: Access Code: [email or web address] URL: [email or web address] Date: 08/04/2022 Prepared by: Loralyn Freshwater  Exercises - Supine Shoulder Flexion AAROM with Hands Clasped  - 1 x daily - 7 x weekly - 3 sets - 10 reps  - Seated Shoulder Flexion AAROM with Pulley Behind  - 3 x daily - 7 x weekly - 3 sets - 10 reps - Seated Shoulder Abduction AAROM with Pulley Behind  - 3 x daily - 7 x weekly - 3 sets - 10 reps - Scapular Retraction with Resistance  - 1 x daily - 7 x weekly - 3 sets - 10 reps  Yellow band   PT Short Term Goals - 09/18/22 1048       PT SHORT TERM GOAL #1   Title Patient will be independent with her initial HEP to improve ROM and R UE function.    Baseline No questions wiht her HEP (09/18/2022)    Time 3    Period Weeks    Status Achieved    Target Date 08/14/22              PT Long Term Goals - 09/18/22 1104       PT LONG TERM GOAL #1   Title Pt will have a leat 120 degrees R shoulder flexion and abduction A/AROM to promote ability to reach and use her R UE for functional tasks.    Baseline R shoulder PROM 95 degrees flexion, 72 degrees abduction (07/22/2022); R shoulder AROM 123 degrees flexion, 107 degrees abduction (09/18/2022)    Time 12    Period Weeks    Status Partially Met    Target Date 10/16/22      PT LONG TERM GOAL #2   Title Pt  will improve R shoulder ER A/AROM to at least 60 degrees to promote ability to reach behind her head.  Baseline R shoulder ER PROM in scapular plane: 33 degrees (07/22/2022); R shoulder in scapular plane: ER AAROM 41 degrees (09/18/2022)    Time 12    Period Weeks    Status Partially Met    Target Date 10/16/22      PT LONG TERM GOAL #3   Title Pt will have at least 4/5 R shoulder strength at all planes to promote ability to use her R UE for functional tasks.    Baseline R shoulder strength not tested yet to allow for more healing (07/22/2022); R shoulder: 4+/5 flexion, 4+/5 abduction, 4/5 ER, 4/5 IR (09/18/2022)    Time 12    Period Weeks    Status Achieved    Target Date 10/16/22      PT LONG TERM GOAL #4   Title Pt will improve R shoulder FOTO score to at least 50 as a demonstration of improved function.    Baseline R shoulder FOTO score 34 (07/22/2022)    Time 12    Period Weeks    Status New    Target Date 10/16/22              Plan - 10/07/22 1436     Clinical Impression Statement Pt currently 13-14 weeks post op. Continued working on ROM, as well as rotator cuff strength to promote ability to raise her R arm up more comfortably. Pt tolerated session well without aggravation of shoulder symptoms. Pt will benefit from continued skilled physical therapy services to improve ROM, strength, and function.    Personal Factors and Comorbidities Comorbidity 3+;Age;Past/Current Experience;Time since onset of injury/illness/exacerbation;Fitness    Comorbidities Anxiety, depression, HTN, DM, arthritis    Examination-Activity Limitations Bathing;Carry;Lift;Bed Mobility;Toileting;Dressing;Reach Overhead;Caring for Others;Self Feeding;Hygiene/Grooming    Stability/Clinical Decision Making Stable/Uncomplicated    Rehab Potential Fair    PT Frequency 2x / week    PT Duration 12 weeks    PT Treatment/Interventions Therapeutic activities;Therapeutic exercise;Neuromuscular  re-education;Patient/family education;Manual techniques;Aquatic Therapy;Electrical Stimulation    PT Next Visit Plan PROM, manual techniques, modalities PRN    PT Home Exercise Plan Medbridge Access Code: [email or web address]    Consulted and Agree with Plan of Care Patient                Loralyn Freshwater PT, DPT  10/07/2022, 4:20 PM

## 2022-10-09 ENCOUNTER — Ambulatory Visit: Payer: Medicare HMO

## 2022-10-09 DIAGNOSIS — M25511 Pain in right shoulder: Secondary | ICD-10-CM

## 2022-10-09 DIAGNOSIS — M25611 Stiffness of right shoulder, not elsewhere classified: Secondary | ICD-10-CM

## 2022-10-09 NOTE — Therapy (Signed)
OUTPATIENT PHYSICAL THERAPY TREATMENT NOTE    Patient Name: Jessica Cole MRN: [id number] DOB:1950-08-11, 72 y.o., female Today's Date: 10/09/2022  PCP: Lynnea Ferrier, MD  REFERRING PROVIDER: Anson Oregon, PA-C   END OF SESSION:  PT End of Session - 10/09/22 1443     Visit Number 16    Number of Visits 25    Date for PT Re-Evaluation 10/16/22    Progress Note Due on Visit 20    PT Start Time 1445    PT Stop Time 1527    PT Time Calculation (min) 42 min    Activity Tolerance Patient tolerated treatment well    Behavior During Therapy WFL for tasks assessed/performed                          Past Medical History:  Diagnosis Date   Anxiety    Asthma    seasonal with allergies   Colon polyps    Degenerative arthritis    Depression    Environmental allergies    Family history of adverse reaction to anesthesia    a.) PONV in 1st degree relative (sister)   Fatty liver    Glaucoma    History of bronchitis    History of kidney stones    History of pneumonia    Hypertension    Hypothyroidism    Migraines    Nose colonized with MRSA 02/14/2021   a.) preoperative PCR (+) on 02/14/2021 prior to LUMBAR MICRODISCECTOMY; b.) preoperative PCR (+) 06/24/2022 prior to REVERSE SHOULDER ARTHROPLASTY W/ BICEPS TENODESIS   Obesity    OSA on CPAP    Plantar fasciitis    right   Pneumonia    Renal calculi    Rheumatoid arthritis (HCC)    RLS (restless legs syndrome) 08/23/2014   T2DM (type 2 diabetes mellitus) (HCC)    Past Surgical History:  Procedure Laterality Date   ABDOMINAL HYSTERECTOMY     partial   APPENDECTOMY     Arthroscopic surgery, knee Left    BREAST BIOPSY Right    several   BREAST BIOPSY Right 03/11/2017   u/s bx neg   BUNIONECTOMY     LEFT    COLONOSCOPY W/ POLYPECTOMY     COLONOSCOPY WITH PROPOFOL N/A 08/14/2017   Procedure: COLONOSCOPY WITH PROPOFOL;  Surgeon: Christena Deem, MD;  Location: [facility] ENDOSCOPY;  Service:  Endoscopy;  Laterality: N/A;   EXTRACORPOREAL SHOCK WAVE LITHOTRIPSY Left 05/14/2017   Procedure: EXTRACORPOREAL SHOCK WAVE LITHOTRIPSY (ESWL);  Surgeon: Riki Altes, MD;  Location: [facility] ORS;  Service: Urology;  Laterality: Left;   EXTRACORPOREAL SHOCK WAVE LITHOTRIPSY Left 06/14/2020   Procedure: EXTRACORPOREAL SHOCK WAVE LITHOTRIPSY (ESWL);  Surgeon: Sondra Come, MD;  Location: [facility] ORS;  Service: Urology;  Laterality: Left;   FOOT SURGERY     RIGHT     KNEE ARTHROSCOPY W/ MENISCAL REPAIR Left    LASIK     LUMBAR LAMINECTOMY/DECOMPRESSION MICRODISCECTOMY Right 03/01/2021   Procedure: Microdiscectomy - Lumbar five-Sacral one - right;  Surgeon: Tia Alert, MD;  Location: [facility] OR;  Service: Neurosurgery;  Laterality: Right;   MAXIMUM ACCESS (MAS)POSTERIOR LUMBAR INTERBODY FUSION (PLIF) 1 LEVEL N/A 04/25/2016   Procedure: LUMBAR FOUR-FIVE  MAXIMUM ACCESS (MAS) POSTERIOR LUMBAR INTERBODY FUSION (PLIF) with extension of instrumentation LUMBAR TWO-FIVE;  Surgeon: Tia Alert, MD;  Location: [facility] OR;  Service: Neurosurgery;  Laterality: N/A;   MAXIMUM ACCESS (MAS)POSTERIOR LUMBAR INTERBODY FUSION (  PLIF) 2 LEVEL N/A 12/13/2015   Procedure: Lumbar two-three - Lumbar three-four MAXIMUM ACCESS (MAS) POSTERIOR LUMBAR INTERBODY FUSION (PLIF)  ;  Surgeon: Tia Alert, MD;  Location: [facility] NEURO ORS;  Service: Neurosurgery;  Laterality: N/A;   REVERSE SHOULDER ARTHROPLASTY Left 03/01/2020   Procedure: REVERSE SHOULDER ARTHROPLASTY;  Surgeon: Christena Flake, MD;  Location: [facility] ORS;  Service: Orthopedics;  Laterality: Left;   REVERSE SHOULDER ARTHROPLASTY Right 07/03/2022   Procedure: REVERSE SHOULDER ARTHROPLASTY W/ BICEPS TENODESIS.;  Surgeon: Christena Flake, MD;  Location: [facility] ORS;  Service: Orthopedics;  Laterality: Right;   SHOULDER SURGERY     RIGHT    TONSILLECTOMY     Patient Active Problem List   Diagnosis Date Noted   Hypotension 09/15/2022   Lactic acidosis 09/15/2022   Syncope  08/19/2022   S/P lumbar laminectomy 03/01/2021   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) 01/03/2019   Chronic cough 11/19/2016   Morbid obesity (HCC) 09/26/2016   Left ovarian cyst 05/30/2016   S/P lumbar spinal fusion 12/13/2015   Aortic calcification (HCC) 07/18/2015   Controlled type 2 diabetes mellitus without complication (HCC) 05/16/2015   Thrombocytopenia (HCC) 05/16/2015   Recurrent major depressive disorder, in full remission (HCC) 05/16/2015   Essential (primary) hypertension 05/16/2015   Fatty infiltration of liver 03/15/2015   Aortic valve stenosis, nonrheumatic 01/18/2015   Sciatica of right side 01/09/2015   Type 2 diabetes mellitus (HCC) 11/10/2014   RLS (restless legs syndrome) 08/23/2014   Neuritis or radiculitis due to rupture of lumbar intervertebral disc 06/13/2014   Degeneration of intervertebral disc of lumbar region 06/13/2014   Lumbar radiculitis 06/13/2014   Arthritis 01/23/2014   Arthritis of knee, degenerative 11/15/2013   Anxiety and depression 09/29/2013   Insomnia 09/29/2013   OSA on CPAP 08/29/2013   History of nephrolithiasis 08/29/2013   Abnormal presence of protein in urine 08/29/2013   Headache, migraine 08/29/2013   BP (high blood pressure) 08/29/2013   HLD (hyperlipidemia) 08/29/2013   Allergic rhinitis 08/29/2013   Intractable migraine without aura 02/18/2013    REFERRING DIAG: Reverse right total shoulder arthroplasty with biceps tenodesis.    THERAPY DIAG:  Right shoulder pain, unspecified chronicity  Stiffness of right shoulder, not elsewhere classified  Rationale for Evaluation and Treatment Rehabilitation  PERTINENT HISTORY: S/P R reverse total shoulder replacement on 07/03/2022. Feels a little bit aggravated currently after taking a shower but usually goes away. Has had a couple of PT visits for home health. Exercises included pendulums and PROM.    PRECAUTIONS: PROM during initial weeks, no reaching behind back  Per BERT Daisy Blossom III, MD:  It should be safe for her to resume physical therapy on her shoulder, however.  Per Dr. Daniel Nones III, MD note on 09/17/2022: "She is medically stable to resume physical therapy immediately. "   No latex allergies   SUBJECTIVE:   SUBJECTIVE:    SUBJECTIVE STATEMENT: R shoulder is not too bad, no pain.         PAIN:  Are you having pain? See subjective         TODAY'S TREATMENT:  DATE: 10/09/2022   Therapeutic exercises  Currently following the [facility] Reverse Total Shoulder Replacement Protocol   Blood pressure, L arm sitting, mechanically taken, normal cuff: 130/69, HR 79     Seated R shoulder AAROM with PT                Flexion 10x3 (AAROM last set to help improve ROM)                Scaption 10x3                Abduction 10x3   Seated R shoulder ER with yellow band 10x2   Seated R shoulder IR with yellow band 5x4   Seated bicep curls yellow 10x     Reviewed POC: possible graduation from PT for R TSA rehab next week.     Seated B scapular retraction yellow band 10x2 with 5 second holds   Seated R triceps extension yellow band 10x, then 10x5 seconds                 Then red band 10x5 seconds    Seated R shoulder extension with scapular retraction yellow band 10x                Then red band 10x, then 10x5 seconds for 2 sets   Seated bicep curls yellow 10x2   Seated manually resisted scapular retraction targeting the lower trap                 R 10x5 seconds for 3 sets               Improved exercise technique, movement at target joints, use of target muscles after mod verbal, visual, tactile cues.        Response to treatment Pt tolerated session well without aggravation of symptoms.         Clinical impression Pt currently 14 weeks post op. Continued working on ROM, as well as rotator cuff, scapular, triceps, and biceps strength to promote ability to raise her R arm up more comfortably. Pt tolerated session well without aggravation of shoulder symptoms. Pt will benefit from continued skilled physical therapy services to improve ROM, strength, and function.       PATIENT EDUCATION: Education details: there-ex Person educated: Patient Education method: Explanation and Verbal cues Education comprehension: verbalized understanding and returned demonstration  HOME EXERCISE PROGRAM: Access Code: [email or web address] URL: [email or web address] Date: 08/04/2022 Prepared by: Loralyn Freshwater  Exercises - Supine Shoulder Flexion AAROM with Hands Clasped  - 1 x daily - 7 x weekly - 3 sets - 10 reps  - Seated Shoulder Flexion AAROM with Pulley Behind  - 3 x daily - 7 x weekly - 3 sets - 10 reps - Seated Shoulder Abduction AAROM with Pulley Behind  - 3 x daily - 7 x weekly - 3 sets - 10 reps - Scapular Retraction with Resistance  - 1 x daily - 7 x weekly - 3 sets - 10 reps  Yellow band   PT Short Term Goals - 09/18/22 1048       PT SHORT TERM GOAL #1   Title Patient will be independent with her initial HEP to improve ROM and R UE function.    Baseline No questions wiht her HEP (09/18/2022)    Time 3    Period Weeks    Status Achieved    Target Date 08/14/22  PT Long Term Goals - 09/18/22 1104       PT LONG TERM GOAL #1   Title Pt will have a leat 120 degrees R shoulder flexion and abduction A/AROM to promote ability to reach and use her R UE for functional tasks.    Baseline R shoulder PROM 95 degrees flexion, 72 degrees abduction (07/22/2022); R shoulder AROM 123 degrees flexion, 107 degrees abduction (09/18/2022)    Time 12    Period Weeks    Status Partially Met    Target Date 10/16/22      PT LONG TERM GOAL #2   Title Pt will improve R shoulder ER  A/AROM to at least 60 degrees to promote ability to reach behind her head.    Baseline R shoulder ER PROM in scapular plane: 33 degrees (07/22/2022); R shoulder in scapular plane: ER AAROM 41 degrees (09/18/2022)    Time 12    Period Weeks    Status Partially Met    Target Date 10/16/22      PT LONG TERM GOAL #3   Title Pt will have at least 4/5 R shoulder strength at all planes to promote ability to use her R UE for functional tasks.    Baseline R shoulder strength not tested yet to allow for more healing (07/22/2022); R shoulder: 4+/5 flexion, 4+/5 abduction, 4/5 ER, 4/5 IR (09/18/2022)    Time 12    Period Weeks    Status Achieved    Target Date 10/16/22      PT LONG TERM GOAL #4   Title Pt will improve R shoulder FOTO score to at least 50 as a demonstration of improved function.    Baseline R shoulder FOTO score 34 (07/22/2022)    Time 12    Period Weeks    Status New    Target Date 10/16/22              Plan - 10/09/22 1442     Clinical Impression Statement Pt currently 14 weeks post op. Continued working on ROM, as well as rotator cuff, scapular, triceps, and biceps strength to promote ability to raise her R arm up more comfortably. Pt tolerated session well without aggravation of shoulder symptoms. Pt will benefit from continued skilled physical therapy services to improve ROM, strength, and function.    Personal Factors and Comorbidities Comorbidity 3+;Age;Past/Current Experience;Time since onset of injury/illness/exacerbation;Fitness    Comorbidities Anxiety, depression, HTN, DM, arthritis    Examination-Activity Limitations Bathing;Carry;Lift;Bed Mobility;Toileting;Dressing;Reach Overhead;Caring for Others;Self Feeding;Hygiene/Grooming    Stability/Clinical Decision Making Stable/Uncomplicated    Rehab Potential Fair    PT Frequency 2x / week    PT Duration 12 weeks    PT Treatment/Interventions Therapeutic activities;Therapeutic exercise;Neuromuscular  re-education;Patient/family education;Manual techniques;Aquatic Therapy;Electrical Stimulation    PT Next Visit Plan PROM, manual techniques, modalities PRN    PT Home Exercise Plan Medbridge Access Code: [email or web address]    Consulted and Agree with Plan of Care Patient                Loralyn Freshwater PT, DPT  10/09/2022, 4:32 PM

## 2022-10-09 NOTE — Therapy (Deleted)
OUTPATIENT PHYSICAL THERAPY TREATMENT NOTE    Patient Name: Jessica Cole MRN: [id number] DOB:11-11-1950, 72 y.o., female Today's Date: 10/09/2022  PCP: Lynnea Ferrier, MD  REFERRING PROVIDER: Anson [place], PA-C   END OF SESSION:  PT End of Session - 10/09/22 1443     Visit Number 16    Number of Visits 25    Date for PT Re-Evaluation 10/16/22    Progress Note Due on Visit 20    PT Start Time 1445    PT Stop Time 1527    PT Time Calculation (min) 42 min    Activity Tolerance Patient tolerated treatment well    Behavior During Therapy WFL for tasks assessed/performed                           Past Medical History:  Diagnosis Date   Anxiety    Asthma    seasonal with allergies   Colon polyps    Degenerative arthritis    Depression    Environmental allergies    Family history of adverse reaction to anesthesia    a.) PONV in 1st degree relative (sister)   Fatty liver    Glaucoma    History of bronchitis    History of kidney stones    History of pneumonia    Hypertension    Hypothyroidism    Migraines    Nose colonized with MRSA 02/14/2021   a.) preoperative PCR (+) on 02/14/2021 prior to LUMBAR MICRODISCECTOMY; b.) preoperative PCR (+) 06/24/2022 prior to REVERSE SHOULDER ARTHROPLASTY W/ BICEPS TENODESIS   Obesity    OSA on CPAP    Plantar fasciitis    right   Pneumonia    Renal calculi    Rheumatoid arthritis (HCC)    RLS (restless legs syndrome) 08/23/2014   T2DM (type 2 diabetes mellitus) (HCC)    Past Surgical History:  Procedure Laterality Date   ABDOMINAL HYSTERECTOMY     partial   APPENDECTOMY     Arthroscopic surgery, knee Left    BREAST BIOPSY Right    several   BREAST BIOPSY Right 03/11/2017   u/s bx neg   BUNIONECTOMY     LEFT    COLONOSCOPY W/ POLYPECTOMY     COLONOSCOPY WITH PROPOFOL N/A 08/14/2017   Procedure: COLONOSCOPY WITH PROPOFOL;  Surgeon: Christena Deem, MD;  Location: [facility] ENDOSCOPY;  Service:  Endoscopy;  Laterality: N/A;   EXTRACORPOREAL SHOCK WAVE LITHOTRIPSY Left 05/14/2017   Procedure: EXTRACORPOREAL SHOCK WAVE LITHOTRIPSY (ESWL);  Surgeon: Riki Altes, MD;  Location: [facility] ORS;  Service: Urology;  Laterality: Left;   EXTRACORPOREAL SHOCK WAVE LITHOTRIPSY Left 06/14/2020   Procedure: EXTRACORPOREAL SHOCK WAVE LITHOTRIPSY (ESWL);  Surgeon: Sondra Come, MD;  Location: [facility] ORS;  Service: Urology;  Laterality: Left;   FOOT SURGERY     RIGHT     KNEE ARTHROSCOPY W/ MENISCAL REPAIR Left    LASIK     LUMBAR LAMINECTOMY/DECOMPRESSION MICRODISCECTOMY Right 03/01/2021   Procedure: Microdiscectomy - Lumbar five-Sacral one - right;  Surgeon: Tia Alert, MD;  Location: [facility] OR;  Service: Neurosurgery;  Laterality: Right;   MAXIMUM ACCESS (MAS)POSTERIOR LUMBAR INTERBODY FUSION (PLIF) 1 LEVEL N/A 04/25/2016   Procedure: LUMBAR FOUR-FIVE  MAXIMUM ACCESS (MAS) POSTERIOR LUMBAR INTERBODY FUSION (PLIF) with extension of instrumentation LUMBAR TWO-FIVE;  Surgeon: Tia Alert, MD;  Location: [facility] OR;  Service: Neurosurgery;  Laterality: N/A;   MAXIMUM ACCESS (MAS)POSTERIOR LUMBAR INTERBODY  FUSION (PLIF) 2 LEVEL N/A 12/13/2015   Procedure: Lumbar two-three - Lumbar three-four MAXIMUM ACCESS (MAS) POSTERIOR LUMBAR INTERBODY FUSION (PLIF)  ;  Surgeon: Tia Alert, MD;  Location: [facility] NEURO ORS;  Service: Neurosurgery;  Laterality: N/A;   REVERSE SHOULDER ARTHROPLASTY Left 03/01/2020   Procedure: REVERSE SHOULDER ARTHROPLASTY;  Surgeon: Christena Flake, MD;  Location: [facility] ORS;  Service: Orthopedics;  Laterality: Left;   REVERSE SHOULDER ARTHROPLASTY Right 07/03/2022   Procedure: REVERSE SHOULDER ARTHROPLASTY W/ BICEPS TENODESIS.;  Surgeon: Christena Flake, MD;  Location: [facility] ORS;  Service: Orthopedics;  Laterality: Right;   SHOULDER SURGERY     RIGHT    TONSILLECTOMY     Patient Active Problem List   Diagnosis Date Noted   Hypotension 09/15/2022   Lactic acidosis 09/15/2022   Syncope  08/19/2022   S/P lumbar laminectomy 03/01/2021   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) 01/03/2019   Chronic cough 11/19/2016   Morbid obesity (HCC) 09/26/2016   Left ovarian cyst 05/30/2016   S/P lumbar spinal fusion 12/13/2015   Aortic calcification (HCC) 07/18/2015   Controlled type 2 diabetes mellitus without complication (HCC) 05/16/2015   Thrombocytopenia (HCC) 05/16/2015   Recurrent major depressive disorder, in full remission (HCC) 05/16/2015   Essential (primary) hypertension 05/16/2015   Fatty infiltration of liver 03/15/2015   Aortic valve stenosis, nonrheumatic 01/18/2015   Sciatica of right side 01/09/2015   Type 2 diabetes mellitus (HCC) 11/10/2014   RLS (restless legs syndrome) 08/23/2014   Neuritis or radiculitis due to rupture of lumbar intervertebral disc 06/13/2014   Degeneration of intervertebral disc of lumbar region 06/13/2014   Lumbar radiculitis 06/13/2014   Arthritis 01/23/2014   Arthritis of knee, degenerative 11/15/2013   Anxiety and depression 09/29/2013   Insomnia 09/29/2013   OSA on CPAP 08/29/2013   History of nephrolithiasis 08/29/2013   Abnormal presence of protein in urine 08/29/2013   Headache, migraine 08/29/2013   BP (high blood pressure) 08/29/2013   HLD (hyperlipidemia) 08/29/2013   Allergic rhinitis 08/29/2013   Intractable migraine without aura 02/18/2013    REFERRING DIAG: Reverse right total shoulder arthroplasty with biceps tenodesis.    THERAPY DIAG:  Right shoulder pain, unspecified chronicity  Stiffness of right shoulder, not elsewhere classified  Rationale for Evaluation and Treatment Rehabilitation  PERTINENT HISTORY: S/P R reverse total shoulder replacement on 07/03/2022. Feels a little bit aggravated currently after taking a shower but usually goes away. Has had a couple of PT visits for home health. Exercises included pendulums and PROM.    PRECAUTIONS: PROM during initial weeks, no reaching behind back  Per BERT Daisy Blossom III, MD:  It should be safe for her to resume physical therapy on her shoulder, however.  Per Dr. Daniel Nones III, MD note on 09/17/2022: "She is medically stable to resume physical therapy immediately. "   No latex allergies   SUBJECTIVE:   SUBJECTIVE STATEMENT: R shoulder is not too bad, no pain.     PAIN:  Are you having pain? See subjective     TODAY'S TREATMENT:  DATE: 10/09/2022  Therapeutic exercises  Currently following the [facility] Reverse Total Shoulder Replacement Protocol  Blood pressure, L arm sitting, mechanically taken, normal cuff: 130/69, HR 79   Seated R shoulder AAROM with PT  Flexion 10x3 (AAROM last set to help improve ROM)  Scaption 10x3  Abduction 10x3  Seated R shoulder ER with yellow band 10x2  Seated R shoulder IR with yellow band 5x4  Seated bicep curls yellow 10x   Reviewed POC: possible graduation from PT for R TSA rehab next week.    Seated B scapular retraction yellow band 10x2 with 5 second holds  Seated R triceps extension yellow band 10x, then 10x5 seconds   Then red band 10x5 seconds   Seated R shoulder extension with scapular retraction yellow band 10x  Then red band 10x, then 10x5 seconds for 2 sets  Seated bicep curls yellow 10x2  Seated manually resisted scapular retraction targeting the lower trap   R 10x5 seconds for 3 sets      Improved exercise technique, movement at target joints, use of target muscles after mod verbal, visual, tactile cues.      Response to treatment Pt tolerated session well without aggravation of symptoms.     Clinical impression Pt currently 14 weeks post op. Continued working on ROM, as well as rotator cuff, scapular, triceps, and biceps strength to  promote ability to use her R UE for functional tasks. Performed exercises in sitting to decrease extension stress to her low back. Pt tolerated session well without aggravation of shoulder symptoms. Pt will benefit from continued skilled physical therapy services to improve ROM, strength, and function.        PATIENT EDUCATION: Education details: there-ex Person educated: Patient Education method: Explanation and Verbal cues Education comprehension: verbalized understanding and returned demonstration  HOME EXERCISE PROGRAM: Access Code: [email or web address] URL: [email or web address] Date: 08/04/2022 Prepared by: Loralyn Freshwater  Exercises - Supine Shoulder Flexion AAROM with Hands Clasped  - 1 x daily - 7 x weekly - 3 sets - 10 reps  - Seated Shoulder Flexion AAROM with Pulley Behind  - 3 x daily - 7 x weekly - 3 sets - 10 reps - Seated Shoulder Abduction AAROM with Pulley Behind  - 3 x daily - 7 x weekly - 3 sets - 10 reps - Scapular Retraction with Resistance  - 1 x daily - 7 x weekly - 3 sets - 10 reps  Yellow band   PT Short Term Goals - 09/18/22 1048       PT SHORT TERM GOAL #1   Title Patient will be independent with her initial HEP to improve ROM and R UE function.    Baseline No questions wiht her HEP (09/18/2022)    Time 3    Period Weeks    Status Achieved    Target Date 08/14/22              PT Long Term Goals - 09/18/22 1104       PT LONG TERM GOAL #1   Title Pt will have a leat 120 degrees R shoulder flexion and abduction A/AROM to promote ability to reach and use her R UE for functional tasks.    Baseline R shoulder PROM 95 degrees flexion, 72 degrees abduction (07/22/2022); R shoulder AROM 123 degrees flexion, 107 degrees abduction (09/18/2022)    Time 12    Period Weeks    Status Partially Met    Target Date 10/16/22  PT LONG TERM GOAL #2   Title Pt will improve R shoulder ER A/AROM to at least 60 degrees to promote ability to reach behind her  head.    Baseline R shoulder ER PROM in scapular plane: 33 degrees (07/22/2022); R shoulder in scapular plane: ER AAROM 41 degrees (09/18/2022)    Time 12    Period Weeks    Status Partially Met    Target Date 10/16/22      PT LONG TERM GOAL #3   Title Pt will have at least 4/5 R shoulder strength at all planes to promote ability to use her R UE for functional tasks.    Baseline R shoulder strength not tested yet to allow for more healing (07/22/2022); R shoulder: 4+/5 flexion, 4+/5 abduction, 4/5 ER, 4/5 IR (09/18/2022)    Time 12    Period Weeks    Status Achieved    Target Date 10/16/22      PT LONG TERM GOAL #4   Title Pt will improve R shoulder FOTO score to at least 50 as a demonstration of improved function.    Baseline R shoulder FOTO score 34 (07/22/2022)    Time 12    Period Weeks    Status New    Target Date 10/16/22              Plan - 10/09/22 1442     Clinical Impression Statement Pt currently 14 weeks post op. Continued working on ROM, as well as rotator cuff, scapular, triceps, and biceps strength to promote ability to raise her R arm up more comfortably. Pt tolerated session well without aggravation of shoulder symptoms. Pt will benefit from continued skilled physical therapy services to improve ROM, strength, and function.    Personal Factors and Comorbidities Comorbidity 3+;Age;Past/Current Experience;Time since onset of injury/illness/exacerbation;Fitness    Comorbidities Anxiety, depression, HTN, DM, arthritis    Examination-Activity Limitations Bathing;Carry;Lift;Bed Mobility;Toileting;Dressing;Reach Overhead;Caring for Others;Self Feeding;Hygiene/Grooming    Stability/Clinical Decision Making Stable/Uncomplicated    Rehab Potential Fair    PT Frequency 2x / week    PT Duration 12 weeks    PT Treatment/Interventions Therapeutic activities;Therapeutic exercise;Neuromuscular re-education;Patient/family education;Manual techniques;Aquatic Therapy;Electrical  Stimulation    PT Next Visit Plan PROM, manual techniques, modalities PRN    PT Home Exercise Plan Medbridge Access Code: [email or web address]    Consulted and Agree with Plan of Care Patient                Loralyn Freshwater PT, DPT  10/09/2022, 3:42 PM

## 2022-10-14 ENCOUNTER — Ambulatory Visit: Payer: Medicare HMO

## 2022-10-14 DIAGNOSIS — M25611 Stiffness of right shoulder, not elsewhere classified: Secondary | ICD-10-CM

## 2022-10-14 DIAGNOSIS — M6281 Muscle weakness (generalized): Secondary | ICD-10-CM

## 2022-10-14 DIAGNOSIS — M25511 Pain in right shoulder: Secondary | ICD-10-CM

## 2022-10-14 NOTE — Therapy (Signed)
OUTPATIENT PHYSICAL THERAPY TREATMENT NOTE And Discharge Summary    Patient Name: Jessica Cole MRN: [id number] DOB:05/05/1951, 72 y.o., female Today's Date: 10/14/2022  PCP: Lynnea Ferrier, MD  REFERRING PROVIDER: Anson [place], PA-C   END OF SESSION:  PT End of Session - 10/14/22 0733     Visit Number 17    Number of Visits 25    Date for PT Re-Evaluation 10/16/22    Progress Note Due on Visit 20    PT Start Time 0734    PT Stop Time 0822    PT Time Calculation (min) 48 min    Activity Tolerance Patient tolerated treatment well    Behavior During Therapy [facility] for tasks assessed/performed                           Past Medical History:  Diagnosis Date   Anxiety    Asthma    seasonal with allergies   Colon polyps    Degenerative arthritis    Depression    Environmental allergies    Family history of adverse reaction to anesthesia    a.) PONV in 1st degree relative (sister)   Fatty liver    Glaucoma    History of bronchitis    History of kidney stones    History of pneumonia    Hypertension    Hypothyroidism    Migraines    Nose colonized with MRSA 02/14/2021   a.) preoperative PCR (+) on 02/14/2021 prior to LUMBAR MICRODISCECTOMY; b.) preoperative PCR (+) 06/24/2022 prior to REVERSE SHOULDER ARTHROPLASTY W/ BICEPS TENODESIS   Obesity    OSA on CPAP    Plantar fasciitis    right   Pneumonia    Renal calculi    Rheumatoid arthritis (HCC)    RLS (restless legs syndrome) 08/23/2014   T2DM (type 2 diabetes mellitus) (HCC)    Past Surgical History:  Procedure Laterality Date   ABDOMINAL HYSTERECTOMY     partial   APPENDECTOMY     Arthroscopic surgery, knee Left    BREAST BIOPSY Right    several   BREAST BIOPSY Right 03/11/2017   u/s bx neg   BUNIONECTOMY     LEFT    COLONOSCOPY W/ POLYPECTOMY     COLONOSCOPY WITH PROPOFOL N/A 08/14/2017   Procedure: COLONOSCOPY WITH PROPOFOL;  Surgeon: Christena Deem, MD;  Location:  [facility] ENDOSCOPY;  Service: Endoscopy;  Laterality: N/A;   EXTRACORPOREAL SHOCK WAVE LITHOTRIPSY Left 05/14/2017   Procedure: EXTRACORPOREAL SHOCK WAVE LITHOTRIPSY (ESWL);  Surgeon: Riki Altes, MD;  Location: [facility] ORS;  Service: Urology;  Laterality: Left;   EXTRACORPOREAL SHOCK WAVE LITHOTRIPSY Left 06/14/2020   Procedure: EXTRACORPOREAL SHOCK WAVE LITHOTRIPSY (ESWL);  Surgeon: Sondra Come, MD;  Location: [facility] ORS;  Service: Urology;  Laterality: Left;   FOOT SURGERY     RIGHT     KNEE ARTHROSCOPY W/ MENISCAL REPAIR Left    LASIK     LUMBAR LAMINECTOMY/DECOMPRESSION MICRODISCECTOMY Right 03/01/2021   Procedure: Microdiscectomy - Lumbar five-Sacral one - right;  Surgeon: Tia Alert, MD;  Location: [facility] OR;  Service: Neurosurgery;  Laterality: Right;   MAXIMUM ACCESS (MAS)POSTERIOR LUMBAR INTERBODY FUSION (PLIF) 1 LEVEL N/A 04/25/2016   Procedure: LUMBAR FOUR-FIVE  MAXIMUM ACCESS (MAS) POSTERIOR LUMBAR INTERBODY FUSION (PLIF) with extension of instrumentation LUMBAR TWO-FIVE;  Surgeon: Tia Alert, MD;  Location: [facility] OR;  Service: Neurosurgery;  Laterality: N/A;   MAXIMUM ACCESS (  MAS)POSTERIOR LUMBAR INTERBODY FUSION (PLIF) 2 LEVEL N/A 12/13/2015   Procedure: Lumbar two-three - Lumbar three-four MAXIMUM ACCESS (MAS) POSTERIOR LUMBAR INTERBODY FUSION (PLIF)  ;  Surgeon: Tia Alert, MD;  Location: [facility] NEURO ORS;  Service: Neurosurgery;  Laterality: N/A;   REVERSE SHOULDER ARTHROPLASTY Left 03/01/2020   Procedure: REVERSE SHOULDER ARTHROPLASTY;  Surgeon: Christena Flake, MD;  Location: [facility] ORS;  Service: Orthopedics;  Laterality: Left;   REVERSE SHOULDER ARTHROPLASTY Right 07/03/2022   Procedure: REVERSE SHOULDER ARTHROPLASTY W/ BICEPS TENODESIS.;  Surgeon: Christena Flake, MD;  Location: [facility] ORS;  Service: Orthopedics;  Laterality: Right;   SHOULDER SURGERY     RIGHT    TONSILLECTOMY     Patient Active Problem List   Diagnosis Date Noted   Hypotension 09/15/2022   Lactic acidosis  09/15/2022   Syncope 08/19/2022   S/P lumbar laminectomy 03/01/2021   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) 01/03/2019   Chronic cough 11/19/2016   Morbid obesity (HCC) 09/26/2016   Left ovarian cyst 05/30/2016   S/P lumbar spinal fusion 12/13/2015   Aortic calcification (HCC) 07/18/2015   Controlled type 2 diabetes mellitus without complication (HCC) 05/16/2015   Thrombocytopenia (HCC) 05/16/2015   Recurrent major depressive disorder, in full remission (HCC) 05/16/2015   Essential (primary) hypertension 05/16/2015   Fatty infiltration of liver 03/15/2015   Aortic valve stenosis, nonrheumatic 01/18/2015   Sciatica of right side 01/09/2015   Type 2 diabetes mellitus (HCC) 11/10/2014   RLS (restless legs syndrome) 08/23/2014   Neuritis or radiculitis due to rupture of lumbar intervertebral disc 06/13/2014   Degeneration of intervertebral disc of lumbar region 06/13/2014   Lumbar radiculitis 06/13/2014   Arthritis 01/23/2014   Arthritis of knee, degenerative 11/15/2013   Anxiety and depression 09/29/2013   Insomnia 09/29/2013   OSA on CPAP 08/29/2013   History of nephrolithiasis 08/29/2013   Abnormal presence of protein in urine 08/29/2013   Headache, migraine 08/29/2013   BP (high blood pressure) 08/29/2013   HLD (hyperlipidemia) 08/29/2013   Allergic rhinitis 08/29/2013   Intractable migraine without aura 02/18/2013    REFERRING DIAG: Reverse right total shoulder arthroplasty with biceps tenodesis.    THERAPY DIAG:  Right shoulder pain, unspecified chronicity  Stiffness of right shoulder, not elsewhere classified  Muscle weakness (generalized)  Rationale for Evaluation and Treatment Rehabilitation  PERTINENT HISTORY: S/P R reverse total shoulder replacement on 07/03/2022. Feels a little bit aggravated currently after taking a shower but usually goes away. Has had a couple of PT visits  for home health. Exercises included pendulums and PROM.   PRECAUTIONS: PROM during initial weeks, no reaching behind back  Per BERT Daisy Blossom III, MD:  It should be safe for her to resume physical therapy on her shoulder, however.  Per Dr. Daniel Nones III, MD note on 09/17/2022: "She is medically stable to resume physical therapy immediately. "   No latex allergies   SUBJECTIVE:   SUBJECTIVE:    SUBJECTIVE STATEMENT: R shoulder is a little sore when she moves it a certain way, not a bad sore. No shoulder pain currently.          PAIN:  Are you having pain? See subjective         TODAY'S TREATMENT:  DATE: 10/14/2022   Therapeutic exercises  Currently following the [facility] Reverse Total Shoulder Replacement Protocol   Blood pressure, L arm sitting, mechanically taken, normal cuff: 138/66, HR 70   Standing R shoulder AROM flexion and abduction 1x each way  Standing manually resisted R shoulder flexion, abduction, ER, IR 1x each way  Supine R shoulder ER AAROM with PVC rod, in scapular plane 10x,  Then with PT 10x5 seconds x3   Improved R shoulder ER AAROM in scapular plane to 61 degrees   reviewed progress/current status with PT towards goals  Seated R shoulder ER AAROM 10x5 seconds for 2 sets with PVC rod  Seated R shoulder ER with yellow band 10x2   Seated R shoulder IR with yellow band 10x2        Improved exercise technique, movement at target joints, use of target muscles after mod verbal, visual, tactile cues.        Response to treatment Pt tolerated session well without aggravation of symptoms.        Clinical impression Pt currently 14-15 weeks post op. Pt demonstrates improved R shoulder AROM, strength, and function since initial evaluation and has achieved all goals. Skilled physical therapy services  discharged with pt continuing her progress with her exercises at home.        PATIENT EDUCATION: Education details: there-ex Person educated: Patient Education method: Explanation and Verbal cues Education comprehension: verbalized understanding and returned demonstration  HOME EXERCISE PROGRAM: Access Code: [email or web address] URL: [email or web address] Date: 08/04/2022 Prepared by: Loralyn Freshwater  Exercises - Supine Shoulder Flexion AAROM with Hands Clasped  - 1 x daily - 7 x weekly - 3 sets - 10 reps  - Seated Shoulder Flexion AAROM with Pulley Behind  - 3 x daily - 7 x weekly - 3 sets - 10 reps - Seated Shoulder Abduction AAROM with Pulley Behind  - 3 x daily - 7 x weekly - 3 sets - 10 reps - Scapular Retraction with Resistance  - 1 x daily - 7 x weekly - 3 sets - 10 reps  Yellow band  - Seated Shoulder External Rotation AAROM with Cane and Hand in Neutral  - 1 x daily - 7 x weekly - 3 sets - 10 reps - 5 seconds hold - Standing Shoulder External Rotation with Resistance  - 1 x daily - 7 x weekly - 2 sets - 10 reps - Shoulder Internal Rotation with Resistance  - 1 x daily - 7 x weekly - 2 sets - 10 reps    PT Short Term Goals - 09/18/22 1048       PT SHORT TERM GOAL #1   Title Patient will be independent with her initial HEP to improve ROM and R UE function.    Baseline No questions wiht her HEP (09/18/2022)    Time 3    Period Weeks    Status Achieved    Target Date 08/14/22              PT Long Term Goals - 10/14/22 0738       PT LONG TERM GOAL #1   Title Pt will have a leat 120 degrees R shoulder flexion and abduction A/AROM to promote ability to reach and use her R UE for functional tasks.    Baseline R shoulder PROM 95 degrees flexion, 72 degrees abduction (07/22/2022); R shoulder AROM 123 degrees flexion, 107 degrees abduction (09/18/2022); R shoulder AROM 125 degrees flexion, 123 degrees abduction (10/14/2022)  Time 12    Period Weeks    Status  Achieved    Target Date 10/16/22      PT LONG TERM GOAL #2   Title Pt will improve R shoulder ER A/AROM to at least 60 degrees to promote ability to reach behind her head.    Baseline R shoulder ER PROM in scapular plane: 33 degrees (07/22/2022); R shoulder in scapular plane: ER AAROM 41 degrees (09/18/2022); A/AROM in scapular plane: 38 degrees which improved to 61 degrees after ROM exercise (10/14/2022)    Time 12    Period Weeks    Status Achieved    Target Date 10/16/22      PT LONG TERM GOAL #3   Title Pt will have at least 4/5 R shoulder strength at all planes to promote ability to use her R UE for functional tasks.    Baseline R shoulder strength not tested yet to allow for more healing (07/22/2022); R shoulder: 4+/5 flexion, 4+/5 abduction, 4/5 ER, 4/5 IR (09/18/2022); R shoulder: 5/5 flexion, 5/5 abduction, 4+/5 ER, 4+/5 IR (10/14/2022)    Time 12    Period Weeks    Status Achieved    Target Date 10/16/22      PT LONG TERM GOAL #4   Title Pt will improve R shoulder FOTO score to at least 50 as a demonstration of improved function.    Baseline R shoulder FOTO score 34 (07/22/2022); 70 (10/14/2022)    Time 12    Period Weeks    Status Achieved    Target Date 10/16/22              Plan - 10/14/22 0729     Clinical Impression Statement Pt currently 14-15 weeks post op. Pt demonstrates improved R shoulder AROM, strength, and function since initial evaluation and has achieved all goals. Skilled physical therapy services discharged with pt continuing her progress with her exercises at home.    Personal Factors and Comorbidities Comorbidity 3+;Age;Past/Current Experience;Time since onset of injury/illness/exacerbation;Fitness    Comorbidities Anxiety, depression, HTN, DM, arthritis    Examination-Activity Limitations Bathing;Carry;Lift;Bed Mobility;Toileting;Dressing;Reach Overhead;Caring for Others;Self Feeding;Hygiene/Grooming    Stability/Clinical Decision Making --    Rehab  Potential --    PT Frequency --    PT Duration --    PT Treatment/Interventions Therapeutic activities;Therapeutic exercise;Neuromuscular re-education;Patient/family education;Manual techniques    PT Next Visit Plan Continue progress with her exercises at home.    PT Home Exercise Plan Medbridge Access Code: [email or web address]    Consulted and Agree with Plan of Care Patient              Thank you for your referral.   Loralyn Freshwater PT, DPT  10/14/2022, 8:30 AM

## 2022-10-17 ENCOUNTER — Ambulatory Visit
Admission: RE | Admit: 2022-10-17 | Discharge: 2022-10-17 | Disposition: A | Discharge status: Home / Self Care | Payer: Medicare HMO | Source: Ambulatory Visit | Attending: Internal Medicine | Admitting: Internal Medicine

## 2022-10-17 DIAGNOSIS — M5417 Radiculopathy, lumbosacral region: Secondary | ICD-10-CM

## 2022-10-24 ENCOUNTER — Other Ambulatory Visit (HOSPITAL_COMMUNITY): Payer: Self-pay | Admitting: *Deleted

## 2022-10-24 ENCOUNTER — Telehealth (HOSPITAL_COMMUNITY): Payer: Self-pay | Admitting: *Deleted

## 2022-10-24 MED ORDER — METOPROLOL TARTRATE 100 MG PO TABS: ORAL_TABLET | ORAL | 0 refills | Status: AC

## 2022-10-24 NOTE — Telephone Encounter (Signed)
Reaching out to patient to offer assistance regarding upcoming cardiac imaging study; pt verbalizes understanding of appt date/time, parking situation and where to check in, pre-test NPO status and medications ordered, and verified current allergies; name and call back number provided for further questions should they arise ? ?Vernestine Brodhead RN Navigator Cardiac Imaging ?[facility] ?[phone] office ?[phone] cell ? ?Patient to take 100mg metoprolol tartrate two hours prior to her cardiac CT scan.  ?

## 2022-10-24 NOTE — Telephone Encounter (Signed)
Attempted to call patient regarding upcoming cardiac CT appointment. °Left message on voicemail with name and callback number ° °Maylynn Orzechowski RN Navigator Cardiac Imaging °[facility] °[phone] Office °[phone] Cell ° °

## 2022-10-27 ENCOUNTER — Other Ambulatory Visit: Payer: Self-pay | Admitting: Internal Medicine

## 2022-10-27 ENCOUNTER — Ambulatory Visit
Admission: RE | Admit: 2022-10-27 | Discharge: 2022-10-27 | Disposition: A | Discharge status: Home / Self Care | Payer: Medicare HMO | Source: Ambulatory Visit | Attending: Internal Medicine | Admitting: Internal Medicine

## 2022-10-27 DIAGNOSIS — R0789 Other chest pain: Secondary | ICD-10-CM | POA: Insufficient documentation

## 2022-10-27 DIAGNOSIS — I2510 Atherosclerotic heart disease of native coronary artery without angina pectoris: Secondary | ICD-10-CM | POA: Diagnosis not present

## 2022-10-27 DIAGNOSIS — R911 Solitary pulmonary nodule: Secondary | ICD-10-CM | POA: Insufficient documentation

## 2022-10-27 DIAGNOSIS — I700 Atherosclerosis of aorta: Secondary | ICD-10-CM | POA: Diagnosis not present

## 2022-10-27 DIAGNOSIS — R931 Abnormal findings on diagnostic imaging of heart and coronary circulation: Secondary | ICD-10-CM | POA: Insufficient documentation

## 2022-10-27 MED ORDER — IOHEXOL 350 MG/ML SOLN
80.0000 mL | Freq: Once | INTRAVENOUS | Status: AC | PRN
  Administered 2022-10-27: 80 mL via INTRAVENOUS

## 2022-10-27 MED ORDER — NITROGLYCERIN 0.4 MG SL SUBL
0.8000 mg | SUBLINGUAL_TABLET | Freq: Once | SUBLINGUAL | Status: AC
  Administered 2022-10-27: 0.8 mg via SUBLINGUAL
  Filled 2022-10-27: qty 25

## 2022-10-27 NOTE — Progress Notes (Signed)
Patient tolerated procedure well. Ambulate w/o difficulty. Denies any lightheadedness or being dizzy. Pt denies any pain at this time. Sitting in chair, pt is encouraged to drink additional water throughout the day and reason explained to patient. Patient verbalized understanding and all questions answered. ABC intact. No further needs at this time. Discharge from procedure area w/o issues.  

## 2022-12-16 ENCOUNTER — Other Ambulatory Visit: Payer: Self-pay | Admitting: Obstetrics and Gynecology

## 2022-12-16 DIAGNOSIS — Z1231 Encounter for screening mammogram for malignant neoplasm of breast: Secondary | ICD-10-CM

## 2022-12-19 ENCOUNTER — Other Ambulatory Visit: Payer: Self-pay | Admitting: Neurological Surgery

## 2023-01-06 NOTE — Progress Notes (Signed)
Surgical Instructions   Your procedure is scheduled on Monday January 19, 2023. Report to [facility] Main Entrance "A" at 5:30 A.M., then check in with the Admitting office. Any questions or running late day of surgery: call [phone]  Questions prior to your surgery date: call [phone], Monday-Friday, 8am-4pm. If you experience any cold or flu symptoms such as cough, fever, chills, shortness of breath, etc. between now and your scheduled surgery, please notify us at the above number.     Remember:  Do not eat or drink after midnight the night before your surgery  Take these medicines the morning of surgery with A SIP OF WATER  DULoxetine (CYMBALTA)  hydroxychloroquine (PLAQUENIL)  levothyroxine (SYNTHROID, LEVOTHROID)  metoprolol tartrate (LOPRESSOR)  mirabegron ER (MYRBETRIQ)   May take these medicines IF NEEDED: HYDROcodone-acetaminophen (NORCO/VICODIN    One week prior to surgery, STOP taking any Aspirin (unless otherwise instructed by your surgeon) Aleve, Naproxen, Ibuprofen, Motrin, Advil, Goody's, BC's, all herbal medications, fish oil, and non-prescription vitamins.  This includes your meloxicam (MOBIC).      WHAT DO I DO ABOUT MY DIABETES MEDICATION?   Do not take oral diabetes medicines (pills) the morning of surgery.  STOP YOUR empagliflozin (JARDIANCE) 72 HOURS PRIOR TO SURGERY, WITH THE LAST DOSE BEING 01/15/2023    The day of surgery, do not take other diabetes injectables, including Byetta (exenatide), Bydureon (exenatide ER), Victoza (liraglutide), or Trulicity (dulaglutide).   HOW TO MANAGE YOUR DIABETES BEFORE AND AFTER SURGERY  Why is it important to control my blood sugar before and after surgery? Improving blood sugar levels before and after surgery helps healing and can limit problems. A way of improving blood sugar control is eating a healthy diet by:  Eating less sugar and carbohydrates  Increasing activity/exercise  Talking with your  doctor about reaching your blood sugar goals High blood sugars (greater than 180 mg/dL) can raise your risk of infections and slow your recovery, so you will need to focus on controlling your diabetes during the weeks before surgery. Make sure that the doctor who takes care of your diabetes knows about your planned surgery including the date and location.  How do I manage my blood sugar before surgery? Check your blood sugar at least 4 times a day, starting 2 days before surgery, to make sure that the level is not too high or low.  Check your blood sugar the morning of your surgery when you wake up and every 2 hours until you get to the Short Stay unit.  If your blood sugar is less than 70 mg/dL, you will need to treat for low blood sugar: Do not take insulin. Treat a low blood sugar (less than 70 mg/dL) with  cup of clear juice (cranberry or apple), 4 glucose tablets, OR glucose gel. Recheck blood sugar in 15 minutes after treatment (to make sure it is greater than 70 mg/dL). If your blood sugar is not greater than 70 mg/dL on recheck, call [phone] for further instructions. Report your blood sugar to the short stay nurse when you get to Short Stay.  If you are admitted to the hospital after surgery: Your blood sugar will be checked by the staff and you will probably be given insulin after surgery (instead of oral diabetes medicines) to make sure you have good blood sugar levels. The goal for blood sugar control after surgery is 80-180 mg/dL.  Do NOT Smoke (Tobacco/Vaping) for 24 hours prior to your procedure.  If you use a CPAP at night, you may bring your mask/headgear for your overnight stay.   You will be asked to remove any contacts, glasses, piercing's, hearing aid's, dentures/partials prior to surgery. Please bring cases for these items if needed.    Patients discharged the day of surgery will not be allowed to drive home, and someone needs to stay with  them for 24 hours.  SURGICAL WAITING ROOM VISITATION Patients may have no more than 2 support people in the waiting area - these visitors may rotate.   Pre-op nurse will coordinate an appropriate time for 1 ADULT support person, who may not rotate, to accompany patient in pre-op.  Children under the age of 108 must have an adult with them who is not the patient and must remain in the main waiting area with an adult.  If the patient needs to stay at the hospital during part of their recovery, the visitor guidelines for inpatient rooms apply.  Please refer to the [facility] website for the visitor guidelines for any additional information.   If you received a COVID test during your pre-op visit  it is requested that you wear a mask when out in public, stay away from anyone that may not be feeling well and notify your surgeon if you develop symptoms. If you have been in contact with anyone that has tested positive in the last 10 days please notify you surgeon.      Pre-operative 5 CHG Bathing Instructions   You can play a key role in reducing the risk of infection after surgery. Your skin needs to be as free of germs as possible. You can reduce the number of germs on your skin by washing with CHG (chlorhexidine gluconate) soap before surgery. CHG is an antiseptic soap that kills germs and continues to kill germs even after washing.   DO NOT use if you have an allergy to chlorhexidine/CHG or antibacterial soaps. If your skin becomes reddened or irritated, stop using the CHG and notify one of our RNs at [phone].   Please shower with the CHG soap starting 4 days before surgery using the following schedule:     Please keep in mind the following:  DO NOT shave, including legs and underarms, starting the day of your first shower.   You may shave your face at any point before/day of surgery.  Place clean sheets on your bed the day you start using CHG soap. Use a clean washcloth (not used  since being washed) for each shower. DO NOT sleep with pets once you start using the CHG.   CHG Shower Instructions:  If you choose to wash your hair and private area, wash first with your normal shampoo/soap.  After you use shampoo/soap, rinse your hair and body thoroughly to remove shampoo/soap residue.  Turn the water OFF and apply about 3 tablespoons (45 ml) of CHG soap to a CLEAN washcloth.  Apply CHG soap ONLY FROM YOUR NECK DOWN TO YOUR TOES (washing for 3-5 minutes)  DO NOT use CHG soap on face, private areas, open wounds, or sores.  Pay special attention to the area where your surgery is being performed.  If you are having back surgery, having someone wash your back for you may be helpful. Wait 2 minutes after CHG soap is applied, then you may rinse off the CHG soap.  Pat dry with a clean towel  Put on clean  clothes/pajamas   If you choose to wear lotion, please use ONLY the CHG-compatible lotions on the back of this paper.   Additional instructions for the day of surgery: DO NOT APPLY any lotions, deodorants or perfumes.   Do not bring valuables to the hospital. [facility] is not responsible for any belongings/valuables. Do not wear nail polish, gel polish, artificial nails, or any other type of covering on natural nails (fingers and toes) Do not wear jewelry or makeup Put on clean/comfortable clothes.  Please brush your teeth.  Ask your nurse before applying any prescription medications to the skin.     CHG Compatible Lotions   Aveeno Moisturizing lotion  Cetaphil Moisturizing Cream  Cetaphil Moisturizing Lotion  Clairol Herbal Essence Moisturizing Lotion, Dry Skin  Clairol Herbal Essence Moisturizing Lotion, Extra Dry Skin  Clairol Herbal Essence Moisturizing Lotion, Normal Skin  Curel Age Defying Therapeutic Moisturizing Lotion with Alpha Hydroxy  Curel Extreme Care Body Lotion  Curel Soothing Hands Moisturizing Hand Lotion  Curel Therapeutic Moisturizing Cream,  Fragrance-Free  Curel Therapeutic Moisturizing Lotion, Fragrance-Free  Curel Therapeutic Moisturizing Lotion, Original Formula  Eucerin Daily Replenishing Lotion  Eucerin Dry Skin Therapy Plus Alpha Hydroxy Crme  Eucerin Dry Skin Therapy Plus Alpha Hydroxy Lotion  Eucerin Original Crme  Eucerin Original Lotion  Eucerin Plus Crme Eucerin Plus Lotion  Eucerin TriLipid Replenishing Lotion  Keri Anti-Bacterial Hand Lotion  Keri Deep Conditioning Original Lotion Dry Skin Formula Softly Scented  Keri Deep Conditioning Original Lotion, Fragrance Free Sensitive Skin Formula  Keri Lotion Fast Absorbing Fragrance Free Sensitive Skin Formula  Keri Lotion Fast Absorbing Softly Scented Dry Skin Formula  Keri Original Lotion  Keri Skin Renewal Lotion Keri Silky Smooth Lotion  Keri Silky Smooth Sensitive Skin Lotion  Nivea Body Creamy Conditioning Oil  Nivea Body Extra Enriched Lotion  Nivea Body Original Lotion  Nivea Body Sheer Moisturizing Lotion Nivea Crme  Nivea Skin Firming Lotion  NutraDerm 30 Skin Lotion  NutraDerm Skin Lotion  NutraDerm Therapeutic Skin Cream  NutraDerm Therapeutic Skin Lotion  ProShield Protective Hand Cream  Provon moisturizing lotion  Please read over the following fact sheets that you were given.

## 2023-01-07 ENCOUNTER — Encounter (HOSPITAL_COMMUNITY)
Admission: RE | Admit: 2023-01-07 | Discharge: 2023-01-07 | Disposition: A | Discharge status: Home / Self Care | Payer: Medicare HMO | Source: Ambulatory Visit | Attending: Neurological Surgery | Admitting: Neurological Surgery

## 2023-01-07 ENCOUNTER — Encounter (HOSPITAL_COMMUNITY): Payer: Self-pay

## 2023-01-07 ENCOUNTER — Other Ambulatory Visit: Payer: Self-pay

## 2023-01-07 VITALS — BP 157/68 | HR 64 | Temp 98.3°F | Resp 18 | Ht 61.0 in | Wt 172.5 lb

## 2023-01-07 DIAGNOSIS — H409 Unspecified glaucoma: Secondary | ICD-10-CM | POA: Diagnosis not present

## 2023-01-07 DIAGNOSIS — E039 Hypothyroidism, unspecified: Secondary | ICD-10-CM | POA: Diagnosis not present

## 2023-01-07 DIAGNOSIS — F419 Anxiety disorder, unspecified: Secondary | ICD-10-CM | POA: Insufficient documentation

## 2023-01-07 DIAGNOSIS — M069 Rheumatoid arthritis, unspecified: Secondary | ICD-10-CM | POA: Insufficient documentation

## 2023-01-07 DIAGNOSIS — K760 Fatty (change of) liver, not elsewhere classified: Secondary | ICD-10-CM | POA: Diagnosis not present

## 2023-01-07 DIAGNOSIS — M4807 Spinal stenosis, lumbosacral region: Secondary | ICD-10-CM | POA: Diagnosis not present

## 2023-01-07 DIAGNOSIS — E119 Type 2 diabetes mellitus without complications: Secondary | ICD-10-CM | POA: Insufficient documentation

## 2023-01-07 DIAGNOSIS — I10 Essential (primary) hypertension: Secondary | ICD-10-CM | POA: Diagnosis not present

## 2023-01-07 DIAGNOSIS — J45909 Unspecified asthma, uncomplicated: Secondary | ICD-10-CM | POA: Diagnosis not present

## 2023-01-07 DIAGNOSIS — Z01818 Encounter for other preprocedural examination: Secondary | ICD-10-CM | POA: Insufficient documentation

## 2023-01-07 DIAGNOSIS — G4733 Obstructive sleep apnea (adult) (pediatric): Secondary | ICD-10-CM | POA: Insufficient documentation

## 2023-01-07 LAB — CBC
HCT: 42.3 % (ref 36.0–46.0)
Hemoglobin: 14 g/dL (ref 12.0–15.0)
MCH: 31 pg (ref 26.0–34.0)
MCHC: 33.1 g/dL (ref 30.0–36.0)
MCV: 93.6 fL (ref 80.0–100.0)
Platelets: 147 10*3/uL — ABNORMAL LOW (ref 150–400)
RBC: 4.52 MIL/uL (ref 3.87–5.11)
RDW: 12.9 % (ref 11.5–15.5)
WBC: 5.3 10*3/uL (ref 4.0–10.5)
nRBC: 0 % (ref 0.0–0.2)

## 2023-01-07 LAB — GLUCOSE, CAPILLARY: Glucose-Capillary: 107 mg/dL — ABNORMAL HIGH (ref 70–99)

## 2023-01-07 LAB — BASIC METABOLIC PANEL
Anion gap: 11 (ref 5–15)
BUN: 20 mg/dL (ref 8–23)
CO2: 25 mmol/L (ref 22–32)
Calcium: 9.6 mg/dL (ref 8.9–10.3)
Chloride: 104 mmol/L (ref 98–111)
Creatinine, Ser: 0.65 mg/dL (ref 0.44–1.00)
GFR, Estimated: >60 mL/min (ref >60)
Glucose, Bld: 107 mg/dL — ABNORMAL HIGH (ref 70–99)
Potassium: 4 mmol/L (ref 3.5–5.1)
Sodium: 140 mmol/L (ref 135–145)

## 2023-01-07 LAB — PROTIME-INR
INR: 0.9 (ref 0.8–1.2)
Prothrombin Time: 12.2 s (ref 11.4–15.2)

## 2023-01-07 LAB — SURGICAL PCR SCREEN
MRSA, PCR: POSITIVE — AB
Staphylococcus aureus: POSITIVE — AB

## 2023-01-07 LAB — HEMOGLOBIN A1C
Hgb A1c MFr Bld: 5.8 % — ABNORMAL HIGH (ref 4.8–5.6)
Mean Plasma Glucose: 119.76 mg/dL

## 2023-01-07 LAB — TYPE AND SCREEN
ABO/RH(D): B POS
Antibody Screen: NEGATIVE

## 2023-01-07 NOTE — Progress Notes (Addendum)
PCP: Dr. Daniel Nones Cardiologist: Dr. Dorthey Sawyer-- f/u prn  EKG: 09/15/22 CXR: 09/15/22 1 view ECHO:09/16/22 Stress Test: 09/02/22 Cardiac Cath: reports she had one approx 20 years with Dr. Welton Flakes, but unable to remember where. Reports "everything was fine"  Sleep Study: >20 years ago. Wears CPAP nightly  Fasting Blood Sugar- 100's Checks Blood Sugar: daily  ERAS: NPO  Patient denies shortness of breath, fever, cough, and chest pain at PAT appointment. Reports she was seen this year in ED for CP, but saw a cardiologist, everything "was fine". Denies any current symptoms.  Patient verbalized understanding of instructions provided today at the PAT appointment.  Patient asked to review instructions at home and day of surgery.   Addendum: CRITICAL RESULT PROVIDER NOTIFICATION  Test performed and critical result:  Surgical PCR MRSA/MSSA POSITIVE  Date and time result received:  01/07/23, 1032  Provider name/title: Called Dr. Yetta Barre surgery scheduler, spoke with Erie Noe  Date and time provider notified: 01/07/23 10:55 AM  Date and time provider responded: 10:55 AM  Provider response:Evaluate remotely, Erie Noe will make Dr. Yetta Barre aware

## 2023-01-08 NOTE — Progress Notes (Addendum)
Anesthesia Chart Review:  Case: [id number] Date/Time: 01/19/23 0715   Procedure: PLIF - L5-S1 - Posterior Lateral and Interbody fusion (Back)   Anesthesia type: General   Pre-op diagnosis: Stenosis   [facility]: [facility] ROOM [facility] / [facility]   Surgeons: Arman Bogus, MD       DISCUSSION: Patient is a 72 year old female scheduled for the above procedure.  History includes never smoker, HTN, hypothyroidism, fatty liver, DM2, OSA (uses CPAP), asthma, RA, anxiety, migraines, glaucoma, spinal surgery (L2-4 PLIF 12/13/15; L4-5 PLIF, L2-5 posterior fixation 04/25/16; right L5-S1 laminectomy 03/01/21), osteoarthritis (reverse left TSA 03/01/20, reverse right TSA 07/03/22).  She got established with [facility] cardiologist Dr. Juliann Pares on 09/22/22 after she had an abnormal nuclear stress test on 09/02/02 showing mild anteroapical ischemia. Stress test had been ordered by her PCP Dr. Daniel Nones due to multiple CAD risk factors with recent ED visit for possible syncope (left AMA), and with anticipated lumbar surgery. She also was admitted overnight on 09/15/22 for transient hypotension in the setting of multiple medications including Micardis, oxycodone, gabapentin, and new nitric oxide regimen. She was treated with IVF. Telmisartan and oxycodone held until PCP follow-up. 09/15/22 echo showed LVEF 60-65%, only" border not optimally defined to evaluate regional wall motion, grade 1 diastolic dysfunction, elevated LA pressure, normal RV systolic function, normal PASP, trivial MR, AV with indeterminate number of cuffs with mild AV calcification, mild AR, AV sclerosis without evidence of aortic stenosis. Dr. Juliann Pares ordered a CCTA which was done on 10/27/22 and showed Coronary calcium score of 35 (50th percentile), moderate 50-69% proximal LAD stenosis, but otherwise no significant CAD. Additional FFR analysis submitted showing significant stenosis in the proximal LAD, FFRct 0.67, and cardiac catheterization was recommended. At  11/04/22 visit, he considered a stress cardiac MRI, but I don't see that one was ever done. He saw her last in follow-up on 12/08/22 and wrote, "Abnormal CT of the heart evaluation of coronary artery disease LAD 35 calcium score with FFR 0.67. Recent echocardiogram reveals left ventricular ejection fraction, by estimation, is 60 to 65%. Reportedly patient denies any cardiac-like symptoms, cardiac cath is not recommended at this time." Six month follow-up is planned, next visit is scheduled for 06/15/23.  Dr. Juliann Pares had initially cleared for back surgery on 09/22/22, but this was prior to CCTA. By last note a month ago, he was not planning to cath her despite finding of significant proximal LAD stenosis by FFTct because she was not reporting any CV symptoms. Will have anesthesiologist review.   ADDENDUM 01/09/23 5:46 PM: Reviewed with anesthesiologist Leisa Lenz, MD and will reach out to Dr. Juliann Pares for additional clarification regarding preoperative recommendations given recent abnormal cardiac testing. Dr. Juliann Pares is working in the hospital today, but I was able to speak with his nurse Marcelino Duster who will have him review. I also updated Erie Noe at Dr. Yetta Barre' office.   VS: BP (!) 157/68   Pulse 64   Temp 36.8 C   Resp 18   Ht 5\' 1"  (1.549 m)   Wt 78.2 kg   SpO2 98%   BMI 32.59 kg/m    PROVIDERS: Lynnea Ferrier, MD is PCP Dorothyann Peng, MD is cardiologist Gerrie Nordmann, MD is rheumatologist   LABS: Labs reviewed: Acceptable for surgery. AST 19, ALT 15 on 09/16/22.  (all labs ordered are listed, but only abnormal results are displayed)  Labs Reviewed  SURGICAL PCR SCREEN - Abnormal; Notable for the following components:  Result Value   MRSA, PCR POSITIVE (*)    Staphylococcus aureus POSITIVE (*)    All other components within normal limits  GLUCOSE, CAPILLARY - Abnormal; Notable for the following components:   Glucose-Capillary 107 (*)    All other components within normal  limits  BASIC METABOLIC PANEL - Abnormal; Notable for the following components:   Glucose, Bld 107 (*)    All other components within normal limits  CBC - Abnormal; Notable for the following components:   Platelets 147 (*)    All other components within normal limits  HEMOGLOBIN A1C - Abnormal; Notable for the following components:   Hgb A1c MFr Bld 5.8 (*)    All other components within normal limits  PROTIME-INR  TYPE AND SCREEN     IMAGES: CT Chest (over read CCTA) 10/27/22: IMPRESSION: 1. Benign 3 mm right middle lobe nodule needing no further imaging follow-up. 2.  Aortic Atherosclerosis (ICD10-I70.0).  MRI L-spine 10/17/22: IMPRESSION: At L5-S1 there is a broad-based disc bulge with severe lateral recess stenosis left worse than the right. Mass effect on the descending nerve roots on the left. Mild right and moderate left neural foraminal stenosis. Right hemilaminectomy. Moderate facet joint arthropathy.  CT Head 09/15/22: IMPRESSION: 1. No evidence of acute intracranial abnormality. 2. Mild-to-moderate chronic small vessel ischemic disease.   EKG: 09/15/22:  Sinus rhythm Ventricular premature complex Aberrant conduction of SV complex(es) Short PR interval Low voltage, precordial leads Baseline wander in lead(s) II III aVF Confirmed by UNCONFIRMED, DOCTOR ([id number]), editor Lonell Face [id number]) on 09/15/2022 11:24:41 AM   CV: CT Coronary 10/27/22: FINDINGS: - Aorta: Normal size. Ascending and descending aorta calcifications. No dissection. - Aortic Valve:  Trileaflet.  No calcifications. - Coronary Arteries:  Normal coronary origin.  Right dominance. - RCA is a dominant artery. There is no plaque. - Left main gives rise to LAD and LCX arteries. LM has no disease. - LAD has calcified and non calcified plaque in the proximal LAD causing moderate stenosis (50-69%). - LCX is a non-dominant artery.  There is no plaque. - Other findings: Normal pulmonary vein drainage  into the left atrium. Normal left atrial appendage without a thrombus. Normal size of the pulmonary artery.  IMPRESSION: 1. Coronary calcium score of 35. This was 50th percentile for age and sex matched control. 2. Normal coronary origin with right dominance. 3. Moderate proximal LAD stenosis (50-69%). 4. CAD-RADS 3. Moderate stenosis. Consider symptom-guided anti-ischemic pharmacotherapy as well as risk factor modification per guideline directed care. 5. Additional analysis with CT FFR will be submitted.  FFR: 1. Left Main:  No significant stenosis. 2. LAD: significant stenosis in the proximal LAD.  FFRct 0.67 3. LCX: No significant stenosis.  FFRct 0.96 4. RCA: No significant stenosis.  FFRct 0.94   IMPRESSION: 1. CT FFR analysis showed significant stenosis in the proximal LAD. FFRct 0.67 2.  Cardiac catheterization recommended.    Echocardiogram 2D complete 09/15/2022: IMPRESSIONS  1. Left ventricular ejection fraction, by estimation, is 60 to 65%. The left ventricle has normal function. Left ventricular endocardial border not optimally defined to evaluate regional wall motion. Left ventricular diastolic parameters are consistent  with Grade I diastolic dysfunction (impaired relaxation). Elevated left atrial pressure.  2. Right ventricular systolic function is normal. The right ventricular size is normal. There is normal pulmonary artery systolic pressure.  3. The mitral valve is normal in structure. Trivial mitral valve regurgitation. No evidence of mitral stenosis.  4. The aortic valve has an indeterminant  number of cusps. There is mild calcification of the aortic valve. There is mild thickening of the aortic valve. Aortic valve regurgitation is mild. Aortic valve sclerosis/calcification is present, without any  evidence of aortic stenosis.  5. There is borderline dilatation of the ascending aorta, measuring 36 mm.  6. The inferior vena cava is normal in size with <50%  respiratory variability, suggesting right atrial pressure of 8 mmHg.    Nuclear stress test 09/02/22 ([facility]): 1.  Normal left ventricular function  2.  Normal wall motion  3.  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   a.) preoperative PCR (+) on 02/14/2021 prior to LUMBAR MICRODISCECTOMY; b.) preoperative PCR (+) 06/24/2022 prior to REVERSE SHOULDER ARTHROPLASTY W/ BICEPS TENODESIS   Obesity    OSA on CPAP    Plantar fasciitis    right   Pneumonia    Renal calculi    Rheumatoid arthritis (HCC)    RLS (restless legs syndrome) 08/23/2014   T2DM (type 2 diabetes mellitus) (HCC)     Past Surgical History:  Procedure Laterality Date   ABDOMINAL HYSTERECTOMY     partial   APPENDECTOMY     Arthroscopic surgery, knee Left    BREAST BIOPSY Right    several   BREAST BIOPSY Right 03/11/2017   u/s bx neg   BUNIONECTOMY     LEFT    COLONOSCOPY W/ POLYPECTOMY     COLONOSCOPY WITH PROPOFOL N/A 08/14/2017   Procedure: COLONOSCOPY WITH PROPOFOL;  Surgeon: Christena Deem, MD;  Location: [facility] ENDOSCOPY;  Service: Endoscopy;  Laterality: N/A;   EXTRACORPOREAL SHOCK WAVE LITHOTRIPSY Left 05/14/2017   Procedure: EXTRACORPOREAL SHOCK WAVE LITHOTRIPSY (ESWL);  Surgeon: Riki Altes, MD;  Location: [facility] ORS;  Service: Urology;  Laterality: Left;   EXTRACORPOREAL SHOCK WAVE LITHOTRIPSY Left 06/14/2020   Procedure: EXTRACORPOREAL SHOCK WAVE LITHOTRIPSY (ESWL);  Surgeon: Sondra Come, MD;  Location: [facility] ORS;  Service: Urology;   Laterality: Left;   FINGER SURGERY Right    FOOT SURGERY     RIGHT     KNEE ARTHROSCOPY W/ MENISCAL REPAIR Left    LASIK     LUMBAR LAMINECTOMY/DECOMPRESSION MICRODISCECTOMY Right 03/01/2021   Procedure: Microdiscectomy - Lumbar five-Sacral one - right;  Surgeon: Tia Alert, MD;  Location: [facility] OR;  Service: Neurosurgery;  Laterality: Right;   MAXIMUM ACCESS (MAS)POSTERIOR LUMBAR INTERBODY FUSION (PLIF) 1 LEVEL N/A 04/25/2016   Procedure: LUMBAR FOUR-FIVE  MAXIMUM ACCESS (MAS) POSTERIOR LUMBAR INTERBODY FUSION (PLIF) with extension of instrumentation LUMBAR TWO-FIVE;  Surgeon: Tia Alert, MD;  Location[facility] OR;  Service: Neurosurgery;  Laterality: N/A;   MAXIMUM ACCESS (MAS)POSTERIOR LUMBAR INTERBODY FUSION (PLIF) 2 LEVEL N/A 12/13/2015   Procedure: Lumbar two-three - Lumbar three-four MAXIMUM ACCESS (MAS) POSTERIOR LUMBAR INTERBODY FUSION (PLIF)  ;  Surgeon: Tia Alert, MD;  Location: [facility] NEURO ORS;  Service: Neurosurgery;  Laterality: N/A;   REVERSE SHOULDER ARTHROPLASTY Left 03/01/2020   Procedure: REVERSE SHOULDER ARTHROPLASTY;  Surgeon: Christena Flake, MD;  Location: [facility] ORS;  Service: Orthopedics;  Laterality: Left;   REVERSE SHOULDER ARTHROPLASTY Right 07/03/2022   Procedure: REVERSE SHOULDER ARTHROPLASTY W/ BICEPS TENODESIS.;  Surgeon: Christena Flake, MD;  Location: [facility] ORS;  Service: Orthopedics;  Laterality: Right;   SHOULDER SURGERY     RIGHT    TONSILLECTOMY      MEDICATIONS:  alendronate (FOSAMAX) 70 MG tablet   Apoaequorin (PREVAGEN PO)   aspirin 81 MG EC tablet   DULoxetine (CYMBALTA) 60 MG capsule   empagliflozin (JARDIANCE) 10 MG TABS tablet   gabapentin (NEURONTIN) 800 MG tablet   HYDROcodone-acetaminophen (NORCO/VICODIN) 5-325 MG tablet   hydroxychloroquine (PLAQUENIL) 200 MG tablet   levothyroxine (SYNTHROID, LEVOTHROID) 112 MCG tablet   meloxicam (MOBIC) 7.5 MG tablet   metoprolol tartrate (LOPRESSOR) 100 MG tablet   mirabegron ER (MYRBETRIQ) 25 MG TB24  tablet   NON FORMULARY   pravastatin (PRAVACHOL) 20 MG tablet   rOPINIRole (REQUIP) 4 MG tablet   telmisartan (MICARDIS) 80 MG tablet   No current facility-administered medications for this encounter.  Advised to hold empagliflozin for 72 hours prior to surgery.    Shonna Chock, PA-C Surgical Short Stay/Anesthesiology [facility] Phone [phone] [facility] Phone [phone] 01/08/2023 6:25 PM

## 2023-01-13 NOTE — Anesthesia Preprocedure Evaluation (Addendum)
Anesthesia Evaluation  Patient identified by MRN, date of birth, ID band Patient awake    Reviewed: Allergy & Precautions, NPO status , Patient's Chart, lab work & pertinent test results, reviewed documented beta blocker date and time   Airway Mallampati: II  TM Distance: >3 FB Neck ROM: Full    Dental  (+) Dental Advisory Given, Chipped,    Pulmonary asthma , sleep apnea and Continuous Positive Airway Pressure Ventilation    Pulmonary exam normal breath sounds clear to auscultation       Cardiovascular hypertension, Pt. on home beta blockers and Pt. on medications + CAD  Normal cardiovascular exam Rhythm:Regular Rate:Normal  TTE 2024  1. Left ventricular ejection fraction, by estimation, is 60 to 65%. The  left ventricle has normal function. Left ventricular endocardial border  not optimally defined to evaluate regional wall motion. Left ventricular  diastolic parameters are consistent  with Grade I diastolic dysfunction (impaired relaxation). Elevated left  atrial pressure.   2. Right ventricular systolic function is normal. The right ventricular  size is normal. There is normal pulmonary artery systolic pressure.   3. The mitral valve is normal in structure. Trivial mitral valve  regurgitation. No evidence of mitral stenosis.   4. The aortic valve has an indeterminant number of cusps. There is mild  calcification of the aortic valve. There is mild thickening of the aortic  valve. Aortic valve regurgitation is mild. Aortic valve  sclerosis/calcification is present, without any  evidence of aortic stenosis.   5. There is borderline dilatation of the ascending aorta, measuring 36  mm.   6. The inferior vena cava is normal in size with <50% respiratory  variability, suggesting right atrial pressure of 8 mmHg.     Neuro/Psych  Headaches PSYCHIATRIC DISORDERS Anxiety Depression       GI/Hepatic negative GI ROS, Neg liver  ROS,,,  Endo/Other  diabetes, Type 2, Oral Hypoglycemic AgentsHypothyroidism    Renal/GU negative Renal ROS  negative genitourinary   Musculoskeletal  (+) Arthritis , Rheumatoid disorders,    Abdominal   Peds  Hematology negative hematology ROS (+)   Anesthesia Other Findings   Reproductive/Obstetrics                             Anesthesia Physical Anesthesia Plan  ASA: 3  Anesthesia Plan: General   Post-op Pain Management: Tylenol PO (pre-op)*   Induction: Intravenous  PONV Risk Score and Plan: 3 and Dexamethasone, Ondansetron and Treatment may vary due to age or medical condition  Airway Management Planned: Oral ETT  Additional Equipment:   Intra-op Plan:   Post-operative Plan: Extubation in OR  Informed Consent: I have reviewed the patients History and Physical, chart, labs and discussed the procedure including the risks, benefits and alternatives for the proposed anesthesia with the patient or authorized representative who has indicated his/her understanding and acceptance.     Dental advisory given  Plan Discussed with: CRNA  Anesthesia Plan Comments: (See PAT note written by Shonna Chock, PA-C. Cardiologist is Dr. Dorothyann Peng with [facility]. At 12/08/22 visit, he felt cardiac cath was not indicated. This was confirmed on 01/12/23.    CT Coronary 10/27/22: FINDINGS: - Aorta: Normal size. Ascending and descending aorta calcifications. No dissection. - Aortic Valve:  Trileaflet.  No calcifications. - Coronary Arteries:  Normal coronary origin.  Right dominance. - RCA is a dominant artery. There is no plaque. - Left main gives rise to  LAD and LCX arteries. LM has no disease. - LAD has calcified and non calcified plaque in the proximal LAD causing moderate stenosis (50-69%). - LCX is a non-dominant artery.  There is no plaque. - Other findings: Normal pulmonary vein drainage into the left atrium. Normal left atrial  appendage without a thrombus. Normal size of the pulmonary artery.   IMPRESSION: 1. Coronary calcium score of 35. This was 50th percentile for age and sex matched control. 2. Normal coronary origin with right dominance. 3. Moderate proximal LAD stenosis (50-69%). 4. CAD-RADS 3. Moderate stenosis. Consider symptom-guided anti-ischemic pharmacotherapy as well as risk factor modification per guideline directed care. 5. Additional analysis with CT FFR will be submitted.   FFR: 1. Left Main:  No significant stenosis. 2. LAD: significant stenosis in the proximal LAD.  FFRct 0.67 3. LCX: No significant stenosis.  FFRct 0.96 4. RCA: No significant stenosis.  FFRct 0.94   IMPRESSION: 1. CT FFR analysis showed significant stenosis in the proximal LAD. FFRct 0.67 2.  Cardiac catheterization recommended.     Echocardiogram 2D complete 09/15/2022: IMPRESSIONS  1. Left ventricular ejection fraction, by estimation, is 60 to 65%. The left ventricle has normal function. Left ventricular endocardial border not optimally defined to evaluate regional wall motion. Left ventricular diastolic parameters are consistent  with Grade I diastolic dysfunction (impaired relaxation). Elevated left atrial pressure.  2. Right ventricular systolic function is normal. The right ventricular size is normal. There is normal pulmonary artery systolic pressure.  3. The mitral valve is normal in structure. Trivial mitral valve regurgitation. No evidence of mitral stenosis.  4. The aortic valve has an indeterminant number of cusps. There is mild calcification of the aortic valve. There is mild thickening of the aortic valve. Aortic valve regurgitation is mild. Aortic valve sclerosis/calcification is present, without any  evidence of aortic stenosis.  5. There is borderline dilatation of the ascending aorta, measuring 36 mm.  6. The inferior vena cava is normal in size with <50% respiratory variability, suggesting right  atrial pressure of 8 mmHg.    Nuclear stress test 09/02/22 ([facility]): 1.  Normal left ventricular function  2.  Normal wall motion  3.  Mild anteroapical ischemia  )       Anesthesia Quick Evaluation

## 2023-01-19 ENCOUNTER — Other Ambulatory Visit: Payer: Self-pay

## 2023-01-19 ENCOUNTER — Ambulatory Visit (HOSPITAL_COMMUNITY): Payer: Medicare HMO | Admitting: Anesthesiology

## 2023-01-19 ENCOUNTER — Inpatient Hospital Stay (HOSPITAL_COMMUNITY): Payer: Medicare HMO

## 2023-01-19 ENCOUNTER — Encounter (HOSPITAL_COMMUNITY)
Admission: RE | Disposition: A | Discharge status: Home / Self Care | Payer: Self-pay | Source: Home / Self Care | Attending: Neurological Surgery

## 2023-01-19 ENCOUNTER — Observation Stay (HOSPITAL_COMMUNITY)
Admission: RE | Admit: 2023-01-19 | Discharge: 2023-01-20 | Disposition: A | Discharge status: Home / Self Care | Payer: Medicare HMO | Attending: Neurological Surgery | Admitting: Neurological Surgery

## 2023-01-19 ENCOUNTER — Ambulatory Visit (HOSPITAL_COMMUNITY): Payer: Medicare HMO | Admitting: Physician Assistant

## 2023-01-19 ENCOUNTER — Encounter (HOSPITAL_COMMUNITY): Payer: Self-pay | Admitting: Neurological Surgery

## 2023-01-19 DIAGNOSIS — Z96612 Presence of left artificial shoulder joint: Secondary | ICD-10-CM | POA: Diagnosis not present

## 2023-01-19 DIAGNOSIS — E039 Hypothyroidism, unspecified: Secondary | ICD-10-CM | POA: Diagnosis not present

## 2023-01-19 DIAGNOSIS — J45909 Unspecified asthma, uncomplicated: Secondary | ICD-10-CM | POA: Insufficient documentation

## 2023-01-19 DIAGNOSIS — I10 Essential (primary) hypertension: Secondary | ICD-10-CM | POA: Diagnosis not present

## 2023-01-19 DIAGNOSIS — E119 Type 2 diabetes mellitus without complications: Secondary | ICD-10-CM | POA: Diagnosis not present

## 2023-01-19 DIAGNOSIS — M4807 Spinal stenosis, lumbosacral region: Secondary | ICD-10-CM | POA: Diagnosis not present

## 2023-01-19 DIAGNOSIS — Z7982 Long term (current) use of aspirin: Secondary | ICD-10-CM | POA: Diagnosis not present

## 2023-01-19 DIAGNOSIS — Z981 Arthrodesis status: Secondary | ICD-10-CM

## 2023-01-19 DIAGNOSIS — I671 Cerebral aneurysm, nonruptured: Principal | ICD-10-CM | POA: Diagnosis present

## 2023-01-19 DIAGNOSIS — Z96611 Presence of right artificial shoulder joint: Secondary | ICD-10-CM | POA: Diagnosis not present

## 2023-01-19 DIAGNOSIS — Z79899 Other long term (current) drug therapy: Secondary | ICD-10-CM | POA: Diagnosis not present

## 2023-01-19 DIAGNOSIS — M4317 Spondylolisthesis, lumbosacral region: Secondary | ICD-10-CM | POA: Diagnosis present

## 2023-01-19 DIAGNOSIS — Z01818 Encounter for other preprocedural examination: Secondary | ICD-10-CM

## 2023-01-19 LAB — GLUCOSE, CAPILLARY
Glucose-Capillary: 96 mg/dL (ref 70–99)
Glucose-Capillary: 114 mg/dL — ABNORMAL HIGH (ref 70–99)
Glucose-Capillary: 158 mg/dL — ABNORMAL HIGH (ref 70–99)

## 2023-01-19 SURGERY — POSTERIOR LUMBAR FUSION 1 LEVEL
Anesthesia: General | Site: Back

## 2023-01-19 MED ORDER — MENTHOL 3 MG MT LOZG: 1.0000 | LOZENGE | OROMUCOSAL | Status: DC | PRN

## 2023-01-19 MED ORDER — ORAL CARE MOUTH RINSE: 15.0000 mL | Freq: Once | OROMUCOSAL | Status: AC

## 2023-01-19 MED ORDER — DEXAMETHASONE SODIUM PHOSPHATE 10 MG/ML IJ SOLN
INTRAMUSCULAR | Status: DC | PRN
  Administered 2023-01-19: 10 mg via INTRAVENOUS

## 2023-01-19 MED ORDER — VANCOMYCIN HCL IN DEXTROSE 1-5 GM/200ML-% IV SOLN
1000.0000 mg | Freq: Once | INTRAVENOUS | Status: AC
  Administered 2023-01-19: 1000 mg via INTRAVENOUS
  Filled 2023-01-19: qty 200

## 2023-01-19 MED ORDER — APOAEQUORIN 10 MG PO CAPS: ORAL_CAPSULE | Freq: Every day | ORAL | Status: DC

## 2023-01-19 MED ORDER — METHOCARBAMOL 500 MG PO TABS
500.0000 mg | ORAL_TABLET | Freq: Four times a day (QID) | ORAL | Status: DC | PRN
  Administered 2023-01-19 – 2023-01-20 (×3): 500 mg via ORAL
  Filled 2023-01-19 (×3): qty 1

## 2023-01-19 MED ORDER — SENNA 8.6 MG PO TABS
1.0000 | ORAL_TABLET | Freq: Two times a day (BID) | ORAL | Status: DC
  Administered 2023-01-19 – 2023-01-20 (×2): 8.6 mg via ORAL
  Filled 2023-01-19 (×2): qty 1

## 2023-01-19 MED ORDER — PHENOL 1.4 % MT LIQD: 1.0000 | OROMUCOSAL | Status: DC | PRN

## 2023-01-19 MED ORDER — CHLORHEXIDINE GLUCONATE CLOTH 2 % EX PADS: 6.0000 | MEDICATED_PAD | Freq: Once | CUTANEOUS | Status: DC

## 2023-01-19 MED ORDER — CEFAZOLIN SODIUM-DEXTROSE 2-4 GM/100ML-% IV SOLN
2.0000 g | INTRAVENOUS | Status: AC
  Administered 2023-01-19: 2 g via INTRAVENOUS
  Filled 2023-01-19: qty 100

## 2023-01-19 MED ORDER — THROMBIN 20000 UNITS EX SOLR
CUTANEOUS | Status: DC | PRN
  Administered 2023-01-19: 20 mL via TOPICAL

## 2023-01-19 MED ORDER — SURGIRINSE WOUND IRRIGATION SYSTEM - OPTIME
TOPICAL | Status: DC | PRN
  Administered 2023-01-19: 450 mL via TOPICAL

## 2023-01-19 MED ORDER — HYDROXYCHLOROQUINE SULFATE 200 MG PO TABS
200.0000 mg | ORAL_TABLET | Freq: Two times a day (BID) | ORAL | Status: DC
  Administered 2023-01-19 – 2023-01-20 (×2): 200 mg via ORAL
  Filled 2023-01-19 (×3): qty 1

## 2023-01-19 MED ORDER — LACTATED RINGERS IV SOLN: INTRAVENOUS | Status: DC

## 2023-01-19 MED ORDER — SODIUM CHLORIDE 0.9 % IV SOLN
250.0000 mL | INTRAVENOUS | Status: DC
  Administered 2023-01-19: 250 mL via INTRAVENOUS

## 2023-01-19 MED ORDER — PHENYLEPHRINE 80 MCG/ML (10ML) SYRINGE FOR IV PUSH (FOR BLOOD PRESSURE SUPPORT)
PREFILLED_SYRINGE | INTRAVENOUS | Status: DC | PRN
  Administered 2023-01-19: 160 ug via INTRAVENOUS

## 2023-01-19 MED ORDER — PROPOFOL 10 MG/ML IV BOLUS
INTRAVENOUS | Status: DC | PRN
  Administered 2023-01-19: 100 mg via INTRAVENOUS

## 2023-01-19 MED ORDER — SODIUM CHLORIDE 0.9% FLUSH
3.0000 mL | Freq: Two times a day (BID) | INTRAVENOUS | Status: DC
  Administered 2023-01-19 (×2): 3 mL via INTRAVENOUS

## 2023-01-19 MED ORDER — CELECOXIB 200 MG PO CAPS
200.0000 mg | ORAL_CAPSULE | Freq: Two times a day (BID) | ORAL | Status: DC
  Administered 2023-01-19 – 2023-01-20 (×2): 200 mg via ORAL
  Filled 2023-01-19 (×2): qty 1

## 2023-01-19 MED ORDER — HYDROMORPHONE HCL 1 MG/ML IJ SOLN
INTRAMUSCULAR | Status: DC | PRN
Start: 2023-01-19 — End: 2023-01-19
  Administered 2023-01-19 (×2): .5 mg via INTRAVENOUS

## 2023-01-19 MED ORDER — ACETAMINOPHEN 325 MG PO TABS: 650.0000 mg | ORAL_TABLET | ORAL | Status: DC | PRN

## 2023-01-19 MED ORDER — FENTANYL CITRATE (PF) 250 MCG/5ML IJ SOLN
INTRAMUSCULAR | Status: DC | PRN
  Administered 2023-01-19 (×5): 50 ug via INTRAVENOUS

## 2023-01-19 MED ORDER — PROPOFOL 10 MG/ML IV BOLUS
INTRAVENOUS | Status: AC
  Filled 2023-01-19: qty 20

## 2023-01-19 MED ORDER — ROPINIROLE HCL 1 MG PO TABS
4.0000 mg | ORAL_TABLET | Freq: Two times a day (BID) | ORAL | Status: DC
  Administered 2023-01-19 – 2023-01-20 (×2): 4 mg via ORAL
  Filled 2023-01-19 (×2): qty 4

## 2023-01-19 MED ORDER — 0.9 % SODIUM CHLORIDE (POUR BTL) OPTIME
TOPICAL | Status: DC | PRN
  Administered 2023-01-19: 1000 mL

## 2023-01-19 MED ORDER — ROCURONIUM BROMIDE 10 MG/ML (PF) SYRINGE
PREFILLED_SYRINGE | INTRAVENOUS | Status: DC | PRN
  Administered 2023-01-19: 30 mg via INTRAVENOUS
  Administered 2023-01-19: 20 mg via INTRAVENOUS
  Administered 2023-01-19: 50 mg via INTRAVENOUS

## 2023-01-19 MED ORDER — SODIUM CHLORIDE 0.9% FLUSH: 3.0000 mL | INTRAVENOUS | Status: DC | PRN

## 2023-01-19 MED ORDER — POTASSIUM CHLORIDE IN NACL 20-0.9 MEQ/L-% IV SOLN: INTRAVENOUS | Status: DC

## 2023-01-19 MED ORDER — FENTANYL CITRATE (PF) 250 MCG/5ML IJ SOLN
INTRAMUSCULAR | Status: AC
  Filled 2023-01-19: qty 5

## 2023-01-19 MED ORDER — CHLORHEXIDINE GLUCONATE 0.12 % MT SOLN
15.0000 mL | Freq: Once | OROMUCOSAL | Status: AC
  Administered 2023-01-19: 15 mL via OROMUCOSAL
  Filled 2023-01-19: qty 15

## 2023-01-19 MED ORDER — BUPIVACAINE HCL (PF) 0.25 % IJ SOLN
INTRAMUSCULAR | Status: DC | PRN
  Administered 2023-01-19: 7 mL

## 2023-01-19 MED ORDER — CEFAZOLIN SODIUM-DEXTROSE 2-4 GM/100ML-% IV SOLN
2.0000 g | Freq: Three times a day (TID) | INTRAVENOUS | Status: AC
  Administered 2023-01-19 (×2): 2 g via INTRAVENOUS
  Filled 2023-01-19 (×2): qty 100

## 2023-01-19 MED ORDER — ONDANSETRON HCL 4 MG/2ML IJ SOLN: 4.0000 mg | Freq: Four times a day (QID) | INTRAMUSCULAR | Status: DC | PRN

## 2023-01-19 MED ORDER — HYDROCODONE-ACETAMINOPHEN 5-325 MG PO TABS
1.0000 | ORAL_TABLET | ORAL | Status: DC | PRN
  Administered 2023-01-19 – 2023-01-20 (×4): 1 via ORAL
  Filled 2023-01-19 (×4): qty 1

## 2023-01-19 MED ORDER — IRBESARTAN 75 MG PO TABS
75.0000 mg | ORAL_TABLET | Freq: Every day | ORAL | Status: DC
  Administered 2023-01-20: 75 mg via ORAL
  Filled 2023-01-19: qty 1

## 2023-01-19 MED ORDER — ACETAMINOPHEN 650 MG RE SUPP: 650.0000 mg | RECTAL | Status: DC | PRN

## 2023-01-19 MED ORDER — HYDROMORPHONE HCL 1 MG/ML IJ SOLN
INTRAMUSCULAR | Status: AC
  Filled 2023-01-19: qty 0.5

## 2023-01-19 MED ORDER — MIRABEGRON ER 25 MG PO TB24
25.0000 mg | ORAL_TABLET | Freq: Every day | ORAL | Status: DC
  Administered 2023-01-20: 25 mg via ORAL
  Filled 2023-01-19: qty 1

## 2023-01-19 MED ORDER — THROMBIN 5000 UNITS EX SOLR
OROMUCOSAL | Status: DC | PRN
  Administered 2023-01-19: 5 mL via TOPICAL

## 2023-01-19 MED ORDER — HYDROMORPHONE HCL 1 MG/ML IJ SOLN: 0.2500 mg | INTRAMUSCULAR | Status: DC | PRN

## 2023-01-19 MED ORDER — LIDOCAINE 2% (20 MG/ML) 5 ML SYRINGE
INTRAMUSCULAR | Status: DC | PRN
  Administered 2023-01-19: 60 mg via INTRAVENOUS

## 2023-01-19 MED ORDER — THROMBIN 5000 UNITS EX SOLR
CUTANEOUS | Status: AC
  Filled 2023-01-19: qty 5000

## 2023-01-19 MED ORDER — LEVOTHYROXINE SODIUM 112 MCG PO TABS
112.0000 ug | ORAL_TABLET | Freq: Every day | ORAL | Status: DC
  Administered 2023-01-20: 112 ug via ORAL
  Filled 2023-01-19: qty 1

## 2023-01-19 MED ORDER — ONDANSETRON HCL 4 MG PO TABS: 4.0000 mg | ORAL_TABLET | Freq: Four times a day (QID) | ORAL | Status: DC | PRN

## 2023-01-19 MED ORDER — DULOXETINE HCL 30 MG PO CPEP
60.0000 mg | ORAL_CAPSULE | Freq: Every day | ORAL | Status: DC
  Administered 2023-01-20: 60 mg via ORAL
  Filled 2023-01-19: qty 2

## 2023-01-19 MED ORDER — GABAPENTIN 300 MG PO CAPS
300.0000 mg | ORAL_CAPSULE | ORAL | Status: AC
  Administered 2023-01-19: 300 mg via ORAL
  Filled 2023-01-19: qty 1

## 2023-01-19 MED ORDER — ONDANSETRON HCL 4 MG/2ML IJ SOLN
INTRAMUSCULAR | Status: DC | PRN
  Administered 2023-01-19: 4 mg via INTRAVENOUS

## 2023-01-19 MED ORDER — THROMBIN 20000 UNITS EX SOLR
CUTANEOUS | Status: AC
  Filled 2023-01-19: qty 20000

## 2023-01-19 MED ORDER — METHOCARBAMOL 1000 MG/10ML IJ SOLN: 500.0000 mg | Freq: Four times a day (QID) | INTRAVENOUS | Status: DC | PRN

## 2023-01-19 MED ORDER — ASPIRIN 81 MG PO TBEC
81.0000 mg | DELAYED_RELEASE_TABLET | Freq: Every day | ORAL | Status: DC
  Administered 2023-01-20: 81 mg via ORAL
  Filled 2023-01-19: qty 1

## 2023-01-19 MED ORDER — SUGAMMADEX SODIUM 200 MG/2ML IV SOLN
INTRAVENOUS | Status: DC | PRN
  Administered 2023-01-19: 200 mg via INTRAVENOUS

## 2023-01-19 MED ORDER — ACETAMINOPHEN 500 MG PO TABS
1000.0000 mg | ORAL_TABLET | ORAL | Status: AC
  Administered 2023-01-19: 1000 mg via ORAL
  Filled 2023-01-19: qty 2

## 2023-01-19 MED ORDER — PHENYLEPHRINE HCL-NACL 20-0.9 MG/250ML-% IV SOLN
INTRAVENOUS | Status: DC | PRN
  Administered 2023-01-19: 25 ug/min via INTRAVENOUS

## 2023-01-19 MED ORDER — BUPIVACAINE HCL (PF) 0.25 % IJ SOLN
INTRAMUSCULAR | Status: AC
  Filled 2023-01-19: qty 30

## 2023-01-19 SURGICAL SUPPLY — 63 items
ADH SKN CLS APL DERMABOND .7 (GAUZE/BANDAGES/DRESSINGS) ×1
APL SKNCLS STERI-STRIP NONHPOA (GAUZE/BANDAGES/DRESSINGS) ×1
BAG COUNTER SPONGE SURGICOUNT (BAG) ×1 IMPLANT
BAG SPNG CNTER NS LX DISP (BAG) ×1
BASKET BONE COLLECTION (BASKET) ×1 IMPLANT
BENZOIN TINCTURE PRP APPL 2/3 (GAUZE/BANDAGES/DRESSINGS) ×1 IMPLANT
BLADE BONE MILL MEDIUM (MISCELLANEOUS) ×1 IMPLANT
BLADE CLIPPER SURG (BLADE) IMPLANT
BUR CARBIDE MATCH 3.0 (BURR) ×1 IMPLANT
CAGE SABLE 10X22 6-12 8D (Cage) IMPLANT
CANISTER SUCT 3000ML PPV (MISCELLANEOUS) ×1 IMPLANT
CAP RELINE MOD TULIP RMM (Cap) IMPLANT
CNTNR URN SCR LID CUP LEK RST (MISCELLANEOUS) ×1 IMPLANT
CONT SPEC 4OZ STRL OR WHT (MISCELLANEOUS) ×1
COVER BACK TABLE 60X90IN (DRAPES) ×1 IMPLANT
DERMABOND ADVANCED .7 DNX12 (GAUZE/BANDAGES/DRESSINGS) ×1 IMPLANT
DRAPE C-ARM 42X72 X-RAY (DRAPES) ×2 IMPLANT
DRAPE C-ARMOR (DRAPES) ×1 IMPLANT
DRAPE LAPAROTOMY 100X72X124 (DRAPES) ×1 IMPLANT
DRAPE SURG 17X23 STRL (DRAPES) ×1 IMPLANT
DRSG OPSITE POSTOP 4X6 (GAUZE/BANDAGES/DRESSINGS) IMPLANT
DURAPREP 26ML APPLICATOR (WOUND CARE) ×1 IMPLANT
ELECT REM PT RETURN 9FT ADLT (ELECTROSURGICAL) ×1
ELECTRODE REM PT RTRN 9FT ADLT (ELECTROSURGICAL) ×1 IMPLANT
EVACUATOR 1/8 PVC DRAIN (DRAIN) ×1 IMPLANT
FIBER BONE ALLOSYNC EXPAND 5 (Bone Implant) IMPLANT
GAUZE 4X4 16PLY ~~LOC~~+RFID DBL (SPONGE) IMPLANT
GLOVE BIO SURGEON STRL SZ7 (GLOVE) IMPLANT
GLOVE BIO SURGEON STRL SZ8 (GLOVE) ×2 IMPLANT
GLOVE BIOGEL PI IND STRL 7.0 (GLOVE) IMPLANT
GOWN STRL REUS W/ TWL LRG LVL3 (GOWN DISPOSABLE) IMPLANT
GOWN STRL REUS W/ TWL XL LVL3 (GOWN DISPOSABLE) ×2 IMPLANT
GOWN STRL REUS W/TWL 2XL LVL3 (GOWN DISPOSABLE) IMPLANT
GOWN STRL REUS W/TWL LRG LVL3 (GOWN DISPOSABLE) ×1
GOWN STRL REUS W/TWL XL LVL3 (GOWN DISPOSABLE) ×2
HEMOSTAT POWDER KIT SURGIFOAM (HEMOSTASIS) ×1 IMPLANT
KIT BASIN OR (CUSTOM PROCEDURE TRAY) ×1 IMPLANT
KIT GRAFTMAG DEL NEURO DISP (NEUROSURGERY SUPPLIES) IMPLANT
KIT INFUSE XX SMALL 0.7CC (Orthopedic Implant) IMPLANT
KIT POSITION SURG JACKSON T1 (MISCELLANEOUS) ×1 IMPLANT
KIT TURNOVER KIT B (KITS) ×1 IMPLANT
MILL BONE PREP (MISCELLANEOUS) ×1 IMPLANT
NDL HYPO 25X1 1.5 SAFETY (NEEDLE) ×1 IMPLANT
NEEDLE HYPO 25X1 1.5 SAFETY (NEEDLE) ×1 IMPLANT
NS IRRIG 1000ML POUR BTL (IV SOLUTION) ×1 IMPLANT
PACK LAMINECTOMY NEURO (CUSTOM PROCEDURE TRAY) ×1 IMPLANT
PAD ARMBOARD 7.5X6 YLW CONV (MISCELLANEOUS) ×3 IMPLANT
ROD RELINE-O COCR 5.0X55MM (Rod) IMPLANT
SCREW SHANK RELINE 6.5X40MM (Screw) IMPLANT
SHANK RELINE MOD 5.5X40 (Screw) IMPLANT
SOLUTION IRRIG SURGIPHOR (IV SOLUTION) ×1 IMPLANT
SPONGE SURGIFOAM ABS GEL 100 (HEMOSTASIS) ×1 IMPLANT
SPONGE T-LAP 4X18 ~~LOC~~+RFID (SPONGE) IMPLANT
STRIP CLOSURE SKIN 1/2X4 (GAUZE/BANDAGES/DRESSINGS) ×2 IMPLANT
SUT VIC AB 0 CT1 18XCR BRD8 (SUTURE) ×1 IMPLANT
SUT VIC AB 0 CT1 8-18 (SUTURE) ×1
SUT VIC AB 2-0 CP2 18 (SUTURE) ×1 IMPLANT
SUT VIC AB 3-0 SH 8-18 (SUTURE) ×2 IMPLANT
TOWEL GREEN STERILE (TOWEL DISPOSABLE) ×1 IMPLANT
TOWEL GREEN STERILE FF (TOWEL DISPOSABLE) ×1 IMPLANT
TRAY FOLEY MTR SLVR 14FR STAT (SET/KITS/TRAYS/PACK) IMPLANT
TRAY FOLEY MTR SLVR 16FR STAT (SET/KITS/TRAYS/PACK) ×1 IMPLANT
WATER STERILE IRR 1000ML POUR (IV SOLUTION) ×1 IMPLANT

## 2023-01-19 NOTE — H&P (Signed)
Subjective: Patient is a 72 y.o. female admitted for back and leg pain. Onset of symptoms was a few years ago, gradually worsening since that time.  The pain is rated severe, and is located at the across the lower back and radiates to legs. The pain is described as aching and occurs all day. The symptoms have been progressive. Symptoms are exacerbated by exercise, standing, and walking for more than a few minutes. 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   a.) preoperative PCR (+) on 02/14/2021 prior to LUMBAR MICRODISCECTOMY; b.) preoperative PCR (+) 06/24/2022 prior to REVERSE SHOULDER ARTHROPLASTY W/ BICEPS TENODESIS   Obesity    OSA on CPAP    Plantar fasciitis    right   Pneumonia    Renal calculi    Rheumatoid arthritis (HCC)    RLS (restless legs syndrome) 08/23/2014   T2DM (type 2 diabetes mellitus) (HCC)     Past Surgical History:  Procedure Laterality Date   ABDOMINAL HYSTERECTOMY     partial   APPENDECTOMY     Arthroscopic surgery, knee Left    BREAST BIOPSY Right    several   BREAST BIOPSY Right 03/11/2017   u/s bx neg   BUNIONECTOMY     LEFT    COLONOSCOPY W/ POLYPECTOMY     COLONOSCOPY WITH PROPOFOL N/A 08/14/2017   Procedure: COLONOSCOPY WITH PROPOFOL;  Surgeon: Christena Deem, MD;  Location: [facility] ENDOSCOPY;  Service: Endoscopy;  Laterality: N/A;   EXTRACORPOREAL SHOCK WAVE LITHOTRIPSY Left 05/14/2017   Procedure: EXTRACORPOREAL SHOCK WAVE LITHOTRIPSY (ESWL);   Surgeon: Riki Altes, MD;  Location: [facility] ORS;  Service: Urology;  Laterality: Left;   EXTRACORPOREAL SHOCK WAVE LITHOTRIPSY Left 06/14/2020   Procedure: EXTRACORPOREAL SHOCK WAVE LITHOTRIPSY (ESWL);  Surgeon: Sondra Come, MD;  Location: [facility] ORS;  Service: Urology;  Laterality: Left;   FINGER SURGERY Right    FOOT SURGERY     RIGHT     KNEE ARTHROSCOPY W/ MENISCAL REPAIR Left    LASIK     LUMBAR LAMINECTOMY/DECOMPRESSION MICRODISCECTOMY Right 03/01/2021   Procedure: Microdiscectomy - Lumbar five-Sacral one - right;  Surgeon: Tia Alert, MD;  Location: [facility] OR;  Service: Neurosurgery;  Laterality: Right;   MAXIMUM ACCESS (MAS)POSTERIOR LUMBAR INTERBODY FUSION (PLIF) 1 LEVEL N/A 04/25/2016   Procedure: LUMBAR FOUR-FIVE  MAXIMUM ACCESS (MAS) POSTERIOR LUMBAR INTERBODY FUSION (PLIF) with extension of instrumentation LUMBAR TWO-FIVE;  Surgeon: Tia Alert, MD;  Location: [facility] OR;  Service: Neurosurgery;  Laterality: N/A;   MAXIMUM ACCESS (MAS)POSTERIOR LUMBAR INTERBODY FUSION (PLIF) 2 LEVEL N/A 12/13/2015   Procedure: Lumbar two-three - Lumbar three-four MAXIMUM ACCESS (MAS) POSTERIOR LUMBAR INTERBODY FUSION (PLIF)  ;  Surgeon: Tia Alert, MD;  Location: [facility]URO ORS;  Service: Neurosurgery;  Laterality: N/A;   REVERSE SHOULDER ARTHROPLASTY Left 03/01/2020   Procedure: REVERSE SHOULDER  ARTHROPLASTY;  Surgeon: Christena Flake, MD;  Location: [facility] ORS;  Service: Orthopedics;  Laterality: Left;   REVERSE SHOULDER ARTHROPLASTY Right 07/03/2022   Procedure: REVERSE SHOULDER ARTHROPLASTY W/ BICEPS TENODESIS.;  Surgeon: Christena Flake, MD;  Location: [facility] ORS;  Service: Orthopedics;  Laterality: Right;   SHOULDER SURGERY     RIGHT    TONSILLECTOMY      Prior to Admission medications   Medication Sig Start Date End Date Taking? Authorizing Provider  alendronate (FOSAMAX) 70 MG tablet Take 70 mg by mouth every 7 (seven) days. Take with a full glass of water on an empty stomach.   Yes [provider]  aspirin 81 MG EC tablet Take 81 mg by mouth at bedtime.   Yes [provider]  DULoxetine (CYMBALTA) 60 MG capsule Take 60 mg by mouth daily.   Yes [provider]  HYDROcodone-acetaminophen (NORCO/VICODIN) 5-325 MG tablet Take 0.5-1 tablets by mouth every 8 (eight) hours as needed for moderate pain or severe pain. 05/08/22  Yes [provider]  hydroxychloroquine (PLAQUENIL) 200 MG tablet Take 200 mg by mouth 2 (two) times daily.   Yes [provider]  levothyroxine (SYNTHROID, LEVOTHROID) 112 MCG tablet Take 112 mcg by mouth daily before breakfast.  11/07/15  Yes [provider]  meloxicam (MOBIC) 7.5 MG tablet Take 7.5 mg by mouth daily.   Yes [provider]  mirabegron ER (MYRBETRIQ) 25 MG TB24 tablet Take 1 tablet by mouth daily. 12/16/22  Yes [provider]  NON FORMULARY Pt uses a cpap nightly   Yes [provider]  pravastatin (PRAVACHOL) 20 MG tablet Take 20 mg by mouth at bedtime.   Yes [provider]  rOPINIRole (REQUIP) 4 MG tablet Take 4 mg by mouth 2 (two) times daily. 01/14/22  Yes [provider]  telmisartan (MICARDIS) 80 MG tablet Take 80 mg by mouth daily.   Yes [provider]  Apoaequorin (PREVAGEN PO) Take 1 capsule by mouth daily.    [provider]  empagliflozin (JARDIANCE) 10 MG TABS tablet Take 5 mg by mouth 2 (two) times daily as needed (high blood sugar).    [provider]  gabapentin (NEURONTIN) 800 MG tablet Take 1 tablet (800 mg total) by mouth 2 (two) times daily. Takes 400 mg in daytime and 800 mg at bedtime Patient not taking: Reported on 01/01/2023 07/03/22   Poggi, Excell Seltzer, MD  metoprolol tartrate (LOPRESSOR) 100 MG tablet Take tablet (100mg ) TWO hours prior to your cardiac CT scan. Patient not taking: Reported on 01/01/2023 10/24/22   Debbe Odea, MD   Allergies  Allergen Reactions   Diazepam Other (See Comments)     hallucinations   Dexamethasone Other (See Comments)    During a tapered dose, once.  Was shaky (side effect, not allergy).    Social History   Tobacco Use   Smoking status: Never   Smokeless tobacco: Never  Substance Use Topics   Alcohol use: No    Family History  Problem Relation Age of Onset   Lung cancer Mother    Congestive Heart Failure Father    Depression Sister    Rheum arthritis Sister    Headache Maternal Grandfather    Migraines Maternal Grandfather    Headache Maternal Uncle    Diabetes Other    Heart disease Other    Hypertension Other    Breast cancer Neg Hx    Bladder Cancer Neg Hx    Kidney  cancer Neg Hx      Review of Systems  Positive ROS: neg  All other systems have been reviewed and were otherwise negative with the exception of those mentioned in the HPI and as above.  Objective: Vital signs in last 24 hours: Temp:  [98.1 F (36.7 C)] 98.1 F (36.7 C) (09/16 0601) Pulse Rate:  [73] 73 (09/16 0601) Resp:  [18] 18 (09/16 0601) BP: (137)/(77) 137/77 (09/16 0601) SpO2:  [95 %] 95 % (09/16 0601) Weight:  [77.6 kg] 77.6 kg (09/16 0601)  General Appearance: Alert, cooperative, no distress, appears stated age Head: Normocephalic, without obvious abnormality, atraumatic Eyes: PERRL, conjunctiva/corneas clear, EOM's intact    Neck: Supple, symmetrical, trachea midline Back: Symmetric, no curvature, ROM normal, no CVA tenderness Lungs:  respirations unlabored Heart: Regular rate and rhythm Abdomen: Soft, non-tender Extremities: Extremities normal, atraumatic, no cyanosis or edema Pulses: 2+ and symmetric all extremities Skin: Skin color, texture, turgor normal, no rashes or lesions  NEUROLOGIC:   Mental status: Alert and oriented x4,  no aphasia, good attention span, fund of knowledge, and memory Motor Exam - grossly normal Sensory Exam - grossly normal Reflexes: trace Coordination - grossly normal Gait - grossly normal Balance - grossly  normal Cranial Nerves: I: smell Not tested  II: visual acuity  OS: nl    OD: nl  II: visual fields Full to confrontation  II: pupils Equal, round, reactive to light  III,VII: ptosis None  III,IV,VI: extraocular muscles  Full ROM  V: mastication Normal  V: facial light touch sensation  Normal  V,VII: corneal reflex  Present  VII: facial muscle function - upper  Normal  VII: facial muscle function - lower Normal  VIII: hearing Not tested  IX: soft palate elevation  Normal  IX,X: gag reflex Present  XI: trapezius strength  5/5  XI: sternocleidomastoid strength 5/5  XI: neck flexion strength  5/5  XII: tongue strength  Normal    Data Review Lab Results  Component Value Date   WBC 5.3 01/07/2023   HGB 14.0 01/07/2023   HCT 42.3 01/07/2023   MCV 93.6 01/07/2023   PLT 147 (L) 01/07/2023   Lab Results  Component Value Date   NA 140 01/07/2023   K 4.0 01/07/2023   CL 104 01/07/2023   CO2 25 01/07/2023   BUN 20 01/07/2023   CREATININE 0.65 01/07/2023   GLUCOSE 107 (H) 01/07/2023   Lab Results  Component Value Date   INR 0.9 01/07/2023    Assessment/Plan:  Estimated body mass index is 32.31 kg/m as calculated from the following:   Height as of this encounter: 5\' 1"  (1.549 m).   Weight as of this encounter: 77.6 kg. Patient admitted for PLIF L5-S1. Patient has failed a reasonable attempt at conservative therapy.  I explained the condition and procedure to the patient and answered any questions.  Patient wishes to proceed with procedure as planned. Understands risks/ benefits and typical outcomes of procedure.   Tia Alert 01/19/2023 7:29 AM

## 2023-01-19 NOTE — Transfer of Care (Signed)
Immediate Anesthesia Transfer of Care Note  Patient: Jessica Cole  Procedure(s) Performed: Posterior Lumbar Interbody Fusion - Lumbar five -Sacral one - Posterior Lateral and Interbody fusion (Back)  Patient Location: PACU  Anesthesia Type:General  Level of Consciousness: awake, alert , and oriented  Airway & Oxygen Therapy: Patient Spontanous Breathing and Patient connected to face mask oxygen  Post-op Assessment: Report given to RN and Post -op Vital signs reviewed and stable  Post vital signs: Reviewed and stable  Last Vitals:  Vitals Value Taken Time  BP 132/70 01/19/23 1130  Temp    Pulse 73 01/19/23 1130  Resp 11 01/19/23 1130  SpO2 100 % 01/19/23 1130  Vitals shown include unfiled device data.  Last Pain:  Vitals:   01/19/23 0617  TempSrc:   PainSc: 4       Patients Stated Pain Goal: 2 (01/19/23 0617)  Complications: No notable events documented.

## 2023-01-19 NOTE — Op Note (Signed)
01/19/2023  11:17 AM  PATIENT:  Jessica Cole  72 y.o. female  PRE-OPERATIVE DIAGNOSIS: Spondylolisthesis L5-S1, adjacent level disease L5-S1, spinal stenosis L4-S1, back pain with bilateral leg pain  POST-OPERATIVE DIAGNOSIS:  same  PROCEDURE:   1. Decompressive lumbar laminectomy, hemi facetectomy and foraminotomies L5-S1 requiring more work than would be required for a simple exposure of the disk for PLIF in order to adequately decompress the neural elements and address the spinal stenosis 2. Posterior lumbar interbody fusion L5-S1 using globus expandable interbody cages packed with morcellized allograft and autograft  3. Posterior fixation L4-S1 using NuVasive cortical pedicle screws.  4. Intertransverse arthrodesis L5-S1 using morcellized autograft and allograft. 5.  Movable of segmental fixation L2-L5 with exploration of fusion to assure solid arthrodesis  SURGEON:  Marikay Alar, MD  ASSISTANTS: Verlin Dike, FNP  ANESTHESIA:  General  EBL: 350 ml  Total I/O In: -  Out: 670 [Urine:320; Blood:350]  BLOOD ADMINISTERED:none  DRAINS: none   INDICATION FOR PROCEDURE: This patient presented with back pain and bilateral leg pain. Imaging revealed recent level spondylolisthesis with stenosis L5-S1 with previous instrumented fusion L2-L5. The patient tried a reasonable attempt at conservative medical measures without relief. I recommended decompression and instrumented fusion to address the stenosis as well as the segmental  instability.  Patient understood the risks, benefits, and alternatives and potential outcomes and wished to proceed.  PROCEDURE DETAILS:  The patient was brought to the operating room. After induction of generalized endotracheal anesthesia the patient was rolled into the prone position on chest rolls and all pressure points were padded. The patient's lumbar region was cleaned and then prepped with DuraPrep and draped in the usual sterile fashion. Anesthesia was  injected and then a dorsal midline incision was made and carried down to the lumbosacral fascia. The fascia was opened and the paraspinous musculature was taken down in a subperiosteal fashion to expose the previously placed instrumentation as well as L5-S1. A self-retaining retractor was placed. Intraoperative fluoroscopy confirmed my level, and I started with removal of the old instrumentation.  The locking caps were removed from L2-L5 and the rods were removed.  All screws had good purchase and the arthrodesis appeared to be appeared to be solid   I then turned my attention to the decompression and complete lumbar laminectomies, hemi- facetectomies, and foraminotomies were performed at L5-S1 bilaterally.  Previous surgery on the right and we spent considerable time dissecting the epidural fibrosis and dura away from the underside of the remaining lamina and medial facet.  My nurse practitioner was directly involved in the decompression and exposure of the neural elements. the patient had significant spinal stenosis and this required more work than would be required for a simple exposure of the disc for posterior lumbar interbody fusion which would only require a limited laminotomy. Much more generous decompression and generous foraminotomy was undertaken in order to adequately decompress the neural elements and address the patient's leg pain.  We had to remove the L5 pedicle screws to get the tulip heads out of the way so that we could adequately decompress the level.  The yellow ligament was removed to expose the underlying dura and nerve roots, and generous foraminotomies were performed to adequately decompress the neural elements. Both the exiting and traversing nerve roots were decompressed on both sides until a coronary dilator passed easily along the nerve roots. Once the decompression was complete, I turned my attention to the posterior lower lumbar interbody fusion. The epidural venous vasculature  was  coagulated and cut sharply. Disc space was incised and the initial discectomy was performed with pituitary rongeurs. The disc space was distracted with sequential distractors to a height of 10 mm. We then used a series of scrapers and shavers to prepare the endplates for fusion. The midline was prepared with Epstein curettes. Once the complete discectomy was finished, we packed an appropriate sized interbody cage with local autograft and morcellized allograft, gently retracted the nerve root, and tapped the cage into position at L5-S1.  We expanded the cages until they had a nice tight fit and were 2 fingers tight.  The midline between the cages was packed with morselized autograft and allograft.   We then turned our attention to the placement of the lower pedicle screws.  The L5 pedicles were replaced with a 5.5 x 40 mm pedicle screw on the left and a 6.5 x 40 mm screw on the right.  The pedicle screw entry zones of S1 were identified utilizing surface landmarks and fluoroscopy. I drilled into each pedicle utilizing the hand drill, and tapped each pedicle with the appropriate tap. We palpated with a ball probe to assure no break in the cortex. We then placed 6.5 x 40 mm pedicle screws into the pedicles bilaterally at S1.  My nurse practitioner assisted in placement of the pedicle screws.  We then decorticated the transverse processes and laid a mixture of morcellized autograft and allograft out over these to perform intertransverse arthrodesis at L5-S1. We then placed lordotic rods into the multiaxial screw heads of the pedicle screws of L4, L5 and S1 bilaterally and locked these in position with the locking caps and anti-torque device.  We left the L2 and L3 screws in place in case she developed adjacent level disease above this.  We then checked our construct with AP and lateral fluoroscopy. Irrigated with copious amounts of 0.5% povidone iodine solution followed by a liter of saline solution. Inspected the  nerve roots once again to assure adequate decompression, lined to the dura with Gelfoam,  and then we closed the muscle and the fascia with 0 Vicryl. Closed the subcutaneous tissues with 2-0 Vicryl and subcuticular tissues with 3-0 Vicryl. The skin was closed with benzoin and Steri-Strips. Dressing was then applied, the patient was awakened from general anesthesia and transported to the recovery room in stable condition. At the end of the procedure all sponge, needle and instrument counts were correct.   PLAN OF CARE: admit to inpatient  PATIENT DISPOSITION:  PACU - hemodynamically stable.   Delay start of Pharmacological VTE agent (>24hrs) due to surgical blood loss or risk of bleeding:  yes

## 2023-01-19 NOTE — Anesthesia Postprocedure Evaluation (Signed)
Anesthesia Post Note  Patient: [name]  Procedure(s) Performed: Posterior Lumbar Interbody Fusion - Lumbar five -Sacral one - Posterior Lateral and Interbody fusion (Back)     Patient location during evaluation: PACU Anesthesia Type: General Level of consciousness: awake and alert Pain management: pain level controlled Vital Signs Assessment: post-procedure vital signs reviewed and stable Respiratory status: spontaneous breathing, nonlabored ventilation, respiratory function stable and patient connected to nasal cannula oxygen Cardiovascular status: blood pressure returned to baseline and stable Postop Assessment: no apparent nausea or vomiting Anesthetic complications: no  No notable events documented.  Last Vitals:  Vitals:   01/19/23 1200 01/19/23 1230  BP: (!) 104/53 (!) 102/58  Pulse: 75 74  Resp: 11 12  Temp: 36.6 C   SpO2: 100% 93%    Last Pain:  Vitals:   01/19/23 1128  TempSrc:   PainSc: 4                  Adaya Garmany L Michaeleen Down

## 2023-01-19 NOTE — Anesthesia Procedure Notes (Signed)
Procedure Name: Intubation Date/Time: 01/19/2023 8:17 AM  Performed by: Farron Lafond L, MDPre-anesthesia Checklist: Patient identified, Emergency Drugs available, Suction available and Patient being monitored Patient Re-evaluated:Patient Re-evaluated prior to induction Oxygen Delivery Method: Circle System Utilized Preoxygenation: Pre-oxygenation with 100% oxygen Induction Type: IV induction Ventilation: Mask ventilation without difficulty Laryngoscope Size: Mac and 3 Grade View: Grade I Tube type: Oral Tube size: 7.0 mm Number of attempts: 1 Airway Equipment and Method: Stylet and Oral airway Placement Confirmation: ETT inserted through vocal cords under direct vision, positive ETCO2 and breath sounds checked- equal and bilateral Secured at: 22 cm Tube secured with: Tape Dental Injury: Teeth and Oropharynx as per pre-operative assessment

## 2023-01-19 NOTE — Plan of Care (Signed)
°  Problem: Education: °Goal: Ability to verbalize activity precautions or restrictions will improve °Outcome: Completed/Met °Goal: Knowledge of the prescribed therapeutic regimen will improve °Outcome: Completed/Met °Goal: Understanding of discharge needs will improve °Outcome: Completed/Met °  °Problem: Activity: °Goal: Ability to avoid complications of mobility impairment will improve °Outcome: Completed/Met °Goal: Ability to tolerate increased activity will improve °Outcome: Completed/Met °Goal: Will remain free from falls °Outcome: Completed/Met °  °Problem: Bowel/Gastric: °Goal: Gastrointestinal status for postoperative course will improve °Outcome: Completed/Met °  °Problem: Clinical Measurements: °Goal: Ability to maintain clinical measurements within normal limits will improve °Outcome: Completed/Met °Goal: Postoperative complications will be avoided or minimized °Outcome: Completed/Met °Goal: Diagnostic test results will improve °Outcome: Completed/Met °  °Problem: Pain Management: °Goal: Pain level will decrease °Outcome: Completed/Met °  °Problem: Skin Integrity: °Goal: Will show signs of wound healing °Outcome: Completed/Met °  °Problem: Health Behavior/Discharge Planning: °Goal: Identification of resources available to assist in meeting health care needs will improve °Outcome: Completed/Met °  °Problem: Bladder/Genitourinary: °Goal: Urinary functional status for postoperative course will improve °Outcome: Completed/Met °  °

## 2023-01-20 DIAGNOSIS — M4317 Spondylolisthesis, lumbosacral region: Secondary | ICD-10-CM | POA: Diagnosis not present

## 2023-01-20 MED ORDER — HYDROCODONE-ACETAMINOPHEN 5-325 MG PO TABS: 1.0000 | ORAL_TABLET | ORAL | 0 refills | Status: AC | PRN

## 2023-01-20 MED ORDER — TIZANIDINE HCL 2 MG PO TABS: 2.0000 mg | ORAL_TABLET | Freq: Four times a day (QID) | ORAL | 1 refills | Status: AC | PRN

## 2023-01-20 NOTE — Progress Notes (Signed)
Pt. discharged home accompanied by husband. Prescriptions and discharge instructions given with verbalization of understanding. Incision site on back with no s/s of infection - no swelling, redness, bleeding, and/or drainage noted. Pt. transported out of this unit in wheelchair by volunteer

## 2023-01-20 NOTE — Discharge Summary (Signed)
Physician Discharge Summary  Patient ID: Jessica Cole MRN: [id number] DOB/AGE: 07/20/1950 72 y.o.  Admit date: 01/19/2023 Discharge date: 01/20/2023  Admission Diagnoses: spondylolisthesis   Discharge Diagnoses: same   Discharged Condition: good  Hospital Course: The patient was admitted on 01/19/2023 and taken to the operating room where the patient underwent plif L5-S1. The patient tolerated the procedure well and was taken to the recovery room and then to the floor in stable condition. The hospital course was routine. There were no complications. The wound remained clean dry and intact. Pt had appropriate back soreness. No complaints of leg pain or new N/T/W. The patient remained afebrile with stable vital signs, and tolerated a regular diet. The patient continued to increase activities, and pain was well controlled with oral pain medications.   Consults: None  Significant Diagnostic Studies:  Results for orders placed or performed during the hospital encounter of 01/19/23  Glucose, capillary  Result Value Ref Range   Glucose-Capillary 96 70 - 99 mg/dL  Glucose, capillary  Result Value Ref Range   Glucose-Capillary 114 (H) 70 - 99 mg/dL  Glucose, capillary  Result Value Ref Range   Glucose-Capillary 158 (H) 70 - 99 mg/dL    DG Lumbar Spine 2-3 Views  Result Date: 01/19/2023 CLINICAL DATA:  Elective surgery. EXAM: LUMBAR SPINE - 2-3 VIEW COMPARISON:  Preoperative radiograph 10/30/2022 FINDINGS: Four fluoroscopic spot views of the lumbar spine obtained in the operating room. Sequential images obtained during lumbar surgery, hardware now extends to the S1 level. Fluoroscopy time 49 seconds. Dose 63.82 mGy. IMPRESSION: Intraoperative fluoroscopy during lumbar surgery. Electronically Signed   By: Narda Rutherford M.D.   On: 01/19/2023 12:46   DG C-Arm 1-60 Min-No Report  Result Date: 01/19/2023 Fluoroscopy was utilized by the requesting physician.  No radiographic interpretation.    DG C-Arm 1-60 Min-No Report  Result Date: 01/19/2023 Fluoroscopy was utilized by the requesting physician.  No radiographic interpretation.   DG C-Arm 1-60 Min-No Report  Result Date: 01/19/2023 Fluoroscopy was utilized by the requesting physician.  No radiographic interpretation.    Antibiotics:  Anti-infectives (From admission, onward)    Start     Dose/Rate Route Frequency Ordered Stop   01/19/23 2200  hydroxychloroquine (PLAQUENIL) tablet 200 mg  Status:  Discontinued        200 mg Oral 2 times daily 01/19/23 1247 01/20/23 1801   01/19/23 1600  ceFAZolin (ANCEF) IVPB 2g/100 mL premix        2 g 200 mL/hr over 30 Minutes Intravenous Every 8 hours 01/19/23 1234 01/20/23 0016   01/19/23 0600  vancomycin (VANCOCIN) IVPB 1000 mg/200 mL premix        1,000 mg 200 mL/hr over 60 Minutes Intravenous  Once 01/19/23 0554 01/19/23 0733   01/19/23 0600  ceFAZolin (ANCEF) IVPB 2g/100 mL premix        2 g 200 mL/hr over 30 Minutes Intravenous On call to O.R. 01/19/23 0554 01/19/23 0800       Discharge Exam: Blood pressure 112/62, pulse 64, temperature 97.9 F (36.6 C), temperature source Oral, resp. rate 16, height 5\' 1"  (1.549 m), weight 77.6 kg, SpO2 99%. Neurologic: Grossly normal Dressing dry  Discharge Medications:   Allergies as of 01/20/2023       Reactions   Diazepam Other (See Comments)   hallucinations   Dexamethasone Other (See Comments)   During a tapered dose, once.  Was shaky (side effect, not allergy).        Medication  List     TAKE these medications    alendronate 70 MG tablet Commonly known as: FOSAMAX Take 70 mg by mouth every 7 (seven) days. Take with a full glass of water on an empty stomach.   aspirin EC 81 MG tablet Take 81 mg by mouth at bedtime.   DULoxetine 60 MG capsule Commonly known as: CYMBALTA Take 60 mg by mouth daily.   empagliflozin 10 MG Tabs tablet Commonly known as: JARDIANCE Take 5 mg by mouth 2 (two) times daily as needed  (high blood sugar).   gabapentin 800 MG tablet Commonly known as: NEURONTIN Take 1 tablet (800 mg total) by mouth 2 (two) times daily. Takes 400 mg in daytime and 800 mg at bedtime   HYDROcodone-acetaminophen 5-325 MG tablet Commonly known as: NORCO/VICODIN Take 1 tablet by mouth every 4 (four) hours as needed for moderate pain or severe pain. What changed:  how much to take when to take this   hydroxychloroquine 200 MG tablet Commonly known as: PLAQUENIL Take 200 mg by mouth 2 (two) times daily.   levothyroxine 112 MCG tablet Commonly known as: SYNTHROID Take 112 mcg by mouth daily before breakfast.   meloxicam 7.5 MG tablet Commonly known as: MOBIC Take 7.5 mg by mouth daily.   metoprolol tartrate 100 MG tablet Commonly known as: LOPRESSOR Take tablet (100mg ) TWO hours prior to your cardiac CT scan.   mirabegron ER 25 MG Tb24 tablet Commonly known as: MYRBETRIQ Take 1 tablet by mouth daily.   NON FORMULARY Pt uses a cpap nightly   pravastatin 20 MG tablet Commonly known as: PRAVACHOL Take 20 mg by mouth at bedtime.   PREVAGEN PO Take 1 capsule by mouth daily.   rOPINIRole 4 MG tablet Commonly known as: REQUIP Take 4 mg by mouth 2 (two) times daily.   telmisartan 80 MG tablet Commonly known as: MICARDIS Take 80 mg by mouth daily.   tiZANidine 2 MG tablet Commonly known as: ZANAFLEX Take 1 tablet (2 mg total) by mouth every 6 (six) hours as needed for muscle spasms.        Disposition: home  Final Dx: PLIF L5-S1  Discharge Instructions      Remove dressing in 72 hours   Complete by: As directed    Call MD for:  difficulty breathing, headache or visual disturbances   Complete by: As directed    Call MD for:  persistant nausea and vomiting   Complete by: As directed    Call MD for:  redness, tenderness, or signs of infection (pain, swelling, redness, odor or green/yellow discharge around incision site)   Complete by: As directed    Call MD for:   severe uncontrolled pain   Complete by: As directed    Call MD for:  temperature >100.4   Complete by: As directed    Diet - low sodium heart healthy   Complete by: As directed    Increase activity slowly   Complete by: As directed         Follow-up Information     Arman Bogus, MD. Call.   Specialty: Neurosurgery Why: As needed, If symptoms worsen Contact information: [address] [place] [address] [phone]                  Signed: Tia Alert 01/20/2023, 10:14 PM

## 2023-01-20 NOTE — Evaluation (Signed)
Physical Therapy Evaluation  Patient Details Name: Jessica Cole MRN: [id number] DOB: 1950/07/21 Today's Date: 01/20/2023  History of Present Illness  Pt is a 72 y/o F who presents s/p L5-S1 PLIF on 01/19/2023.  PMH significant for depression, anxiety, B TSA, OSA and HTN.  Clinical Impression  Pt admitted with above diagnosis. At the time of PT eval, pt was able to demonstrate transfers and ambulation with gross supervision for safety to modified independence and no AD. Pt was educated on precautions, brace application/wearing schedule, appropriate activity progression, and car transfer. Pt currently with functional limitations due to the deficits listed below (see PT Problem List). Pt will benefit from skilled PT to increase their independence and safety with mobility to allow discharge to the venue listed below.          If plan is discharge home, recommend the following: A little help with walking and/or transfers;A little help with bathing/dressing/bathroom;Assistance with cooking/housework;Help with stairs or ramp for entrance;Assist for transportation   Can travel by private vehicle        Equipment Recommendations None recommended by PT  Recommendations for Other Services       Functional Status Assessment Patient has had a recent decline in their functional status and demonstrates the ability to make significant improvements in function in a reasonable and predictable amount of time.     Precautions / Restrictions Precautions Precautions: Back;Fall Precaution Booklet Issued: Yes (comment) Precaution Comments: Reviewed handout and pt was cued for precautions during functional mobility. Required Braces or Orthoses: Spinal Brace Spinal Brace: Lumbar corset;Applied in sitting position Restrictions Weight Bearing Restrictions: No      Mobility  Bed Mobility Overal bed mobility: Modified Independent             General bed mobility comments: HOB slightly elevated and  rails lowered to simulate home environment.    Transfers Overall transfer level: Modified independent Equipment used: None               General transfer comment: VC's for hand placement on seated surface for safety. Pt with good maintenance of posture and precautions throughout transfers.    Ambulation/Gait Ambulation/Gait assistance: Supervision Gait Distance (Feet): 500 Feet Assistive device: None Gait Pattern/deviations: Step-through pattern, Decreased stride length, Trunk flexed, Narrow base of support, Drifts right/left Gait velocity: Decreased Gait velocity interpretation: 1.31 - 2.62 ft/sec, indicative of limited community ambulator   General Gait Details: Pt very guarded initially however progressed quickly, appearing more comfortable and requiring supervision for safety by end of gait training. Pt drifting R and L while talking but no overt LOB noted.  Stairs            Wheelchair Mobility     Tilt Bed    Modified Rankin (Stroke Patients Only)       Balance Overall balance assessment: Mild deficits observed, not formally tested                                           Pertinent Vitals/Pain Pain Assessment Pain Assessment: Faces Faces Pain Scale: Hurts a little bit Pain Location: Incisional Pain Descriptors / Indicators: Aching Pain Intervention(s): Limited activity within patient's tolerance, Monitored during session, Repositioned    Home Living Family/patient expects to be discharged to:: Private residence Living Arrangements: Spouse/significant other Available Help at Discharge: Family;Available 24 hours/day Type of Home: House Home Access: Stairs  to enter Entrance Stairs-Rails: None Entrance Stairs-Number of Steps: 1   Home Layout: One level Home Equipment: Agricultural consultant (2 wheels);Cane - quad;Cane - single point;Hand held shower head Additional Comments: Adjustable bed, spouse looking for shower chair.    Prior  Function Prior Level of Function : Independent/Modified Independent               ADLs Comments: Pt reports Ind at baseline     Extremity/Trunk Assessment   Upper Extremity Assessment Upper Extremity Assessment: Defer to OT evaluation    Lower Extremity Assessment Lower Extremity Assessment: Generalized weakness (Mild; consistent with pre-op diagnosis.)    Cervical / Trunk Assessment Cervical / Trunk Assessment: Back Surgery  Communication   Communication Communication: No apparent difficulties Cueing Techniques: Verbal cues;Gestural cues  Cognition Arousal: Alert Behavior During Therapy: WFL for tasks assessed/performed Overall Cognitive Status: Within Functional Limits for tasks assessed                                          General Comments      Exercises     Assessment/Plan    PT Assessment Patient needs continued PT services  PT Problem List Decreased strength;Decreased balance;Decreased activity tolerance;Decreased mobility;Decreased knowledge of use of DME;Decreased safety awareness;Decreased knowledge of precautions;Pain       PT Treatment Interventions DME instruction;Gait training;Functional mobility training;Stair training;Therapeutic activities;Therapeutic exercise;Balance training;Patient/family education    PT Goals (Current goals can be found in the Care Plan section)  Acute Rehab PT Goals Patient Stated Goal: Home today PT Goal Formulation: With patient Time For Goal Achievement: 01/27/23 Potential to Achieve Goals: Good    Frequency Min 5X/week     Co-evaluation               AM-PAC PT "6 Clicks" Mobility  Outcome Measure Help needed turning from your back to your side while in a flat bed without using bedrails?: A Little Help needed moving from lying on your back to sitting on the side of a flat bed without using bedrails?: A Little Help needed moving to and from a bed to a chair (including a wheelchair)?: A  Little Help needed standing up from a chair using your arms (e.g., wheelchair or bedside chair)?: A Little Help needed to walk in hospital room?: A Little Help needed climbing 3-5 steps with a railing? : A Little 6 Click Score: 18    End of Session Equipment Utilized During Treatment: Gait belt;Back brace Activity Tolerance: Patient tolerated treatment well Patient left: in bed;with call bell/phone within reach Nurse Communication: Mobility status PT Visit Diagnosis: Unsteadiness on feet (R26.81);Pain Pain - part of body:  (back)    Time: 4098-1191 PT Time Calculation (min) (ACUTE ONLY): 15 min   Charges:   PT Evaluation $PT Eval Low Complexity: 1 Low   PT General Charges $$ ACUTE PT VISIT: 1 Visit         Conni Slipper, PT, DPT Acute Rehabilitation Services Secure Chat Preferred Office: [phone]   Marylynn Pearson 01/20/2023, 9:53 AM

## 2023-01-20 NOTE — Care Management CC44 (Signed)
Condition Code 44 Documentation Completed  Patient Details  Name: Jessica Cole MRN: [id number] Date of Birth: [birth year]   Condition Code 44 given:  Yes Patient signature on Condition Code 44 notice:  Yes Documentation of 2 MD's agreement:  Yes Code 44 added to claim:  Yes    Kermit Balo, RN 01/20/2023, 10:28 AM

## 2023-01-20 NOTE — Care Management Obs Status (Signed)
MEDICARE OBSERVATION STATUS NOTIFICATION   Patient Details  Name: NYEMAH ERRIGO MRN: [id number] Date of Birth: 1950-07-09   Medicare Observation Status Notification Given:  Yes    Kermit Balo, RN 01/20/2023, 10:27 AM

## 2023-01-20 NOTE — Evaluation (Signed)
Occupational Therapy Evaluation Patient Details Name: Jessica Cole MRN: [id number] DOB: 08/27/1950 Today's Date: 01/20/2023   History of Present Illness 72 yo F s/p PLIF.  PMH includes: Arthritis, GERD, depression, anxiety, B TSA, OSA and HTN.   Clinical Impression   Patient admitted for the procedure above.  PTA she lives at home with her spouse, who can assist as needed.  Patient is very close to her baseline, and has a good understanding of all precautions.  Patient able to complete ADL and in room mobility without assist.  No further OT needs exist in the acute setting.  Recommend follow up with MD as prescribed.        If plan is discharge home, recommend the following: Assist for transportation;Assistance with cooking/housework    Functional Status Assessment  Patient has not had a recent decline in their functional status  Equipment Recommendations  None recommended by OT    Recommendations for Other Services       Precautions / Restrictions Precautions Precautions: Back Precaution Booklet Issued: Yes (comment) Precaution Comments: Issued by PT, reviewed for questions Restrictions Weight Bearing Restrictions: No      Mobility Bed Mobility Overal bed mobility: Modified Independent                  Transfers Overall transfer level: Modified independent                        Balance Overall balance assessment: Mild deficits observed, not formally tested                                         ADL either performed or assessed with clinical judgement   ADL Overall ADL's : At baseline                                             Vision Patient Visual Report: No change from baseline       Perception Perception: Not tested       Praxis Praxis: Not tested       Pertinent Vitals/Pain Pain Assessment Pain Assessment: Faces Faces Pain Scale: Hurts a little bit Pain Location: Incisional Pain  Descriptors / Indicators: Aching Pain Intervention(s): Monitored during session     Extremity/Trunk Assessment Upper Extremity Assessment Upper Extremity Assessment: Overall WFL for tasks assessed   Lower Extremity Assessment Lower Extremity Assessment: Defer to PT evaluation   Cervical / Trunk Assessment Cervical / Trunk Assessment: Back Surgery   Communication     Cognition Arousal: Alert Behavior During Therapy: WFL for tasks assessed/performed Overall Cognitive Status: Within Functional Limits for tasks assessed                                       General Comments   VSS on RA    Exercises     Shoulder Instructions      Home Living Family/patient expects to be discharged to:: Private residence Living Arrangements: Spouse/significant other Available Help at Discharge: Family;Available 24 hours/day Type of Home: House Home Access: Stairs to enter [employer] of Steps: 1 Entrance Stairs-Rails: None Home Layout: One level     Bathroom Shower/Tub:  Tub/shower unit   Bathroom Toilet: Standard Bathroom Accessibility: Yes How Accessible: Accessible via walker Home Equipment: Rolling Walker (2 wheels);Cane - quad;Cane - single point;Hand held shower head   Additional Comments: Adjustable bed, spouse looking for shower chair.      Prior Functioning/Environment Prior Level of Function : Independent/Modified Independent               ADLs Comments: Pt reports Ind at baseline        OT Problem List: Pain      OT Treatment/Interventions:      OT Goals(Current goals can be found in the care plan section) Acute Rehab OT Goals Patient Stated Goal: Return home OT Goal Formulation: With patient Time For Goal Achievement: 01/23/23 Potential to Achieve Goals: Good  OT Frequency:      Co-evaluation              AM-PAC OT "6 Clicks" Daily Activity     Outcome Measure Help from another person eating meals?: None Help from  another person taking care of personal grooming?: None Help from another person toileting, which includes using toliet, bedpan, or urinal?: None Help from another person bathing (including washing, rinsing, drying)?: None Help from another person to put on and taking off regular upper body clothing?: A Little Help from another person to put on and taking off regular lower body clothing?: None 6 Click Score: 23   End of Session Nurse Communication: Mobility status  Activity Tolerance: Patient tolerated treatment well Patient left: in chair;with call bell/phone within reach  OT Visit Diagnosis: Unsteadiness on feet (R26.81)                Time: 7829-5621 OT Time Calculation (min): 15 min Charges:  OT General Charges $OT Visit: 1 Visit OT Evaluation $OT Eval Moderate Complexity: 1 Mod  01/20/2023  RP, OTR/L  Acute Rehabilitation Services  Office:  [phone]   Suzanna Obey 01/20/2023, 9:16 AM

## 2023-02-22 ENCOUNTER — Other Ambulatory Visit: Payer: Self-pay

## 2023-02-22 ENCOUNTER — Emergency Department (HOSPITAL_COMMUNITY): Payer: Medicare HMO

## 2023-02-22 ENCOUNTER — Encounter (HOSPITAL_COMMUNITY): Payer: Self-pay

## 2023-02-22 ENCOUNTER — Emergency Department (HOSPITAL_COMMUNITY)
Admission: EM | Admit: 2023-02-22 | Discharge: 2023-02-22 | Disposition: A | Discharge status: Home / Self Care | Payer: Medicare HMO | Attending: Emergency Medicine | Admitting: Emergency Medicine

## 2023-02-22 DIAGNOSIS — E039 Hypothyroidism, unspecified: Secondary | ICD-10-CM | POA: Diagnosis not present

## 2023-02-22 DIAGNOSIS — M5442 Lumbago with sciatica, left side: Secondary | ICD-10-CM | POA: Diagnosis not present

## 2023-02-22 DIAGNOSIS — Z96611 Presence of right artificial shoulder joint: Secondary | ICD-10-CM | POA: Diagnosis not present

## 2023-02-22 DIAGNOSIS — Z79899 Other long term (current) drug therapy: Secondary | ICD-10-CM | POA: Diagnosis not present

## 2023-02-22 DIAGNOSIS — J45909 Unspecified asthma, uncomplicated: Secondary | ICD-10-CM | POA: Insufficient documentation

## 2023-02-22 DIAGNOSIS — M545 Low back pain, unspecified: Secondary | ICD-10-CM | POA: Diagnosis present

## 2023-02-22 DIAGNOSIS — I10 Essential (primary) hypertension: Secondary | ICD-10-CM | POA: Insufficient documentation

## 2023-02-22 DIAGNOSIS — Z96612 Presence of left artificial shoulder joint: Secondary | ICD-10-CM | POA: Insufficient documentation

## 2023-02-22 DIAGNOSIS — Z7982 Long term (current) use of aspirin: Secondary | ICD-10-CM | POA: Insufficient documentation

## 2023-02-22 DIAGNOSIS — Z7984 Long term (current) use of oral hypoglycemic drugs: Secondary | ICD-10-CM | POA: Insufficient documentation

## 2023-02-22 DIAGNOSIS — E119 Type 2 diabetes mellitus without complications: Secondary | ICD-10-CM | POA: Insufficient documentation

## 2023-02-22 LAB — COMPREHENSIVE METABOLIC PANEL
ALT: 27 U/L (ref 0–44)
AST: 18 U/L (ref 15–41)
Albumin: 4.4 g/dL (ref 3.5–5.0)
Alkaline Phosphatase: 119 U/L (ref 38–126)
Anion gap: 9 (ref 5–15)
BUN: 18 mg/dL (ref 8–23)
CO2: 27 mmol/L (ref 22–32)
Calcium: 9.7 mg/dL (ref 8.9–10.3)
Chloride: 104 mmol/L (ref 98–111)
Creatinine, Ser: 0.64 mg/dL (ref 0.44–1.00)
GFR, Estimated: >60 mL/min (ref >60)
Glucose, Bld: 133 mg/dL — ABNORMAL HIGH (ref 70–99)
Potassium: 3.9 mmol/L (ref 3.5–5.1)
Sodium: 140 mmol/L (ref 135–145)
Total Bilirubin: 0.7 mg/dL (ref 0.3–1.2)
Total Protein: 7.1 g/dL (ref 6.5–8.1)

## 2023-02-22 LAB — SEDIMENTATION RATE: Sed Rate: 7 mm/h (ref 0–22)

## 2023-02-22 LAB — CBC WITH DIFFERENTIAL/PLATELET
Abs Immature Granulocytes: 0.04 10*3/uL (ref 0.00–0.07)
Basophils Absolute: 0.1 10*3/uL (ref 0.0–0.1)
Basophils Relative: 1 %
Eosinophils Absolute: 0.1 10*3/uL (ref 0.0–0.5)
Eosinophils Relative: 1 %
HCT: 39.9 % (ref 36.0–46.0)
Hemoglobin: 12.9 g/dL (ref 12.0–15.0)
Immature Granulocytes: 1 %
Lymphocytes Relative: 17 %
Lymphs Abs: 1.4 10*3/uL (ref 0.7–4.0)
MCH: 29.7 pg (ref 26.0–34.0)
MCHC: 32.3 g/dL (ref 30.0–36.0)
MCV: 91.9 fL (ref 80.0–100.0)
Monocytes Absolute: 0.6 10*3/uL (ref 0.1–1.0)
Monocytes Relative: 8 %
Neutro Abs: 5.9 10*3/uL (ref 1.7–7.7)
Neutrophils Relative %: 72 %
Platelets: 177 10*3/uL (ref 150–400)
RBC: 4.34 MIL/uL (ref 3.87–5.11)
RDW: 12.9 % (ref 11.5–15.5)
WBC: 8.1 10*3/uL (ref 4.0–10.5)
nRBC: 0 % (ref 0.0–0.2)

## 2023-02-22 MED ORDER — CYCLOBENZAPRINE HCL 10 MG PO TABS
10.0000 mg | ORAL_TABLET | Freq: Two times a day (BID) | ORAL | 0 refills | Status: AC | PRN
Start: 2023-02-22 — End: 2023-02-27

## 2023-02-22 MED ORDER — FENTANYL CITRATE PF 50 MCG/ML IJ SOSY
50.0000 ug | PREFILLED_SYRINGE | Freq: Once | INTRAMUSCULAR | Status: AC
  Administered 2023-02-22: 50 ug via INTRAVENOUS
  Filled 2023-02-22: qty 1

## 2023-02-22 MED ORDER — KETOROLAC TROMETHAMINE 15 MG/ML IJ SOLN
15.0000 mg | Freq: Once | INTRAMUSCULAR | Status: AC
  Administered 2023-02-22: 15 mg via INTRAVENOUS
  Filled 2023-02-22: qty 1

## 2023-02-22 MED ORDER — GADOBUTROL 1 MMOL/ML IV SOLN
7.0000 mL | Freq: Once | INTRAVENOUS | Status: AC | PRN
  Administered 2023-02-22: 7 mL via INTRAVENOUS

## 2023-02-22 MED ORDER — CYCLOBENZAPRINE HCL 10 MG PO TABS
10.0000 mg | ORAL_TABLET | Freq: Once | ORAL | Status: AC
  Administered 2023-02-22: 10 mg via ORAL
  Filled 2023-02-22: qty 1

## 2023-02-22 MED ORDER — LIDOCAINE 5 % EX PTCH
1.0000 | MEDICATED_PATCH | CUTANEOUS | Status: DC
Start: 2023-02-22 — End: 2023-02-23
  Administered 2023-02-22: 1 via TRANSDERMAL
  Filled 2023-02-22: qty 1

## 2023-02-22 NOTE — ED Notes (Signed)
Patient transported to MRI 

## 2023-02-22 NOTE — ED Triage Notes (Signed)
Patient had lower back surgery in September and has developed lower back pain the past few weeks with radiation to feet, denies trauma.

## 2023-02-22 NOTE — ED Provider Notes (Signed)
Accepted handoff at shift change from Jeanelle Malling PA-C. Please see prior provider note for more detail.   Briefly: Patient is 72 y.o. " lumbar laminectomy on 01/19/2023 by Dr. Yetta Barre, hypertension, hypothyroidism, migraines, RA present today for evaluation of back pain.  Patient reports increased back pain in the past 2 weeks.  Pain is in her lower back, radiates to her left leg.  Pain is intermittent after surgery but has gotten worse in the past 2 weeks.  Patient has tried oxycodone at home with no relief.  She reports urinary incontinence after the surgery.  She denies any fever, cold/chills, nausea, vomiting, abdominal pain, bowel change, urinary symptoms."  DDX: concern for fracture, dislocation, postsurgical changes, cauda equina   Plan:  - patient with hx of PLIF on 01/19/2023 with Dr. Yetta Barre. Was healing well until 2 weeks ago. No recent back trauma. Home medications are not helping pain. Patient stating that she has had urinary incontinence - but this has been present for years and is being managed by outpatient GYN. Neuro exam with sensation to light touch intact, +2 pedal pulses BL, ROM intact - less concerning for cauda equina. Norco 5mg  has not been touching pain at home.  - consulting neurosurgery given uncontrolled pain and MRI showing suspected nondisplaced S2 fracture.  - Consulted with Dr. Franky Macho who recommended TSLO brace and outpatient management by her current provider Dr. Yetta Barre. He will notify Dr. Yetta Barre that his patient was seen in the ED. - shared recommendations with patient. Patient agrees to plan. Patient asking for a lidocaine patch and new prescription for muscle relaxers since she ran out.  - patient afebrile with stable vitals. Provided with return precautions. Discharged in good condition.     Dorthy Cooler, [place] 02/22/23 1936    Pricilla Loveless, MD 02/26/23 [phone]

## 2023-02-22 NOTE — Discharge Instructions (Addendum)
It was a pleasure caring for you today. As discussed, you will need to follow up with your neursurgeon tomorrow morning. I have sent your new prescription for muscle relaxer to your pharmacy. Seek emergency care if experiencing any new or worsening symptoms.

## 2023-02-22 NOTE — ED Provider Notes (Signed)
[facility] AT [facility] Provider Note   CSN: [id number] Arrival date & time: 02/22/23  1025     History  No chief complaint on file.   Jessica Cole is a 72 y.o. female with a past medical history significant for lumbar laminectomy on 01/19/2023 by Dr. Yetta Barre, hypertension, hypothyroidism, migraines, RA present today for evaluation of back pain.  Patient reports increased back pain in the past 2 weeks.  Pain is in her lower back, radiates to her left leg.  Pain is intermittent after surgery but has gotten worse in the past 2 weeks.  Patient has tried oxycodone at home with no relief.  She reports urinary incontinence after the surgery.  She denies any fever, cold/chills, nausea, vomiting, abdominal pain, bowel change, urinary symptoms.  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   a.) preoperative PCR (+) on 02/14/2021 prior to LUMBAR MICRODISCECTOMY; b.) preoperative PCR (+) 06/24/2022 prior to REVERSE SHOULDER ARTHROPLASTY W/ BICEPS TENODESIS   Obesity    OSA on CPAP    Plantar fasciitis    right   Pneumonia    Renal calculi    Rheumatoid arthritis (HCC)    RLS (restless legs syndrome) 08/23/2014   T2DM (type 2 diabetes mellitus) (HCC)    Past Surgical History:  Procedure Laterality Date   ABDOMINAL HYSTERECTOMY     partial   APPENDECTOMY     Arthroscopic surgery, knee Left    BREAST BIOPSY Right    several   BREAST BIOPSY Right 03/11/2017   u/s bx neg   BUNIONECTOMY     LEFT    COLONOSCOPY W/ POLYPECTOMY      COLONOSCOPY WITH PROPOFOL N/A 08/14/2017   Procedure: COLONOSCOPY WITH PROPOFOL;  Surgeon: Christena Deem, MD;  Location: [facility] ENDOSCOPY;  Service: Endoscopy;  Laterality: N/A;   EXTRACORPOREAL SHOCK WAVE LITHOTRIPSY Left 05/14/2017   Procedure: EXTRACORPOREAL SHOCK WAVE LITHOTRIPSY (ESWL);  Surgeon: Riki Altes, MD;  Location: [facility] ORS;  Service: Urology;  Laterality: Left;   EXTRACORPOREAL SHOCK WAVE LITHOTRIPSY Left 06/14/2020   Procedure: EXTRACORPOREAL SHOCK WAVE LITHOTRIPSY (ESWL);  Surgeon: Sondra Come, MD;  Location: [facility] ORS;  Service: Urology;  Laterality: Left;   FINGER SURGERY Right    FOOT SURGERY     RIGHT     KNEE ARTHROSCOPY W/ MENISCAL REPAIR Left    LASIK     LUMBAR LAMINECTOMY/DECOMPRESSION MICRODISCECTOMY Right 03/01/2021   Procedure: Microdiscectomy - Lumbar five-Sacral one - right;  Surgeon: Tia Alert, MD;  Location: [facility] OR;  Service: Neurosurgery;  Laterality: Right;   MAXIMUM ACCESS (MAS)POSTERIOR LUMBAR INTERBODY FUSION (PLIF) 1 LEVEL N/A 04/25/2016   Procedure: LUMBAR FOUR-FIVE  MAXIMUM ACCESS (MAS) POSTERIOR LUMBAR INTERBODY FUSION (PLIF) with extension of instrumentation LUMBAR TWO-FIVE;  Surgeon: Tia Alert, MD;  Location: [facility] OR;  Service: Neurosurgery;  Laterality: N/A;   MAXIMUM ACCESS (MAS)POSTERIOR LUMBAR INTERBODY FUSION (PLIF)  2 LEVEL N/A 12/13/2015   Procedure: Lumbar two-three - Lumbar three-four MAXIMUM ACCESS (MAS) POSTERIOR LUMBAR INTERBODY FUSION (PLIF)  ;  Surgeon: Tia Alert, MD;  Location: [facility] NEURO ORS;  Service: Neurosurgery;  Laterality: N/A;   REVERSE SHOULDER ARTHROPLASTY Left 03/01/2020   Procedure: REVERSE SHOULDER ARTHROPLASTY;  Surgeon: Christena Flake, MD;  Location: [facility] ORS;  Service: Orthopedics;  Laterality: Left;   REVERSE SHOULDER ARTHROPLASTY Right 07/03/2022   Procedure: REVERSE SHOULDER ARTHROPLASTY W/ BICEPS TENODESIS.;  Surgeon: Christena Flake, MD;  Location: [facility] ORS;  Service: Orthopedics;  Laterality: Right;    SHOULDER SURGERY     RIGHT    TONSILLECTOMY       Home Medications Prior to Admission medications   Medication Sig Start Date End Date Taking? Authorizing Provider  alendronate (FOSAMAX) 70 MG tablet Take 70 mg by mouth every 7 (seven) days. Take with a full glass of water on an empty stomach.    [provider]  Apoaequorin (PREVAGEN PO) Take 1 capsule by mouth daily.    [provider]  aspirin 81 MG EC tablet Take 81 mg by mouth at bedtime.    [provider]  DULoxetine (CYMBALTA) 60 MG capsule Take 60 mg by mouth daily.    [provider]  empagliflozin (JARDIANCE) 10 MG TABS tablet Take 5 mg by mouth 2 (two) times daily as needed (high blood sugar).    [provider]  gabapentin (NEURONTIN) 800 MG tablet Take 1 tablet (800 mg total) by mouth 2 (two) times daily. Takes 400 mg in daytime and 800 mg at bedtime Patient not taking: Reported on 01/01/2023 07/03/22   Poggi, Excell Seltzer, MD  HYDROcodone-acetaminophen (NORCO/VICODIN) 5-325 MG tablet Take 1 tablet by mouth every 4 (four) hours as needed for moderate pain or severe pain. 01/20/23   Arman Bogus, MD  hydroxychloroquine (PLAQUENIL) 200 MG tablet Take 200 mg by mouth 2 (two) times daily.    [provider]  levothyroxine (SYNTHROID, LEVOTHROID) 112 MCG tablet Take 112 mcg by mouth daily before breakfast.  11/07/15   [provider]  meloxicam (MOBIC) 7.5 MG tablet Take 7.5 mg by mouth daily.    [provider]  metoprolol tartrate (LOPRESSOR) 100 MG tablet Take tablet (100mg ) TWO hours prior to your cardiac CT scan. Patient not taking: Reported on 01/01/2023 10/24/22   Debbe Odea, MD  mirabegron ER (MYRBETRIQ) 25 MG TB24 tablet Take 1 tablet by mouth daily. 12/16/22   [provider]  NON FORMULARY Pt uses a cpap nightly    [provider]  pravastatin (PRAVACHOL) 20 MG tablet Take 20 mg by mouth at bedtime.    [provider]   rOPINIRole (REQUIP) 4 MG tablet Take 4 mg by mouth 2 (two) times daily. 01/14/22   [provider]  telmisartan (MICARDIS) 80 MG tablet Take 80 mg by mouth daily.    [provider]  tiZANidine (ZANAFLEX) 2 MG tablet Take 1 tablet (2 mg total) by mouth every 6 (six) hours as needed for muscle spasms. 01/20/23   Arman Bogus, MD      Allergies    Diazepam and Dexamethasone    Review of Systems   Review of Systems Negative except as per HPI.  Physical Exam Updated Vital Signs BP (!) 142/75   Pulse 70   Temp 98.2 F (36.8 C)   Resp 18   SpO2 98%  Physical Exam Vitals and nursing note reviewed.  Constitutional:  Appearance: Normal appearance.  HENT:     Head: Normocephalic and atraumatic.     Mouth/Throat:     Mouth: Mucous membranes are moist.  Eyes:     General: No scleral icterus. Cardiovascular:     Rate and Rhythm: Normal rate and regular rhythm.     Pulses: Normal pulses.     Heart sounds: Normal heart sounds.  Pulmonary:     Effort: Pulmonary effort is normal.     Breath sounds: Normal breath sounds.  Abdominal:     General: Abdomen is flat.     Palpations: Abdomen is soft.     Tenderness: There is no abdominal tenderness.  Musculoskeletal:        General: No deformity.     Comments: LSO brace in place.   Skin:    General: Skin is warm.     Findings: No rash.  Neurological:     General: No focal deficit present.     Mental Status: She is alert.  Psychiatric:        Mood and Affect: Mood normal.     ED Results / Procedures / Treatments   Labs (all labs ordered are listed, but only abnormal results are displayed) Labs Reviewed  COMPREHENSIVE METABOLIC PANEL - Abnormal; Notable for the following components:      Result Value   Glucose, Bld 133 (*)    All other components within normal limits  CBC WITH DIFFERENTIAL/PLATELET  SEDIMENTATION RATE    EKG None  Radiology No results found.  Procedures Procedures     Medications Ordered in ED Medications  fentaNYL (SUBLIMAZE) injection 50 mcg (has no administration in time range)  cyclobenzaprine (FLEXERIL) tablet 10 mg (has no administration in time range)  ketorolac (TORADOL) 15 MG/ML injection 15 mg (has no administration in time range)    ED Course/ Medical Decision Making/ A&P                                 Medical Decision Making Risk Prescription drug management.   This patient presents to the ED for lower back pain, this involves an extensive number of treatment options, and is a complaint that carries with a high risk of complications and morbidity.  The differential diagnosis includes fracture, dislocation, postsurgical changes, cauda equina.  This is not an exhaustive list.  Lab tests: I ordered and personally interpreted labs.  The pertinent results include: WBC unremarkable. Hbg unremarkable. Platelets unremarkable. Electrolytes unremarkable. BUN, creatinine unremarkable.  Sed rate is normal.  Imaging studies: MRI lumbar spine ordered and pending.  Problem list/ ED course/ Critical interventions/ Medical management: HPI: See above Vital signs within normal range and stable throughout visit. Laboratory/imaging studies significant for: See above. On physical examination, patient is afebrile and appears uncomfortable due to pain. Concern for infection, cauda equina with urinary retention, post surgical changes, etc so MRI lumbar spine was ordered and pending.  Given Flexeril, fentanyl and Toradol for pain.  Patient reports some improvement with pain medications. I have reviewed the patient home medicines and have made adjustments as needed.  Cardiac monitoring/EKG: The patient was maintained on a cardiac monitor.  I personally reviewed and interpreted the cardiac monitor which showed an underlying rhythm of: sinus rhythm.  Additional history obtained: External records from outside source obtained and reviewed including: Chart  review including previous notes, labs, imaging.  Consultations obtained:  Disposition Patient care signed out at shift change  to [facility] with pending MRI results.  This chart was dictated using voice recognition software.  Despite best efforts to proofread,  errors can occur which can change the documentation meaning.          Final Clinical Impression(s) / ED Diagnoses Final diagnoses:  Acute left-sided low back pain with left-sided sciatica    Rx / DC Orders ED Discharge Orders     None         Jeanelle Malling, [place] 02/22/23 1515    Pricilla Loveless, MD 02/26/23 [phone]

## 2023-02-22 NOTE — ED Provider Triage Note (Signed)
Emergency Medicine Provider Triage Evaluation Note  Jessica Cole , a 72 y.o. female  was evaluated in triage.  Pt complains of low backx2 weeks.Had lumbar fusion 1 month ago, was improved for first 2 weeks, now worse. Pain goes from bilateral lower back to ankles. Worse with walking. No fever, chills, no loss of bowel or bladder.   Review of Systems  Positive: Low back pain Negative: fevers  Physical Exam  BP (!) 142/75   Pulse 70   Temp 98.2 F (36.8 C)   Resp 18   SpO2 98%  Gen:   Awake, no distress   Resp:  Normal effort  MSK:   Moves extremities without difficulty  Other:  TTP of midline lumbar spine, +SLR bilaterally  Medical Decision Making  Medically screening exam initiated at 11:12 AM.  Appropriate orders placed.  [name] was informed that the remainder of the evaluation will be completed by another provider, this initial triage assessment does not replace that evaluation, and the importance of remaining in the ED until their evaluation is complete.    Pete Pelt, [place] 02/22/23 1115

## 2023-06-04 ENCOUNTER — Ambulatory Visit: Payer: Medicare HMO | Admitting: Sports Medicine

## 2023-10-03 ENCOUNTER — Emergency Department

## 2023-10-03 ENCOUNTER — Other Ambulatory Visit: Payer: Self-pay

## 2023-10-03 ENCOUNTER — Emergency Department
Admission: EM | Admit: 2023-10-03 | Discharge: 2023-10-03 | Disposition: A | Discharge status: Home / Self Care | Attending: Emergency Medicine | Admitting: Emergency Medicine

## 2023-10-03 DIAGNOSIS — R109 Unspecified abdominal pain: Secondary | ICD-10-CM | POA: Diagnosis present

## 2023-10-03 DIAGNOSIS — E119 Type 2 diabetes mellitus without complications: Secondary | ICD-10-CM | POA: Diagnosis not present

## 2023-10-03 DIAGNOSIS — I10 Essential (primary) hypertension: Secondary | ICD-10-CM | POA: Insufficient documentation

## 2023-10-03 LAB — BASIC METABOLIC PANEL WITH GFR
Anion gap: 8 (ref 5–15)
BUN: 23 mg/dL (ref 8–23)
CO2: 25 mmol/L (ref 22–32)
Calcium: 9.3 mg/dL (ref 8.9–10.3)
Chloride: 104 mmol/L (ref 98–111)
Creatinine, Ser: 0.78 mg/dL (ref 0.44–1.00)
GFR, Estimated: >60 mL/min (ref >60)
Glucose, Bld: 125 mg/dL — ABNORMAL HIGH (ref 70–99)
Potassium: 4.9 mmol/L (ref 3.5–5.1)
Sodium: 137 mmol/L (ref 135–145)

## 2023-10-03 LAB — CBC WITH DIFFERENTIAL/PLATELET
Abs Immature Granulocytes: 0.01 10*3/uL (ref 0.00–0.07)
Basophils Absolute: 0.1 10*3/uL (ref 0.0–0.1)
Basophils Relative: 1 %
Eosinophils Absolute: 0.1 10*3/uL (ref 0.0–0.5)
Eosinophils Relative: 2 %
HCT: 43.4 % (ref 36.0–46.0)
Hemoglobin: 14.6 g/dL (ref 12.0–15.0)
Immature Granulocytes: 0 %
Lymphocytes Relative: 21 %
Lymphs Abs: 1.3 10*3/uL (ref 0.7–4.0)
MCH: 30.3 pg (ref 26.0–34.0)
MCHC: 33.6 g/dL (ref 30.0–36.0)
MCV: 90 fL (ref 80.0–100.0)
Monocytes Absolute: 0.5 10*3/uL (ref 0.1–1.0)
Monocytes Relative: 9 %
Neutro Abs: 4 10*3/uL (ref 1.7–7.7)
Neutrophils Relative %: 67 %
Platelets: 193 10*3/uL (ref 150–400)
RBC: 4.82 MIL/uL (ref 3.87–5.11)
RDW: 12.5 % (ref 11.5–15.5)
WBC: 6 10*3/uL (ref 4.0–10.5)
nRBC: 0 % (ref 0.0–0.2)

## 2023-10-03 LAB — URINALYSIS, ROUTINE W REFLEX MICROSCOPIC
Bilirubin Urine: NEGATIVE
Glucose, UA: NEGATIVE mg/dL
Hgb urine dipstick: NEGATIVE
Ketones, ur: NEGATIVE mg/dL
Leukocytes,Ua: NEGATIVE
Nitrite: NEGATIVE
Protein, ur: NEGATIVE mg/dL
Specific Gravity, Urine: 1.021 (ref 1.005–1.030)
pH: 5 (ref 5.0–8.0)

## 2023-10-03 MED ORDER — ONDANSETRON HCL 4 MG/2ML IJ SOLN
4.0000 mg | Freq: Once | INTRAMUSCULAR | Status: AC
Start: 2023-10-03 — End: 2023-10-03
  Administered 2023-10-03: 4 mg via INTRAVENOUS
  Filled 2023-10-03: qty 2

## 2023-10-03 MED ORDER — HYDROMORPHONE HCL 1 MG/ML IJ SOLN
0.5000 mg | Freq: Once | INTRAMUSCULAR | Status: AC
  Administered 2023-10-03: 0.5 mg via INTRAVENOUS
  Filled 2023-10-03: qty 0.5

## 2023-10-03 MED ORDER — TAMSULOSIN HCL 0.4 MG PO CAPS
0.4000 mg | ORAL_CAPSULE | Freq: Once | ORAL | Status: AC
  Administered 2023-10-03: 0.4 mg via ORAL
  Filled 2023-10-03: qty 1

## 2023-10-03 MED ORDER — KETOROLAC TROMETHAMINE 30 MG/ML IJ SOLN
15.0000 mg | Freq: Once | INTRAMUSCULAR | Status: AC
Start: 2023-10-03 — End: 2023-10-03
  Administered 2023-10-03: 15 mg via INTRAVENOUS
  Filled 2023-10-03 (×2): qty 1

## 2023-10-03 MED ORDER — LIDOCAINE 5 % EX PTCH
1.0000 | MEDICATED_PATCH | CUTANEOUS | Status: DC
  Administered 2023-10-03: 1 via TRANSDERMAL
  Filled 2023-10-03: qty 1

## 2023-10-03 MED ORDER — LACTATED RINGERS IV BOLUS
1000.0000 mL | Freq: Once | INTRAVENOUS | Status: AC
  Administered 2023-10-03: 1000 mL via INTRAVENOUS

## 2023-10-03 MED ORDER — LIDOCAINE 5 % EX PTCH: 1.0000 | MEDICATED_PATCH | Freq: Two times a day (BID) | CUTANEOUS | 1 refills | Status: AC

## 2023-10-03 NOTE — ED Notes (Signed)
 Pt sent over from [facility] for left flank pain with hx of kidney stones

## 2023-10-03 NOTE — ED Notes (Addendum)
 Pt taken to CT.

## 2023-10-03 NOTE — ED Notes (Signed)
 Pt returned from CT

## 2023-10-03 NOTE — ED Triage Notes (Signed)
 Pt c/o left flank pain x2 days with hx of kidney stones and hypertension. Pt sent over from [facility] for imaging

## 2023-10-03 NOTE — ED Provider Notes (Signed)
 [facility] Provider Note    Event Date/Time   First MD Initiated Contact with Patient 10/03/23 1038     (approximate)   History   Flank Pain   HPI  Jessica Cole is a 73 y.o. female who presents to the ED for evaluation of Flank Pain   Review of PCP visit from January.  Routine annual exam.  History of HTN, HLD, DM, anxiety and RA.  History of kidney stones and lithotripsy in the past.  Patient presents to the ED alongside her husband for evaluation of acute left-sided flank pain consistent with previous episodes of ureteral stones.   Physical Exam   Triage Vital Signs: ED Triage Vitals  Encounter Vitals Group     BP 10/03/23 1037 124/83     Systolic BP Percentile --      Diastolic BP Percentile --      Pulse Rate 10/03/23 1037 97     Resp 10/03/23 1037 16     Temp 10/03/23 1037 98 F (36.7 C)     Temp Source 10/03/23 1037 Oral     SpO2 10/03/23 1037 98 %     Weight --      Height 10/03/23 1038 4\' 11"  (1.499 m)     Head Circumference --      Peak Flow --      Pain Score 10/03/23 1038 8     Pain Loc --      Pain Education --      Exclude from Growth Chart --     Most recent vital signs: Vitals:   10/03/23 1037  BP: 124/83  Pulse: 97  Resp: 16  Temp: 98 F (36.7 C)  SpO2: 98%    General: Awake, no distress.  Seems uncomfortable CV:  Good peripheral perfusion.  Resp:  Normal effort.  Abd:  No distention.  Benign anterior abdomen without guarding or peritoneal features MSK:  No deformity noted.  Neuro:  No focal deficits appreciated. Other:     ED Results / Procedures / Treatments   Labs (all labs ordered are listed, but only abnormal results are displayed) Labs Reviewed  BASIC METABOLIC PANEL WITH GFR - Abnormal; Notable for the following components:      Result Value   Glucose, Bld 125 (*)    All other components within normal limits  URINALYSIS, ROUTINE W REFLEX MICROSCOPIC - Abnormal; Notable for the following  components:   Color, Urine YELLOW (*)    APPearance CLEAR (*)    All other components within normal limits  CBC WITH DIFFERENTIAL/PLATELET    EKG   RADIOLOGY CT renal study interpreted by me without evidence of acute pathology.  Official radiology report(s): CT Renal Stone Study Result Date: 10/03/2023 CLINICAL DATA:  Left flank pain EXAM: CT ABDOMEN AND PELVIS WITHOUT CONTRAST TECHNIQUE: Multidetector CT imaging of the abdomen and pelvis was performed following the standard protocol without IV contrast. RADIATION DOSE REDUCTION: This exam was performed according to the departmental dose-optimization program which includes automated exposure control, adjustment of the mA and/or kV according to patient size and/or use of iterative reconstruction technique. COMPARISON:  06/05/2020. FINDINGS: Lower chest: No acute abnormality. No pericardial or pleural effusions. There is a small hiatal hernia. Hepatobiliary: No focal liver abnormality is seen. No gallstones, gallbladder wall thickening, or biliary dilatation. Pancreas: Unremarkable. No pancreatic ductal dilatation or surrounding inflammatory changes. Spleen: Normal in size without focal abnormality. Adrenals/Urinary Tract: No adrenal lesions. There are few punctate mm  sized stones right kidney. No hydronephrosis. Unremarkable urinary bladder. Stomach/Bowel: Stomach is within normal limits. Appendix not visualized and no evidence of appendicitis. No evidence of bowel wall thickening, distention, or inflammatory changes. Diffuse scattered colonic diverticula. Vascular/Lymphatic: Aortic atherosclerosis. No enlarged abdominal or pelvic lymph nodes. Reproductive: Postop hysterectomy.Left adnexal 3.5 cm cyst. Other: No abdominal wall hernia or abnormality. No abdominopelvic ascites. Musculoskeletal: L2-S1 posterior fusion and discectomies. IMPRESSION: 1. Right-sided nephrolithiasis. 2. Diverticulosis. 3. Left adnexal cyst. 4. Aortic atherosclerosis  (ICD10-I70.0). Electronically Signed   By: Sydell Eva M.D.   On: 10/03/2023 11:57    PROCEDURES and INTERVENTIONS:  Procedures  Medications  lidocaine  (LIDODERM ) 5 % 1 patch (1 patch Transdermal Patch Applied 10/03/23 1228)  lactated ringers  bolus 1,000 mL (0 mLs Intravenous Stopped 10/03/23 1324)  tamsulosin  (FLOMAX ) capsule 0.4 mg (0.4 mg Oral Given 10/03/23 1057)  ondansetron  (ZOFRAN ) injection 4 mg (4 mg Intravenous Given 10/03/23 1057)  HYDROmorphone  (DILAUDID ) injection 0.5 mg (0.5 mg Intravenous Given 10/03/23 1057)  ketorolac  (TORADOL ) 30 MG/ML injection 15 mg (15 mg Intravenous Given 10/03/23 1324)     IMPRESSION / MDM / ASSESSMENT AND PLAN / ED COURSE  I reviewed the triage vital signs and the nursing notes.  Differential diagnosis includes, but is not limited to, ureteral stone, UTI or pyelonephritis, muscular spasm, shingles, sciatica  {Patient presents with symptoms of an acute illness or injury that is potentially life-threatening.  Patient presents with left flank pain without evidence of acute pathology and suitable for outpatient management with close PCP follow-up.  Reassuring vital signs and exams.  Has some mild localized tenderness over the left flank that is quite low down, left-sided paraspinal back and possibly related to muscular etiology such as sciatica versus shingles prior to rash eruption.  Her UA is clear and she has reassuring workup with normal CBC, metabolic panel and CT without clear signs of underlying pathology.  Clinical Course as of 10/03/23 1335  Sat Oct 03, 2023  1213 Reassessed.  Improving pain.  Discussed reassuring workup so far, pending UA and possible etiologies of her symptoms [DS]  1332 Reassessed, feeling well.  Pain is controlled, we discussed reassuring UA and overall workup.  Discussed care at home and ED return precautions.  She is appreciative [DS]    Clinical Course User Index [DS] Arline Bennett, MD     FINAL CLINICAL  IMPRESSION(S) / ED DIAGNOSES   Final diagnoses:  Left flank pain     Rx / DC Orders   ED Discharge Orders          Ordered    lidocaine  (LIDODERM ) 5 %  Every 12 hours        10/03/23 1333             Note:  This document was prepared using Dragon voice recognition software and may include unintentional dictation errors.   Arline Bennett, MD 10/03/23 [phone]

## 2023-10-03 NOTE — Discharge Instructions (Addendum)
 Use Tylenol  for pain and fevers.  Up to 1000 mg per dose, up to 4 times per day.  Do not take more than 4000 mg of Tylenol /acetaminophen  within 24 hours..  Please use lidocaine  patches at your site of pain.  Apply 1 patch at a time, leave on for 12 hours, then remove for 12 hours.  12 hours on, 12 hours off.  Do not apply more than 1 patch at a time.  Follow-up with Dr. Rodolfo Clan, but return to the ED with any worsening symptoms despite these measures
